# Patient Record
Sex: Male | Born: 1980 | Race: White | Hispanic: No | Smoking: Current every day smoker
Health system: Southern US, Community
[De-identification: ages and names within clinical notes are randomized; demographics above are authoritative.]

## PROBLEM LIST (undated history)

## (undated) DIAGNOSIS — B019 Varicella without complication: Secondary | ICD-10-CM

## (undated) DIAGNOSIS — E119 Type 2 diabetes mellitus without complications: Secondary | ICD-10-CM

## (undated) DIAGNOSIS — I2510 Atherosclerotic heart disease of native coronary artery without angina pectoris: Secondary | ICD-10-CM

## (undated) DIAGNOSIS — E7800 Pure hypercholesterolemia, unspecified: Secondary | ICD-10-CM

## (undated) DIAGNOSIS — I639 Cerebral infarction, unspecified: Secondary | ICD-10-CM

## (undated) DIAGNOSIS — Z720 Tobacco use: Secondary | ICD-10-CM

## (undated) DIAGNOSIS — I2119 ST elevation (STEMI) myocardial infarction involving other coronary artery of inferior wall: Secondary | ICD-10-CM

## (undated) HISTORY — DX: Tobacco use: Z72.0

## (undated) HISTORY — PX: CORONARY ANGIOPLASTY WITH STENT PLACEMENT: SHX49

## (undated) HISTORY — DX: Varicella without complication: B01.9

## (undated) HISTORY — DX: Atherosclerotic heart disease of native coronary artery without angina pectoris: I25.10

---
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
## 2004-11-02 ENCOUNTER — Emergency Department: Payer: Self-pay | Admitting: Emergency Medicine

## 2005-04-06 ENCOUNTER — Emergency Department: Payer: Self-pay | Admitting: Emergency Medicine

## 2005-04-07 ENCOUNTER — Other Ambulatory Visit: Payer: Self-pay

## 2006-05-21 ENCOUNTER — Emergency Department: Payer: Self-pay | Admitting: Emergency Medicine

## 2006-05-23 ENCOUNTER — Emergency Department: Payer: Self-pay | Admitting: Emergency Medicine

## 2006-10-12 ENCOUNTER — Emergency Department: Payer: Self-pay | Admitting: Emergency Medicine

## 2007-05-28 ENCOUNTER — Emergency Department (HOSPITAL_COMMUNITY): Admission: EM | Admit: 2007-05-28 | Discharge: 2007-05-28 | Payer: Self-pay | Admitting: Emergency Medicine

## 2008-02-16 ENCOUNTER — Emergency Department: Payer: Self-pay | Admitting: Emergency Medicine

## 2008-07-30 ENCOUNTER — Emergency Department: Payer: Self-pay | Admitting: Emergency Medicine

## 2008-10-24 ENCOUNTER — Emergency Department: Payer: Self-pay | Admitting: Emergency Medicine

## 2008-12-26 ENCOUNTER — Emergency Department: Payer: Self-pay | Admitting: Emergency Medicine

## 2010-07-22 ENCOUNTER — Emergency Department: Payer: Self-pay | Admitting: Unknown Physician Specialty

## 2010-09-22 ENCOUNTER — Emergency Department: Payer: Self-pay | Admitting: *Deleted

## 2010-10-29 ENCOUNTER — Emergency Department: Payer: Self-pay | Admitting: *Deleted

## 2010-11-01 ENCOUNTER — Emergency Department: Payer: Self-pay | Admitting: Emergency Medicine

## 2012-02-07 ENCOUNTER — Emergency Department: Payer: Self-pay | Admitting: Emergency Medicine

## 2012-02-11 LAB — WOUND CULTURE

## 2012-08-06 ENCOUNTER — Emergency Department: Payer: Self-pay | Admitting: Emergency Medicine

## 2012-08-08 ENCOUNTER — Observation Stay: Payer: Self-pay | Admitting: Surgery

## 2012-08-08 LAB — CREATININE, SERUM
EGFR (African American): >60
EGFR (Non-African Amer.): >60

## 2012-08-12 LAB — WOUND CULTURE

## 2012-08-28 ENCOUNTER — Encounter (HOSPITAL_COMMUNITY)
Admission: EM | Disposition: A | Discharge status: Home / Self Care | Payer: Self-pay | Source: Ambulatory Visit | Attending: Cardiology

## 2012-08-28 ENCOUNTER — Inpatient Hospital Stay (HOSPITAL_COMMUNITY)
Admission: EM | Admit: 2012-08-28 | Discharge: 2012-08-30 | DRG: 249 | Disposition: A | Discharge status: Home / Self Care | Payer: Medicaid Other | Source: Ambulatory Visit | Attending: Cardiology | Admitting: Cardiology

## 2012-08-28 ENCOUNTER — Observation Stay (HOSPITAL_COMMUNITY): Admission: EM | Admit: 2012-08-28 | Payer: Self-pay | Source: Ambulatory Visit | Admitting: Cardiology

## 2012-08-28 ENCOUNTER — Encounter (HOSPITAL_COMMUNITY): Payer: Self-pay | Admitting: *Deleted

## 2012-08-28 DIAGNOSIS — Z7982 Long term (current) use of aspirin: Secondary | ICD-10-CM

## 2012-08-28 DIAGNOSIS — I213 ST elevation (STEMI) myocardial infarction of unspecified site: Secondary | ICD-10-CM

## 2012-08-28 DIAGNOSIS — I2510 Atherosclerotic heart disease of native coronary artery without angina pectoris: Secondary | ICD-10-CM | POA: Diagnosis present

## 2012-08-28 DIAGNOSIS — Z955 Presence of coronary angioplasty implant and graft: Secondary | ICD-10-CM

## 2012-08-28 DIAGNOSIS — E669 Obesity, unspecified: Secondary | ICD-10-CM | POA: Diagnosis present

## 2012-08-28 DIAGNOSIS — Z79899 Other long term (current) drug therapy: Secondary | ICD-10-CM

## 2012-08-28 DIAGNOSIS — IMO0001 Reserved for inherently not codable concepts without codable children: Secondary | ICD-10-CM | POA: Diagnosis present

## 2012-08-28 DIAGNOSIS — F17200 Nicotine dependence, unspecified, uncomplicated: Secondary | ICD-10-CM | POA: Diagnosis present

## 2012-08-28 DIAGNOSIS — E782 Mixed hyperlipidemia: Secondary | ICD-10-CM | POA: Diagnosis present

## 2012-08-28 DIAGNOSIS — I2119 ST elevation (STEMI) myocardial infarction involving other coronary artery of inferior wall: Principal | ICD-10-CM

## 2012-08-28 HISTORY — DX: ST elevation (STEMI) myocardial infarction involving other coronary artery of inferior wall: I21.19

## 2012-08-28 HISTORY — PX: LEFT HEART CATHETERIZATION WITH CORONARY ANGIOGRAM: SHX5451

## 2012-08-28 LAB — HEMOGLOBIN A1C
Hgb A1c MFr Bld: 11.5 % — ABNORMAL HIGH (ref <5.7)
Hgb A1c MFr Bld: 11.5 % — ABNORMAL HIGH (ref <5.7)
Mean Plasma Glucose: 283 mg/dL — ABNORMAL HIGH (ref <117)

## 2012-08-28 LAB — POCT ACTIVATED CLOTTING TIME: Activated Clotting Time: >1000 seconds

## 2012-08-28 LAB — TROPONIN I: Troponin I: >20 ng/mL

## 2012-08-28 LAB — POCT I-STAT, CHEM 8
BUN: 7 mg/dL (ref 6–23)
Calcium, Ion: 1.13 mmol/L (ref 1.12–1.23)
Chloride: 98 mEq/L (ref 96–112)
Creatinine, Ser: 0.7 mg/dL (ref 0.50–1.35)
Glucose, Bld: 402 mg/dL — ABNORMAL HIGH (ref 70–99)
HCT: 46 % (ref 39.0–52.0)
Hemoglobin: 15.6 g/dL (ref 13.0–17.0)
Potassium: 3.1 mEq/L — ABNORMAL LOW (ref 3.5–5.1)
Sodium: 136 mEq/L (ref 135–145)
TCO2: 23 mmol/L (ref 0–100)

## 2012-08-28 LAB — COMPREHENSIVE METABOLIC PANEL
ALT: 15 U/L (ref 0–53)
AST: 11 U/L (ref 0–37)
Albumin: 3.3 g/dL — ABNORMAL LOW (ref 3.5–5.2)
BUN: 9 mg/dL (ref 6–23)
Calcium: 8.5 mg/dL (ref 8.4–10.5)
Creatinine, Ser: 0.64 mg/dL (ref 0.50–1.35)
Sodium: 132 mEq/L — ABNORMAL LOW (ref 135–145)
Total Bilirubin: 0.3 mg/dL (ref 0.3–1.2)
Total Protein: 5.8 g/dL — ABNORMAL LOW (ref 6.0–8.3)

## 2012-08-28 LAB — LIPID PANEL
Cholesterol: 149 mg/dL (ref 0–200)
Cholesterol: 153 mg/dL (ref 0–200)
Total CHOL/HDL Ratio: 6.8 RATIO
Triglycerides: 282 mg/dL — ABNORMAL HIGH (ref <150)
Triglycerides: 493 mg/dL — ABNORMAL HIGH (ref <150)
VLDL: 56 mg/dL — ABNORMAL HIGH (ref 0–40)

## 2012-08-28 LAB — CBC
MCH: 31.5 pg (ref 26.0–34.0)
MCHC: 35.7 g/dL (ref 30.0–36.0)
MCV: 88.3 fL (ref 78.0–100.0)
Platelets: 196 10*3/uL (ref 150–400)
RBC: 4.95 MIL/uL (ref 4.22–5.81)

## 2012-08-28 LAB — GLUCOSE, CAPILLARY
Glucose-Capillary: 358 mg/dL — ABNORMAL HIGH (ref 70–99)
Glucose-Capillary: 312 mg/dL — ABNORMAL HIGH (ref 70–99)

## 2012-08-28 LAB — TSH: TSH: 0.25 u[IU]/mL — ABNORMAL LOW (ref 0.350–4.500)

## 2012-08-28 LAB — MRSA PCR SCREENING: MRSA by PCR: NEGATIVE

## 2012-08-28 SURGERY — LEFT HEART CATHETERIZATION WITH CORONARY ANGIOGRAM
Anesthesia: LOCAL

## 2012-08-28 MED ORDER — SODIUM CHLORIDE 0.9 % IJ SOLN
3.0000 mL | Freq: Two times a day (BID) | INTRAMUSCULAR | Status: DC
  Administered 2012-08-29 (×2): 3 mL via INTRAVENOUS

## 2012-08-28 MED ORDER — VERAPAMIL HCL 2.5 MG/ML IV SOLN
INTRAVENOUS | Status: AC
  Filled 2012-08-28: qty 2

## 2012-08-28 MED ORDER — NITROGLYCERIN 0.2 MG/ML ON CALL CATH LAB
INTRAVENOUS | Status: AC
  Filled 2012-08-28: qty 1

## 2012-08-28 MED ORDER — SODIUM CHLORIDE 0.9 % IJ SOLN: 3.0000 mL | INTRAMUSCULAR | Status: DC | PRN

## 2012-08-28 MED ORDER — NITROGLYCERIN 0.4 MG SL SUBL: 0.4000 mg | SUBLINGUAL_TABLET | SUBLINGUAL | Status: DC | PRN

## 2012-08-28 MED ORDER — ATORVASTATIN CALCIUM 80 MG PO TABS
80.0000 mg | ORAL_TABLET | Freq: Every day | ORAL | Status: DC
  Administered 2012-08-28 – 2012-08-29 (×2): 80 mg via ORAL
  Filled 2012-08-28 (×3): qty 1

## 2012-08-28 MED ORDER — INSULIN ASPART 100 UNIT/ML ~~LOC~~ SOLN
0.0000 [IU] | Freq: Three times a day (TID) | SUBCUTANEOUS | Status: DC
  Administered 2012-08-28: 9 [IU] via SUBCUTANEOUS
  Administered 2012-08-29: 5 [IU] via SUBCUTANEOUS
  Administered 2012-08-29: 3 [IU] via SUBCUTANEOUS
  Administered 2012-08-29: 9 [IU] via SUBCUTANEOUS
  Administered 2012-08-30: 3 [IU] via SUBCUTANEOUS

## 2012-08-28 MED ORDER — SODIUM CHLORIDE 0.9 % IV SOLN: INTRAVENOUS | Status: AC

## 2012-08-28 MED ORDER — ONDANSETRON HCL 4 MG/2ML IJ SOLN: 4.0000 mg | Freq: Four times a day (QID) | INTRAMUSCULAR | Status: DC | PRN

## 2012-08-28 MED ORDER — ZOLPIDEM TARTRATE 5 MG PO TABS
5.0000 mg | ORAL_TABLET | Freq: Every evening | ORAL | Status: DC | PRN
  Administered 2012-08-28: 5 mg via ORAL
  Filled 2012-08-28: qty 1

## 2012-08-28 MED ORDER — BIVALIRUDIN 250 MG IV SOLR
INTRAVENOUS | Status: AC
  Filled 2012-08-28: qty 250

## 2012-08-28 MED ORDER — ASPIRIN EC 81 MG PO TBEC
81.0000 mg | DELAYED_RELEASE_TABLET | Freq: Every day | ORAL | Status: DC
  Administered 2012-08-29 – 2012-08-30 (×2): 81 mg via ORAL
  Filled 2012-08-28 (×2): qty 1

## 2012-08-28 MED ORDER — METOPROLOL TARTRATE 25 MG PO TABS
25.0000 mg | ORAL_TABLET | Freq: Two times a day (BID) | ORAL | Status: DC
  Administered 2012-08-28 – 2012-08-30 (×5): 25 mg via ORAL
  Filled 2012-08-28 (×6): qty 1

## 2012-08-28 MED ORDER — SODIUM CHLORIDE 0.9 % IV SOLN: 250.0000 mL | INTRAVENOUS | Status: DC | PRN

## 2012-08-28 MED ORDER — HEPARIN (PORCINE) IN NACL 2-0.9 UNIT/ML-% IJ SOLN
INTRAMUSCULAR | Status: AC
  Filled 2012-08-28: qty 2000

## 2012-08-28 MED ORDER — HEPARIN SODIUM (PORCINE) 1000 UNIT/ML IJ SOLN
INTRAMUSCULAR | Status: AC
  Filled 2012-08-28: qty 1

## 2012-08-28 MED ORDER — ACETAMINOPHEN 325 MG PO TABS: 650.0000 mg | ORAL_TABLET | ORAL | Status: DC | PRN

## 2012-08-28 MED ORDER — ASPIRIN 300 MG RE SUPP
300.0000 mg | RECTAL | Status: AC
  Filled 2012-08-28: qty 1

## 2012-08-28 MED ORDER — ASPIRIN 81 MG PO CHEW: 324.0000 mg | CHEWABLE_TABLET | ORAL | Status: AC

## 2012-08-28 MED ORDER — PNEUMOCOCCAL VAC POLYVALENT 25 MCG/0.5ML IJ INJ
0.5000 mL | INJECTION | INTRAMUSCULAR | Status: AC
  Administered 2012-08-29: 0.5 mL via INTRAMUSCULAR
  Filled 2012-08-28: qty 0.5

## 2012-08-28 MED ORDER — CLOPIDOGREL BISULFATE 75 MG PO TABS
75.0000 mg | ORAL_TABLET | Freq: Every day | ORAL | Status: DC
  Administered 2012-08-29: 75 mg via ORAL
  Filled 2012-08-28: qty 1

## 2012-08-28 MED ORDER — ACETAMINOPHEN 325 MG PO TABS
650.0000 mg | ORAL_TABLET | ORAL | Status: DC | PRN
  Administered 2012-08-28: 650 mg via ORAL
  Filled 2012-08-28: qty 2

## 2012-08-28 MED ORDER — NICOTINE 21 MG/24HR TD PT24
21.0000 mg | MEDICATED_PATCH | Freq: Every day | TRANSDERMAL | Status: DC
  Administered 2012-08-28 – 2012-08-30 (×4): 21 mg via TRANSDERMAL
  Filled 2012-08-28 (×3): qty 1

## 2012-08-28 MED ORDER — LIDOCAINE HCL (PF) 1 % IJ SOLN
INTRAMUSCULAR | Status: AC
  Filled 2012-08-28: qty 30

## 2012-08-28 NOTE — Progress Notes (Signed)
Met pt's wife in ED. Pt was in cath lab w/heart attack. Stayed w/wife until cath was complete. Wife was very tearful and making calls. Will refer to unit Chaplain. Marjory Lies Chaplain  08/28/12 0800  Clinical Encounter Type  Visited With Family

## 2012-08-28 NOTE — H&P (Signed)
Martin Mullen is an 32 y.o. male.   Chief Complaint: Chest pain that started around 6 AM this morning. HPI: Patient is a 32 year old Caucasian male with history of polysubstance abuse, who states that he's been clean for the past one month from use of cocaine and marijuana, was doing well until this morning when he woke up with severe retrosternal chest pain with radiation to the left arm. Associated with diaphoresis. EMS was activated and they found him to be having an acute ST elevation myocardial infarction. He was emergently transferred to [facility] for primary PCI. Patient continues to have significant chest discomfort.  No past medical history on file. No history of hypertension, diabetes, hyperlipidemia.  No past surgical history on file. No significant past surgical history.  No family history on file. There is no history of premature coronary artery disease.  Social History:  has no tobacco, alcohol, and drug history on file. Smokes about a pack to pack and half of cigarettes a day. Used to use cocaine and has 30 days ago. Occasional alcohol use.  Allergies: No Known Allergies  Review of Systems - Negative except Chest pain, nausea and sweating. No history this is clearly of claudication. Other systems negative.  There were no vitals taken for this visit. General appearance: alert, cooperative, appears older than stated age, mild distress and mildly obese Eyes: negative findings: lids and lashes normal and conjunctivae and sclerae normal Neck: no adenopathy, no carotid bruit, no JVD, supple, symmetrical, trachea midline and thyroid not enlarged, symmetric, no tenderness/mass/nodules Neck: JVP - normal, carotids 2+= without bruits Resp: clear to auscultation bilaterally Chest wall: no tenderness Cardio: regular rate and rhythm, S1, S2 normal, no murmur, click, rub or gallop GI: soft, non-tender; bowel sounds normal; no masses,  no organomegaly Extremities: extremities  normal, atraumatic, no cyanosis or edema Pulses: 2+ and symmetric Skin: Skin color, texture, turgor normal. No rashes or lesions Neurologic: Grossly normal  Results for orders placed during the hospital encounter of 08/28/12 (from the past 48 hour(s))  CBC     Status: Abnormal   Collection Time    08/28/12  7:35 AM      Result Value Range   WBC 13.4 (*) 4.0 - 10.5 K/uL   RBC 4.95  4.22 - 5.81 MIL/uL   Hemoglobin 15.6  13.0 - 17.0 g/dL   HCT 16.1  09.6 - 04.5 %   MCV 88.3  78.0 - 100.0 fL   MCH 31.5  26.0 - 34.0 pg   MCHC 35.7  30.0 - 36.0 g/dL   RDW 40.9  81.1 - 91.4 %   Platelets 196  150 - 400 K/uL   No results found.  Labs:   Lab Results  Component Value Date   WBC 13.4* 08/28/2012   HGB 15.6 08/28/2012   HCT 43.7 08/28/2012   MCV 88.3 08/28/2012   PLT 196 08/28/2012   No results found for this basename: NA, K, CL, CO2, BUN, CREATININE, CALCIUM, LABALBU, PROT, BILITOT, ALKPHOS, ALT, AST, GLUCOSE,  in the last 168 hours No results found for this basename: CKTOTAL, CKMB, CKMBINDEX, TROPONINI    Lipid Panel  No results found for this basename: chol, trig, hdl, cholhdl, vldl, ldlcalc    EKG: EKG done in the field at 649:38 hours reveal ST elevation of 2 mm in the inferior leads with reciprocal ST depression in aVL and 1. Acute inferior myocardial injury pattern..   Assessment/Plan 1. Acute ST elevation myocardial infarction involving the inferior  wall. Patient will be taken emergently to the cardiac catheterization lab for primary PCI.  Pamella Pert, MD 08/28/2012, 8:15 AM [facility] Pager: [phone] Office: [phone] If no answer: Cell:  [phone]

## 2012-08-28 NOTE — CV Procedure (Addendum)
Procedure performed:  Left heart catheterization including hemodynamic monitoring of the left ventricle, LV gram. Selective right and left coronary arteriography. PTCA and stent of the distal RCA with implantation of a 3.5 x 12 mm very flex bare-metal stent.  Indication: Patient is a 32 year-old male  Admitted with acute inferior wall myocardial infarction with ST elevations inferiorly. Patient underwent emergent cardiac catheterization.  Hemodynamic data: Left ventricular pressure was 110/11 with LVEDP of 21 mm mercury. Aortic pressure was 111/79 with a mean of 95 mm mercury. There was no pressure gradient across the aortic valve.   Left ventricle: Performed in the RAO projection revealed LVEF of 55%. There was No MR.  Mild basal inferior hypokinesis.  Right coronary artery: The vessel is smooth, Dominant. 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 It gives origin to large PDA and PL branch.  Left main coronary artery is large and normal.  Circumflex coronary artery: A large vessel, smooth with a midsegment diffuse 20% stenosis. Mild ectasia noted in the proximal to midsegment.  LAD:  LAD gives origin to a large diagonal-1 which has a mid 90% stenosis, which appears to be ulcerated, it's a 2.25-2.5 mm vessel.Marland Kitchen  LAD has mild diffuse luminal irregularities. Midsegment of the LAD shows a diffuse 30-40% stenosis. Distal LAD shows diffuse 20% stenosis.  Ramus intermediate: Is a moderate to large caliber vessel, smooth and normal.  Impression: Acute inferior wall MI with culprit vessel being distal RCA.  Interventional data: Successful PTCA and stenting of the distal RCA with implantation of a Non-DES 3.5 x 12 mm Veri-flex stent.  Will need Dual antiplatelet therapy with Plavix and ASA 81 mg for at least one year.   Technique of diagnostic cardiac catheterization:  Under sterile precautions using a 6 French right radial  arterial access, a 6 French  sheath was introduced into the right radial artery.A 6 F JR 4 guide catheter was advanced into the ascending aorta selective  right coronary arterywas cannulated and angiography was performed in multiple views. The catheter was pulled back Out of the body over exchange length J-wire. I had great difficulty in engaging the right coronary artery with the guide catheter. Hence I exchanged to a AR-1 and then eventually a AL-0.75 catheter to engage the right coronary artery. Proceeded with intervention to the right coronary artery prior to doing the diagnostic on the left coronary arterial system. A cougar guidewire was advanced into the right coronary artery and the tip of the wire was carefully positioned into the distal RCA. I used a 2.5 x 15 mm emerge balloon and balloon angioplasty at 10 atmospheric pressure was performed for 60. Intracoronary nitroglycerin was administered. Then I proceeded with implantation of a 3.5 x 12 mm veri flex non-drug-eluting stent at the distal RCA which was deployed at 9 atmospheric pressure for 45 seconds followed by post dilatation with a 3.5 x 8 mm Sol[employer] Quantum apex at 14 and 16 atmospheric pressure for 30 seconds each throughout the stented segment. Intracoronary nitroglycerin was administered and angiography was performed. Excellent result was evident without any edge dissection good TIMI-3 flow. The guidewire was withdrawn angiography repeated and the same guide cath was utilized to perform left ventricle gram in the RAO projection without any difficulty and also the same catheter was utilized to engage the left main coronary artery and angiography was performed. Catheter exchanged out of the body over J-Wire. NO immediate complications noted. Hemostasis achieved with TR band. Patient  tolerated the procedure well.  During the initial guide catheter manipulation, with the JR 4, there was a significant kink at the right radial/brachial artery site hence I had difficulty in removal of  the guide catheter in advancing any kind of wire through this. However I was able to successfully manipulate this with the help of a Glidewire and manipulation of the guide catheter. Cath exchanges were done over J-wire after this.  Disposition: Patient will be discharged in 24-48 hours unless complications with out-patient follow up. I plan on doing aggressive medical therapy for his diagonal disease and I am not sure that he will take his medications due to compliance issues hence do not want to use a small non-drug-eluting stent into the diagonal lesion. Patient did admit that he will take the medications but he cannot afford the medications are expensive. I do not plan on bringing him back to fix the diagonal lesion unless he has recurrence of chest pain or an outpatient stress test may be considered to evaluate the significance and his functional status.

## 2012-08-28 NOTE — Care Management Note (Addendum)
    Page 1 of 2   08/30/2012     10:10:34 AM   CARE MANAGEMENT NOTE 08/30/2012  Patient:  Martin Mullen,Martin Mullen   Account Number:  [id number]  Date Initiated:  08/28/2012  Documentation initiated by:  Junius Creamer  Subjective/Objective Assessment:   adm w mi     Action/Plan:   lives w wife,no ins at present   Anticipated DC Date:     Anticipated DC Plan:  HOME/SELF CARE      DC Planning Services  CM consult  Follow-up appt scheduled      Choice offered to / List presented to:             Status of service:  Completed, signed off Medicare Important Message given?   (If response is "NO", the following Medicare IM given date fields will be blank) Date Medicare IM given:   Date Additional Medicare IM given:    Discharge Disposition:  HOME/SELF CARE  Per UR Regulation:  Reviewed for med. necessity/level of care/duration of stay  If discussed at Long Length of Stay Meetings, dates discussed:    Comments:  08-30-12 [address] RN,BSN [phone] CM did speak to pt about the [place] forms. Pt will fax forms to company for assistance. CM did speak to MD Gangi about cholesterol med and for this month will be a cheaper statin at [facility] and then changing to crestor to receive via [employer]. Pt was given the information to Karin Golden for free diabetic po medications and the [facility] 4.00 med list. Pt states he will be able ot afford meds. No need for [facility] program at this time.  7/16  1647P DEBBIE DOWELL RN,BSN [facility] emailed me back and appt sched on 09-05-12 at 1pm, have placed on dc instruct sheet.  7/15 1144a debbie dowell rn,bsn adm w mi. will be on plavix and asa prob 1 yr. gave pt 2 prescription dscount cards for plavix. gave pt 2 other discoundt cards for brand name meds. left pt primary care resourse list and pt agreeable to trying to get appt w [facility]. cell numbers for pt [phone] or [phone], home # [phone]. will give  these to [facility] so they can try and set up appt for pt. can do match letter at disch so pt can get 34days of meds for 3.00 ea 1x per year to help out when first leave hospital. md please write 2 sets of prescriptions 1 for 34days and another w refills for 30days.

## 2012-08-29 ENCOUNTER — Encounter (HOSPITAL_COMMUNITY): Payer: Self-pay | Admitting: Cardiology

## 2012-08-29 LAB — GLUCOSE, CAPILLARY: Glucose-Capillary: 298 mg/dL — ABNORMAL HIGH (ref 70–99)

## 2012-08-29 LAB — BASIC METABOLIC PANEL
BUN: 9 mg/dL (ref 6–23)
CO2: 25 mEq/L (ref 19–32)
Calcium: 9 mg/dL (ref 8.4–10.5)
Chloride: 105 mEq/L (ref 96–112)
Glucose, Bld: 333 mg/dL — ABNORMAL HIGH (ref 70–99)
Potassium: 3.7 mEq/L (ref 3.5–5.1)
Sodium: 138 mEq/L (ref 135–145)

## 2012-08-29 LAB — CBC
HCT: 44.7 % (ref 39.0–52.0)
Hemoglobin: 16.2 g/dL (ref 13.0–17.0)
MCHC: 36.2 g/dL — ABNORMAL HIGH (ref 30.0–36.0)
Platelets: 182 10*3/uL (ref 150–400)
RBC: 4.96 MIL/uL (ref 4.22–5.81)
WBC: 12.2 10*3/uL — ABNORMAL HIGH (ref 4.0–10.5)

## 2012-08-29 LAB — T4, FREE: Free T4: 1.18 ng/dL (ref 0.80–1.80)

## 2012-08-29 LAB — T3, FREE: T3, Free: 3.5 pg/mL (ref 2.3–4.2)

## 2012-08-29 MED ORDER — LIVING WELL WITH DIABETES BOOK
Freq: Once | Status: AC
  Administered 2012-08-29: 11:00:00
  Filled 2012-08-29: qty 1

## 2012-08-29 MED ORDER — TRAMADOL HCL 50 MG PO TABS: 50.0000 mg | ORAL_TABLET | Freq: Four times a day (QID) | ORAL | Status: DC

## 2012-08-29 MED ORDER — TRAMADOL HCL 50 MG PO TABS
50.0000 mg | ORAL_TABLET | Freq: Four times a day (QID) | ORAL | Status: DC | PRN
  Administered 2012-08-29: 50 mg via ORAL
  Filled 2012-08-29: qty 1

## 2012-08-29 MED ORDER — GLYBURIDE 2.5 MG PO TABS
2.5000 mg | ORAL_TABLET | Freq: Two times a day (BID) | ORAL | Status: DC
  Administered 2012-08-29 – 2012-08-30 (×2): 2.5 mg via ORAL
  Filled 2012-08-29 (×4): qty 1

## 2012-08-29 MED ORDER — METFORMIN HCL 500 MG PO TABS
500.0000 mg | ORAL_TABLET | Freq: Two times a day (BID) | ORAL | Status: DC
  Filled 2012-08-29 (×2): qty 1

## 2012-08-29 MED ORDER — TICAGRELOR 90 MG PO TABS
90.0000 mg | ORAL_TABLET | Freq: Two times a day (BID) | ORAL | Status: DC
  Administered 2012-08-29 – 2012-08-30 (×3): 90 mg via ORAL
  Filled 2012-08-29 (×4): qty 1

## 2012-08-29 MED ORDER — METFORMIN HCL 500 MG PO TABS
500.0000 mg | ORAL_TABLET | Freq: Two times a day (BID) | ORAL | Status: DC
  Administered 2012-08-30: 500 mg via ORAL
  Filled 2012-08-29 (×3): qty 1

## 2012-08-29 NOTE — Plan of Care (Signed)
Problem: Food- and Nutrition-Related Knowledge Deficit (NB-1.1) Goal: Nutrition education Formal process to instruct or train a patient/client in a skill or to impart knowledge to help patients/clients voluntarily manage or modify food choices and eating behavior to maintain or improve health.  Outcome: Progressing  RD consulted for nutrition education regarding diabetes.     Lab Results  Component Value Date    HGBA1C 11.5* 08/28/2012    RD provided "Carbohydrate Counting for People with Diabetes" handout from the Academy of Nutrition and Dietetics. Discussed different food groups and their effects on blood sugar, emphasizing carbohydrate-containing foods. Provided list of carbohydrates and recommended serving sizes of common foods.  Discussed importance of controlled and consistent carbohydrate intake throughout the day. Provided examples of ways to balance meals/snacks and encouraged intake of high-fiber, whole grain complex carbohydrates. Also briefly discussed Hearty Healthy principles and how they compliment diabetes management.  Provided pt and wife with "Heart Healthy Nutrition Therapy" handout. Teach back method used.  Expect good compliance.  Body mass index is 34.07 kg/(m^2). Pt meets criteria for obese class 1 based on current BMI.  Current diet order is CHO Mod Med/Heart Healthy, patient is consuming approximately 100% of meals at this time. Labs and medications reviewed. No further nutrition interventions warranted at this time. RD contact information provided. If additional nutrition issues arise, please re-consult RD.  Loyce Dys, MS RD LDN Clinical Inpatient Dietitian Pager: [phone] Weekend/After hours pager: [phone]

## 2012-08-29 NOTE — Progress Notes (Addendum)
Subjective:  No further chest pain. Patient feels well.  Objective:  Vital Signs in the last 24 hours: Temp:  [97.8 F (36.6 C)-98.7 F (37.1 C)] 98 F (36.7 C) (07/16 1600) Pulse Rate:  [61-67] 61 (07/16 1600) Resp:  [12-18] 16 (07/16 1600) BP: (81-115)/(42-78) 106/68 mmHg (07/16 1600) SpO2:  [95 %-99 %] 99 % (07/16 1600)  Intake/Output from previous day: 07/15 0701 - 07/16 0700 In: 2280 [P.O.:1680; I.V.:600] Out: -   Physical Exam:  General appearance: alert, cooperative, appears older than stated age, mild distress and mildly obese  Eyes: negative findings: lids and lashes normal and conjunctivae and sclerae normal  Neck: no adenopathy, no carotid bruit, no JVD, supple, symmetrical, trachea midline and thyroid not enlarged, symmetric, no tenderness/mass/nodules  Neck: JVP - normal, carotids 2+= without bruits  Resp: clear to auscultation bilaterally  Chest wall: no tenderness  Cardio: regular rate and rhythm, S1, S2 normal, no murmur, click, rub or gallop  GI: soft, non-tender; bowel sounds normal; no masses, no organomegaly  Extremities: extremities normal, atraumatic, no cyanosis or edema  Pulses: 2+ and symmetric. Right radial arterial access site without any hematoma. Good pulse present. Skin: Skin color, texture, turgor normal. No rashes or lesions  Neurologic: Grossly normal   Lab Results:  Recent Labs  08/28/12 0735 08/28/12 0738 08/29/12 0500  WBC 13.4*  --  12.2*  HGB 15.6 15.6 16.2  PLT 196  --  182    Recent Labs  08/28/12 0735 08/28/12 0738 08/29/12 0500  NA 132* 136 138  K 2.9* 3.1* 3.7  CL 95* 98 105  CO2 23  --  25  GLUCOSE 411* 402* 333*  BUN 9 7 9   CREATININE 0.64 0.70 0.58    Recent Labs  08/28/12 1750 08/28/12 2235  TROPONINI >20.00* 17.63*   Hepatic Function Panel  Recent Labs  08/28/12 0735  PROT 5.8*  ALBUMIN 3.3*  AST 11  ALT 15  ALKPHOS 116  BILITOT 0.3    Recent Labs  08/28/12 1115  CHOL 153   No results  found for this basename: PROTIME,  in the last 72 hours Lipid Panel     Component Value Date/Time   CHOL 153 08/28/2012 1115   TRIG 493* 08/28/2012 1115   HDL 21* 08/28/2012 1115   CHOLHDL 7.3 08/28/2012 1115   VLDL UNABLE TO CALCULATE IF TRIGLYCERIDE OVER 400 mg/dL 05/23/8117 1478   LDLCALC UNABLE TO CALCULATE IF TRIGLYCERIDE OVER 400 mg/dL 2/95/6213 0865   Cardiac Panel (last 3 results)  Recent Labs  08/28/12 0735 08/28/12 1115 08/28/12 1750 08/28/12 2235  CKTOTAL 35  --   --   --   CKMB 1.0  --   --   --   TROPONINI <0.30 6.26* >20.00* 17.63*  RELINDX RELATIVE INDEX IS INVALID  --   --   --     EKG: normal EKG, normal sinus rhythm.   Assessment/Plan:  1. Acute inferior wall myocardial infarction status post PTCA and stenting of the distal RCA with implantation of a 3.5 x 12 mm ultra non-drug-eluting stent. 2. Mixed hyperlipidemia 3. Diabetes mellitus type 2, new onset, uncontrolled 4. Polysubstance abuse, history of cocaine use, accident for the past one month, tobacco use disorder.  Recommendation: I have started the patient on Micronase 2.5 mg by mouth twice a day and metformin 500 by mouth twice a day. Will continue present medications and transfer the patient with telemetry. I suspect discharge in 24-48 hours. Changed from Plavix to  BRILINTA due to patient being indigent and I should be able to obtain free medications from drug assistance program from [employer].    Pamella Pert, M.D. 08/29/2012, 7:00 PM [facility] Pager: [phone] Office: [phone] If no answer: [phone]

## 2012-08-29 NOTE — Progress Notes (Signed)
CARDIAC REHAB PHASE I   PRE:  Rate/Rhythm: 77 SR  BP:  Supine:   Sitting: 109/78  Standing:    SaO2:   MODE:  Ambulation: 700 ft   POST:  Rate/Rhythm: 80 SR  BP:  Supine:   Sitting: 108/68  Standing:    SaO2:  1135-1255  Pt tolerated ambulation well wthtout c/o of cp or SOB. VS stable. Pt to side of bed after walk. Started MI, stent and diabetic education with pt and wife. They voice understanding. Encouraged pt and wife to watch MI and diabetic videos. Discussed smoking cessation with pt. I gave him tips for quitting and coaching contact number. He seems somewhat motivated to quit. Pt's wife very supportive and encouraging. We will continue to follow pt to continue ambulation and education.  Melina Copa RN 08/29/2012 12:53 PM

## 2012-08-29 NOTE — Progress Notes (Addendum)
Inpatient Diabetes Program Recommendations  AACE/ADA: New Consensus Statement on Inpatient Glycemic Control (2013)  Target Ranges:  Prepandial:   less than 140 mg/dL      Peak postprandial:   less than 180 mg/dL (1-2 hours)      Critically ill patients:  140 - 180 mg/dL  Results for Martin Mullen, Martin Mullen (MRN [id number]) as of 08/29/2012 10:44  Ref. Range 08/28/2012 08:35 08/28/2012 16:58 08/28/2012 21:31 08/29/2012 07:19  Glucose-Capillary Latest Range: 70-99 mg/dL 621 (H) 308 (H) 657 (H) 272 (H)    Inpatient Diabetes Program Recommendations Insulin - Basal: consider adding basal Lantus 20 units  ADDENDUM:  This coordinator spoke with patient and wife concerning new onset DM.  Patient is ready to learn and make lifestyle changes with diet and exercise.   Patient has not started to view the videos yet but has the list at the bedside.  Encouraged patient and wife to view DM videos on patient education network. Will follow. Thank you  Piedad Climes BSN, RN,CDE Inpatient Diabetes Coordinator [phone] (team pager)

## 2012-08-29 NOTE — Progress Notes (Signed)
Pt c/o 4/10 intermittent lt sided cp that is sharp. EKG & VS taken both WNL. MD Gangi notified. New orders for tramadol 50 mg q6 prn.Will continue to monitor the pt. Sanda Linger, RN

## 2012-08-30 MED ORDER — TICAGRELOR 90 MG PO TABS: 90.0000 mg | ORAL_TABLET | Freq: Two times a day (BID) | ORAL | Status: DC

## 2012-08-30 MED ORDER — NICOTINE 21 MG/24HR TD PT24: 30.0000 | MEDICATED_PATCH | Freq: Every day | TRANSDERMAL | Status: DC

## 2012-08-30 MED ORDER — METOPROLOL TARTRATE 25 MG PO TABS: 25.0000 mg | ORAL_TABLET | Freq: Two times a day (BID) | ORAL | Status: DC

## 2012-08-30 MED ORDER — GLYBURIDE 2.5 MG PO TABS: 2.5000 mg | ORAL_TABLET | Freq: Two times a day (BID) | ORAL | Status: DC

## 2012-08-30 MED ORDER — ASPIRIN 81 MG PO TBEC: 81.0000 mg | DELAYED_RELEASE_TABLET | Freq: Every day | ORAL | Status: DC

## 2012-08-30 MED ORDER — NITROGLYCERIN 0.4 MG SL SUBL: 0.4000 mg | SUBLINGUAL_TABLET | SUBLINGUAL | Status: DC | PRN

## 2012-08-30 MED ORDER — PRAVASTATIN SODIUM 40 MG PO TABS: ORAL_TABLET | ORAL | Status: DC

## 2012-08-30 MED ORDER — METFORMIN HCL 500 MG PO TABS: 500.0000 mg | ORAL_TABLET | Freq: Two times a day (BID) | ORAL | Status: DC

## 2012-08-30 NOTE — Progress Notes (Signed)
1000-1040 Cardiac Rehab Completed MI and stent education with pt and wife. They voice understanding. Pt agrees to [employer]. CRP in [place], will send referral. Melina Copa RN

## 2012-08-30 NOTE — Progress Notes (Signed)
D/c orders received; IV removed with gauze on, pt remains in stable condition, pt meds and instructions reviewed and given to pt; reviewed diabetes education, educated pt about life style changes; pt states he knows how to take blood sugar; pt d/c to home

## 2012-08-30 NOTE — Discharge Summary (Addendum)
Physician Discharge Summary  Patient ID: Martin Mullen MRN: [id number] DOB/AGE: 1980-06-02 32 y.o.  Admit date: 08/28/2012 Discharge date: 08/30/2012  Primary Discharge Diagnosis Acute inferior ST elevation myocardial infarction Secondary Discharge Diagnosis Hyperlipidemia, mixed Lipid profile 08/28/2012: Total cholesterol 153, producers 493, HDL 21, LDL unable to be calculated. Diabetes mellitus type 2 uncontrolled, new onset. TSH on 08/30/2011 was normal along with a free T3 and  FT4. HbA1c on 08/28/2012: 11.5%  Tobacco use disorder and polysubstance use with cocaine, adherent for the past one month  Significant Diagnostic Studies: 08/28/2012: Left ventricular pressure was 110/11 with LVEDP of 21 mm mercury. Aortic pressure was 111/79 with a mean of 95 mm mercury. There was no pressure gradient across the aortic valve.  Left ventricle: Performed in the RAO projection revealed LVEF of 55%. There was No MR. Mild basal inferior hypokinesis.  Right coronary artery: The vessel is smooth, Dominant. 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 It gives origin to large PDA and PL branch.  Left main coronary artery is large and normal.  Circumflex coronary artery: A large vessel, smooth with a midsegment diffuse 20% stenosis. Mild ectasia noted in the proximal to midsegment.  LAD: LAD gives origin to a large diagonal-1 which has a mid 90% stenosis, which appears to be ulcerated, it's a 2.25-2.5 mm vessel.Marland Kitchen LAD has mild diffuse luminal irregularities. Midsegment of the LAD shows a diffuse 30-40% stenosis. Distal LAD shows diffuse 20% stenosis.  Ramus intermediate: Is a moderate to large caliber vessel, smooth and normal.  Impression: Acute inferior wall MI with culprit vessel being distal RCA.  Interventional data: Successful PTCA and stenting of the distal RCA with implantation of a Non-DES 3.5 x 12 mm Veri-flex stent. Will need Dual antiplatelet therapy with  Plavix and ASA 81 mg for at least one year.   Hospital Course: Patient is a 32 year old Caucasian male with history of polysubstance abuse, who states that he's been clean for the past one month from use of cocaine and marijuana, was doing well until 08/28/2012 morning when he woke up with severe retrosternal chest pain with radiation to the left arm. Associated with diaphoresis. EMS was activated and they found him to be having an acute ST elevation myocardial infarction. He was emergently transferred to [facility] for primary PCI.  Patient underwent emergent cardiac catheterization and angioplasty to his right coronary artery with implantation of a non-drug-eluting stent. He was also found to have new onset of diabetes mellitus and mixed hyperlipidemia. He did well after angioplasty with complete resolution of symptoms of chest pain. After 2 days of hospital stay on the third day, patient was felt stable for discharge. Diabetes education in the outpatient basis was set up, drug assistance program was also set up with the help of case management, tobacco cessation was discussed with the patient along with dietary modification that needs to be done in front of his wife.   Recommendations on discharge: Patient will be discharged home on generic medications, will obtain Reunion and also Crestor from pharmaceutical company. Patient has been set up to see me in the office in 2 weeks. patient for temporal he has been started on pravastatin. Medical therapy for diagonal disease due to small vessel nature and compliance issues unless patient continues to have chest pain.   Discharge Exam: Blood pressure 107/71, pulse 71, temperature 97.7 F (36.5 C), temperature source Oral, resp. rate 18, height 5\' 10"  (1.778 m), weight 107.7 kg (  237 lb 7 oz), SpO2 98.00%.  General appearance: alert, cooperative, appears older than stated age, mild distress and mildly obese  Eyes: negative findings: lids and lashes  normal and conjunctivae and sclerae normal  Neck: no adenopathy, no carotid bruit, no JVD, supple, symmetrical, trachea midline and thyroid not enlarged, symmetric, no tenderness/mass/nodules  Neck: JVP - normal, carotids 2+= without bruits  Resp: clear to auscultation bilaterally  Chest wall: no tenderness  Cardio: regular rate and rhythm, S1, S2 normal, no murmur, click, rub or gallop  GI: soft, non-tender; bowel sounds normal; no masses, no organomegaly  Extremities: extremities normal, atraumatic, no cyanosis or edema  Pulses: 2+ and symmetric. Right radial arterial access site without any hematoma. Good pulse present.  Skin: Skin color, texture, turgor normal. No rashes or lesions  Neurologic: Grossly normal  Labs:   Lab Results  Component Value Date   WBC 12.2* 08/29/2012   HGB 16.2 08/29/2012   HCT 44.7 08/29/2012   MCV 90.1 08/29/2012   PLT 182 08/29/2012    Recent Labs Lab 08/28/12 0735  08/29/12 0500  NA 132*  < > 138  K 2.9*  < > 3.7  CL 95*  < > 105  CO2 23  --  25  BUN 9  < > 9  CREATININE 0.64  < > 0.58  CALCIUM 8.5  --  9.0  PROT 5.8*  --   --   BILITOT 0.3  --   --   ALKPHOS 116  --   --   ALT 15  --   --   AST 11  --   --   GLUCOSE 411*  < > 333*  < > = values in this interval not displayed. Lab Results  Component Value Date   CKTOTAL 35 08/28/2012   CKMB 1.0 08/28/2012   TROPONINI 17.63* 08/28/2012    Lipid Panel     Component Value Date/Time   CHOL 153 08/28/2012 1115   TRIG 493* 08/28/2012 1115   HDL 21* 08/28/2012 1115   CHOLHDL 7.3 08/28/2012 1115   VLDL UNABLE TO CALCULATE IF TRIGLYCERIDE OVER 400 mg/dL 1/61/0960 4540   LDLCALC UNABLE TO CALCULATE IF TRIGLYCERIDE OVER 400 mg/dL 9/81/1914 7829    Cardiac Panel (last 3 results)  Recent Labs  08/28/12 0735 08/28/12 1115 08/28/12 1750 08/28/12 2235  CKTOTAL 35  --   --   --   CKMB 1.0  --   --   --   TROPONINI <0.30 6.26* >20.00* 17.63*  RELINDX RELATIVE INDEX IS INVALID  --   --   --     TSH on 08/30/2011 was normal along with a free T3 and  FT4. HbA1c on 08/28/2012: 11.5%   EKG: 08/30/2012: Normal sinus rhythm, possible inferior infarct probably subacute with T wave inversion in infiltrates,  normal axis,  FOLLOW UP PLANS AND APPOINTMENTS Discharge Orders   Future Appointments Provider Department Dept Phone   09/05/2012 1:00 PM [facility] Covering Provider [facility] [phone]   Future Orders Complete By Expires     Amb Referral to Cardiac Rehabilitation  As directed     Comments:      Pt agrees to Outpt. CRP in  [place], will send referral.        Medication List         aspirin 81 MG EC tablet  Take 1 tablet (81 mg total) by mouth daily.     cephALEXin 500 MG capsule  Commonly  known as:  KEFLEX  Take 500 mg by mouth 4 (four) times daily. For 7 days     glyBURIDE 2.5 MG tablet  Commonly known as:  DIABETA  Take 1 tablet (2.5 mg total) by mouth 2 (two) times daily with a meal.     metFORMIN 500 MG tablet  Commonly known as:  GLUCOPHAGE  Take 1 tablet (500 mg total) by mouth 2 (two) times daily with a meal.     metoprolol tartrate 25 MG tablet  Commonly known as:  LOPRESSOR  Take 1 tablet (25 mg total) by mouth 2 (two) times daily.     nicotine 21 mg/24hr patch  Commonly known as:  NICODERM CQ - dosed in mg/24 hours  Place 30 patches onto the skin daily.     nitroGLYCERIN 0.4 MG SL tablet  Commonly known as:  NITROSTAT  Place 1 tablet (0.4 mg total) under the tongue every 5 (five) minutes x 3 doses as needed for chest pain.     oxyCODONE-acetaminophen 5-325 MG per tablet  Commonly known as:  PERCOCET/ROXICET  Take 2 tablets by mouth every 4 (four) hours as needed for pain.     pravastatin 40 MG tablet  Commonly known as:  PRAVACHOL  2 tablets in the evening after meals     Ticagrelor 90 MG Tabs tablet  Commonly known as:  BRILINTA  Take 1 tablet (90 mg total) by mouth 2 (two) times daily.            Follow-up Information   Follow up with [facility] On 09/05/2012. (1pm)    Contact information:   [phone] appt 09-05-12 at 1pm [address] [place] [address]      Follow up with Earl Many, MD On 09/05/2012. (9:00 AM appointment. Arrive 30 min prior. Bring all medications.)    Contact information:   [facility] [address] [place] [address] [phone]      Pamella Pert, MD 08/30/2012, 7:36 PM  Pager: [phone] Office: [phone] If no answer: [phone]

## 2012-09-05 ENCOUNTER — Ambulatory Visit: Payer: Self-pay | Attending: Family Medicine | Admitting: Internal Medicine

## 2012-09-05 VITALS — BP 116/79 | HR 77 | Temp 97.7°F | Resp 15 | Wt 233.6 lb

## 2012-09-05 DIAGNOSIS — F4323 Adjustment disorder with mixed anxiety and depressed mood: Secondary | ICD-10-CM | POA: Insufficient documentation

## 2012-09-05 DIAGNOSIS — E1169 Type 2 diabetes mellitus with other specified complication: Secondary | ICD-10-CM | POA: Insufficient documentation

## 2012-09-05 DIAGNOSIS — E1165 Type 2 diabetes mellitus with hyperglycemia: Secondary | ICD-10-CM

## 2012-09-05 DIAGNOSIS — E785 Hyperlipidemia, unspecified: Secondary | ICD-10-CM

## 2012-09-05 DIAGNOSIS — IMO0002 Reserved for concepts with insufficient information to code with codable children: Secondary | ICD-10-CM

## 2012-09-05 DIAGNOSIS — I10 Essential (primary) hypertension: Secondary | ICD-10-CM | POA: Insufficient documentation

## 2012-09-05 DIAGNOSIS — F17200 Nicotine dependence, unspecified, uncomplicated: Secondary | ICD-10-CM | POA: Insufficient documentation

## 2012-09-05 DIAGNOSIS — I213 ST elevation (STEMI) myocardial infarction of unspecified site: Secondary | ICD-10-CM | POA: Insufficient documentation

## 2012-09-05 MED ORDER — ASPIRIN 81 MG PO TBEC: 81.0000 mg | DELAYED_RELEASE_TABLET | Freq: Every day | ORAL | Status: DC

## 2012-09-05 MED ORDER — GLYBURIDE 2.5 MG PO TABS: 2.5000 mg | ORAL_TABLET | Freq: Two times a day (BID) | ORAL | Status: DC

## 2012-09-05 MED ORDER — METFORMIN HCL 500 MG PO TABS: 500.0000 mg | ORAL_TABLET | Freq: Two times a day (BID) | ORAL | Status: DC

## 2012-09-05 MED ORDER — METOPROLOL TARTRATE 25 MG PO TABS: 25.0000 mg | ORAL_TABLET | Freq: Two times a day (BID) | ORAL | Status: DC

## 2012-09-05 MED ORDER — PRAVASTATIN SODIUM 40 MG PO TABS: ORAL_TABLET | ORAL | Status: DC

## 2012-09-05 MED ORDER — TICAGRELOR 90 MG PO TABS: 90.0000 mg | ORAL_TABLET | Freq: Two times a day (BID) | ORAL | Status: DC

## 2012-09-05 MED ORDER — NICOTINE 21 MG/24HR TD PT24: 30.0000 | MEDICATED_PATCH | Freq: Every day | TRANSDERMAL | Status: DC

## 2012-09-05 NOTE — Progress Notes (Unsigned)
Patient is hospital follow up Recently suffered a heart attack Has high cholesterol DM

## 2012-09-05 NOTE — Progress Notes (Unsigned)
Patient ID: Martin Mullen, male   DOB: Nov 29, 1980, 32 y.o.   MRN: [id number] Patient Demographics  Martin Mullen, is a 32 y.o. male  HYQ:[id number]  [id number]:[id number]  DOB - Jun 22, 1980  Chief Complaint  Patient presents with  . Hospitalization Follow-up        Subjective:   [name] today is here to establish primary care. Patient has No headache, No chest pain, No abdominal pain - No Nausea, No new weakness tingling or numbness, No Cough - SOB. ********  Objective:    Filed Vitals:   09/05/12 1400  BP: 116/79  Pulse: 77  Temp: 97.7 F (36.5 C)  Resp: 15  Weight: 233 lb 9.6 oz (105.96 kg)  SpO2: 98%     ALLERGIES:  No Known Allergies  PAST MEDICAL HISTORY: Past Medical History  Diagnosis Date  . 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    PAST SURGICAL HISTORY: History reviewed. No pertinent past surgical history.  FAMILY HISTORY: History reviewed. No pertinent family history.  Social history: - Was heavy smoker, now down to 3 cigarettes a day, denies any drinking  MEDICATIONS AT HOME: Prior to Admission medications   Medication Sig Start Date End Date Taking? Authorizing Provider  aspirin 81 MG EC tablet Take 1 tablet (81 mg total) by mouth daily. 09/05/12   Chelisa Hennen Jenna Luo, MD  glyBURIDE (DIABETA) 2.5 MG tablet Take 1 tablet (2.5 mg total) by mouth 2 (two) times daily with a meal. 09/05/12   Joandy Burget Jenna Luo, MD  metFORMIN (GLUCOPHAGE) 500 MG tablet Take 1 tablet (500 mg total) by mouth 2 (two) times daily with a meal. 09/05/12   Audrey Eller Jenna Luo, MD  metoprolol tartrate (LOPRESSOR) 25 MG tablet Take 1 tablet (25 mg total) by mouth 2 (two) times daily. 09/05/12   Nour Rodrigues Jenna Luo, MD  nicotine (NICODERM CQ - DOSED IN MG/24 HOURS) 21 mg/24hr patch Place 30 patches onto the skin daily. 09/05/12   Jacqulene Huntley Jenna Luo, MD  nitroGLYCERIN (NITROSTAT) 0.4 MG SL tablet Place 1 tablet (0.4 mg total) under the tongue every 5 (five) minutes  x 3 doses as needed for chest pain. 08/30/12   Pamella Pert, MD  pravastatin (PRAVACHOL) 40 MG tablet 2 tablets in the evening after meals 09/05/12   Milanni Ayub Jenna Luo, MD  Ticagrelor (BRILINTA) 90 MG TABS tablet Take 1 tablet (90 mg total) by mouth 2 (two) times daily. 09/05/12   Alexus Michael Jenna Luo, MD    REVIEW OF SYSTEMS:  Constitutional:   No   Fevers, chills, fatigue.  HEENT:    No headaches, Sore throat,   Cardio-vascular: No chest pain,  Orthopnea, swelling in lower extremities, anasarca, palpitations  GI:  No abdominal pain, nausea, vomiting, diarrhea  Resp: No shortness of breath,  No coughing up of blood.No cough.No wheezing.  Skin:  no rash or lesions.  GU:  no dysuria, change in color of urine, no urgency or frequency.  No flank pain.  Musculoskeletal: No joint pain or swelling.  No decreased range of motion.  No back pain.  Psych: No change in mood or affect. No depression or anxiety.  No memory loss.   Exam  General appearance :Awake, alert, NAD, Speech Clear. HEENT: Atraumatic and Normocephalic, PERLA Neck: supple, no JVD. No cervical lymphadenopathy.  Chest: clear to auscultation bilaterally, no wheezing, rales or rhonchi CVS: S1 S2 regular, no murmurs.  Abdomen: soft, NBS, NT, ND, no gaurding, rigidity  or rebound. Extremities: No cyanosis, clubbing, B/L Lower Ext shows no edema,  Neurology: Awake alert, and oriented X 3, CN II-XII intact, Non focal Skin:No Rash or lesions Wounds: N/A    Data Review   Basic Metabolic Panel: No results found for this basename: NA, K, CL, CO2, GLUCOSE, BUN, CREATININE, CALCIUM, MG, PHOS,  in the last 168 hours Liver Function Tests: No results found for this basename: AST, ALT, ALKPHOS, BILITOT, PROT, ALBUMIN,  in the last 168 hours  CBC: No results found for this basename: WBC, NEUTROABS, HGB, HCT, MCV, PLT,  in the last 168  hours ------------------------------------------------------------------------------------------------------------------ No results found for this basename: HGBA1C,  in the last 72 hours ------------------------------------------------------------------------------------------------------------------ No results found for this basename: CHOL, HDL, LDLCALC, TRIG, CHOLHDL, LDLDIRECT,  in the last 72 hours ------------------------------------------------------------------------------------------------------------------ No results found for this basename: TSH, T4TOTAL, FREET3, T3FREE, THYROIDAB,  in the last 72 hours ------------------------------------------------------------------------------------------------------------------ No results found for this basename: VITAMINB12, FOLATE, FERRITIN, TIBC, IRON, RETICCTPCT,  in the last 72 hours  Coagulation profile  No results found for this basename: INR, PROTIME,  in the last 168 hours    Assessment & Plan   Active Problems: STEMI s/p RCA stent in 08/2012: Patient was recently admitted to [facility] with acute inferior wall MI status post PTCA and stenting of distal RCA, non DES - No further chest pains, continue aspirin, beta blocker, Brilinta, statin - Patient had followup appointment with Dr. Jacinto Halim this morning, encourage patient to keep the followup appointments  - Lifestyle changes, diabetes control counseled  New onset diabetes mellitus - Hemoglobin A1c was 11.5. Patient was started on glyburide and metformin - Log reviewed,BS 7/23 131-146, 7/22 [phone] - Encouraged patient to continue carb modified diet, glyburide and metformin. He did receive diabetic education in the hospital and seems to be very motivated - Will recheck hemoglobin A1c in 3 months  Hypertension - Stable, continue beta blocker  Hyperlipidemia: Lipid panel showed triglycerides 493, cholesterol 0.53 - Continue Pravachol, will recheck lipid panel in 3 months  Nicotine  abuse - Patient is actively cutting down on his smoking he is down from 1-1/2 pack per day to 3 cigarettes a day since dc from the hospital - Refilled nicotine patches   Obesity -  counseled strongly on exercise and weight loss    acute adjustment disorder with anxiety: Ambulatory referral to psychiatry   Recommendations: recheck hemoglobin A1c, lipid panel in 3 months, next visit   Follow-up in  3 months    Soma Lizak M.D. 09/05/2012, 3:30 PM

## 2012-09-13 ENCOUNTER — Ambulatory Visit: Payer: Self-pay

## 2012-10-02 ENCOUNTER — Encounter (HOSPITAL_COMMUNITY): Payer: Self-pay | Admitting: *Deleted

## 2012-10-02 ENCOUNTER — Emergency Department (HOSPITAL_COMMUNITY)
Admission: EM | Admit: 2012-10-02 | Discharge: 2012-10-02 | Disposition: A | Discharge status: Home / Self Care | Payer: Self-pay | Attending: Emergency Medicine | Admitting: Emergency Medicine

## 2012-10-02 DIAGNOSIS — F411 Generalized anxiety disorder: Secondary | ICD-10-CM | POA: Insufficient documentation

## 2012-10-02 DIAGNOSIS — E119 Type 2 diabetes mellitus without complications: Secondary | ICD-10-CM | POA: Insufficient documentation

## 2012-10-02 DIAGNOSIS — Z7982 Long term (current) use of aspirin: Secondary | ICD-10-CM | POA: Insufficient documentation

## 2012-10-02 DIAGNOSIS — E7800 Pure hypercholesterolemia, unspecified: Secondary | ICD-10-CM | POA: Insufficient documentation

## 2012-10-02 DIAGNOSIS — L02219 Cutaneous abscess of trunk, unspecified: Secondary | ICD-10-CM | POA: Insufficient documentation

## 2012-10-02 DIAGNOSIS — Z79899 Other long term (current) drug therapy: Secondary | ICD-10-CM | POA: Insufficient documentation

## 2012-10-02 DIAGNOSIS — I252 Old myocardial infarction: Secondary | ICD-10-CM | POA: Insufficient documentation

## 2012-10-02 DIAGNOSIS — L02215 Cutaneous abscess of perineum: Secondary | ICD-10-CM

## 2012-10-02 DIAGNOSIS — R079 Chest pain, unspecified: Secondary | ICD-10-CM | POA: Insufficient documentation

## 2012-10-02 DIAGNOSIS — Z87891 Personal history of nicotine dependence: Secondary | ICD-10-CM | POA: Insufficient documentation

## 2012-10-02 HISTORY — DX: Type 2 diabetes mellitus without complications: E11.9

## 2012-10-02 HISTORY — DX: Pure hypercholesterolemia, unspecified: E78.00

## 2012-10-02 LAB — POCT I-STAT TROPONIN I: Troponin i, poc: 0 ng/mL (ref 0.00–0.08)

## 2012-10-02 LAB — CBC
HCT: 45 % (ref 39.0–52.0)
MCH: 33.5 pg (ref 26.0–34.0)
MCHC: 37.3 g/dL — ABNORMAL HIGH (ref 30.0–36.0)
MCV: 89.8 fL (ref 78.0–100.0)
Platelets: 212 10*3/uL (ref 150–400)
RDW: 12.8 % (ref 11.5–15.5)
WBC: 19.1 10*3/uL — ABNORMAL HIGH (ref 4.0–10.5)

## 2012-10-02 LAB — BASIC METABOLIC PANEL
BUN: 13 mg/dL (ref 6–23)
Calcium: 9.7 mg/dL (ref 8.4–10.5)
Creatinine, Ser: 0.66 mg/dL (ref 0.50–1.35)
GFR calc Af Amer: >90 mL/min (ref >90)
GFR calc non Af Amer: >90 mL/min (ref >90)
Glucose, Bld: 89 mg/dL (ref 70–99)

## 2012-10-02 MED ORDER — CLINDAMYCIN HCL 300 MG PO CAPS
300.0000 mg | ORAL_CAPSULE | Freq: Once | ORAL | Status: AC
  Administered 2012-10-02: 300 mg via ORAL
  Filled 2012-10-02: qty 1

## 2012-10-02 MED ORDER — BUPIVACAINE HCL 0.25 % IJ SOLN
10.0000 mL | Freq: Once | INTRAMUSCULAR | Status: DC
  Filled 2012-10-02: qty 10

## 2012-10-02 MED ORDER — CLINDAMYCIN HCL 150 MG PO CAPS: 150.0000 mg | ORAL_CAPSULE | Freq: Four times a day (QID) | ORAL | Status: DC

## 2012-10-02 MED ORDER — OXYCODONE-ACETAMINOPHEN 7.5-325 MG PO TABS: 1.0000 | ORAL_TABLET | ORAL | Status: DC | PRN

## 2012-10-02 MED ORDER — OXYCODONE-ACETAMINOPHEN 5-325 MG PO TABS
2.0000 | ORAL_TABLET | Freq: Once | ORAL | Status: AC
  Administered 2012-10-02: 2 via ORAL
  Filled 2012-10-02: qty 2

## 2012-10-02 NOTE — ED Provider Notes (Signed)
CSN: [id number]     Arrival date & time 10/02/12  1323 History     First MD Initiated Contact with Patient 10/02/12 1542     Chief Complaint  Patient presents with  . Chest Pain  . Abscess   (Consider location/radiation/quality/duration/timing/severity/associated sxs/prior Treatment) Patient is a 32 y.o. male presenting with chest pain and abscess. The history is provided by the patient, medical records and a significant other. No language interpreter was used.  Chest Pain Pain location:  L lateral chest Pain quality: dull and pressure   Pain radiates to:  Does not radiate Pain radiates to the back: no   Pain severity:  No pain Onset quality:  Gradual Duration:  2 months Timing:  Sporadic Progression:  Resolved Chronicity:  Recurrent Associated symptoms: anxiety   Associated symptoms: no diaphoresis and no shortness of breath   Risk factors: coronary artery disease, diabetes mellitus and male sex   Abscess Location:  Ano-genital Ano-genital abscess location:  Perineum Abscess quality: fluctuance, induration, painful and warmth   Red streaking: no   Progression:  Worsening   Past Medical History  Diagnosis Date  . 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  . Diabetes mellitus without complication   . Hypercholesterolemia    Past Surgical History  Procedure Laterality Date  . Coronary angioplasty with stent placement     No family history on file. History  Substance Use Topics  . Smoking status: Former Smoker -- 1.00 packs/day for 20 years    Types: Cigarettes  . Smokeless tobacco: Not on file  . Alcohol Use: No    Review of Systems  Constitutional: Negative for diaphoresis.  Respiratory: Negative for shortness of breath.   Cardiovascular: Positive for chest pain.  All other systems reviewed and are negative.    Allergies  Review of patient's allergies indicates no known allergies.  Home Medications   Current  Outpatient Rx  Name  Route  Sig  Dispense  Refill  . aspirin 81 MG EC tablet   Oral   Take 1 tablet (81 mg total) by mouth daily.   30 tablet   4   . glyBURIDE (DIABETA) 2.5 MG tablet   Oral   Take 1 tablet (2.5 mg total) by mouth 2 (two) times daily with a meal.   60 tablet   4   . metFORMIN (GLUCOPHAGE) 500 MG tablet   Oral   Take 1 tablet (500 mg total) by mouth 2 (two) times daily with a meal.   60 tablet   4   . metoprolol tartrate (LOPRESSOR) 25 MG tablet   Oral   Take 1 tablet (25 mg total) by mouth 2 (two) times daily.   60 tablet   4   . nicotine (NICODERM CQ - DOSED IN MG/24 HOURS) 21 mg/24hr patch   Transdermal   Place 30 patches onto the skin daily.   28 patch   0   . nitroGLYCERIN (NITROSTAT) 0.4 MG SL tablet   Sublingual   Place 1 tablet (0.4 mg total) under the tongue every 5 (five) minutes x 3 doses as needed for chest pain.   30 tablet   12   . pravastatin (PRAVACHOL) 40 MG tablet      2 tablets in the evening after meals   60 tablet   4   . Ticagrelor (BRILINTA) 90 MG TABS tablet   Oral   Take 1 tablet (90 mg total)  by mouth 2 (two) times daily.   60 tablet   12     He will need 1 year supply, I will Rx after he use ...    BP 134/81  Pulse 96  Temp(Src) 98.6 F (37 C) (Oral)  Resp 14  SpO2 97% Physical Exam  Nursing note and vitals reviewed. Constitutional: He is oriented to person, place, and time. He appears well-developed and well-nourished.  HENT:  Head: Normocephalic.  Eyes: Pupils are equal, round, and reactive to light.  Neck: Normal range of motion.  Cardiovascular: Normal rate, regular rhythm, normal heart sounds and intact distal pulses.   Pulmonary/Chest: Effort normal and breath sounds normal.  Abdominal: Soft. Bowel sounds are normal.  Genitourinary:  Abscess at base of scrotum right perineal area.  Scar present from prior drainage of abscess in same location.  Musculoskeletal: He exhibits no edema.  Neurological:  He is alert and oriented to person, place, and time.  Skin: Skin is warm and dry.  Psychiatric: He has a normal mood and affect. His behavior is normal. Judgment and thought content normal.    ED Course   Procedures (including critical care time) INCISION AND DRAINAGE Performed by: Jimmye Norman Consent: Verbal consent obtained. Risks and benefits: risks, benefits and alternatives were discussed Type: abscess  Body area: perineum  Anesthesia: local infiltration  Incision was made with a scalpel.  Local anesthetic: lidocaine 2%, marcaine 0.25%  Anesthetic total: 6 ml  Complexity: complex  Blunt dissection to break up loculations  Drainage: purulent  Drainage amount: minimal  Patient tolerance: Patient tolerated the procedure well with no immediate complications.    Labs Reviewed  CBC - Abnormal; Notable for the following:    WBC 19.1 (*)    MCHC 37.3 (*)    All other components within normal limits  BASIC METABOLIC PANEL  POCT I-STAT TROPONIN I   No results found. No diagnosis found. Patient in ED today for evaluation of groin abscess.  During triage intake, patient also endorses intermittent chest pain.  History of MI 7/14, received interventional cath with stent placement in RCA.  Patient also with diagonal stenosis that has been treated medically.  Patient states he has been having this intermittent pain since his discharge in July.  Currently chest pain free.  Initial troponin wnl.  Discussed with Dr. Jodi Mourning.  He will perform bedside ultrasound to evaluate abscess.  6:15 PM Today's ECG without evidence of ischemia, unchanged from 08/30/12.  Second troponin negative.  Patient discussed with and seen by Dr. Jodi Mourning, who performed bedside ultrasound of abscess.  Abscess drained.  RX for clindamycin, percocet.  General surgery follow-up. MDM    Jimmye Norman, NP 10/03/12 [phone]

## 2012-10-02 NOTE — ED Notes (Signed)
Pt is also concerned because he has been having intermittent chest pain and last MI was in 08/28/12

## 2012-10-02 NOTE — ED Notes (Signed)
Pt has scrotal area abscess and has had this before with a drainage tube.  Pt is on blood thinners and has had recent heart attack

## 2012-10-03 NOTE — ED Provider Notes (Signed)
Medical screening examination/treatment/procedure(s) were conducted as a shared visit with non-physician practitioner(s) or resident  and myself.  I personally evaluated the patient during the encounter and agree with the findings and plan unless otherwise indicated.    Small perineum abscess, pt has had multiple in the past, this is on the smaller scale per patient.  No fevers, no vomiting.  Well appearing.  Focal area of induration with minimal fluctuance right perineum, no crepitus, no extension into rectal area appreciated.  No erythema or significant warmth.  US showed small amount of fluid..  I/ D and close fup outpt with po abx.  Pt comfortable with plan  EMERGENCY DEPARTMENT US SOFT TISSUE INTERPRETATION "Study: Limited Soft Tissue Ultrasound"  INDICATIONS: swelling/ pain, possible abscess Multiple views of the body part were obtained in real-time with a multi-frequency linear probe PERFORMED BY:  Dr Jodi Mourning IMAGES ARCHIVED?: yes SIDE: right perineum inferior to scrotum BODY PART: above FINDINGS: small pockets of fluid, mild tissue swelling INTERPRETATION:  Early abscess DC    Enid Skeens, MD 10/03/12 1211

## 2012-12-06 ENCOUNTER — Ambulatory Visit: Payer: Self-pay

## 2013-01-29 ENCOUNTER — Emergency Department: Payer: Self-pay | Admitting: Emergency Medicine

## 2013-01-29 LAB — CBC
MCH: 31.4 pg (ref 26.0–34.0)
MCV: 93 fL (ref 80–100)
RDW: 13.8 % (ref 11.5–14.5)

## 2013-01-29 LAB — BASIC METABOLIC PANEL
Anion Gap: 4 — ABNORMAL LOW (ref 7–16)
Chloride: 107 mmol/L (ref 98–107)
Creatinine: 0.88 mg/dL (ref 0.60–1.30)
EGFR (African American): >60
Glucose: 228 mg/dL — ABNORMAL HIGH (ref 65–99)
Osmolality: 286 (ref 275–301)

## 2013-01-29 LAB — TROPONIN I: Troponin-I: <0.02 ng/mL

## 2013-07-09 ENCOUNTER — Ambulatory Visit (INDEPENDENT_AMBULATORY_CARE_PROVIDER_SITE_OTHER): Payer: Medicaid Other | Admitting: Internal Medicine

## 2013-07-09 ENCOUNTER — Encounter: Payer: Self-pay | Admitting: Internal Medicine

## 2013-07-09 VITALS — BP 134/80 | HR 81 | Temp 97.9°F | Ht 69.5 in | Wt 265.0 lb

## 2013-07-09 DIAGNOSIS — I219 Acute myocardial infarction, unspecified: Secondary | ICD-10-CM

## 2013-07-09 DIAGNOSIS — IMO0002 Reserved for concepts with insufficient information to code with codable children: Secondary | ICD-10-CM

## 2013-07-09 DIAGNOSIS — I10 Essential (primary) hypertension: Secondary | ICD-10-CM

## 2013-07-09 DIAGNOSIS — E669 Obesity, unspecified: Secondary | ICD-10-CM

## 2013-07-09 DIAGNOSIS — E1140 Type 2 diabetes mellitus with diabetic neuropathy, unspecified: Secondary | ICD-10-CM

## 2013-07-09 DIAGNOSIS — E1149 Type 2 diabetes mellitus with other diabetic neurological complication: Secondary | ICD-10-CM

## 2013-07-09 DIAGNOSIS — F17200 Nicotine dependence, unspecified, uncomplicated: Secondary | ICD-10-CM

## 2013-07-09 DIAGNOSIS — E1165 Type 2 diabetes mellitus with hyperglycemia: Secondary | ICD-10-CM | POA: Insufficient documentation

## 2013-07-09 DIAGNOSIS — E1169 Type 2 diabetes mellitus with other specified complication: Secondary | ICD-10-CM | POA: Insufficient documentation

## 2013-07-09 DIAGNOSIS — E1142 Type 2 diabetes mellitus with diabetic polyneuropathy: Secondary | ICD-10-CM

## 2013-07-09 DIAGNOSIS — E785 Hyperlipidemia, unspecified: Secondary | ICD-10-CM

## 2013-07-09 DIAGNOSIS — I213 ST elevation (STEMI) myocardial infarction of unspecified site: Secondary | ICD-10-CM

## 2013-07-09 MED ORDER — TICAGRELOR 90 MG PO TABS: 90.0000 mg | ORAL_TABLET | Freq: Two times a day (BID) | ORAL | Status: DC

## 2013-07-09 MED ORDER — METOPROLOL TARTRATE 25 MG PO TABS: 25.0000 mg | ORAL_TABLET | Freq: Two times a day (BID) | ORAL | Status: DC

## 2013-07-09 MED ORDER — PRAVASTATIN SODIUM 40 MG PO TABS: ORAL_TABLET | ORAL | Status: DC

## 2013-07-09 NOTE — Assessment & Plan Note (Signed)
He does not want to take the Brilinta secondary to cost Will refer him to cardiology for further evaluation and treatment

## 2013-07-09 NOTE — Assessment & Plan Note (Signed)
Encouraged him to work on diet and exercise 

## 2013-07-09 NOTE — Patient Instructions (Addendum)
Metabolic Syndrome, Adult Metabolic syndrome descibes a group of risk factors for heart disease and diabetes. This syndrome has other names including Insulin Resistance Syndrome. The more risk factors you have, the higher your risk of having a heart attack, stroke, or developing diabetes. These risk factors include:  High blood sugar.  High blood triglyceride (a fat found in the blood) level.  High blood pressure.  Abdominal obesity (your extra weight is around your waist instead of your hips).  Low levels of high-density lipoprotein, HDL (good blood cholesterol). If you have any three of these risk factors, you have metabolic syndrome. If you have even one of these factors, you should make lifestyle changes to improve your health in order to prevent serious health diseases.  In people with metabolic syndrome, the cells do not respond properly to insulin. This can lead to high levels of glucose in the blood, which can interfere with normal body processes. Eventually, this can cause high blood pressure and higher fat levels in the blood, and inflammation of your blood vessels. The result can be heart disease and stroke.  CAUSES   Eating a diet rich in calories and saturated fat.  Too little physical activity.  Being overweight. Other underlying causes are:  Family history (genetics).  Ethnicity (South Asians are at a higher risk).  Older age (your chances of developing metabolic syndrome are higher as you grow older).  Insulin resistance. SYMPTOMS  By itself, metabolic syndrome has no symptoms. However, you might have symptoms of diabetes (high blood sugar) or high blood pressure, such as:  Increased thirst, urination, and tiredness.  Dizzy spells.  Dull headaches that are unusual for you.  Blurred vision.  Nosebleeds. DIAGNOSIS  Your caregiver may make a diagnosis of metabolic syndrome if you have at least three of these factors:  If you are overweight mostly around the  waist. This means a waistline greater than 40" in men and more than 35" in women. The waistline limits are 31 to 35 inches for women and 37 to 39 inches for men. In those who have certain genetic risk factors, such as having a family history of diabetes or being of Asian descent.  If you have a blood pressure of 130/85 mm Hg or more, or if you are being treated for high blood pressure.  If your blood triglyceride level is 150 mg/dL or more, or you are being treated for high levels of triglyceride.  If the level of HDL in your blood is below 40 mg/dL in men, less than 50 mg/dL in women, or you are receiving treatment for low levels of HDL.  If the level of sugar in your blood is high with fasting blood sugar level of 110 mg/dL or more, or you are under treatment for diabetes. TREATMENT  Your caregiver may have you make lifestyle changes, which may include:  Exercise.  Losing weight.  Maintaining a healthy diet.  Quitting smoking. The lifestyle changes listed above are key in reducing your risk for heart disease and stroke. Medicines may also be prescribed to help your body respond to insulin better and to reduce your blood pressure and blood fat levels. Aspirin may be recommended to reduce risks of heart disease or stroke.  HOME CARE INSTRUCTIONS   Exercise.  Measure your waist at regular intervals just above the hipbones after you have breathed out.  Maintain a healthy diet.  Eat fruits, such as apples, oranges, and pears.  Eat vegetables.  Eat legumes, such as kidney   beans, peas, and lentils.  Eat food rich in soluble fiber, such as whole grain cereal, oatmeal, and oat bran.  Use olive or safflower oils and avoid saturated fats.  Eat nuts.  Limit the amount of salt you eat or add to food.  Limit the amount of alcohol you drink.  Include fish in your diet, if possible.  Stop smoking if you are a smoker.  Maintain regular follow-up appointments.  Follow your  caregiver's advice. SEEK MEDICAL CARE IF:   You feel very tired or fatigued.  You develop excessive thirst.  You pass large quantities of urine.  You are putting on weight around your waist rather than losing weight.  You develop headaches over and over again.  You have off-and-on dizzy spells. SEEK IMMEDIATE MEDICAL CARE IF:   You develop nosebleeds.  You develop sudden blurred vision.  You develop sudden dizzy spells.  You develop chest pains, trouble breathing, or feel an abnormal or irregular heart beat.  You have a fainting episode.  You develop any sudden trouble speaking and/or swallowing.  You develop sudden weakness in one arm and/or one leg. MAKE SURE YOU:   Understand these instructions.  Will watch your condition.  Will get help right away if you are not doing well or get worse. Document Released: 05/10/2007 Document Revised: 04/25/2011 Document Reviewed: 05/10/2007 [employer] Patient Information 2014 [employer].  

## 2013-07-09 NOTE — Progress Notes (Signed)
HPI  Pt presents to the clinic today to establish care. He has no concerns today. He does have a complicated medical history for his age. 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 Does not follow with cardiology. Occasional angina, but does not take the nitrostat. He continues to smoke 1 ppd. Hypertriglyceridemia on pravastatin. He does not follow a low fat, low cholesterol diet. He also has DM 2 with neuropathy. He does not really check his sugars. He does not follow a low carb diet or exercise.  Flu: never Tetanus: 06/2012 Pneumovax: 06/2012 Vision: 2012 Dentist: more than 2 years ago   Past Medical History  Diagnosis Date  . 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  . Diabetes mellitus without complication   . Hypercholesterolemia   . Chicken pox     Current Outpatient Prescriptions  Medication Sig Dispense Refill  . aspirin 81 MG EC tablet Take 1 tablet (81 mg total) by mouth daily.  30 tablet  4  . glyBURIDE (DIABETA) 2.5 MG tablet Take 1 tablet (2.5 mg total) by mouth 2 (two) times daily with a meal.  60 tablet  4  . metFORMIN (GLUCOPHAGE) 500 MG tablet Take 1 tablet (500 mg total) by mouth 2 (two) times daily with a meal.  60 tablet  4  . metoprolol tartrate (LOPRESSOR) 25 MG tablet Take 1 tablet (25 mg total) by mouth 2 (two) times daily.  60 tablet  4  . Multiple Vitamins-Minerals (MULTIVITAMIN PO) Take 1 capsule by mouth daily.      . nitroGLYCERIN (NITROSTAT) 0.4 MG SL tablet Place 1 tablet (0.4 mg total) under the tongue every 5 (five) minutes x 3 doses as needed for chest pain.  30 tablet  12  . pravastatin (PRAVACHOL) 40 MG tablet 2 tablets in the evening after meals  60 tablet  4  . Ticagrelor (BRILINTA) 90 MG TABS tablet Take 1 tablet (90 mg total) by mouth 2 (two) times daily.  60 tablet  12   No current facility-administered medications for this visit.    No Known Allergies  Family History  Problem Relation Age  of Onset  . Arthritis Mother     Rheumatoid  . Diabetes Mother   . Hyperlipidemia Mother   . Diabetes Father   . Cancer Maternal Grandfather     Prostate  . Parkinson's disease Maternal Grandfather     History   Social History  . Marital Status: Married    Spouse Name: N/A    Number of Children: N/A  . Years of Education: N/A   Occupational History  . Not on file.   Social History Main Topics  . Smoking status: Current Every Day Smoker -- 1.00 packs/day for 20 years    Types: Cigarettes  . Smokeless tobacco: Not on file  . Alcohol Use: Yes     Comment: occasional  . Drug Use: No  . Sexual Activity: Yes    Birth Control/ Protection: None   Other Topics Concern  . Not on file   Social History Narrative  . No narrative on file    ROS:  Constitutional: Denies fever, malaise, fatigue, headache or abrupt weight changes.  HEENT: Denies eye pain, eye redness, ear pain, ringing in the ears, wax buildup, runny nose, nasal congestion, bloody nose, or sore throat. Respiratory: Denies difficulty breathing, shortness of breath, cough or sputum production.   Cardiovascular: Pt reports occasional chest pain. Denies  chest tightness, palpitations or swelling in the hands or feet.  Gastrointestinal: Denies abdominal pain, bloating, constipation, diarrhea or blood in the stool.  GU: Pt reports erectile dysfunction. Denies frequency, urgency, pain with urination, blood in urine, odor or discharge. Musculoskeletal: Denies decrease in range of motion, difficulty with gait, muscle pain or joint pain and swelling.  Skin: Denies redness, rashes, lesions or ulcercations.  Neurological: Pt reports burning sensation in feet. Denies dizziness, difficulty with memory, difficulty with speech or problems with balance and coordination.   No other specific complaints in a complete review of systems (except as listed in HPI above).  PE:  BP 134/80  Pulse 81  Temp(Src) 97.9 F (36.6 C) (Oral)   Ht 5' 9.5" (1.765 m)  Wt 265 lb (120.203 kg)  BMI 38.59 kg/m2  SpO2 98% Wt Readings from Last 3 Encounters:  07/09/13 265 lb (120.203 kg)  09/05/12 233 lb 9.6 oz (105.96 kg)  08/28/12 237 lb 7 oz (107.7 kg)    General: Appears his stated age, obese but well developed, well nourished in NAD. HEENT: Head: normal shape and size; Eyes: sclera white, no icterus, conjunctiva pink, PERRLA and EOMs intact; Ears: Tm's gray and intact, normal light reflex; Nose: mucosa pink and moist, septum midline; Throat/Mouth: Teeth present, mucosa pink and moist, no lesions or ulcerations noted.  Neck: Normal range of motion. Neck supple, trachea midline. No massses, lumps or thyromegaly present.  Cardiovascular: Normal rate and rhythm. S1,S2 noted.  No murmur, rubs or gallops noted. No JVD or BLE edema. No carotid bruits noted. Pulmonary/Chest: Normal effort and positive vesicular breath sounds. No respiratory distress. No wheezes, rales or ronchi noted.  Abdomen: Soft and nontender. Normal bowel sounds, no bruits noted. No distention or masses noted. Liver, spleen and kidneys non palpable. Musculoskeletal: Normal range of motion. No signs of joint swelling. No difficulty with gait.  Neurological: Alert and oriented. Cranial nerves II-XII intact. Coordination normal. +DTRs bilaterally. Psychiatric: Mood and affect normal. Behavior is normal. Judgment and thought content normal.    BMET    Component Value Date/Time   NA 138 10/02/2012 1346   K 4.0 10/02/2012 1346   CL 102 10/02/2012 1346   CO2 24 10/02/2012 1346   GLUCOSE 89 10/02/2012 1346   BUN 13 10/02/2012 1346   CREATININE 0.66 10/02/2012 1346   CALCIUM 9.7 10/02/2012 1346   GFRNONAA >90 10/02/2012 1346   GFRAA >90 10/02/2012 1346    Lipid Panel     Component Value Date/Time   CHOL 153 08/28/2012 1115   TRIG 493* 08/28/2012 1115   HDL 21* 08/28/2012 1115   CHOLHDL 7.3 08/28/2012 1115   VLDL UNABLE TO CALCULATE IF TRIGLYCERIDE OVER 400 mg/dL 8/65/78467/15/2014  [id number]   LDLCALC UNABLE TO CALCULATE IF TRIGLYCERIDE OVER 400 mg/dL 5/28/41327/15/2014 [id number]    CBC    Component Value Date/Time   WBC 19.1* 10/02/2012 1346   RBC 5.01 10/02/2012 1346   HGB 16.8 10/02/2012 1346   HCT 45.0 10/02/2012 1346   PLT 212 10/02/2012 1346   MCV 89.8 10/02/2012 1346   MCH 33.5 10/02/2012 1346   MCHC 37.3* 10/02/2012 1346   RDW 12.8 10/02/2012 1346    Hgb A1C Lab Results  Component Value Date   HGBA1C 11.5* 08/28/2012     Assessment and Plan:

## 2013-07-09 NOTE — Assessment & Plan Note (Signed)
On pravastatin  Will recheck CMET and lipids today Encouraged him to work on diet and exercise

## 2013-07-09 NOTE — Assessment & Plan Note (Signed)
Non compliant Will recheck A1C today If A1C > 10 will start insulin He declines referral to education and nutrition Advised him to maintain a low carb diet and quit smoking

## 2013-07-09 NOTE — Assessment & Plan Note (Signed)
Encouraged him to quit Handout given

## 2013-07-09 NOTE — Progress Notes (Signed)
Pre visit review using our clinic review tool, if applicable. No additional management support is needed unless otherwise documented below in the visit note. 

## 2013-07-09 NOTE — Assessment & Plan Note (Signed)
Controlled Not medicated at this time Encouraged him to stop smoking and work on diet and exercise

## 2013-07-10 ENCOUNTER — Telehealth: Payer: Self-pay | Admitting: Internal Medicine

## 2013-07-10 LAB — CBC
HEMATOCRIT: 47.5 % (ref 39.0–52.0)
Hemoglobin: 16.2 g/dL (ref 13.0–17.0)
MCHC: 34.2 g/dL (ref 30.0–36.0)
MCV: 95.3 fl (ref 78.0–100.0)
Platelets: 232 10*3/uL (ref 150.0–400.0)
RBC: 4.98 Mil/uL (ref 4.22–5.81)
RDW: 13.1 % (ref 11.5–15.5)
WBC: 14.8 10*3/uL — ABNORMAL HIGH (ref 4.0–10.5)

## 2013-07-10 LAB — COMPREHENSIVE METABOLIC PANEL
ALK PHOS: 86 U/L (ref 39–117)
ALT: 42 U/L (ref 0–53)
AST: 23 U/L (ref 0–37)
Albumin: 3.7 g/dL (ref 3.5–5.2)
BILIRUBIN TOTAL: 0.5 mg/dL (ref 0.2–1.2)
BUN: 13 mg/dL (ref 6–23)
CO2: 27 mEq/L (ref 19–32)
Calcium: 9.6 mg/dL (ref 8.4–10.5)
Chloride: 102 mEq/L (ref 96–112)
Creatinine, Ser: 0.9 mg/dL (ref 0.4–1.5)
GFR: 104.52 mL/min (ref >60.00)
Glucose, Bld: 235 mg/dL — ABNORMAL HIGH (ref 70–99)
Potassium: 4.2 mEq/L (ref 3.5–5.1)
SODIUM: 138 meq/L (ref 135–145)
TOTAL PROTEIN: 6.8 g/dL (ref 6.0–8.3)

## 2013-07-10 LAB — LIPID PANEL
Cholesterol: 132 mg/dL (ref 0–200)
HDL: 30.6 mg/dL — AB (ref >39.00)
LDL Cholesterol: 46 mg/dL (ref 0–99)
Total CHOL/HDL Ratio: 4
Triglycerides: 279 mg/dL — ABNORMAL HIGH (ref 0.0–149.0)
VLDL: 55.8 mg/dL — AB (ref 0.0–40.0)

## 2013-07-10 LAB — TSH: TSH: 0.68 u[IU]/mL (ref 0.35–4.50)

## 2013-07-10 LAB — HEMOGLOBIN A1C: Hgb A1c MFr Bld: 9.3 % — ABNORMAL HIGH (ref 4.6–6.5)

## 2013-07-10 NOTE — Telephone Encounter (Signed)
Relevant patient education mailed to patient.  

## 2013-07-16 ENCOUNTER — Other Ambulatory Visit: Payer: Self-pay

## 2013-07-16 MED ORDER — GLYBURIDE 5 MG PO TABS: 5.0000 mg | ORAL_TABLET | Freq: Two times a day (BID) | ORAL | Status: DC

## 2013-07-16 MED ORDER — METFORMIN HCL 1000 MG PO TABS: 1000.0000 mg | ORAL_TABLET | Freq: Two times a day (BID) | ORAL | Status: DC

## 2013-07-19 ENCOUNTER — Ambulatory Visit (INDEPENDENT_AMBULATORY_CARE_PROVIDER_SITE_OTHER): Payer: Self-pay | Admitting: Cardiovascular Disease

## 2013-07-19 ENCOUNTER — Encounter: Payer: Self-pay | Admitting: Cardiovascular Disease

## 2013-07-19 VITALS — BP 122/102 | HR 88 | Ht 70.0 in | Wt 260.2 lb

## 2013-07-19 DIAGNOSIS — R079 Chest pain, unspecified: Secondary | ICD-10-CM

## 2013-07-19 DIAGNOSIS — I2510 Atherosclerotic heart disease of native coronary artery without angina pectoris: Secondary | ICD-10-CM

## 2013-07-19 DIAGNOSIS — F17200 Nicotine dependence, unspecified, uncomplicated: Secondary | ICD-10-CM

## 2013-07-19 DIAGNOSIS — I10 Essential (primary) hypertension: Secondary | ICD-10-CM

## 2013-07-19 DIAGNOSIS — E785 Hyperlipidemia, unspecified: Secondary | ICD-10-CM

## 2013-07-19 MED ORDER — CLOPIDOGREL BISULFATE 75 MG PO TABS: 75.0000 mg | ORAL_TABLET | Freq: Every day | ORAL | Status: DC

## 2013-07-19 NOTE — Assessment & Plan Note (Signed)
I had a prolonged discussion with him about the importance of smoking cessation. These should be a priority for him but he still not mentally ready to quit.

## 2013-07-19 NOTE — Assessment & Plan Note (Signed)
Lab Results  Component Value Date   CHOL 132 07/09/2013   HDL 30.60* 07/09/2013   LDLCALC 46 07/09/2013   TRIG 279.0* 07/09/2013   CHOLHDL 4 07/09/2013   LDL is at target. I agree with continuing pravastatin and adding fish oil in order to bring down his triglyceride. Hopefully, this will improve with better diabetes control.

## 2013-07-19 NOTE — Patient Instructions (Signed)
Stop Brilinta Start Plavix 75 mg once daily.   Your physician has requested that you have an exercise tolerance test. For further information please visit [email or web address] Please also follow instruction sheet, as given.  Follow up in 3 months.

## 2013-07-19 NOTE — Progress Notes (Signed)
Primary care provider: Nicki ReaperRegina Mullen  HPI  This is a 33 year old man who is here today to establish cardiovascular care. He presented in July 2014 with inferior ST elevation myocardial infarction. He was treated by Dr. Jacinto HalimGanji. Cardiac catheterization showed 95% distal RCA stenosis with normal ejection fraction. He underwent successful angioplasty and bare-metal stent placement without complications. He was diagnosed with diabetes at that time. He also has hyperlipidemia and prolonged history of tobacco use. He did have previous history of cocaine and marijuana use before his myocardial infarction. He has been doing reasonably well. He complains of substernal chest pain if he overexerts himself. 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 He has been compliant with his medications. He has no desire to quit smoking at the present time.  No Known Allergies   Current Outpatient Prescriptions on File Prior to Visit  Medication Sig Dispense Refill  . aspirin 81 MG EC tablet Take 1 tablet (81 mg total) by mouth daily.  30 tablet  4  . glyBURIDE (DIABETA) 5 MG tablet Take 1 tablet (5 mg total) by mouth 2 (two) times daily with a meal.  60 tablet  2  . metFORMIN (GLUCOPHAGE) 1000 MG tablet Take 1 tablet (1,000 mg total) by mouth 2 (two) times daily with a meal.  60 tablet  2  . metoprolol tartrate (LOPRESSOR) 25 MG tablet Take 1 tablet (25 mg total) by mouth 2 (two) times daily.  60 tablet  4  . Multiple Vitamins-Minerals (MULTIVITAMIN PO) Take 1 capsule by mouth daily.      . nitroGLYCERIN (NITROSTAT) 0.4 MG SL tablet Place 1 tablet (0.4 mg total) under the tongue every 5 (five) minutes x 3 doses as needed for chest pain.  30 tablet  12  . pravastatin (PRAVACHOL) 40 MG tablet 2 tablets in the evening after meals  60 tablet  4   No current facility-administered medications on file prior to visit.     Past Medical History  Diagnosis Date  . Acute transmural inferior wall MI 08/28/2012    .5 x 12 mm Veri-flex stent  non-DES.  . Diabetes mellitus without complication   . Chicken pox   . Coronary artery disease     Inferior ST elevation myocardial infarction in July of 2014. Cardiac catheterization showed 95% distal RCA stenosis and 90% first diagonal stenosis with normal ejection fraction. He underwent PCI in bare-metal stent placement to the distal RCA.  Marland Kitchen. Hypercholesterolemia   . Tobacco use      Past Surgical History  Procedure Laterality Date  . Coronary angioplasty with stent placement       Family History  Problem Relation Age of Onset  . Arthritis Mother     Rheumatoid  . Diabetes Mother   . Hyperlipidemia Mother   . Diabetes Father   . Cancer Maternal Grandfather     Prostate  . Parkinson's disease Maternal Grandfather      History   Social History  . Marital Status: Married    Spouse Name: N/A    Number of Children: N/A  . Years of Education: N/A   Occupational History  . Not on file.   Social History Main Topics  . Smoking status: Current Every Day Smoker -- 1.00 packs/day for 20 years    Types: Cigarettes  . Smokeless tobacco: Not on file  . Alcohol Use: Yes     Comment: occasional  . Drug Use: Yes    Special: Cocaine  . Sexual Activity: Yes  Birth Control/ Protection: None   Other Topics Concern  . Not on file   Social History Narrative  . No narrative on file     ROS A 10 point review of system was performed. It is negative other than that mentioned in the history of present illness.   PHYSICAL EXAM   BP 122/102  Pulse 88  Ht 5\' 10"  (1.778 m)  Wt 260 lb 4 oz (118.049 kg)  BMI 37.34 kg/m2 Constitutional: He is oriented to person, place, and time. He appears well-developed and well-nourished. No distress.  HENT: No nasal discharge.  Head: Normocephalic and atraumatic.  Eyes: Pupils are equal and round.  No discharge. Neck: Normal range of motion. Neck supple. No JVD present. No thyromegaly present.  Cardiovascular: Normal rate, regular  rhythm, normal heart sounds. Exam reveals no gallop and no friction rub. No murmur heard.  Pulmonary/Chest: Effort normal and breath sounds normal. No stridor. No respiratory distress. He has no wheezes. He has no rales. He exhibits no tenderness.  Abdominal: Soft. Bowel sounds are normal. He exhibits no distension. There is no tenderness. There is no rebound and no guarding.  Musculoskeletal: Normal range of motion. He exhibits no edema and no tenderness.  Neurological: He is alert and oriented to person, place, and time. Coordination normal.  Skin: Skin is warm and dry. No rash noted. He is not diaphoretic. No erythema. No pallor.  Psychiatric: He has a normal mood and affect. His behavior is normal. Judgment and thought content normal.       LEX:NTZGY  Rhythm  WITHIN NORMAL LIMITS   ASSESSMENT AND PLAN

## 2013-07-19 NOTE — Assessment & Plan Note (Signed)
Continue treatment with metoprolol. 

## 2013-07-19 NOTE — Assessment & Plan Note (Signed)
He is doing reasonably well but does seem to complain of mild angina. Thus, I recommend proceeding with a treadmill stress test. He has been having difficult time affording Brilinta. Thus, on switching him to Plavix 75 mg once daily. The plan is to treat him with this for at least one year after his myocardial infarction. He did have a bare-metal and not a drug-eluting stent placement. I had a prolonged discussion with him about the importance of controlling his risk factors.

## 2013-08-06 ENCOUNTER — Ambulatory Visit (INDEPENDENT_AMBULATORY_CARE_PROVIDER_SITE_OTHER): Payer: Self-pay | Admitting: Cardiovascular Disease

## 2013-08-06 DIAGNOSIS — R079 Chest pain, unspecified: Secondary | ICD-10-CM

## 2013-08-06 DIAGNOSIS — I25119 Atherosclerotic heart disease of native coronary artery with unspecified angina pectoris: Secondary | ICD-10-CM

## 2013-08-06 DIAGNOSIS — I209 Angina pectoris, unspecified: Secondary | ICD-10-CM

## 2013-08-06 DIAGNOSIS — I2510 Atherosclerotic heart disease of native coronary artery without angina pectoris: Secondary | ICD-10-CM

## 2013-08-06 NOTE — Procedures (Signed)
   Treadmill Stress test  Indication: Chest pain with known history of coronary artery disease.  Baseline Data:  Resting EKG shows NSR with rate of 109 bpm, no significant ST or T wave changes. Resting blood pressure of 136/93 mm Hg Stand bruce protocal was used.  Exercise Data:  Patient exercised for 8 min 29 sec,  Peak heart rate of 153 bpm.  This was 81% of the maximum predicted heart rate. No symptoms of chest pain or lightheadedness were reported at peak stress or in recovery.  Peak Blood pressure recorded was 178/107 Maximal work level: 10.10 METs.  Heart rate at 3 minutes in recovery was 120 bpm. BP response: Normal HR response: Normal  EKG with Exercise: Sinus tachycardia with no significant ST changes.  FINAL IMPRESSION: Normal exercise stress test at 81% maximum predicted heart rate. No significant EKG changes concerning for ischemia. Good exercise tolerance.  Recommendation: Continue medical therapy.

## 2013-08-06 NOTE — Patient Instructions (Signed)
Your stress test is normal.

## 2013-08-26 ENCOUNTER — Emergency Department: Payer: Self-pay | Admitting: Emergency Medicine

## 2013-09-10 ENCOUNTER — Emergency Department: Payer: Self-pay | Admitting: Emergency Medicine

## 2013-10-08 ENCOUNTER — Ambulatory Visit: Payer: Self-pay | Admitting: Internal Medicine

## 2013-10-10 ENCOUNTER — Ambulatory Visit: Payer: Self-pay | Admitting: Internal Medicine

## 2013-10-10 DIAGNOSIS — Z0289 Encounter for other administrative examinations: Secondary | ICD-10-CM

## 2013-10-11 ENCOUNTER — Telehealth: Payer: Self-pay | Admitting: Internal Medicine

## 2013-10-11 NOTE — Telephone Encounter (Signed)
Yes he needs to be contacted for follow up. Please leave on schedule for no show fee

## 2013-10-11 NOTE — Telephone Encounter (Signed)
Patient did not come for their scheduled appointment 10/10/13  Please let me know if the patient needs to be contacted immediately for follow up or if no follow up is necessary.   ° °

## 2013-10-11 NOTE — Telephone Encounter (Signed)
Left message for pt to call office please r/s his 3 month follow up he missed

## 2013-10-16 ENCOUNTER — Encounter: Payer: Self-pay | Admitting: Internal Medicine

## 2013-10-16 NOTE — Telephone Encounter (Signed)
Mailed no show letter to pt.

## 2013-11-21 ENCOUNTER — Encounter: Payer: Self-pay | Admitting: Radiology

## 2013-11-21 ENCOUNTER — Encounter: Payer: Self-pay | Admitting: Internal Medicine

## 2013-11-21 ENCOUNTER — Ambulatory Visit (INDEPENDENT_AMBULATORY_CARE_PROVIDER_SITE_OTHER): Payer: Self-pay | Admitting: Internal Medicine

## 2013-11-21 ENCOUNTER — Other Ambulatory Visit: Payer: Self-pay | Admitting: Internal Medicine

## 2013-11-21 VITALS — BP 120/74 | HR 75 | Temp 97.8°F | Wt 262.0 lb

## 2013-11-21 DIAGNOSIS — E785 Hyperlipidemia, unspecified: Secondary | ICD-10-CM

## 2013-11-21 DIAGNOSIS — I209 Angina pectoris, unspecified: Secondary | ICD-10-CM

## 2013-11-21 DIAGNOSIS — E1140 Type 2 diabetes mellitus with diabetic neuropathy, unspecified: Secondary | ICD-10-CM

## 2013-11-21 DIAGNOSIS — E669 Obesity, unspecified: Secondary | ICD-10-CM

## 2013-11-21 DIAGNOSIS — F17219 Nicotine dependence, cigarettes, with unspecified nicotine-induced disorders: Secondary | ICD-10-CM

## 2013-11-21 DIAGNOSIS — Z23 Encounter for immunization: Secondary | ICD-10-CM

## 2013-11-21 LAB — MICROALBUMIN / CREATININE URINE RATIO
Creatinine,U: 209.5 mg/dL
MICROALB/CREAT RATIO: 0.7 mg/g (ref 0.0–30.0)
Microalb, Ur: 1.4 mg/dL (ref 0.0–1.9)

## 2013-11-21 LAB — HEMOGLOBIN A1C: Hgb A1c MFr Bld: 10.4 % — ABNORMAL HIGH (ref 4.6–6.5)

## 2013-11-21 LAB — COMPREHENSIVE METABOLIC PANEL
ALT: 29 U/L (ref 0–53)
AST: 20 U/L (ref 0–37)
Albumin: 3.3 g/dL — ABNORMAL LOW (ref 3.5–5.2)
Alkaline Phosphatase: 104 U/L (ref 39–117)
BUN: 9 mg/dL (ref 6–23)
CO2: 26 meq/L (ref 19–32)
CREATININE: 0.8 mg/dL (ref 0.4–1.5)
Calcium: 9.1 mg/dL (ref 8.4–10.5)
Chloride: 102 mEq/L (ref 96–112)
GFR: 116.26 mL/min (ref >60.00)
Glucose, Bld: 308 mg/dL — ABNORMAL HIGH (ref 70–99)
Potassium: 4 mEq/L (ref 3.5–5.1)
Sodium: 137 mEq/L (ref 135–145)
Total Bilirubin: 0.6 mg/dL (ref 0.2–1.2)
Total Protein: 7.1 g/dL (ref 6.0–8.3)

## 2013-11-21 LAB — LIPID PANEL
CHOLESTEROL: 132 mg/dL (ref 0–200)
HDL: 21.8 mg/dL — AB (ref >39.00)
LDL Cholesterol: 71 mg/dL (ref 0–99)
NonHDL: 110.2
TRIGLYCERIDES: 195 mg/dL — AB (ref 0.0–149.0)
Total CHOL/HDL Ratio: 6
VLDL: 39 mg/dL (ref 0.0–40.0)

## 2013-11-21 MED ORDER — CANAGLIFLOZIN 100 MG PO TABS: 1.0000 | ORAL_TABLET | Freq: Every day | ORAL | Status: DC

## 2013-11-21 MED ORDER — NITROGLYCERIN 0.4 MG SL SUBL: 0.4000 mg | SUBLINGUAL_TABLET | SUBLINGUAL | Status: DC | PRN

## 2013-11-21 NOTE — Assessment & Plan Note (Signed)
Discussed smoking cessation- he declines

## 2013-11-21 NOTE — Assessment & Plan Note (Signed)
Refuses to work on diet and exercise

## 2013-11-21 NOTE — Addendum Note (Signed)
Addended by: Roena Malady on: 11/21/2013 11:50 AM   Modules accepted: Orders

## 2013-11-21 NOTE — Assessment & Plan Note (Signed)
Will repeat A1C and microalbumin today Continue Metformin and Glyburide at this time Diet information given on low carb diet Encouraged him to start exercise Advised him not to mow or walk around in bare feet Encouraged him to get a diabetic eye exam yearly  Flu shot today

## 2013-11-21 NOTE — Progress Notes (Signed)
Subjective:    Patient ID: Martin Mullen, male    DOB: [birth year], 33 y.o.   MRN: [id number]  HPI  Pt presents to the clinic today for 3 month follow up of DM2 and HLD. His Metformin was increased to 1000 mg BID and he was started on Glyburide d/t A1C of 9.3%. He is tolerating this medication change well. He does not check his sugars. He is non compliant with diet and exercise. He does continue to smoke. He does not get yearly eye exams. He mows his grass in his bare feet. 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 He describes it as a constant burning sensation. He reports that he did not take the fish oil as directed. He is only taking his pravastatin.  Review of Systems      Past Medical History  Diagnosis Date  . 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  . Diabetes mellitus without complication   . Chicken pox   . Coronary artery disease     Inferior ST elevation myocardial infarction in July of 2014. Cardiac catheterization showed 95% distal RCA stenosis and 90% first diagonal stenosis with normal ejection fraction. He underwent PCI in bare-metal stent placement to the distal RCA.  Marland Kitchen Hypercholesterolemia   . Tobacco use     Current Outpatient Prescriptions  Medication Sig Dispense Refill  . aspirin 81 MG EC tablet Take 1 tablet (81 mg total) by mouth daily.  30 tablet  4  . clopidogrel (PLAVIX) 75 MG tablet Take 1 tablet (75 mg total) by mouth daily.  30 tablet  6  . glyBURIDE (DIABETA) 5 MG tablet Take 1 tablet (5 mg total) by mouth 2 (two) times daily with a meal.  60 tablet  2  . metFORMIN (GLUCOPHAGE) 1000 MG tablet Take 1 tablet (1,000 mg total) by mouth 2 (two) times daily with a meal.  60 tablet  2  . metoprolol tartrate (LOPRESSOR) 25 MG tablet Take 1 tablet (25 mg total) by mouth 2 (two) times daily.  60 tablet  4  . Multiple Vitamins-Minerals (MULTIVITAMIN PO) Take 1 capsule by mouth daily.      . nitroGLYCERIN (NITROSTAT) 0.4 MG  SL tablet Place 1 tablet (0.4 mg total) under the tongue every 5 (five) minutes x 3 doses as needed for chest pain.  30 tablet  12  . pravastatin (PRAVACHOL) 40 MG tablet 2 tablets in the evening after meals  60 tablet  4   No current facility-administered medications for this visit.    No Known Allergies  Family History  Problem Relation Age of Onset  . Arthritis Mother     Rheumatoid  . Diabetes Mother   . Hyperlipidemia Mother   . Diabetes Father   . Cancer Maternal Grandfather     Prostate  . Parkinson's disease Maternal Grandfather     History   Social History  . Marital Status: Married    Spouse Name: N/A    Number of Children: N/A  . Years of Education: N/A   Occupational History  . Not on file.   Social History Main Topics  . Smoking status: Current Every Day Smoker -- 1.00 packs/day for 20 years    Types: Cigarettes  . Smokeless tobacco: Not on file  . Alcohol Use: Yes     Comment: occasional  . Drug Use: Yes    Special: Cocaine  . Sexual Activity: Yes    Birth  Control/ Protection: None   Other Topics Concern  . Not on file   Social History Narrative  . No narrative on file     Constitutional: Denies fever, malaise, fatigue, headache or abrupt weight changes.  HEENT: Denies eye pain, eye redness, ear pain, ringing in the ears, wax buildup, runny nose, nasal congestion, bloody nose, or sore throat. Respiratory: Denies difficulty breathing, shortness of breath, cough or sputum production.   Cardiovascular: Denies chest pain, chest tightness, palpitations or swelling in the hands or feet.  Gastrointestinal: Denies abdominal pain, bloating, constipation, diarrhea or blood in the stool.  Skin: Denies redness, rashes, lesions or ulcercations.  Neurological: Denies dizziness, difficulty with memory, difficulty with speech or problems with balance and coordination.   No other specific complaints in a complete review of systems (except as listed in HPI  above).  Objective:   Physical Exam  BP 120/74  Pulse 75  Temp(Src) 97.8 F (36.6 C) (Oral)  Wt 262 lb (118.842 kg)  SpO2 97% Wt Readings from Last 3 Encounters:  11/21/13 262 lb (118.842 kg)  07/19/13 260 lb 4 oz (118.049 kg)  07/09/13 265 lb (120.203 kg)    General: Appears his stated age, obese but well developed, well nourished in NAD. Skin: Warm, dry and intact. No rashes, lesions or ulcerations noted. Cardiovascular: Normal rate and rhythm. S1,S2 noted.  No murmur, rubs or gallops noted. No JVD or BLE edema. No carotid bruits noted. Pulmonary/Chest: Normal effort and positive vesicular breath sounds. No respiratory distress. No wheezes, rales or ronchi noted.  Abdomen: Soft and nontender. Normal bowel sounds, no bruits noted. No distention or masses noted. Liver, spleen and kidneys non palpable. Neurological: Alert and oriented. Sensation intact.  BMET    Component Value Date/Time   NA 138 07/09/2013 1546   K 4.2 07/09/2013 1546   CL 102 07/09/2013 1546   CO2 27 07/09/2013 1546   GLUCOSE 235* 07/09/2013 1546   BUN 13 07/09/2013 1546   CREATININE 0.9 07/09/2013 1546   CALCIUM 9.6 07/09/2013 1546   GFRNONAA >90 10/02/2012 1346   GFRAA >90 10/02/2012 1346    Lipid Panel     Component Value Date/Time   CHOL 132 07/09/2013 1546   TRIG 279.0* 07/09/2013 1546   HDL 30.60* 07/09/2013 1546   CHOLHDL 4 07/09/2013 1546   VLDL 55.8* 07/09/2013 1546   LDLCALC 46 07/09/2013 1546    CBC    Component Value Date/Time   WBC 14.8* 07/09/2013 1546   RBC 4.98 07/09/2013 1546   HGB 16.2 07/09/2013 1546   HCT 47.5 07/09/2013 1546   PLT 232.0 07/09/2013 1546   MCV 95.3 07/09/2013 1546   MCH 33.5 10/02/2012 1346   MCHC 34.2 07/09/2013 1546   RDW 13.1 07/09/2013 1546    Hgb A1C Lab Results  Component Value Date   HGBA1C 9.3* 07/09/2013         Assessment & Plan:

## 2013-11-21 NOTE — Patient Instructions (Addendum)
Fat and Cholesterol Control Diet Fat and cholesterol levels in your blood and organs are influenced by your diet. High levels of fat and cholesterol may lead to diseases of the heart, small and large blood vessels, gallbladder, liver, and pancreas. CONTROLLING FAT AND CHOLESTEROL WITH DIET Although exercise and lifestyle factors are important, your diet is key. That is because certain foods are known to raise cholesterol and others to lower it. The goal is to balance foods for their effect on cholesterol and more importantly, to replace saturated and trans fat with other types of fat, such as monounsaturated fat, polyunsaturated fat, and omega-3 fatty acids. On average, a person should consume no more than 15 to 17 g of saturated fat daily. Saturated and trans fats are considered "bad" fats, and they will raise LDL cholesterol. Saturated fats are primarily found in animal products such as meats, butter, and cream. However, that does not mean you need to give up all your favorite foods. Today, there are good tasting, low-fat, low-cholesterol substitutes for most of the things you like to eat. Choose low-fat or nonfat alternatives. Choose round or loin cuts of red meat. These types of cuts are lowest in fat and cholesterol. Chicken (without the skin), fish, veal, and ground turkey breast are great choices. Eliminate fatty meats, such as hot dogs and salami. Even shellfish have little or no saturated fat. Have a 3 oz (85 g) portion when you eat lean meat, poultry, or fish. Trans fats are also called "partially hydrogenated oils." They are oils that have been scientifically manipulated so that they are solid at room temperature resulting in a longer shelf life and improved taste and texture of foods in which they are added. Trans fats are found in stick margarine, some tub margarines, cookies, crackers, and baked goods.  When baking and cooking, oils are a great substitute for butter. The monounsaturated oils are  especially beneficial since it is believed they lower LDL and raise HDL. The oils you should avoid entirely are saturated tropical oils, such as coconut and palm.  Remember to eat a lot from food groups that are naturally free of saturated and trans fat, including fish, fruit, vegetables, beans, grains (barley, rice, couscous, bulgur wheat), and pasta (without cream sauces).  IDENTIFYING FOODS THAT LOWER FAT AND CHOLESTEROL  Soluble fiber may lower your cholesterol. This type of fiber is found in fruits such as apples, vegetables such as broccoli, potatoes, and carrots, legumes such as beans, peas, and lentils, and grains such as barley. Foods fortified with plant sterols (phytosterol) may also lower cholesterol. You should eat at least 2 g per day of these foods for a cholesterol lowering effect.  Read package labels to identify low-saturated fats, trans fat free, and low-fat foods at the supermarket. Select cheeses that have only 2 to 3 g saturated fat per ounce. Use a heart-healthy tub margarine that is free of trans fats or partially hydrogenated oil. When buying baked goods (cookies, crackers), avoid partially hydrogenated oils. Breads and muffins should be made from whole grains (whole-wheat or whole oat flour, instead of "flour" or "enriched flour"). Buy non-creamy canned soups with reduced salt and no added fats.  FOOD PREPARATION TECHNIQUES  Never deep-fry. If you must fry, either stir-fry, which uses very little fat, or use non-stick cooking sprays. When possible, broil, bake, or roast meats, and steam vegetables. Instead of putting butter or margarine on vegetables, use lemon and herbs, applesauce, and cinnamon (for squash and sweet potatoes). Use nonfat   yogurt, salsa, and low-fat dressings for salads.  LOW-SATURATED FAT / LOW-FAT FOOD SUBSTITUTES Meats / Saturated Fat (g)  Avoid: Steak, marbled (3 oz/85 g) / 11 g  Choose: Steak, lean (3 oz/85 g) / 4 g  Avoid: Hamburger (3 oz/85 g) / 7  g  Choose: Hamburger, lean (3 oz/85 g) / 5 g  Avoid: Ham (3 oz/85 g) / 6 g  Choose: Ham, lean cut (3 oz/85 g) / 2.4 g  Avoid: Chicken, with skin, dark meat (3 oz/85 g) / 4 g  Choose: Chicken, skin removed, dark meat (3 oz/85 g) / 2 g  Avoid: Chicken, with skin, light meat (3 oz/85 g) / 2.5 g  Choose: Chicken, skin removed, light meat (3 oz/85 g) / 1 g Dairy / Saturated Fat (g)  Avoid: Whole milk (1 cup) / 5 g  Choose: Low-fat milk, 2% (1 cup) / 3 g  Choose: Low-fat milk, 1% (1 cup) / 1.5 g  Choose: Skim milk (1 cup) / 0.3 g  Avoid: Hard cheese (1 oz/28 g) / 6 g  Choose: Skim milk cheese (1 oz/28 g) / 2 to 3 g  Avoid: Cottage cheese, 4% fat (1 cup) / 6.5 g  Choose: Low-fat cottage cheese, 1% fat (1 cup) / 1.5 g  Avoid: Ice cream (1 cup) / 9 g  Choose: Sherbet (1 cup) / 2.5 g  Choose: Nonfat frozen yogurt (1 cup) / 0.3 g  Choose: Frozen fruit bar / trace  Avoid: Whipped cream (1 tbs) / 3.5 g  Choose: Nondairy whipped topping (1 tbs) / 1 g Condiments / Saturated Fat (g)  Avoid: Mayonnaise (1 tbs) / 2 g  Choose: Low-fat mayonnaise (1 tbs) / 1 g  Avoid: Butter (1 tbs) / 7 g  Choose: Extra light margarine (1 tbs) / 1 g  Avoid: Coconut oil (1 tbs) / 11.8 g  Choose: Olive oil (1 tbs) / 1.8 g  Choose: Corn oil (1 tbs) / 1.7 g  Choose: Safflower oil (1 tbs) / 1.2 g  Choose: Sunflower oil (1 tbs) / 1.4 g  Choose: Soybean oil (1 tbs) / 2.4 g  Choose: Canola oil (1 tbs) / 1 g Document Released: 01/31/2005 Document Revised: 05/28/2012 Document Reviewed: 05/01/2013 [employer] Patient Information 2015 [employer]. This information is not intended to replace advice given to you by your health care provider. Make sure you discuss any questions you have with your health care provider.  

## 2013-11-21 NOTE — Progress Notes (Signed)
Pre visit review using our clinic review tool, if applicable. No additional management support is needed unless otherwise documented below in the visit note. 

## 2013-11-21 NOTE — Assessment & Plan Note (Signed)
Will repeat lipid profile and CMET today Continue pravastatin

## 2013-11-22 ENCOUNTER — Telehealth: Payer: Self-pay | Admitting: Internal Medicine

## 2013-11-22 NOTE — Addendum Note (Signed)
Addended by: Roena Malady on: 11/22/2013 11:39 AM   Modules accepted: Orders

## 2013-11-22 NOTE — Telephone Encounter (Signed)
emmi smoking °

## 2013-12-04 ENCOUNTER — Other Ambulatory Visit: Payer: Self-pay

## 2013-12-04 MED ORDER — METFORMIN HCL 1000 MG PO TABS: 1000.0000 mg | ORAL_TABLET | Freq: Two times a day (BID) | ORAL | Status: DC

## 2013-12-04 MED ORDER — GLYBURIDE 5 MG PO TABS: 5.0000 mg | ORAL_TABLET | Freq: Two times a day (BID) | ORAL | Status: DC

## 2013-12-04 NOTE — Addendum Note (Signed)
Addended by: Roena Malady on: 12/04/2013 04:26 PM   Modules accepted: Orders

## 2013-12-13 ENCOUNTER — Other Ambulatory Visit: Payer: Self-pay | Admitting: Internal Medicine

## 2014-01-23 ENCOUNTER — Encounter (HOSPITAL_COMMUNITY): Payer: Self-pay | Admitting: Cardiology

## 2014-04-01 ENCOUNTER — Ambulatory Visit (INDEPENDENT_AMBULATORY_CARE_PROVIDER_SITE_OTHER): Payer: Self-pay | Admitting: Internal Medicine

## 2014-04-01 ENCOUNTER — Encounter: Payer: Self-pay | Admitting: Internal Medicine

## 2014-04-01 VITALS — BP 124/88 | HR 76 | Temp 97.8°F | Wt 256.0 lb

## 2014-04-01 DIAGNOSIS — F4323 Adjustment disorder with mixed anxiety and depressed mood: Secondary | ICD-10-CM

## 2014-04-01 DIAGNOSIS — E785 Hyperlipidemia, unspecified: Secondary | ICD-10-CM

## 2014-04-01 DIAGNOSIS — E1140 Type 2 diabetes mellitus with diabetic neuropathy, unspecified: Secondary | ICD-10-CM

## 2014-04-01 DIAGNOSIS — I213 ST elevation (STEMI) myocardial infarction of unspecified site: Secondary | ICD-10-CM

## 2014-04-01 DIAGNOSIS — I10 Essential (primary) hypertension: Secondary | ICD-10-CM

## 2014-04-01 DIAGNOSIS — E669 Obesity, unspecified: Secondary | ICD-10-CM

## 2014-04-01 DIAGNOSIS — I25810 Atherosclerosis of coronary artery bypass graft(s) without angina pectoris: Secondary | ICD-10-CM

## 2014-04-01 DIAGNOSIS — F17219 Nicotine dependence, cigarettes, with unspecified nicotine-induced disorders: Secondary | ICD-10-CM

## 2014-04-01 MED ORDER — GLYBURIDE 5 MG PO TABS: 5.0000 mg | ORAL_TABLET | Freq: Two times a day (BID) | ORAL | Status: DC

## 2014-04-01 MED ORDER — CLOPIDOGREL BISULFATE 75 MG PO TABS: 75.0000 mg | ORAL_TABLET | Freq: Every day | ORAL | Status: DC

## 2014-04-01 MED ORDER — METFORMIN HCL 1000 MG PO TABS: 1000.0000 mg | ORAL_TABLET | Freq: Two times a day (BID) | ORAL | Status: DC

## 2014-04-01 MED ORDER — METOPROLOL TARTRATE 25 MG PO TABS: 25.0000 mg | ORAL_TABLET | Freq: Two times a day (BID) | ORAL | Status: DC

## 2014-04-01 NOTE — Assessment & Plan Note (Signed)
Advised him to quit He is not motivated at this time

## 2014-04-01 NOTE — Assessment & Plan Note (Signed)
Encouraged him to work on diet and exercise He is not motivated

## 2014-04-01 NOTE — Assessment & Plan Note (Signed)
Not medicated Advised him to work on diet and exercise

## 2014-04-01 NOTE — Assessment & Plan Note (Signed)
Stable off meds. ?

## 2014-04-01 NOTE — Assessment & Plan Note (Signed)
No angina- has Nitro if needed Continue Pravastatin at this time LDL at goal

## 2014-04-01 NOTE — Assessment & Plan Note (Signed)
Continue Metoprolol LDL controlled on Pravastatin Continue Plavix Has not had to use Nitrostat Has follow up with cardiology in March Refilled Metoprolol today

## 2014-04-01 NOTE — Progress Notes (Signed)
Subjective:    Patient ID: Martin Mullen, male    DOB: 1981-01-07, 34 y.o.   MRN: [id number]  HPI  Pt presents to the clinic today for 3 month follow up of chronic medical conditions.    Anxiety and Depression: Improved. Not medicated at this time. Denies SI/HI.  DM 2, uncontrolled with neuropathy: His blood sugars have ranged from 160-240. He reports he is still taking the Metformin and Glyburide as presscribed but was unable to afford the Invokana. Eye exam was in 2014. Flu 11/2013. Pneumonia shot 08/2012. Foot exam 11/2013. He does not adhere to a low carb diet but he has managed to lose 7 lbs since I last saw him.  HTN: BP well controlled. Not medicated for HBP.   HLD: Denies myalgias on Pravastatin. Last LDL 71 11/2013. Does not adhere to a low fat diet.  CAD s/p STEMI with stent 08/2012. On Metoprolol and Plavix. No angina. Has not had to use Nitrostat. LDl at goal on statin.  Obesity: Has lost 7 lbs since last visit. Does not adhere to any specific diet, does not exercise.  Review of Systems  Past Medical History  Diagnosis Date  . 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  . Diabetes mellitus without complication   . Chicken pox   . Coronary artery disease     Inferior ST elevation myocardial infarction in July of 2014. Cardiac catheterization showed 95% distal RCA stenosis and 90% first diagonal stenosis with normal ejection fraction. He underwent PCI in bare-metal stent placement to the distal RCA.  Marland Kitchen Hypercholesterolemia   . Tobacco use     Current Outpatient Prescriptions  Medication Sig Dispense Refill  . aspirin 81 MG EC tablet Take 1 tablet (81 mg total) by mouth daily. 30 tablet 4  . Canagliflozin 100 MG TABS Take 1 tablet (100 mg total) by mouth daily. 30 tablet 2  . clopidogrel (PLAVIX) 75 MG tablet Take 1 tablet (75 mg total) by mouth daily. 30 tablet 6  . glyBURIDE (DIABETA) 5 MG tablet Take 1 tablet (5 mg total) by  mouth 2 (two) times daily with a meal. 60 tablet 2  . metFORMIN (GLUCOPHAGE) 1000 MG tablet Take 1 tablet (1,000 mg total) by mouth 2 (two) times daily with a meal. 60 tablet 2  . metoprolol tartrate (LOPRESSOR) 25 MG tablet Take 1 tablet (25 mg total) by mouth 2 (two) times daily. 60 tablet 4  . Multiple Vitamins-Minerals (MULTIVITAMIN PO) Take 1 capsule by mouth daily.    . nitroGLYCERIN (NITROSTAT) 0.4 MG SL tablet Place 1 tablet (0.4 mg total) under the tongue every 5 (five) minutes x 3 doses as needed for chest pain. 30 tablet 12  . pravastatin (PRAVACHOL) 40 MG tablet 2 tablets in the evening after meals 60 tablet 4   No current facility-administered medications for this visit.    No Known Allergies  Family History  Problem Relation Age of Onset  . Arthritis Mother     Rheumatoid  . Diabetes Mother   . Hyperlipidemia Mother   . Diabetes Father   . Cancer Maternal Grandfather     Prostate  . Parkinson's disease Maternal Grandfather     History   Social History  . Marital Status: Married    Spouse Name: N/A  . Number of Children: N/A  . Years of Education: N/A   Occupational History  . Not on file.   Social History Main  Topics  . Smoking status: Current Every Day Smoker -- 1.00 packs/day for 20 years    Types: Cigarettes  . Smokeless tobacco: Not on file  . Alcohol Use: Yes     Comment: occasional  . Drug Use: Yes    Special: Cocaine  . Sexual Activity: Yes    Birth Control/ Protection: None   Other Topics Concern  . Not on file   Social History Narrative     Constitutional: Denies fever, malaise, fatigue, headache or abrupt weight changes.  HEENT: Denies eye pain, eye redness, ear pain, ringing in the ears, wax buildup, runny nose, nasal congestion, bloody nose, or sore throat. Respiratory: Denies difficulty breathing, shortness of breath, cough or sputum production.   Cardiovascular: Denies chest pain, chest tightness, palpitations or swelling in the  hands or feet.  Gastrointestinal: Denies abdominal pain, bloating, constipation, diarrhea or blood in the stool.  Musculoskeletal: Denies decrease in range of motion, difficulty with gait, muscle pain or joint pain and swelling.  Skin: Denies redness, rashes, lesions or ulcercations.  Neurological: Pt reports tingling in BLE. Denies dizziness, difficulty with memory, difficulty with speech or problems with balance and coordination.  Psych: Pt reports history of anxiety and depression. Denies SI/HI.  No other specific complaints in a complete review of systems (except as listed in HPI above).     Objective:   Physical Exam   BP 124/88 mmHg  Pulse 76  Temp(Src) 97.8 F (36.6 C) (Oral)  Wt 256 lb (116.121 kg)  SpO2 97% Wt Readings from Last 3 Encounters:  04/01/14 256 lb (116.121 kg)  11/21/13 262 lb (118.842 kg)  07/19/13 260 lb 4 oz (118.049 kg)    General: Appears his stated age, obese in NAD, smells like smoke. Skin: Warm, dry and intact. No rashes, lesions or ulcerations noted. HEENT: Head: normal shape and size; Eyes: sclera white, no icterus, conjunctiva pink, PERRLA and EOMs intact;  Cardiovascular: Normal rate and rhythm. S1,S2 noted.  No murmur, rubs or gallops noted. No JVD or BLE edema. No carotid bruits noted. Pulmonary/Chest: Normal effort and positive vesicular breath sounds. No respiratory distress. No wheezes, rales or ronchi noted.  Neurological: Alert and oriented. Coordination normal.  Psychiatric: Mood and affect normal. Behavior is normal. Judgment and thought content delayed.     BMET    Component Value Date/Time   NA 137 11/21/2013 1044   K 4.0 11/21/2013 1044   CL 102 11/21/2013 1044   CO2 26 11/21/2013 1044   GLUCOSE 308* 11/21/2013 1044   BUN 9 11/21/2013 1044   CREATININE 0.8 11/21/2013 1044   CALCIUM 9.1 11/21/2013 1044   GFRNONAA >90 10/02/2012 1346   GFRAA >90 10/02/2012 1346    Lipid Panel     Component Value Date/Time   CHOL 132  11/21/2013 1044   TRIG 195.0* 11/21/2013 1044   HDL 21.80* 11/21/2013 1044   CHOLHDL 6 11/21/2013 1044   VLDL 39.0 11/21/2013 1044   LDLCALC 71 11/21/2013 1044    CBC    Component Value Date/Time   WBC 14.8* 07/09/2013 1546   RBC 4.98 07/09/2013 1546   HGB 16.2 07/09/2013 1546   HCT 47.5 07/09/2013 1546   PLT 232.0 07/09/2013 1546   MCV 95.3 07/09/2013 1546   MCH 33.5 10/02/2012 1346   MCHC 34.2 07/09/2013 1546   RDW 13.1 07/09/2013 1546    Hgb A1C Lab Results  Component Value Date   HGBA1C 10.4* 11/21/2013        Assessment &  Plan:

## 2014-04-01 NOTE — Assessment & Plan Note (Signed)
Uncontrolled Difficulty affording meds due to no insurance Continue Metformin and Glyburide-refilled today Flu and Pneumonia UTD Advised him to make an eye apt ASAP Foot exam today Will refer to endocrinology for further evaluation and management

## 2014-04-01 NOTE — Patient Instructions (Signed)
Diabetes Mellitus and Food It is important for you to manage your blood sugar (glucose) level. Your blood glucose level can be greatly affected by what you eat. Eating healthier foods in the appropriate amounts throughout the day at about the same time each day will help you control your blood glucose level. It can also help slow or prevent worsening of your diabetes mellitus. Healthy eating may even help you improve the level of your blood pressure and reach or maintain a healthy weight.  HOW CAN FOOD AFFECT ME? Carbohydrates Carbohydrates affect your blood glucose level more than any other type of food. Your dietitian will help you determine how many carbohydrates to eat at each meal and teach you how to count carbohydrates. Counting carbohydrates is important to keep your blood glucose at a healthy level, especially if you are using insulin or taking certain medicines for diabetes mellitus. Alcohol Alcohol can cause sudden decreases in blood glucose (hypoglycemia), especially if you use insulin or take certain medicines for diabetes mellitus. Hypoglycemia can be a life-threatening condition. Symptoms of hypoglycemia (sleepiness, dizziness, and disorientation) are similar to symptoms of having too much alcohol.  If your health care provider has given you approval to drink alcohol, do so in moderation and use the following guidelines:  Women should not have more than one drink per day, and men should not have more than two drinks per day. One drink is equal to:  12 oz of beer.  5 oz of wine.  1 oz of hard liquor.  Do not drink on an empty stomach.  Keep yourself hydrated. Have water, diet soda, or unsweetened iced tea.  Regular soda, juice, and other mixers might contain a lot of carbohydrates and should be counted. WHAT FOODS ARE NOT RECOMMENDED? As you make food choices, it is important to remember that all foods are not the same. Some foods have fewer nutrients per serving than other  foods, even though they might have the same number of calories or carbohydrates. It is difficult to get your body what it needs when you eat foods with fewer nutrients. Examples of foods that you should avoid that are high in calories and carbohydrates but low in nutrients include:  Trans fats (most processed foods list trans fats on the Nutrition Facts label).  Regular soda.  Juice.  Candy.  Sweets, such as cake, pie, doughnuts, and cookies.  Fried foods. WHAT FOODS CAN I EAT? Have nutrient-rich foods, which will nourish your body and keep you healthy. The food you should eat also will depend on several factors, including:  The calories you need.  The medicines you take.  Your weight.  Your blood glucose level.  Your blood pressure level.  Your cholesterol level. You also should eat a variety of foods, including:  Protein, such as meat, poultry, fish, tofu, nuts, and seeds (lean animal proteins are best).  Fruits.  Vegetables.  Dairy products, such as milk, cheese, and yogurt (low fat is best).  Breads, grains, pasta, cereal, rice, and beans.  Fats such as olive oil, trans fat-free margarine, canola oil, avocado, and olives. DOES EVERYONE WITH DIABETES MELLITUS HAVE THE SAME MEAL PLAN? Because every person with diabetes mellitus is different, there is not one meal plan that works for everyone. It is very important that you meet with a dietitian who will help you create a meal plan that is just right for you. Document Released: 10/28/2004 Document Revised: 02/05/2013 Document Reviewed: 12/28/2012 [employer] Patient Information 2015 [employer]. This   information is not intended to replace advice given to you by your health care provider. Make sure you discuss any questions you have with your health care provider.  

## 2014-04-01 NOTE — Progress Notes (Signed)
Pre visit review using our clinic review tool, if applicable. No additional management support is needed unless otherwise documented below in the visit note. 

## 2014-04-01 NOTE — Assessment & Plan Note (Signed)
LDL at goal on Pravastatin Will continue for now

## 2014-04-07 ENCOUNTER — Other Ambulatory Visit: Payer: Self-pay | Admitting: Internal Medicine

## 2014-04-22 ENCOUNTER — Ambulatory Visit: Payer: Self-pay | Admitting: Endocrinology

## 2014-05-11 ENCOUNTER — Emergency Department: Payer: Self-pay | Admitting: Emergency Medicine

## 2014-05-19 ENCOUNTER — Telehealth: Payer: Self-pay | Admitting: Primary Care

## 2014-05-19 ENCOUNTER — Ambulatory Visit (INDEPENDENT_AMBULATORY_CARE_PROVIDER_SITE_OTHER)
Admission: RE | Admit: 2014-05-19 | Discharge: 2014-05-19 | Disposition: A | Discharge status: Home / Self Care | Payer: Self-pay | Source: Ambulatory Visit | Attending: Primary Care | Admitting: Primary Care

## 2014-05-19 ENCOUNTER — Encounter: Payer: Self-pay | Admitting: Primary Care

## 2014-05-19 ENCOUNTER — Ambulatory Visit (INDEPENDENT_AMBULATORY_CARE_PROVIDER_SITE_OTHER): Payer: Self-pay | Admitting: Primary Care

## 2014-05-19 VITALS — BP 130/78 | HR 87 | Temp 98.5°F | Ht 69.5 in | Wt 254.0 lb

## 2014-05-19 DIAGNOSIS — R0781 Pleurodynia: Secondary | ICD-10-CM

## 2014-05-19 MED ORDER — TRAMADOL HCL 50 MG PO TABS
50.0000 mg | ORAL_TABLET | Freq: Three times a day (TID) | ORAL | Status: DC | PRN
Start: 2014-05-19 — End: 2015-03-05

## 2014-05-19 MED ORDER — CYCLOBENZAPRINE HCL 5 MG PO TABS: 5.0000 mg | ORAL_TABLET | Freq: Three times a day (TID) | ORAL | Status: DC | PRN

## 2014-05-19 NOTE — Progress Notes (Signed)
Subjective:    Patient ID: Martin Mullen, male    DOB: 06-15-1980, 34 y.o.   MRN: [id number]  HPI  Martin Mullen is a 34 year old male who presents today with a chief complaint of right upper quadrant rib pain that has been present for 9 days. The pain initially began as a sudden onset after his wife sat on his back in an attempt to "pop" his back. She sat and started rocking back and fowarth which is when he heard a "pop" and felt a sudden onset of sharp pain. He presented to the emergency department at [facility] on 3/27 for further evaluation. Xray was negative for fracture or dislocation of his ribs. He was provided scripts for ibuprofen (which he cannot take) and percocet. He ran out of percocet 3 days ago and has been using tylenol which has helped temporarily. About 4 days ago his pain began to worsen and developed some shortness of breath. Denies nausea, vomiting, fevers, chills, pain with eating or drinking, denies trauma since initial incident.  Review of Systems  Constitutional: Negative for fever.  Respiratory: Positive for shortness of breath.        Chest wall soreness   Cardiovascular: Negative for chest pain.  Gastrointestinal: Negative for nausea, vomiting, abdominal pain, diarrhea, constipation and abdominal distention.  Musculoskeletal: Positive for myalgias. Negative for back pain.  Skin: Negative for color change.       Past Medical History  Diagnosis Date  . 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  . Diabetes mellitus without complication   . Chicken pox   . Coronary artery disease     Inferior ST elevation myocardial infarction in July of 2014. Cardiac catheterization showed 95% distal RCA stenosis and 90% first diagonal stenosis with normal ejection fraction. He underwent PCI in bare-metal stent placement to the distal RCA.  Marland Kitchen Hypercholesterolemia   . Tobacco use     History   Social History  . Marital Status: Married   Spouse Name: N/A  . Number of Children: N/A  . Years of Education: N/A   Occupational History  . Not on file.   Social History Main Topics  . Smoking status: Current Every Day Smoker -- 1.00 packs/day for 20 years    Types: Cigarettes  . Smokeless tobacco: Not on file  . Alcohol Use: 0.0 oz/week    0 Standard drinks or equivalent per week     Comment: occasional  . Drug Use: Yes    Special: Cocaine  . Sexual Activity: Yes    Birth Control/ Protection: None   Other Topics Concern  . Not on file   Social History Narrative    Past Surgical History  Procedure Laterality Date  . Coronary angioplasty with stent placement    . Left heart catheterization with coronary angiogram N/A 08/28/2012    Procedure: LEFT HEART CATHETERIZATION WITH CORONARY ANGIOGRAM;  Surgeon: Pamella Pert, MD;  Location: [facility] CATH LAB;  Service: Cardiovascular;  Laterality: N/A;    Family History  Problem Relation Age of Onset  . Arthritis Mother     Rheumatoid  . Diabetes Mother   . Hyperlipidemia Mother   . Diabetes Father   . Cancer Maternal Grandfather     Prostate  . Parkinson's disease Maternal Grandfather     No Known Allergies  Current Outpatient Prescriptions on File Prior to Visit  Medication Sig Dispense Refill  . aspirin 81 MG EC  tablet Take 1 tablet (81 mg total) by mouth daily. 30 tablet 4  . Canagliflozin 100 MG TABS Take 1 tablet (100 mg total) by mouth daily. 30 tablet 2  . clopidogrel (PLAVIX) 75 MG tablet Take 1 tablet (75 mg total) by mouth daily. 30 tablet 5  . glyBURIDE (DIABETA) 5 MG tablet Take 1 tablet (5 mg total) by mouth 2 (two) times daily with a meal. 60 tablet 2  . metFORMIN (GLUCOPHAGE) 1000 MG tablet Take 1 tablet (1,000 mg total) by mouth 2 (two) times daily with a meal. 60 tablet 2  . metoprolol tartrate (LOPRESSOR) 25 MG tablet Take 1 tablet (25 mg total) by mouth 2 (two) times daily. 60 tablet 5  . Multiple Vitamins-Minerals (MULTIVITAMIN PO) Take 1 capsule  by mouth daily.    . nitroGLYCERIN (NITROSTAT) 0.4 MG SL tablet Place 1 tablet (0.4 mg total) under the tongue every 5 (five) minutes x 3 doses as needed for chest pain. 30 tablet 12  . pravastatin (PRAVACHOL) 40 MG tablet 2 tablets in the evening after meals 60 tablet 4   No current facility-administered medications on file prior to visit.    BP 130/78 mmHg  Pulse 87  Temp(Src) 98.5 F (36.9 C) (Oral)  Ht 5' 9.5" (1.765 m)  Wt 254 lb (115.214 kg)  BMI 36.98 kg/m2  SpO2 98%    Objective:   Physical Exam  Constitutional: He is oriented to person, place, and time.  Neck: Neck supple.  Cardiovascular: Normal rate and regular rhythm.   Pulmonary/Chest: Effort normal and breath sounds normal.  Moderately tender to lower right anterior ribs proximal to gall bladder.  Abdominal: Soft. Bowel sounds are normal. There is no tenderness.  Musculoskeletal: Normal range of motion. He exhibits tenderness.  Lymphadenopathy:    He has no cervical adenopathy.  Neurological: He is alert and oriented to person, place, and time.  Skin: Skin is warm and dry.          Assessment & Plan:  DG unilateral right ribs negative for fracture. No obvious lung collapse. Tylenol for minor pain, Tramadol for severe pain, flexeril for muscle spasms. I explained that this is likely MSK in nature and that it will take several weeks to heal. Follow up if symptoms worsen, especially the SOB, or follow up if no improvement in 2 weeks.

## 2014-05-19 NOTE — Patient Instructions (Signed)
Obtain xray prior to leaving today. You may take Tramadol three times daily as needed for pain. You may take Flexeril (muscle relaxer) three times daily as needed. We will notify you of your results. I hope you feel better soon!  Muscle Cramps and Spasms Muscle cramps and spasms occur when a muscle or muscles tighten and you have no control over this tightening (involuntary muscle contraction). They are a common problem and can develop in any muscle. The most common place is in the calf muscles of the leg. Both muscle cramps and muscle spasms are involuntary muscle contractions, but they also have differences:   Muscle cramps are sporadic and painful. They may last a few seconds to a quarter of an hour. Muscle cramps are often more forceful and last longer than muscle spasms.  Muscle spasms may or may not be painful. They may also last just a few seconds or much longer. CAUSES  It is uncommon for cramps or spasms to be due to a serious underlying problem. In many cases, the cause of cramps or spasms is unknown. Some common causes are:   Overexertion.   Overuse from repetitive motions (doing the same thing over and over).   Remaining in a certain position for a long period of time.   Improper preparation, form, or technique while performing a sport or activity.   Dehydration.   Injury.   Side effects of some medicines.   Abnormally low levels of the salts and ions in your blood (electrolytes), especially potassium and calcium. This could happen if you are taking water pills (diuretics) or you are pregnant.  Some underlying medical problems can make it more likely to develop cramps or spasms. These include, but are not limited to:   Diabetes.   Parkinson disease.   Hormone disorders, such as thyroid problems.   Alcohol abuse.   Diseases specific to muscles, joints, and bones.   Blood vessel disease where not enough blood is getting to the muscles.  HOME CARE  INSTRUCTIONS   Stay well hydrated. Drink enough water and fluids to keep your urine clear or pale yellow.  It may be helpful to massage, stretch, and relax the affected muscle.  For tight or tense muscles, use a warm towel, heating pad, or hot shower water directed to the affected area.  If you are sore or have pain after a cramp or spasm, applying ice to the affected area may relieve discomfort.  Put ice in a plastic bag.  Place a towel between your skin and the bag.  Leave the ice on for 15-20 minutes, 03-04 times a day.  Medicines used to treat a known cause of cramps or spasms may help reduce their frequency or severity. Only take over-the-counter or prescription medicines as directed by your caregiver. SEEK MEDICAL CARE IF:  Your cramps or spasms get more severe, more frequent, or do not improve over time.  MAKE SURE YOU:   Understand these instructions.  Will watch your condition.  Will get help right away if you are not doing well or get worse. Document Released: 07/23/2001 Document Revised: 05/28/2012 Document Reviewed: 01/18/2012 [facility] Patient Information 2015 [place], [place]. This information is not intended to replace advice given to you by your health care provider. Make sure you discuss any questions you have with your health care provider.

## 2014-05-19 NOTE — Telephone Encounter (Signed)
Will you please notify Mr. Nutt that his rib xrays were negative for fracture or a collapse in his lung. He should start the medication as discussed this afternoon, and follow up if no improvement in 2-3 weeks or sooner if shortness of breath worsens.   Thank you

## 2014-05-20 NOTE — Telephone Encounter (Signed)
Called patient and the patient's wife answer the phone. Patient is asleep and ask about the x-ray. Notified her of Kate's comments. She verbalized understanding.

## 2014-06-06 NOTE — H&P (Signed)
   Subjective/Chief Complaint scrotal pain   History of Present Illness multiple episodes, rcent I&D at [facility] ED 4 days worsening pain chills, no fevers   Past History PMH mult scrotal abscesses   Past Med/Surgical Hx:  MRSA:   wrist surgery:   ALLERGIES:  No Known Allergies:   Family and Social History:  Family History Non-Contributory   Social History positive  tobacco, negative ETOH   + Tobacco Current (within 1 year)   Place of Living Home   Review of Systems:  Fever/Chills Yes   Cough No   Abdominal Pain No   Diarrhea No   Constipation No   Nausea/Vomiting No   SOB/DOE No   Chest Pain No   Dysuria No   Tolerating Diet Yes  ate at 2100 tonight   Physical Exam:  GEN no acute distress   HEENT pink conjunctivae   NECK supple   RESP normal resp effort  clear BS   CARD regular rate   ABD denies tenderness   GU 4-5 cm indurated, erythematous scrotal abscess rt side   LYMPH negative neck   SKIN normal to palpation   PSYCH alert, A+O to time, place, person, good insight    Assessment/Admission Diagnosis scrotal abscess admit IV abx, drain in OR risks and options rev'd agrees with plan   Electronic Signatures: Lattie Haw (MD)  (Signed 25-Jun-14 00:11)  Authored: CHIEF COMPLAINT and HISTORY, PAST MEDICAL/SURGIAL HISTORY, ALLERGIES, FAMILY AND SOCIAL HISTORY, REVIEW OF SYSTEMS, PHYSICAL EXAM, ASSESSMENT AND PLAN   Last Updated: 25-Jun-14 00:11 by Lattie Haw (MD)

## 2014-06-06 NOTE — H&P (Signed)
PATIENT NAME:  Martin Mullen, Martin Mullen MR#:  [id number] DATE OF BIRTH:  [birth year]  DATE OF ADMISSION:  08/07/2012  CHIEF COMPLAINT:  Scrotal pain.   HISTORY OF PRESENT ILLNESS:  This is a patient with multiple episodes of scrotal pain and scrotal abscesses. He has never been told that he had MRSA, but has been treated with Bactrim multiple times. He has had at least 5 to 6 incision and drainages performed in the Emergency Room at [facility] in spite of being told that the Emergency Room physicians do not do that at least on this occasion. He has also had a formal incision and drainage in the operating room at [facility] on one occasion. He is currently not on antibiotics. His most recent incision and drainage was at [facility] Emergency Room 4 to 6 months ago. He and his wife state that they are very well versed in wound care including packing, etc. He states that he had chills, but no fevers. He did not take his temperature. He has had multiple episodes like this, however.   PAST MEDICAL HISTORY:  Denies hypertension, diabetes.   PAST SURGICAL HISTORY:  Wrist surgery and multiple drainages of scrotal abscesses.   ALLERGIES:  None.   MEDICATIONS:  None.   FAMILY HISTORY:  Noncontributory other than the fact that he has multiple family members with diabetes.   SOCIAL HISTORY:  The patient works in a casino-type environment. He does smoke tobacco products. Does not drink alcohol.   REVIEW OF SYSTEMS:  A 10 system review has been performed and negative with the exception of that mentioned in the HPI.   PHYSICAL EXAMINATION: GENERAL: Healthy-appearing Caucasian male patient.  VITAL SIGNS: He is afebrile. Vital signs are stable.  HEENT: No scleral icterus.  NECK: No palpable neck nodes.  CHEST: Clear to auscultation.  CARDIAC: Regular rate and rhythm.  ABDOMEN: Soft, nontender.  EXTREMITIES: Without edema.  NEUROLOGIC: Grossly intact.  INTEGUMENT: No  jaundice.  GENITOURINARY: Demonstrates a 4 or 5 cm indurated, fluctuant and erythematous tender mass in the right hemiscrotum with no drainage.   LABORATORY DATA:  No labs are available and none have been drawn by the Emergency Room.   ASSESSMENT AND PLAN:  This is a patient with a scrotal abscess. I was asked to see the patient in the office and he was to be sent home on oral antibiotics; however, the physician assistant stated that he did have an abscess, but was not going to drain it. Therefore, I suggested I see him in the Emergency Room and consider admission. I believe that this is the proper course for this patient with a 4 or 5 cm abscess that should be drained in the operating room due to its size and he will be started on intravenous antibiotics. He ate dinner tonight at 9:00 prior to coming to the Emergency Room and will need to be n.p.o. I will discuss this case with Dr. Egbert Garibaldi who will perform the operation tomorrow and schedule him. I have discussed with the patient and his significant other the rationale for this approach and the options of observation or oral antibiotics and the risks of bleeding, infection, open wound and cosmetic deformity as well as recurrence. They understood and agreed to proceed. They are well versed in this disease and have had it happen multiple times.    ____________________________ Adah Salvage Excell Seltzer, MD rec:si D: 08/08/2012 00:17:04 ET T: 08/08/2012 00:29:12 ET JOB#: [id number]  cc: Adah Salvage. Excell Seltzer,  MD, <Dictator> Lattie Haw MD ELECTRONICALLY SIGNED 08/08/2012 2:09

## 2014-06-06 NOTE — Op Note (Signed)
PATIENT NAME:  Martin Mullen, Martin Mullen MR#:  [id number] DATE OF BIRTH:  1980-04-28  DATE OF PROCEDURE:  08/08/2012  PREOPERATIVE DIAGNOSIS: Recurrent right scrotal abscess.  POSTOPERATIVE DIAGNOSIS: Recurrent right scrotal abscess.   PROCEDURE PERFORMED: Incision and drainage of right scrotal abscess. Examination under anesthesia.   SURGEON: Natale Lay, M.D. FACS  ANESTHESIA: General.   FINDINGS: Brown creamy Pus. about 20 cc total.  SPECIMENS: Pus.   DESCRIPTION OF PROCEDURE: With informed consent, supine position, general endotracheal anesthesia, the patient was padded and positioned in dorsal lithotomy. The perineum and scrotum were sterilely prepped and draped with Betadine. Timeout was observed.   There was a 5 cm indurated mass in the spermatic cord on the right upper scrotal area. It was distinct from the testes. Left side unremarkable. Glans of the penis unremarkable as well as the right groin. An aliquot of pus was aspirated from the mass utilizing an 18-gauge needle. Specimen was submitted for micro-bacteriological analysis.   An incision was fashioned with electrocautery overlying the mass and further pus was extruded. Counter incision was made superiorly on the mass. Loculations were disrupted. A 0.25 inch Penrose drain was placed between these two sites and secured with 3-0 nylon suture at the skin. Bulky occlusive dressing was placed. The patient was then subsequently extubated and taken to the recovery room in stable and satisfactory condition by anesthesia services.    ____________________________ Redge Gainer Egbert Garibaldi, MD FACS mab:aw D: 08/08/2012 10:17:22 ET T: 08/08/2012 11:33:03 ET JOB#: [id number]  cc: Loraine Leriche A. Egbert Garibaldi, MD, <Dictator> Raynald Kemp MD ELECTRONICALLY SIGNED 08/08/2012 12:13

## 2014-06-06 NOTE — Discharge Summary (Signed)
PATIENT NAME:  Martin Mullen, Martin Mullen MR#:  [id number] DATE OF BIRTH:  Jul 07, 1980  DATE OF ADMISSION:  08/08/2012 DATE OF DISCHARGE:  08/08/2012  FINAL DIAGNOSIS: Right groin abscess.   PRINCIPLE PROCEDURES: Incision and drainage of right groin abscess.   HOSPITAL COURSE SUMMARY: The patient was admitted by Dr. Excell Seltzer on 08/07/2012 with right groin abscess. He was taken to the operating room by me the following day where incision and drainage and examination under anesthesia was performed. Penrose drain was placed. Postoperatively, the patient did well. He was discharged home later that same day in stable condition.   DISCHARGE MEDICATIONS: 1.  Keflex 500 mg by mouth q.i.d. for 7 days.  2.  Norco 5/325 mg 1 tab every 6 hours as needed for pain.   DISCHARGE INSTRUCTIONS:  He will follow up in my office in [place] location in 7 to 10 days. Call with any questions or concerns or fever.  ____________________________ Redge Gainer Egbert Garibaldi, MD mab:sb D: 08/24/2012 02:49:00 ET T: 08/24/2012 09:04:04 ET JOB#: [id number]  cc: Loraine Leriche A. Egbert Garibaldi, MD, <Dictator> Raynald Kemp MD ELECTRONICALLY SIGNED 08/26/2012 21:57

## 2014-09-30 ENCOUNTER — Telehealth: Payer: Self-pay | Admitting: Internal Medicine

## 2014-09-30 ENCOUNTER — Ambulatory Visit: Payer: Self-pay | Admitting: Internal Medicine

## 2014-09-30 DIAGNOSIS — Z0289 Encounter for other administrative examinations: Secondary | ICD-10-CM

## 2014-09-30 NOTE — Telephone Encounter (Signed)
Yes he needs to follow up 

## 2014-09-30 NOTE — Telephone Encounter (Signed)
L/m for pt to return call

## 2014-09-30 NOTE — Telephone Encounter (Signed)
Patient did not come in for their appointment today for 6 mo follow up  Please let me know if patient needs to be contacted immediately for follow up or no follow up needed. °

## 2014-10-02 NOTE — Telephone Encounter (Signed)
L/m to return call to rs appt

## 2014-10-09 NOTE — Telephone Encounter (Signed)
Sent letter regarding missed appt

## 2014-10-09 NOTE — Telephone Encounter (Signed)
L/m for pt to return call

## 2015-02-18 ENCOUNTER — Other Ambulatory Visit: Payer: Self-pay | Admitting: Internal Medicine

## 2015-03-05 ENCOUNTER — Observation Stay
Admission: EM | Admit: 2015-03-05 | Discharge: 2015-03-06 | Disposition: A | Discharge status: Home / Self Care | Payer: BLUE CROSS/BLUE SHIELD | Attending: Specialist | Admitting: Specialist

## 2015-03-05 ENCOUNTER — Emergency Department: Payer: BLUE CROSS/BLUE SHIELD

## 2015-03-05 ENCOUNTER — Encounter: Payer: Self-pay | Admitting: Emergency Medicine

## 2015-03-05 DIAGNOSIS — Z955 Presence of coronary angioplasty implant and graft: Secondary | ICD-10-CM | POA: Insufficient documentation

## 2015-03-05 DIAGNOSIS — Z833 Family history of diabetes mellitus: Secondary | ICD-10-CM | POA: Insufficient documentation

## 2015-03-05 DIAGNOSIS — R079 Chest pain, unspecified: Secondary | ICD-10-CM | POA: Diagnosis not present

## 2015-03-05 DIAGNOSIS — E1165 Type 2 diabetes mellitus with hyperglycemia: Secondary | ICD-10-CM | POA: Diagnosis not present

## 2015-03-05 DIAGNOSIS — Z794 Long term (current) use of insulin: Secondary | ICD-10-CM | POA: Diagnosis not present

## 2015-03-05 DIAGNOSIS — F4323 Adjustment disorder with mixed anxiety and depressed mood: Secondary | ICD-10-CM | POA: Diagnosis not present

## 2015-03-05 DIAGNOSIS — I10 Essential (primary) hypertension: Secondary | ICD-10-CM | POA: Diagnosis not present

## 2015-03-05 DIAGNOSIS — E669 Obesity, unspecified: Secondary | ICD-10-CM | POA: Insufficient documentation

## 2015-03-05 DIAGNOSIS — Z820 Family history of epilepsy and other diseases of the nervous system: Secondary | ICD-10-CM | POA: Insufficient documentation

## 2015-03-05 DIAGNOSIS — E7800 Pure hypercholesterolemia, unspecified: Secondary | ICD-10-CM | POA: Insufficient documentation

## 2015-03-05 DIAGNOSIS — Z7984 Long term (current) use of oral hypoglycemic drugs: Secondary | ICD-10-CM | POA: Insufficient documentation

## 2015-03-05 DIAGNOSIS — E1140 Type 2 diabetes mellitus with diabetic neuropathy, unspecified: Secondary | ICD-10-CM | POA: Diagnosis not present

## 2015-03-05 DIAGNOSIS — Z79899 Other long term (current) drug therapy: Secondary | ICD-10-CM | POA: Insufficient documentation

## 2015-03-05 DIAGNOSIS — Z7982 Long term (current) use of aspirin: Secondary | ICD-10-CM | POA: Diagnosis not present

## 2015-03-05 DIAGNOSIS — Z23 Encounter for immunization: Secondary | ICD-10-CM | POA: Insufficient documentation

## 2015-03-05 DIAGNOSIS — F17210 Nicotine dependence, cigarettes, uncomplicated: Secondary | ICD-10-CM | POA: Insufficient documentation

## 2015-03-05 DIAGNOSIS — I200 Unstable angina: Secondary | ICD-10-CM | POA: Diagnosis not present

## 2015-03-05 DIAGNOSIS — Z8261 Family history of arthritis: Secondary | ICD-10-CM | POA: Diagnosis not present

## 2015-03-05 DIAGNOSIS — I25110 Atherosclerotic heart disease of native coronary artery with unstable angina pectoris: Secondary | ICD-10-CM | POA: Insufficient documentation

## 2015-03-05 DIAGNOSIS — Z7951 Long term (current) use of inhaled steroids: Secondary | ICD-10-CM | POA: Diagnosis not present

## 2015-03-05 DIAGNOSIS — I252 Old myocardial infarction: Secondary | ICD-10-CM | POA: Diagnosis not present

## 2015-03-05 DIAGNOSIS — Z8042 Family history of malignant neoplasm of prostate: Secondary | ICD-10-CM | POA: Insufficient documentation

## 2015-03-05 DIAGNOSIS — Z9861 Coronary angioplasty status: Secondary | ICD-10-CM | POA: Insufficient documentation

## 2015-03-05 DIAGNOSIS — Z6830 Body mass index (BMI) 30.0-30.9, adult: Secondary | ICD-10-CM | POA: Diagnosis not present

## 2015-03-05 DIAGNOSIS — I209 Angina pectoris, unspecified: Secondary | ICD-10-CM

## 2015-03-05 LAB — GLUCOSE, CAPILLARY
Glucose-Capillary: 293 mg/dL — ABNORMAL HIGH (ref 65–99)
Glucose-Capillary: 439 mg/dL — ABNORMAL HIGH (ref 65–99)

## 2015-03-05 LAB — BASIC METABOLIC PANEL
Anion gap: 9 (ref 5–15)
BUN: 10 mg/dL (ref 6–20)
CO2: 26 mmol/L (ref 22–32)
Calcium: 9.2 mg/dL (ref 8.9–10.3)
Chloride: 98 mmol/L — ABNORMAL LOW (ref 101–111)
Creatinine, Ser: 0.78 mg/dL (ref 0.61–1.24)
GFR calc Af Amer: >60 mL/min (ref >60)
GFR calc non Af Amer: >60 mL/min (ref >60)
Glucose, Bld: 404 mg/dL — ABNORMAL HIGH (ref 65–99)
POTASSIUM: 4.4 mmol/L (ref 3.5–5.1)
SODIUM: 133 mmol/L — AB (ref 135–145)

## 2015-03-05 LAB — CBC
HCT: 53 % — ABNORMAL HIGH (ref 40.0–52.0)
HEMOGLOBIN: 18.2 g/dL — AB (ref 13.0–18.0)
MCH: 32.2 pg (ref 26.0–34.0)
MCHC: 34.3 g/dL (ref 32.0–36.0)
MCV: 93.9 fL (ref 80.0–100.0)
Platelets: 197 10*3/uL (ref 150–440)
RBC: 5.65 MIL/uL (ref 4.40–5.90)
RDW: 13.1 % (ref 11.5–14.5)
WBC: 12.5 10*3/uL — ABNORMAL HIGH (ref 3.8–10.6)

## 2015-03-05 LAB — TROPONIN I: Troponin I: <0.03 ng/mL (ref <0.031)

## 2015-03-05 MED ORDER — INSULIN ASPART 100 UNIT/ML ~~LOC~~ SOLN
0.0000 [IU] | Freq: Three times a day (TID) | SUBCUTANEOUS | Status: DC
  Administered 2015-03-05: 5 [IU] via SUBCUTANEOUS
  Filled 2015-03-05: qty 5

## 2015-03-05 MED ORDER — NITROGLYCERIN 0.4 MG SL SUBL: 0.4000 mg | SUBLINGUAL_TABLET | SUBLINGUAL | Status: DC | PRN

## 2015-03-05 MED ORDER — INSULIN ASPART 100 UNIT/ML ~~LOC~~ SOLN: 0.0000 [IU] | Freq: Every day | SUBCUTANEOUS | Status: DC

## 2015-03-05 MED ORDER — NICOTINE 21 MG/24HR TD PT24
21.0000 mg | MEDICATED_PATCH | Freq: Every day | TRANSDERMAL | Status: DC
  Administered 2015-03-05 – 2015-03-06 (×2): 21 mg via TRANSDERMAL
  Filled 2015-03-05 (×2): qty 1

## 2015-03-05 MED ORDER — HEPARIN SODIUM (PORCINE) 5000 UNIT/ML IJ SOLN
5000.0000 [IU] | Freq: Three times a day (TID) | INTRAMUSCULAR | Status: DC
  Administered 2015-03-05 – 2015-03-06 (×3): 5000 [IU] via SUBCUTANEOUS
  Filled 2015-03-05 (×4): qty 1

## 2015-03-05 MED ORDER — ACETAMINOPHEN 325 MG PO TABS: 650.0000 mg | ORAL_TABLET | ORAL | Status: DC | PRN

## 2015-03-05 MED ORDER — ASPIRIN EC 81 MG PO TBEC
81.0000 mg | DELAYED_RELEASE_TABLET | Freq: Every day | ORAL | Status: DC
  Administered 2015-03-06: 81 mg via ORAL
  Filled 2015-03-05: qty 1

## 2015-03-05 MED ORDER — INFLUENZA VAC SPLIT QUAD 0.5 ML IM SUSY
0.5000 mL | PREFILLED_SYRINGE | INTRAMUSCULAR | Status: AC
  Administered 2015-03-06: 0.5 mL via INTRAMUSCULAR
  Filled 2015-03-05: qty 0.5

## 2015-03-05 MED ORDER — ONDANSETRON HCL 4 MG/2ML IJ SOLN: 4.0000 mg | Freq: Four times a day (QID) | INTRAMUSCULAR | Status: DC | PRN

## 2015-03-05 MED ORDER — ROSUVASTATIN CALCIUM 20 MG PO TABS
20.0000 mg | ORAL_TABLET | Freq: Every day | ORAL | Status: DC
  Administered 2015-03-05: 20 mg via ORAL
  Filled 2015-03-05: qty 1

## 2015-03-05 MED ORDER — CLOPIDOGREL BISULFATE 75 MG PO TABS
75.0000 mg | ORAL_TABLET | Freq: Every day | ORAL | Status: DC
  Administered 2015-03-05 – 2015-03-06 (×2): 75 mg via ORAL
  Filled 2015-03-05 (×2): qty 1

## 2015-03-05 MED ORDER — INSULIN ASPART 100 UNIT/ML ~~LOC~~ SOLN
0.0000 [IU] | Freq: Three times a day (TID) | SUBCUTANEOUS | Status: DC
  Administered 2015-03-05: 9 [IU] via SUBCUTANEOUS
  Administered 2015-03-06: 5 [IU] via SUBCUTANEOUS
  Administered 2015-03-06: 9 [IU] via SUBCUTANEOUS
  Filled 2015-03-05 (×2): qty 9
  Filled 2015-03-05: qty 5

## 2015-03-05 MED ORDER — ASPIRIN 81 MG PO CHEW
CHEWABLE_TABLET | ORAL | Status: AC
  Administered 2015-03-05: 324 mg via ORAL
  Filled 2015-03-05: qty 4

## 2015-03-05 MED ORDER — ASPIRIN 81 MG PO CHEW
324.0000 mg | CHEWABLE_TABLET | Freq: Once | ORAL | Status: AC
  Administered 2015-03-05: 324 mg via ORAL

## 2015-03-05 MED ORDER — METOPROLOL TARTRATE 25 MG PO TABS
25.0000 mg | ORAL_TABLET | Freq: Two times a day (BID) | ORAL | Status: DC
  Administered 2015-03-05 – 2015-03-06 (×2): 25 mg via ORAL
  Filled 2015-03-05 (×2): qty 1

## 2015-03-05 NOTE — ED Notes (Signed)
Developed chest pain about 45 min then nausea  Then developed left hand numbness about 30 mins ago

## 2015-03-05 NOTE — Progress Notes (Signed)
Martin Mullen is a 35 y.o. male patient admitted from ED awake, alert - oriented  X 4 - no acute distress noted.  VSS - Blood pressure 147/96, pulse 77, temperature 97.6 F (36.4 C), temperature source Oral, resp. rate 16, height 5\' 10"  (1.778 m), weight 110.496 kg (243 lb 9.6 oz), SpO2 98 %.    IV in place, occlusive dsg intact without redness.  Orientation to room, and floor completed with information packet given to patient/family.  Admission INP armband ID verified with patient/family, and in place.   SR up x 2, fall assessment complete, with patient and family able to verbalize understanding of risk associated with falls, and verbalized understanding to call nsg before up out of bed.  Call light within reach, patient able to voice, and demonstrate understanding.  Skin, clean-dry- intact without evidence of bruising, or skin tears.   No evidence of skin break down noted on exam.     Will cont to eval and treat per MD orders.  Rudean Haskell, RN 03/05/2015 5:42 PM

## 2015-03-05 NOTE — ED Provider Notes (Signed)
[facility] Emergency Department Provider Note    ____________________________________________  Time seen: 1445  I have reviewed the triage vital signs and the nursing notes.   HISTORY  Chief Complaint Chest Pain   History limited by: Not Limited   HPI Martin Mullen is a 35 y.o. male with history of MI s/p stent placement, who presents to the emergency department today because of chest pain. He states that it started roughly 6 hours ago. He describes it as being located in his left chest. It came when he was at rest. It is now gone. It gradually got better however it stayed for a number of hours. He did have some left arm numbness with this. He did not have any associated shortness of breath or diaphoresis. He did have some associated nausea which reminds him of his previous myocardial infarction. He denies any fevers. He states he last saw a cardiologist over a year ago.      Past Medical History  Diagnosis Date  . 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  . Diabetes mellitus without complication (HCC)   . Chicken pox   . Coronary artery disease     Inferior ST elevation myocardial infarction in July of 2014. Cardiac catheterization showed 95% distal RCA stenosis and 90% first diagonal stenosis with normal ejection fraction. He underwent PCI in bare-metal stent placement to the distal RCA.  Marland Kitchen Hypercholesterolemia   . Tobacco use     Patient Active Problem List   Diagnosis Date Noted  . Coronary artery disease   . Obesity (BMI 30-39.9) 07/09/2013  . DM type 2, uncontrolled, with neuropathy (HCC) 07/09/2013  . STEMI s/p RCA stent 08/2012 09/05/2012  . HTN (hypertension) 09/05/2012  . HLD (hyperlipidemia) 09/05/2012  . Nicotine dependence 09/05/2012  . Adjustment disorder with mixed anxiety and depressed mood 09/05/2012    Past Surgical History  Procedure Laterality Date  . Coronary angioplasty  with stent placement    . Left heart catheterization with coronary angiogram N/A 08/28/2012    Procedure: LEFT HEART CATHETERIZATION WITH CORONARY ANGIOGRAM;  Surgeon: Pamella Pert, MD;  Location: [facility] CATH LAB;  Service: Cardiovascular;  Laterality: N/A;    Current Outpatient Rx  Name  Route  Sig  Dispense  Refill  . aspirin 81 MG EC tablet   Oral   Take 1 tablet (81 mg total) by mouth daily.   30 tablet   4   . Canagliflozin 100 MG TABS   Oral   Take 1 tablet (100 mg total) by mouth daily.   30 tablet   2   . clopidogrel (PLAVIX) 75 MG tablet   Oral   Take 1 tablet (75 mg total) by mouth daily. MUST SCHEDULE ANNUAL PHYSICAL FOR MORE REFILLS   30 tablet   0   . cyclobenzaprine (FLEXERIL) 5 MG tablet   Oral   Take 1 tablet (5 mg total) by mouth 3 (three) times daily as needed for muscle spasms.   30 tablet   0   . glyBURIDE (DIABETA) 5 MG tablet   Oral   Take 1 tablet (5 mg total) by mouth 2 (two) times daily with a meal. MUST SCHEDULE ANNUAL PHYSICAL FOR MORE REFILLS   60 tablet   0   . metFORMIN (GLUCOPHAGE) 1000 MG tablet   Oral   Take 1 tablet (1,000 mg total) by mouth 2 (two) times daily with a meal. MUST  SCHEDULE ANNUAL PHYSICAL FOR MORE REFILLS   60 tablet   0   . metoprolol tartrate (LOPRESSOR) 25 MG tablet   Oral   Take 1 tablet (25 mg total) by mouth 2 (two) times daily.   60 tablet   5   . Multiple Vitamins-Minerals (MULTIVITAMIN PO)   Oral   Take 1 capsule by mouth daily.         . nitroGLYCERIN (NITROSTAT) 0.4 MG SL tablet   Sublingual   Place 1 tablet (0.4 mg total) under the tongue every 5 (five) minutes x 3 doses as needed for chest pain.   30 tablet   12   . pravastatin (PRAVACHOL) 40 MG tablet      2 tablets in the evening after meals   60 tablet   4   . traMADol (ULTRAM) 50 MG tablet   Oral   Take 1 tablet (50 mg total) by mouth every 8 (eight) hours as needed for severe pain.   30 tablet   0     Allergies Review of  patient's allergies indicates no known allergies.  Family History  Problem Relation Age of Onset  . Arthritis Mother     Rheumatoid  . Diabetes Mother   . Hyperlipidemia Mother   . Diabetes Father   . Cancer Maternal Grandfather     Prostate  . Parkinson's disease Maternal Grandfather     Social History Social History  Substance Use Topics  . Smoking status: Current Every Day Smoker -- 1.00 packs/day for 20 years    Types: Cigarettes  . Smokeless tobacco: None  . Alcohol Use: 0.0 oz/week    0 Standard drinks or equivalent per week     Comment: occasional    Review of Systems  Constitutional: Negative for fever. Cardiovascular: Positive for chest pain. Respiratory: Negative for shortness of breath. Gastrointestinal: Negative for abdominal pain, vomiting and diarrhea. Neurological: Positive for left arm numbness  10-point ROS otherwise negative.  ____________________________________________   PHYSICAL EXAM:  VITAL SIGNS: ED Triage Vitals  Enc Vitals Group     BP 03/05/15 1322 145/84 mmHg     Pulse Rate 03/05/15 1322 81     Resp 03/05/15 1322 20     Temp 03/05/15 1322 97.8 F (36.6 C)     Temp Source 03/05/15 1322 Oral     SpO2 03/05/15 1322 98 %     Weight 03/05/15 1322 260 lb (117.935 kg)     Height 03/05/15 1322 5\' 10"  (1.778 m)     Head Cir --      Peak Flow --      Pain Score 03/05/15 1323 3   Constitutional: Alert and oriented. Well appearing and in no distress. Eyes: Conjunctivae are normal. PERRL. Normal extraocular movements. ENT   Head: Normocephalic and atraumatic.   Nose: No congestion/rhinnorhea.   Mouth/Throat: Mucous membranes are moist.   Neck: No stridor. Hematological/Lymphatic/Immunilogical: No cervical lymphadenopathy. Cardiovascular: Normal rate, regular rhythm.  No murmurs, rubs, or gallops. Respiratory: Normal respiratory effort without tachypnea nor retractions. Breath sounds are clear and equal bilaterally. No  wheezes/rales/rhonchi. Gastrointestinal: Soft and nontender. No distention. There is no CVA tenderness. Genitourinary: Deferred Musculoskeletal: Normal range of motion in all extremities. No joint effusions.   Neurologic:  Normal speech and language. No gross focal neurologic deficits are appreciated.  Skin:  Skin is warm, dry and intact. No rash noted. Psychiatric: Mood and affect are normal. Speech and behavior are normal. Patient exhibits appropriate insight and  judgment.  ____________________________________________    LABS (pertinent positives/negatives)  Labs Reviewed  BASIC METABOLIC PANEL - Abnormal; Notable for the following:    Sodium 133 (*)    Chloride 98 (*)    Glucose, Bld 404 (*)    All other components within normal limits  CBC - Abnormal; Notable for the following:    WBC 12.5 (*)    Hemoglobin 18.2 (*)    HCT 53.0 (*)    All other components within normal limits  TROPONIN I     ____________________________________________   EKG  I, Phineas Semen, attending physician, personally viewed and interpreted this EKG  EKG Time: 1308 Rate: 82 Rhythm: NSR Axis: normal Intervals: qtc 422 QRS: narrow ST changes: no st elevation Impression: normal ekg ____________________________________________    RADIOLOGY  CXR IMPRESSION: No active cardiopulmonary disease.   ____________________________________________   PROCEDURES  Procedure(s) performed: None  Critical Care performed: No  ____________________________________________   INITIAL IMPRESSION / ASSESSMENT AND PLAN / ED COURSE  Pertinent labs & imaging results that were available during my care of the patient were reviewed by me and considered in my medical decision making (see chart for details).  35 year old male with history of myocardial infarction presents because of left-sided chest pain. Pain is no longer present at the time my exam. Initial troponin negative. Chest x-ray and EKG  without concerning findings. However I did consider the patient had a risk given history of PCI. Given the patient has not had any provocative testing within the past year will plan on admission to the hospitalist service.  ____________________________________________   FINAL CLINICAL IMPRESSION(S) / ED DIAGNOSES  Final diagnoses:  Chest pain, unspecified chest pain type     Phineas Semen, MD 03/05/15 1520

## 2015-03-05 NOTE — H&P (Signed)
[facility] [place] [facility]   PATIENT NAME: Martin Mullen    MR#:  [id number]  DATE OF BIRTH:  [birth year]  DATE OF ADMISSION:  03/05/2015  PRIMARY CARE PHYSICIAN: [name], NP   REQUESTING/REFERRING PHYSICIAN: Dr. Derrill Kay  CHIEF COMPLAINT:   Chief Complaint  Patient presents with  . Chest Pain    HISTORY OF PRESENT ILLNESS:  Martin Mullen  is a 35 y.o. male with a known history of coronary artery disease status post MI in 08/2012 with 95% occlusion of RCA at that time status post bare-metal stent, uncontrolled diabetes, hypertension, ongoing tobacco abuse presents with chest pain that started this morning. He reports that he was at work talking with coworkers when he developed acute onset left-sided chest pain accompanied by nausea. He then noted numbness in his left and then the right hand up to the wrists. No diaphoresis, vomiting, syncope or shortness of breath. Episode lasted 45 minutes to an hour. He describes a grabbing pain in the left chest which was intermittent coming and going every 2-3 minutes. Initial EKG shows no specific changes, troponin is negative. He is being admitted for ACS rule out  PAST MEDICAL HISTORY:   Past Medical History  Diagnosis Date  . 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  . Diabetes mellitus without complication (HCC)   . Chicken pox   . Coronary artery disease     Inferior ST elevation myocardial infarction in July of 2014. Cardiac catheterization showed 95% distal RCA stenosis and 90% first diagonal stenosis with normal ejection fraction. He underwent PCI in bare-metal stent placement to the distal RCA.  Marland Kitchen Hypercholesterolemia   . Tobacco use     PAST SURGICAL HISTORY:   Past Surgical History  Procedure Laterality Date  . Coronary angioplasty with stent placement    . Left heart catheterization with coronary angiogram N/A 08/28/2012    Procedure: LEFT  HEART CATHETERIZATION WITH CORONARY ANGIOGRAM;  Surgeon: Pamella Pert, MD;  Location: [facility] CATH LAB;  Service: Cardiovascular;  Laterality: N/A;    SOCIAL HISTORY:   Social History  Substance Use Topics  . Smoking status: Current Every Day Smoker -- 1.00 packs/day for 20 years    Types: Cigarettes  . Smokeless tobacco: Not on file  . Alcohol Use: 0.0 oz/week    0 Standard drinks or equivalent per week     Comment: occasional    FAMILY HISTORY:   Family History  Problem Relation Age of Onset  . Arthritis Mother     Rheumatoid  . Diabetes Mother   . Hyperlipidemia Mother   . Diabetes Father   . Cancer Maternal Grandfather     Prostate  . Parkinson's disease Maternal Grandfather     DRUG ALLERGIES:  No Known Allergies  REVIEW OF SYSTEMS:   Review of Systems  Constitutional: Negative for fever, chills, weight loss and malaise/fatigue.  HENT: Negative for congestion and hearing loss.   Eyes: Negative for blurred vision and pain.  Respiratory: Negative for cough, hemoptysis, sputum production, shortness of breath and stridor.   Cardiovascular: Positive for chest pain. Negative for palpitations, orthopnea and leg swelling.  Gastrointestinal: Positive for nausea. Negative for vomiting, abdominal pain, diarrhea, constipation and blood in stool.  Genitourinary: Negative for dysuria and frequency.  Musculoskeletal: Negative for myalgias, back pain, joint pain and neck pain.  Skin: Negative for rash.  Neurological: Negative for focal weakness, loss of  consciousness and headaches.  Endo/Heme/Allergies: Does not bruise/bleed easily.  Psychiatric/Behavioral: Negative for depression and hallucinations. The patient is not nervous/anxious.     MEDICATIONS AT HOME:   Prior to Admission medications   Medication Sig Start Date End Date Taking? Authorizing Provider  aspirin 81 MG EC tablet Take 1 tablet (81 mg total) by mouth daily. 09/05/12   Ripudeep Jenna Luo, MD  Canagliflozin 100  MG TABS Take 1 tablet (100 mg total) by mouth daily. 11/21/13   [name], NP  clopidogrel (PLAVIX) 75 MG tablet Take 1 tablet (75 mg total) by mouth daily. MUST SCHEDULE ANNUAL PHYSICAL FOR MORE REFILLS 02/18/15   [name], NP  cyclobenzaprine (FLEXERIL) 5 MG tablet Take 1 tablet (5 mg total) by mouth 3 (three) times daily as needed for muscle spasms. 05/19/14   Doreene Nest, NP  glyBURIDE (DIABETA) 5 MG tablet Take 1 tablet (5 mg total) by mouth 2 (two) times daily with a meal. MUST SCHEDULE ANNUAL PHYSICAL FOR MORE REFILLS 02/18/15   [name], NP  metFORMIN (GLUCOPHAGE) 1000 MG tablet Take 1 tablet (1,000 mg total) by mouth 2 (two) times daily with a meal. MUST SCHEDULE ANNUAL PHYSICAL FOR MORE REFILLS 02/18/15   [name], NP  metoprolol tartrate (LOPRESSOR) 25 MG tablet Take 1 tablet (25 mg total) by mouth 2 (two) times daily. 04/01/14   [name], NP  Multiple Vitamins-Minerals (MULTIVITAMIN PO) Take 1 capsule by mouth daily.    Historical Provider, MD  nitroGLYCERIN (NITROSTAT) 0.4 MG SL tablet Place 1 tablet (0.4 mg total) under the tongue every 5 (five) minutes x 3 doses as needed for chest pain. 11/21/13   [name], NP  pravastatin (PRAVACHOL) 40 MG tablet 2 tablets in the evening after meals 07/09/13   [name], NP  traMADol (ULTRAM) 50 MG tablet Take 1 tablet (50 mg total) by mouth every 8 (eight) hours as needed for severe pain. 05/19/14   Doreene Nest, NP      VITAL SIGNS:  Blood pressure 145/84, pulse 81, temperature 97.8 F (36.6 C), temperature source Oral, resp. rate 20, height  (1.778 m), weight 117.935 kg (260 lb), SpO2 98 %.  PHYSICAL EXAMINATION:  GENERAL:  35 y.o.-year-old patient lying in the bed with no acute distress.  EYES: Pupils equal, round, reactive to light and accommodation. No scleral icterus. Extraocular muscles intact.  HEENT: Head atraumatic, normocephalic. Oropharynx and nasopharynx clear. Oral mucosa pink and moist,  leukoplakia, poor dentition NECK:  Supple, no jugular venous distention. No thyroid enlargement, no tenderness.  LUNGS: Normal breath sounds bilaterally, no wheezing, rales,rhonchi or crepitation. No use of accessory muscles of respiration.  CARDIOVASCULAR: S1, S2 normal. No murmurs, rubs, or gallops.  ABDOMEN: Soft, nontender, nondistended. Bowel sounds present. No organomegaly or mass. No guarding no rebound EXTREMITIES: No pedal edema, cyanosis, or clubbing. Also is 2+ NEUROLOGIC: Cranial nerves II through XII are intact. Muscle strength 5/5 in all extremities. Sensation intact. Gait not checked.  PSYCHIATRIC: The patient is alert and oriented x 3.  SKIN: No obvious rash, lesion, or ulcer.   LABORATORY PANEL:   CBC  Recent Labs Lab 03/05/15 1329  WBC 12.5*  HGB 18.2*  HCT 53.0*  PLT 197   ------------------------------------------------------------------------------------------------------------------  Chemistries   Recent Labs Lab 03/05/15 1329  NA 133*  K 4.4  CL 98*  CO2 26  GLUCOSE 404*  BUN 10  CREATININE 0.78  CALCIUM 9.2   ------------------------------------------------------------------------------------------------------------------  Cardiac Enzymes  Recent Labs Lab 03/05/15 1329  TROPONINI <0.03   ------------------------------------------------------------------------------------------------------------------  RADIOLOGY:  Dg Chest 2 View  03/05/2015  CLINICAL DATA:  Chest pain radiating to the left arm.  Smoker. EXAM: CHEST  2 VIEW COMPARISON:  05/19/2014 FINDINGS: The cardiomediastinal silhouette is within normal limits. An azygos fissure is again seen in the right upper lobe. The lungs are well inflated and clear. No pleural effusion or pneumothorax is identified. No acute osseous abnormality is seen. IMPRESSION: No active cardiopulmonary disease. Electronically Signed   By: Sebastian Ache M.D.   On: 03/05/2015 13:45    EKG:   Orders placed or  performed during the hospital encounter of 03/05/15  . ED EKG within 10 minutes  . ED EKG within 10 minutes    IMPRESSION AND PLAN:   1. Chest pain in patient with history of coronary artery disease status post MI, multiple risk factors: - Initial troponin negative, continue to cycle - Monitor on telemetry - Check lipids and A1c - Check 2-D echocardiogram - Continue aspirin, Plavix, statin (escalate to high intensity), beta blocker - Consult cardiology as he has not followed up as an outpatient  2. Diabetes mellitus type 2 - Check A1c, start sliding scale, hold oral hypoglycemic agents, carbohydrate modified diet  3. Hyperlipidemia - Check fasting lipid panel, continue statin  4. Ongoing tobacco abuse - Smoking cessation counseling provided for 3-5 minutes. Nicotine patch  5. Access to medication and health care: Care management consultation. Patient states he cannot afford to follow-up with physicians  6. Prophylaxis: Heparin for DVT prophylaxis   All the records are reviewed and case discussed with ED provider. Management plans discussed with the patient, wife and mother-in-law and they are in agreement.  CODE STATUS: Full  TOTAL TIME TAKING CARE OF THIS PATIENT: 55 minutes.  Greater than 50% of time spent in coordination of care and counseling.  Elby Showers M.D on 03/05/2015 at 3:44 PM  Between 7am to 6pm - Pager - [phone]  After 6pm go to [email or web address] - password [email or web address] [facility]  [facility]  Office  [phone]  CC: Primary care physician; [name], NP

## 2015-03-05 NOTE — Progress Notes (Signed)
Cardiac enzymes negative 2 Patient with history of coronary artery disease Nurses discussed options with patient, Exercise Myoview we scheduled for tomorrow morning, 03/06/2015 to rule out ischemia Dr. Kirke Corin to perform full consultation tomorrow morning

## 2015-03-05 NOTE — ED Notes (Signed)
Pt states hx of MI and stents, state today CP left sided with nausea, pt denies any SOB, states he did not take nitro, awake and alert in no acute distress

## 2015-03-06 ENCOUNTER — Observation Stay (HOSPITAL_BASED_OUTPATIENT_CLINIC_OR_DEPARTMENT_OTHER): Payer: BLUE CROSS/BLUE SHIELD

## 2015-03-06 ENCOUNTER — Observation Stay (HOSPITAL_BASED_OUTPATIENT_CLINIC_OR_DEPARTMENT_OTHER)
Admit: 2015-03-06 | Discharge: 2015-03-06 | Disposition: A | Discharge status: Home / Self Care | Payer: BLUE CROSS/BLUE SHIELD | Attending: Cardiovascular Disease | Admitting: Cardiovascular Disease

## 2015-03-06 DIAGNOSIS — I209 Angina pectoris, unspecified: Secondary | ICD-10-CM

## 2015-03-06 DIAGNOSIS — R079 Chest pain, unspecified: Secondary | ICD-10-CM | POA: Diagnosis not present

## 2015-03-06 LAB — NM MYOCAR MULTI W/SPECT W/WALL MOTION / EF
CHL CUP NUCLEAR SRS: 0
CHL CUP NUCLEAR SSS: 0
CHL CUP RESTING HR STRESS: 74 {beats}/min
LV dias vol: 62 mL
LV sys vol: 26 mL
NUC STRESS TID: 0.73
Peak HR: 101 {beats}/min
Percent HR: 54 %
SDS: 0

## 2015-03-06 LAB — URINE DRUG SCREEN, QUALITATIVE (ARMC ONLY)
AMPHETAMINES, UR SCREEN: NOT DETECTED
BARBITURATES, UR SCREEN: NOT DETECTED
BENZODIAZEPINE, UR SCRN: NOT DETECTED
COCAINE METABOLITE, UR ~~LOC~~: POSITIVE — AB
Cannabinoid 50 Ng, Ur ~~LOC~~: NOT DETECTED
MDMA (Ecstasy)Ur Screen: NOT DETECTED
METHADONE SCREEN, URINE: NOT DETECTED
OPIATE, UR SCREEN: NOT DETECTED
Phencyclidine (PCP) Ur S: NOT DETECTED
Tricyclic, Ur Screen: NOT DETECTED

## 2015-03-06 LAB — CBC
HCT: 51.6 % (ref 40.0–52.0)
HEMOGLOBIN: 17.2 g/dL (ref 13.0–18.0)
MCH: 31.7 pg (ref 26.0–34.0)
MCHC: 33.4 g/dL (ref 32.0–36.0)
MCV: 94.9 fL (ref 80.0–100.0)
Platelets: 182 10*3/uL (ref 150–440)
RBC: 5.43 MIL/uL (ref 4.40–5.90)
RDW: 12.9 % (ref 11.5–14.5)
WBC: 11.9 10*3/uL — ABNORMAL HIGH (ref 3.8–10.6)

## 2015-03-06 LAB — LIPID PANEL
CHOLESTEROL: 152 mg/dL (ref 0–200)
HDL: 21 mg/dL — ABNORMAL LOW (ref >40)
LDL Cholesterol: UNDETERMINED mg/dL (ref 0–99)
Total CHOL/HDL Ratio: 7.2 RATIO
Triglycerides: 466 mg/dL — ABNORMAL HIGH (ref <150)
VLDL: UNDETERMINED mg/dL (ref 0–40)

## 2015-03-06 LAB — BASIC METABOLIC PANEL
Anion gap: 11 (ref 5–15)
BUN: 11 mg/dL (ref 6–20)
CO2: 22 mmol/L (ref 22–32)
Calcium: 8.4 mg/dL — ABNORMAL LOW (ref 8.9–10.3)
Chloride: 100 mmol/L — ABNORMAL LOW (ref 101–111)
Creatinine, Ser: 0.76 mg/dL (ref 0.61–1.24)
GFR calc Af Amer: >60 mL/min (ref >60)
GFR calc non Af Amer: >60 mL/min (ref >60)
GLUCOSE: 342 mg/dL — AB (ref 65–99)
POTASSIUM: 3.5 mmol/L (ref 3.5–5.1)
Sodium: 133 mmol/L — ABNORMAL LOW (ref 135–145)

## 2015-03-06 LAB — GLUCOSE, CAPILLARY
Glucose-Capillary: 366 mg/dL — ABNORMAL HIGH (ref 65–99)
Glucose-Capillary: 275 mg/dL — ABNORMAL HIGH (ref 65–99)

## 2015-03-06 LAB — TROPONIN I

## 2015-03-06 LAB — HEMOGLOBIN A1C: Hgb A1c MFr Bld: 11.4 % — ABNORMAL HIGH (ref 4.0–6.0)

## 2015-03-06 MED ORDER — TECHNETIUM TC 99M SESTAMIBI - CARDIOLITE
28.5100 | Freq: Once | INTRAVENOUS | Status: AC | PRN
  Administered 2015-03-06: 28.51 via INTRAVENOUS

## 2015-03-06 MED ORDER — TECHNETIUM TC 99M SESTAMIBI - CARDIOLITE
10.0000 | Freq: Once | INTRAVENOUS | Status: AC | PRN
  Administered 2015-03-06: 09:00:00 13.93 via INTRAVENOUS

## 2015-03-06 NOTE — Discharge Summary (Signed)
[facility] - [place] at [facility]   PATIENT NAME: Martin Mullen    MR#:  [id number]  DATE OF BIRTH:  1980-04-14  DATE OF ADMISSION:  03/05/2015 ADMITTING PHYSICIAN: Gale Journey, MD  DATE OF DISCHARGE: 03/06/2015  3:33 PM  PRIMARY CARE PHYSICIAN: [name], NP    ADMISSION DIAGNOSIS:  Chest pain, unspecified chest pain type [R07.9]  DISCHARGE DIAGNOSIS:  Active Problems:   Chest pain   SECONDARY DIAGNOSIS:   Past Medical History  Diagnosis Date  . 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  . Diabetes mellitus without complication (HCC)   . Chicken pox   . Coronary artery disease     Inferior ST elevation myocardial infarction in July of 2014. Cardiac catheterization showed 95% distal RCA stenosis and 90% first diagonal stenosis with normal ejection fraction. He underwent PCI in bare-metal stent placement to the distal RCA.  Marland Kitchen Hypercholesterolemia   . Tobacco use     HOSPITAL COURSE:   35 year old male with past medical history of coronary artery disease status post MI, hyperlipidemia, tobacco abuse, diabetes type 2 without complication who presented to the hospital with chest pain.  #1 chest pain-given the patient's cardiac history is significant risk factors he was observed overnight on telemetry and had 3 sets of cardiac markers checked which were negative.  -patient was seen by cardiology and also underwent a nuclear medicine stress test which is negative for any acute wall motion abnormalities or any evidence of reversible ischemia. -Patient is currently chest pain-free and hemodynamically stable and therefore being discharged on his home meds including aspirin, Plavix, beta blocker, statin. -She will follow-up with cardiology as an outpatient.   #2 diabetes type 2 without complication-patient was maintained on sliding scale insulin but will resume his metformin upon discharge.  #3  hyperlipidemia-patient will resume his Pravachol.    DISCHARGE CONDITIONS:   Stable   CONSULTS OBTAINED:  Treatment Team:  Antonieta Iba, MD  DRUG ALLERGIES:  No Known Allergies  DISCHARGE MEDICATIONS:   Discharge Medication List as of 03/06/2015  3:15 PM    CONTINUE these medications which have NOT CHANGED   Details  aspirin 81 MG EC tablet Take 1 tablet (81 mg total) by mouth daily., Starting 09/05/2012, Until Discontinued, OTC    clopidogrel (PLAVIX) 75 MG tablet Take 1 tablet (75 mg total) by mouth daily. MUST SCHEDULE ANNUAL PHYSICAL FOR MORE REFILLS, Starting 02/18/2015, Until Discontinued, Normal    glyBURIDE (DIABETA) 5 MG tablet Take 1 tablet (5 mg total) by mouth 2 (two) times daily with a meal. MUST SCHEDULE ANNUAL PHYSICAL FOR MORE REFILLS, Starting 02/18/2015, Until Discontinued, Normal    metFORMIN (GLUCOPHAGE) 1000 MG tablet Take 1 tablet (1,000 mg total) by mouth 2 (two) times daily with a meal. MUST SCHEDULE ANNUAL PHYSICAL FOR MORE REFILLS, Starting 02/18/2015, Until Discontinued, Normal    metoprolol tartrate (LOPRESSOR) 25 MG tablet Take 1 tablet (25 mg total) by mouth 2 (two) times daily., Starting 04/01/2014, Until Discontinued, Normal    Multiple Vitamins-Minerals (MULTIVITAMIN PO) Take 1 capsule by mouth daily., Until Discontinued, Historical Med    nitroGLYCERIN (NITROSTAT) 0.4 MG SL tablet Place 1 tablet (0.4 mg total) under the tongue every 5 (five) minutes x 3 doses as needed for chest pain., Starting 11/21/2013, Until Discontinued, Normal    Omega-3 Fatty Acids (FISH OIL) 1200 MG CAPS Take 1,200 mg by mouth 3 (three) times daily., Until  Discontinued, Historical Med    pravastatin (PRAVACHOL) 40 MG tablet 2 tablets in the evening after meals, Normal         DISCHARGE INSTRUCTIONS:   DIET:  Cardiac diet and Diabetic diet  DISCHARGE CONDITION:  Stable  ACTIVITY:  Activity as tolerated  OXYGEN:  Home Oxygen: No.   Oxygen Delivery: room  air  DISCHARGE LOCATION:  home   If you experience worsening of your admission symptoms, develop shortness of breath, life threatening emergency, suicidal or homicidal thoughts you must seek medical attention immediately by calling 911 or calling your MD immediately  if symptoms less severe.  You Must read complete instructions/literature along with all the possible adverse reactions/side effects for all the Medicines you take and that have been prescribed to you. Take any new Medicines after you have completely understood and accpet all the possible adverse reactions/side effects.   Please note  You were cared for by a hospitalist during your hospital stay. If you have any questions about your discharge medications or the care you received while you were in the hospital after you are discharged, you can call the unit and asked to speak with the hospitalist on call if the hospitalist that took care of you is not available. Once you are discharged, your primary care physician will handle any further medical issues. Please note that NO REFILLS for any discharge medications will be authorized once you are discharged, as it is imperative that you return to your primary care physician (or establish a relationship with a primary care physician if you do not have one) for your aftercare needs so that they can reassess your need for medications and monitor your lab values.     Today   Re: Chest pain free. No shortness of breath, nausea, vomiting.  VITAL SIGNS:  Blood pressure 121/86, pulse 75, temperature 97.7 F (36.5 C), temperature source Oral, resp. rate 20, height 5\' 10"  (1.778 m), weight 110.496 kg (243 lb 9.6 oz), SpO2 97 %.  I/O:   Intake/Output Summary (Last 24 hours) at 03/06/15 1616 Last data filed at 03/06/15 1130  Gross per 24 hour  Intake      0 ml  Output   1350 ml  Net  -1350 ml    PHYSICAL EXAMINATION:  GENERAL:  35 y.o.-year-old patient lying in the bed with no acute  distress.  EYES: Pupils equal, round, reactive to light and accommodation. No scleral icterus. Extraocular muscles intact.  HEENT: Head atraumatic, normocephalic. Oropharynx and nasopharynx clear.  NECK:  Supple, no jugular venous distention. No thyroid enlargement, no tenderness.  LUNGS: Normal breath sounds bilaterally, no wheezing, rales,rhonchi. No use of accessory muscles of respiration.  CARDIOVASCULAR: S1, S2 normal. No murmurs, rubs, or gallops.  ABDOMEN: Soft, non-tender, non-distended. Bowel sounds present. No organomegaly or mass.  EXTREMITIES: No pedal edema, cyanosis, or clubbing.  NEUROLOGIC: Cranial nerves II through XII are intact. No focal motor or sensory defecits b/l.  PSYCHIATRIC: The patient is alert and oriented x 3. Good affect.  SKIN: No obvious rash, lesion, or ulcer.   DATA REVIEW:   CBC  Recent Labs Lab 03/06/15 0641  WBC 11.9*  HGB 17.2  HCT 51.6  PLT 182    Chemistries   Recent Labs Lab 03/06/15 0641  NA 133*  K 3.5  CL 100*  CO2 22  GLUCOSE 342*  BUN 11  CREATININE 0.76  CALCIUM 8.4*    Cardiac Enzymes  Recent Labs Lab 03/06/15 0153  TROPONINI <0.03  Microbiology Results  Results for orders placed or performed during the hospital encounter of 08/28/12  MRSA PCR Screening     Status: None   Collection Time: 08/28/12 10:39 AM  Result Value Ref Range Status   MRSA by PCR NEGATIVE NEGATIVE Final    Comment:        The GeneXpert MRSA Assay (FDA approved for NASAL specimens only), is one component of a comprehensive MRSA colonization surveillance program. It is not intended to diagnose MRSA infection nor to guide or monitor treatment for MRSA infections.    RADIOLOGY:  Dg Chest 2 View  03/05/2015  CLINICAL DATA:  Chest pain radiating to the left arm.  Smoker. EXAM: CHEST  2 VIEW COMPARISON:  05/19/2014 FINDINGS: The cardiomediastinal silhouette is within normal limits. An azygos fissure is again seen in the right upper  lobe. The lungs are well inflated and clear. No pleural effusion or pneumothorax is identified. No acute osseous abnormality is seen. IMPRESSION: No active cardiopulmonary disease. Electronically Signed   By: Sebastian Ache M.D.   On: 03/05/2015 13:45      Management plans discussed with the patient, family and they are in agreement.  CODE STATUS:     Code Status Orders        Start     Ordered   03/05/15 1617  Full code   Continuous     03/05/15 1616    Code Status History    Date Active Date Inactive Code Status Order ID Comments User Context   08/28/2012 10:38 AM 08/30/2012  2:29 PM Full Code [id number]  Pamella Pert, MD Inpatient      TOTAL TIME TAKING CARE OF THIS PATIENT: 40 minutes.    Houston Siren M.D on 03/06/2015 at 4:16 PM  Between 7am to 6pm - Pager - [phone]  After 6pm go to [email or web address] - password [email or web address] [facility]  [facility]  Office  [phone]  CC: Primary care physician; [name], NP

## 2015-03-06 NOTE — Progress Notes (Signed)
Inpatient Diabetes Program Recommendations  AACE/ADA: New Consensus Statement on Inpatient Glycemic Control (2015)  Target Ranges:  Prepandial:   less than 140 mg/dL      Peak postprandial:   less than 180 mg/dL (1-2 hours)      Critically ill patients:  140 - 180 mg/dL   Review of Glycemic Control  Results for Martin Mullen, Martin Mullen (MRN [id number]) as of 03/06/2015 12:35  Ref. Range 03/05/2015 16:46 03/05/2015 20:39 03/06/2015 07:33 03/06/2015 11:20  Glucose-Capillary Latest Ref Range: 65-99 mg/dL 503 (H) 546 (H) 568 (H) 275 (H)    Diabetes history: Type 2 Outpatient Diabetes medications: Glyburide 5mg  bid, Metformin 1000mg  bid (only takes am meds) Current orders for Inpatient glycemic control: Novolog 0-9 units tid and hs  Inpatient Diabetes Program Recommendations: Patient tells me he has no insurance and MD appointments cost $80.00 therefore he does not go often.  Tells me he has been to [facility] in [place] but has not returned (he lives in [place]).  Does not check blood sugars but has 2 meters, no strips.  Does not want Living Well with Diabetes book or dietitian consult.  Encouraged them to follow up with [facility] and; - ask for extended release Glucophage and Glyburide - get a meter and testing strips from [facility] or [facility] Reli[employer]on brand so strips are affordable  Consider ordering NPH insulin while in hospital- start 12 units bid am and pm (0.1unit/kg)  I have instructed he and his wife on the plate method of eating (and listed carbohydrates) and encouraged him to eat 3 meals and a bedtime snack.  Susette Racer, RN, BA, MHA, CDE Diabetes Coordinator Inpatient Diabetes Program  [phone] (Team Pager) [phone] [facility] Office) 03/06/2015 12:43 PM

## 2015-03-06 NOTE — Progress Notes (Signed)
Patient d/c'd home. Education provided, no questions at this time. Patient to be picked up by wife. Telemetry removed. Ethyl Vila R Mansfield  

## 2015-03-06 NOTE — Progress Notes (Signed)
*  PRELIMINARY RESULTS* Echocardiogram 2D Echocardiogram has been performed.  Georgann Housekeeper Hege 03/06/2015, 2:18 PM

## 2015-03-06 NOTE — Consult Note (Signed)
Cardiology Consultation Note  Patient ID: Martin Mullen, MRN: [id number], DOB/AGE: May 24, 1980 35 y.o. Admit date: 03/05/2015   Date of Consult: 03/06/2015 Primary Physician: [name], NP Primary Cardiologist: Dr. Kirke Corin, MD  Chief Complaint: Chest pain Reason for Consult: Unstable angina   HPI: 35 y.o. male with h/o CAD s/p inferior ST elevation MI s/p PCI/BMS on 08/28/2012 with residual 20% stenosis of the LCx and 90% D1 stenosis, DM2 diagnosed at the time of his MI, HLD, ongoing tobacco abuse, and remote history of marijuana and cocaine abuse who presented to [facility] on 1/19 with unstable angina.   He was last in the cardiology office on 07/19/2013 to establish care for the above MI. At that time he was noting substernal chest pain with over exertion. He underwent a treadmill stress test in the office that was reportedly normal. He has not undergone cardiac cath since the above procedure. He has continued to have intermittent chest pain about once per week, sometimes with exertional and sometimes at rest. He does not take any medication for this, with symptoms resolving on their own. He continues to take his medications as directed. He denies any premature family history of coronary disease.   On 1/19 he was at rest when he developed a "different" substernal chest pain that was intermittent, lasting 15 to 20 minutes. Pain did not feel like his prior MI. Pain was associated with nausea and left arm numbness. No diaphoresis, palpitations, or presyncope. He did nto take any medications prior to his arrival. Upon the patient's arrival to [facility] they were found to have negative cardiac enzymes. ECG was non acute, CXR showed no acute process. WBC 11.9. K+ 3.5. A1C 11.4. Triglycerides were 466. He is currently without chest pain.    Past Medical History  Diagnosis Date  . 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  . Diabetes mellitus without complication  (HCC)   . Chicken pox   . Coronary artery disease     Inferior ST elevation myocardial infarction in July of 2014. Cardiac catheterization showed 95% distal RCA stenosis and 90% first diagonal stenosis with normal ejection fraction. He underwent PCI in bare-metal stent placement to the distal RCA.  Marland Kitchen Hypercholesterolemia   . Tobacco use       Most Recent Cardiac Studies: Cardiac cath 08/28/2012: Hemodynamic data: Left ventricular pressure was 110/11 with LVEDP of 21 mm mercury. Aortic pressure was 111/79 with a mean of 95 mm mercury. There was no pressure gradient across the aortic valve.   Left ventricle: Performed in the RAO projection revealed LVEF of 55%. There was No MR. Mild basal inferior hypokinesis.  Right coronary artery: The vessel is smooth, Dominant. 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 It gives origin to large PDA and PL branch.  Left main coronary artery is large and normal.  Circumflex coronary artery: A large vessel, smooth with a midsegment diffuse 20% stenosis. Mild ectasia noted in the proximal to midsegment.  LAD: LAD gives origin to a large diagonal-1 which has a mid 90% stenosis, which appears to be ulcerated, it's a 2.25-2.5 mm vessel.Marland Kitchen LAD has mild diffuse luminal irregularities. Midsegment of the LAD shows a diffuse 30-40% stenosis. Distal LAD shows diffuse 20% stenosis.  Ramus intermediate: Is a moderate to large caliber vessel, smooth and normal.  Impression: Acute inferior wall MI with culprit vessel being distal RCA.  Interventional data: Successful PTCA  and stenting of the distal RCA with implantation of a Non-DES 3.5 x 12 mm Veri-flex stent. Will need Dual antiplatelet therapy with Plavix and ASA 81 mg for at least one year.     Surgical History:  Past Surgical History  Procedure Laterality Date  . Coronary angioplasty with stent placement    . Left heart catheterization with coronary angiogram N/A  08/28/2012    Procedure: LEFT HEART CATHETERIZATION WITH CORONARY ANGIOGRAM;  Surgeon: Pamella Pert, MD;  Location: [facility] CATH LAB;  Service: Cardiovascular;  Laterality: N/A;     Home Meds: Prior to Admission medications   Medication Sig Start Date End Date Taking? Authorizing Provider  aspirin 81 MG EC tablet Take 1 tablet (81 mg total) by mouth daily. 09/05/12  Yes Ripudeep Jenna Luo, MD  clopidogrel (PLAVIX) 75 MG tablet Take 1 tablet (75 mg total) by mouth daily. MUST SCHEDULE ANNUAL PHYSICAL FOR MORE REFILLS 02/18/15  Yes [name], NP  glyBURIDE (DIABETA) 5 MG tablet Take 1 tablet (5 mg total) by mouth 2 (two) times daily with a meal. MUST SCHEDULE ANNUAL PHYSICAL FOR MORE REFILLS 02/18/15  Yes [name], NP  metFORMIN (GLUCOPHAGE) 1000 MG tablet Take 1 tablet (1,000 mg total) by mouth 2 (two) times daily with a meal. MUST SCHEDULE ANNUAL PHYSICAL FOR MORE REFILLS 02/18/15  Yes [name], NP  metoprolol tartrate (LOPRESSOR) 25 MG tablet Take 1 tablet (25 mg total) by mouth 2 (two) times daily. 04/01/14  Yes [name], NP  Multiple Vitamins-Minerals (MULTIVITAMIN PO) Take 1 capsule by mouth daily.   Yes Historical Provider, MD  nitroGLYCERIN (NITROSTAT) 0.4 MG SL tablet Place 1 tablet (0.4 mg total) under the tongue every 5 (five) minutes x 3 doses as needed for chest pain. 11/21/13  Yes [name], NP  Omega-3 Fatty Acids (FISH OIL) 1200 MG CAPS Take 1,200 mg by mouth 3 (three) times daily.   Yes Historical Provider, MD  pravastatin (PRAVACHOL) 40 MG tablet 2 tablets in the evening after meals 07/09/13  Yes [name], NP    Inpatient Medications:  . aspirin EC  81 mg Oral Daily  . clopidogrel  75 mg Oral Daily  . heparin  5,000 Units Subcutaneous 3 times per day  . Influenza vac split quadrivalent PF  0.5 mL Intramuscular Tomorrow-1000  . insulin aspart  0-9 Units Subcutaneous TID AC & HS  . metoprolol tartrate  25 mg Oral BID  . nicotine  21 mg Transdermal Daily  .  rosuvastatin  20 mg Oral q1800      Allergies: No Known Allergies  Social History   Social History  . Marital Status: Married    Spouse Name: N/A  . Number of Children: N/A  . Years of Education: N/A   Occupational History  . Not on file.   Social History Main Topics  . Smoking status: Current Every Day Smoker -- 1.00 packs/day for 20 years    Types: Cigarettes  . Smokeless tobacco: Not on file  . Alcohol Use: 0.0 oz/week    0 Standard drinks or equivalent per week     Comment: occasional  . Drug Use: Yes    Special: Cocaine  . Sexual Activity: Yes    Birth Control/ Protection: None   Other Topics Concern  . Not on file   Social History Narrative     Family History  Problem Relation Age of Onset  . Arthritis Mother  Rheumatoid  . Diabetes Mother   . Hyperlipidemia Mother   . Diabetes Father   . Cancer Maternal Grandfather     Prostate  . Parkinson's disease Maternal Grandfather      Review of Systems: Review of Systems  Constitutional: Positive for malaise/fatigue. Negative for fever, chills, weight loss and diaphoresis.  HENT: Negative for congestion.   Eyes: Negative for discharge and redness.  Respiratory: Negative for cough, hemoptysis, sputum production, shortness of breath and wheezing.   Cardiovascular: Positive for chest pain. Negative for palpitations, orthopnea, claudication, leg swelling and PND.  Gastrointestinal: Positive for nausea. Negative for heartburn, vomiting and abdominal pain.  Musculoskeletal: Negative for falls.  Skin: Negative for rash.  Neurological: Negative for dizziness, tingling, tremors, sensory change, speech change, focal weakness, loss of consciousness and weakness.  Endo/Heme/Allergies: Does not bruise/bleed easily.  Psychiatric/Behavioral: Positive for substance abuse. The patient is not nervous/anxious.   All other systems reviewed and are negative.    Labs:  Recent Labs  03/05/15 1329 03/05/15 1619  03/05/15 2125 03/06/15 0153  TROPONINI <0.03 <0.03 <0.03 <0.03   Lab Results  Component Value Date   WBC 11.9* 03/06/2015   HGB 17.2 03/06/2015   HCT 51.6 03/06/2015   MCV 94.9 03/06/2015   PLT 182 03/06/2015    Recent Labs Lab 03/06/15 0641  NA 133*  K 3.5  CL 100*  CO2 22  BUN 11  CREATININE 0.76  CALCIUM 8.4*  GLUCOSE 342*   Lab Results  Component Value Date   CHOL 152 03/06/2015   HDL 21* 03/06/2015   LDLCALC UNABLE TO CALCULATE IF TRIGLYCERIDE OVER 400 mg/dL 16/11/9602   TRIG 540* 03/06/2015   No results found for: DDIMER  Radiology/Studies:  Dg Chest 2 View  03/05/2015  CLINICAL DATA:  Chest pain radiating to the left arm.  Smoker. EXAM: CHEST  2 VIEW COMPARISON:  05/19/2014 FINDINGS: The cardiomediastinal silhouette is within normal limits. An azygos fissure is again seen in the right upper lobe. The lungs are well inflated and clear. No pleural effusion or pneumothorax is identified. No acute osseous abnormality is seen. IMPRESSION: No active cardiopulmonary disease. Electronically Signed   By: Sebastian Ache M.D.   On: 03/05/2015 13:45    EKG: NSR, 82 bpm, no significant st/t changes  Weights: Filed Weights   03/05/15 1322 03/05/15 1618  Weight: 260 lb (117.935 kg) 243 lb 9.6 oz (110.496 kg)     Physical Exam: Blood pressure 123/88, pulse 71, temperature 97.7 F (36.5 C), temperature source Oral, resp. rate 22, height  (1.778 m), weight 243 lb 9.6 oz (110.496 kg), SpO2 96 %. Body mass index is 34.95 kg/(m^2). General: Well developed, well nourished, in no acute distress. Head: Normocephalic, atraumatic, sclera non-icteric, no xanthomas, nares are without discharge.  Neck: Negative for carotid bruits. JVD not elevated. Lungs: Clear bilaterally to auscultation without wheezes, rales, or rhonchi. Breathing is unlabored. Heart: RRR with S1 S2. No murmurs, rubs, or gallops appreciated. Abdomen: Soft, non-tender, non-distended with normoactive bowel  sounds. No hepatomegaly. No rebound/guarding. No obvious abdominal masses. Msk:  Strength and tone appear normal for age. Extremities: No clubbing or cyanosis. No edema.  Distal pedal pulses are 2+ and equal bilaterally. Neuro: Alert and oriented X 3. No facial asymmetry. No focal deficit. Moves all extremities spontaneously. Psych:  Responds to questions appropriately with a normal affect.    Assessment and Plan:   1. Unstable angina/CAD s/p PCI/BMS to RCA as above: -He is for nuclear  stress test this morning to evaluate for high risk ischemia -Echo is pending -Continue Plavix and aspirin -Lopressor 25 mg bid -Pravastatin, financial issues limiting Lipitor or Crestor likely at discharge   2. DM2: -Poorly controlled -Per IM  3. HTN: -Controlled -Continue current medications as above  4. HLD: -Statin as above  5. Tobacco abuse with history of polysubstance abuse: -Check urine drug screen -Cessation of tobacco advise   Signed, Eula Listen, PA-C Pager: [phone] 03/06/2015, 8:13 AM

## 2015-03-31 ENCOUNTER — Other Ambulatory Visit: Payer: Self-pay | Admitting: Internal Medicine

## 2015-04-12 ENCOUNTER — Emergency Department
Admission: EM | Admit: 2015-04-12 | Discharge: 2015-04-12 | Disposition: A | Discharge status: Home / Self Care | Payer: BLUE CROSS/BLUE SHIELD | Attending: Emergency Medicine | Admitting: Emergency Medicine

## 2015-04-12 DIAGNOSIS — I10 Essential (primary) hypertension: Secondary | ICD-10-CM | POA: Insufficient documentation

## 2015-04-12 DIAGNOSIS — Z7984 Long term (current) use of oral hypoglycemic drugs: Secondary | ICD-10-CM | POA: Diagnosis not present

## 2015-04-12 DIAGNOSIS — Z7982 Long term (current) use of aspirin: Secondary | ICD-10-CM | POA: Insufficient documentation

## 2015-04-12 DIAGNOSIS — Z7902 Long term (current) use of antithrombotics/antiplatelets: Secondary | ICD-10-CM | POA: Diagnosis not present

## 2015-04-12 DIAGNOSIS — L089 Local infection of the skin and subcutaneous tissue, unspecified: Secondary | ICD-10-CM | POA: Diagnosis present

## 2015-04-12 DIAGNOSIS — L02214 Cutaneous abscess of groin: Secondary | ICD-10-CM | POA: Insufficient documentation

## 2015-04-12 DIAGNOSIS — Z79899 Other long term (current) drug therapy: Secondary | ICD-10-CM | POA: Insufficient documentation

## 2015-04-12 DIAGNOSIS — E1140 Type 2 diabetes mellitus with diabetic neuropathy, unspecified: Secondary | ICD-10-CM | POA: Insufficient documentation

## 2015-04-12 DIAGNOSIS — F17210 Nicotine dependence, cigarettes, uncomplicated: Secondary | ICD-10-CM | POA: Insufficient documentation

## 2015-04-12 MED ORDER — TRAMADOL HCL 50 MG PO TABS: 50.0000 mg | ORAL_TABLET | Freq: Four times a day (QID) | ORAL | Status: DC | PRN

## 2015-04-12 MED ORDER — SULFAMETHOXAZOLE-TRIMETHOPRIM 800-160 MG PO TABS: 1.0000 | ORAL_TABLET | Freq: Two times a day (BID) | ORAL | Status: DC

## 2015-04-12 NOTE — ED Notes (Signed)
Pt arrives to ER c/o left sided scrotal boil X 3 days. Hx of similar but this one has drained per patient. Size of half dollar approx. Pt alert and oriented X4, active, cooperative, pt in NAD. RR even and unlabored, color WNL.

## 2015-04-12 NOTE — ED Notes (Signed)
Pt alert and oriented X4, active, cooperative, pt in NAD. RR even and unlabored, color WNL.  Pt informed to return if any life threatening symptoms occur.   

## 2015-04-12 NOTE — ED Provider Notes (Signed)
[facility] Emergency Department Provider Note  ____________________________________________  Time seen: On arrival  I have reviewed the triage vital signs and the nursing notes.   HISTORY  Chief Complaint Recurrent Skin Infections    HPI Martin Mullen is a 35 y.o. male who presents with draining abscess to the left groin. Patient has had this numerous times in the past and has even required surgery before. He reports this boil has been there for approximately 2 days and has been draining. There is no surrounding erythema. No fevers or chills. Patient does have a history of diabetes    Past Medical History  Diagnosis Date  . 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  . Diabetes mellitus without complication (HCC)   . Chicken pox   . Coronary artery disease     Inferior ST elevation myocardial infarction in July of 2014. Cardiac catheterization showed 95% distal RCA stenosis and 90% first diagonal stenosis with normal ejection fraction. He underwent PCI in bare-metal stent placement to the distal RCA.  Marland Kitchen Hypercholesterolemia   . Tobacco use     Patient Active Problem List   Diagnosis Date Noted  . Chest pain 03/05/2015  . Coronary artery disease   . Obesity (BMI 30-39.9) 07/09/2013  . DM type 2, uncontrolled, with neuropathy (HCC) 07/09/2013  . STEMI s/p RCA stent 08/2012 09/05/2012  . HTN (hypertension) 09/05/2012  . HLD (hyperlipidemia) 09/05/2012  . Nicotine dependence 09/05/2012  . Adjustment disorder with mixed anxiety and depressed mood 09/05/2012    Past Surgical History  Procedure Laterality Date  . Coronary angioplasty with stent placement    . Left heart catheterization with coronary angiogram N/A 08/28/2012    Procedure: LEFT HEART CATHETERIZATION WITH CORONARY ANGIOGRAM;  Surgeon: Pamella Pert, MD;  Location: [facility] CATH LAB;  Service: Cardiovascular;  Laterality: N/A;    Current  Outpatient Rx  Name  Route  Sig  Dispense  Refill  . aspirin 81 MG EC tablet   Oral   Take 1 tablet (81 mg total) by mouth daily.   30 tablet   4   . clopidogrel (PLAVIX) 75 MG tablet      TAKE ONE TABLET BY MOUTH ONCE DAILY MUST  SCHEDULE  ANNUAL  PHYSICAL  FOR  MORE  REFILLS   30 tablet   0   . glyBURIDE (DIABETA) 5 MG tablet      TAKE ONE TABLET BY MOUTH TWICE DAILY WITH MEALS **MUST  SCHEDULE  ANNUAL  PHYSICAL  FOR  MORE  REFILLS**   60 tablet   0   . metFORMIN (GLUCOPHAGE) 1000 MG tablet      TAKE ONE TABLET BY MOUTH TWICE DAILY WITH MEALS **MUST  SCHEDULE  ANNUAL  PHYSICAL  FOR  MORE  REFILLS**   60 tablet   0   . metoprolol tartrate (LOPRESSOR) 25 MG tablet   Oral   Take 1 tablet (25 mg total) by mouth 2 (two) times daily.   60 tablet   5   . Multiple Vitamins-Minerals (MULTIVITAMIN PO)   Oral   Take 1 capsule by mouth daily.         . nitroGLYCERIN (NITROSTAT) 0.4 MG SL tablet   Sublingual   Place 1 tablet (0.4 mg total) under the tongue every 5 (five) minutes x 3 doses as needed for chest pain.   30 tablet   12   . Omega-3 Fatty Acids (  FISH OIL) 1200 MG CAPS   Oral   Take 1,200 mg by mouth 3 (three) times daily.         . pravastatin (PRAVACHOL) 40 MG tablet      TAKE TWO TABLETS BY MOUTH IN THE EVENING AFTER  MEALS   60 tablet   0   . sulfamethoxazole-trimethoprim (BACTRIM DS,SEPTRA DS) 800-160 MG tablet   Oral   Take 1 tablet by mouth 2 (two) times daily.   14 tablet   0   . traMADol (ULTRAM) 50 MG tablet   Oral   Take 1 tablet (50 mg total) by mouth every 6 (six) hours as needed.   20 tablet   0     Allergies Review of patient's allergies indicates no known allergies.  Family History  Problem Relation Age of Onset  . Arthritis Mother     Rheumatoid  . Diabetes Mother   . Hyperlipidemia Mother   . Diabetes Father   . Cancer Maternal Grandfather     Prostate  . Parkinson's disease Maternal Grandfather     Social  History Social History  Substance Use Topics  . Smoking status: Current Every Day Smoker -- 1.00 packs/day for 20 years    Types: Cigarettes  . Smokeless tobacco: None  . Alcohol Use: 0.0 oz/week    0 Standard drinks or equivalent per week     Comment: occasional    Review of Systems  Constitutional: Negative for fever.    Genitourinary: Abscess as above Musculoskeletal: Negative for swelling or rash Skin: Negative for rash. Abscess as above    ____________________________________________   PHYSICAL EXAM:  VITAL SIGNS: ED Triage Vitals  Enc Vitals Group     BP 04/12/15 0753 122/81 mmHg     Pulse Rate 04/12/15 0753 91     Resp 04/12/15 0753 18     Temp 04/12/15 0753 97.6 F (36.4 C)     Temp Source 04/12/15 0753 Oral     SpO2 04/12/15 0753 100 %     Weight 04/12/15 0753 250 lb (113.399 kg)     Height 04/12/15 0753 5\' 8"  (1.727 m)     Head Cir --      Peak Flow --      Pain Score 04/12/15 0754 7     Pain Loc --      Pain Edu? --      Excl. in GC? --      Constitutional: Alert and oriented. Well appearing and in no distress. Eyes: Conjunctivae are normal.  ENT   Head: Normocephalic and atraumatic.   Mouth/Throat: Mucous membranes are moist. Cardiovascular: Normal rate, regular rhythm.  Respiratory: Normal respiratory effort without tachypnea nor retractions.  Gastrointestinal: Soft and non-tender in all quadrants.  GU: Patient with 1 x 1 cm draining abscess without surrounding erythema in the left groin. It does not involve the scrotum. No crepitus or streaking.  Skin:  Skin is otherwise warm, dry and intact. See above Psychiatric: Mood and affect are normal. Patient exhibits appropriate insight and judgment.  ____________________________________________    LABS (pertinent positives/negatives)  Labs Reviewed - No data to display  ____________________________________________     ____________________________________________    RADIOLOGY I  have personally reviewed any xrays that were ordered on this patient: None  ____________________________________________   PROCEDURES  Procedure(s) performed: none   ____________________________________________   INITIAL IMPRESSION / ASSESSMENT AND PLAN / ED COURSE  Pertinent labs & imaging results that were available during my care  of the patient were reviewed by me and considered in my medical decision making (see chart for details).  Patient with history of diabetes and recurrent groin abscesses. Today he has a small abscess which is draining. No I&D indicated. No surrounding erythema or concern for Fournier's gangrene. We will treat with Bactrim and he will follow up with his surgeon in 2 days if no improvement. Return precautions discussed  ____________________________________________   FINAL CLINICAL IMPRESSION(S) / ED DIAGNOSES  Final diagnoses:  Abscess of groin, left     Jene Every, MD 04/12/15 [phone]

## 2015-04-12 NOTE — Discharge Instructions (Signed)

## 2015-05-28 ENCOUNTER — Other Ambulatory Visit: Payer: Self-pay | Admitting: Internal Medicine

## 2015-05-28 NOTE — Telephone Encounter (Signed)
No recent or future appt., please advise  

## 2015-05-28 NOTE — Telephone Encounter (Signed)
Will send 30 day supply. If he does not make follow up appt, no refills will be given.

## 2015-06-01 NOTE — Telephone Encounter (Signed)
Attempted to contact patient to notify him of Regina's comments.  No answer and no voice mail at home number.  Work number is no longer a working number.  Will continue attempts to contact patient.

## 2015-07-16 ENCOUNTER — Encounter: Payer: Self-pay | Admitting: Internal Medicine

## 2015-07-16 ENCOUNTER — Ambulatory Visit (INDEPENDENT_AMBULATORY_CARE_PROVIDER_SITE_OTHER): Payer: BLUE CROSS/BLUE SHIELD | Admitting: Internal Medicine

## 2015-07-16 VITALS — BP 146/84 | HR 94 | Temp 98.4°F | Wt 243.0 lb

## 2015-07-16 DIAGNOSIS — E785 Hyperlipidemia, unspecified: Secondary | ICD-10-CM

## 2015-07-16 DIAGNOSIS — R454 Irritability and anger: Secondary | ICD-10-CM

## 2015-07-16 DIAGNOSIS — E1165 Type 2 diabetes mellitus with hyperglycemia: Secondary | ICD-10-CM

## 2015-07-16 DIAGNOSIS — I10 Essential (primary) hypertension: Secondary | ICD-10-CM

## 2015-07-16 DIAGNOSIS — I2510 Atherosclerotic heart disease of native coronary artery without angina pectoris: Secondary | ICD-10-CM

## 2015-07-16 DIAGNOSIS — F4323 Adjustment disorder with mixed anxiety and depressed mood: Secondary | ICD-10-CM

## 2015-07-16 DIAGNOSIS — I25810 Atherosclerosis of coronary artery bypass graft(s) without angina pectoris: Secondary | ICD-10-CM

## 2015-07-16 DIAGNOSIS — E669 Obesity, unspecified: Secondary | ICD-10-CM

## 2015-07-16 DIAGNOSIS — F17219 Nicotine dependence, cigarettes, with unspecified nicotine-induced disorders: Secondary | ICD-10-CM

## 2015-07-16 MED ORDER — CITALOPRAM HYDROBROMIDE 20 MG PO TABS: ORAL_TABLET | ORAL | Status: DC

## 2015-07-16 MED ORDER — GLYBURIDE 5 MG PO TABS: ORAL_TABLET | ORAL | Status: DC

## 2015-07-16 MED ORDER — METFORMIN HCL 1000 MG PO TABS: ORAL_TABLET | ORAL | Status: DC

## 2015-07-16 MED ORDER — PRAVASTATIN SODIUM 40 MG PO TABS: ORAL_TABLET | ORAL | Status: DC

## 2015-07-16 MED ORDER — CLOPIDOGREL BISULFATE 75 MG PO TABS: ORAL_TABLET | ORAL | Status: DC

## 2015-07-16 MED ORDER — METOPROLOL TARTRATE 25 MG PO TABS: 25.0000 mg | ORAL_TABLET | Freq: Two times a day (BID) | ORAL | Status: DC

## 2015-07-16 NOTE — Addendum Note (Signed)
Addended by: [name] on: 07/16/2015 09:45 PM   Modules accepted: Orders

## 2015-07-16 NOTE — Progress Notes (Signed)
Pre visit review using our clinic review tool, if applicable. No additional management support is needed unless otherwise documented below in the visit note. 

## 2015-07-16 NOTE — Assessment & Plan Note (Signed)
Slightly elevated today Will monitor, if persist, start Lisinopril

## 2015-07-16 NOTE — Assessment & Plan Note (Signed)
Discussed the importance of medication adherence A1C, microalbumin and Lipid Profile today Foot exam today Vaccines UTD Foot exam today Encouraged him to consume a low fat, low carb diet and exercise to lose weight Encouraged him to schedule an eye exam Continue Metformin and Glyburide

## 2015-07-16 NOTE — Assessment & Plan Note (Signed)
Deteriorated Support offered today eRx for Celexa 20 mg daily

## 2015-07-16 NOTE — Assessment & Plan Note (Signed)
Encouraged him to restart Pravastatin Advised him to consume a low fat diet, and exercise to lose weight

## 2015-07-16 NOTE — Patient Instructions (Signed)

## 2015-07-16 NOTE — Assessment & Plan Note (Signed)
Encouraged him to work on diet and exercise 

## 2015-07-16 NOTE — Assessment & Plan Note (Signed)
Discussed smoking cessation He is not ready at this time

## 2015-07-16 NOTE — Progress Notes (Signed)
Subjective:    Patient ID: Martin Mullen, male    DOB: [birth year], 35 y.o.   MRN: [id number]  HPI  Pt presents to the clinic today for follow up of chronic medical conditions. He has not been seen since 03/2014.  Anxiety and Depression: Deteriorated. This is triggered by financial struggle, lack on insurance to pay for his medical bills and chronic health issues. He reports he has become very angry and irritable. He doesn't want to feel like this everyday. He is interested in treatment at this time, but does not feel like he can afford a therapist. Denies SI/HI.  DM 2, uncontrolled with neuropathy: His last A1C was 11.4%. He does not check his sugars because he does not have a meter. He reports he is still taking the Metformin and Glyburide only about 3 x per week. Eye exam was in 2014. Flu 02/2015. Pneumonia shot 08/2012. Foot exam 03/2014. He does not adhere to a low carb diet.  HTN: BP elevated today at 146/84. Not medicated for HBP. He is no longer following with his cardiologist Dr. Jacinto Halim.  HLD: His cholesterol was checked 02/2015. Triglycerides 466, LDL not calculated. He reports he stopped taking the Pravastatin. He does not adhere to a low fat diet.  CAD s/p STEMI with stent 08/2012. On Metoprolol and Plavix. No angina. Has not had to use Nitrostat. He no longer follows with Dr. Jacinto Halim. He continues to smoke 1 ppd and last used cocaine 3 weeks ago.  Obesity: Has lost 13 lbs since last visit. BMI 36.96. Does not adhere to any specific diet, does not exercise.  Review of Systems  Past Medical History  Diagnosis Date  . 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  . Diabetes mellitus without complication (HCC)   . Chicken pox   . Coronary artery disease     Inferior ST elevation myocardial infarction in July of 2014. Cardiac catheterization showed 95% distal RCA stenosis and 90% first diagonal stenosis with normal ejection fraction. He  underwent PCI in bare-metal stent placement to the distal RCA.  Marland Kitchen Hypercholesterolemia   . Tobacco use     Current Outpatient Prescriptions  Medication Sig Dispense Refill  . aspirin 81 MG EC tablet Take 1 tablet (81 mg total) by mouth daily. 30 tablet 4  . clopidogrel (PLAVIX) 75 MG tablet TAKE ONE TABLET BY MOUTH ONCE DAILY MUST  SCHEDULE  ANNUAL  PHYSICAL  FOR  MORE  REFILLS 30 tablet 0  . glyBURIDE (DIABETA) 5 MG tablet TAKE ONE TABLET BY MOUTH TWICE DAILY WITH MEALS **MUST  SCHEDULE  ANNUAL  PHYSICAL  FOR  MORE  REFILLS** 60 tablet 0  . metFORMIN (GLUCOPHAGE) 1000 MG tablet TAKE ONE TABLET BY MOUTH TWICE DAILY WITH MEALS **MUST  SCHEDULE  ANNUAL  PHYSICAL  FOR  MORE  REFILLS** 60 tablet 0  . metoprolol tartrate (LOPRESSOR) 25 MG tablet Take 1 tablet (25 mg total) by mouth 2 (two) times daily. 60 tablet 5  . Multiple Vitamins-Minerals (MULTIVITAMIN PO) Take 1 capsule by mouth daily.    . nitroGLYCERIN (NITROSTAT) 0.4 MG SL tablet Place 1 tablet (0.4 mg total) under the tongue every 5 (five) minutes x 3 doses as needed for chest pain. 30 tablet 12  . Omega-3 Fatty Acids (FISH OIL) 1200 MG CAPS Take 1,200 mg by mouth 3 (three) times daily.    . pravastatin (PRAVACHOL) 40 MG tablet TAKE TWO TABLETS BY MOUTH  IN THE EVENING AFTER  MEALS 60 tablet 0  . traMADol (ULTRAM) 50 MG tablet Take 1 tablet (50 mg total) by mouth every 6 (six) hours as needed. 20 tablet 0   No current facility-administered medications for this visit.    No Known Allergies  Family History  Problem Relation Age of Onset  . Arthritis Mother     Rheumatoid  . Diabetes Mother   . Hyperlipidemia Mother   . Diabetes Father   . Cancer Maternal Grandfather     Prostate  . Parkinson's disease Maternal Grandfather     Social History   Social History  . Marital Status: Married    Spouse Name: N/A  . Number of Children: N/A  . Years of Education: N/A   Occupational History  . Not on file.   Social History Main  Topics  . Smoking status: Current Every Day Smoker -- 1.00 packs/day for 20 years    Types: Cigarettes  . Smokeless tobacco: Not on file  . Alcohol Use: 0.0 oz/week    0 Standard drinks or equivalent per week     Comment: occasional  . Drug Use: Yes    Special: Cocaine  . Sexual Activity: Yes    Birth Control/ Protection: None   Other Topics Concern  . Not on file   Social History Narrative     Constitutional: Denies fever, malaise, fatigue, headache or abrupt weight changes.  HEENT: Denies eye pain, eye redness, ear pain, ringing in the ears, wax buildup, runny nose, nasal congestion, bloody nose, or sore throat. Respiratory: Denies difficulty breathing, shortness of breath, cough or sputum production.   Cardiovascular: Denies chest pain, chest tightness, palpitations or swelling in the hands or feet.  Gastrointestinal: Denies abdominal pain, bloating, constipation, diarrhea or blood in the stool.  Musculoskeletal: Denies decrease in range of motion, difficulty with gait, muscle pain or joint pain and swelling.  Skin: Denies redness, rashes, lesions or ulcercations.  Neurological: Pt reports tingling in BLE. Denies dizziness, difficulty with memory, difficulty with speech or problems with balance and coordination.  Psych: Pt reports history of anxiety and depression. Denies SI/HI.  No other specific complaints in a complete review of systems (except as listed in HPI above).     Objective:   Physical Exam   BP 146/84 mmHg  Pulse 94  Temp(Src) 98.4 F (36.9 C) (Oral)  Wt 243 lb (110.224 kg)  SpO2 98%  Wt Readings from Last 3 Encounters:  07/16/15 243 lb (110.224 kg)  04/12/15 250 lb (113.399 kg)  03/05/15 243 lb 9.6 oz (110.496 kg)    General: Appears his stated age, obese in NAD, smells like smoke. Skin: Warm, dry and intact.  HEENT: Head: normal shape and size; Eyes: sclera white, no icterus, conjunctiva pink, PERRLA and EOMs intact;  Cardiovascular: Normal rate  and rhythm. S1,S2 noted.  No murmur, rubs or gallops noted. No JVD or BLE edema. No carotid bruits noted. Pulmonary/Chest: Normal effort and positive vesicular breath sounds. No respiratory distress. No wheezes, rales or ronchi noted.  Neurological: Alert and oriented. Coordination normal.  Psychiatric: Mood and affect flat. Behavior is normal. Judgment and thought content delayed.     BMET    Component Value Date/Time   NA 133* 03/06/2015 0641   NA 139 01/29/2013 0040   K 3.5 03/06/2015 0641   K 4.0 01/29/2013 0040   CL 100* 03/06/2015 0641   CL 107 01/29/2013 0040   CO2 22 03/06/2015 0641   CO2  28 01/29/2013 0040   GLUCOSE 342* 03/06/2015 0641   GLUCOSE 228* 01/29/2013 0040   BUN 11 03/06/2015 0641   BUN 15 01/29/2013 0040   CREATININE 0.76 03/06/2015 0641   CREATININE 0.88 01/29/2013 0040   CALCIUM 8.4* 03/06/2015 0641   CALCIUM 9.2 01/29/2013 0040   GFRNONAA >60 03/06/2015 0641   GFRNONAA >60 01/29/2013 0040   GFRAA >60 03/06/2015 0641   GFRAA >60 01/29/2013 0040    Lipid Panel     Component Value Date/Time   CHOL 152 03/06/2015 0641   TRIG 466* 03/06/2015 0641   HDL 21* 03/06/2015 0641   CHOLHDL 7.2 03/06/2015 0641   VLDL UNABLE TO CALCULATE IF TRIGLYCERIDE OVER 400 mg/dL 45/40/9811 9147   LDLCALC UNABLE TO CALCULATE IF TRIGLYCERIDE OVER 400 mg/dL 82/95/6213 0865    CBC    Component Value Date/Time   WBC 11.9* 03/06/2015 0641   WBC 14.0* 01/29/2013 0040   RBC 5.43 03/06/2015 0641   RBC 5.21 01/29/2013 0040   HGB 17.2 03/06/2015 0641   HGB 16.4 01/29/2013 0040   HCT 51.6 03/06/2015 0641   HCT 48.2 01/29/2013 0040   PLT 182 03/06/2015 0641   PLT 222 01/29/2013 0040   MCV 94.9 03/06/2015 0641   MCV 93 01/29/2013 0040   MCH 31.7 03/06/2015 0641   MCH 31.4 01/29/2013 0040   MCHC 33.4 03/06/2015 0641   MCHC 34.0 01/29/2013 0040   RDW 12.9 03/06/2015 0641   RDW 13.8 01/29/2013 0040    Hgb A1C Lab Results  Component Value Date   HGBA1C 11.4*  03/05/2015        Assessment & Plan:

## 2015-07-16 NOTE — Assessment & Plan Note (Signed)
No angina Discussed importance of medication compliance with Toprol, ASA, Plavix and Pravastatin Encouraged him to consume a low fat diet and exercise to lose weight Advised him to stop smoking and using recreational drugs

## 2015-07-17 LAB — COMPREHENSIVE METABOLIC PANEL
ALK PHOS: 148 U/L — AB (ref 39–117)
ALT: 21 U/L (ref 0–53)
AST: 16 U/L (ref 0–37)
Albumin: 3.9 g/dL (ref 3.5–5.2)
BILIRUBIN TOTAL: 0.4 mg/dL (ref 0.2–1.2)
BUN: 12 mg/dL (ref 6–23)
CALCIUM: 9.5 mg/dL (ref 8.4–10.5)
CO2: 27 meq/L (ref 19–32)
Chloride: 97 mEq/L (ref 96–112)
Creatinine, Ser: 1.05 mg/dL (ref 0.40–1.50)
GFR: 85.34 mL/min (ref >60.00)
Glucose, Bld: 502 mg/dL (ref 70–99)
Potassium: 4.7 mEq/L (ref 3.5–5.1)
Sodium: 133 mEq/L — ABNORMAL LOW (ref 135–145)
TOTAL PROTEIN: 6.7 g/dL (ref 6.0–8.3)

## 2015-07-17 LAB — CBC
HCT: 51.3 % (ref 39.0–52.0)
HEMOGLOBIN: 17.7 g/dL — AB (ref 13.0–17.0)
MCHC: 34.4 g/dL (ref 30.0–36.0)
MCV: 94.4 fl (ref 78.0–100.0)
Platelets: 229 10*3/uL (ref 150.0–400.0)
RBC: 5.43 Mil/uL (ref 4.22–5.81)
RDW: 13.6 % (ref 11.5–15.5)
WBC: 14 10*3/uL — ABNORMAL HIGH (ref 4.0–10.5)

## 2015-07-17 LAB — LIPID PANEL
CHOL/HDL RATIO: 7
Cholesterol: 190 mg/dL (ref 0–200)
HDL: 25.7 mg/dL — AB (ref >39.00)

## 2015-07-17 LAB — LDL CHOLESTEROL, DIRECT: Direct LDL: 119 mg/dL

## 2015-07-17 LAB — HEMOGLOBIN A1C: Hgb A1c MFr Bld: 11.9 % — ABNORMAL HIGH (ref 4.6–6.5)

## 2015-07-21 NOTE — Addendum Note (Signed)
Addended by: Baldomero Lamy on: 07/21/2015 02:43 PM   Modules accepted: Orders

## 2015-07-22 MED ORDER — GLUCOSE BLOOD VI STRP: 1.0000 | ORAL_STRIP | Freq: Three times a day (TID) | Status: DC | PRN

## 2015-07-22 MED ORDER — BAYER CONTOUR NEXT MONITOR W/DEVICE KIT: PACK | Status: DC

## 2015-07-22 NOTE — Addendum Note (Signed)
Addended by: Roena Malady on: 07/22/2015 09:52 AM   Modules accepted: Orders

## 2015-09-22 ENCOUNTER — Other Ambulatory Visit: Payer: Self-pay | Admitting: Internal Medicine

## 2015-09-22 DIAGNOSIS — R454 Irritability and anger: Secondary | ICD-10-CM

## 2015-10-09 ENCOUNTER — Other Ambulatory Visit: Payer: Self-pay

## 2015-10-09 MED ORDER — PRAVASTATIN SODIUM 40 MG PO TABS: ORAL_TABLET | ORAL | 2 refills | Status: DC

## 2015-10-12 ENCOUNTER — Telehealth: Payer: Self-pay

## 2015-10-12 NOTE — Telephone Encounter (Signed)
Martin Mullen pts wife (no DPR) said pt has toothache and needs abx. appt scheduled to see R [name] 10/13/15.  Also pravastatin is not covered by pt ins and request different med to substitute. Mrs [name] will contact ins co to see what med is more affordable as sub for pravasttin and cb with info. FYI to Pamala Hurry NP.

## 2015-10-12 NOTE — Telephone Encounter (Signed)
Noted, will discuss at upcoming appt 

## 2015-10-13 ENCOUNTER — Encounter: Payer: Self-pay | Admitting: Internal Medicine

## 2015-10-13 ENCOUNTER — Ambulatory Visit (INDEPENDENT_AMBULATORY_CARE_PROVIDER_SITE_OTHER): Payer: BLUE CROSS/BLUE SHIELD | Admitting: Internal Medicine

## 2015-10-13 VITALS — BP 132/88 | HR 84 | Temp 98.0°F | Wt 250.8 lb

## 2015-10-13 DIAGNOSIS — E1140 Type 2 diabetes mellitus with diabetic neuropathy, unspecified: Secondary | ICD-10-CM

## 2015-10-13 DIAGNOSIS — IMO0002 Reserved for concepts with insufficient information to code with codable children: Secondary | ICD-10-CM

## 2015-10-13 DIAGNOSIS — I2510 Atherosclerotic heart disease of native coronary artery without angina pectoris: Secondary | ICD-10-CM

## 2015-10-13 DIAGNOSIS — E785 Hyperlipidemia, unspecified: Secondary | ICD-10-CM

## 2015-10-13 DIAGNOSIS — E1165 Type 2 diabetes mellitus with hyperglycemia: Secondary | ICD-10-CM

## 2015-10-13 DIAGNOSIS — I10 Essential (primary) hypertension: Secondary | ICD-10-CM | POA: Diagnosis not present

## 2015-10-13 DIAGNOSIS — F4323 Adjustment disorder with mixed anxiety and depressed mood: Secondary | ICD-10-CM

## 2015-10-13 DIAGNOSIS — E669 Obesity, unspecified: Secondary | ICD-10-CM

## 2015-10-13 MED ORDER — CITALOPRAM HYDROBROMIDE 40 MG PO TABS: 40.0000 mg | ORAL_TABLET | Freq: Every day | ORAL | 3 refills | Status: DC

## 2015-10-13 NOTE — Assessment & Plan Note (Signed)
A1C today Discussed taking medication as prescribed, not based on BS readings Advised him to consume a low fat, low carb diet and exercise to lose weight Advised him to make an appt for an eye exam Immunizations UTD

## 2015-10-13 NOTE — Assessment & Plan Note (Signed)
No improvement on current dose of Celexa Will increase dose  Call and let me know how you are doing in 4 weeks

## 2015-10-13 NOTE — Assessment & Plan Note (Signed)
Lipid Profile today Insurance wont pay for Pravastatin, will likely switch to Crestor Encouraged him to consume a low fat diet

## 2015-10-13 NOTE — Assessment & Plan Note (Signed)
Encouraged him to work on diet and exercise 

## 2015-10-13 NOTE — Patient Instructions (Signed)

## 2015-10-13 NOTE — Assessment & Plan Note (Signed)
Controlled off meds  Will monitor 

## 2015-10-13 NOTE — Assessment & Plan Note (Signed)
Continue Metoprolol and Plavix Discussed stopping cocaine use

## 2015-10-13 NOTE — Progress Notes (Signed)
Subjective:    Patient ID: Martin Mullen, male    DOB: 1980/09/19, 35 y.o.   MRN: [id number]  HPI  Pt presents to the clinic today for 3 month follow up of chronic medical conditions.   Anxiety and Depression: Deteriorated. This is triggered by financial struggle, lack on insurance to pay for his medical bills and chronic health issues. He was started on Celexa at his last visit. He has been taking it as prescribed and he reports he has not noticed much of a change. Denies SI/HI.  DM 2, uncontrolled with neuropathy: His last A1C was 11.9%. He was not taking his medications as directed and we discussed at his last visit the importance of medication compliance. His sugars range 130's -140's.  He reports he is taking the Metformin and Glyburide as prescribed. Eye exam was in 2014. Flu 02/2015. Pneumonia shot 08/2012. Foot exam 06/2015. He does not adhere to a low carb diet.  HTN: BP elevated today at 132/88. Not medicated for HBP. He is no longer following with his cardiologist Dr. Einar Gip.  HLD: His cholesterol was checked 07/2015. Triglycerides 563, LDL 119. He has been taking the Pravastatin as prescribed, but he reports the insurance will no longer fill it due to a quantity limit. He does not adhere to a low fat diet.  CAD s/p STEMI with stent 08/2012. On Metoprolol and Plavix. No angina. Has not had to use Nitrostat. He no longer follows with Dr. Einar Gip. He continues to smoke 1 ppd and last used cocaine 3 weeks ago.  Obesity: Has gained 7 lbs since last visit. BMI 38.13. Does not adhere to any specific diet, does not exercise.  Review of Systems  Past Medical History:  Diagnosis Date  . Acute transmural inferior wall MI ([place]) 08/28/2012   .5 x 12 mm Veri-flex stent non-DES.  Marland Kitchen Chicken pox   . Coronary artery disease    Inferior ST elevation myocardial infarction in July of 2014. Cardiac catheterization showed 95% distal RCA stenosis and 90% first diagonal stenosis with normal ejection  fraction. He underwent PCI in bare-metal stent placement to the distal RCA.  . Diabetes mellitus without complication ([place])   . Hypercholesterolemia   . Tobacco use     Current Outpatient Prescriptions  Medication Sig Dispense Refill  . aspirin 81 MG EC tablet Take 1 tablet (81 mg total) by mouth daily. 30 tablet 4  . Blood Glucose Monitoring Suppl (BAYER CONTOUR NEXT MONITOR) w/Device KIT 1 Device 1 kit 0  . citalopram (CELEXA) 20 MG tablet Take 1 tablet (20 mg total) by mouth daily. 30 tablet 1  . clopidogrel (PLAVIX) 75 MG tablet TAKE ONE TABLET BY MOUTH ONCE DAILY 30 tablet 2  . glucose blood (BAYER CONTOUR NEXT TEST) test strip 1 each by Other route 3 (three) times daily as needed for other. Use as instructed 100 each 12  . glyBURIDE (DIABETA) 5 MG tablet TAKE ONE TABLET BY MOUTH TWICE DAILY WITH MEALS 60 tablet 2  . metFORMIN (GLUCOPHAGE) 1000 MG tablet TAKE ONE TABLET BY MOUTH TWICE DAILY WITH MEALS 60 tablet 2  . metoprolol tartrate (LOPRESSOR) 25 MG tablet Take 1 tablet (25 mg total) by mouth 2 (two) times daily. 60 tablet 2  . Multiple Vitamins-Minerals (MULTIVITAMIN PO) Take 1 capsule by mouth daily.    . nitroGLYCERIN (NITROSTAT) 0.4 MG SL tablet Place 1 tablet (0.4 mg total) under the tongue every 5 (five) minutes x 3 doses as needed for chest pain. [place]  tablet 12  . Omega-3 Fatty Acids (FISH OIL) 1200 MG CAPS Take 1,200 mg by mouth 3 (three) times daily.    . pravastatin (PRAVACHOL) 40 MG tablet TAKE TWO TABLETS BY MOUTH IN THE EVENING AFTER  MEALS 60 tablet 2   No current facility-administered medications for this visit.     No Known Allergies  Family History  Problem Relation Age of Onset  . Arthritis Mother     Rheumatoid  . Diabetes Mother   . Hyperlipidemia Mother   . Diabetes Father   . Cancer Maternal Grandfather     Prostate  . Parkinson's disease Maternal Grandfather     Social History   Social History  . Marital status: Married    Spouse name: N/A  .  Number of children: N/A  . Years of education: N/A   Occupational History  . Not on file.   Social History Main Topics  . Smoking status: Current Every Day Smoker    Packs/day: 1.00    Years: 20.00    Types: Cigarettes  . Smokeless tobacco: Not on file  . Alcohol use 0.0 oz/week     Comment: occasional  . Drug use:     Types: Cocaine  . Sexual activity: Yes    Birth control/ protection: None   Other Topics Concern  . Not on file   Social History Narrative  . No narrative on file     Constitutional: Denies fever, malaise, fatigue, headache or abrupt weight changes.  HEENT: Denies eye pain, eye redness, ear pain, ringing in the ears, wax buildup, runny nose, nasal congestion, bloody nose, or sore throat. Respiratory: Denies difficulty breathing, shortness of breath, cough or sputum production.   Cardiovascular: Denies chest pain, chest tightness, palpitations or swelling in the hands or feet.  Gastrointestinal: Denies abdominal pain, bloating, constipation, diarrhea or blood in the stool.  Musculoskeletal: Pt reports back pain. Denies decrease in range of motion, difficulty with gait, muscle pain or joint swelling.  Skin: Denies redness, rashes, lesions or ulcercations.  Neurological: Pt reports tingling in BLE. Denies dizziness, difficulty with memory, difficulty with speech or problems with balance and coordination.  Psych: Pt reports history of anxiety and depression. Denies SI/HI.  No other specific complaints in a complete review of systems (except as listed in HPI above).     Objective:   Physical Exam   BP 132/88   Pulse 84   Temp 98 F (36.7 C) (Oral)   Wt 250 lb 12 oz (113.7 kg)   SpO2 98%   BMI 38.13 kg/m    Wt Readings from Last 3 Encounters:  07/16/15 243 lb (110.2 kg)  04/12/15 250 lb (113.4 kg)  03/05/15 243 lb 9.6 oz (110.5 kg)    General: Appears his stated age, obese in NAD, smells like smoke. Skin: Warm, dry and intact.  HEENT: Head:  normal shape and size; Eyes: sclera white, no icterus, conjunctiva pink, PERRLA and EOMs intact;  Cardiovascular: Normal rate and rhythm. S1,S2 noted.  No murmur, rubs or gallops noted. No JVD or BLE edema. No carotid bruits noted. Pulmonary/Chest: Normal effort and positive vesicular breath sounds. No respiratory distress. No wheezes, rales or ronchi noted.  Neurological: Alert and oriented. Coordination normal.  Psychiatric: Mood and affect flat. Behavior is normal. Judgment and thought content delayed.     BMET    Component Value Date/Time   NA 133 (L) 07/16/2015 1546   NA 139 01/29/2013 0040   K 4.7 07/16/2015 1546  K 4.0 01/29/2013 0040   CL 97 07/16/2015 1546   CL 107 01/29/2013 0040   CO2 27 07/16/2015 1546   CO2 28 01/29/2013 0040   GLUCOSE 502 (HH) 07/16/2015 1546   GLUCOSE 228 (H) 01/29/2013 0040   BUN 12 07/16/2015 1546   BUN 15 01/29/2013 0040   CREATININE 1.05 07/16/2015 1546   CREATININE 0.88 01/29/2013 0040   CALCIUM 9.5 07/16/2015 1546   CALCIUM 9.2 01/29/2013 0040   GFRNONAA >60 03/06/2015 0641   GFRNONAA >60 01/29/2013 0040   GFRAA >60 03/06/2015 0641   GFRAA >60 01/29/2013 0040    Lipid Panel     Component Value Date/Time   CHOL 190 07/16/2015 1546   TRIG (H) 07/16/2015 1546    576.0 Triglyceride is over 400; calculations on Lipids are invalid.   HDL 25.70 (L) 07/16/2015 1546   CHOLHDL 7 07/16/2015 1546   VLDL UNABLE TO CALCULATE IF TRIGLYCERIDE OVER 400 mg/dL 03/06/2015 0641   LDLCALC UNABLE TO CALCULATE IF TRIGLYCERIDE OVER 400 mg/dL 03/06/2015 0641    CBC    Component Value Date/Time   WBC 14.0 (H) 07/16/2015 1546   RBC 5.43 07/16/2015 1546   HGB 17.7 (H) 07/16/2015 1546   HGB 16.4 01/29/2013 0040   HCT 51.3 07/16/2015 1546   HCT 48.2 01/29/2013 0040   PLT 229.0 07/16/2015 1546   PLT 222 01/29/2013 0040   MCV 94.4 07/16/2015 1546   MCV 93 01/29/2013 0040   MCH 31.7 03/06/2015 0641   MCHC 34.4 07/16/2015 1546   RDW 13.6 07/16/2015 1546    RDW 13.8 01/29/2013 0040    Hgb A1C Lab Results  Component Value Date   HGBA1C 11.9 (H) 07/16/2015        Assessment & Plan:

## 2015-10-14 LAB — LIPID PANEL
Cholesterol: 118 mg/dL (ref 0–200)
HDL: 33.1 mg/dL — ABNORMAL LOW (ref >39.00)
LDL Cholesterol: 63 mg/dL (ref 0–99)
NonHDL: 85.18
TRIGLYCERIDES: 111 mg/dL (ref 0.0–149.0)
Total CHOL/HDL Ratio: 4
VLDL: 22.2 mg/dL (ref 0.0–40.0)

## 2015-10-14 LAB — HEMOGLOBIN A1C: HEMOGLOBIN A1C: 9.6 % — AB (ref 4.6–6.5)

## 2015-10-16 ENCOUNTER — Ambulatory Visit: Payer: BLUE CROSS/BLUE SHIELD | Admitting: Internal Medicine

## 2015-10-21 MED ORDER — GLIPIZIDE 10 MG PO TABS: 10.0000 mg | ORAL_TABLET | Freq: Two times a day (BID) | ORAL | 3 refills | Status: DC

## 2015-10-21 NOTE — Addendum Note (Signed)
Addended by: Roena Malady on: 10/21/2015 01:15 PM   Modules accepted: Orders

## 2016-01-04 ENCOUNTER — Ambulatory Visit: Payer: BLUE CROSS/BLUE SHIELD | Admitting: Internal Medicine

## 2016-01-14 ENCOUNTER — Emergency Department
Admission: EM | Admit: 2016-01-14 | Discharge: 2016-01-14 | Disposition: A | Discharge status: Home / Self Care | Payer: BLUE CROSS/BLUE SHIELD | Attending: Emergency Medicine | Admitting: Emergency Medicine

## 2016-01-14 ENCOUNTER — Encounter: Payer: Self-pay | Admitting: Emergency Medicine

## 2016-01-14 DIAGNOSIS — Z7982 Long term (current) use of aspirin: Secondary | ICD-10-CM | POA: Diagnosis not present

## 2016-01-14 DIAGNOSIS — Z7984 Long term (current) use of oral hypoglycemic drugs: Secondary | ICD-10-CM | POA: Diagnosis not present

## 2016-01-14 DIAGNOSIS — Z9861 Coronary angioplasty status: Secondary | ICD-10-CM | POA: Insufficient documentation

## 2016-01-14 DIAGNOSIS — I2510 Atherosclerotic heart disease of native coronary artery without angina pectoris: Secondary | ICD-10-CM | POA: Insufficient documentation

## 2016-01-14 DIAGNOSIS — F17210 Nicotine dependence, cigarettes, uncomplicated: Secondary | ICD-10-CM | POA: Insufficient documentation

## 2016-01-14 DIAGNOSIS — E119 Type 2 diabetes mellitus without complications: Secondary | ICD-10-CM | POA: Diagnosis not present

## 2016-01-14 DIAGNOSIS — Z79899 Other long term (current) drug therapy: Secondary | ICD-10-CM | POA: Diagnosis not present

## 2016-01-14 DIAGNOSIS — I10 Essential (primary) hypertension: Secondary | ICD-10-CM | POA: Diagnosis not present

## 2016-01-14 DIAGNOSIS — R040 Epistaxis: Secondary | ICD-10-CM | POA: Diagnosis not present

## 2016-01-14 MED ORDER — OXYMETAZOLINE HCL 0.05 % NA SOLN
2.0000 | Freq: Once | NASAL | Status: AC
  Administered 2016-01-14: 2 via NASAL

## 2016-01-14 MED ORDER — OXYMETAZOLINE HCL 0.05 % NA SOLN: 1.0000 | Freq: Once | NASAL | Status: DC

## 2016-01-14 MED ORDER — OXYMETAZOLINE HCL 0.05 % NA SOLN
NASAL | Status: AC
  Filled 2016-01-14: qty 15

## 2016-01-14 NOTE — ED Provider Notes (Signed)
[facility] Emergency Department Provider Note   First MD Initiated Contact with Patient 01/14/16 0217     (approximate)  I have reviewed the triage vital signs and the nursing notes.   HISTORY  Chief Complaint Epistaxis    HPI Martin Mullen is a 35 y.o. male with below list of chronic medical conditions presents to the emergency department with epistaxis. Patient states approximately 45 minutes before presentation his nose began to bleed again. Patient states he's had 4 episodes of nosebleeds today and admits to previous nosebleeds since childhood. Patient actively bleeding at this time.   Past Medical History:  Diagnosis Date  . Acute transmural inferior wall MI ([place]) 08/28/2012   .5 x 12 mm Veri-flex stent non-DES.  Marland Kitchen Chicken pox   . Coronary artery disease    Inferior ST elevation myocardial infarction in July of 2014. Cardiac catheterization showed 95% distal RCA stenosis and 90% first diagonal stenosis with normal ejection fraction. He underwent PCI in bare-metal stent placement to the distal RCA.  . Diabetes mellitus without complication ([place])   . Hypercholesterolemia   . Tobacco use     Patient Active Problem List   Diagnosis Date Noted  . Coronary artery disease   . Obesity (BMI 30-39.9) 07/09/2013  . DM type 2, uncontrolled, with neuropathy ([place]) 07/09/2013  . STEMI s/p RCA stent 08/2012 09/05/2012  . HTN (hypertension) 09/05/2012  . HLD (hyperlipidemia) 09/05/2012  . Nicotine dependence 09/05/2012  . Adjustment disorder with mixed anxiety and depressed mood 09/05/2012    Past Surgical History:  Procedure Laterality Date  . CORONARY ANGIOPLASTY WITH STENT PLACEMENT    . LEFT HEART CATHETERIZATION WITH CORONARY ANGIOGRAM N/A 08/28/2012   Procedure: LEFT HEART CATHETERIZATION WITH CORONARY ANGIOGRAM;  Surgeon: Laverda Page, MD;  Location: [facility] CATH LAB;  Service: Cardiovascular;  Laterality: N/A;    Prior to Admission medications     Medication Sig Start Date End Date Taking? Authorizing Provider  aspirin 81 MG EC tablet Take 1 tablet (81 mg total) by mouth daily. 09/05/12   Ripudeep Krystal Eaton, MD  Blood Glucose Monitoring Suppl (BAYER CONTOUR NEXT MONITOR) w/Device KIT 1 Device 07/22/15   [name], NP  citalopram (CELEXA) 40 MG tablet Take 1 tablet (40 mg total) by mouth daily. 10/13/15   [name], NP  clopidogrel (PLAVIX) 75 MG tablet TAKE ONE TABLET BY MOUTH ONCE DAILY 07/16/15   [name], NP  glipiZIDE (GLUCOTROL) 10 MG tablet Take 1 tablet (10 mg total) by mouth 2 (two) times daily before a meal. 10/21/15   [name], NP  glucose blood (BAYER CONTOUR NEXT TEST) test strip 1 each by Other route 3 (three) times daily as needed for other. Use as instructed 07/22/15   [name], NP  metFORMIN (GLUCOPHAGE) 1000 MG tablet TAKE ONE TABLET BY MOUTH TWICE DAILY WITH MEALS 07/16/15   [name], NP  metoprolol tartrate (LOPRESSOR) 25 MG tablet Take 1 tablet (25 mg total) by mouth 2 (two) times daily. 07/16/15   [name], NP  Multiple Vitamins-Minerals (MULTIVITAMIN PO) Take 1 capsule by mouth daily.    Historical Provider, MD  nitroGLYCERIN (NITROSTAT) 0.4 MG SL tablet Place 1 tablet (0.4 mg total) under the tongue every 5 (five) minutes x 3 doses as needed for chest pain. 11/21/13   [name], NP  Omega-3 Fatty Acids (FISH OIL) 1200 MG CAPS Take 1,200 mg by mouth 3 (three) times daily.  Historical Provider, MD  pravastatin (PRAVACHOL) 40 MG tablet TAKE TWO TABLETS BY MOUTH IN THE EVENING AFTER  MEALS 10/09/15   [name], NP    Allergies Patient has no known allergies.  Family History  Problem Relation Age of Onset  . Arthritis Mother     Rheumatoid  . Diabetes Mother   . Hyperlipidemia Mother   . Diabetes Father   . Cancer Maternal Grandfather     Prostate  . Parkinson's disease Maternal Grandfather     Social History Social History  Substance Use Topics  . Smoking status: Current  Every Day Smoker    Packs/day: 1.00    Years: 20.00    Types: Cigarettes  . Smokeless tobacco: Never Used  . Alcohol use 0.0 oz/week     Comment: occasional    Review of Systems Constitutional: No fever/chills Eyes: No visual changes. ENT: No sore throat.Positive for nosebleed Cardiovascular: Denies chest pain. Respiratory: Denies shortness of breath. Gastrointestinal: No abdominal pain.  No nausea, no vomiting.  No diarrhea.  No constipation. Genitourinary: Negative for dysuria. Musculoskeletal: Negative for back pain. Skin: Negative for rash. Neurological: Negative for headaches, focal weakness or numbness.  10-point ROS otherwise negative.  ____________________________________________   PHYSICAL EXAM:  VITAL SIGNS: ED Triage Vitals  Enc Vitals Group     BP 01/14/16 0202 (!) 129/91     Pulse Rate 01/14/16 0202 (!) 118     Resp 01/14/16 0202 18     Temp 01/14/16 0202 97.9 F (36.6 C)     Temp Source 01/14/16 0202 Oral     SpO2 01/14/16 0202 97 %     Weight 01/14/16 0200 245 lb (111.1 kg)     Height 01/14/16 0200 5' 10"  (1.778 m)     Head Circumference --      Peak Flow --      Pain Score --      Pain Loc --      Pain Edu? --      Excl. in [place]? --     Constitutional: Alert and oriented. Well appearing and in no acute distress. Eyes: Conjunctivae are normal. PERRL. EOMI. Head: Atraumatic. Ears:  Healthy appearing ear canals and TMs bilaterally Nose: No congestion/rhinnorhea. Mouth/Throat: Mucous membranes are moist.  Oropharynx non-erythematous. Neck: No stridor.  No meningeal signs.   Cardiovascular: Normal rate, regular rhythm. Good peripheral circulation. Grossly normal heart sounds. Respiratory: Normal respiratory effort.  No retractions. Lungs CTAB. Gastrointestinal: Soft and nontender. No distention.  Musculoskeletal: No lower extremity tenderness nor edema. No gross deformities of extremities. Neurologic:  Normal speech and language. No gross focal  neurologic deficits are appreciated.  Skin:  Skin is warm, dry and intact. No rash noted. Psychiatric: Mood and affect are normal. Speech and behavior are normal.  _   .Epistaxis Management Date/Time: 01/14/2016 4:29 AM Performed by: Gregor Hams Authorized by: Gregor Hams   Consent:    Consent obtained:  Verbal   Consent given by:  Patient   Risks discussed:  Pain and infection   Alternatives discussed:  Alternative treatment Anesthesia (see MAR for exact dosages):    Anesthesia method:  None Procedure details:    Treatment site:  R anterior and L anterior   Treatment method:  Merocel sponge   Treatment complexity:  Limited   Treatment episode: initial   Post-procedure details:    Assessment:  Bleeding stopped   Patient tolerance of procedure:  Tolerated well, no immediate complications  INITIAL IMPRESSION / ASSESSMENT AND PLAN / ED COURSE  Pertinent labs & imaging results that were available during my care of the patient were reviewed by me and considered in my medical decision making (see chart for details).     Clinical Course     ____________________________________________  FINAL CLINICAL IMPRESSION(S) / ED DIAGNOSES  Final diagnoses:  Epistaxis     MEDICATIONS GIVEN DURING THIS VISIT:  Medications  oxymetazoline (AFRIN) 0.05 % nasal spray 2 spray (2 sprays Each Nare Given 01/14/16 0221)     NEW OUTPATIENT MEDICATIONS STARTED DURING THIS VISIT:  New Prescriptions   No medications on file    Modified Medications   No medications on file    Discontinued Medications   No medications on file     Note:  This document was prepared using Dragon voice recognition software and may include unintentional dictation errors.    Gregor Hams, MD 01/14/16 [phone]

## 2016-01-14 NOTE — ED Triage Notes (Addendum)
Patient ambulatory to triage with steady gait, without difficulty or distress noted; pt reports bilat nosebleed x ; denies any recent illness, denies any accomp symptoms; st 4 episodes today; nose clamp applied

## 2016-01-16 ENCOUNTER — Telehealth: Payer: Self-pay | Admitting: Internal Medicine

## 2016-01-16 NOTE — Telephone Encounter (Signed)
 [facility] Day - Client TELEPHONE ADVICE RECORD [facility] Patient Name: JAMS JUCKETT DOB: 1980/12/11 Initial Comment Caller states that her husband has been having nose bleeds. He has three nosebleeds today. He has been bleeding for about ten minutes. He is coughing up blood as well. He took a blood thinner in the last two days. Nurse Assessment Nurse: Ladona Ridgel RN, Felicia Date/Time (Eastern Time): 01/15/2016 4:44:46 PM Confirm and document reason for call. If symptomatic, describe symptoms. ---PT is coughing up bleed and several nose bleeds and went to ER THurs and they put nasal packing in nose and it is not working - 3 more nose bleeds yesterday and 3 today and now he is spitting up blood. On blood thinners since 3 yrs ago Does the patient have any new or worsening symptoms? ---Sandie Ano a triage be completed? ---Yes Related visit to physician within the last 2 weeks? ---Yes Does the PT have any chronic conditions? (i.e. diabetes, asthma, etc.) ---Yes List chronic conditions. ---DM, heart issue - mi in the past on blood thinner - stints placed Is the patient pregnant or possibly pregnant? (Ask all females between the ages of 9-55) ---No Is this a behavioral health or substance abuse call? ---No Guidelines Guideline Title Affirmed Question Affirmed Notes Coughing Up Blood [1] Coughed up blood AND [2] large amount (example: "a cup of blood") Final Disposition User Call EMS 911 Now Gaddy, RN, FeliciaComments was having trouble logging into epic - created new password and still not able to get in - tried to call office just to let them know but line was busy. Now they are closed ticket no. [phone] for help desk with epic they will call back - unable to access epic at this time - so cant paste record in the ticket for technical assist is still open they were not able to fix yet but will call this nurse back tom. Burna Mortimer RN with the epic  system called back and she had she had recorrected the log in situation. So now am able to admit call record into epic Disagree/Comply: Disagree Disagree/Comply Reason: Disagree with instructionsCall Id: [id number] he's standing now not dizzy - he refused to call 911 - they are going to [facility] ER now

## 2016-01-18 ENCOUNTER — Ambulatory Visit (INDEPENDENT_AMBULATORY_CARE_PROVIDER_SITE_OTHER): Payer: BLUE CROSS/BLUE SHIELD | Admitting: Internal Medicine

## 2016-01-18 ENCOUNTER — Encounter: Payer: Self-pay | Admitting: Internal Medicine

## 2016-01-18 VITALS — BP 120/70 | HR 77 | Temp 97.7°F | Wt 245.0 lb

## 2016-01-18 DIAGNOSIS — R040 Epistaxis: Secondary | ICD-10-CM

## 2016-01-18 DIAGNOSIS — E1165 Type 2 diabetes mellitus with hyperglycemia: Secondary | ICD-10-CM

## 2016-01-18 DIAGNOSIS — Z23 Encounter for immunization: Secondary | ICD-10-CM | POA: Diagnosis not present

## 2016-01-18 LAB — CBC
HCT: 45.5 % (ref 39.0–52.0)
Hemoglobin: 15.6 g/dL (ref 13.0–17.0)
MCHC: 34.3 g/dL (ref 30.0–36.0)
MCV: 95 fl (ref 78.0–100.0)
Platelets: 236 10*3/uL (ref 150.0–400.0)
RBC: 4.79 Mil/uL (ref 4.22–5.81)
RDW: 13.3 % (ref 11.5–15.5)
WBC: 15 10*3/uL — ABNORMAL HIGH (ref 4.0–10.5)

## 2016-01-18 LAB — MICROALBUMIN / CREATININE URINE RATIO
Creatinine,U: 120.7 mg/dL
MICROALB/CREAT RATIO: 2.1 mg/g (ref 0.0–30.0)
Microalb, Ur: 2.5 mg/dL — ABNORMAL HIGH (ref 0.0–1.9)

## 2016-01-18 LAB — HEMOGLOBIN A1C: HEMOGLOBIN A1C: 10.4 % — AB (ref 4.6–6.5)

## 2016-01-18 NOTE — Telephone Encounter (Signed)
noted 

## 2016-01-18 NOTE — Patient Instructions (Signed)
Carbohydrate Counting for Diabetes Mellitus, Adult Carbohydrate counting is a method for keeping track of how many carbohydrates you eat. Eating carbohydrates naturally increases the amount of sugar (glucose) in the blood. Counting how many carbohydrates you eat helps keep your blood glucose within normal limits, which helps you manage your diabetes (diabetes mellitus). It is important to know how many carbohydrates you can safely have in each meal. This is different for every person. A diet and nutrition specialist (registered dietitian) can help you make a meal plan and calculate how many carbohydrates you should have at each meal and snack. Carbohydrates are found in the following foods:  Grains, such as breads and cereals.  Dried beans and soy products.  Starchy vegetables, such as potatoes, peas, and corn.  Fruit and fruit juices.  Milk and yogurt.  Sweets and snack foods, such as cake, cookies, candy, chips, and soft drinks. How do I count carbohydrates? There are two ways to count carbohydrates in food. You can use either of the methods or a combination of both. Reading "Nutrition Facts" on packaged food  The "Nutrition Facts" list is included on the labels of almost all packaged foods and beverages in the [place] It includes:  The serving size.  Information about nutrients in each serving, including the grams (g) of carbohydrate per serving. To use the "Nutrition Facts":  Decide how many servings you will have.  Multiply the number of servings by the number of carbohydrates per serving.  The resulting number is the total amount of carbohydrates that you will be having. Learning standard serving sizes of other foods  When you eat foods containing carbohydrates that are not packaged or do not include "Nutrition Facts" on the label, you need to measure the servings in order to count the amount of carbohydrates:  Measure the foods that you will eat with a food scale or measuring  cup, if needed.  Decide how many standard-size servings you will eat.  Multiply the number of servings by 15. Most carbohydrate-rich foods have about 15 g of carbohydrates per serving.  For example, if you eat 8 oz (170 g) of strawberries, you will have eaten 2 servings and 30 g of carbohydrates (2 servings x 15 g = 30 g).  For foods that have more than one food mixed, such as soups and casseroles, you must count the carbohydrates in each food that is included. The following list contains standard serving sizes of common carbohydrate-rich foods. Each of these servings has about 15 g of carbohydrates:   hamburger bun or  English muffin.   oz (15 mL) syrup.   oz (14 g) jelly.  1 slice of bread.  1 six-inch tortilla.  3 oz (85 g) cooked rice or pasta.  4 oz (113 g) cooked dried beans.  4 oz (113 g) starchy vegetable, such as peas, corn, or potatoes.  4 oz (113 g) hot cereal.  4 oz (113 g) mashed potatoes or  of a large baked potato.  4 oz (113 g) canned or frozen fruit.  4 oz (120 mL) fruit juice.  4-6 crackers.  6 chicken nuggets.  6 oz (170 g) unsweetened dry cereal.  6 oz (170 g) plain fat-free yogurt or yogurt sweetened with artificial sweeteners.  8 oz (240 mL) milk.  8 oz (170 g) fresh fruit or one small piece of fruit.  24 oz (680 g) popped popcorn. Example of carbohydrate counting Sample meal  3 oz (85 g) chicken breast.  6 oz (  170 g) brown rice.  4 oz (113 g) corn.  8 oz (240 mL) milk.  8 oz (170 g) strawberries with sugar-free whipped topping. Carbohydrate calculation 1. Identify the foods that contain carbohydrates:  Rice.  Corn.  Milk.  Strawberries. 2. Calculate how many servings you have of each food:  2 servings rice.  1 serving corn.  1 serving milk.  1 serving strawberries. 3. Multiply each number of servings by 15 g:  2 servings rice x 15 g = 30 g.  1 serving corn x 15 g = 15 g.  1 serving milk x 15 g = 15  g.  1 serving strawberries x 15 g = 15 g. 4. Add together all of the amounts to find the total grams of carbohydrates eaten:  30 g + 15 g + 15 g + 15 g = 75 g of carbohydrates total. This information is not intended to replace advice given to you by your health care provider. Make sure you discuss any questions you have with your health care provider. Document Released: 01/31/2005 Document Revised: 08/21/2015 Document Reviewed: 07/15/2015 Elsevier Interactive Patient Education  2017 [employer].  

## 2016-01-18 NOTE — Addendum Note (Signed)
Addended by: Roena Malady on: 01/18/2016 04:56 PM   Modules accepted: Orders

## 2016-01-18 NOTE — Telephone Encounter (Signed)
Forwarding team health note from the weekend that resulted in return to Er.  Patient has appointment with [name], NP at 1:00PM today for further follow up.

## 2016-01-18 NOTE — Progress Notes (Signed)
Subjective:    Patient ID: Martin Mullen, male    DOB: [birth year], 35 y.o.   MRN: [id number]  HPI  Pt presents to the clinic today for hospital follow up for epistaxis. He reports he went to the ER 11/30, after having multiple recurrent nosebleeds in a 24 hour period. He is on ASA and Plavix daily. No labs were performed. They were able to control the bleeding in the ER so he discharged and advised to follow up with his PCP.  He is also due to follow up DM2. His last A1C was 9.6%, 09/2015. At that time, his Glyburide was stopped and he was switched to Glipizide 10 mg BID. He also takes Metformin 1000 mg BID. He checks his sugars intermittently. But they range 160's - 356. He lost 5 lbs since his last visit. His last eye exam was > 2 years ago. His flu shot was 02/2015 and pneumonia vaccine are UTD. He denies blurred vision, increased thirst or urinary frequency. He does have numbness and tingling in his hands and feet.  Review of Systems      Past Medical History:  Diagnosis Date  . Acute transmural inferior wall MI ([place]) 08/28/2012   .5 x 12 mm Veri-flex stent non-DES.  Marland Kitchen Chicken pox   . Coronary artery disease    Inferior ST elevation myocardial infarction in July of 2014. Cardiac catheterization showed 95% distal RCA stenosis and 90% first diagonal stenosis with normal ejection fraction. He underwent PCI in bare-metal stent placement to the distal RCA.  . Diabetes mellitus without complication ([place])   . Hypercholesterolemia   . Tobacco use     Current Outpatient Prescriptions  Medication Sig Dispense Refill  . aspirin 81 MG EC tablet Take 1 tablet (81 mg total) by mouth daily. 30 tablet 4  . Blood Glucose Monitoring Suppl (BAYER CONTOUR NEXT MONITOR) w/Device KIT 1 Device 1 kit 0  . citalopram (CELEXA) 40 MG tablet Take 1 tablet (40 mg total) by mouth daily. 30 tablet 3  . clopidogrel (PLAVIX) 75 MG tablet TAKE ONE TABLET BY MOUTH ONCE DAILY 30 tablet 2  . glipiZIDE (GLUCOTROL) 10  MG tablet Take 1 tablet (10 mg total) by mouth 2 (two) times daily before a meal. 60 tablet 3  . glucose blood (BAYER CONTOUR NEXT TEST) test strip 1 each by Other route 3 (three) times daily as needed for other. Use as instructed 100 each 12  . metFORMIN (GLUCOPHAGE) 1000 MG tablet TAKE ONE TABLET BY MOUTH TWICE DAILY WITH MEALS 60 tablet 2  . metoprolol tartrate (LOPRESSOR) 25 MG tablet Take 1 tablet (25 mg total) by mouth 2 (two) times daily. 60 tablet 2  . Multiple Vitamins-Minerals (MULTIVITAMIN PO) Take 1 capsule by mouth daily.    . nitroGLYCERIN (NITROSTAT) 0.4 MG SL tablet Place 1 tablet (0.4 mg total) under the tongue every 5 (five) minutes x 3 doses as needed for chest pain. 30 tablet 12  . Omega-3 Fatty Acids (FISH OIL) 1200 MG CAPS Take 1,200 mg by mouth 3 (three) times daily.    . pravastatin (PRAVACHOL) 40 MG tablet TAKE TWO TABLETS BY MOUTH IN THE EVENING AFTER  MEALS 60 tablet 2   No current facility-administered medications for this visit.     No Known Allergies  Family History  Problem Relation Age of Onset  . Arthritis Mother     Rheumatoid  . Diabetes Mother   . Hyperlipidemia Mother   . Diabetes Father   .  Cancer Maternal Grandfather     Prostate  . Parkinson's disease Maternal Grandfather     Social History   Social History  . Marital status: Married    Spouse name: N/A  . Number of children: N/A  . Years of education: N/A   Occupational History  . Not on file.   Social History Main Topics  . Smoking status: Current Every Day Smoker    Packs/day: 1.00    Years: 20.00    Types: Cigarettes  . Smokeless tobacco: Never Used  . Alcohol use 0.0 oz/week     Comment: occasional  . Drug use:     Types: Cocaine  . Sexual activity: Yes    Birth control/ protection: None   Other Topics Concern  . Not on file   Social History Narrative  . No narrative on file     Constitutional: Denies fever, malaise, fatigue, headache or abrupt weight changes.    HEENT: Pt reports bloody nose. Denies eye pain, eye redness, ear pain, ringing in the ears, wax buildup, runny nose, nasal congestion, or sore throat. Respiratory: Denies difficulty breathing, shortness of breath, cough or sputum production.   Cardiovascular: Denies chest pain, chest tightness, palpitations or swelling in the hands or feet.  Gastrointestinal: Denies abdominal pain, bloating, constipation, diarrhea or blood in the stool.  GU: Denies urgency, frequency, pain with urination, burning sensation, blood in urine, odor or discharge.  Neurological: Pt reports numbness and tingling in his hands and feet. Denies dizziness, difficulty with memory, difficulty with speech or problems with balance and coordination.    No other specific complaints in a complete review of systems (except as listed in HPI above).  Objective:   Physical Exam  BP 120/70   Pulse 77   Temp 97.7 F (36.5 C) (Oral)   Wt 245 lb (111.1 kg)   SpO2 98%   BMI 35.15 kg/m  Wt Readings from Last 3 Encounters:  01/18/16 245 lb (111.1 kg)  01/14/16 245 lb (111.1 kg)  10/13/15 250 lb 12 oz (113.7 kg)    General: Appearshis stated age, obese in NAD. HEENT: Nose: dry crusted blood noted in right nasal cavity;  Cardiovascular: Normal rate and rhythm. No JVD or BLE edema. No carotid bruits noted. Pulmonary/Chest: Normal effort and positive vesicular breath sounds. No respiratory distress. No wheezes, rales or ronchi noted.  Neurological: Alert and oriented. Sensation decreased in BLE.   BMET    Component Value Date/Time   NA 133 (L) 07/16/2015 1546   NA 139 01/29/2013 0040   K 4.7 07/16/2015 1546   K 4.0 01/29/2013 0040   CL 97 07/16/2015 1546   CL 107 01/29/2013 0040   CO2 27 07/16/2015 1546   CO2 28 01/29/2013 0040   GLUCOSE 502 (HH) 07/16/2015 1546   GLUCOSE 228 (H) 01/29/2013 0040   BUN 12 07/16/2015 1546   BUN 15 01/29/2013 0040   CREATININE 1.05 07/16/2015 1546   CREATININE 0.88 01/29/2013 0040    CALCIUM 9.5 07/16/2015 1546   CALCIUM 9.2 01/29/2013 0040   GFRNONAA >60 03/06/2015 0641   GFRNONAA >60 01/29/2013 0040   GFRAA >60 03/06/2015 0641   GFRAA >60 01/29/2013 0040    Lipid Panel     Component Value Date/Time   CHOL 118 10/13/2015 1527   TRIG 111.0 10/13/2015 1527   HDL 33.10 (L) 10/13/2015 1527   CHOLHDL 4 10/13/2015 1527   VLDL 22.2 10/13/2015 1527   LDLCALC 63 10/13/2015 1527    CBC  Component Value Date/Time   WBC 14.0 (H) 07/16/2015 1546   RBC 5.43 07/16/2015 1546   HGB 17.7 (H) 07/16/2015 1546   HGB 16.4 01/29/2013 0040   HCT 51.3 07/16/2015 1546   HCT 48.2 01/29/2013 0040   PLT 229.0 07/16/2015 1546   PLT 222 01/29/2013 0040   MCV 94.4 07/16/2015 1546   MCV 93 01/29/2013 0040   MCH 31.7 03/06/2015 0641   MCHC 34.4 07/16/2015 1546   RDW 13.6 07/16/2015 1546   RDW 13.8 01/29/2013 0040    Hgb A1C Lab Results  Component Value Date   HGBA1C 9.6 (H) 10/13/2015        Assessment & Plan:   ER follow up for epistaxsis:  ER notes reviewed Seems to be controlled today Hold ASA- call cardiology and ask them if you can stop this Continue Plavix CBC today  DM 2, uncontrolled  A1C and microalbumin today Encouraged him to consume a low carb diet and exercise to lose weight Flu shot today Pneumovax UTD Offered referral to opthomology but he declines- will not have vision insurance until after Feb 15 2016 Foot exam today  RTC in 3 months for follow up DM 2 [name], REGINA, NP

## 2016-01-22 ENCOUNTER — Encounter: Payer: Self-pay | Admitting: Cardiovascular Disease

## 2016-01-22 ENCOUNTER — Ambulatory Visit (INDEPENDENT_AMBULATORY_CARE_PROVIDER_SITE_OTHER): Payer: BLUE CROSS/BLUE SHIELD | Admitting: Cardiovascular Disease

## 2016-01-22 VITALS — BP 100/64 | HR 82 | Ht 70.0 in | Wt 247.2 lb

## 2016-01-22 DIAGNOSIS — I2510 Atherosclerotic heart disease of native coronary artery without angina pectoris: Secondary | ICD-10-CM

## 2016-01-22 DIAGNOSIS — E7800 Pure hypercholesterolemia, unspecified: Secondary | ICD-10-CM | POA: Diagnosis not present

## 2016-01-22 DIAGNOSIS — Z720 Tobacco use: Secondary | ICD-10-CM | POA: Diagnosis not present

## 2016-01-22 DIAGNOSIS — I10 Essential (primary) hypertension: Secondary | ICD-10-CM | POA: Diagnosis not present

## 2016-01-22 MED ORDER — SITAGLIPTIN PHOSPHATE 100 MG PO TABS: 100.0000 mg | ORAL_TABLET | Freq: Every day | ORAL | 3 refills | Status: DC

## 2016-01-22 MED ORDER — LISINOPRIL 2.5 MG PO TABS: 2.5000 mg | ORAL_TABLET | Freq: Every day | ORAL | 3 refills | Status: DC

## 2016-01-22 NOTE — Progress Notes (Signed)
Cardiology Office Note   Date:  01/22/2016   ID:  Martin Mullen, DOB 10-Apr-1980, MRN [id number]  PCP:  Webb Silversmith, NP  Cardiologist:   Kathlyn Sacramento, MD   Chief Complaint  Patient presents with  . OTHER    OD f/u c/o nose bleeds discuss blood thinners. Meds reviewed verbally with pt.      History of Present Illness: Martin Mullen is a 35 y.o. male who presents for A follow-up visit regarding coronary artery disease. He has not been seen by me since 2015. He presented in July 2014 with inferior ST elevation myocardial infarction. He was treated by Dr. Einar Gip. Cardiac catheterization showed 95% distal RCA stenosis with normal ejection fraction. He underwent successful angioplasty and bare-metal stent placement without complications. He was diagnosed with diabetes at that time. He also has hyperlipidemia and prolonged history of tobacco use. He did have previous history of cocaine and marijuana use before his myocardial infarction. He quit recreational drug use but unfortunately he continues to smoke. His diabetes is also not controlled. He had a treadmill stress test done in 2015 which was normal. He has been dealing with recurrent episodes of epistaxis which required emergency room visit. The patient has an appointment with ENT. He has been stable from a cardiac standpoint with no reported chest pain or shortness of breath.  Past Medical History:  Diagnosis Date  . Acute transmural inferior wall MI ([place]) 08/28/2012   .5 x 12 mm Veri-flex stent non-DES.  Marland Kitchen Chicken pox   . Coronary artery disease    Inferior ST elevation myocardial infarction in July of 2014. Cardiac catheterization showed 95% distal RCA stenosis and 90% first diagonal stenosis with normal ejection fraction. He underwent PCI in bare-metal stent placement to the distal RCA.  . Diabetes mellitus without complication ([place])   . Hypercholesterolemia   . Tobacco use     Past Surgical History:  Procedure Laterality  Date  . CORONARY ANGIOPLASTY WITH STENT PLACEMENT    . LEFT HEART CATHETERIZATION WITH CORONARY ANGIOGRAM N/A 08/28/2012   Procedure: LEFT HEART CATHETERIZATION WITH CORONARY ANGIOGRAM;  Surgeon: Laverda Page, MD;  Location[facility] CATH LAB;  Service: Cardiovascular;  Laterality: N/A;     Current Outpatient Prescriptions  Medication Sig Dispense Refill  . aspirin 81 MG EC tablet Take 1 tablet (81 mg total) by mouth daily. 30 tablet 4  . Blood Glucose Monitoring Suppl (BAYER CONTOUR NEXT MONITOR) w/Device KIT 1 Device 1 kit 0  . citalopram (CELEXA) 40 MG tablet Take 1 tablet (40 mg total) by mouth daily. 30 tablet 3  . clopidogrel (PLAVIX) 75 MG tablet TAKE ONE TABLET BY MOUTH ONCE DAILY 30 tablet 2  . glipiZIDE (GLUCOTROL) 10 MG tablet Take 1 tablet (10 mg total) by mouth 2 (two) times daily before a meal. 60 tablet 3  . glucose blood (BAYER CONTOUR NEXT TEST) test strip 1 each by Other route 3 (three) times daily as needed for other. Use as instructed 100 each 12  . metFORMIN (GLUCOPHAGE) 1000 MG tablet TAKE ONE TABLET BY MOUTH TWICE DAILY WITH MEALS 60 tablet 2  . metoprolol tartrate (LOPRESSOR) 25 MG tablet Take 1 tablet (25 mg total) by mouth 2 (two) times daily. 60 tablet 2  . Multiple Vitamins-Minerals (MULTIVITAMIN PO) Take 1 capsule by mouth daily.    . nitroGLYCERIN (NITROSTAT) 0.4 MG SL tablet Place 1 tablet (0.4 mg total) under the tongue every 5 (five) minutes x 3 doses as needed for  chest pain. 30 tablet 12  . Omega-3 Fatty Acids (FISH OIL) 1200 MG CAPS Take 1,200 mg by mouth 3 (three) times daily.    . pravastatin (PRAVACHOL) 40 MG tablet TAKE TWO TABLETS BY MOUTH IN THE EVENING AFTER  MEALS 60 tablet 2  . lisinopril (PRINIVIL,ZESTRIL) 2.5 MG tablet Take 1 tablet (2.5 mg total) by mouth daily. (Patient not taking: Reported on 01/22/2016) 30 tablet 3  . sitaGLIPtin (JANUVIA) 100 MG tablet Take 1 tablet (100 mg total) by mouth daily. (Patient not taking: Reported on 01/22/2016) 30  tablet 3   No current facility-administered medications for this visit.     Allergies:   Patient has no known allergies.    Social History:  The patient  reports that he has been smoking Cigarettes.  He has a 20.00 pack-year smoking history. He has never used smokeless tobacco. He reports that he drinks alcohol. He reports that he does not use drugs.   Family History:  The patient's family history includes Arthritis in his mother; Cancer in his maternal grandfather; Diabetes in his father and mother; Hyperlipidemia in his mother; Parkinson's disease in his maternal grandfather.    ROS:  Please see the history of present illness.   Otherwise, review of systems are positive for none.   All other systems are reviewed and negative.    PHYSICAL EXAM: VS:  BP 100/64 (BP Location: Left Arm, Patient Position: Sitting, Cuff Size: Large)   Pulse 82   Ht 5' 10"  (1.778 m)   Wt 247 lb 4 oz (112.2 kg)   BMI 35.48 kg/m  , BMI Body mass index is 35.48 kg/m. GEN: Well nourished, well developed, in no acute distress  HEENT: normal  Neck: no JVD, carotid bruits, or masses Cardiac: RRR; no murmurs, rubs, or gallops,no edema  Respiratory:  clear to auscultation bilaterally, normal work of breathing GI: soft, nontender, nondistended, + BS MS: no deformity or atrophy  Skin: warm and dry, no rash Neuro:  Strength and sensation are intact Psych: euthymic mood, full affect   EKG:  EKG is ordered today. The ekg ordered today demonstrates normal sinus rhythm with no significant ST or T wave changes.   Recent Labs: 07/16/2015: ALT 21; BUN 12; Creatinine, Ser 1.05; Potassium 4.7; Sodium 133 01/18/2016: Hemoglobin 15.6; Platelets 236.0    Lipid Panel    Component Value Date/Time   CHOL 118 10/13/2015 1527   TRIG 111.0 10/13/2015 1527   HDL 33.10 (L) 10/13/2015 1527   CHOLHDL 4 10/13/2015 1527   VLDL 22.2 10/13/2015 1527   LDLCALC 63 10/13/2015 1527   LDLDIRECT 119.0 07/16/2015 1546      Wt  Readings from Last 3 Encounters:  01/22/16 247 lb 4 oz (112.2 kg)  01/18/16 245 lb (111.1 kg)  01/14/16 245 lb (111.1 kg)      No flowsheet data found.    ASSESSMENT AND PLAN:  1.  Coronary artery disease involving native coronary arteries without angina: Has been more than 3 years since his myocardial infarction and stent placement. Thus, I recommend discontinuing treatment with Plavix. Continue low-dose aspirin 81 mg once daily indefinitely. I had a prolonged discussion with him about the importance of healthy lifestyle changes in controlling his risk factors.  2. Essential hypertension: Blood pressure is controlled on current medications.  3. Hyperlipidemia: Continue treatment with pravastatin. Most recent lipid profile in August showed an LDL of 63.  4. Tobacco use: I discussed with the patient the importance of smoking cessation.  Disposition:   FU with me in 1 year  Signed,  Kathlyn Sacramento, MD  01/22/2016 4:25 PM    [place]

## 2016-01-22 NOTE — Addendum Note (Signed)
Addended by: Roena Malady on: 01/22/2016 09:55 AM   Modules accepted: Orders

## 2016-01-22 NOTE — Patient Instructions (Signed)
Medication Instructions:  Your physician has recommended you make the following change in your medication:  STOP taking plavix   Labwork: none  Testing/Procedures: none  Follow-Up: Your physician wants you to follow-up in: one year with Dr. Arida.  You will receive a reminder letter in the mail two months in advance. If you don't receive a letter, please call our office to schedule the follow-up appointment.   Any Other Special Instructions Will Be Listed Below (If Applicable).     If you need a refill on your cardiac medications before your next appointment, please call your pharmacy.   

## 2016-01-24 DIAGNOSIS — I2510 Atherosclerotic heart disease of native coronary artery without angina pectoris: Secondary | ICD-10-CM | POA: Diagnosis not present

## 2016-01-24 DIAGNOSIS — Z7902 Long term (current) use of antithrombotics/antiplatelets: Secondary | ICD-10-CM | POA: Diagnosis not present

## 2016-01-24 DIAGNOSIS — F17210 Nicotine dependence, cigarettes, uncomplicated: Secondary | ICD-10-CM | POA: Diagnosis not present

## 2016-01-24 DIAGNOSIS — I252 Old myocardial infarction: Secondary | ICD-10-CM | POA: Diagnosis not present

## 2016-01-24 DIAGNOSIS — R040 Epistaxis: Secondary | ICD-10-CM | POA: Diagnosis not present

## 2016-01-24 DIAGNOSIS — E119 Type 2 diabetes mellitus without complications: Secondary | ICD-10-CM | POA: Diagnosis not present

## 2016-01-25 ENCOUNTER — Telehealth: Payer: Self-pay

## 2016-01-25 ENCOUNTER — Other Ambulatory Visit: Payer: Self-pay | Admitting: Internal Medicine

## 2016-01-25 DIAGNOSIS — R454 Irritability and anger: Secondary | ICD-10-CM

## 2016-01-25 DIAGNOSIS — I25810 Atherosclerosis of coronary artery bypass graft(s) without angina pectoris: Secondary | ICD-10-CM

## 2016-01-25 NOTE — Telephone Encounter (Signed)
Patient's wife notified as instructed by telephone and verbalized understanding. (DPR)

## 2016-01-25 NOTE — Telephone Encounter (Signed)
He has chronically had elevated WBC, likely due to the fact that he smokes. Why doesn't he try to quit smoking and then we can recheck his blood counts.

## 2016-01-25 NOTE — Telephone Encounter (Signed)
Left message on voice mail  to call back

## 2016-01-25 NOTE — Telephone Encounter (Signed)
Mrs Lamanna signed) said pt having nose bleeds on and off; seen at [facility] on 01/18/16; pt was seen at [facility] ED due to nose bleed and pt was concerned because WBC was elevated at [facility] (not sure of WBC reading). Pt is concerned he has cancer with elevated WBC. (01/18/16 WBC was 15.0) pt not having fever.Mrs Geeslin request cb.

## 2016-01-26 MED ORDER — CITALOPRAM HYDROBROMIDE 40 MG PO TABS: 40.0000 mg | ORAL_TABLET | Freq: Every day | ORAL | 1 refills | Status: DC

## 2016-02-01 DIAGNOSIS — R040 Epistaxis: Secondary | ICD-10-CM | POA: Diagnosis not present

## 2016-04-18 ENCOUNTER — Ambulatory Visit: Payer: BLUE CROSS/BLUE SHIELD | Admitting: Internal Medicine

## 2016-04-18 ENCOUNTER — Telehealth: Payer: Self-pay | Admitting: Internal Medicine

## 2016-04-18 DIAGNOSIS — Z0289 Encounter for other administrative examinations: Secondary | ICD-10-CM

## 2016-04-18 NOTE — Progress Notes (Deleted)
Subjective:    Patient ID: Martin Mullen, male    DOB: Nov 19, 1980, 36 y.o.   MRN: [id number]  HPI  Pt presents to the clinic today for 3 month follow up of DM 2. His last A1C was 10.4%, 01/2016. He is taking Metformin, Glipizide and Januvia as prescribed. Lisinopril was added for renal protection. His sugars range. He checks his feet daily. His last eye exam was. Flu and Pneumovax UTD.  Review of Systems      Past Medical History:  Diagnosis Date  . Acute transmural inferior wall MI ([place]) 08/28/2012   .5 x 12 mm Veri-flex stent non-DES.  Marland Kitchen Chicken pox   . Coronary artery disease    Inferior ST elevation myocardial infarction in July of 2014. Cardiac catheterization showed 95% distal RCA stenosis and 90% first diagonal stenosis with normal ejection fraction. He underwent PCI in bare-metal stent placement to the distal RCA.  . Diabetes mellitus without complication ([place])   . Hypercholesterolemia   . Tobacco use     Current Outpatient Prescriptions  Medication Sig Dispense Refill  . aspirin 81 MG EC tablet Take 1 tablet (81 mg total) by mouth daily. 30 tablet 4  . Blood Glucose Monitoring Suppl (BAYER CONTOUR NEXT MONITOR) w/Device KIT 1 Device 1 kit 0  . citalopram (CELEXA) 40 MG tablet Take 1 tablet (40 mg total) by mouth daily. 90 tablet 1  . glipiZIDE (GLUCOTROL) 10 MG tablet Take 1 tablet (10 mg total) by mouth 2 (two) times daily before a meal. 60 tablet 3  . glucose blood (BAYER CONTOUR NEXT TEST) test strip 1 each by Other route 3 (three) times daily as needed for other. Use as instructed 100 each 12  . lisinopril (PRINIVIL,ZESTRIL) 2.5 MG tablet Take 1 tablet (2.5 mg total) by mouth daily. (Patient not taking: Reported on 01/22/2016) 30 tablet 3  . metFORMIN (GLUCOPHAGE) 1000 MG tablet Take 1 tablet (1,000 mg total) by mouth 2 (two) times daily with a meal. TAKE ONE TABLET BY MOUTH TWICE DAILY WITH MEALS 180 tablet 1  . metoprolol tartrate (LOPRESSOR) 25 MG tablet TAKE ONE  TABLET BY MOUTH TWICE DAILY 180 tablet 1  . Multiple Vitamins-Minerals (MULTIVITAMIN PO) Take 1 capsule by mouth daily.    . nitroGLYCERIN (NITROSTAT) 0.4 MG SL tablet Place 1 tablet (0.4 mg total) under the tongue every 5 (five) minutes x 3 doses as needed for chest pain. 30 tablet 12  . Omega-3 Fatty Acids (FISH OIL) 1200 MG CAPS Take 1,200 mg by mouth 3 (three) times daily.    . pravastatin (PRAVACHOL) 40 MG tablet TAKE TWO TABLETS BY MOUTH IN THE EVENING AFTER  MEALS 60 tablet 2  . sitaGLIPtin (JANUVIA) 100 MG tablet Take 1 tablet (100 mg total) by mouth daily. (Patient not taking: Reported on 01/22/2016) 30 tablet 3   No current facility-administered medications for this visit.     No Known Allergies  Family History  Problem Relation Age of Onset  . Arthritis Mother     Rheumatoid  . Diabetes Mother   . Hyperlipidemia Mother   . Diabetes Father   . Cancer Maternal Grandfather     Prostate  . Parkinson's disease Maternal Grandfather     Social History   Social History  . Marital status: Married    Spouse name: N/A  . Number of children: N/A  . Years of education: N/A   Occupational History  . Not on file.   Social History Main Topics  .  Smoking status: Current Every Day Smoker    Packs/day: 1.00    Years: 20.00    Types: Cigarettes  . Smokeless tobacco: Never Used  . Alcohol use 0.0 oz/week     Comment: occasional  . Drug use: No  . Sexual activity: Yes    Birth control/ protection: None   Other Topics Concern  . Not on file   Social History Narrative  . No narrative on file     Constitutional: Denies fever, malaise, fatigue, headache or abrupt weight changes.  HEENT: Denies eye pain, eye redness, ear pain, ringing in the ears, wax buildup, runny nose, nasal congestion, bloody nose, or sore throat. Respiratory: Denies difficulty breathing, shortness of breath, cough or sputum production.   Cardiovascular: Denies chest pain, chest tightness, palpitations or  swelling in the hands or feet.  Gastrointestinal: Denies abdominal pain, bloating, constipation, diarrhea or blood in the stool.  GU: Denies urgency, frequency, pain with urination, burning sensation, blood in urine, odor or discharge. Musculoskeletal: Denies decrease in range of motion, difficulty with gait, muscle pain or joint pain and swelling.  Skin: Denies redness, rashes, lesions or ulcercations.  Neurological: Denies dizziness, difficulty with memory, difficulty with speech or problems with balance and coordination.  Psych: Denies anxiety, depression, SI/HI.  No other specific complaints in a complete review of systems (except as listed in HPI above).  Objective:   Physical Exam        Assessment & Plan:

## 2016-04-18 NOTE — Telephone Encounter (Signed)
No, he should have called before his appt

## 2016-04-18 NOTE — Telephone Encounter (Signed)
Patient called to let Rene Kocher know he missed his appointment at 1:00 because he had a flat tire.  Can appointment be cancelled?

## 2016-04-19 ENCOUNTER — Ambulatory Visit (INDEPENDENT_AMBULATORY_CARE_PROVIDER_SITE_OTHER): Payer: BLUE CROSS/BLUE SHIELD | Admitting: Internal Medicine

## 2016-04-19 ENCOUNTER — Encounter: Payer: Self-pay | Admitting: Internal Medicine

## 2016-04-19 ENCOUNTER — Telehealth: Payer: Self-pay | Admitting: Internal Medicine

## 2016-04-19 VITALS — BP 110/82 | HR 82 | Temp 97.6°F | Wt 247.0 lb

## 2016-04-19 DIAGNOSIS — E1165 Type 2 diabetes mellitus with hyperglycemia: Secondary | ICD-10-CM

## 2016-04-19 DIAGNOSIS — Z8261 Family history of arthritis: Secondary | ICD-10-CM

## 2016-04-19 NOTE — Progress Notes (Signed)
Subjective:    Patient ID: Martin Mullen, male    DOB: [birth year], 36 y.o.   MRN: [id number]  HPI  Pt presents to the clinic today for 3 month follow up of HTN. At his last visit, his A1C was 10.4%. Januvia was added to his Metformin and Glipizide. His sugars range 200-250, non fasting. He checks his feet weekly. His eye exam is scheduled for Thursday. Flu and pneumovax UTD.  He also request to be tested for rheumatoid arthritis. He intermittently has joint pains in his knees, feet and fingers. He is not having any pain at this time. He takes Aleve as needed with good relieve.  Review of Systems  Past Medical History:  Diagnosis Date  . Acute transmural inferior wall MI ([place]) 08/28/2012   .5 x 12 mm Veri-flex stent non-DES.  Marland Kitchen Chicken pox   . Coronary artery disease    Inferior ST elevation myocardial infarction in July of 2014. Cardiac catheterization showed 95% distal RCA stenosis and 90% first diagonal stenosis with normal ejection fraction. He underwent PCI in bare-metal stent placement to the distal RCA.  . Diabetes mellitus without complication ([place])   . Hypercholesterolemia   . Tobacco use     Current Outpatient Prescriptions  Medication Sig Dispense Refill  . aspirin 81 MG EC tablet Take 1 tablet (81 mg total) by mouth daily. 30 tablet 4  . Blood Glucose Monitoring Suppl (BAYER CONTOUR NEXT MONITOR) w/Device KIT 1 Device 1 kit 0  . citalopram (CELEXA) 40 MG tablet Take 1 tablet (40 mg total) by mouth daily. 90 tablet 1  . glipiZIDE (GLUCOTROL) 10 MG tablet Take 1 tablet (10 mg total) by mouth 2 (two) times daily before a meal. 60 tablet 3  . glucose blood (BAYER CONTOUR NEXT TEST) test strip 1 each by Other route 3 (three) times daily as needed for other. Use as instructed 100 each 12  . lisinopril (PRINIVIL,ZESTRIL) 2.5 MG tablet Take 1 tablet (2.5 mg total) by mouth daily. (Patient not taking: Reported on 01/22/2016) 30 tablet 3  . metFORMIN (GLUCOPHAGE) 1000 MG tablet Take  1 tablet (1,000 mg total) by mouth 2 (two) times daily with a meal. TAKE ONE TABLET BY MOUTH TWICE DAILY WITH MEALS 180 tablet 1  . metoprolol tartrate (LOPRESSOR) 25 MG tablet TAKE ONE TABLET BY MOUTH TWICE DAILY 180 tablet 1  . Multiple Vitamins-Minerals (MULTIVITAMIN PO) Take 1 capsule by mouth daily.    . nitroGLYCERIN (NITROSTAT) 0.4 MG SL tablet Place 1 tablet (0.4 mg total) under the tongue every 5 (five) minutes x 3 doses as needed for chest pain. 30 tablet 12  . Omega-3 Fatty Acids (FISH OIL) 1200 MG CAPS Take 1,200 mg by mouth 3 (three) times daily.    . pravastatin (PRAVACHOL) 40 MG tablet TAKE TWO TABLETS BY MOUTH IN THE EVENING AFTER  MEALS 60 tablet 2  . sitaGLIPtin (JANUVIA) 100 MG tablet Take 1 tablet (100 mg total) by mouth daily. (Patient not taking: Reported on 01/22/2016) 30 tablet 3   No current facility-administered medications for this visit.     No Known Allergies  Family History  Problem Relation Age of Onset  . Arthritis Mother     Rheumatoid  . Diabetes Mother   . Hyperlipidemia Mother   . Diabetes Father   . Cancer Maternal Grandfather     Prostate  . Parkinson's disease Maternal Grandfather     Social History   Social History  . Marital status: Married  Spouse name: N/A  . Number of children: N/A  . Years of education: N/A   Occupational History  . Not on file.   Social History Main Topics  . Smoking status: Current Every Day Smoker    Packs/day: 1.00    Years: 20.00    Types: Cigarettes  . Smokeless tobacco: Never Used  . Alcohol use 0.0 oz/week     Comment: occasional  . Drug use: No  . Sexual activity: Yes    Birth control/ protection: None   Other Topics Concern  . Not on file   Social History Narrative  . No narrative on file     Constitutional: Denies fever, malaise, fatigue, headache or abrupt weight changes.  Musculoskeletal: Pt reports intermittent joint pains. Denies decrease in range of motion, difficulty with gait,  muscle pain or joint swelling.  Skin: Denies redness, rashes, lesions or ulcercations.  Neurological: Denies numbness or tingling in his hands and feet, dizziness, difficulty with memory, difficulty with speech or problems with balance and coordination.    No other specific complaints in a complete review of systems (except as listed in HPI above).     Objective:   Physical Exam  BP 110/82 (BP Location: Left Arm, Patient Position: Sitting, Cuff Size: Large)   Pulse 82   Temp 97.6 F (36.4 C) (Oral)   Wt 247 lb (112 kg)   SpO2 98%   BMI 35.44 kg/m  Wt Readings from Last 3 Encounters:  04/19/16 247 lb (112 kg)  01/22/16 247 lb 4 oz (112.2 kg)  01/18/16 245 lb (111.1 kg)    General: Appears her stated age, obese in NAD. Skin: Warm, dry and intact. No ulcerations noted. Cardiovascular: Normal rate and rhythm.  Pulmonary/Chest: Normal effort and positive vesicular breath sounds. No respiratory distress. No wheezes, rales or ronchi noted.  Musculoskeletal:  No signs of joint swelling. No difficulty with gait.  Neurological: Alert and oriented. Sensation intact to BLE.   BMET    Component Value Date/Time   NA 133 (L) 07/16/2015 1546   NA 139 01/29/2013 0040   K 4.7 07/16/2015 1546   K 4.0 01/29/2013 0040   CL 97 07/16/2015 1546   CL 107 01/29/2013 0040   CO2 27 07/16/2015 1546   CO2 28 01/29/2013 0040   GLUCOSE 502 (HH) 07/16/2015 1546   GLUCOSE 228 (H) 01/29/2013 0040   BUN 12 07/16/2015 1546   BUN 15 01/29/2013 0040   CREATININE 1.05 07/16/2015 1546   CREATININE 0.88 01/29/2013 0040   CALCIUM 9.5 07/16/2015 1546   CALCIUM 9.2 01/29/2013 0040   GFRNONAA >60 03/06/2015 0641   GFRNONAA >60 01/29/2013 0040   GFRAA >60 03/06/2015 0641   GFRAA >60 01/29/2013 0040    Lipid Panel     Component Value Date/Time   CHOL 118 10/13/2015 1527   TRIG 111.0 10/13/2015 1527   HDL 33.10 (L) 10/13/2015 1527   CHOLHDL 4 10/13/2015 1527   VLDL 22.2 10/13/2015 1527   LDLCALC 63  10/13/2015 1527    CBC    Component Value Date/Time   WBC 15.0 (H) 01/18/2016 1350   RBC 4.79 01/18/2016 1350   HGB 15.6 01/18/2016 1350   HGB 16.4 01/29/2013 0040   HCT 45.5 01/18/2016 1350   HCT 48.2 01/29/2013 0040   PLT 236.0 01/18/2016 1350   PLT 222 01/29/2013 0040   MCV 95.0 01/18/2016 1350   MCV 93 01/29/2013 0040   MCH 31.7 03/06/2015 0641   MCHC 34.3 01/18/2016  1350   RDW 13.3 01/18/2016 1350   RDW 13.8 01/29/2013 0040    Hgb A1C Lab Results  Component Value Date   HGBA1C 10.4 (H) 01/18/2016          Assessment & Plan:   Family History of Rheumatoid Arthritis with Intermittent Joint Pains:  Will check ESR, RF and ANA Continue Aleve prn  RTC in 3 months for annual exam Webb Silversmith, NP

## 2016-04-19 NOTE — Patient Instructions (Signed)
Carbohydrate Counting for Diabetes Mellitus, Adult Carbohydrate counting is a method for keeping track of how many carbohydrates you eat. Eating carbohydrates naturally increases the amount of sugar (glucose) in the blood. Counting how many carbohydrates you eat helps keep your blood glucose within normal limits, which helps you manage your diabetes (diabetes mellitus). It is important to know how many carbohydrates you can safely have in each meal. This is different for every person. A diet and nutrition specialist (registered dietitian) can help you make a meal plan and calculate how many carbohydrates you should have at each meal and snack. Carbohydrates are found in the following foods:  Grains, such as breads and cereals.  Dried beans and soy products.  Starchy vegetables, such as potatoes, peas, and corn.  Fruit and fruit juices.  Milk and yogurt.  Sweets and snack foods, such as cake, cookies, candy, chips, and soft drinks. How do I count carbohydrates? There are two ways to count carbohydrates in food. You can use either of the methods or a combination of both. Reading "Nutrition Facts" on packaged food  The "Nutrition Facts" list is included on the labels of almost all packaged foods and beverages in the [place] It includes:  The serving size.  Information about nutrients in each serving, including the grams (g) of carbohydrate per serving. To use the "Nutrition Facts":  Decide how many servings you will have.  Multiply the number of servings by the number of carbohydrates per serving.  The resulting number is the total amount of carbohydrates that you will be having. Learning standard serving sizes of other foods  When you eat foods containing carbohydrates that are not packaged or do not include "Nutrition Facts" on the label, you need to measure the servings in order to count the amount of carbohydrates:  Measure the foods that you will eat with a food scale or measuring  cup, if needed.  Decide how many standard-size servings you will eat.  Multiply the number of servings by 15. Most carbohydrate-rich foods have about 15 g of carbohydrates per serving.  For example, if you eat 8 oz (170 g) of strawberries, you will have eaten 2 servings and 30 g of carbohydrates (2 servings x 15 g = 30 g).  For foods that have more than one food mixed, such as soups and casseroles, you must count the carbohydrates in each food that is included. The following list contains standard serving sizes of common carbohydrate-rich foods. Each of these servings has about 15 g of carbohydrates:   hamburger bun or  English muffin.   oz (15 mL) syrup.   oz (14 g) jelly.  1 slice of bread.  1 six-inch tortilla.  3 oz (85 g) cooked rice or pasta.  4 oz (113 g) cooked dried beans.  4 oz (113 g) starchy vegetable, such as peas, corn, or potatoes.  4 oz (113 g) hot cereal.  4 oz (113 g) mashed potatoes or  of a large baked potato.  4 oz (113 g) canned or frozen fruit.  4 oz (120 mL) fruit juice.  4-6 crackers.  6 chicken nuggets.  6 oz (170 g) unsweetened dry cereal.  6 oz (170 g) plain fat-free yogurt or yogurt sweetened with artificial sweeteners.  8 oz (240 mL) milk.  8 oz (170 g) fresh fruit or one small piece of fruit.  24 oz (680 g) popped popcorn. Example of carbohydrate counting Sample meal  3 oz (85 g) chicken breast.  6 oz (  170 g) brown rice.  4 oz (113 g) corn.  8 oz (240 mL) milk.  8 oz (170 g) strawberries with sugar-free whipped topping. Carbohydrate calculation 1. Identify the foods that contain carbohydrates:  Rice.  Corn.  Milk.  Strawberries. 2. Calculate how many servings you have of each food:  2 servings rice.  1 serving corn.  1 serving milk.  1 serving strawberries. 3. Multiply each number of servings by 15 g:  2 servings rice x 15 g = 30 g.  1 serving corn x 15 g = 15 g.  1 serving milk x 15 g = 15  g.  1 serving strawberries x 15 g = 15 g. 4. Add together all of the amounts to find the total grams of carbohydrates eaten:  30 g + 15 g + 15 g + 15 g = 75 g of carbohydrates total. This information is not intended to replace advice given to you by your health care provider. Make sure you discuss any questions you have with your health care provider. Document Released: 01/31/2005 Document Revised: 08/21/2015 Document Reviewed: 07/15/2015 Elsevier Interactive Patient Education  2017 [employer].  

## 2016-04-19 NOTE — Telephone Encounter (Signed)
Patient did not come in for their appointment today.  Please let me know if patient needs to be contacted immediately for follow up or no follow up needed. Do you want to charge the no show fee?

## 2016-04-19 NOTE — Progress Notes (Signed)
Pre visit review using our clinic review tool, if applicable. No additional management support is needed unless otherwise documented below in the visit note. 

## 2016-04-19 NOTE — Assessment & Plan Note (Signed)
A1C today Encouraged low fat, low carb diet and exercise for weight loss Continue Metformin, Glipizide and Januvia Have your eye doctor send me a copy of his note Continue checking your feet Immunizations UTD

## 2016-04-19 NOTE — Telephone Encounter (Signed)
He is already on the schedule today for follow up and yes charge a no show fee.

## 2016-04-20 LAB — SEDIMENTATION RATE: Sed Rate: 14 mm/hr (ref 0–15)

## 2016-04-20 LAB — HEMOGLOBIN A1C: Hgb A1c MFr Bld: 11.5 % — ABNORMAL HIGH (ref 4.6–6.5)

## 2016-04-20 LAB — ANA: Anti Nuclear Antibody(ANA): NEGATIVE

## 2016-04-20 LAB — RHEUMATOID FACTOR: Rhuematoid fact SerPl-aCnc: <14 IU/mL (ref <14)

## 2016-04-21 ENCOUNTER — Other Ambulatory Visit: Payer: Self-pay | Admitting: Internal Medicine

## 2016-04-21 DIAGNOSIS — E1165 Type 2 diabetes mellitus with hyperglycemia: Secondary | ICD-10-CM

## 2016-04-25 ENCOUNTER — Other Ambulatory Visit: Payer: Self-pay | Admitting: Internal Medicine

## 2016-04-25 MED ORDER — INSULIN DETEMIR 100 UNIT/ML FLEXPEN: 20.0000 [IU] | PEN_INJECTOR | Freq: Every day | SUBCUTANEOUS | 11 refills | Status: DC

## 2016-05-05 MED ORDER — INSULIN PEN NEEDLE 31G X 6 MM MISC: 1.0000 | Freq: Every day | 11 refills | Status: DC

## 2016-05-05 NOTE — Addendum Note (Signed)
Addended by: Roena Malady on: 05/05/2016 02:51 PM   Modules accepted: Orders

## 2016-05-10 ENCOUNTER — Telehealth: Payer: Self-pay

## 2016-05-10 NOTE — Telephone Encounter (Signed)
-----   Message from Roena Malady, [place] sent at 05/05/2016  2:51 PM EDT ----- Regarding: Insulin education Patient still has not picked up insulin but wife states she will pick it up next week. I also sent in pen needles. Can you please call pt's wife to set up best day to come in?  Thanks [employer]

## 2016-05-10 NOTE — Telephone Encounter (Signed)
Left message on VM (okay per DPR) for Martin Mullen or Martin Mullen to call me back and schedule a time for insulin training.  I am available this Thursday, 05/12/16 from 2pm - 4pm for training.  Patient to call back and confirm if this time works for him, otherwise will need to set up next week.

## 2016-05-11 ENCOUNTER — Telehealth: Payer: Self-pay

## 2016-05-11 NOTE — Telephone Encounter (Signed)
Spouse

## 2016-05-11 NOTE — Telephone Encounter (Signed)
Spoke to [employer]. She stated 05/12/16 would not be a good day for them.  Please call so they can set up appointment [phone]

## 2016-05-17 NOTE — Telephone Encounter (Signed)
Spoke with wife, Victorino Dike, on 05/12/16 and offered appointment times for insulin training on Monday 05/16/16 and Thursday am 05/19/16.    Victorino Dike was going to speak with her husband and decide on which time will work best and call me back to schedule.

## 2016-05-20 ENCOUNTER — Telehealth: Payer: Self-pay

## 2016-05-20 NOTE — Telephone Encounter (Signed)
Spoke with patient's wife to remind them of insulin training appointment on Monday 4/9 at 4pm and to bring all insulin supplies.    While on phone, wife states that husband has made several lifestyle changes over the past month.  He is exercising regularly and has significantly changed his diet.    As a result, blood sugars are consistently much lower (daily less than 140's) and he has even had some "60" readings with a scary hypoglycemic event yesterday.  Wife is questioning moving forward with insulin since his levels are much better.    I have asked that they compile a log of his sugars over the past week and keep careful records until he sees me on Monday.  They are to bring all records to appointment for me to review.    If concerns, I will discuss further with provider if medication management changes may be needed.    Please advise if any further recommendations at this point.

## 2016-05-20 NOTE — Telephone Encounter (Signed)
No further recommendations but they also told me he had made lifestyle changes prior to his visit and his A1C ended up being higher than the previous. He can keep a log of sugars for now and update [name].

## 2016-05-20 NOTE — Telephone Encounter (Signed)
Spoke to pts wife and advised. Pt will bring log to upcoming appt on Monday

## 2016-05-20 NOTE — Telephone Encounter (Signed)
Spoke with patient's wife Victorino Dike earlier in the week.  Appt scheduled for 05/23/16 at 4pm for training.

## 2016-05-23 ENCOUNTER — Institutional Professional Consult (permissible substitution): Payer: BLUE CROSS/BLUE SHIELD

## 2016-05-23 NOTE — Telephone Encounter (Signed)
noted 

## 2016-05-23 NOTE — Telephone Encounter (Signed)
Patient's wife calls and speaks with Larita Fife to cancel diabetic training appointment this afternoon with this Clinical research associate.  They have had an emergency arise and call back at a later time to reschedule.

## 2016-05-26 ENCOUNTER — Other Ambulatory Visit: Payer: Self-pay

## 2016-05-26 MED ORDER — GLUCOSE BLOOD VI STRP: 1.0000 | ORAL_STRIP | Freq: Three times a day (TID) | 3 refills | Status: DC | PRN

## 2016-05-26 NOTE — Telephone Encounter (Signed)
pts wife request refill test strips to [facility] [address] due to pt cking BS more often than tid. Pt has appt 07/2016 and seen 04/19/16. Refill done per protocol.Victorino Dike voiced understanding.

## 2016-05-31 ENCOUNTER — Telehealth: Payer: Self-pay

## 2016-05-31 NOTE — Telephone Encounter (Signed)
Noted. Mel- Can you call pt and have them drop off a log of his sugars?

## 2016-05-31 NOTE — Telephone Encounter (Signed)
-----   Message from Kizzie Ide, RN sent at 05/31/2016  1:36 PM EDT ----- Regarding: RE: Insulin education Just letting you know, I did document in his chart, patient still has not come for BS LOG review and insulin education.  His last appointment was cancelled same day due to family emergency and they have yet to reschedule or send me the requested sugar readings so we could determine whether insulin management is needed.    Just FYI.  Thanks, Angelica Chessman ----- Message ----- From: Roena Malady, CMA Sent: 05/05/2016   2:51 PM To: Roena Malady, CMA, # Subject: Insulin education                              Patient still has not picked up insulin but wife states she will pick it up next week. I also sent in pen needles. Can you please call pt's wife to set up best day to come in?  Thanks [employer]

## 2016-06-10 NOTE — Telephone Encounter (Signed)
Spoke to wife Jennifer--ok per HIPAA---she will have pt start keeping log of BG and bring it in next week sometime

## 2016-06-16 ENCOUNTER — Encounter: Payer: Self-pay | Admitting: Internal Medicine

## 2016-06-16 ENCOUNTER — Ambulatory Visit (INDEPENDENT_AMBULATORY_CARE_PROVIDER_SITE_OTHER): Payer: BLUE CROSS/BLUE SHIELD | Admitting: Internal Medicine

## 2016-06-16 VITALS — BP 106/60 | HR 84 | Temp 98.1°F | Wt 254.5 lb

## 2016-06-16 DIAGNOSIS — N529 Male erectile dysfunction, unspecified: Secondary | ICD-10-CM

## 2016-06-16 NOTE — Progress Notes (Signed)
Subjective:    Patient ID: Martin Mullen, male    DOB: 1981-01-14, 36 y.o.   MRN: [id number]  HPI   Pt presents to the clinic today with c/o erectile dysfunction. He reports he has difficulty initiating and maintaining an erection. This has been going on for a few years, but reports his sex life has been non existant in the last 6 months. He is able to ejaculate. He has tried "Get Hard" pills from the sex store, which may work a little. He reports if he does not fix this issue, his wife has told him she is going to leave him. He would like a RX for Viagra, Cialis, Levitra, etc. He has had a MI s/p PCI with stent.  Review of Systems  Past Medical History:  Diagnosis Date  . Acute transmural inferior wall MI ([place]) 08/28/2012   .5 x 12 mm Veri-flex stent non-DES.  Marland Kitchen Chicken pox   . Coronary artery disease    Inferior ST elevation myocardial infarction in July of 2014. Cardiac catheterization showed 95% distal RCA stenosis and 90% first diagonal stenosis with normal ejection fraction. He underwent PCI in bare-metal stent placement to the distal RCA.  . Diabetes mellitus without complication ([place])   . Hypercholesterolemia   . Tobacco use     Current Outpatient Prescriptions  Medication Sig Dispense Refill  . aspirin 81 MG EC tablet Take 1 tablet (81 mg total) by mouth daily. 30 tablet 4  . Blood Glucose Monitoring Suppl (BAYER CONTOUR NEXT MONITOR) w/Device KIT 1 Device 1 kit 0  . citalopram (CELEXA) 40 MG tablet Take 1 tablet (40 mg total) by mouth daily. 90 tablet 1  . glipiZIDE (GLUCOTROL) 10 MG tablet Take 1 tablet (10 mg total) by mouth 2 (two) times daily before a meal. 60 tablet 3  . glucose blood (BAYER CONTOUR NEXT TEST) test strip 1 each by Other route 3 (three) times daily as needed for other. Use as instructed 100 each 3  . Insulin Detemir (LEVEMIR) 100 UNIT/ML Pen Inject 20 Units into the skin daily at 10 pm. 15 mL 11  . Insulin Pen Needle 31G X 6 MM MISC 1 each by Does not  apply route daily. 50 each 11  . lisinopril (PRINIVIL,ZESTRIL) 2.5 MG tablet Take 1 tablet (2.5 mg total) by mouth daily. 30 tablet 3  . metFORMIN (GLUCOPHAGE) 1000 MG tablet Take 1 tablet (1,000 mg total) by mouth 2 (two) times daily with a meal. TAKE ONE TABLET BY MOUTH TWICE DAILY WITH MEALS 180 tablet 1  . metoprolol tartrate (LOPRESSOR) 25 MG tablet TAKE ONE TABLET BY MOUTH TWICE DAILY 180 tablet 1  . Multiple Vitamins-Minerals (MULTIVITAMIN PO) Take 1 capsule by mouth daily.    . nitroGLYCERIN (NITROSTAT) 0.4 MG SL tablet Place 1 tablet (0.4 mg total) under the tongue every 5 (five) minutes x 3 doses as needed for chest pain. 30 tablet 12  . Omega-3 Fatty Acids (FISH OIL) 1200 MG CAPS Take 1,200 mg by mouth 3 (three) times daily.    . pravastatin (PRAVACHOL) 40 MG tablet TAKE TWO TABLETS BY MOUTH IN THE EVENING AFTER  MEALS 60 tablet 2  . sitaGLIPtin (JANUVIA) 100 MG tablet Take 1 tablet (100 mg total) by mouth daily. 30 tablet 3   No current facility-administered medications for this visit.     No Known Allergies  Family History  Problem Relation Age of Onset  . Arthritis Mother     Rheumatoid  . Diabetes  Mother   . Hyperlipidemia Mother   . Diabetes Father   . Cancer Maternal Grandfather     Prostate  . Parkinson's disease Maternal Grandfather     Social History   Social History  . Marital status: Married    Spouse name: N/A  . Number of children: N/A  . Years of education: N/A   Occupational History  . Not on file.   Social History Main Topics  . Smoking status: Current Every Day Smoker    Packs/day: 1.00    Years: 20.00    Types: Cigarettes  . Smokeless tobacco: Never Used  . Alcohol use 0.0 oz/week     Comment: occasional  . Drug use: No  . Sexual activity: Yes    Birth control/ protection: None   Other Topics Concern  . Not on file   Social History Narrative  . No narrative on file     Constitutional: Denies fever, malaise, fatigue, headache or  abrupt weight changes.   GU: Pt reports erectile dysfunction. Denies urgency, frequency, pain with urination, burning sensation, blood in urine, odor or discharge.   No other specific complaints in a complete review of systems (except as listed in HPI above).     Objective:   Physical Exam   BP 106/60   Pulse 84   Temp 98.1 F (36.7 C) (Oral)   Wt 254 lb 8 oz (115.4 kg)   SpO2 97%   BMI 36.52 kg/m  Wt Readings from Last 3 Encounters:  06/16/16 254 lb 8 oz (115.4 kg)  04/19/16 247 lb (112 kg)  01/22/16 247 lb 4 oz (112.2 kg)    General: Appears his stated age, obese in NAD. Abdomen: Soft and nontender.   BMET    Component Value Date/Time   NA 133 (L) 07/16/2015 1546   NA 139 01/29/2013 0040   K 4.7 07/16/2015 1546   K 4.0 01/29/2013 0040   CL 97 07/16/2015 1546   CL 107 01/29/2013 0040   CO2 27 07/16/2015 1546   CO2 28 01/29/2013 0040   GLUCOSE 502 (HH) 07/16/2015 1546   GLUCOSE 228 (H) 01/29/2013 0040   BUN 12 07/16/2015 1546   BUN 15 01/29/2013 0040   CREATININE 1.05 07/16/2015 1546   CREATININE 0.88 01/29/2013 0040   CALCIUM 9.5 07/16/2015 1546   CALCIUM 9.2 01/29/2013 0040   GFRNONAA >60 03/06/2015 0641   GFRNONAA >60 01/29/2013 0040   GFRAA >60 03/06/2015 0641   GFRAA >60 01/29/2013 0040    Lipid Panel     Component Value Date/Time   CHOL 118 10/13/2015 1527   TRIG 111.0 10/13/2015 1527   HDL 33.10 (L) 10/13/2015 1527   CHOLHDL 4 10/13/2015 1527   VLDL 22.2 10/13/2015 1527   LDLCALC 63 10/13/2015 1527    CBC    Component Value Date/Time   WBC 15.0 (H) 01/18/2016 1350   RBC 4.79 01/18/2016 1350   HGB 15.6 01/18/2016 1350   HGB 16.4 01/29/2013 0040   HCT 45.5 01/18/2016 1350   HCT 48.2 01/29/2013 0040   PLT 236.0 01/18/2016 1350   PLT 222 01/29/2013 0040   MCV 95.0 01/18/2016 1350   MCV 93 01/29/2013 0040   MCH 31.7 03/06/2015 0641   MCHC 34.3 01/18/2016 1350   RDW 13.3 01/18/2016 1350   RDW 13.8 01/29/2013 0040    Hgb A1C Lab  Results  Component Value Date   HGBA1C 11.5 (H) 04/19/2016          Assessment &  Plan:   Erectile Dysfunction, Male:  Will check Testosterone, if low, will refer to urology I don't really want him on PDE5 inhibitors Will run it by his cardiologist  Will follow up after labs Webb Silversmith, NP

## 2016-06-17 ENCOUNTER — Encounter: Payer: Self-pay | Admitting: Internal Medicine

## 2016-06-17 NOTE — Patient Instructions (Signed)

## 2016-06-21 ENCOUNTER — Ambulatory Visit (INDEPENDENT_AMBULATORY_CARE_PROVIDER_SITE_OTHER): Payer: BLUE CROSS/BLUE SHIELD | Admitting: Internal Medicine

## 2016-06-21 ENCOUNTER — Encounter: Payer: Self-pay | Admitting: Internal Medicine

## 2016-06-21 VITALS — BP 130/80 | HR 80 | Temp 97.9°F | Wt 251.0 lb

## 2016-06-21 DIAGNOSIS — S29011A Strain of muscle and tendon of front wall of thorax, initial encounter: Secondary | ICD-10-CM

## 2016-06-21 NOTE — Progress Notes (Signed)
Subjective:    Patient ID: Martin Mullen, male    DOB: 25-Jun-1980, 36 y.o.   MRN: [id number]  HPI  Pt presents to the clinic today with c/o right side rib pain. He reports this started 3 days ago after he was wrestling around with his son. He reports he heard a "pop" and has been in pain ever since. He describes the pain as sore and achy, but can be sharp and stabbing with certain movements. The pain is worse with taking a deep breath or coughing. He denies shortness of breath. He has been taking Aleve and heat without any relief.  Review of Systems      Past Medical History:  Diagnosis Date  . Acute transmural inferior wall MI ([place]) 08/28/2012   .5 x 12 mm Veri-flex stent non-DES.  Marland Kitchen Chicken pox   . Coronary artery disease    Inferior ST elevation myocardial infarction in July of 2014. Cardiac catheterization showed 95% distal RCA stenosis and 90% first diagonal stenosis with normal ejection fraction. He underwent PCI in bare-metal stent placement to the distal RCA.  . Diabetes mellitus without complication ([place])   . Hypercholesterolemia   . Tobacco use     Current Outpatient Prescriptions  Medication Sig Dispense Refill  . aspirin 81 MG EC tablet Take 1 tablet (81 mg total) by mouth daily. 30 tablet 4  . Blood Glucose Monitoring Suppl (BAYER CONTOUR NEXT MONITOR) w/Device KIT 1 Device 1 kit 0  . citalopram (CELEXA) 40 MG tablet Take 1 tablet (40 mg total) by mouth daily. 90 tablet 1  . glipiZIDE (GLUCOTROL) 10 MG tablet Take 1 tablet (10 mg total) by mouth 2 (two) times daily before a meal. 60 tablet 3  . glucose blood (BAYER CONTOUR NEXT TEST) test strip 1 each by Other route 3 (three) times daily as needed for other. Use as instructed 100 each 3  . Insulin Detemir (LEVEMIR) 100 UNIT/ML Pen Inject 20 Units into the skin daily at 10 pm. 15 mL 11  . Insulin Pen Needle 31G X 6 MM MISC 1 each by Does not apply route daily. 50 each 11  . lisinopril (PRINIVIL,ZESTRIL) 2.5 MG tablet  Take 1 tablet (2.5 mg total) by mouth daily. 30 tablet 3  . metFORMIN (GLUCOPHAGE) 1000 MG tablet Take 1 tablet (1,000 mg total) by mouth 2 (two) times daily with a meal. TAKE ONE TABLET BY MOUTH TWICE DAILY WITH MEALS 180 tablet 1  . metoprolol tartrate (LOPRESSOR) 25 MG tablet TAKE ONE TABLET BY MOUTH TWICE DAILY 180 tablet 1  . Multiple Vitamins-Minerals (MULTIVITAMIN PO) Take 1 capsule by mouth daily.    . nitroGLYCERIN (NITROSTAT) 0.4 MG SL tablet Place 1 tablet (0.4 mg total) under the tongue every 5 (five) minutes x 3 doses as needed for chest pain. 30 tablet 12  . Omega-3 Fatty Acids (FISH OIL) 1200 MG CAPS Take 1,200 mg by mouth 3 (three) times daily.    . pravastatin (PRAVACHOL) 40 MG tablet TAKE TWO TABLETS BY MOUTH IN THE EVENING AFTER  MEALS 60 tablet 2  . sitaGLIPtin (JANUVIA) 100 MG tablet Take 1 tablet (100 mg total) by mouth daily. 30 tablet 3   No current facility-administered medications for this visit.     No Known Allergies  Family History  Problem Relation Age of Onset  . Arthritis Mother     Rheumatoid  . Diabetes Mother   . Hyperlipidemia Mother   . Diabetes Father   . Cancer Maternal  Grandfather     Prostate  . Parkinson's disease Maternal Grandfather     Social History   Social History  . Marital status: Married    Spouse name: N/A  . Number of children: N/A  . Years of education: N/A   Occupational History  . Not on file.   Social History Main Topics  . Smoking status: Current Every Day Smoker    Packs/day: 1.00    Years: 20.00    Types: Cigarettes  . Smokeless tobacco: Never Used  . Alcohol use 0.0 oz/week     Comment: occasional  . Drug use: No  . Sexual activity: Yes    Birth control/ protection: None   Other Topics Concern  . Not on file   Social History Narrative  . No narrative on file     Constitutional: Denies fever, malaise, fatigue, headache or abrupt weight changes.  Respiratory: Denies difficulty breathing, shortness of  breath, cough or sputum production.   Cardiovascular: Denies chest pain, chest tightness, palpitations or swelling in the hands or feet.  Musculoskeletal: Pt reports right chest wall pain. Denies decrease in range of motion, difficulty with gait, or joint swelling.    No other specific complaints in a complete review of systems (except as listed in HPI above).  Objective:   Physical Exam   BP 130/80   Pulse 80   Temp 97.9 F (36.6 C) (Oral)   Wt 251 lb (113.9 kg)   SpO2 98%   BMI 36.01 kg/m  Wt Readings from Last 3 Encounters:  06/21/16 251 lb (113.9 kg)  06/16/16 254 lb 8 oz (115.4 kg)  04/19/16 247 lb (112 kg)    General: Appears his stated age, obese in NAD. Cardiovascular: Normal rate and rhythm.  Pulmonary/Chest: Normal effort and positive vesicular breath sounds. No respiratory distress. No wheezes, rales or ronchi noted.  Musculoskeletal: Pain with palpation of the anterior right side chest wall.  BMET    Component Value Date/Time   NA 133 (L) 07/16/2015 1546   NA 139 01/29/2013 0040   K 4.7 07/16/2015 1546   K 4.0 01/29/2013 0040   CL 97 07/16/2015 1546   CL 107 01/29/2013 0040   CO2 27 07/16/2015 1546   CO2 28 01/29/2013 0040   GLUCOSE 502 (HH) 07/16/2015 1546   GLUCOSE 228 (H) 01/29/2013 0040   BUN 12 07/16/2015 1546   BUN 15 01/29/2013 0040   CREATININE 1.05 07/16/2015 1546   CREATININE 0.88 01/29/2013 0040   CALCIUM 9.5 07/16/2015 1546   CALCIUM 9.2 01/29/2013 0040   GFRNONAA >60 03/06/2015 0641   GFRNONAA >60 01/29/2013 0040   GFRAA >60 03/06/2015 0641   GFRAA >60 01/29/2013 0040    Lipid Panel     Component Value Date/Time   CHOL 118 10/13/2015 1527   TRIG 111.0 10/13/2015 1527   HDL 33.10 (L) 10/13/2015 1527   CHOLHDL 4 10/13/2015 1527   VLDL 22.2 10/13/2015 1527   LDLCALC 63 10/13/2015 1527    CBC    Component Value Date/Time   WBC 15.0 (H) 01/18/2016 1350   RBC 4.79 01/18/2016 1350   HGB 15.6 01/18/2016 1350   HGB 16.4  01/29/2013 0040   HCT 45.5 01/18/2016 1350   HCT 48.2 01/29/2013 0040   PLT 236.0 01/18/2016 1350   PLT 222 01/29/2013 0040   MCV 95.0 01/18/2016 1350   MCV 93 01/29/2013 0040   MCH 31.7 03/06/2015 0641   MCHC 34.3 01/18/2016 1350   RDW 13.3  01/18/2016 1350   RDW 13.8 01/29/2013 0040    Hgb A1C Lab Results  Component Value Date   HGBA1C 11.5 (H) 04/19/2016           Assessment & Plan:   Muscle Strain of Chest Wall:  Advised him to avoid strenuous activity Alternate heat and ice Try Ibuprofen if Aleve is not working Stretching exercise given Work note provided  RTC as needed or if symptoms persist or worsen Webb Silversmith, NP

## 2016-06-21 NOTE — Patient Instructions (Signed)
Muscle Strain A muscle strain (pulled muscle) happens when a muscle is stretched beyond normal length. It happens when a sudden, violent force stretches your muscle too far. Usually, a few of the fibers in your muscle are torn. Muscle strain is common in athletes. Recovery usually takes 1-2 weeks. Complete healing takes 5-6 weeks. Follow these instructions at home:  Follow the PRICE method of treatment to help your injury get better. Do this the first 2-3 days after the injury: ? Protect. Protect the muscle to keep it from getting injured again. ? Rest. Limit your activity and rest the injured body part. ? Ice. Put ice in a plastic bag. Place a towel between your skin and the bag. Then, apply the ice and leave it on from 15-20 minutes each hour. After the third day, switch to moist heat packs. ? Compression. Use a splint or elastic bandage on the injured area for comfort. Do not put it on too tightly. ? Elevate. Keep the injured body part above the level of your heart.  Only take medicine as told by your doctor.  Warm up before doing exercise to prevent future muscle strains. Contact a doctor if:  You have more pain or puffiness (swelling) in the injured area.  You feel numbness, tingling, or notice a loss of strength in the injured area. This information is not intended to replace advice given to you by your health care provider. Make sure you discuss any questions you have with your health care provider. Document Released: 11/10/2007 Document Revised: 07/09/2015 Document Reviewed: 08/30/2012 Elsevier Interactive Patient Education  2017 [employer]vier Inc.  

## 2016-06-23 ENCOUNTER — Other Ambulatory Visit (INDEPENDENT_AMBULATORY_CARE_PROVIDER_SITE_OTHER): Payer: BLUE CROSS/BLUE SHIELD

## 2016-06-23 DIAGNOSIS — N529 Male erectile dysfunction, unspecified: Secondary | ICD-10-CM

## 2016-06-23 LAB — TESTOSTERONE: Testosterone: 369.12 ng/dL (ref 300.00–890.00)

## 2016-06-25 ENCOUNTER — Other Ambulatory Visit: Payer: Self-pay | Admitting: Internal Medicine

## 2016-06-27 NOTE — Telephone Encounter (Signed)
Pt has appt with Endo on 5/24--- is pt to continue all meds until appt? Please advise

## 2016-06-27 NOTE — Telephone Encounter (Signed)
yes

## 2016-07-01 ENCOUNTER — Other Ambulatory Visit: Payer: Self-pay

## 2016-07-01 MED ORDER — PRAVASTATIN SODIUM 80 MG PO TABS: 80.0000 mg | ORAL_TABLET | Freq: Every day | ORAL | 2 refills | Status: DC

## 2016-07-18 ENCOUNTER — Other Ambulatory Visit: Payer: Self-pay | Admitting: Internal Medicine

## 2016-07-20 ENCOUNTER — Ambulatory Visit: Payer: BLUE CROSS/BLUE SHIELD | Admitting: Internal Medicine

## 2016-07-25 ENCOUNTER — Encounter: Payer: Self-pay | Admitting: Internal Medicine

## 2016-07-25 ENCOUNTER — Ambulatory Visit (INDEPENDENT_AMBULATORY_CARE_PROVIDER_SITE_OTHER): Payer: BLUE CROSS/BLUE SHIELD | Admitting: Internal Medicine

## 2016-07-25 VITALS — BP 122/80 | HR 90 | Temp 98.0°F | Wt 256.8 lb

## 2016-07-25 DIAGNOSIS — E1140 Type 2 diabetes mellitus with diabetic neuropathy, unspecified: Secondary | ICD-10-CM

## 2016-07-25 DIAGNOSIS — IMO0002 Reserved for concepts with insufficient information to code with codable children: Secondary | ICD-10-CM

## 2016-07-25 DIAGNOSIS — R0789 Other chest pain: Secondary | ICD-10-CM

## 2016-07-25 DIAGNOSIS — E1165 Type 2 diabetes mellitus with hyperglycemia: Secondary | ICD-10-CM | POA: Diagnosis not present

## 2016-07-25 NOTE — Progress Notes (Signed)
Subjective:    Patient ID: Martin Mullen, male    DOB: 1980-07-16, 36 y.o.   MRN: [id number]  HPI  Pt presents to the clinic today for 3 month follow up of DM 2. His last A1C was 11.9%. He is taking Metformin, Glipizide and Januvia. He was started on Levemir and referred to endocrinology. He refused to pick up his Levemir and "no showed" his appt with the endocrinologist. His sugars are ranging 72-140's. His fasting this morning was 126.  Of note, he reports he started having chest pain 15 minutes ago. It is intermittent. He describes it as sharp and stabbing. He denies shortness of breath, lightheadedness or sweating with it. He has not taken anything OTC for it. He reports he recently ate spaghetti for lunch, and thinks this could be reflux related.  Review of Systems      Past Medical History:  Diagnosis Date  . Acute transmural inferior wall MI ([place]) 08/28/2012   .5 x 12 mm Veri-flex stent non-DES.  Marland Kitchen Chicken pox   . Coronary artery disease    Inferior ST elevation myocardial infarction in July of 2014. Cardiac catheterization showed 95% distal RCA stenosis and 90% first diagonal stenosis with normal ejection fraction. He underwent PCI in bare-metal stent placement to the distal RCA.  . Diabetes mellitus without complication ([place])   . Hypercholesterolemia   . Tobacco use     Current Outpatient Prescriptions  Medication Sig Dispense Refill  . aspirin 81 MG EC tablet Take 1 tablet (81 mg total) by mouth daily. 30 tablet 4  . Blood Glucose Monitoring Suppl (BAYER CONTOUR NEXT MONITOR) w/Device KIT 1 Device 1 kit 0  . citalopram (CELEXA) 40 MG tablet Take 1 tablet (40 mg total) by mouth daily. 90 tablet 1  . glipiZIDE (GLUCOTROL) 10 MG tablet TAKE ONE TABLET BY MOUTH TWICE DAILY BEFORE MEAL(S) 60 tablet 3  . glucose blood (BAYER CONTOUR NEXT TEST) test strip 1 each by Other route 3 (three) times daily as needed for other. Use as instructed 100 each 3  . Insulin Detemir (LEVEMIR)  100 UNIT/ML Pen Inject 20 Units into the skin daily at 10 pm. 15 mL 11  . Insulin Pen Needle 31G X 6 MM MISC 1 each by Does not apply route daily. 50 each 11  . lisinopril (PRINIVIL,ZESTRIL) 2.5 MG tablet TAKE ONE TABLET BY MOUTH ONCE DAILY 90 tablet 0  . metFORMIN (GLUCOPHAGE) 1000 MG tablet Take 1 tablet (1,000 mg total) by mouth 2 (two) times daily with a meal. TAKE ONE TABLET BY MOUTH TWICE DAILY WITH MEALS 180 tablet 1  . metoprolol tartrate (LOPRESSOR) 25 MG tablet TAKE ONE TABLET BY MOUTH TWICE DAILY 180 tablet 1  . Multiple Vitamins-Minerals (MULTIVITAMIN PO) Take 1 capsule by mouth daily.    . nitroGLYCERIN (NITROSTAT) 0.4 MG SL tablet Place 1 tablet (0.4 mg total) under the tongue every 5 (five) minutes x 3 doses as needed for chest pain. 30 tablet 12  . Omega-3 Fatty Acids (FISH OIL) 1200 MG CAPS Take 1,200 mg by mouth 3 (three) times daily.    . pravastatin (PRAVACHOL) 80 MG tablet Take 1 tablet (80 mg total) by mouth daily. 30 tablet 2  . sitaGLIPtin (JANUVIA) 100 MG tablet Take 1 tablet (100 mg total) by mouth daily. 30 tablet 3   No current facility-administered medications for this visit.     No Known Allergies  Family History  Problem Relation Age of Onset  . Arthritis Mother  Rheumatoid  . Diabetes Mother   . Hyperlipidemia Mother   . Diabetes Father   . Cancer Maternal Grandfather        Prostate  . Parkinson's disease Maternal Grandfather     Social History   Social History  . Marital status: Married    Spouse name: N/A  . Number of children: N/A  . Years of education: N/A   Occupational History  . Not on file.   Social History Main Topics  . Smoking status: Current Every Day Smoker    Packs/day: 1.00    Years: 20.00    Types: Cigarettes  . Smokeless tobacco: Never Used  . Alcohol use 0.0 oz/week     Comment: occasional  . Drug use: No  . Sexual activity: Yes    Birth control/ protection: None   Other Topics Concern  . Not on file    Social History Narrative  . No narrative on file     Constitutional: Denies fever, malaise, fatigue, headache or abrupt weight changes.  Respiratory: Denies difficulty breathing, shortness of breath, cough or sputum production.   Cardiovascular: Pt reports chest pain. Denies chest tightness, palpitations or swelling in the hands or feet.  Gastrointestinal: Denies abdominal pain, bloating, constipation, diarrhea or blood in the stool.  Skin: Denies redness, rashes, lesions or ulcercations.    No other specific complaints in a complete review of systems (except as listed in HPI above).  Objective:   Physical Exam   BP 122/80   Pulse 90   Temp 98 F (36.7 C) (Oral)   Wt 256 lb 12 oz (116.5 kg)   SpO2 98%   BMI 36.84 kg/m  Wt Readings from Last 3 Encounters:  07/25/16 256 lb 12 oz (116.5 kg)  06/21/16 251 lb (113.9 kg)  06/16/16 254 lb 8 oz (115.4 kg)    General: Appears his stated age, obese in NAD. Skin: Warm, dry and intact. No ulcerations noted. Cardiovascular: Normal rate and rhythm. S1,S2 noted.  No murmur, rubs or gallops noted.  Pulmonary/Chest: Normal effort and positive vesicular breath sounds. No respiratory distress. No wheezes, rales or ronchi noted.  ANeurological: Alert and oriented.    BMET    Component Value Date/Time   NA 133 (L) 07/16/2015 1546   NA 139 01/29/2013 0040   K 4.7 07/16/2015 1546   K 4.0 01/29/2013 0040   CL 97 07/16/2015 1546   CL 107 01/29/2013 0040   CO2 27 07/16/2015 1546   CO2 28 01/29/2013 0040   GLUCOSE 502 (HH) 07/16/2015 1546   GLUCOSE 228 (H) 01/29/2013 0040   BUN 12 07/16/2015 1546   BUN 15 01/29/2013 0040   CREATININE 1.05 07/16/2015 1546   CREATININE 0.88 01/29/2013 0040   CALCIUM 9.5 07/16/2015 1546   CALCIUM 9.2 01/29/2013 0040   GFRNONAA >60 03/06/2015 0641   GFRNONAA >60 01/29/2013 0040   GFRAA >60 03/06/2015 0641   GFRAA >60 01/29/2013 0040    Lipid Panel     Component Value Date/Time   CHOL 118  10/13/2015 1527   TRIG 111.0 10/13/2015 1527   HDL 33.10 (L) 10/13/2015 1527   CHOLHDL 4 10/13/2015 1527   VLDL 22.2 10/13/2015 1527   LDLCALC 63 10/13/2015 1527    CBC    Component Value Date/Time   WBC 15.0 (H) 01/18/2016 1350   RBC 4.79 01/18/2016 1350   HGB 15.6 01/18/2016 1350   HGB 16.4 01/29/2013 0040   HCT 45.5 01/18/2016 1350   HCT 48.2   01/29/2013 0040   PLT 236.0 01/18/2016 1350   PLT 222 01/29/2013 0040   MCV 95.0 01/18/2016 1350   MCV 93 01/29/2013 0040   MCH 31.7 03/06/2015 0641   MCHC 34.3 01/18/2016 1350   RDW 13.3 01/18/2016 1350   RDW 13.8 01/29/2013 0040    Hgb A1C Lab Results  Component Value Date   HGBA1C 11.5 (H) 04/19/2016           Assessment & Plan:   Chest Pain:  ECG today- no acute findings Start Prilosec OTC  Return/ER precautions discussed Webb Silversmith, NP

## 2016-07-25 NOTE — Assessment & Plan Note (Signed)
A1C today Advised him to perform daily foot exams Encouraged yearly eye exams Continue Metformin, Januvia and Glipizide If A1C > 8, will need to start Levemir and will refer him to endocrinology again

## 2016-07-25 NOTE — Patient Instructions (Signed)

## 2016-07-26 LAB — HEMOGLOBIN A1C: Hgb A1c MFr Bld: 7.2 % — ABNORMAL HIGH (ref 4.6–6.5)

## 2016-08-18 ENCOUNTER — Other Ambulatory Visit: Payer: Self-pay | Admitting: Internal Medicine

## 2016-09-07 ENCOUNTER — Ambulatory Visit (INDEPENDENT_AMBULATORY_CARE_PROVIDER_SITE_OTHER): Payer: BLUE CROSS/BLUE SHIELD | Admitting: Licensed Clinical Social Worker

## 2016-09-07 DIAGNOSIS — F321 Major depressive disorder, single episode, moderate: Secondary | ICD-10-CM

## 2016-09-09 ENCOUNTER — Telehealth: Payer: Self-pay

## 2016-09-09 NOTE — Telephone Encounter (Signed)
Request [facility] records faxed

## 2016-09-09 NOTE — Telephone Encounter (Signed)
Pt saw Martin Mullen for therapeutic session on 09/09/16 and was dx with clinical depression. Note forwarded to [facility] who will request notes from [facility] and then will contact pt. Pt voiced understanding and will wait on cb. [address]

## 2016-09-14 ENCOUNTER — Ambulatory Visit (INDEPENDENT_AMBULATORY_CARE_PROVIDER_SITE_OTHER): Payer: BLUE CROSS/BLUE SHIELD | Admitting: Licensed Clinical Social Worker

## 2016-09-14 DIAGNOSIS — F321 Major depressive disorder, single episode, moderate: Secondary | ICD-10-CM

## 2016-09-20 NOTE — Telephone Encounter (Signed)
Have you received records? I do not see them, I have requested again... Does pt need to schedule office visit in the meantime?

## 2016-09-22 NOTE — Telephone Encounter (Signed)
Left message on voicemail letting pt know that we have received notes  I have placed them in your inbox

## 2016-09-22 NOTE — Telephone Encounter (Signed)
Will review once I get back and decide then if OV is necessary.

## 2016-09-26 MED ORDER — BUPROPION HCL ER (XL) 150 MG PO TB24: 150.0000 mg | ORAL_TABLET | Freq: Every day | ORAL | 2 refills | Status: DC

## 2016-09-26 NOTE — Telephone Encounter (Signed)
Pt states he stopped Celexa due to ED

## 2016-09-26 NOTE — Addendum Note (Signed)
Addended by: [name] on: 09/26/2016 04:24 PM   Modules accepted: Orders

## 2016-09-26 NOTE — Addendum Note (Signed)
Addended by: Roena Malady on: 09/26/2016 04:15 PM   Modules accepted: Orders

## 2016-09-26 NOTE — Telephone Encounter (Signed)
Pt came into the office requesting treatment and I let him know that you could not address until we received records.... Pt reported he was down and stressed because his wife cheated and left/or was leaving him and he needs help.Marland KitchenMarland KitchenMarland Kitchen

## 2016-09-26 NOTE — Telephone Encounter (Signed)
Notes reviewed. Notes say that pt does not want to use antidepressant medications. Is this still how he feels or has he changed his mind?

## 2016-09-26 NOTE — Telephone Encounter (Signed)
Celexa is on his med list. Is he taking this?

## 2016-09-26 NOTE — Telephone Encounter (Signed)
Will try Wellbutrin. eRx sent to pharmacy.

## 2016-09-26 NOTE — Telephone Encounter (Signed)
Pt is aware as instructed and will call with update in 3-4 weeks

## 2016-09-28 ENCOUNTER — Ambulatory Visit (INDEPENDENT_AMBULATORY_CARE_PROVIDER_SITE_OTHER): Payer: BLUE CROSS/BLUE SHIELD | Admitting: Licensed Clinical Social Worker

## 2016-09-28 ENCOUNTER — Ambulatory Visit: Payer: BLUE CROSS/BLUE SHIELD | Admitting: Licensed Clinical Social Worker

## 2016-09-28 DIAGNOSIS — F321 Major depressive disorder, single episode, moderate: Secondary | ICD-10-CM

## 2016-10-18 ENCOUNTER — Other Ambulatory Visit: Payer: Self-pay | Admitting: Internal Medicine

## 2016-10-20 ENCOUNTER — Ambulatory Visit (INDEPENDENT_AMBULATORY_CARE_PROVIDER_SITE_OTHER): Payer: BLUE CROSS/BLUE SHIELD | Admitting: Licensed Clinical Social Worker

## 2016-10-20 DIAGNOSIS — F324 Major depressive disorder, single episode, in partial remission: Secondary | ICD-10-CM | POA: Diagnosis not present

## 2016-10-21 ENCOUNTER — Other Ambulatory Visit: Payer: Self-pay

## 2016-10-21 MED ORDER — SIMVASTATIN 40 MG PO TABS: 80.0000 mg | ORAL_TABLET | Freq: Every day | ORAL | 2 refills | Status: DC

## 2016-10-21 MED ORDER — SIMVASTATIN 80 MG PO TABS: 80.0000 mg | ORAL_TABLET | Freq: Every day | ORAL | 3 refills | Status: DC

## 2016-10-21 NOTE — Telephone Encounter (Signed)
Received fax from [facility] stating that Pravastatin is no longer on $4 list... Lovasatin and Simvastatin is on the $4 list per 30 days and Atorvastatin is $9 for 30 day supply  Per VO from [facility] send in Simvastatin 80mg 

## 2016-10-21 NOTE — Addendum Note (Signed)
Addended by: Roena Malady on: 10/21/2016 02:23 PM   Modules accepted: Orders

## 2016-11-16 ENCOUNTER — Ambulatory Visit (INDEPENDENT_AMBULATORY_CARE_PROVIDER_SITE_OTHER): Payer: BLUE CROSS/BLUE SHIELD | Admitting: Licensed Clinical Social Worker

## 2016-11-16 DIAGNOSIS — F324 Major depressive disorder, single episode, in partial remission: Secondary | ICD-10-CM

## 2016-11-17 ENCOUNTER — Other Ambulatory Visit: Payer: Self-pay | Admitting: Internal Medicine

## 2016-12-21 ENCOUNTER — Ambulatory Visit (INDEPENDENT_AMBULATORY_CARE_PROVIDER_SITE_OTHER): Payer: BLUE CROSS/BLUE SHIELD | Admitting: Licensed Clinical Social Worker

## 2016-12-21 DIAGNOSIS — F324 Major depressive disorder, single episode, in partial remission: Secondary | ICD-10-CM | POA: Diagnosis not present

## 2017-01-18 ENCOUNTER — Ambulatory Visit: Payer: BLUE CROSS/BLUE SHIELD | Admitting: Licensed Clinical Social Worker

## 2017-01-19 ENCOUNTER — Encounter: Payer: BLUE CROSS/BLUE SHIELD | Admitting: Internal Medicine

## 2017-03-18 ENCOUNTER — Emergency Department: Payer: BLUE CROSS/BLUE SHIELD

## 2017-03-18 ENCOUNTER — Other Ambulatory Visit: Payer: Self-pay

## 2017-03-18 ENCOUNTER — Encounter: Payer: Self-pay | Admitting: Emergency Medicine

## 2017-03-18 ENCOUNTER — Inpatient Hospital Stay
Admission: EM | Admit: 2017-03-18 | Discharge: 2017-03-21 | DRG: 872 | Disposition: A | Discharge status: Home / Self Care | Payer: BLUE CROSS/BLUE SHIELD | Attending: Internal Medicine | Admitting: Internal Medicine

## 2017-03-18 DIAGNOSIS — Z91120 Patient's intentional underdosing of medication regimen due to financial hardship: Secondary | ICD-10-CM | POA: Diagnosis not present

## 2017-03-18 DIAGNOSIS — L97519 Non-pressure chronic ulcer of other part of right foot with unspecified severity: Secondary | ICD-10-CM | POA: Diagnosis present

## 2017-03-18 DIAGNOSIS — E1165 Type 2 diabetes mellitus with hyperglycemia: Secondary | ICD-10-CM | POA: Diagnosis present

## 2017-03-18 DIAGNOSIS — I2510 Atherosclerotic heart disease of native coronary artery without angina pectoris: Secondary | ICD-10-CM | POA: Diagnosis present

## 2017-03-18 DIAGNOSIS — Z79899 Other long term (current) drug therapy: Secondary | ICD-10-CM | POA: Diagnosis not present

## 2017-03-18 DIAGNOSIS — I252 Old myocardial infarction: Secondary | ICD-10-CM

## 2017-03-18 DIAGNOSIS — Z955 Presence of coronary angioplasty implant and graft: Secondary | ICD-10-CM | POA: Diagnosis not present

## 2017-03-18 DIAGNOSIS — L0291 Cutaneous abscess, unspecified: Secondary | ICD-10-CM | POA: Diagnosis not present

## 2017-03-18 DIAGNOSIS — Z7982 Long term (current) use of aspirin: Secondary | ICD-10-CM

## 2017-03-18 DIAGNOSIS — E13621 Other specified diabetes mellitus with foot ulcer: Secondary | ICD-10-CM | POA: Diagnosis not present

## 2017-03-18 DIAGNOSIS — L97529 Non-pressure chronic ulcer of other part of left foot with unspecified severity: Secondary | ICD-10-CM | POA: Diagnosis present

## 2017-03-18 DIAGNOSIS — E1142 Type 2 diabetes mellitus with diabetic polyneuropathy: Secondary | ICD-10-CM | POA: Diagnosis not present

## 2017-03-18 DIAGNOSIS — F17210 Nicotine dependence, cigarettes, uncomplicated: Secondary | ICD-10-CM | POA: Diagnosis not present

## 2017-03-18 DIAGNOSIS — Z794 Long term (current) use of insulin: Secondary | ICD-10-CM | POA: Diagnosis not present

## 2017-03-18 DIAGNOSIS — L03031 Cellulitis of right toe: Secondary | ICD-10-CM | POA: Diagnosis present

## 2017-03-18 DIAGNOSIS — M79671 Pain in right foot: Secondary | ICD-10-CM | POA: Diagnosis not present

## 2017-03-18 DIAGNOSIS — I10 Essential (primary) hypertension: Secondary | ICD-10-CM | POA: Diagnosis not present

## 2017-03-18 DIAGNOSIS — L02611 Cutaneous abscess of right foot: Secondary | ICD-10-CM | POA: Diagnosis not present

## 2017-03-18 DIAGNOSIS — E11621 Type 2 diabetes mellitus with foot ulcer: Secondary | ICD-10-CM | POA: Diagnosis present

## 2017-03-18 DIAGNOSIS — A419 Sepsis, unspecified organism: Principal | ICD-10-CM | POA: Diagnosis present

## 2017-03-18 DIAGNOSIS — M7989 Other specified soft tissue disorders: Secondary | ICD-10-CM | POA: Diagnosis not present

## 2017-03-18 DIAGNOSIS — L03115 Cellulitis of right lower limb: Secondary | ICD-10-CM | POA: Diagnosis present

## 2017-03-18 DIAGNOSIS — Z9114 Patient's other noncompliance with medication regimen: Secondary | ICD-10-CM | POA: Diagnosis not present

## 2017-03-18 LAB — CBC WITH DIFFERENTIAL/PLATELET
Basophils Absolute: 0.1 10*3/uL (ref 0–0.1)
Basophils Relative: 1 %
EOS ABS: 0.2 10*3/uL (ref 0–0.7)
Eosinophils Relative: 1 %
HEMATOCRIT: 48 % (ref 40.0–52.0)
Hemoglobin: 16.5 g/dL (ref 13.0–18.0)
LYMPHS ABS: 3.4 10*3/uL (ref 1.0–3.6)
Lymphocytes Relative: 18 %
MCH: 31.9 pg (ref 26.0–34.0)
MCHC: 34.3 g/dL (ref 32.0–36.0)
MCV: 93 fL (ref 80.0–100.0)
Monocytes Absolute: 1.3 10*3/uL — ABNORMAL HIGH (ref 0.2–1.0)
Monocytes Relative: 7 %
NEUTROS ABS: 13.8 10*3/uL — AB (ref 1.4–6.5)
Neutrophils Relative %: 73 %
Platelets: 250 10*3/uL (ref 150–440)
RBC: 5.17 MIL/uL (ref 4.40–5.90)
RDW: 13.1 % (ref 11.5–14.5)
WBC: 18.8 10*3/uL — AB (ref 3.8–10.6)

## 2017-03-18 LAB — GLUCOSE, CAPILLARY
GLUCOSE-CAPILLARY: 383 mg/dL — AB (ref 65–99)
GLUCOSE-CAPILLARY: 351 mg/dL — AB (ref 65–99)

## 2017-03-18 LAB — COMPREHENSIVE METABOLIC PANEL
ALT: 15 U/L — AB (ref 17–63)
ANION GAP: 12 (ref 5–15)
AST: 18 U/L (ref 15–41)
Albumin: 3.6 g/dL (ref 3.5–5.0)
Alkaline Phosphatase: 101 U/L (ref 38–126)
BUN: 9 mg/dL (ref 6–20)
CHLORIDE: 98 mmol/L — AB (ref 101–111)
CO2: 24 mmol/L (ref 22–32)
CREATININE: 0.82 mg/dL (ref 0.61–1.24)
Calcium: 8.8 mg/dL — ABNORMAL LOW (ref 8.9–10.3)
Glucose, Bld: 419 mg/dL — ABNORMAL HIGH (ref 65–99)
POTASSIUM: 3.8 mmol/L (ref 3.5–5.1)
SODIUM: 134 mmol/L — AB (ref 135–145)
Total Bilirubin: 0.5 mg/dL (ref 0.3–1.2)
Total Protein: 7.1 g/dL (ref 6.5–8.1)

## 2017-03-18 LAB — LACTIC ACID, PLASMA
LACTIC ACID, VENOUS: 2.3 mmol/L — AB (ref 0.5–1.9)
LACTIC ACID, VENOUS: 1.6 mmol/L (ref 0.5–1.9)

## 2017-03-18 MED ORDER — PIPERACILLIN-TAZOBACTAM 3.375 G IVPB 30 MIN: 3.3750 g | Freq: Four times a day (QID) | INTRAVENOUS | Status: DC

## 2017-03-18 MED ORDER — INSULIN ASPART 100 UNIT/ML ~~LOC~~ SOLN
0.0000 [IU] | Freq: Three times a day (TID) | SUBCUTANEOUS | Status: DC
  Administered 2017-03-19: 7 [IU] via SUBCUTANEOUS
  Administered 2017-03-19 – 2017-03-20 (×3): 2 [IU] via SUBCUTANEOUS
  Administered 2017-03-20: 7 [IU] via SUBCUTANEOUS
  Administered 2017-03-20: 2 [IU] via SUBCUTANEOUS
  Administered 2017-03-21 (×2): 3 [IU] via SUBCUTANEOUS
  Filled 2017-03-18 (×8): qty 1

## 2017-03-18 MED ORDER — VANCOMYCIN HCL IN DEXTROSE 1-5 GM/200ML-% IV SOLN
1000.0000 mg | Freq: Three times a day (TID) | INTRAVENOUS | Status: DC
Start: 2017-03-19 — End: 2017-03-20
  Administered 2017-03-19 – 2017-03-20 (×3): 1000 mg via INTRAVENOUS
  Filled 2017-03-18 (×5): qty 200

## 2017-03-18 MED ORDER — PIPERACILLIN-TAZOBACTAM 3.375 G IVPB 30 MIN
3.3750 g | Freq: Once | INTRAVENOUS | Status: AC
  Administered 2017-03-18: 3.375 g via INTRAVENOUS
  Filled 2017-03-18: qty 50

## 2017-03-18 MED ORDER — SODIUM CHLORIDE 0.9 % IV SOLN
INTRAVENOUS | Status: DC
  Administered 2017-03-18 – 2017-03-19 (×2): via INTRAVENOUS

## 2017-03-18 MED ORDER — DOCUSATE SODIUM 100 MG PO CAPS
100.0000 mg | ORAL_CAPSULE | Freq: Two times a day (BID) | ORAL | Status: DC
  Administered 2017-03-18 – 2017-03-21 (×5): 100 mg via ORAL
  Filled 2017-03-18 (×6): qty 1

## 2017-03-18 MED ORDER — BISACODYL 5 MG PO TBEC: 5.0000 mg | DELAYED_RELEASE_TABLET | Freq: Every day | ORAL | Status: DC | PRN

## 2017-03-18 MED ORDER — NICOTINE POLACRILEX 2 MG MT GUM
2.0000 mg | CHEWING_GUM | OROMUCOSAL | Status: DC | PRN
  Filled 2017-03-18: qty 1

## 2017-03-18 MED ORDER — PIPERACILLIN-TAZOBACTAM 3.375 G IVPB
3.3750 g | Freq: Three times a day (TID) | INTRAVENOUS | Status: DC
  Administered 2017-03-19 – 2017-03-21 (×7): 3.375 g via INTRAVENOUS
  Filled 2017-03-18 (×6): qty 50

## 2017-03-18 MED ORDER — ACETAMINOPHEN 325 MG PO TABS: 650.0000 mg | ORAL_TABLET | Freq: Four times a day (QID) | ORAL | Status: DC | PRN

## 2017-03-18 MED ORDER — BUPROPION HCL ER (XL) 150 MG PO TB24
150.0000 mg | ORAL_TABLET | Freq: Every day | ORAL | Status: DC
  Administered 2017-03-19 – 2017-03-21 (×3): 150 mg via ORAL
  Filled 2017-03-18 (×3): qty 1

## 2017-03-18 MED ORDER — LISINOPRIL 5 MG PO TABS
2.5000 mg | ORAL_TABLET | Freq: Every day | ORAL | Status: DC
  Administered 2017-03-20 – 2017-03-21 (×2): 2.5 mg via ORAL
  Filled 2017-03-18 (×2): qty 1

## 2017-03-18 MED ORDER — VANCOMYCIN HCL IN DEXTROSE 1-5 GM/200ML-% IV SOLN
1000.0000 mg | Freq: Once | INTRAVENOUS | Status: AC
  Administered 2017-03-18: 1000 mg via INTRAVENOUS
  Filled 2017-03-18: qty 200

## 2017-03-18 MED ORDER — INSULIN ASPART 100 UNIT/ML ~~LOC~~ SOLN
0.0000 [IU] | Freq: Every day | SUBCUTANEOUS | Status: DC
  Administered 2017-03-18: 5 [IU] via SUBCUTANEOUS
  Administered 2017-03-19: 2 [IU] via SUBCUTANEOUS
  Administered 2017-03-20: 3 [IU] via SUBCUTANEOUS
  Filled 2017-03-18 (×3): qty 1

## 2017-03-18 MED ORDER — HYDROCODONE-ACETAMINOPHEN 5-325 MG PO TABS: 1.0000 | ORAL_TABLET | ORAL | Status: DC | PRN

## 2017-03-18 MED ORDER — ADULT MULTIVITAMIN LIQUID CH
Freq: Every day | ORAL | Status: DC
  Filled 2017-03-18 (×3): qty 15

## 2017-03-18 MED ORDER — INSULIN DETEMIR 100 UNIT/ML ~~LOC~~ SOLN
20.0000 [IU] | Freq: Every day | SUBCUTANEOUS | Status: DC
  Filled 2017-03-18: qty 0.2

## 2017-03-18 MED ORDER — ONDANSETRON HCL 4 MG PO TABS: 4.0000 mg | ORAL_TABLET | Freq: Four times a day (QID) | ORAL | Status: DC | PRN

## 2017-03-18 MED ORDER — ONDANSETRON HCL 4 MG/2ML IJ SOLN: 4.0000 mg | Freq: Four times a day (QID) | INTRAMUSCULAR | Status: DC | PRN

## 2017-03-18 MED ORDER — HEPARIN SODIUM (PORCINE) 5000 UNIT/ML IJ SOLN
5000.0000 [IU] | Freq: Three times a day (TID) | INTRAMUSCULAR | Status: DC
  Administered 2017-03-19 – 2017-03-21 (×7): 5000 [IU] via SUBCUTANEOUS
  Filled 2017-03-18 (×7): qty 1

## 2017-03-18 MED ORDER — ACETAMINOPHEN 650 MG RE SUPP: 650.0000 mg | Freq: Four times a day (QID) | RECTAL | Status: DC | PRN

## 2017-03-18 MED ORDER — ATORVASTATIN CALCIUM 20 MG PO TABS
40.0000 mg | ORAL_TABLET | Freq: Every day | ORAL | Status: DC
  Administered 2017-03-19 – 2017-03-20 (×2): 40 mg via ORAL
  Filled 2017-03-18 (×2): qty 2

## 2017-03-18 NOTE — Progress Notes (Signed)
Pharmacy Antibiotic Note  Martin Mullen is a 37 y.o. male admitted on 03/18/2017 with sepsis.  Pharmacy has been consulted for vanc/zosyn dosing.  Plan: Patient received vanc 1g and zosyn 3.375g IV x 1 in ED Will continue vanc 1g IV q8h w/ 6 hour stack and check VT 0203 @0500  prior to 4th dose w/ am labs. Will continue zosyn 3.375g IV q8h via EI  Ke 0.108 (maxed CrCl @ 125 ml/min d/t overestimation in obese patients) T1/2 6 ~ 8 hrs  Goal trough 15 - 20 mcg/mL  Height: 5\' 10"  (177.8 cm) Weight: 240 lb (108.9 kg) IBW/kg (Calculated) : 73  Temp (24hrs), Avg:99.5 F (37.5 C), Min:99.5 F (37.5 C), Max:99.5 F (37.5 C)  Recent Labs  Lab 03/18/17 1823 03/18/17 2028  WBC 18.8*  --   CREATININE 0.82  --   LATICACIDVEN 2.3* 1.6    Estimated Creatinine Clearance: 154 mL/min (by C-G formula based on SCr of 0.82 mg/dL).    No Known Allergies  Thank you for allowing pharmacy to be a part of this patient's care.  Thomasene Ripple, PharmD, BCPS Clinical Pharmacist 03/18/2017

## 2017-03-18 NOTE — ED Provider Notes (Signed)
[facility] Emergency Department Provider Note  ____________________________________________   First MD Initiated Contact with Patient 03/18/17 1909     (approximate)  I have reviewed the triage vital signs and the nursing notes.   HISTORY  Chief Complaint Wound Infection   HPI Martin Mullen is a 37 y.o. male with a history of inferior wall MI, diabetes and peripheral neuropathy who is presenting to the emergency department today with a right great toe with blister that he says his developed a surrounding redness which is since gone of his right leg.  He says that the blisters been there for about 2 weeks but the redness just started 2 days ago and has expanded since.  He denies fever or body aches.  Says that there has been a yellow drainage all afternoon the from the blister to the right toe.  Past Medical History:  Diagnosis Date  . Acute transmural inferior wall MI ([place]) 08/28/2012   .5 x 12 mm Veri-flex stent non-DES.  Marland Kitchen Chicken pox   . Coronary artery disease    Inferior ST elevation myocardial infarction in July of 2014. Cardiac catheterization showed 95% distal RCA stenosis and 90% first diagonal stenosis with normal ejection fraction. He underwent PCI in bare-metal stent placement to the distal RCA.  . Diabetes mellitus without complication ([place])   . Hypercholesterolemia   . Tobacco use     Patient Active Problem List   Diagnosis Date Noted  . Coronary artery disease   . Obesity (BMI 30-39.9) 07/09/2013  . DM type 2, uncontrolled, with neuropathy ([place]) 07/09/2013  . STEMI s/p RCA stent 08/2012 09/05/2012  . HTN (hypertension) 09/05/2012  . HLD (hyperlipidemia) 09/05/2012  . Nicotine dependence 09/05/2012  . Adjustment disorder with mixed anxiety and depressed mood 09/05/2012    Past Surgical History:  Procedure Laterality Date  . CORONARY ANGIOPLASTY WITH STENT PLACEMENT    . LEFT HEART CATHETERIZATION WITH CORONARY ANGIOGRAM N/A  08/28/2012   Procedure: LEFT HEART CATHETERIZATION WITH CORONARY ANGIOGRAM;  Surgeon: Laverda Page, MD;  Location: [facility] CATH LAB;  Service: Cardiovascular;  Laterality: N/A;    Prior to Admission medications   Medication Sig Start Date End Date Taking? Authorizing Provider  aspirin 81 MG EC tablet Take 1 tablet (81 mg total) by mouth daily. 09/05/12  Yes Rai, Ripudeep K, MD  lisinopril (PRINIVIL,ZESTRIL) 2.5 MG tablet TAKE ONE TABLET BY MOUTH ONCE DAILY 07/20/16  Yes [name], NP  Blood Glucose Monitoring Suppl (BAYER CONTOUR NEXT MONITOR) w/Device KIT 1 Device 07/22/15   [name], NP  buPROPion (WELLBUTRIN XL) 150 MG 24 hr tablet Take 1 tablet (150 mg total) by mouth daily. 09/26/16   [name], NP  glipiZIDE (GLUCOTROL) 10 MG tablet TAKE ONE TABLET BY MOUTH TWICE DAILY BEFORE MEAL(S) Patient not taking: Reported on 03/18/2017 06/27/16   [name], NP  glucose blood (BAYER CONTOUR NEXT TEST) test strip 1 each by Other route 3 (three) times daily as needed for other. Use as instructed 05/26/16   [name], NP  Insulin Detemir (LEVEMIR) 100 UNIT/ML Pen Inject 20 Units into the skin daily at 10 pm. Patient not taking: Reported on 07/25/2016 04/25/16   [name], NP  Insulin Pen Needle 31G X 6 MM MISC 1 each by Does not apply route daily. 05/05/16   [name], NP  JANUVIA 100 MG tablet TAKE ONE TABLET BY MOUTH ONCE DAILY Patient not taking: Reported on 03/18/2017  08/18/16   [name], NP  metFORMIN (GLUCOPHAGE) 1000 MG tablet TAKE ONE TABLET BY MOUTH TWICE DAILY WITH MEALS Patient not taking: Reported on 03/18/2017 11/17/16   [name], NP  Multiple Vitamins-Minerals (MULTIVITAMIN PO) Take 1 capsule by mouth daily.    [provider]  nitroGLYCERIN (NITROSTAT) 0.4 MG SL tablet Place 1 tablet (0.4 mg total) under the tongue every 5 (five) minutes x 3 doses as needed for chest pain. Patient not taking: Reported on 03/18/2017 11/21/13   [name], NP    Omega-3 Fatty Acids (FISH OIL) 1200 MG CAPS Take 1,200 mg by mouth 3 (three) times daily.    [provider]  simvastatin (ZOCOR) 80 MG tablet Take 1 tablet (80 mg total) by mouth daily. Patient not taking: Reported on 03/18/2017 10/21/16   [name], NP    Allergies Patient has no known allergies.  Family History  Problem Relation Age of Onset  . Arthritis Mother        Rheumatoid  . Diabetes Mother   . Hyperlipidemia Mother   . Diabetes Father   . Cancer Maternal Grandfather        Prostate  . Parkinson's disease Maternal Grandfather     Social History Social History   Tobacco Use  . Smoking status: Current Every Day Smoker    Packs/day: 1.00    Years: 20.00    Pack years: 20.00    Types: Cigarettes  . Smokeless tobacco: Never Used  Substance Use Topics  . Alcohol use: Yes    Alcohol/week: 0.0 oz    Comment: occasional  . Drug use: No    Review of Systems  Constitutional: No fever/chills Eyes: No visual changes. ENT: No sore throat. Cardiovascular: Denies chest pain. Respiratory: Denies shortness of breath. Gastrointestinal: No abdominal pain.  No nausea, no vomiting.  No diarrhea.  No constipation. Genitourinary: Negative for dysuria. Musculoskeletal: Negative for back pain. Skin: As above Neurological: Negative for headaches, focal weakness or numbness.   ____________________________________________   PHYSICAL EXAM:  VITAL SIGNS: ED Triage Vitals [03/18/17 1821]  Enc Vitals Group     BP (!) 123/95     Pulse Rate (!) 115     Resp 20     Temp 99.5 F (37.5 C)     Temp Source Oral     SpO2 97 %     Weight 240 lb (108.9 kg)     Height 5' 10"  (1.778 m)     Head Circumference      Peak Flow      Pain Score 8     Pain Loc      Pain Edu?      Excl. in [place]?     Constitutional: Alert and oriented. Well appearing and in no acute distress. Eyes: Conjunctivae are normal.  Head: Atraumatic. Nose: No congestion/rhinnorhea. Mouth/Throat:  Mucous membranes are moist.  Neck: No stridor.   Cardiovascular: Normal rate, regular rhythm. Grossly normal heart sounds.  Good peripheral circulation with equal and bilateral dorsalis pedis pulses. Respiratory: Normal respiratory effort.  No retractions. Lungs CTAB. Gastrointestinal: Soft and nontender. No distention. No CVA tenderness. Musculoskeletal: Right great toe with fluctuant, 2 cm oval-shaped region over the medial aspect over the proximal phalanx.  There is erythema over the entirety of the great toe extending up the medial aspect of the foot to the medial aspect of the right knee.  There is mild tenderness to the right great toe.  No  drainage expressed from the blistered area over the medial great toe.  However, does appear to contain pus.   Neurologic:  Normal speech and language. No gross focal neurologic deficits are appreciated. Skin:  Skin is warm, dry and intact. No rash noted. Psychiatric: Mood and affect are normal. Speech and behavior are normal.  ____________________________________________   LABS (all labs ordered are listed, but only abnormal results are displayed)  Labs Reviewed  LACTIC ACID, PLASMA - Abnormal; Notable for the following components:      Result Value   Lactic Acid, Venous 2.3 (*)    All other components within normal limits  COMPREHENSIVE METABOLIC PANEL - Abnormal; Notable for the following components:   Sodium 134 (*)    Chloride 98 (*)    Glucose, Bld 419 (*)    Calcium 8.8 (*)    ALT 15 (*)    All other components within normal limits  CBC WITH DIFFERENTIAL/PLATELET - Abnormal; Notable for the following components:   WBC 18.8 (*)    Neutro Abs 13.8 (*)    Monocytes Absolute 1.3 (*)    All other components within normal limits  GLUCOSE, CAPILLARY - Abnormal; Notable for the following components:   Glucose-Capillary 383 (*)    All other components within normal limits  CULTURE, BLOOD (ROUTINE X 2)  CULTURE, BLOOD (ROUTINE X 2)  LACTIC  ACID, PLASMA   ____________________________________________  EKG   ____________________________________________  RADIOLOGY  No osseous abnormalities on the right foot x-ray. ____________________________________________   PROCEDURES  Procedure(s) performed:    Marland KitchenMarland KitchenIncision and Drainage Date/Time: 03/18/2017 9:52 PM Performed by: Orbie Pyo, MD Authorized by: Orbie Pyo, MD   Consent:    Consent obtained:  Verbal   Consent given by:  Patient   Risks discussed:  Bleeding, incomplete drainage and infection   Alternatives discussed:  No treatment Location:    Type:  Abscess   Size:  2cm   Location:  Lower extremity   Lower extremity location:  Toe   Toe location:  R big toe Pre-procedure details:    Skin preparation:  Chloraprep Anesthesia (see MAR for exact dosages):    Anesthesia method:  None Procedure type:    Complexity:  Simple Procedure details:    Needle aspiration: yes     Needle size:  18 G   Incision types:  Stab incision   Incision depth:  Dermal   Drainage:  Purulent and serosanguinous   Drainage amount:  Scant   Wound treatment:  Wound left open Post-procedure details:    Patient tolerance of procedure:  Tolerated well, no immediate complications Comments:     With an 18-gauge needle I was able to place a 1 cm gap in the skin of the blister for drainage.    Critical Care performed:   ____________________________________________   INITIAL IMPRESSION / ASSESSMENT AND PLAN / ED COURSE  Pertinent labs & imaging results that were available during my care of the patient were reviewed by me and considered in my medical decision making (see chart for details).  DDX: Sepsis, cellulitis, abscess, osteomyelitis, hyperglycemia As part of my medical decision making, I reviewed the following data within the [facility] Notes from prior ED visits  ----------------------------------------- 9:54 PM on  03/18/2017 -----------------------------------------  Discussed case with Dr. Bjorn Loser of the internal medicine service will be admitting the patient.  Sepsis alert called.  Patient aware of plan for admission to the hospital and willing to comply.  ____________________________________________   FINAL CLINICAL IMPRESSION(S) / ED DIAGNOSES  Right-sided great toe abscess.  Cellulitis of the right lower extremity and foot.    NEW MEDICATIONS STARTED DURING THIS VISIT:  New Prescriptions   No medications on file     Note:  This document was prepared using Dragon voice recognition software and may include unintentional dictation errors.     Orbie Pyo, MD 03/18/17 2155

## 2017-03-18 NOTE — Progress Notes (Signed)
CODE SEPSIS - PHARMACY COMMUNICATION  **Broad Spectrum Antibiotics should be administered within 1 hour of Sepsis diagnosis**  Time Code Sepsis Called/Page Received: no page received  Antibiotics Ordered: vanc/zosyn  Time of 1st antibiotic administration: 2141  Additional action taken by pharmacy:   If necessary, Name of Provider/Nurse Contacted:     Thomasene Ripple ,PharmD Clinical Pharmacist  03/18/2017  9:43 PM

## 2017-03-18 NOTE — ED Notes (Addendum)
Blister to right great toe, pt reports redness starting two days ago, redness/swelling to toe and streaking up pt's lower leg

## 2017-03-18 NOTE — ED Notes (Addendum)
Report called to Wynona Canes, RN and Dr Mayford Knife; Dr Pershing Proud already signed on to see pt; elevated Lactic of 2.3 reported

## 2017-03-18 NOTE — ED Triage Notes (Signed)
Diabetic pt with uncontrolled diabetes.  Small wound to bottom left great toe that is open.  Significant redness/swelling redness to right foot mostly in line with great toe; black color to outside of great toe. Red streak extends from great toe to about knee.  Low grade temp in triage with ST.

## 2017-03-19 LAB — CBC
HEMATOCRIT: 46 % (ref 40.0–52.0)
Hemoglobin: 15.7 g/dL (ref 13.0–18.0)
MCH: 31.9 pg (ref 26.0–34.0)
MCHC: 34.1 g/dL (ref 32.0–36.0)
MCV: 93.7 fL (ref 80.0–100.0)
Platelets: 215 10*3/uL (ref 150–440)
RBC: 4.91 MIL/uL (ref 4.40–5.90)
RDW: 13.1 % (ref 11.5–14.5)
WBC: 13 10*3/uL — ABNORMAL HIGH (ref 3.8–10.6)

## 2017-03-19 LAB — BASIC METABOLIC PANEL
Anion gap: 11 (ref 5–15)
BUN: 9 mg/dL (ref 6–20)
CHLORIDE: 103 mmol/L (ref 101–111)
CO2: 24 mmol/L (ref 22–32)
Calcium: 8.6 mg/dL — ABNORMAL LOW (ref 8.9–10.3)
Creatinine, Ser: 0.68 mg/dL (ref 0.61–1.24)
GFR calc Af Amer: >60 mL/min (ref >60)
GFR calc non Af Amer: >60 mL/min (ref >60)
Glucose, Bld: 364 mg/dL — ABNORMAL HIGH (ref 65–99)
POTASSIUM: 3.4 mmol/L — AB (ref 3.5–5.1)
Sodium: 138 mmol/L (ref 135–145)

## 2017-03-19 LAB — GLUCOSE, CAPILLARY
GLUCOSE-CAPILLARY: 163 mg/dL — AB (ref 65–99)
Glucose-Capillary: 187 mg/dL — ABNORMAL HIGH (ref 65–99)
Glucose-Capillary: 222 mg/dL — ABNORMAL HIGH (ref 65–99)

## 2017-03-19 LAB — VANCOMYCIN, TROUGH: Vancomycin Tr: 7 ug/mL — ABNORMAL LOW (ref 15–20)

## 2017-03-19 MED ORDER — METFORMIN HCL 500 MG PO TABS
1000.0000 mg | ORAL_TABLET | Freq: Two times a day (BID) | ORAL | Status: DC
  Administered 2017-03-19 – 2017-03-21 (×5): 1000 mg via ORAL
  Filled 2017-03-19 (×6): qty 2

## 2017-03-19 MED ORDER — NICOTINE 14 MG/24HR TD PT24
14.0000 mg | MEDICATED_PATCH | Freq: Every day | TRANSDERMAL | Status: DC
  Administered 2017-03-19 – 2017-03-21 (×3): 14 mg via TRANSDERMAL
  Filled 2017-03-19 (×3): qty 1

## 2017-03-19 MED ORDER — LINAGLIPTIN 5 MG PO TABS
5.0000 mg | ORAL_TABLET | Freq: Every day | ORAL | Status: DC
  Administered 2017-03-19 – 2017-03-21 (×3): 5 mg via ORAL
  Filled 2017-03-19 (×3): qty 1

## 2017-03-19 MED ORDER — GLIPIZIDE 5 MG PO TABS
5.0000 mg | ORAL_TABLET | Freq: Two times a day (BID) | ORAL | Status: DC
  Administered 2017-03-19 – 2017-03-21 (×5): 5 mg via ORAL
  Filled 2017-03-19 (×6): qty 1

## 2017-03-19 NOTE — Consult Note (Signed)
ORTHOPAEDIC CONSULTATION  REQUESTING PHYSICIAN: Epifanio Lesches, MD  Chief Complaint: Right great toe infection  HPI: Martin Mullen is a 37 y.o. male who complains of recent right great toe infection.  Noncontrolled diabetic developed blisters on both of his great toes recently.  Left side seem to improve the right side progressively became worse.  Noticed market redness to his right great toe yesterday.  Seen in the ER and admitted.  Elevated white blood cell count was noted and severe redness with an lymphangitic streak on the dorsal aspect of his right foot to the anterior leg.  He states it is tender and sore but typically he has numbness to the area.  Denies any fever or chills though he states he has not felt well this past week.  Past Medical History:  Diagnosis Date  . Acute transmural inferior wall MI ([place]) 08/28/2012   .5 x 12 mm Veri-flex stent non-DES.  Marland Kitchen Chicken pox   . Coronary artery disease    Inferior ST elevation myocardial infarction in July of 2014. Cardiac catheterization showed 95% distal RCA stenosis and 90% first diagonal stenosis with normal ejection fraction. He underwent PCI in bare-metal stent placement to the distal RCA.  . Diabetes mellitus without complication ([place])   . Hypercholesterolemia   . Tobacco use    Past Surgical History:  Procedure Laterality Date  . CORONARY ANGIOPLASTY WITH STENT PLACEMENT    . LEFT HEART CATHETERIZATION WITH CORONARY ANGIOGRAM N/A 08/28/2012   Procedure: LEFT HEART CATHETERIZATION WITH CORONARY ANGIOGRAM;  Surgeon: Laverda Page, MD;  Location: [facility] CATH LAB;  Service: Cardiovascular;  Laterality: N/A;   Social History   Socioeconomic History  . Marital status: Married    Spouse name: None  . Number of children: None  . Years of education: None  . Highest education level: None  Social Needs  . Financial resource strain: None  . Food insecurity - worry: None  . Food insecurity - inability: None  .  Transportation needs - medical: None  . Transportation needs - non-medical: None  Occupational History  . None  Tobacco Use  . Smoking status: Current Every Day Smoker    Packs/day: 1.00    Years: 20.00    Pack years: 20.00    Types: Cigarettes  . Smokeless tobacco: Never Used  Substance and Sexual Activity  . Alcohol use: Yes    Alcohol/week: 0.0 oz    Comment: occasional  . Drug use: No  . Sexual activity: Yes    Birth control/protection: None  Other Topics Concern  . None  Social History Narrative  . None   Family History  Problem Relation Age of Onset  . Arthritis Mother        Rheumatoid  . Diabetes Mother   . Hyperlipidemia Mother   . Diabetes Father   . Cancer Maternal Grandfather        Prostate  . Parkinson's disease Maternal Grandfather    No Known Allergies Prior to Admission medications   Medication Sig Start Date End Date Taking? Authorizing Provider  aspirin 81 MG EC tablet Take 1 tablet (81 mg total) by mouth daily. 09/05/12  Yes Rai, Ripudeep K, MD  lisinopril (PRINIVIL,ZESTRIL) 2.5 MG tablet TAKE ONE TABLET BY MOUTH ONCE DAILY 07/20/16  Yes [name], [name], NP  Blood Glucose Monitoring Suppl (BAYER CONTOUR NEXT MONITOR) w/Device KIT 1 Device 07/22/15   [name], NP  buPROPion (WELLBUTRIN XL) 150 MG 24 hr tablet Take 1 tablet (150  mg total) by mouth daily. 09/26/16   [name], NP  glipiZIDE (GLUCOTROL) 10 MG tablet TAKE ONE TABLET BY MOUTH TWICE DAILY BEFORE MEAL(S) Patient not taking: Reported on 03/18/2017 06/27/16   [name], NP  glucose blood (BAYER CONTOUR NEXT TEST) test strip 1 each by Other route 3 (three) times daily as needed for other. Use as instructed 05/26/16   [name], NP  Insulin Detemir (LEVEMIR) 100 UNIT/ML Pen Inject 20 Units into the skin daily at 10 pm. Patient not taking: Reported on 07/25/2016 04/25/16   [name], NP  Insulin Pen Needle 31G X 6 MM MISC 1 each by Does not apply route daily. 05/05/16   [name], NP  JANUVIA 100 MG tablet TAKE ONE TABLET BY MOUTH ONCE DAILY Patient not taking: Reported on 03/18/2017 08/18/16   [name], NP  metFORMIN (GLUCOPHAGE) 1000 MG tablet TAKE ONE TABLET BY MOUTH TWICE DAILY WITH MEALS Patient not taking: Reported on 03/18/2017 11/17/16   [name], NP  Multiple Vitamins-Minerals (MULTIVITAMIN PO) Take 1 capsule by mouth daily.    [provider]  nitroGLYCERIN (NITROSTAT) 0.4 MG SL tablet Place 1 tablet (0.4 mg total) under the tongue every 5 (five) minutes x 3 doses as needed for chest pain. Patient not taking: Reported on 03/18/2017 11/21/13   [name], NP  Omega-3 Fatty Acids (FISH OIL) 1200 MG CAPS Take 1,200 mg by mouth 3 (three) times daily.    [provider]  simvastatin (ZOCOR) 80 MG tablet Take 1 tablet (80 mg total) by mouth daily. Patient not taking: Reported on 03/18/2017 10/21/16   [name], NP   Dg Foot Complete Right  Result Date: 03/18/2017 CLINICAL DATA:  Blister along the medial aspect of the great toe with pain and swelling. EXAM: RIGHT FOOT COMPLETE - 3+ VIEW COMPARISON:  None. FINDINGS: Slight soft tissue prominence along the medial aspect of the great toe at the base of the distal phalanx. No underlying osseous involvement or bone destruction is seen. No acute fractures noted. There is no joint dislocation no soft tissue emphysema, soft tissue mineralization or mass. IMPRESSION: No acute osseous appearing abnormality of the right foot. Slight soft tissue prominence along the medial aspect of the great toe compatible with history of blister. Electronically Signed   By: Ashley Royalty M.D.   On: 03/18/2017 19:13    Positive ROS: All other systems have been reviewed and were otherwise negative with the exception of those mentioned in the HPI and as above.  12 point ROS was performed.  Physical Exam: General: Alert and oriented.  No apparent distress.  Vascular:  Left foot:Dorsalis Pedis:   present Posterior Tibial:  present  Right foot: Dorsalis Pedis:  present Posterior Tibial:  present  Neuro:absent protective sensation.  Some gross sensation intact.  Derm: Left great toe with superficial ulceration approximately 1.5 cm in diameter under the left great toe.  No signs of infection.  Mild hyperkeratosis surrounding the area.  Right great toe with noted abscess to the medial aspect at the interphalangeal joint region.  Lymphangitic streak to the dorsal aspect of the anterior leg with cellulitis to the midfoot.  Purulence noted upon removal of the superficial skin to the medial right great toe.  Ortho/MS: Diffuse edema to the right foot.  Painful range of motion ankle subtalar metatarsal metatarsophalangeal joints.  Assessment: Abscess with cellulitis right great toe Uncontrolled type II diabetic with neuropathy Diabetic  foot ulceration right great toe Diabetic foot ulceration left great toe  Plan: The left foot we will just monitor and applied a padded bandage to the area.  We will continue to monitor this outpatient as this is stable.  Right foot definitely had an abscess to the area.  An I&D was performed at bedside today.  Wound culture was performed.  Dressing was applied.  Ambulation as tolerated for now though she should remain minimally ambulatory at this time.  Will await wound cultures.  Can change antibiotics as appropriate.  We will follow-up tomorrow.  Procedure note: The right great toe was prepped with alcohol and Betadine.  Next with a 15 blade tissue nippers and scissors I was able to remove the superficial overlying skin with abscessed area.  There was some necrotic tissue Mullen to the subcutaneous region.  This did not probe to bone at this time.  Deep wound culture was performed.  The ulcerative site measured about 2 cm in diameter after removal of the overlying skin the abscessed area was drained appropriately.  Dressing was applied.  We will follow-up  tomorrow.    Elesa Hacker, DPM Cell [phone]   03/19/2017 11:50 AM

## 2017-03-19 NOTE — Progress Notes (Signed)
Patient was taken to surgery via low bed. Consents signed by husband. NSL x 2. TXA and IV ABX sent in chart with patient. Foley in place.

## 2017-03-19 NOTE — Progress Notes (Signed)
CODE SEPSIS - PHARMACY COMMUNICATION  **Broad Spectrum Antibiotics should be administered within 1 hour of Sepsis diagnosis**  Time Code Sepsis Called/Page Received: 21:30  Antibiotics Ordered: Zosyn/Vanc  Time of 1st antibiotic administration: 21:41  Additional action taken by pharmacy:   If necessary, Name of Provider/Nurse Contacted:     Carola Frost ,PharmD Clinical Pharmacist  03/19/2017  10:30 AM

## 2017-03-19 NOTE — H&P (Signed)
[place] [facility] [place] NAME: Martin Mullen    MR#:  [id number]  DATE OF BIRTH:  May 15, 1980  DATE OF ADMISSION:  03/18/2017  PRIMARY CARE PHYSICIAN: [name], NP   REQUESTING/REFERRING PHYSICIAN:   CHIEF COMPLAINT:   Chief Complaint  Patient presents with  . Wound Infection    HISTORY OF PRESENT ILLNESS: Martin Mullen  is a 37 y.o. male with a known history of coronary artery disease, uncontrolled diabetes type 2, hyperlipidemia and tobacco abuse. Patient came to emergency room for right great toe swelling, redness and tenderness going on for the past 3-4 days, gradually getting worse.  Initially, 5 days ago, the patient developed blisters at both great toes from wearing uncomfortable boots, which were 2 sizes bigger.  While the blister from the left big toe healed, the one on the right big toe complicated with abscess and cellulitis.  Per patient, there was some yellow discharge, earlier at home from the right toe abscess. He denies having any fever or chills at home.  Blood test in the emergency room are remarkable for elevated WBC at 18,800; lactic acid is elevated at 2.3.  Blood sugar is elevated in 400s. Right foot x-ray is negative for bony abnormalities, at this time. Patient admits to being noncompliant with his medications as he cannot afford to buy them.  Patient is admitted for further evaluation and treatment.  PAST MEDICAL HISTORY:   Past Medical History:  Diagnosis Date  . Acute transmural inferior wall MI ([place]) 08/28/2012   .5 x 12 mm Veri-flex stent non-DES.  Marland Kitchen Chicken pox   . Coronary artery disease    Inferior ST elevation myocardial infarction in July of 2014. Cardiac catheterization showed 95% distal RCA stenosis and 90% first diagonal stenosis with normal ejection fraction. He underwent PCI in bare-metal stent placement to the distal RCA.  . Diabetes mellitus without complication ([place])   . Hypercholesterolemia    . Tobacco use     PAST SURGICAL HISTORY:  Past Surgical History:  Procedure Laterality Date  . CORONARY ANGIOPLASTY WITH STENT PLACEMENT    . LEFT HEART CATHETERIZATION WITH CORONARY ANGIOGRAM N/A 08/28/2012   Procedure: LEFT HEART CATHETERIZATION WITH CORONARY ANGIOGRAM;  Surgeon: Laverda Page, MD;  Location: [facility] CATH LAB;  Service: Cardiovascular;  Laterality: N/A;    SOCIAL HISTORY:  Social History   Tobacco Use  . Smoking status: Current Every Day Smoker    Packs/day: 1.00    Years: 20.00    Pack years: 20.00    Types: Cigarettes  . Smokeless tobacco: Never Used  Substance Use Topics  . Alcohol use: Yes    Alcohol/week: 0.0 oz    Comment: occasional    FAMILY HISTORY:  Family History  Problem Relation Age of Onset  . Arthritis Mother        Rheumatoid  . Diabetes Mother   . Hyperlipidemia Mother   . Diabetes Father   . Cancer Maternal Grandfather        Prostate  . Parkinson's disease Maternal Grandfather     DRUG ALLERGIES: No Known Allergies  REVIEW OF SYSTEMS:   CONSTITUTIONAL: No fever, fatigue or weakness.  EYES: No blurred or double vision.  EARS, NOSE, AND THROAT: No tinnitus or ear pain.  RESPIRATORY: No cough, shortness of breath, wheezing or hemoptysis.  CARDIOVASCULAR: No chest pain, orthopnea, edema.  GASTROINTESTINAL: No nausea, vomiting, diarrhea or abdominal pain.  GENITOURINARY: No dysuria, hematuria.  ENDOCRINE:  No polyuria, nocturia,  HEMATOLOGY: No bleeding SKIN: Right great toe is swollen red and very tender to touch.  The redness and the swelling goes all the way up to the ankle. MUSCULOSKELETAL: Positive for right big toe swelling, redness and tenderness.Marland Kitchen   NEUROLOGIC: No focal weakness.  There is reduced sensation in bilateral feet due to peripheral neuropathy. PSYCHIATRY: No anxiety or depression.   MEDICATIONS AT HOME:  Prior to Admission medications   Medication Sig Start Date End Date Taking? Authorizing Provider   aspirin 81 MG EC tablet Take 1 tablet (81 mg total) by mouth daily. 09/05/12  Yes Rai, Ripudeep K, MD  lisinopril (PRINIVIL,ZESTRIL) 2.5 MG tablet TAKE ONE TABLET BY MOUTH ONCE DAILY 07/20/16  Yes [name], NP  Blood Glucose Monitoring Suppl (BAYER CONTOUR NEXT MONITOR) w/Device KIT 1 Device 07/22/15   [name], NP  buPROPion (WELLBUTRIN XL) 150 MG 24 hr tablet Take 1 tablet (150 mg total) by mouth daily. 09/26/16   [name], NP  glipiZIDE (GLUCOTROL) 10 MG tablet TAKE ONE TABLET BY MOUTH TWICE DAILY BEFORE MEAL(S) Patient not taking: Reported on 03/18/2017 06/27/16   [name], NP  glucose blood (BAYER CONTOUR NEXT TEST) test strip 1 each by Other route 3 (three) times daily as needed for other. Use as instructed 05/26/16   [name], NP  Insulin Detemir (LEVEMIR) 100 UNIT/ML Pen Inject 20 Units into the skin daily at 10 pm. Patient not taking: Reported on 07/25/2016 04/25/16   [name], NP  Insulin Pen Needle 31G X 6 MM MISC 1 each by Does not apply route daily. 05/05/16   [name], NP  JANUVIA 100 MG tablet TAKE ONE TABLET BY MOUTH ONCE DAILY Patient not taking: Reported on 03/18/2017 08/18/16   [name], NP  metFORMIN (GLUCOPHAGE) 1000 MG tablet TAKE ONE TABLET BY MOUTH TWICE DAILY WITH MEALS Patient not taking: Reported on 03/18/2017 11/17/16   [name], NP  Multiple Vitamins-Minerals (MULTIVITAMIN PO) Take 1 capsule by mouth daily.    [provider]  nitroGLYCERIN (NITROSTAT) 0.4 MG SL tablet Place 1 tablet (0.4 mg total) under the tongue every 5 (five) minutes x 3 doses as needed for chest pain. Patient not taking: Reported on 03/18/2017 11/21/13   [name], NP  Omega-3 Fatty Acids (FISH OIL) 1200 MG CAPS Take 1,200 mg by mouth 3 (three) times daily.    [provider]  simvastatin (ZOCOR) 80 MG tablet Take 1 tablet (80 mg total) by mouth daily. Patient not taking: Reported on 03/18/2017 10/21/16   [name], NP       PHYSICAL EXAMINATION:   VITAL SIGNS: Blood pressure 118/81, pulse 93, temperature 99 F (37.2 C), temperature source Oral, resp. rate 19, height 5' 10"  (1.778 m), weight 104.9 kg (231 lb 3.2 oz), SpO2 96 %.  GENERAL:  37 y.o.-year-old patient lying in the bed with no acute distress.  EYES: Pupils equal, round, reactive to light and accommodation. No scleral icterus. Extraocular muscles intact.  HEENT: Head atraumatic, normocephalic. Oropharynx and nasopharynx clear.  NECK:  Supple, no jugular venous distention. No thyroid enlargement, no tenderness.  LUNGS: Normal breath sounds bilaterally, no wheezing, rales,rhonchi or crepitation. No use of accessory muscles of respiration.  CARDIOVASCULAR: S1, S2 normal. No murmurs, rubs, or gallops.  ABDOMEN: Soft, nontender, nondistended. Bowel sounds present. No organomegaly or mass.  EXTREMITIES: Right great toe is noted with fluctuant, 2 cm oval-shaped abscess  over the medial aspect.  Erythema and edema expand from the right great toe all the way to the ankle.  The whole right foot is warm and tender to touch. There is currently no bleeding or discharge noted. NEUROLOGIC: No focal weakness. There is reduced sensation in bilateral feet due to peripheral neuropathy. Gait not checked.  PSYCHIATRIC: The patient is alert and oriented x 3.  SKIN:  Right great toe is noted with fluctuant, 2 cm oval-shaped abscess over the medial aspect.  Erythema and edema expand from the right great toe all the way to the ankle.  The whole right foot is warm and tender to touch. There is currently no bleeding or discharge noted.    LABORATORY PANEL:   CBC Recent Labs  Lab 03/18/17 1823  WBC 18.8*  HGB 16.5  HCT 48.0  PLT 250  MCV 93.0  MCH 31.9  MCHC 34.3  RDW 13.1  LYMPHSABS 3.4  MONOABS 1.3*  EOSABS 0.2  BASOSABS 0.1   ------------------------------------------------------------------------------------------------------------------  Chemistries  Recent  Labs  Lab 03/18/17 1823  NA 134*  K 3.8  CL 98*  CO2 24  GLUCOSE 419*  BUN 9  CREATININE 0.82  CALCIUM 8.8*  AST 18  ALT 15*  ALKPHOS 101  BILITOT 0.5   ------------------------------------------------------------------------------------------------------------------ estimated creatinine clearance is 151.1 mL/min (by C-G formula based on SCr of 0.82 mg/dL). ------------------------------------------------------------------------------------------------------------------ No results for input(s): TSH, T4TOTAL, T3FREE, THYROIDAB in the last 72 hours.  Invalid input(s): FREET3   Coagulation profile No results for input(s): INR, PROTIME in the last 168 hours. ------------------------------------------------------------------------------------------------------------------- No results for input(s): DDIMER in the last 72 hours. -------------------------------------------------------------------------------------------------------------------  Cardiac Enzymes No results for input(s): CKMB, TROPONINI, MYOGLOBIN in the last 168 hours.  Invalid input(s): CK ------------------------------------------------------------------------------------------------------------------ Invalid input(s): POCBNP  ---------------------------------------------------------------------------------------------------------------  Urinalysis No results found for: COLORURINE, APPEARANCEUR, LABSPEC, PHURINE, GLUCOSEU, HGBUR, BILIRUBINUR, KETONESUR, PROTEINUR, UROBILINOGEN, NITRITE, LEUKOCYTESUR   RADIOLOGY: Dg Foot Complete Right  Result Date: 03/18/2017 CLINICAL DATA:  Blister along the medial aspect of the great toe with pain and swelling. EXAM: RIGHT FOOT COMPLETE - 3+ VIEW COMPARISON:  None. FINDINGS: Slight soft tissue prominence along the medial aspect of the great toe at the base of the distal phalanx. No underlying osseous involvement or bone destruction is seen. No acute fractures noted. There is no  joint dislocation no soft tissue emphysema, soft tissue mineralization or mass. IMPRESSION: No acute osseous appearing abnormality of the right foot. Slight soft tissue prominence along the medial aspect of the great toe compatible with history of blister. Electronically Signed   By: Ashley Royalty M.D.   On: 03/18/2017 19:13    EKG: Orders placed or performed in visit on 07/25/16  . EKG 12-Lead    IMPRESSION AND PLAN:  1. Sepsis, likely sec to RT toe abscess and RT foot cellulitis. Will start broad coverage abx therapy and fluid resuscitation 2. RT great toe abscess. Will start Zosyn and Vancomycin. Podiatry consulted to further eval the pt.  3. Right foot cellulitis, see treatment above under #2. 4. Uncontrolled diabetes type 2.  Restarted home medication.  Will monitor blood sugars before meals and at bedtime. Patient states he is not compliant with his medications because he cannot afford to buy them.  Case manager/social worker consulted. 5. Tobacco abuse.  Smoking cessation was discussed with patient in detail.  Will start nicotine gum for now.  All the records are reviewed and case discussed with ED provider. Management plans discussed with the patient, family  and they are in agreement.  CODE STATUS:    Code Status Orders  (From admission, onward)        Start     Ordered   03/18/17 2307  Full code  Continuous     03/18/17 2306    Code Status History    Date Active Date Inactive Code Status Order ID Comments User Context   03/05/2015 16:16 03/06/2015 18:34 Full Code [id number]  Aldean Jewett, MD Inpatient   08/28/2012 10:38 08/30/2012 14:29 Full Code [id number]  Laverda Page, MD Inpatient       TOTAL TIME TAKING CARE OF THIS PATIENT: 45 minutes.    Amelia Jo M.D on 03/19/2017 at 1:25 AM  Between 7am to 6pm - Pager - [phone]  After 6pm go to [email or web address] - password [email or web address] [place]  Office  [phone]  CC: Primary care  physician; [name], NP

## 2017-03-19 NOTE — Progress Notes (Signed)
Went to give meds and patient was not in room. IV ABX were disconnected and hanging on the pole. Called Security. Found patient off the floor smelling like smoke. Talked with patient about smoking policy and got a nicotine patch ordered.

## 2017-03-19 NOTE — Progress Notes (Addendum)
Seen at bedside, admitted this morning for sepsis/ right great toe infection due to uncontrolled diabetes.  Started on IV antibiotics. Labs, physical exam done. Vitals temperature 98.37F BP 106/71, heart rate 93, 100% on room air. #1.Sepsis due to  right great toe cellulitis: On IV antibiotics, uncontrolled diabetes with neuropathy, patient has right foot abscess, I&D is done by podiatry today, cultures were sent.,:  2 diabetes mellitus: Uncontrolled: Continue Tradjenta, metformin, continue SSI with coverage, blood sugar improved from 350s-163.   Time spent around 20 minutes.

## 2017-03-20 LAB — GLUCOSE, CAPILLARY
GLUCOSE-CAPILLARY: 170 mg/dL — AB (ref 65–99)
GLUCOSE-CAPILLARY: 265 mg/dL — AB (ref 65–99)
Glucose-Capillary: 311 mg/dL — ABNORMAL HIGH (ref 65–99)
Glucose-Capillary: 323 mg/dL — ABNORMAL HIGH (ref 65–99)
Glucose-Capillary: 178 mg/dL — ABNORMAL HIGH (ref 65–99)

## 2017-03-20 LAB — HEMOGLOBIN A1C
Hgb A1c MFr Bld: 11.3 % — ABNORMAL HIGH (ref 4.8–5.6)
Mean Plasma Glucose: 277.61 mg/dL

## 2017-03-20 LAB — VANCOMYCIN, TROUGH: VANCOMYCIN TR: 10 ug/mL — AB (ref 15–20)

## 2017-03-20 MED ORDER — MUPIROCIN CALCIUM 2 % EX CREA
TOPICAL_CREAM | Freq: Every day | CUTANEOUS | Status: DC
  Administered 2017-03-20 – 2017-03-21 (×2): via TOPICAL
  Filled 2017-03-20: qty 15

## 2017-03-20 MED ORDER — ADULT MULTIVITAMIN W/MINERALS CH
1.0000 | ORAL_TABLET | Freq: Every day | ORAL | Status: DC
  Administered 2017-03-20 – 2017-03-21 (×2): 1 via ORAL
  Filled 2017-03-20 (×2): qty 1

## 2017-03-20 MED ORDER — INSULIN DETEMIR 100 UNIT/ML ~~LOC~~ SOLN
20.0000 [IU] | Freq: Every day | SUBCUTANEOUS | Status: DC
  Administered 2017-03-20 – 2017-03-21 (×2): 20 [IU] via SUBCUTANEOUS
  Filled 2017-03-20 (×2): qty 0.2

## 2017-03-20 MED ORDER — VANCOMYCIN HCL 10 G IV SOLR
1500.0000 mg | Freq: Three times a day (TID) | INTRAVENOUS | Status: DC
  Administered 2017-03-20 – 2017-03-21 (×3): 1500 mg via INTRAVENOUS
  Filled 2017-03-20 (×5): qty 1500

## 2017-03-20 NOTE — Progress Notes (Addendum)
Daily Progress Note   Subjective  - * No surgery found *  Right foot infection.  Objective Vitals:   03/19/17 1450 03/19/17 1935 03/20/17 0618 03/20/17 0746  BP: 127/83 122/75 127/88 116/81  Pulse: 100 (!) 101 95 87  Resp: 18   16  Temp: 98.7 F (37.1 C) 99 F (37.2 C) 97.6 F (36.4 C) 98.3 F (36.8 C)  TempSrc: Oral Oral Oral Oral  SpO2: 96% 96% 99% 99%  Weight:   104.7 kg (230 lb 12.8 oz)   Height:        Physical Exam: Improved.  Erythema decreased.  No purulence today.  Laboratory CBC    Component Value Date/Time   WBC 13.0 (H) 03/19/2017 0325   HGB 15.7 03/19/2017 0325   HGB 16.4 01/29/2013 0040   HCT 46.0 03/19/2017 0325   HCT 48.2 01/29/2013 0040   PLT 215 03/19/2017 0325   PLT 222 01/29/2013 0040    BMET    Component Value Date/Time   NA 138 03/19/2017 0325   NA 139 01/29/2013 0040   K 3.4 (L) 03/19/2017 0325   K 4.0 01/29/2013 0040   CL 103 03/19/2017 0325   CL 107 01/29/2013 0040   CO2 24 03/19/2017 0325   CO2 28 01/29/2013 0040   GLUCOSE 364 (H) 03/19/2017 0325   GLUCOSE 228 (H) 01/29/2013 0040   BUN 9 03/19/2017 0325   BUN 15 01/29/2013 0040   CREATININE 0.68 03/19/2017 0325   CREATININE 0.88 01/29/2013 0040   CALCIUM 8.6 (L) 03/19/2017 0325   CALCIUM 9.2 01/29/2013 0040   GFRNONAA >60 03/19/2017 0325   GFRNONAA >60 01/29/2013 0040   GFRAA >60 03/19/2017 0325   GFRAA >60 01/29/2013 0040    Assessment/Planning: Abscess right great toe    Culture growing Staph.  Awaiting sensitivities.  Dressing changed.  D/W pt likely d/c tomorrow from podiatry standpoint.  Hopefully sensitivities available.  Can clean daily with soap and water.  Apply mupiricin to wound daily and cover with gauze.  C/W post op shoes b/l.    Gwyneth Revels A  03/20/2017, 4:55 PM

## 2017-03-20 NOTE — Progress Notes (Signed)
Pharmacy Antibiotic Note  Terreon Sailor is a 37 y.o. male admitted on 03/18/2017 with sepsis.  Pharmacy has been consulted for vanc/zosyn dosing.  Plan: Patient received vanc 1g and zosyn 3.375g IV x 1 in ED Will continue vanc 1g IV q8h w/ 6 hour stack and check VT 0203 @0500  prior to 4th dose w/ am labs. Will continue zosyn 3.375g IV q8h via EI  Ke 0.108 (maxed CrCl @ 125 ml/min d/t overestimation in obese patients) T1/2 6 ~ 8 hrs  Goal trough 15 - 20 mcg/mL  02/04 @ 0530 VT 10 subtherapeutic. Will increase dose to vanc 1.5g IV q8h for estimated Css of 15 mcg/mL, will check a trough 02/05 @ 1300 prior to 4th dose. renal function stable.  Height: 5\' 10"  (177.8 cm) Weight: 231 lb 3.2 oz (104.9 kg) IBW/kg (Calculated) : 73  Temp (24hrs), Avg:98.9 F (37.2 C), Min:98.7 F (37.1 C), Max:99 F (37.2 C)  Recent Labs  Lab 03/18/17 1823 03/18/17 2028 03/19/17 0325 03/20/17 0516  WBC 18.8*  --  13.0*  --   CREATININE 0.82  --  0.68  --   LATICACIDVEN 2.3* 1.6  --   --   VANCOTROUGH  --   --  7* 10*    Estimated Creatinine Clearance: 154.9 mL/min (by C-G formula based on SCr of 0.68 mg/dL).    No Known Allergies  Thank you for allowing pharmacy to be a part of this patient's care.  Thomasene Ripple, PharmD, BCPS Clinical Pharmacist 03/20/2017

## 2017-03-20 NOTE — Progress Notes (Signed)
[facility] [place] at [facility]   PATIENT NAME: Martin Mullen    MR#:  [id number]  DATE OF BIRTH:  12-09-1980  SUBJECTIVE: Patient seen at bedside, he says he feels little better than yesterday, however blood sugar is still high around 300 this morning.  CHIEF COMPLAINT:   Chief Complaint  Patient presents with  . Wound Infection    REVIEW OF SYSTEMS:   ROS CONSTITUTIONAL: No fever, fatigue or weakness.  EYES: No blurred or double vision.  EARS, NOSE, AND THROAT: No tinnitus or ear pain.  RESPIRATORY: No cough, shortness of breath, wheezing or hemoptysis.  CARDIOVASCULAR: No chest pain, orthopnea, edema.  GASTROINTESTINAL: No nausea, vomiting, diarrhea or abdominal pain.  GENITOURINARY: No dysuria, hematuria.  ENDOCRINE: No polyuria, nocturia,  HEMATOLOGY: No anemia, easy bruising or bleeding SKIN: No rash or lesion. MUSCULOSKELETAL: No joint pain or arthritis.  Patient has dressing present on the right great toe.  Also has dressing and bandage for the left great toe as well. NEUROLOGIC: No tingling, numbness, weakness.  PSYCHIATRY: No anxiety or depression.   DRUG ALLERGIES:  No Known Allergies  VITALS:  Blood pressure 130/80, pulse 92, temperature 98.3 F (36.8 C), temperature source Oral, resp. rate 16, height 5\' 10"  (1.778 m), weight 104.7 kg (230 lb 12.8 oz), SpO2 97 %.  PHYSICAL EXAMINATION:  GENERAL:  37 y.o.-year-old patient lying in the bed with no acute distress.  EYES: Pupils equal, round, reactive to light . No scleral icterus. Extraocular muscles intact.  HEENT: Head atraumatic, normocephalic. Oropharynx and nasopharynx clear.  NECK:  Supple, no jugular venous distention. No thyroid enlargement, no tenderness.  LUNGS: Normal breath sounds bilaterally, no wheezing, rales,rhonchi or crepitation. No use of accessory muscles of respiration.  CARDIOVASCULAR: S1, S2 normal. No murmurs, rubs, or gallops.  ABDOMEN: Soft, nontender,  nondistended. Bowel sounds present. No organomegaly or mass.  EXTREMITIES: No pedal edema, cyanosis, or clubbing.  NEUROLOGIC: Cranial nerves II through XII are intact. Muscle strength 5/5 in all extremities. Sensation intact. Gait not checked.  PSYCHIATRIC: The patient is alert and oriented x 3.  SKIN: Patient has dressing present over the right foot great toe, left great toe also.   LABORATORY PANEL:   CBC Recent Labs  Lab 03/19/17 0325  WBC 13.0*  HGB 15.7  HCT 46.0  PLT 215   ------------------------------------------------------------------------------------------------------------------  Chemistries  Recent Labs  Lab 03/18/17 1823 03/19/17 0325  NA 134* 138  K 3.8 3.4*  CL 98* 103  CO2 24 24  GLUCOSE 419* 364*  BUN 9 9  CREATININE 0.82 0.68  CALCIUM 8.8* 8.6*  AST 18  --   ALT 15*  --   ALKPHOS 101  --   BILITOT 0.5  --    ------------------------------------------------------------------------------------------------------------------  Cardiac Enzymes No results for input(s): TROPONINI in the last 168 hours. ------------------------------------------------------------------------------------------------------------------  RADIOLOGY:  Dg Foot Complete Right  Result Date: 03/18/2017 CLINICAL DATA:  Blister along the medial aspect of the great toe with pain and swelling. EXAM: RIGHT FOOT COMPLETE - 3+ VIEW COMPARISON:  None. FINDINGS: Slight soft tissue prominence along the medial aspect of the great toe at the base of the distal phalanx. No underlying osseous involvement or bone destruction is seen. No acute fractures noted. There is no joint dislocation no soft tissue emphysema, soft tissue mineralization or mass. IMPRESSION: No acute osseous appearing abnormality of the right foot. Slight soft tissue prominence along the medial aspect of the great toe compatible with history of  blister. Electronically Signed   By: Tollie Eth M.D.   On: 03/18/2017 19:13    EKG:    Orders placed or performed in visit on 07/25/16  . EKG 12-Lead    ASSESSMENT AND PLAN:   Sepsis present on admission secondary to right great toe infection status post incision and drainage of right great toe abscess by podiatry yesterday, patient is on empiric vancomycin, Zosyn, WBC is coming down, dis continue IV fluids as patient p.o. intake is good.  Cultures are showing staph aureus but the final sensitivities are pending.  Likely discharge tomorrow, seen by podiatry, recommended to apply the mupirocin daily to the right great toe, patient also need to wear postop shoe bilaterally. #2 diabetes mellitus type 2: Uncontrolled: Check hemoglobin A1c, seen by diabetic nurse, started on basal insulin, continue glipizide, Tradjenta, metformin, case manager consult to assist with medications at discharge. 3.  Essential hypertension: Controlled   All the records are reviewed and case discussed with Care Management/Social Workerr. Management plans discussed with the patient, family and they are in agreement.  CODE STATUS: Full code  TOTAL TIME TAKING CARE OF THIS PATIENT: 35 minutes.   POSSIBLE D/C IN 1 DAY DEPENDING ON CLINICAL CONDITION.   Katha Hamming M.D on 03/20/2017 at 5:27 PM  Between 7am to 6pm - Pager - [phone]  After 6pm go to [email or web address] - password [email or web address] [facility]  [place]  [facility]  Office  [phone]  CC: Primary care physician; [name], NP   Note: This dictation was prepared with Dragon dictation along with smaller phrase technology. Any transcriptional errors that result from this process are unintentional.

## 2017-03-20 NOTE — Progress Notes (Signed)
Right great toe dressing changed today,bactroban ordered for wound care,seen by diabetic nurse and meds adjusted,iv antibiotics continue,vital signs within normal limits.

## 2017-03-20 NOTE — Progress Notes (Addendum)
Inpatient Diabetes Program Recommendations  AACE/ADA: New Consensus Statement on Inpatient Glycemic Control (2015)  Target Ranges:  Prepandial:   less than 140 mg/dL      Peak postprandial:   less than 180 mg/dL (1-2 hours)      Critically ill patients:  140 - 180 mg/dL   Lab Results  Component Value Date   GLUCAP 170 (H) 03/20/2017   HGBA1C 7.2 (H) 07/25/2016    Review of Glycemic ControlResults for Martin Mullen, Martin Mullen (MRN [id number]) as of 03/20/2017 13:34  Ref. Range 03/19/2017 11:57 03/19/2017 17:01 03/19/2017 21:04 03/20/2017 08:35 03/20/2017 12:23  Glucose-Capillary Latest Ref Range: 65 - 99 mg/dL 353 (H) 614 (H) 431 (H) 323 (H) 170 (H)    Diabetes history: Type 2 DM Outpatient Diabetes medications: Glipizide 10 mg bid, Levemir 20 units q HS (not taking), Januvia 100 mg daily (not taking), Metformin 1000 mg bid (not taking) Current orders for Inpatient glycemic control:  Novolog sensitive tid with meals and HS, Glucotrol 5 mg bid, Tradjenta 5 mg daily, Metformin 1000 mg bid Inpatient Diabetes Program Recommendations:    Please order A1C to determine pre-hospitalization glycemic control.  Also please restart patient's basal insulin.  Consider Levemir 20 units daily.  Will follow.  Note that patient states he cannot afford medication.  Case management consult pending.   Thanks,  Beryl Meager, RN, BC-ADM Inpatient Diabetes Coordinator Pager [phone] (8a-5p)  Addendum: 3:45 pm- Spoke with patient and wife.  He does not take insulin at home and states that he has only been taking Metformin 1000 mg once a day prior to admit.  His A1C in June of 2018 was 7.2% however patient states that he thinks it is worse because he has not been taking care of himself.  He found out he had diabetes 4 years ago when he had a MI.  He states that his wife carries insurance on him however the co-pays for the medications can be up to 160$ a month.  Discussed basal insulin option and he is agreeable. Will follow up  on 03/21/17.  He also may benefit from the use of co-pay card to help with insulin costs.

## 2017-03-21 LAB — HIV ANTIBODY (ROUTINE TESTING W REFLEX): HIV Screen 4th Generation wRfx: NONREACTIVE

## 2017-03-21 LAB — GLUCOSE, CAPILLARY
GLUCOSE-CAPILLARY: 230 mg/dL — AB (ref 65–99)
GLUCOSE-CAPILLARY: 203 mg/dL — AB (ref 65–99)

## 2017-03-21 MED ORDER — INSULIN ASPART 100 UNIT/ML ~~LOC~~ SOLN: 0.0000 [IU] | Freq: Three times a day (TID) | SUBCUTANEOUS | 11 refills | Status: DC

## 2017-03-21 MED ORDER — HYDROCODONE-ACETAMINOPHEN 5-325 MG PO TABS: 1.0000 | ORAL_TABLET | ORAL | 0 refills | Status: DC | PRN

## 2017-03-21 MED ORDER — INFLUENZA VAC SPLIT QUAD 0.5 ML IM SUSY: 0.5000 mL | PREFILLED_SYRINGE | INTRAMUSCULAR | Status: DC

## 2017-03-21 MED ORDER — INSULIN DETEMIR 100 UNIT/ML ~~LOC~~ SOLN: 20.0000 [IU] | Freq: Every day | SUBCUTANEOUS | 11 refills | Status: DC

## 2017-03-21 MED ORDER — MUPIROCIN CALCIUM 2 % EX CREA: TOPICAL_CREAM | Freq: Every day | CUTANEOUS | 0 refills | Status: DC

## 2017-03-21 MED ORDER — AMOXICILLIN-POT CLAVULANATE 875-125 MG PO TABS: 1.0000 | ORAL_TABLET | Freq: Two times a day (BID) | ORAL | 0 refills | Status: AC

## 2017-03-21 MED ORDER — GLIPIZIDE 5 MG PO TABS: 5.0000 mg | ORAL_TABLET | Freq: Two times a day (BID) | ORAL | 0 refills | Status: DC

## 2017-03-21 MED ORDER — INSULIN DETEMIR 100 UNIT/ML ~~LOC~~ SOLN: 23.0000 [IU] | Freq: Every day | SUBCUTANEOUS | 11 refills | Status: DC

## 2017-03-21 MED ORDER — INSULIN ASPART 100 UNIT/ML ~~LOC~~ SOLN: 0.0000 [IU] | Freq: Every day | SUBCUTANEOUS | 11 refills | Status: DC

## 2017-03-21 MED ORDER — LINAGLIPTIN 5 MG PO TABS: 5.0000 mg | ORAL_TABLET | Freq: Every day | ORAL | 0 refills | Status: DC

## 2017-03-21 MED ORDER — METFORMIN HCL 1000 MG PO TABS: 1000.0000 mg | ORAL_TABLET | Freq: Two times a day (BID) | ORAL | 0 refills | Status: DC

## 2017-03-21 NOTE — Progress Notes (Signed)
Discharge instructions and med details reviewed with patient. All questions answered. Patient verbalizes understanding. Printed prescriptions given to patient along with printed AVS. IV removed. Patient has bilateral post-op shoes. Patient escorted out via wheelchair.

## 2017-03-21 NOTE — Progress Notes (Addendum)
Inpatient Diabetes Program Recommendations  AACE/ADA: New Consensus Statement on Inpatient Glycemic Control (2015)  Target Ranges:  Prepandial:   less than 140 mg/dL      Peak postprandial:   less than 180 mg/dL (1-2 hours)      Critically ill patients:  140 - 180 mg/dL   Lab Results  Component Value Date   GLUCAP 203 (H) 03/21/2017   HGBA1C 11.3 (H) 03/20/2017    Note plans for patient to d/c home today.  Please consider d/c of Novolog correction and Tradjenta due to cost-text paged MD x2.  Therefore recommend Glucotrol, Metformin and Levemir at d/c.  Taught patient to use insulin pen and he demonstrated use.  We discussed applying needle, 2 unit prime, Dialing dose, injection and holding in place for 10 seconds with injection.  Also gave patient Levemir co-pay card to ensure that co-pay is 25$. Patient verbalized and demonstrated understanding.   Thanks,  Beryl Meager, RN, BC-ADM Inpatient Diabetes Coordinator Pager [phone] (8a-5p)

## 2017-03-21 NOTE — Progress Notes (Addendum)
Discharge  home today, instructions are in the computer, called microbiology and confirmed that patient has only staph aureus from the right great toe abscess drainage so I called in for Augmentin for 2 weeks, patient advised to keep appointment with Dr. Ether Griffins.  Wrote prescriptions for metformin, insulin, Levemir, adjusted the dose of Levemir, all the prescriptions are printed for that he can get medication management help.

## 2017-03-21 NOTE — Care Management Note (Signed)
Case Management Note  Patient Details  Name: Antrell Abraha MRN: [id number] Date of Birth: 1980/07/30  Subjective/Objective:   Admitted to [facility] with the diagnosis of sepsis. Lives with parents. Separated from wife. Last seen Dr. Sampson Si June 2018. Prescriptions are filled at [facility] on [address]laid off his job in December 2018. No income. Lives in [place].   States he is still on his [employer] plan, but has no drug coverage.                Action/Plan: Received referral for help with medications. Application to Medication Management given   Expected Discharge Date:                  Expected Discharge Plan:     In-House Referral:   yes  Discharge planning Services   yes  Post Acute Care Choice:    Choice offered to:     DME Arranged:    DME Agency:     HH Arranged:    HH Agency:     Status of Service:     If discussed at [employer], dates discussed:    Additional Comments:  Gwenette Greet, RN MSN CCM Care Management [phone] 03/21/2017, 8:45 AM

## 2017-03-23 LAB — CULTURE, BLOOD (ROUTINE X 2)
Culture: NO GROWTH
Culture: NO GROWTH
SPECIAL REQUESTS: ADEQUATE
Special Requests: ADEQUATE

## 2017-03-25 NOTE — Discharge Summary (Signed)
Martin Mullen, is a 37 y.o. male  DOB 07-Oct-1980  MRN [id number].  Admission date:  03/18/2017  Admitting Physician  Amelia Jo, MD  Discharge Date:  03/21/2017   Primary MD  [name], NP  Recommendations for primary care physician for things to follow:   Follow-up with PCP in 1 week Follow-up with podiatry in 2 weeks   Admission Diagnosis  Abscess [L02.91] Cellulitis of right lower extremity [L03.115]   Discharge Diagnosis  Abscess [L02.91] Cellulitis of right lower extremity [L03.115]    Active Problems:   Sepsis [facility])      Past Medical History:  Diagnosis Date  . Acute transmural inferior wall MI ([place]) 08/28/2012   .5 x 12 mm Veri-flex stent non-DES.  Marland Kitchen Chicken pox   . Coronary artery disease    Inferior ST elevation myocardial infarction in July of 2014. Cardiac catheterization showed 95% distal RCA stenosis and 90% first diagonal stenosis with normal ejection fraction. He underwent PCI in bare-metal stent placement to the distal RCA.  . Diabetes mellitus without complication ([place])   . Hypercholesterolemia   . Tobacco use     Past Surgical History:  Procedure Laterality Date  . CORONARY ANGIOPLASTY WITH STENT PLACEMENT    . LEFT HEART CATHETERIZATION WITH CORONARY ANGIOGRAM N/A 08/28/2012   Procedure: LEFT HEART CATHETERIZATION WITH CORONARY ANGIOGRAM;  Surgeon: Laverda Page, MD;  Location[facility] C[facility] LAB;  Service: Cardiovascular;  Laterality: N/A;       History of present illness and  Hospital Course:     Kindly see H&P for history of present illness and admission details, please review complete Labs, Consult reports and Test reports for all details in brief  HPI  from the history and physical done on the day of admission 37 year old male patient with history of CAD, status post PCI,  uncontrolled diabetes mellitus comes in because of right great toe swelling associated with drainage, pain, chills at home.  Admitted to hospital for sepsis due to right foot cellulitis, right great toe abscess.   Hospital Course  #1 .sepsis secondary to right great toe abscess: Seen by Dr. Vickki Muff from podiatry.  Started on IV antibiotics with vancomycin, Zosyn, patient had bedside incision and drainage of right great toe abscess, cultures were sent to microbiology.  Patient edema, pain improved after drainage of abscess, cultures showed staph aureus, discharged home with Augmentin for 2 weeks, advised to follow-up with Dr. Vickki Muff from podiatry.  #2. diabetes mellitus type 2: Uncontrolled.  Seen by diabetic nurse, patient will be on Glucotrol, metformin, Levemir, start the patient how to use insulin.  We discontinued Tradjenta at discharge due to cost.  Case manager told me that  patient has prescription plan and he does not need help with medication management.  #3 essential hypertension: Controlled  4.  History of CAD status post PCI: Patient takes aspirin, statin, lisinopril, patient had stent 5 years ago.  So he is not taking Plavix anymore, followed up with Dr. Fletcher Anon..  Patient needs aspirin indefinitely. Patient is advised to quit smoking.  Discharge Condition: Stable   Follow UP  Follow-up Information    Samara Deist, DPM. Schedule an appointment as soon as possible for a visit on 03/28/2017.   Specialty:  Podiatry Why:  @ 3:30 pm Contact information: [place] [address] [phone]        [name], NP On 03/29/2017.   Specialties:  Internal Medicine, Emergency Medicine Why:  @ 4:15  pm Contact information: [place] [address] [phone]             Discharge Instructions  and  Discharge Medications      Allergies as of 03/21/2017   No Known Allergies     Medication List    STOP taking these  medications   JANUVIA 100 MG tablet Generic drug:  sitaGLIPtin     TAKE these medications   amoxicillin-clavulanate 875-125 MG tablet Commonly known as:  AUGMENTIN Take 1 tablet by mouth 2 (two) times daily for 14 days.   aspirin 81 MG EC tablet Take 1 tablet (81 mg total) by mouth daily.   BAYER CONTOUR NEXT MONITOR w/Device Kit 1 Device   buPROPion 150 MG 24 hr tablet Commonly known as:  WELLBUTRIN XL Take 1 tablet (150 mg total) by mouth daily.   Fish Oil 1200 MG Caps Take 1,200 mg by mouth 3 (three) times daily.   glipiZIDE 5 MG tablet Commonly known as:  GLUCOTROL Take 1 tablet (5 mg total) by mouth 2 (two) times daily before a meal. What changed:    medication strength  See the new instructions.   glucose blood test strip Commonly known as:  BAYER CONTOUR NEXT TEST 1 each by Other route 3 (three) times daily as needed for other. Use as instructed   HYDROcodone-acetaminophen 5-325 MG tablet Commonly known as:  NORCO/VICODIN Take 1-2 tablets by mouth every 4 (four) hours as needed for moderate pain.   insulin aspart 100 UNIT/ML injection Commonly known as:  novoLOG Inject 0-5 Units into the skin at bedtime.   insulin aspart 100 UNIT/ML injection Commonly known as:  novoLOG Inject 0-9 Units into the skin 3 (three) times daily with meals.   insulin detemir 100 UNIT/ML injection Commonly known as:  LEVEMIR Inject 0.23 mLs (23 Units total) into the skin daily. What changed:    how much to take  when to take this   Insulin Pen Needle 31G X 6 MM Misc 1 each by Does not apply route daily.   lisinopril 2.5 MG tablet Commonly known as:  PRINIVIL,ZESTRIL TAKE ONE TABLET BY MOUTH ONCE DAILY   metFORMIN 1000 MG tablet Commonly known as:  GLUCOPHAGE Take 1 tablet (1,000 mg total) by mouth 2 (two) times daily with a meal.   MULTIVITAMIN PO Take 1 capsule by mouth daily.   mupirocin cream 2 % Commonly known as:  BACTROBAN Apply topically daily.    nitroGLYCERIN 0.4 MG SL tablet Commonly known as:  NITROSTAT Place 1 tablet (0.4 mg total) under the tongue every 5 (five) minutes x 3 doses as needed for chest pain.   simvastatin 80 MG tablet Commonly known as:  ZOCOR Take 1 tablet (80 mg total) by mouth daily.         Diet and Activity recommendation: See Discharge Instructions above   Consults obtained -podiatry, case manager, diabetic nurse   Major procedures and Radiology Reports - PLEASE review detailed and final reports for all details, in brief -     Dg Foot Complete Right  Result Date: 03/18/2017 CLINICAL DATA:  Blister along the medial aspect of the great toe with pain and swelling. EXAM: RIGHT FOOT COMPLETE - 3+ VIEW COMPARISON:  None. FINDINGS: Slight soft tissue prominence along the medial aspect of the great toe at the base of the distal phalanx. No underlying osseous involvement or bone destruction is seen. No acute fractures noted. There is no joint dislocation no soft  tissue emphysema, soft tissue mineralization or mass. IMPRESSION: No acute osseous appearing abnormality of the right foot. Slight soft tissue prominence along the medial aspect of the great toe compatible with history of blister. Electronically Signed   By: Ashley Royalty M.D.   On: 03/18/2017 19:13    Micro Results    Recent Results (from the past 240 hour(s))  Blood culture (routine x 2)     Status: None   Collection Time: 03/18/17  9:35 PM  Result Value Ref Range Status   Specimen Description BLOOD RIGHT FOREARM  Final   Special Requests   Final    BOTTLES DRAWN AEROBIC AND ANAEROBIC Blood Culture adequate volume   Culture   Final    NO GROWTH 5 DAYS Performed at [facility], [address], [place], [place] [address]    Report Status 03/23/2017 FINAL  Final  Blood culture (routine x 2)     Status: None   Collection Time: 03/18/17  9:35 PM  Result Value Ref Range Status   Specimen Description BLOOD LEFT FOREARM  Final    Special Requests   Final    BOTTLES DRAWN AEROBIC AND ANAEROBIC Blood Culture adequate volume   Culture   Final    NO GROWTH 5 DAYS Performed at [facility], [address] [place], [place] [address]    Report Status 03/23/2017 FINAL  Final  Aerobic/Anaerobic Culture (surgical/deep wound)     Status: None (Preliminary result)   Collection Time: 03/19/17 12:13 PM  Result Value Ref Range Status   Specimen Description WOUND TOE RIGHT  Final   Special Requests   Final    Immunocompromised Performed at [facility], [address] [place], [place] [address]    Gram Stain   Final    RARE WBC PRESENT, PREDOMINANTLY PMN RARE GRAM POSITIVE COCCI    Culture   Final    MODERATE STAPHYLOCOCCUS AUREUS HOLDING FOR POSSIBLE ANAEROBE Performed at [facility], [place] [address], [place], [place] [address]    Report Status PENDING  Incomplete   Organism ID, Bacteria STAPHYLOCOCCUS AUREUS  Final      Susceptibility   Staphylococcus aureus - MIC*    CIPROFLOXACIN <=0.5 SENSITIVE Sensitive     ERYTHROMYCIN <=0.25 SENSITIVE Sensitive     GENTAMICIN <=0.5 SENSITIVE Sensitive     OXACILLIN <=0.25 SENSITIVE Sensitive     TETRACYCLINE <=1 SENSITIVE Sensitive     VANCOMYCIN <=0.5 SENSITIVE Sensitive     TRIMETH/SULFA <=10 SENSITIVE Sensitive     CLINDAMYCIN <=0.25 SENSITIVE Sensitive     RIFAMPIN <=0.5 SENSITIVE Sensitive     Inducible Clindamycin NEGATIVE Sensitive     * MODERATE STAPHYLOCOCCUS AUREUS       Today   Subjective:   Martin Mullen today has no headache,no chest abdominal pain,no new weakness tingling or numbness, feels much better wants to go home today.   Objective:   Blood pressure 122/76, pulse 95, temperature 97.8 F (36.6 C), temperature source Oral, resp. rate 20, height 5' 10"  (1.778 m), weight 104.7 kg (230 lb 12.8 oz), SpO2 99 %.  No intake or output data in the 24 hours ending 03/25/17 0736  Exam Awake Alert, Oriented x 3, No new F.N  deficits, Normal affect [place].AT,PERRAL Supple Neck,No JVD, No cervical lymphadenopathy appriciated.  Symmetrical Chest wall movement, Good air movement bilaterally, CTAB RRR,No Gallops,Rubs or new Murmurs, No Parasternal Heave +ve B.Sounds, Abd Soft, Non tender, No organomegaly appriciated, No rebound -guarding or  rigidity. Dressing present on the right foot, patient is given right ankle boot to keep the pressure off.  Data Review   CBC w Diff:  Lab Results  Component Value Date   WBC 13.0 (H) 03/19/2017   HGB 15.7 03/19/2017   HGB 16.4 01/29/2013   HCT 46.0 03/19/2017   HCT 48.2 01/29/2013   PLT 215 03/19/2017   PLT 222 01/29/2013   LYMPHOPCT 18 03/18/2017   MONOPCT 7 03/18/2017   EOSPCT 1 03/18/2017   BASOPCT 1 03/18/2017    CMP:  Lab Results  Component Value Date   NA 138 03/19/2017   NA 139 01/29/2013   K 3.4 (L) 03/19/2017   K 4.0 01/29/2013   CL 103 03/19/2017   CL 107 01/29/2013   CO2 24 03/19/2017   CO2 28 01/29/2013   BUN 9 03/19/2017   BUN 15 01/29/2013   CREATININE 0.68 03/19/2017   CREATININE 0.88 01/29/2013   PROT 7.1 03/18/2017   ALBUMIN 3.6 03/18/2017   BILITOT 0.5 03/18/2017   ALKPHOS 101 03/18/2017   AST 18 03/18/2017   ALT 15 (L) 03/18/2017  .   Total Time in preparing paper work, data evaluation and todays exam - 35 minutes  Epifanio Lesches M.D on 03/21/2017 at 7:36 AM    Note: This dictation was prepared with Dragon dictation along with smaller phrase technology. Any transcriptional errors that result from this process are unintentional.

## 2017-03-26 LAB — AEROBIC/ANAEROBIC CULTURE W GRAM STAIN (SURGICAL/DEEP WOUND)

## 2017-03-28 DIAGNOSIS — L97509 Non-pressure chronic ulcer of other part of unspecified foot with unspecified severity: Secondary | ICD-10-CM | POA: Diagnosis not present

## 2017-03-28 DIAGNOSIS — E11621 Type 2 diabetes mellitus with foot ulcer: Secondary | ICD-10-CM | POA: Diagnosis not present

## 2017-03-28 DIAGNOSIS — L97522 Non-pressure chronic ulcer of other part of left foot with fat layer exposed: Secondary | ICD-10-CM | POA: Diagnosis not present

## 2017-03-29 ENCOUNTER — Inpatient Hospital Stay: Payer: Self-pay | Admitting: Internal Medicine

## 2017-03-30 ENCOUNTER — Ambulatory Visit: Payer: BLUE CROSS/BLUE SHIELD | Admitting: Internal Medicine

## 2017-03-30 ENCOUNTER — Encounter: Payer: Self-pay | Admitting: Internal Medicine

## 2017-03-30 VITALS — BP 124/86 | HR 96 | Temp 97.9°F | Wt 233.0 lb

## 2017-03-30 DIAGNOSIS — E11628 Type 2 diabetes mellitus with other skin complications: Secondary | ICD-10-CM

## 2017-03-30 DIAGNOSIS — L02612 Cutaneous abscess of left foot: Secondary | ICD-10-CM | POA: Diagnosis not present

## 2017-04-04 ENCOUNTER — Other Ambulatory Visit: Payer: Self-pay | Admitting: Internal Medicine

## 2017-04-04 ENCOUNTER — Telehealth: Payer: Self-pay

## 2017-04-04 MED ORDER — ALPRAZOLAM 0.25 MG PO TABS: 0.2500 mg | ORAL_TABLET | Freq: Two times a day (BID) | ORAL | 0 refills | Status: DC | PRN

## 2017-04-04 NOTE — Telephone Encounter (Signed)
Give him my condolences. I will send Xanax 0.25 mg BID prn to pharmacy #[phone] refills

## 2017-04-04 NOTE — Telephone Encounter (Signed)
Copied from CRM [id number]. Topic: General - Other >> Apr 04, 2017  1:00 PM Percival Spanish wrote:  Pt Mom was killed in a car accident he is having a hard time dealing with it and is asking for something to take edge off. Would like a call back. Her service is Thursday.

## 2017-04-04 NOTE — Telephone Encounter (Signed)
Pt last seen hospital f/u 03/30/17. [facility] [address].

## 2017-04-04 NOTE — Telephone Encounter (Signed)
Left message on voicemail.

## 2017-04-05 ENCOUNTER — Encounter: Payer: Self-pay | Admitting: Internal Medicine

## 2017-04-05 NOTE — Progress Notes (Signed)
Subjective:    Patient ID: Martin Mullen, male    DOB: [birth year], 37 y.o.   MRN: [id number]  HPI  Pt presents to the clinic today for Hospital Follow Up. He went to the ER 2/2 with c/o great toe pain, swelling and drainage. They I&D it. He was admitted for concerns of sepsis, treated with IV Vanc and Zosyn. Wound culture was positive for staph. He was started on oral Augmentin and advised to follow up with podiatry in 2 weeks. Since discharge, he reports he has been doing well. He reports the dressing was just changed by the home health nurse, but he took a picture of the wound just prior to the dressing change. He has continued the Augmentin and denies pain or drainage. He already has his podiatry appt set up.   Review of Systems      Past Medical History:  Diagnosis Date  . Acute transmural inferior wall MI ([place]) 08/28/2012   .5 x 12 mm Veri-flex stent non-DES.  Marland Kitchen Chicken pox   . Coronary artery disease    Inferior ST elevation myocardial infarction in July of 2014. Cardiac catheterization showed 95% distal RCA stenosis and 90% first diagonal stenosis with normal ejection fraction. He underwent PCI in bare-metal stent placement to the distal RCA.  . Diabetes mellitus without complication ([place])   . Hypercholesterolemia   . Tobacco use     Current Outpatient Medications  Medication Sig Dispense Refill  . aspirin 81 MG EC tablet Take 1 tablet (81 mg total) by mouth daily. 30 tablet 4  . Blood Glucose Monitoring Suppl (BAYER CONTOUR NEXT MONITOR) w/Device KIT 1 Device 1 kit 0  . glipiZIDE (GLUCOTROL) 5 MG tablet Take 1 tablet (5 mg total) by mouth 2 (two) times daily before a meal. 30 tablet 0  . glucose blood (BAYER CONTOUR NEXT TEST) test strip 1 each by Other route 3 (three) times daily as needed for other. Use as instructed 100 each 3  . Insulin Pen Needle 31G X 6 MM MISC 1 each by Does not apply route daily. 50 each 11  . metFORMIN (GLUCOPHAGE) 1000 MG tablet Take 1 tablet (1,000  mg total) by mouth 2 (two) times daily with a meal. 60 tablet 0  . mupirocin cream (BACTROBAN) 2 % Apply topically daily. 15 g 0  . nitroGLYCERIN (NITROSTAT) 0.4 MG SL tablet Place 1 tablet (0.4 mg total) under the tongue every 5 (five) minutes x 3 doses as needed for chest pain. 30 tablet 12  . ALPRAZolam (XANAX) 0.25 MG tablet Take 1 tablet (0.25 mg total) by mouth 2 (two) times daily as needed for anxiety. 20 tablet 0  . buPROPion (WELLBUTRIN XL) 150 MG 24 hr tablet Take 1 tablet (150 mg total) by mouth daily. (Patient not taking: Reported on 03/30/2017) 30 tablet 2  . insulin aspart (NOVOLOG) 100 UNIT/ML injection Inject 0-5 Units into the skin at bedtime. (Patient not taking: Reported on 03/30/2017) 10 mL 11  . insulin aspart (NOVOLOG) 100 UNIT/ML injection Inject 0-9 Units into the skin 3 (three) times daily with meals. (Patient not taking: Reported on 03/30/2017) 10 mL 11  . insulin detemir (LEVEMIR) 100 UNIT/ML injection Inject 0.23 mLs (23 Units total) into the skin daily. (Patient not taking: Reported on 03/30/2017) 10 mL 11  . lisinopril (PRINIVIL,ZESTRIL) 2.5 MG tablet TAKE ONE TABLET BY MOUTH ONCE DAILY (Patient not taking: Reported on 03/30/2017) 90 tablet 0  . Omega-3 Fatty Acids (FISH OIL) 1200 MG CAPS  Take 1,200 mg by mouth 3 (three) times daily.    . simvastatin (ZOCOR) 80 MG tablet Take 1 tablet (80 mg total) by mouth daily. (Patient not taking: Reported on 03/30/2017) 30 tablet 3   No current facility-administered medications for this visit.     No Known Allergies  Family History  Problem Relation Age of Onset  . Arthritis Mother        Rheumatoid  . Diabetes Mother   . Hyperlipidemia Mother   . Diabetes Father   . Cancer Maternal Grandfather        Prostate  . Parkinson's disease Maternal Grandfather     Social History   Socioeconomic History  . Marital status: Married    Spouse name: Not on file  . Number of children: Not on file  . Years of education: Not on file    . Highest education level: Not on file  Social Needs  . Financial resource strain: Not on file  . Food insecurity - worry: Not on file  . Food insecurity - inability: Not on file  . Transportation needs - medical: Not on file  . Transportation needs - non-medical: Not on file  Occupational History  . Not on file  Tobacco Use  . Smoking status: Current Every Day Smoker    Packs/day: 1.00    Years: 20.00    Pack years: 20.00    Types: Cigarettes  . Smokeless tobacco: Never Used  Substance and Sexual Activity  . Alcohol use: Yes    Alcohol/week: 0.0 oz    Comment: occasional  . Drug use: No  . Sexual activity: Yes    Birth control/protection: None  Other Topics Concern  . Not on file  Social History Narrative  . Not on file     Constitutional: Denies fever, malaise, fatigue, headache or abrupt weight changes.  Skin: Pt reports wound to left great toe.    No other specific complaints in a complete review of systems (except as listed in HPI above).  Objective:   Physical Exam   BP 124/86   Pulse 96   Temp 97.9 F (36.6 C) (Oral)   Wt 233 lb (105.7 kg)   SpO2 97%   BMI 33.43 kg/m  Wt Readings from Last 3 Encounters:  03/30/17 233 lb (105.7 kg)  03/20/17 230 lb 12.8 oz (104.7 kg)  07/25/16 256 lb 12 oz (116.5 kg)    General: Appears his stated age, obese in NAD. Skin: Area freshly bandaged, so I did not take that down. I reviewed the picture on his phone.   BMET    Component Value Date/Time   NA 138 03/19/2017 0325   NA 139 01/29/2013 0040   K 3.4 (L) 03/19/2017 0325   K 4.0 01/29/2013 0040   CL 103 03/19/2017 0325   CL 107 01/29/2013 0040   CO2 24 03/19/2017 0325   CO2 28 01/29/2013 0040   GLUCOSE 364 (H) 03/19/2017 0325   GLUCOSE 228 (H) 01/29/2013 0040   BUN 9 03/19/2017 0325   BUN 15 01/29/2013 0040   CREATININE 0.68 03/19/2017 0325   CREATININE 0.88 01/29/2013 0040   CALCIUM 8.6 (L) 03/19/2017 0325   CALCIUM 9.2 01/29/2013 0040   GFRNONAA  >60 03/19/2017 0325   GFRNONAA >60 01/29/2013 0040   GFRAA >60 03/19/2017 0325   GFRAA >60 01/29/2013 0040    Lipid Panel     Component Value Date/Time   CHOL 118 10/13/2015 1527   TRIG 111.0 10/13/2015 1527  HDL 33.10 (L) 10/13/2015 1527   CHOLHDL 4 10/13/2015 1527   VLDL 22.2 10/13/2015 1527   LDLCALC 63 10/13/2015 1527    CBC    Component Value Date/Time   WBC 13.0 (H) 03/19/2017 0325   RBC 4.91 03/19/2017 0325   HGB 15.7 03/19/2017 0325   HGB 16.4 01/29/2013 0040   HCT 46.0 03/19/2017 0325   HCT 48.2 01/29/2013 0040   PLT 215 03/19/2017 0325   PLT 222 01/29/2013 0040   MCV 93.7 03/19/2017 0325   MCV 93 01/29/2013 0040   MCH 31.9 03/19/2017 0325   MCHC 34.1 03/19/2017 0325   RDW 13.1 03/19/2017 0325   RDW 13.8 01/29/2013 0040   LYMPHSABS 3.4 03/18/2017 1823   MONOABS 1.3 (H) 03/18/2017 1823   EOSABS 0.2 03/18/2017 1823   BASOSABS 0.1 03/18/2017 1823    Hgb A1C Lab Results  Component Value Date   HGBA1C 11.3 (H) 03/20/2017           Assessment & Plan:   Hospital Follow Up for Abscess of Left Great Toe:  Hospital notes, labs and imaging reviewed with patient Continue Augmentin Follow up with podiatry as scheduled  Return precautions discussed Webb Silversmith, NP

## 2017-04-05 NOTE — Patient Instructions (Signed)
Skin Abscess A skin abscess is an infected area on or under your skin that contains pus and other material. An abscess can happen almost anywhere on your body. Some abscesses break open (rupture) on their own. Most continue to get worse unless they are treated. The infection can spread deeper into the body and into your blood, which can make you feel sick. Treatment usually involves draining the abscess. Follow these instructions at home: Abscess Care  If you have an abscess that has not drained, place a warm, clean, wet washcloth over the abscess several times a day. Do this as told by your doctor.  Follow instructions from your doctor about how to take care of your abscess. Make sure you: ? Cover the abscess with a bandage (dressing). ? Change your bandage or gauze as told by your doctor. ? Wash your hands with soap and water before you change the bandage or gauze. If you cannot use soap and water, use hand sanitizer.  Check your abscess every day for signs that the infection is getting worse. Check for: ? More redness, swelling, or pain. ? More fluid or blood. ? Warmth. ? More pus or a bad smell. Medicines   Take over-the-counter and prescription medicines only as told by your doctor.  If you were prescribed an antibiotic medicine, take it as told by your doctor. Do not stop taking the antibiotic even if you start to feel better. General instructions  To avoid spreading the infection: ? Do not share personal care items, towels, or hot tubs with others. ? Avoid making skin-to-skin contact with other people.  Keep all follow-up visits as told by your doctor. This is important. Contact a doctor if:  You have more redness, swelling, or pain around your abscess.  You have more fluid or blood coming from your abscess.  Your abscess feels warm when you touch it.  You have more pus or a bad smell coming from your abscess.  You have a fever.  Your muscles ache.  You have  chills.  You feel sick. Get help right away if:  You have very bad (severe) pain.  You see red streaks on your skin spreading away from the abscess. This information is not intended to replace advice given to you by your health care provider. Make sure you discuss any questions you have with your health care provider. Document Released: 07/20/2007 Document Revised: 09/27/2015 Document Reviewed: 12/10/2014 Elsevier Interactive Patient Education  2018 [employer]vier Inc.  

## 2017-04-06 ENCOUNTER — Ambulatory Visit: Payer: Self-pay | Admitting: Internal Medicine

## 2017-04-06 DIAGNOSIS — Z0289 Encounter for other administrative examinations: Secondary | ICD-10-CM

## 2017-04-11 DIAGNOSIS — L97522 Non-pressure chronic ulcer of other part of left foot with fat layer exposed: Secondary | ICD-10-CM | POA: Diagnosis not present

## 2017-04-11 DIAGNOSIS — L97512 Non-pressure chronic ulcer of other part of right foot with fat layer exposed: Secondary | ICD-10-CM | POA: Diagnosis not present

## 2017-04-11 DIAGNOSIS — E11621 Type 2 diabetes mellitus with foot ulcer: Secondary | ICD-10-CM | POA: Diagnosis not present

## 2017-04-11 DIAGNOSIS — L97509 Non-pressure chronic ulcer of other part of unspecified foot with unspecified severity: Secondary | ICD-10-CM | POA: Diagnosis not present

## 2017-04-24 ENCOUNTER — Other Ambulatory Visit: Payer: Self-pay | Admitting: Internal Medicine

## 2017-04-24 DIAGNOSIS — I209 Angina pectoris, unspecified: Secondary | ICD-10-CM

## 2017-06-20 ENCOUNTER — Ambulatory Visit: Payer: BLUE CROSS/BLUE SHIELD | Admitting: Internal Medicine

## 2017-06-20 ENCOUNTER — Encounter: Payer: Self-pay | Admitting: Internal Medicine

## 2017-06-20 VITALS — BP 134/82 | HR 104 | Temp 97.9°F | Ht 70.0 in | Wt 243.0 lb

## 2017-06-20 DIAGNOSIS — E1165 Type 2 diabetes mellitus with hyperglycemia: Secondary | ICD-10-CM | POA: Diagnosis not present

## 2017-06-20 DIAGNOSIS — I10 Essential (primary) hypertension: Secondary | ICD-10-CM

## 2017-06-20 DIAGNOSIS — I213 ST elevation (STEMI) myocardial infarction of unspecified site: Secondary | ICD-10-CM | POA: Diagnosis not present

## 2017-06-20 DIAGNOSIS — IMO0002 Reserved for concepts with insufficient information to code with codable children: Secondary | ICD-10-CM

## 2017-06-20 DIAGNOSIS — Z23 Encounter for immunization: Secondary | ICD-10-CM

## 2017-06-20 DIAGNOSIS — E7800 Pure hypercholesterolemia, unspecified: Secondary | ICD-10-CM

## 2017-06-20 DIAGNOSIS — I2510 Atherosclerotic heart disease of native coronary artery without angina pectoris: Secondary | ICD-10-CM | POA: Diagnosis not present

## 2017-06-20 DIAGNOSIS — F4323 Adjustment disorder with mixed anxiety and depressed mood: Secondary | ICD-10-CM | POA: Diagnosis not present

## 2017-06-20 DIAGNOSIS — Z794 Long term (current) use of insulin: Secondary | ICD-10-CM | POA: Diagnosis not present

## 2017-06-20 DIAGNOSIS — E1140 Type 2 diabetes mellitus with diabetic neuropathy, unspecified: Secondary | ICD-10-CM | POA: Diagnosis not present

## 2017-06-20 LAB — CBC
HCT: 51.1 % (ref 39.0–52.0)
HEMOGLOBIN: 17.9 g/dL — AB (ref 13.0–17.0)
MCHC: 35.1 g/dL (ref 30.0–36.0)
MCV: 94.4 fl (ref 78.0–100.0)
PLATELETS: 214 10*3/uL (ref 150.0–400.0)
RBC: 5.41 Mil/uL (ref 4.22–5.81)
RDW: 13.7 % (ref 11.5–15.5)
WBC: 10.9 10*3/uL — ABNORMAL HIGH (ref 4.0–10.5)

## 2017-06-20 LAB — HM DIABETES EYE EXAM

## 2017-06-20 LAB — COMPREHENSIVE METABOLIC PANEL
ALBUMIN: 3.9 g/dL (ref 3.5–5.2)
ALT: 20 U/L (ref 0–53)
AST: 12 U/L (ref 0–37)
Alkaline Phosphatase: 106 U/L (ref 39–117)
BUN: 10 mg/dL (ref 6–23)
CALCIUM: 9.4 mg/dL (ref 8.4–10.5)
CO2: 28 meq/L (ref 19–32)
CREATININE: 0.88 mg/dL (ref 0.40–1.50)
Chloride: 98 mEq/L (ref 96–112)
GFR: 103.51 mL/min (ref >60.00)
Glucose, Bld: 485 mg/dL — ABNORMAL HIGH (ref 70–99)
POTASSIUM: 4 meq/L (ref 3.5–5.1)
Sodium: 134 mEq/L — ABNORMAL LOW (ref 135–145)
Total Bilirubin: 0.4 mg/dL (ref 0.2–1.2)
Total Protein: 7 g/dL (ref 6.0–8.3)

## 2017-06-20 LAB — LIPID PANEL
CHOL/HDL RATIO: 6
CHOLESTEROL: 181 mg/dL (ref 0–200)
HDL: 31.8 mg/dL — AB (ref >39.00)
NonHDL: 149.12
TRIGLYCERIDES: 251 mg/dL — AB (ref 0.0–149.0)
VLDL: 50.2 mg/dL — AB (ref 0.0–40.0)

## 2017-06-20 LAB — LDL CHOLESTEROL, DIRECT: LDL DIRECT: 126 mg/dL

## 2017-06-20 LAB — HEMOGLOBIN A1C: Hgb A1c MFr Bld: 10.3 % — ABNORMAL HIGH (ref 4.6–6.5)

## 2017-06-20 MED ORDER — SIMVASTATIN 80 MG PO TABS
80.0000 mg | ORAL_TABLET | Freq: Every day | ORAL | 3 refills | Status: DC
Start: 2017-06-20 — End: 2018-09-27

## 2017-06-20 MED ORDER — BUPROPION HCL ER (XL) 150 MG PO TB24: 150.0000 mg | ORAL_TABLET | Freq: Every day | ORAL | 3 refills | Status: DC

## 2017-06-20 MED ORDER — GLIPIZIDE 5 MG PO TABS: 5.0000 mg | ORAL_TABLET | Freq: Two times a day (BID) | ORAL | 0 refills | Status: DC

## 2017-06-20 MED ORDER — LISINOPRIL 10 MG PO TABS: 10.0000 mg | ORAL_TABLET | Freq: Every day | ORAL | 3 refills | Status: DC

## 2017-06-20 MED ORDER — INSULIN DETEMIR 100 UNIT/ML ~~LOC~~ SOLN: 23.0000 [IU] | Freq: Every day | SUBCUTANEOUS | 2 refills | Status: DC

## 2017-06-20 MED ORDER — METFORMIN HCL 1000 MG PO TABS: 1000.0000 mg | ORAL_TABLET | Freq: Two times a day (BID) | ORAL | 0 refills | Status: DC

## 2017-06-20 NOTE — Assessment & Plan Note (Signed)
Deteriorated Support offered today Will restart Wellbutrin

## 2017-06-20 NOTE — Assessment & Plan Note (Addendum)
No angina Will increase Lisinopril Discussed the importance of him restarting his Simvastatin, refilled today Continue ASA Encouraged him to increase aerobic exercise

## 2017-06-20 NOTE — Assessment & Plan Note (Signed)
CMET and lipid profile today Encouraged him to consume a low saturated fat diet Advised him to restart Simvastatin, refilled today

## 2017-06-20 NOTE — Assessment & Plan Note (Signed)
Borderline Will increase Lisinopril to 10 mg daily Reinforced DASH diet and exercise for weight loss CBC and CMET today

## 2017-06-20 NOTE — Assessment & Plan Note (Addendum)
Uncontrolled, noncompliant A1C today No microalbumin secondary to ACEI therapy Encouraged him to consume a low carb diet and exercise for weight loss Continue Levemir, Novolog, Metformin and Glipizide, will adjust if needed based on labs Eye exam today Foot exam today Encouraged him to get a flu shot in the fall Pneumovax today

## 2017-06-20 NOTE — Patient Instructions (Signed)

## 2017-06-20 NOTE — Progress Notes (Signed)
Subjective:    Patient ID: Martin Mullen, male    DOB: Sep 03, 1980, 37 y.o.   MRN: [id number]  HPI  Patient presents to the clinic today for follow-up of chronic conditions.  HLD with CAD status post STEMI: His last LDL was 63, 09/2015.  He is taking not Simvastatin but he is taking his ASA.  He denies myalgias. He follows with Dr. Fletcher Anon.  Anxiety and Depression: Situational secondary to recent separation, loss of mother. He is not taking Wellbutrin or Xanax.  He would like to get started on the Wellbutrin today. There is no CSA or UDS on file  HTN: His BP today is 134/82.  He is taking Lisinopril for renal protection secondary to diabetes, but is not currently taking any antihypertensive therapy.  His EKG from 07/2016 reviewed.  DM2: His last A1c was 11.3%, 03/2017.  His sugars range 212-480.  He is taking Levemir and Novolog as prescribed. He takes Glipizide and Metformin 1 x day. He no showed his appt with Dr. Marchelle Folks at [facility] His last eye exam was 06/2017.  Flu 01/2016.  Pneumovax 2014.  Review of Systems  Past Medical History:  Diagnosis Date  . Acute transmural inferior wall MI ([place]) 08/28/2012   .5 x 12 mm Veri-flex stent non-DES.  Marland Kitchen Chicken pox   . Coronary artery disease    Inferior ST elevation myocardial infarction in July of 2014. Cardiac catheterization showed 95% distal RCA stenosis and 90% first diagonal stenosis with normal ejection fraction. He underwent PCI in bare-metal stent placement to the distal RCA.  . Diabetes mellitus without complication ([place])   . Hypercholesterolemia   . Tobacco use     Current Outpatient Medications  Medication Sig Dispense Refill  . ALPRAZolam (XANAX) 0.25 MG tablet Take 1 tablet (0.25 mg total) by mouth 2 (two) times daily as needed for anxiety. 20 tablet 0  . aspirin 81 MG EC tablet Take 1 tablet (81 mg total) by mouth daily. 30 tablet 4  . Blood Glucose Monitoring Suppl (BAYER CONTOUR NEXT MONITOR) w/Device KIT 1 Device 1  kit 0  . buPROPion (WELLBUTRIN XL) 150 MG 24 hr tablet Take 1 tablet (150 mg total) by mouth daily. (Patient not taking: Reported on 03/30/2017) 30 tablet 2  . glipiZIDE (GLUCOTROL) 5 MG tablet Take 1 tablet (5 mg total) by mouth 2 (two) times daily before a meal. 30 tablet 0  . glucose blood (BAYER CONTOUR NEXT TEST) test strip 1 each by Other route 3 (three) times daily as needed for other. Use as instructed 100 each 3  . insulin aspart (NOVOLOG) 100 UNIT/ML injection Inject 0-5 Units into the skin at bedtime. (Patient not taking: Reported on 03/30/2017) 10 mL 11  . insulin aspart (NOVOLOG) 100 UNIT/ML injection Inject 0-9 Units into the skin 3 (three) times daily with meals. (Patient not taking: Reported on 03/30/2017) 10 mL 11  . insulin detemir (LEVEMIR) 100 UNIT/ML injection Inject 0.23 mLs (23 Units total) into the skin daily. (Patient not taking: Reported on 03/30/2017) 10 mL 11  . Insulin Pen Needle 31G X 6 MM MISC 1 each by Does not apply route daily. 50 each 11  . lisinopril (PRINIVIL,ZESTRIL) 2.5 MG tablet TAKE ONE TABLET BY MOUTH ONCE DAILY (Patient not taking: Reported on 03/30/2017) 90 tablet 0  . metFORMIN (GLUCOPHAGE) 1000 MG tablet Take 1 tablet (1,000 mg total) by mouth 2 (two) times daily with a meal. 60 tablet 0  . mupirocin cream (BACTROBAN) 2 %  Apply topically daily. 15 g 0  . nitroGLYCERIN (NITROSTAT) 0.4 MG SL tablet DISSOLVE ONE TABLET UNDER THE TONGUE EVERY 5 MINUTES AS NEEDED FOR CHEST PAIN.  DO NOT EXCEED A TOTAL OF 3 DOSES IN 15 MINUTES 25 tablet 12  . Omega-3 Fatty Acids (FISH OIL) 1200 MG CAPS Take 1,200 mg by mouth 3 (three) times daily.    . simvastatin (ZOCOR) 80 MG tablet Take 1 tablet (80 mg total) by mouth daily. (Patient not taking: Reported on 03/30/2017) 30 tablet 3   No current facility-administered medications for this visit.     No Known Allergies  Family History  Problem Relation Age of Onset  . Arthritis Mother        Rheumatoid  . Diabetes Mother   .  Hyperlipidemia Mother   . Diabetes Father   . Cancer Maternal Grandfather        Prostate  . Parkinson's disease Maternal Grandfather     Social History   Socioeconomic History  . Marital status: Married    Spouse name: Not on file  . Number of children: Not on file  . Years of education: Not on file  . Highest education level: Not on file  Occupational History  . Not on file  Social Needs  . Financial resource strain: Not on file  . Food insecurity:    Worry: Not on file    Inability: Not on file  . Transportation needs:    Medical: Not on file    Non-medical: Not on file  Tobacco Use  . Smoking status: Current Every Day Smoker    Packs/day: 1.00    Years: 20.00    Pack years: 20.00    Types: Cigarettes  . Smokeless tobacco: Never Used  Substance and Sexual Activity  . Alcohol use: Yes    Alcohol/week: 0.0 oz    Comment: occasional  . Drug use: No    Types: Cocaine  . Sexual activity: Yes    Birth control/protection: None  Lifestyle  . Physical activity:    Days per week: Not on file    Minutes per session: Not on file  . Stress: Not on file  Relationships  . Social connections:    Talks on phone: Not on file    Gets together: Not on file    Attends religious service: Not on file    Active member of club or organization: Not on file    Attends meetings of clubs or organizations: Not on file    Relationship status: Not on file  . Intimate partner violence:    Fear of current or ex partner: Not on file    Emotionally abused: Not on file    Physically abused: Not on file    Forced sexual activity: Not on file  Other Topics Concern  . Not on file  Social History Narrative  . Not on file     Constitutional: Denies fever, malaise, fatigue, headache or abrupt weight changes.  HEENT: Denies eye pain, eye redness, ear pain, ringing in the ears, wax buildup, runny nose, nasal congestion, bloody nose, or sore throat. Respiratory: Denies difficulty breathing,  shortness of breath, cough or sputum production.   Cardiovascular: Denies chest pain, chest tightness, palpitations or swelling in the hands or feet.  Gastrointestinal: Pt reports increased thirst. Denies abdominal pain, bloating, constipation, diarrhea or blood in the stool.  GU: Pt reports urinary frequency. Denies urgency, pain with urination, burning sensation, blood in urine, odor or discharge. Musculoskeletal:  Denies decrease in range of motion, difficulty with gait, muscle pain or joint pain and swelling.  Skin: Denies redness, rashes, lesions or ulcercations.  Neurological: Pt reports numbness and tingling in feet.Denies dizziness, difficulty with memory, difficulty with speech or problems with balance and coordination.  Psych: Pt reports anxiety and depression. Denies SI/HI.  No other specific complaints in a complete review of systems (except as listed in HPI above).     Objective:   Physical Exam   BP 134/82   Pulse (!) 104   Temp 97.9 F (36.6 C) (Oral)   Wt 243 lb (110.2 kg)   SpO2 97%   BMI 34.87 kg/m  Wt Readings from Last 3 Encounters:  06/20/17 243 lb (110.2 kg)  03/30/17 233 lb (105.7 kg)  03/20/17 230 lb 12.8 oz (104.7 kg)    General: Appears his stated age, obese in NAD. Skin: Warm, dry and intact. No ulcerations noted.  Cardiovascular: Normal rate and rhythm. S1,S2 noted.  No murmur, rubs or gallops noted. No JVD or BLE edema. No carotid bruits noted. Pulmonary/Chest: Normal effort and positive vesicular breath sounds. No respiratory distress. No wheezes, rales or ronchi noted.  Neurological: Alert and oriented. Sensation intact to BLE.   BMET    Component Value Date/Time   NA 138 03/19/2017 0325   NA 139 01/29/2013 0040   K 3.4 (L) 03/19/2017 0325   K 4.0 01/29/2013 0040   CL 103 03/19/2017 0325   CL 107 01/29/2013 0040   CO2 24 03/19/2017 0325   CO2 28 01/29/2013 0040   GLUCOSE 364 (H) 03/19/2017 0325   GLUCOSE 228 (H) 01/29/2013 0040   BUN 9  03/19/2017 0325   BUN 15 01/29/2013 0040   CREATININE 0.68 03/19/2017 0325   CREATININE 0.88 01/29/2013 0040   CALCIUM 8.6 (L) 03/19/2017 0325   CALCIUM 9.2 01/29/2013 0040   GFRNONAA >60 03/19/2017 0325   GFRNONAA >60 01/29/2013 0040   GFRAA >60 03/19/2017 0325   GFRAA >60 01/29/2013 0040    Lipid Panel     Component Value Date/Time   CHOL 118 10/13/2015 1527   TRIG 111.0 10/13/2015 1527   HDL 33.10 (L) 10/13/2015 1527   CHOLHDL 4 10/13/2015 1527   VLDL 22.2 10/13/2015 1527   LDLCALC 63 10/13/2015 1527    CBC    Component Value Date/Time   WBC 13.0 (H) 03/19/2017 0325   RBC 4.91 03/19/2017 0325   HGB 15.7 03/19/2017 0325   HGB 16.4 01/29/2013 0040   HCT 46.0 03/19/2017 0325   HCT 48.2 01/29/2013 0040   PLT 215 03/19/2017 0325   PLT 222 01/29/2013 0040   MCV 93.7 03/19/2017 0325   MCV 93 01/29/2013 0040   MCH 31.9 03/19/2017 0325   MCHC 34.1 03/19/2017 0325   RDW 13.1 03/19/2017 0325   RDW 13.8 01/29/2013 0040   LYMPHSABS 3.4 03/18/2017 1823   MONOABS 1.3 (H) 03/18/2017 1823   EOSABS 0.2 03/18/2017 1823   BASOSABS 0.1 03/18/2017 1823    Hgb A1C Lab Results  Component Value Date   HGBA1C 11.3 (H) 03/20/2017           Assessment & Plan:

## 2017-06-20 NOTE — Assessment & Plan Note (Signed)
No angina Will increase Lisinopril Discussed the importance of him restarting his Simvastatin, refilled today Continue ASA Encouraged him to increase aerobic exercise 

## 2017-06-21 NOTE — Addendum Note (Signed)
Addended by: Roena Malady on: 06/21/2017 09:35 AM   Modules accepted: Orders

## 2017-06-29 MED ORDER — INSULIN ASPART 100 UNIT/ML ~~LOC~~ SOLN: 5.0000 [IU] | Freq: Two times a day (BID) | SUBCUTANEOUS | 1 refills | Status: DC

## 2017-06-29 NOTE — Addendum Note (Signed)
Addended by: Roena Malady on: 06/29/2017 05:26 PM   Modules accepted: Orders

## 2017-09-03 ENCOUNTER — Other Ambulatory Visit: Payer: Self-pay | Admitting: Internal Medicine

## 2017-09-20 ENCOUNTER — Encounter: Payer: BLUE CROSS/BLUE SHIELD | Admitting: Internal Medicine

## 2017-09-20 ENCOUNTER — Ambulatory Visit: Payer: Self-pay | Admitting: Internal Medicine

## 2017-09-20 DIAGNOSIS — Z0289 Encounter for other administrative examinations: Secondary | ICD-10-CM

## 2017-09-20 NOTE — Progress Notes (Deleted)
Subjective:    Patient ID: Martin Mullen, male    DOB: July 21, 1980, 37 y.o.   MRN: [id number]  HPI  Pt presents to the clinic today for his annual exam.  Flu: 01/2016 Tetanus: 06/2012 Pneumovax: 06/2017 Vision Screening: Dentist:  Diet: Exercise:  Review of Systems      Past Medical History:  Diagnosis Date  . Acute transmural inferior wall MI ([place]) 08/28/2012   .5 x 12 mm Veri-flex stent non-DES.  Marland Kitchen Chicken pox   . Coronary artery disease    Inferior ST elevation myocardial infarction in July of 2014. Cardiac catheterization showed 95% distal RCA stenosis and 90% first diagonal stenosis with normal ejection fraction. He underwent PCI in bare-metal stent placement to the distal RCA.  . Diabetes mellitus without complication ([place])   . Hypercholesterolemia   . Tobacco use     Current Outpatient Medications  Medication Sig Dispense Refill  . aspirin 81 MG EC tablet Take 1 tablet (81 mg total) by mouth daily. 30 tablet 4  . Blood Glucose Monitoring Suppl (BAYER CONTOUR NEXT MONITOR) w/Device KIT 1 Device 1 kit 0  . buPROPion (WELLBUTRIN XL) 150 MG 24 hr tablet Take 1 tablet (150 mg total) by mouth daily. 90 tablet 3  . glipiZIDE (GLUCOTROL) 5 MG tablet Take 1 tablet (5 mg total) by mouth 2 (two) times daily before a meal. 180 tablet 0  . glucose blood (BAYER CONTOUR NEXT TEST) test strip 1 each by Other route 3 (three) times daily as needed for other. Use as instructed 100 each 3  . insulin aspart (NOVOLOG) 100 UNIT/ML injection Inject 5 Units into the skin 2 (two) times daily. 10 mL 1  . insulin detemir (LEVEMIR) 100 UNIT/ML injection Inject 0.23 mLs (23 Units total) into the skin daily. 10 mL 2  . Insulin Pen Needle 31G X 6 MM MISC 1 each by Does not apply route daily. 50 each 11  . lisinopril (PRINIVIL,ZESTRIL) 10 MG tablet Take 1 tablet (10 mg total) by mouth daily. 90 tablet 3  . metFORMIN (GLUCOPHAGE) 1000 MG tablet Take 1 tablet (1,000 mg total) by mouth 2 (two) times  daily with a meal. 180 tablet 0  . nitroGLYCERIN (NITROSTAT) 0.4 MG SL tablet DISSOLVE ONE TABLET UNDER THE TONGUE EVERY 5 MINUTES AS NEEDED FOR CHEST PAIN.  DO NOT EXCEED A TOTAL OF 3 DOSES IN 15 MINUTES 25 tablet 12  . Omega-3 Fatty Acids (FISH OIL) 1200 MG CAPS Take 1,200 mg by mouth 3 (three) times daily.    . simvastatin (ZOCOR) 80 MG tablet Take 1 tablet (80 mg total) by mouth daily. 90 tablet 3   No current facility-administered medications for this visit.     No Known Allergies  Family History  Problem Relation Age of Onset  . Arthritis Mother        Rheumatoid  . Diabetes Mother   . Hyperlipidemia Mother   . Diabetes Father   . Cancer Maternal Grandfather        Prostate  . Parkinson's disease Maternal Grandfather     Social History   Socioeconomic History  . Marital status: Married    Spouse name: Not on file  . Number of children: Not on file  . Years of education: Not on file  . Highest education level: Not on file  Occupational History  . Not on file  Social Needs  . Financial resource strain: Not on file  . Food insecurity:    Worry: Not  on file    Inability: Not on file  . Transportation needs:    Medical: Not on file    Non-medical: Not on file  Tobacco Use  . Smoking status: Current Every Day Smoker    Packs/day: 1.50    Years: 20.00    Pack years: 30.00    Types: Cigarettes  . Smokeless tobacco: Never Used  Substance and Sexual Activity  . Alcohol use: Yes    Alcohol/week: 0.0 oz    Comment: occasional  . Drug use: No    Types: Cocaine  . Sexual activity: Yes    Birth control/protection: None  Lifestyle  . Physical activity:    Days per week: Not on file    Minutes per session: Not on file  . Stress: Not on file  Relationships  . Social connections:    Talks on phone: Not on file    Gets together: Not on file    Attends religious service: Not on file    Active member of club or organization: Not on file    Attends meetings of clubs  or organizations: Not on file    Relationship status: Not on file  . Intimate partner violence:    Fear of current or ex partner: Not on file    Emotionally abused: Not on file    Physically abused: Not on file    Forced sexual activity: Not on file  Other Topics Concern  . Not on file  Social History Narrative  . Not on file     Constitutional: Denies fever, malaise, fatigue, headache or abrupt weight changes.  HEENT: Denies eye pain, eye redness, ear pain, ringing in the ears, wax buildup, runny nose, nasal congestion, bloody nose, or sore throat. Respiratory: Denies difficulty breathing, shortness of breath, cough or sputum production.   Cardiovascular: Denies chest pain, chest tightness, palpitations or swelling in the hands or feet.  Gastrointestinal: Denies abdominal pain, bloating, constipation, diarrhea or blood in the stool.  GU: Denies urgency, frequency, pain with urination, burning sensation, blood in urine, odor or discharge. Musculoskeletal: Denies decrease in range of motion, difficulty with gait, muscle pain or joint pain and swelling.  Skin: Denies redness, rashes, lesions or ulcercations.  Neurological: Denies dizziness, difficulty with memory, difficulty with speech or problems with balance and coordination.  Psych: Denies anxiety, depression, SI/HI.  No other specific complaints in a complete review of systems (except as listed in HPI above).  Objective:   Physical Exam        Assessment & Plan:   Preventative Health Maintenance:  RTC in 6 months to follow up chronic conditions Webb Silversmith, NP

## 2017-12-19 ENCOUNTER — Ambulatory Visit: Payer: BLUE CROSS/BLUE SHIELD | Admitting: Licensed Clinical Social Worker

## 2017-12-29 ENCOUNTER — Telehealth: Payer: Self-pay | Admitting: Internal Medicine

## 2017-12-29 NOTE — Telephone Encounter (Signed)
Pt calling and want to know if he is eligble to get a pneumonia shot.

## 2017-12-30 NOTE — Telephone Encounter (Signed)
No, he had one 06/2017

## 2017-12-30 NOTE — Telephone Encounter (Signed)
Left message on voicemail.

## 2018-01-04 ENCOUNTER — Ambulatory Visit: Payer: BLUE CROSS/BLUE SHIELD

## 2018-01-19 ENCOUNTER — Other Ambulatory Visit: Payer: Self-pay | Admitting: Internal Medicine

## 2018-08-25 ENCOUNTER — Other Ambulatory Visit: Payer: Self-pay | Admitting: Internal Medicine

## 2018-08-25 DIAGNOSIS — I209 Angina pectoris, unspecified: Secondary | ICD-10-CM

## 2018-08-31 ENCOUNTER — Encounter: Payer: Self-pay | Admitting: Family Medicine

## 2018-08-31 ENCOUNTER — Other Ambulatory Visit: Payer: Self-pay | Admitting: Internal Medicine

## 2018-08-31 ENCOUNTER — Ambulatory Visit (INDEPENDENT_AMBULATORY_CARE_PROVIDER_SITE_OTHER): Payer: BLUE CROSS/BLUE SHIELD | Admitting: Family Medicine

## 2018-08-31 DIAGNOSIS — L089 Local infection of the skin and subcutaneous tissue, unspecified: Secondary | ICD-10-CM | POA: Insufficient documentation

## 2018-08-31 MED ORDER — SULFAMETHOXAZOLE-TRIMETHOPRIM 800-160 MG PO TABS: 1.0000 | ORAL_TABLET | Freq: Two times a day (BID) | ORAL | 0 refills | Status: DC

## 2018-08-31 NOTE — Progress Notes (Signed)
Virtual Visit via Video Note  I connected with Martin Mullen on 08/31/18 at  4:15 PM EDT by a video enabled telemedicine application and verified that I am speaking with the correct person using two identifiers.  Location: Patient: home Provider: office    I discussed the limitations of evaluation and management by telemedicine and the availability of in person appointments. The patient expressed understanding and agreed to proceed.  History of Present Illness: Pt presents with a possible toe infection  This is a recurrent problem     current smoker Has a smoker's cough  Is also diabetic Lab Results  Component Value Date   HGBA1C 10.3 (H) 06/20/2017    Right great toe  Red and swollen  No injury  Nail is not ingrown Sees podiatry for chronic blisters and foot infections Crack in the skin- under toes   Does not think he has abscess  Some drainage from bottom of the toe- wet /not really draining / clear   Some mild neuropathy  Uncontrolled DM (noncompliant)  Washes with water and alcohol  Uses triple abx ointment  Has not felt malaise-no fever   H/o blisters on feet in past  Hospitalized several days feb of 2019   He has been tx MRSA   Diabetes is not in good control  He is out of test strips and meds  Sugar has been high   No abx allergies  Last took bactrim-no problems   Patient Active Problem List   Diagnosis Date Noted  . Toe infection 08/31/2018  . Coronary artery disease   . DM type 2, uncontrolled, with neuropathy ([place]) 07/09/2013  . STEMI s/p RCA stent 08/2012 09/05/2012  . HTN (hypertension) 09/05/2012  . HLD (hyperlipidemia) 09/05/2012  . Adjustment disorder with mixed anxiety and depressed mood 09/05/2012   Past Medical History:  Diagnosis Date  . Acute transmural inferior wall MI ([place]) 08/28/2012   .5 x 12 mm Veri-flex stent non-DES.  Marland Kitchen Chicken pox   . Coronary artery disease    Inferior ST elevation myocardial infarction in July of 2014.  Cardiac catheterization showed 95% distal RCA stenosis and 90% first diagonal stenosis with normal ejection fraction. He underwent PCI in bare-metal stent placement to the distal RCA.  . Diabetes mellitus without complication ([place])   . Hypercholesterolemia   . Tobacco use    Past Surgical History:  Procedure Laterality Date  . CORONARY ANGIOPLASTY WITH STENT PLACEMENT    . LEFT HEART CATHETERIZATION WITH CORONARY ANGIOGRAM N/A 08/28/2012   Procedure: LEFT HEART CATHETERIZATION WITH CORONARY ANGIOGRAM;  Surgeon: Laverda Page, MD;  Location[facility] C[facility] LAB;  Service: Cardiovascular;  Laterality: N/A;   Social History   Tobacco Use  . Smoking status: Current Every Day Smoker    Packs/day: 1.50    Years: 20.00    Pack years: 30.00    Types: Cigarettes  . Smokeless tobacco: Never Used  Substance Use Topics  . Alcohol use: Yes    Alcohol/week: 0.0 standard drinks    Comment: occasional  . Drug use: No    Types: Cocaine   Family History  Problem Relation Age of Onset  . Arthritis Mother        Rheumatoid  . Diabetes Mother   . Hyperlipidemia Mother   . Diabetes Father   . Cancer Maternal Grandfather        Prostate  . Parkinson's disease Maternal Grandfather    No Known Allergies Current Outpatient Medications on File Prior to  Visit  Medication Sig Dispense Refill  . aspirin 81 MG EC tablet Take 1 tablet (81 mg total) by mouth daily. 30 tablet 4  . Blood Glucose Monitoring Suppl (BAYER CONTOUR NEXT MONITOR) w/Device KIT 1 Device 1 kit 0  . buPROPion (WELLBUTRIN XL) 150 MG 24 hr tablet Take 1 tablet (150 mg total) by mouth daily. 90 tablet 3  . glipiZIDE (GLUCOTROL) 5 MG tablet Take 1 tablet (5 mg total) by mouth 2 (two) times daily before a meal. 180 tablet 0  . glucose blood (BAYER CONTOUR NEXT TEST) test strip 1 each by Other route 3 (three) times daily as needed for other. Use as instructed 100 each 3  . insulin aspart (NOVOLOG) 100 UNIT/ML injection Inject 5 Units into the  skin 2 (two) times daily. 10 mL 1  . Insulin Pen Needle 31G X 6 MM MISC 1 each by Does not apply route daily. 50 each 11  . LEVEMIR 100 UNIT/ML injection INJECT 23 UNITS SUBCUTANEOUSLY ONCE DAILY 10 mL 0  . lisinopril (PRINIVIL,ZESTRIL) 10 MG tablet Take 1 tablet (10 mg total) by mouth daily. 90 tablet 3  . metFORMIN (GLUCOPHAGE) 1000 MG tablet TAKE 1 TABLET BY MOUTH TWICE DAILY WITH MEALS 60 tablet 0  . nitroGLYCERIN (NITROSTAT) 0.4 MG SL tablet DISSOLVE ONE TABLET UNDER THE TONGUE EVERY 5 MINUTES AS NEEDED FOR CHEST PAIN.  DO NOT EXCEED A TOTAL OF 3 DOSES IN 15 MINUTES 25 tablet 12  . Omega-3 Fatty Acids (FISH OIL) 1200 MG CAPS Take 1,200 mg by mouth 3 (three) times daily.    . simvastatin (ZOCOR) 80 MG tablet Take 1 tablet (80 mg total) by mouth daily. 90 tablet 3   No current facility-administered medications on file prior to visit.    Review of Systems  Constitutional: Negative for chills, fever, malaise/fatigue and weight loss.  HENT: Negative for sore throat.   Eyes: Negative for blurred vision, discharge and redness.  Respiratory: Positive for cough. Negative for hemoptysis, sputum production and shortness of breath.        Chronic smokers cough-unchanged  Cardiovascular: Negative for chest pain and palpitations.  Gastrointestinal: Negative for nausea and vomiting.  Musculoskeletal:       Swelling/redness of R great toe  Neurological: Positive for sensory change. Negative for dizziness and headaches.       Neuropathy     Observations/Objective: Patient appears well, in no distress Weight is baseline (obese) No facial swelling or asymmetry Normal voice-not hoarse and no slurred speech No obvious tremor or mobility impairment Moving neck and UEs normally Able to hear the call well  No cough or shortness of breath during interview  Talkative and mentally sharp with no cognitive changes R great toe has areas of erythema with sharp demarcation inferiorly at tip and center of digit  , no obvious drainage or ingrown nail seen  Pt  Is able to bear weight on that foot  Affect is normal    Assessment and Plan: Problem List Items Addressed This Visit      Other   Toe infection    H/o recurrent blisters and infection-sees podiatry  Poorly controlled DM with noncompliance  Today- R great toe has redness/swelling In light of past h/o mrsa- cover with bactrim DS inst to update Monday If worse or any fever or systemic symptoms-recommend ED over the weekend       Relevant Medications   sulfamethoxazole-trimethoprim (BACTRIM DS) 800-160 MG tablet       Follow  Up Instructions: Take the bactrim as directed Follow up with your podiatrist Follow up with Webb Silversmith for your diabetes as well when you can   Elevate foot  Keep very clean with soap and water   Please call if you develop a fever or feel sick Also if redness on toe worsens/spreads or streaks up foot or leg  Also if worse swelling or drainage  Also if this does not continue to improve over the weekend    I discussed the assessment and treatment plan with the patient. The patient was provided an opportunity to ask questions and all were answered. The patient agreed with the plan and demonstrated an understanding of the instructions.   The patient was advised to call back or seek an in-person evaluation if the symptoms worsen or if the condition fails to improve as anticipated.     Loura Pardon, MD

## 2018-08-31 NOTE — Patient Instructions (Signed)
Take the bactrim as directed Follow up with your podiatrist Follow up with Webb Silversmith for your diabetes as well when you can   Elevate foot  Keep very clean with soap and water   Please call if you develop a fever or feel sick Also if redness on toe worsens/spreads or streaks up foot or leg  Also if worse swelling or drainage  Also if this does not continue to improve over the weekend

## 2018-09-02 NOTE — Assessment & Plan Note (Signed)
H/o recurrent blisters and infection-sees podiatry  Poorly controlled DM with noncompliance  Today- R great toe has redness/swelling In light of past h/o mrsa- cover with bactrim DS inst to update Monday If worse or any fever or systemic symptoms-recommend ED over the weekend

## 2018-09-05 ENCOUNTER — Other Ambulatory Visit: Payer: Self-pay | Admitting: Internal Medicine

## 2018-09-20 ENCOUNTER — Other Ambulatory Visit: Payer: Self-pay | Admitting: Family Medicine

## 2018-09-26 ENCOUNTER — Encounter: Payer: Self-pay | Admitting: Internal Medicine

## 2018-09-26 ENCOUNTER — Other Ambulatory Visit: Payer: Self-pay

## 2018-09-26 ENCOUNTER — Ambulatory Visit (INDEPENDENT_AMBULATORY_CARE_PROVIDER_SITE_OTHER): Payer: BC Managed Care – PPO | Admitting: Internal Medicine

## 2018-09-26 VITALS — BP 126/86 | HR 76 | Temp 97.9°F | Ht 69.5 in | Wt 229.0 lb

## 2018-09-26 DIAGNOSIS — E782 Mixed hyperlipidemia: Secondary | ICD-10-CM

## 2018-09-26 DIAGNOSIS — E11621 Type 2 diabetes mellitus with foot ulcer: Secondary | ICD-10-CM | POA: Diagnosis not present

## 2018-09-26 DIAGNOSIS — L97529 Non-pressure chronic ulcer of other part of left foot with unspecified severity: Secondary | ICD-10-CM

## 2018-09-26 DIAGNOSIS — I10 Essential (primary) hypertension: Secondary | ICD-10-CM | POA: Diagnosis not present

## 2018-09-26 DIAGNOSIS — F4323 Adjustment disorder with mixed anxiety and depressed mood: Secondary | ICD-10-CM

## 2018-09-26 DIAGNOSIS — E1140 Type 2 diabetes mellitus with diabetic neuropathy, unspecified: Secondary | ICD-10-CM

## 2018-09-26 DIAGNOSIS — Z0000 Encounter for general adult medical examination without abnormal findings: Secondary | ICD-10-CM | POA: Diagnosis not present

## 2018-09-26 DIAGNOSIS — E1165 Type 2 diabetes mellitus with hyperglycemia: Secondary | ICD-10-CM | POA: Diagnosis not present

## 2018-09-26 DIAGNOSIS — I2510 Atherosclerotic heart disease of native coronary artery without angina pectoris: Secondary | ICD-10-CM

## 2018-09-26 DIAGNOSIS — Z0001 Encounter for general adult medical examination with abnormal findings: Secondary | ICD-10-CM

## 2018-09-26 DIAGNOSIS — IMO0002 Reserved for concepts with insufficient information to code with codable children: Secondary | ICD-10-CM

## 2018-09-26 DIAGNOSIS — I213 ST elevation (STEMI) myocardial infarction of unspecified site: Secondary | ICD-10-CM

## 2018-09-26 MED ORDER — SULFAMETHOXAZOLE-TRIMETHOPRIM 800-160 MG PO TABS: 1.0000 | ORAL_TABLET | Freq: Two times a day (BID) | ORAL | 0 refills | Status: DC

## 2018-09-26 NOTE — Progress Notes (Signed)
Subjective:    Patient ID: Martin Mullen, male    DOB: 03-12-1980, 38 y.o.   MRN: [id number]  HPI  Pt presents to the clinic today for his annual exam. He is also due to follow up chronic conditions.  HTN: His BP today is 126/86. He is taking Lisinopril daly as prescribed. ECG from 07/2016 reviewed.  HLD with CAD s/p STEMI: His last LDL was 126, triglycerides 251, 06/2017. He denies myalgias on Simvastatin. He is taking Fish Oil OTC as well. He does not consume a low fat diet.   DM 2: His last A1C was 10.3%, 06/2017. His sugars range 121-350. He is taking Metformin, Glipizide as prescribed. He is not taking Novolog or Levemir due to cost. He does not check his feet routinely. His last eye exam was 08/2017.  Anxiety and Depression: Chronic but stable on Wellbutrin. He does not follow with a therapy. He denies SI/HI.  Left Great Toe, draining clear fluid. He does reports some redness and warmth. He has not seen his podiatrist.   Flu : 11/2016 Tetanus: 06/2012 Pneumovax 06/2017 Dentist: as needed, dentures  Diet: He does not eat much meat. He consumes some fruits and veggies. He does eat fried foods. He drinks mostly Mt. Dew. Exercise:   Review of Systems  Past Medical History:  Diagnosis Date   Acute transmural inferior wall MI ([place]) 08/28/2012   .5 x 12 mm Veri-flex stent non-DES.   Chicken pox    Coronary artery disease    Inferior ST elevation myocardial infarction in July of 2014. Cardiac catheterization showed 95% distal RCA stenosis and 90% first diagonal stenosis with normal ejection fraction. He underwent PCI in bare-metal stent placement to the distal RCA.   Diabetes mellitus without complication (HCC)    Hypercholesterolemia    Tobacco use     Current Outpatient Medications  Medication Sig Dispense Refill   aspirin 81 MG EC tablet Take 1 tablet (81 mg total) by mouth daily. 30 tablet 4   Blood Glucose Monitoring Suppl (BAYER CONTOUR NEXT MONITOR) w/Device KIT 1  Device 1 kit 0   buPROPion (WELLBUTRIN XL) 150 MG 24 hr tablet Take 1 tablet (150 mg total) by mouth daily. 90 tablet 3   CONTOUR NEXT TEST test strip USE 1 STRIP TO CHECK GLUCOSE THREE TIMES DAILY AS NEEDED 100 each 0   glipiZIDE (GLUCOTROL) 5 MG tablet Take 1 tablet (5 mg total) by mouth 2 (two) times daily before a meal. 180 tablet 0   insulin aspart (NOVOLOG) 100 UNIT/ML injection Inject 5 Units into the skin 2 (two) times daily. 10 mL 1   Insulin Pen Needle 31G X 6 MM MISC 1 each by Does not apply route daily. 50 each 11   LEVEMIR 100 UNIT/ML injection INJECT 23 UNITS SUBCUTANEOUSLY ONCE DAILY 10 mL 0   lisinopril (PRINIVIL,ZESTRIL) 10 MG tablet Take 1 tablet (10 mg total) by mouth daily. 90 tablet 3   metFORMIN (GLUCOPHAGE) 1000 MG tablet TAKE 1 TABLET BY MOUTH TWICE DAILY WITH MEALS 60 tablet 0   nitroGLYCERIN (NITROSTAT) 0.4 MG SL tablet DISSOLVE ONE TABLET UNDER THE TONGUE EVERY 5 MINUTES AS NEEDED FOR CHEST PAIN.  DO NOT EXCEED A TOTAL OF 3 DOSES IN 15 MINUTES 25 tablet 12   Omega-3 Fatty Acids (FISH OIL) 1200 MG CAPS Take 1,200 mg by mouth 3 (three) times daily.     simvastatin (ZOCOR) 80 MG tablet Take 1 tablet (80 mg total) by mouth daily. 90 tablet  3   sulfamethoxazole-trimethoprim (BACTRIM DS) 800-160 MG tablet Take 1 tablet by mouth 2 (two) times daily. 20 tablet 0   No current facility-administered medications for this visit.     No Known Allergies  Family History  Problem Relation Age of Onset   Arthritis Mother        Rheumatoid   Diabetes Mother    Hyperlipidemia Mother    Diabetes Father    Cancer Maternal Grandfather        Prostate   Parkinson's disease Maternal Grandfather     Social History   Socioeconomic History   Marital status: Married    Spouse name: Not on file   Number of children: Not on file   Years of education: Not on file   Highest education level: Not on file  Occupational History   Not on file  Social Needs    Financial resource strain: Not on file   Food insecurity    Worry: Not on file    Inability: Not on file   Transportation needs    Medical: Not on file    Non-medical: Not on file  Tobacco Use   Smoking status: Current Every Day Smoker    Packs/day: 1.50    Years: 20.00    Pack years: 30.00    Types: Cigarettes   Smokeless tobacco: Never Used  Substance and Sexual Activity   Alcohol use: Yes    Alcohol/week: 0.0 standard drinks    Comment: occasional   Drug use: No    Types: Cocaine   Sexual activity: Yes    Birth control/protection: None  Lifestyle   Physical activity    Days per week: Not on file    Minutes per session: Not on file   Stress: Not on file  Relationships   Social connections    Talks on phone: Not on file    Gets together: Not on file    Attends religious service: Not on file    Active member of club or organization: Not on file    Attends meetings of clubs or organizations: Not on file    Relationship status: Not on file   Intimate partner violence    Fear of current or ex partner: Not on file    Emotionally abused: Not on file    Physically abused: Not on file    Forced sexual activity: Not on file  Other Topics Concern   Not on file  Social History Narrative   Not on file     Constitutional: Denies fever, malaise, fatigue, headache or abrupt weight changes.  HEENT: Denies eye pain, eye redness, ear pain, ringing in the ears, wax buildup, runny nose, nasal congestion, bloody nose, or sore throat. Respiratory: Denies difficulty breathing, shortness of breath, cough or sputum production.   Cardiovascular: Denies chest pain, chest tightness, palpitations or swelling in the hands or feet.  Gastrointestinal: Denies abdominal pain, bloating, constipation, diarrhea or blood in the stool.  GU: Denies urgency, frequency, pain with urination, burning sensation, blood in urine, odor or discharge. Musculoskeletal: Denies decrease in range of  motion, difficulty with gait, muscle pain or joint pain and swelling.  Skin: Pt reports left great toe wound. Denies redness, rashes, lesions.  Neurological: Denies dizziness, difficulty with memory, difficulty with speech or problems with balance and coordination.  Psych: Pt has a history of anxiety and depression. Denies SI/HI.  No other specific complaints in a complete review of systems (except as listed in HPI above).  Objective:   Physical Exam   BP 126/86    Pulse 76    Temp 97.9 F (36.6 C) (Temporal)    Ht 5' 9.5" (1.765 m)    Wt 229 lb (103.9 kg)    SpO2 97%    BMI 33.33 kg/m   Wt Readings from Last 3 Encounters:  06/20/17 243 lb (110.2 kg)  03/30/17 233 lb (105.7 kg)  03/20/17 230 lb 12.8 oz (104.7 kg)    General: Appears his stated age, obese,  in NAD. Skin: Warm, dry and intact. See attached picture. HEENT: Head: normal shape and size; Eyes: sclera white, no icterus, conjunctiva pink, PERRLA and EOMs intact; Ears: Tm's gray and intact, normal light reflex;  Neck:  Neck supple, trachea midline. No masses, lumps or thyromegaly present.  Cardiovascular: Normal rate and rhythm. S1,S2 noted.  No murmur, rubs or gallops noted. No JVD or BLE edema.  Pulmonary/Chest: Normal effort and positive vesicular breath sounds. No respiratory distress. No wheezes, rales or ronchi noted.  Abdomen: Soft and nontender. Normal bowel sounds. No distention or masses noted. Liver, spleen and kidneys non palpable. Musculoskeletal: Strength 5/5 BUE/BLE. No difficulty with gait.  Neurological: Alert and oriented. Cranial nerves II-XII grossly intact. Coordination normal.  Psychiatric: Mood and affect normal. Behavior is normal. Judgment and thought content normal.      BMET    Component Value Date/Time   NA 134 (L) 06/20/2017 1510   NA 139 01/29/2013 0040   K 4.0 06/20/2017 1510   K 4.0 01/29/2013 0040   CL 98 06/20/2017 1510   CL 107 01/29/2013 0040   CO2 28 06/20/2017 1510   CO2  28 01/29/2013 0040   GLUCOSE 485 (H) 06/20/2017 1510   GLUCOSE 228 (H) 01/29/2013 0040   BUN 10 06/20/2017 1510   BUN 15 01/29/2013 0040   CREATININE 0.88 06/20/2017 1510   CREATININE 0.88 01/29/2013 0040   CALCIUM 9.4 06/20/2017 1510   CALCIUM 9.2 01/29/2013 0040   GFRNONAA >60 03/19/2017 0325   GFRNONAA >60 01/29/2013 0040   GFRAA >60 03/19/2017 0325   GFRAA >60 01/29/2013 0040    Lipid Panel     Component Value Date/Time   CHOL 181 06/20/2017 1510   TRIG 251.0 (H) 06/20/2017 1510   HDL 31.80 (L) 06/20/2017 1510   CHOLHDL 6 06/20/2017 1510   VLDL 50.2 (H) 06/20/2017 1510   LDLCALC 63 10/13/2015 1527    CBC    Component Value Date/Time   WBC 10.9 (H) 06/20/2017 1510   RBC 5.41 06/20/2017 1510   HGB 17.9 (H) 06/20/2017 1510   HGB 16.4 01/29/2013 0040   HCT 51.1 06/20/2017 1510   HCT 48.2 01/29/2013 0040   PLT 214.0 06/20/2017 1510   PLT 222 01/29/2013 0040   MCV 94.4 06/20/2017 1510   MCV 93 01/29/2013 0040   MCH 31.9 03/19/2017 0325   MCHC 35.1 06/20/2017 1510   RDW 13.7 06/20/2017 1510   RDW 13.8 01/29/2013 0040   LYMPHSABS 3.4 03/18/2017 1823   MONOABS 1.3 (H) 03/18/2017 1823   EOSABS 0.2 03/18/2017 1823   BASOSABS 0.1 03/18/2017 1823    Hgb A1C Lab Results  Component Value Date   HGBA1C 10.3 (H) 06/20/2017            Assessment & Plan:   Preventative Health Maintenance:  Encouraged him to get a flu shot in the fall Tetanus UTD Pneumovax UTD Encouraged him to consume a balanced diet and exercise regimen Advised him to see  an eye doctor and dentist annually Will check CBC, CMET, Lipid, A1C today  Diabetic Ulcer, Left Great Toe:  Septra DS refilled today Xray left great toe, r/o osteomyelitis Referral to Wound Clinic for further eval  Will follow up after labs, return precautions discussed Webb Silversmith, NP

## 2018-09-27 ENCOUNTER — Other Ambulatory Visit: Payer: BC Managed Care – PPO

## 2018-09-27 ENCOUNTER — Ambulatory Visit (INDEPENDENT_AMBULATORY_CARE_PROVIDER_SITE_OTHER)
Admission: RE | Admit: 2018-09-27 | Discharge: 2018-09-27 | Disposition: A | Discharge status: Home / Self Care | Payer: BC Managed Care – PPO | Source: Ambulatory Visit | Attending: Internal Medicine | Admitting: Internal Medicine

## 2018-09-27 ENCOUNTER — Encounter: Payer: Self-pay | Admitting: Internal Medicine

## 2018-09-27 DIAGNOSIS — E11621 Type 2 diabetes mellitus with foot ulcer: Secondary | ICD-10-CM | POA: Diagnosis not present

## 2018-09-27 DIAGNOSIS — L97529 Non-pressure chronic ulcer of other part of left foot with unspecified severity: Secondary | ICD-10-CM | POA: Diagnosis not present

## 2018-09-27 DIAGNOSIS — E118 Type 2 diabetes mellitus with unspecified complications: Secondary | ICD-10-CM | POA: Diagnosis not present

## 2018-09-27 LAB — COMPREHENSIVE METABOLIC PANEL
ALT: 16 IU/L (ref 0–44)
AST: 11 IU/L (ref 0–40)
Albumin/Globulin Ratio: 1.6 (ref 1.2–2.2)
Albumin: 4.2 g/dL (ref 4.0–5.0)
Alkaline Phosphatase: 120 IU/L — ABNORMAL HIGH (ref 39–117)
BUN/Creatinine Ratio: 12 (ref 9–20)
BUN: 11 mg/dL (ref 6–20)
Bilirubin Total: 0.2 mg/dL (ref 0.0–1.2)
CO2: 25 mmol/L (ref 20–29)
Calcium: 9.6 mg/dL (ref 8.7–10.2)
Chloride: 101 mmol/L (ref 96–106)
Creatinine, Ser: 0.9 mg/dL (ref 0.76–1.27)
GFR calc Af Amer: 125 mL/min/{1.73_m2} (ref >59)
GFR calc non Af Amer: 108 mL/min/{1.73_m2} (ref >59)
Globulin, Total: 2.6 g/dL (ref 1.5–4.5)
Glucose: 292 mg/dL — ABNORMAL HIGH (ref 65–99)
Potassium: 4.5 mmol/L (ref 3.5–5.2)
Sodium: 141 mmol/L (ref 134–144)
Total Protein: 6.8 g/dL (ref 6.0–8.5)

## 2018-09-27 LAB — CBC
Hematocrit: 49.4 % (ref 37.5–51.0)
Hemoglobin: 16.8 g/dL (ref 13.0–17.7)
MCH: 32.4 pg (ref 26.6–33.0)
MCHC: 34 g/dL (ref 31.5–35.7)
MCV: 95 fL (ref 79–97)
Platelets: 220 10*3/uL (ref 150–450)
RBC: 5.19 x10E6/uL (ref 4.14–5.80)
RDW: 12.2 % (ref 11.6–15.4)
WBC: 11.8 10*3/uL — ABNORMAL HIGH (ref 3.4–10.8)

## 2018-09-27 LAB — HEMOGLOBIN A1C
Est. average glucose Bld gHb Est-mCnc: 260 mg/dL
Hgb A1c MFr Bld: 10.7 % — ABNORMAL HIGH (ref 4.8–5.6)

## 2018-09-27 LAB — LIPID PANEL
Chol/HDL Ratio: 4.2 ratio (ref 0.0–5.0)
Cholesterol, Total: 163 mg/dL (ref 100–199)
HDL: 39 mg/dL — ABNORMAL LOW (ref >39)
LDL Calculated: 89 mg/dL (ref 0–99)
Triglycerides: 176 mg/dL — ABNORMAL HIGH (ref 0–149)
VLDL Cholesterol Cal: 35 mg/dL (ref 5–40)

## 2018-09-27 MED ORDER — GLIPIZIDE 10 MG PO TABS: 10.0000 mg | ORAL_TABLET | Freq: Two times a day (BID) | ORAL | 0 refills | Status: DC

## 2018-09-27 MED ORDER — METFORMIN HCL 1000 MG PO TABS: 1000.0000 mg | ORAL_TABLET | Freq: Two times a day (BID) | ORAL | 0 refills | Status: DC

## 2018-09-27 MED ORDER — SIMVASTATIN 80 MG PO TABS: 80.0000 mg | ORAL_TABLET | Freq: Every day | ORAL | 3 refills | Status: DC

## 2018-09-27 NOTE — Assessment & Plan Note (Signed)
No angina Continue Simvastatin He will continue to follow with cardiology

## 2018-09-27 NOTE — Addendum Note (Signed)
Addended by: Lurlean Nanny on: 09/27/2018 01:07 PM   Modules accepted: Orders

## 2018-09-27 NOTE — Assessment & Plan Note (Signed)
CMET and Lipid profile today Encouraged him to consume a low fat diet Continue Simvastatin for now 

## 2018-09-27 NOTE — Assessment & Plan Note (Signed)
No issues He follows with cardiology

## 2018-09-27 NOTE — Addendum Note (Signed)
Addended by: Ellamae Sia on: 09/27/2018 02:17 PM   Modules accepted: Orders

## 2018-09-27 NOTE — Assessment & Plan Note (Signed)
Controlled on Lisinopril CMET today Reinforced DASH diet and exercise for weight loss Will monitor 

## 2018-09-27 NOTE — Assessment & Plan Note (Signed)
Controlled on Wellbutrin Support offered today Will monitor

## 2018-09-27 NOTE — Addendum Note (Signed)
Addended by: Ellamae Sia on: 09/27/2018 02:14 PM   Modules accepted: Orders

## 2018-09-27 NOTE — Assessment & Plan Note (Signed)
A1C today No microalbumin due to ACEI therapy Encouraged him to consume a low carb diet and exercise for weight loss Continue Metformin and Glipizide for now Will send in Levemir Referral to endocrinology placed Foot exam done- referral to wound clinic Encouraged him to get a flu shot in the fall Pneumovax UTD

## 2018-09-27 NOTE — Patient Instructions (Signed)

## 2018-09-28 LAB — MICROALBUMIN / CREATININE URINE RATIO
Creatinine, Urine: 46.7 mg/dL
Microalb/Creat Ratio: 18 mg/g creat (ref 0–29)
Microalbumin, Urine: 8.3 ug/mL

## 2018-09-29 ENCOUNTER — Other Ambulatory Visit: Payer: Self-pay | Admitting: Internal Medicine

## 2018-10-11 ENCOUNTER — Encounter: Payer: BC Managed Care – PPO | Attending: Physician Assistant | Admitting: Physician Assistant

## 2018-10-11 ENCOUNTER — Other Ambulatory Visit: Payer: Self-pay

## 2018-10-11 DIAGNOSIS — Z955 Presence of coronary angioplasty implant and graft: Secondary | ICD-10-CM | POA: Insufficient documentation

## 2018-10-11 DIAGNOSIS — I10 Essential (primary) hypertension: Secondary | ICD-10-CM | POA: Diagnosis not present

## 2018-10-11 DIAGNOSIS — I252 Old myocardial infarction: Secondary | ICD-10-CM | POA: Insufficient documentation

## 2018-10-11 DIAGNOSIS — E11621 Type 2 diabetes mellitus with foot ulcer: Secondary | ICD-10-CM | POA: Diagnosis not present

## 2018-10-11 DIAGNOSIS — I2510 Atherosclerotic heart disease of native coronary artery without angina pectoris: Secondary | ICD-10-CM | POA: Insufficient documentation

## 2018-10-11 DIAGNOSIS — E785 Hyperlipidemia, unspecified: Secondary | ICD-10-CM | POA: Insufficient documentation

## 2018-10-11 DIAGNOSIS — L97522 Non-pressure chronic ulcer of other part of left foot with fat layer exposed: Secondary | ICD-10-CM | POA: Diagnosis not present

## 2018-10-11 DIAGNOSIS — E1142 Type 2 diabetes mellitus with diabetic polyneuropathy: Secondary | ICD-10-CM | POA: Insufficient documentation

## 2018-10-11 NOTE — Progress Notes (Signed)
WHITFIELD, DULAY ([id number]) Visit Report for 10/11/2018 Abuse/Suicide Risk Screen Details Patient Name: Martin Mullen, Martin Mullen Date of Service: 10/11/2018 2:15 PM Medical Record Number: [id number] Patient Account Number: [id number] Date of Birth/Sex: 09-04-1980 (38 y.o. M) Treating RN: Montey Hora Primary Care Kashonda Sarkisyan: Webb Silversmith Other Clinician: Referring Demetrie Borge: Webb Silversmith Treating Danetta Prom/Extender: STONE III, HOYT Weeks in Treatment: 0 Abuse/Suicide Risk Screen Items Answer ABUSE RISK SCREEN: Has anyone close to you tried to hurt or harm you recentlyo No Do you feel uncomfortable with anyone in your familyo No Has anyone forced you do things that you didnot want to doo No Electronic Signature(s) Signed: 10/11/2018 4:37:52 PM By: Montey Hora Entered By: Montey Hora on 10/11/2018 14:46:04 Martin Mullen ([id number]) -------------------------------------------------------------------------------- Activities of Daily Living Details Patient Name: Martin Mullen Date of Service: 10/11/2018 2:15 PM Medical Record Number: [id number] Patient Account Number: [id number] Date of Birth/Sex: [birth year] (38 y.o. M) Treating RN: Montey Hora Primary Care Makalia Bare: Webb Silversmith Other Clinician: Referring Kimiyah Blick: Webb Silversmith Treating Oberon Hehir/Extender: STONE III, HOYT Weeks in Treatment: 0 Activities of Daily Living Items Answer Activities of Daily Living (Please select one for each item) Drive Automobile Completely Able Take Medications Completely Able Use Telephone Completely Able Care for Appearance Completely Able Use Toilet Completely Able Bath / Shower Completely Able Dress Self Completely Able Feed Self Completely Able Walk Completely Able Get In / Out Bed Completely Able Housework Completely Able Prepare Meals Completely [facility] for Self Completely Able Electronic Signature(s) Signed: 10/11/2018 4:37:52 PM By: Montey Hora Entered  By: Montey Hora on 10/11/2018 14:46:30 Martin Mullen ([id number]) -------------------------------------------------------------------------------- Education Screening Details Patient Name: Martin Mullen Date of Service: 10/11/2018 2:15 PM Medical Record Number: [id number] Patient Account Number: [id number] Date of Birth/Sex: November 25, 1980 (38 y.o. M) Treating RN: Montey Hora Primary Care Reino Lybbert: Webb Silversmith Other Clinician: Referring Valor Quaintance: Webb Silversmith Treating Raquel Racey/Extender: Melburn Hake, HOYT Weeks in Treatment: 0 Primary Learner Assessed: Patient Learning Preferences/Education Level/Primary Language Learning Preference: Explanation, Demonstration Highest Education Level: High School Preferred Language: English Cognitive Barrier Language Barrier: No Translator Needed: No Memory Deficit: No Emotional Barrier: No Cultural/Religious Beliefs Affecting Medical Care: No Physical Barrier Impaired Vision: No Impaired Hearing: No Decreased Hand dexterity: No Knowledge/Comprehension Knowledge Level: Medium Comprehension Level: Medium Ability to understand written Medium instructions: Ability to understand verbal Medium instructions: Motivation Anxiety Level: Calm Cooperation: Cooperative Education Importance: Acknowledges Need Interest in Health Problems: Asks Questions Perception: Coherent Willingness to Engage in Self- Medium Management Activities: Readiness to Engage in Self- Medium Management Activities: Electronic Signature(s) Signed: 10/11/2018 4:37:52 PM By: Montey Hora Entered By: Montey Hora on 10/11/2018 14:46:58 RONDALE, [name] ([id number]) -------------------------------------------------------------------------------- Fall Risk Assessment Details Patient Name: Martin Mullen Date of Service: 10/11/2018 2:15 PM Medical Record Number: [id number] Patient Account Number: [id number] Date of Birth/Sex: [birth year] (38 y.o. M) Treating RN: Montey Hora Primary Care Garry Bochicchio: Webb Silversmith Other Clinician: Referring Marcey Persad: Webb Silversmith Treating Malonie Tatum/Extender: Melburn Hake, HOYT Weeks in Treatment: 0 Fall Risk Assessment Items Have you had 2 or more falls in the last 12 monthso 0 No Have you had any fall that resulted in injury in the last 12 monthso 0 No FALLS RISK SCREEN History of falling - immediate or within 3 months 0 No Secondary diagnosis (Do you have 2 or more medical diagnoseso) 0 No Ambulatory aid None/bed rest/wheelchair/nurse 0 Yes Crutches/cane/walker 0 No Furniture 0 No Intravenous therapy Access/Saline/Heparin Lock 0 No Gait/Transferring Normal/ bed rest/ wheelchair 0 Yes Weak (short steps with or without shuffle, stooped but  able to lift head while 0 No walking, may seek support from furniture) Impaired (short steps with shuffle, may have difficulty arising from chair, head 0 No down, impaired balance) Mental Status Oriented to own ability 0 Yes Electronic Signature(s) Signed: 10/11/2018 4:37:52 PM By: Curtis Sites Entered By: Curtis Sites on 10/11/2018 14:47:14 Martin Mullen ([id number]) -------------------------------------------------------------------------------- Foot Assessment Details Patient Name: Martin Mullen Date of Service: 10/11/2018 2:15 PM Medical Record Number: [id number] Patient Account Number: [id number] Date of Birth/Sex: 10-Sep-1980 (37 y.o. M) Treating RN: Curtis Sites Primary Care Aubrionna Istre: [name] Other Clinician: Referring Cookie Pore: [name] Treating Korayma Hagwood/Extender: Linwood Dibbles, HOYT Weeks in Treatment: 0 Foot Assessment Items Site Locations + = Sensation present, - = Sensation absent, C = Callus, U = Ulcer R = Redness, W = Warmth, M = Maceration, PU = Pre-ulcerative lesion F = Fissure, S = Swelling, D = Dryness Assessment Right: Left: Other Deformity: No No Prior Foot Ulcer: No No Prior Amputation: No No Charcot Joint: No No Ambulatory Status:  Ambulatory Without Help Gait: Steady Electronic Signature(s) Signed: 10/11/2018 4:37:52 PM By: Curtis Sites Entered By: Curtis Sites on 10/11/2018 14:58:14 Martin Mullen ([id number]) -------------------------------------------------------------------------------- Nutrition Risk Screening Details Patient Name: Martin Mullen Date of Service: 10/11/2018 2:15 PM Medical Record Number: [id number] Patient Account Number: [id number] Date of Birth/Sex: [birth year] (38 y.o. M) Treating RN: Curtis Sites Primary Care Sakshi Sermons: [name] Other Clinician: Referring Ambrielle Kington: [name] Treating Lillyn Wieczorek/Extender: STONE III, HOYT Weeks in Treatment: 0 Height (in): 70 Weight (lbs): 230 Body Mass Index (BMI): 33 Nutrition Risk Screening Items Score Screening NUTRITION RISK SCREEN: I have an illness or condition that made me change the kind and/or amount of 0 No food I eat I eat fewer than two meals per day 0 No I eat few fruits and vegetables, or milk products 0 No I have three or more drinks of beer, liquor or wine almost every day 0 No I have tooth or mouth problems that make it hard for me to eat 0 No I don't always have enough money to buy the food I need 0 No I eat alone most of the time 0 No I take three or more different prescribed or over-the-counter drugs a day 1 Yes Without wanting to, I have lost or gained 10 pounds in the last six months 0 No I am not always physically able to shop, cook and/or feed myself 0 No Nutrition Protocols Good Risk Protocol 0 No interventions needed Moderate Risk Protocol High Risk Proctocol Risk Level: Good Risk Score: 1 Electronic Signature(s) Signed: 10/11/2018 4:37:52 PM By: Curtis Sites Entered By: Curtis Sites on 10/11/2018 14:47:38

## 2018-10-12 NOTE — Progress Notes (Signed)
TAN, CLOPPER ([id number]) Visit Report for 10/11/2018 Allergy List Details Patient Name: Martin Mullen, Martin Mullen Date of Service: 10/11/2018 2:15 PM Medical Record Number: [id number] Patient Account Number: [id number] Date of Birth/Sex: 09-22-1980 (38 y.o. M) Treating RN: Montey Hora Primary Care Sirius Woodford: Webb Silversmith Other Clinician: Referring Thedford Bunton: Webb Silversmith Treating Ahlijah Raia/Extender: STONE III, HOYT Weeks in Treatment: 0 Allergies Active Allergies No Known Drug Allergies Allergy Notes Electronic Signature(s) Signed: 10/11/2018 4:37:52 PM By: Montey Hora Entered By: Montey Hora on 10/11/2018 14:42:06 Martin Mullen ([id number]) -------------------------------------------------------------------------------- Arrival Information Details Patient Name: Martin Mullen Date of Service: 10/11/2018 2:15 PM Medical Record Number: [id number] Patient Account Number: [id number] Date of Birth/Sex: [birth year] (38 y.o. M) Treating RN: Army Melia Primary Care Susannah Carbin: Webb Silversmith Other Clinician: Referring Tandy Grawe: Webb Silversmith Treating Obie Kallenbach/Extender: Melburn Hake, HOYT Weeks in Treatment: 0 Visit Information Patient Arrived: Ambulatory Arrival Time: 14:22 Accompanied By: self Transfer Assistance: None Patient Identification Verified: Yes Secondary Verification Process Completed: Yes Electronic Signature(s) Signed: 10/11/2018 4:40:32 PM By: Lorine Bears RCP, RRT, CHT Entered By: Lorine Bears on 10/11/2018 14:22:54 Martin Mullen ([id number]) -------------------------------------------------------------------------------- Clinic Level of Care Assessment Details Patient Name: Martin Mullen Date of Service: 10/11/2018 2:15 PM Medical Record Number: [id number] Patient Account Number: [id number] Date of Birth/Sex: [birth year] (38 y.o. M) Treating RN: Army Melia Primary Care Tomisha Reppucci: Webb Silversmith Other Clinician: Referring Raul Winterhalter: Webb Silversmith Treating Gavyn Ybarra/Extender: Melburn Hake, HOYT Weeks in Treatment: 0 Clinic Level of Care Assessment Items TOOL 1 Quantity Score []  - Use when EandM and Procedure is performed on INITIAL visit 0 ASSESSMENTS - Nursing Assessment / Reassessment X - General Physical Exam (combine w/ comprehensive assessment (listed just below) when 1 20 performed on new pt. evals) X- 1 25 Comprehensive Assessment (HX, ROS, Risk Assessments, Wounds Hx, etc.) ASSESSMENTS - Wound and Skin Assessment / Reassessment []  - Dermatologic / Skin Assessment (not related to wound area) 0 ASSESSMENTS - Ostomy and/or Continence Assessment and Care []  - Incontinence Assessment and Management 0 []  - 0 Ostomy Care Assessment and Management (repouching, etc.) PROCESS - Coordination of Care X - Simple Patient / Family Education for ongoing care 1 15 []  - 0 Complex (extensive) Patient / Family Education for ongoing care X- 1 10 Staff obtains Programmer, systems, Records, Test Results / Process Orders []  - 0 Staff telephones HHA, Nursing Homes / Clarify orders / etc []  - 0 Routine Transfer to another Facility (non-emergent condition) []  - 0 Routine Hospital Admission (non-emergent condition) X- 1 15 New Admissions / Biomedical engineer / Ordering NPWT, Apligraf, etc. []  - 0 Emergency Hospital Admission (emergent condition) PROCESS - Special Needs []  - Pediatric / Minor Patient Management 0 []  - 0 Isolation Patient Management []  - 0 Hearing / Language / Visual special needs []  - 0 Assessment of Community assistance (transportation, D/C planning, etc.) []  - 0 Additional assistance / Altered mentation []  - 0 Support Surface(s) Assessment (bed, cushion, seat, etc.) ALEXEI, DOSWELL ([id number]) INTERVENTIONS - Miscellaneous []  - External ear exam 0 []  - 0 Patient Transfer (multiple staff / Civil Service fast streamer / Similar devices) []  - 0 Simple Staple / Suture removal (25 or less) []  - 0 Complex Staple / Suture removal (26  or more) []  - 0 Hypo/Hyperglycemic Management (do not check if billed separately) X- 1 15 Ankle / Brachial Index (ABI) - do not check if billed separately Has the patient been seen at the hospital within the last three years: Yes Total Score: 100 Level Of Care: New/Established - Level 3 Electronic Signature(s)  Signed: 10/11/2018 4:38:54 PM By: Rodell Perna Entered By: Rodell Perna on 10/11/2018 15:31:26 Martin Mullen ([id number]) -------------------------------------------------------------------------------- Encounter Discharge Information Details Patient Name: Martin Mullen Date of Service: 10/11/2018 2:15 PM Medical Record Number: [id number] Patient Account Number: [id number] Date of Birth/Sex: 12-02-1980 (38 y.o. M) Treating RN: Arnette Norris Primary Care Nykole Matos: [name] Other Clinician: Referring Elson Ulbrich: [name] Treating Amore Grater/Extender: Linwood Dibbles, HOYT Weeks in Treatment: 0 Encounter Discharge Information Items Post Procedure Vitals Discharge Condition: Stable Temperature (F): 98.3 Ambulatory Status: Ambulatory Pulse (bpm): 77 Discharge Destination: Home Respiratory Rate (breaths/min): 16 Transportation: Private Auto Blood Pressure (mmHg): 130/74 Accompanied By: self Schedule Follow-up Appointment: Yes Clinical Summary of Care: Electronic Signature(s) Signed: 10/11/2018 4:43:55 PM By: Arnette Norris Entered By: Arnette Norris on 10/11/2018 15:44:07 Martin Mullen ([id number]) -------------------------------------------------------------------------------- Lower Extremity Assessment Details Patient Name: Martin Mullen Date of Service: 10/11/2018 2:15 PM Medical Record Number: [id number] Patient Account Number: [id number] Date of Birth/Sex: 08-Feb-1981 (38 y.o. M) Treating RN: Curtis Sites Primary Care Gaila Engebretsen: [name] Other Clinician: Referring Sylvester Salonga: [name] Treating Kathern Lobosco/Extender: STONE III, HOYT Weeks in Treatment: 0 Edema  Assessment Assessed: [Left: No] [Right: No] Edema: [Left: No] [Right: No] Vascular Assessment Pulses: Dorsalis Pedis Palpable: [Left:Yes] [Right:Yes] Doppler Audible: [Left:Yes] [Right:Yes] Posterior Tibial Palpable: [Left:Yes] [Right:Yes] Doppler Audible: [Left:Yes] [Right:Yes] Blood Pressure: Brachial: [Left:90] [Right:110] Dorsalis Pedis: 160 [Left:Dorsalis Pedis: 150] Ankle: Posterior Tibial: 160 [Left:Posterior Tibial: 148 1.45] [Right:1.36] Electronic Signature(s) Signed: 10/11/2018 4:37:52 PM By: Curtis Sites Entered By: Curtis Sites on 10/11/2018 14:55:05 Martin Mullen ([id number]) -------------------------------------------------------------------------------- Multi Wound Chart Details Patient Name: Martin Mullen Date of Service: 10/11/2018 2:15 PM Medical Record Number: [id number] Patient Account Number: [id number] Date of Birth/Sex: [birth year] (38 y.o. M) Treating RN: Rodell Perna Primary Care Akito Boomhower: [name] Other Clinician: Referring Janella Rogala: [name] Treating Tramayne Sebesta/Extender: Linwood Dibbles, HOYT Weeks in Treatment: 0 Vital Signs Height(in): 70 Pulse(bpm): 77 Weight(lbs): 230 Blood Pressure(mmHg): 130/74 Body Mass Index(BMI): 33 Temperature(F): 98.3 Respiratory Rate 16 (breaths/min): Photos: [N/A:N/A] Wound Location: Left Toe Great N/A N/A Wounding Event: Gradually Appeared N/A N/A Primary Etiology: Diabetic Wound/Ulcer of the N/A N/A Lower Extremity Comorbid History: Coronary Artery Disease, N/A N/A Hypertension, Myocardial Infarction, Type II Diabetes Date Acquired: 07/16/2018 N/A N/A Weeks of Treatment: 0 N/A N/A Wound Status: Open N/A N/A Pending Amputation on Yes N/A N/A Presentation: Measurements L x W x D 0.1x0.1x0.7 N/A N/A (cm) Area (cm) : 0.008 N/A N/A Volume (cm) : 0.005 N/A N/A Classification: Grade 1 N/A N/A Exudate Amount: None Present N/A N/A Wound Margin: Flat and Intact N/A N/A Granulation Amount: None Present  (0%) N/A N/A Necrotic Amount: None Present (0%) N/A N/A Exposed Structures: Fat Layer (Subcutaneous N/A N/A Tissue) Exposed: Yes Fascia: No Tendon: No Muscle: No Joint: No Bone: No Epithelialization: None N/A N/A PHOENIXX, HOCHBERG ([id number]) Assessment Notes: unable to visualize wound bed N/A N/A Treatment Notes Electronic Signature(s) Signed: 10/11/2018 4:38:54 PM By: Rodell Perna Entered By: Rodell Perna on 10/11/2018 15:09:11 Martin Mullen ([id number]) -------------------------------------------------------------------------------- Multi-Disciplinary Care Plan Details Patient Name: Martin Mullen Date of Service: 10/11/2018 2:15 PM Medical Record Number: [id number] Patient Account Number: [id number] Date of Birth/Sex: [birth year] (38 y.o. M) Treating RN: Rodell Perna Primary Care Anastacia Reinecke: [name] Other Clinician: Referring Ronon Ferger: [name] Treating Cheila Wickstrom/Extender: Linwood Dibbles, HOYT Weeks in Treatment: 0 Active Inactive Nutrition Nursing Diagnoses: Potential for alteratiion in Nutrition/Potential for imbalanced nutrition Goals: Patient/caregiver verbalizes understanding of need to maintain therapeutic glucose control per primary care physician Date Initiated: 10/11/2018 Target Resolution Date: 10/25/2018 Goal Status:  Active Interventions: Assess HgA1c results as ordered upon admission and as needed Notes: Orientation to the Wound Care Program Nursing Diagnoses: Knowledge deficit related to the wound healing center program Goals: Patient/caregiver will verbalize understanding of the Wound Healing Center Program Date Initiated: 10/11/2018 Target Resolution Date: 10/19/2018 Goal Status: Active Interventions: Provide education on orientation to the wound center Notes: Wound/Skin Impairment Nursing Diagnoses: Impaired tissue integrity Goals: Ulcer/skin breakdown will have a volume reduction of 30% by week 4 Date Initiated: 10/11/2018 Target Resolution Date:  10/19/2018 Goal Status: Active Interventions: Assess ulceration(s) every visit Martin Mullen, Martin Mullen ([id number]) Notes: Electronic Signature(s) Signed: 10/11/2018 4:38:54 PM By: Rodell PernaScott, Dajea Entered By: Rodell PernaScott, Dajea on 10/11/2018 15:08:26 Martin Mullen, Jianni ([id number]) -------------------------------------------------------------------------------- Pain Assessment Details Patient Name: Martin Mullen, Martin Mullen Date of Service: 10/11/2018 2:15 PM Medical Record Number: [id number] Patient Account Number: [id number] Date of Birth/Sex: [birth year] (38 y.o. M) Treating RN: Rodell PernaScott, Dajea Primary Care Lexys Milliner: Nicki ReaperBAITY, REGINA Other Clinician: Referring Jamelia Varano: Nicki ReaperBAITY, REGINA Treating Dwight Burdo/Extender: Linwood DibblesSTONE III, HOYT Weeks in Treatment: 0 Active Problems Location of Pain Severity and Description of Pain Patient Has Paino No Site Locations Pain Management and Medication Current Pain Management: Electronic Signature(s) Signed: 10/11/2018 4:38:54 PM By: Rodell PernaScott, Dajea Signed: 10/11/2018 4:40:32 PM By: Dayton MartesWallace, RCP,RRT,CHT, Sallie RCP, RRT, CHT Entered By: Dayton MartesWallace, RCP,RRT,CHT, Sallie on 10/11/2018 14:26:41 Martin Mullen, Martin Mullen ([id number]) -------------------------------------------------------------------------------- Patient/Caregiver Education Details Patient Name: Martin Mullen, Martin Mullen Date of Service: 10/11/2018 2:15 PM Medical Record Number: [id number] Patient Account Number: [id number] Date of Birth/Gender: [birth year] (38 y.o. M) Treating RN: Rodell PernaScott, Dajea Primary Care Physician: Nicki ReaperBAITY, REGINA Other Clinician: Referring Physician: Nicki ReaperBAITY, REGINA Treating Physician/Extender: Linwood DibblesSTONE III, HOYT Weeks in Treatment: 0 Education Assessment Education Provided To: Patient Education Topics Provided Wound/Skin Impairment: Handouts: Caring for Your Ulcer Methods: Demonstration, Explain/Verbal Responses: State content correctly Electronic Signature(s) Signed: 10/11/2018 4:38:54 PM By: Rodell PernaScott, Dajea Entered By: Rodell PernaScott, Dajea on  10/11/2018 15:31:51 Martin Mullen, Martin Mullen ([id number]) -------------------------------------------------------------------------------- Wound Assessment Details Patient Name: Martin Mullen, Martin Mullen Date of Service: 10/11/2018 2:15 PM Medical Record Number: [id number] Patient Account Number: [id number] Date of Birth/Sex: [birth year] (38 y.o. M) Treating RN: Curtis Sitesorthy, Joanna Primary Care Segundo Makela: Nicki ReaperBAITY, REGINA Other Clinician: Referring Sarra Rachels: Nicki ReaperBAITY, REGINA Treating Jorgina Binning/Extender: STONE III, HOYT Weeks in Treatment: 0 Wound Status Wound Number: 1 Primary Diabetic Wound/Ulcer of the Lower Extremity Etiology: Wound Location: Left Toe Great Wound Open Wounding Event: Gradually Appeared Status: Date Acquired: 07/16/2018 Comorbid Coronary Artery Disease, Hypertension, Weeks Of Treatment: 0 History: Myocardial Infarction, Type II Diabetes Clustered Wound: No Pending Amputation On Presentation Photos Wound Measurements Length: (cm) 0.1 % Reduction i Width: (cm) 0.1 % Reduction i Depth: (cm) 0.7 Epithelializa Area: (cm) 0.008 Tunneling: Volume: (cm) 0.005 Undermining: n Area: n Volume: tion: None No No Wound Description Classification: Grade 1 Foul Odor Af Wound Margin: Flat and Intact Slough/Fibri Exudate Amount: None Present ter Cleansing: No no No Wound Bed Granulation Amount: None Present (0%) Exposed Structure Necrotic Amount: None Present (0%) Fascia Exposed: No Fat Layer (Subcutaneous Tissue) Exposed: Yes Tendon Exposed: No Muscle Exposed: No Joint Exposed: No Bone Exposed: No Assessment Notes unable to visualize wound bed Martin Mullen, Martin Mullen ([id number]) Electronic Signature(s) Signed: 10/11/2018 4:37:52 PM By: Curtis Sitesorthy, Joanna Entered By: Curtis Sitesorthy, Joanna on 10/11/2018 14:53:25 Martin Mullen, Martin Mullen ([id number]) -------------------------------------------------------------------------------- Vitals Details Patient Name: Martin Mullen, Martin Mullen Date of Service: 10/11/2018 2:15 PM Medical Record  Number: [id number] Patient Account Number: [id number] Date of Birth/Sex: [birth year] (38 y.o. M) Treating RN: Rodell PernaScott, Dajea Primary Care Christien Berthelot: Nicki ReaperBAITY, REGINA Other Clinician: Referring Albion Weatherholtz: Nicki ReaperBAITY, REGINA Treating Jalynn Betzold/Extender: STONE III, HOYT Weeks in Treatment:  0 Vital Signs Time Taken: 14:25 Temperature (F): 98.3 Height (in): 70 Pulse (bpm): 77 Source: Stated Respiratory Rate (breaths/min): 16 Weight (lbs): 230 Blood Pressure (mmHg): 130/74 Source: Measured Reference Range: 80 - 120 mg / dl Body Mass Index (BMI): 33 Electronic Signature(s) Signed: 10/11/2018 4:40:32 PM By: Dayton MartesWallace, RCP,RRT,CHT, Sallie RCP, RRT, CHT Entered By: Dayton MartesWallace, RCP,RRT,CHT, Sallie on 10/11/2018 14:28:01

## 2018-10-14 NOTE — Progress Notes (Signed)
Martin Mullen ([id number]) Visit Report for 10/11/2018 Chief Complaint Document Details Patient Name: Martin Mullen, Martin Mullen Date of Service: 10/11/2018 2:15 PM Medical Record Number: [id number] Patient Account Number: [id number] Date of Birth/Sex: Jun 27, 1980 (38 y.o. M) Treating RN: Rodell Perna Primary Care Provider: [name] Other Clinician: Referring Provider: [name] Treating Provider/Extender: Linwood Dibbles, Harlie Ragle Weeks in Treatment: 0 Information Obtained from: Patient Chief Complaint left great toe ulcer Electronic Signature(s) Signed: 10/11/2018 3:05:47 PM By: Lenda Kelp PA-C Entered By: Lenda Kelp on 10/11/2018 15:05:47 Martin Mullen ([id number]) -------------------------------------------------------------------------------- Debridement Details Patient Name: Martin Mullen Date of Service: 10/11/2018 2:15 PM Medical Record Number: [id number] Patient Account Number: [id number] Date of Birth/Sex: 14-Nov-1980 (38 y.o. M) Treating RN: Rodell Perna Primary Care Provider: [name] Other Clinician: Referring Provider: [name] Treating Provider/Extender: Linwood Dibbles, Paytience Bures Weeks in Treatment: 0 Debridement Performed for Wound #1 Left Toe Great Assessment: Performed By: Physician STONE III, Anterrio Mccleery E., PA-C Debridement Type: Debridement Severity of Tissue Pre Fat layer exposed Debridement: Level of Consciousness (Pre- Awake and Alert procedure): Pre-procedure Verification/Time Yes - 15:16 Out Taken: Start Time: 15:16 Pain Control: Lidocaine Total Area Debrided (L x W): 0.1 (cm) x 0.1 (cm) = 0.01 (cm) Tissue and other material Viable, Non-Viable, Callus, Subcutaneous debrided: Level: Skin/Subcutaneous Tissue Debridement Description: Excisional Instrument: Curette Bleeding: Minimum Hemostasis Achieved: Pressure End Time: 15:18 Response to Treatment: Procedure was tolerated well Level of Consciousness Awake and Alert (Post-procedure): Post Debridement  Measurements of Total Wound Length: (cm) 0.6 Width: (cm) 0.3 Depth: (cm) 0.4 Volume: (cm) 0.057 Character of Wound/Ulcer Post Debridement: Stable Severity of Tissue Post Debridement: Fat layer exposed Post Procedure Diagnosis Same as Pre-procedure Electronic Signature(s) Signed: 10/11/2018 4:38:54 PM By: Rodell Perna Signed: 10/14/2018 6:59:38 PM By: Lenda Kelp PA-C Entered By: Rodell Perna on 10/11/2018 15:24:22 Martin Mullen ([id number]) -------------------------------------------------------------------------------- HPI Details Patient Name: Martin Mullen Date of Service: 10/11/2018 2:15 PM Medical Record Number: [id number] Patient Account Number: [id number] Date of Birth/Sex: 05-27-1980 (38 y.o. M) Treating RN: Rodell Perna Primary Care Provider: [name] Other Clinician: Referring Provider: [name] Treating Provider/Extender: Linwood Dibbles, Titilayo Hagans Weeks in Treatment: 0 History of Present Illness HPI Description: 10/11/18 patient presents today for initial evaluation our clinic. He is being seen due to a left great toe ulcer which unfortunately has been present for quite some time. Initially he thought it really wasn't much of anything and then subsequently noted that he had some drainage then that seem to dry off and he didn't worry too much about it until more recently when he is had issues. 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 Subsequently he tells me he has not utilized any recreational drugs for two years. This is obviously excellent news. He also does have diabetes type II. It does sound like he is trying to improve things as far as his overall health status is concerned although his hemoglobin A-1 C as measured on 09/27/18 was 10.7. He did have an x-ray as well on 09/28/18 and this was negative for any evidence of acute or chronic osteomyelitis or  fracture. Patient's wound is not appear to be severely infected at this time which is good news. With that being said he also doesn't really have any pain this is secondary to neuropathy. No fevers, chills, nausea, or vomiting noted at this time. Electronic Signature(s) Signed: 10/14/2018 6:59:38 PM By: Lenda Kelp PA-C  Entered By: Worthy Keeler on 10/14/2018 18:56:12 Martin Mullen, Martin Mullen ([id number]) -------------------------------------------------------------------------------- Physical Exam Details Patient Name: Martin Mullen, Martin Mullen Date of Service: 10/11/2018 2:15 PM Medical Record Number: [id number] Patient Account Number: [id number] Date of Birth/Sex: 1980/02/17 (38 y.o. M) Treating RN: Army Melia Primary Care Provider: Webb Silversmith Other Clinician: Referring Provider: Webb Silversmith Treating Provider/Extender: STONE III, Khyli Swaim Weeks in Treatment: 0 Constitutional patient is hypertensive.. pulse regular and within target range for patient.Marland Kitchen respirations regular, non-labored and within target range for patient.Marland Kitchen temperature within target range for patient.. Well-nourished and well-hydrated in no acute distress. Eyes conjunctiva clear no eyelid edema noted. pupils equal round and reactive to light and accommodation. Ears, Nose, Mouth, and Throat no gross abnormality of ear auricles or external auditory canals. normal hearing noted during conversation. mucus membranes moist. Respiratory normal breathing without difficulty. clear to auscultation bilaterally. Cardiovascular regular rate and rhythm with normal S1, S2. 2+ dorsalis pedis/posterior tibialis pulses. no clubbing, cyanosis, significant edema, <3 sec cap refill. Gastrointestinal (GI) soft, non-tender, non-distended, +BS. no ventral hernia noted. Musculoskeletal normal gait and posture. no significant deformity or arthritic changes, no loss or range of motion, no clubbing. Psychiatric this patient is able to make decisions and  demonstrates good insight into disease process. Alert and Oriented x 3. pleasant and cooperative. Notes Upon evaluation patient's wound bed actually is gonna require sharp debridement today to clear away sloughing necrotic material from the surface of the wound. He actually tolerated debridement at this point without complication and post debridement wound bed appears to be doing much better. In fact it appears to have a very healthy wound bed and there does not appear to be any evidence of infection nor structure exposure which is good news. Electronic Signature(s) Signed: 10/14/2018 6:59:38 PM By: Worthy Keeler PA-C Entered By: Worthy Keeler on 10/14/2018 18:56:51 Martin Mullen, Martin Mullen ([id number]) -------------------------------------------------------------------------------- Physician Orders Details Patient Name: Martin Mullen Date of Service: 10/11/2018 2:15 PM Medical Record Number: [id number] Patient Account Number: [id number] Date of Birth/Sex: 1980/03/24 (38 y.o. M) Treating RN: Army Melia Primary Care Provider: Webb Silversmith Other Clinician: Referring Provider: Webb Silversmith Treating Provider/Extender: Melburn Hake, Alois Colgan Weeks in Treatment: 0 Verbal / Phone Orders: No Diagnosis Coding ICD-10 Coding Code Description E11.621 Type 2 diabetes mellitus with foot ulcer L97.522 Non-pressure chronic ulcer of other part of left foot with fat layer exposed I10 Essential (primary) hypertension I25.10 Atherosclerotic heart disease of native coronary artery without angina pectoris Wound Cleansing Wound #1 Left Toe Great o Clean wound with Normal Saline. - in office o Dial antibacterial soap, wash wounds, rinse and pat dry prior to dressing wounds Primary Wound Dressing Wound #1 Left Toe Great o Silver Alginate Secondary Dressing Wound #1 Left Toe Great o Foam o Other - secure with tape Dressing Change Frequency Wound #1 Left Toe Great o Change dressing every other  day. Follow-up Appointments Wound #1 Left Toe Great o Return Appointment in 1 week. Off-Loading Wound #1 Left Toe Great o Open toe surgical shoe with peg assist. Electronic Signature(s) Signed: 10/11/2018 4:38:54 PM By: Army Melia Signed: 10/14/2018 6:59:38 PM By: Worthy Keeler PA-C Entered By: Army Melia on 10/11/2018 15:31:07 Martin Mullen, Martin Mullen ([id number]) -------------------------------------------------------------------------------- Problem List Details Patient Name: Martin Mullen Date of Service: 10/11/2018 2:15 PM Medical Record Number: [id number] Patient Account Number: [id number] Date of Birth/Sex: 05-22-1980 (38 y.o. M) Treating RN: Army Melia Primary Care Provider: Webb Silversmith Other Clinician: Referring Provider: Webb Silversmith Treating Provider/Extender: STONE III, Cottrell Gentles Weeks in Treatment: 0 Active Problems  ICD-10 Evaluated Encounter Code Description Active Date Today Diagnosis E11.621 Type 2 diabetes mellitus with foot ulcer 10/11/2018 No Yes L97.522 Non-pressure chronic ulcer of other part of left foot with fat 10/11/2018 No Yes layer exposed I10 Essential (primary) hypertension 10/11/2018 No Yes I25.10 Atherosclerotic heart disease of native coronary artery 10/11/2018 No Yes without angina pectoris Inactive Problems Resolved Problems Electronic Signature(s) Signed: 10/11/2018 3:05:21 PM By: Lenda Kelp PA-C Entered By: Lenda Kelp on 10/11/2018 15:05:21 Martin Mullen ([id number]) -------------------------------------------------------------------------------- Progress Note Details Patient Name: Martin Mullen Date of Service: 10/11/2018 2:15 PM Medical Record Number: [id number] Patient Account Number: [id number] Date of Birth/Sex: 20-Dec-1980 (38 y.o. M) Treating RN: Rodell Perna Primary Care Provider: [name] Other Clinician: Referring Provider: [name] Treating Provider/Extender: Linwood Dibbles, Estell Dillinger Weeks in Treatment:  0 Subjective Chief Complaint Information obtained from Patient left great toe ulcer History of Present Illness (HPI) 10/11/18 patient presents today for initial evaluation our clinic. He is being seen due to a left great toe ulcer which unfortunately has been present for quite some time. Initially he thought it really wasn't much of anything and then subsequently noted that he had some drainage then that seem to dry off and he didn't worry too much about it until more recently when he is had issues. 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 Subsequently he tells me he has not utilized any recreational drugs for two years. This is obviously excellent news. He also does have diabetes type II. It does sound like he is trying to improve things as far as his overall health status is concerned although his hemoglobin A-1 C as measured on 09/27/18 was 10.7. He did have an x-ray as well on 09/28/18 and this was negative for any evidence of acute or chronic osteomyelitis or fracture. Patient's wound is not appear to be severely infected at this time which is good news. With that being said he also doesn't really have any pain this is secondary to neuropathy. No fevers, chills, nausea, or vomiting noted at this time. Patient History Information obtained from Patient. Allergies No Known Drug Allergies Family History Diabetes - Mother,Father,Paternal Grandparents,Maternal Grandparents, No family history of Cancer, Heart Disease, Hereditary Spherocytosis, Hypertension, Kidney Disease, Lung Disease, Seizures, Stroke, Thyroid Problems, Tuberculosis. Social History Current every day smoker, Marital Status - Single, Alcohol Use - Rarely, Drug Use - Prior History, Caffeine Use - Daily. Medical History Cardiovascular Patient has history of Coronary Artery Disease, Hypertension, Myocardial  Infarction Denies history of Angina, Arrhythmia, Congestive Heart Failure, Deep Vein Thrombosis, Hypotension, Peripheral Arterial Disease, Peripheral Venous Disease, Phlebitis, Vasculitis Endocrine Patient has history of Type II Diabetes Denies history of Type I Diabetes Integumentary (Skin) Denies history of History of Burn, History of pressure wounds Patient is treated with Oral Agents. Medical And Surgical History Notes JONAVEN, HILGERS ([id number]) Cardiovascular HLD Review of Systems (ROS) Constitutional Symptoms (General Health) Denies complaints or symptoms of Fatigue, Fever, Chills, Marked Weight Change. Eyes Denies complaints or symptoms of Dry Eyes, Vision Changes, Glasses / Contacts. Ear/Nose/Mouth/Throat Denies complaints or symptoms of Difficult clearing ears, Sinusitis. Hematologic/Lymphatic Denies complaints or symptoms of Bleeding / Clotting Disorders, Human Immunodeficiency Virus. Respiratory Denies complaints or symptoms of Chronic or frequent coughs, Shortness of Breath. Cardiovascular Denies complaints or symptoms of Chest pain, LE edema. Gastrointestinal Denies complaints or symptoms of Frequent diarrhea, Nausea, Vomiting. Endocrine Denies complaints or symptoms  of Hepatitis, Thyroid disease, Polydypsia (Excessive Thirst). Genitourinary Denies complaints or symptoms of Kidney failure/ Dialysis, Incontinence/dribbling. Immunological Denies complaints or symptoms of Hives, Itching. Integumentary (Skin) Complains or has symptoms of Wounds. Denies complaints or symptoms of Bleeding or bruising tendency, Breakdown, Swelling. Musculoskeletal Denies complaints or symptoms of Muscle Pain, Muscle Weakness. Neurologic Denies complaints or symptoms of Numbness/parasthesias, Focal/Weakness. Psychiatric Denies complaints or symptoms of Anxiety, Claustrophobia. Objective Constitutional patient is hypertensive.. pulse regular and within target range for patient.Marland Kitchen.  respirations regular, non-labored and within target range for patient.Marland Kitchen. temperature within target range for patient.. Well-nourished and well-hydrated in no acute distress. Vitals Time Taken: 2:25 PM, Height: 70 in, Source: Stated, Weight: 230 lbs, Source: Measured, BMI: 33, Temperature: 98.3  F, Pulse: 77 bpm, Respiratory Rate: 16 breaths/min, Blood Pressure: 130/74 mmHg. Eyes conjunctiva clear no eyelid edema noted. pupils equal round and reactive to light and accommodation. Ears, Nose, Mouth, and Throat no gross abnormality of ear auricles or external auditory canals. normal hearing noted during conversation. mucus membranes moist. Martin Mullen, Martin Mullen ([id number]) Respiratory normal breathing without difficulty. clear to auscultation bilaterally. Cardiovascular regular rate and rhythm with normal S1, S2. 2+ dorsalis pedis/posterior tibialis pulses. no clubbing, cyanosis, significant edema, Gastrointestinal (GI) soft, non-tender, non-distended, +BS. no ventral hernia noted. Musculoskeletal normal gait and posture. no significant deformity or arthritic changes, no loss or range of motion, no clubbing. Psychiatric this patient is able to make decisions and demonstrates good insight into disease process. Alert and Oriented x 3. pleasant and cooperative. General Notes: Upon evaluation patient's wound bed actually is gonna require sharp debridement today to clear away sloughing necrotic material from the surface of the wound. He actually tolerated debridement at this point without complication and post debridement wound bed appears to be doing much better. In fact it appears to have a very healthy wound bed and there does not appear to be any evidence of infection nor structure exposure which is good news. Integumentary (Hair, Skin) Wound #1 status is Open. Original cause of wound was Gradually Appeared. The wound is located on the Left Toe Great. The wound measures 0.1cm length x 0.1cm width x  0.7cm depth; 0.008cm^2 area and 0.005cm^3 volume. There is Fat Layer (Subcutaneous Tissue) Exposed exposed. There is no tunneling or undermining noted. There is a none present amount of drainage noted. The wound margin is flat and intact. There is no granulation within the wound bed. There is no necrotic tissue within the wound bed. General Notes: unable to visualize wound bed Assessment Active Problems ICD-10 Type 2 diabetes mellitus with foot ulcer Non-pressure chronic ulcer of other part of left foot with fat layer exposed Essential (primary) hypertension Atherosclerotic heart disease of native coronary artery without angina pectoris Procedures Wound #1 Pre-procedure diagnosis of Wound #1 is a Diabetic Wound/Ulcer of the Lower Extremity located on the Left Toe Great .Severity of Tissue Pre Debridement is: Fat layer exposed. There was a Excisional Skin/Subcutaneous Tissue Debridement with a total area of 0.01 sq cm performed by STONE III, Jodey Burbano E., PA-C. With the following instrument(s): Curette to remove Viable and Non-Viable tissue/material. Material removed includes Callus and Subcutaneous Tissue and after achieving pain control using Lidocaine. A time out was conducted at 15:16, prior to the start of the procedure. A Minimum amount of bleeding was controlled with Pressure. The procedure was tolerated well. Post Debridement Measurements: 0.6cm length x 0.3cm width x 0.4cm depth; 0.057cm^3 volume. Martin JeffersonOLIVER, Martin Mullen ([id number]) Character of Wound/Ulcer Post Debridement is stable. Severity of Tissue  Post Debridement is: Fat layer exposed. Post procedure Diagnosis Wound #1: Same as Pre-Procedure Plan Wound Cleansing: Wound #1 Left Toe Great: Clean wound with Normal Saline. - in office Dial antibacterial soap, wash wounds, rinse and pat dry prior to dressing wounds Primary Wound Dressing: Wound #1 Left Toe Great: Silver Alginate Secondary Dressing: Wound #1 Left Toe Great: Foam Other -  secure with tape Dressing Change Frequency: Wound #1 Left Toe Great: Change dressing every other day. Follow-up Appointments: Wound #1 Left Toe Great: Return Appointment in 1 week. Off-Loading: Wound #1 Left Toe Great: Open toe surgical shoe with peg assist. 1. At this point I'm in a recommend that we go ahead and initiate a silver out and addressing I think this will help with the drainage that he is experiencing although I think that's gonna improve hopefully quite effectively between now and next week. 2. I'm also gonna suggest that we go ahead and initiate treatment with a peg assist offloading shoe in order to keep pressure off of the area and allow this to heal appropriately. 3. 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 Please see above for specific wound care orders. We will see patient for re-evaluation in 1 week(s) here in the clinic. If anything worsens or changes patient will contact our office for additional recommendations. Electronic Signature(s) Signed: 10/14/2018 6:59:38 PM By: Lenda Kelp PA-C Entered By: Lenda Kelp on 10/14/2018 18:58:26 Martin Mullen, Martin Mullen ([id number]) -------------------------------------------------------------------------------- ROS/PFSH Details Patient Name: Martin Mullen Date of Service: 10/11/2018 2:15 PM Medical Record Number: [id number] Patient Account Number: [id number] Date of Birth/Sex: [birth year] (38 y.o. M) Treating RN: Curtis Sites Primary Care Provider: [name] Other Clinician: Referring Provider: [name] Treating Provider/Extender: Linwood Dibbles, Klyn Kroening Weeks in Treatment: 0 Information Obtained From Patient Constitutional Symptoms (General Health) Complaints and Symptoms: Negative for: Fatigue; Fever; Chills; Marked Weight Change Eyes Complaints and Symptoms: Negative for: Dry Eyes; Vision Changes; Glasses /  Contacts Ear/Nose/Mouth/Throat Complaints and Symptoms: Negative for: Difficult clearing ears; Sinusitis Hematologic/Lymphatic Complaints and Symptoms: Negative for: Bleeding / Clotting Disorders; Human Immunodeficiency Virus Respiratory Complaints and Symptoms: Negative for: Chronic or frequent coughs; Shortness of Breath Cardiovascular Complaints and Symptoms: Negative for: Chest pain; LE edema Medical History: Positive for: Coronary Artery Disease; Hypertension; Myocardial Infarction Negative for: Angina; Arrhythmia; Congestive Heart Failure; Deep Vein Thrombosis; Hypotension; Peripheral Arterial Disease; Peripheral Venous Disease; Phlebitis; Vasculitis Past Medical History Notes: HLD Gastrointestinal Complaints and Symptoms: Negative for: Frequent diarrhea; Nausea; Vomiting Endocrine Complaints and Symptoms: Negative for: Hepatitis; Thyroid disease; Polydypsia (Excessive Thirst) Medical History: TAD, FANCHER ([id number]) Positive for: Type II Diabetes Negative for: Type I Diabetes Treated with: Oral agents Genitourinary Complaints and Symptoms: Negative for: Kidney failure/ Dialysis; Incontinence/dribbling Immunological Complaints and Symptoms: Negative for: Hives; Itching Integumentary (Skin) Complaints and Symptoms: Positive for: Wounds Negative for: Bleeding or bruising tendency; Breakdown; Swelling Medical History: Negative for: History of Burn; History of pressure wounds Musculoskeletal Complaints and Symptoms: Negative for: Muscle Pain; Muscle Weakness Neurologic Complaints and Symptoms: Negative for: Numbness/parasthesias; Focal/Weakness Psychiatric Complaints and Symptoms: Negative for: Anxiety; Claustrophobia Immunizations Pneumococcal Vaccine: Received Pneumococcal Vaccination: Yes Implantable Devices None Family and Social History Cancer: No; Diabetes: Yes - Mother,Father,Paternal Grandparents,Maternal Grandparents; Heart Disease: No;  Hereditary Spherocytosis: No; Hypertension: No; Kidney Disease: No; Lung Disease: No; Seizures: No; Stroke: No; Thyroid Problems: No; Tuberculosis: No; Current every day smoker; Marital Status - Single; Alcohol Use: Rarely; Drug Use: Prior History;  Caffeine Use: Daily; Financial Concerns: No; Food, Clothing or Shelter Needs: No; Support System Lacking: No; Transportation Concerns: No Electronic Signature(s) Signed: 10/11/2018 4:37:52 PM By: Curtis Sitesorthy, Joanna Signed: 10/14/2018 6:59:38 PM By: Lenda KelpStone III, Cullan Launer PA-C Entered By: Curtis Sitesorthy, Joanna on 10/11/2018 14:45:58 Martin JeffersonOLIVER, Martin Mullen ([id number]) Martin JeffersonOLIVER, Martin Mullen ([id number]) -------------------------------------------------------------------------------- SuperBill Details Patient Name: Martin Mullen, Martin Mullen Date of Service: 10/11/2018 Medical Record Number: [id number] Patient Account Number: [id number] Date of Birth/Sex: 10-Nov-1980 (38 y.o. M) Treating RN: Rodell PernaScott, Dajea Primary Care Provider: Nicki ReaperBAITY, REGINA Other Clinician: Referring Provider: Nicki ReaperBAITY, REGINA Treating Provider/Extender: Linwood DibblesSTONE III, Chakara Bognar Weeks in Treatment: 0 Diagnosis Coding ICD-10 Codes Code Description E11.621 Type 2 diabetes mellitus with foot ulcer L97.522 Non-pressure chronic ulcer of other part of left foot with fat layer exposed I10 Essential (primary) hypertension I25.10 Atherosclerotic heart disease of native coronary artery without angina pectoris Facility Procedures CPT4 Code: [id number] Description: 99213 - WOUND CARE VISIT-LEV 3 EST PT Modifier: Quantity: 1 CPT4 Code: [id number] Description: 11042 - DEB SUBQ TISSUE 20 SQ CM/< ICD-10 Diagnosis Description L97.522 Non-pressure chronic ulcer of other part of left foot with fat Modifier: layer exposed Quantity: 1 Physician Procedures CPT4 Code Description: [id number] 99204 - WC PHYS LEVEL 4 - NEW PT ICD-10 Diagnosis Description E11.621 Type 2 diabetes mellitus with foot ulcer L97.522 Non-pressure chronic ulcer of other part of left foot  with fat I10 Essential (primary) hypertension  I25.10 Atherosclerotic heart disease of native coronary artery without Modifier: 25 layer exposed angina pectoris Quantity: 1 CPT4 Code Description: [id number] 11042 - WC PHYS SUBQ TISS 20 SQ CM ICD-10 Diagnosis Description L97.522 Non-pressure chronic ulcer of other part of left foot with fat Modifier: layer exposed Quantity: 1 Electronic Signature(s) Signed: 10/14/2018 6:59:38 PM By: Lenda KelpStone III, Nayla Dias PA-C Entered By: Lenda KelpStone III, Timberlyn Pickford on 10/11/2018 22:36:32

## 2018-10-18 ENCOUNTER — Other Ambulatory Visit: Payer: Self-pay

## 2018-10-18 ENCOUNTER — Encounter: Payer: BC Managed Care – PPO | Attending: Physician Assistant | Admitting: Physician Assistant

## 2018-10-18 DIAGNOSIS — E11621 Type 2 diabetes mellitus with foot ulcer: Secondary | ICD-10-CM | POA: Insufficient documentation

## 2018-10-18 DIAGNOSIS — I252 Old myocardial infarction: Secondary | ICD-10-CM | POA: Insufficient documentation

## 2018-10-18 DIAGNOSIS — E1142 Type 2 diabetes mellitus with diabetic polyneuropathy: Secondary | ICD-10-CM | POA: Insufficient documentation

## 2018-10-18 DIAGNOSIS — I10 Essential (primary) hypertension: Secondary | ICD-10-CM | POA: Diagnosis not present

## 2018-10-18 DIAGNOSIS — E785 Hyperlipidemia, unspecified: Secondary | ICD-10-CM | POA: Insufficient documentation

## 2018-10-18 DIAGNOSIS — I2510 Atherosclerotic heart disease of native coronary artery without angina pectoris: Secondary | ICD-10-CM | POA: Insufficient documentation

## 2018-10-18 DIAGNOSIS — Z955 Presence of coronary angioplasty implant and graft: Secondary | ICD-10-CM | POA: Insufficient documentation

## 2018-10-18 DIAGNOSIS — L97522 Non-pressure chronic ulcer of other part of left foot with fat layer exposed: Secondary | ICD-10-CM | POA: Insufficient documentation

## 2018-10-19 NOTE — Progress Notes (Addendum)
Martin Mullen ([id number]) Visit Report for 10/18/2018 Chief Complaint Document Details Patient Name: Martin Mullen Date of Service: 10/18/2018 3:30 PM Medical Record Number: [id number] Patient Account Number: [id number] Date of Birth/Sex: 1981/01/16 (38 y.o. M) Treating RN: Rodell Perna Primary Care Provider: [name] Other Clinician: Referring Provider: [name] Treating Provider/Extender: Linwood Dibbles, Laquitta Dominski Weeks in Treatment: 1 Information Obtained from: Patient Chief Complaint left great toe ulcer Electronic Signature(s) Signed: 10/18/2018 3:37:01 PM By: Lenda Kelp PA-C Entered By: Lenda Kelp on 10/18/2018 15:37:00 Martin Mullen, Martin Mullen ([id number]) -------------------------------------------------------------------------------- Debridement Details Patient Name: Martin Mullen Date of Service: 10/18/2018 3:30 PM Medical Record Number: [id number] Patient Account Number: [id number] Date of Birth/Sex: Jul 08, 1980 (38 y.o. M) Treating RN: Rodell Perna Primary Care Provider: [name] Other Clinician: Referring Provider: [name] Treating Provider/Extender: Linwood Dibbles, Toshiyuki Fredell Weeks in Treatment: 1 Debridement Performed for Wound #1 Left Toe Great Assessment: Performed By: Physician STONE III, Zoey Bidwell E., PA-C Debridement Type: Debridement Severity of Tissue Pre Fat layer exposed Debridement: Level of Consciousness (Pre- Awake and Alert procedure): Pre-procedure Verification/Time Yes - 15:40 Out Taken: Start Time: 15:40 Total Area Debrided (L x W): 0.3 (cm) x 0.2 (cm) = 0.06 (cm) Tissue and other material Callus, Slough, Subcutaneous, Skin: Dermis , Slough debrided: Level: Skin/Subcutaneous Tissue Debridement Description: Excisional Instrument: Curette Bleeding: None End Time: 15:42 Response to Treatment: Procedure was tolerated well Level of Consciousness Awake and Alert (Post-procedure): Post Debridement Measurements of Total Wound Length: (cm) 0.6 Width:  (cm) 0.4 Depth: (cm) 0.4 Volume: (cm) 0.075 Character of Wound/Ulcer Post Debridement: Stable Severity of Tissue Post Debridement: Fat layer exposed Post Procedure Diagnosis Same as Pre-procedure Electronic Signature(s) Signed: 10/18/2018 4:01:33 PM By: Rodell Perna Signed: 10/19/2018 7:48:01 PM By: Lenda Kelp PA-C Entered By: Rodell Perna on 10/18/2018 15:45:28 Martin Mullen ([id number]) -------------------------------------------------------------------------------- HPI Details Patient Name: Martin Mullen Date of Service: 10/18/2018 3:30 PM Medical Record Number: [id number] Patient Account Number: [id number] Date of Birth/Sex: 09-30-1980 (38 y.o. M) Treating RN: Rodell Perna Primary Care Provider: [name] Other Clinician: Referring Provider: [name] Treating Provider/Extender: Linwood Dibbles, Anelly Samarin Weeks in Treatment: 1 History of Present Illness HPI Description: 10/11/18 patient presents today for initial evaluation our clinic. He is being seen due to a left great toe ulcer which unfortunately has been present for quite some time. Initially he thought it really wasn't much of anything and then subsequently noted that he had some drainage then that seem to dry off and he didn't worry too much about it until more recently when he is had issues. 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 Subsequently he tells me he has not utilized any recreational drugs for two years. This is obviously excellent news. He also does have diabetes type II. It does sound like he is trying to improve things as far as his overall health status is concerned although his hemoglobin A-1 C as measured on 09/27/18 was 10.7. He did have an x-ray as well on 09/28/18 and this was negative for any evidence of acute or chronic osteomyelitis or fracture. Patient's wound is not appear to be severely  infected at this time which is good news. With that being said he also doesn't really have any pain this is secondary to neuropathy. No fevers, chills, nausea, or vomiting noted at this time. 10/18/2018 on evaluation today patient actually appears to be doing better with regard to  his toe ulcer. He has been tolerating the dressing changes without complication. Fortunately there is no signs of active infection at this time. Overall very pleased in this regard and how things are doing. Patient likewise is pleased and happy to see that things are doing much better. Still he does have some ways to go to get this area to completely close. Electronic Signature(s) Signed: 10/18/2018 5:02:55 PM By: Lenda KelpStone III, Zarai Orsborn PA-C Entered By: Lenda KelpStone III, Rilley Stash on 10/18/2018 17:02:54 Martin Mullen, Martin Mullen ([id number]) -------------------------------------------------------------------------------- Physical Exam Details Patient Name: Martin Mullen, Martin Mullen Date of Service: 10/18/2018 3:30 PM Medical Record Number: [id number] Patient Account Number: [id number] Date of Birth/Sex: March 05, 1980 ([age] y.o. M) Treating RN: Rodell PernaScott, Dajea Primary Care Provider: Nicki ReaperBAITY, REGINA Other Clinician: Referring Provider: Nicki ReaperBAITY, REGINA Treating Provider/Extender: STONE III, Gawain Crombie Weeks in Treatment: 1 Constitutional Well-nourished and well-hydrated in no acute distress. Respiratory normal breathing without difficulty. Psychiatric this patient is able to make decisions and demonstrates good insight into disease process. Alert and Oriented x 3. pleasant and cooperative. Notes Patient's wound bed currently showed signs of some callus buildup around the edge of the wound which did require sharp debridement today. I was able to perform sharp debridement without complication and post debridement wound bed appears to be doing much better he does have neuropathy. Electronic Signature(s) Signed: 10/18/2018 5:03:20 PM By: Lenda KelpStone III, Tamel Abel PA-C Entered By: Lenda KelpStone III,  Candelaria Pies on 10/18/2018 17:03:20 Martin Mullen, Caliber ([id number]) -------------------------------------------------------------------------------- Physician Orders Details Patient Name: Martin Mullen, Martin Mullen Date of Service: 10/18/2018 3:30 PM Medical Record Number: [id number] Patient Account Number: [id number] Date of Birth/Sex: March 05, 1980 (38 y.o. M) Treating RN: Rodell PernaScott, Dajea Primary Care Provider: Nicki ReaperBAITY, REGINA Other Clinician: Referring Provider: Nicki ReaperBAITY, REGINA Treating Provider/Extender: Linwood DibblesSTONE III, Verlene Glantz Weeks in Treatment: 1 Verbal / Phone Orders: No Diagnosis Coding ICD-10 Coding Code Description E11.621 Type 2 diabetes mellitus with foot ulcer L97.522 Non-pressure chronic ulcer of other part of left foot with fat layer exposed I10 Essential (primary) hypertension I25.10 Atherosclerotic heart disease of native coronary artery without angina pectoris Wound Cleansing Wound #1 Left Toe Great o Clean wound with Normal Saline. - in office o Dial antibacterial soap, wash wounds, rinse and pat dry prior to dressing wounds Primary Wound Dressing Wound #1 Left Toe Great o Silver Collagen Secondary Dressing Wound #1 Left Toe Great o Foam o Other - secure with tape Dressing Change Frequency Wound #1 Left Toe Great o Change dressing every other day. Follow-up Appointments Wound #1 Left Toe Great o Return Appointment in 1 week. Off-Loading Wound #1 Left Toe Great o Open toe surgical shoe with peg assist. Electronic Signature(s) Signed: 10/18/2018 4:01:33 PM By: Rodell PernaScott, Dajea Signed: 10/19/2018 7:48:01 PM By: Lenda KelpStone III, Shawnee Gambone PA-C Entered By: Rodell PernaScott, Dajea on 10/18/2018 15:46:43 Martin Mullen, Shamir ([id number]) -------------------------------------------------------------------------------- Problem List Details Patient Name: Martin Mullen, Martin Mullen Date of Service: 10/18/2018 3:30 PM Medical Record Number: [id number] Patient Account Number: [id number] Date of Birth/Sex: March 05, 1980 (38 y.o. M) Treating RN:  Rodell PernaScott, Dajea Primary Care Provider: Nicki ReaperBAITY, REGINA Other Clinician: Referring Provider: Nicki ReaperBAITY, REGINA Treating Provider/Extender: Linwood DibblesSTONE III, Arilla Hice Weeks in Treatment: 1 Active Problems ICD-10 Evaluated Encounter Code Description Active Date Today Diagnosis E11.621 Type 2 diabetes mellitus with foot ulcer 10/11/2018 No Yes L97.522 Non-pressure chronic ulcer of other part of left foot with fat 10/11/2018 No Yes layer exposed I10 Essential (primary) hypertension 10/11/2018 No Yes I25.10 Atherosclerotic heart disease of native coronary artery 10/11/2018 No Yes without angina pectoris Inactive Problems Resolved Problems Electronic Signature(s) Signed: 10/18/2018 3:36:53 PM By: Lenda KelpStone III, Izreal Kock PA-C Entered  By: Lenda Kelp on 10/18/2018 15:36:53 Martin Mullen ([id number]) -------------------------------------------------------------------------------- Progress Note Details Patient Name: Martin Mullen, Martin Mullen Date of Service: 10/18/2018 3:30 PM Medical Record Number: [id number] Patient Account Number: [id number] Date of Birth/Sex: [birth year] (38 y.o. M) Treating RN: Rodell Perna Primary Care Provider: [name] Other Clinician: Referring Provider: [name] Treating Provider/Extender: Linwood Dibbles, Orlandis Sanden Weeks in Treatment: 1 Subjective Chief Complaint Information obtained from Patient left great toe ulcer History of Present Illness (HPI) 10/11/18 patient presents today for initial evaluation our clinic. He is being seen due to a left great toe ulcer which unfortunately has been present for quite some time. Initially he thought it really wasn't much of anything and then subsequently noted that he had some drainage then that seem to dry off and he didn't worry too much about it until more recently when he is had issues. 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 Subsequently he tells  me he has not utilized any recreational drugs for two years. This is obviously excellent news. He also does have diabetes type II. It does sound like he is trying to improve things as far as his overall health status is concerned although his hemoglobin A-1 C as measured on 09/27/18 was 10.7. He did have an x-ray as well on 09/28/18 and this was negative for any evidence of acute or chronic osteomyelitis or fracture. Patient's wound is not appear to be severely infected at this time which is good news. With that being said he also doesn't really have any pain this is secondary to neuropathy. No fevers, chills, nausea, or vomiting noted at this time. 10/18/2018 on evaluation today patient actually appears to be doing better with regard to his toe ulcer. He has been tolerating the dressing changes without complication. Fortunately there is no signs of active infection at this time. Overall very pleased in this regard and how things are doing. Patient likewise is pleased and happy to see that things are doing much better. Still he does have some ways to go to get this area to completely close. Patient History Information obtained from Patient. Family History Diabetes - Mother,Father,Paternal Grandparents,Maternal Grandparents, No family history of Cancer, Heart Disease, Hereditary Spherocytosis, Hypertension, Kidney Disease, Lung Disease, Seizures, Stroke, Thyroid Problems, Tuberculosis. Social History Current every day smoker, Marital Status - Single, Alcohol Use - Rarely, Drug Use - Prior History, Caffeine Use - Daily. Medical History Cardiovascular Patient has history of Coronary Artery Disease, Hypertension, Myocardial Infarction Denies history of Angina, Arrhythmia, Congestive Heart Failure, Deep Vein Thrombosis, Hypotension, Peripheral Arterial Disease, Peripheral Venous Disease, Phlebitis, Vasculitis Endocrine Patient has history of Type II Diabetes Denies history of Type I  Diabetes Integumentary (Skin) Denies history of History of Burn, History of pressure wounds Medical And Surgical History Notes Cardiovascular KRUZE, ATCHLEY ([id number]) HLD Review of Systems (ROS) Constitutional Symptoms (General Health) Denies complaints or symptoms of Fatigue, Fever, Chills, Marked Weight Change. Respiratory Denies complaints or symptoms of Chronic or frequent coughs, Shortness of Breath. Cardiovascular Denies complaints or symptoms of Chest pain, LE edema. Psychiatric Denies complaints or symptoms of Anxiety, Claustrophobia. Objective Constitutional Well-nourished and well-hydrated in no acute distress. Vitals Time Taken: 3:28 PM, Height: 70 in, Weight: 230 lbs, BMI: 33, Temperature: 98.2 F, Pulse: 79 bpm, Respiratory Rate: 16 breaths/min, Blood Pressure: 128/84 mmHg. Respiratory normal breathing without difficulty. Psychiatric this patient is able to make decisions and demonstrates  good insight into disease process. Alert and Oriented x 3. pleasant and cooperative. General Notes: Patient's wound bed currently showed signs of some callus buildup around the edge of the wound which did require sharp debridement today. I was able to perform sharp debridement without complication and post debridement wound bed appears to be doing much better he does have neuropathy. Integumentary (Hair, Skin) Wound #1 status is Open. Original cause of wound was Gradually Appeared. The wound is located on the Left Toe Great. The wound measures 0.3cm length x 0.2cm width x 0.5cm depth; 0.047cm^2 area and 0.024cm^3 volume. There is Fat Layer (Subcutaneous Tissue) Exposed exposed. There is no tunneling or undermining noted. There is a medium amount of serous drainage noted. The wound margin is flat and intact. There is large (67-100%) red granulation within the wound bed. There is no necrotic tissue within the wound bed. Assessment Active Problems ICD-10 Type 2 diabetes mellitus  with foot ulcer Non-pressure chronic ulcer of other part of left foot with fat layer exposed Martin Mullen, Martin Mullen ([id number]) Essential (primary) hypertension Atherosclerotic heart disease of native coronary artery without angina pectoris Procedures Wound #1 Pre-procedure diagnosis of Wound #1 is a Diabetic Wound/Ulcer of the Lower Extremity located on the Left Toe Great .Severity of Tissue Pre Debridement is: Fat layer exposed. There was a Excisional Skin/Subcutaneous Tissue Debridement with a total area of 0.06 sq cm performed by STONE III, Phi Avans E., PA-C. With the following instrument(s): Curette Material removed includes Callus, Subcutaneous Tissue, Slough, and Skin: Dermis. A time out was conducted at 15:40, prior to the start of the procedure. There was no bleeding. The procedure was tolerated well. Post Debridement Measurements: 0.6cm length x 0.4cm width x 0.4cm depth; 0.075cm^3 volume. Character of Wound/Ulcer Post Debridement is stable. Severity of Tissue Post Debridement is: Fat layer exposed. Post procedure Diagnosis Wound #1: Same as Pre-Procedure Plan Wound Cleansing: Wound #1 Left Toe Great: Clean wound with Normal Saline. - in office Dial antibacterial soap, wash wounds, rinse and pat dry prior to dressing wounds Primary Wound Dressing: Wound #1 Left Toe Great: Silver Collagen Secondary Dressing: Wound #1 Left Toe Great: Foam Other - secure with tape Dressing Change Frequency: Wound #1 Left Toe Great: Change dressing every other day. Follow-up Appointments: Wound #1 Left Toe Great: Return Appointment in 1 week. Off-Loading: Wound #1 Left Toe Great: Open toe surgical shoe with peg assist. 1. I would recommend that we actually switch to a silver collagen dressing as of today the wound bed seems to be doing excellent and I think that the collagen will help this to heal more effectively and quickly. 2. I recommend still continued offloading he will still continue to use the  PEG assist offloading shoe. We will see patient back for reevaluation in 1 week here in the clinic. If anything worsens or changes patient will contact our office for additional recommendations. Martin Mullen, Martin Mullen ([id number]) Electronic Signature(s) Signed: 10/18/2018 5:03:39 PM By: Lenda KelpStone III, Rashi Giuliani PA-C Entered By: Lenda KelpStone III, Damaria Stofko on 10/18/2018 17:03:39 Martin Mullen, Adan ([id number]) -------------------------------------------------------------------------------- ROS/PFSH Details Patient Name: Martin Mullen, Martin Mullen Date of Service: 10/18/2018 3:30 PM Medical Record Number: [id number] Patient Account Number: [id number] Date of Birth/Sex: [birth year] (38 y.o. M) Treating RN: Rodell PernaScott, Dajea Primary Care Provider: Nicki ReaperBAITY, REGINA Other Clinician: Referring Provider: Nicki ReaperBAITY, REGINA Treating Provider/Extender: Linwood DibblesSTONE III, Merryn Thaker Weeks in Treatment: 1 Information Obtained From Patient Constitutional Symptoms (General Health) Complaints and Symptoms: Negative for: Fatigue; Fever; Chills; Marked Weight Change Respiratory Complaints and Symptoms: Negative for: Chronic or frequent coughs;  Shortness of Breath Cardiovascular Complaints and Symptoms: Negative for: Chest pain; LE edema Medical History: Positive for: Coronary Artery Disease; Hypertension; Myocardial Infarction Negative for: Angina; Arrhythmia; Congestive Heart Failure; Deep Vein Thrombosis; Hypotension; Peripheral Arterial Disease; Peripheral Venous Disease; Phlebitis; Vasculitis Past Medical History Notes: HLD Psychiatric Complaints and Symptoms: Negative for: Anxiety; Claustrophobia Endocrine Medical History: Positive for: Type II Diabetes Negative for: Type I Diabetes Treated with: Oral agents Integumentary (Skin) Medical History: Negative for: History of Burn; History of pressure wounds Immunizations Pneumococcal Vaccine: Received Pneumococcal Vaccination: Yes Implantable Devices None Martin Mullen, Martin Mullen ([id number]) Family and Social  History Cancer: No; Diabetes: Yes - Mother,Father,Paternal Grandparents,Maternal Grandparents; Heart Disease: No; Hereditary Spherocytosis: No; Hypertension: No; Kidney Disease: No; Lung Disease: No; Seizures: No; Stroke: No; Thyroid Problems: No; Tuberculosis: No; Current every day smoker; Marital Status - Single; Alcohol Use: Rarely; Drug Use: Prior History; Caffeine Use: Daily; Financial Concerns: No; Food, Clothing or Shelter Needs: No; Support System Lacking: No; Transportation Concerns: No Physician Affirmation I have reviewed and agree with the above information. Electronic Signature(s) Signed: 10/19/2018 11:25:26 AM By: Army Melia Signed: 10/19/2018 7:48:01 PM By: Worthy Keeler PA-C Entered By: Worthy Keeler on 10/18/2018 17:03:07 Martin Mullen, Martin Mullen ([id number]) -------------------------------------------------------------------------------- SuperBill Details Patient Name: [name] Date of Service: 10/18/2018 Medical Record Number: [id number] Patient Account Number: [id number] Date of Birth/Sex: Mar 19, 1980 (38 y.o. M) Treating RN: Army Melia Primary Care Provider: Webb Silversmith Other Clinician: Referring Provider: Webb Silversmith Treating Provider/Extender: Melburn Hake, Sharne Linders Weeks in Treatment: 1 Diagnosis Coding ICD-10 Codes Code Description E11.621 Type 2 diabetes mellitus with foot ulcer L97.522 Non-pressure chronic ulcer of other part of left foot with fat layer exposed I10 Essential (primary) hypertension I25.10 Atherosclerotic heart disease of native coronary artery without angina pectoris Facility Procedures CPT4 Code: [id number] Description: 56433 - DEB SUBQ TISSUE 20 SQ CM/< ICD-10 Diagnosis Description L97.522 Non-pressure chronic ulcer of other part of left foot with fat Modifier: layer exposed Quantity: 1 Physician Procedures CPT4 Code: [id number] Description: 11042 - WC PHYS SUBQ TISS 20 SQ CM ICD-10 Diagnosis Description L97.522 Non-pressure chronic ulcer of other  part of left foot with fat Modifier: layer exposed Quantity: 1 Electronic Signature(s) Signed: 10/18/2018 5:03:53 PM By: Worthy Keeler PA-C Entered By: Worthy Keeler on 10/18/2018 17:03:53

## 2018-10-19 NOTE — Progress Notes (Signed)
Martin Mullen, Martin Mullen ([id number]) Visit Report for 10/18/2018 Arrival Information Details Patient Name: Martin Mullen, Martin Mullen Date of Service: 10/18/2018 3:30 PM Medical Record Number: [id number] Patient Account Number: [id number] Date of Birth/Sex: 1980/03/12 (38 y.o. M) Treating RN: Rodell PernaScott, Dajea Primary Care Brodi Kari: Nicki ReaperBAITY, REGINA Other Clinician: Referring Tobias Avitabile: Nicki ReaperBAITY, REGINA Treating Hulen Mandler/Extender: Linwood DibblesSTONE III, HOYT Weeks in Treatment: 1 Visit Information History Since Last Visit Added or deleted any medications: No Patient Arrived: Ambulatory Any new allergies or adverse reactions: No Arrival Time: 15:25 Had a fall or experienced change in No Accompanied By: self activities of Martin Mullen living that may affect Transfer Assistance: None risk of falls: Patient Identification Verified: Yes Signs or symptoms of abuse/neglect since last visito No Secondary Verification Process Completed: Yes Hospitalized since last visit: No Implantable device outside of the clinic excluding No cellular tissue based products placed in the center since last visit: Has Dressing in Place as Prescribed: No Pain Present Now: No Electronic Signature(s) Signed: 10/18/2018 4:10:04 PM By: Dayton MartesWallace, RCP,RRT,CHT, Sallie RCP, RRT, CHT Entered By: Dayton MartesWallace, RCP,RRT,CHT, Sallie on 10/18/2018 15:27:51 Martin Mullen, [name] -------------------------------------------------------------------------------- Encounter Discharge Information Details Patient Name: Martin Mullen, Martin Mullen Date of Service: 10/18/2018 3:30 PM Medical Record Number: [id number] Patient Account Number: [id number] Date of Birth/Sex: 1980/03/12 (38 y.o. M) Treating RN: Rodell PernaScott, Dajea Primary Care Cali Hope: Nicki ReaperBAITY, REGINA Other Clinician: Referring Zosia Lucchese: Nicki ReaperBAITY, REGINA Treating Raksha Wolfgang/Extender: Linwood DibblesSTONE III, HOYT Weeks in Treatment: 1 Encounter Discharge Information Items Post Procedure Vitals Discharge Condition: Stable Temperature (F): 98.2 Ambulatory Status:  Ambulatory Pulse (bpm): 79 Discharge Destination: Home Respiratory Rate (breaths/min): 16 Transportation: Private Auto Blood Pressure (mmHg): 128/84 Accompanied By: self Schedule Follow-up Appointment: Yes Clinical Summary of Care: Electronic Signature(s) Signed: 10/18/2018 4:01:33 PM By: Rodell PernaScott, Dajea Entered By: Rodell PernaScott, Dajea on 10/18/2018 15:47:50 Martin Mullen, Martin Mullen ([id number]) -------------------------------------------------------------------------------- Lower Extremity Assessment Details Patient Name: Martin Mullen, Martin Mullen Date of Service: 10/18/2018 3:30 PM Medical Record Number: [id number] Patient Account Number: [id number] Date of Birth/Sex: 1980/03/12 (38 y.o. M) Treating RN: Curtis Sitesorthy, Joanna Primary Care Keshawn Fiorito: Nicki ReaperBAITY, REGINA Other Clinician: Referring Demarko Zeimet: Nicki ReaperBAITY, REGINA Treating Bowie Doiron/Extender: STONE III, HOYT Weeks in Treatment: 1 Edema Assessment Assessed: [Left: No] [Right: No] Edema: [Left: N] [Right: o] Vascular Assessment Pulses: Dorsalis Pedis Palpable: [Left:Yes] Electronic Signature(s) Signed: 10/18/2018 4:26:35 PM By: Curtis Sitesorthy, Joanna Entered By: Curtis Sitesorthy, Joanna on 10/18/2018 15:34:21 Martin Mullen, Martin Mullen ([id number]) -------------------------------------------------------------------------------- Multi Wound Chart Details Patient Name: Martin Mullen, Martin Mullen Date of Service: 10/18/2018 3:30 PM Medical Record Number: [id number] Patient Account Number: [id number] Date of Birth/Sex: 1980/03/12 (38 y.o. M) Treating RN: Rodell PernaScott, Dajea Primary Care Caelen Higinbotham: Nicki ReaperBAITY, REGINA Other Clinician: Referring Shazia Mitchener: Nicki ReaperBAITY, REGINA Treating Cutler Sunday/Extender: STONE III, HOYT Weeks in Treatment: 1 Vital Signs Height(in): 70 Pulse(bpm): 79 Weight(lbs): 230 Blood Pressure(mmHg): 128/84 Body Mass Index(BMI): 33 Temperature(F): 98.2 Respiratory Rate 16 (breaths/min): Photos: [N/A:N/A] Wound Location: Left Toe Great N/A N/A Wounding Event: Gradually Appeared N/A N/A Primary Etiology:  Diabetic Wound/Ulcer of the N/A N/A Lower Extremity Comorbid History: Coronary Artery Disease, N/A N/A Hypertension, Myocardial Infarction, Type II Diabetes Date Acquired: 08/30/2018 N/A N/A Weeks of Treatment: 1 N/A N/A Wound Status: Open N/A N/A Pending Amputation on Yes N/A N/A Presentation: Measurements L x W x D 0.3x0.2x0.5 N/A N/A (cm) Area (cm) : 0.047 N/A N/A Volume (cm) : 0.024 N/A N/A % Reduction in Area: -487.50% N/A N/A % Reduction in Volume: -380.00% N/A N/A Classification: Grade 1 N/A N/A Exudate Amount: Medium N/A N/A Exudate Type: Serous N/A N/A Exudate Color: amber N/A N/A Wound Margin: Flat and Intact N/A N/A Granulation Amount: Large (67-100%) N/A  N/A Granulation Quality: Red N/A N/A Necrotic Amount: None Present (0%) N/A N/A Exposed Structures: Fat Layer (Subcutaneous N/A N/A Tissue) Exposed: Yes Fascia: No Martin Mullen, Martin Mullen ([id number]) Tendon: No Muscle: No Joint: No Bone: No Epithelialization: None N/A N/A Treatment Notes Electronic Signature(s) Signed: 10/18/2018 4:01:33 PM By: Rodell Perna Entered By: Rodell Perna on 10/18/2018 15:39:52 Martin Mullen ([id number]) -------------------------------------------------------------------------------- Multi-Disciplinary Care Plan Details Patient Name: Martin Mullen Date of Service: 10/18/2018 3:30 PM Medical Record Number: [id number] Patient Account Number: [id number] Date of Birth/Sex: 09-19-1980 (38 y.o. M) Treating RN: Rodell Perna Primary Care Wakisha Alberts: [name] Other Clinician: Referring Aasiya Creasey: [name] Treating Ansel Ferrall/Extender: Linwood Dibbles, HOYT Weeks in Treatment: 1 Active Inactive Nutrition Nursing Diagnoses: Potential for alteratiion in Nutrition/Potential for imbalanced nutrition Goals: Patient/caregiver verbalizes understanding of need to maintain therapeutic glucose control per primary care physician Date Initiated: 10/11/2018 Target Resolution Date: 10/25/2018 Goal Status:  Active Interventions: Assess HgA1c results as ordered upon admission and as needed Notes: Orientation to the Wound Care Program Nursing Diagnoses: Knowledge deficit related to the wound healing center program Goals: Patient/caregiver will verbalize understanding of the Wound Healing Center Program Date Initiated: 10/11/2018 Target Resolution Date: 10/19/2018 Goal Status: Active Interventions: Provide education on orientation to the wound center Notes: Wound/Skin Impairment Nursing Diagnoses: Impaired tissue integrity Goals: Ulcer/skin breakdown will have a volume reduction of 30% by week 4 Date Initiated: 10/11/2018 Target Resolution Date: 10/19/2018 Goal Status: Active Interventions: Assess ulceration(s) every visit DAYVON, LARUSSA ([id number]) Notes: Electronic Signature(s) Signed: 10/18/2018 4:01:33 PM By: Rodell Perna Entered By: Rodell Perna on 10/18/2018 15:39:44 Martin Mullen ([id number]) -------------------------------------------------------------------------------- Pain Assessment Details Patient Name: Martin Mullen Date of Service: 10/18/2018 3:30 PM Medical Record Number: [id number] Patient Account Number: [id number] Date of Birth/Sex: 1981-02-12 (38 y.o. M) Treating RN: Rodell Perna Primary Care Davione Lenker: [name] Other Clinician: Referring Candas Deemer: [name] Treating Velma Hanna/Extender: Linwood Dibbles, HOYT Weeks in Treatment: 1 Active Problems Location of Pain Severity and Description of Pain Patient Has Paino No Site Locations Pain Management and Medication Current Pain Management: Electronic Signature(s) Signed: 10/18/2018 4:01:33 PM By: Rodell Perna Signed: 10/18/2018 4:10:04 PM By: Dayton Martes RCP, RRT, CHT Entered By: Dayton Martes on 10/18/2018 15:27:58 Martin Mullen ([id number]) -------------------------------------------------------------------------------- Patient/Caregiver Education Details Patient Name: Martin Mullen Date of Service: 10/18/2018 3:30 PM Medical Record Number: [id number] Patient Account Number: [id number] Date of Birth/Gender: September 08, 1980 (38 y.o. M) Treating RN: Rodell Perna Primary Care Physician: [name] Other Clinician: Referring Physician: [name] Treating Physician/Extender: Skeet Simmer in Treatment: 1 Education Assessment Education Provided To: Patient Education Topics Provided Wound/Skin Impairment: Handouts: Caring for Your Ulcer Methods: Demonstration, Explain/Verbal Responses: State content correctly Electronic Signature(s) Signed: 10/18/2018 4:01:33 PM By: Rodell Perna Entered By: Rodell Perna on 10/18/2018 15:47:01 Martin Mullen ([id number]) -------------------------------------------------------------------------------- Wound Assessment Details Patient Name: Martin Mullen Date of Service: 10/18/2018 3:30 PM Medical Record Number: [id number] Patient Account Number: [id number] Date of Birth/Sex: [birth year] (38 y.o. M) Treating RN: Curtis Sites Primary Care Dawnita Molner: [name] Other Clinician: Referring Odies Desa: [name] Treating Zannie Runkle/Extender: STONE III, HOYT Weeks in Treatment: 1 Wound Status Wound Number: 1 Primary Diabetic Wound/Ulcer of the Lower Extremity Etiology: Wound Location: Left Toe Great Wound Open Wounding Event: Gradually Appeared Status: Date Acquired: 08/30/2018 Comorbid Coronary Artery Disease, Hypertension, Weeks Of Treatment: 1 History: Myocardial Infarction, Type II Diabetes Clustered Wound: No Pending Amputation On Presentation Photos Wound Measurements Length: (cm) 0.3 % Reduction in Width: (cm) 0.2 % Reduction in Depth: (cm) 0.5 Epithelializati Area: (cm) 0.047  Tunneling: Volume: (cm) 0.024 Undermining: Area: -487.5% Volume: -380% on: None No No Wound Description Classification: Grade 1 Foul Odor After Wound Margin: Flat and Intact Slough/Fibrino Exudate Amount: Medium Exudate  Type: Serous Exudate Color: amber Cleansing: No No Wound Bed Granulation Amount: Large (67-100%) Exposed Structure Granulation Quality: Red Fascia Exposed: No Necrotic Amount: None Present (0%) Fat Layer (Subcutaneous Tissue) Exposed: Yes Tendon Exposed: No Muscle Exposed: No Joint Exposed: No Bone Exposed: No Treatment Notes ROURKE, MCQUITTY ([id number]) Wound #1 (Left Toe Great) Notes Silver collagen, foam, medipore tape, peg assist shoe Left Electronic Signature(s) Signed: 10/18/2018 4:26:35 PM By: Montey Hora Entered By: Montey Hora on 10/18/2018 15:34:03 [name] ([id number]) -------------------------------------------------------------------------------- Vitals Details Patient Name: [name] Date of Service: 10/18/2018 3:30 PM Medical Record Number: [id number] Patient Account Number: [id number] Date of Birth/Sex: 01-02-1981 (38 y.o. M) Treating RN: Army Melia Primary Care Avry Roedl: Webb Silversmith Other Clinician: Referring Chase Arnall: Webb Silversmith Treating Sila Sarsfield/Extender: STONE III, HOYT Weeks in Treatment: 1 Vital Signs Time Taken: 15:28 Temperature (F): 98.2 Height (in): 70 Pulse (bpm): 79 Weight (lbs): 230 Respiratory Rate (breaths/min): 16 Body Mass Index (BMI): 33 Blood Pressure (mmHg): 128/84 Reference Range: 80 - 120 mg / dl Electronic Signature(s) Signed: 10/18/2018 4:10:04 PM By: Lorine Bears RCP, RRT, CHT Entered By: Lorine Bears on 10/18/2018 15:30:54

## 2018-10-26 ENCOUNTER — Ambulatory Visit: Payer: BC Managed Care – PPO | Admitting: Physician Assistant

## 2018-10-30 ENCOUNTER — Other Ambulatory Visit: Payer: Self-pay

## 2018-10-30 ENCOUNTER — Encounter: Payer: BC Managed Care – PPO | Admitting: Physician Assistant

## 2018-10-30 DIAGNOSIS — I252 Old myocardial infarction: Secondary | ICD-10-CM | POA: Diagnosis not present

## 2018-10-30 DIAGNOSIS — E1142 Type 2 diabetes mellitus with diabetic polyneuropathy: Secondary | ICD-10-CM | POA: Diagnosis not present

## 2018-10-30 DIAGNOSIS — Z955 Presence of coronary angioplasty implant and graft: Secondary | ICD-10-CM | POA: Diagnosis not present

## 2018-10-30 DIAGNOSIS — I10 Essential (primary) hypertension: Secondary | ICD-10-CM | POA: Diagnosis not present

## 2018-10-30 DIAGNOSIS — E11621 Type 2 diabetes mellitus with foot ulcer: Secondary | ICD-10-CM | POA: Diagnosis not present

## 2018-10-30 DIAGNOSIS — E785 Hyperlipidemia, unspecified: Secondary | ICD-10-CM | POA: Diagnosis not present

## 2018-10-30 DIAGNOSIS — L97522 Non-pressure chronic ulcer of other part of left foot with fat layer exposed: Secondary | ICD-10-CM | POA: Diagnosis not present

## 2018-10-30 DIAGNOSIS — I2510 Atherosclerotic heart disease of native coronary artery without angina pectoris: Secondary | ICD-10-CM | POA: Diagnosis not present

## 2018-10-30 NOTE — Progress Notes (Addendum)
Martin Mullen, Martin Mullen ([id number]) Visit Report for 10/30/2018 Chief Complaint Document Details Patient Name: Martin Mullen, Martin Mullen Date of Service: 10/30/2018 1:45 PM Medical Record Number: [id number] Patient Account Number: [id number] Date of Birth/Sex: March 30, 1980 (38 y.o. M) Treating RN: Curtis Sites Primary Care Provider: [name] Other Clinician: Referring Provider: [name] Treating Provider/Extender: Linwood Dibbles, Mechille Varghese Weeks in Treatment: 2 Information Obtained from: Patient Chief Complaint left great toe ulcer Electronic Signature(s) Signed: 10/30/2018 1:55:32 PM By: Lenda Kelp PA-C Entered By: Lenda Kelp on 10/30/2018 13:55:32 [name], [name] ([id number]) -------------------------------------------------------------------------------- Debridement Details Patient Name: Martin Mullen Date of Service: 10/30/2018 1:45 PM Medical Record Number: [id number] Patient Account Number: [id number] Date of Birth/Sex: [birth year] (38 y.o. M) Treating RN: Curtis Sites Primary Care Provider: [name] Other Clinician: Referring Provider: [name] Treating Provider/Extender: Linwood Dibbles, Edis Huish Weeks in Treatment: 2 Debridement Performed for Wound #1 Left Toe Great Assessment: Performed By: Physician STONE III, Vanisha Whiten E., PA-C Debridement Type: Debridement Severity of Tissue Pre Fat layer exposed Debridement: Level of Consciousness (Pre- Awake and Alert procedure): Pre-procedure Verification/Time Yes - 14:42 Out Taken: Start Time: 14:42 Pain Control: Lidocaine 4% Topical Solution Total Area Debrided (L x W): 0.2 (cm) x 0.2 (cm) = 0.04 (cm) Tissue and other material Viable, Non-Viable, Callus, Slough, Subcutaneous, Slough debrided: Level: Skin/Subcutaneous Tissue Debridement Description: Excisional Instrument: Curette Bleeding: Minimum Hemostasis Achieved: Pressure End Time: 14:46 Procedural Pain: 0 Post Procedural Pain: 0 Response to Treatment: Procedure was tolerated  well Level of Consciousness Awake and Alert (Post-procedure): Post Debridement Measurements of Total Wound Length: (cm) 0.3 Width: (cm) 0.3 Depth: (cm) 0.4 Volume: (cm) 0.028 Character of Wound/Ulcer Post Debridement: Improved Severity of Tissue Post Debridement: Fat layer exposed Post Procedure Diagnosis Same as Pre-procedure Electronic Signature(s) Signed: 10/30/2018 3:57:57 PM By: Curtis Sites Signed: 11/01/2018 2:05:35 AM By: Lenda Kelp PA-C Entered By: Curtis Sites on 10/30/2018 14:49:01 Martin Mullen ([id number]) -------------------------------------------------------------------------------- HPI Details Patient Name: Martin Mullen Date of Service: 10/30/2018 1:45 PM Medical Record Number: [id number] Patient Account Number: [id number] Date of Birth/Sex: [birth year] (38 y.o. M) Treating RN: Curtis Sites Primary Care Provider: [name] Other Clinician: Referring Provider: [name] Treating Provider/Extender: Linwood Dibbles, Luiscarlos Kaczmarczyk Weeks in Treatment: 2 History of Present Illness HPI Description: 10/11/18 patient presents today for initial evaluation our clinic. He is being seen due to a left great toe ulcer which unfortunately has been present for quite some time. Initially he thought it really wasn't much of anything and then subsequently noted that he had some drainage then that seem to dry off and he didn't worry too much about it until more recently when he is had issues. 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 Subsequently he tells me he has not utilized any recreational drugs for two years. This is obviously excellent news. He also does have diabetes type II. It does sound like he is trying to improve things as far as his overall health status is concerned although his hemoglobin A-1 C as measured on 09/27/18 was 10.7. He did have an x-ray as well  on 09/28/18 and this was negative for any evidence of acute or chronic osteomyelitis or fracture. Patient's wound is not appear to be severely infected at this time which is good news. With that being said he also doesn't really have any pain this is secondary to neuropathy. No fevers, chills, nausea, or vomiting noted at this  time. 10/18/2018 on evaluation today patient actually appears to be doing better with regard to his toe ulcer. He has been tolerating the dressing changes without complication. Fortunately there is no signs of active infection at this time. Overall very pleased in this regard and how things are doing. Patient likewise is pleased and happy to see that things are doing much better. Still he does have some ways to go to get this area to completely close. 10/30/2018 on evaluation today patient's ulcer on his plantar foot actually appears to be doing quite well at this time. He has been tolerating the dressing changes without complication which is excellent news. With that being said I do believe he is making progress there is some callus buildup on without the clear away today but other than that I am seeing evidence that this seems to be filling in from the base and is getting much smaller in the base. Still were not at the point of complete closure yet. Electronic Signature(s) Signed: 10/30/2018 6:11:07 PM By: Worthy Keeler PA-C Entered By: Worthy Keeler on 10/30/2018 18:11:07 Martin Mullen, Martin Mullen ([id number]) -------------------------------------------------------------------------------- Physical Exam Details Patient Name: Martin Mullen Date of Service: 10/30/2018 1:45 PM Medical Record Number: [id number] Patient Account Number: [id number] Date of Birth/Sex: [birth year] (38 y.o. M) Treating RN: Montey Hora Primary Care Provider: Webb Silversmith Other Clinician: Referring Provider: Webb Silversmith Treating Provider/Extender: STONE III, Ignace Mandigo Weeks in Treatment:  2 Constitutional Well-nourished and well-hydrated in no acute distress. Respiratory normal breathing without difficulty. Psychiatric this patient is able to make decisions and demonstrates good insight into disease process. Alert and Oriented x 3. pleasant and cooperative. Notes Patient's wound bed currently did require some sharp debridement I was able to perform this today without any significant pain post debridement the wound bed appears to be doing better he is making progress with new epithelization and granulation. With that being said this is somewhat slow but nonetheless at least is moving in the right direction. Electronic Signature(s) Signed: 10/30/2018 6:11:33 PM By: Worthy Keeler PA-C Entered By: Worthy Keeler on 10/30/2018 18:11:33 Martin Mullen, Martin Mullen ([id number]) -------------------------------------------------------------------------------- Physician Orders Details Patient Name: Martin Mullen Date of Service: 10/30/2018 1:45 PM Medical Record Number: [id number] Patient Account Number: [id number] Date of Birth/Sex: Aug 01, 1980 (38 y.o. M) Treating RN: Montey Hora Primary Care Provider: Webb Silversmith Other Clinician: Referring Provider: Webb Silversmith Treating Provider/Extender: Melburn Hake, Terina Mcelhinny Weeks in Treatment: 2 Verbal / Phone Orders: No Diagnosis Coding ICD-10 Coding Code Description E11.621 Type 2 diabetes mellitus with foot ulcer L97.522 Non-pressure chronic ulcer of other part of left foot with fat layer exposed I10 Essential (primary) hypertension I25.10 Atherosclerotic heart disease of native coronary artery without angina pectoris Wound Cleansing Wound #1 Left Toe Great o Clean wound with Normal Saline. - in office o Dial antibacterial soap, wash wounds, rinse and pat dry prior to dressing wounds Primary Wound Dressing Wound #1 Left Toe Great o Silver Collagen Secondary Dressing Wound #1 Left Toe Great o Foam o Other - secure with  tape Dressing Change Frequency Wound #1 Left Toe Great o Change dressing every other day. Follow-up Appointments Wound #1 Left Toe Great o Return Appointment in 1 week. Off-Loading Wound #1 Left Toe Great o Open toe surgical shoe with peg assist. Electronic Signature(s) Signed: 10/30/2018 3:57:57 PM By: Montey Hora Signed: 11/01/2018 2:05:35 AM By: Worthy Keeler PA-C Entered By: Montey Hora on 10/30/2018 14:47:09 Martin Mullen ([id number]) -------------------------------------------------------------------------------- Problem List Details Patient Name: Martin Mullen Date of Service: 10/30/2018  1:45 PM Medical Record Number: [id number] Patient Account Number: [id number] Date of Birth/Sex: January 11, 1981 (38 y.o. M) Treating RN: Curtis Sitesorthy, Joanna Primary Care Provider: Nicki ReaperBAITY, REGINA Other Clinician: Referring Provider: Nicki ReaperBAITY, REGINA Treating Provider/Extender: Linwood DibblesSTONE III, Zaiyden Strozier Weeks in Treatment: 2 Active Problems ICD-10 Evaluated Encounter Code Description Active Date Today Diagnosis E11.621 Type 2 diabetes mellitus with foot ulcer 10/11/2018 No Yes L97.522 Non-pressure chronic ulcer of other part of left foot with fat 10/11/2018 No Yes layer exposed I10 Essential (primary) hypertension 10/11/2018 No Yes I25.10 Atherosclerotic heart disease of native coronary artery 10/11/2018 No Yes without angina pectoris Inactive Problems Resolved Problems Electronic Signature(s) Signed: 10/30/2018 1:55:20 PM By: Lenda KelpStone III, Lenita Peregrina PA-C Entered By: Lenda KelpStone III, Dorene Bruni on 10/30/2018 13:55:20 Martin Mullen, Martin Mullen ([id number]) -------------------------------------------------------------------------------- Progress Note Details Patient Name: Martin Mullen, Martin Mullen Date of Service: 10/30/2018 1:45 PM Medical Record Number: [id number] Patient Account Number: [id number] Date of Birth/Sex: January 11, 1981 (38 y.o. M) Treating RN: Curtis Sitesorthy, Joanna Primary Care Provider: Nicki ReaperBAITY, REGINA Other Clinician: Referring Provider:  Nicki ReaperBAITY, REGINA Treating Provider/Extender: Linwood DibblesSTONE III, Andrae Claunch Weeks in Treatment: 2 Subjective Chief Complaint Information obtained from Patient left great toe ulcer History of Present Illness (HPI) 10/11/18 patient presents today for initial evaluation our clinic. He is being seen due to a left great toe ulcer which unfortunately has been present for quite some time. Initially he thought it really wasn't much of anything and then subsequently noted that he had some drainage then that seem to dry off and he didn't worry too much about it until more recently when he is had issues. 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 Subsequently he tells me he has not utilized any recreational drugs for two years. This is obviously excellent news. He also does have diabetes type II. It does sound like he is trying to improve things as far as his overall health status is concerned although his hemoglobin A-1 C as measured on 09/27/18 was 10.7. He did have an x-ray as well on 09/28/18 and this was negative for any evidence of acute or chronic osteomyelitis or fracture. Patient's wound is not appear to be severely infected at this time which is good news. With that being said he also doesn't really have any pain this is secondary to neuropathy. No fevers, chills, nausea, or vomiting noted at this time. 10/18/2018 on evaluation today patient actually appears to be doing better with regard to his toe ulcer. He has been tolerating the dressing changes without complication. Fortunately there is no signs of active infection at this time. Overall very pleased in this regard and how things are doing. Patient likewise is pleased and happy to see that things are doing much better. Still he does have some ways to go to get this area to completely close. 10/30/2018 on evaluation today patient's ulcer on his plantar  foot actually appears to be doing quite well at this time. He has been tolerating the dressing changes without complication which is excellent news. With that being said I do believe he is making progress there is some callus buildup on without the clear away today but other than that I am seeing evidence that this seems to be filling in from the base and is getting much smaller in the base. Still were not at the point of complete closure yet. Patient History Information obtained from Patient. Family History Diabetes - Mother,Father,Paternal Grandparents,Maternal Grandparents, No family history of  Cancer, Heart Disease, Hereditary Spherocytosis, Hypertension, Kidney Disease, Lung Disease, Seizures, Stroke, Thyroid Problems, Tuberculosis. Social History Current every day smoker, Marital Status - Single, Alcohol Use - Rarely, Drug Use - Prior History, Caffeine Use - Daily. Medical History Cardiovascular Patient has history of Coronary Artery Disease, Hypertension, Myocardial Infarction Denies history of Angina, Arrhythmia, Congestive Heart Failure, Deep Vein Thrombosis, Hypotension, Peripheral Arterial Disease, Peripheral Venous Disease, Phlebitis, Vasculitis Endocrine Patient has history of Type II Diabetes JERRYD, DIPPOLD ([id number]) Denies history of Type I Diabetes Integumentary (Skin) Denies history of History of Burn, History of pressure wounds Medical And Surgical History Notes Cardiovascular HLD Review of Systems (ROS) Constitutional Symptoms (General Health) Denies complaints or symptoms of Fatigue, Fever, Chills, Marked Weight Change. Respiratory Denies complaints or symptoms of Chronic or frequent coughs, Shortness of Breath. Cardiovascular Denies complaints or symptoms of Chest pain, LE edema. Psychiatric Denies complaints or symptoms of Anxiety, Claustrophobia. Objective Constitutional Well-nourished and well-hydrated in no acute distress. Vitals Time Taken: 2:02 PM,  Height: 70 in, Weight: 230 lbs, BMI: 33, Temperature: 97.9 F, Pulse: 98 bpm, Respiratory Rate: 16 breaths/min, Blood Pressure: 133/79 mmHg. Respiratory normal breathing without difficulty. Psychiatric this patient is able to make decisions and demonstrates good insight into disease process. Alert and Oriented x 3. pleasant and cooperative. General Notes: Patient's wound bed currently did require some sharp debridement I was able to perform this today without any significant pain post debridement the wound bed appears to be doing better he is making progress with new epithelization and granulation. With that being said this is somewhat slow but nonetheless at least is moving in the right direction. Integumentary (Hair, Skin) Wound #1 status is Open. Original cause of wound was Gradually Appeared. The wound is located on the Left Toe Great. The wound measures 0.2cm length x 0.2cm width x 0.5cm depth; 0.031cm^2 area and 0.016cm^3 volume. There is Fat Layer (Subcutaneous Tissue) Exposed exposed. There is no tunneling or undermining noted. There is a medium amount of serous drainage noted. The wound margin is flat and intact. There is small (1-33%) pink granulation within the wound bed. There is a large (67-100%) amount of necrotic tissue within the wound bed including Eschar. Martin Mullen, Martin Mullen ([id number]) Assessment Active Problems ICD-10 Type 2 diabetes mellitus with foot ulcer Non-pressure chronic ulcer of other part of left foot with fat layer exposed Essential (primary) hypertension Atherosclerotic heart disease of native coronary artery without angina pectoris Procedures Wound #1 Pre-procedure diagnosis of Wound #1 is a Diabetic Wound/Ulcer of the Lower Extremity located on the Left Toe Great .Severity of Tissue Pre Debridement is: Fat layer exposed. There was a Excisional Skin/Subcutaneous Tissue Debridement with a total area of 0.04 sq cm performed by STONE III, Shaydon Lease E., PA-C. With the  following instrument(s): Curette to remove Viable and Non-Viable tissue/material. Material removed includes Callus, Subcutaneous Tissue, and Slough after achieving pain control using Lidocaine 4% Topical Solution. No specimens were taken. A time out was conducted at 14:42, prior to the start of the procedure. A Minimum amount of bleeding was controlled with Pressure. The procedure was tolerated well with a pain level of 0 throughout and a pain level of 0 following the procedure. Post Debridement Measurements: 0.3cm length x 0.3cm width x 0.4cm depth; 0.028cm^3 volume. Character of Wound/Ulcer Post Debridement is improved. Severity of Tissue Post Debridement is: Fat layer exposed. Post procedure Diagnosis Wound #1: Same as Pre-Procedure Plan Wound Cleansing: Wound #1 Left Toe Great: Clean wound with Normal Saline. - in office  Dial antibacterial soap, wash wounds, rinse and pat dry prior to dressing wounds Primary Wound Dressing: Wound #1 Left Toe Great: Silver Collagen Secondary Dressing: Wound #1 Left Toe Great: Foam Other - secure with tape Dressing Change Frequency: Wound #1 Left Toe Great: Change dressing every other day. Follow-up Appointments: Wound #1 Left Toe Great: Return Appointment in 1 week. Off-Loading: Wound #1 Left Toe Great: Open toe surgical shoe with peg assist. Martin Mullen, Martin Mullen ([id number]) 1. I would recommend that we continue with the collagen dressing as I do feel like this is doing a good job for him. 2. I am going also suggest that we go ahead and continue with the PEG assist offloading shoe. If he is not making quite as much progress next week as would like to see I am going to suggest that we may want to initiate treatment with a total contact cast. This is his left foot so we should be able to do that without affecting his ability to work or drive for that matter. We will see patient back for reevaluation in 1 week here in the clinic. If anything worsens or  changes patient will contact our office for additional recommendations. Electronic Signature(s) Signed: 10/30/2018 6:12:44 PM By: Lenda KelpStone III, Elynn Patteson PA-C Entered By: Lenda KelpStone III, Lismary Kiehn on 10/30/2018 18:12:44 Martin Mullen, Catarino ([id number]) -------------------------------------------------------------------------------- ROS/PFSH Details Patient Name: Martin Mullen, Dawit Date of Service: 10/30/2018 1:45 PM Medical Record Number: [id number] Patient Account Number: [id number] Date of Birth/Sex: 25-Jun-1980 (38 y.o. M) Treating RN: Curtis Sitesorthy, Joanna Primary Care Provider: Nicki ReaperBAITY, REGINA Other Clinician: Referring Provider: Nicki ReaperBAITY, REGINA Treating Provider/Extender: Linwood DibblesSTONE III, Jacarri Gesner Weeks in Treatment: 2 Information Obtained From Patient Constitutional Symptoms (General Health) Complaints and Symptoms: Negative for: Fatigue; Fever; Chills; Marked Weight Change Respiratory Complaints and Symptoms: Negative for: Chronic or frequent coughs; Shortness of Breath Cardiovascular Complaints and Symptoms: Negative for: Chest pain; LE edema Medical History: Positive for: Coronary Artery Disease; Hypertension; Myocardial Infarction Negative for: Angina; Arrhythmia; Congestive Heart Failure; Deep Vein Thrombosis; Hypotension; Peripheral Arterial Disease; Peripheral Venous Disease; Phlebitis; Vasculitis Past Medical History Notes: HLD Psychiatric Complaints and Symptoms: Negative for: Anxiety; Claustrophobia Endocrine Medical History: Positive for: Type II Diabetes Negative for: Type I Diabetes Treated with: Oral agents Integumentary (Skin) Medical History: Negative for: History of Burn; History of pressure wounds Immunizations Pneumococcal Vaccine: Received Pneumococcal Vaccination: Yes Implantable Devices None Martin Mullen, W[place]ler ([id number]) Family and Social History Cancer: No; Diabetes: Yes - Mother,Father,Paternal Grandparents,Maternal Grandparents; Heart Disease: No; Hereditary Spherocytosis: No;  Hypertension: No; Kidney Disease: No; Lung Disease: No; Seizures: No; Stroke: No; Thyroid Problems: No; Tuberculosis: No; Current every day smoker; Marital Status - Single; Alcohol Use: Rarely; Drug Use: Prior History; Caffeine Use: Daily; Financial Concerns: No; Food, Clothing or Shelter Needs: No; Support System Lacking: No; Transportation Concerns: No Physician Affirmation I have reviewed and agree with the above information. Electronic Signature(s) Signed: 10/31/2018 4:36:54 PM By: Curtis Sitesorthy, Joanna Signed: 11/01/2018 2:05:35 AM By: Lenda KelpStone III, Willistine Ferrall PA-C Entered By: Lenda KelpStone III, Raychel Dowler on 10/30/2018 18:11:19 Martin Mullen, Lash ([id number]) -------------------------------------------------------------------------------- SuperBill Details Patient Name: Martin Mullen, Cartrell Date of Service: 10/30/2018 Medical Record Number: [id number] Patient Account Number: [id number] Date of Birth/Sex: 25-Jun-1980 (38 y.o. M) Treating RN: Curtis Sitesorthy, Joanna Primary Care Provider: Nicki ReaperBAITY, REGINA Other Clinician: Referring Provider: Nicki ReaperBAITY, REGINA Treating Provider/Extender: Linwood DibblesSTONE III, Enes Rokosz Weeks in Treatment: 2 Diagnosis Coding ICD-10 Codes Code Description E11.621 Type 2 diabetes mellitus with foot ulcer L97.522 Non-pressure chronic ulcer of other part of left foot with fat layer exposed I10 Essential (primary) hypertension I25.10 Atherosclerotic heart  disease of native coronary artery without angina pectoris Facility Procedures CPT4 Code: [id number] Description: 11042 - DEB SUBQ TISSUE 20 SQ CM/< ICD-10 Diagnosis Description L97.522 Non-pressure chronic ulcer of other part of left foot with fat Modifier: layer exposed Quantity: 1 Physician Procedures CPT4 Code: [id number] Description: 11042 - WC PHYS SUBQ TISS 20 SQ CM ICD-10 Diagnosis Description L97.522 Non-pressure chronic ulcer of other part of left foot with fat Modifier: layer exposed Quantity: 1 Electronic Signature(s) Signed: 10/30/2018 6:13:00 PM By: Lenda KelpStone III, Farris Geiman  PA-C Entered By: Lenda KelpStone III, Shajuana Mclucas on 10/30/2018 18:12:59

## 2018-10-30 NOTE — Progress Notes (Addendum)
BION, Martin Mullen ([id number]) Visit Report for 10/30/2018 Arrival Information Details Patient Name: Martin Mullen, Martin Mullen Date of Service: 10/30/2018 1:45 PM Medical Record Number: [id number] Patient Account Number: [id number] Date of Birth/Sex: 04-02-1980 (38 y.o. M) Treating RN: Army Melia Primary Care Stephaine Breshears: Webb Silversmith Other Clinician: Referring Renesme Kerrigan: Webb Silversmith Treating Dashley Monts/Extender: Melburn Hake, HOYT Weeks in Treatment: 2 Visit Information History Since Last Visit Added or deleted any medications: No Patient Arrived: Ambulatory Any new allergies or adverse reactions: No Arrival Time: 14:02 Had a fall or experienced change in No Accompanied By: self activities of daily living that may affect Transfer Assistance: None risk of falls: Signs or symptoms of abuse/neglect since last visito No Hospitalized since last visit: No Has Dressing in Place as Prescribed: Yes Pain Present Now: No Electronic Signature(s) Signed: 10/30/2018 2:46:54 PM By: Army Melia Entered By: Army Melia on 10/30/2018 14:02:15 Martin Mullen ([id number]) -------------------------------------------------------------------------------- Encounter Discharge Information Details Patient Name: Martin Mullen Date of Service: 10/30/2018 1:45 PM Medical Record Number: [id number] Patient Account Number: [id number] Date of Birth/Sex: 12-Nov-1980 (39 y.o. M) Treating RN: Montey Hora Primary Care Zamantha Strebel: Webb Silversmith Other Clinician: Referring Javarion Douty: Webb Silversmith Treating Caleigha Zale/Extender: Melburn Hake, HOYT Weeks in Treatment: 2 Encounter Discharge Information Items Post Procedure Vitals Discharge Condition: Stable Temperature (F): 97.9 Ambulatory Status: Ambulatory Pulse (bpm): 98 Discharge Destination: Home Respiratory Rate (breaths/min): 16 Transportation: Private Auto Blood Pressure (mmHg): 133/79 Accompanied By: self Schedule Follow-up Appointment: Yes Clinical Summary of Care: Electronic  Signature(s) Signed: 10/30/2018 3:57:57 PM By: Montey Hora Entered By: Montey Hora on 10/30/2018 14:50:06 Martin Mullen ([id number]) -------------------------------------------------------------------------------- Lower Extremity Assessment Details Patient Name: Martin Mullen Date of Service: 10/30/2018 1:45 PM Medical Record Number: [id number] Patient Account Number: [id number] Date of Birth/Sex: 09-12-1980 (38 y.o. M) Treating RN: Army Melia Primary Care Debbra Digiulio: Webb Silversmith Other Clinician: Referring Akasha Melena: Webb Silversmith Treating Addis Bennie/Extender: STONE III, HOYT Weeks in Treatment: 2 Edema Assessment Assessed: [Left: No] [Right: No] Edema: [Left: N] [Right: o] Vascular Assessment Pulses: Dorsalis Pedis Palpable: [Left:Yes] Electronic Signature(s) Signed: 10/30/2018 2:46:54 PM By: Army Melia Entered By: Army Melia on 10/30/2018 14:06:36 Martin Mullen ([id number]) -------------------------------------------------------------------------------- Multi Wound Chart Details Patient Name: Martin Mullen Date of Service: 10/30/2018 1:45 PM Medical Record Number: [id number] Patient Account Number: [id number] Date of Birth/Sex: [birth year] (38 y.o. M) Treating RN: Montey Hora Primary Care Julion Gatt: Webb Silversmith Other Clinician: Referring Kyzer Blowe: Webb Silversmith Treating Ziza Hastings/Extender: STONE III, HOYT Weeks in Treatment: 2 Vital Signs Height(in): 70 Pulse(bpm): 98 Weight(lbs): 230 Blood Pressure(mmHg): 133/79 Body Mass Index(BMI): 33 Temperature(F): 97.9 Respiratory Rate 16 (breaths/min): Photos: [N/A:N/A] Wound Location: Left Toe Great N/A N/A Wounding Event: Gradually Appeared N/A N/A Primary Etiology: Diabetic Wound/Ulcer of the N/A N/A Lower Extremity Comorbid History: Coronary Artery Disease, N/A N/A Hypertension, Myocardial Infarction, Type II Diabetes Date Acquired: 08/30/2018 N/A N/A Weeks of Treatment: 2 N/A N/A Wound Status: Open N/A  N/A Pending Amputation on Yes N/A N/A Presentation: Measurements L x W x D 0.2x0.2x0.5 N/A N/A (cm) Area (cm) : 0.031 N/A N/A Volume (cm) : 0.016 N/A N/A % Reduction in Area: -287.50% N/A N/A % Reduction in Volume: -220.00% N/A N/A Classification: Grade 1 N/A N/A Exudate Amount: Medium N/A N/A Exudate Type: Serous N/A N/A Exudate Color: amber N/A N/A Wound Margin: Flat and Intact N/A N/A Granulation Amount: Small (1-33%) N/A N/A Granulation Quality: Pink N/A N/A Necrotic Amount: Large (67-100%) N/A N/A Necrotic Tissue: Eschar N/A N/A Exposed Structures: Fat Layer (Subcutaneous N/A N/A Tissue) Exposed: Yes DAVARION, CUFFEE ([id number]) Fascia: No Tendon:  No Muscle: No Joint: No Bone: No Epithelialization: None N/A N/A Treatment Notes Electronic Signature(s) Signed: 10/30/2018 3:57:57 PM By: Curtis Sites Entered By: Curtis Sites on 10/30/2018 14:41:54 Martin Mullen ([id number]) -------------------------------------------------------------------------------- Multi-Disciplinary Care Plan Details Patient Name: Martin Mullen Date of Service: 10/30/2018 1:45 PM Medical Record Number: [id number] Patient Account Number: [id number] Date of Birth/Sex: Jan 13, 1981 (38 y.o. M) Treating RN: Curtis Sites Primary Care [place]o [place]: [name] Other Clinician: Referring Mikiah Durall: [name] Treating Machell Wirthlin/Extender: Linwood Dibbles, HOYT Weeks in Treatment: 2 Active Inactive Nutrition Nursing Diagnoses: Potential for alteratiion in Nutrition/Potential for imbalanced nutrition Goals: Patient/caregiver verbalizes understanding of need to maintain therapeutic glucose control per primary care physician Date Initiated: 10/11/2018 Target Resolution Date: 10/25/2018 Goal Status: Active Interventions: Assess HgA1c results as ordered upon admission and as needed Notes: Orientation to the Wound Care Program Nursing Diagnoses: Knowledge deficit related to the wound healing center  program Goals: Patient/caregiver will verbalize understanding of the Wound Healing Center Program Date Initiated: 10/11/2018 Target Resolution Date: 10/19/2018 Goal Status: Active Interventions: Provide education on orientation to the wound center Notes: Wound/Skin Impairment Nursing Diagnoses: Impaired tissue integrity Goals: Ulcer/skin breakdown will have a volume reduction of 30% by week 4 Date Initiated: 10/11/2018 Target Resolution Date: 10/19/2018 Goal Status: Active Interventions: Assess ulceration(s) every visit WEILAND, SERRETTE ([id number]) Notes: Electronic Signature(s) Signed: 10/30/2018 3:57:57 PM By: Curtis Sites Entered By: Curtis Sites on 10/30/2018 14:41:23 Martin Mullen ([id number]) -------------------------------------------------------------------------------- Pain Assessment Details Patient Name: Martin Mullen Date of Service: 10/30/2018 1:45 PM Medical Record Number: [id number] Patient Account Number: [id number] Date of Birth/Sex: Jan 11, 1981 (38 y.o. M) Treating RN: Rodell Perna Primary Care Isauro Skelley: [name] Other Clinician: Referring Syd Manges: [name] Treating Tekisha Darcey/Extender: Linwood Dibbles, HOYT Weeks in Treatment: 2 Active Problems Location of Pain Severity and Description of Pain Patient Has Paino No Site Locations Pain Management and Medication Current Pain Management: Electronic Signature(s) Signed: 10/30/2018 2:46:54 PM By: Rodell Perna Entered By: Rodell Perna on 10/30/2018 14:02:23 Martin Mullen ([id number]) -------------------------------------------------------------------------------- Patient/Caregiver Education Details Patient Name: Martin Mullen Date of Service: 10/30/2018 1:45 PM Medical Record Number: [id number] Patient Account Number: [id number] Date of Birth/Gender: Feb 29, 1980 (38 y.o. M) Treating RN: Curtis Sites Primary Care Physician: [name] Other Clinician: Referring Physician: [name] Treating  Physician/Extender: Skeet Simmer in Treatment: 2 Education Assessment Education Provided To: Patient Education Topics Provided Wound/Skin Impairment: Handouts: Other: wound care as ordered Methods: Demonstration, Explain/Verbal Responses: State content correctly Electronic Signature(s) Signed: 10/30/2018 3:57:57 PM By: Curtis Sites Entered By: Curtis Sites on 10/30/2018 14:49:19 Martin Mullen ([id number]) -------------------------------------------------------------------------------- Wound Assessment Details Patient Name: Martin Mullen Date of Service: 10/30/2018 1:45 PM Medical Record Number: [id number] Patient Account Number: [id number] Date of Birth/Sex: 02/27/1980 (38 y.o. M) Treating RN: Rodell Perna Primary Care Tyland Klemens: [name] Other Clinician: Referring Jassmin Kemmerer: [name] Treating Mirren Gest/Extender: STONE III, HOYT Weeks in Treatment: 2 Wound Status Wound Number: 1 Primary Diabetic Wound/Ulcer of the Lower Extremity Etiology: Wound Location: Left Toe Great Wound Open Wounding Event: Gradually Appeared Status: Date Acquired: 08/30/2018 Comorbid Coronary Artery Disease, Hypertension, Weeks Of Treatment: 2 History: Myocardial Infarction, Type II Diabetes Clustered Wound: No Pending Amputation On Presentation Photos Wound Measurements Length: (cm) 0.2 % Reduction in Width: (cm) 0.2 % Reduction in Depth: (cm) 0.5 Epithelializati Area: (cm) 0.031 Tunneling: Volume: (cm) 0.016 Undermining: Area: -287.5% Volume: -220% on: None No No Wound Description Classification: Grade 1 Foul Odor Afte Wound Margin: Flat and Intact Slough/Fibrino Exudate Amount: Medium Exudate Type: Serous Exudate Color: amber r Cleansing: No No  Wound Bed Granulation Amount: Small (1-33%) Exposed Structure Granulation Quality: Pink Fascia Exposed: No Necrotic Amount: Large (67-100%) Fat Layer (Subcutaneous Tissue) Exposed: Yes Necrotic Quality:  Eschar Tendon Exposed: No Muscle Exposed: No Joint Exposed: No Bone Exposed: No Treatment Notes Martin JeffersonOLIVER, Kemoni ([id number]) Wound #1 (Left Toe Great) Notes Silver collagen, foam, medipore tape, peg assist shoe Left Electronic Signature(s) Signed: 10/30/2018 2:46:54 PM By: Rodell PernaScott, Dajea Entered By: Rodell PernaScott, Dajea on 10/30/2018 14:06:13 Martin JeffersonLIVER, Jermichael ([id number]) -------------------------------------------------------------------------------- Vitals Details Patient Name: Martin JeffersonLIVER, Bernerd Date of Service: 10/30/2018 1:45 PM Medical Record Number: [id number] Patient Account Number: [id number] Date of Birth/Sex: December 17, 1980 (38 y.o. M) Treating RN: Rodell PernaScott, Dajea Primary Care Keyarah Mcroy: Nicki ReaperBAITY, REGINA Other Clinician: Referring Rinaldo Macqueen: Nicki ReaperBAITY, REGINA Treating Tracina Beaumont/Extender: STONE III, HOYT Weeks in Treatment: 2 Vital Signs Time Taken: 14:02 Temperature (F): 97.9 Height (in): 70 Pulse (bpm): 98 Weight (lbs): 230 Respiratory Rate (breaths/min): 16 Body Mass Index (BMI): 33 Blood Pressure (mmHg): 133/79 Reference Range: 80 - 120 mg / dl Electronic Signature(s) Signed: 10/30/2018 2:46:54 PM By: Rodell PernaScott, Dajea Entered By: Rodell PernaScott, Dajea on 10/30/2018 14:03:59

## 2018-11-02 ENCOUNTER — Ambulatory Visit: Payer: BC Managed Care – PPO | Admitting: Physician Assistant

## 2018-11-08 ENCOUNTER — Ambulatory Visit: Payer: BC Managed Care – PPO | Admitting: Physician Assistant

## 2018-11-09 ENCOUNTER — Telehealth: Payer: Self-pay | Admitting: Internal Medicine

## 2018-11-09 ENCOUNTER — Encounter: Payer: BC Managed Care – PPO | Admitting: Physician Assistant

## 2018-11-09 ENCOUNTER — Other Ambulatory Visit: Payer: Self-pay

## 2018-11-09 DIAGNOSIS — I2510 Atherosclerotic heart disease of native coronary artery without angina pectoris: Secondary | ICD-10-CM | POA: Diagnosis not present

## 2018-11-09 DIAGNOSIS — E1142 Type 2 diabetes mellitus with diabetic polyneuropathy: Secondary | ICD-10-CM | POA: Diagnosis not present

## 2018-11-09 DIAGNOSIS — E785 Hyperlipidemia, unspecified: Secondary | ICD-10-CM | POA: Diagnosis not present

## 2018-11-09 DIAGNOSIS — I252 Old myocardial infarction: Secondary | ICD-10-CM | POA: Diagnosis not present

## 2018-11-09 DIAGNOSIS — E11621 Type 2 diabetes mellitus with foot ulcer: Secondary | ICD-10-CM | POA: Diagnosis not present

## 2018-11-09 DIAGNOSIS — I10 Essential (primary) hypertension: Secondary | ICD-10-CM | POA: Diagnosis not present

## 2018-11-09 DIAGNOSIS — L97522 Non-pressure chronic ulcer of other part of left foot with fat layer exposed: Secondary | ICD-10-CM | POA: Diagnosis not present

## 2018-11-09 DIAGNOSIS — Z955 Presence of coronary angioplasty implant and graft: Secondary | ICD-10-CM | POA: Diagnosis not present

## 2018-11-09 MED ORDER — INSULIN DETEMIR 100 UNIT/ML FLEXPEN: 23.0000 [IU] | PEN_INJECTOR | Freq: Every day | SUBCUTANEOUS | 0 refills | Status: DC

## 2018-11-09 MED ORDER — INSULIN PEN NEEDLE 31G X 8 MM MISC: 1.0000 | Freq: Every day | 1 refills | Status: DC

## 2018-11-09 NOTE — Telephone Encounter (Signed)
Patient's insurance is requiring patient to do a 3 month rx  For Levemir and a use a new pharmacy-[facility] [address]. Patient said [facility] told patient they have been waiting for 2 weeks to have the rx rewritten by Rollene Fare.  Patient said he had discussed getting a rx for Acid Reflux.  Patient said he didn't hear anything from [place].  He was wondering if the medication had been sent to [facility].

## 2018-11-09 NOTE — Progress Notes (Addendum)
Martin Mullen, Martin Mullen ([id number]) Visit Report for 11/09/2018 Chief Complaint Document Details Patient Name: Martin Mullen, Martin Mullen Date of Service: 11/09/2018 2:30 PM Medical Record Number: [id number] Patient Account Number: [id number] Date of Birth/Sex: 09/03/1980 (38 y.o. M) Treating RN: Curtis Sitesorthy, Joanna Primary Care Provider: Nicki ReaperBAITY, REGINA Other Clinician: Referring Provider: Nicki ReaperBAITY, REGINA Treating Provider/Extender: Linwood DibblesSTONE III, HOYT Weeks in Treatment: 4 Information Obtained from: Patient Chief Complaint left great toe ulcer Electronic Signature(s) Signed: 11/09/2018 3:10:25 PM By: Lenda KelpStone III, Hoyt PA-C Entered By: Lenda KelpStone III, Hoyt on 11/09/2018 15:10:24 Martin Mullen, Martin Mullen ([id number]) -------------------------------------------------------------------------------- Debridement Details Patient Name: Martin Mullen, Martin Mullen Date of Service: 11/09/2018 2:30 PM Medical Record Number: [id number] Patient Account Number: [id number] Date of Birth/Sex: 09/03/1980 (38 y.o. M) Treating RN: Curtis Sitesorthy, Joanna Primary Care Provider: Nicki ReaperBAITY, REGINA Other Clinician: Referring Provider: Nicki ReaperBAITY, REGINA Treating Provider/Extender: Linwood DibblesSTONE III, HOYT Weeks in Treatment: 4 Debridement Performed for Wound #1 Left Toe Great Assessment: Performed By: Physician STONE III, HOYT E., PA-C Debridement Type: Debridement Severity of Tissue Pre Fat layer exposed Debridement: Level of Consciousness (Pre- Awake and Alert procedure): Pre-procedure Verification/Time Yes - 15:14 Out Taken: Start Time: 15:14 Pain Control: Lidocaine 4% Topical Solution Total Area Debrided (L x W): 0.5 (cm) x 0.5 (cm) = 0.25 (cm) Tissue and other material Viable, Non-Viable, Callus, Slough, Subcutaneous, Slough debrided: Level: Skin/Subcutaneous Tissue Debridement Description: Excisional Instrument: Curette Bleeding: Minimum Hemostasis Achieved: Pressure End Time: 15:18 Procedural Pain: 0 Post Procedural Pain: 0 Response to Treatment: Procedure was tolerated  well Level of Consciousness Awake and Alert (Post-procedure): Post Debridement Measurements of Total Wound Length: (cm) 0.5 Width: (cm) 0.2 Depth: (cm) 0.5 Volume: (cm) 0.039 Character of Wound/Ulcer Post Debridement: Improved Severity of Tissue Post Debridement: Fat layer exposed Post Procedure Diagnosis Same as Pre-procedure Electronic Signature(s) Signed: 11/09/2018 4:44:47 PM By: Curtis Sitesorthy, Joanna Signed: 11/09/2018 5:02:54 PM By: Lenda KelpStone III, Hoyt PA-C Entered By: Curtis Sitesorthy, Joanna on 11/09/2018 15:19:10 Martin Mullen, Martin Mullen ([id number]) -------------------------------------------------------------------------------- HPI Details Patient Name: Martin Mullen, Martin Mullen Date of Service: 11/09/2018 2:30 PM Medical Record Number: [id number] Patient Account Number: [id number] Date of Birth/Sex: 09/03/1980 (38 y.o. M) Treating RN: Curtis Sitesorthy, Joanna Primary Care Provider: Nicki ReaperBAITY, REGINA Other Clinician: Referring Provider: Nicki ReaperBAITY, REGINA Treating Provider/Extender: Linwood DibblesSTONE III, HOYT Weeks in Treatment: 4 History of Present Illness HPI Description: 10/11/18 patient presents today for initial evaluation our clinic. He is being seen due to a left great toe ulcer which unfortunately has been present for quite some time. Initially he thought it really wasn't much of anything and then subsequently noted that he had some drainage then that seem to dry off and he didn't worry too much about it until more recently when he is had issues. 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 Subsequently he tells me he has not utilized any recreational drugs for two years. This is obviously excellent news. He also does have diabetes type II. It does sound like he is trying to improve things as far as his overall health status is concerned although his hemoglobin A-1 C as measured on 09/27/18 was 10.7. He did have an x-ray as well  on 09/28/18 and this was negative for any evidence of acute or chronic osteomyelitis or fracture. Patient's wound is not appear to be severely infected at this time which is good news. With that being said he also doesn't really have any pain this is secondary to neuropathy. No fevers, chills, nausea, or vomiting noted at this  time. 10/18/2018 on evaluation today patient actually appears to be doing better with regard to his toe ulcer. He has been tolerating the dressing changes without complication. Fortunately there is no signs of active infection at this time. Overall very pleased in this regard and how things are doing. Patient likewise is pleased and happy to see that things are doing much better. Still he does have some ways to go to get this area to completely close. 10/30/2018 on evaluation today patient's ulcer on his plantar foot actually appears to be doing quite well at this time. He has been tolerating the dressing changes without complication which is excellent news. With that being said I do believe he is making progress there is some callus buildup on without the clear away today but other than that I am seeing evidence that this seems to be filling in from the base and is getting much smaller in the base. Still were not at the point of complete closure yet. 11/09/2018 on evaluation today patient actually appears to be doing quite well with regard to his toe ulcer from the standpoint of infection. I do not see any signs of active infection at this time which is good news. With that being said he unfortunately is not really making much progress either he continues to have depth of the wound despite the fact that it seems to be callused over initially on inspection when I probed and actually remove some of the callus it is much deeper. Fortunately there is no signs of active infection at this time. Electronic Signature(s) Signed: 11/09/2018 3:26:17 PM By: Lenda Kelp PA-C Entered By:  Lenda Kelp on 11/09/2018 15:26:17 Martin Mullen, CAYEA ([id number]) -------------------------------------------------------------------------------- Physical Exam Details Patient Name: Martin Mullen Date of Service: 11/09/2018 2:30 PM Medical Record Number: [id number] Patient Account Number: [id number] Date of Birth/Sex: 03/15/1980 (38 y.o. M) Treating RN: Curtis Sites Primary Care Provider: [name] Other Clinician: Referring Provider: [name] Treating Provider/Extender: STONE III, HOYT Weeks in Treatment: 4 Constitutional Well-nourished and well-hydrated in no acute distress. Respiratory normal breathing without difficulty. clear to auscultation bilaterally. Cardiovascular regular rate and rhythm with normal S1, S2. Psychiatric this patient is able to make decisions and demonstrates good insight into disease process. Alert and Oriented x 3. pleasant and cooperative. Notes Patient's wound bed currently did require sharp debridement and this was performed today without any complication. There was some bleeding and hemostasis was achieved with pressure. With that being said I did remove some of the epiboly edges from the wound which also seem to do well for him. I think this will allow the area to be able to heal if we can keep it open and draining. I am in a try gentamicin with some packing strip to give that a shot. If it is not doing better by next week I think a total contact cast will be appropriate. Electronic Signature(s) Signed: 11/09/2018 3:26:54 PM By: Lenda Kelp PA-C Entered By: Lenda Kelp on 11/09/2018 15:26:54 JARVIS, YOCHUM ([id number]) -------------------------------------------------------------------------------- Physician Orders Details Patient Name: Martin Mullen Date of Service: 11/09/2018 2:30 PM Medical Record Number: [id number] Patient Account Number: [id number] Date of Birth/Sex: 10/18/1980 (39 y.o. M) Treating RN: Curtis Sites Primary  Care Provider: [name] Other Clinician: Referring Provider: [name] Treating Provider/Extender: Linwood Dibbles, HOYT Weeks in Treatment: 4 Verbal / Phone Orders: No Diagnosis Coding ICD-10 Coding Code Description E11.621 Type 2 diabetes mellitus with foot ulcer L97.522 Non-pressure chronic ulcer of other part of left foot  with fat layer exposed I10 Essential (primary) hypertension I25.10 Atherosclerotic heart disease of native coronary artery without angina pectoris Wound Cleansing Wound #1 Left Toe Great o Clean wound with Normal Saline. - in office o Dial antibacterial soap, wash wounds, rinse and pat dry prior to dressing wounds Primary Wound Dressing Wound #1 Left Toe Great o Gentamicin Sulfate Cream o Iodoform packing Gauze - 1/4 inch (cut length in half if needed) Secondary Dressing Wound #1 Left Toe Great o Foam o Other - secure with tape Dressing Change Frequency Wound #1 Left Toe Great o Change dressing every other day. Follow-up Appointments Wound #1 Left Toe Great o Return Appointment in 1 week. Off-Loading Wound #1 Left Toe Great o Open toe surgical shoe with peg assist. Patient Medications Allergies: No Known Drug Allergies Notifications Medication Indication Start End gentamicin 11/09/2018 DOSE topical 0.1 % cream - cream topical placed into the wound bed with each dressing change as directed Martin Mullen, Martin Mullen ([id number]) Electronic Signature(s) Signed: 11/09/2018 3:29:20 PM By: Lenda Kelp PA-C Entered By: Lenda Kelp on 11/09/2018 15:29:19 Martin Mullen ([id number]) -------------------------------------------------------------------------------- Problem List Details Patient Name: Martin Mullen Date of Service: 11/09/2018 2:30 PM Medical Record Number: [id number] Patient Account Number: [id number] Date of Birth/Sex: Jan 01, 1981 (38 y.o. M) Treating RN: Curtis Sites Primary Care Provider: [name] Other  Clinician: Referring Provider: [name] Treating Provider/Extender: Linwood Dibbles, HOYT Weeks in Treatment: 4 Active Problems ICD-10 Evaluated Encounter Code Description Active Date Today Diagnosis E11.621 Type 2 diabetes mellitus with foot ulcer 10/11/2018 No Yes L97.522 Non-pressure chronic ulcer of other part of left foot with fat 10/11/2018 No Yes layer exposed I10 Essential (primary) hypertension 10/11/2018 No Yes I25.10 Atherosclerotic heart disease of native coronary artery 10/11/2018 No Yes without angina pectoris Inactive Problems Resolved Problems Electronic Signature(s) Signed: 11/09/2018 3:10:07 PM By: Lenda Kelp PA-C Entered By: Lenda Kelp on 11/09/2018 15:10:06 Martin Mullen ([id number]) -------------------------------------------------------------------------------- Progress Note Details Patient Name: Martin Mullen Date of Service: 11/09/2018 2:30 PM Medical Record Number: [id number] Patient Account Number: [id number] Date of Birth/Sex: [birth year] (38 y.o. M) Treating RN: Curtis Sites Primary Care Provider: [name] Other Clinician: Referring Provider: [name] Treating Provider/Extender: Linwood Dibbles, HOYT Weeks in Treatment: 4 Subjective Chief Complaint Information obtained from Patient left great toe ulcer History of Present Illness (HPI) 10/11/18 patient presents today for initial evaluation our clinic. He is being seen due to a left great toe ulcer which unfortunately has been present for quite some time. Initially he thought it really wasn't much of anything and then subsequently noted that he had some drainage then that seem to dry off and he didn't worry too much about it until more recently when he is had issues. 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 Subsequently he tells me he has not utilized any recreational drugs for two  years. This is obviously excellent news. He also does have diabetes type II. It does sound like he is trying to improve things as far as his overall health status is concerned although his hemoglobin A-1 C as measured on 09/27/18 was 10.7. He did have an x-ray as well on 09/28/18 and this was negative for any evidence of acute or chronic osteomyelitis or fracture. Patient's wound is not appear to be severely infected at this time which is good news. With that being said he also doesn't really have any pain  this is secondary to neuropathy. No fevers, chills, nausea, or vomiting noted at this time. 10/18/2018 on evaluation today patient actually appears to be doing better with regard to his toe ulcer. He has been tolerating the dressing changes without complication. Fortunately there is no signs of active infection at this time. Overall very pleased in this regard and how things are doing. Patient likewise is pleased and happy to see that things are doing much better. Still he does have some ways to go to get this area to completely close. 10/30/2018 on evaluation today patient's ulcer on his plantar foot actually appears to be doing quite well at this time. He has been tolerating the dressing changes without complication which is excellent news. With that being said I do believe he is making progress there is some callus buildup on without the clear away today but other than that I am seeing evidence that this seems to be filling in from the base and is getting much smaller in the base. Still were not at the point of complete closure yet. 11/09/2018 on evaluation today patient actually appears to be doing quite well with regard to his toe ulcer from the standpoint of infection. I do not see any signs of active infection at this time which is good news. With that being said he unfortunately is not really making much progress either he continues to have depth of the wound despite the fact that it seems to be  callused over initially on inspection when I probed and actually remove some of the callus it is much deeper. Fortunately there is no signs of active infection at this time. Patient History Information obtained from Patient. Family History Diabetes - Mother,Father,Paternal Grandparents,Maternal Grandparents, No family history of Cancer, Heart Disease, Hereditary Spherocytosis, Hypertension, Kidney Disease, Lung Disease, Seizures, Stroke, Thyroid Problems, Tuberculosis. Social History Current every day smoker, Marital Status - Single, Alcohol Use - Rarely, Drug Use - Prior History, Caffeine Use - Daily. Medical History Martin Mullen, Martin Mullen ([id number]) Cardiovascular Patient has history of Coronary Artery Disease, Hypertension, Myocardial Infarction Denies history of Angina, Arrhythmia, Congestive Heart Failure, Deep Vein Thrombosis, Hypotension, Peripheral Arterial Disease, Peripheral Venous Disease, Phlebitis, Vasculitis Endocrine Patient has history of Type II Diabetes Denies history of Type I Diabetes Integumentary (Skin) Denies history of History of Burn, History of pressure wounds Medical And Surgical History Notes Cardiovascular HLD Review of Systems (ROS) Constitutional Symptoms (General Health) Denies complaints or symptoms of Fatigue, Fever, Chills, Marked Weight Change. Respiratory Denies complaints or symptoms of Chronic or frequent coughs, Shortness of Breath. Cardiovascular Denies complaints or symptoms of Chest pain, LE edema. Psychiatric Denies complaints or symptoms of Anxiety, Claustrophobia. Objective Constitutional Well-nourished and well-hydrated in no acute distress. Vitals Time Taken: 3:02 PM, Height: 70 in, Weight: 230 lbs, BMI: 33, Temperature: 97.9 F, Pulse: 98 bpm, Respiratory Rate: 16 breaths/min, Blood Pressure: 131/57 mmHg. Respiratory normal breathing without difficulty. clear to auscultation bilaterally. Cardiovascular regular rate and rhythm with  normal S1, S2. Psychiatric this patient is able to make decisions and demonstrates good insight into disease process. Alert and Oriented x 3. pleasant and cooperative. General Notes: Patient's wound bed currently did require sharp debridement and this was performed today without any complication. There was some bleeding and hemostasis was achieved with pressure. With that being said I did remove some of the epiboly edges from the wound which also seem to do well for him. I think this will allow the area to be able to heal if we can  keep it open and draining. I am in a try gentamicin with some packing strip to give that a shot. If it is not doing better by next week I think a total contact cast will be appropriate. Integumentary (Hair, Skin) Wound #1 status is Open. Original cause of wound was Gradually Appeared. The wound is located on the Left Toe Great. Martin Mullen, Martin Mullen ([id number]) The wound measures 0.5cm length x 0.5cm width x 0.1cm depth; 0.196cm^2 area and 0.02cm^3 volume. There is Fat Layer (Subcutaneous Tissue) Exposed exposed. There is no tunneling or undermining noted. There is a medium amount of serous drainage noted. The wound margin is flat and intact. There is small (1-33%) pink granulation within the wound bed. There is a large (67-100%) amount of necrotic tissue within the wound bed including Eschar. Assessment Active Problems ICD-10 Type 2 diabetes mellitus with foot ulcer Non-pressure chronic ulcer of other part of left foot with fat layer exposed Essential (primary) hypertension Atherosclerotic heart disease of native coronary artery without angina pectoris Procedures Wound #1 Pre-procedure diagnosis of Wound #1 is a Diabetic Wound/Ulcer of the Lower Extremity located on the Left Toe Great .Severity of Tissue Pre Debridement is: Fat layer exposed. There was a Excisional Skin/Subcutaneous Tissue Debridement with a total area of 0.25 sq cm performed by STONE III, HOYT E.,  PA-C. With the following instrument(s): Curette to remove Viable and Non-Viable tissue/material. Material removed includes Callus, Subcutaneous Tissue, and Slough after achieving pain control using Lidocaine 4% Topical Solution. No specimens were taken. A time out was conducted at 15:14, prior to the start of the procedure. A Minimum amount of bleeding was controlled with Pressure. The procedure was tolerated well with a pain level of 0 throughout and a pain level of 0 following the procedure. Post Debridement Measurements: 0.5cm length x 0.2cm width x 0.5cm depth; 0.039cm^3 volume. Character of Wound/Ulcer Post Debridement is improved. Severity of Tissue Post Debridement is: Fat layer exposed. Post procedure Diagnosis Wound #1: Same as Pre-Procedure Plan Wound Cleansing: Wound #1 Left Toe Great: Clean wound with Normal Saline. - in office Dial antibacterial soap, wash wounds, rinse and pat dry prior to dressing wounds Primary Wound Dressing: Wound #1 Left Toe Great: Gentamicin Sulfate Cream Iodoform packing Gauze - 1/4 inch (cut length in half if needed) Secondary Dressing: Wound #1 Left Toe Great: Foam Other - secure with tape Dressing Change Frequency: Wound #1 Left Toe GreatEvalee Mullen: Mullen, Martin Mullen ([id number]) Change dressing every other day. Follow-up Appointments: Wound #1 Left Toe Great: Return Appointment in 1 week. Off-Loading: Wound #1 Left Toe Great: Open toe surgical shoe with peg assist. The following medication(s) was prescribed: gentamicin topical 0.1 % cream cream topical placed into the wound bed with each dressing change as directed starting 11/09/2018 1 I will go ahead and initiate treatment with gentamicin cream along with packing strip into the wound to try to help keep this open and allow this to granulate in from the bottom out. 2. I am also going to suggest that we go ahead and continue with using the foam for offloading at this time and the peg assist offloading  shoe which the patient is wearing. 3. I am also going to suggest that again if he is not doing better by next week and showing signs of definite improvement we probably need to go ahead and see about a total contact cast for him I discussed that with him today. We will see patient back for reevaluation in 1 week here in the clinic.  If anything worsens or changes patient will contact our office for additional recommendations. Electronic Signature(s) Signed: 11/09/2018 3:29:33 PM By: Worthy Keeler PA-C Previous Signature: 11/09/2018 3:27:36 PM Version By: Worthy Keeler PA-C Entered By: Worthy Keeler on 11/09/2018 15:29:33 CARNEL, STEGMAN ([id number]) -------------------------------------------------------------------------------- ROS/PFSH Details Patient Name: Martin Mullen Date of Service: 11/09/2018 2:30 PM Medical Record Number: [id number] Patient Account Number: [id number] Date of Birth/Sex: 01/22/1981 (38 y.o. M) Treating RN: Montey Hora Primary Care Provider: Webb Silversmith Other Clinician: Referring Provider: Webb Silversmith Treating Provider/Extender: Melburn Hake, HOYT Weeks in Treatment: 4 Information Obtained From Patient Constitutional Symptoms (General Health) Complaints and Symptoms: Negative for: Fatigue; Fever; Chills; Marked Weight Change Respiratory Complaints and Symptoms: Negative for: Chronic or frequent coughs; Shortness of Breath Cardiovascular Complaints and Symptoms: Negative for: Chest pain; LE edema Medical History: Positive for: Coronary Artery Disease; Hypertension; Myocardial Infarction Negative for: Angina; Arrhythmia; Congestive Heart Failure; Deep Vein Thrombosis; Hypotension; Peripheral Arterial Disease; Peripheral Venous Disease; Phlebitis; Vasculitis Past Medical History Notes: HLD Psychiatric Complaints and Symptoms: Negative for: Anxiety; Claustrophobia Endocrine Medical History: Positive for: Type II Diabetes Negative for: Type I  Diabetes Treated with: Oral agents Integumentary (Skin) Medical History: Negative for: History of Burn; History of pressure wounds Immunizations Pneumococcal Vaccine: Received Pneumococcal Vaccination: Yes Implantable Devices None LUIS, NICKLES ([id number]) Family and Social History Cancer: No; Diabetes: Yes - Mother,Father,Paternal Grandparents,Maternal Grandparents; Heart Disease: No; Hereditary Spherocytosis: No; Hypertension: No; Kidney Disease: No; Lung Disease: No; Seizures: No; Stroke: No; Thyroid Problems: No; Tuberculosis: No; Current every day smoker; Marital Status - Single; Alcohol Use: Rarely; Drug Use: Prior History; Caffeine Use: Daily; Financial Concerns: No; Food, Clothing or Shelter Needs: No; Support System Lacking: No; Transportation Concerns: No Physician Affirmation I have reviewed and agree with the above information. Electronic Signature(s) Signed: 11/09/2018 4:44:47 PM By: Montey Hora Signed: 11/09/2018 5:02:54 PM By: Worthy Keeler PA-C Entered By: Worthy Keeler on 11/09/2018 15:26:36 ZADIN, LANGE ([id number]) -------------------------------------------------------------------------------- SuperBill Details Patient Name: Martin Mullen Date of Service: 11/09/2018 Medical Record Number: [id number] Patient Account Number: [id number] Date of Birth/Sex: 1980/04/20 (38 y.o. M) Treating RN: Montey Hora Primary Care Provider: Webb Silversmith Other Clinician: Referring Provider: Webb Silversmith Treating Provider/Extender: Melburn Hake, HOYT Weeks in Treatment: 4 Diagnosis Coding ICD-10 Codes Code Description E11.621 Type 2 diabetes mellitus with foot ulcer L97.522 Non-pressure chronic ulcer of other part of left foot with fat layer exposed I10 Essential (primary) hypertension I25.10 Atherosclerotic heart disease of native coronary artery without angina pectoris Facility Procedures CPT4 Code: [id number] Description: 90300 - DEB SUBQ TISSUE 20 SQ CM/< ICD-10  Diagnosis Description L97.522 Non-pressure chronic ulcer of other part of left foot with fat Modifier: layer exposed Quantity: 1 Physician Procedures CPT4 Code Description: [id number] 99214 - WC PHYS LEVEL 4 - EST PT ICD-10 Diagnosis Description E11.621 Type 2 diabetes mellitus with foot ulcer L97.522 Non-pressure chronic ulcer of other part of left foot with fat I10 Essential (primary) hypertension  I25.10 Atherosclerotic heart disease of native coronary artery without Modifier: 25 layer exposed angina pectoris Quantity: 1 CPT4 Code Description: [id number] - WC PHYS SUBQ TISS 20 SQ CM ICD-10 Diagnosis Description L97.522 Non-pressure chronic ulcer of other part of left foot with fat Modifier: layer exposed Quantity: 1 Electronic Signature(s) Signed: 11/09/2018 3:27:54 PM By: Worthy Keeler PA-C Entered By: Worthy Keeler on 11/09/2018 15:27:54

## 2018-11-09 NOTE — Progress Notes (Signed)
MAYNOR, MWANGI ([id number]) Visit Report for 11/09/2018 Arrival Information Details Patient Name: Martin Mullen Date of Service: 11/09/2018 2:30 PM Medical Record Number: [id number] Patient Account Number: [id number] Date of Birth/Sex: March 24, 1980 (38 y.o. M) Treating RN: Rodell Perna Primary Care Story Conti: [name] Other Clinician: Referring Aveer Bartow: [name] Treating Suzetta Timko/Extender: Linwood Dibbles, HOYT Weeks in Treatment: 4 Visit Information History Since Last Visit Added or deleted any medications: No Patient Arrived: Ambulatory Any new allergies or adverse reactions: No Arrival Time: 15:02 Had a fall or experienced change in No Accompanied By: self activities of daily living that may affect Transfer Assistance: Manual risk of falls: Patient Identification Verified: Yes Signs or symptoms of abuse/neglect since last visito No Hospitalized since last visit: No Has Dressing in Place as Prescribed: Yes Pain Present Now: No Electronic Signature(s) Signed: 11/09/2018 4:09:41 PM By: Rodell Perna Entered By: Rodell Perna on 11/09/2018 15:02:50 Martin Mullen ([id number]) -------------------------------------------------------------------------------- Encounter Discharge Information Details Patient Name: Martin Mullen Date of Service: 11/09/2018 2:30 PM Medical Record Number: [id number] Patient Account Number: [id number] Date of Birth/Sex: [birth year] (38 y.o. M) Treating RN: Curtis Sites Primary Care Mar Walmer: [name] Other Clinician: Referring Berline Semrad: [name] Treating Danaysia Rader/Extender: Linwood Dibbles, HOYT Weeks in Treatment: 4 Encounter Discharge Information Items Post Procedure Vitals Discharge Condition: Stable Temperature (F): 97.9 Ambulatory Status: Ambulatory Pulse (bpm): 98 Discharge Destination: Home Respiratory Rate (breaths/min): 16 Transportation: Private Auto Blood Pressure (mmHg): 131/57 Accompanied By: self Schedule Follow-up Appointment:  Yes Clinical Summary of Care: Electronic Signature(s) Signed: 11/09/2018 4:44:47 PM By: Curtis Sites Entered By: Curtis Sites on 11/09/2018 15:22:31 Martin Mullen ([id number]) -------------------------------------------------------------------------------- Lower Extremity Assessment Details Patient Name: Martin Mullen Date of Service: 11/09/2018 2:30 PM Medical Record Number: [id number] Patient Account Number: [id number] Date of Birth/Sex: 1980/06/11 (38 y.o. M) Treating RN: Rodell Perna Primary Care Maxx Pham: [name] Other Clinician: Referring Jenissa Tyrell: [name] Treating Analya Louissaint/Extender: STONE III, HOYT Weeks in Treatment: 4 Edema Assessment Assessed: [Left: No] [Right: No] Edema: [Left: N] [Right: o] Vascular Assessment Pulses: Dorsalis Pedis Palpable: [Left:Yes] Electronic Signature(s) Signed: 11/09/2018 4:09:41 PM By: Rodell Perna Entered By: Rodell Perna on 11/09/2018 15:04:56 Martin Mullen ([id number]) -------------------------------------------------------------------------------- Multi Wound Chart Details Patient Name: Martin Mullen Date of Service: 11/09/2018 2:30 PM Medical Record Number: [id number] Patient Account Number: [id number] Date of Birth/Sex: Aug 26, 1980 (38 y.o. M) Treating RN: Curtis Sites Primary Care Lavonte Palos: [name] Other Clinician: Referring Deshawn Skelley: [name] Treating Bradley Handyside/Extender: STONE III, HOYT Weeks in Treatment: 4 Vital Signs Height(in): 70 Pulse(bpm): 98 Weight(lbs): 230 Blood Pressure(mmHg): 131/57 Body Mass Index(BMI): 33 Temperature(F): 97.9 Respiratory Rate 16 (breaths/min): Photos: [N/A:N/A] Wound Location: Left Toe Great N/A N/A Wounding Event: Gradually Appeared N/A N/A Primary Etiology: Diabetic Wound/Ulcer of the N/A N/A Lower Extremity Comorbid History: Coronary Artery Disease, N/A N/A Hypertension, Myocardial Infarction, Type II Diabetes Date Acquired: 08/30/2018 N/A N/A Weeks of  Treatment: 4 N/A N/A Wound Status: Open N/A N/A Pending Amputation on Yes N/A N/A Presentation: Measurements L x W x D 0.5x0.5x0.1 N/A N/A (cm) Area (cm) : 0.196 N/A N/A Volume (cm) : 0.02 N/A N/A % Reduction in Area: -2350.00% N/A N/A % Reduction in Volume: -300.00% N/A N/A Classification: Grade 1 N/A N/A Exudate Amount: Medium N/A N/A Exudate Type: Serous N/A N/A Exudate Color: amber N/A N/A Wound Margin: Flat and Intact N/A N/A Granulation Amount: Small (1-33%) N/A N/A Granulation Quality: Pink N/A N/A Necrotic Amount: Large (67-100%) N/A N/A Necrotic Tissue: Eschar N/A N/A Exposed Structures: Fat Layer (Subcutaneous N/A N/A Tissue) Exposed: Yes Martin Mullen (  [id number]) Fascia: No Tendon: No Muscle: No Joint: No Bone: No Epithelialization: Medium (34-66%) N/A N/A Debridement: Debridement - Excisional N/A N/A Pre-procedure 15:14 N/A N/A Verification/Time Out Taken: Pain Control: Lidocaine 4% Topical Solution N/A N/A Tissue Debrided: Callus, Subcutaneous, Slough N/A N/A Level: Skin/Subcutaneous Tissue N/A N/A Debridement Area (sq cm): 0.25 N/A N/A Instrument: Curette N/A N/A Bleeding: Minimum N/A N/A Hemostasis Achieved: Pressure N/A N/A Procedural Pain: 0 N/A N/A Post Procedural Pain: 0 N/A N/A Debridement Treatment Procedure was tolerated well N/A N/A Response: Post Debridement 0.5x0.2x0.5 N/A N/A Measurements L x W x D (cm) Post Debridement Volume: 0.039 N/A N/A (cm) Procedures Performed: Debridement N/A N/A Treatment Notes Electronic Signature(s) Signed: 11/09/2018 4:44:47 PM By: Curtis Sites Entered By: Curtis Sites on 11/09/2018 15:19:45 Martin Mullen ([id number]) -------------------------------------------------------------------------------- Multi-Disciplinary Care Plan Details Patient Name: Martin Mullen Date of Service: 11/09/2018 2:30 PM Medical Record Number: [id number] Patient Account Number: [id number] Date of Birth/Sex: July 26, 1980 (38  y.o. M) Treating RN: Curtis Sites Primary Care Sherene Plancarte: [name] Other Clinician: Referring Gumaro Brightbill: [name] Treating Jaisean Monteforte/Extender: Linwood Dibbles, HOYT Weeks in Treatment: 4 Active Inactive Nutrition Nursing Diagnoses: Potential for alteratiion in Nutrition/Potential for imbalanced nutrition Goals: Patient/caregiver verbalizes understanding of need to maintain therapeutic glucose control per primary care physician Date Initiated: 10/11/2018 Target Resolution Date: 10/25/2018 Goal Status: Active Interventions: Assess HgA1c results as ordered upon admission and as needed Notes: Orientation to the Wound Care Program Nursing Diagnoses: Knowledge deficit related to the wound healing center program Goals: Patient/caregiver will verbalize understanding of the Wound Healing Center Program Date Initiated: 10/11/2018 Target Resolution Date: 10/19/2018 Goal Status: Active Interventions: Provide education on orientation to the wound center Notes: Wound/Skin Impairment Nursing Diagnoses: Impaired tissue integrity Goals: Ulcer/skin breakdown will have a volume reduction of 30% by week 4 Date Initiated: 10/11/2018 Target Resolution Date: 10/19/2018 Goal Status: Active Interventions: Assess ulceration(s) every visit DEMARR, KANGAS ([id number]) Notes: Electronic Signature(s) Signed: 11/09/2018 4:44:47 PM By: Curtis Sites Entered By: Curtis Sites on 11/09/2018 15:14:04 Martin Mullen ([id number]) -------------------------------------------------------------------------------- Pain Assessment Details Patient Name: Martin Mullen Date of Service: 11/09/2018 2:30 PM Medical Record Number: [id number] Patient Account Number: [id number] Date of Birth/Sex: Jan 06, 1981 (38 y.o. M) Treating RN: Rodell Perna Primary Care Nariyah Osias: [name] Other Clinician: Referring Ismael Treptow: [name] Treating Karita Dralle/Extender: Linwood Dibbles, HOYT Weeks in Treatment: 4 Active  Problems Location of Pain Severity and Description of Pain Patient Has Paino No Site Locations Pain Management and Medication Current Pain Management: Electronic Signature(s) Signed: 11/09/2018 4:09:41 PM By: Rodell Perna Entered By: Rodell Perna on 11/09/2018 15:02:55 Martin Mullen ([id number]) -------------------------------------------------------------------------------- Patient/Caregiver Education Details Patient Name: Martin Mullen Date of Service: 11/09/2018 2:30 PM Medical Record Number: [id number] Patient Account Number: [id number] Date of Birth/Gender: 22-Mar-1980 (38 y.o. M) Treating RN: Curtis Sites Primary Care Physician: [name] Other Clinician: Referring Physician: [name] Treating Physician/Extender: Skeet Simmer in Treatment: 4 Education Assessment Education Provided To: Patient Education Topics Provided Wound/Skin Impairment: Handouts: Other: wound care as ordered Methods: Demonstration, Explain/Verbal Responses: State content correctly Electronic Signature(s) Signed: 11/09/2018 4:44:47 PM By: Curtis Sites Entered By: Curtis Sites on 11/09/2018 15:21:10 Martin Mullen ([id number]) -------------------------------------------------------------------------------- Wound Assessment Details Patient Name: Martin Mullen Date of Service: 11/09/2018 2:30 PM Medical Record Number: [id number] Patient Account Number: [id number] Date of Birth/Sex: [birth year] (38 y.o. M) Treating RN: Rodell Perna Primary Care Jeanette Moffatt: [name] Other Clinician: Referring Newton Frutiger: [name] Treating Hoyte Ziebell/Extender: STONE III, HOYT Weeks in Treatment: 4 Wound Status Wound Number: 1 Primary Diabetic Wound/Ulcer of the  Lower Extremity Etiology: Wound Location: Left Toe Great Wound Open Wounding Event: Gradually Appeared Status: Date Acquired: 08/30/2018 Comorbid Coronary Artery Disease, Hypertension, Weeks Of Treatment: 4 History: Myocardial  Infarction, Type II Diabetes Clustered Wound: No Pending Amputation On Presentation Photos Wound Measurements Length: (cm) 0.5 % Reduction in Width: (cm) 0.5 % Reduction in Depth: (cm) 0.1 Epithelializati Area: (cm) 0.196 Tunneling: Volume: (cm) 0.02 Undermining: Area: -2350% Volume: -300% on: Medium (34-66%) No No Wound Description Classification: Grade 1 Foul Odor Afte Wound Margin: Flat and Intact Slough/Fibrino Exudate Amount: Medium Exudate Type: Serous Exudate Color: amber r Cleansing: No No Wound Bed Granulation Amount: Small (1-33%) Exposed Structure Granulation Quality: Pink Fascia Exposed: No Necrotic Amount: Large (67-100%) Fat Layer (Subcutaneous Tissue) Exposed: Yes Necrotic Quality: Eschar Tendon Exposed: No Muscle Exposed: No Joint Exposed: No Bone Exposed: No Treatment Notes ZACH, TIETJE ([id number]) Wound #1 (Left Toe Great) Notes gentamycin cream, iodoform packing, foam, medipore tape, peg assist shoe Left Electronic Signature(s) Signed: 11/09/2018 4:09:41 PM By: Army Melia Entered By: Army Melia on 11/09/2018 15:04:09 [name] ([id number]) -------------------------------------------------------------------------------- Vitals Details Patient Name: [name] Date of Service: 11/09/2018 2:30 PM Medical Record Number: [id number] Patient Account Number: [id number] Date of Birth/Sex: December 08, 1980 (38 y.o. M) Treating RN: Army Melia Primary Care Mercy Leppla: Webb Silversmith Other Clinician: Referring Rabab Currington: Webb Silversmith Treating Lauralee Waters/Extender: STONE III, HOYT Weeks in Treatment: 4 Vital Signs Time Taken: 15:02 Temperature (F): 97.9 Height (in): 70 Pulse (bpm): 98 Weight (lbs): 230 Respiratory Rate (breaths/min): 16 Body Mass Index (BMI): 33 Blood Pressure (mmHg): 131/57 Reference Range: 80 - 120 mg / dl Electronic Signature(s) Signed: 11/09/2018 4:09:41 PM By: Army Melia Entered By: Army Melia on 11/09/2018  15:03:31

## 2018-11-12 ENCOUNTER — Other Ambulatory Visit: Payer: Self-pay | Admitting: Internal Medicine

## 2018-11-12 MED ORDER — OMEPRAZOLE 20 MG PO CPDR: 20.0000 mg | DELAYED_RELEASE_CAPSULE | Freq: Every day | ORAL | 3 refills | Status: DC

## 2018-11-12 NOTE — Telephone Encounter (Signed)
Will send in Omeprazole.

## 2018-11-16 ENCOUNTER — Other Ambulatory Visit: Payer: Self-pay

## 2018-11-16 ENCOUNTER — Encounter: Payer: BC Managed Care – PPO | Attending: Physician Assistant | Admitting: Physician Assistant

## 2018-11-16 DIAGNOSIS — F17200 Nicotine dependence, unspecified, uncomplicated: Secondary | ICD-10-CM | POA: Diagnosis not present

## 2018-11-16 DIAGNOSIS — I10 Essential (primary) hypertension: Secondary | ICD-10-CM | POA: Diagnosis not present

## 2018-11-16 DIAGNOSIS — I25119 Atherosclerotic heart disease of native coronary artery with unspecified angina pectoris: Secondary | ICD-10-CM | POA: Diagnosis not present

## 2018-11-16 DIAGNOSIS — I2510 Atherosclerotic heart disease of native coronary artery without angina pectoris: Secondary | ICD-10-CM | POA: Diagnosis not present

## 2018-11-16 DIAGNOSIS — E11621 Type 2 diabetes mellitus with foot ulcer: Secondary | ICD-10-CM | POA: Diagnosis not present

## 2018-11-16 DIAGNOSIS — L03032 Cellulitis of left toe: Secondary | ICD-10-CM | POA: Insufficient documentation

## 2018-11-16 DIAGNOSIS — I252 Old myocardial infarction: Secondary | ICD-10-CM | POA: Diagnosis not present

## 2018-11-16 DIAGNOSIS — L97522 Non-pressure chronic ulcer of other part of left foot with fat layer exposed: Secondary | ICD-10-CM | POA: Insufficient documentation

## 2018-11-16 NOTE — Telephone Encounter (Signed)
PA has been submitted via [email or web address] and awaiting response 

## 2018-11-16 NOTE — Progress Notes (Signed)
Martin Mullen, Martin Mullen ([id number]) Visit Report for 11/16/2018 Arrival Information Details Patient Name: Martin Mullen, Martin Mullen Date of Service: 11/16/2018 2:00 PM Medical Record Number: [id number] Patient Account Number: [id number] Date of Birth/Sex: [birth year] (38 y.o. M) Treating RN: Harold Barban Primary Care Jahquez Steffler: Webb Silversmith Other Clinician: Referring Lashona Schaaf: Webb Silversmith Treating Cian Costanzo/Extender: Melburn Hake, HOYT Weeks in Treatment: 5 Visit Information History Since Last Visit Added or deleted any medications: No Patient Arrived: Ambulatory Any new allergies or adverse reactions: No Arrival Time: 14:06 Had a fall or experienced change in No Accompanied By: self activities of daily living that may affect Transfer Assistance: None risk of falls: Patient Identification Verified: Yes Signs or symptoms of abuse/neglect since last visito No Secondary Verification Process Completed: Yes Hospitalized since last visit: No Has Dressing in Place as Prescribed: Yes Pain Present Now: No Electronic Signature(s) Signed: 11/16/2018 4:43:08 PM By: Harold Barban Entered By: Harold Barban on 11/16/2018 14:07:47 Martin Mullen ([id number]) -------------------------------------------------------------------------------- Lower Extremity Assessment Details Patient Name: Martin Mullen Date of Service: 11/16/2018 2:00 PM Medical Record Number: [id number] Patient Account Number: [id number] Date of Birth/Sex: 09/30/1980 (38 y.o. M) Treating RN: Harold Barban Primary Care Xeng Kucher: Webb Silversmith Other Clinician: Referring Jannine Abreu: Webb Silversmith Treating Eyanna Mcgonagle/Extender: STONE III, HOYT Weeks in Treatment: 5 Vascular Assessment Pulses: Dorsalis Pedis Palpable: [Left:Yes] Posterior Tibial Palpable: [Left:Yes] Electronic Signature(s) Signed: 11/16/2018 4:43:08 PM By: Harold Barban Entered By: Harold Barban on 11/16/2018 14:10:38 Martin Mullen  ([id number]) -------------------------------------------------------------------------------- Multi Wound Chart Details Patient Name: Martin Mullen Date of Service: 11/16/2018 2:00 PM Medical Record Number: [id number] Patient Account Number: [id number] Date of Birth/Sex: Sep 12, 1980 (38 y.o. M) Treating RN: Montey Hora Primary Care Field Staniszewski: Webb Silversmith Other Clinician: Referring Troye Hiemstra: Webb Silversmith Treating Racquel Arkin/Extender: STONE III, HOYT Weeks in Treatment: 5 Vital Signs Height(in): 70 Pulse(bpm): 82 Weight(lbs): 230 Blood Pressure(mmHg): 123/74 Body Mass Index(BMI): 33 Temperature(F): 97.8 Respiratory Rate 16 (breaths/min): Photos: [N/A:N/A] Wound Location: Left Toe Great N/A N/A Wounding Event: Gradually Appeared N/A N/A Primary Etiology: Diabetic Wound/Ulcer of the N/A N/A Lower Extremity Comorbid History: Coronary Artery Disease, N/A N/A Hypertension, Myocardial Infarction, Type II Diabetes Date Acquired: 08/30/2018 N/A N/A Weeks of Treatment: 5 N/A N/A Wound Status: Open N/A N/A Pending Amputation on Yes N/A N/A Presentation: Measurements L x W x D 0.3x0.3x0.5 N/A N/A (cm) Area (cm) : 0.071 N/A N/A Volume (cm) : 0.035 N/A N/A % Reduction in Area: -787.50% N/A N/A % Reduction in Volume: -600.00% N/A N/A Classification: Grade 1 N/A N/A Exudate Amount: Medium N/A N/A Exudate Type: Serous N/A N/A Exudate Color: amber N/A N/A Wound Margin: Flat and Intact N/A N/A Granulation Amount: Small (1-33%) N/A N/A Granulation Quality: Pink N/A N/A Necrotic Amount: Large (67-100%) N/A N/A Necrotic Tissue: Eschar N/A N/A Exposed Structures: Fat Layer (Subcutaneous N/A N/A Tissue) Exposed: Yes Martin Mullen, Martin Mullen ([id number]) Fascia: No Tendon: No Muscle: No Joint: No Bone: No Epithelialization: Medium (34-66%) N/A N/A Treatment Notes Electronic Signature(s) Signed: 11/16/2018 4:39:44 PM By: Montey Hora Entered By: Montey Hora on 11/16/2018  14:28:21 Martin Mullen ([id number]) -------------------------------------------------------------------------------- Multi-Disciplinary Care Plan Details Patient Name: Martin Mullen Date of Service: 11/16/2018 2:00 PM Medical Record Number: [id number] Patient Account Number: [id number] Date of Birth/Sex: 04-Sep-1980 (38 y.o. M) Treating RN: Montey Hora Primary Care Decorian Schuenemann: Webb Silversmith Other Clinician: Referring Arijana Narayan: Webb Silversmith Treating Emorie Mcfate/Extender: Melburn Hake, HOYT Weeks in Treatment: 5 Active Inactive Nutrition Nursing Diagnoses: Potential for alteratiion in Nutrition/Potential for imbalanced nutrition Goals: Patient/caregiver verbalizes understanding of need to maintain therapeutic glucose control per primary care physician Date  Initiated: 10/11/2018 Target Resolution Date: 10/25/2018 Goal Status: Active Interventions: Assess HgA1c results as ordered upon admission and as needed Notes: Orientation to the Wound Care Program Nursing Diagnoses: Knowledge deficit related to the wound healing center program Goals: Patient/caregiver will verbalize understanding of the Wound Healing Center Program Date Initiated: 10/11/2018 Target Resolution Date: 10/19/2018 Goal Status: Active Interventions: Provide education on orientation to the wound center Notes: Wound/Skin Impairment Nursing Diagnoses: Impaired tissue integrity Goals: Ulcer/skin breakdown will have a volume reduction of 30% by week 4 Date Initiated: 10/11/2018 Target Resolution Date: 10/19/2018 Goal Status: Active Interventions: Assess ulceration(s) every visit Martin Mullen, Martin Mullen ([id number]) Notes: Electronic Signature(s) Signed: 11/16/2018 4:39:44 PM By: Curtis Sites Entered By: Curtis Sites on 11/16/2018 14:28:07 Martin Mullen ([id number]) -------------------------------------------------------------------------------- Pain Assessment Details Patient Name: Martin Mullen Date of Service: 11/16/2018  2:00 PM Medical Record Number: [id number] Patient Account Number: [id number] Date of Birth/Sex: 03-30-1980 (38 y.o. M) Treating RN: Arnette Norris Primary Care Jonni Oelkers: [name] Other Clinician: Referring Charonda Hefter: [name] Treating Konstantin Lehnen/Extender: Linwood Dibbles, HOYT Weeks in Treatment: 5 Active Problems Location of Pain Severity and Description of Pain Patient Has Paino No Site Locations Pain Management and Medication Current Pain Management: Electronic Signature(s) Signed: 11/16/2018 4:43:08 PM By: Arnette Norris Entered By: Arnette Norris on 11/16/2018 14:07:58 Martin Mullen ([id number]) -------------------------------------------------------------------------------- Patient/Caregiver Education Details Patient Name: Martin Mullen Date of Service: 11/16/2018 2:00 PM Medical Record Number: [id number] Patient Account Number: [id number] Date of Birth/Gender: 08-Mar-1980 (38 y.o. M) Treating RN: Curtis Sites Primary Care Physician: [name] Other Clinician: Referring Physician: [name] Treating Physician/Extender: Skeet Simmer in Treatment: 5 Education Assessment Education Provided To: Patient Education Topics Provided Offloading: Handouts: Other: need for TCC Methods: Explain/Verbal Responses: State content correctly Electronic Signature(s) Signed: 11/16/2018 4:39:44 PM By: Curtis Sites Entered By: Curtis Sites on 11/16/2018 15:00:51 Martin Mullen ([id number]) -------------------------------------------------------------------------------- Wound Assessment Details Patient Name: Martin Mullen Date of Service: 11/16/2018 2:00 PM Medical Record Number: [id number] Patient Account Number: [id number] Date of Birth/Sex: [birth year] (38 y.o. M) Treating RN: Curtis Sites Primary Care Patryk Conant: [name] Other Clinician: Referring Carah Barrientes: [name] Treating Tomesha Sargent/Extender: STONE III, HOYT Weeks in Treatment: 5 Wound Status Wound  Number: 1 Primary Diabetic Wound/Ulcer of the Lower Extremity Etiology: Wound Location: Left Toe Great Wound Open Wounding Event: Gradually Appeared Status: Date Acquired: 08/30/2018 Comorbid Coronary Artery Disease, Hypertension, Weeks Of Treatment: 5 History: Myocardial Infarction, Type II Diabetes Clustered Wound: No Pending Amputation On Presentation Photos Wound Measurements Length: (cm) 0.3 % Reduction in Width: (cm) 0.3 % Reduction in Depth: (cm) 0.5 Epithelializati Area: (cm) 0.071 Tunneling: Volume: (cm) 0.035 Undermining: Area: -787.5% Volume: -600% on: Medium (34-66%) No No Wound Description Classification: Grade 1 Foul Odor Afte Wound Margin: Flat and Intact Slough/Fibrino Exudate Amount: Medium Exudate Type: Serous Exudate Color: amber r Cleansing: No No Wound Bed Granulation Amount: Small (1-33%) Exposed Structure Granulation Quality: Pink Fascia Exposed: No Necrotic Amount: Large (67-100%) Fat Layer (Subcutaneous Tissue) Exposed: Yes Necrotic Quality: Eschar Tendon Exposed: No Muscle Exposed: No Joint Exposed: No Bone Exposed: No Treatment Notes Martin Mullen, Martin Mullen ([id number]) Wound #1 (Left Toe Great) Notes gentamycin cream, iodoform packing, foam, medipore tape, peg assist shoe Left Electronic Signature(s) Signed: 11/16/2018 4:39:44 PM By: Curtis Sites Entered By: Curtis Sites on 11/16/2018 14:27:57 Martin Mullen ([id number]) -------------------------------------------------------------------------------- Vitals Details Patient Name: Martin Mullen Date of Service: 11/16/2018 2:00 PM Medical Record Number: [id number] Patient Account Number: [id number] Date of Birth/Sex: 1980-12-20 (38 y.o. M) Treating RN: Arnette Norris Primary Care Espiridion Supinski: [name]  Other Clinician: Referring Anette Barra: [name] Treating Amesha Bailey/Extender: STONE III, HOYT Weeks in Treatment: 5 Vital Signs Time Taken: 14:08 Temperature (F): 97.8 Height  (in): 70 Pulse (bpm): 82 Weight (lbs): 230 Respiratory Rate (breaths/min): 16 Body Mass Index (BMI): 33 Blood Pressure (mmHg): 123/74 Reference Range: 80 - 120 mg / dl Electronic Signature(s) Signed: 11/16/2018 4:43:08 PM By: Arnette Norris Entered By: Arnette Norris on 11/16/2018 14:13:37

## 2018-11-16 NOTE — Progress Notes (Addendum)
SAABIR, BLYTH ([id number]) Visit Report for 11/16/2018 Chief Complaint Document Details Patient Name: Martin Mullen, Martin Mullen Date of Service: 11/16/2018 2:00 PM Medical Record Number: [id number] Patient Account Number: [id number] Date of Birth/Sex: 10-13-1980 (38 y.o. M) Treating RN: Curtis Sites Primary Care Provider: [name] Other Clinician: Referring Provider: [name] Treating Provider/Extender: Linwood Dibbles, HOYT Weeks in Treatment: 5 Information Obtained from: Patient Chief Complaint left great toe ulcer Electronic Signature(s) Signed: 11/16/2018 2:11:30 PM By: Lenda Kelp PA-C Entered By: Lenda Kelp on 11/16/2018 14:11:30 Martin Mullen ([id number]) -------------------------------------------------------------------------------- Debridement Details Patient Name: Martin Mullen Date of Service: 11/16/2018 2:00 PM Medical Record Number: [id number] Patient Account Number: [id number] Date of Birth/Sex: [birth year] (38 y.o. M) Treating RN: Curtis Sites Primary Care Provider: [name] Other Clinician: Referring Provider: [name] Treating Provider/Extender: Linwood Dibbles, HOYT Weeks in Treatment: 5 Debridement Performed for Wound #1 Left Toe Great Assessment: Performed By: Physician STONE III, HOYT E., PA-C Debridement Type: Debridement Severity of Tissue Pre Fat layer exposed Debridement: Level of Consciousness (Pre- Awake and Alert procedure): Pre-procedure Verification/Time Yes - 14:27 Out Taken: Start Time: 14:27 Pain Control: Lidocaine 4% Topical Solution Total Area Debrided (L x W): 0.3 (cm) x 0.3 (cm) = 0.09 (cm) Tissue and other material Viable, Non-Viable, Callus, Slough, Subcutaneous, Slough debrided: Level: Skin/Subcutaneous Tissue Debridement Description: Excisional Instrument: Curette Bleeding: Minimum Hemostasis Achieved: Pressure End Time: 14:31 Procedural Pain: 0 Post Procedural Pain: 0 Response to Treatment: Procedure was tolerated  well Level of Consciousness Awake and Alert (Post-procedure): Post Debridement Measurements of Total Wound Length: (cm) 0.4 Width: (cm) 0.4 Depth: (cm) 0.4 Volume: (cm) 0.05 Character of Wound/Ulcer Post Debridement: Improved Severity of Tissue Post Debridement: Fat layer exposed Post Procedure Diagnosis Same as Pre-procedure Electronic Signature(s) Signed: 11/16/2018 4:32:36 PM By: Lenda Kelp PA-C Signed: 11/16/2018 4:39:44 PM By: Curtis Sites Entered By: Curtis Sites on 11/16/2018 14:31:44 Martin Mullen ([id number]) -------------------------------------------------------------------------------- HPI Details Patient Name: Martin Mullen Date of Service: 11/16/2018 2:00 PM Medical Record Number: [id number] Patient Account Number: [id number] Date of Birth/Sex: 1980-11-05 (38 y.o. M) Treating RN: Curtis Sites Primary Care Provider: [name] Other Clinician: Referring Provider: [name] Treating Provider/Extender: Linwood Dibbles, HOYT Weeks in Treatment: 5 History of Present Illness HPI Description: 10/11/18 patient presents today for initial evaluation our clinic. He is being seen due to a left great toe ulcer which unfortunately has been present for quite some time. Initially he thought it really wasn't much of anything and then subsequently noted that he had some drainage then that seem to dry off and he didn't worry too much about it until more recently when he is had issues. 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 Subsequently he tells me he has not utilized any recreational drugs for two years. This is obviously excellent news. He also does have diabetes type II. It does sound like he is trying to improve things as far as his overall health status is concerned although his hemoglobin A-1 C as measured on 09/27/18 was 10.7. He did have an x-ray as well  on 09/28/18 and this was negative for any evidence of acute or chronic osteomyelitis or fracture. Patient's wound is not appear to be severely infected at this time which is good news. With that being said he also doesn't really have any pain this is secondary to neuropathy. No fevers, chills, nausea, or vomiting noted at this  time. 10/18/2018 on evaluation today patient actually appears to be doing better with regard to his toe ulcer. He has been tolerating the dressing changes without complication. Fortunately there is no signs of active infection at this time. Overall very pleased in this regard and how things are doing. Patient likewise is pleased and happy to see that things are doing much better. Still he does have some ways to go to get this area to completely close. 10/30/2018 on evaluation today patient's ulcer on his plantar foot actually appears to be doing quite well at this time. He has been tolerating the dressing changes without complication which is excellent news. With that being said I do believe he is making progress there is some callus buildup on without the clear away today but other than that I am seeing evidence that this seems to be filling in from the base and is getting much smaller in the base. Still were not at the point of complete closure yet. 11/09/2018 on evaluation today patient actually appears to be doing quite well with regard to his toe ulcer from the standpoint of infection. I do not see any signs of active infection at this time which is good news. With that being said he unfortunately is not really making much progress either he continues to have depth of the wound despite the fact that it seems to be callused over initially on inspection when I probed and actually remove some of the callus it is much deeper. Fortunately there is no signs of active infection at this time. 11/16/2018 on evaluation today patient's toe ulcer actually appears to be doing about the  same. There is really no significant improvement compared to last time I saw him. There is some debridement necessary today he has callus covering the wound area I think he may be a good candidate for total contact cast to rather rapidly get this area to close. We previously have discussed this although we have not initiated it yet I think it may be time to do so and go ahead and get this closed. Electronic Signature(s) Signed: 11/16/2018 3:12:14 PM By: Lenda KelpStone III, Hoyt PA-C Entered By: Lenda KelpStone III, Hoyt on 11/16/2018 15:12:13 Martin JeffersonOLIVER, Jadier ([id number]) -------------------------------------------------------------------------------- Physical Exam Details Patient Name: Martin JeffersonLIVER, Agastya Date of Service: 11/16/2018 2:00 PM Medical Record Number: [id number] Patient Account Number: [id number] Date of Birth/Sex: April 01, 1980 (38 y.o. M) Treating RN: Curtis Sitesorthy, Joanna Primary Care Provider: Nicki ReaperBAITY, REGINA Other Clinician: Referring Provider: Nicki ReaperBAITY, REGINA Treating Provider/Extender: STONE III, HOYT Weeks in Treatment: 5 Constitutional Well-nourished and well-hydrated in no acute distress. Respiratory normal breathing without difficulty. clear to auscultation bilaterally. Cardiovascular regular rate and rhythm with normal S1, S2. Psychiatric this patient is able to make decisions and demonstrates good insight into disease process. Alert and Oriented x 3. pleasant and cooperative. Notes On inspection today patient's wound bed did have slough noted on the surface of the wound as well as some callus. This did require sharp debridement and I was able to remove the necrotic tissue as well as the rolled edges from around the edge of the wound. The patient tolerated this today without complication post debridement wound bed appears to be doing much better which is excellent news. Overall I am very pleased at this point with how things stand. I do think that the patient would benefit from a total contact  cast. Electronic Signature(s) Signed: 11/16/2018 3:12:49 PM By: Lenda KelpStone III, Hoyt PA-C Entered By: Lenda KelpStone III, Hoyt on 11/16/2018 15:12:48 Martin JeffersonLIVER, Damonie ([id number]) --------------------------------------------------------------------------------  Physician Orders Details Patient Name: GREYSON, PEAVY Date of Service: 11/16/2018 2:00 PM Medical Record Number: [id number] Patient Account Number: [id number] Date of Birth/Sex: 1980/07/12 (38 y.o. M) Treating RN: Montey Hora Primary Care Provider: Webb Silversmith Other Clinician: Referring Provider: Webb Silversmith Treating Provider/Extender: Melburn Hake, HOYT Weeks in Treatment: 5 Verbal / Phone Orders: No Diagnosis Coding ICD-10 Coding Code Description E11.621 Type 2 diabetes mellitus with foot ulcer L97.522 Non-pressure chronic ulcer of other part of left foot with fat layer exposed I10 Essential (primary) hypertension I25.10 Atherosclerotic heart disease of native coronary artery without angina pectoris Wound Cleansing Wound #1 Left Toe Great o Clean wound with Normal Saline. - in office o Dial antibacterial soap, wash wounds, rinse and pat dry prior to dressing wounds Primary Wound Dressing Wound #1 Left Toe Great o Gentamicin Sulfate Cream o Iodoform packing Gauze - 1/4 inch (cut length in half if needed) Secondary Dressing Wound #1 Left Toe Great o Foam o Other - secure with tape Dressing Change Frequency Wound #1 Left Toe Great o Change dressing every other day. Follow-up Appointments Wound #1 Left Toe Great o Return Appointment in 1 week. - Tuesday for cast Off-Loading Wound #1 Left Toe Great o Open toe surgical shoe with peg assist. Electronic Signature(s) Signed: 11/16/2018 4:32:36 PM By: Worthy Keeler PA-C Signed: 11/16/2018 4:39:44 PM By: Montey Hora Entered By: Montey Hora on 11/16/2018 14:33:08 ZAK, GONDEK ([id number]) WENCESLAO, LOPER  ([id number]) -------------------------------------------------------------------------------- Problem List Details Patient Name: [name] Date of Service: 11/16/2018 2:00 PM Medical Record Number: [id number] Patient Account Number: [id number] Date of Birth/Sex: 1980/07/15 (38 y.o. M) Treating RN: Montey Hora Primary Care Provider: Webb Silversmith Other Clinician: Referring Provider: Webb Silversmith Treating Provider/Extender: Melburn Hake, HOYT Weeks in Treatment: 5 Active Problems ICD-10 Evaluated Encounter Code Description Active Date Today Diagnosis E11.621 Type 2 diabetes mellitus with foot ulcer 10/11/2018 No Yes L97.522 Non-pressure chronic ulcer of other part of left foot with fat 10/11/2018 No Yes layer exposed I10 Essential (primary) hypertension 10/11/2018 No Yes I25.10 Atherosclerotic heart disease of native coronary artery 10/11/2018 No Yes without angina pectoris Inactive Problems Resolved Problems Electronic Signature(s) Signed: 11/16/2018 2:11:24 PM By: Worthy Keeler PA-C Entered By: Worthy Keeler on 11/16/2018 14:11:23 [name] ([id number]) -------------------------------------------------------------------------------- Progress Note Details Patient Name: [name] Date of Service: 11/16/2018 2:00 PM Medical Record Number: [id number] Patient Account Number: [id number] Date of Birth/Sex: 08-12-1980 (38 y.o. M) Treating RN: Montey Hora Primary Care Provider: Webb Silversmith Other Clinician: Referring Provider: Webb Silversmith Treating Provider/Extender: Melburn Hake, HOYT Weeks in Treatment: 5 Subjective Chief Complaint Information obtained from Patient left great toe ulcer History of Present Illness (HPI) 10/11/18 patient presents today for initial evaluation our clinic. He is being seen due to a left great toe ulcer which unfortunately has been present for quite some time. Initially he thought it really wasn't much of anything and then subsequently noted  that he had some drainage then that seem to dry off and he didn't worry too much about it until more recently when he is had issues. 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 Subsequently he tells me he has not utilized any recreational drugs for two years. This is obviously excellent news. He also does have diabetes type II. It does sound like he is trying to improve things as far as his overall health status is concerned although his hemoglobin  A-1 C as measured on 09/27/18 was 10.7. He did have an x-ray as well on 09/28/18 and this was negative for any evidence of acute or chronic osteomyelitis or fracture. Patient's wound is not appear to be severely infected at this time which is good news. With that being said he also doesn't really have any pain this is secondary to neuropathy. No fevers, chills, nausea, or vomiting noted at this time. 10/18/2018 on evaluation today patient actually appears to be doing better with regard to his toe ulcer. He has been tolerating the dressing changes without complication. Fortunately there is no signs of active infection at this time. Overall very pleased in this regard and how things are doing. Patient likewise is pleased and happy to see that things are doing much better. Still he does have some ways to go to get this area to completely close. 10/30/2018 on evaluation today patient's ulcer on his plantar foot actually appears to be doing quite well at this time. He has been tolerating the dressing changes without complication which is excellent news. With that being said I do believe he is making progress there is some callus buildup on without the clear away today but other than that I am seeing evidence that this seems to be filling in from the base and is getting much smaller in the base. Still were not at the point of complete  closure yet. 11/09/2018 on evaluation today patient actually appears to be doing quite well with regard to his toe ulcer from the standpoint of infection. I do not see any signs of active infection at this time which is good news. With that being said he unfortunately is not really making much progress either he continues to have depth of the wound despite the fact that it seems to be callused over initially on inspection when I probed and actually remove some of the callus it is much deeper. Fortunately there is no signs of active infection at this time. 11/16/2018 on evaluation today patient's toe ulcer actually appears to be doing about the same. There is really no significant improvement compared to last time I saw him. There is some debridement necessary today he has callus covering the wound area I think he may be a good candidate for total contact cast to rather rapidly get this area to close. We previously have discussed this although we have not initiated it yet I think it may be time to do so and go ahead and get this closed. Patient History Information obtained from Patient. Family History Diabetes - Mother,Father,Paternal Grandparents,Maternal Grandparents, No family history of Cancer, Heart Disease, Hereditary Spherocytosis, Hypertension, Kidney Disease, Lung Disease, Seizures, Stroke, Thyroid Problems, Tuberculosis. SCOUT, GUMBS ([id number]) Social History Current every day smoker, Marital Status - Single, Alcohol Use - Rarely, Drug Use - Prior History, Caffeine Use - Daily. Medical History Cardiovascular Patient has history of Coronary Artery Disease, Hypertension, Myocardial Infarction Denies history of Angina, Arrhythmia, Congestive Heart Failure, Deep Vein Thrombosis, Hypotension, Peripheral Arterial Disease, Peripheral Venous Disease, Phlebitis, Vasculitis Endocrine Patient has history of Type II Diabetes Denies history of Type I Diabetes Integumentary (Skin) Denies  history of History of Burn, History of pressure wounds Medical And Surgical History Notes Cardiovascular HLD Review of Systems (ROS) Constitutional Symptoms (General Health) Denies complaints or symptoms of Fatigue, Fever, Chills, Marked Weight Change. Respiratory Denies complaints or symptoms of Chronic or frequent coughs, Shortness of Breath. Cardiovascular Denies complaints or symptoms of Chest pain, LE edema. Psychiatric Denies complaints or  symptoms of Anxiety, Claustrophobia. Objective Constitutional Well-nourished and well-hydrated in no acute distress. Vitals Time Taken: 2:08 PM, Height: 70 in, Weight: 230 lbs, BMI: 33, Temperature: 97.8 F, Pulse: 82 bpm, Respiratory Rate: 16 breaths/min, Blood Pressure: 123/74 mmHg. Respiratory normal breathing without difficulty. clear to auscultation bilaterally. Cardiovascular regular rate and rhythm with normal S1, S2. Psychiatric this patient is able to make decisions and demonstrates good insight into disease process. Alert and Oriented x 3. pleasant and cooperative. General Notes: On inspection today patient's wound bed did have slough noted on the surface of the wound as well as some callus. This did require sharp debridement and I was able to remove the necrotic tissue as well as the rolled edges from [place], Rishik ([id number]) around the edge of the wound. The patient tolerated this today without complication post debridement wound bed appears to be doing much better which is excellent news. Overall I am very pleased at this point with how things stand. I do think that the patient would benefit from a total contact cast. Integumentary (Hair, Skin) Wound #1 status is Open. Original cause of wound was Gradually Appeared. The wound is located on the Left Toe Great. The wound measures 0.3cm length x 0.3cm width x 0.5cm depth; 0.071cm^2 area and 0.035cm^3 volume. There is Fat Layer (Subcutaneous Tissue) Exposed exposed. There is no  tunneling or undermining noted. There is a medium amount of serous drainage noted. The wound margin is flat and intact. There is small (1-33%) pink granulation within the wound bed. There is a large (67-100%) amount of necrotic tissue within the wound bed including Eschar. Assessment Active Problems ICD-10 Type 2 diabetes mellitus with foot ulcer Non-pressure chronic ulcer of other part of left foot with fat layer exposed Essential (primary) hypertension Atherosclerotic heart disease of native coronary artery without angina pectoris Procedures Wound #1 Pre-procedure diagnosis of Wound #1 is a Diabetic Wound/Ulcer of the Lower Extremity located on the Left Toe Great .Severity of Tissue Pre Debridement is: Fat layer exposed. There was a Excisional Skin/Subcutaneous Tissue Debridement with a total area of 0.09 sq cm performed by STONE III, HOYT E., PA-C. With the following instrument(s): Curette to remove Viable and Non-Viable tissue/material. Material removed includes Callus, Subcutaneous Tissue, and Slough after achieving pain control using Lidocaine 4% Topical Solution. No specimens were taken. A time out was conducted at 14:27, prior to the start of the procedure. A Minimum amount of bleeding was controlled with Pressure. The procedure was tolerated well with a pain level of 0 throughout and a pain level of 0 following the procedure. Post Debridement Measurements: 0.4cm length x 0.4cm width x 0.4cm depth; 0.05cm^3 volume. Character of Wound/Ulcer Post Debridement is improved. Severity of Tissue Post Debridement is: Fat layer exposed. Post procedure Diagnosis Wound #1: Same as Pre-Procedure Plan Wound Cleansing: Wound #1 Left Toe Great: Clean wound with Normal Saline. - in office Dial antibacterial soap, wash wounds, rinse and pat dry prior to dressing wounds Primary Wound Dressing: Wound #1 Left Toe Great: Gentamicin Sulfate Cream Iodoform packing Gauze - 1/4 inch (cut length in half  if needed) HUN, Ebbie Ridge ([id number]) Secondary Dressing: Wound #1 Left Toe Great: Foam Other - secure with tape Dressing Change Frequency: Wound #1 Left Toe Great: Change dressing every other day. Follow-up Appointments: Wound #1 Left Toe Great: Return Appointment in 1 week. - Tuesday for cast Off-Loading: Wound #1 Left Toe Great: Open toe surgical shoe with peg assist. 1 for the time being we will get a  continue with the current wound care measures which includes the gentamicin with packing gauze and behind this. That seems to be doing well at trying to keep the area a little bit more open although as the callus forms he is not able to actually pack the area. 2. I am also going to suggest that we continue to use foam to try to offload this. This I think is the best thing to do for the time being. 3. I am going to suggest at this point that we go ahead and get the patient set up for an appointment on Tuesday and at that time we will get a go ahead and initiate the total contact cast for him. I think that this would be the most appropriate way to initiate the best offloading to try to get this area to heal as soon as possible. The patient is in agreement with this that we will be able to put it on Tuesday change it out on Thursday and then if everything is doing well he will just go week to week from that point. We will see patient back for reevaluation in 1 week here in the clinic. If anything worsens or changes patient will contact our office for additional recommendations. Electronic Signature(s) Signed: 11/16/2018 3:14:00 PM By: Lenda KelpStone III, Hoyt PA-C Entered By: Lenda KelpStone III, Hoyt on 11/16/2018 15:14:00 Martin JeffersonOLIVER, Reynol ([id number]) -------------------------------------------------------------------------------- ROS/PFSH Details Patient Name: Martin JeffersonLIVER, Calahan Date of Service: 11/16/2018 2:00 PM Medical Record Number: [id number] Patient Account Number: [id number] Date of Birth/Sex:  May 16, 1980 (38 y.o. M) Treating RN: Curtis Sitesorthy, Joanna Primary Care Provider: Nicki ReaperBAITY, REGINA Other Clinician: Referring Provider: Nicki ReaperBAITY, REGINA Treating Provider/Extender: Linwood DibblesSTONE III, HOYT Weeks in Treatment: 5 Information Obtained From Patient Constitutional Symptoms (General Health) Complaints and Symptoms: Negative for: Fatigue; Fever; Chills; Marked Weight Change Respiratory Complaints and Symptoms: Negative for: Chronic or frequent coughs; Shortness of Breath Cardiovascular Complaints and Symptoms: Negative for: Chest pain; LE edema Medical History: Positive for: Coronary Artery Disease; Hypertension; Myocardial Infarction Negative for: Angina; Arrhythmia; Congestive Heart Failure; Deep Vein Thrombosis; Hypotension; Peripheral Arterial Disease; Peripheral Venous Disease; Phlebitis; Vasculitis Past Medical History Notes: HLD Psychiatric Complaints and Symptoms: Negative for: Anxiety; Claustrophobia Endocrine Medical History: Positive for: Type II Diabetes Negative for: Type I Diabetes Treated with: Oral agents Integumentary (Skin) Medical History: Negative for: History of Burn; History of pressure wounds Immunizations Pneumococcal Vaccine: Received Pneumococcal Vaccination: Yes Implantable Devices None Martin JeffersonOLIVER, Johann ([id number]) Family and Social History Cancer: No; Diabetes: Yes - Mother,Father,Paternal Grandparents,Maternal Grandparents; Heart Disease: No; Hereditary Spherocytosis: No; Hypertension: No; Kidney Disease: No; Lung Disease: No; Seizures: No; Stroke: No; Thyroid Problems: No; Tuberculosis: No; Current every day smoker; Marital Status - Single; Alcohol Use: Rarely; Drug Use: Prior History; Caffeine Use: Daily; Financial Concerns: No; Food, Clothing or Shelter Needs: No; Support System Lacking: No; Transportation Concerns: No Physician Affirmation I have reviewed and agree with the above information. Electronic Signature(s) Signed: 11/16/2018 4:32:36 PM By:  Lenda KelpStone III, Hoyt PA-C Signed: 11/16/2018 4:39:44 PM By: Curtis Sitesorthy, Joanna Entered By: Lenda KelpStone III, Hoyt on 11/16/2018 15:12:30 Martin JeffersonOLIVER, Michele ([id number]) -------------------------------------------------------------------------------- SuperBill Details Patient Name: Martin JeffersonLIVER, Daveon Date of Service: 11/16/2018 Medical Record Number: [id number] Patient Account Number: [id number] Date of Birth/Sex: May 16, 1980 (38 y.o. M) Treating RN: Curtis Sitesorthy, Joanna Primary Care Provider: Nicki ReaperBAITY, REGINA Other Clinician: Referring Provider: Nicki ReaperBAITY, REGINA Treating Provider/Extender: Linwood DibblesSTONE III, HOYT Weeks in Treatment: 5 Diagnosis Coding ICD-10 Codes Code Description E11.621 Type 2 diabetes mellitus with foot ulcer L97.522 Non-pressure chronic ulcer of other part of left foot  with fat layer exposed I10 Essential (primary) hypertension I25.10 Atherosclerotic heart disease of native coronary artery without angina pectoris Facility Procedures CPT4 Code: [id number] Description: 11042 - DEB SUBQ TISSUE 20 SQ CM/< ICD-10 Diagnosis Description L97.522 Non-pressure chronic ulcer of other part of left foot with fat Modifier: layer exposed Quantity: 1 Physician Procedures CPT4 Code: [id number] Description: 11042 - WC PHYS SUBQ TISS 20 SQ CM ICD-10 Diagnosis Description L97.522 Non-pressure chronic ulcer of other part of left foot with fat Modifier: layer exposed Quantity: 1 Electronic Signature(s) Signed: 11/16/2018 3:14:17 PM By: Lenda Kelp PA-C Entered By: Lenda Kelp on 11/16/2018 15:14:17

## 2018-11-20 ENCOUNTER — Ambulatory Visit: Payer: BC Managed Care – PPO | Admitting: Physician Assistant

## 2018-11-22 ENCOUNTER — Telehealth: Payer: Self-pay | Admitting: Internal Medicine

## 2018-11-22 ENCOUNTER — Encounter: Payer: BC Managed Care – PPO | Admitting: Physician Assistant

## 2018-11-22 ENCOUNTER — Other Ambulatory Visit: Payer: Self-pay

## 2018-11-22 DIAGNOSIS — L03032 Cellulitis of left toe: Secondary | ICD-10-CM | POA: Diagnosis not present

## 2018-11-22 DIAGNOSIS — I252 Old myocardial infarction: Secondary | ICD-10-CM | POA: Diagnosis not present

## 2018-11-22 DIAGNOSIS — F17200 Nicotine dependence, unspecified, uncomplicated: Secondary | ICD-10-CM | POA: Diagnosis not present

## 2018-11-22 DIAGNOSIS — L97522 Non-pressure chronic ulcer of other part of left foot with fat layer exposed: Secondary | ICD-10-CM | POA: Diagnosis not present

## 2018-11-22 DIAGNOSIS — I2510 Atherosclerotic heart disease of native coronary artery without angina pectoris: Secondary | ICD-10-CM | POA: Diagnosis not present

## 2018-11-22 DIAGNOSIS — E11621 Type 2 diabetes mellitus with foot ulcer: Secondary | ICD-10-CM | POA: Diagnosis not present

## 2018-11-22 DIAGNOSIS — I25119 Atherosclerotic heart disease of native coronary artery with unspecified angina pectoris: Secondary | ICD-10-CM | POA: Diagnosis not present

## 2018-11-22 DIAGNOSIS — I10 Essential (primary) hypertension: Secondary | ICD-10-CM | POA: Diagnosis not present

## 2018-11-22 MED ORDER — LANTUS SOLOSTAR 100 UNIT/ML ~~LOC~~ SOPN: 23.0000 [IU] | PEN_INJECTOR | Freq: Every day | SUBCUTANEOUS | 1 refills | Status: DC

## 2018-11-22 NOTE — Telephone Encounter (Signed)
Change to Lantus, same dose

## 2018-11-22 NOTE — Progress Notes (Addendum)
Martin JeffersonOLIVER, Jaquese ([id number]) Visit Report for 11/22/2018 Chief Complaint Document Details Patient Name: Martin JeffersonOLIVER, Cornelis Date of Service: 11/22/2018 3:00 PM Medical Record Number: [id number] Patient Account Number: [id number] Date of Birth/Sex: 1980-09-13 (38 y.o. M) Treating RN: Rodell PernaScott, Dajea Primary Care Provider: Nicki ReaperBAITY, REGINA Other Clinician: Referring Provider: Nicki ReaperBAITY, REGINA Treating Provider/Extender: Linwood DibblesSTONE III, HOYT Weeks in Treatment: 6 Information Obtained from: Patient Chief Complaint left great toe ulcer Electronic Signature(s) Signed: 11/22/2018 3:09:01 PM By: Lenda KelpStone III, Hoyt PA-C Entered By: Lenda KelpStone III, Hoyt on 11/22/2018 15:09:01 Martin JeffersonOLIVER, Kailo ([id number]) -------------------------------------------------------------------------------- Debridement Details Patient Name: Martin JeffersonLIVER, Coleman Date of Service: 11/22/2018 3:00 PM Medical Record Number: [id number] Patient Account Number: [id number] Date of Birth/Sex: 1980-09-13 (38 y.o. M) Treating RN: Rodell PernaScott, Dajea Primary Care Provider: Nicki ReaperBAITY, REGINA Other Clinician: Referring Provider: Nicki ReaperBAITY, REGINA Treating Provider/Extender: Linwood DibblesSTONE III, HOYT Weeks in Treatment: 6 Debridement Performed for Wound #1 Left Toe Great Assessment: Performed By: Physician STONE III, HOYT E., PA-C Debridement Type: Debridement Severity of Tissue Pre Fat layer exposed Debridement: Level of Consciousness (Pre- Awake and Alert procedure): Pre-procedure Verification/Time Yes - 15:26 Out Taken: Start Time: 15:27 Pain Control: Lidocaine Total Area Debrided (L x W): 0.1 (cm) x 0.2 (cm) = 0.02 (cm) Tissue and other material Viable, Non-Viable, Callus debrided: Level: Non-Viable Tissue Debridement Description: Selective/Open Wound Instrument: Curette Bleeding: None End Time: 15:29 Response to Treatment: Procedure was tolerated well Level of Consciousness Awake and Alert (Post-procedure): Post Debridement Measurements of Total Wound Length: (cm)  0.3 Width: (cm) 0.2 Depth: (cm) 0.3 Volume: (cm) 0.014 Character of Wound/Ulcer Post Debridement: Stable Severity of Tissue Post Debridement: Fat layer exposed Post Procedure Diagnosis Same as Pre-procedure Electronic Signature(s) Signed: 11/22/2018 5:11:53 PM By: Lenda KelpStone III, Hoyt PA-C Signed: 12/03/2018 8:11:06 AM By: Rodell PernaScott, Dajea Entered By: Rodell PernaScott, Dajea on 11/22/2018 15:30:16 Martin JeffersonLIVER, Marian ([id number]) -------------------------------------------------------------------------------- HPI Details Patient Name: Martin JeffersonLIVER, Douglass Date of Service: 11/22/2018 3:00 PM Medical Record Number: [id number] Patient Account Number: [id number] Date of Birth/Sex: 1980-09-13 (38 y.o. M) Treating RN: Rodell PernaScott, Dajea Primary Care Provider: Nicki ReaperBAITY, REGINA Other Clinician: Referring Provider: Nicki ReaperBAITY, REGINA Treating Provider/Extender: Linwood DibblesSTONE III, HOYT Weeks in Treatment: 6 History of Present Illness HPI Description: 10/11/18 patient presents today for initial evaluation our clinic. He is being seen due to a left great toe ulcer which unfortunately has been present for quite some time. Initially he thought it really wasn't much of anything and then subsequently noted that he had some drainage then that seem to dry off and he didn't worry too much about it until more recently when he is had issues. 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 Subsequently he tells me he has not utilized any recreational drugs for two years. This is obviously excellent news. He also does have diabetes type II. It does sound like he is trying to improve things as far as his overall health status is concerned although his hemoglobin A-1 C as measured on 09/27/18 was 10.7. He did have an x-ray as well on 09/28/18 and this was negative for any evidence of acute or chronic osteomyelitis or fracture. Patient's wound is not appear to  be severely infected at this time which is good news. With that being said he also doesn't really have any pain this is secondary to neuropathy. No fevers, chills, nausea, or vomiting noted at this time. 10/18/2018 on evaluation today patient actually appears to be doing better with regard to  his toe ulcer. He has been tolerating the dressing changes without complication. Fortunately there is no signs of active infection at this time. Overall very pleased in this regard and how things are doing. Patient likewise is pleased and happy to see that things are doing much better. Still he does have some ways to go to get this area to completely close. 10/30/2018 on evaluation today patient's ulcer on his plantar foot actually appears to be doing quite well at this time. He has been tolerating the dressing changes without complication which is excellent news. With that being said I do believe he is making progress there is some callus buildup on without the clear away today but other than that I am seeing evidence that this seems to be filling in from the base and is getting much smaller in the base. Still were not at the point of complete closure yet. 11/09/2018 on evaluation today patient actually appears to be doing quite well with regard to his toe ulcer from the standpoint of infection. I do not see any signs of active infection at this time which is good news. With that being said he unfortunately is not really making much progress either he continues to have depth of the wound despite the fact that it seems to be callused over initially on inspection when I probed and actually remove some of the callus it is much deeper. Fortunately there is no signs of active infection at this time. 11/16/2018 on evaluation today patient's toe ulcer actually appears to be doing about the same. There is really no significant improvement compared to last time I saw him. There is some debridement necessary today he has  callus covering the wound area I think he may be a good candidate for total contact cast to rather rapidly get this area to close. We previously have discussed this although we have not initiated it yet I think it may be time to do so and go ahead and get this closed. 11/22/2018 on evaluation today patient appears to be doing better with regard to the overall size of his wound. He has been tolerating the dressing changes without complication. Fortunately there is no signs of active infection at this time. No fevers, chills, nausea, vomiting, or diarrhea. Electronic Signature(s) Signed: 11/22/2018 3:33:10 PM By: Worthy Keeler PA-C Entered By: Worthy Keeler on 11/22/2018 15:33:10 SHAIDEN, ALDOUS ([id number]) -------------------------------------------------------------------------------- Physical Exam Details Patient Name: [name] Date of Service: 11/22/2018 3:00 PM Medical Record Number: [id number] Patient Account Number: [id number] Date of Birth/Sex: [birth year] (38 y.o. M) Treating RN: Army Melia Primary Care Provider: Webb Silversmith Other Clinician: Referring Provider: Webb Silversmith Treating Provider/Extender: STONE III, HOYT Weeks in Treatment: 6 Constitutional Well-nourished and well-hydrated in no acute distress. Respiratory normal breathing without difficulty. Psychiatric this patient is able to make decisions and demonstrates good insight into disease process. Alert and Oriented x 3. pleasant and cooperative. Notes Patient's wound bed currently showed evidence of good granulation at this time. Fortunately there does not appear to be any signs of infection and overall I am extremely pleased with how things seem to have progressed up to this point. He has been wearing the offloading shoe more readily and I can definitely see that in the amount of callus buildup or rather lack thereof as well which is also good news. I did perform sharp debridement to remove callus from around  the edge of the wound he tolerated this today without complication post debridement wound bed appears to  be doing much better. Electronic Signature(s) Signed: 11/22/2018 3:33:48 PM By: Lenda Kelp PA-C Entered By: Lenda Kelp on 11/22/2018 15:33:48 BANYAN, GOODCHILD ([id number]) -------------------------------------------------------------------------------- Physician Orders Details Patient Name: Martin Mullen Date of Service: 11/22/2018 3:00 PM Medical Record Number: [id number] Patient Account Number: [id number] Date of Birth/Sex: [birth year] (38 y.o. M) Treating RN: Rodell Perna Primary Care Provider: [name] Other Clinician: Referring Provider: [name] Treating Provider/Extender: Linwood Dibbles, HOYT Weeks in Treatment: 6 Verbal / Phone Orders: No Diagnosis Coding ICD-10 Coding Code Description E11.621 Type 2 diabetes mellitus with foot ulcer L97.522 Non-pressure chronic ulcer of other part of left foot with fat layer exposed I10 Essential (primary) hypertension I25.10 Atherosclerotic heart disease of native coronary artery without angina pectoris Wound Cleansing Wound #1 Left Toe Great o Clean wound with Normal Saline. - in office o Dial antibacterial soap, wash wounds, rinse and pat dry prior to dressing wounds Primary Wound Dressing Wound #1 Left Toe Great o Iodoflex Secondary Dressing Wound #1 Left Toe Great o Foam o Other - secure with tape Dressing Change Frequency Wound #1 Left Toe Great o Change dressing every other day. Follow-up Appointments Wound #1 Left Toe Great o Return Appointment in 1 week. Off-Loading Wound #1 Left Toe Great o Open toe surgical shoe with peg assist. Electronic Signature(s) Signed: 11/22/2018 5:11:53 PM By: Lenda Kelp PA-C Signed: 12/03/2018 8:11:06 AM By: Rodell Perna Entered By: Rodell Perna on 11/22/2018 15:31:16 LEMARIO, CHAIKIN  ([id number]) -------------------------------------------------------------------------------- Problem List Details Patient Name: Martin Mullen Date of Service: 11/22/2018 3:00 PM Medical Record Number: [id number] Patient Account Number: [id number] Date of Birth/Sex: 1980-07-02 (38 y.o. M) Treating RN: Rodell Perna Primary Care Provider: [name] Other Clinician: Referring Provider: [name] Treating Provider/Extender: Linwood Dibbles, HOYT Weeks in Treatment: 6 Active Problems ICD-10 Evaluated Encounter Code Description Active Date Today Diagnosis E11.621 Type 2 diabetes mellitus with foot ulcer 10/11/2018 No Yes L97.522 Non-pressure chronic ulcer of other part of left foot with fat 10/11/2018 No Yes layer exposed I10 Essential (primary) hypertension 10/11/2018 No Yes I25.10 Atherosclerotic heart disease of native coronary artery 10/11/2018 No Yes without angina pectoris Inactive Problems Resolved Problems Electronic Signature(s) Signed: 11/22/2018 3:08:48 PM By: Lenda Kelp PA-C Entered By: Lenda Kelp on 11/22/2018 15:08:48 Martin Mullen ([id number]) -------------------------------------------------------------------------------- Progress Note Details Patient Name: Martin Mullen Date of Service: 11/22/2018 3:00 PM Medical Record Number: [id number] Patient Account Number: [id number] Date of Birth/Sex: [birth year] (39 y.o. M) Treating RN: Rodell Perna Primary Care Provider: [name] Other Clinician: Referring Provider: [name] Treating Provider/Extender: Linwood Dibbles, HOYT Weeks in Treatment: 6 Subjective Chief Complaint Information obtained from Patient left great toe ulcer History of Present Illness (HPI) 10/11/18 patient presents today for initial evaluation our clinic. He is being seen due to a left great toe ulcer which unfortunately has been present for quite some time. Initially he thought it really wasn't much of anything and then subsequently noted  that he had some drainage then that seem to dry off and he didn't worry too much about it until more recently when he is had issues. 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 Subsequently he tells me he has not utilized any recreational drugs for two years. This is obviously excellent news. He also does have diabetes type II. It does sound like he is trying to improve things as far as his overall health status  is concerned although his hemoglobin A-1 C as measured on 09/27/18 was 10.7. He did have an x-ray as well on 09/28/18 and this was negative for any evidence of acute or chronic osteomyelitis or fracture. Patient's wound is not appear to be severely infected at this time which is good news. With that being said he also doesn't really have any pain this is secondary to neuropathy. No fevers, chills, nausea, or vomiting noted at this time. 10/18/2018 on evaluation today patient actually appears to be doing better with regard to his toe ulcer. He has been tolerating the dressing changes without complication. Fortunately there is no signs of active infection at this time. Overall very pleased in this regard and how things are doing. Patient likewise is pleased and happy to see that things are doing much better. Still he does have some ways to go to get this area to completely close. 10/30/2018 on evaluation today patient's ulcer on his plantar foot actually appears to be doing quite well at this time. He has been tolerating the dressing changes without complication which is excellent news. With that being said I do believe he is making progress there is some callus buildup on without the clear away today but other than that I am seeing evidence that this seems to be filling in from the base and is getting much smaller in the base. Still were not at the point of complete  closure yet. 11/09/2018 on evaluation today patient actually appears to be doing quite well with regard to his toe ulcer from the standpoint of infection. I do not see any signs of active infection at this time which is good news. With that being said he unfortunately is not really making much progress either he continues to have depth of the wound despite the fact that it seems to be callused over initially on inspection when I probed and actually remove some of the callus it is much deeper. Fortunately there is no signs of active infection at this time. 11/16/2018 on evaluation today patient's toe ulcer actually appears to be doing about the same. There is really no significant improvement compared to last time I saw him. There is some debridement necessary today he has callus covering the wound area I think he may be a good candidate for total contact cast to rather rapidly get this area to close. We previously have discussed this although we have not initiated it yet I think it may be time to do so and go ahead and get this closed. 11/22/2018 on evaluation today patient appears to be doing better with regard to the overall size of his wound. He has been tolerating the dressing changes without complication. Fortunately there is no signs of active infection at this time. No fevers, chills, nausea, vomiting, or diarrhea. Patient History Information obtained from Patient. Martin JeffersonOLIVER, Virat ([id number]) Family History Diabetes - Mother,Father,Paternal Grandparents,Maternal Grandparents, No family history of Cancer, Heart Disease, Hereditary Spherocytosis, Hypertension, Kidney Disease, Lung Disease, Seizures, Stroke, Thyroid Problems, Tuberculosis. Social History Current every day smoker, Marital Status - Single, Alcohol Use - Rarely, Drug Use - Prior History, Caffeine Use - Daily. Medical History Cardiovascular Patient has history of Coronary Artery Disease, Hypertension, Myocardial  Infarction Denies history of Angina, Arrhythmia, Congestive Heart Failure, Deep Vein Thrombosis, Hypotension, Peripheral Arterial Disease, Peripheral Venous Disease, Phlebitis, Vasculitis Endocrine Patient has history of Type II Diabetes Denies history of Type I Diabetes Integumentary (Skin) Denies history of History of Burn, History of pressure wounds Medical And  Surgical History Notes Cardiovascular HLD Review of Systems (ROS) Constitutional Symptoms (General Health) Denies complaints or symptoms of Fatigue, Fever, Chills, Marked Weight Change. Respiratory Denies complaints or symptoms of Chronic or frequent coughs, Shortness of Breath. Cardiovascular Denies complaints or symptoms of Chest pain, LE edema. Psychiatric Denies complaints or symptoms of Anxiety, Claustrophobia. Objective Constitutional Well-nourished and well-hydrated in no acute distress. Vitals Time Taken: 3:03 PM, Height: 70 in, Weight: 230 lbs, BMI: 33, Temperature: 98.2 F, Pulse: 82 bpm, Respiratory Rate: 16 breaths/min, Blood Pressure: 137/77 mmHg. Respiratory normal breathing without difficulty. Psychiatric this patient is able to make decisions and demonstrates good insight into disease process. Alert and Oriented x 3. pleasant and cooperative. General Notes: Patient's wound bed currently showed evidence of good granulation at this time. Fortunately there does not [place], Turrell ([id number]) appear to be any signs of infection and overall I am extremely pleased with how things seem to have progressed up to this point. He has been wearing the offloading shoe more readily and I can definitely see that in the amount of callus buildup or rather lack thereof as well which is also good news. I did perform sharp debridement to remove callus from around the edge of the wound he tolerated this today without complication post debridement wound bed appears to be doing much better. Integumentary (Hair, Skin) Wound #1  status is Open. Original cause of wound was Gradually Appeared. The wound is located on the Left Toe Great. The wound measures 0.1cm length x 0.2cm width x 0.3cm depth; 0.016cm^2 area and 0.005cm^3 volume. There is Fat Layer (Subcutaneous Tissue) Exposed exposed. There is no tunneling or undermining noted. There is a medium amount of serous drainage noted. The wound margin is flat and intact. There is large (67-100%) pink granulation within the wound bed. There is no necrotic tissue within the wound bed. Assessment Active Problems ICD-10 Type 2 diabetes mellitus with foot ulcer Non-pressure chronic ulcer of other part of left foot with fat layer exposed Essential (primary) hypertension Atherosclerotic heart disease of native coronary artery without angina pectoris Procedures Wound #1 Pre-procedure diagnosis of Wound #1 is a Diabetic Wound/Ulcer of the Lower Extremity located on the Left Toe Great .Severity of Tissue Pre Debridement is: Fat layer exposed. There was a Selective/Open Wound Non-Viable Tissue Debridement with a total area of 0.02 sq cm performed by STONE III, HOYT E., PA-C. With the following instrument(s): Curette to remove Viable and Non-Viable tissue/material. Material removed includes Callus after achieving pain control using Lidocaine. A time out was conducted at 15:26, prior to the start of the procedure. There was no bleeding. The procedure was tolerated well. Post Debridement Measurements: 0.3cm length x 0.2cm width x 0.3cm depth; 0.014cm^3 volume. Character of Wound/Ulcer Post Debridement is stable. Severity of Tissue Post Debridement is: Fat layer exposed. Post procedure Diagnosis Wound #1: Same as Pre-Procedure Plan Wound Cleansing: Wound #1 Left Toe Great: Clean wound with Normal Saline. - in office Dial antibacterial soap, wash wounds, rinse and pat dry prior to dressing wounds Primary Wound Dressing: Wound #1 Left Toe Great: Iodoflex Secondary Dressing: Wound  #1 Left Toe GreatMALO, PRAYER ([id number]) Foam Other - secure with tape Dressing Change Frequency: Wound #1 Left Toe Great: Change dressing every other day. Follow-up Appointments: Wound #1 Left Toe Great: Return Appointment in 1 week. Off-Loading: Wound #1 Left Toe Great: Open toe surgical shoe with peg assist. 1. I am in a suggest that we switch to Iodoflex at this time. The patient is  in agreement with this plan we will show him how to utilize this. 2. I am also going to suggest that we go ahead and continue with the PEG assist offloading shoe which seems to be helpful for him when he is wearing this it has made a big difference. 3. I am also going to suggest that he attempt as much as possible to not walk unless he absolutely has to. We discussed total contact cast he really does not want to use that at this point. We will see patient back for reevaluation in 1 week here in the clinic. If anything worsens or changes patient will contact our office for additional recommendations. Electronic Signature(s) Signed: 11/22/2018 3:34:35 PM By: Lenda Kelp PA-C Entered By: Lenda Kelp on 11/22/2018 15:34:35 LORIMER, TIBERIO ([id number]) -------------------------------------------------------------------------------- ROS/PFSH Details Patient Name: Martin Mullen Date of Service: 11/22/2018 3:00 PM Medical Record Number: [id number] Patient Account Number: [id number] Date of Birth/Sex: 1980-02-19 (38 y.o. M) Treating RN: Rodell Perna Primary Care Provider: [name] Other Clinician: Referring Provider: [name] Treating Provider/Extender: Linwood Dibbles, HOYT Weeks in Treatment: 6 Information Obtained From Patient Constitutional Symptoms (General Health) Complaints and Symptoms: Negative for: Fatigue; Fever; Chills; Marked Weight Change Respiratory Complaints and Symptoms: Negative for: Chronic or frequent coughs; Shortness of Breath Cardiovascular Complaints and  Symptoms: Negative for: Chest pain; LE edema Medical History: Positive for: Coronary Artery Disease; Hypertension; Myocardial Infarction Negative for: Angina; Arrhythmia; Congestive Heart Failure; Deep Vein Thrombosis; Hypotension; Peripheral Arterial Disease; Peripheral Venous Disease; Phlebitis; Vasculitis Past Medical History Notes: HLD Psychiatric Complaints and Symptoms: Negative for: Anxiety; Claustrophobia Endocrine Medical History: Positive for: Type II Diabetes Negative for: Type I Diabetes Treated with: Oral agents Integumentary (Skin) Medical History: Negative for: History of Burn; History of pressure wounds Immunizations Pneumococcal Vaccine: Received Pneumococcal Vaccination: Yes Implantable Devices None MARTON, MALIZIA ([id number]) Family and Social History Cancer: No; Diabetes: Yes - Mother,Father,Paternal Grandparents,Maternal Grandparents; Heart Disease: No; Hereditary Spherocytosis: No; Hypertension: No; Kidney Disease: No; Lung Disease: No; Seizures: No; Stroke: No; Thyroid Problems: No; Tuberculosis: No; Current every day smoker; Marital Status - Single; Alcohol Use: Rarely; Drug Use: Prior History; Caffeine Use: Daily; Financial Concerns: No; Food, Clothing or Shelter Needs: No; Support System Lacking: No; Transportation Concerns: No Physician Affirmation I have reviewed and agree with the above information. Electronic Signature(s) Signed: 11/22/2018 5:11:53 PM By: Lenda Kelp PA-C Signed: 12/03/2018 8:11:06 AM By: Rodell Perna Entered By: Lenda Kelp on 11/22/2018 15:33:26 KAYAN, BLISSETT ([id number]) -------------------------------------------------------------------------------- SuperBill Details Patient Name: Martin Mullen Date of Service: 11/22/2018 Medical Record Number: [id number] Patient Account Number: [id number] Date of Birth/Sex: [birth year] (38 y.o. M) Treating RN: Rodell Perna Primary Care Provider: [name] Other  Clinician: Referring Provider: [name] Treating Provider/Extender: Linwood Dibbles, HOYT Weeks in Treatment: 6 Diagnosis Coding ICD-10 Codes Code Description E11.621 Type 2 diabetes mellitus with foot ulcer L97.522 Non-pressure chronic ulcer of other part of left foot with fat layer exposed I10 Essential (primary) hypertension I25.10 Atherosclerotic heart disease of native coronary artery without angina pectoris Facility Procedures CPT4 Code: [id number] Description: 97597 - DEBRIDE WOUND 1ST 20 SQ CM OR < ICD-10 Diagnosis Description L97.522 Non-pressure chronic ulcer of other part of left foot with fat la Modifier: yer exposed Quantity: 1 Physician Procedures CPT4 Code: 2536644 Description: 97597 - [facility] DEBR WO ANESTH 20 SQ CM ICD-10 Diagnosis Description 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

## 2018-11-22 NOTE — Addendum Note (Signed)
Addended by: Lurlean Nanny on: 11/22/2018 03:34 PM   Modules accepted: Orders

## 2018-11-22 NOTE — Telephone Encounter (Signed)
New Rx for Lantus sent to pharmacy

## 2018-11-22 NOTE — Telephone Encounter (Signed)
Patient stated that his insurance is denied covering his Levemir  They sent him a letter on what they would cover if he could be switched   Lantus solostar Toujeo  (stated there are 3 different types on Toujeo on the list)

## 2018-11-26 ENCOUNTER — Telehealth: Payer: Self-pay | Admitting: Internal Medicine

## 2018-11-26 NOTE — Telephone Encounter (Signed)
Disregard  See phone note 10/8 Pt will check pharmacy

## 2018-11-26 NOTE — Telephone Encounter (Signed)
Best number [phone]  Pt called wanting to leave message for [facility] plan will not cover levimar Pt has been out of his medication for 3 week [facility] [address]

## 2018-11-29 ENCOUNTER — Other Ambulatory Visit: Payer: Self-pay

## 2018-11-29 ENCOUNTER — Encounter: Payer: BC Managed Care – PPO | Admitting: Physician Assistant

## 2018-11-29 DIAGNOSIS — E11621 Type 2 diabetes mellitus with foot ulcer: Secondary | ICD-10-CM | POA: Diagnosis not present

## 2018-11-29 DIAGNOSIS — L03032 Cellulitis of left toe: Secondary | ICD-10-CM | POA: Diagnosis not present

## 2018-11-29 DIAGNOSIS — I25119 Atherosclerotic heart disease of native coronary artery with unspecified angina pectoris: Secondary | ICD-10-CM | POA: Diagnosis not present

## 2018-11-29 DIAGNOSIS — F17200 Nicotine dependence, unspecified, uncomplicated: Secondary | ICD-10-CM | POA: Diagnosis not present

## 2018-11-29 DIAGNOSIS — L97522 Non-pressure chronic ulcer of other part of left foot with fat layer exposed: Secondary | ICD-10-CM | POA: Diagnosis not present

## 2018-11-29 DIAGNOSIS — Z720 Tobacco use: Secondary | ICD-10-CM | POA: Diagnosis not present

## 2018-11-29 DIAGNOSIS — I252 Old myocardial infarction: Secondary | ICD-10-CM | POA: Diagnosis not present

## 2018-11-29 DIAGNOSIS — I2510 Atherosclerotic heart disease of native coronary artery without angina pectoris: Secondary | ICD-10-CM | POA: Diagnosis not present

## 2018-11-29 DIAGNOSIS — E1169 Type 2 diabetes mellitus with other specified complication: Secondary | ICD-10-CM | POA: Insufficient documentation

## 2018-11-29 DIAGNOSIS — E785 Hyperlipidemia, unspecified: Secondary | ICD-10-CM | POA: Insufficient documentation

## 2018-11-29 DIAGNOSIS — I10 Essential (primary) hypertension: Secondary | ICD-10-CM | POA: Diagnosis not present

## 2018-11-29 DIAGNOSIS — E1142 Type 2 diabetes mellitus with diabetic polyneuropathy: Secondary | ICD-10-CM | POA: Diagnosis not present

## 2018-11-29 DIAGNOSIS — E1159 Type 2 diabetes mellitus with other circulatory complications: Secondary | ICD-10-CM | POA: Diagnosis not present

## 2018-11-29 NOTE — Progress Notes (Addendum)
GARI, HARTSELL ([id number]) Visit Report for 11/29/2018 Chief Complaint Document Details Patient Name: STEPHENSON, CICHY Date of Service: 11/29/2018 8:15 AM Medical Record Number: [id number] Patient Account Number: [id number] Date of Birth/Sex: 11-11-1980 (38 y.o. M) Treating RN: Cornell Barman Primary Care Provider: Webb Silversmith Other Clinician: Referring Provider: Webb Silversmith Treating Provider/Extender: Melburn Hake, Norberta Stobaugh Weeks in Treatment: 7 Information Obtained from: Patient Chief Complaint left great toe ulcer Electronic Signature(s) Signed: 11/29/2018 8:20:15 AM By: Worthy Keeler PA-C Entered By: Worthy Keeler on 11/29/2018 08:20:15 ARISTOTLE, LIEB ([id number]) -------------------------------------------------------------------------------- Debridement Details Patient Name: [name] Date of Service: 11/29/2018 8:15 AM Medical Record Number: [id number] Patient Account Number: [id number] Date of Birth/Sex: 05/30/1980 (38 y.o. M) Treating RN: Cornell Barman Primary Care Provider: Webb Silversmith Other Clinician: Referring Provider: Webb Silversmith Treating Provider/Extender: Melburn Hake, Aleah Ahlgrim Weeks in Treatment: 7 Debridement Performed for Wound #1 Left Toe Great Assessment: Performed By: Physician STONE III, Luisangel Wainright E., PA-C Debridement Type: Debridement Severity of Tissue Pre Fat layer exposed Debridement: Level of Consciousness (Pre- Awake and Alert procedure): Pre-procedure Verification/Time Yes - 08:36 Out Taken: Start Time: 08:36 Pain Control: Lidocaine Total Area Debrided (L x W): 0.2 (cm) x 0.2 (cm) = 0.04 (cm) Tissue and other material Viable, Non-Viable, Slough, Subcutaneous, Slough debrided: Level: Skin/Subcutaneous Tissue Debridement Description: Excisional Instrument: Curette Bleeding: Minimum Hemostasis Achieved: Pressure End Time: 08:38 Response to Treatment: Procedure was tolerated well Level of Consciousness Awake and Alert (Post-procedure): Post  Debridement Measurements of Total Wound Length: (cm) 0.4 Width: (cm) 0.4 Depth: (cm) 0.5 Volume: (cm) 0.063 Character of Wound/Ulcer Post Debridement: Stable Severity of Tissue Post Debridement: Fat layer exposed Post Procedure Diagnosis Same as Pre-procedure Electronic Signature(s) Signed: 11/29/2018 5:55:45 PM By: Gretta Cool, BSN, RN, CWS, Kim RN, BSN Signed: 11/29/2018 6:39:08 PM By: Worthy Keeler PA-C Entered By: Gretta Cool, BSN, RN, CWS, Kim on 11/29/2018 08:38:38 LAWAYNE, HARTIG ([id number]) -------------------------------------------------------------------------------- HPI Details Patient Name: [name] Date of Service: 11/29/2018 8:15 AM Medical Record Number: [id number] Patient Account Number: [id number] Date of Birth/Sex: 12-Jun-1980 (37 y.o. M) Treating RN: Cornell Barman Primary Care Provider: Webb Silversmith Other Clinician: Referring Provider: Webb Silversmith Treating Provider/Extender: Melburn Hake, Mekala Winger Weeks in Treatment: 7 History of Present Illness HPI Description: 10/11/18 patient presents today for initial evaluation our clinic. He is being seen due to a left great toe ulcer which unfortunately has been present for quite some time. Initially he thought it really wasn't much of anything and then subsequently noted that he had some drainage then that seem to dry off and he didn't worry too much about it until more recently when he is had issues. 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 Subsequently he tells me he has not utilized any recreational drugs for two years. This is obviously excellent news. He also does have diabetes type II. It does sound like he is trying to improve things as far as his overall health status is concerned although his hemoglobin A-1 C as measured on 09/27/18 was 10.7. He did have an x-ray as well on 09/28/18 and this was negative for any  evidence of acute or chronic osteomyelitis or fracture. Patient's wound is not appear to be severely infected at this time which is good news. With that being said he also doesn't really have any pain this is secondary to neuropathy. No fevers, chills, nausea, or vomiting noted at this time. 10/18/2018 on  evaluation today patient actually appears to be doing better with regard to his toe ulcer. He has been tolerating the dressing changes without complication. Fortunately there is no signs of active infection at this time. Overall very pleased in this regard and how things are doing. Patient likewise is pleased and happy to see that things are doing much better. Still he does have some ways to go to get this area to completely close. 10/30/2018 on evaluation today patient's ulcer on his plantar foot actually appears to be doing quite well at this time. He has been tolerating the dressing changes without complication which is excellent news. With that being said I do believe he is making progress there is some callus buildup on without the clear away today but other than that I am seeing evidence that this seems to be filling in from the base and is getting much smaller in the base. Still were not at the point of complete closure yet. 11/09/2018 on evaluation today patient actually appears to be doing quite well with regard to his toe ulcer from the standpoint of infection. I do not see any signs of active infection at this time which is good news. With that being said he unfortunately is not really making much progress either he continues to have depth of the wound despite the fact that it seems to be callused over initially on inspection when I probed and actually remove some of the callus it is much deeper. Fortunately there is no signs of active infection at this time. 11/16/2018 on evaluation today patient's toe ulcer actually appears to be doing about the same. There is really no  significant improvement compared to last time I saw him. There is some debridement necessary today he has callus covering the wound area I think he may be a good candidate for total contact cast to rather rapidly get this area to close. We previously have discussed this although we have not initiated it yet I think it may be time to do so and go ahead and get this closed. 11/22/2018 on evaluation today patient appears to be doing better with regard to the overall size of his wound. He has been tolerating the dressing changes without complication. Fortunately there is no signs of active infection at this time. No fevers, chills, nausea, vomiting, or diarrhea. 11/29/2018 on evaluation today patient appears to be doing about the same really with regard to his toe ulcer. He is not showing any signs of dramatic improvement unfortunately. There is no signs of infection at this time. No fevers, chills, nausea, vomiting, or diarrhea. I really think we need to consider the total contact cast at this point in order to go ahead and get this closed. Electronic Signature(s) Signed: 11/29/2018 8:40:13 AM By: Farley Ly, Jari ([id number]) Entered By: Lenda Kelp on 11/29/2018 08:40:13 QUANTAVIS, RIVETT ([id number]) -------------------------------------------------------------------------------- Physical Exam Details Patient Name: [name] Date of Service: 11/29/2018 8:15 AM Medical Record Number: [id number] Patient Account Number: [id number] Date of Birth/Sex: [birth year] (38 y.o. M) Treating RN: Huel Coventry Primary Care Provider: [name] Other Clinician: Referring Provider: [name] Treating Provider/Extender: STONE III, Ngan Qualls Weeks in Treatment: 7 Constitutional Well-nourished and well-hydrated in no acute distress. Respiratory normal breathing without difficulty. clear to auscultation bilaterally. Cardiovascular regular rate and rhythm with normal S1,  S2. Psychiatric this patient is able to make decisions and demonstrates good insight into disease process. Alert and Oriented x 3. pleasant and cooperative. Notes Upon inspection today  patient's wound bed again showed signs of callus buildup did require sharp debridement to clear this away. Post debridement wound bed appears to be doing much better which is great news. There does not appear to be signs of infection and there is good granulation at the base of the wound. I really do believe that he is going to require the total contact cast to get this to close I want to get that close before it causes any Electronic Signature(s) Signed: 11/29/2018 8:40:43 AM By: Lenda KelpStone III, Latacha Texeira PA-C Entered By: Lenda KelpStone III, Naomi Fitton on 11/29/2018 08:40:42 Evalee JeffersonOLIVER, Alex ([id number]) -------------------------------------------------------------------------------- Physician Orders Details Patient Name: Evalee JeffersonLIVER, Clayton Date of Service: 11/29/2018 8:15 AM Medical Record Number: [id number] Patient Account Number: [id number] Date of Birth/Sex: [birth year] (38 y.o. M) Treating RN: Huel CoventryWoody, Kim Primary Care Provider: Nicki ReaperBAITY, REGINA Other Clinician: Referring Provider: Nicki ReaperBAITY, REGINA Treating Provider/Extender: Linwood DibblesSTONE III, Tamarcus Condie Weeks in Treatment: 7 Verbal / Phone Orders: No Diagnosis Coding ICD-10 Coding Code Description E11.621 Type 2 diabetes mellitus with foot ulcer L97.522 Non-pressure chronic ulcer of other part of left foot with fat layer exposed I10 Essential (primary) hypertension I25.10 Atherosclerotic heart disease of native coronary artery without angina pectoris Wound Cleansing Wound #1 Left Toe Great o Clean wound with Normal Saline. - in office o Dial antibacterial soap, wash wounds, rinse and pat dry prior to dressing wounds Primary Wound Dressing Wound #1 Left Toe Great o Iodoflex Secondary Dressing Wound #1 Left Toe Great o Foam o Other - secure with tape Dressing Change Frequency Wound  #1 Left Toe Great o Change dressing every other day. Follow-up Appointments Wound #1 Left Toe Great o Return Appointment in 1 week. Off-Loading Wound #1 Left Toe Great o Open toe surgical shoe with peg assist. Electronic Signature(s) Signed: 11/29/2018 5:55:45 PM By: Elliot GurneyWoody, BSN, RN, CWS, Kim RN, BSN Signed: 11/29/2018 6:39:08 PM By: Lenda KelpStone III, Kimari Coudriet PA-C Entered By: Elliot GurneyWoody, BSN, RN, CWS, Kim on 11/29/2018 08:37:44 Evalee JeffersonOLIVER, Andric ([id number]) -------------------------------------------------------------------------------- Problem List Details Patient Name: Evalee JeffersonLIVER, Zamar Date of Service: 11/29/2018 8:15 AM Medical Record Number: [id number] Patient Account Number: [id number] Date of Birth/Sex: [birth year] (38 y.o. M) Treating RN: Huel CoventryWoody, Kim Primary Care Provider: Nicki ReaperBAITY, REGINA Other Clinician: Referring Provider: Nicki ReaperBAITY, REGINA Treating Provider/Extender: Linwood DibblesSTONE III, Dacen Frayre Weeks in Treatment: 7 Active Problems ICD-10 Evaluated Encounter Code Description Active Date Today Diagnosis E11.621 Type 2 diabetes mellitus with foot ulcer 10/11/2018 No Yes L97.522 Non-pressure chronic ulcer of other part of left foot with fat 10/11/2018 No Yes layer exposed I10 Essential (primary) hypertension 10/11/2018 No Yes I25.10 Atherosclerotic heart disease of native coronary artery 10/11/2018 No Yes without angina pectoris Inactive Problems Resolved Problems Electronic Signature(s) Signed: 11/29/2018 8:20:08 AM By: Lenda KelpStone III, Elmire Amrein PA-C Entered By: Lenda KelpStone III, Estanislado Surgeon on 11/29/2018 08:20:07 Evalee JeffersonLIVER, Trebor ([id number]) -------------------------------------------------------------------------------- Progress Note Details Patient Name: Evalee JeffersonLIVER, Jodey Date of Service: 11/29/2018 8:15 AM Medical Record Number: [id number] Patient Account Number: [id number] Date of Birth/Sex: [birth year] (38 y.o. M) Treating RN: Huel CoventryWoody, Kim Primary Care Provider: Nicki ReaperBAITY, REGINA Other Clinician: Referring Provider: Nicki ReaperBAITY,  REGINA Treating Provider/Extender: Linwood DibblesSTONE III, Willys Salvino Weeks in Treatment: 7 Subjective Chief Complaint Information obtained from Patient left great toe ulcer History of Present Illness (HPI) 10/11/18 patient presents today for initial evaluation our clinic. He is being seen due to a left great toe ulcer which unfortunately has been present for quite some time. Initially he thought it really wasn't much of anything and then subsequently noted that he had some drainage then that seem to dry off and he  didn't worry too much about it until more recently when he is had issues. 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 Subsequently he tells me he has not utilized any recreational drugs for two years. This is obviously excellent news. He also does have diabetes type II. It does sound like he is trying to improve things as far as his overall health status is concerned although his hemoglobin A-1 C as measured on 09/27/18 was 10.7. He did have an x-ray as well on 09/28/18 and this was negative for any evidence of acute or chronic osteomyelitis or fracture. Patient's wound is not appear to be severely infected at this time which is good news. With that being said he also doesn't really have any pain this is secondary to neuropathy. No fevers, chills, nausea, or vomiting noted at this time. 10/18/2018 on evaluation today patient actually appears to be doing better with regard to his toe ulcer. He has been tolerating the dressing changes without complication. Fortunately there is no signs of active infection at this time. Overall very pleased in this regard and how things are doing. Patient likewise is pleased and happy to see that things are doing much better. Still he does have some ways to go to get this area to completely close. 10/30/2018 on evaluation today patient's ulcer on his plantar foot  actually appears to be doing quite well at this time. He has been tolerating the dressing changes without complication which is excellent news. With that being said I do believe he is making progress there is some callus buildup on without the clear away today but other than that I am seeing evidence that this seems to be filling in from the base and is getting much smaller in the base. Still were not at the point of complete closure yet. 11/09/2018 on evaluation today patient actually appears to be doing quite well with regard to his toe ulcer from the standpoint of infection. I do not see any signs of active infection at this time which is good news. With that being said he unfortunately is not really making much progress either he continues to have depth of the wound despite the fact that it seems to be callused over initially on inspection when I probed and actually remove some of the callus it is much deeper. Fortunately there is no signs of active infection at this time. 11/16/2018 on evaluation today patient's toe ulcer actually appears to be doing about the same. There is really no significant improvement compared to last time I saw him. There is some debridement necessary today he has callus covering the wound area I think he may be a good candidate for total contact cast to rather rapidly get this area to close. We previously have discussed this although we have not initiated it yet I think it may be time to do so and go ahead and get this closed. 11/22/2018 on evaluation today patient appears to be doing better with regard to the overall size of his wound. He has been tolerating the dressing changes without complication. Fortunately there is no signs of active infection at this time. No fevers, chills, nausea, vomiting, or diarrhea. 11/29/2018 on evaluation today patient appears to be doing about the same really with regard to his toe ulcer. He is not showing any signs of dramatic  improvement unfortunately. There is no signs of infection  at this time. No fevers, chills, nausea, vomiting, or diarrhea. I really think we need to consider the total contact cast at this point in order to go ahead and get this closed. CRESCENCIO, JOZWIAK ([id number]) Patient History Information obtained from Patient. Family History Diabetes - Mother,Father,Paternal Grandparents,Maternal Grandparents, No family history of Cancer, Heart Disease, Hereditary Spherocytosis, Hypertension, Kidney Disease, Lung Disease, Seizures, Stroke, Thyroid Problems, Tuberculosis. Social History Current every day smoker, Marital Status - Single, Alcohol Use - Rarely, Drug Use - Prior History, Caffeine Use - Daily. Medical History Cardiovascular Patient has history of Coronary Artery Disease, Hypertension, Myocardial Infarction Denies history of Angina, Arrhythmia, Congestive Heart Failure, Deep Vein Thrombosis, Hypotension, Peripheral Arterial Disease, Peripheral Venous Disease, Phlebitis, Vasculitis Endocrine Patient has history of Type II Diabetes Denies history of Type I Diabetes Integumentary (Skin) Denies history of History of Burn, History of pressure wounds Medical And Surgical History Notes Cardiovascular HLD Review of Systems (ROS) Constitutional Symptoms (General Health) Denies complaints or symptoms of Fatigue, Fever, Chills, Marked Weight Change. Respiratory Denies complaints or symptoms of Chronic or frequent coughs, Shortness of Breath. Cardiovascular Denies complaints or symptoms of Chest pain, LE edema. Psychiatric Denies complaints or symptoms of Anxiety, Claustrophobia. Objective Constitutional Well-nourished and well-hydrated in no acute distress. Vitals Time Taken: 8:25 AM, Height: 70 in, Weight: 230 lbs, BMI: 33, Temperature: 97.9 F, Pulse: 72 bpm, Respiratory Rate: 16 breaths/min, Blood Pressure: 133/73 mmHg. Respiratory normal breathing without difficulty. clear to  auscultation bilaterally. Cardiovascular STEPHAN, NELIS ([id number]) regular rate and rhythm with normal S1, S2. Psychiatric this patient is able to make decisions and demonstrates good insight into disease process. Alert and Oriented x 3. pleasant and cooperative. General Notes: Upon inspection today patient's wound bed again showed signs of callus buildup did require sharp debridement to clear this away. Post debridement wound bed appears to be doing much better which is great news. There does not appear to be signs of infection and there is good granulation at the base of the wound. I really do believe that he is going to require the total contact cast to get this to close I want to get that close before it causes any Integumentary (Hair, Skin) Wound #1 status is Open. Original cause of wound was Gradually Appeared. The wound is located on the Left Toe Great. The wound measures 0.1cm length x 0.2cm width x 0.5cm depth; 0.016cm^2 area and 0.008cm^3 volume. There is Fat Layer (Subcutaneous Tissue) Exposed exposed. There is no tunneling or undermining noted. There is a medium amount of serous drainage noted. The wound margin is flat and intact. There is large (67-100%) pink granulation within the wound bed. There is no necrotic tissue within the wound bed. Assessment Active Problems ICD-10 Type 2 diabetes mellitus with foot ulcer Non-pressure chronic ulcer of other part of left foot with fat layer exposed Essential (primary) hypertension Atherosclerotic heart disease of native coronary artery without angina pectoris Procedures Wound #1 Pre-procedure diagnosis of Wound #1 is a Diabetic Wound/Ulcer of the Lower Extremity located on the Left Toe Great .Severity of Tissue Pre Debridement is: Fat layer exposed. There was a Excisional Skin/Subcutaneous Tissue Debridement with a total area of 0.04 sq cm performed by STONE III, Sya Nestler E., PA-C. With the following instrument(s): Curette to remove  Viable and Non-Viable tissue/material. Material removed includes Subcutaneous Tissue and Slough and after achieving pain control using Lidocaine. No specimens were taken. A time out was conducted at 08:36, prior to the start of the procedure. A Minimum amount  of bleeding was controlled with Pressure. The procedure was tolerated well. Post Debridement Measurements: 0.4cm length x 0.4cm width x 0.5cm depth; 0.063cm^3 volume. Character of Wound/Ulcer Post Debridement is stable. Severity of Tissue Post Debridement is: Fat layer exposed. Post procedure Diagnosis Wound #1: Same as Pre-Procedure Plan Wound Cleansing: AMERICO, VALLERY ([id number]) Wound #1 Left Toe Great: Clean wound with Normal Saline. - in office Dial antibacterial soap, wash wounds, rinse and pat dry prior to dressing wounds Primary Wound Dressing: Wound #1 Left Toe Great: Iodoflex Secondary Dressing: Wound #1 Left Toe Great: Foam Other - secure with tape Dressing Change Frequency: Wound #1 Left Toe Great: Change dressing every other day. Follow-up Appointments: Wound #1 Left Toe Great: Return Appointment in 1 week. Off-Loading: Wound #1 Left Toe Great: Open toe surgical shoe with peg assist. 1. We will get a continue at this time with the current wound care measures which includes the Iodoflex which were attempting to do to get this to stay clean and fill in. 2. I am in a suggest as well that we continue with the offloading shoe which I think is still appropriate for the patient to be wearing in fact he needs to be wearing this all the time. 3. I am also going to suggest that we go ahead and continue to use gauze padding for the dressing including Kerlix secured with tape. 4. I am in a continue to see the patient and in fact I plan to see him next Tuesday where I hope to initiate treatment with the total contact cast at that point. We will see patient back for reevaluation in 1 week here in the clinic. If anything  worsens or changes patient will contact our office for additional recommendations. Electronic Signature(s) Signed: 11/29/2018 8:41:46 AM By: Lenda Kelp PA-C Entered By: Lenda Kelp on 11/29/2018 08:41:45 LELA, GELL ([id number]) -------------------------------------------------------------------------------- ROS/PFSH Details Patient Name: [name] Date of Service: 11/29/2018 8:15 AM Medical Record Number: [id number] Patient Account Number: [id number] Date of Birth/Sex: [birth year] (38 y.o. M) Treating RN: Huel Coventry Primary Care Provider: [name] Other Clinician: Referring Provider: [name] Treating Provider/Extender: Linwood Dibbles, Lus Kriegel Weeks in Treatment: 7 Information Obtained From Patient Constitutional Symptoms (General Health) Complaints and Symptoms: Negative for: Fatigue; Fever; Chills; Marked Weight Change Respiratory Complaints and Symptoms: Negative for: Chronic or frequent coughs; Shortness of Breath Cardiovascular Complaints and Symptoms: Negative for: Chest pain; LE edema Medical History: Positive for: Coronary Artery Disease; Hypertension; Myocardial Infarction Negative for: Angina; Arrhythmia; Congestive Heart Failure; Deep Vein Thrombosis; Hypotension; Peripheral Arterial Disease; Peripheral Venous Disease; Phlebitis; Vasculitis Past Medical History Notes: HLD Psychiatric Complaints and Symptoms: Negative for: Anxiety; Claustrophobia Endocrine Medical History: Positive for: Type II Diabetes Negative for: Type I Diabetes Treated with: Oral agents Integumentary (Skin) Medical History: Negative for: History of Burn; History of pressure wounds Immunizations Pneumococcal Vaccine: Received Pneumococcal Vaccination: Yes Implantable Devices None VANN, OKERLUND ([id number]) Family and Social History Cancer: No; Diabetes: Yes - Mother,Father,Paternal Grandparents,Maternal Grandparents; Heart Disease: No; Hereditary Spherocytosis: No;  Hypertension: No; Kidney Disease: No; Lung Disease: No; Seizures: No; Stroke: No; Thyroid Problems: No; Tuberculosis: No; Current every day smoker; Marital Status - Single; Alcohol Use: Rarely; Drug Use: Prior History; Caffeine Use: Daily; Financial Concerns: No; Food, Clothing or Shelter Needs: No; Support System Lacking: No; Transportation Concerns: No Physician Affirmation I have reviewed and agree with the above information. Electronic Signature(s) Signed: 11/29/2018 5:55:45 PM By: Elliot Gurney, BSN, RN, CWS, Kim RN, BSN Signed: 11/29/2018 6:39:08 PM By: Larina Bras  III, Thoams Siefert PA-C Entered By: Lenda KelpStone III, Shaddai Shapley on 11/29/2018 08:40:25 Evalee JeffersonOLIVER, Luccas ([id number]) -------------------------------------------------------------------------------- SuperBill Details Patient Name: Evalee JeffersonLIVER, Edwardo Date of Service: 11/29/2018 Medical Record Number: [id number] Patient Account Number: [id number] Date of Birth/Sex: 12/26/1980 (38 y.o. M) Treating RN: Huel CoventryWoody, Kim Primary Care Provider: Nicki ReaperBAITY, REGINA Other Clinician: Referring Provider: Nicki ReaperBAITY, REGINA Treating Provider/Extender: Linwood DibblesSTONE III, Jalicia Roszak Weeks in Treatment: 7 Diagnosis Coding ICD-10 Codes Code Description E11.621 Type 2 diabetes mellitus with foot ulcer L97.522 Non-pressure chronic ulcer of other part of left foot with fat layer exposed I10 Essential (primary) hypertension I25.10 Atherosclerotic heart disease of native coronary artery without angina pectoris Facility Procedures CPT4 Code: [id number] Description: 11042 - DEB SUBQ TISSUE 20 SQ CM/< ICD-10 Diagnosis Description L97.522 Non-pressure chronic ulcer of other part of left foot with fat Modifier: layer exposed Quantity: 1 Physician Procedures CPT4 Code: [id number] Description: 11042 - WC PHYS SUBQ TISS 20 SQ CM ICD-10 Diagnosis Description L97.522 Non-pressure chronic ulcer of other part of left foot with fat Modifier: layer exposed Quantity: 1 Electronic Signature(s) Signed: 11/29/2018 8:41:54 AM  By: Lenda KelpStone III, Jontavius Rabalais PA-C Entered By: Lenda KelpStone III, Dajia Gunnels on 11/29/2018 08:41:53

## 2018-11-29 NOTE — Progress Notes (Addendum)
Martin Mullen, Martin Mullen ([id number]) Visit Report for 11/29/2018 Arrival Information Details Patient Name: Martin Mullen, Martin Mullen Date of Service: 11/29/2018 8:15 AM Medical Record Number: [id number] Patient Account Number: [id number] Date of Birth/Sex: 29-Mar-1980 (38 y.o. M) Treating RN: Arnette Norris Primary Care Virga Haltiwanger: [name] Other Clinician: Referring Yasser Hepp: [name] Treating Amier Hoyt/Extender: Linwood Dibbles, HOYT Weeks in Treatment: 7 Visit Information History Since Last Visit Added or deleted any medications: No Patient Arrived: Ambulatory Any new allergies or adverse reactions: No Arrival Time: 08:25 Had a fall or experienced change in No Accompanied By: self activities of daily living that may affect Transfer Assistance: None risk of falls: Patient Identification Verified: Yes Signs or symptoms of abuse/neglect since last visito No Secondary Verification Process Completed: Yes Hospitalized since last visit: No Has Dressing in Place as Prescribed: Yes Pain Present Now: No Electronic Signature(s) Signed: 11/29/2018 4:37:29 PM By: Arnette Norris Entered By: Arnette Norris on 11/29/2018 08:26:20 Martin Mullen ([id number]) -------------------------------------------------------------------------------- Encounter Discharge Information Details Patient Name: Martin Mullen Date of Service: 11/29/2018 8:15 AM Medical Record Number: [id number] Patient Account Number: [id number] Date of Birth/Sex: [birth year] (38 y.o. M) Treating RN: Huel Coventry Primary Care Arthuro Canelo: [name] Other Clinician: Referring Ly Wass: [name] Treating Kenedie Dirocco/Extender: Linwood Dibbles, HOYT Weeks in Treatment: 7 Encounter Discharge Information Items Post Procedure Vitals Discharge Condition: Stable Temperature (F): 97.9 Ambulatory Status: Ambulatory Pulse (bpm): 72 Discharge Destination: Home Respiratory Rate (breaths/min): 16 Transportation: Private Auto Blood Pressure (mmHg):  133/73 Accompanied By: self Schedule Follow-up Appointment: Yes Clinical Summary of Care: Electronic Signature(s) Signed: 11/29/2018 5:55:45 PM By: Elliot Gurney, BSN, RN, CWS, Kim RN, BSN Entered By: Elliot Gurney, BSN, RN, CWS, Kim on 11/29/2018 08:39:53 Martin Mullen ([id number]) -------------------------------------------------------------------------------- Lower Extremity Assessment Details Patient Name: Martin Mullen Date of Service: 11/29/2018 8:15 AM Medical Record Number: [id number] Patient Account Number: [id number] Date of Birth/Sex: 1981/01/28 (38 y.o. M) Treating RN: Arnette Norris Primary Care Athira Janowicz: [name] Other Clinician: Referring Morrissa Shein: [name] Treating Severn Goddard/Extender: Linwood Dibbles, HOYT Weeks in Treatment: 7 Vascular Assessment Pulses: Dorsalis Pedis Palpable: [Left:Yes] Posterior Tibial Palpable: [Left:Yes] Electronic Signature(s) Signed: 11/29/2018 4:37:29 PM By: Arnette Norris Entered By: Arnette Norris on 11/29/2018 08:30:34 Martin Mullen ([id number]) -------------------------------------------------------------------------------- Multi Wound Chart Details Patient Name: Martin Mullen Date of Service: 11/29/2018 8:15 AM Medical Record Number: [id number] Patient Account Number: [id number] Date of Birth/Sex: 1980/10/12 (38 y.o. M) Treating RN: Huel Coventry Primary Care Beckett Maden: [name] Other Clinician: Referring Rana Adorno: [name] Treating Leighann Amadon/Extender: Linwood Dibbles, HOYT Weeks in Treatment: 7 Vital Signs Height(in): 70 Pulse(bpm): 72 Weight(lbs): 230 Blood Pressure(mmHg): 133/73 Body Mass Index(BMI): 33 Temperature(F): 97.9 Respiratory Rate 16 (breaths/min): Photos: [N/A:N/A] Wound Location: Left Toe Great N/A N/A Wounding Event: Gradually Appeared N/A N/A Primary Etiology: Diabetic Wound/Ulcer of the N/A N/A Lower Extremity Comorbid History: Coronary Artery Disease, N/A N/A Hypertension, Myocardial Infarction, Type II  Diabetes Date Acquired: 08/30/2018 N/A N/A Weeks of Treatment: 7 N/A N/A Wound Status: Open N/A N/A Pending Amputation on Yes N/A N/A Presentation: Measurements L x W x D 0.1x0.2x0.5 N/A N/A (cm) Area (cm) : 0.016 N/A N/A Volume (cm) : 0.008 N/A N/A % Reduction in Area: -100.00% N/A N/A % Reduction in Volume: -60.00% N/A N/A Classification: Grade 1 N/A N/A Exudate Amount: Medium N/A N/A Exudate Type: Serous N/A N/A Exudate Color: amber N/A N/A Wound Margin: Flat and Intact N/A N/A Granulation Amount: Large (67-100%) N/A N/A Granulation Quality: Pink N/A N/A Necrotic Amount: None Present (0%) N/A N/A Exposed Structures: Fat Layer (Subcutaneous N/A N/A Tissue) Exposed: Yes Fascia:  No Martin Mullen, Martin Mullen ([id number]) Tendon: No Muscle: No Joint: No Bone: No Epithelialization: Medium (34-66%) N/A N/A Treatment Notes Electronic Signature(s) Signed: 11/29/2018 5:55:45 PM By: Elliot Gurney, BSN, RN, CWS, Kim RN, BSN Entered By: Elliot Gurney, BSN, RN, CWS, Kim on 11/29/2018 08:35:14 Martin Mullen, Martin Mullen ([id number]) -------------------------------------------------------------------------------- Multi-Disciplinary Care Plan Details Patient Name: Martin Mullen Date of Service: 11/29/2018 8:15 AM Medical Record Number: [id number] Patient Account Number: [id number] Date of Birth/Sex: Aug 25, 1980 (38 y.o. M) Treating RN: Huel Coventry Primary Care Javohn Basey: [name] Other Clinician: Referring Leonila Speranza: [name] Treating Rickelle Sylvestre/Extender: Linwood Dibbles, HOYT Weeks in Treatment: 7 Active Inactive Nutrition Nursing Diagnoses: Potential for alteratiion in Nutrition/Potential for imbalanced nutrition Goals: Patient/caregiver verbalizes understanding of need to maintain therapeutic glucose control per primary care physician Date Initiated: 10/11/2018 Target Resolution Date: 10/25/2018 Goal Status: Active Interventions: Assess HgA1c results as ordered upon admission and as needed Notes: Orientation to the  Wound Care Program Nursing Diagnoses: Knowledge deficit related to the wound healing center program Goals: Patient/caregiver will verbalize understanding of the Wound Healing Center Program Date Initiated: 10/11/2018 Target Resolution Date: 10/19/2018 Goal Status: Active Interventions: Provide education on orientation to the wound center Notes: Wound/Skin Impairment Nursing Diagnoses: Impaired tissue integrity Goals: Ulcer/skin breakdown will have a volume reduction of 30% by week 4 Date Initiated: 10/11/2018 Target Resolution Date: 10/19/2018 Goal Status: Active Interventions: Assess ulceration(s) every visit Martin Mullen, Martin Mullen ([id number]) Notes: Electronic Signature(s) Signed: 11/29/2018 5:55:45 PM By: Elliot Gurney, BSN, RN, CWS, Kim RN, BSN Entered By: Elliot Gurney, BSN, RN, CWS, Kim on 11/29/2018 08:35:08 Martin Mullen ([id number]) -------------------------------------------------------------------------------- Pain Assessment Details Patient Name: Martin Mullen Date of Service: 11/29/2018 8:15 AM Medical Record Number: [id number] Patient Account Number: [id number] Date of Birth/Sex: 12-09-1980 (38 y.o. M) Treating RN: Arnette Norris Primary Care Sudais Banghart: [name] Other Clinician: Referring Dayon Witt: [name] Treating Shayaan Parke/Extender: Linwood Dibbles, HOYT Weeks in Treatment: 7 Active Problems Location of Pain Severity and Description of Pain Patient Has Paino No Site Locations Pain Management and Medication Current Pain Management: Electronic Signature(s) Signed: 11/29/2018 4:37:29 PM By: Arnette Norris Entered By: Arnette Norris on 11/29/2018 08:26:30 Martin Mullen ([id number]) -------------------------------------------------------------------------------- Patient/Caregiver Education Details Patient Name: Martin Mullen Date of Service: 11/29/2018 8:15 AM Medical Record Number: [id number] Patient Account Number: [id number] Date of Birth/Gender: 1980/08/21 (38 y.o.  M) Treating RN: Huel Coventry Primary Care Physician: [name] Other Clinician: Referring Physician: [name] Treating Physician/Extender: Skeet Simmer in Treatment: 7 Education Assessment Education Provided To: Patient Education Topics Provided Pressure: Handouts: Other: wear surgical shoe daily Electronic Signature(s) Signed: 11/29/2018 5:55:45 PM By: Elliot Gurney, BSN, RN, CWS, Kim RN, BSN Entered By: Elliot Gurney, BSN, RN, CWS, Kim on 11/29/2018 08:38:13 Martin Mullen, Martin Mullen ([id number]) -------------------------------------------------------------------------------- Wound Assessment Details Patient Name: Martin Mullen Date of Service: 11/29/2018 8:15 AM Medical Record Number: [id number] Patient Account Number: [id number] Date of Birth/Sex: 08/06/1980 (38 y.o. M) Treating RN: Arnette Norris Primary Care Green Quincy: [name] Other Clinician: Referring Guillermina Shaft: [name] Treating Meloney Feld/Extender: STONE III, HOYT Weeks in Treatment: 7 Wound Status Wound Number: 1 Primary Diabetic Wound/Ulcer of the Lower Extremity Etiology: Wound Location: Left Toe Great Wound Open Wounding Event: Gradually Appeared Status: Date Acquired: 08/30/2018 Comorbid Coronary Artery Disease, Hypertension, Weeks Of Treatment: 7 History: Myocardial Infarction, Type II Diabetes Clustered Wound: No Pending Amputation On Presentation Photos Wound Measurements Length: (cm) 0.1 % Reduction in Width: (cm) 0.2 % Reduction in Depth: (cm) 0.5 Epithelializat Area: (cm) 0.016 Tunneling: Volume: (cm) 0.008 Undermining: Area: -100% Volume: -60% ion: Medium (34-66%) No No Wound Description Classification: Grade  1 Foul Odor Afte Wound Margin: Flat and Intact Slough/Fibrino Exudate Amount: Medium Exudate Type: Serous Exudate Color: amber r Cleansing: No No Wound Bed Granulation Amount: Large (67-100%) Exposed Structure Granulation Quality: Pink Fascia Exposed: No Necrotic Amount: None  Present (0%) Fat Layer (Subcutaneous Tissue) Exposed: Yes Tendon Exposed: No Muscle Exposed: No Joint Exposed: No Bone Exposed: No Treatment Notes Martin Mullen, Martin Mullen ([id number]) Wound #1 (Left Toe Great) Notes iodoflex, foam, medipore tape, peg assist shoe Left Electronic Signature(s) Signed: 11/29/2018 4:37:29 PM By: Harold Barban Entered By: Harold Barban on 11/29/2018 08:30:16 Martin Mullen ([id number]) -------------------------------------------------------------------------------- Vitals Details Patient Name: Martin Mullen Date of Service: 11/29/2018 8:15 AM Medical Record Number: [id number] Patient Account Number: [id number] Date of Birth/Sex: [birth year] (38 y.o. M) Treating RN: Harold Barban Primary Care Dillin Lofgren: Webb Silversmith Other Clinician: Referring Lylliana Kitamura: Webb Silversmith Treating Willard Madrigal/Extender: Melburn Hake, HOYT Weeks in Treatment: 7 Vital Signs Time Taken: 08:25 Temperature (F): 97.9 Height (in): 70 Pulse (bpm): 72 Weight (lbs): 230 Respiratory Rate (breaths/min): 16 Body Mass Index (BMI): 33 Blood Pressure (mmHg): 133/73 Reference Range: 80 - 120 mg / dl Electronic Signature(s) Signed: 11/29/2018 4:37:29 PM By: Harold Barban Entered By: Harold Barban on 11/29/2018 08:26:45

## 2018-12-03 NOTE — Progress Notes (Signed)
Martin Mullen, Martin Mullen ([id number]) Visit Report for 11/22/2018 Arrival Information Details Patient Name: Martin Mullen, Martin Mullen Date of Service: 11/22/2018 3:00 PM Medical Record Number: [id number] Patient Account Number: [id number] Date of Birth/Sex: 12-17-1980 (38 y.o. M) Treating RN: Rodell Perna Primary Care Tomiko Schoon: [name] Other Clinician: Referring Piotr Christopher: [name] Treating Geneviene Tesch/Extender: Linwood Dibbles, HOYT Weeks in Treatment: 6 Visit Information History Since Last Visit Added or deleted any medications: No Patient Arrived: Ambulatory Any new allergies or adverse reactions: No Arrival Time: 15:01 Had a fall or experienced change in No Accompanied By: self activities of daily living that may affect Transfer Assistance: None risk of falls: Signs or symptoms of abuse/neglect since last visito No Hospitalized since last visit: No Implantable device outside of the clinic excluding No cellular tissue based products placed in the center since last visit: Has Dressing in Place as Prescribed: Yes Pain Present Now: No Electronic Signature(s) Signed: 11/22/2018 4:27:49 PM By: Dayton Martes RCP, RRT, CHT Entered By: Dayton Martes on 11/22/2018 15:03:38 Martin Mullen ([id number]) -------------------------------------------------------------------------------- Encounter Discharge Information Details Patient Name: Martin Mullen Date of Service: 11/22/2018 3:00 PM Medical Record Number: [id number] Patient Account Number: [id number] Date of Birth/Sex: [birth year] (38 y.o. M) Treating RN: Rodell Perna Primary Care Krystyne Tewksbury: [name] Other Clinician: Referring Raianna Slight: [name] Treating Rosalene Wardrop/Extender: Linwood Dibbles, HOYT Weeks in Treatment: 6 Encounter Discharge Information Items Post Procedure Vitals Discharge Condition: Stable Temperature (F): 98.2 Ambulatory Status: Ambulatory Pulse (bpm): 82 Discharge Destination: Home Respiratory Rate  (breaths/min): 16 Transportation: Private Auto Blood Pressure (mmHg): 137/77 Accompanied By: self Schedule Follow-up Appointment: Yes Clinical Summary of Care: Electronic Signature(s) Signed: 12/03/2018 8:11:06 AM By: Rodell Perna Entered By: Rodell Perna on 11/22/2018 15:32:34 Martin Mullen ([id number]) -------------------------------------------------------------------------------- Lower Extremity Assessment Details Patient Name: Martin Mullen Date of Service: 11/22/2018 3:00 PM Medical Record Number: [id number] Patient Account Number: [id number] Date of Birth/Sex: Jan 01, 1981 (38 y.o. M) Treating RN: Curtis Sites Primary Care Aika Brzoska: [name] Other Clinician: Referring Antwain Caliendo: [name] Treating Jymir Dunaj/Extender: STONE III, HOYT Weeks in Treatment: 6 Edema Assessment Assessed: [Left: No] [Right: No] Edema: [Left: N] [Right: o] Vascular Assessment Pulses: Dorsalis Pedis Palpable: [Left:Yes] Electronic Signature(s) Signed: 11/22/2018 4:46:01 PM By: Curtis Sites Entered By: Curtis Sites on 11/22/2018 15:11:27 Martin Mullen ([id number]) -------------------------------------------------------------------------------- Multi Wound Chart Details Patient Name: Martin Mullen Date of Service: 11/22/2018 3:00 PM Medical Record Number: [id number] Patient Account Number: [id number] Date of Birth/Sex: 02-02-1981 (38 y.o. M) Treating RN: Rodell Perna Primary Care Warden Buffa: [name] Other Clinician: Referring Jasiah Elsen: [name] Treating Kamla Skilton/Extender: STONE III, HOYT Weeks in Treatment: 6 Vital Signs Height(in): 70 Pulse(bpm): 82 Weight(lbs): 230 Blood Pressure(mmHg): 137/77 Body Mass Index(BMI): 33 Temperature(F): 98.2 Respiratory Rate 16 (breaths/min): Photos: [N/A:N/A] Wound Location: Left Toe Great N/A N/A Wounding Event: Gradually Appeared N/A N/A Primary Etiology: Diabetic Wound/Ulcer of the N/A N/A Lower Extremity Comorbid History:  Coronary Artery Disease, N/A N/A Hypertension, Myocardial Infarction, Type II Diabetes Date Acquired: 08/30/2018 N/A N/A Weeks of Treatment: 6 N/A N/A Wound Status: Open N/A N/A Pending Amputation on Yes N/A N/A Presentation: Measurements L x W x D 0.1x0.2x0.3 N/A N/A (cm) Area (cm) : 0.016 N/A N/A Volume (cm) : 0.005 N/A N/A % Reduction in Area: -100.00% N/A N/A % Reduction in Volume: 0.00% N/A N/A Classification: Grade 1 N/A N/A Exudate Amount: Medium N/A N/A Exudate Type: Serous N/A N/A Exudate Color: amber N/A N/A Wound Margin: Flat and Intact N/A N/A Granulation Amount: Large (67-100%) N/A N/A Granulation Quality: Pink N/A N/A Necrotic Amount: None  Present (0%) N/A N/A Exposed Structures: Fat Layer (Subcutaneous N/A N/A Tissue) Exposed: Yes Fascia: No Martin Mullen, Martin Mullen ([id number]) Tendon: No Muscle: No Joint: No Bone: No Epithelialization: Medium (34-66%) N/A N/A Treatment Notes Electronic Signature(s) Signed: 12/03/2018 8:11:06 AM By: Army Melia Entered By: Army Melia on 11/22/2018 15:25:47 Martin Mullen ([id number]) -------------------------------------------------------------------------------- [place] Details Patient Name: Martin Mullen Date of Service: 11/22/2018 3:00 PM Medical Record Number: [id number] Patient Account Number: [id number] Date of Birth/Sex: 1980/08/03 (38 y.o. M) Treating RN: Army Melia Primary Care Kendle Erker: Webb Silversmith Other Clinician: Referring Leonides Minder: Webb Silversmith Treating Keiondra Brookover/Extender: Melburn Hake, HOYT Weeks in Treatment: 6 Active Inactive Nutrition Nursing Diagnoses: Potential for alteratiion in Nutrition/Potential for imbalanced nutrition Goals: Patient/caregiver verbalizes understanding of need to maintain therapeutic glucose control per primary care physician Date Initiated: 10/11/2018 Target Resolution Date: 10/25/2018 Goal Status: Active Interventions: Assess HgA1c results as ordered upon  admission and as needed Notes: Orientation to the Wound Care Program Nursing Diagnoses: Knowledge deficit related to the wound healing center program Goals: Patient/caregiver will verbalize understanding of the [place] Date Initiated: 10/11/2018 Target Resolution Date: 10/19/2018 Goal Status: Active Interventions: Provide education on orientation to the wound center Notes: Wound/Skin Impairment Nursing Diagnoses: Impaired tissue integrity Goals: Ulcer/skin breakdown will have a volume reduction of 30% by week 4 Date Initiated: 10/11/2018 Target Resolution Date: 10/19/2018 Goal Status: Active Interventions: Assess ulceration(s) every visit Martin Mullen, Martin Mullen ([id number]) Notes: Electronic Signature(s) Signed: 12/03/2018 8:11:06 AM By: Army Melia Entered By: Army Melia on 11/22/2018 15:25:38 Martin Mullen ([id number]) -------------------------------------------------------------------------------- Pain Assessment Details Patient Name: Martin Mullen Date of Service: 11/22/2018 3:00 PM Medical Record Number: [id number] Patient Account Number: [id number] Date of Birth/Sex: 1980/07/30 (38 y.o. M) Treating RN: Army Melia Primary Care Aldea Avis: Webb Silversmith Other Clinician: Referring Bruchy Mikel: Webb Silversmith Treating Shanele Nissan/Extender: Melburn Hake, HOYT Weeks in Treatment: 6 Active Problems Location of Pain Severity and Description of Pain Patient Has Paino No Site Locations Pain Management and Medication Current Pain Management: Electronic Signature(s) Signed: 11/22/2018 4:27:49 PM By: Lorine Bears RCP, RRT, CHT Signed: 12/03/2018 8:11:06 AM By: Army Melia Entered By: Lorine Bears on 11/22/2018 15:03:45 Martin Mullen ([id number]) -------------------------------------------------------------------------------- Patient/Caregiver Education Details Patient Name: Martin Mullen Date of Service: 11/22/2018 3:00 PM Medical Record  Number: [id number] Patient Account Number: [id number] Date of Birth/Gender: 1980/11/11 (38 y.o. M) Treating RN: Army Melia Primary Care Physician: Webb Silversmith Other Clinician: Referring Physician: Webb Silversmith Treating Physician/Extender: Sharalyn Ink in Treatment: 6 Education Assessment Education Provided To: Patient Education Topics Provided Wound/Skin Impairment: Handouts: Caring for Your Ulcer Methods: Demonstration, Explain/Verbal Responses: State content correctly Electronic Signature(s) Signed: 12/03/2018 8:11:06 AM By: Army Melia Entered By: Army Melia on 11/22/2018 15:31:55 Martin Mullen ([id number]) -------------------------------------------------------------------------------- Wound Assessment Details Patient Name: Martin Mullen Date of Service: 11/22/2018 3:00 PM Medical Record Number: [id number] Patient Account Number: [id number] Date of Birth/Sex: November 30, 1980 (38 y.o. M) Treating RN: Montey Hora Primary Care Jaggar Benko: Webb Silversmith Other Clinician: Referring Taniyah Ballow: Webb Silversmith Treating Dennis Killilea/Extender: STONE III, HOYT Weeks in Treatment: 6 Wound Status Wound Number: 1 Primary Diabetic Wound/Ulcer of the Lower Extremity Etiology: Wound Location: Left Toe Great Wound Open Wounding Event: Gradually Appeared Status: Date Acquired: 08/30/2018 Comorbid Coronary Artery Disease, Hypertension, Weeks Of Treatment: 6 History: Myocardial Infarction, Type II Diabetes Clustered Wound: No Pending Amputation On Presentation Photos Wound Measurements Length: (cm) 0.1 % Reduction in Width: (cm) 0.2 % Reduction in Depth: (cm) 0.3 Epithelializati Area: (cm) 0.016 Tunneling: Volume: (cm) 0.005 Undermining: Area: -100% Volume:  0% on: Medium (34-66%) No No Wound Description Classification: Grade 1 Foul Odor Afte Wound Margin: Flat and Intact Slough/Fibrino Exudate Amount: Medium Exudate Type: Serous Exudate Color: amber r Cleansing:  No No Wound Bed Granulation Amount: Large (67-100%) Exposed Structure Granulation Quality: Pink Fascia Exposed: No Necrotic Amount: None Present (0%) Fat Layer (Subcutaneous Tissue) Exposed: Yes Tendon Exposed: No Muscle Exposed: No Joint Exposed: No Bone Exposed: No Martin Mullen, Martin Mullen ([id number]) Electronic Signature(s) Signed: 11/22/2018 4:46:01 PM By: Curtis Sites Entered By: Curtis Sites on 11/22/2018 15:11:04 Martin Mullen ([id number]) -------------------------------------------------------------------------------- Vitals Details Patient Name: Martin Mullen Date of Service: 11/22/2018 3:00 PM Medical Record Number: [id number] Patient Account Number: [id number] Date of Birth/Sex: 1981-01-30 (38 y.o. M) Treating RN: Rodell Perna Primary Care Decarlos Empey: [name] Other Clinician: Referring Aleina Burgio: [name] Treating Keven Osborn/Extender: Linwood Dibbles, HOYT Weeks in Treatment: 6 Vital Signs Time Taken: 15:03 Temperature (F): 98.2 Height (in): 70 Pulse (bpm): 82 Weight (lbs): 230 Respiratory Rate (breaths/min): 16 Body Mass Index (BMI): 33 Blood Pressure (mmHg): 137/77 Reference Range: 80 - 120 mg / dl Electronic Signature(s) Signed: 11/22/2018 4:27:49 PM By: Dayton Martes RCP, RRT, CHT Entered By: Dayton Martes on 11/22/2018 15:04:51

## 2018-12-04 ENCOUNTER — Encounter: Payer: BC Managed Care – PPO | Admitting: Physician Assistant

## 2018-12-04 ENCOUNTER — Other Ambulatory Visit: Payer: Self-pay

## 2018-12-04 DIAGNOSIS — I2510 Atherosclerotic heart disease of native coronary artery without angina pectoris: Secondary | ICD-10-CM | POA: Diagnosis not present

## 2018-12-04 DIAGNOSIS — L03012 Cellulitis of left finger: Secondary | ICD-10-CM | POA: Diagnosis not present

## 2018-12-04 DIAGNOSIS — L03032 Cellulitis of left toe: Secondary | ICD-10-CM | POA: Diagnosis not present

## 2018-12-04 DIAGNOSIS — I25119 Atherosclerotic heart disease of native coronary artery with unspecified angina pectoris: Secondary | ICD-10-CM | POA: Diagnosis not present

## 2018-12-04 DIAGNOSIS — F17200 Nicotine dependence, unspecified, uncomplicated: Secondary | ICD-10-CM | POA: Diagnosis not present

## 2018-12-04 DIAGNOSIS — E11621 Type 2 diabetes mellitus with foot ulcer: Secondary | ICD-10-CM | POA: Diagnosis not present

## 2018-12-04 DIAGNOSIS — L97522 Non-pressure chronic ulcer of other part of left foot with fat layer exposed: Secondary | ICD-10-CM | POA: Diagnosis not present

## 2018-12-04 DIAGNOSIS — I10 Essential (primary) hypertension: Secondary | ICD-10-CM | POA: Diagnosis not present

## 2018-12-04 DIAGNOSIS — I252 Old myocardial infarction: Secondary | ICD-10-CM | POA: Diagnosis not present

## 2018-12-04 DIAGNOSIS — L539 Erythematous condition, unspecified: Secondary | ICD-10-CM | POA: Diagnosis not present

## 2018-12-04 NOTE — Progress Notes (Addendum)
Martin, Mullen ([id number]) Visit Report for 12/04/2018 Chief Complaint Document Details Patient Name: Martin Mullen, Martin Mullen Date of Service: 12/04/2018 8:00 AM Medical Record Number: [id number] Patient Account Number: [id number] Date of Birth/Sex: 1980/11/07 (38 y.o. M) Treating RN: Curtis Sites Primary Care Provider: [name] Other Clinician: Referring Provider: [name] Treating Provider/Extender: Linwood Dibbles, HOYT Weeks in Treatment: 7 Information Obtained from: Patient Chief Complaint left great toe ulcer Electronic Signature(s) Signed: 12/04/2018 8:23:16 AM By: Lenda Kelp PA-C Entered By: Lenda Kelp on 12/04/2018 08:23:16 Martin Mullen, Martin Mullen ([id number]) -------------------------------------------------------------------------------- Debridement Details Patient Name: Martin Mullen Date of Service: 12/04/2018 8:00 AM Medical Record Number: [id number] Patient Account Number: [id number] Date of Birth/Sex: 1980/04/14 (38 y.o. M) Treating RN: Curtis Sites Primary Care Provider: [name] Other Clinician: Referring Provider: [name] Treating Provider/Extender: Linwood Dibbles, HOYT Weeks in Treatment: 7 Debridement Performed for Wound #1 Left Toe Great Assessment: Performed By: Physician STONE III, HOYT E., PA-C Debridement Type: Debridement Severity of Tissue Pre Fat layer exposed Debridement: Level of Consciousness (Pre- Awake and Alert procedure): Pre-procedure Verification/Time Yes - 08:56 Out Taken: Start Time: 08:56 Pain Control: Lidocaine 4% Topical Solution Total Area Debrided (L x W): 0.1 (cm) x 0.1 (cm) = 0.01 (cm) Tissue and other material Viable, Non-Viable, Callus, Slough, Subcutaneous, Slough debrided: Level: Skin/Subcutaneous Tissue Debridement Description: Excisional Instrument: Curette Bleeding: Minimum Hemostasis Achieved: Pressure End Time: 08:59 Procedural Pain: 0 Post Procedural Pain: 0 Response to Treatment: Procedure was  tolerated well Level of Consciousness Awake and Alert (Post-procedure): Post Debridement Measurements of Total Wound Length: (cm) 0.3 Width: (cm) 0.3 Depth: (cm) 0.4 Volume: (cm) 0.028 Character of Wound/Ulcer Post Debridement: Improved Severity of Tissue Post Debridement: Fat layer exposed Post Procedure Diagnosis Same as Pre-procedure Electronic Signature(s) Signed: 12/04/2018 4:44:40 PM By: Curtis Sites Signed: 12/04/2018 7:13:08 PM By: Lenda Kelp PA-C Entered By: Curtis Sites on 12/04/2018 08:57:51 Martin Mullen ([id number]) -------------------------------------------------------------------------------- HPI Details Patient Name: Martin Mullen Date of Service: 12/04/2018 8:00 AM Medical Record Number: [id number] Patient Account Number: [id number] Date of Birth/Sex: January 15, 1981 (38 y.o. M) Treating RN: Curtis Sites Primary Care Provider: [name] Other Clinician: Referring Provider: [name] Treating Provider/Extender: Linwood Dibbles, HOYT Weeks in Treatment: 7 History of Present Illness HPI Description: 10/11/18 patient presents today for initial evaluation our clinic. He is being seen due to a left great toe ulcer which unfortunately has been present for quite some time. Initially he thought it really wasn't much of anything and then subsequently noted that he had some drainage then that seem to dry off and he didn't worry too much about it until more recently when he is had issues. 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 Subsequently he tells me he has not utilized any recreational drugs for two years. This is obviously excellent news. He also does have diabetes type II. It does sound like he is trying to improve things as far as his overall health status is concerned although his hemoglobin A-1 C as measured on 09/27/18 was 10.7. He did have an  x-ray as well on 09/28/18 and this was negative for any evidence of acute or chronic osteomyelitis or fracture. Patient's wound is not appear to be severely infected at this time which is good news. With that being said he also doesn't really have any pain this is secondary to neuropathy. No fevers, chills, nausea, or vomiting noted at this  time. 10/18/2018 on evaluation today patient actually appears to be doing better with regard to his toe ulcer. He has been tolerating the dressing changes without complication. Fortunately there is no signs of active infection at this time. Overall very pleased in this regard and how things are doing. Patient likewise is pleased and happy to see that things are doing much better. Still he does have some ways to go to get this area to completely close. 10/30/2018 on evaluation today patient's ulcer on his plantar foot actually appears to be doing quite well at this time. He has been tolerating the dressing changes without complication which is excellent news. With that being said I do believe he is making progress there is some callus buildup on without the clear away today but other than that I am seeing evidence that this seems to be filling in from the base and is getting much smaller in the base. Still were not at the point of complete closure yet. 11/09/2018 on evaluation today patient actually appears to be doing quite well with regard to his toe ulcer from the standpoint of infection. I do not see any signs of active infection at this time which is good news. With that being said he unfortunately is not really making much progress either he continues to have depth of the wound despite the fact that it seems to be callused over initially on inspection when I probed and actually remove some of the callus it is much deeper. Fortunately there is no signs of active infection at this time. 11/16/2018 on evaluation today patient's toe ulcer actually appears to be doing  about the same. There is really no significant improvement compared to last time I saw him. There is some debridement necessary today he has callus covering the wound area I think he may be a good candidate for total contact cast to rather rapidly get this area to close. We previously have discussed this although we have not initiated it yet I think it may be time to do so and go ahead and get this closed. 11/22/2018 on evaluation today patient appears to be doing better with regard to the overall size of his wound. He has been tolerating the dressing changes without complication. Fortunately there is no signs of active infection at this time. No fevers, chills, nausea, vomiting, or diarrhea. 11/29/2018 on evaluation today patient appears to be doing about the same really with regard to his toe ulcer. He is not showing any signs of dramatic improvement unfortunately. There is no signs of infection at this time. No fevers, chills, nausea, vomiting, or diarrhea. I really think we need to consider the total contact cast at this point in order to go ahead and get this closed. 12/04/2018 on evaluation today patient appears to be doing about the same with regard to his great toe ulcer. He is here today for the initiation of the total contact cast. Unfortunately he notes he did clip his toenail somewhat short on the second toe of the same foot he does have a slight paronychia noted at this point. There is some erythema is not spreading into the foot if just in the distal portion of the toe there is a little bit of evidence of fluid/pus buildup at the toenail margin. Nonetheless Martin JeffersonOLIVER, Rondo ([id number]) this is something that I definitely think we probably should treat just to make sure nothing gets worse while we apply the cast he still wants to apply the cast today we will  be seeing him back in 2 days anyway to reapply the cast so I do not see any problems in that regard. Electronic  Signature(s) Signed: 12/04/2018 9:02:29 AM By: Lenda KelpStone III, Hoyt PA-C Entered By: Lenda KelpStone III, Hoyt on 12/04/2018 09:02:29 Martin JeffersonOLIVER, Nikai ([id number]) -------------------------------------------------------------------------------- Physical Exam Details Patient Name: Martin Mullen, Martin Mullen Date of Service: 12/04/2018 8:00 AM Medical Record Number: [id number] Patient Account Number: [id number] Date of Birth/Sex: 06/22/1980 (38 y.o. M) Treating RN: Curtis Sitesorthy, Joanna Primary Care Provider: Nicki ReaperBAITY, REGINA Other Clinician: Referring Provider: Nicki ReaperBAITY, REGINA Treating Provider/Extender: STONE III, HOYT Weeks in Treatment: 7 Constitutional Well-nourished and well-hydrated in no acute distress. Respiratory normal breathing without difficulty. clear to auscultation bilaterally. Cardiovascular regular rate and rhythm with normal S1, S2. Psychiatric this patient is able to make decisions and demonstrates good insight into disease process. Alert and Oriented x 3. pleasant and cooperative. Notes Upon inspection today patient's wound did require sharp debridement to remove callus and slough from the base of the wound and the surrounding wound tissue. He tolerated that today without complication and post debridement wound bed appears to be doing much better which is great news. In regard to the second toe I did use a 18-gauge needle to go just underneath the edge of his nail bed in order to release the fluid that was pressurized and built up in the tissue there. It drained appropriately and appears to be doing better I think this will help with the healing process as well. With that being said there is no signs of infection systemically nor spreading into the foot I think were okay to put the cast on since we will be seeing her back in 2 days to ensure nothing is worsening. Electronic Signature(s) Signed: 12/04/2018 9:03:18 AM By: Lenda KelpStone III, Hoyt PA-C Entered By: Lenda KelpStone III, Hoyt on 12/04/2018 09:03:18 Martin JeffersonOLIVER, Martin Mullen  ([id number]) -------------------------------------------------------------------------------- Physician Orders Details Patient Name: Martin Mullen, Martin Mullen Date of Service: 12/04/2018 8:00 AM Medical Record Number: [id number] Patient Account Number: [id number] Date of Birth/Sex: 06/22/1980 (38 y.o. M) Treating RN: Curtis Sitesorthy, Joanna Primary Care Provider: Nicki ReaperBAITY, REGINA Other Clinician: Referring Provider: Nicki ReaperBAITY, REGINA Treating Provider/Extender: Linwood DibblesSTONE III, HOYT Weeks in Treatment: 7 Verbal / Phone Orders: No Diagnosis Coding ICD-10 Coding Code Description E11.621 Type 2 diabetes mellitus with foot ulcer L97.522 Non-pressure chronic ulcer of other part of left foot with fat layer exposed I10 Essential (primary) hypertension I25.10 Atherosclerotic heart disease of native coronary artery without angina pectoris Wound Cleansing Wound #1 Left Toe Great o Clean wound with Normal Saline. - in office o Dial antibacterial soap, wash wounds, rinse and pat dry prior to dressing wounds Skin Barriers/Peri-Wound Care o Other: - murpiocin ointment to left second toe Primary Wound Dressing Wound #1 Left Toe Great o Martin Mullen Collagen Secondary Dressing Wound #1 Left Toe Great o Foam Dressing Change Frequency Wound #1 Left Toe Great o Change dressing every week Follow-up Appointments Wound #1 Left Toe Great o Return Appointment in 1 week. - Thursday 12/06/18 Off-Loading Wound #1 Left Toe Great o Total Contact Cast to Left Lower Extremity Patient Medications Allergies: No Known Drug Allergies Notifications Medication Indication Start End Bactrim DS 12/04/2018 DOSE 1 - oral 800 mg-160 mg tablet - 1 tablet oral taken 2 times a day for 10 days Martin JeffersonOLIVER, Babe ([id number]) Electronic Signature(s) Signed: 12/04/2018 9:06:08 AM By: Lenda KelpStone III, Hoyt PA-C Entered By: Lenda KelpStone III, Hoyt on 12/04/2018 09:06:08 Martin Mullen, Martin Mullen  ([id number]) -------------------------------------------------------------------------------- Problem List Details Patient Name: Martin Mullen, Pattrick Date of Service: 12/04/2018 8:00 AM Medical Record Number: [id number]  Patient Account Number: [id number] Date of Birth/Sex: [birth year] (38 y.o. M) Treating RN: Curtis Sites Primary Care Provider: [name] Other Clinician: Referring Provider: [name] Treating Provider/Extender: Linwood Dibbles, HOYT Weeks in Treatment: 7 Active Problems ICD-10 Evaluated Encounter Code Description Active Date Today Diagnosis E11.621 Type 2 diabetes mellitus with foot ulcer 10/11/2018 No Yes L97.522 Non-pressure chronic ulcer of other part of left foot with fat 10/11/2018 No Yes layer exposed L03.032 Cellulitis of left toe 12/04/2018 No Yes I10 Essential (primary) hypertension 10/11/2018 No Yes I25.10 Atherosclerotic heart disease of native coronary artery 10/11/2018 No Yes without angina pectoris Inactive Problems Resolved Problems Electronic Signature(s) Signed: 12/04/2018 9:04:50 AM By: Lenda Kelp PA-C Previous Signature: 12/04/2018 8:23:08 AM Version By: Lenda Kelp PA-C Entered By: Lenda Kelp on 12/04/2018 09:04:50 Martin Mullen ([id number]) -------------------------------------------------------------------------------- Progress Note Details Patient Name: Martin Mullen Date of Service: 12/04/2018 8:00 AM Medical Record Number: [id number] Patient Account Number: [id number] Date of Birth/Sex: Mar 10, 1980 (38 y.o. M) Treating RN: Curtis Sites Primary Care Provider: [name] Other Clinician: Referring Provider: [name] Treating Provider/Extender: Linwood Dibbles, HOYT Weeks in Treatment: 7 Subjective Chief Complaint Information obtained from Patient left great toe ulcer History of Present Illness (HPI) 10/11/18 patient presents today for initial evaluation our clinic. He is being seen due to a left great toe ulcer which  unfortunately has been present for quite some time. Initially he thought it really wasn't much of anything and then subsequently noted that he had some drainage then that seem to dry off and he didn't worry too much about it until more recently when he is had issues. 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 Subsequently he tells me he has not utilized any recreational drugs for two years. This is obviously excellent news. He also does have diabetes type II. It does sound like he is trying to improve things as far as his overall health status is concerned although his hemoglobin A-1 C as measured on 09/27/18 was 10.7. He did have an x-ray as well on 09/28/18 and this was negative for any evidence of acute or chronic osteomyelitis or fracture. Patient's wound is not appear to be severely infected at this time which is good news. With that being said he also doesn't really have any pain this is secondary to neuropathy. No fevers, chills, nausea, or vomiting noted at this time. 10/18/2018 on evaluation today patient actually appears to be doing better with regard to his toe ulcer. He has been tolerating the dressing changes without complication. Fortunately there is no signs of active infection at this time. Overall very pleased in this regard and how things are doing. Patient likewise is pleased and happy to see that things are doing much better. Still he does have some ways to go to get this area to completely close. 10/30/2018 on evaluation today patient's ulcer on his plantar foot actually appears to be doing quite well at this time. He has been tolerating the dressing changes without complication which is excellent news. With that being said I do believe he is making progress there is some callus buildup on without the clear away today but other than that I am seeing evidence that this  seems to be filling in from the base and is getting much smaller in the base. Still were not at the point of complete closure yet. 11/09/2018 on evaluation today  patient actually appears to be doing quite well with regard to his toe ulcer from the standpoint of infection. I do not see any signs of active infection at this time which is good news. With that being said he unfortunately is not really making much progress either he continues to have depth of the wound despite the fact that it seems to be callused over initially on inspection when I probed and actually remove some of the callus it is much deeper. Fortunately there is no signs of active infection at this time. 11/16/2018 on evaluation today patient's toe ulcer actually appears to be doing about the same. There is really no significant improvement compared to last time I saw him. There is some debridement necessary today he has callus covering the wound area I think he may be a good candidate for total contact cast to rather rapidly get this area to close. We previously have discussed this although we have not initiated it yet I think it may be time to do so and go ahead and get this closed. 11/22/2018 on evaluation today patient appears to be doing better with regard to the overall size of his wound. He has been tolerating the dressing changes without complication. Fortunately there is no signs of active infection at this time. No fevers, chills, nausea, vomiting, or diarrhea. 11/29/2018 on evaluation today patient appears to be doing about the same really with regard to his toe ulcer. He is not showing any signs of dramatic improvement unfortunately. There is no signs of infection at this time. No fevers, chills, nausea, vomiting, or diarrhea. I really think we need to consider the total contact cast at this point in order to go ahead and get this closed. Martin Mullen, Martin Mullen ([id number]) 12/04/2018 on evaluation today patient appears to be  doing about the same with regard to his great toe ulcer. He is here today for the initiation of the total contact cast. Unfortunately he notes he did clip his toenail somewhat short on the second toe of the same foot he does have a slight paronychia noted at this point. There is some erythema is not spreading into the foot if just in the distal portion of the toe there is a little bit of evidence of fluid/pus buildup at the toenail margin. Nonetheless this is something that I definitely think we probably should treat just to make sure nothing gets worse while we apply the cast he still wants to apply the cast today we will be seeing him back in 2 days anyway to reapply the cast so I do not see any problems in that regard. Patient History Information obtained from Patient. Family History Diabetes - Mother,Father,Paternal Grandparents,Maternal Grandparents, No family history of Cancer, Heart Disease, Hereditary Spherocytosis, Hypertension, Kidney Disease, Lung Disease, Seizures, Stroke, Thyroid Problems, Tuberculosis. Social History Current every day smoker, Marital Status - Single, Alcohol Use - Rarely, Drug Use - Prior History, Caffeine Use - Daily. Medical History Cardiovascular Patient has history of Coronary Artery Disease, Hypertension, Myocardial Infarction Denies history of Angina, Arrhythmia, Congestive Heart Failure, Deep Vein Thrombosis, Hypotension, Peripheral Arterial Disease, Peripheral Venous Disease, Phlebitis, Vasculitis Endocrine Patient has history of Type II Diabetes Denies history of Type I Diabetes Integumentary (Skin) Denies history of History of Burn, History of pressure wounds Medical And Surgical History Notes Cardiovascular HLD Review of Systems (ROS) Constitutional Symptoms (General Health) Denies complaints or symptoms of Fatigue, Fever, Chills, Marked Weight Change. Respiratory Denies complaints or symptoms of Chronic or frequent coughs, Shortness  of  Breath. Cardiovascular Denies complaints or symptoms of Chest pain, LE edema. Psychiatric Denies complaints or symptoms of Anxiety, Claustrophobia. Objective Constitutional Well-nourished and well-hydrated in no acute distress. Martin Mullen, Martin Mullen ([id number]) Vitals Time Taken: 8:25 AM, Height: 70 in, Weight: 230 lbs, BMI: 33, Temperature: 97.9 F, Pulse: 88 bpm, Respiratory Rate: 18 breaths/min, Blood Pressure: 141/93 mmHg. Respiratory normal breathing without difficulty. clear to auscultation bilaterally. Cardiovascular regular rate and rhythm with normal S1, S2. Psychiatric this patient is able to make decisions and demonstrates good insight into disease process. Alert and Oriented x 3. pleasant and cooperative. General Notes: Upon inspection today patient's wound did require sharp debridement to remove callus and slough from the base of the wound and the surrounding wound tissue. He tolerated that today without complication and post debridement wound bed appears to be doing much better which is great news. In regard to the second toe I did use a 18-gauge needle to go just underneath the edge of his nail bed in order to release the fluid that was pressurized and built up in the tissue there. It drained appropriately and appears to be doing better I think this will help with the healing process as well. With that being said there is no signs of infection systemically nor spreading into the foot I think were okay to put the cast on since we will be seeing her back in 2 days to ensure nothing is worsening. Integumentary (Hair, Skin) Wound #1 status is Open. Original cause of wound was Gradually Appeared. The wound is located on the Left Toe Great. The wound measures 0.1cm length x 0.1cm width x 0.5cm depth; 0.008cm^2 area and 0.004cm^3 volume. There is Fat Layer (Subcutaneous Tissue) Exposed exposed. There is no tunneling or undermining noted. There is a medium amount of serous drainage  noted. The wound margin is flat and intact. There is large (67-100%) pink granulation within the wound bed. There is no necrotic tissue within the wound bed. Assessment Active Problems ICD-10 Type 2 diabetes mellitus with foot ulcer Non-pressure chronic ulcer of other part of left foot with fat layer exposed Cellulitis of left toe Essential (primary) hypertension Atherosclerotic heart disease of native coronary artery without angina pectoris Procedures Wound #1 Pre-procedure diagnosis of Wound #1 is a Diabetic Wound/Ulcer of the Lower Extremity located on the Left Toe Great .Severity of Tissue Pre Debridement is: Fat layer exposed. There was a Excisional Skin/Subcutaneous Tissue Debridement with a total area of 0.01 sq cm performed by STONE III, HOYT E., PA-C. With the following instrument(s): Curette to remove Viable and Non-Viable tissue/material. Material removed includes Callus, Subcutaneous Tissue, and Slough after achieving pain control using Lidocaine 4% Topical Solution. No specimens were taken. A time out was conducted at 08:56, prior to the start of the procedure. A Minimum amount of bleeding was controlled with Pressure. The Martin Mullen, Martin Mullen ([id number]) procedure was tolerated well with a pain level of 0 throughout and a pain level of 0 following the procedure. Post Debridement Measurements: 0.3cm length x 0.3cm width x 0.4cm depth; 0.028cm^3 volume. Character of Wound/Ulcer Post Debridement is improved. Severity of Tissue Post Debridement is: Fat layer exposed. Post procedure Diagnosis Wound #1: Same as Pre-Procedure Pre-procedure diagnosis of Wound #1 is a Diabetic Wound/Ulcer of the Lower Extremity located on the Left Toe Great . There was a Total Contact Cast Procedure by STONE III, HOYT E., PA-C. Post procedure Diagnosis Wound #1: Same as Pre-Procedure Plan Wound Cleansing: Wound #1 Left Toe Great: Clean wound with  Normal Saline. - in office Dial antibacterial soap, wash  wounds, rinse and pat dry prior to dressing wounds Skin Barriers/Peri-Wound Care: Other: - murpiocin ointment to left second toe Primary Wound Dressing: Wound #1 Left Toe Great: Martin Mullen Collagen Secondary Dressing: Wound #1 Left Toe Great: Foam Dressing Change Frequency: Wound #1 Left Toe Great: Change dressing every week Follow-up Appointments: Wound #1 Left Toe Great: Return Appointment in 1 week. - Thursday 12/06/18 Off-Loading: Wound #1 Left Toe Great: Total Contact Cast to Left Lower Extremity The following medication(s) was prescribed: Bactrim DS oral 800 mg-160 mg tablet 1 1 tablet oral taken 2 times a day for 10 days starting 12/04/2018 1. My suggestion currently is good to be that we go ahead and initiate the total contact cast which the patient is in agreement with at this time as well. 2. We will also go ahead and add mupirocin to the paronychia area on the second toe. I am also going to place him on Bactrim DS as well he states he does well with this I think that is a very good option. There really was nothing to culture at this time. 3. I am going to go ahead and switch to [facility] for the toe ulcer hopefully this will help this area to heal and more appropriately and quickly as well. 4. We will see the patient back for a 2-day check to reapply the cast and ensure there is no complications at that point. We will see patient back for reevaluation in 1 week here in the clinic. If anything worsens or changes patient will contact our office for additional recommendations. Martin Mullen, Martin Mullen ([id number]) Electronic Signature(s) Signed: 12/04/2018 9:06:41 AM By: Worthy Keeler PA-C Entered By: Worthy Keeler on 12/04/2018 09:06:41 CAIDAN, HUBBERT ([id number]) -------------------------------------------------------------------------------- ROS/PFSH Details Patient Name: [name] Date of Service: 12/04/2018 8:00 AM Medical Record Number: [id number] Patient Account Number:  [id number] Date of Birth/Sex: Jun 24, 1980 (38 y.o. M) Treating RN: Montey Hora Primary Care Provider: Webb Silversmith Other Clinician: Referring Provider: Webb Silversmith Treating Provider/Extender: Melburn Hake, HOYT Weeks in Treatment: 7 Information Obtained From Patient Constitutional Symptoms (General Health) Complaints and Symptoms: Negative for: Fatigue; Fever; Chills; Marked Weight Change Respiratory Complaints and Symptoms: Negative for: Chronic or frequent coughs; Shortness of Breath Cardiovascular Complaints and Symptoms: Negative for: Chest pain; LE edema Medical History: Positive for: Coronary Artery Disease; Hypertension; Myocardial Infarction Negative for: Angina; Arrhythmia; Congestive Heart Failure; Deep Vein Thrombosis; Hypotension; Peripheral Arterial Disease; Peripheral Venous Disease; Phlebitis; Vasculitis Past Medical History Notes: HLD Psychiatric Complaints and Symptoms: Negative for: Anxiety; Claustrophobia Endocrine Medical History: Positive for: Type II Diabetes Negative for: Type I Diabetes Treated with: Oral agents Integumentary (Skin) Medical History: Negative for: History of Burn; History of pressure wounds Immunizations Pneumococcal Vaccine: Received Pneumococcal Vaccination: Yes Implantable Devices None RILLEY, STASH ([id number]) Family and Social History Cancer: No; Diabetes: Yes - Mother,Father,Paternal Grandparents,Maternal Grandparents; Heart Disease: No; Hereditary Spherocytosis: No; Hypertension: No; Kidney Disease: No; Lung Disease: No; Seizures: No; Stroke: No; Thyroid Problems: No; Tuberculosis: No; Current every day smoker; Marital Status - Single; Alcohol Use: Rarely; Drug Use: Prior History; Caffeine Use: Daily; Financial Concerns: No; Food, Clothing or Shelter Needs: No; Support System Lacking: No; Transportation Concerns: No Physician Affirmation I have reviewed and agree with the above information. Electronic  Signature(s) Signed: 12/04/2018 4:44:40 PM By: Montey Hora Signed: 12/04/2018 7:13:08 PM By: Worthy Keeler PA-C Entered By: Worthy Keeler on 12/04/2018 09:02:42 [name] ([id number]) -------------------------------------------------------------------------------- Total Contact Cast Details Patient Name:  Martin Mullen Date of Service: 12/04/2018 8:00 AM Medical Record Number: [id number] Patient Account Number: [id number] Date of Birth/Sex: 1980-09-24 (38 y.o. M) Treating RN: Curtis Sites Primary Care Provider: [name] Other Clinician: Referring Provider: [name] Treating Provider/Extender: Linwood Dibbles, HOYT Weeks in Treatment: 7 Total Contact Cast Applied for Wound Assessment: Wound #1 Left Toe Great Performed By: Physician Trellis Paganini., PA-C Post Procedure Diagnosis Same as Pre-procedure Electronic Signature(s) Signed: 12/04/2018 4:44:40 PM By: Curtis Sites Signed: 12/04/2018 7:13:08 PM By: Lenda Kelp PA-C Entered By: Curtis Sites on 12/04/2018 08:59:49 ADLER, CHARTRAND ([id number]) -------------------------------------------------------------------------------- SuperBill Details Patient Name: Martin Mullen Date of Service: 12/04/2018 Medical Record Number: [id number] Patient Account Number: [id number] Date of Birth/Sex: [birth year] (38 y.o. M) Treating RN: Curtis Sites Primary Care Provider: [name] Other Clinician: Referring Provider: [name] Treating Provider/Extender: Linwood Dibbles, HOYT Weeks in Treatment: 7 Diagnosis Coding ICD-10 Codes Code Description E11.621 Type 2 diabetes mellitus with foot ulcer L97.522 Non-pressure chronic ulcer of other part of left foot with fat layer exposed L03.032 Cellulitis of left toe I10 Essential (primary) hypertension I25.10 Atherosclerotic heart disease of native coronary artery without angina pectoris Facility Procedures CPT4 Code: [id number] Description: 11042 - DEB SUBQ TISSUE 20 SQ CM/<  ICD-10 Diagnosis Description L97.522 Non-pressure chronic ulcer of other part of left foot with fat Modifier: layer exposed Quantity: 1 Physician Procedures CPT4 Code: 2595638 Description: 99214 - WC PHYS LEVEL 4 - EST PT ICD-10 Diagnosis Description E11.621 Type 2 diabetes mellitus with foot ulcer L97.522 Non-pressure chronic ulcer of other part of left foot with fat L03.032 Cellulitis of left toe I10 Essential (primary)  hypertension Modifier: 25 layer exposed Quantity: 1 CPT4 Code: 7564332 Description: 11042 - WC PHYS SUBQ TISS 20 SQ CM ICD-10 Diagnosis Description L97.522 Non-pressure chronic ulcer of other part of left foot with fat Modifier: layer exposed Quantity: 1 Electronic Signature(s) Signed: 12/04/2018 9:06:57 AM By: Lenda Kelp PA-C Entered By: Lenda Kelp on 12/04/2018 09:06:56

## 2018-12-06 ENCOUNTER — Other Ambulatory Visit: Payer: Self-pay

## 2018-12-06 ENCOUNTER — Encounter: Payer: BC Managed Care – PPO | Admitting: Physician Assistant

## 2018-12-06 DIAGNOSIS — I25119 Atherosclerotic heart disease of native coronary artery with unspecified angina pectoris: Secondary | ICD-10-CM | POA: Diagnosis not present

## 2018-12-06 DIAGNOSIS — F17200 Nicotine dependence, unspecified, uncomplicated: Secondary | ICD-10-CM | POA: Diagnosis not present

## 2018-12-06 DIAGNOSIS — I10 Essential (primary) hypertension: Secondary | ICD-10-CM | POA: Diagnosis not present

## 2018-12-06 DIAGNOSIS — I2510 Atherosclerotic heart disease of native coronary artery without angina pectoris: Secondary | ICD-10-CM | POA: Diagnosis not present

## 2018-12-06 DIAGNOSIS — L03032 Cellulitis of left toe: Secondary | ICD-10-CM | POA: Diagnosis not present

## 2018-12-06 DIAGNOSIS — L97522 Non-pressure chronic ulcer of other part of left foot with fat layer exposed: Secondary | ICD-10-CM | POA: Diagnosis not present

## 2018-12-06 DIAGNOSIS — E11621 Type 2 diabetes mellitus with foot ulcer: Secondary | ICD-10-CM | POA: Diagnosis not present

## 2018-12-06 DIAGNOSIS — I252 Old myocardial infarction: Secondary | ICD-10-CM | POA: Diagnosis not present

## 2018-12-06 NOTE — Progress Notes (Addendum)
Martin Mullen, [place] ([id number]) Visit Report for 12/06/2018 Chief Complaint Document Details Patient Name: Martin Mullen, Martin Mullen Date of Service: 12/06/2018 8:15 AM Medical Record Number: [id number] Patient Account Number: [id number] Date of Birth/Sex: Jun 14, 1980 (38 y.o. M) Treating RN: Rodell PernaScott, Dajea Primary Care Provider: Nicki ReaperBAITY, REGINA Other Clinician: Referring Provider: Nicki ReaperBAITY, REGINA Treating Provider/Extender: Linwood DibblesSTONE III, HOYT Weeks in Treatment: 8 Information Obtained from: Patient Chief Complaint left great toe ulcer Electronic Signature(s) Signed: 12/06/2018 8:36:32 AM By: Lenda KelpStone III, Hoyt PA-C Entered By: Lenda KelpStone III, Hoyt on 12/06/2018 08:36:32 Martin Mullen, Martin Mullen ([id number]) -------------------------------------------------------------------------------- Debridement Details Patient Name: Martin Mullen, Martin Mullen Date of Service: 12/06/2018 8:15 AM Medical Record Number: [id number] Patient Account Number: [id number] Date of Birth/Sex: Jun 14, 1980 (38 y.o. M) Treating RN: Rodell PernaScott, Dajea Primary Care Provider: Nicki ReaperBAITY, REGINA Other Clinician: Referring Provider: Nicki ReaperBAITY, REGINA Treating Provider/Extender: Linwood DibblesSTONE III, HOYT Weeks in Treatment: 8 Debridement Performed for Wound #1 Left Toe Great Assessment: Performed By: Physician STONE III, HOYT E., PA-C Debridement Type: Debridement Severity of Tissue Pre Fat layer exposed Debridement: Level of Consciousness (Pre- Awake and Alert procedure): Pre-procedure Verification/Time Yes - 08:43 Out Taken: Start Time: 08:44 Pain Control: Lidocaine Total Area Debrided (L x W): 0.4 (cm) x 0.5 (cm) = 0.2 (cm) Tissue and other material Non-Viable, Callus, Slough, Subcutaneous, Slough debrided: Level: Skin/Subcutaneous Tissue Debridement Description: Excisional Instrument: Curette Bleeding: Minimum Hemostasis Achieved: Pressure End Time: 08:45 Procedural Pain: 1 Post Procedural Pain: 0 Response to Treatment: Procedure was tolerated well Level of  Consciousness Awake and Alert (Post-procedure): Post Debridement Measurements of Total Wound Length: (cm) 0.4 Width: (cm) 0.5 Depth: (cm) 0.3 Volume: (cm) 0.047 Character of Wound/Ulcer Post Debridement: Stable Severity of Tissue Post Debridement: Fat layer exposed Post Procedure Diagnosis Same as Pre-procedure Electronic Signature(s) Signed: 12/06/2018 6:34:56 PM By: Lenda KelpStone III, Hoyt PA-C Signed: 12/07/2018 10:17:05 AM By: Rodell PernaScott, Dajea Previous Signature: 12/06/2018 1:28:54 PM Version By: Rodell PernaScott, Dajea Entered By: Lenda KelpStone III, Hoyt on 12/06/2018 18:34:56 Martin Mullen, Martin Mullen ([id number]) -------------------------------------------------------------------------------- HPI Details Patient Name: Martin Mullen, Martin Mullen Date of Service: 12/06/2018 8:15 AM Medical Record Number: [id number] Patient Account Number: [id number] Date of Birth/Sex: Jun 14, 1980 (38 y.o. M) Treating RN: Rodell PernaScott, Dajea Primary Care Provider: Nicki ReaperBAITY, REGINA Other Clinician: Referring Provider: Nicki ReaperBAITY, REGINA Treating Provider/Extender: Linwood DibblesSTONE III, HOYT Weeks in Treatment: 8 History of Present Illness HPI Description: 10/11/18 patient presents today for initial evaluation our clinic. He is being seen due to a left great toe ulcer which unfortunately has been present for quite some time. Initially he thought it really wasn't much of anything and then subsequently noted that he had some drainage then that seem to dry off and he didn't worry too much about it until more recently when he is had issues. 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 Subsequently he tells me he has not utilized any recreational drugs for two years. This is obviously excellent news. He also does have diabetes type II. It does sound like he is trying to improve things as far as his overall health status is concerned although his hemoglobin A-1 C as measured  on 09/27/18 was 10.7. He did have an x-ray as well on 09/28/18 and this was negative for any evidence of acute or chronic osteomyelitis or fracture. Patient's wound is not appear to be severely infected at this time which is good news. With that being said he also doesn't really have any pain this is secondary to neuropathy. No fevers, chills,  nausea, or vomiting noted at this time. 10/18/2018 on evaluation today patient actually appears to be doing better with regard to his toe ulcer. He has been tolerating the dressing changes without complication. Fortunately there is no signs of active infection at this time. Overall very pleased in this regard and how things are doing. Patient likewise is pleased and happy to see that things are doing much better. Still he does have some ways to go to get this area to completely close. 10/30/2018 on evaluation today patient's ulcer on his plantar foot actually appears to be doing quite well at this time. He has been tolerating the dressing changes without complication which is excellent news. With that being said I do believe he is making progress there is some callus buildup on without the clear away today but other than that I am seeing evidence that this seems to be filling in from the base and is getting much smaller in the base. Still were not at the point of complete closure yet. 11/09/2018 on evaluation today patient actually appears to be doing quite well with regard to his toe ulcer from the standpoint of infection. I do not see any signs of active infection at this time which is good news. With that being said he unfortunately is not really making much progress either he continues to have depth of the wound despite the fact that it seems to be callused over initially on inspection when I probed and actually remove some of the callus it is much deeper. Fortunately there is no signs of active infection at this time. 11/16/2018 on evaluation today patient's  toe ulcer actually appears to be doing about the same. There is really no significant improvement compared to last time I saw him. There is some debridement necessary today he has callus covering the wound area I think he may be a good candidate for total contact cast to rather rapidly get this area to close. We previously have discussed this although we have not initiated it yet I think it may be time to do so and go ahead and get this closed. 11/22/2018 on evaluation today patient appears to be doing better with regard to the overall size of his wound. He has been tolerating the dressing changes without complication. Fortunately there is no signs of active infection at this time. No fevers, chills, nausea, vomiting, or diarrhea. 11/29/2018 on evaluation today patient appears to be doing about the same really with regard to his toe ulcer. He is not showing any signs of dramatic improvement unfortunately. There is no signs of infection at this time. No fevers, chills, nausea, vomiting, or diarrhea. I really think we need to consider the total contact cast at this point in order to go ahead and get this closed. 12/04/2018 on evaluation today patient appears to be doing about the same with regard to his great toe ulcer. He is here today for the initiation of the total contact cast. Unfortunately he notes he did clip his toenail somewhat short on the second toe of the same foot he does have a slight paronychia noted at this point. There is some erythema is not spreading into the foot if just in the distal portion of the toe there is a little bit of evidence of fluid/pus buildup at the toenail margin. Nonetheless ARMOND, CUTHRELL ([id number]) this is something that I definitely think we probably should treat just to make sure nothing gets worse while we apply the cast he still wants to  apply the cast today we will be seeing him back in 2 days anyway to reapply the cast so I do not see any problems in that  regard. 12/06/2018 on evaluation today patient appears to be measuring smaller with regard to his wound. The total contact cast just in 2 days seems to have already helped he is here for repeat application today. Fortunately there is no signs of active infection at this time. No fevers, chills, nausea, vomiting, or diarrhea. He did unfortunately get the cast wet this morning taking a shower he attempted to use a trash bag and it failed. I believe he should get a cast protector which would be better as far as helping to avoid this from happening again. Electronic Signature(s) Signed: 12/06/2018 6:31:44 PM By: Lenda Kelp PA-C Entered By: Lenda Kelp on 12/06/2018 18:31:44 Martin, Mullen ([id number]) -------------------------------------------------------------------------------- Physical Exam Details Patient Name: Martin Mullen Date of Service: 12/06/2018 8:15 AM Medical Record Number: [id number] Patient Account Number: [id number] Date of Birth/Sex: 1980/04/06 (38 y.o. M) Treating RN: Rodell Perna Primary Care Provider: [name] Other Clinician: Referring Provider: [name] Treating Provider/Extender: STONE III, HOYT Weeks in Treatment: 8 Constitutional Well-nourished and well-hydrated in no acute distress. Respiratory normal breathing without difficulty. Psychiatric this patient is able to make decisions and demonstrates good insight into disease process. Alert and Oriented x 3. pleasant and cooperative. Notes Patient's wound bed again did show some signs of callus buildup I did have to perform some debridement to clear away some of the necrotic tissue and callus from the surface of the wound. He tolerated that today without complication post debridement the wound bed appears to be doing significantly better. We then did apply collagen to the wound bed and then subsequently reapply the total contact cast today as well. Electronic Signature(s) Signed: 12/06/2018  6:32:08 PM By: Lenda Kelp PA-C Entered By: Lenda Kelp on 12/06/2018 18:32:08 Martin, Mullen ([id number]) -------------------------------------------------------------------------------- Physician Orders Details Patient Name: Martin Mullen Date of Service: 12/06/2018 8:15 AM Medical Record Number: [id number] Patient Account Number: [id number] Date of Birth/Sex: July 15, 1980 (38 y.o. M) Treating RN: Rodell Perna Primary Care Provider: [name] Other Clinician: Referring Provider: [name] Treating Provider/Extender: Linwood Dibbles, HOYT Weeks in Treatment: 8 Verbal / Phone Orders: No Diagnosis Coding ICD-10 Coding Code Description E11.621 Type 2 diabetes mellitus with foot ulcer L97.522 Non-pressure chronic ulcer of other part of left foot with fat layer exposed L03.032 Cellulitis of left toe I10 Essential (primary) hypertension I25.10 Atherosclerotic heart disease of native coronary artery without angina pectoris Wound Cleansing Wound #1 Left Toe Great o Clean wound with Normal Saline. - in office o Dial antibacterial soap, wash wounds, rinse and pat dry prior to dressing wounds Primary Wound Dressing Wound #1 Left Toe Great o Silver Collagen Secondary Dressing Wound #1 Left Toe Great o Foam Dressing Change Frequency Wound #1 Left Toe Great o Change dressing every week Follow-up Appointments Wound #1 Left Toe Great o Return Appointment in 1 week. Off-Loading Wound #1 Left Toe Great o Total Contact Cast to Left Lower Extremity Electronic Signature(s) Signed: 12/06/2018 1:28:54 PM By: Rodell Perna Signed: 12/06/2018 6:36:47 PM By: Lenda Kelp PA-C Entered By: Rodell Perna on 12/06/2018 08:46:12 Martin, Mullen ([id number]) -------------------------------------------------------------------------------- Problem List Details Patient Name: Martin Mullen Date of Service: 12/06/2018 8:15 AM Medical Record Number: [id number] Patient Account  Number: [id number] Date of Birth/Sex: [birth year] (38 y.o. M) Treating RN: Rodell Perna Primary Care Provider: [name] Other Clinician: Referring Provider: Sampson Si  REGINA Treating Provider/Extender: STONE III, HOYT Weeks in Treatment: 8 Active Problems ICD-10 Evaluated Encounter Code Description Active Date Today Diagnosis E11.621 Type 2 diabetes mellitus with foot ulcer 10/11/2018 No Yes L97.522 Non-pressure chronic ulcer of other part of left foot with fat 10/11/2018 No Yes layer exposed L03.032 Cellulitis of left toe 12/04/2018 No Yes I10 Essential (primary) hypertension 10/11/2018 No Yes I25.10 Atherosclerotic heart disease of native coronary artery 10/11/2018 No Yes without angina pectoris Inactive Problems Resolved Problems Electronic Signature(s) Signed: 12/06/2018 8:36:27 AM By: Lenda Kelp PA-C Entered By: Lenda Kelp on 12/06/2018 08:36:26 Martin Mullen ([id number]) -------------------------------------------------------------------------------- Progress Note Details Patient Name: Martin Mullen Date of Service: 12/06/2018 8:15 AM Medical Record Number: [id number] Patient Account Number: [id number] Date of Birth/Sex: 1980-06-22 (38 y.o. M) Treating RN: Rodell Perna Primary Care Provider: [name] Other Clinician: Referring Provider: [name] Treating Provider/Extender: Linwood Dibbles, HOYT Weeks in Treatment: 8 Subjective Chief Complaint Information obtained from Patient left great toe ulcer History of Present Illness (HPI) 10/11/18 patient presents today for initial evaluation our clinic. He is being seen due to a left great toe ulcer which unfortunately has been present for quite some time. Initially he thought it really wasn't much of anything and then subsequently noted that he had some drainage then that seem to dry off and he didn't worry too much about it until more recently when he is had issues. 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 Subsequently he tells me he has not utilized any recreational drugs for two years. This is obviously excellent news. He also does have diabetes type II. It does sound like he is trying to improve things as far as his overall health status is concerned although his hemoglobin A-1 C as measured on 09/27/18 was 10.7. He did have an x-ray as well on 09/28/18 and this was negative for any evidence of acute or chronic osteomyelitis or fracture. Patient's wound is not appear to be severely infected at this time which is good news. With that being said he also doesn't really have any pain this is secondary to neuropathy. No fevers, chills, nausea, or vomiting noted at this time. 10/18/2018 on evaluation today patient actually appears to be doing better with regard to his toe ulcer. He has been tolerating the dressing changes without complication. Fortunately there is no signs of active infection at this time. Overall very pleased in this regard and how things are doing. Patient likewise is pleased and happy to see that things are doing much better. Still he does have some ways to go to get this area to completely close. 10/30/2018 on evaluation today patient's ulcer on his plantar foot actually appears to be doing quite well at this time. He has been tolerating the dressing changes without complication which is excellent news. With that being said I do believe he is making progress there is some callus buildup on without the clear away today but other than that I am seeing evidence that this seems to be filling in from the base and is getting much smaller in the base. Still were not at the point of complete closure yet. 11/09/2018 on evaluation today patient actually appears to be doing quite well with regard to his toe ulcer from the standpoint of infection. I do not see any signs of active infection at  this time which is good news. With  that being said he unfortunately is not really making much progress either he continues to have depth of the wound despite the fact that it seems to be callused over initially on inspection when I probed and actually remove some of the callus it is much deeper. Fortunately there is no signs of active infection at this time. 11/16/2018 on evaluation today patient's toe ulcer actually appears to be doing about the same. There is really no significant improvement compared to last time I saw him. There is some debridement necessary today he has callus covering the wound area I think he may be a good candidate for total contact cast to rather rapidly get this area to close. We previously have discussed this although we have not initiated it yet I think it may be time to do so and go ahead and get this closed. 11/22/2018 on evaluation today patient appears to be doing better with regard to the overall size of his wound. He has been tolerating the dressing changes without complication. Fortunately there is no signs of active infection at this time. No fevers, chills, nausea, vomiting, or diarrhea. 11/29/2018 on evaluation today patient appears to be doing about the same really with regard to his toe ulcer. He is not showing any signs of dramatic improvement unfortunately. There is no signs of infection at this time. No fevers, chills, nausea, vomiting, or diarrhea. I really think we need to consider the total contact cast at this point in order to go ahead and get this closed. Martin Mullen, Martin Mullen ([id number]) 12/04/2018 on evaluation today patient appears to be doing about the same with regard to his great toe ulcer. He is here today for the initiation of the total contact cast. Unfortunately he notes he did clip his toenail somewhat short on the second toe of the same foot he does have a slight paronychia noted at this point. There is some erythema is not spreading into  the foot if just in the distal portion of the toe there is a little bit of evidence of fluid/pus buildup at the toenail margin. Nonetheless this is something that I definitely think we probably should treat just to make sure nothing gets worse while we apply the cast he still wants to apply the cast today we will be seeing him back in 2 days anyway to reapply the cast so I do not see any problems in that regard. 12/06/2018 on evaluation today patient appears to be measuring smaller with regard to his wound. The total contact cast just in 2 days seems to have already helped he is here for repeat application today. Fortunately there is no signs of active infection at this time. No fevers, chills, nausea, vomiting, or diarrhea. He did unfortunately get the cast wet this morning taking a shower he attempted to use a trash bag and it failed. I believe he should get a cast protector which would be better as far as helping to avoid this from happening again. Patient History Information obtained from Patient. Family History Diabetes - Mother,Father,Paternal Grandparents,Maternal Grandparents, No family history of Cancer, Heart Disease, Hereditary Spherocytosis, Hypertension, Kidney Disease, Lung Disease, Seizures, Stroke, Thyroid Problems, Tuberculosis. Social History Current every day smoker, Marital Status - Single, Alcohol Use - Rarely, Drug Use - Prior History, Caffeine Use - Daily. Medical History Cardiovascular Patient has history of Coronary Artery Disease, Hypertension, Myocardial Infarction Denies history of Angina, Arrhythmia, Congestive Heart Failure, Deep Vein Thrombosis, Hypotension, Peripheral Arterial Disease, Peripheral Venous Disease, Phlebitis, Vasculitis Endocrine Patient  has history of Type II Diabetes Denies history of Type I Diabetes Integumentary (Skin) Denies history of History of Burn, History of pressure wounds Medical And Surgical History  Notes Cardiovascular HLD Review of Systems (ROS) Constitutional Symptoms (General Health) Denies complaints or symptoms of Fatigue, Fever, Chills, Marked Weight Change. Respiratory Denies complaints or symptoms of Chronic or frequent coughs, Shortness of Breath. Cardiovascular Denies complaints or symptoms of Chest pain, LE edema. Psychiatric Denies complaints or symptoms of Anxiety, Claustrophobia. Martin, Mullen ([id number]) Objective Constitutional Well-nourished and well-hydrated in no acute distress. Vitals Time Taken: 8:24 AM, Height: 70 in, Weight: 230 lbs, BMI: 33, Temperature: 97.9 F, Pulse: 79 bpm, Respiratory Rate: 18 breaths/min, Blood Pressure: 109/65 mmHg. Respiratory normal breathing without difficulty. Psychiatric this patient is able to make decisions and demonstrates good insight into disease process. Alert and Oriented x 3. pleasant and cooperative. General Notes: Patient's wound bed again did show some signs of callus buildup I did have to perform some debridement to clear away some of the necrotic tissue and callus from the surface of the wound. He tolerated that today without complication post debridement the wound bed appears to be doing significantly better. We then did apply collagen to the wound bed and then subsequently reapply the total contact cast today as well. Integumentary (Hair, Skin) Wound #1 status is Open. Original cause of wound was Gradually Appeared. The wound is located on the Left Toe Great. The wound measures 0.4cm length x 0.5cm width x 0.3cm depth; 0.157cm^2 area and 0.047cm^3 volume. There is Fat Layer (Subcutaneous Tissue) Exposed exposed. There is no tunneling noted, however, there is undermining starting at 12:00 and ending at 12:00 with a maximum distance of 0.3cm. There is a medium amount of serous drainage noted. The wound margin is flat and intact. There is large (67-100%) pink granulation within the wound bed. There is no necrotic  tissue within the wound bed. Assessment Active Problems ICD-10 Type 2 diabetes mellitus with foot ulcer Non-pressure chronic ulcer of other part of left foot with fat layer exposed Cellulitis of left toe Essential (primary) hypertension Atherosclerotic heart disease of native coronary artery without angina pectoris Procedures Wound #1 Pre-procedure diagnosis of Wound #1 is a Diabetic Wound/Ulcer of the Lower Extremity located on the Left Toe Great .Severity of Tissue Pre Debridement is: Fat layer exposed. There was a Excisional Skin/Subcutaneous Tissue Debridement with a total area of 0.2 sq cm performed by STONE III, HOYT E., PA-C. With the following instrument(s): Curette to remove Non-Viable tissue/material. Material removed includes Callus, Subcutaneous Tissue, and Slough after achieving pain control using Lidocaine. A time out was conducted at 08:43, prior to the start of the procedure. A Minimum amount of Mullen, Martin ([id number]) bleeding was controlled with Pressure. The procedure was tolerated well with a pain level of 1 throughout and a pain level of 0 following the procedure. Post Debridement Measurements: 0.4cm length x 0.5cm width x 0.3cm depth; 0.047cm^3 volume. Character of Wound/Ulcer Post Debridement is stable. Severity of Tissue Post Debridement is: Fat layer exposed. Post procedure Diagnosis Wound #1: Same as Pre-Procedure Pre-procedure diagnosis of Wound #1 is a Diabetic Wound/Ulcer of the Lower Extremity located on the Left Toe Great . There was a Total Contact Cast Procedure by STONE III, HOYT E., PA-C. Post procedure Diagnosis Wound #1: Same as Pre-Procedure Plan Wound Cleansing: Wound #1 Left Toe Great: Clean wound with Normal Saline. - in office Dial antibacterial soap, wash wounds, rinse and pat dry prior to dressing wounds Primary  Wound Dressing: Wound #1 Left Toe Great: Silver Collagen Secondary Dressing: Wound #1 Left Toe Great: Foam Dressing Change  Frequency: Wound #1 Left Toe Great: Change dressing every week Follow-up Appointments: Wound #1 Left Toe Great: Return Appointment in 1 week. Off-Loading: Wound #1 Left Toe Great: Total Contact Cast to Left Lower Extremity 1. I would recommend that we continue with the silver collagen for the time being underneath the cast and we did reapply the cast I performed that myself today. 2. We will plan to see him back for reevaluation next week to remove the cast to evaluate the wound and see where things stand. Already I am seeing evidence of improvement hopefully he will be doing much better by that time. We will see patient back for reevaluation in 1 week here in the clinic. If anything worsens or changes patient will contact our office for additional recommendations. Electronic Signature(s) Signed: 12/06/2018 6:35:17 PM By: Lenda Kelp PA-C Previous Signature: 12/06/2018 6:32:59 PM Version By: Lenda Kelp PA-C Entered By: Lenda Kelp on 12/06/2018 18:35:16 Martin Mullen, EID ([id number]) -------------------------------------------------------------------------------- ROS/PFSH Details Patient Name: Martin Mullen Date of Service: 12/06/2018 8:15 AM Medical Record Number: [id number] Patient Account Number: [id number] Date of Birth/Sex: May 29, 1980 (38 y.o. M) Treating RN: Rodell Perna Primary Care Provider: [name] Other Clinician: Referring Provider: [name] Treating Provider/Extender: Linwood Dibbles, HOYT Weeks in Treatment: 8 Information Obtained From Patient Constitutional Symptoms (General Health) Complaints and Symptoms: Negative for: Fatigue; Fever; Chills; Marked Weight Change Respiratory Complaints and Symptoms: Negative for: Chronic or frequent coughs; Shortness of Breath Cardiovascular Complaints and Symptoms: Negative for: Chest pain; LE edema Medical History: Positive for: Coronary Artery Disease; Hypertension; Myocardial Infarction Negative for:  Angina; Arrhythmia; Congestive Heart Failure; Deep Vein Thrombosis; Hypotension; Peripheral Arterial Disease; Peripheral Venous Disease; Phlebitis; Vasculitis Past Medical History Notes: HLD Psychiatric Complaints and Symptoms: Negative for: Anxiety; Claustrophobia Endocrine Medical History: Positive for: Type II Diabetes Negative for: Type I Diabetes Treated with: Oral agents Integumentary (Skin) Medical History: Negative for: History of Burn; History of pressure wounds Immunizations Pneumococcal Vaccine: Received Pneumococcal Vaccination: Yes Implantable Devices None JULIS, HAUBNER ([id number]) Family and Social History Cancer: No; Diabetes: Yes - Mother,Father,Paternal Grandparents,Maternal Grandparents; Heart Disease: No; Hereditary Spherocytosis: No; Hypertension: No; Kidney Disease: No; Lung Disease: No; Seizures: No; Stroke: No; Thyroid Problems: No; Tuberculosis: No; Current every day smoker; Marital Status - Single; Alcohol Use: Rarely; Drug Use: Prior History; Caffeine Use: Daily; Financial Concerns: No; Food, Clothing or Shelter Needs: No; Support System Lacking: No; Transportation Concerns: No Physician Affirmation I have reviewed and agree with the above information. Electronic Signature(s) Signed: 12/06/2018 6:36:47 PM By: Lenda Kelp PA-C Signed: 12/07/2018 10:17:05 AM By: Rodell Perna Entered By: Lenda Kelp on 12/06/2018 18:31:56 ED, [place] ([id number]) -------------------------------------------------------------------------------- Total Contact Cast Details Patient Name: Martin Mullen Date of Service: 12/06/2018 8:15 AM Medical Record Number: [id number] Patient Account Number: [id number] Date of Birth/Sex: 1980-10-12 (38 y.o. M) Treating RN: Rodell Perna Primary Care Provider: [name] Other Clinician: Referring Provider: [name] Treating Provider/Extender: Linwood Dibbles, HOYT Weeks in Treatment: 8 Total Contact Cast Applied for Wound  Assessment: Wound #1 Left Toe Great Performed By: Physician Trellis Paganini., PA-C Post Procedure Diagnosis Same as Pre-procedure Electronic Signature(s) Signed: 12/06/2018 1:28:54 PM By: Rodell Perna Signed: 12/06/2018 6:36:47 PM By: Lenda Kelp PA-C Entered By: Rodell Perna on 12/06/2018 08:45:05 Martin Mullen ([id number]) -------------------------------------------------------------------------------- SuperBill Details Patient Name: Martin Mullen Date of Service: 12/06/2018 Medical Record Number: [id number] Patient  Account Number: [id number] Date of Birth/Sex: Oct 28, 1980 (38 y.o. M) Treating RN: Army Melia Primary Care Provider: Webb Silversmith Other Clinician: Referring Provider: Webb Silversmith Treating Provider/Extender: Melburn Hake, HOYT Weeks in Treatment: 8 Diagnosis Coding ICD-10 Codes Code Description E11.621 Type 2 diabetes mellitus with foot ulcer L97.522 Non-pressure chronic ulcer of other part of left foot with fat layer exposed L03.032 Cellulitis of left toe I10 Essential (primary) hypertension I25.10 Atherosclerotic heart disease of native coronary artery without angina pectoris Facility Procedures CPT4 Code: 88416606 Description: 30160 - DEB SUBQ TISSUE 20 SQ CM/< ICD-10 Diagnosis Description L97.522 Non-pressure chronic ulcer of other part of left foot with fat Modifier: layer exposed Quantity: 1 Physician Procedures CPT4 Code: 1093235 Description: 11042 - WC PHYS SUBQ TISS 20 SQ CM ICD-10 Diagnosis Description L97.522 Non-pressure chronic ulcer of other part of left foot with fat Modifier: layer exposed Quantity: 1 Electronic Signature(s) Signed: 12/06/2018 6:35:43 PM By: Worthy Keeler PA-C Previous Signature: 12/06/2018 6:33:26 PM Version By: Worthy Keeler PA-C Entered By: Worthy Keeler on 12/06/2018 18:35:42

## 2018-12-08 NOTE — Progress Notes (Signed)
HARBERT, FITTERER ([id number]) Visit Report for 12/06/2018 Arrival Information Details Patient Name: Martin Mullen, Martin Mullen Date of Service: 12/06/2018 8:15 AM Medical Record Number: [id number] Patient Account Number: [id number] Date of Birth/Sex: [birth year] (38 y.o. M) Treating RN: Huel Coventry Primary Care Anush Wiedeman: [name] Other Clinician: Referring Kandise Riehle: [name] Treating Donae Kueker/Extender: Linwood Dibbles, HOYT Weeks in Treatment: 8 Visit Information History Since Last Visit Added or deleted any medications: No Patient Arrived: Ambulatory Any new allergies or adverse reactions: No Arrival Time: 08:23 Had a fall or experienced change in No Accompanied By: self activities of daily living that may affect Transfer Assistance: None risk of falls: Patient Identification Verified: Yes Signs or symptoms of abuse/neglect since last visito No Secondary Verification Process Yes Hospitalized since last visit: No Completed: Implantable device outside of the clinic excluding No Patient Has Alerts: Yes cellular tissue based products placed in the center Patient Alerts: Patient on Blood since last visit: Thinner Pain Present Now: No 81mg  aspirin Electronic Signature(s) Signed: 12/07/2018 5:23:28 PM By: 12/09/2018, BSN, RN, CWS, Kim RN, BSN Entered By: Elliot Gurney, BSN, RN, CWS, Kim on 12/06/2018 08:24:06 12/08/2018 ([name]) -------------------------------------------------------------------------------- Encounter Discharge Information Details Patient Name: [id number] Date of Service: 12/06/2018 8:15 AM Medical Record Number: 12/08/2018 Patient Account Number: [id number] Date of Birth/Sex: 1980-11-11 (38 y.o. M) Treating RN: 08-11-1984 Primary Care Cledith Abdou: Rodell Perna Other Clinician: Referring Cyris Maalouf: [name] Treating Eufemio Strahm/Extender: [name], HOYT Weeks in Treatment: 8 Encounter Discharge Information Items Post Procedure Vitals Discharge Condition:  Stable Temperature (F): 97.9 Ambulatory Status: Ambulatory Pulse (bpm): 79 Discharge Destination: Home Respiratory Rate (breaths/min): 16 Transportation: Private Auto Blood Pressure (mmHg): 109/65 Accompanied By: self Schedule Follow-up Appointment: Yes Clinical Summary of Care: Electronic Signature(s) Signed: 12/06/2018 1:28:54 PM By: 12/08/2018 Entered By: Rodell Perna on 12/06/2018 08:47:12 12/08/2018 ([name]) -------------------------------------------------------------------------------- Lower Extremity Assessment Details Patient Name: [id number] Date of Service: 12/06/2018 8:15 AM Medical Record Number: 12/08/2018 Patient Account Number: [id number] Date of Birth/Sex: 1980-11-20 (38 y.o. M) Treating RN: 08-11-1984 Primary Care Brittney Mucha: Huel Coventry Other Clinician: Referring Teneisha Gignac: [name] Treating Taivon Haroon/Extender: [name], HOYT Weeks in Treatment: 8 Vascular Assessment Pulses: Dorsalis Pedis Palpable: [Left:Yes] Electronic Signature(s) Signed: 12/07/2018 5:23:28 PM By: 12/09/2018, BSN, RN, CWS, Kim RN, BSN Entered By: Elliot Gurney, BSN, RN, CWS, Kim on 12/06/2018 08:36:01 ANANTH, FIALLOS ([name]) -------------------------------------------------------------------------------- Multi Wound Chart Details Patient Name: [id number] Date of Service: 12/06/2018 8:15 AM Medical Record Number: 12/08/2018 Patient Account Number: [id number] Date of Birth/Sex: 1981-01-11 (38 y.o. M) Treating RN: 08-11-1984 Primary Care Tamari Redwine: Rodell Perna Other Clinician: Referring Felesha Moncrieffe: [name] Treating Hammond Obeirne/Extender: STONE III, HOYT Weeks in Treatment: 8 Vital Signs Height(in): 70 Pulse(bpm): 79 Weight(lbs): 230 Blood Pressure(mmHg): 109/65 Body Mass Index(BMI): 33 Temperature(F): 97.9 Respiratory Rate 18 (breaths/min): Photos: [N/A:N/A] Wound Location: Left Toe Great N/A N/A Wounding Event: Gradually Appeared N/A N/A Primary Etiology:  Diabetic Wound/Ulcer of the N/A N/A Lower Extremity Comorbid History: Coronary Artery Disease, N/A N/A Hypertension, Myocardial Infarction, Type II Diabetes Date Acquired: 08/30/2018 N/A N/A Weeks of Treatment: 8 N/A N/A Wound Status: Open N/A N/A Pending Amputation on Yes N/A N/A Presentation: Measurements L x W x D 0.4x0.5x0.3 N/A N/A (cm) Area (cm) : 0.157 N/A N/A Volume (cm) : 0.047 N/A N/A % Reduction in Area: -1862.50% N/A N/A % Reduction in Volume: -840.00% N/A N/A Starting Position 1 12 (o'clock): Ending Position 1 12 (o'clock): Maximum Distance 1 (cm): 0.3 Undermining: Yes N/A N/A Classification: Grade 1 N/A N/A Exudate Amount: Medium N/A  N/A Exudate Type: Serous N/A N/A Exudate Color: amber N/A N/A Wound Margin: Flat and Intact N/A N/A JUSTINO, BOZE ([id number]) Granulation Amount: Large (67-100%) N/A N/A Granulation Quality: Pink N/A N/A Necrotic Amount: None Present (0%) N/A N/A Exposed Structures: Fat Layer (Subcutaneous N/A N/A Tissue) Exposed: Yes Fascia: No Tendon: No Muscle: No Joint: No Bone: No Epithelialization: Small (1-33%) N/A N/A Treatment Notes Electronic Signature(s) Signed: 12/06/2018 1:28:54 PM By: Army Melia Entered By: Army Melia on 12/06/2018 08:41:07 [name] ([id number]) -------------------------------------------------------------------------------- [place] Details Patient Name: [name] Date of Service: 12/06/2018 8:15 AM Medical Record Number: [id number] Patient Account Number: [id number] Date of Birth/Sex: September 30, 1980 (38 y.o. M) Treating RN: Army Melia Primary Care Petra Sargeant: Webb Silversmith Other Clinician: Referring Cataldo Cosgriff: Webb Silversmith Treating Tahjae Durr/Extender: Melburn Hake, HOYT Weeks in Treatment: 8 Active Inactive Nutrition Nursing Diagnoses: Potential for alteratiion in Nutrition/Potential for imbalanced nutrition Goals: Patient/caregiver verbalizes understanding of need to  maintain therapeutic glucose control per primary care physician Date Initiated: 10/11/2018 Target Resolution Date: 10/25/2018 Goal Status: Active Interventions: Assess HgA1c results as ordered upon admission and as needed Notes: Orientation to the Wound Care Program Nursing Diagnoses: Knowledge deficit related to the wound healing center program Goals: Patient/caregiver will verbalize understanding of the [facility] Date Initiated: 10/11/2018 Target Resolution Date: 10/19/2018 Goal Status: Active Interventions: Provide education on orientation to the wound center Notes: Wound/Skin Impairment Nursing Diagnoses: Impaired tissue integrity Goals: Ulcer/skin breakdown will have a volume reduction of 30% by week 4 Date Initiated: 10/11/2018 Target Resolution Date: 10/19/2018 Goal Status: Active Interventions: Assess ulceration(s) every visit TYE, VIGO ([id number]) Notes: Electronic Signature(s) Signed: 12/06/2018 1:28:54 PM By: Army Melia Entered By: Army Melia on 12/06/2018 08:40:58 [name] ([id number]) -------------------------------------------------------------------------------- Pain Assessment Details Patient Name: [name] Date of Service: 12/06/2018 8:15 AM Medical Record Number: [id number] Patient Account Number: [id number] Date of Birth/Sex: 1980-05-04 (38 y.o. M) Treating RN: Cornell Barman Primary Care Berlinda Farve: Webb Silversmith Other Clinician: Referring Judythe Postema: Webb Silversmith Treating Khrystian Schauf/Extender: Melburn Hake, HOYT Weeks in Treatment: 8 Active Problems Location of Pain Severity and Description of Pain Patient Has Paino No Site Locations Pain Management and Medication Current Pain Management: Electronic Signature(s) Signed: 12/07/2018 5:23:28 PM By: Gretta Cool, BSN, RN, CWS, Kim RN, BSN Entered By: Gretta Cool, BSN, RN, CWS, Kim on 12/06/2018 08:24:11 [name]  ([id number]) -------------------------------------------------------------------------------- Patient/Caregiver Education Details Patient Name: [name] Date of Service: 12/06/2018 8:15 AM Medical Record Number: [id number] Patient Account Number: [id number] Date of Birth/Gender: 11/26/1980 (38 y.o. M) Treating RN: Army Melia Primary Care Physician: Webb Silversmith Other Clinician: Referring Physician: Webb Silversmith Treating Physician/Extender: Sharalyn Ink in Treatment: 8 Education Assessment Education Provided To: Patient Education Topics Provided Wound/Skin Impairment: Handouts: Caring for Your Ulcer Methods: Demonstration, Explain/Verbal Responses: State content correctly Electronic Signature(s) Signed: 12/06/2018 1:28:54 PM By: Army Melia Entered By: Army Melia on 12/06/2018 08:46:27 [name] ([id number]) -------------------------------------------------------------------------------- Wound Assessment Details Patient Name: [name] Date of Service: 12/06/2018 8:15 AM Medical Record Number: [id number] Patient Account Number: [id number] Date of Birth/Sex: [birth year] (38 y.o. M) Treating RN: Cornell Barman Primary Care Zaria Taha: Webb Silversmith Other Clinician: Referring Caydin Yeatts: Webb Silversmith Treating Linkyn Gobin/Extender: STONE III, HOYT Weeks in Treatment: 8 Wound Status Wound Number: 1 Primary Diabetic Wound/Ulcer of the Lower Extremity Etiology: Wound Location: Left Toe Great Wound Open Wounding Event: Gradually Appeared Status: Date Acquired: 08/30/2018 Comorbid Coronary Artery Disease, Hypertension, Weeks Of Treatment: 8 History: Myocardial Infarction, Type II Diabetes Clustered Wound: No Pending Amputation On Presentation Photos Wound  Measurements Length: (cm) 0.4 % Reduction in Width: (cm) 0.5 % Reduction in Depth: (cm) 0.3 Epithelializat Area: (cm) 0.157 Tunneling: Volume: (cm) 0.047 Undermining: Starting Po Ending Posi Maximum  Dis Area: -1862.5% Volume: -840% ion: Small (1-33%) No Yes sition (o'clock): 12 tion (o'clock): 12 tance: (cm) 0.3 Wound Description Classification: Grade 1 Foul Odor Afte Wound Margin: Flat and Intact Slough/Fibrino Exudate Amount: Medium Exudate Type: Serous Exudate Color: amber r Cleansing: No No Wound Bed Granulation Amount: Large (67-100%) Exposed Structure Granulation Quality: Pink Fascia Exposed: No Necrotic Amount: None Present (0%) Fat Layer (Subcutaneous Tissue) Exposed: Yes Tendon Exposed: No Muscle Exposed: No JOHNANDREW, HUCKER ([id number]) Joint Exposed: No Bone Exposed: No Treatment Notes Wound #1 (Left Toe Great) Notes prisma, foam and TCC Electronic Signature(s) Signed: 12/07/2018 5:23:28 PM By: Elliot Gurney, BSN, RN, CWS, Kim RN, BSN Entered By: Elliot Gurney, BSN, RN, CWS, Kim on 12/06/2018 08:34:22 [name] ([id number]) -------------------------------------------------------------------------------- Vitals Details Patient Name: [name] Date of Service: 12/06/2018 8:15 AM Medical Record Number: [id number] Patient Account Number: [id number] Date of Birth/Sex: Apr 06, 1980 (38 y.o. M) Treating RN: Huel Coventry Primary Care Markelle Asaro: [name] Other Clinician: Referring Nasiyah Laverdiere: [name] Treating Nichael Ehly/Extender: Linwood Dibbles, HOYT Weeks in Treatment: 8 Vital Signs Time Taken: 08:24 Temperature (F): 97.9 Height (in): 70 Pulse (bpm): 79 Weight (lbs): 230 Respiratory Rate (breaths/min): 18 Body Mass Index (BMI): 33 Blood Pressure (mmHg): 109/65 Reference Range: 80 - 120 mg / dl Electronic Signature(s) Signed: 12/07/2018 5:23:28 PM By: Elliot Gurney, BSN, RN, CWS, Kim RN, BSN Entered By: Elliot Gurney, BSN, RN, CWS, Kim on 12/06/2018 [phone]

## 2018-12-13 ENCOUNTER — Other Ambulatory Visit: Payer: Self-pay

## 2018-12-13 ENCOUNTER — Encounter: Payer: BC Managed Care – PPO | Admitting: Internal Medicine

## 2018-12-13 DIAGNOSIS — F17200 Nicotine dependence, unspecified, uncomplicated: Secondary | ICD-10-CM | POA: Diagnosis not present

## 2018-12-13 DIAGNOSIS — I2510 Atherosclerotic heart disease of native coronary artery without angina pectoris: Secondary | ICD-10-CM | POA: Diagnosis not present

## 2018-12-13 DIAGNOSIS — L03032 Cellulitis of left toe: Secondary | ICD-10-CM | POA: Diagnosis not present

## 2018-12-13 DIAGNOSIS — I25119 Atherosclerotic heart disease of native coronary artery with unspecified angina pectoris: Secondary | ICD-10-CM | POA: Diagnosis not present

## 2018-12-13 DIAGNOSIS — I10 Essential (primary) hypertension: Secondary | ICD-10-CM | POA: Diagnosis not present

## 2018-12-13 DIAGNOSIS — L97522 Non-pressure chronic ulcer of other part of left foot with fat layer exposed: Secondary | ICD-10-CM | POA: Diagnosis not present

## 2018-12-13 DIAGNOSIS — I252 Old myocardial infarction: Secondary | ICD-10-CM | POA: Diagnosis not present

## 2018-12-13 DIAGNOSIS — E11621 Type 2 diabetes mellitus with foot ulcer: Secondary | ICD-10-CM | POA: Diagnosis not present

## 2018-12-14 NOTE — Progress Notes (Signed)
Martin Mullen, Martin Mullen ([id number]) Visit Report for 12/13/2018 Debridement Details Patient Name: Martin Mullen, Martin Mullen Date of Service: 12/13/2018 4:00 PM Medical Record Number: [id number] Patient Account Number: [id number] Date of Birth/Sex: [birth year] (38 y.o. M) Treating RN: Rodell PernaScott, Dajea Primary Care Provider: Nicki ReaperBAITY, REGINA Other Clinician: Referring Provider: Nicki ReaperBAITY, REGINA Treating Provider/Extender: Altamese CarolinaOBSON, Adalid Beckmann G Weeks in Treatment: 9 Debridement Performed for Wound #1 Left Toe Great Assessment: Performed By: Physician Maxwell CaulOBSON, Maaz Spiering G, MD Debridement Type: Debridement Severity of Tissue Pre Limited to breakdown of skin Debridement: Level of Consciousness (Pre- Awake and Alert procedure): Pre-procedure Verification/Time Yes - 16:17 Out Taken: Start Time: 16:18 Pain Control: Lidocaine Total Area Debrided (L x W): 0.2 (cm) x 0.2 (cm) = 0.04 (cm) Tissue and other material Non-Viable, Callus, Skin: Dermis debrided: Level: Skin/Dermis Debridement Description: Selective/Open Wound Instrument: Curette Bleeding: None End Time: 16:19 Response to Treatment: Procedure was tolerated well Level of Consciousness Awake and Alert (Post-procedure): Post Debridement Measurements of Total Wound Length: (cm) 0.2 Width: (cm) 0.2 Depth: (cm) 0.1 Volume: (cm) 0.003 Character of Wound/Ulcer Post Debridement: Stable Severity of Tissue Post Debridement: Fat layer exposed Post Procedure Diagnosis Same as Pre-procedure Electronic Signature(s) Signed: 12/13/2018 5:39:42 PM By: Baltazar Najjarobson, Markon Jares MD Signed: 12/14/2018 9:12:39 AM By: Rodell PernaScott, Dajea Entered By: Baltazar Najjarobson, Lucciano Vitali on 12/13/2018 17:19:14 Martin Mullen, Martin Mullen ([id number]) -------------------------------------------------------------------------------- HPI Details Patient Name: Martin Mullen, Martin Mullen Date of Service: 12/13/2018 4:00 PM Medical Record Number: [id number] Patient Account Number: [id number] Date of Birth/Sex: [birth year] (38 y.o. M) Treating  RN: Rodell PernaScott, Dajea Primary Care Provider: Nicki ReaperBAITY, REGINA Other Clinician: Referring Provider: Nicki ReaperBAITY, REGINA Treating Provider/Extender: Altamese CarolinaOBSON, Jaydalee Bardwell G Weeks in Treatment: 9 History of Present Illness HPI Description: 10/11/18 patient presents today for initial evaluation our clinic. He is being seen due to a left great toe ulcer which unfortunately has been present for quite some time. Initially he thought it really wasn't much of anything and then subsequently noted that he had some drainage then that seem to dry off and he didn't worry too much about it until more recently when he is had issues. 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 Subsequently he tells me he has not utilized any recreational drugs for two years. This is obviously excellent news. He also does have diabetes type II. It does sound like he is trying to improve things as far as his overall health status is concerned although his hemoglobin A-1 C as measured on 09/27/18 was 10.7. He did have an x-ray as well on 09/28/18 and this was negative for any evidence of acute or chronic osteomyelitis or fracture. Patient's wound is not appear to be severely infected at this time which is good news. With that being said he also doesn't really have any pain this is secondary to neuropathy. No fevers, chills, nausea, or vomiting noted at this time. 10/18/2018 on evaluation today patient actually appears to be doing better with regard to his toe ulcer. He has been tolerating the dressing changes without complication. Fortunately there is no signs of active infection at this time. Overall very pleased in this regard and how things are doing. Patient likewise is pleased and happy to see that things are doing much better. Still he does have some ways to go to get this area to completely close. 10/30/2018 on evaluation today patient's  ulcer on his plantar foot actually appears to be doing quite well at this time. He has been  tolerating the dressing changes without complication which is excellent news. With that being said I do believe he is making progress there is some callus buildup on without the clear away today but other than that I am seeing evidence that this seems to be filling in from the base and is getting much smaller in the base. Still were not at the point of complete closure yet. 11/09/2018 on evaluation today patient actually appears to be doing quite well with regard to his toe ulcer from the standpoint of infection. I do not see any signs of active infection at this time which is good news. With that being said he unfortunately is not really making much progress either he continues to have depth of the wound despite the fact that it seems to be callused over initially on inspection when I probed and actually remove some of the callus it is much deeper. Fortunately there is no signs of active infection at this time. 11/16/2018 on evaluation today patient's toe ulcer actually appears to be doing about the same. There is really no significant improvement compared to last time I saw him. There is some debridement necessary today he has callus covering the wound area I think he may be a good candidate for total contact cast to rather rapidly get this area to close. We previously have discussed this although we have not initiated it yet I think it may be time to do so and go ahead and get this closed. 11/22/2018 on evaluation today patient appears to be doing better with regard to the overall size of his wound. He has been tolerating the dressing changes without complication. Fortunately there is no signs of active infection at this time. No fevers, chills, nausea, vomiting, or diarrhea. 11/29/2018 on evaluation today patient appears to be doing about the same really with regard to his toe ulcer. He is not showing any  signs of dramatic improvement unfortunately. There is no signs of infection at this time. No fevers, chills, nausea, vomiting, or diarrhea. I really think we need to consider the total contact cast at this point in order to go ahead and get this closed. 12/04/2018 on evaluation today patient appears to be doing about the same with regard to his great toe ulcer. He is here today for the initiation of the total contact cast. Unfortunately he notes he did clip his toenail somewhat short on the second toe of the same foot he does have a slight paronychia noted at this point. There is some erythema is not spreading into the foot if just in the distal portion of the toe there is a little bit of evidence of fluid/pus buildup at the toenail margin. Nonetheless OLIS, VIVERETTE ([id number]) this is something that I definitely think we probably should treat just to make sure nothing gets worse while we apply the cast he still wants to apply the cast today we will be seeing him back in 2 days anyway to reapply the cast so I do not see any problems in that regard. 12/06/2018 on evaluation today patient appears to be measuring smaller with regard to his wound. The total contact cast just in 2 days seems to have already helped he is here for repeat application today. Fortunately there is no signs of active infection at this time. No fevers, chills, nausea, vomiting, or diarrhea. He did unfortunately get the cast wet this morning taking a shower he attempted to use a trash bag and it failed. I believe he should  get a cast protector which would be better as far as helping to avoid this from happening again. 10/29; the patient had a raised surface eschar over the wound area. Gently debrided with a #5 curette. The vast majority of this had closed however he had 1 small open area that still had perhaps 2 mm of depth. With considerable difficulty I was able to convince him to put the cast on for another few days. The  option would have been a Pegasys shoe. Electronic Signature(s) Signed: 12/13/2018 5:39:42 PM By: Baltazar Najjar MD Entered By: Baltazar Najjar on 12/13/2018 17:21:13 Martin Mullen, Martin Mullen ([id number]) -------------------------------------------------------------------------------- Physical Exam Details Patient Name: Martin Mullen Date of Service: 12/13/2018 4:00 PM Medical Record Number: [id number] Patient Account Number: [id number] Date of Birth/Sex: 03-Feb-1981 (38 y.o. M) Treating RN: Rodell Perna Primary Care Provider: [name] Other Clinician: Referring Provider: [name] Treating Provider/Extender: Altamese [place] in Treatment: 9 Constitutional Sitting or standing Blood Pressure is within target range for patient.. Pulse regular and within target range for patient.Marland Kitchen Respirations regular, non-labored and within target range.. Temperature is normal and within the target range for the patient.Marland Kitchen appears in no distress. Notes Wound exam; patient had callus over the entire wound area. Using a #3 curette I removed some callus and some thick skin from the area. He tolerates this fairly well. There is still a small open area present although I think this is improved via description of her nurses Electronic Signature(s) Signed: 12/13/2018 5:39:42 PM By: Baltazar Najjar MD Entered By: Baltazar Najjar on 12/13/2018 17:22:35 Martin Mullen, Martin Mullen ([id number]) -------------------------------------------------------------------------------- Physician Orders Details Patient Name: Martin Mullen Date of Service: 12/13/2018 4:00 PM Medical Record Number: [id number] Patient Account Number: [id number] Date of Birth/Sex: April 28, 1980 (38 y.o. M) Treating RN: Rodell Perna Primary Care Provider: [name] Other Clinician: Referring Provider: [name] Treating Provider/Extender: Altamese [place] in Treatment: 9 Verbal / Phone Orders: No Diagnosis Coding Wound Cleansing Wound #1  Left Toe Great o Clean wound with Normal Saline. - in office o Dial antibacterial soap, wash wounds, rinse and pat dry prior to dressing wounds Primary Wound Dressing Wound #1 Left Toe Great o Silver Collagen Secondary Dressing Wound #1 Left Toe Great o Foam Dressing Change Frequency Wound #1 Left Toe Great o Change dressing every week Follow-up Appointments Wound #1 Left Toe Great o Return Appointment in 1 week. - return Tuesday 12/18/18 to remove cast Off-Loading Wound #1 Left Toe Great o Total Contact Cast to Left Lower Extremity Electronic Signature(s) Signed: 12/13/2018 5:39:42 PM By: Baltazar Najjar MD Signed: 12/14/2018 9:12:39 AM By: Rodell Perna Entered By: Rodell Perna on 12/13/2018 16:22:49 IYAD, DEROO ([id number]) -------------------------------------------------------------------------------- Problem List Details Patient Name: Martin Mullen Date of Service: 12/13/2018 4:00 PM Medical Record Number: [id number] Patient Account Number: [id number] Date of Birth/Sex: 1981/01/05 (38 y.o. M) Treating RN: Rodell Perna Primary Care Provider: [name] Other Clinician: Referring Provider: [name] Treating Provider/Extender: Altamese [place] in Treatment: 9 Active Problems ICD-10 Evaluated Encounter Code Description Active Date Today Diagnosis E11.621 Type 2 diabetes mellitus with foot ulcer 10/11/2018 No Yes L97.522 Non-pressure chronic ulcer of other part of left foot with fat 10/11/2018 No Yes layer exposed L03.032 Cellulitis of left toe 12/04/2018 No Yes I10 Essential (primary) hypertension 10/11/2018 No Yes I25.10 Atherosclerotic heart disease of native coronary artery 10/11/2018 No Yes without angina pectoris Inactive Problems Resolved Problems Electronic Signature(s) Signed: 12/13/2018 5:39:42 PM By: Baltazar Najjar MD Entered By: Baltazar Najjar on 12/13/2018 17:18:47 Martin Mullen, Martin Mullen  (  [id number]) -------------------------------------------------------------------------------- Progress Note Details Patient Name: Martin Mullen, Martin Mullen Date of Service: 12/13/2018 4:00 PM Medical Record Number: [id number] Patient Account Number: [id number] Date of Birth/Sex: [birth year] (38 y.o. M) Treating RN: Rodell Perna Primary Care Provider: [name] Other Clinician: Referring Provider: [name] Treating Provider/Extender: Altamese [place] in Treatment: 9 Subjective History of Present Illness (HPI) 10/11/18 patient presents today for initial evaluation our clinic. He is being seen due to a left great toe ulcer which unfortunately has been present for quite some time. Initially he thought it really wasn't much of anything and then subsequently noted that he had some drainage then that seem to dry off and he didn't worry too much about it until more recently when he is had issues. 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 Subsequently he tells me he has not utilized any recreational drugs for two years. This is obviously excellent news. He also does have diabetes type II. It does sound like he is trying to improve things as far as his overall health status is concerned although his hemoglobin A-1 C as measured on 09/27/18 was 10.7. He did have an x-ray as well on 09/28/18 and this was negative for any evidence of acute or chronic osteomyelitis or fracture. Patient's wound is not appear to be severely infected at this time which is good news. With that being said he also doesn't really have any pain this is secondary to neuropathy. No fevers, chills, nausea, or vomiting noted at this time. 10/18/2018 on evaluation today patient actually appears to be doing better with regard to his toe ulcer. He has been tolerating the dressing changes without complication. Fortunately there  is no signs of active infection at this time. Overall very pleased in this regard and how things are doing. Patient likewise is pleased and happy to see that things are doing much better. Still he does have some ways to go to get this area to completely close. 10/30/2018 on evaluation today patient's ulcer on his plantar foot actually appears to be doing quite well at this time. He has been tolerating the dressing changes without complication which is excellent news. With that being said I do believe he is making progress there is some callus buildup on without the clear away today but other than that I am seeing evidence that this seems to be filling in from the base and is getting much smaller in the base. Still were not at the point of complete closure yet. 11/09/2018 on evaluation today patient actually appears to be doing quite well with regard to his toe ulcer from the standpoint of infection. I do not see any signs of active infection at this time which is good news. With that being said he unfortunately is not really making much progress either he continues to have depth of the wound despite the fact that it seems to be callused over initially on inspection when I probed and actually remove some of the callus it is much deeper. Fortunately there is no signs of active infection at this time. 11/16/2018 on evaluation today patient's toe ulcer actually appears to be doing about the same. There is really no significant improvement compared to last time I saw him. There is some debridement necessary today he has callus covering the wound area I think he may be a good candidate for total contact cast to rather rapidly  get this area to close. We previously have discussed this although we have not initiated it yet I think it may be time to do so and go ahead and get this closed. 11/22/2018 on evaluation today patient appears to be doing better with regard to the overall size of his wound. He has  been tolerating the dressing changes without complication. Fortunately there is no signs of active infection at this time. No fevers, chills, nausea, vomiting, or diarrhea. 11/29/2018 on evaluation today patient appears to be doing about the same really with regard to his toe ulcer. He is not showing any signs of dramatic improvement unfortunately. There is no signs of infection at this time. No fevers, chills, nausea, vomiting, or diarrhea. I really think we need to consider the total contact cast at this point in order to go ahead and get this closed. 12/04/2018 on evaluation today patient appears to be doing about the same with regard to his great toe ulcer. He is here today for the initiation of the total contact cast. Unfortunately he notes he did clip his toenail somewhat short on the second toe of the same foot he does have a slight paronychia noted at this point. There is some erythema is not spreading into the foot if just in the distal portion of the toe there is a little bit of evidence of fluid/pus buildup at the toenail margin. Nonetheless Martin Mullen, Martin Mullen ([id number]) this is something that I definitely think we probably should treat just to make sure nothing gets worse while we apply the cast he still wants to apply the cast today we will be seeing him back in 2 days anyway to reapply the cast so I do not see any problems in that regard. 12/06/2018 on evaluation today patient appears to be measuring smaller with regard to his wound. The total contact cast just in 2 days seems to have already helped he is here for repeat application today. Fortunately there is no signs of active infection at this time. No fevers, chills, nausea, vomiting, or diarrhea. He did unfortunately get the cast wet this morning taking a shower he attempted to use a trash bag and it failed. I believe he should get a cast protector which would be better as far as helping to avoid this from happening again. 10/29;  the patient had a raised surface eschar over the wound area. Gently debrided with a #5 curette. The vast majority of this had closed however he had 1 small open area that still had perhaps 2 mm of depth. With considerable difficulty I was able to convince him to put the cast on for another few days. The option would have been a Pegasys shoe. Objective Constitutional Sitting or standing Blood Pressure is within target range for patient.. Pulse regular and within target range for patient.Marland Kitchen Respirations regular, non-labored and within target range.. Temperature is normal and within the target range for the patient.Marland Kitchen appears in no distress. Vitals Time Taken: 3:31 PM, Height: 70 in, Weight: 230 lbs, BMI: 33, Temperature: 97.9 F, Pulse: 82 bpm, Respiratory Rate: 16 breaths/min, Blood Pressure: 122/69 mmHg. General Notes: Wound exam; patient had callus over the entire wound area. Using a #3 curette I removed some callus and some thick skin from the area. He tolerates this fairly well. There is still a small open area present although I think this is improved via description of her nurses Integumentary (Hair, Skin) Wound #1 status is Open. Original cause of wound was Gradually Appeared. The  wound is located on the Left Toe Great. The wound measures 0.2cm length x 0.2cm width x 0.1cm depth; 0.031cm^2 area and 0.003cm^3 volume. There is Fat Layer (Subcutaneous Tissue) Exposed exposed. There is no tunneling or undermining noted. There is a none present amount of drainage noted. The wound margin is flat and intact. There is no granulation within the wound bed. There is no necrotic tissue within the wound bed. Assessment Active Problems ICD-10 Type 2 diabetes mellitus with foot ulcer Non-pressure chronic ulcer of other part of left foot with fat layer exposed Cellulitis of left toe Essential (primary) hypertension Atherosclerotic heart disease of native coronary artery without angina  pectoris Martin Mullen, Martin Mullen ([id number]) Procedures Wound #1 Pre-procedure diagnosis of Wound #1 is a Diabetic Wound/Ulcer of the Lower Extremity located on the Left Toe Great .Severity of Tissue Pre Debridement is: Limited to breakdown of skin. There was a Selective/Open Wound Skin/Dermis Debridement with a total area of 0.04 sq cm performed by Maxwell CaulOBSON, Gianfranco Araki G, MD. With the following instrument(s): Curette to remove Non-Viable tissue/material. Material removed includes Callus and Skin: Dermis and after achieving pain control using Lidocaine. A time out was conducted at 16:17, prior to the start of the procedure. There was no bleeding. The procedure was tolerated well. Post Debridement Measurements: 0.2cm length x 0.2cm width x 0.1cm depth; 0.003cm^3 volume. Character of Wound/Ulcer Post Debridement is stable. Severity of Tissue Post Debridement is: Fat layer exposed. Post procedure Diagnosis Wound #1: Same as Pre-Procedure Plan Wound Cleansing: Wound #1 Left Toe Great: Clean wound with Normal Saline. - in office Dial antibacterial soap, wash wounds, rinse and pat dry prior to dressing wounds Primary Wound Dressing: Wound #1 Left Toe Great: Silver Collagen Secondary Dressing: Wound #1 Left Toe Great: Foam Dressing Change Frequency: Wound #1 Left Toe Great: Change dressing every week Follow-up Appointments: Wound #1 Left Toe Great: Return Appointment in 1 week. - return Tuesday 12/18/18 to remove cast Off-Loading: Wound #1 Left Toe Great: Total Contact Cast to Left Lower Extremity 1. We will use silver collagen once again under the total contact cast. I am hopeful this will be healed the next time he is here I do not know that we will be able to talk him into another cast application. 2. I have talked to him about padding up his existing foot wear. We will certainly have to keep this area padded and issues. I also told him to [facility] insurance whether they  cover diabetic shoes with custom inserts Electronic Signature(s) Signed: 12/13/2018 5:39:42 PM By: Baltazar Najjarobson, Lilya Smitherman MD DellekerOLIVER, Yarel ([id number]) Entered By: Baltazar Najjarobson, Ximenna Fonseca on 12/13/2018 17:23:33 Martin Mullen, Martin Mullen ([id number]) -------------------------------------------------------------------------------- SuperBill Details Patient Name: Martin Mullen, Martin Mullen Date of Service: 12/13/2018 Medical Record Number: [id number] Patient Account Number: [id number] Date of Birth/Sex: 1980-09-18 (38 y.o. M) Treating RN: Rodell PernaScott, Dajea Primary Care Provider: Nicki ReaperBAITY, REGINA Other Clinician: Referring Provider: Nicki ReaperBAITY, REGINA Treating Provider/Extender: Altamese CarolinaOBSON, Lajuanda Penick G Weeks in Treatment: 9 Diagnosis Coding ICD-10 Codes Code Description E11.621 Type 2 diabetes mellitus with foot ulcer L97.522 Non-pressure chronic ulcer of other part of left foot with fat layer exposed L03.032 Cellulitis of left toe I10 Essential (primary) hypertension I25.10 Atherosclerotic heart disease of native coronary artery without angina pectoris Facility Procedures CPT4 Code: [id number] Description: 97597 - DEBRIDE WOUND 1ST 20 SQ CM OR < ICD-10 Diagnosis Description L97.522 Non-pressure chronic ulcer of other part of left foot with fat Modifier: layer exposed Quantity: 1 Physician Procedures CPT4 Code: [id number] Description: 97597 - WC  PHYS DEBR WO ANESTH 20 SQ CM ICD-10 Diagnosis Description L97.522 Non-pressure chronic ulcer of other part of left foot with fat Modifier: layer exposed Quantity: 1 Electronic Signature(s) Signed: 12/13/2018 5:39:42 PM By: Baltazar Najjar MD Entered By: Baltazar Najjar on 12/13/2018 17:23:52

## 2018-12-14 NOTE — Progress Notes (Signed)
Martin Mullen, Martin Mullen ([id number]) Visit Report for 12/13/2018 Arrival Information Details Patient Name: Martin Mullen Date of Service: 12/13/2018 4:00 PM Medical Record Number: [id number] Patient Account Number: [id number] Date of Birth/Sex: 03/16/1980 (38 y.o. M) Treating RN: Huel Coventry Primary Care Meric Joye: [name] Other Clinician: Referring Dorraine Ellender: [name] Treating Roshell Brigham/Extender: Altamese [place] in Treatment: 9 Visit Information History Since Last Visit Added or deleted any medications: No Patient Arrived: Ambulatory Any new allergies or adverse reactions: No Arrival Time: 15:19 Had a fall or experienced change in No Accompanied By: self activities of daily living that may affect Transfer Assistance: None risk of falls: Patient Identification Verified: Yes Signs or symptoms of abuse/neglect since last visito No Secondary Verification Process Yes Hospitalized since last visit: No Completed: Implantable device outside of the clinic excluding No Patient Has Alerts: Yes cellular tissue based products placed in the center Patient Alerts: Patient on Blood since last visit: Thinner Has Dressing in Place as Prescribed: Yes 81mg  aspirin Has Footwear/Offloading in Place as Prescribed: Yes Left: Total Contact Cast Pain Present Now: No Electronic Signature(s) Signed: 12/13/2018 5:05:44 PM By: Elliot Gurney, BSN, RN, CWS, Kim RN, BSN Entered By: Elliot Gurney, BSN, RN, CWS, Kim on 12/13/2018 15:20:08 Martin Mullen ([id number]) -------------------------------------------------------------------------------- Encounter Discharge Information Details Patient Name: Martin Mullen Date of Service: 12/13/2018 4:00 PM Medical Record Number: [id number] Patient Account Number: [id number] Date of Birth/Sex: 1980/10/21 (38 y.o. M) Treating RN: Rodell Perna Primary Care Kmari Halter: [name] Other Clinician: Referring Cieanna Stormes: [name] Treating Evoleth Nordmeyer/Extender: Altamese [place] in Treatment: 9 Encounter Discharge Information Items Post Procedure Vitals Discharge Condition: Stable Temperature (F): 97.9 Ambulatory Status: Ambulatory Pulse (bpm): 82 Discharge Destination: Home Respiratory Rate (breaths/min): 16 Transportation: Private Auto Blood Pressure (mmHg): 122/89 Accompanied By: self Schedule Follow-up Appointment: Yes Clinical Summary of Care: Electronic Signature(s) Signed: 12/14/2018 9:12:39 AM By: Rodell Perna Entered By: Rodell Perna on 12/13/2018 16:24:34 Martin Mullen ([id number]) -------------------------------------------------------------------------------- Lower Extremity Assessment Details Patient Name: Martin Mullen Date of Service: 12/13/2018 4:00 PM Medical Record Number: [id number] Patient Account Number: [id number] Date of Birth/Sex: 1980-08-27 (38 y.o. M) Treating RN: Huel Coventry Primary Care Marck Mcclenny: [name] Other Clinician: Referring Rui Wordell: [name] Treating Jacody Beneke/Extender: Altamese [place] in Treatment: 9 Vascular Assessment Pulses: Dorsalis Pedis Palpable: [Left:Yes] Electronic Signature(s) Signed: 12/13/2018 5:05:44 PM By: Elliot Gurney, BSN, RN, CWS, Kim RN, BSN Entered By: Elliot Gurney, BSN, RN, CWS, Kim on 12/13/2018 15:31:24 Martin Mullen ([id number]) -------------------------------------------------------------------------------- Multi Wound Chart Details Patient Name: Martin Mullen Date of Service: 12/13/2018 4:00 PM Medical Record Number: [id number] Patient Account Number: [id number] Date of Birth/Sex: Jul 02, 1980 (38 y.o. M) Treating RN: Rodell Perna Primary Care Shuayb Schepers: [name] Other Clinician: Referring Marquasia Schmieder: [name] Treating Lavenia Stumpo/Extender: Altamese [place] in Treatment: 9 Vital Signs Height(in): 70 Pulse(bpm): 82 Weight(lbs): 230 Blood Pressure(mmHg): 122/69 Body Mass Index(BMI): 33 Temperature(F): 97.9 Respiratory Rate 16 (breaths/min): Photos:  [N/A:N/A] Wound Location: Left Toe Great N/A N/A Wounding Event: Gradually Appeared N/A N/A Primary Etiology: Diabetic Wound/Ulcer of the N/A N/A Lower Extremity Comorbid History: Coronary Artery Disease, N/A N/A Hypertension, Myocardial Infarction, Type II Diabetes Date Acquired: 08/30/2018 N/A N/A Weeks of Treatment: 9 N/A N/A Wound Status: Open N/A N/A Pending Amputation on Yes N/A N/A Presentation: Measurements L x W x D 0.2x0.2x0.1 N/A N/A (cm) Area (cm) : 0.031 N/A N/A Volume (cm) : 0.003 N/A N/A % Reduction in Area: -287.50% N/A N/A % Reduction in Volume: 40.00% N/A N/A Classification: Grade 1 N/A N/A Exudate Amount: None Present N/A  N/A Wound Margin: Flat and Intact N/A N/A Granulation Amount: None Present (0%) N/A N/A Necrotic Amount: None Present (0%) N/A N/A Exposed Structures: Fat Layer (Subcutaneous N/A N/A Tissue) Exposed: Yes Fascia: No Tendon: No Muscle: No Martin Mullen, Martin Mullen ([id number]) Joint: No Bone: No Epithelialization: Large (67-100%) N/A N/A Debridement: Debridement - Selective/Open N/A N/A Wound Pre-procedure 16:17 N/A N/A Verification/Time Out Taken: Pain Control: Lidocaine N/A N/A Tissue Debrided: Callus N/A N/A Level: Non-Viable Tissue N/A N/A Debridement Area (sq cm): 0.04 N/A N/A Instrument: Curette N/A N/A Bleeding: None N/A N/A Debridement Treatment Procedure was tolerated well N/A N/A Response: Post Debridement 0.2x0.2x0.1 N/A N/A Measurements L x W x D (cm) Post Debridement Volume: 0.003 N/A N/A (cm) Procedures Performed: Debridement N/A N/A Treatment Notes Wound #1 (Left Toe Great) Notes prisma, foam and TCC Electronic Signature(s) Signed: 12/13/2018 5:39:42 PM By: Baltazar Najjar MD Entered By: Baltazar Najjar on 12/13/2018 17:18:58 Martin Mullen ([id number]) -------------------------------------------------------------------------------- Multi-Disciplinary Care Plan Details Patient Name: Martin Mullen Date of Service:  12/13/2018 4:00 PM Medical Record Number: [id number] Patient Account Number: [id number] Date of Birth/Sex: 20-Sep-1980 (38 y.o. M) Treating RN: Rodell Perna Primary Care Seirra Kos: [name] Other Clinician: Referring Kenta Laster: [name] Treating Thurza Kwiecinski/Extender: Altamese [place] in Treatment: 9 Active Inactive Nutrition Nursing Diagnoses: Potential for alteratiion in Nutrition/Potential for imbalanced nutrition Goals: Patient/caregiver verbalizes understanding of need to maintain therapeutic glucose control per primary care physician Date Initiated: 10/11/2018 Target Resolution Date: 10/25/2018 Goal Status: Active Interventions: Assess HgA1c results as ordered upon admission and as needed Notes: Orientation to the Wound Care Program Nursing Diagnoses: Knowledge deficit related to the wound healing center program Goals: Patient/caregiver will verbalize understanding of the Wound Healing Center Program Date Initiated: 10/11/2018 Target Resolution Date: 10/19/2018 Goal Status: Active Interventions: Provide education on orientation to the wound center Notes: Wound/Skin Impairment Nursing Diagnoses: Impaired tissue integrity Goals: Ulcer/skin breakdown will have a volume reduction of 30% by week 4 Date Initiated: 10/11/2018 Target Resolution Date: 10/19/2018 Goal Status: Active Interventions: Assess ulceration(s) every visit Martin Mullen, Martin Mullen ([id number]) Notes: Electronic Signature(s) Signed: 12/14/2018 9:12:39 AM By: Rodell Perna Entered By: Rodell Perna on 12/13/2018 16:18:19 Martin Mullen ([id number]) -------------------------------------------------------------------------------- Pain Assessment Details Patient Name: Martin Mullen Date of Service: 12/13/2018 4:00 PM Medical Record Number: [id number] Patient Account Number: [id number] Date of Birth/Sex: 1980-11-23 (38 y.o. M) Treating RN: Huel Coventry Primary Care Yanni Quiroa: [name] Other Clinician: Referring  Yasuko Lapage: [name] Treating Jerald Villalona/Extender: Altamese [place] in Treatment: 9 Active Problems Location of Pain Severity and Description of Pain Patient Has Paino No Site Locations Pain Management and Medication Current Pain Management: Goals for Pain Management Patient denies pain at this time. Electronic Signature(s) Signed: 12/13/2018 5:05:44 PM By: Elliot Gurney, BSN, RN, CWS, Kim RN, BSN Entered By: Elliot Gurney, BSN, RN, CWS, Kim on 12/13/2018 15:20:23 Martin Mullen ([id number]) -------------------------------------------------------------------------------- Patient/Caregiver Education Details Patient Name: Martin Mullen Date of Service: 12/13/2018 4:00 PM Medical Record Number: [id number] Patient Account Number: [id number] Date of Birth/Gender: [birth year] (38 y.o. M) Treating RN: Rodell Perna Primary Care Physician: [name] Other Clinician: Referring Physician: [name] Treating Physician/Extender: Altamese Dillard in Treatment: 9 Education Assessment Education Provided To: Patient Education Topics Provided Wound/Skin Impairment: Handouts: Caring for Your Ulcer Methods: Demonstration, Explain/Verbal Responses: State content correctly Electronic Signature(s) Signed: 12/14/2018 9:12:39 AM By: Rodell Perna Entered By: Rodell Perna on 12/13/2018 16:23:34 Martin Mullen ([id number]) -------------------------------------------------------------------------------- Wound Assessment Details Patient Name: Martin Mullen Date of Service: 12/13/2018 4:00 PM Medical Record Number: [id number] Patient Account Number:  [id number] Date of Birth/Sex: 1980-07-07 (38 y.o. M) Treating RN: Cornell Barman Primary Care Burnis Kaser: Webb Silversmith Other Clinician: Referring Laticia Vannostrand: Webb Silversmith Treating Shandiin Eisenbeis/Extender: Tito Dine in Treatment: 9 Wound Status Wound Number: 1 Primary Diabetic Wound/Ulcer of the Lower Extremity Etiology: Wound Location: Left Toe  Great Wound Open Wounding Event: Gradually Appeared Status: Date Acquired: 08/30/2018 Comorbid Coronary Artery Disease, Hypertension, Weeks Of Treatment: 9 History: Myocardial Infarction, Type II Diabetes Clustered Wound: No Pending Amputation On Presentation Photos Wound Measurements Length: (cm) 0.2 % Reduction i Width: (cm) 0.2 % Reduction i Depth: (cm) 0.1 Epithelializa Area: (cm) 0.031 Tunneling: Volume: (cm) 0.003 Undermining: n Area: -287.5% n Volume: 40% tion: Large (67-100%) No No Wound Description Classification: Grade 1 Foul Odor Af Wound Margin: Flat and Intact Slough/Fibri Exudate Amount: None Present ter Cleansing: No no No Wound Bed Granulation Amount: None Present (0%) Exposed Structure Necrotic Amount: None Present (0%) Fascia Exposed: No Fat Layer (Subcutaneous Tissue) Exposed: Yes Tendon Exposed: No Muscle Exposed: No Joint Exposed: No Bone Exposed: No Treatment Notes Wound #1 (Left [address]Martin Mullen, Martin Mullen ([id number]) Notes prisma, foam and TCC Electronic Signature(s) Signed: 12/13/2018 5:05:44 PM By: Gretta Cool, BSN, RN, CWS, Kim RN, BSN Entered By: Gretta Cool, BSN, RN, CWS, Kim on 12/13/2018 15:31:09 Martin Mullen ([id number]) -------------------------------------------------------------------------------- [place] Details Patient Name: Martin Mullen Date of Service: 12/13/2018 4:00 PM Medical Record Number: [id number] Patient Account Number: [id number] Date of Birth/Sex: [birth year] (38 y.o. M) Treating RN: Cornell Barman Primary Care Fleet Higham: Webb Silversmith Other Clinician: Referring Broxton Broady: Webb Silversmith Treating Gates Jividen/Extender: Tito Dine in Treatment: 9 Vital Signs Time Taken: 15:31 Temperature (F): 97.9 Height (in): 70 Pulse (bpm): 82 Weight (lbs): 230 Respiratory Rate (breaths/min): 16 Body Mass Index (BMI): 33 Blood Pressure (mmHg): 122/69 Reference Range: 80 - 120 mg / dl Electronic Signature(s) Signed: 12/13/2018  5:05:44 PM By: Gretta Cool, BSN, RN, CWS, Kim RN, BSN Entered By: Gretta Cool, BSN, RN, CWS, Kim on 12/13/2018 15:22:48

## 2018-12-18 ENCOUNTER — Other Ambulatory Visit: Payer: Self-pay

## 2018-12-18 ENCOUNTER — Encounter: Payer: BC Managed Care – PPO | Attending: Physician Assistant | Admitting: Physician Assistant

## 2018-12-18 DIAGNOSIS — L97522 Non-pressure chronic ulcer of other part of left foot with fat layer exposed: Secondary | ICD-10-CM | POA: Insufficient documentation

## 2018-12-18 DIAGNOSIS — I252 Old myocardial infarction: Secondary | ICD-10-CM | POA: Diagnosis not present

## 2018-12-18 DIAGNOSIS — I10 Essential (primary) hypertension: Secondary | ICD-10-CM | POA: Insufficient documentation

## 2018-12-18 DIAGNOSIS — L03032 Cellulitis of left toe: Secondary | ICD-10-CM | POA: Insufficient documentation

## 2018-12-18 DIAGNOSIS — E785 Hyperlipidemia, unspecified: Secondary | ICD-10-CM | POA: Diagnosis not present

## 2018-12-18 DIAGNOSIS — I2510 Atherosclerotic heart disease of native coronary artery without angina pectoris: Secondary | ICD-10-CM | POA: Insufficient documentation

## 2018-12-18 DIAGNOSIS — E1151 Type 2 diabetes mellitus with diabetic peripheral angiopathy without gangrene: Secondary | ICD-10-CM | POA: Insufficient documentation

## 2018-12-18 DIAGNOSIS — E1140 Type 2 diabetes mellitus with diabetic neuropathy, unspecified: Secondary | ICD-10-CM | POA: Insufficient documentation

## 2018-12-18 DIAGNOSIS — E11621 Type 2 diabetes mellitus with foot ulcer: Secondary | ICD-10-CM | POA: Insufficient documentation

## 2018-12-18 DIAGNOSIS — Z955 Presence of coronary angioplasty implant and graft: Secondary | ICD-10-CM | POA: Insufficient documentation

## 2018-12-19 NOTE — Progress Notes (Addendum)
Martin Mullen ([id number]) Visit Report for 12/18/2018 Arrival Information Details Patient Name: Martin Mullen, Martin Mullen Date of Service: 12/18/2018 3:30 PM Medical Record Number: [id number] Patient Account Number: [id number] Date of Birth/Sex: 1981-02-08 (38 y.o. M) Treating RN: Harold Barban Primary Care Chiquitta Matty: Webb Silversmith Other Clinician: Referring [place]: Webb Silversmith Treating Gabbrielle Mcnicholas/Extender: Melburn Hake, HOYT Weeks in Treatment: 9 Visit Information History Since Last Visit Added or deleted any medications: No Patient Arrived: Ambulatory Any new allergies or adverse reactions: No Arrival Time: 15:41 Had a fall or experienced change in No Accompanied By: self activities of daily living that may affect Transfer Assistance: None risk of falls: Patient Has Alerts: Yes Signs or symptoms of abuse/neglect since last visito No Patient Alerts: Patient on Blood Thinner Hospitalized since last visit: No 81mg  aspirin Has Dressing in Place as Prescribed: Yes Has Compression in Place as Prescribed: No Has Footwear/Offloading in Place as Prescribed: Yes Left: Total Contact Cast Pain Present Now: No Electronic Signature(s) Signed: 12/18/2018 4:24:18 PM By: Harold Barban Entered By: Harold Barban on 12/18/2018 15:43:07 Martin Mullen ([id number]) -------------------------------------------------------------------------------- Clinic Level of Care Assessment Details Patient Name: Martin Mullen Date of Service: 12/18/2018 3:30 PM Medical Record Number: [id number] Patient Account Number: [id number] Date of Birth/Sex: 08-Nov-1980 (38 y.o. M) Treating RN: Montey Hora Primary Care Taishaun Levels: Webb Silversmith Other Clinician: Referring Dillie Burandt: Webb Silversmith Treating Laurina Fischl/Extender: Melburn Hake, HOYT Weeks in Treatment: 9 Clinic Level of Care Assessment Items TOOL 4 Quantity Score []  - Use when only an EandM is performed on FOLLOW-UP visit 0 ASSESSMENTS - Nursing Assessment /  Reassessment X - Reassessment of Co-morbidities (includes updates in patient status) 1 10 X- 1 5 Reassessment of Adherence to Treatment Plan ASSESSMENTS - Wound and Skin Assessment / Reassessment X - Simple Wound Assessment / Reassessment - one wound 1 5 []  - 0 Complex Wound Assessment / Reassessment - multiple wounds []  - 0 Dermatologic / Skin Assessment (not related to wound area) ASSESSMENTS - Focused Assessment []  - Circumferential Edema Measurements - multi extremities 0 []  - 0 Nutritional Assessment / Counseling / Intervention X- 1 5 Lower Extremity Assessment (monofilament, tuning fork, pulses) []  - 0 Peripheral Arterial Disease Assessment (using hand held doppler) ASSESSMENTS - Ostomy and/or Continence Assessment and Care []  - Incontinence Assessment and Management 0 []  - 0 Ostomy Care Assessment and Management (repouching, etc.) PROCESS - Coordination of Care X - Simple Patient / Family Education for ongoing care 1 15 []  - 0 Complex (extensive) Patient / Family Education for ongoing care X- 1 10 Staff obtains Programmer, systems, Records, Test Results / Process Orders []  - 0 Staff telephones HHA, Nursing Homes / Clarify orders / etc []  - 0 Routine Transfer to another Facility (non-emergent condition) []  - 0 Routine Hospital Admission (non-emergent condition) []  - 0 New Admissions / Biomedical engineer / Ordering NPWT, Apligraf, etc. []  - 0 Emergency Hospital Admission (emergent condition) X- 1 10 Simple Discharge Coordination Martin Mullen ([id number]) []  - 0 Complex (extensive) Discharge Coordination PROCESS - Special Needs []  - Pediatric / Minor Patient Management 0 []  - 0 Isolation Patient Management []  - 0 Hearing / Language / Visual special needs []  - 0 Assessment of Community assistance (transportation, D/C planning, etc.) []  - 0 Additional assistance / Altered mentation []  - 0 Support Surface(s) Assessment (bed, cushion, seat, etc.) INTERVENTIONS -  Wound Cleansing / Measurement X - Simple Wound Cleansing - one wound 1 5 []  - 0 Complex Wound Cleansing - multiple wounds X- 1 5 Wound Imaging (photographs - any  number of wounds) []  - 0 Wound Tracing (instead of photographs) X- 1 5 Simple Wound Measurement - one wound []  - 0 Complex Wound Measurement - multiple wounds INTERVENTIONS - Wound Dressings []  - Small Wound Dressing one or multiple wounds 0 []  - 0 Medium Wound Dressing one or multiple wounds []  - 0 Large Wound Dressing one or multiple wounds []  - 0 Application of Medications - topical []  - 0 Application of Medications - injection INTERVENTIONS - Miscellaneous []  - External ear exam 0 []  - 0 Specimen Collection (cultures, biopsies, blood, body fluids, etc.) []  - 0 Specimen(s) / Culture(s) sent or taken to Lab for analysis []  - 0 Patient Transfer (multiple staff / / Similar devices) []  - 0 Simple Staple / Suture removal (25 or less) []  - 0 Complex Staple / Suture removal (26 or more) []  - 0 Hypo / Hyperglycemic Management (close monitor of Blood Glucose) []  - 0 Ankle / Brachial Index (ABI) - do not check if billed separately X- 1 5 Vital Signs Martin Mullen, Martin Mullen ( ) Has the patient been seen at the hospital within the last three years: Yes Total Score: 80 Level Of Care: New/Established - Level 3 Electronic Signature(s) Signed: 12/18/2018 4:57:37 PM By: Entered By: on 12/18/2018 16:12:55 ( ) -------------------------------------------------------------------------------- Encounter Discharge Information Details Patient Name: Date of Service: 12/18/2018 3:30 PM Medical Record Number: Nurse, adult Patient Account Number: Date of Birth/Sex: 12-02-1980 (38 y.o. M) Treating RN: Primary Care Joliyah Lippens: Joelene Millin Other Clinician: Referring Johnanthony Wilden: [id number] Treating Miron Marxen/Extender: 13/04/2018,  HOYT Weeks in Treatment: 9 Encounter Discharge Information Items Discharge Condition: Stable Ambulatory Status: Ambulatory Discharge Destination: Home Transportation: Private Auto Accompanied By: self Schedule Follow-up Appointment: No Clinical Summary of Care: Electronic Signature(s) Signed: 12/18/2018 4:57:37 PM By: Curtis Sites Entered By: 13/04/2018 on 12/18/2018 16:13:31 [id number] (Martin Mullen) -------------------------------------------------------------------------------- Lower Extremity Assessment Details Patient Name: 13/04/2018 Date of Service: 12/18/2018 3:30 PM Medical Record Number: [id number] Patient Account Number: 05/12/1980 Date of Birth/Sex: [birth year] (38 y.o. M) Treating RN: [name] Primary Care Reagen Haberman: [name] Other Clinician: Referring Nakyla Bracco: Linwood Dibbles Treating Lennex Pietila/Extender: STONE III, HOYT Weeks in Treatment: 9 Edema Assessment Assessed: [Left: No] [Right: No] [Left: Edema] [Right: :] Calf Left: Right: Point of Measurement: 33 cm From Medial Instep 37 cm cm Ankle Left: Right: Point of Measurement: 10 cm From Medial Instep 22.7 cm cm Vascular Assessment Pulses: Dorsalis Pedis Palpable: [Left:Yes] Posterior Tibial Palpable: [Left:Yes] Electronic Signature(s) Signed: 12/18/2018 4:24:18 PM By: Curtis Sites Entered By: Curtis Sites on 12/18/2018 15:53:31 Martin Mullen ([id number]) -------------------------------------------------------------------------------- Multi-Disciplinary Care Plan Details Patient Name: Martin Mullen Date of Service: 12/18/2018 3:30 PM Medical Record Number: [id number] Patient Account Number: [id number] Date of Birth/Sex: 07-13-1980 (38 y.o. M) Treating RN: Arnette Norris Primary Care Stana Bayon: [name] Other Clinician: Referring Callan Yontz: [name] Treating Rogen Porte/Extender: 13/04/2018, HOYT Weeks in Treatment: 9 Active Inactive Electronic Signature(s) Signed: 12/18/2018  4:57:37 PM By: Arnette Norris Entered By: 13/04/2018 on 12/18/2018 16:12:29 [id number] (Martin Mullen) -------------------------------------------------------------------------------- Pain Assessment Details Patient Name: 13/04/2018 Date of Service: 12/18/2018 3:30 PM Medical Record Number: [id number] Patient Account Number: 05/12/1980 Date of Birth/Sex: [birth year] (38 y.o. M) Treating RN: [name] Primary Care Duel Conrad: [name] Other Clinician: Referring Phenix Grein: Linwood Dibbles Treating Jeanelle Dake/Extender: 13/04/2018, HOYT Weeks in Treatment: 9 Active Problems Location of Pain Severity and Description of Pain Patient Has Paino No Site Locations Pain Management and Medication Current Pain  Management: Electronic Signature(s) Signed: 12/18/2018 4:24:18 PM By: Arnette Norris Entered By: Arnette Norris on 12/18/2018 15:44:32 Martin Mullen ([id number]) -------------------------------------------------------------------------------- Patient/Caregiver Education Details Patient Name: Martin Mullen Date of Service: 12/18/2018 3:30 PM Medical Record Number: [id number] Patient Account Number: [id number] Date of Birth/Gender: May 15, 1980 (38 y.o. M) Treating RN: Curtis Sites Primary Care Physician: [name] Other Clinician: Referring Physician: [name] Treating Physician/Extender: Skeet Simmer in Treatment: 9 Education Assessment Education Provided To: Patient Education Topics Provided Basic Hygiene: Handouts: Other: care of newly healed wound site Methods: Explain/Verbal Responses: State content correctly Electronic Signature(s) Signed: 12/18/2018 4:57:37 PM By: Curtis Sites Entered By: Curtis Sites on 12/18/2018 16:13:15 Martin Mullen ([id number]) -------------------------------------------------------------------------------- Wound Assessment Details Patient Name: Martin Mullen Date of Service: 12/18/2018 3:30 PM Medical Record Number:  [id number] Patient Account Number: [id number] Date of Birth/Sex: 22-Oct-1980 (38 y.o. M) Treating RN: Curtis Sites Primary Care Lyanne Kates: [name] Other Clinician: Referring Lynnel Zanetti: [name] Treating Ivet Guerrieri/Extender: STONE III, HOYT Weeks in Treatment: 9 Wound Status Wound Number: 1 Primary Diabetic Wound/Ulcer of the Lower Extremity Etiology: Wound Location: Left Toe Great Wound Healed - Epithelialized Wounding Event: Gradually Appeared Status: Date Acquired: 08/30/2018 Comorbid Coronary Artery Disease, Hypertension, Weeks Of Treatment: 9 History: Myocardial Infarction, Type II Diabetes Clustered Wound: No Pending Amputation On Presentation Photos Wound Measurements Length: (cm) 0 % Reduction Width: (cm) 0 % Reduction Depth: (cm) 0 Epitheliali Area: (cm) 0 Tunneling: Volume: (cm) 0 Underminin in Area: 100% in Volume: 100% zation: Large (67-100%) No g: No Wound Description Classification: Grade 1 Foul Odor Wound Margin: Flat and Intact Slough/Fib Exudate Amount: None Present After Cleansing: No rino No Wound Bed Granulation Amount: None Present (0%) Exposed Structure Necrotic Amount: None Present (0%) Fascia Exposed: No Fat Layer (Subcutaneous Tissue) Exposed: Yes Tendon Exposed: No Muscle Exposed: No Joint Exposed: No Bone Exposed: No Electronic Signature(s) Signed: 12/18/2018 4:57:37 PM By: Lorenda Peck, Ebbie Ridge ([id number]) Entered By: Curtis Sites on 12/18/2018 16:04:19 Martin Mullen ([id number]) -------------------------------------------------------------------------------- Vitals Details Patient Name: Martin Mullen Date of Service: 12/18/2018 3:30 PM Medical Record Number: [id number] Patient Account Number: [id number] Date of Birth/Sex: 1980-08-03 (38 y.o. M) Treating RN: Arnette Norris Primary Care Marcie Shearon: [name] Other Clinician: Referring Grey Rakestraw: [name] Treating Donley Harland/Extender: STONE III, HOYT Weeks in  Treatment: 9 Vital Signs Time Taken: 15:40 Temperature (F): 97.8 Height (in): 70 Pulse (bpm): 89 Weight (lbs): 230 Respiratory Rate (breaths/min): 18 Body Mass Index (BMI): 33 Blood Pressure (mmHg): 158/81 Reference Range: 80 - 120 mg / dl Electronic Signature(s) Signed: 12/18/2018 4:24:18 PM By: Arnette Norris Entered By: Arnette Norris on 12/18/2018 15:44:53

## 2019-01-02 NOTE — Progress Notes (Signed)
RUBY, DILONE ([id number]) Visit Report for 12/18/2018 Chief Complaint Document Details Patient Name: Martin Mullen, Martin Mullen Date of Service: 12/18/2018 3:30 PM Medical Record Number: [id number] Patient Account Number: [id number] Date of Birth/Sex: November 17, 1980 (38 y.o. M) Treating RN: Curtis Sites Primary Care Provider: [name] Other Clinician: Referring Provider: [name] Treating Provider/Extender: Linwood Dibbles, Kyrillos Adams Weeks in Treatment: 9 Information Obtained from: Patient Chief Complaint left great toe ulcer Electronic Signature(s) Signed: 12/20/2018 10:04:02 AM By: Lenda Kelp PA-C Entered By: Lenda Kelp on 12/20/2018 10:04:02 Martin Mullen ([id number]) -------------------------------------------------------------------------------- HPI Details Patient Name: Martin Mullen Date of Service: 12/18/2018 3:30 PM Medical Record Number: [id number] Patient Account Number: [id number] Date of Birth/Sex: [birth year] (38 y.o. M) Treating RN: Curtis Sites Primary Care Provider: [name] Other Clinician: Referring Provider: [name] Treating Provider/Extender: Linwood Dibbles, Aundra Pung Weeks in Treatment: 9 History of Present Illness HPI Description: 10/11/18 patient presents today for initial evaluation our clinic. He is being seen due to a left great toe ulcer which unfortunately has been present for quite some time. Initially he thought it really wasn't much of anything and then subsequently noted that he had some drainage then that seem to dry off and he didn't worry too much about it until more recently when he is had issues. 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 Subsequently he tells me he has not utilized any recreational drugs for two years. This is obviously excellent news. He also does have diabetes type II. It does sound like he is trying to improve things as  far as his overall health status is concerned although his hemoglobin A-1 C as measured on 09/27/18 was 10.7. He did have an x-ray as well on 09/28/18 and this was negative for any evidence of acute or chronic osteomyelitis or fracture. Patient's wound is not appear to be severely infected at this time which is good news. With that being said he also doesn't really have any pain this is secondary to neuropathy. No fevers, chills, nausea, or vomiting noted at this time. 10/18/2018 on evaluation today patient actually appears to be doing better with regard to his toe ulcer. He has been tolerating the dressing changes without complication. Fortunately there is no signs of active infection at this time. Overall very pleased in this regard and how things are doing. Patient likewise is pleased and happy to see that things are doing much better. Still he does have some ways to go to get this area to completely close. 10/30/2018 on evaluation today patient's ulcer on his plantar foot actually appears to be doing quite well at this time. He has been tolerating the dressing changes without complication which is excellent news. With that being said I do believe he is making progress there is some callus buildup on without the clear away today but other than that I am seeing evidence that this seems to be filling in from the base and is getting much smaller in the base. Still were not at the point of complete closure yet. 11/09/2018 on evaluation today patient actually appears to be doing quite well with regard to his toe ulcer from the standpoint of infection. I do not see any signs of active infection at this time which is good news. With that being said he unfortunately is not really making much progress either he continues to have depth of the wound despite the fact that  it seems to be callused over initially on inspection when I probed and actually remove some of the callus it is much deeper. Fortunately there  is no signs of active infection at this time. 11/16/2018 on evaluation today patient's toe ulcer actually appears to be doing about the same. There is really no significant improvement compared to last time I saw him. There is some debridement necessary today he has callus covering the wound area I think he may be a good candidate for total contact cast to rather rapidly get this area to close. We previously have discussed this although we have not initiated it yet I think it may be time to do so and go ahead and get this closed. 11/22/2018 on evaluation today patient appears to be doing better with regard to the overall size of his wound. He has been tolerating the dressing changes without complication. Fortunately there is no signs of active infection at this time. No fevers, chills, nausea, vomiting, or diarrhea. 11/29/2018 on evaluation today patient appears to be doing about the same really with regard to his toe ulcer. He is not showing any signs of dramatic improvement unfortunately. There is no signs of infection at this time. No fevers, chills, nausea, vomiting, or diarrhea. I really think we need to consider the total contact cast at this point in order to go ahead and get this closed. 12/04/2018 on evaluation today patient appears to be doing about the same with regard to his great toe ulcer. He is here today for the initiation of the total contact cast. Unfortunately he notes he did clip his toenail somewhat short on the second toe of the same foot he does have a slight paronychia noted at this point. There is some erythema is not spreading into the foot if just in the distal portion of the toe there is a little bit of evidence of fluid/pus buildup at the toenail margin. Nonetheless WAITMAN, GAMMAGE ([id number]) this is something that I definitely think we probably should treat just to make sure nothing gets worse while we apply the cast he still wants to apply the cast today we will be  seeing him back in 2 days anyway to reapply the cast so I do not see any problems in that regard. 12/06/2018 on evaluation today patient appears to be measuring smaller with regard to his wound. The total contact cast just in 2 days seems to have already helped he is here for repeat application today. Fortunately there is no signs of active infection at this time. No fevers, chills, nausea, vomiting, or diarrhea. He did unfortunately get the cast wet this morning taking a shower he attempted to use a trash bag and it failed. I believe he should get a cast protector which would be better as far as helping to avoid this from happening again. 10/29; the patient had a raised surface eschar over the wound area. Gently debrided with a #5 curette. The vast majority of this had closed however he had 1 small open area that still had perhaps 2 mm of depth. With considerable difficulty I was able to convince him to put the cast on for another few days. The option would have been a Pegasys shoe. 12/18/2018 upon evaluation today patient appears to be doing very well with regard to his wound on the toe. In fact this appears to be completely healed which is great news. There is no evidence of active infection at this time which is also good news. No  fevers, chills, nausea, vomiting, or diarrhea. Overall the patient is very pleased with how things seem to have progressed. Electronic Signature(s) Signed: 12/20/2018 10:04:09 AM By: Lenda Kelp PA-C Entered By: Lenda Kelp on 12/20/2018 10:04:09 HASSEL, UPHOFF ([id number]) -------------------------------------------------------------------------------- Physical Exam Details Patient Name: Martin Mullen Date of Service: 12/18/2018 3:30 PM Medical Record Number: [id number] Patient Account Number: [id number] Date of Birth/Sex: 1980-07-11 (38 y.o. M) Treating RN: Curtis Sites Primary Care Provider: [name] Other Clinician: Referring Provider: [name] Treating Provider/Extender: STONE III, Issai Werling Weeks in Treatment: 9 Constitutional Well-nourished and well-hydrated in no acute distress. Respiratory normal breathing without difficulty. Psychiatric this patient is able to make decisions and demonstrates good insight into disease process. Alert and Oriented x 3. pleasant and cooperative. Notes Upon inspection today patient's wound bed actually showed signs of fairly good epithelization which is great news and again I do not see any signs of active infection at this time. He has no pain really at this point noted either which is also great news. Electronic Signature(s) Signed: 12/20/2018 10:04:54 AM By: Lenda Kelp PA-C Entered By: Lenda Kelp on 12/20/2018 10:04:53 Martin Mullen ([id number]) -------------------------------------------------------------------------------- Physician Orders Details Patient Name: Martin Mullen Date of Service: 12/18/2018 3:30 PM Medical Record Number: [id number] Patient Account Number: [id number] Date of Birth/Sex: January 14, 1981 (38 y.o. M) Treating RN: Curtis Sites Primary Care Provider: [name] Other Clinician: Referring Provider: [name] Treating Provider/Extender: Linwood Dibbles, Nickoli Bagheri Weeks in Treatment: 9 Verbal / Phone Orders: No Diagnosis Coding Discharge From [facility] Services o Discharge from Wound Care Center Electronic Signature(s) Signed: 12/18/2018 4:57:37 PM By: Curtis Sites Signed: 12/20/2018 6:06:55 PM By: Lenda Kelp PA-C Entered By: Curtis Sites on 12/18/2018 16:04:37 Martin Mullen ([id number]) -------------------------------------------------------------------------------- Problem List Details Patient Name: Martin Mullen Date of Service: 12/18/2018 3:30 PM Medical Record Number: [id number] Patient Account Number: [id number] Date of Birth/Sex: 1980/10/19 (38 y.o. M) Treating RN: Curtis Sites Primary Care Provider: [name] Other Clinician: Referring  Provider: [name] Treating Provider/Extender: Linwood Dibbles, Genella Bas Weeks in Treatment: 9 Active Problems ICD-10 Evaluated Encounter Code Description Active Date Today Diagnosis E11.621 Type 2 diabetes mellitus with foot ulcer 10/11/2018 No Yes L97.522 Non-pressure chronic ulcer of other part of left foot with fat 10/11/2018 No Yes layer exposed L03.032 Cellulitis of left toe 12/04/2018 No Yes I10 Essential (primary) hypertension 10/11/2018 No Yes I25.10 Atherosclerotic heart disease of native coronary artery 10/11/2018 No Yes without angina pectoris Inactive Problems Resolved Problems Electronic Signature(s) Signed: 12/20/2018 10:03:57 AM By: Lenda Kelp PA-C Entered By: Lenda Kelp on 12/20/2018 10:03:57 Martin Mullen ([id number]) -------------------------------------------------------------------------------- Progress Note Details Patient Name: Martin Mullen Date of Service: 12/18/2018 3:30 PM Medical Record Number: [id number] Patient Account Number: [id number] Date of Birth/Sex: 06/04/1980 (38 y.o. M) Treating RN: Curtis Sites Primary Care Provider: [name] Other Clinician: Referring Provider: [name] Treating Provider/Extender: Linwood Dibbles, Aviana Shevlin Weeks in Treatment: 9 Subjective Chief Complaint Information obtained from Patient left great toe ulcer History of Present Illness (HPI) 10/11/18 patient presents today for initial evaluation our clinic. He is being seen due to a left great toe ulcer which unfortunately has been present for quite some time. Initially he thought it really wasn't much of anything and then subsequently noted that he had some drainage then that seem to dry off and he didn't worry too much about it until more recently when he is had issues. With that being said he does have a history of hypertension, coronary artery disease secondary to  cocaine abuse and he has a heart stent secondary to this. Subsequently he tells me he has not utilized  any recreational drugs for two years. This is obviously excellent news. He also does have diabetes type II. It does sound like he is trying to improve things as far as his overall health status is concerned although his hemoglobin A-1 C as measured on 09/27/18 was 10.7. He did have an x-ray as well on 09/28/18 and this was negative for any evidence of acute or chronic osteomyelitis or fracture. Patient's wound is not appear to be severely infected at this time which is good news. With that being said he also doesn't really have any pain this is secondary to neuropathy. No fevers, chills, nausea, or vomiting noted at this time. 10/18/2018 on evaluation today patient actually appears to be doing better with regard to his toe ulcer. He has been tolerating the dressing changes without complication. Fortunately there is no signs of active infection at this time. Overall very pleased in this regard and how things are doing. Patient likewise is pleased and happy to see that things are doing much better. Still he does have some ways to go to get this area to completely close. 10/30/2018 on evaluation today patient's ulcer on his plantar foot actually appears to be doing quite well at this time. He has been tolerating the dressing changes without complication which is excellent news. With that being said I do believe he is making progress there is some callus buildup on without the clear away today but other than that I am seeing evidence that this seems to be filling in from the base and is getting much smaller in the base. Still were not at the point of complete closure yet. 11/09/2018 on evaluation today patient actually appears to be doing quite well with regard to his toe ulcer from the standpoint of infection. I do not see any signs of active infection at this time which is good news. With that being said he unfortunately is not really making much progress either he continues to have depth of the wound  despite the fact that it seems to be callused over initially on inspection when I probed and actually remove some of the callus it is much deeper. Fortunately there is no signs of active infection at this time. 11/16/2018 on evaluation today patient's toe ulcer actually appears to be doing about the same. There is really no significant improvement compared to last time I saw him. There is some debridement necessary today he has callus covering the wound area I think he may be a good candidate for total contact cast to rather rapidly get this area to close. We previously have discussed this although we have not initiated it yet I think it may be time to do so and go ahead and get this closed. 11/22/2018 on evaluation today patient appears to be doing better with regard to the overall size of his wound. He has been tolerating the dressing changes without complication. Fortunately there is no signs of active infection at this time. No fevers, chills, nausea, vomiting, or diarrhea. 11/29/2018 on evaluation today patient appears to be doing about the same really with regard to his toe ulcer. He is not showing any signs of dramatic improvement unfortunately. There is no signs of infection at this time. No fevers, chills, nausea, vomiting, or diarrhea. I really think we need to consider the total contact cast at this point in order to go ahead and  get this closed. Martin JeffersonOLIVER, Solon ([id number]) 12/04/2018 on evaluation today patient appears to be doing about the same with regard to his great toe ulcer. He is here today for the initiation of the total contact cast. Unfortunately he notes he did clip his toenail somewhat short on the second toe of the same foot he does have a slight paronychia noted at this point. There is some erythema is not spreading into the foot if just in the distal portion of the toe there is a little bit of evidence of fluid/pus buildup at the toenail margin. Nonetheless this is  something that I definitely think we probably should treat just to make sure nothing gets worse while we apply the cast he still wants to apply the cast today we will be seeing him back in 2 days anyway to reapply the cast so I do not see any problems in that regard. 12/06/2018 on evaluation today patient appears to be measuring smaller with regard to his wound. The total contact cast just in 2 days seems to have already helped he is here for repeat application today. Fortunately there is no signs of active infection at this time. No fevers, chills, nausea, vomiting, or diarrhea. He did unfortunately get the cast wet this morning taking a shower he attempted to use a trash bag and it failed. I believe he should get a cast protector which would be better as far as helping to avoid this from happening again. 10/29; the patient had a raised surface eschar over the wound area. Gently debrided with a #5 curette. The vast majority of this had closed however he had 1 small open area that still had perhaps 2 mm of depth. With considerable difficulty I was able to convince him to put the cast on for another few days. The option would have been a Pegasys shoe. 12/18/2018 upon evaluation today patient appears to be doing very well with regard to his wound on the toe. In fact this appears to be completely healed which is great news. There is no evidence of active infection at this time which is also good news. No fevers, chills, nausea, vomiting, or diarrhea. Overall the patient is very pleased with how things seem to have progressed. Patient History Information obtained from Patient. Family History Diabetes - Mother,Father,Paternal Grandparents,Maternal Grandparents, No family history of Cancer, Heart Disease, Hereditary Spherocytosis, Hypertension, Kidney Disease, Lung Disease, Seizures, Stroke, Thyroid Problems, Tuberculosis. Social History Current every day smoker, Marital Status - Single, Alcohol Use -  Rarely, Drug Use - Prior History, Caffeine Use - Daily. Medical History Cardiovascular Patient has history of Coronary Artery Disease, Hypertension, Myocardial Infarction Denies history of Angina, Arrhythmia, Congestive Heart Failure, Deep Vein Thrombosis, Hypotension, Peripheral Arterial Disease, Peripheral Venous Disease, Phlebitis, Vasculitis Endocrine Patient has history of Type II Diabetes Denies history of Type I Diabetes Integumentary (Skin) Denies history of History of Burn, History of pressure wounds Medical And Surgical History Notes Cardiovascular HLD Review of Systems (ROS) Constitutional Symptoms (General Health) Denies complaints or symptoms of Fatigue, Fever, Chills, Marked Weight Change. Respiratory Denies complaints or symptoms of Chronic or frequent coughs, Shortness of Breath. Cardiovascular Denies complaints or symptoms of Chest pain, LE edema. Psychiatric Denies complaints or symptoms of Anxiety, Claustrophobia. Martin JeffersonOLIVER, Garvin ([id number]) Objective Constitutional Well-nourished and well-hydrated in no acute distress. Vitals Time Taken: 3:40 PM, Height: 70 in, Weight: 230 lbs, BMI: 33, Temperature: 97.8 F, Pulse: 89 bpm, Respiratory Rate: 18 breaths/min, Blood Pressure: 158/81 mmHg. Respiratory normal breathing without difficulty.  Psychiatric this patient is able to make decisions and demonstrates good insight into disease process. Alert and Oriented x 3. pleasant and cooperative. General Notes: Upon inspection today patient's wound bed actually showed signs of fairly good epithelization which is great news and again I do not see any signs of active infection at this time. He has no pain really at this point noted either which is also great news. Integumentary (Hair, Skin) Wound #1 status is Healed - Epithelialized. Original cause of wound was Gradually Appeared. The wound is located on the Left Toe Great. The wound measures 0cm length x 0cm width x 0cm  depth; 0cm^2 area and 0cm^3 volume. There is Fat Layer (Subcutaneous Tissue) Exposed exposed. There is no tunneling or undermining noted. There is a none present amount of drainage noted. The wound margin is flat and intact. There is no granulation within the wound bed. There is no necrotic tissue within the wound bed. Assessment Active Problems ICD-10 Type 2 diabetes mellitus with foot ulcer Non-pressure chronic ulcer of other part of left foot with fat layer exposed Cellulitis of left toe Essential (primary) hypertension Atherosclerotic heart disease of native coronary artery without angina pectoris Plan MIKAL, BLASDELL ([id number]) Discharge From [facility] Services: Discharge from Wound Care Center 1. At this time I am going to discontinue the total contact cast as the patient is completely healed and overall seems to be doing quite well. 2. With regard to continued offloading I do believe he would benefit from diabetic shoes. I gave him the prescription for this today although he will need to have this filled out by his primary care provider before taking that to [facility] medical supply. 3. In the meantime I recommended to continue to wear the offloading shoe that we have given him as I believe that can be beneficial for him as well. We will see the patient back for follow-up visit as needed. Electronic Signature(s) Signed: 12/20/2018 10:05:48 AM By: Lenda Kelp PA-C Entered By: Lenda Kelp on 12/20/2018 10:05:47 Martin Mullen ([id number]) -------------------------------------------------------------------------------- ROS/PFSH Details Patient Name: Martin Mullen Date of Service: 12/18/2018 3:30 PM Medical Record Number: [id number] Patient Account Number: [id number] Date of Birth/Sex: 19-Sep-1980 (38 y.o. M) Treating RN: Curtis Sites Primary Care Provider: [name] Other Clinician: Referring Provider: [name] Treating Provider/Extender: Linwood Dibbles, Kawehi Hostetter Weeks in  Treatment: 9 Information Obtained From Patient Constitutional Symptoms (General Health) Complaints and Symptoms: Negative for: Fatigue; Fever; Chills; Marked Weight Change Respiratory Complaints and Symptoms: Negative for: Chronic or frequent coughs; Shortness of Breath Cardiovascular Complaints and Symptoms: Negative for: Chest pain; LE edema Medical History: Positive for: Coronary Artery Disease; Hypertension; Myocardial Infarction Negative for: Angina; Arrhythmia; Congestive Heart Failure; Deep Vein Thrombosis; Hypotension; Peripheral Arterial Disease; Peripheral Venous Disease; Phlebitis; Vasculitis Past Medical History Notes: HLD Psychiatric Complaints and Symptoms: Negative for: Anxiety; Claustrophobia Endocrine Medical History: Positive for: Type II Diabetes Negative for: Type I Diabetes Treated with: Oral agents Integumentary (Skin) Medical History: Negative for: History of Burn; History of pressure wounds Immunizations Pneumococcal Vaccine: Received Pneumococcal Vaccination: Yes Implantable Devices None COURT, GRACIA ([id number]) Family and Social History Cancer: No; Diabetes: Yes - Mother,Father,Paternal Grandparents,Maternal Grandparents; Heart Disease: No; Hereditary Spherocytosis: No; Hypertension: No; Kidney Disease: No; Lung Disease: No; Seizures: No; Stroke: No; Thyroid Problems: No; Tuberculosis: No; Current every day smoker; Marital Status - Single; Alcohol Use: Rarely; Drug Use: Prior History; Caffeine Use: Daily; Financial Concerns: No; Food, Clothing or Shelter Needs: No; Support System Lacking: No; Transportation Concerns: No Physician Affirmation  I have reviewed and agree with the above information. Electronic Signature(s) Signed: 12/20/2018 6:06:55 PM By: Lenda KelpStone III, Krish Bailly PA-C Signed: 01/02/2019 4:47:24 PM By: Curtis Sitesorthy, Joanna Entered By: Lenda KelpStone III, Ledarrius Beauchaine on 12/20/2018 10:04:31 Martin JeffersonOLIVER, Trevis  ([id number]) -------------------------------------------------------------------------------- SuperBill Details Patient Name: Martin JeffersonLIVER, Zayvon Date of Service: 12/18/2018 Medical Record Number: [id number] Patient Account Number: [id number] Date of Birth/Sex: 1980-10-01 (38 y.o. M) Treating RN: Curtis Sitesorthy, Joanna Primary Care Provider: Nicki ReaperBAITY, REGINA Other Clinician: Referring Provider: Nicki ReaperBAITY, REGINA Treating Provider/Extender: Linwood DibblesSTONE III, Maizie Garno Weeks in Treatment: 9 Diagnosis Coding ICD-10 Codes Code Description E11.621 Type 2 diabetes mellitus with foot ulcer L97.522 Non-pressure chronic ulcer of other part of left foot with fat layer exposed L03.032 Cellulitis of left toe I10 Essential (primary) hypertension I25.10 Atherosclerotic heart disease of native coronary artery without angina pectoris Facility Procedures CPT4 Code: [id number] Description: 99213 - WOUND CARE VISIT-LEV 3 EST PT Modifier: Quantity: 1 Physician Procedures CPT4 Code: [id number] Description: 99213 - WC PHYS LEVEL 3 - EST PT ICD-10 Diagnosis Description 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

## 2019-01-07 ENCOUNTER — Telehealth: Payer: Self-pay | Admitting: Internal Medicine

## 2019-01-07 DIAGNOSIS — E1140 Type 2 diabetes mellitus with diabetic neuropathy, unspecified: Secondary | ICD-10-CM

## 2019-01-07 DIAGNOSIS — IMO0002 Reserved for concepts with insufficient information to code with codable children: Secondary | ICD-10-CM

## 2019-01-07 MED ORDER — GLIPIZIDE 10 MG PO TABS: 10.0000 mg | ORAL_TABLET | Freq: Two times a day (BID) | ORAL | 0 refills | Status: DC

## 2019-01-07 NOTE — Telephone Encounter (Signed)
Left message on voicemail Rx sent through [facility] But we have not received a refill request since 09/2018

## 2019-01-07 NOTE — Telephone Encounter (Signed)
Patient stated that [facility] [address] is waiting on an approval from our office for his GLIPIZIDE. Patient has been without medication for 2 weeks.

## 2019-02-12 ENCOUNTER — Ambulatory Visit: Payer: BC Managed Care – PPO | Attending: Internal Medicine

## 2019-02-12 DIAGNOSIS — Z20822 Contact with and (suspected) exposure to covid-19: Secondary | ICD-10-CM

## 2019-02-12 DIAGNOSIS — Z20828 Contact with and (suspected) exposure to other viral communicable diseases: Secondary | ICD-10-CM | POA: Diagnosis not present

## 2019-02-13 ENCOUNTER — Telehealth: Payer: Self-pay

## 2019-02-13 LAB — NOVEL CORONAVIRUS, NAA: SARS-CoV-2, NAA: NOT DETECTED

## 2019-02-13 NOTE — Telephone Encounter (Signed)
Patient called in requesting MyChart password reset and COVID19 lab results - DOB/Address verified - Results pending. Reviewed testing process with patient; reset MyChart password, no further questions.

## 2019-02-25 ENCOUNTER — Other Ambulatory Visit: Payer: Self-pay | Admitting: Internal Medicine

## 2019-02-25 DIAGNOSIS — E1140 Type 2 diabetes mellitus with diabetic neuropathy, unspecified: Secondary | ICD-10-CM

## 2019-02-25 DIAGNOSIS — IMO0002 Reserved for concepts with insufficient information to code with codable children: Secondary | ICD-10-CM

## 2019-02-27 ENCOUNTER — Telehealth: Payer: Self-pay

## 2019-02-27 NOTE — Telephone Encounter (Signed)
Noted  

## 2019-02-27 NOTE — Telephone Encounter (Signed)
I spoke with pt; pt has been constipated for 3 wks. No normal BM but has small constipated stools. Last normal BM was 3 wks ago.last constipated stool was this morning at 4:30(one small stool. Pt has upper abd pain; pain level is 2-3. Pt said is more discomfort than pain. Pt has taken 7 Ducolax over past 3 wks, pt has taken MOM; last MOM was one hr ago. Pt has also tried mag sulfate.pt said upper stomach feels hard and pt is not eating and drinking normally due to feeling full. Pt has generalized weakness, dry cough,fatigue and upper abd discomfort. Pt will go to [facility] for eval. FYI to Pamala Hurry NP.

## 2019-03-05 ENCOUNTER — Ambulatory Visit (INDEPENDENT_AMBULATORY_CARE_PROVIDER_SITE_OTHER)
Admission: RE | Admit: 2019-03-05 | Discharge: 2019-03-05 | Disposition: A | Discharge status: Home / Self Care | Payer: BC Managed Care – PPO | Source: Ambulatory Visit | Attending: Internal Medicine | Admitting: Internal Medicine

## 2019-03-05 ENCOUNTER — Ambulatory Visit (INDEPENDENT_AMBULATORY_CARE_PROVIDER_SITE_OTHER): Payer: BC Managed Care – PPO | Admitting: Internal Medicine

## 2019-03-05 ENCOUNTER — Encounter: Payer: Self-pay | Admitting: Internal Medicine

## 2019-03-05 ENCOUNTER — Other Ambulatory Visit: Payer: Self-pay

## 2019-03-05 VITALS — BP 136/84 | HR 92 | Temp 97.9°F | Wt 252.0 lb

## 2019-03-05 DIAGNOSIS — E1140 Type 2 diabetes mellitus with diabetic neuropathy, unspecified: Secondary | ICD-10-CM | POA: Diagnosis not present

## 2019-03-05 DIAGNOSIS — K5909 Other constipation: Secondary | ICD-10-CM | POA: Diagnosis not present

## 2019-03-05 DIAGNOSIS — E1165 Type 2 diabetes mellitus with hyperglycemia: Secondary | ICD-10-CM | POA: Diagnosis not present

## 2019-03-05 DIAGNOSIS — K5900 Constipation, unspecified: Secondary | ICD-10-CM | POA: Diagnosis not present

## 2019-03-05 DIAGNOSIS — IMO0002 Reserved for concepts with insufficient information to code with codable children: Secondary | ICD-10-CM

## 2019-03-05 LAB — POCT GLYCOSYLATED HEMOGLOBIN (HGB A1C): Hemoglobin A1C: 7.5 % — AB (ref 4.0–5.6)

## 2019-03-05 NOTE — Patient Instructions (Signed)

## 2019-03-05 NOTE — Progress Notes (Signed)
Subjective:    Patient ID: Martin Mullen, male    DOB: 1980-06-15, 39 y.o.   MRN: [id number]  HPI  Pt presents to the clinic today with c/o constipation, abdominal cramping, and bloating. This started 3 weeks ago. He reports occasional nausea and rectal pressure with some blood upon wiping. He reports intermittent constipation for years but is normally self controlled. He has tried OTC Dulcolax, Milk of Magnesia, and Magnesium citrate with minimal relief. He has also tried drinking more water and increasing fiber intake with minimal relief. He denies vomiting.   Of note, he reports he is not interested in going back to endocrinology. His last A1C was > 10 %. He reports he has been doing much better with diet and would like this rechecked today.  Review of Systems      Past Medical History:  Diagnosis Date  . Acute transmural inferior wall MI ([place]) 08/28/2012   .5 x 12 mm Veri-flex stent non-DES.  Marland Kitchen Chicken pox   . Coronary artery disease    Inferior ST elevation myocardial infarction in July of 2014. Cardiac catheterization showed 95% distal RCA stenosis and 90% first diagonal stenosis with normal ejection fraction. He underwent PCI in bare-metal stent placement to the distal RCA.  . Diabetes mellitus without complication ([place])   . Hypercholesterolemia   . Tobacco use     Current Outpatient Medications  Medication Sig Dispense Refill  . aspirin 81 MG EC tablet Take 1 tablet (81 mg total) by mouth daily. 30 tablet 4  . Blood Glucose Monitoring Suppl (BAYER CONTOUR NEXT MONITOR) w/Device KIT 1 Device 1 kit 0  . buPROPion (WELLBUTRIN XL) 150 MG 24 hr tablet Take 1 tablet (150 mg total) by mouth daily. 90 tablet 3  . CONTOUR NEXT TEST test strip USE 1 STRIP TO CHECK GLUCOSE THREE TIMES DAILY AS NEEDED 100 each 0  . glipiZIDE (GLUCOTROL) 10 MG tablet Take 1 tablet (10 mg total) by mouth 2 (two) times daily before a meal. 90 tablet 0  . Insulin Glargine (LANTUS SOLOSTAR) 100 UNIT/ML  Solostar Pen Inject 23 Units into the skin daily. 15 pen 1  . Insulin Pen Needle 31G X 8 MM MISC 1 each by Does not apply route daily. 90 each 1  . metFORMIN (GLUCOPHAGE) 1000 MG tablet TAKE 1 TABLET BY MOUTH TWICE DAILY WITH A MEAL 180 tablet 0  . nitroGLYCERIN (NITROSTAT) 0.4 MG SL tablet DISSOLVE ONE TABLET UNDER THE TONGUE EVERY 5 MINUTES AS NEEDED FOR CHEST PAIN.  DO NOT EXCEED A TOTAL OF 3 DOSES IN 15 MINUTES 25 tablet 12  . Omega-3 Fatty Acids (FISH OIL) 1200 MG CAPS Take 1,200 mg by mouth 3 (three) times daily.    Marland Kitchen omeprazole (PRILOSEC) 20 MG capsule Take 1 capsule (20 mg total) by mouth daily. 30 capsule 3  . simvastatin (ZOCOR) 80 MG tablet Take 1 tablet (80 mg total) by mouth daily. 90 tablet 3  . sulfamethoxazole-trimethoprim (BACTRIM DS) 800-160 MG tablet Take 1 tablet by mouth 2 (two) times daily. 28 tablet 0   No current facility-administered medications for this visit.    No Known Allergies  Family History  Problem Relation Age of Onset  . Arthritis Mother        Rheumatoid  . Diabetes Mother   . Hyperlipidemia Mother   . Diabetes Father   . Cancer Maternal Grandfather        Prostate  . Parkinson's disease Maternal Grandfather  Social History   Socioeconomic History  . Marital status: Married    Spouse name: Not on file  . Number of children: Not on file  . Years of education: Not on file  . Highest education level: Not on file  Occupational History  . Not on file  Tobacco Use  . Smoking status: Current Every Day Smoker    Packs/day: 1.50    Years: 20.00    Pack years: 30.00    Types: Cigarettes  . Smokeless tobacco: Never Used  Substance and Sexual Activity  . Alcohol use: Yes    Alcohol/week: 0.0 standard drinks    Comment: occasional  . Drug use: No    Types: Cocaine  . Sexual activity: Yes    Birth control/protection: None  Other Topics Concern  . Not on file  Social History Narrative  . Not on file   Social Determinants of Health    Financial Resource Strain:   . Difficulty of Paying Living Expenses: Not on file  Food Insecurity:   . Worried About Charity fundraiser in the Last Year: Not on file  . Ran Out of Food in the Last Year: Not on file  Transportation Needs:   . Lack of Transportation (Medical): Not on file  . Lack of Transportation (Non-Medical): Not on file  Physical Activity:   . Days of Exercise per Week: Not on file  . Minutes of Exercise per Session: Not on file  Stress:   . Feeling of Stress : Not on file  Social Connections:   . Frequency of Communication with Friends and Family: Not on file  . Frequency of Social Gatherings with Friends and Family: Not on file  . Attends Religious Services: Not on file  . Active Member of Clubs or Organizations: Not on file  . Attends Archivist Meetings: Not on file  . Marital Status: Not on file  Intimate Partner Violence:   . Fear of Current or Ex-Partner: Not on file  . Emotionally Abused: Not on file  . Physically Abused: Not on file  . Sexually Abused: Not on file     Constitutional: Denies fever, malaise, fatigue, headache or abrupt weight changes.  Respiratory: Denies difficulty breathing, shortness of breath, cough or sputum production.   Cardiovascular: Denies chest pain, chest tightness, palpitations or swelling in the hands or feet.  Gastrointestinal: Pt reports constipation, abdominal cramping, bloating, and rectal pressure. Denies vomiting, diarrhea.    Neurological: Denies dizziness, difficulty with memory, difficulty with speech or problems with balance and coordination.   No other specific complaints in a complete review of systems (except as listed in HPI above).  Objective:   Physical Exam BP 136/84   Pulse 92   Temp 97.9 F (36.6 C) (Temporal)   Wt 252 lb (114.3 kg)   SpO2 98%   BMI 36.68 kg/m   General: Appears his stated age, obese, in NAD.  Cardiovascular: Normal rate and rhythm. S1 and S2 noted. No murmurs,  rubs, or gallops noted. Pulmonary/chest: Normal effort and positive vesicular breath sounds. No respiratory distress. No rales, crackles, or rhonchi noted. Abdominal: Soft. Tender to palpation in left quadrants. Hypoactive bowel sounds in all quadrants. No distention or masses noted. Liver, spleen, and kidney nonpalpable.  Neuro: Alert and oriented.  Psych: Mood and affect normal. Behavior normal. Judgement and thought content normal.          Assessment & Plan:     Webb Silversmith, NP This visit occurred  during the SARS-CoV-2 public health emergency.  Safety protocols were in place, including screening questions prior to the visit, additional usage of staff PPE, and extensive cleaning of exam room while observing appropriate contact time as indicated for disinfecting solutions.

## 2019-03-05 NOTE — Assessment & Plan Note (Signed)
KUB today Consider Mirilax/Senna S vs Metoclopramide Encouraged high fiber diet

## 2019-03-05 NOTE — Assessment & Plan Note (Signed)
He has decided he does not want to see endocrinology anymore POCT A1C today Advised him I am unable to manage his diabetes as he is chronically uncontrolled Consume a low carb diet, exercise for weight loss Encouraged yearly eye exam Encouraged routine foot exams He declines flu shot Pneumovax UTD

## 2019-03-13 ENCOUNTER — Other Ambulatory Visit: Payer: Self-pay

## 2019-03-13 DIAGNOSIS — E1140 Type 2 diabetes mellitus with diabetic neuropathy, unspecified: Secondary | ICD-10-CM

## 2019-03-13 DIAGNOSIS — IMO0002 Reserved for concepts with insufficient information to code with codable children: Secondary | ICD-10-CM

## 2019-03-13 MED ORDER — GLIPIZIDE 10 MG PO TABS: 10.0000 mg | ORAL_TABLET | Freq: Two times a day (BID) | ORAL | 0 refills | Status: DC

## 2019-03-13 NOTE — Telephone Encounter (Signed)
Pt is requesting glipizide refill be sent to [facility] [address].

## 2019-05-04 ENCOUNTER — Other Ambulatory Visit: Payer: Self-pay

## 2019-05-04 DIAGNOSIS — E119 Type 2 diabetes mellitus without complications: Secondary | ICD-10-CM | POA: Diagnosis not present

## 2019-05-04 DIAGNOSIS — Z794 Long term (current) use of insulin: Secondary | ICD-10-CM | POA: Insufficient documentation

## 2019-05-04 DIAGNOSIS — Z79899 Other long term (current) drug therapy: Secondary | ICD-10-CM | POA: Diagnosis not present

## 2019-05-04 DIAGNOSIS — F17210 Nicotine dependence, cigarettes, uncomplicated: Secondary | ICD-10-CM | POA: Insufficient documentation

## 2019-05-04 DIAGNOSIS — I10 Essential (primary) hypertension: Secondary | ICD-10-CM | POA: Diagnosis not present

## 2019-05-04 DIAGNOSIS — Z7982 Long term (current) use of aspirin: Secondary | ICD-10-CM | POA: Insufficient documentation

## 2019-05-04 DIAGNOSIS — R109 Unspecified abdominal pain: Secondary | ICD-10-CM | POA: Insufficient documentation

## 2019-05-04 DIAGNOSIS — M5442 Lumbago with sciatica, left side: Secondary | ICD-10-CM | POA: Insufficient documentation

## 2019-05-04 DIAGNOSIS — I2510 Atherosclerotic heart disease of native coronary artery without angina pectoris: Secondary | ICD-10-CM | POA: Diagnosis not present

## 2019-05-04 DIAGNOSIS — M545 Low back pain: Secondary | ICD-10-CM | POA: Diagnosis not present

## 2019-05-04 NOTE — ED Triage Notes (Signed)
Pt to the er for back pain r/t a pop in his back. Pt states he coughed 2 days ago and felt a pop and the pain has increased at home. Pt reports limited ROM. Pt has been using a heating pad and motrin with little relief. Pt reports pain is on the left side of the back in his back near the left kidney. Pain is greatest with movement.

## 2019-05-05 ENCOUNTER — Emergency Department
Admission: EM | Admit: 2019-05-05 | Discharge: 2019-05-05 | Disposition: A | Discharge status: Home / Self Care | Payer: BC Managed Care – PPO | Attending: Emergency Medicine | Admitting: Emergency Medicine

## 2019-05-05 ENCOUNTER — Emergency Department: Payer: BC Managed Care – PPO

## 2019-05-05 DIAGNOSIS — M5442 Lumbago with sciatica, left side: Secondary | ICD-10-CM

## 2019-05-05 DIAGNOSIS — M549 Dorsalgia, unspecified: Secondary | ICD-10-CM

## 2019-05-05 DIAGNOSIS — N3289 Other specified disorders of bladder: Secondary | ICD-10-CM | POA: Diagnosis not present

## 2019-05-05 DIAGNOSIS — M545 Low back pain: Secondary | ICD-10-CM | POA: Diagnosis not present

## 2019-05-05 LAB — URINALYSIS, COMPLETE (UACMP) WITH MICROSCOPIC
Bilirubin Urine: NEGATIVE
Glucose, UA: >=500 mg/dL — AB
Hgb urine dipstick: NEGATIVE
Ketones, ur: NEGATIVE mg/dL
Leukocytes,Ua: NEGATIVE
Nitrite: NEGATIVE
Protein, ur: 30 mg/dL — AB
Specific Gravity, Urine: 1.028 (ref 1.005–1.030)
Squamous Epithelial / HPF: NONE SEEN (ref 0–5)
pH: 5 (ref 5.0–8.0)

## 2019-05-05 MED ORDER — CYCLOBENZAPRINE HCL 10 MG PO TABS: 10.0000 mg | ORAL_TABLET | Freq: Three times a day (TID) | ORAL | 0 refills | Status: DC | PRN

## 2019-05-05 MED ORDER — KETOROLAC TROMETHAMINE 30 MG/ML IJ SOLN
30.0000 mg | Freq: Once | INTRAMUSCULAR | Status: AC
  Administered 2019-05-05: 30 mg via INTRAVENOUS
  Filled 2019-05-05: qty 1

## 2019-05-05 MED ORDER — CYCLOBENZAPRINE HCL 10 MG PO TABS
10.0000 mg | ORAL_TABLET | Freq: Once | ORAL | Status: AC
  Administered 2019-05-05: 10 mg via ORAL
  Filled 2019-05-05: qty 1

## 2019-05-05 NOTE — ED Provider Notes (Signed)
[facility] Emergency Department Provider Note  ____________________________________________   First MD Initiated Contact with Patient 05/05/19 [phone]     (approximate)  I have reviewed the triage vital signs and the nursing notes.   HISTORY  Chief Complaint Back Pain    HPI Martin Mullen is a 39 y.o. male with below list of previous medical conditions presents to the emergency department secondary to left lower back pain x2 days that acutely worsened after the patient coughed and felt a pop in his back".  Patient states that current pain score is 8 out of 10 worse with any movement.  Patient states that he is tried a heating pad and Motrin at home without any relief.  Patient denies any fever afebrile on presentation.  Patient states that the pain radiates down his left leg and that he does have some weakness to the lower extremities with attempts at ambulation.  Patient denies any urinary or bowel incontinence       Past Medical History:  Diagnosis Date  . Acute transmural inferior wall MI ([place]) 08/28/2012   .5 x 12 mm Veri-flex stent non-DES.  Marland Kitchen Chicken pox   . Coronary artery disease    Inferior ST elevation myocardial infarction in July of 2014. Cardiac catheterization showed 95% distal RCA stenosis and 90% first diagonal stenosis with normal ejection fraction. He underwent PCI in bare-metal stent placement to the distal RCA.  . Diabetes mellitus without complication ([place])   . Hypercholesterolemia   . Tobacco use     Patient Active Problem List   Diagnosis Date Noted  . Chronic constipation 03/05/2019  . Coronary artery disease   . DM type 2, uncontrolled, with neuropathy ([place]) 07/09/2013  . STEMI s/p RCA stent 08/2012 09/05/2012  . HTN (hypertension) 09/05/2012  . HLD (hyperlipidemia) 09/05/2012  . Adjustment disorder with mixed anxiety and depressed mood 09/05/2012    Past Surgical History:  Procedure Laterality Date  . CORONARY  ANGIOPLASTY WITH STENT PLACEMENT    . LEFT HEART CATHETERIZATION WITH CORONARY ANGIOGRAM N/A 08/28/2012   Procedure: LEFT HEART CATHETERIZATION WITH CORONARY ANGIOGRAM;  Surgeon: Laverda Page, MD;  [facility] C[facility] LAB;  Service: Cardiovascular;  Laterality: N/A;    Prior to Admission medications   Medication Sig Start Date End Date Taking? Authorizing Provider  aspirin 81 MG EC tablet Take 1 tablet (81 mg total) by mouth daily. 09/05/12   Rai, Vernelle Emerald, MD  Blood Glucose Monitoring Suppl (BAYER CONTOUR NEXT MONITOR) w/Device KIT 1 Device 07/22/15   [name], NP  buPROPion (WELLBUTRIN XL) 150 MG 24 hr tablet Take 1 tablet (150 mg total) by mouth daily. 06/20/17   [name], NP  CONTOUR NEXT TEST test strip USE 1 STRIP TO CHECK GLUCOSE THREE TIMES DAILY AS NEEDED 09/05/18   [name], NP  cyclobenzaprine (FLEXERIL) 10 MG tablet Take 1 tablet (10 mg total) by mouth 3 (three) times daily as needed. 05/05/19   Gregor Hams, MD  glipiZIDE (GLUCOTROL) 10 MG tablet Take 1 tablet (10 mg total) by mouth 2 (two) times daily before a meal. 03/13/19   [name], [name], NP  Insulin Glargine (LANTUS SOLOSTAR) 100 UNIT/ML Solostar Pen Inject 23 Units into the skin daily. 11/22/18   [name], NP  Insulin Pen Needle 31G X 8 MM MISC 1 each by Does not apply route daily. 11/09/18   [name], NP  metFORMIN (GLUCOPHAGE) 1000 MG tablet TAKE 1 TABLET  BY MOUTH TWICE DAILY WITH A MEAL 02/25/19   [name], [name], NP  nitroGLYCERIN (NITROSTAT) 0.4 MG SL tablet DISSOLVE ONE TABLET UNDER THE TONGUE EVERY 5 MINUTES AS NEEDED FOR CHEST PAIN.  DO NOT EXCEED A TOTAL OF 3 DOSES IN 15 MINUTES 04/26/17   [name], NP  Omega-3 Fatty Acids (FISH OIL) 1200 MG CAPS Take 1,200 mg by mouth 3 (three) times daily.    [provider]  omeprazole (PRILOSEC) 20 MG capsule Take 1 capsule (20 mg total) by mouth daily. 11/12/18   [name], NP  simvastatin (ZOCOR) 80 MG tablet Take 1 tablet (80  mg total) by mouth daily. 09/27/18   [name], NP    Allergies Patient has no known allergies.  Family History  Problem Relation Age of Onset  . Arthritis Mother        Rheumatoid  . Diabetes Mother   . Hyperlipidemia Mother   . Diabetes Father   . Cancer Maternal Grandfather        Prostate  . Parkinson's disease Maternal Grandfather     Social History Social History   Tobacco Use  . Smoking status: Current Every Day Smoker    Packs/day: 1.50    Years: 20.00    Pack years: 30.00    Types: Cigarettes  . Smokeless tobacco: Never Used  Substance Use Topics  . Alcohol use: Yes    Alcohol/week: 0.0 standard drinks    Comment: occasional  . Drug use: Yes    Types: Marijuana    Review of Systems Constitutional: No fever/chills Eyes: No visual changes. ENT: No sore throat. Cardiovascular: Denies chest pain. Respiratory: Denies shortness of breath. Gastrointestinal: No abdominal pain.  No nausea, no vomiting.  No diarrhea.  No constipation. Genitourinary: Negative for dysuria. Musculoskeletal: Negative for neck pain.  Positive for back pain. Integumentary: Negative for rash. Neurological: Negative for headaches, focal weakness or numbness.   ____________________________________________   PHYSICAL EXAM:  VITAL SIGNS: ED Triage Vitals  Enc Vitals Group     BP 05/04/19 2330 (!) 166/97     Pulse Rate 05/04/19 2330 91     Resp 05/04/19 2330 18     Temp 05/04/19 2330 97.7 F (36.5 C)     Temp Source 05/04/19 2330 Oral     SpO2 05/04/19 2330 100 %     Weight 05/04/19 2331 113.4 kg (250 lb)     Height 05/04/19 2331 1.791 m (5' 10.5")     Head Circumference --      Peak Flow --      Pain Score 05/04/19 2330 10     Pain Loc --      Pain Edu? --      Excl. in [place]? --     Constitutional: Alert and oriented.  Eyes: Conjunctivae are normal.  Mouth/Throat: Patient is wearing a mask. Neck: No stridor.  No meningeal signs.   Cardiovascular: Normal rate,  regular rhythm. Good peripheral circulation. Grossly normal heart sounds. Respiratory: Normal respiratory effort.  No retractions. Gastrointestinal: Soft and nontender. No distention.  Musculoskeletal: Left lumbar paraspinal muscle tenderness to palpation. Neurologic:  Normal speech and language. No gross focal neurologic deficits are appreciated.  Skin:  Skin is warm, dry and intact. Psychiatric: Mood and affect are normal. Speech and behavior are normal.  ____________________________________________   LABS (all labs ordered are listed, but only abnormal results are displayed)  Labs Reviewed  URINALYSIS, COMPLETE (UACMP) WITH MICROSCOPIC - Abnormal; Notable  for the following components:      Result Value   Color, Urine YELLOW (*)    APPearance HAZY (*)    Glucose, UA >=500 (*)    Protein, ur 30 (*)    Bacteria, UA RARE (*)    All other components within normal limits   ________  RADIOLOGY I, [place] N Alexei Ey, personally viewed and evaluated these images (plain radiographs) as part of my medical decision making, as well as reviewing the written report by the radiologist.  ED MD interpretation: Mild degenerative changes L4-L5 and L3-S1 on CT lumbar spine per radiologist.  Official radiology report(s): CT L-SPINE NO CHARGE  Result Date: 05/05/2019 CLINICAL DATA:  Coughed 2 days prior, felt pop in the back with increasing right back pain EXAM: CT LUMBAR SPINE WITHOUT CONTRAST TECHNIQUE: Multidetector CT imaging of the lumbar spine was performed without intravenous contrast administration. Multiplanar CT image reconstructions were also generated. COMPARISON:  Concurrent CT renal colic, lumbar radiographs 10/30/2010 FINDINGS: Segmentation: 5 lumbar type vertebral bodies. Alignment: Preservation of the normal lumbar lordosis. No spondylolisthesis or spondylolysis. Vertebrae: No acute vertebral body fracture or compression deformity is seen. No suspicious osseous lesions. Few Schmorl's node  formations are noted T11, T12, L4. Posterior elements and transverse processes are intact. No abnormally widened facets. Paraspinal and other soft tissues: No paravertebral fluid, swelling, gas or hemorrhage. For findings in the abdomen and pelvis, please see dedicated concurrent CT. Disc levels: Level by level evaluation of the lumbar spine below: T11-T12: Mild disc height loss with likely desiccation and Schmorl's node formations. Mild bilateral facet arthropathy. No significant spinal canal or foraminal stenosis. T12-L1: Moderate disc height loss with minimal discogenic change and facet arthropathy. No significant spinal canal or foraminal stenosis. L1-L2: Mild disc height loss with minimal discogenic and facet degenerative change. No significant spinal canal or foraminal stenosis. L2-L3: Mild disc height loss without significant posterior disc abnormality. Mild bilateral facet degenerative change. No significant canal stenosis. Mild bilateral foraminal narrowing. L3-L4: Mild disc height loss and minimal global disc bulge. Moderate bilateral facet arthropathy. No significant canal stenosis. Mild foraminal narrowing. L4-L5: Mild disc height loss and moderate global disc bulge. Moderate bilateral facet arthropathy. Mild canal stenosis and bilateral neural foraminal narrowing. L5-S1: Mild disc height loss with moderate global disc bulge. Mild bilateral facet arthropathy. No significant canal stenosis. Mild bilateral foraminal narrowing right slightly greater than left. IMPRESSION: 1. No acute fracture or vertebral body height loss. No suspicious osseous abnormality. 2. Mild degenerative change. At most mild canal stenosis at L4-5. Mild multilevel foraminal narrowing L3-S1. 3. For findings in the abdomen and pelvis, please see dedicated CT performed concurrently. Electronically Signed   By: Lovena Le M.D.   On: 05/05/2019 02:27   CT Renal Stone Study  Result Date: 05/05/2019 CLINICAL DATA:  Back pain, audible  pop in the right back with increasing pain EXAM: CT ABDOMEN AND PELVIS WITHOUT CONTRAST TECHNIQUE: Multidetector CT imaging of the abdomen and pelvis was performed following the standard protocol without IV contrast. COMPARISON:  Abdominal radiograph 03/05/2019 FINDINGS: Lower chest: Bandlike atelectatic changes noted in lingula and right lower lobe. No consolidation or effusion. Age advanced coronary artery calcifications are present with likely coronary stent. Normal heart size. No pericardial effusion. Hepatobiliary: No focal liver abnormality is seen. No gallstones, gallbladder wall thickening, or biliary dilatation. Pancreas: Unremarkable. No pancreatic ductal dilatation or surrounding inflammatory changes. Spleen: Normal in size without focal abnormality. Adrenals/Urinary Tract: Normal adrenal glands. Mild bilateral perinephric stranding. No  visible or contour deforming renal lesions. No urolithiasis or hydronephrosis. Mild circumferential bladder wall thickening with faint perivesicular hazy stranding. Stomach/Bowel: Distal esophagus is unremarkable. Stomach is distended with ingested material. Duodenal sweep takes a normal course across the midline abdomen. No small bowel dilatation or wall thickening. A normal appendix is visualized. No colonic dilatation or wall thickening. Vascular/Lymphatic: There is extensive, age advanced aortic atherosclerosis. No aneurysm or ectasia No suspicious or enlarged lymph nodes in the included lymphatic chains. Reproductive: Asymmetric coarse calcification of the left seminal vesicle. Dense calcification of the vas deferens is present as well. Finding often seen in diabetes. Minimal prostate calcification. No suspicious lesions. Other: No abdominopelvic free fluid or free gas. No bowel containing hernias. 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 No bowel containing hernias. Small bilateral fat containing  inguinal hernias, left greater than right. Mild bilateral gynecomastia. Musculoskeletal: Multilevel degenerative changes are present in the imaged portions of the spine. No acute osseous abnormality or suspicious osseous lesion. No worrisome osseous lesions. Dedicated lumbar reconstructions were generated and dictated separately. IMPRESSION: 1. No urolithiasis or hydronephrosis. 2. Mild circumferential bladder wall thickening with faint perivesicular hazy stranding as well as bilateral perinephric stranding. Should correlate with urinalysis to exclude cystitis and possible ascending tract infection. 3. Age advanced coronary artery calcifications with coronary stent. Additional age advanced aortic atherosclerosis as well. ( Aortic Atherosclerosis (ICD10-I70.0).) 4. Dense calcification of the vas deferens is present as well. Finding often seen in diabetes. 5. 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 6. Dedicated lumbar spine reconstructions generated and dictated separately. Please see report for further details. Electronically Signed   By: Lovena Le M.D.   On: 05/05/2019 02:18    ____________________________________________    Procedures   ____________________________________________   INITIAL IMPRESSION / MDM / [place] / ED COURSE  As part of my medical decision making, I reviewed the following data within the electronic MEDICAL RECORD NUMBER 39 year old male presented with above-stated history and physical exam consistent with lumbar radiculopathy which was confirmed on CT.  Patient given Toradol IV and Flexeril in the emergency department will be prescribed Flexeril for home.     ____________________________________________  FINAL CLINICAL IMPRESSION(S) / ED DIAGNOSES  Final diagnoses:  Back pain  Acute left-sided low back pain with left-sided sciatica     MEDICATIONS GIVEN DURING THIS VISIT:  Medications    ketorolac (TORADOL) 30 MG/ML injection 30 mg (30 mg Intravenous Given 05/05/19 0135)  cyclobenzaprine (FLEXERIL) tablet 10 mg (10 mg Oral Given 05/05/19 0134)     ED Discharge Orders         Ordered    cyclobenzaprine (FLEXERIL) 10 MG tablet  3 times daily PRN     05/05/19 0251          *Please note:  Martin Mullen was evaluated in Emergency Department on 05/05/2019 for the symptoms described in the history of present illness. He was evaluated in the context of the global COVID-19 pandemic, which necessitated consideration that the patient might be at risk for infection with the SARS-CoV-2 virus that causes COVID-19. Institutional protocols and algorithms that pertain to the evaluation of patients at risk for COVID-19 are in a state of rapid change based on information released by regulatory bodies including the CDC and federal and state organizations. These policies and algorithms were followed during the patient's care in the ED.  Some ED evaluations and interventions  may be delayed as a result of limited staffing during the pandemic.*  Note:  This document was prepared using Dragon voice recognition software and may include unintentional dictation errors.   Gregor Hams, MD 05/05/19 [phone]

## 2019-05-05 NOTE — ED Notes (Signed)
Upon approaching this hall bed patient for discharge, this nurse found him fully dressed and sitting in a wheelchair by his bed with his jacket on. Pt removed right arm from jacket for vitals. Pt was wheeled out to the parking lot and entered his car. After returning to the ED to complete his discharge in Epic, it was noted that he had an IV and it was not removed. Attempted to call the patient back 5 times, all going to voice mail. Message was left to return to the ED or call to discuss options. ED charge nurse notified.

## 2019-05-09 ENCOUNTER — Other Ambulatory Visit: Payer: Self-pay

## 2019-05-09 ENCOUNTER — Encounter: Payer: Self-pay | Admitting: Internal Medicine

## 2019-05-09 ENCOUNTER — Ambulatory Visit: Payer: BC Managed Care – PPO | Admitting: Internal Medicine

## 2019-05-09 VITALS — BP 142/90 | HR 110 | Temp 98.4°F | Wt 248.0 lb

## 2019-05-09 DIAGNOSIS — M5442 Lumbago with sciatica, left side: Secondary | ICD-10-CM | POA: Diagnosis not present

## 2019-05-09 MED ORDER — NAPROXEN 500 MG PO TABS: 500.0000 mg | ORAL_TABLET | Freq: Two times a day (BID) | ORAL | 0 refills | Status: DC

## 2019-05-09 MED ORDER — DEXAMETHASONE SODIUM PHOSPHATE 10 MG/ML IJ SOLN
10.0000 mg | Freq: Once | INTRAMUSCULAR | Status: AC
  Administered 2019-05-09: 16:00:00 10 mg via INTRAMUSCULAR

## 2019-05-09 NOTE — Addendum Note (Signed)
Addended by: Roena Malady on: 05/09/2019 03:45 PM   Modules accepted: Orders

## 2019-05-09 NOTE — Progress Notes (Signed)
Subjective:    Patient ID: Martin Mullen, male    DOB: 1981/01/16, 39 y.o.   MRN: [id number]  HPI  Pt presents to the clinic today for ER follow up. He went to the ER 3/21 for evaluation of back pain. He reports the pain had started 2 days prior but worsened after he cough and felt a pop in his back. He describes the pain as stabbing. The pain radiates across his lower back. He denies numbness, tingling or weakness. CT renal stone study showed:   IMPRESSION: 1. No urolithiasis or hydronephrosis. 2. Mild circumferential bladder wall thickening with faint perivesicular hazy stranding as well as bilateral perinephric stranding. Should correlate with urinalysis to exclude cystitis and possible ascending tract infection. 3. Age advanced coronary artery calcifications with coronary stent. Additional age advanced aortic atherosclerosis as well. ( Aortic Atherosclerosis (ICD10-I70.0).) 4. Dense calcification of the vas deferens is present as well. Finding often seen in diabetes. 5. 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 6. Dedicated lumbar spine reconstructions generated and dictatedseparately. Please see report for further details.  CT Lumbar Spine showed:  IMPRESSION: 1. No acute fracture or vertebral body height loss. No suspicious osseous abnormality. 2. Mild degenerative change. At most mild canal stenosis at L4-5. Mild multilevel foraminal narrowing L3-S1. 3. For findings in the abdomen and pelvis, please see dedicated CT performed concurrently.  He was given Toradol and Flexeril in the ER. He was discharged with a RX for Flexeril. Since discharge, he reports some improvement in pain. Pain is worse getting from a sitting to a standing position, walking or standing for long periods of time.  Review of Systems      Past Medical History:  Diagnosis Date  . Acute transmural inferior wall MI ([place]) 08/28/2012   .5 x 12 mm  Veri-flex stent non-DES.  Marland Kitchen Chicken pox   . Coronary artery disease    Inferior ST elevation myocardial infarction in July of 2014. Cardiac catheterization showed 95% distal RCA stenosis and 90% first diagonal stenosis with normal ejection fraction. He underwent PCI in bare-metal stent placement to the distal RCA.  . Diabetes mellitus without complication ([place])   . Hypercholesterolemia   . Tobacco use     Current Outpatient Medications  Medication Sig Dispense Refill  . aspirin 81 MG EC tablet Take 1 tablet (81 mg total) by mouth daily. 30 tablet 4  . Blood Glucose Monitoring Suppl (BAYER CONTOUR NEXT MONITOR) w/Device KIT 1 Device 1 kit 0  . buPROPion (WELLBUTRIN XL) 150 MG 24 hr tablet Take 1 tablet (150 mg total) by mouth daily. 90 tablet 3  . CONTOUR NEXT TEST test strip USE 1 STRIP TO CHECK GLUCOSE THREE TIMES DAILY AS NEEDED 100 each 0  . cyclobenzaprine (FLEXERIL) 10 MG tablet Take 1 tablet (10 mg total) by mouth 3 (three) times daily as needed. 30 tablet 0  . glipiZIDE (GLUCOTROL) 10 MG tablet Take 1 tablet (10 mg total) by mouth 2 (two) times daily before a meal. 90 tablet 0  . Insulin Glargine (LANTUS SOLOSTAR) 100 UNIT/ML Solostar Pen Inject 23 Units into the skin daily. 15 pen 1  . Insulin Pen Needle 31G X 8 MM MISC 1 each by Does not apply route daily. 90 each 1  . metFORMIN (GLUCOPHAGE) 1000 MG tablet TAKE 1 TABLET BY MOUTH TWICE DAILY WITH A MEAL 180 tablet 0  . nitroGLYCERIN (NITROSTAT) 0.4 MG SL tablet DISSOLVE ONE TABLET UNDER THE  TONGUE EVERY 5 MINUTES AS NEEDED FOR CHEST PAIN.  DO NOT EXCEED A TOTAL OF 3 DOSES IN 15 MINUTES 25 tablet 12  . Omega-3 Fatty Acids (FISH OIL) 1200 MG CAPS Take 1,200 mg by mouth 3 (three) times daily.    Marland Kitchen omeprazole (PRILOSEC) 20 MG capsule Take 1 capsule (20 mg total) by mouth daily. 30 capsule 3  . simvastatin (ZOCOR) 80 MG tablet Take 1 tablet (80 mg total) by mouth daily. 90 tablet 3   No current facility-administered medications for this  visit.    No Known Allergies  Family History  Problem Relation Age of Onset  . Arthritis Mother        Rheumatoid  . Diabetes Mother   . Hyperlipidemia Mother   . Diabetes Father   . Cancer Maternal Grandfather        Prostate  . Parkinson's disease Maternal Grandfather     Social History   Socioeconomic History  . Marital status: Married    Spouse name: Not on file  . Number of children: Not on file  . Years of education: Not on file  . Highest education level: Not on file  Occupational History  . Not on file  Tobacco Use  . Smoking status: Current Every Day Smoker    Packs/day: 1.50    Years: 20.00    Pack years: 30.00    Types: Cigarettes  . Smokeless tobacco: Never Used  Substance and Sexual Activity  . Alcohol use: Yes    Alcohol/week: 0.0 standard drinks    Comment: occasional  . Drug use: Yes    Types: Marijuana  . Sexual activity: Yes    Birth control/protection: None  Other Topics Concern  . Not on file  Social History Narrative  . Not on file   Social Determinants of Health   Financial Resource Strain:   . Difficulty of Paying Living Expenses:   Food Insecurity:   . Worried About Charity fundraiser in the Last Year:   . Arboriculturist in the Last Year:   Transportation Needs:   . Film/video editor (Medical):   Marland Kitchen Lack of Transportation (Non-Medical):   Physical Activity:   . Days of Exercise per Week:   . Minutes of Exercise per Session:   Stress:   . Feeling of Stress :   Social Connections:   . Frequency of Communication with Friends and Family:   . Frequency of Social Gatherings with Friends and Family:   . Attends Religious Services:   . Active Member of Clubs or Organizations:   . Attends Archivist Meetings:   Marland Kitchen Marital Status:   Intimate Partner Violence:   . Fear of Current or Ex-Partner:   . Emotionally Abused:   Marland Kitchen Physically Abused:   . Sexually Abused:      Constitutional: Denies fever, malaise, fatigue,  headache or abrupt weight changes.  Respiratory: Denies difficulty breathing, shortness of breath, cough or sputum production.   Cardiovascular: Denies chest pain, chest tightness, palpitations or swelling in the hands or feet.  Gastrointestinal: Denies loss of bowel control. GU: Denies loss of bladder control. Musculoskeletal: Pt reports back pain, decreased ROM. Denies  difficulty with gait, or joint swelling.  Skin: Denies redness, rashes, lesions or ulcercations.  Neurological: Denies numbness, tingling, weakness or problems with balance and coordination.   No other specific complaints in a complete review of systems (except as listed in HPI above).  Objective:   Physical Exam  BP (!) 142/90   Pulse (!) 110   Temp 98.4 F (36.9 C) (Temporal)   Wt 248 lb (112.5 kg)   SpO2 98%   BMI 35.08 kg/m   Wt Readings from Last 3 Encounters:  05/04/19 250 lb (113.4 kg)  03/05/19 252 lb (114.3 kg)  09/26/18 229 lb (103.9 kg)    General: Appears her stated age, obese, in NAD. Cardiovascular: Tachycardic with normal rhythm.  No JVD or BLE edema. Pedal pulses 2+ bilaterally. Pulmonary/Chest: Normal effort and positive vesicular breath sounds. No respiratory distress. No wheezes, rales or ronchi noted.  Musculoskeletal: Decreased flexion, extension, lateral bending and rotation of the lumbar spine. Pian with palpation over the lumbar spine. Strength 4/5 LLE, 5/5RLE. Can stand on tiptoes, difficulty standing on heels. No difficulty with gait. Neurological: Alert and oriented. Positive SLR on the left at 15 degrees.   BMET    Component Value Date/Time   NA 141 09/26/2018 1547   NA 139 01/29/2013 0040   K 4.5 09/26/2018 1547   K 4.0 01/29/2013 0040   CL 101 09/26/2018 1547   CL 107 01/29/2013 0040   CO2 25 09/26/2018 1547   CO2 28 01/29/2013 0040   GLUCOSE 292 (H) 09/26/2018 1547   GLUCOSE 485 (H) 06/20/2017 1510   GLUCOSE 228 (H) 01/29/2013 0040   BUN 11 09/26/2018 1547   BUN 15  01/29/2013 0040   CREATININE 0.90 09/26/2018 1547   CREATININE 0.88 01/29/2013 0040   CALCIUM 9.6 09/26/2018 1547   CALCIUM 9.2 01/29/2013 0040   GFRNONAA 108 09/26/2018 1547   GFRNONAA >60 01/29/2013 0040   GFRAA 125 09/26/2018 1547   GFRAA >60 01/29/2013 0040    Lipid Panel     Component Value Date/Time   CHOL 163 09/26/2018 1547   TRIG 176 (H) 09/26/2018 1547   HDL 39 (L) 09/26/2018 1547   CHOLHDL 4.2 09/26/2018 1547   CHOLHDL 6 06/20/2017 1510   VLDL 50.2 (H) 06/20/2017 1510   LDLCALC 89 09/26/2018 1547    CBC    Component Value Date/Time   WBC 11.8 (H) 09/26/2018 1547   WBC 10.9 (H) 06/20/2017 1510   RBC 5.19 09/26/2018 1547   RBC 5.41 06/20/2017 1510   HGB 16.8 09/26/2018 1547   HCT 49.4 09/26/2018 1547   PLT 220 09/26/2018 1547   MCV 95 09/26/2018 1547   MCV 93 01/29/2013 0040   MCH 32.4 09/26/2018 1547   MCH 31.9 03/19/2017 0325   MCHC 34.0 09/26/2018 1547   MCHC 35.1 06/20/2017 1510   RDW 12.2 09/26/2018 1547   RDW 13.8 01/29/2013 0040   LYMPHSABS 3.4 03/18/2017 1823   MONOABS 1.3 (H) 03/18/2017 1823   EOSABS 0.2 03/18/2017 1823   BASOSABS 0.1 03/18/2017 1823    Hgb A1C Lab Results  Component Value Date   HGBA1C 7.5 (A) 03/05/2019           Assessment & Plan:   ER Follow Up for Acute Low Back Pain with Left Sided Sciatica:  ER notes, labs and imaging reviewed Encouraged him to try ice instead of heat Decadron 10 mg IM today- monitor sugars as they will be elevated RX for Naproxen 500 mg BID with food x 1 week Continue Flexeril Referral to PT placed Stretching exercises given   Return precautions discussed Webb Silversmith, NP This visit occurred during the SARS-CoV-2 public health emergency.  Safety protocols were in place, including screening questions prior to the visit, additional usage of staff PPE, and extensive  cleaning of exam room while observing appropriate contact time as indicated for disinfecting solutions.

## 2019-05-09 NOTE — Patient Instructions (Signed)
Sciatica Rehab Ask your health care provider which exercises are safe for you. Do exercises exactly as told by your health care provider and adjust them as directed. It is normal to feel mild stretching, pulling, tightness, or discomfort as you do these exercises. Stop right away if you feel sudden pain or your pain gets worse. Do not begin these exercises until told by your health care provider. Stretching and range-of-motion exercises These exercises warm up your muscles and joints and improve the movement and flexibility of your hips and back. These exercises also help to relieve pain, numbness, and tingling. Sciatic nerve glide 1. Sit in a chair with your head facing down toward your chest. Place your hands behind your back. Let your shoulders slump forward. 2. Slowly straighten one of your legs while you tilt your head back as if you are looking toward the ceiling. Only straighten your leg as far as you can without making your symptoms worse. 3. Hold this position for __________ seconds. 4. Slowly return to the starting position. 5. Repeat with your other leg. Repeat __________ times. Complete this exercise __________ times a day. Knee to chest with hip adduction and internal rotation  1. Lie on your back on a firm surface with both legs straight. 2. Bend one of your knees and move it up toward your chest until you feel a gentle stretch in your lower back and buttock. Then, move your knee toward the shoulder that is on the opposite side from your leg. This is hip adduction and internal rotation. ? Hold your leg in this position by holding on to the front of your knee. 3. Hold this position for __________ seconds. 4. Slowly return to the starting position. 5. Repeat with your other leg. Repeat __________ times. Complete this exercise __________ times a day. Prone extension on elbows  1. Lie on your abdomen on a firm surface. A bed may be too soft for this exercise. 2. Prop yourself up on  your elbows. 3. Use your arms to help lift your chest up until you feel a gentle stretch in your abdomen and your lower back. ? This will place some of your body weight on your elbows. If this is uncomfortable, try stacking pillows under your chest. ? Your hips should stay down, against the surface that you are lying on. Keep your hip and back muscles relaxed. 4. Hold this position for __________ seconds. 5. Slowly relax your upper body and return to the starting position. Repeat __________ times. Complete this exercise __________ times a day. Strengthening exercises These exercises build strength and endurance in your back. Endurance is the ability to use your muscles for a long time, even after they get tired. Pelvic tilt This exercise strengthens the muscles that lie deep in the abdomen. 1. Lie on your back on a firm surface. Bend your knees and keep your feet flat on the floor. 2. Tense your abdominal muscles. Tip your pelvis up toward the ceiling and flatten your lower back into the floor. ? To help with this exercise, you may place a small towel under your lower back and try to push your back into the towel. 3. Hold this position for __________ seconds. 4. Let your muscles relax completely before you repeat this exercise. Repeat __________ times. Complete this exercise __________ times a day. Alternating arm and leg raises  1. Get on your hands and knees on a firm surface. If you are on a hard floor, you may want to use   padding, such as an exercise mat, to cushion your knees. 2. Line up your arms and legs. Your hands should be directly below your shoulders, and your knees should be directly below your hips. 3. Lift your left leg behind you. At the same time, raise your right arm and straighten it in front of you. ? Do not lift your leg higher than your hip. ? Do not lift your arm higher than your shoulder. ? Keep your abdominal and back muscles tight. ? Keep your hips facing the  ground. ? Do not arch your back. ? Keep your balance carefully, and do not hold your breath. 4. Hold this position for __________ seconds. 5. Slowly return to the starting position. 6. Repeat with your right leg and your left arm. Repeat __________ times. Complete this exercise __________ times a day. Posture and body mechanics Good posture and healthy body mechanics can help to relieve stress in your body's tissues and joints. Body mechanics refers to the movements and positions of your body while you do your daily activities. Posture is part of body mechanics. Good posture means:  Your spine is in its natural S-curve position (neutral).  Your shoulders are pulled back slightly.  Your head is not tipped forward. Follow these guidelines to improve your posture and body mechanics in your everyday activities. Standing   When standing, keep your spine neutral and your feet about hip width apart. Keep a slight bend in your knees. Your ears, shoulders, and hips should line up.  When you do a task in which you stand in one place for a long time, place one foot up on a stable object that is 2-4 inches (5-10 cm) high, such as a footstool. This helps keep your spine neutral. Sitting   When sitting, keep your spine neutral and keep your feet flat on the floor. Use a footrest, if necessary, and keep your thighs parallel to the floor. Avoid rounding your shoulders, and avoid tilting your head forward.  When working at a desk or a computer, keep your desk at a height where your hands are slightly lower than your elbows. Slide your chair under your desk so you are close enough to maintain good posture.  When working at a computer, place your monitor at a height where you are looking straight ahead and you do not have to tilt your head forward or downward to look at the screen. Resting  When lying down and resting, avoid positions that are most painful for you.  If you have pain with activities  such as sitting, bending, stooping, or squatting, lie in a position in which your body does not bend very much. For example, avoid curling up on your side with your arms and knees near your chest (fetal position).  If you have pain with activities such as standing for a long time or reaching with your arms, lie with your spine in a neutral position and bend your knees slightly. Try the following positions: ? Lying on your side with a pillow between your knees. ? Lying on your back with a pillow under your knees. Lifting   When lifting objects, keep your feet at least shoulder width apart and tighten your abdominal muscles.  Bend your knees and hips and keep your spine neutral. It is important to lift using the strength of your legs, not your back. Do not lock your knees straight out.  Always ask for help to lift heavy or awkward objects. This information is not   intended to replace advice given to you by your health care provider. Make sure you discuss any questions you have with your health care provider. Document Revised: 05/25/2018 Document Reviewed: 02/22/2018 Elsevier Patient Education  2020 [employer]  

## 2019-05-24 ENCOUNTER — Other Ambulatory Visit: Payer: Self-pay | Admitting: *Deleted

## 2019-05-24 ENCOUNTER — Other Ambulatory Visit: Payer: Self-pay | Admitting: Internal Medicine

## 2019-05-24 MED ORDER — LANTUS SOLOSTAR 100 UNIT/ML ~~LOC~~ SOPN: 23.0000 [IU] | PEN_INJECTOR | Freq: Every day | SUBCUTANEOUS | 1 refills | Status: DC

## 2019-05-29 DIAGNOSIS — M545 Low back pain: Secondary | ICD-10-CM | POA: Diagnosis not present

## 2019-06-02 DIAGNOSIS — Z716 Tobacco abuse counseling: Secondary | ICD-10-CM | POA: Diagnosis not present

## 2019-06-02 DIAGNOSIS — Z794 Long term (current) use of insulin: Secondary | ICD-10-CM | POA: Diagnosis not present

## 2019-06-02 DIAGNOSIS — I10 Essential (primary) hypertension: Secondary | ICD-10-CM | POA: Diagnosis not present

## 2019-06-02 DIAGNOSIS — F1410 Cocaine abuse, uncomplicated: Secondary | ICD-10-CM | POA: Diagnosis not present

## 2019-06-02 DIAGNOSIS — Z6835 Body mass index (BMI) 35.0-35.9, adult: Secondary | ICD-10-CM | POA: Diagnosis not present

## 2019-06-02 DIAGNOSIS — I252 Old myocardial infarction: Secondary | ICD-10-CM | POA: Diagnosis not present

## 2019-06-02 DIAGNOSIS — Z7982 Long term (current) use of aspirin: Secondary | ICD-10-CM | POA: Diagnosis not present

## 2019-06-02 DIAGNOSIS — Z20822 Contact with and (suspected) exposure to covid-19: Secondary | ICD-10-CM | POA: Diagnosis not present

## 2019-06-02 DIAGNOSIS — R079 Chest pain, unspecified: Secondary | ICD-10-CM | POA: Diagnosis not present

## 2019-06-02 DIAGNOSIS — F17213 Nicotine dependence, cigarettes, with withdrawal: Secondary | ICD-10-CM | POA: Diagnosis not present

## 2019-06-02 DIAGNOSIS — J984 Other disorders of lung: Secondary | ICD-10-CM | POA: Diagnosis not present

## 2019-06-02 DIAGNOSIS — I2510 Atherosclerotic heart disease of native coronary artery without angina pectoris: Secondary | ICD-10-CM | POA: Diagnosis not present

## 2019-06-02 DIAGNOSIS — F4322 Adjustment disorder with anxiety: Secondary | ICD-10-CM | POA: Diagnosis not present

## 2019-06-02 DIAGNOSIS — R0789 Other chest pain: Secondary | ICD-10-CM | POA: Diagnosis not present

## 2019-06-02 DIAGNOSIS — E785 Hyperlipidemia, unspecified: Secondary | ICD-10-CM | POA: Diagnosis not present

## 2019-06-02 DIAGNOSIS — E1140 Type 2 diabetes mellitus with diabetic neuropathy, unspecified: Secondary | ICD-10-CM | POA: Diagnosis not present

## 2019-06-03 DIAGNOSIS — I10 Essential (primary) hypertension: Secondary | ICD-10-CM | POA: Diagnosis not present

## 2019-06-03 DIAGNOSIS — R0789 Other chest pain: Secondary | ICD-10-CM | POA: Diagnosis not present

## 2019-06-03 DIAGNOSIS — E119 Type 2 diabetes mellitus without complications: Secondary | ICD-10-CM | POA: Diagnosis not present

## 2019-06-03 DIAGNOSIS — R079 Chest pain, unspecified: Secondary | ICD-10-CM | POA: Diagnosis not present

## 2019-06-03 DIAGNOSIS — E785 Hyperlipidemia, unspecified: Secondary | ICD-10-CM | POA: Diagnosis not present

## 2019-06-03 DIAGNOSIS — F17210 Nicotine dependence, cigarettes, uncomplicated: Secondary | ICD-10-CM | POA: Diagnosis not present

## 2019-06-03 DIAGNOSIS — I2589 Other forms of chronic ischemic heart disease: Secondary | ICD-10-CM | POA: Diagnosis not present

## 2019-06-03 MED ORDER — NICOTINE 21 MG/24HR TD PT24
1.00 | MEDICATED_PATCH | TRANSDERMAL | Status: DC
Start: 2019-06-04 — End: 2019-06-03

## 2019-06-03 MED ORDER — NEXTERONE IV
81.00 | INTRAVENOUS | Status: DC
Start: ? — End: 2019-06-03

## 2019-06-03 MED ORDER — Medication
15.00 | Status: DC
Start: 2019-06-03 — End: 2019-06-03

## 2019-06-03 MED ORDER — BENICAR 20 MG PO TABS
12.50 | ORAL_TABLET | ORAL | Status: DC
Start: ? — End: 2019-06-03

## 2019-06-03 MED ORDER — RA COLD/COUGH DM 15-1-5 MG/5ML PO ELIX
80.00 | ORAL_SOLUTION | ORAL | Status: DC
Start: 2019-06-04 — End: 2019-06-03

## 2019-06-03 MED ORDER — MAVIK 1 MG PO TABS
1.00 | ORAL_TABLET | ORAL | Status: DC
Start: ? — End: 2019-06-03

## 2019-06-03 MED ORDER — CVS B-6 PO
17.00 | ORAL | Status: DC
Start: 2019-06-04 — End: 2019-06-03

## 2019-06-03 MED ORDER — CHLOROBUTANOL-EUGENOL & APAP
0.40 | Status: DC
Start: ? — End: 2019-06-03

## 2019-06-03 MED ORDER — COMPOUND W FREEZE OFF EX AERO
0.00 | INHALATION_SPRAY | CUTANEOUS | Status: DC
Start: 2019-06-03 — End: 2019-06-03

## 2019-06-04 ENCOUNTER — Telehealth: Payer: Self-pay

## 2019-06-04 NOTE — Telephone Encounter (Signed)
Fax received from [facility] req. signature from [name], NP-C for plan of care  Form has been faxed

## 2019-06-05 DIAGNOSIS — M545 Low back pain: Secondary | ICD-10-CM | POA: Diagnosis not present

## 2019-06-11 ENCOUNTER — Encounter: Payer: Self-pay | Admitting: Internal Medicine

## 2019-06-11 ENCOUNTER — Other Ambulatory Visit: Payer: Self-pay

## 2019-06-11 ENCOUNTER — Telehealth: Payer: Self-pay

## 2019-06-11 ENCOUNTER — Ambulatory Visit: Payer: BC Managed Care – PPO | Admitting: Internal Medicine

## 2019-06-11 VITALS — BP 146/98 | HR 93 | Temp 97.8°F | Wt 245.0 lb

## 2019-06-11 DIAGNOSIS — S91302A Unspecified open wound, left foot, initial encounter: Secondary | ICD-10-CM

## 2019-06-11 DIAGNOSIS — E782 Mixed hyperlipidemia: Secondary | ICD-10-CM

## 2019-06-11 DIAGNOSIS — I10 Essential (primary) hypertension: Secondary | ICD-10-CM | POA: Diagnosis not present

## 2019-06-11 DIAGNOSIS — I2510 Atherosclerotic heart disease of native coronary artery without angina pectoris: Secondary | ICD-10-CM

## 2019-06-11 DIAGNOSIS — I213 ST elevation (STEMI) myocardial infarction of unspecified site: Secondary | ICD-10-CM

## 2019-06-11 DIAGNOSIS — E1165 Type 2 diabetes mellitus with hyperglycemia: Secondary | ICD-10-CM

## 2019-06-11 DIAGNOSIS — E1140 Type 2 diabetes mellitus with diabetic neuropathy, unspecified: Secondary | ICD-10-CM

## 2019-06-11 DIAGNOSIS — IMO0002 Reserved for concepts with insufficient information to code with codable children: Secondary | ICD-10-CM

## 2019-06-11 MED ORDER — SITAGLIPTIN PHOSPHATE 100 MG PO TABS: 100.0000 mg | ORAL_TABLET | Freq: Every day | ORAL | 0 refills | Status: DC

## 2019-06-11 MED ORDER — LOSARTAN POTASSIUM 50 MG PO TABS: 50.0000 mg | ORAL_TABLET | Freq: Every day | ORAL | 0 refills | Status: DC

## 2019-06-11 MED ORDER — SULFAMETHOXAZOLE-TRIMETHOPRIM 800-160 MG PO TABS: 1.0000 | ORAL_TABLET | Freq: Two times a day (BID) | ORAL | 0 refills | Status: DC

## 2019-06-11 NOTE — Progress Notes (Signed)
Subjective:    Patient ID: Martin Mullen, male    DOB: 1980/05/29, 39 y.o.   MRN: [id number]  HPI  Patient presents to the clinic today for hospital follow-up.  He went to [facility] ER for/complaint of chest pain.  Labs were unremarkable.  EKG was unchanged.  Chest x-ray was negative.  Myocardial perfusion scan was normal.  Echo showed EF of 50 to 55%.  He was started on Atorvastatin, advised to continue his other meds.  Advised to follow-up with [facility] cardiology.  He does note that his blood pressure and blood sugars have been erratic lately. He is taking Lantus, Metformin and Glipizide. His last A1C 8.4 was. His BP today 146/98.  Review of Systems      Past Medical History:  Diagnosis Date  . Acute transmural inferior wall MI ([place]) 08/28/2012   .5 x 12 mm Veri-flex stent non-DES.  Marland Kitchen Chicken pox   . Coronary artery disease    Inferior ST elevation myocardial infarction in July of 2014. Cardiac catheterization showed 95% distal RCA stenosis and 90% first diagonal stenosis with normal ejection fraction. He underwent PCI in bare-metal stent placement to the distal RCA.  . Diabetes mellitus without complication ([place])   . Hypercholesterolemia   . Tobacco use     Current Outpatient Medications  Medication Sig Dispense Refill  . aspirin 81 MG EC tablet Take 1 tablet (81 mg total) by mouth daily. 30 tablet 4  . Blood Glucose Monitoring Suppl (BAYER CONTOUR NEXT MONITOR) w/Device KIT 1 Device 1 kit 0  . buPROPion (WELLBUTRIN XL) 150 MG 24 hr tablet Take 1 tablet (150 mg total) by mouth daily. 90 tablet 3  . CONTOUR NEXT TEST test strip USE 1 STRIP TO CHECK GLUCOSE THREE TIMES DAILY AS NEEDED 100 each 0  . cyclobenzaprine (FLEXERIL) 10 MG tablet Take 1 tablet (10 mg total) by mouth 3 (three) times daily as needed. 30 tablet 0  . glipiZIDE (GLUCOTROL) 10 MG tablet Take 1 tablet (10 mg total) by mouth 2 (two) times daily before a meal. 90 tablet 0  . insulin glargine (LANTUS SOLOSTAR) 100 UNIT/ML  Solostar Pen Inject 23 Units into the skin daily. 15 pen 1  . Insulin Pen Needle 31G X 8 MM MISC 1 each by Does not apply route daily. 90 each 1  . metFORMIN (GLUCOPHAGE) 1000 MG tablet TAKE 1 TABLET BY MOUTH TWICE DAILY WITH A MEAL 180 tablet 0  . naproxen (NAPROSYN) 500 MG tablet TAKE 1 TABLET(500 MG) BY MOUTH TWICE DAILY WITH A MEAL 14 tablet 0  . nitroGLYCERIN (NITROSTAT) 0.4 MG SL tablet DISSOLVE ONE TABLET UNDER THE TONGUE EVERY 5 MINUTES AS NEEDED FOR CHEST PAIN.  DO NOT EXCEED A TOTAL OF 3 DOSES IN 15 MINUTES 25 tablet 12  . Omega-3 Fatty Acids (FISH OIL) 1200 MG CAPS Take 1,200 mg by mouth 3 (three) times daily.    Marland Kitchen omeprazole (PRILOSEC) 20 MG capsule Take 1 capsule (20 mg total) by mouth daily. 30 capsule 3  . simvastatin (ZOCOR) 80 MG tablet Take 1 tablet (80 mg total) by mouth daily. 90 tablet 3   No current facility-administered medications for this visit.    No Known Allergies  Family History  Problem Relation Age of Onset  . Arthritis Mother        Rheumatoid  . Diabetes Mother   . Hyperlipidemia Mother   . Diabetes Father   . Cancer Maternal Grandfather  Prostate  . Parkinson's disease Maternal Grandfather     Social History   Socioeconomic History  . Marital status: Married    Spouse name: Not on file  . Number of children: Not on file  . Years of education: Not on file  . Highest education level: Not on file  Occupational History  . Not on file  Tobacco Use  . Smoking status: Current Every Day Smoker    Packs/day: 1.50    Years: 20.00    Pack years: 30.00    Types: Cigarettes  . Smokeless tobacco: Never Used  Substance and Sexual Activity  . Alcohol use: Yes    Alcohol/week: 0.0 standard drinks    Comment: occasional  . Drug use: Yes    Types: Marijuana  . Sexual activity: Yes    Birth control/protection: None  Other Topics Concern  . Not on file  Social History Narrative  . Not on file   Social Determinants of Health   Financial  Resource Strain:   . Difficulty of Paying Living Expenses:   Food Insecurity:   . Worried About Charity fundraiser in the Last Year:   . Arboriculturist in the Last Year:   Transportation Needs:   . Film/video editor (Medical):   Marland Kitchen Lack of Transportation (Non-Medical):   Physical Activity:   . Days of Exercise per Week:   . Minutes of Exercise per Session:   Stress:   . Feeling of Stress :   Social Connections:   . Frequency of Communication with Friends and Family:   . Frequency of Social Gatherings with Friends and Family:   . Attends Religious Services:   . Active Member of Clubs or Organizations:   . Attends Archivist Meetings:   Marland Kitchen Marital Status:   Intimate Partner Violence:   . Fear of Current or Ex-Partner:   . Emotionally Abused:   Marland Kitchen Physically Abused:   . Sexually Abused:      Constitutional: Pt reports headaches. Denies fever, malaise, fatigue, or abrupt weight changes.  HEENT: Denies eye pain, eye redness, ear pain, ringing in the ears, wax buildup, runny nose, nasal congestion, bloody nose, or sore throat. Respiratory: Denies difficulty breathing, shortness of breath, cough or sputum production.   Cardiovascular: Pt reports intermittent chest pain. Denies chest tightness, palpitations or swelling in the hands or feet.  Gastrointestinal: Pt reports intermittent nausea. Denies abdominal pain, bloating, constipation, diarrhea or blood in the stool.  Neurological: Denies dizziness, difficulty with memory, difficulty with speech or problems with balance and coordination.    No other specific complaints in a complete review of systems (except as listed in HPI above).  Objective:   Physical Exam BP (!) 146/98   Pulse 93   Temp 97.8 F (36.6 C) (Temporal)   Wt 245 lb (111.1 kg)   SpO2 99%   BMI 34.66 kg/m   Wt Readings from Last 3 Encounters:  05/09/19 248 lb (112.5 kg)  05/04/19 250 lb (113.4 kg)  03/05/19 252 lb (114.3 kg)    General:  Appears his stated age, obese. in NAD. Skin: Warm, dry and intact. Open wound noted to bottom of left foot. Cardiovascular: Normal rate and rhythm.  Pulmonary/Chest: Normal effort and positive vesicular breath sounds. No respiratory distress. No wheezes, rales or ronchi noted.  Neurological: Alert and oriented.  Psychiatric: Mildly anxious appearing     BMET    Component Value Date/Time   NA 141 09/26/2018 1547  NA 139 01/29/2013 0040   K 4.5 09/26/2018 1547   K 4.0 01/29/2013 0040   CL 101 09/26/2018 1547   CL 107 01/29/2013 0040   CO2 25 09/26/2018 1547   CO2 28 01/29/2013 0040   GLUCOSE 292 (H) 09/26/2018 1547   GLUCOSE 485 (H) 06/20/2017 1510   GLUCOSE 228 (H) 01/29/2013 0040   BUN 11 09/26/2018 1547   BUN 15 01/29/2013 0040   CREATININE 0.90 09/26/2018 1547   CREATININE 0.88 01/29/2013 0040   CALCIUM 9.6 09/26/2018 1547   CALCIUM 9.2 01/29/2013 0040   GFRNONAA 108 09/26/2018 1547   GFRNONAA >60 01/29/2013 0040   GFRAA 125 09/26/2018 1547   GFRAA >60 01/29/2013 0040    Lipid Panel     Component Value Date/Time   CHOL 163 09/26/2018 1547   TRIG 176 (H) 09/26/2018 1547   HDL 39 (L) 09/26/2018 1547   CHOLHDL 4.2 09/26/2018 1547   CHOLHDL 6 06/20/2017 1510   VLDL 50.2 (H) 06/20/2017 1510   LDLCALC 89 09/26/2018 1547    CBC    Component Value Date/Time   WBC 11.8 (H) 09/26/2018 1547   WBC 10.9 (H) 06/20/2017 1510   RBC 5.19 09/26/2018 1547   RBC 5.41 06/20/2017 1510   HGB 16.8 09/26/2018 1547   HCT 49.4 09/26/2018 1547   PLT 220 09/26/2018 1547   MCV 95 09/26/2018 1547   MCV 93 01/29/2013 0040   MCH 32.4 09/26/2018 1547   MCH 31.9 03/19/2017 0325   MCHC 34.0 09/26/2018 1547   MCHC 35.1 06/20/2017 1510   RDW 12.2 09/26/2018 1547   RDW 13.8 01/29/2013 0040   LYMPHSABS 3.4 03/18/2017 1823   MONOABS 1.3 (H) 03/18/2017 1823   EOSABS 0.2 03/18/2017 1823   BASOSABS 0.1 03/18/2017 1823    Hgb A1C Lab Results  Component Value Date   HGBA1C 7.5 (A)  03/05/2019            Assessment & Plan:   ER follow-up for Chest Pain, HTN with CAD s/p MI, HLD, DM 2:  ER notes, labs and imaging reviewed History of MI Discussed the importance of blood pressure, blood sugar and cholesterol control Continue Atorvastatin- consume a low fat diet Will start Losartan 50 mg daily- followup with me in 2weeks He is not on a beta blocker, will defer this to cards RX for Januvia 100 mg daily in addition to Lantus, Metformin and Glipizide Referral to cardiology placed- he does not want to drive all the way to [facility]  Open Wound Foot:  RX for Septra DS 1 tab BID x 7 days Keep area clean and dry  RTC in 2 weeks for follow up HTN  Return precautions discussed Webb Silversmith, NP This visit occurred during the SARS-CoV-2 public health emergency.  Safety protocols were in place, including screening questions prior to the visit, additional usage of staff PPE, and extensive cleaning of exam room while observing appropriate contact time as indicated for disinfecting solutions.

## 2019-06-11 NOTE — Patient Instructions (Signed)

## 2019-06-11 NOTE — Telephone Encounter (Signed)
PA has been submitted via [email or web address] for Januvia, awaiting response

## 2019-06-14 ENCOUNTER — Ambulatory Visit: Payer: BC Managed Care – PPO | Admitting: Cardiology

## 2019-06-17 ENCOUNTER — Ambulatory Visit: Payer: BC Managed Care – PPO | Admitting: Internal Medicine

## 2019-06-18 ENCOUNTER — Encounter: Payer: Self-pay | Admitting: Cardiology

## 2019-06-26 ENCOUNTER — Ambulatory Visit (INDEPENDENT_AMBULATORY_CARE_PROVIDER_SITE_OTHER): Payer: BC Managed Care – PPO | Admitting: Internal Medicine

## 2019-06-26 DIAGNOSIS — Z5329 Procedure and treatment not carried out because of patient's decision for other reasons: Secondary | ICD-10-CM

## 2019-06-26 NOTE — Progress Notes (Signed)
Pt no showed for appt..... 

## 2019-07-01 ENCOUNTER — Other Ambulatory Visit: Payer: Self-pay | Admitting: Internal Medicine

## 2019-07-01 DIAGNOSIS — IMO0002 Reserved for concepts with insufficient information to code with codable children: Secondary | ICD-10-CM

## 2019-07-01 DIAGNOSIS — E1140 Type 2 diabetes mellitus with diabetic neuropathy, unspecified: Secondary | ICD-10-CM

## 2019-07-04 ENCOUNTER — Telehealth: Payer: Self-pay | Admitting: Internal Medicine

## 2019-07-04 DIAGNOSIS — E1140 Type 2 diabetes mellitus with diabetic neuropathy, unspecified: Secondary | ICD-10-CM

## 2019-07-04 DIAGNOSIS — IMO0002 Reserved for concepts with insufficient information to code with codable children: Secondary | ICD-10-CM

## 2019-07-04 NOTE — Telephone Encounter (Signed)
Patient called He stated that [facility] is requesting a new script for the patient metformin since they have never filled the before for him,  Patient said he only has 4 days left of medications     [id number] - [place], [place] - [address]

## 2019-07-10 MED ORDER — METFORMIN HCL 1000 MG PO TABS: ORAL_TABLET | ORAL | 0 refills | Status: DC

## 2019-07-10 NOTE — Addendum Note (Signed)
Addended by: Roena Malady on: 07/10/2019 11:06 AM   Modules accepted: Orders

## 2019-07-18 DIAGNOSIS — I252 Old myocardial infarction: Secondary | ICD-10-CM | POA: Diagnosis not present

## 2019-07-18 DIAGNOSIS — N492 Inflammatory disorders of scrotum: Secondary | ICD-10-CM | POA: Diagnosis not present

## 2019-07-18 DIAGNOSIS — E785 Hyperlipidemia, unspecified: Secondary | ICD-10-CM | POA: Diagnosis not present

## 2019-07-18 DIAGNOSIS — I2510 Atherosclerotic heart disease of native coronary artery without angina pectoris: Secondary | ICD-10-CM | POA: Diagnosis not present

## 2019-07-18 DIAGNOSIS — I10 Essential (primary) hypertension: Secondary | ICD-10-CM | POA: Diagnosis not present

## 2019-07-18 DIAGNOSIS — Z79899 Other long term (current) drug therapy: Secondary | ICD-10-CM | POA: Diagnosis not present

## 2019-07-18 DIAGNOSIS — Z794 Long term (current) use of insulin: Secondary | ICD-10-CM | POA: Diagnosis not present

## 2019-07-18 DIAGNOSIS — R000 Tachycardia, unspecified: Secondary | ICD-10-CM | POA: Diagnosis not present

## 2019-07-18 DIAGNOSIS — D72829 Elevated white blood cell count, unspecified: Secondary | ICD-10-CM | POA: Diagnosis not present

## 2019-07-18 DIAGNOSIS — L02214 Cutaneous abscess of groin: Secondary | ICD-10-CM | POA: Diagnosis not present

## 2019-07-18 DIAGNOSIS — Z7982 Long term (current) use of aspirin: Secondary | ICD-10-CM | POA: Diagnosis not present

## 2019-07-18 DIAGNOSIS — N5082 Scrotal pain: Secondary | ICD-10-CM | POA: Diagnosis not present

## 2019-07-18 DIAGNOSIS — R110 Nausea: Secondary | ICD-10-CM | POA: Diagnosis not present

## 2019-07-18 DIAGNOSIS — F17210 Nicotine dependence, cigarettes, uncomplicated: Secondary | ICD-10-CM | POA: Diagnosis not present

## 2019-07-18 DIAGNOSIS — L02215 Cutaneous abscess of perineum: Secondary | ICD-10-CM | POA: Diagnosis not present

## 2019-07-18 DIAGNOSIS — E1165 Type 2 diabetes mellitus with hyperglycemia: Secondary | ICD-10-CM | POA: Diagnosis not present

## 2019-07-18 DIAGNOSIS — B9689 Other specified bacterial agents as the cause of diseases classified elsewhere: Secondary | ICD-10-CM | POA: Diagnosis not present

## 2019-07-19 DIAGNOSIS — L02215 Cutaneous abscess of perineum: Secondary | ICD-10-CM | POA: Diagnosis not present

## 2019-07-20 ENCOUNTER — Inpatient Hospital Stay: Payer: BC Managed Care – PPO | Admitting: Anesthesiology

## 2019-07-20 ENCOUNTER — Inpatient Hospital Stay
Admission: EM | Admit: 2019-07-20 | Discharge: 2019-07-26 | DRG: 853 | Disposition: A | Discharge status: Home / Self Care | Payer: BC Managed Care – PPO | Attending: Internal Medicine | Admitting: Internal Medicine

## 2019-07-20 ENCOUNTER — Emergency Department: Payer: BC Managed Care – PPO

## 2019-07-20 ENCOUNTER — Inpatient Hospital Stay: Admit: 2019-07-20 | Payer: BC Managed Care – PPO | Admitting: Urology

## 2019-07-20 ENCOUNTER — Encounter
Admission: EM | Disposition: A | Discharge status: Home / Self Care | Payer: Self-pay | Source: Home / Self Care | Attending: Internal Medicine

## 2019-07-20 ENCOUNTER — Other Ambulatory Visit: Payer: Self-pay

## 2019-07-20 DIAGNOSIS — Z8261 Family history of arthritis: Secondary | ICD-10-CM

## 2019-07-20 DIAGNOSIS — N179 Acute kidney failure, unspecified: Secondary | ICD-10-CM | POA: Diagnosis not present

## 2019-07-20 DIAGNOSIS — Z809 Family history of malignant neoplasm, unspecified: Secondary | ICD-10-CM | POA: Diagnosis not present

## 2019-07-20 DIAGNOSIS — Z794 Long term (current) use of insulin: Secondary | ICD-10-CM

## 2019-07-20 DIAGNOSIS — E871 Hypo-osmolality and hyponatremia: Secondary | ICD-10-CM | POA: Diagnosis not present

## 2019-07-20 DIAGNOSIS — S3130XA Unspecified open wound of scrotum and testes, initial encounter: Secondary | ICD-10-CM | POA: Diagnosis not present

## 2019-07-20 DIAGNOSIS — E1169 Type 2 diabetes mellitus with other specified complication: Secondary | ICD-10-CM | POA: Diagnosis present

## 2019-07-20 DIAGNOSIS — N492 Inflammatory disorders of scrotum: Secondary | ICD-10-CM | POA: Diagnosis not present

## 2019-07-20 DIAGNOSIS — E785 Hyperlipidemia, unspecified: Secondary | ICD-10-CM | POA: Diagnosis not present

## 2019-07-20 DIAGNOSIS — A419 Sepsis, unspecified organism: Secondary | ICD-10-CM | POA: Diagnosis not present

## 2019-07-20 DIAGNOSIS — I252 Old myocardial infarction: Secondary | ICD-10-CM | POA: Diagnosis not present

## 2019-07-20 DIAGNOSIS — Z720 Tobacco use: Secondary | ICD-10-CM | POA: Diagnosis present

## 2019-07-20 DIAGNOSIS — E1140 Type 2 diabetes mellitus with diabetic neuropathy, unspecified: Secondary | ICD-10-CM | POA: Diagnosis not present

## 2019-07-20 DIAGNOSIS — K219 Gastro-esophageal reflux disease without esophagitis: Secondary | ICD-10-CM | POA: Diagnosis not present

## 2019-07-20 DIAGNOSIS — Z820 Family history of epilepsy and other diseases of the nervous system: Secondary | ICD-10-CM | POA: Diagnosis not present

## 2019-07-20 DIAGNOSIS — N1831 Chronic kidney disease, stage 3a: Secondary | ICD-10-CM | POA: Diagnosis present

## 2019-07-20 DIAGNOSIS — Z833 Family history of diabetes mellitus: Secondary | ICD-10-CM | POA: Diagnosis not present

## 2019-07-20 DIAGNOSIS — F17210 Nicotine dependence, cigarettes, uncomplicated: Secondary | ICD-10-CM | POA: Diagnosis not present

## 2019-07-20 DIAGNOSIS — E1122 Type 2 diabetes mellitus with diabetic chronic kidney disease: Secondary | ICD-10-CM | POA: Diagnosis not present

## 2019-07-20 DIAGNOSIS — I2510 Atherosclerotic heart disease of native coronary artery without angina pectoris: Secondary | ICD-10-CM | POA: Diagnosis not present

## 2019-07-20 DIAGNOSIS — E119 Type 2 diabetes mellitus without complications: Secondary | ICD-10-CM | POA: Diagnosis not present

## 2019-07-20 DIAGNOSIS — Z83438 Family history of other disorder of lipoprotein metabolism and other lipidemia: Secondary | ICD-10-CM

## 2019-07-20 DIAGNOSIS — N493 Fournier gangrene: Secondary | ICD-10-CM | POA: Diagnosis not present

## 2019-07-20 DIAGNOSIS — N170 Acute kidney failure with tubular necrosis: Secondary | ICD-10-CM | POA: Diagnosis present

## 2019-07-20 DIAGNOSIS — Z79899 Other long term (current) drug therapy: Secondary | ICD-10-CM | POA: Diagnosis not present

## 2019-07-20 DIAGNOSIS — E1165 Type 2 diabetes mellitus with hyperglycemia: Secondary | ICD-10-CM | POA: Diagnosis not present

## 2019-07-20 DIAGNOSIS — I10 Essential (primary) hypertension: Secondary | ICD-10-CM | POA: Diagnosis present

## 2019-07-20 DIAGNOSIS — N5089 Other specified disorders of the male genital organs: Secondary | ICD-10-CM | POA: Diagnosis not present

## 2019-07-20 DIAGNOSIS — Z20822 Contact with and (suspected) exposure to covid-19: Secondary | ICD-10-CM | POA: Diagnosis present

## 2019-07-20 DIAGNOSIS — I129 Hypertensive chronic kidney disease with stage 1 through stage 4 chronic kidney disease, or unspecified chronic kidney disease: Secondary | ICD-10-CM | POA: Diagnosis present

## 2019-07-20 DIAGNOSIS — Z955 Presence of coronary angioplasty implant and graft: Secondary | ICD-10-CM

## 2019-07-20 DIAGNOSIS — Z7982 Long term (current) use of aspirin: Secondary | ICD-10-CM | POA: Diagnosis not present

## 2019-07-20 HISTORY — PX: INCISION AND DRAINAGE ABSCESS: SHX5864

## 2019-07-20 LAB — COMPREHENSIVE METABOLIC PANEL
ALT: 21 U/L (ref 0–44)
AST: 16 U/L (ref 15–41)
Albumin: 3.4 g/dL — ABNORMAL LOW (ref 3.5–5.0)
Alkaline Phosphatase: 91 U/L (ref 38–126)
Anion gap: 13 (ref 5–15)
BUN: 11 mg/dL (ref 6–20)
CO2: 20 mmol/L — ABNORMAL LOW (ref 22–32)
Calcium: 8.3 mg/dL — ABNORMAL LOW (ref 8.9–10.3)
Chloride: 94 mmol/L — ABNORMAL LOW (ref 98–111)
Creatinine, Ser: 1.1 mg/dL (ref 0.61–1.24)
GFR calc Af Amer: >60 mL/min (ref >60)
GFR calc non Af Amer: >60 mL/min (ref >60)
Glucose, Bld: 307 mg/dL — ABNORMAL HIGH (ref 70–99)
Potassium: 3.8 mmol/L (ref 3.5–5.1)
Sodium: 127 mmol/L — ABNORMAL LOW (ref 135–145)
Total Bilirubin: 1.6 mg/dL — ABNORMAL HIGH (ref 0.3–1.2)
Total Protein: 7 g/dL (ref 6.5–8.1)

## 2019-07-20 LAB — GLUCOSE, CAPILLARY
Glucose-Capillary: 231 mg/dL — ABNORMAL HIGH (ref 70–99)
Glucose-Capillary: 246 mg/dL — ABNORMAL HIGH (ref 70–99)
Glucose-Capillary: 275 mg/dL — ABNORMAL HIGH (ref 70–99)
Glucose-Capillary: 282 mg/dL — ABNORMAL HIGH (ref 70–99)
Glucose-Capillary: 289 mg/dL — ABNORMAL HIGH (ref 70–99)

## 2019-07-20 LAB — CBC WITH DIFFERENTIAL/PLATELET
Abs Immature Granulocytes: 0.46 10*3/uL — ABNORMAL HIGH (ref 0.00–0.07)
Basophils Absolute: 0.1 10*3/uL (ref 0.0–0.1)
Basophils Relative: 0 %
Eosinophils Absolute: 0 10*3/uL (ref 0.0–0.5)
Eosinophils Relative: 0 %
HCT: 41.4 % (ref 39.0–52.0)
Hemoglobin: 15 g/dL (ref 13.0–17.0)
Immature Granulocytes: 2 %
Lymphocytes Relative: 4 %
Lymphs Abs: 1.3 10*3/uL (ref 0.7–4.0)
MCH: 33 pg (ref 26.0–34.0)
MCHC: 36.2 g/dL — ABNORMAL HIGH (ref 30.0–36.0)
MCV: 91 fL (ref 80.0–100.0)
Monocytes Absolute: 2.2 10*3/uL — ABNORMAL HIGH (ref 0.1–1.0)
Monocytes Relative: 7 %
Neutro Abs: 26.8 10*3/uL — ABNORMAL HIGH (ref 1.7–7.7)
Neutrophils Relative %: 87 %
Platelets: 193 10*3/uL (ref 150–400)
RBC: 4.55 MIL/uL (ref 4.22–5.81)
RDW: 12.4 % (ref 11.5–15.5)
Smear Review: NORMAL
WBC: 30.9 10*3/uL — ABNORMAL HIGH (ref 4.0–10.5)
nRBC: 0 % (ref 0.0–0.2)

## 2019-07-20 LAB — URINALYSIS, COMPLETE (UACMP) WITH MICROSCOPIC
Bacteria, UA: NONE SEEN
Bilirubin Urine: NEGATIVE
Glucose, UA: >=500 mg/dL — AB
Ketones, ur: 20 mg/dL — AB
Leukocytes,Ua: NEGATIVE
Nitrite: NEGATIVE
Protein, ur: NEGATIVE mg/dL
Specific Gravity, Urine: 1.017 (ref 1.005–1.030)
Squamous Epithelial / HPF: NONE SEEN (ref 0–5)
pH: 6 (ref 5.0–8.0)

## 2019-07-20 LAB — URINE DRUG SCREEN, QUALITATIVE (ARMC ONLY)
Amphetamines, Ur Screen: NOT DETECTED
Barbiturates, Ur Screen: NOT DETECTED
Benzodiazepine, Ur Scrn: NOT DETECTED
Cannabinoid 50 Ng, Ur ~~LOC~~: POSITIVE — AB
Cocaine Metabolite,Ur ~~LOC~~: NOT DETECTED
MDMA (Ecstasy)Ur Screen: NOT DETECTED
Methadone Scn, Ur: NOT DETECTED
Opiate, Ur Screen: NOT DETECTED
Phencyclidine (PCP) Ur S: NOT DETECTED
Tricyclic, Ur Screen: NOT DETECTED

## 2019-07-20 LAB — SARS CORONAVIRUS 2 BY RT PCR (HOSPITAL ORDER, PERFORMED IN ~~LOC~~ HOSPITAL LAB): SARS Coronavirus 2: NEGATIVE

## 2019-07-20 LAB — PROCALCITONIN: Procalcitonin: 2.64 ng/mL

## 2019-07-20 LAB — LACTIC ACID, PLASMA: Lactic Acid, Venous: 1.5 mmol/L (ref 0.5–1.9)

## 2019-07-20 SURGERY — INCISION AND DRAINAGE, ABSCESS
Anesthesia: General | Site: Scrotum

## 2019-07-20 MED ORDER — OXYCODONE-ACETAMINOPHEN 5-325 MG PO TABS
1.0000 | ORAL_TABLET | ORAL | Status: DC | PRN
  Administered 2019-07-21 – 2019-07-25 (×6): 1 via ORAL
  Filled 2019-07-20 (×6): qty 1

## 2019-07-20 MED ORDER — INSULIN ASPART 100 UNIT/ML ~~LOC~~ SOLN
5.0000 [IU] | Freq: Once | SUBCUTANEOUS | Status: AC
  Administered 2019-07-20: 5 [IU] via SUBCUTANEOUS

## 2019-07-20 MED ORDER — LIDOCAINE HCL (CARDIAC) PF 100 MG/5ML IV SOSY
PREFILLED_SYRINGE | INTRAVENOUS | Status: DC | PRN
  Administered 2019-07-20: 100 mg via INTRAVENOUS

## 2019-07-20 MED ORDER — FENTANYL CITRATE (PF) 100 MCG/2ML IJ SOLN
INTRAMUSCULAR | Status: AC
  Filled 2019-07-20: qty 2

## 2019-07-20 MED ORDER — PIPERACILLIN-TAZOBACTAM 4.5 G IVPB: 4.5000 g | Freq: Once | INTRAVENOUS | Status: DC

## 2019-07-20 MED ORDER — SODIUM CHLORIDE 0.9 % IV BOLUS: 2000.0000 mL | Freq: Once | INTRAVENOUS | Status: DC

## 2019-07-20 MED ORDER — INSULIN ASPART 100 UNIT/ML ~~LOC~~ SOLN
SUBCUTANEOUS | Status: AC
  Filled 2019-07-20: qty 1

## 2019-07-20 MED ORDER — SODIUM CHLORIDE 0.9 % IV BOLUS
1000.0000 mL | Freq: Once | INTRAVENOUS | Status: AC
  Administered 2019-07-20: 1000 mL via INTRAVENOUS

## 2019-07-20 MED ORDER — INSULIN ASPART 100 UNIT/ML ~~LOC~~ SOLN
0.0000 [IU] | SUBCUTANEOUS | Status: DC
  Administered 2019-07-20: 5 [IU] via SUBCUTANEOUS
  Administered 2019-07-21: 7 [IU] via SUBCUTANEOUS
  Administered 2019-07-21: 3 [IU] via SUBCUTANEOUS
  Administered 2019-07-21: 9 [IU] via SUBCUTANEOUS
  Administered 2019-07-21: 5 [IU] via SUBCUTANEOUS
  Filled 2019-07-20 (×4): qty 1

## 2019-07-20 MED ORDER — DIPHENHYDRAMINE HCL 50 MG/ML IJ SOLN
INTRAMUSCULAR | Status: AC
  Filled 2019-07-20: qty 1

## 2019-07-20 MED ORDER — VANCOMYCIN HCL 1000 MG IV SOLR
INTRAVENOUS | Status: AC
  Filled 2019-07-20: qty 1000

## 2019-07-20 MED ORDER — PROPOFOL 10 MG/ML IV BOLUS
INTRAVENOUS | Status: DC | PRN
  Administered 2019-07-20: 150 mg via INTRAVENOUS

## 2019-07-20 MED ORDER — OXYCODONE HCL 5 MG PO TABS: 5.0000 mg | ORAL_TABLET | Freq: Once | ORAL | Status: DC | PRN

## 2019-07-20 MED ORDER — VANCOMYCIN HCL 750 MG/150ML IV SOLN
750.0000 mg | Freq: Three times a day (TID) | INTRAVENOUS | Status: DC
  Administered 2019-07-21 (×2): 750 mg via INTRAVENOUS
  Filled 2019-07-20 (×4): qty 150

## 2019-07-20 MED ORDER — VANCOMYCIN HCL 500 MG/100ML IV SOLN
500.0000 mg | Freq: Once | INTRAVENOUS | Status: DC
  Filled 2019-07-20: qty 100

## 2019-07-20 MED ORDER — ONDANSETRON HCL 4 MG/2ML IJ SOLN: 4.0000 mg | Freq: Once | INTRAMUSCULAR | Status: DC | PRN

## 2019-07-20 MED ORDER — PROPOFOL 10 MG/ML IV BOLUS
INTRAVENOUS | Status: AC
  Filled 2019-07-20: qty 20

## 2019-07-20 MED ORDER — ASPIRIN EC 81 MG PO TBEC
81.0000 mg | DELAYED_RELEASE_TABLET | Freq: Every day | ORAL | Status: DC
  Administered 2019-07-20 – 2019-07-26 (×6): 81 mg via ORAL
  Filled 2019-07-20 (×6): qty 1

## 2019-07-20 MED ORDER — FENTANYL CITRATE (PF) 100 MCG/2ML IJ SOLN
25.0000 ug | INTRAMUSCULAR | Status: DC | PRN
  Administered 2019-07-20 (×4): 25 ug via INTRAVENOUS

## 2019-07-20 MED ORDER — ONDANSETRON HCL 4 MG/2ML IJ SOLN: 4.0000 mg | Freq: Three times a day (TID) | INTRAMUSCULAR | Status: DC | PRN

## 2019-07-20 MED ORDER — IOHEXOL 300 MG/ML  SOLN
100.0000 mL | Freq: Once | INTRAMUSCULAR | Status: AC | PRN
  Administered 2019-07-20: 100 mL via INTRAVENOUS

## 2019-07-20 MED ORDER — PIPERACILLIN-TAZOBACTAM 3.375 G IVPB 30 MIN
3.3750 g | Freq: Once | INTRAVENOUS | Status: AC
  Administered 2019-07-20: 3.375 g via INTRAVENOUS

## 2019-07-20 MED ORDER — LIDOCAINE HCL (PF) 2 % IJ SOLN
INTRAMUSCULAR | Status: AC
  Filled 2019-07-20: qty 5

## 2019-07-20 MED ORDER — ACETAMINOPHEN 500 MG PO TABS
1000.0000 mg | ORAL_TABLET | Freq: Once | ORAL | Status: AC
  Administered 2019-07-20: 1000 mg via ORAL
  Filled 2019-07-20: qty 2

## 2019-07-20 MED ORDER — VANCOMYCIN HCL IN DEXTROSE 1-5 GM/200ML-% IV SOLN: 1000.0000 mg | Freq: Once | INTRAVENOUS | Status: DC

## 2019-07-20 MED ORDER — SUCCINYLCHOLINE CHLORIDE 20 MG/ML IJ SOLN
INTRAMUSCULAR | Status: DC | PRN
  Administered 2019-07-20: 120 mg via INTRAVENOUS

## 2019-07-20 MED ORDER — VANCOMYCIN HCL 10 G IV SOLR
2500.0000 mg | Freq: Once | INTRAVENOUS | Status: DC
  Administered 2019-07-20: 2500 mg via INTRAVENOUS
  Filled 2019-07-20: qty 2500

## 2019-07-20 MED ORDER — CLINDAMYCIN PHOSPHATE 600 MG/50ML IV SOLN: 600.0000 mg | Freq: Three times a day (TID) | INTRAVENOUS | Status: DC

## 2019-07-20 MED ORDER — DIPHENHYDRAMINE HCL 50 MG/ML IJ SOLN
INTRAMUSCULAR | Status: DC | PRN
  Administered 2019-07-20: 12.5 mg via INTRAVENOUS

## 2019-07-20 MED ORDER — NICOTINE 21 MG/24HR TD PT24
21.0000 mg | MEDICATED_PATCH | Freq: Every day | TRANSDERMAL | Status: DC
  Filled 2019-07-20 (×4): qty 1

## 2019-07-20 MED ORDER — VANCOMYCIN HCL 2000 MG/400ML IV SOLN
2000.0000 mg | Freq: Once | INTRAVENOUS | Status: DC
  Filled 2019-07-20: qty 400

## 2019-07-20 MED ORDER — ACETAMINOPHEN 325 MG PO TABS
650.0000 mg | ORAL_TABLET | Freq: Four times a day (QID) | ORAL | Status: DC | PRN
  Administered 2019-07-21: 650 mg via ORAL
  Filled 2019-07-20: qty 2

## 2019-07-20 MED ORDER — NITROGLYCERIN 0.4 MG SL SUBL: 0.4000 mg | SUBLINGUAL_TABLET | SUBLINGUAL | Status: DC | PRN

## 2019-07-20 MED ORDER — VANCOMYCIN HCL 1000 MG IV SOLR
INTRAVENOUS | Status: AC
  Filled 2019-07-20: qty 2000

## 2019-07-20 MED ORDER — PANTOPRAZOLE SODIUM 40 MG PO TBEC
40.0000 mg | DELAYED_RELEASE_TABLET | Freq: Every day | ORAL | Status: DC
  Administered 2019-07-20 – 2019-07-26 (×6): 40 mg via ORAL
  Filled 2019-07-20 (×6): qty 1

## 2019-07-20 MED ORDER — VANCOMYCIN HCL 10 G IV SOLR: 1.0000 g | Freq: Once | INTRAVENOUS | Status: DC

## 2019-07-20 MED ORDER — INSULIN GLARGINE 100 UNIT/ML ~~LOC~~ SOLN
16.0000 [IU] | Freq: Every day | SUBCUTANEOUS | Status: DC
  Administered 2019-07-20: 16 [IU] via SUBCUTANEOUS
  Filled 2019-07-20 (×2): qty 0.16

## 2019-07-20 MED ORDER — LACTATED RINGERS IV SOLN: INTRAVENOUS | Status: DC | PRN

## 2019-07-20 MED ORDER — PIPERACILLIN-TAZOBACTAM 3.375 G IVPB
3.3750 g | Freq: Three times a day (TID) | INTRAVENOUS | Status: DC
  Administered 2019-07-20 – 2019-07-26 (×17): 3.375 g via INTRAVENOUS
  Filled 2019-07-20 (×16): qty 50

## 2019-07-20 MED ORDER — SUCCINYLCHOLINE CHLORIDE 200 MG/10ML IV SOSY
PREFILLED_SYRINGE | INTRAVENOUS | Status: AC
  Filled 2019-07-20: qty 10

## 2019-07-20 MED ORDER — DEXMEDETOMIDINE HCL 200 MCG/2ML IV SOLN
INTRAVENOUS | Status: DC | PRN
  Administered 2019-07-20: 8 ug via INTRAVENOUS
  Administered 2019-07-20: 4 ug via INTRAVENOUS

## 2019-07-20 MED ORDER — SODIUM CHLORIDE 0.9 % IV SOLN: INTRAVENOUS | Status: DC

## 2019-07-20 MED ORDER — MORPHINE SULFATE (PF) 2 MG/ML IV SOLN
2.0000 mg | INTRAVENOUS | Status: DC | PRN
  Administered 2019-07-22 – 2019-07-26 (×8): 2 mg via INTRAVENOUS
  Filled 2019-07-20 (×8): qty 1

## 2019-07-20 MED ORDER — MIDAZOLAM HCL 2 MG/2ML IJ SOLN
INTRAMUSCULAR | Status: DC | PRN
  Administered 2019-07-20: 2 mg via INTRAVENOUS

## 2019-07-20 MED ORDER — ATORVASTATIN CALCIUM 20 MG PO TABS
40.0000 mg | ORAL_TABLET | Freq: Every day | ORAL | Status: DC
  Administered 2019-07-20 – 2019-07-26 (×6): 40 mg via ORAL
  Filled 2019-07-20 (×6): qty 2

## 2019-07-20 MED ORDER — MIDAZOLAM HCL 2 MG/2ML IJ SOLN
INTRAMUSCULAR | Status: AC
  Filled 2019-07-20: qty 2

## 2019-07-20 MED ORDER — OXYCODONE HCL 5 MG/5ML PO SOLN: 5.0000 mg | Freq: Once | ORAL | Status: DC | PRN

## 2019-07-20 MED ORDER — PIPERACILLIN-TAZOBACTAM 3.375 G IVPB
INTRAVENOUS | Status: AC
  Filled 2019-07-20: qty 50

## 2019-07-20 MED ORDER — FENTANYL CITRATE (PF) 100 MCG/2ML IJ SOLN
INTRAMUSCULAR | Status: DC | PRN
  Administered 2019-07-20 (×3): 50 ug via INTRAVENOUS

## 2019-07-20 MED ORDER — ONDANSETRON HCL 4 MG/2ML IJ SOLN
INTRAMUSCULAR | Status: DC | PRN
  Administered 2019-07-20: 4 mg via INTRAVENOUS

## 2019-07-20 MED ORDER — HYDRALAZINE HCL 20 MG/ML IJ SOLN
5.0000 mg | INTRAMUSCULAR | Status: DC | PRN
  Filled 2019-07-20: qty 1

## 2019-07-20 MED ORDER — ONDANSETRON HCL 4 MG/2ML IJ SOLN
INTRAMUSCULAR | Status: AC
  Filled 2019-07-20: qty 2

## 2019-07-20 SURGICAL SUPPLY — 44 items
ADH SKN CLS APL DERMABOND .7 (GAUZE/BANDAGES/DRESSINGS) ×1
APL PRP STRL LF DISP 70% ISPRP (MISCELLANEOUS) ×1
BNDG CONFORM 2 STRL LF (GAUZE/BANDAGES/DRESSINGS) ×2 IMPLANT
BNDG GAUZE 4.5X4.1 6PLY STRL (MISCELLANEOUS) ×2 IMPLANT
CANISTER SUCT 1200ML W/VALVE (MISCELLANEOUS) ×2 IMPLANT
CHLORAPREP W/TINT 26 (MISCELLANEOUS) ×2 IMPLANT
COVER WAND RF STERILE (DRAPES) ×2 IMPLANT
DERMABOND ADVANCED (GAUZE/BANDAGES/DRESSINGS) ×1
DERMABOND ADVANCED .7 DNX12 (GAUZE/BANDAGES/DRESSINGS) ×1 IMPLANT
DRAIN PENROSE 1/4X12 LTX STRL (WOUND CARE) ×1 IMPLANT
DRAPE LAPAROTOMY 77X122 PED (DRAPES) ×2 IMPLANT
DRSG GAUZE FLUFF 36X18 (GAUZE/BANDAGES/DRESSINGS) ×2 IMPLANT
ELECT REM PT RETURN 9FT ADLT (ELECTROSURGICAL) ×2
ELECTRODE REM PT RTRN 9FT ADLT (ELECTROSURGICAL) ×1 IMPLANT
GAUZE SPONGE 4X4 12PLY STRL (GAUZE/BANDAGES/DRESSINGS) ×2 IMPLANT
GLOVE BIO SURGEON STRL SZ8 (GLOVE) ×1 IMPLANT
GLOVE BIOGEL PI IND STRL 7.5 (GLOVE) ×1 IMPLANT
GLOVE BIOGEL PI INDICATOR 7.5 (GLOVE) ×1
GOWN STRL REUS W/ TWL LRG LVL3 (GOWN DISPOSABLE) ×2 IMPLANT
GOWN STRL REUS W/ TWL XL LVL3 (GOWN DISPOSABLE) ×1 IMPLANT
GOWN STRL REUS W/TWL LRG LVL3 (GOWN DISPOSABLE) ×4
GOWN STRL REUS W/TWL XL LVL3 (GOWN DISPOSABLE) ×2
KIT TURNOVER KIT A (KITS) ×2 IMPLANT
LABEL OR SOLS (LABEL) ×2 IMPLANT
NDL HYPO 25X1 1.5 SAFETY (NEEDLE) ×1 IMPLANT
NEEDLE HYPO 25X1 1.5 SAFETY (NEEDLE) ×2 IMPLANT
NS IRRIG 500ML POUR BTL (IV SOLUTION) ×2 IMPLANT
PACK BASIN MINOR (MISCELLANEOUS) ×2 IMPLANT
PAD ABD DERMACEA PRESS 5X9 (GAUZE/BANDAGES/DRESSINGS) ×2 IMPLANT
SOL PREP PVP 2OZ (MISCELLANEOUS) ×2
SOLUTION PREP PVP 2OZ (MISCELLANEOUS) ×1 IMPLANT
SPONGE LAP 18X18 RF (DISPOSABLE) ×1 IMPLANT
SUPPORETR ATHLETIC LG (MISCELLANEOUS) ×1 IMPLANT
SUPPORTER ATHLETIC LG (MISCELLANEOUS) ×2
SUT ETHILON 3-0 FS-10 30 BLK (SUTURE)
SUT VIC AB 3-0 SH 27 (SUTURE)
SUT VIC AB 3-0 SH 27X BRD (SUTURE) ×2 IMPLANT
SUT VIC AB 4-0 SH 27 (SUTURE)
SUT VIC AB 4-0 SH 27XANBCTRL (SUTURE) ×1 IMPLANT
SUTURE EHLN 3-0 FS-10 30 BLK (SUTURE) ×1 IMPLANT
SWAB CULTURE AMIES ANAERIB BLU (MISCELLANEOUS) ×1 IMPLANT
SYR 10ML LL (SYRINGE) ×2 IMPLANT
SYR BULB IRRIG 60ML STRL (SYRINGE) ×1 IMPLANT
TRAY FOLEY MTR SLVR 16FR STAT (SET/KITS/TRAYS/PACK) ×1 IMPLANT

## 2019-07-20 NOTE — Progress Notes (Signed)
Pharmacy Antibiotic Note  Martin Mullen is a 39 y.o. 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  Pharmacy has been consulted for Vanc, Zosyn dosing.  Plan: Zosyn 3.375g IV q8h (4 hour infusion).   Vancomycin 2500 mg IV X 1 given on 6/5 @ 1610. Vancomycin 750 mg IV Q8H ordered to start on 6/6 @ 0000.  Vd = 56.7L Ke = 0.082 hr-1 T1/2 = 8.5 hrs AUC = 485.9 Cmin = 14.3   Height: 5\' 10"  (177.8 cm) Weight: 113.4 kg (250 lb) IBW/kg (Calculated) : 73  Temp (24hrs), Avg:99.4 F (37.4 C), Min:97.6 F (36.4 C), Max:100.9 F (38.3 C)  Recent Labs  Lab 07/20/19 1005 07/20/19 1059  WBC  --  30.9*  CREATININE 1.10  --   LATICACIDVEN 1.5  --     Estimated Creatinine Clearance: 113.8 mL/min (by C-G formula based on SCr of 1.1 mg/dL).    No Known Allergies  Antimicrobials this admission:   >>    >>   Dose adjustments this admission:   Microbiology results:  BCx:   UCx:    Sputum:    MRSA PCR:   Thank you for allowing pharmacy to be a part of this patient's care.  Zebulon Gantt D 07/20/2019 6:00 PM

## 2019-07-20 NOTE — Anesthesia Procedure Notes (Signed)
Procedure Name: Intubation Date/Time: 07/20/2019 3:48 PM Performed by: Estanislado Emms, CRNA Pre-anesthesia Checklist: Patient identified, Patient being monitored, Timeout performed, Emergency Drugs available and Suction available Patient Re-evaluated:Patient Re-evaluated prior to induction Oxygen Delivery Method: Circle system utilized Preoxygenation: Pre-oxygenation with 100% oxygen Induction Type: IV induction Laryngoscope Size: Miller and 2 Grade View: Grade II Tube type: Oral Tube size: 7.5 mm Number of attempts: 1 Airway Equipment and Method: Stylet Placement Confirmation: ETT inserted through vocal cords under direct vision,  positive ETCO2 and breath sounds checked- equal and bilateral Secured at: 21 cm Tube secured with: Tape Dental Injury: Teeth and Oropharynx as per pre-operative assessment

## 2019-07-20 NOTE — Plan of Care (Signed)
Continuing with plan of care. 

## 2019-07-20 NOTE — ED Notes (Signed)
IV attempted x2 no success.

## 2019-07-20 NOTE — Op Note (Signed)
Operative Note  Preoperative diagnosis:  1.  Gas-forming scrotal abscess versus Fournier's gangrene  Postoperative diagnosis: 1.  Fournier's gangrene  Procedure(s): 1.  Scrotal exploration with scrotal debridement, approximately 5 cm x 5 cm  Surgeon: Modena Slater, MD  Assistants: None  Anesthesia: General  Complications: None immediate  EBL: Minimal  Specimens: 1.  Wound culture 2.  Necrotic tissue  Drains/Catheters: 1.  Foley catheter  Intraoperative findings: No significant purulence.  However, there was significant amount of underlying necrotic tissue in the gas cavity.  This extended down the peritoneum.  Digital rectal exam revealed no evidence of rectal involvement.  Necrotic tissue extended towards the right and right testicle was viable with some overlying necrotic tissue that was excised.  Tissue on the left was viable prior to entering into the left hemiscrotum therefore the left testicle was not explored.  Tissue was excised until there was healthy appearing bleeding tissue.  Indication: 39 year old male originally presented to [facility] with a scrotal abscess.  He had a bedside incision and drainage.  After this, he developed increasing pain and swelling.  He presented to the emergency department here with leukocytosis of 30 and hyponatremia.  CT scan was performed that revealed evidence of gas within the hemiscrotum.  He was therefore taken urgently to the operating room for scrotal exploration, possible incision and drainage, possible wound debridement.  Description of procedure:  The patient was identified and consent was obtained.  The patient was taken to the operating room and placed in the supine position.  The patient was placed under general anesthesia.  Perioperative antibiotics were administered.  The patient was placed in dorsal lithotomy.  Patient was prepped and draped in a standard sterile fashion and a timeout was performed.  An approximately 5 cm longitudinal  incision was made with Bovie electrocautery in the inferior scrotum close to the median raphae.  Underlying tissue was noted to be necrotic.  I therefore excised all the necrotic tissue until there were healthy bleeding skin edges.  Necrotic tissue extended into the right hemiscrotum and the right testicle was delivered.  Overlying necrotic tissue was excised and the testicle itself appeared viable.  The necrotic skin extended posteriorly into the perineum.  I performed a digital rectal exam and confirmed that there was no involvement with the rectum.  A significant amount of perineal skin did have to be removed however.  Once all necrotic skin was excised and there were healthy bleeding wound edges, I fulgurated areas of bleeding with spot electrocautery to obtain hemostasis.  Once hemostasis was obtained, I copiously irrigated the area with normal saline.  I then wrapped the testicle and saline moistened Kerlix and also packed the wound cavity with the saline moistened Kerlix.  ABD and mesh underwear was applied.  This concluded the operation.  Patient tolerated the procedure well was stable postoperatively.  Plan: Daily wound care for now.  Depending on how he does, may need a second look in the operating room on Monday or Tuesday.

## 2019-07-20 NOTE — H&P (View-Only) (Signed)
H&P Physician requesting consult: Arta Silence, MD  Chief Complaint: Concern for Fournier's gangrene  History of Present Illness: 39 year old male with a history of poorly controlled diabetes.  He has had scrotal infections and required incision and drainage in the past.  Most recently, he developed some scrotal pain and swelling and went to [facility].  The emergency department performed a bedside I&D with minimal purulent output.  He was discharged with plans for follow-up.  However, he has had progressive scrotal and perineal pain as well swelling.  He presented here with leukocytosis of 30 and hyponatremia.  He is febrile with temperature of 100.9.  He has normal blood pressure but has some tachycardia.  He last ate last night.  He drank a little bit of water couple of hours ago.  CT scan was performed that revealed air within the scrotum.  He has uncontrolled diabetes.  Urinalysis was negative.  Pharmacy is working on obtaining his home medications.  Past Medical History:  Diagnosis Date  . Acute transmural inferior wall MI ([place]) 08/28/2012   .5 x 12 mm Veri-flex stent non-DES.  Marland Kitchen Chicken pox   . Coronary artery disease    Inferior ST elevation myocardial infarction in July of 2014. Cardiac catheterization showed 95% distal RCA stenosis and 90% first diagonal stenosis with normal ejection fraction. He underwent PCI in bare-metal stent placement to the distal RCA.  . Diabetes mellitus without complication ([place])   . Hypercholesterolemia   . Tobacco use    Past Surgical History:  Procedure Laterality Date  . CORONARY ANGIOPLASTY WITH STENT PLACEMENT    . LEFT HEART CATHETERIZATION WITH CORONARY ANGIOGRAM N/A 08/28/2012   Procedure: LEFT HEART CATHETERIZATION WITH CORONARY ANGIOGRAM;  Surgeon: Laverda Page, MD;  Location: [facility] CATH LAB;  Service: Cardiovascular;  Laterality: N/A;    Home Medications:  (Not in a hospital admission)  Allergies: No Known Allergies  Family History   Problem Relation Age of Onset  . Arthritis Mother        Rheumatoid  . Diabetes Mother   . Hyperlipidemia Mother   . Diabetes Father   . Cancer Maternal Grandfather        Prostate  . Parkinson's disease Maternal Grandfather    Social History:  reports that he has been smoking cigarettes. He has a 30.00 pack-year smoking history. He has never used smokeless tobacco. He reports current alcohol use. He reports current drug use. Drug: Marijuana.  ROS: A complete review of systems was performed.  All systems are negative except for pertinent findings as noted. ROS   Physical Exam:  Vital signs in last 24 hours: Temp:  [100.7 F (38.2 C)-100.9 F (38.3 C)] 100.7 F (38.2 C) (06/05 1113) Pulse Rate:  [115-128] 115 (06/05 1113) Resp:  [18-20] 20 (06/05 1113) BP: (138-149)/(86-90) 149/90 (06/05 1113) SpO2:  [96 %-98 %] 98 % (06/05 1113) Weight:  [113.4 kg] 113.4 kg (06/05 0943) General:  Alert and oriented, No acute distress HEENT: Normocephalic, atraumatic Neck: No JVD or lymphadenopathy Cardiovascular: Regular rate and rhythm Lungs: Tachycardic Abdomen: Soft, nontender, nondistended, no abdominal masses Back: No CVA tenderness Genitourinary: Normal phallus.  On the inferior portion of the scrotum, there is diffuse erythema and edema.  There is crepitus in the inferior portion of the scrotum.  There is no superficial necrosis. Extremities: No edema Neurologic: Grossly intact  Laboratory Data:  Results for orders placed or performed during the hospital encounter of 07/20/19 (from the past 24 hour(s))  Lactic acid, plasma  Status: None   Collection Time: 07/20/19 10:05 AM  Result Value Ref Range   Lactic Acid, Venous 1.5 0.5 - 1.9 mmol/L  Comprehensive metabolic panel     Status: Abnormal   Collection Time: 07/20/19 10:05 AM  Result Value Ref Range   Sodium 127 (L) 135 - 145 mmol/L   Potassium 3.8 3.5 - 5.1 mmol/L   Chloride 94 (L) 98 - 111 mmol/L   CO2 20 (L) 22 - 32  mmol/L   Glucose, Bld 307 (H) 70 - 99 mg/dL   BUN 11 6 - 20 mg/dL   Creatinine, Ser 1.10 0.61 - 1.24 mg/dL   Calcium 8.3 (L) 8.9 - 10.3 mg/dL   Total Protein 7.0 6.5 - 8.1 g/dL   Albumin 3.4 (L) 3.5 - 5.0 g/dL   AST 16 15 - 41 U/L   ALT 21 0 - 44 U/L   Alkaline Phosphatase 91 38 - 126 U/L   Total Bilirubin 1.6 (H) 0.3 - 1.2 mg/dL   GFR calc non Af Amer >60 >60 mL/min   GFR calc Af Amer >60 >60 mL/min   Anion gap 13 5 - 15  Urinalysis, Complete w Microscopic     Status: Abnormal   Collection Time: 07/20/19 10:59 AM  Result Value Ref Range   Color, Urine YELLOW (A) YELLOW   APPearance CLEAR (A) CLEAR   Specific Gravity, Urine 1.017 1.005 - 1.030   pH 6.0 5.0 - 8.0   Glucose, UA >=500 (A) NEGATIVE mg/dL   Hgb urine dipstick SMALL (A) NEGATIVE   Bilirubin Urine NEGATIVE NEGATIVE   Ketones, ur 20 (A) NEGATIVE mg/dL   Protein, ur NEGATIVE NEGATIVE mg/dL   Nitrite NEGATIVE NEGATIVE   Leukocytes,Ua NEGATIVE NEGATIVE   RBC / HPF 0-5 0 - 5 RBC/hpf   WBC, UA 0-5 0 - 5 WBC/hpf   Bacteria, UA NONE SEEN NONE SEEN   Squamous Epithelial / LPF NONE SEEN 0 - 5  CBC with Differential/Platelet     Status: Abnormal   Collection Time: 07/20/19 10:59 AM  Result Value Ref Range   WBC 30.9 (H) 4.0 - 10.5 K/uL   RBC 4.55 4.22 - 5.81 MIL/uL   Hemoglobin 15.0 13.0 - 17.0 g/dL   HCT 41.4 39.0 - 52.0 %   MCV 91.0 80.0 - 100.0 fL   MCH 33.0 26.0 - 34.0 pg   MCHC 36.2 (H) 30.0 - 36.0 g/dL   RDW 12.4 11.5 - 15.5 %   Platelets 193 150 - 400 K/uL   nRBC 0.0 0.0 - 0.2 %   Neutrophils Relative % 87 %   Neutro Abs 26.8 (H) 1.7 - 7.7 K/uL   Lymphocytes Relative 4 %   Lymphs Abs 1.3 0.7 - 4.0 K/uL   Monocytes Relative 7 %   Monocytes Absolute 2.2 (H) 0.1 - 1.0 K/uL   Eosinophils Relative 0 %   Eosinophils Absolute 0.0 0.0 - 0.5 K/uL   Basophils Relative 0 %   Basophils Absolute 0.1 0.0 - 0.1 K/uL   WBC Morphology MORPHOLOGY UNREMARKABLE    RBC Morphology MORPHOLOGY UNREMARKABLE    Smear Review  Normal platelet morphology    Immature Granulocytes 2 %   Abs Immature Granulocytes 0.46 (H) 0.00 - 0.07 K/uL   No results found for this or any previous visit (from the past 240 hour(s)). Creatinine: Recent Labs    07/20/19 1005  CREATININE 1.10   CT scan personally reviewed and is detailed in the history of present illness    Impression/Assessment:  Gas-forming scrotal abscess versus early Fournier's gangrene  Plan:  This is a potentially life-threatening emergency.  Plan to proceed emergently to the operating room for scrotal incision and drainage, possible debridement.  He understands potential for bleeding, infection, injury to surrounding structures, possibility of the need to remove scrotal skin.  IV access has been difficult.  Vancomycin and Zosyn are ordered.  They have not been given.  He last drank a couple of hours ago but due to the emergent nature, I think we should proceed as soon as Covid test come back.  Ray Church, III 07/20/2019, 2:03 PM  '

## 2019-07-20 NOTE — Anesthesia Preprocedure Evaluation (Addendum)
Anesthesia Evaluation  Patient identified by MRN, date of birth, ID band Patient awake    Reviewed: Allergy & Precautions, H&P , NPO status , Patient's Chart, lab work & pertinent test results  Airway Mallampati: III       Dental   Pulmonary Current Smoker,    breath sounds clear to auscultation       Cardiovascular hypertension, + CAD, + Past MI and + Cardiac Stents (2014)   Rhythm:regular     Neuro/Psych negative neurological ROS  negative psych ROS   GI/Hepatic Neg liver ROS, GERD  ,  Endo/Other  diabetes  Renal/GU      Musculoskeletal   Abdominal   Peds  Hematology negative hematology ROS (+)   Anesthesia Other Findings Sepsis  Past Medical History: 08/28/2012: Acute transmural inferior wall MI (HCC)     Comment:  .5 x 12 mm Veri-flex stent non-DES. No date: Chicken pox No date: Coronary artery disease     Comment:  Inferior ST elevation myocardial infarction in July of               2014. Cardiac catheterization showed 95% distal RCA               stenosis and 90% first diagonal stenosis with normal               ejection fraction. He underwent PCI in bare-metal stent               placement to the distal RCA. No date: Diabetes mellitus without complication (HCC) No date: Hypercholesterolemia No date: Tobacco use  Past Surgical History: No date: CORONARY ANGIOPLASTY WITH STENT PLACEMENT 08/28/2012: LEFT HEART CATHETERIZATION WITH CORONARY ANGIOGRAM; N/A     Comment:  Procedure: LEFT HEART CATHETERIZATION WITH CORONARY               ANGIOGRAM;  Surgeon: Pamella Pert, MD;  Location: [facility]              CATH LAB;  Service: Cardiovascular;  Laterality: N/A;  BMI    Body Mass Index: 35.87 kg/m      Reproductive/Obstetrics negative OB ROS                            Anesthesia Physical Anesthesia Plan  ASA: III and emergent  Anesthesia Plan: General ETT and Rapid Sequence    Post-op Pain Management:    Induction:   PONV Risk Score and Plan: Ondansetron, Dexamethasone and Treatment may vary due to age or medical condition  Airway Management Planned:   Additional Equipment:   Intra-op Plan:   Post-operative Plan:   Informed Consent: I have reviewed the patients History and Physical, chart, labs and discussed the procedure including the risks, benefits and alternatives for the proposed anesthesia with the patient or authorized representative who has indicated his/her understanding and acceptance.     Dental Advisory Given  Plan Discussed with: Anesthesiologist, CRNA and Surgeon  Anesthesia Plan Comments:        Anesthesia Quick Evaluation

## 2019-07-20 NOTE — ED Notes (Signed)
Pt given gown to change into.

## 2019-07-20 NOTE — ED Provider Notes (Signed)
[facility] Emergency Department Provider Note ____________________________________________   First MD Initiated Contact with Patient 07/20/19 1010     (approximate)  I have reviewed the triage vital signs and the nursing notes.   HISTORY  Chief Complaint Abscess    HPI Martin Mullen is a 39 y.o. male with PMH as noted below including history of diabetes on Metformin who presents with scrotal pain and swelling over the last several days, gradual onset, and associated with fever, malaise, and nausea.  The patient was at [facility] 2 days ago for an area of scrotal swelling which was thought to be an abscess.  I&D was attempted, and the patient was sent home on antibiotics.  He states that he has been taking them, but now the scrotum is significantly more swollen and he feels generally unwell.  Past Medical History:  Diagnosis Date  . Acute transmural inferior wall MI ([place]) 08/28/2012   .5 x 12 mm Veri-flex stent non-DES.  Marland Kitchen Chicken pox   . Coronary artery disease    Inferior ST elevation myocardial infarction in July of 2014. Cardiac catheterization showed 95% distal RCA stenosis and 90% first diagonal stenosis with normal ejection fraction. He underwent PCI in bare-metal stent placement to the distal RCA.  . Diabetes mellitus without complication ([place])   . Hypercholesterolemia   . Tobacco use     Patient Active Problem List   Diagnosis Date Noted  . Diabetes mellitus without complication ([place])   . Fournier's gangrene   . Tobacco abuse   . Chronic constipation 03/05/2019  . Sepsis ([place]) 03/18/2017  . Coronary artery disease   . DM type 2, uncontrolled, with neuropathy ([place]) 07/09/2013  . STEMI s/p RCA stent 08/2012 09/05/2012  . HTN (hypertension) 09/05/2012  . HLD (hyperlipidemia) 09/05/2012  . Adjustment disorder with mixed anxiety and depressed mood 09/05/2012    Past Surgical History:  Procedure Laterality Date  . CORONARY ANGIOPLASTY WITH STENT  PLACEMENT    . LEFT HEART CATHETERIZATION WITH CORONARY ANGIOGRAM N/A 08/28/2012   Procedure: LEFT HEART CATHETERIZATION WITH CORONARY ANGIOGRAM;  Surgeon: Laverda Page, MD;  Location: [facility] CATH LAB;  Service: Cardiovascular;  Laterality: N/A;    Prior to Admission medications   Medication Sig Start Date End Date Taking? Authorizing Provider  aspirin 81 MG EC tablet Take 1 tablet (81 mg total) by mouth daily. 09/05/12  Yes Rai, Ripudeep K, MD  glipiZIDE (GLUCOTROL) 10 MG tablet TAKE 1 TABLET(10 MG) BY MOUTH TWICE DAILY BEFORE A MEAL 07/02/19  Yes [name], [name], NP  insulin glargine (LANTUS SOLOSTAR) 100 UNIT/ML Solostar Pen Inject 23 Units into the skin daily. 05/24/19  Yes [name], [name], NP  losartan (COZAAR) 50 MG tablet Take 1 tablet (50 mg total) by mouth daily. 06/11/19  Yes [name], [name], NP  metFORMIN (GLUCOPHAGE) 1000 MG tablet TAKE 1 TABLET BY MOUTH TWICE DAILY WITH A MEAL Patient taking differently: Take 1,000 mg by mouth 2 (two) times daily with a meal. TAKE 1 TABLET BY MOUTH TWICE DAILY WITH A MEAL 07/10/19  Yes [name], [name], NP  omeprazole (PRILOSEC) 20 MG capsule Take 1 capsule (20 mg total) by mouth daily. 11/12/18  Yes [name], [name], NP  oxyCODONE (OXY IR/ROXICODONE) 5 MG immediate release tablet Take 5 mg by mouth 5 (five) times daily as needed. 07/19/19  Yes [provider]  simvastatin (ZOCOR) 80 MG tablet Take 1 tablet (80 mg total) by mouth daily. 09/27/18  Yes Webb Silversmith  W, NP  sitaGLIPtin (JANUVIA) 100 MG tablet Take 1 tablet (100 mg total) by mouth daily. 06/11/19  Yes [name], NP  Blood Glucose Monitoring Suppl (BAYER CONTOUR NEXT MONITOR) w/Device KIT 1 Device 07/22/15   [name], NP  CONTOUR NEXT TEST test strip USE 1 STRIP TO CHECK GLUCOSE THREE TIMES DAILY AS NEEDED 09/05/18   [name], NP  cyclobenzaprine (FLEXERIL) 10 MG tablet Take 1 tablet (10 mg total) by mouth 3 (three) times daily as needed. Patient not taking: Reported on  07/20/2019 05/05/19   Gregor Hams, MD  Insulin Pen Needle 31G X 8 MM MISC 1 each by Does not apply route daily. 11/09/18   [name], NP  nitroGLYCERIN (NITROSTAT) 0.4 MG SL tablet DISSOLVE ONE TABLET UNDER THE TONGUE EVERY 5 MINUTES AS NEEDED FOR CHEST PAIN.  DO NOT EXCEED A TOTAL OF 3 DOSES IN 15 MINUTES 04/26/17   [name], NP  sulfamethoxazole-trimethoprim (BACTRIM DS) 800-160 MG tablet Take 1 tablet by mouth 2 (two) times daily. Patient not taking: Reported on 07/20/2019 06/11/19   [name], NP    Allergies Patient has no known allergies.  Family History  Problem Relation Age of Onset  . Arthritis Mother        Rheumatoid  . Diabetes Mother   . Hyperlipidemia Mother   . Diabetes Father   . Cancer Maternal Grandfather        Prostate  . Parkinson's disease Maternal Grandfather     Social History Social History   Tobacco Use  . Smoking status: Current Every Day Smoker    Packs/day: 1.50    Years: 20.00    Pack years: 30.00    Types: Cigarettes  . Smokeless tobacco: Never Used  Substance Use Topics  . Alcohol use: Yes    Alcohol/week: 0.0 standard drinks    Comment: occasional  . Drug use: Yes    Types: Marijuana    Review of Systems  Constitutional: Positive for fever. Eyes: No redness. ENT: No sore throat. Cardiovascular: Denies chest pain. Respiratory: Denies shortness of breath. Gastrointestinal: Positive for nausea. Genitourinary: Negative for dysuria.  Positive for scrotal swelling. Musculoskeletal: Negative for back pain. Skin: Negative for rash. Neurological: Negative for headache.   ____________________________________________   PHYSICAL EXAM:  VITAL SIGNS: ED Triage Vitals  Enc Vitals Group     BP 07/20/19 0912 138/86     Pulse Rate 07/20/19 0912 (!) 128     Resp 07/20/19 0912 18     Temp 07/20/19 0912 (!) 100.9 F (38.3 C)     Temp Source 07/20/19 0912 Oral     SpO2 07/20/19 0912 96 %     Weight 07/20/19 0943 250 lb  (113.4 kg)     Height 07/20/19 0943 _0  (1.778 m)     Head Circumference --      Peak Flow --      Pain Score 07/20/19 0943 10     Pain Loc --      Pain Edu? --      Excl. in [place]? --     Constitutional: Alert and oriented.  Uncomfortable appearing but in no acute distress. Eyes: Conjunctivae are normal.  Head: Atraumatic. Nose: No congestion/rhinnorhea. Mouth/Throat: Mucous membranes are moist.   Neck: Normal range of motion.  Cardiovascular: Tachycardic, regular rhythm.   Good peripheral circulation. Respiratory: Normal respiratory effort.  No retractions. Gastrointestinal: No distention.  Genitourinary: Scrotum with moderate swelling although  mostly soft and nontender.  Underside of scrotum into the perineum with erythema and induration.  No localized fluctuant mass.  I&D scar from 2 days ago appears to be healing well and intact. Musculoskeletal: Extremities warm and well perfused.  Neurologic:  Normal speech and language. No gross focal neurologic deficits are appreciated.  Skin:  Skin is warm and dry. No rash noted. Psychiatric: Mood and affect are normal. Speech and behavior are normal.  ____________________________________________   LABS (all labs ordered are listed, but only abnormal results are displayed)  Labs Reviewed  COMPREHENSIVE METABOLIC PANEL - Abnormal; Notable for the following components:      Result Value   Sodium 127 (*)    Chloride 94 (*)    CO2 20 (*)    Glucose, Bld 307 (*)    Calcium 8.3 (*)    Albumin 3.4 (*)    Total Bilirubin 1.6 (*)    All other components within normal limits  URINALYSIS, COMPLETE (UACMP) WITH MICROSCOPIC - Abnormal; Notable for the following components:   Color, Urine YELLOW (*)    APPearance CLEAR (*)    Glucose, UA >=500 (*)    Hgb urine dipstick SMALL (*)    Ketones, ur 20 (*)    All other components within normal limits  CBC WITH DIFFERENTIAL/PLATELET - Abnormal; Notable for the following components:   WBC 30.9  (*)    MCHC 36.2 (*)    Neutro Abs 26.8 (*)    Monocytes Absolute 2.2 (*)    Abs Immature Granulocytes 0.46 (*)    All other components within normal limits  SARS CORONAVIRUS 2 BY RT PCR (HOSPITAL ORDER, [place] [facility])  CULTURE, BLOOD (ROUTINE X 2)  CULTURE, BLOOD (ROUTINE X 2)  LACTIC ACID, PLASMA  CBC WITH DIFFERENTIAL/PLATELET  URINE DRUG SCREEN, QUALITATIVE ([facility] ONLY)   ____________________________________________  EKG  ED ECG REPORT I, Arta Silence, the attending physician, personally viewed and interpreted this ECG.  Date: 07/20/2019 EKG Time: 0924 Rate: 132 Rhythm: Sinus tachycardia QRS Axis: normal Intervals: normal ST/T Wave abnormalities: normal Narrative Interpretation: no evidence of acute ischemia  ____________________________________________  RADIOLOGY  CT pelvis: Findings compatible with Fournier's gangrene with soft tissue swelling and air in the scrotum.  ____________________________________________   PROCEDURES  Procedure(s) performed: No  Procedures  Critical Care performed: Yes  CRITICAL CARE Performed by: Arta Silence   Total critical care time: 30 minutes  Critical care time was exclusive of separately billable procedures and treating other patients.  Critical care was necessary to treat or prevent imminent or life-threatening deterioration.  Critical care was time spent personally by me on the following activities: development of treatment plan with patient and/or surrogate as well as nursing, discussions with consultants, evaluation of patient's response to treatment, examination of patient, obtaining history from patient or surrogate, ordering and performing treatments and interventions, ordering and review of laboratory studies, ordering and review of radiographic studies, pulse oximetry and re-evaluation of patient's condition. ____________________________________________   INITIAL IMPRESSION /  ASSESSMENT AND PLAN / ED COURSE  Pertinent labs & imaging results that were available during my care of the patient were reviewed by me and considered in my medical decision making (see chart for details).  39 year old male with PMH as noted above including history of diabetes presents with scrotal pain and swelling over the last several days.  I reviewed the past medical records in care everywhere.  The patient was seen in the [facility] ED 2 days ago  for this.  At that time he was afebrile and well-appearing.  He had a 3 cm area of induration and tenderness.  Ultrasound was suggestive of an abscess, and I&D was attempted but there was no significant return of pus.  The patient was discharged on Keflex and Bactrim.  He has had perineal abscesses in the past.  On exam today, the patient is febrile and tachycardic with otherwise normal vital signs.  He appears somewhat tired but in no acute distress.  He has moderate swelling to the scrotum which she states is significantly worse than when he was seen 2 days ago.  Most of the scrotum is soft without induration, however there is an area approximately 5 to 7 cm on the underside of the scrotum with induration, erythema, and warmth.  Presentation is concerning for scrotal/perineal cellulitis or possible abscess.  Given the refractory symptoms and evidence of possible sepsis, I will obtain a CT to further evaluate for deep tissue abscess.  I do not suspect Fournier's gangrene at this time.  We will plan for IV antibiotics and likely admission.  ----------------------------------------- 1:46 PM on 07/20/2019 -----------------------------------------  CT shows findings concerning for Fournier's gangrene.  On reassessment, the patient is clinically stable.  I ordered broad-spectrum IV antibiotics.  I consulted Dr. Gloriann Loan from urology who came to evaluate the patient in the ED and plans to take him to the OR.  I then discussed the case with the hospitalist Dr. Blaine Hamper for  admission.  ____________________________________________   FINAL CLINICAL IMPRESSION(S) / ED DIAGNOSES  Final diagnoses:  Fournier gangrene      NEW MEDICATIONS STARTED DURING THIS VISIT:  New Prescriptions   No medications on file     Note:  This document was prepared using Dragon voice recognition software and may include unintentional dictation errors.   Arta Silence, MD 07/20/19 1439

## 2019-07-20 NOTE — Consult Note (Addendum)
H&P Physician requesting consult: Arta Silence, MD  Chief Complaint: Concern for Fournier's gangrene  History of Present Illness: 39 year old male with a history of poorly controlled diabetes.  He has had scrotal infections and required incision and drainage in the past.  Most recently, he developed some scrotal pain and swelling and went to [facility].  The emergency department performed a bedside I&D with minimal purulent output.  He was discharged with plans for follow-up.  However, he has had progressive scrotal and perineal pain as well swelling.  He presented here with leukocytosis of 30 and hyponatremia.  He is febrile with temperature of 100.9.  He has normal blood pressure but has some tachycardia.  He last ate last night.  He drank a little bit of water couple of hours ago.  CT scan was performed that revealed air within the scrotum.  He has uncontrolled diabetes.  Urinalysis was negative.  Pharmacy is working on obtaining his home medications.  Past Medical History:  Diagnosis Date  . Acute transmural inferior wall MI ([place]) 08/28/2012   .5 x 12 mm Veri-flex stent non-DES.  Marland Kitchen Chicken pox   . Coronary artery disease    Inferior ST elevation myocardial infarction in July of 2014. Cardiac catheterization showed 95% distal RCA stenosis and 90% first diagonal stenosis with normal ejection fraction. He underwent PCI in bare-metal stent placement to the distal RCA.  . Diabetes mellitus without complication ([place])   . Hypercholesterolemia   . Tobacco use    Past Surgical History:  Procedure Laterality Date  . CORONARY ANGIOPLASTY WITH STENT PLACEMENT    . LEFT HEART CATHETERIZATION WITH CORONARY ANGIOGRAM N/A 08/28/2012   Procedure: LEFT HEART CATHETERIZATION WITH CORONARY ANGIOGRAM;  Surgeon: Laverda Page, MD;  Location: [facility] CATH LAB;  Service: Cardiovascular;  Laterality: N/A;    Home Medications:  (Not in a hospital admission)  Allergies: No Known Allergies  Family History   Problem Relation Age of Onset  . Arthritis Mother        Rheumatoid  . Diabetes Mother   . Hyperlipidemia Mother   . Diabetes Father   . Cancer Maternal Grandfather        Prostate  . Parkinson's disease Maternal Grandfather    Social History:  reports that he has been smoking cigarettes. He has a 30.00 pack-year smoking history. He has never used smokeless tobacco. He reports current alcohol use. He reports current drug use. Drug: Marijuana.  ROS: A complete review of systems was performed.  All systems are negative except for pertinent findings as noted. ROS   Physical Exam:  Vital signs in last 24 hours: Temp:  [100.7 F (38.2 C)-100.9 F (38.3 C)] 100.7 F (38.2 C) (06/05 1113) Pulse Rate:  [115-128] 115 (06/05 1113) Resp:  [18-20] 20 (06/05 1113) BP: (138-149)/(86-90) 149/90 (06/05 1113) SpO2:  [96 %-98 %] 98 % (06/05 1113) Weight:  [113.4 kg] 113.4 kg (06/05 0943) General:  Alert and oriented, No acute distress HEENT: Normocephalic, atraumatic Neck: No JVD or lymphadenopathy Cardiovascular: Regular rate and rhythm Lungs: Tachycardic Abdomen: Soft, nontender, nondistended, no abdominal masses Back: No CVA tenderness Genitourinary: Normal phallus.  On the inferior portion of the scrotum, there is diffuse erythema and edema.  There is crepitus in the inferior portion of the scrotum.  There is no superficial necrosis. Extremities: No edema Neurologic: Grossly intact  Laboratory Data:  Results for orders placed or performed during the hospital encounter of 07/20/19 (from the past 24 hour(s))  Lactic acid, plasma  Status: None   Collection Time: 07/20/19 10:05 AM  Result Value Ref Range   Lactic Acid, Venous 1.5 0.5 - 1.9 mmol/L  Comprehensive metabolic panel     Status: Abnormal   Collection Time: 07/20/19 10:05 AM  Result Value Ref Range   Sodium 127 (L) 135 - 145 mmol/L   Potassium 3.8 3.5 - 5.1 mmol/L   Chloride 94 (L) 98 - 111 mmol/L   CO2 20 (L) 22 - 32  mmol/L   Glucose, Bld 307 (H) 70 - 99 mg/dL   BUN 11 6 - 20 mg/dL   Creatinine, Ser 6.64 0.61 - 1.24 mg/dL   Calcium 8.3 (L) 8.9 - 10.3 mg/dL   Total Protein 7.0 6.5 - 8.1 g/dL   Albumin 3.4 (L) 3.5 - 5.0 g/dL   AST 16 15 - 41 U/L   ALT 21 0 - 44 U/L   Alkaline Phosphatase 91 38 - 126 U/L   Total Bilirubin 1.6 (H) 0.3 - 1.2 mg/dL   GFR calc non Af Amer >60 >60 mL/min   GFR calc Af Amer >60 >60 mL/min   Anion gap 13 5 - 15  Urinalysis, Complete w Microscopic     Status: Abnormal   Collection Time: 07/20/19 10:59 AM  Result Value Ref Range   Color, Urine YELLOW (A) YELLOW   APPearance CLEAR (A) CLEAR   Specific Gravity, Urine 1.017 1.005 - 1.030   pH 6.0 5.0 - 8.0   Glucose, UA >=500 (A) NEGATIVE mg/dL   Hgb urine dipstick SMALL (A) NEGATIVE   Bilirubin Urine NEGATIVE NEGATIVE   Ketones, ur 20 (A) NEGATIVE mg/dL   Protein, ur NEGATIVE NEGATIVE mg/dL   Nitrite NEGATIVE NEGATIVE   Leukocytes,Ua NEGATIVE NEGATIVE   RBC / HPF 0-5 0 - 5 RBC/hpf   WBC, UA 0-5 0 - 5 WBC/hpf   Bacteria, UA NONE SEEN NONE SEEN   Squamous Epithelial / LPF NONE SEEN 0 - 5  CBC with Differential/Platelet     Status: Abnormal   Collection Time: 07/20/19 10:59 AM  Result Value Ref Range   WBC 30.9 (H) 4.0 - 10.5 K/uL   RBC 4.55 4.22 - 5.81 MIL/uL   Hemoglobin 15.0 13.0 - 17.0 g/dL   HCT 40.3 47.4 - 25.9 %   MCV 91.0 80.0 - 100.0 fL   MCH 33.0 26.0 - 34.0 pg   MCHC 36.2 (H) 30.0 - 36.0 g/dL   RDW 56.3 87.5 - 64.3 %   Platelets 193 150 - 400 K/uL   nRBC 0.0 0.0 - 0.2 %   Neutrophils Relative % 87 %   Neutro Abs 26.8 (H) 1.7 - 7.7 K/uL   Lymphocytes Relative 4 %   Lymphs Abs 1.3 0.7 - 4.0 K/uL   Monocytes Relative 7 %   Monocytes Absolute 2.2 (H) 0.1 - 1.0 K/uL   Eosinophils Relative 0 %   Eosinophils Absolute 0.0 0.0 - 0.5 K/uL   Basophils Relative 0 %   Basophils Absolute 0.1 0.0 - 0.1 K/uL   WBC Morphology MORPHOLOGY UNREMARKABLE    RBC Morphology MORPHOLOGY UNREMARKABLE    Smear Review  Normal platelet morphology    Immature Granulocytes 2 %   Abs Immature Granulocytes 0.46 (H) 0.00 - 0.07 K/uL   No results found for this or any previous visit (from the past 240 hour(s)). Creatinine: Recent Labs    07/20/19 1005  CREATININE 1.10   CT scan personally reviewed and is detailed in the history of present illness  Impression/Assessment:  Gas-forming scrotal abscess versus early Fournier's gangrene  Plan:  This is a potentially life-threatening emergency.  Plan to proceed emergently to the operating room for scrotal incision and drainage, possible debridement.  He understands potential for bleeding, infection, injury to surrounding structures, possibility of the need to remove scrotal skin.  IV access has been difficult.  Vancomycin and Zosyn are ordered.  They have not been given.  He last drank a couple of hours ago but due to the emergent nature, I think we should proceed as soon as Covid test come back.  Ray Church, III 07/20/2019, 2:03 PM  '

## 2019-07-20 NOTE — ED Triage Notes (Signed)
Pt comes to ED for an absess under his testicles- pt was seen yesterday at [facility] who tried to drain it but was not able to get anything out of it- Pt states that it has been there for about 5 days and that he has noticed scrotal swelling as well

## 2019-07-20 NOTE — H&P (Signed)
History and Physical    Martin Mullen ZOX:[id number] DOB: 12/04/1980 DOA: 07/20/2019  Referring MD/NP/PA:   PCP: [name], NP   Patient coming from:  The patient is coming from home.  At baseline, pt is independent for most of ADL.        Chief Complaint: Scrotal pain  HPI: Martin Mullen is a 39 y.o. male with medical history significant of hyperlipidemia, diabetes mellitus, GERD, tobacco abuse, CAD, myocardial infarction (known DES stent placement), scrotal infections (required incision and drainage), who presents with scrotal pain.  Patient states that he has been scrotal pain in the past 5 days, which is worsened today.  The pain is constant, moderate, sharp, nonradiating.  The scrotum seems to be erythematous and mildly swollen.  No injury. Patient also has fever and chills.  Denies nausea, vomiting, diarrhea, abdominal pain, symptoms of UTI.  Patient does not have chest pain, shortness breath, cough.   ED Course: pt was found to have WBC 30.9, negative urinalysis, pending COVID-19 PCR, lactic acid 1.5, INR 1.5, renal function okay, sodium 127 (corrected to 130-132), temperature 100.7, tachycardia, tachypnea, oxygen saturation 98% on room air, blood pressure 149/90.  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  Patient is admitted to [facility] bed as an inpatient.  Urology, Dr. Gloriann Loan was consulted.  Review of Systems:   General: has fevers, chills, no body weight gain, has fatigue HEENT: no blurry vision, hearing changes or sore throat Respiratory: no dyspnea, coughing, wheezing CV: no chest pain, no palpitations GI: no nausea, vomiting, abdominal pain, diarrhea, constipation GU: no dysuria, burning on urination, increased urinary frequency, hematuria. Has scrotal pain. Ext: no leg edema Neuro: no unilateral weakness, numbness, or tingling, no vision change or hearing loss Skin: no rash, no skin tear. MSK: No muscle spasm, no deformity, no limitation of range of movement  in spin Heme: No easy bruising.  Travel history: No recent long distant travel.  Allergy: No Known Allergies  Past Medical History:  Diagnosis Date  . Acute transmural inferior wall MI ([place]) 08/28/2012   .5 x 12 mm Veri-flex stent non-DES.  Marland Kitchen Chicken pox   . Coronary artery disease    Inferior ST elevation myocardial infarction in July of 2014. Cardiac catheterization showed 95% distal RCA stenosis and 90% first diagonal stenosis with normal ejection fraction. He underwent PCI in bare-metal stent placement to the distal RCA.  . Diabetes mellitus without complication ([place])   . Hypercholesterolemia   . Tobacco use     Past Surgical History:  Procedure Laterality Date  . CORONARY ANGIOPLASTY WITH STENT PLACEMENT    . LEFT HEART CATHETERIZATION WITH CORONARY ANGIOGRAM N/A 08/28/2012   Procedure: LEFT HEART CATHETERIZATION WITH CORONARY ANGIOGRAM;  Surgeon: Laverda Page, MD;  Location: [facility] CATH LAB;  Service: Cardiovascular;  Laterality: N/A;    Social History:  reports that he has been smoking cigarettes. He has a 30.00 pack-year smoking history. He has never used smokeless tobacco. He reports current alcohol use. He reports current drug use. Drug: Marijuana.  Family History:  Family History  Problem Relation Age of Onset  . Arthritis Mother        Rheumatoid  . Diabetes Mother   . Hyperlipidemia Mother   . Diabetes Father   . Cancer Maternal Grandfather        Prostate  . Parkinson's disease Maternal Grandfather      Prior to Admission medications   Medication Sig Start Date End Date Taking? Authorizing Provider  aspirin  81 MG EC tablet Take 1 tablet (81 mg total) by mouth daily. 09/05/12   Rai, Vernelle Emerald, MD  atorvastatin (LIPITOR) 80 MG tablet Take 80 mg by mouth daily. 07/01/19   [provider]  Blood Glucose Monitoring Suppl (BAYER CONTOUR NEXT MONITOR) w/Device KIT 1 Device 07/22/15   [name], NP  cephALEXin (KEFLEX) 500 MG capsule Take 500 mg by mouth  4 (four) times daily. 07/18/19   [provider]  CONTOUR NEXT TEST test strip USE 1 STRIP TO CHECK GLUCOSE THREE TIMES DAILY AS NEEDED 09/05/18   [name], NP  cyclobenzaprine (FLEXERIL) 10 MG tablet Take 1 tablet (10 mg total) by mouth 3 (three) times daily as needed. 05/05/19   Gregor Hams, MD  glipiZIDE (GLUCOTROL) 10 MG tablet TAKE 1 TABLET(10 MG) BY MOUTH TWICE DAILY BEFORE A MEAL 07/02/19   [name], [name], NP  insulin glargine (LANTUS SOLOSTAR) 100 UNIT/ML Solostar Pen Inject 23 Units into the skin daily. 05/24/19   [name], NP  Insulin Pen Needle 31G X 8 MM MISC 1 each by Does not apply route daily. 11/09/18   [name], NP  losartan (COZAAR) 50 MG tablet Take 1 tablet (50 mg total) by mouth daily. 06/11/19   [name], NP  metFORMIN (GLUCOPHAGE) 1000 MG tablet TAKE 1 TABLET BY MOUTH TWICE DAILY WITH A MEAL 07/10/19   [name], [name], NP  nitroGLYCERIN (NITROSTAT) 0.4 MG SL tablet DISSOLVE ONE TABLET UNDER THE TONGUE EVERY 5 MINUTES AS NEEDED FOR CHEST PAIN.  DO NOT EXCEED A TOTAL OF 3 DOSES IN 15 MINUTES 04/26/17   [name], NP  Omega-3 Fatty Acids (FISH OIL) 1200 MG CAPS Take 1,200 mg by mouth 3 (three) times daily.    [provider]  omeprazole (PRILOSEC) 20 MG capsule Take 1 capsule (20 mg total) by mouth daily. 11/12/18   [name], NP  oxyCODONE (OXY IR/ROXICODONE) 5 MG immediate release tablet Take 5 mg by mouth 5 (five) times daily as needed. 07/19/19   [provider]  simvastatin (ZOCOR) 80 MG tablet Take 1 tablet (80 mg total) by mouth daily. 09/27/18   [name], NP  sitaGLIPtin (JANUVIA) 100 MG tablet Take 1 tablet (100 mg total) by mouth daily. 06/11/19   [name], NP  sulfamethoxazole-trimethoprim (BACTRIM DS) 800-160 MG tablet Take 1 tablet by mouth 2 (two) times daily. 06/11/19   [name], NP    Physical Exam: Vitals:   07/20/19 1754 07/20/19 1759 07/20/19 1803 07/20/19 1814  BP:   122/85 126/86   Pulse: (!) 102 (!) 101 98 (!) 102  Resp:   14 16  Temp:    98.8 F (37.1 C)  TempSrc:    Oral  SpO2: 94% 91% 92% 95%  Weight:      Height:       General: Not in acute distress HEENT:       Eyes: PERRL, EOMI, no scleral icterus.       ENT: No discharge from the ears and nose, no pharynx injection, no tonsillar enlargement.        Neck: No JVD, no bruit, no mass felt. Heme: No neck lymph node enlargement. Cardiac: S1/S2, RRR, No murmurs, No gallops or rubs. Respiratory:  No rales, wheezing, rhonchi or rubs. GI: Soft, nondistended, nontender, no rebound pain, no organomegaly, BS present. GU: has numbness, erythema and swelling in inferior portion of scrotum Ext: No pitting  leg edema bilaterally. 2+DP/PT pulse bilaterally. Musculoskeletal: No joint deformities, No joint redness or warmth, no limitation of ROM in spin. Skin: No rashes.  Neuro: Alert, oriented X3, cranial nerves II-XII grossly intact, moves all extremities normally.   Psych: Patient is not psychotic, no suicidal or hemocidal ideation.  Labs on Admission: I have personally reviewed following labs and imaging studies  CBC: Recent Labs  Lab 07/20/19 1059  WBC 30.9*  NEUTROABS 26.8*  HGB 15.0  HCT 41.4  MCV 91.0  PLT 643   Basic Metabolic Panel: Recent Labs  Lab 07/20/19 1005  NA 127*  K 3.8  CL 94*  CO2 20*  GLUCOSE 307*  BUN 11  CREATININE 1.10  CALCIUM 8.3*   GFR: Estimated Creatinine Clearance: 113.8 mL/min (by C-G formula based on SCr of 1.1 mg/dL). Liver Function Tests: Recent Labs  Lab 07/20/19 1005  AST 16  ALT 21  ALKPHOS 91  BILITOT 1.6*  PROT 7.0  ALBUMIN 3.4*   No results for input(s): LIPASE, AMYLASE in the last 168 hours. No results for input(s): AMMONIA in the last 168 hours. Coagulation Profile: No results for input(s): INR, PROTIME in the last 168 hours. Cardiac Enzymes: No results for input(s): CKTOTAL, CKMB, CKMBINDEX, TROPONINI in the last 168 hours. BNP (last 3  results) No results for input(s): PROBNP in the last 8760 hours. HbA1C: No results for input(s): HGBA1C in the last 72 hours. CBG: Recent Labs  Lab 07/20/19 1554 07/20/19 1725 07/20/19 1811  GLUCAP 231* 246* 275*   Lipid Profile: No results for input(s): CHOL, HDL, LDLCALC, TRIG, CHOLHDL, LDLDIRECT in the last 72 hours. Thyroid Function Tests: No results for input(s): TSH, T4TOTAL, FREET4, T3FREE, THYROIDAB in the last 72 hours. Anemia Panel: No results for input(s): VITAMINB12, FOLATE, FERRITIN, TIBC, IRON, RETICCTPCT in the last 72 hours. Urine analysis:    Component Value Date/Time   COLORURINE YELLOW (A) 07/20/2019 1059   APPEARANCEUR CLEAR (A) 07/20/2019 1059   LABSPEC 1.017 07/20/2019 1059   PHURINE 6.0 07/20/2019 1059   GLUCOSEU >=500 (A) 07/20/2019 1059   HGBUR SMALL (A) 07/20/2019 1059   BILIRUBINUR NEGATIVE 07/20/2019 1059   KETONESUR 20 (A) 07/20/2019 1059   PROTEINUR NEGATIVE 07/20/2019 1059   NITRITE NEGATIVE 07/20/2019 1059   LEUKOCYTESUR NEGATIVE 07/20/2019 1059   Sepsis Labs: _0 (procalcitonin:4,lacticidven:4) ) Recent Results (from the past 240 hour(s))  SARS Coronavirus 2 by RT PCR (hospital order, performed in [place] hospital lab) Nasopharyngeal Nasopharyngeal Swab     Status: None   Collection Time: 07/20/19  2:08 PM   Specimen: Nasopharyngeal Swab  Result Value Ref Range Status   SARS Coronavirus 2 NEGATIVE NEGATIVE Final    Comment: (NOTE) SARS-CoV-2 target nucleic acids are NOT DETECTED. The SARS-CoV-2 RNA is generally detectable in upper and lower respiratory specimens during the acute phase of infection. The lowest concentration of SARS-CoV-2 viral copies this assay can detect is 250 copies / mL. A negative result does not preclude SARS-CoV-2 infection and should not be used as the sole basis for treatment or other patient management decisions.  A negative result may occur with improper specimen collection / handling, submission  of specimen other than nasopharyngeal swab, presence of viral mutation(s) within the areas targeted by this assay, and inadequate number of viral copies (<250 copies / mL). A negative result must be combined with clinical observations, patient history, and epidemiological information. Fact Sheet for Patients:   StrictlyIdeas.no Fact Sheet for Healthcare Providers: BankingDealers.co.za This test is not yet  approved or cleared  by the [place] and has been authorized for detection and/or diagnosis of SARS-CoV-2 by FDA under an Emergency Use Authorization (EUA).  This EUA will remain in effect (meaning this test can be used) for the duration of the COVID-19 declaration under Section 564(b)(1) of the Act, 21 U.S.C. section 360bbb-3(b)(1), unless the authorization is terminated or revoked sooner. Performed at [facility], [address] [place], [place] [address]      Radiological Exams on Admission: CT PELVIS W CONTRAST  Result Date: 07/20/2019 CLINICAL DATA:  Abscess under the testicles. Patient is seen at [facility] yesterday. Abscess drain was attempted, but no fluid was obtained. Symptoms for 5 days. Scrotal swelling. EXAM: CT PELVIS WITH CONTRAST TECHNIQUE: Multidetector CT imaging of the pelvis was performed using the standard protocol following the bolus administration of intravenous contrast. CONTRAST:  157m OMNIPAQUE IOHEXOL 300 MG/ML  SOLN COMPARISON:  05/05/2019 FINDINGS: Urinary Tract: Distal ureters are unremarkable. Bladder is distended. Visualized course of the urethra is unremarkable. Bowel: Normal appearance of the visualized large and small bowel loops. Normal appendix. Vascular/Lymphatic: Mildly enlarged inguinal lymph nodes bilaterally, LEFT greater than RIGHT. Largest lymph node measures 1.5 centimeters in short axis. There are small bilateral external iliac lymph nodes, largest measuring 1.1 centimeters on the LEFT.  Reproductive: There are faint calcifications within the prostate gland. Seminal vesicles are partially calcified. There is diffuse scrotal wall swelling. Locules of gas are identified within the posterior aspect of the scrotum, measuring up to 3.7 x 3.1 centimeters. Air extends inferiorly he continues anteriorly along the MEDIAL raphe of the scrotum. No definite fluid collection identified. Other:  Anterior abdominal wall is unremarkable.  No ascites. Musculoskeletal: No acute abnormality. IMPRESSION: 1. Diffuse scrotal wall swelling and gas, consistent with Fournier's gangrene. 2. No definite fluid collection identified. 3. Bilateral inguinal and external iliac lymph nodes, likely reactive. 4. Distended urinary bladder. Critical Value/emergent results were called by telephone at the time of interpretation on 07/20/2019 at 1:00 pm [facility] who verbally acknowledged these results. Electronically Signed   By: ENolon NationsM.D.   On: 07/20/2019 13:01     EKG: Independently reviewed.  Sinus rhythm, tachycardia, QTC 435, LAE, nonspecific T wave change   Assessment/Plan Principal Problem:   Fournier's gangrene Active Problems:   HTN (hypertension)   HLD (hyperlipidemia)   Coronary artery disease   Sepsis ([place]   Diabetes mellitus without complication (H[facility])   Tobacco abuse   Hyponatremia   GERD (gastroesophageal reflux disease)   Sepsis due to fournier's gangrene: Patient meets criteria for sepsis with leukocytosis, fever, tachycardia.  Lactic acid normal.  Currently hemodynamically stable.  Urology was consulted, Dr. BGloriann Loandid scrotal exploration with scrotal debridement.   [facility] -Vancomycin and Zosyn -Follow-up blood culture and urine culture -will get Procalcitonin and trend lactic acid levels per sepsis protocol. -IVF: 3L of NS bolus in ED, followed by 75 cc/h   Essential hypertension: -prn Hydralazine -hold home Cozar since pt  is high risk of developing hypotension due to sepsis  HLD (hyperlipidemia) -Lipitor  Coronary artery disease: s/p of non-DES. No CP -ASA, lipitro  Diabetes mellitus without complication ([place]: Most recent A1c 7.5 , poorly controled. Patient is taking Januvia, glipizide, Metformin, Lantus at home -will decrease Lantus dose from 23 to 16 unit daily -SSI  Tobacco abuse -Nicotine patch  Hyponatremia: Na corrected to 130-132. -on IV NS as above  GERD: -Protonix  DVT ppx: SCD Code Status: Full code Family Communication: not done, no family member is at bed side.  Disposition Plan:  Anticipate discharge back to previous environment Consults called:  Dr. Gloriann Loan of urology Admission status: Med-surg bed as inpt    Status is: Inpatient  Remains inpatient appropriate because:Inpatient level of care appropriate due to severity of illness.  Patient has multiple comorbidities, now presents with sepsis due to Fournier gangrene.  Patient will need surgery.  Her presentation is highly complicated.  Patient is at high risk for deteriorating, such as developing hypotension due to sepsis.  Patient will need to be treated in hospital for at least 2 days.   Dispo: The patient is from: Home              Anticipated d/c is to: Home              Anticipated d/c date is: 2 days              Patient currently is not medically stable to d/c.           Date of Service 07/20/2019    [facility]   If 7PM-7AM, please contact night-coverage [email or web address] 07/20/2019, 6:59 PM

## 2019-07-20 NOTE — ED Notes (Signed)
Report given to [place], Charity fundraiser in [place]

## 2019-07-20 NOTE — Transfer of Care (Signed)
Immediate Anesthesia Transfer of Care Note  Patient: Martin Mullen  Procedure(s) Performed: INCISION AND DRAINAGE ABSCESS (N/A Scrotum)  Patient Location: PACU  Anesthesia Type:General  Level of Consciousness: drowsy, patient cooperative and responds to stimulation  Airway & Oxygen Therapy: Patient Spontanous Breathing and Patient connected to face mask oxygen  Post-op Assessment: Report given to RN and Post -op Vital signs reviewed and stable  Post vital signs: Reviewed and stable  Last Vitals:  Vitals Value Taken Time  BP 107/71 07/20/19 1717  Temp 36.4 C 07/20/19 1717  Pulse 99 07/20/19 1719  Resp 16 07/20/19 1719  SpO2 98 % 07/20/19 1719  Vitals shown include unvalidated device data.  Last Pain:  Vitals:   07/20/19 1717  TempSrc:   PainSc: Asleep         Complications: No apparent anesthesia complications

## 2019-07-21 DIAGNOSIS — N493 Fournier gangrene: Secondary | ICD-10-CM | POA: Diagnosis not present

## 2019-07-21 LAB — BASIC METABOLIC PANEL
Anion gap: 9 (ref 5–15)
BUN: 18 mg/dL (ref 6–20)
CO2: 23 mmol/L (ref 22–32)
Calcium: 7.5 mg/dL — ABNORMAL LOW (ref 8.9–10.3)
Chloride: 101 mmol/L (ref 98–111)
Creatinine, Ser: 1.6 mg/dL — ABNORMAL HIGH (ref 0.61–1.24)
GFR calc Af Amer: >60 mL/min (ref >60)
GFR calc non Af Amer: 53 mL/min — ABNORMAL LOW (ref >60)
Glucose, Bld: 285 mg/dL — ABNORMAL HIGH (ref 70–99)
Potassium: 3.6 mmol/L (ref 3.5–5.1)
Sodium: 133 mmol/L — ABNORMAL LOW (ref 135–145)

## 2019-07-21 LAB — CBC
HCT: 35.1 % — ABNORMAL LOW (ref 39.0–52.0)
Hemoglobin: 12.4 g/dL — ABNORMAL LOW (ref 13.0–17.0)
MCH: 32.4 pg (ref 26.0–34.0)
MCHC: 35.3 g/dL (ref 30.0–36.0)
MCV: 91.6 fL (ref 80.0–100.0)
Platelets: 164 10*3/uL (ref 150–400)
RBC: 3.83 MIL/uL — ABNORMAL LOW (ref 4.22–5.81)
RDW: 12.6 % (ref 11.5–15.5)
WBC: 21.2 10*3/uL — ABNORMAL HIGH (ref 4.0–10.5)
nRBC: 0 % (ref 0.0–0.2)

## 2019-07-21 LAB — GLUCOSE, CAPILLARY
Glucose-Capillary: 234 mg/dL — ABNORMAL HIGH (ref 70–99)
Glucose-Capillary: 272 mg/dL — ABNORMAL HIGH (ref 70–99)
Glucose-Capillary: 273 mg/dL — ABNORMAL HIGH (ref 70–99)
Glucose-Capillary: 302 mg/dL — ABNORMAL HIGH (ref 70–99)
Glucose-Capillary: 303 mg/dL — ABNORMAL HIGH (ref 70–99)
Glucose-Capillary: 360 mg/dL — ABNORMAL HIGH (ref 70–99)

## 2019-07-21 LAB — HIV ANTIBODY (ROUTINE TESTING W REFLEX): HIV Screen 4th Generation wRfx: NONREACTIVE

## 2019-07-21 MED ORDER — INSULIN GLARGINE 100 UNIT/ML ~~LOC~~ SOLN
20.0000 [IU] | Freq: Every day | SUBCUTANEOUS | Status: DC
  Administered 2019-07-21: 20 [IU] via SUBCUTANEOUS
  Filled 2019-07-21 (×2): qty 0.2

## 2019-07-21 MED ORDER — VANCOMYCIN HCL 750 MG/150ML IV SOLN
750.0000 mg | Freq: Two times a day (BID) | INTRAVENOUS | Status: DC
  Administered 2019-07-21: 750 mg via INTRAVENOUS
  Filled 2019-07-21 (×3): qty 150

## 2019-07-21 MED ORDER — INSULIN ASPART 100 UNIT/ML ~~LOC~~ SOLN
0.0000 [IU] | Freq: Three times a day (TID) | SUBCUTANEOUS | Status: DC
  Administered 2019-07-21: 11 [IU] via SUBCUTANEOUS
  Administered 2019-07-22: 8 [IU] via SUBCUTANEOUS
  Administered 2019-07-22 (×2): 5 [IU] via SUBCUTANEOUS
  Administered 2019-07-23 (×2): 2 [IU] via SUBCUTANEOUS
  Administered 2019-07-23: 5 [IU] via SUBCUTANEOUS
  Administered 2019-07-24: 2 [IU] via SUBCUTANEOUS
  Administered 2019-07-24 – 2019-07-25 (×3): 3 [IU] via SUBCUTANEOUS
  Administered 2019-07-25: 5 [IU] via SUBCUTANEOUS
  Administered 2019-07-26: 8 [IU] via SUBCUTANEOUS
  Filled 2019-07-21 (×13): qty 1

## 2019-07-21 MED ORDER — CHLORHEXIDINE GLUCONATE CLOTH 2 % EX PADS
6.0000 | MEDICATED_PAD | Freq: Every day | CUTANEOUS | Status: DC
  Administered 2019-07-21 – 2019-07-25 (×4): 6 via TOPICAL

## 2019-07-21 MED ORDER — INSULIN ASPART 100 UNIT/ML ~~LOC~~ SOLN
5.0000 [IU] | Freq: Three times a day (TID) | SUBCUTANEOUS | Status: DC
  Administered 2019-07-21 – 2019-07-22 (×2): 5 [IU] via SUBCUTANEOUS
  Filled 2019-07-21 (×2): qty 1

## 2019-07-21 MED ORDER — ENOXAPARIN SODIUM 40 MG/0.4ML ~~LOC~~ SOLN
40.0000 mg | Freq: Every day | SUBCUTANEOUS | Status: DC
  Administered 2019-07-21 – 2019-07-26 (×6): 40 mg via SUBCUTANEOUS
  Filled 2019-07-21 (×6): qty 0.4

## 2019-07-21 MED ORDER — SODIUM CHLORIDE 0.9 % IV SOLN
INTRAVENOUS | Status: DC | PRN
  Administered 2019-07-21: 250 mL via INTRAVENOUS
  Administered 2019-07-23 – 2019-07-24 (×2): 500 mL via INTRAVENOUS

## 2019-07-21 MED ORDER — HYDROMORPHONE HCL 1 MG/ML IJ SOLN
1.0000 mg | Freq: Once | INTRAMUSCULAR | Status: AC
  Administered 2019-07-21: 1 mg via INTRAVENOUS
  Filled 2019-07-21: qty 1

## 2019-07-21 MED ORDER — SODIUM CHLORIDE 0.9% FLUSH
10.0000 mL | Freq: Two times a day (BID) | INTRAVENOUS | Status: DC
  Administered 2019-07-22 – 2019-07-26 (×5): 10 mL

## 2019-07-21 MED ORDER — SODIUM CHLORIDE 0.9% FLUSH: 10.0000 mL | INTRAVENOUS | Status: DC | PRN

## 2019-07-21 MED ORDER — INSULIN ASPART 100 UNIT/ML ~~LOC~~ SOLN
0.0000 [IU] | Freq: Every day | SUBCUTANEOUS | Status: DC
  Administered 2019-07-21: 3 [IU] via SUBCUTANEOUS
  Administered 2019-07-23 – 2019-07-25 (×2): 2 [IU] via SUBCUTANEOUS
  Filled 2019-07-21 (×4): qty 1

## 2019-07-21 NOTE — Progress Notes (Signed)
Urology Inpatient Progress Report  Fournier gangrene [N49.3]  Procedure(s): INCISION AND DRAINAGE ABSCESS  1 Day Post-Op   Intv/Subj: No acute events overnight. Patient is without complaint. Maximum temperature 100 at 115 this morning.  Temperature is normal now.  Vital signs stable.  He currently is comfortable without pain.  He feels much improved.  Gram stain showed moderate gram-positive cocci and moderate gram-negative rods.  Blood culture negative x 12 hours.  Currently remains on vancomycin and Zosyn.  Creatinine is 1.6.  His hyponatremia is improving.  Leukocytosis is improving with a white blood cell count of 21.2 from 30.9.  His glucose remains uncontrolled.  Principal Problem:   Fournier's gangrene Active Problems:   HTN (hypertension)   HLD (hyperlipidemia)   Coronary artery disease   Sepsis (HCC)   Diabetes mellitus without complication (HCC)   Tobacco abuse   Hyponatremia   GERD (gastroesophageal reflux disease)  Current Facility-Administered Medications  Medication Dose Route Frequency Provider Last Rate Last Admin  . 0.9 %  sodium chloride infusion   Intravenous Continuous Lolita Patella B, MD 125 mL/hr at 07/21/19 0843 New Bag at 07/21/19 0843  . 0.9 %  sodium chloride infusion   Intravenous PRN Lorretta Harp, MD 10 mL/hr at 07/21/19 0511 250 mL at 07/21/19 0511  . acetaminophen (TYLENOL) tablet 650 mg  650 mg Oral Q6H PRN Lorretta Harp, MD   650 mg at 07/21/19 0129  . aspirin EC tablet 81 mg  81 mg Oral Daily Lorretta Harp, MD   81 mg at 07/21/19 0839  . atorvastatin (LIPITOR) tablet 40 mg  40 mg Oral Daily Lorretta Harp, MD   40 mg at 07/21/19 0840  . Chlorhexidine Gluconate Cloth 2 % PADS 6 each  6 each Topical Daily Lorretta Harp, MD   6 each at 07/21/19 [phone]  . hydrALAZINE (APRESOLINE) injection 5 mg  5 mg Intravenous Q2H PRN Lorretta Harp, MD      . insulin aspart (novoLOG) injection 0-9 Units  0-9 Units Subcutaneous Q4H Lorretta Harp, MD   3 Units at 07/21/19 [phone]  .  insulin glargine (LANTUS) injection 20 Units  20 Units Subcutaneous QHS Sreenath, Sudheer B, MD      . morphine 2 MG/ML injection 2 mg  2 mg Intravenous Q4H PRN Lorretta Harp, MD      . nicotine (NICODERM CQ - dosed in mg/24 hours) patch 21 mg  21 mg Transdermal Daily Lorretta Harp, MD      . nitroGLYCERIN (NITROSTAT) SL tablet 0.4 mg  0.4 mg Sublingual Q5 min PRN Lorretta Harp, MD      . ondansetron [facility]) injection 4 mg  4 mg Intravenous Q8H PRN Lorretta Harp, MD      . oxyCODONE-acetaminophen (PERCOCET/ROXICET) 5-325 MG per tablet 1 tablet  1 tablet Oral Q4H PRN Lorretta Harp, MD      . pantoprazole (PROTONIX) EC tablet 40 mg  40 mg Oral Daily Lorretta Harp, MD   40 mg at 07/21/19 0840  . piperacillin-tazobactam (ZOSYN) IVPB 3.375 g  3.375 g Intravenous Alean Rinne, MD 12.5 mL/hr at 07/21/19 0513 3.375 g at 07/21/19 0513  . sodium chloride 0.9 % bolus 2,000 mL  2,000 mL Intravenous Once Lorretta Harp, MD      . vancomycin (VANCOREADY) IVPB 750 mg/150 mL  750 mg Intravenous Q12H Hallaji, Sheema M, RPH         Objective: Vital: Vitals:   07/21/19 0115 07/21/19 0121 07/21/19 0322 07/21/19 0442  BP: 125/78  114/73 109/72  Pulse: (!) 101 (!) 105 100 93  Resp: 20  18 18   Temp: 100 F (37.8 C)  98.6 F (37 C) 98.2 F (36.8 C)  TempSrc: Oral  Oral Oral  SpO2: 96% 97% 95% 95%  Weight:      Height:       I/Os: I/O last 3 completed shifts: In: 2520 [P.O.:720; I.V.:1250; IV Piggyback:550] Out: 600 [Urine:550; Blood:50]  Physical Exam:  General: Patient is in no apparent distress Lungs: Normal respiratory effort, chest expands symmetrically. GI: The abdomen is soft and nontender without mass. Genitourinary: Foley catheter draining clear yellow urine.  Right testicle exposed and appears completely viable.  No overlying necrosis.  Wound bed appears healthy.  I do not see any obvious areas of necrosis.  Left hemiscrotum without any evidence of fluctuance or crepitus.  No overlying necrosis. Ext: lower  extremities symmetric  Lab Results: Recent Labs    07/20/19 1059 07/21/19 0641  WBC 30.9* 21.2*  HGB 15.0 12.4*  HCT 41.4 35.1*   Recent Labs    07/20/19 1005 07/21/19 0641  NA 127* 133*  K 3.8 3.6  CL 94* 101  CO2 20* 23  GLUCOSE 307* 285*  BUN 11 18  CREATININE 1.10 1.60*  CALCIUM 8.3* 7.5*   No results for input(s): LABPT, INR in the last 72 hours. No results for input(s): LABURIN in the last 72 hours. Results for orders placed or performed during the hospital encounter of 07/20/19  SARS Coronavirus 2 by RT PCR (hospital order, performed in [facility] hospital lab) Nasopharyngeal Nasopharyngeal Swab     Status: None   Collection Time: 07/20/19  2:08 PM   Specimen: Nasopharyngeal Swab  Result Value Ref Range Status   SARS Coronavirus 2 NEGATIVE NEGATIVE Final    Comment: (NOTE) SARS-CoV-2 target nucleic acids are NOT DETECTED. The SARS-CoV-2 RNA is generally detectable in upper and lower respiratory specimens during the acute phase of infection. The lowest concentration of SARS-CoV-2 viral copies this assay can detect is 250 copies / mL. A negative result does not preclude SARS-CoV-2 infection and should not be used as the sole basis for treatment or other patient management decisions.  A negative result may occur with improper specimen collection / handling, submission of specimen other than nasopharyngeal swab, presence of viral mutation(s) within the areas targeted by this assay, and inadequate number of viral copies (<250 copies / mL). A negative result must be combined with clinical observations, patient history, and epidemiological information. Fact Sheet for Patients:   09/19/19 Fact Sheet for Healthcare Providers: BoilerBrush.com.cy This test is not yet approved or cleared  by the https://pope.com/ FDA and has been authorized for detection and/or diagnosis of SARS-CoV-2 by FDA under an Emergency Use  Authorization (EUA).  This EUA will remain in effect (meaning this test can be used) for the duration of the COVID-19 declaration under Section 564(b)(1) of the Act, 21 U.S.C. section 360bbb-3(b)(1), unless the authorization is terminated or revoked sooner. Performed at [facility], [address] [place], [place]   Aerobic/Anaerobic Culture (surgical/deep wound)     Status: None (Preliminary result)   Collection Time: 07/20/19  4:13 PM   Specimen: PATH Other; Wound  Result Value Ref Range Status   Specimen Description   Final    WOUND Performed at [facility], [address], [place], [place]    Special Requests   Final    NONE Performed at [facility],  [place], [place] [address]    Gram Stain   Final    FEW WBC PRESENT, PREDOMINANTLY PMN MODERATE GRAM POSITIVE COCCI MODERATE GRAM NEGATIVE RODS    Culture   Final    NO GROWTH < 24 HOURS Performed at [place] [address], [place], [place] [address]    Report Status PENDING  Incomplete  CULTURE, BLOOD (ROUTINE X 2) w Reflex to ID Panel     Status: None (Preliminary result)   Collection Time: 07/20/19  6:51 PM   Specimen: BLOOD  Result Value Ref Range Status   Specimen Description BLOOD BLOOD RIGHT HAND  Final   Special Requests   Final    BOTTLES DRAWN AEROBIC AND ANAEROBIC Blood Culture adequate volume   Culture   Final    NO GROWTH < 12 HOURS Performed at [facility], [address], [place], [place] [address]    Report Status PENDING  Incomplete  CULTURE, BLOOD (ROUTINE X 2) w Reflex to ID Panel     Status: None (Preliminary result)   Collection Time: 07/20/19  7:52 PM   Specimen: BLOOD  Result Value Ref Range Status   Specimen Description BLOOD BLOOD RIGHT HAND  Final   Special Requests   Final    BOTTLES DRAWN AEROBIC ONLY Blood Culture adequate volume   Culture   Final    NO GROWTH < 12 HOURS Performed at [facility], [address] [place], [place] [address]    Report Status PENDING  Incomplete    Studies/Results: CT PELVIS W CONTRAST  Result Date: 07/20/2019 CLINICAL DATA:  Abscess under the testicles. Patient is seen at [facility] yesterday. Abscess drain was attempted, but no fluid was obtained. Symptoms for 5 days. Scrotal swelling. EXAM: CT PELVIS WITH CONTRAST TECHNIQUE: Multidetector CT imaging of the pelvis was performed using the standard protocol following the bolus administration of intravenous contrast. CONTRAST:  176mL OMNIPAQUE IOHEXOL 300 MG/ML  SOLN COMPARISON:  05/05/2019 FINDINGS: Urinary Tract: Distal ureters are unremarkable. Bladder is distended. Visualized course of the urethra is unremarkable. Bowel: Normal appearance of the visualized large and small bowel loops. Normal appendix. Vascular/Lymphatic: Mildly enlarged inguinal lymph nodes bilaterally, LEFT greater than RIGHT. Largest lymph node measures 1.5 centimeters in short axis. There are small bilateral external iliac lymph nodes, largest measuring 1.1 centimeters on the LEFT. Reproductive: There are faint calcifications within the prostate gland. Seminal vesicles are partially calcified. There is diffuse scrotal wall swelling. Locules of gas are identified within the posterior aspect of the scrotum, measuring up to 3.7 x 3.1 centimeters. Air extends inferiorly he continues anteriorly along the MEDIAL raphe of the scrotum. No definite fluid collection identified. Other:  Anterior abdominal wall is unremarkable.  No ascites. Musculoskeletal: No acute abnormality. IMPRESSION: 1. Diffuse scrotal wall swelling and gas, consistent with Fournier's gangrene. 2. No definite fluid collection identified. 3. Bilateral inguinal and external iliac lymph nodes, likely reactive. 4. Distended urinary bladder. Critical Value/emergent results were called by telephone at the time of interpretation on 07/20/2019 at 1:00 pm to provider [facility] ,  who verbally acknowledged these results. Electronically Signed   By: Nolon Nations M.D.   On: 07/20/2019 13:01    Assessment: Fournier's gangrene  Procedure(s): Excisional debridement of scrotum  Plan: Continue vancomycin and Zosyn.  Continue his Foley catheter.  We will place him on Lovenox for DVT prophylaxis.  Recommend consideration of diabetes coordinator consultation.  I will make him n.p.o.  at midnight and the wound will be reassessed tomorrow although it looked healthy today and he is clinically improving so he may not need to be reassessed in the operating room.   Modena Slater, MD Urology 07/21/2019, 12:38 PM

## 2019-07-21 NOTE — Progress Notes (Signed)
PROGRESS NOTE    Martin Mullen  DQQ:[id number] DOB: 11-26-1980 DOA: 07/20/2019 PCP: [name], NP   Brief Narrative: HPI: Martin Mullen is a 39 y.o. male with medical history significant of hyperlipidemia, diabetes mellitus, GERD, tobacco abuse, CAD, myocardial infarction (known DES stent placement), scrotal infections (required incision and drainage), who presents with scrotal pain.  Patient states that he has been scrotal pain in the past 5 days, which is worsened today.  The pain is constant, moderate, sharp, nonradiating.  The scrotum seems to be erythematous and mildly swollen.  No injury. Patient also has fever and chills.  Denies nausea, vomiting, diarrhea, abdominal pain, symptoms of UTI.  Patient does not have chest pain, shortness breath, cough.  6/6: Patient seen and examined.  Clinically nontoxic-appearing.  Some pain but reasonably well controlled.  Remains on broad-spectrum IV antibiotics.  No fevers noted over interval.  White count decreasing.   Assessment & Plan:   Principal Problem:   Fournier's gangrene Active Problems:   HTN (hypertension)   HLD (hyperlipidemia)   Coronary artery disease   Sepsis ([place])   Diabetes mellitus without complication ([place])   Tobacco abuse   Hyponatremia   GERD (gastroesophageal reflux disease)  Sepsis due to fournier's gangrene:  Patient meets criteria for sepsis with leukocytosis, fever, tachycardia.   Lactic acid normal.   Currently hemodynamically stable.   Urology was consulted Dr. Gloriann Loan did scrotal exploration with scrotal debridement  6/5 Plan: Continue broad-spectrum antibiotics Vancomycin IV, pharmacy dosing Piperacillin tazobactam 3.375 g every 8 hours Follow blood and urine cultures Supplemental IV fluids APAP as needed fever As needed pain control Continue Foley catheter Appreciate urology follow-up  Essential hypertension: -prn Hydralazine -hold home [place].  BP controlled at this time.  Can likely restart  medication as BP allows  HLD (hyperlipidemia) -Lipitor  Coronary artery disease: s/p of non-DES. No CP -ASA, lipitro  Diabetes mellitus without complication ([place]): Most recent A1c 7.5 , poorly controled.  Patient is taking Januvia, glipizide, Metformin, Lantus at home Plan: Lantus 20 units daily Sliding scale insulin CBG before meals and at bedtime Carb modified diet  Tobacco abuse -Nicotine patch  Hyponatremia: Na corrected to 130-132. -on IV NS as above  GERD: -Protonix  DVT prophylaxis: SCDs, can likely start chemoprophylaxis within 24 hours Code Status: Full Family Communication: None today Disposition Plan:Status is: Inpatient  Remains inpatient appropriate because:Inpatient level of care appropriate due to severity of illness   Dispo: The patient is from: Home              Anticipated d/c is to: Home              Anticipated d/c date is: 3 days              Patient currently is not medically stable to d/c.         Consultants:   Urology    Procedures:   Debridement of scrotal abscess/cellulitis, 07/20/19  Antimicrobials:   Vancomycin IV, 07/20/2019-  Zosyn IV 07/20/2019-   Subjective: Patient seen and examined.  No acute distress.  Some pain in scrotal area but well controlled.  Objective: Vitals:   07/21/19 0115 07/21/19 0121 07/21/19 0322 07/21/19 0442  BP: 125/78  114/73 109/72  Pulse: (!) 101 (!) 105 100 93  Resp: 20  18 18   Temp: 100 F (37.8 C)  98.6 F (37 C) 98.2 F (36.8 C)  TempSrc: Oral  Oral Oral  SpO2: 96% 97% 95% 95%  Weight:      Height:        Intake/Output Summary (Last 24 hours) at 07/21/2019 1137 Last data filed at 07/21/2019 0000 Gross per 24 hour  Intake 2520 ml  Output 600 ml  Net 1920 ml   Filed Weights   07/20/19 0943  Weight: 113.4 kg    Examination:  General exam: Appears calm and comfortable  Respiratory system: Clear to auscultation. Respiratory effort normal. Cardiovascular system: S1 & S2  heard, RRR. No JVD, murmurs, rubs, gallops or clicks. No pedal edema. Gastrointestinal system: Abdomen is nondistended, soft and nontender. No organomegaly or masses felt. Normal bowel sounds heard. Central nervous system: Alert and oriented. No focal neurological deficits. Extremities: Symmetric 5 x 5 power. Skin: No rashes, lesions or ulcers Psychiatry: Judgement and insight appear normal. Mood & affect appropriate.     Data Reviewed: I have personally reviewed following labs and imaging studies  CBC: Recent Labs  Lab 07/20/19 1059 07/21/19 0641  WBC 30.9* 21.2*  NEUTROABS 26.8*  --   HGB 15.0 12.4*  HCT 41.4 35.1*  MCV 91.0 91.6  PLT 193 164   Basic Metabolic Panel: Recent Labs  Lab 07/20/19 1005 07/21/19 0641  NA 127* 133*  K 3.8 3.6  CL 94* 101  CO2 20* 23  GLUCOSE 307* 285*  BUN 11 18  CREATININE 1.10 1.60*  CALCIUM 8.3* 7.5*   GFR: Estimated Creatinine Clearance: 78.2 mL/min (A) (by C-G formula based on SCr of 1.6 mg/dL (H)). Liver Function Tests: Recent Labs  Lab 07/20/19 1005  AST 16  ALT 21  ALKPHOS 91  BILITOT 1.6*  PROT 7.0  ALBUMIN 3.4*   No results for input(s): LIPASE, AMYLASE in the last 168 hours. No results for input(s): AMMONIA in the last 168 hours. Coagulation Profile: No results for input(s): INR, PROTIME in the last 168 hours. Cardiac Enzymes: No results for input(s): CKTOTAL, CKMB, CKMBINDEX, TROPONINI in the last 168 hours. BNP (last 3 results) No results for input(s): PROBNP in the last 8760 hours. HbA1C: No results for input(s): HGBA1C in the last 72 hours. CBG: Recent Labs  Lab 07/20/19 1854 07/20/19 2023 07/21/19 0017 07/21/19 0444 07/21/19 0806  GLUCAP 282* 289* 360* 272* 234*   Lipid Profile: No results for input(s): CHOL, HDL, LDLCALC, TRIG, CHOLHDL, LDLDIRECT in the last 72 hours. Thyroid Function Tests: No results for input(s): TSH, T4TOTAL, FREET4, T3FREE, THYROIDAB in the last 72 hours. Anemia Panel: No  results for input(s): VITAMINB12, FOLATE, FERRITIN, TIBC, IRON, RETICCTPCT in the last 72 hours. Sepsis Labs: Recent Labs  Lab 07/20/19 1005 07/20/19 1853  PROCALCITON  --  2.64  LATICACIDVEN 1.5  --     Recent Results (from the past 240 hour(s))  SARS Coronavirus 2 by RT PCR (hospital order, performed in [facility] hospital lab) Nasopharyngeal Nasopharyngeal Swab     Status: None   Collection Time: 07/20/19  2:08 PM   Specimen: Nasopharyngeal Swab  Result Value Ref Range Status   SARS Coronavirus 2 NEGATIVE NEGATIVE Final    Comment: (NOTE) SARS-CoV-2 target nucleic acids are NOT DETECTED. The SARS-CoV-2 RNA is generally detectable in upper and lower respiratory specimens during the acute phase of infection. The lowest concentration of SARS-CoV-2 viral copies this assay can detect is 250 copies / mL. A negative result does not preclude SARS-CoV-2 infection and should not be used as the sole basis for treatment or other patient management decisions.  A negative result may occur with improper specimen collection /  handling, submission of specimen other than nasopharyngeal swab, presence of viral mutation(s) within the areas targeted by this assay, and inadequate number of viral copies (<250 copies / mL). A negative result must be combined with clinical observations, patient history, and epidemiological information. Fact Sheet for Patients:   BoilerBrush.com.cy Fact Sheet for Healthcare Providers: https://pope.com/ This test is not yet approved or cleared  by the [place] FDA and has been authorized for detection and/or diagnosis of SARS-CoV-2 by FDA under an Emergency Use Authorization (EUA).  This EUA will remain in effect (meaning this test can be used) for the duration of the COVID-19 declaration under Section 564(b)(1) of the Act, 21 U.S.C. section 360bbb-3(b)(1), unless the authorization is terminated or revoked  sooner. Performed at [facility], [address] [place], [place] [address]   Aerobic/Anaerobic Culture (surgical/deep wound)     Status: None (Preliminary result)   Collection Time: 07/20/19  4:13 PM   Specimen: PATH Other; Wound  Result Value Ref Range Status   Specimen Description   Final    WOUND Performed at [facility], [address], [place], [place] [address]    Special Requests   Final    NONE Performed at [facility], [address] [place], [place] [address]    Gram Stain   Final    FEW WBC PRESENT, PREDOMINANTLY PMN MODERATE GRAM POSITIVE COCCI MODERATE GRAM NEGATIVE RODS    Culture   Final    NO GROWTH < 24 HOURS Performed at [facility], [address], [place], [place] [address]    Report Status PENDING  Incomplete  CULTURE, BLOOD (ROUTINE X 2) w Reflex to ID Panel     Status: None (Preliminary result)   Collection Time: 07/20/19  6:51 PM   Specimen: BLOOD  Result Value Ref Range Status   Specimen Description BLOOD BLOOD RIGHT HAND  Final   Special Requests   Final    BOTTLES DRAWN AEROBIC AND ANAEROBIC Blood Culture adequate volume   Culture   Final    NO GROWTH < 12 HOURS Performed at [facility], [address] [place], [place] [address]    Report Status PENDING  Incomplete  CULTURE, BLOOD (ROUTINE X 2) w Reflex to ID Panel     Status: None (Preliminary result)   Collection Time: 07/20/19  7:52 PM   Specimen: BLOOD  Result Value Ref Range Status   Specimen Description BLOOD BLOOD RIGHT HAND  Final   Special Requests   Final    BOTTLES DRAWN AEROBIC ONLY Blood Culture adequate volume   Culture   Final    NO GROWTH < 12 HOURS Performed at [facility], [address], [place], [place] [address]    Report Status PENDING  Incomplete         Radiology Studies: CT PELVIS W CONTRAST  Result Date: 07/20/2019 CLINICAL DATA:  Abscess under the testicles. Patient is seen at [facility] yesterday.  Abscess drain was attempted, but no fluid was obtained. Symptoms for 5 days. Scrotal swelling. EXAM: CT PELVIS WITH CONTRAST TECHNIQUE: Multidetector CT imaging of the pelvis was performed using the standard protocol following the bolus administration of intravenous contrast. CONTRAST:  OMNIPAQUE IOHEXOL 300 MG/ML  SOLN COMPARISON:  05/05/2019 FINDINGS: Urinary Tract: Distal ureters are unremarkable. Bladder is distended. Visualized course of the urethra is unremarkable. Bowel: Normal appearance of the visualized large and small bowel loops. Normal appendix. Vascular/Lymphatic: Mildly enlarged inguinal lymph nodes bilaterally,  LEFT greater than RIGHT. Largest lymph node measures 1.5 centimeters in short axis. There are small bilateral external iliac lymph nodes, largest measuring 1.1 centimeters on the LEFT. Reproductive: There are faint calcifications within the prostate gland. Seminal vesicles are partially calcified. There is diffuse scrotal wall swelling. Locules of gas are identified within the posterior aspect of the scrotum, measuring up to 3.7 x 3.1 centimeters. Air extends inferiorly he continues anteriorly along the MEDIAL raphe of the scrotum. No definite fluid collection identified. Other:  Anterior abdominal wall is unremarkable.  No ascites. Musculoskeletal: No acute abnormality. IMPRESSION: 1. Diffuse scrotal wall swelling and gas, consistent with Fournier's gangrene. 2. No definite fluid collection identified. 3. Bilateral inguinal and external iliac lymph nodes, likely reactive. 4. Distended urinary bladder. Critical Value/emergent results were called by telephone at the time of interpretation on 07/20/2019 at 1:00 pm to provider [facility] , who verbally acknowledged these results. Electronically Signed   By: Norva Pavlov M.D.   On: 07/20/2019 13:01        Scheduled Meds:  aspirin EC  81 mg Oral Daily   atorvastatin  40 mg Oral Daily   Chlorhexidine Gluconate Cloth  6  each Topical Daily    HYDROmorphone (DILAUDID) injection  1 mg Intravenous Once   insulin aspart  0-9 Units Subcutaneous Q4H   insulin glargine  16 Units Subcutaneous QHS   nicotine  21 mg Transdermal Daily   pantoprazole  40 mg Oral Daily   Continuous Infusions:  sodium chloride 125 mL/hr at 07/21/19 0843   sodium chloride 250 mL (07/21/19 0511)   piperacillin-tazobactam (ZOSYN)  IV 3.375 g (07/21/19 0513)   sodium chloride     vancomycin 750 mg (07/21/19 0846)     LOS: 1 day    Time spent: 35 minutes    Tresa Moore, MD [facility] Pager [phone]  If 7PM-7AM, please contact night-coverage 07/21/2019, 11:37 AM

## 2019-07-21 NOTE — Progress Notes (Signed)
Pharmacy Antibiotic Note  Martin Mullen is a 39 y.o. 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  Pharmacy has been consulted for Vanc, Zosyn dosing.  Plan: Continue Zosyn 3.375g IV q8h (4 hour infusion).   Due to increase in Scr from 1.10>> 1.6, will adjust vancomycin dose to Vancomycin 750 mg IV q12 hours.   Height: 5\' 10"  (177.8 cm) Weight: 113.4 kg (250 lb) IBW/kg (Calculated) : 73  Temp (24hrs), Avg:98.6 F (37 C), Min:97.6 F (36.4 C), Max:100 F (37.8 C)  Recent Labs  Lab 07/20/19 1005 07/20/19 1059 07/21/19 0641  WBC  --  30.9* 21.2*  CREATININE 1.10  --  1.60*  LATICACIDVEN 1.5  --   --     Estimated Creatinine Clearance: 78.2 mL/min (A) (by C-G formula based on SCr of 1.6 mg/dL (H)).    No Known Allergies  Antimicrobials this admission: 6/5 Zosyn >>  6/5 Vancomycin>>  >>   Microbiology results:  BCx: NG TD  Wound Cx: Mod GPC, Mod GNR  Thank you for allowing pharmacy to be a part of this patient's care.  09/20/19, PharmD, BCPS Clinical Pharmacist 07/21/2019 11:46 AM

## 2019-07-22 ENCOUNTER — Telehealth: Payer: Self-pay | Admitting: Internal Medicine

## 2019-07-22 LAB — CBC WITH DIFFERENTIAL/PLATELET
Abs Immature Granulocytes: 0.15 10*3/uL — ABNORMAL HIGH (ref 0.00–0.07)
Basophils Absolute: 0.1 10*3/uL (ref 0.0–0.1)
Basophils Relative: 0 %
Eosinophils Absolute: 0.3 10*3/uL (ref 0.0–0.5)
Eosinophils Relative: 1 %
HCT: 33.3 % — ABNORMAL LOW (ref 39.0–52.0)
Hemoglobin: 11.5 g/dL — ABNORMAL LOW (ref 13.0–17.0)
Immature Granulocytes: 1 %
Lymphocytes Relative: 12 %
Lymphs Abs: 2.3 10*3/uL (ref 0.7–4.0)
MCH: 32.8 pg (ref 26.0–34.0)
MCHC: 34.5 g/dL (ref 30.0–36.0)
MCV: 94.9 fL (ref 80.0–100.0)
Monocytes Absolute: 1.4 10*3/uL — ABNORMAL HIGH (ref 0.1–1.0)
Monocytes Relative: 7 %
Neutro Abs: 14.9 10*3/uL — ABNORMAL HIGH (ref 1.7–7.7)
Neutrophils Relative %: 79 %
Platelets: 202 10*3/uL (ref 150–400)
RBC: 3.51 MIL/uL — ABNORMAL LOW (ref 4.22–5.81)
RDW: 13.1 % (ref 11.5–15.5)
WBC: 19 10*3/uL — ABNORMAL HIGH (ref 4.0–10.5)
nRBC: 0 % (ref 0.0–0.2)

## 2019-07-22 LAB — BASIC METABOLIC PANEL
Anion gap: 8 (ref 5–15)
BUN: 22 mg/dL — ABNORMAL HIGH (ref 6–20)
CO2: 22 mmol/L (ref 22–32)
Calcium: 7.5 mg/dL — ABNORMAL LOW (ref 8.9–10.3)
Chloride: 106 mmol/L (ref 98–111)
Creatinine, Ser: 1.93 mg/dL — ABNORMAL HIGH (ref 0.61–1.24)
GFR calc Af Amer: 49 mL/min — ABNORMAL LOW (ref >60)
GFR calc non Af Amer: 43 mL/min — ABNORMAL LOW (ref >60)
Glucose, Bld: 251 mg/dL — ABNORMAL HIGH (ref 70–99)
Potassium: 3.6 mmol/L (ref 3.5–5.1)
Sodium: 136 mmol/L (ref 135–145)

## 2019-07-22 LAB — CREATININE, SERUM
Creatinine, Ser: 1.97 mg/dL — ABNORMAL HIGH (ref 0.61–1.24)
GFR calc Af Amer: 48 mL/min — ABNORMAL LOW (ref >60)
GFR calc non Af Amer: 42 mL/min — ABNORMAL LOW (ref >60)

## 2019-07-22 LAB — GLUCOSE, CAPILLARY
Glucose-Capillary: 211 mg/dL — ABNORMAL HIGH (ref 70–99)
Glucose-Capillary: 242 mg/dL — ABNORMAL HIGH (ref 70–99)
Glucose-Capillary: 252 mg/dL — ABNORMAL HIGH (ref 70–99)
Glucose-Capillary: 200 mg/dL — ABNORMAL HIGH (ref 70–99)

## 2019-07-22 MED ORDER — INSULIN GLARGINE 100 UNIT/ML ~~LOC~~ SOLN
23.0000 [IU] | Freq: Every day | SUBCUTANEOUS | Status: DC
  Administered 2019-07-22 – 2019-07-23 (×2): 23 [IU] via SUBCUTANEOUS
  Filled 2019-07-22 (×3): qty 0.23

## 2019-07-22 MED ORDER — LINEZOLID 600 MG/300ML IV SOLN
600.0000 mg | Freq: Two times a day (BID) | INTRAVENOUS | Status: DC
  Administered 2019-07-22 – 2019-07-25 (×8): 600 mg via INTRAVENOUS
  Filled 2019-07-22 (×10): qty 300

## 2019-07-22 MED ORDER — INSULIN ASPART 100 UNIT/ML ~~LOC~~ SOLN
8.0000 [IU] | Freq: Three times a day (TID) | SUBCUTANEOUS | Status: DC
  Administered 2019-07-22 – 2019-07-24 (×6): 8 [IU] via SUBCUTANEOUS
  Filled 2019-07-22 (×6): qty 1

## 2019-07-22 NOTE — Progress Notes (Signed)
Inpatient Diabetes Program Recommendations  AACE/ADA: New Consensus Statement on Inpatient Glycemic Control (2015)  Target Ranges:  Prepandial:   less than 140 mg/dL      Peak postprandial:   less than 180 mg/dL (1-2 hours)      Critically ill patients:  140 - 180 mg/dL   Results for Martin Mullen, Martin Mullen (MRN [id number]) as of 07/22/2019 08:17  Ref. Range 07/21/2019 00:17 07/21/2019 04:44 07/21/2019 08:06 07/21/2019 11:29 07/21/2019 17:18 07/21/2019 22:56  Glucose-Capillary Latest Ref Range: 70 - 99 mg/dL 924 (H)  9 units NOVOLOG  272 (H)  5 units NOVOLOG  234 (H)  3 units NOVOLOG  303 (H)  7 units NOVOLOG  302 (H)  16 units NOVOLOG  273 (H)  3 units NOVOLOG +  20 units LANTUS   Results for Martin Mullen, Martin Mullen (MRN [id number]) as of 07/22/2019 08:17  Ref. Range 07/22/2019 07:57  Glucose-Capillary Latest Ref Range: 70 - 99 mg/dL 229 (H)   Results for Martin Mullen, Martin Mullen (MRN [id number]) as of 07/22/2019 08:17  Ref. Range 03/05/2019 14:33  Hemoglobin A1C Latest Ref Range: 4.0 - 5.6 % 7.5 (A)    Admit with: Sepsis due to fournier's gangrene  History: DM, Scrotal infections  Home DM Meds: Lantus 23 units Daily       Glipizide 10 mg BID       Metformin 1000 mg BID       Januvia 100 mg Daily  Current Orders: Lantus 20 units QHS      Novolog Moderate Correction Scale/ SSI (0-15 units) TID AC + HS      Novolog 5 units TID with meals    Underwent I&D 06/05    MD- Note AM CBG 211 this AM.  CBGs quite elevated yesterday afternoon.  Please consider the following while home oral DM meds are on hold:  1. Increase Lantus to 23 units QHS (full home dose)  2. Increase Novolog Meal Coverage to: Novolog 8 units TID with meals    --Will follow patient during hospitalization--  Ambrose Finland RN, MSN, CDE Diabetes Coordinator Inpatient Glycemic Control Team Team Pager: [phone] (8a-5p)

## 2019-07-22 NOTE — Telephone Encounter (Signed)
Patient called requesting to speak with the nurse He stated that he is trying to get short term disability and wanted to see if you are able to help him with this.   Does this need to go to [facility] instead?

## 2019-07-22 NOTE — Progress Notes (Signed)
PROGRESS NOTE    Martin Mullen  ZOX:[id number] DOB: 08-22-1980 DOA: 07/20/2019 PCP: [name], NP   Brief Narrative: HPI: Martin Mullen is a 39 y.o. male with medical history significant of hyperlipidemia, diabetes mellitus, GERD, tobacco abuse, CAD, myocardial infarction (known DES stent placement), scrotal infections (required incision and drainage), who presents with scrotal pain.  Patient states that he has been scrotal pain in the past 5 days, which is worsened today.  The pain is constant, moderate, sharp, nonradiating.  The scrotum seems to be erythematous and mildly swollen.  No injury. Patient also has fever and chills.  Denies nausea, vomiting, diarrhea, abdominal pain, symptoms of UTI.  Patient does not have chest pain, shortness breath, cough.  6/6: Patient seen and examined.  Clinically nontoxic-appearing.  Some pain but reasonably well controlled.  Remains on broad-spectrum IV antibiotics.  No fevers noted over interval.  White count decreasing.  6/7: Patient seen and examined.  Seen by urology this morning.  No plan for return to the operating room at this time.  Wound is healing well.  Remains on broad-spectrum IV antibiotics.  Kidney function deteriorating over interval.  No fevers noted.  White count decreasing.   Assessment & Plan:   Principal Problem:   Fournier's gangrene Active Problems:   HTN (hypertension)   HLD (hyperlipidemia)   Coronary artery disease   Sepsis ([place])   Diabetes mellitus without complication ([place])   Tobacco abuse   Hyponatremia   GERD (gastroesophageal reflux disease)  Sepsis due to fournier's gangrene:  Patient meets criteria for sepsis with leukocytosis, fever, tachycardia.   Lactic acid normal.   Currently hemodynamically stable.   Urology was consulted Dr. Gloriann Loan did scrotal exploration with scrotal debridement  6/5 Possible plan to return to the operating room during this admission Currently urology is electing to proceed with  diligent wound care and IV antibiotics Plan: Continue broad-spectrum antibiotics Discussed with pharmacy.  Will change antibiotic regimen to linezolid and Zosyn to continue broad-spectrum coverage without increased risk of acute tubular necrosis Linezolid IV, pharmacy dosing Zosyn IV, pharmacy dosing Follow blood and urine cultures Supplemental IV fluids APAP as needed fever As needed pain control Continue Foley catheter Appreciate urology follow-up Daily wound care  Essential hypertension: -prn Hydralazine -hold home Cozar.   -We will likely start a alternative agent given AKI if blood pressure dictates  Acute kidney injury Suspect acute tubular necrosis secondary to broad-spectrum antibiotics Postobstructive uropathy and prerenal azotemia less likely Patient has been on continuous IV fluids He has Foley catheter in place and has adequate urine output Antibiotic regimen changed after discussion with ID pharmacy Daily renal function  HLD (hyperlipidemia) -Lipitor  Coronary artery disease: s/p of non-DES. No CP -ASA, lipitro  Diabetes mellitus without complication ([place]): Most recent A1c 7.5 , poorly controled.  Patient is taking Januvia, glipizide, Metformin, Lantus at home Plan: Lantus 23 units daily NovoLog 8 units 3 times daily with meals Sliding scale insulin CBG before meals and at bedtime Carb modified diet DM coordinator consult  Tobacco abuse -Nicotine patch  Hyponatremia: Na corrected to 130-132. -on IV NS as above  GERD: -Protonix  DVT prophylaxis: Lovenox Code Status: Full Family Communication: Significant other at bedside Disposition Plan:Status is: Inpatient  Remains inpatient appropriate because:Inpatient level of care appropriate due to severity of illness   Dispo: The patient is from: Home              Anticipated d/c is to: Home  Anticipated d/c date is: 3 days              Patient currently is not medically stable to  d/c.   Consultants:   Urology    Procedures:   Debridement of scrotal abscess/cellulitis, 07/20/19  Antimicrobials:   Vancomycin IV, 07/20/2019-07/22/19  Zosyn IV 07/20/2019-  Linezolid, 07/22/19   Subjective: Patient seen and examined.  No acute distress.  Some pain in scrotal area but well controlled.  Objective: Vitals:   07/21/19 1432 07/21/19 1946 07/22/19 0518 07/22/19 1150  BP: (!) 141/91 129/86 (!) 144/90 126/79  Pulse: (!) 109 95 95 88  Resp: 19 20 16 13   Temp: 98.3 F (36.8 C) 97.7 F (36.5 C) 98.2 F (36.8 C) 98.7 F (37.1 C)  TempSrc: Oral Oral Oral Oral  SpO2: 99% 97% 97% 98%  Weight:      Height:        Intake/Output Summary (Last 24 hours) at 07/22/2019 1427 Last data filed at 07/22/2019 1353 Gross per 24 hour  Intake 4221.04 ml  Output 3075 ml  Net 1146.04 ml   Filed Weights   07/20/19 0943  Weight: 113.4 kg    Examination:  General exam: Appears calm and comfortable  Respiratory system: Clear to auscultation. Respiratory effort normal. Cardiovascular system: S1 & S2 heard, RRR. No JVD, murmurs, rubs, gallops or clicks. No pedal edema. Gastrointestinal system: Abdomen is nondistended, soft and nontender. No organomegaly or masses felt. Normal bowel sounds heard. Central nervous system: Alert and oriented. No focal neurological deficits. Extremities: Symmetric 5 x 5 power. Skin: No rashes, lesions or ulcers Psychiatry: Judgement and insight appear normal. Mood & affect appropriate.     Data Reviewed: I have personally reviewed following labs and imaging studies  CBC: Recent Labs  Lab 07/20/19 1059 07/21/19 0641 07/22/19 0500  WBC 30.9* 21.2* 19.0*  NEUTROABS 26.8*  --  14.9*  HGB 15.0 12.4* 11.5*  HCT 41.4 35.1* 33.3*  MCV 91.0 91.6 94.9  PLT 193 164 202   Basic Metabolic Panel: Recent Labs  Lab 07/20/19 1005 07/21/19 0641 07/22/19 0500 07/22/19 0535  NA 127* 133* 136  --   K 3.8 3.6 3.6  --   CL 94* 101 106  --   CO2 20* 23  22  --   GLUCOSE 307* 285* 251*  --   BUN 11 18 22*  --   CREATININE 1.10 1.60* 1.93* 1.97*  CALCIUM 8.3* 7.5* 7.5*  --    GFR: Estimated Creatinine Clearance: 63.5 mL/min (A) (by C-G formula based on SCr of 1.97 mg/dL (H)). Liver Function Tests: Recent Labs  Lab 07/20/19 1005  AST 16  ALT 21  ALKPHOS 91  BILITOT 1.6*  PROT 7.0  ALBUMIN 3.4*   No results for input(s): LIPASE, AMYLASE in the last 168 hours. No results for input(s): AMMONIA in the last 168 hours. Coagulation Profile: No results for input(s): INR, PROTIME in the last 168 hours. Cardiac Enzymes: No results for input(s): CKTOTAL, CKMB, CKMBINDEX, TROPONINI in the last 168 hours. BNP (last 3 results) No results for input(s): PROBNP in the last 8760 hours. HbA1C: No results for input(s): HGBA1C in the last 72 hours. CBG: Recent Labs  Lab 07/21/19 1129 07/21/19 1718 07/21/19 2256 07/22/19 0757 07/22/19 1148  GLUCAP 303* 302* 273* 211* 242*   Lipid Profile: No results for input(s): CHOL, HDL, LDLCALC, TRIG, CHOLHDL, LDLDIRECT in the last 72 hours. Thyroid Function Tests: No results for input(s): TSH, T4TOTAL, FREET4, T3FREE, THYROIDAB  in the last 72 hours. Anemia Panel: No results for input(s): VITAMINB12, FOLATE, FERRITIN, TIBC, IRON, RETICCTPCT in the last 72 hours. Sepsis Labs: Recent Labs  Lab 07/20/19 1005 07/20/19 1853  PROCALCITON  --  2.64  LATICACIDVEN 1.5  --     Recent Results (from the past 240 hour(s))  SARS Coronavirus 2 by RT PCR (hospital order, performed in [facility] hospital lab) Nasopharyngeal Nasopharyngeal Swab     Status: None   Collection Time: 07/20/19  2:08 PM   Specimen: Nasopharyngeal Swab  Result Value Ref Range Status   SARS Coronavirus 2 NEGATIVE NEGATIVE Final    Comment: (NOTE) SARS-CoV-2 target nucleic acids are NOT DETECTED. The SARS-CoV-2 RNA is generally detectable in upper and lower respiratory specimens during the acute phase of infection. The  lowest concentration of SARS-CoV-2 viral copies this assay can detect is 250 copies / mL. A negative result does not preclude SARS-CoV-2 infection and should not be used as the sole basis for treatment or other patient management decisions.  A negative result may occur with improper specimen collection / handling, submission of specimen other than nasopharyngeal swab, presence of viral mutation(s) within the areas targeted by this assay, and inadequate number of viral copies (<250 copies / mL). A negative result must be combined with clinical observations, patient history, and epidemiological information. Fact Sheet for Patients:   BoilerBrush.com.cy Fact Sheet for Healthcare Providers: https://pope.com/ This test is not yet approved or cleared  by the [place] FDA and has been authorized for detection and/or diagnosis of SARS-CoV-2 by FDA under an Emergency Use Authorization (EUA).  This EUA will remain in effect (meaning this test can be used) for the duration of the COVID-19 declaration under Section 564(b)(1) of the Act, 21 U.S.C. section 360bbb-3(b)(1), unless the authorization is terminated or revoked sooner. Performed at [facility], [address] [place], [place] [address]   Aerobic/Anaerobic Culture (surgical/deep wound)     Status: None (Preliminary result)   Collection Time: 07/20/19  4:13 PM   Specimen: PATH Other; Wound  Result Value Ref Range Status   Specimen Description   Final    WOUND Performed at [facility], [address] [place], [place] [address]    Special Requests   Final    NONE Performed at [facility], [address] [place], [place] [address]    Gram Stain   Final    FEW WBC PRESENT, PREDOMINANTLY PMN MODERATE GRAM POSITIVE COCCI MODERATE GRAM NEGATIVE RODS    Culture   Final    HOLDING FOR POSSIBLE ANAEROBE Performed at [facility], [address] [place], [place] [address]    Report Status PENDING  Incomplete  CULTURE, BLOOD (ROUTINE X 2) w Reflex to ID Panel     Status: None (Preliminary result)   Collection Time: 07/20/19  6:51 PM   Specimen: BLOOD  Result Value Ref Range Status   Specimen Description BLOOD BLOOD RIGHT HAND  Final   Special Requests   Final    BOTTLES DRAWN AEROBIC AND ANAEROBIC Blood Culture adequate volume   Culture   Final    NO GROWTH 2 DAYS Performed at [facility], [address] [place], [place] [address]    Report Status PENDING  Incomplete  CULTURE, BLOOD (ROUTINE X 2) w Reflex to ID Panel     Status: None (Preliminary result)   Collection Time: 07/20/19  7:52 PM   Specimen: BLOOD  Result Value Ref  Range Status   Specimen Description BLOOD BLOOD RIGHT HAND  Final   Special Requests   Final    BOTTLES DRAWN AEROBIC ONLY Blood Culture adequate volume   Culture   Final    NO GROWTH 2 DAYS Performed at [facility], [address] [place], [place] [address]    Report Status PENDING  Incomplete         Radiology Studies: No results found.      Scheduled Meds:  aspirin EC  81 mg Oral Daily   atorvastatin  40 mg Oral Daily   Chlorhexidine Gluconate Cloth  6 each Topical Daily   enoxaparin (LOVENOX) injection  40 mg Subcutaneous Daily   insulin aspart  0-15 Units Subcutaneous TID WC   insulin aspart  0-5 Units Subcutaneous QHS   insulin aspart  8 Units Subcutaneous TID WC   insulin glargine  23 Units Subcutaneous QHS   nicotine  21 mg Transdermal Daily   pantoprazole  40 mg Oral Daily   sodium chloride flush  10-40 mL Intracatheter Q12H   Continuous Infusions:  sodium chloride 125 mL/hr at 07/22/19 1241   sodium chloride 10 mL/hr at 07/22/19 0412   linezolid (ZYVOX) IV 600 mg (07/22/19 1302)   piperacillin-tazobactam (ZOSYN)  IV 3.375 g (07/22/19 1304)   sodium chloride       LOS: 2 days    Time spent: 35 minutes    Tresa Moore, MD [facility] Pager [phone]  If 7PM-7AM, please contact night-coverage 07/22/2019, 2:27 PM

## 2019-07-22 NOTE — Progress Notes (Signed)
Pharmacy Antibiotic Note  Martin Mullen is a 39 y.o. 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  Pharmacy has been consulted for Vanc, Zosyn dosing.  Plan: Continue Zosyn 3.375g IV q8h (4 hour infusion).   Increase in SCr from 1.10> 1.6>1.97 6/6 Vancomycin dose adjusted from 750 mg IV q8h to q12h  Will continue Vancomycin 750 mg IV q12 hours at this time  Will need to closely monitor renal function   Height: 5\' 10"  (177.8 cm) Weight: 113.4 kg (250 lb) IBW/kg (Calculated) : 73  Temp (24hrs), Avg:98.1 F (36.7 C), Min:97.7 F (36.5 C), Max:98.3 F (36.8 C)  Recent Labs  Lab 07/20/19 1005 07/20/19 1059 07/21/19 0641 07/22/19 0500 07/22/19 0535  WBC  --  30.9* 21.2* 19.0*  --   CREATININE 1.10  --  1.60* 1.93* 1.97*  LATICACIDVEN 1.5  --   --   --   --     Estimated Creatinine Clearance: 63.5 mL/min (A) (by C-G formula based on SCr of 1.97 mg/dL (H)).    No Known Allergies  Antimicrobials this admission: 6/5 Zosyn >>  6/5 Vancomycin>>  >>   Microbiology results:  BCx: NG TD  Wound Cx: Mod GPC, Mod GNR; pending   Thank you for allowing pharmacy to be a part of this patient's care.  09/21/19, PharmD, BCPS Clinical Pharmacist 07/22/2019 9:49 AM

## 2019-07-22 NOTE — Progress Notes (Addendum)
° °  Subjective Afebrile and HDS overnight Pain controlled  Physical Exam: BP (!) 144/90 (BP Location: Right Arm)    Pulse 95    Temp 98.2 F (36.8 C) (Oral)    Resp 16    Ht 5\' 10"  (1.778 m)    Wt 113.4 kg    SpO2 97%    BMI 35.87 kg/m    Constitutional:  Alert and oriented, No acute distress. Respiratory: Normal respiratory effort, no increased work of breathing. GI: Abdomen is soft, non-tender, non-distended GU: Right hemiscrotum resected, wound edges pink and healthy-appearing, no active bleeding, no area of dusky or necrotic tissue, no spreading erythema, right testicle is exposed, pink, and viable appearing Drains: Foley with clear yellow urine  Laboratory Data: Pending  Assessment & Plan:   39 year old male with diabetes POD#2 from right hemiscrotum resection for Fournier's gangrene.  Wound appears clean with no evidence of residual necrotic tissue this morning.  Okay for diet today.  Maintain foley Continue broad-spectrum antibiotics Continue tight diabetes control Follow-up cultures Urology will continue to follow closely with recommendations regarding wound care and follow-up  24, MD

## 2019-07-23 DIAGNOSIS — E119 Type 2 diabetes mellitus without complications: Secondary | ICD-10-CM | POA: Diagnosis not present

## 2019-07-23 DIAGNOSIS — N493 Fournier gangrene: Secondary | ICD-10-CM | POA: Diagnosis not present

## 2019-07-23 DIAGNOSIS — E1169 Type 2 diabetes mellitus with other specified complication: Secondary | ICD-10-CM | POA: Diagnosis not present

## 2019-07-23 DIAGNOSIS — A419 Sepsis, unspecified organism: Secondary | ICD-10-CM | POA: Diagnosis not present

## 2019-07-23 DIAGNOSIS — I129 Hypertensive chronic kidney disease with stage 1 through stage 4 chronic kidney disease, or unspecified chronic kidney disease: Secondary | ICD-10-CM | POA: Diagnosis not present

## 2019-07-23 DIAGNOSIS — S3130XA Unspecified open wound of scrotum and testes, initial encounter: Secondary | ICD-10-CM | POA: Diagnosis not present

## 2019-07-23 DIAGNOSIS — N1831 Chronic kidney disease, stage 3a: Secondary | ICD-10-CM | POA: Diagnosis not present

## 2019-07-23 DIAGNOSIS — E1122 Type 2 diabetes mellitus with diabetic chronic kidney disease: Secondary | ICD-10-CM | POA: Diagnosis not present

## 2019-07-23 DIAGNOSIS — I10 Essential (primary) hypertension: Secondary | ICD-10-CM | POA: Diagnosis not present

## 2019-07-23 LAB — CBC WITH DIFFERENTIAL/PLATELET
Abs Immature Granulocytes: 0.06 10*3/uL (ref 0.00–0.07)
Basophils Absolute: 0.1 10*3/uL (ref 0.0–0.1)
Basophils Relative: 1 %
Eosinophils Absolute: 0.2 10*3/uL (ref 0.0–0.5)
Eosinophils Relative: 1 %
HCT: 32.4 % — ABNORMAL LOW (ref 39.0–52.0)
Hemoglobin: 11.2 g/dL — ABNORMAL LOW (ref 13.0–17.0)
Immature Granulocytes: 0 %
Lymphocytes Relative: 14 %
Lymphs Abs: 2.2 10*3/uL (ref 0.7–4.0)
MCH: 32.7 pg (ref 26.0–34.0)
MCHC: 34.6 g/dL (ref 30.0–36.0)
MCV: 94.7 fL (ref 80.0–100.0)
Monocytes Absolute: 1.3 10*3/uL — ABNORMAL HIGH (ref 0.1–1.0)
Monocytes Relative: 8 %
Neutro Abs: 12 10*3/uL — ABNORMAL HIGH (ref 1.7–7.7)
Neutrophils Relative %: 76 %
Platelets: 240 10*3/uL (ref 150–400)
RBC: 3.42 MIL/uL — ABNORMAL LOW (ref 4.22–5.81)
RDW: 13.2 % (ref 11.5–15.5)
WBC: 15.7 10*3/uL — ABNORMAL HIGH (ref 4.0–10.5)
nRBC: 0 % (ref 0.0–0.2)

## 2019-07-23 LAB — GLUCOSE, CAPILLARY
Glucose-Capillary: 150 mg/dL — ABNORMAL HIGH (ref 70–99)
Glucose-Capillary: 209 mg/dL — ABNORMAL HIGH (ref 70–99)
Glucose-Capillary: 125 mg/dL — ABNORMAL HIGH (ref 70–99)
Glucose-Capillary: 225 mg/dL — ABNORMAL HIGH (ref 70–99)

## 2019-07-23 LAB — BASIC METABOLIC PANEL
Anion gap: 6 (ref 5–15)
BUN: 17 mg/dL (ref 6–20)
CO2: 23 mmol/L (ref 22–32)
Calcium: 7.4 mg/dL — ABNORMAL LOW (ref 8.9–10.3)
Chloride: 113 mmol/L — ABNORMAL HIGH (ref 98–111)
Creatinine, Ser: 1.69 mg/dL — ABNORMAL HIGH (ref 0.61–1.24)
GFR calc Af Amer: 58 mL/min — ABNORMAL LOW (ref >60)
GFR calc non Af Amer: 50 mL/min — ABNORMAL LOW (ref >60)
Glucose, Bld: 163 mg/dL — ABNORMAL HIGH (ref 70–99)
Potassium: 3.6 mmol/L (ref 3.5–5.1)
Sodium: 142 mmol/L (ref 135–145)

## 2019-07-23 LAB — SURGICAL PATHOLOGY

## 2019-07-23 MED ORDER — AMLODIPINE BESYLATE 5 MG PO TABS
5.0000 mg | ORAL_TABLET | Freq: Every day | ORAL | Status: DC
  Administered 2019-07-23 – 2019-07-26 (×3): 5 mg via ORAL
  Filled 2019-07-23 (×3): qty 1

## 2019-07-23 NOTE — Anesthesia Postprocedure Evaluation (Signed)
Anesthesia Post Note  Patient: Martin Mullen  Procedure(s) Performed: INCISION AND DRAINAGE ABSCESS (N/A Scrotum)  Patient location during evaluation: PACU Anesthesia Type: General Level of consciousness: awake and alert Pain management: pain level controlled Vital Signs Assessment: post-procedure vital signs reviewed and stable Respiratory status: spontaneous breathing, nonlabored ventilation and respiratory function stable Cardiovascular status: blood pressure returned to baseline and stable Postop Assessment: no apparent nausea or vomiting Anesthetic complications: no     Last Vitals:  Vitals:   07/23/19 1550 07/23/19 1948  BP: (!) 148/88 113/90  Pulse:  86  Resp:    Temp:  36.7 C  SpO2:  99%    Last Pain:  Vitals:   07/23/19 2200  TempSrc:   PainSc: 9                  Aurelio Brash Sharalyn Lomba

## 2019-07-23 NOTE — Telephone Encounter (Signed)
Pt states that the STD is for a Subdermal Abscess - pt states they he had part of his scrotum. Treated for infection, fever is down. Pt is waiting on them to close the surgical opening - they may have to refer him to a plastic surgeon to have the surgical wound closed.   Pt is currently admitted at [facility] - Rm 217, Admitted on 07/20/19 Pt was seen at 07/18/19 [facility] initially, tried to lance in the ED at [facility], patient left there and went to work the next day. Pt started running a fever 102.5 and left work and went to [facility]  Discharge date TBD  Forms should be coming from [employer]

## 2019-07-23 NOTE — Progress Notes (Signed)
PROGRESS NOTE    Martin Mullen  CHY:[id number] DOB: Jan 06, 1981 DOA: 07/20/2019 PCP: [name], NP   Brief Narrative: HPI: Martin Mullen is a 39 y.o. male with medical history significant of hyperlipidemia, diabetes mellitus, GERD, tobacco abuse, CAD, myocardial infarction (known DES stent placement), scrotal infections (required incision and drainage), who presents with scrotal pain.  Patient states that he has been scrotal pain in the past 5 days, which is worsened today.  The pain is constant, moderate, sharp, nonradiating.  The scrotum seems to be erythematous and mildly swollen.  No injury. Patient also has fever and chills.  Denies nausea, vomiting, diarrhea, abdominal pain, symptoms of UTI.  Patient does not have chest pain, shortness breath, cough.  6/6: Patient seen and examined.  Clinically nontoxic-appearing.  Some pain but reasonably well controlled.  Remains on broad-spectrum IV antibiotics.  No fevers noted over interval.  White count decreasing.  6/7: Patient seen and examined.  Seen by urology this morning.  No plan for return to the operating room at this time.  Wound is healing well.  Remains on broad-spectrum IV antibiotics.  Kidney function deteriorating over interval.  No fevers noted.  White count decreasing.  6/8: Patient seen and examined.  Bedside dressing change by urology this morning.  Patient remains on broad-spectrum IV antibiotics.  No other complaints.  Pain well controlled.   Assessment & Plan:   Principal Problem:   Fournier's gangrene Active Problems:   HTN (hypertension)   HLD (hyperlipidemia)   Coronary artery disease   Sepsis ([place])   Diabetes mellitus without complication ([place])   Tobacco abuse   Hyponatremia   GERD (gastroesophageal reflux disease)  Sepsis due to fournier's gangrene:  Patient meets criteria for sepsis with leukocytosis, fever, tachycardia.   Lactic acid normal.   Currently hemodynamically stable.   Urology was  consulted Dr. Gloriann Loan did scrotal exploration with scrotal debridement  6/5 Possible plan to return to the operating room during this admission Currently urology is electing to proceed with diligent wound care and IV antibiotics.  Daily wound care by urology Plan: Continue broad-spectrum antibiotics Linezolid IV Zosyn IV Pharmacy following for dose Follow blood and urine cultures, no growth to date Supplemental IV fluids APAP as needed fever As needed pain control Continue Foley catheter Appreciate urology follow-up Daily wound care  Essential hypertension: -prn Hydralazine -hold home Cozar due to AKI -Start amlodipine 5mg  daily  Acute kidney injury Suspect acute tubular necrosis secondary to broad-spectrum antibiotics Postobstructive uropathy and prerenal azotemia less likely Patient has been on continuous IV fluids He has Foley catheter in place and has adequate urine output Antibiotic regimen changed after discussion with ID pharmacy Daily renal function Renal function improving over interval  HLD (hyperlipidemia) -Lipitor  Coronary artery disease: s/p of non-DES. No CP -ASA, lipitro  Diabetes mellitus without complication ([place]): Most recent A1c 7.5 , poorly controled.  Patient is taking Januvia, glipizide, Metformin, Lantus at home Plan: Lantus 23 units daily NovoLog 8 units 3 times daily with meals Sliding scale insulin CBG before meals and at bedtime Carb modified diet DM coordinator consult  Tobacco abuse -Nicotine patch  Hyponatremia: Na corrected to 130-132. -on IV NS as above  GERD: -Protonix  DVT prophylaxis: Lovenox Code Status: Full Family Communication: Significant other at bedside on 6/7 Disposition Plan:Status is: Inpatient  Remains inpatient appropriate because:Inpatient level of care appropriate due to severity of illness   Dispo: The patient is from: Home  Anticipated d/c is to: Home              Anticipated d/c date  is: 3 days              Patient currently is not medically stable to d/c.  Still on broad-spectrum antibiotics in the setting of sepsis secondary to Fournier's gangrene.  Urology following for wound care.  Possible return the OR during this admission.  Consultants:   Urology    Procedures:   Debridement of scrotal abscess/cellulitis, 07/20/19  Antimicrobials:   Vancomycin IV, 07/20/2019-07/22/19  Zosyn IV 07/20/2019-  Linezolid, 07/22/19   Subjective: Patient seen and examined.  No acute distress.  Some pain in scrotal area but well controlled.  Objective: Vitals:   07/22/19 2141 07/23/19 0429 07/23/19 1044 07/23/19 1139  BP: (!) 142/86 (!) 156/90 (!) 148/62 (!) 143/91  Pulse: 90 88 84 82  Resp: 20 20 18 15   Temp: 98.3 F (36.8 C) 97.7 F (36.5 C) 98.2 F (36.8 C) 97.8 F (36.6 C)  TempSrc: Oral Oral Oral Oral  SpO2: 99% 97% 98% 98%  Weight:      Height:        Intake/Output Summary (Last 24 hours) at 07/23/2019 1349 Last data filed at 07/23/2019 1152 Gross per 24 hour  Intake 0 ml  Output 4375 ml  Net -4375 ml   Filed Weights   07/20/19 0943  Weight: 113.4 kg    Examination:  General exam: Appears calm and comfortable  Respiratory system: Clear to auscultation. Respiratory effort normal. Cardiovascular system: S1 & S2 heard, RRR. No JVD, murmurs, rubs, gallops or clicks. No pedal edema. Gastrointestinal system: Abdomen is nondistended, soft and nontender. No organomegaly or masses felt. Normal bowel sounds heard. Central nervous system: Alert and oriented. No focal neurological deficits. Extremities: Symmetric 5 x 5 power. Skin: No rashes, lesions or ulcers Psychiatry: Judgement and insight appear normal. Mood & affect appropriate.     Data Reviewed: I have personally reviewed following labs and imaging studies  CBC: Recent Labs  Lab 07/20/19 1059 07/21/19 0641 07/22/19 0500 07/23/19 0752  WBC 30.9* 21.2* 19.0* 15.7*  NEUTROABS 26.8*  --  14.9* 12.0*   HGB 15.0 12.4* 11.5* 11.2*  HCT 41.4 35.1* 33.3* 32.4*  MCV 91.0 91.6 94.9 94.7  PLT 193 164 202 240   Basic Metabolic Panel: Recent Labs  Lab 07/20/19 1005 07/21/19 0641 07/22/19 0500 07/22/19 0535 07/23/19 0752  NA 127* 133* 136  --  142  K 3.8 3.6 3.6  --  3.6  CL 94* 101 106  --  113*  CO2 20* 23 22  --  23  GLUCOSE 307* 285* 251*  --  163*  BUN 11 18 22*  --  17  CREATININE 1.10 1.60* 1.93* 1.97* 1.69*  CALCIUM 8.3* 7.5* 7.5*  --  7.4*   GFR: Estimated Creatinine Clearance: 74 mL/min (A) (by C-G formula based on SCr of 1.69 mg/dL (H)). Liver Function Tests: Recent Labs  Lab 07/20/19 1005  AST 16  ALT 21  ALKPHOS 91  BILITOT 1.6*  PROT 7.0  ALBUMIN 3.4*   No results for input(s): LIPASE, AMYLASE in the last 168 hours. No results for input(s): AMMONIA in the last 168 hours. Coagulation Profile: No results for input(s): INR, PROTIME in the last 168 hours. Cardiac Enzymes: No results for input(s): CKTOTAL, CKMB, CKMBINDEX, TROPONINI in the last 168 hours. BNP (last 3 results) No results for input(s): PROBNP in the last 8760 hours.  HbA1C: No results for input(s): HGBA1C in the last 72 hours. CBG: Recent Labs  Lab 07/22/19 1148 07/22/19 1737 07/22/19 2200 07/23/19 0805 07/23/19 1142  GLUCAP 242* 252* 200* 150* 209*   Lipid Profile: No results for input(s): CHOL, HDL, LDLCALC, TRIG, CHOLHDL, LDLDIRECT in the last 72 hours. Thyroid Function Tests: No results for input(s): TSH, T4TOTAL, FREET4, T3FREE, THYROIDAB in the last 72 hours. Anemia Panel: No results for input(s): VITAMINB12, FOLATE, FERRITIN, TIBC, IRON, RETICCTPCT in the last 72 hours. Sepsis Labs: Recent Labs  Lab 07/20/19 1005 07/20/19 1853  PROCALCITON  --  2.64  LATICACIDVEN 1.5  --     Recent Results (from the past 240 hour(s))  SARS Coronavirus 2 by RT PCR (hospital order, performed [facility]) Nasopharyngeal Nasopharyngeal Swab     Status: None   Collection Time:  07/20/19  2:08 PM   Specimen: Nasopharyngeal Swab  Result Value Ref Range Status   SARS Coronavirus 2 NEGATIVE NEGATIVE Final    Comment: (NOTE) SARS-CoV-2 target nucleic acids are NOT DETECTED. The SARS-CoV-2 RNA is generally detectable in upper and lower respiratory specimens during the acute phase of infection. The lowest concentration of SARS-CoV-2 viral copies this assay can detect is 250 copies / mL. A negative result does not preclude SARS-CoV-2 infection and should not be used as the sole basis for treatment or other patient management decisions.  A negative result may occur with improper specimen collection / handling, submission of specimen other than nasopharyngeal swab, presence of viral mutation(s) within the areas targeted by this assay, and inadequate number of viral copies (<250 copies / mL). A negative result must be combined with clinical observations, patient history, and epidemiological information. Fact Sheet for Patients:   BoilerBrush.com.cy Fact Sheet for Healthcare Providers: https://pope.com/ This test is not yet approved or cleared  by the [place] FDA and has been authorized for detection and/or diagnosis of SARS-CoV-2 by FDA under an Emergency Use Authorization (EUA).  This EUA will remain in effect (meaning this test can be used) for the duration of the COVID-19 declaration under Section 564(b)(1) of the Act, 21 U.S.C. section 360bbb-3(b)(1), unless the authorization is terminated or revoked sooner. Performed at [facility], [address] [place], [place] [address]   Aerobic/Anaerobic Culture (surgical/deep wound)     Status: None (Preliminary result)   Collection Time: 07/20/19  4:13 PM   Specimen: PATH Other; Wound  Result Value Ref Range Status   Specimen Description   Final    WOUND Performed at [facility], [address], [place], [place] [address]    Special Requests    Final    NONE Performed at [facility] [address] [place], [place] [address]    Gram Stain   Final    FEW WBC PRESENT, PREDOMINANTLY PMN MODERATE GRAM POSITIVE COCCI MODERATE GRAM NEGATIVE RODS    Culture   Final    HOLDING FOR POSSIBLE ANAEROBE Performed at [facility], [address], [place], [place] [address]    Report Status PENDING  Incomplete  CULTURE, BLOOD (ROUTINE X 2) w Reflex to ID Panel     Status: None (Preliminary result)   Collection Time: 07/20/19  6:51 PM   Specimen: BLOOD  Result Value Ref Range Status   Specimen Description BLOOD BLOOD RIGHT HAND  Final   Special Requests   Final    BOTTLES DRAWN AEROBIC AND ANAEROBIC Blood Culture adequate volume   Culture  Final    NO GROWTH 3 DAYS Performed at [facility], [address] [place], [place] [address]    Report Status PENDING  Incomplete  CULTURE, BLOOD (ROUTINE X 2) w Reflex to ID Panel     Status: None (Preliminary result)   Collection Time: 07/20/19  7:52 PM   Specimen: BLOOD  Result Value Ref Range Status   Specimen Description BLOOD BLOOD RIGHT HAND  Final   Special Requests   Final    BOTTLES DRAWN AEROBIC ONLY Blood Culture adequate volume   Culture   Final    NO GROWTH 3 DAYS Performed at [facility], [address], [place], [place] [address]    Report Status PENDING  Incomplete         Radiology Studies: No results found.      Scheduled Meds: . aspirin EC  81 mg Oral Daily  . atorvastatin  40 mg Oral Daily  . Chlorhexidine Gluconate Cloth  6 each Topical Daily  . enoxaparin (LOVENOX) injection  40 mg Subcutaneous Daily  . insulin aspart  0-15 Units Subcutaneous TID WC  . insulin aspart  0-5 Units Subcutaneous QHS  . insulin aspart  8 Units Subcutaneous TID WC  . insulin glargine  23 Units Subcutaneous QHS  . nicotine  21 mg Transdermal Daily  . pantoprazole  40 mg Oral Daily  . sodium chloride flush  10-40 mL Intracatheter Q12H    Continuous Infusions: . sodium chloride 125 mL/hr at 07/22/19 2203  . sodium chloride 10 mL/hr at 07/22/19 0412  . linezolid (ZYVOX) IV 600 mg (07/23/19 1037)  . piperacillin-tazobactam (ZOSYN)  IV 3.375 g (07/23/19 1254)  . sodium chloride       LOS: 3 days    Time spent: 35 minutes    Tresa Moore, MD [facility] Pager [phone]  If 7PM-7AM, please contact night-coverage 07/23/2019, 1:49 PM

## 2019-07-23 NOTE — Progress Notes (Signed)
Urology Inpatient Progress Note  Subjective: Martin Mullen is a 39 y.o. male with PMH uncontrolled diabetes and recurrent scrotal infections admitted on 07/20/2019 with Fournier's gangrene.  He is POD 3 from scrotal exploration with debridement with Dr. Gloriann Loan.  Blood cultures pending with no growth at 3 days.  Wound cultures pending with gram-positive cocci and gram-negative rods with possible anaerobic involvement.  On antibiotics as below.  He is afebrile, VSS.  WBC count downtrending, 15.7 today.  Coley catheter in place draining clear, yellow urine.  Today, patient reports feeling well.  No acute concerns.  Anti-infectives: Anti-infectives (From admission, onward)   Start     Dose/Rate Route Frequency Ordered Stop   07/22/19 1300  linezolid (ZYVOX) IVPB 600 mg     600 mg 300 mL/hr over 60 Minutes Intravenous Every 12 hours 07/22/19 1212     07/21/19 2046  vancomycin (VANCOREADY) IVPB 750 mg/150 mL  Status:  Discontinued     750 mg 150 mL/hr over 60 Minutes Intravenous Every 12 hours 07/21/19 1156 07/22/19 1212   07/21/19 0000  vancomycin (VANCOREADY) IVPB 750 mg/150 mL  Status:  Discontinued     750 mg 150 mL/hr over 60 Minutes Intravenous Every 8 hours 07/20/19 1613 07/21/19 1156   07/20/19 1730  clindamycin (CLEOCIN) IVPB 600 mg  Status:  Discontinued     600 mg 100 mL/hr over 30 Minutes Intravenous Every 8 hours 07/20/19 1726 07/20/19 1745   07/20/19 1615  vancomycin (VANCOCIN) 2,500 mg in sodium chloride 0.9 % 500 mL IVPB  Status:  Discontinued     2,500 mg 250 mL/hr over 120 Minutes Intravenous  Once 07/20/19 1600 07/20/19 1810   07/20/19 1615  piperacillin-tazobactam (ZOSYN) IVPB 3.375 g     3.375 g 12.5 mL/hr over 240 Minutes Intravenous Every 8 hours 07/20/19 1606     07/20/19 1615  vancomycin (VANCOREADY) IVPB 2000 mg/400 mL  Status:  Discontinued     2,000 mg 200 mL/hr over 120 Minutes Intravenous  Once 07/20/19 1613 07/20/19 2152   07/20/19 1615  vancomycin  (VANCOREADY) IVPB 500 mg/100 mL  Status:  Discontinued     500 mg 100 mL/hr over 60 Minutes Intravenous  Once 07/20/19 1613 07/20/19 2152   07/20/19 1558  piperacillin-tazobactam (ZOSYN) 3.375 GM/50ML IVPB    Note to Pharmacy: Huffines, Mindy   : cabinet override      07/20/19 1558 07/20/19 1603   07/20/19 1345  vancomycin (VANCOCIN) IVPB 1000 mg/200 mL premix  Status:  Discontinued     1,000 mg 200 mL/hr over 60 Minutes Intravenous  Once 07/20/19 1331 07/20/19 1600   07/20/19 1345  piperacillin-tazobactam (ZOSYN) IVPB 3.375 g     3.375 g 100 mL/hr over 30 Minutes Intravenous  Once 07/20/19 1342 07/20/19 1610   07/20/19 1315  vancomycin (VANCOCIN) injection 1 g  Status:  Discontinued     1 g Intravenous  Once 07/20/19 1303 07/20/19 1331   07/20/19 1315  piperacillin-tazobactam (ZOSYN) IVPB 4.5 g  Status:  Discontinued     4.5 g 200 mL/hr over 30 Minutes Intravenous  Once 07/20/19 1303 07/20/19 1342      Current Facility-Administered Medications  Medication Dose Route Frequency Provider Last Rate Last Admin  . 0.9 %  sodium chloride infusion   Intravenous Continuous Ralene Muskrat B, MD 125 mL/hr at 07/22/19 2203 New Bag at 07/22/19 2203  . 0.9 %  sodium chloride infusion   Intravenous PRN Ivor Costa, MD 10 mL/hr at 07/22/19 0412 Rate  Verify at 07/22/19 0412  . acetaminophen (TYLENOL) tablet 650 mg  650 mg Oral Q6H PRN Lorretta Harp, MD   650 mg at 07/21/19 0129  . aspirin EC tablet 81 mg  81 mg Oral Daily Lorretta Harp, MD   81 mg at 07/22/19 0908  . atorvastatin (LIPITOR) tablet 40 mg  40 mg Oral Daily Lorretta Harp, MD   40 mg at 07/22/19 0908  . Chlorhexidine Gluconate Cloth 2 % PADS 6 each  6 each Topical Daily Lorretta Harp, MD   6 each at 07/22/19 [phone]  . enoxaparin (LOVENOX) injection 40 mg  40 mg Subcutaneous Daily Ray Church III, MD   40 mg at 07/22/19 0907  . hydrALAZINE (APRESOLINE) injection 5 mg  5 mg Intravenous Q2H PRN Lorretta Harp, MD      . insulin aspart (novoLOG) injection  0-15 Units  0-15 Units Subcutaneous TID WC Lolita Patella B, MD   2 Units at 07/23/19 0913  . insulin aspart (novoLOG) injection 0-5 Units  0-5 Units Subcutaneous QHS Lolita Patella B, MD   3 Units at 07/21/19 2318  . insulin aspart (novoLOG) injection 8 Units  8 Units Subcutaneous TID WC Lolita Patella B, MD   8 Units at 07/23/19 0913  . insulin glargine (LANTUS) injection 23 Units  23 Units Subcutaneous QHS Lolita Patella B, MD   23 Units at 07/22/19 2214  . linezolid (ZYVOX) IVPB 600 mg  600 mg Intravenous Q12H Lolita Patella B, MD 300 mL/hr at 07/22/19 2205 600 mg at 07/22/19 2205  . morphine 2 MG/ML injection 2 mg  2 mg Intravenous Q4H PRN Lorretta Harp, MD   2 mg at 07/23/19 0902  . nicotine (NICODERM CQ - dosed in mg/24 hours) patch 21 mg  21 mg Transdermal Daily Lorretta Harp, MD      . nitroGLYCERIN (NITROSTAT) SL tablet 0.4 mg  0.4 mg Sublingual Q5 min PRN Lorretta Harp, MD      . ondansetron [facility]) injection 4 mg  4 mg Intravenous Q8H PRN Lorretta Harp, MD      . oxyCODONE-acetaminophen (PERCOCET/ROXICET) 5-325 MG per tablet 1 tablet  1 tablet Oral Q4H PRN Lorretta Harp, MD   1 tablet at 07/21/19 1410  . pantoprazole (PROTONIX) EC tablet 40 mg  40 mg Oral Daily Lorretta Harp, MD   40 mg at 07/22/19 0908  . piperacillin-tazobactam (ZOSYN) IVPB 3.375 g  3.375 g Intravenous Alean Rinne, MD 12.5 mL/hr at 07/23/19 0602 3.375 g at 07/23/19 0602  . sodium chloride 0.9 % bolus 2,000 mL  2,000 mL Intravenous Once Lorretta Harp, MD      . sodium chloride flush (NS) 0.9 % injection 10-40 mL  10-40 mL Intracatheter Q12H Georgeann Oppenheim, Sudheer B, MD   10 mL at 07/22/19 2211  . sodium chloride flush (NS) 0.9 % injection 10-40 mL  10-40 mL Intracatheter PRN Georgeann Oppenheim, Sudheer B, MD       Objective: Vital signs in last 24 hours: Temp:  [97.7 F (36.5 C)-98.7 F (37.1 C)] 97.7 F (36.5 C) (06/08 0429) Pulse Rate:  [88-90] 88 (06/08 0429) Resp:  [13-20] 20 (06/08 0429) BP: (126-156)/(79-90) 156/90 (06/08  0429) SpO2:  [97 %-99 %] 97 % (06/08 0429)  Intake/Output from previous day: 06/07 0701 - 06/08 0700 In: 360 [P.O.:360] Out: 2750 [Urine:2750] Intake/Output this shift: No intake/output data recorded.  Physical Exam Vitals and nursing note reviewed.  Constitutional:      General: He is not in acute distress.  Appearance: He is not ill-appearing, toxic-appearing or diaphoretic.  HENT:     Head: Normocephalic and atraumatic.  Pulmonary:     Effort: Pulmonary effort is normal. No respiratory distress.  Genitourinary:    Comments: Wet-to-dry dressing removed, revealing a pink wound bed with healthy granulation tissue.  No necrosis noted.  Exposed testicle appears viable.  Photos taken and attached to chart. Skin:    General: Skin is warm and dry.  Neurological:     Mental Status: He is alert and oriented to person, place, and time.  Psychiatric:        Mood and Affect: Mood normal.        Behavior: Behavior normal.    Lab Results:  Recent Labs    07/22/19 0500 07/23/19 0752  WBC 19.0* 15.7*  HGB 11.5* 11.2*  HCT 33.3* 32.4*  PLT 202 240   BMET Recent Labs    07/22/19 0500 07/22/19 0500 07/22/19 0535 07/23/19 0752  NA 136  --   --  142  K 3.6  --   --  3.6  CL 106  --   --  113*  CO2 22  --   --  23  GLUCOSE 251*  --   --  163*  BUN 22*  --   --  17  CREATININE 1.93*   < > 1.97* 1.69*  CALCIUM 7.5*  --   --  7.4*   < > = values in this interval not displayed.   Assessment & Plan: 39 year old male with a history of uncontrolled diabetes now s/p scrotal debridement in the setting of Fournier's gangrene.  We will proceed with plastic surgery consultation for wound closure, see media tab for wound photos from this morning.  I accompanied Dr. Lonna Cobb at the bedside this morning evaluate the patient.  We performed his morning wet-to-dry dressing change together.  Wound appears healthy, no need for return to the OR today.  Okay for diet  today.  Recommendations: -Continue broad-spectrum antibiotics -Continue tight diabetes control -Follow cultures -Urology to follow closely; continue twice daily wet-to-dry dressing changes.  Recommend premedication with 2 mg IV morphine for these.  Carman Ching, PA-C 07/23/2019

## 2019-07-23 NOTE — Telephone Encounter (Signed)
What is he requesting STD for?

## 2019-07-23 NOTE — Telephone Encounter (Signed)
This would be done at the hospital follow up if can not be completed inpatient.

## 2019-07-24 DIAGNOSIS — N493 Fournier gangrene: Secondary | ICD-10-CM

## 2019-07-24 DIAGNOSIS — E1169 Type 2 diabetes mellitus with other specified complication: Secondary | ICD-10-CM

## 2019-07-24 DIAGNOSIS — I10 Essential (primary) hypertension: Secondary | ICD-10-CM

## 2019-07-24 LAB — CBC WITH DIFFERENTIAL/PLATELET
Abs Immature Granulocytes: 0.06 10*3/uL (ref 0.00–0.07)
Basophils Absolute: 0.1 10*3/uL (ref 0.0–0.1)
Basophils Relative: 1 %
Eosinophils Absolute: 0.4 10*3/uL (ref 0.0–0.5)
Eosinophils Relative: 3 %
HCT: 31.9 % — ABNORMAL LOW (ref 39.0–52.0)
Hemoglobin: 11 g/dL — ABNORMAL LOW (ref 13.0–17.0)
Immature Granulocytes: 0 %
Lymphocytes Relative: 20 %
Lymphs Abs: 2.8 10*3/uL (ref 0.7–4.0)
MCH: 32.7 pg (ref 26.0–34.0)
MCHC: 34.5 g/dL (ref 30.0–36.0)
MCV: 94.9 fL (ref 80.0–100.0)
Monocytes Absolute: 1.3 10*3/uL — ABNORMAL HIGH (ref 0.1–1.0)
Monocytes Relative: 9 %
Neutro Abs: 9.5 10*3/uL — ABNORMAL HIGH (ref 1.7–7.7)
Neutrophils Relative %: 67 %
Platelets: 244 10*3/uL (ref 150–400)
RBC: 3.36 MIL/uL — ABNORMAL LOW (ref 4.22–5.81)
RDW: 13.2 % (ref 11.5–15.5)
Smear Review: NORMAL
WBC: 14 10*3/uL — ABNORMAL HIGH (ref 4.0–10.5)
nRBC: 0 % (ref 0.0–0.2)

## 2019-07-24 LAB — BASIC METABOLIC PANEL
Anion gap: 5 (ref 5–15)
BUN: 10 mg/dL (ref 6–20)
CO2: 25 mmol/L (ref 22–32)
Calcium: 7.6 mg/dL — ABNORMAL LOW (ref 8.9–10.3)
Chloride: 111 mmol/L (ref 98–111)
Creatinine, Ser: 1.64 mg/dL — ABNORMAL HIGH (ref 0.61–1.24)
GFR calc Af Amer: >60 mL/min (ref >60)
GFR calc non Af Amer: 52 mL/min — ABNORMAL LOW (ref >60)
Glucose, Bld: 171 mg/dL — ABNORMAL HIGH (ref 70–99)
Potassium: 3.4 mmol/L — ABNORMAL LOW (ref 3.5–5.1)
Sodium: 141 mmol/L (ref 135–145)

## 2019-07-24 LAB — GLUCOSE, CAPILLARY
Glucose-Capillary: 138 mg/dL — ABNORMAL HIGH (ref 70–99)
Glucose-Capillary: 166 mg/dL — ABNORMAL HIGH (ref 70–99)
Glucose-Capillary: 152 mg/dL — ABNORMAL HIGH (ref 70–99)
Glucose-Capillary: 189 mg/dL — ABNORMAL HIGH (ref 70–99)

## 2019-07-24 LAB — AEROBIC/ANAEROBIC CULTURE W GRAM STAIN (SURGICAL/DEEP WOUND)

## 2019-07-24 MED ORDER — INSULIN ASPART 100 UNIT/ML ~~LOC~~ SOLN
10.0000 [IU] | Freq: Three times a day (TID) | SUBCUTANEOUS | Status: DC
  Administered 2019-07-24 – 2019-07-26 (×3): 10 [IU] via SUBCUTANEOUS
  Filled 2019-07-24 (×3): qty 1

## 2019-07-24 MED ORDER — INSULIN GLARGINE 100 UNIT/ML ~~LOC~~ SOLN
12.0000 [IU] | Freq: Every day | SUBCUTANEOUS | Status: DC
  Administered 2019-07-24: 12 [IU] via SUBCUTANEOUS
  Filled 2019-07-24 (×2): qty 0.12

## 2019-07-24 NOTE — Progress Notes (Addendum)
   Subjective Afebrile, no acute events overnight  Physical Exam: BP 123/72 (BP Location: Right Arm)   Pulse 76   Temp 97.7 F (36.5 C) (Oral)   Resp 18   Ht 5\' 10"  (1.778 m)   Wt 113.4 kg   SpO2 95%   BMI 35.87 kg/m    Constitutional:  Alert and oriented, No acute distress. Respiratory: Normal respiratory effort, no increased work of breathing. GI: Abdomen is soft, non-tender, non-distended GU: Right hemiscrotum resected, wound edges pink and healthy-appearing, no active bleeding, no area of dusky or necrotic tissue, no spreading erythema, right testicle is exposed, pink, and viable appearing Drains: Foley with clear yellow urine  Laboratory Data: Reviewed, leukocytosis downtrending   Assessment & Plan:   39 year old male with diabetes POD#4 from right hemiscrotum resection for Fournier's gangrene.  Wound appears clean with no evidence of residual necrotic tissue this morning.  Okay for diet today.  I discussed his case at length with Dr. 24 of plastic surgery.  He will plan to take him to the operating room tomorrow afternoon for mesh coverage of the testicle to facilitate ease of wound care as an outpatient.  Anticipate he could be discharged Friday with outpatient wound care.  Maintain foley Continue broad-spectrum antibiotics Continue tight diabetes control OR tomorrow for partial closure with plastic surgery, n.p.o. at midnight(ordered)  I spent 25 total minutes on the floor and greater than 50% was spent in counseling coordination of care with the patient.  Dressing was changed, and we discussed wound care options moving forward at length.   Wednesday, MD

## 2019-07-24 NOTE — Progress Notes (Signed)
Patient ID: Martin Mullen, male   DOB: 1980/08/19, 39 y.o.   MRN: [id number] [facility] PROGRESS NOTE  Martin Mullen XLK:[id number] DOB: [birth year] DOA: 07/20/2019 PCP: Martin Fenton, NP  HPI/Subjective: Patient feels okay for just lying there.  When he is walking around he does have some pain in the testicle area.  Does have a little constipation but otherwise feels okay.  Objective: Vitals:   07/24/19 0502 07/24/19 1221  BP: 123/72 (!) 153/93  Pulse: 76 75  Resp: 18 15  Temp: 97.7 F (36.5 C) 98.6 F (37 C)  SpO2: 95% 99%    Intake/Output Summary (Last 24 hours) at 07/24/2019 1533 Last data filed at 07/24/2019 1431 Gross per 24 hour  Intake 7047.6 ml  Output 5050 ml  Net 1997.6 ml   Filed Weights   07/20/19 0943  Weight: 113.4 kg    ROS: Review of Systems  Constitutional: Negative for fever.  Eyes: Negative for blurred vision.  Respiratory: Negative for cough and shortness of breath.   Cardiovascular: Negative for chest pain.  Gastrointestinal: Positive for constipation. Negative for abdominal pain, diarrhea, nausea and vomiting.  Genitourinary: Negative for dysuria.  Musculoskeletal: Negative for joint pain.  Neurological: Negative for dizziness.   Exam: Physical Exam  Constitutional: He is oriented to person, place, and time.  HENT:  Nose: No mucosal edema.  Mouth/Throat: No oropharyngeal exudate or posterior oropharyngeal edema.  Eyes: Conjunctivae and lids are normal.  Neck: Carotid bruit is not present.  Cardiovascular: S1 normal and S2 normal. Exam reveals no gallop.  No murmur heard. Respiratory: No respiratory distress. He has no wheezes. He has no rhonchi. He has no rales.  GI: Soft. Bowel sounds are normal. There is no abdominal tenderness.  Genitourinary:    Genitourinary Comments: Area just redressed by urology   Musculoskeletal:     Right ankle: No swelling.     Left ankle: No swelling.  Lymphadenopathy:    He has no cervical adenopathy.   Neurological: He is alert and oriented to person, place, and time. No cranial nerve deficit.  Skin: Skin is warm. No rash noted. Nails show no clubbing.  Psychiatric: He has a normal mood and affect.      Data Reviewed: Basic Metabolic Panel: Recent Labs  Lab 07/20/19 1005 07/20/19 1005 07/21/19 0641 07/22/19 0500 07/22/19 0535 07/23/19 0752 07/24/19 1200  NA 127*  --  133* 136  --  142 141  K 3.8  --  3.6 3.6  --  3.6 3.4*  CL 94*  --  101 106  --  113* 111  CO2 20*  --  23 22  --  23 25  GLUCOSE 307*  --  285* 251*  --  163* 171*  BUN 11  --  18 22*  --  17 10  CREATININE 1.10   < > 1.60* 1.93* 1.97* 1.69* 1.64*  CALCIUM 8.3*  --  7.5* 7.5*  --  7.4* 7.6*   < > = values in this interval not displayed.   Liver Function Tests: Recent Labs  Lab 07/20/19 1005  AST 16  ALT 21  ALKPHOS 91  BILITOT 1.6*  PROT 7.0  ALBUMIN 3.4*   CBC: Recent Labs  Lab 07/20/19 1059 07/21/19 0641 07/22/19 0500 07/23/19 0752 07/24/19 1200  WBC 30.9* 21.2* 19.0* 15.7* 14.0*  NEUTROABS 26.8*  --  14.9* 12.0* 9.5*  HGB 15.0 12.4* 11.5* 11.2* 11.0*  HCT 41.4 35.1* 33.3* 32.4* 31.9*  MCV 91.0 91.6 94.9  94.7 94.9  PLT 193 164 202 240 244    CBG: Recent Labs  Lab 07/23/19 1142 07/23/19 1703 07/23/19 2153 07/24/19 0753 07/24/19 1208  GLUCAP 209* 125* 225* 138* 166*    Recent Results (from the past 240 hour(s))  SARS Coronavirus 2 by RT PCR (hospital order, performed in [facility] lab) Nasopharyngeal Nasopharyngeal Swab     Status: None   Collection Time: 07/20/19  2:08 PM   Specimen: Nasopharyngeal Swab  Result Value Ref Range Status   SARS Coronavirus 2 NEGATIVE NEGATIVE Final    Comment: (NOTE) SARS-CoV-2 target nucleic acids are NOT DETECTED. The SARS-CoV-2 RNA is generally detectable in upper and lower respiratory specimens during the acute phase of infection. The lowest concentration of SARS-CoV-2 viral copies this assay can detect is 250 copies / mL. A  negative result does not preclude SARS-CoV-2 infection and should not be used as the sole basis for treatment or other patient management decisions.  A negative result may occur with improper specimen collection / handling, submission of specimen other than nasopharyngeal swab, presence of viral mutation(s) within the areas targeted by this assay, and inadequate number of viral copies (<250 copies / mL). A negative result must be combined with clinical observations, patient history, and epidemiological information. Fact Sheet for Patients:   BoilerBrush.com.cy Fact Sheet for Healthcare Providers: https://pope.com/ This test is not yet approved or cleared  by the [place] FDA and has been authorized for detection and/or diagnosis of SARS-CoV-2 by FDA under an Emergency Use Authorization (EUA).  This EUA will remain in effect (meaning this test can be used) for the duration of the COVID-19 declaration under Section 564(b)(1) of the Act, 21 U.S.C. section 360bbb-3(b)(1), unless the authorization is terminated or revoked sooner. Performed at [facility], [address] [place], [place] [address]   Aerobic/Anaerobic Culture (surgical/deep wound)     Status: None   Collection Time: 07/20/19  4:13 PM   Specimen: PATH Other; Wound  Result Value Ref Range Status   Specimen Description   Final    WOUND Performed at [facility], [address] [place], [place] [address]    Special Requests   Final    NONE Performed at [facility], [address] [place], [place] [address]    Gram Stain   Final    FEW WBC PRESENT, PREDOMINANTLY PMN MODERATE GRAM POSITIVE COCCI MODERATE GRAM NEGATIVE RODS Performed at [facility], [address], [place], [place] [address]    Culture   Final    MIXED ANAEROBIC FLORA PRESENT.  CALL LAB IF FURTHER IID REQUIRED.   Report Status 07/24/2019 FINAL  Final  CULTURE,  BLOOD (ROUTINE X 2) w Reflex to ID Panel     Status: None (Preliminary result)   Collection Time: 07/20/19  6:51 PM   Specimen: BLOOD  Result Value Ref Range Status   Specimen Description BLOOD BLOOD RIGHT HAND  Final   Special Requests   Final    BOTTLES DRAWN AEROBIC AND ANAEROBIC Blood Culture adequate volume   Culture   Final    NO GROWTH 4 DAYS Performed at [facility], [address] [place], [place] [address]    Report Status PENDING  Incomplete  CULTURE, BLOOD (ROUTINE X 2) w Reflex to ID Panel     Status: None (Preliminary result)   Collection Time: 07/20/19  7:52 PM   Specimen: BLOOD  Result Value Ref Range Status   Specimen  Description BLOOD BLOOD RIGHT HAND  Final   Special Requests   Final    BOTTLES DRAWN AEROBIC ONLY Blood Culture adequate volume   Culture   Final    NO GROWTH 4 DAYS Performed at [facility], [address] [place], [place] [address]    Report Status PENDING  Incomplete     Scheduled Meds:  amLODipine  5 mg Oral Daily   aspirin EC  81 mg Oral Daily   atorvastatin  40 mg Oral Daily   Chlorhexidine Gluconate Cloth  6 each Topical Daily   enoxaparin (LOVENOX) injection  40 mg Subcutaneous Daily   insulin aspart  0-15 Units Subcutaneous TID WC   insulin aspart  0-5 Units Subcutaneous QHS   insulin aspart  10 Units Subcutaneous TID WC   insulin glargine  23 Units Subcutaneous QHS   nicotine  21 mg Transdermal Daily   pantoprazole  40 mg Oral Daily   sodium chloride flush  10-40 mL Intracatheter Q12H   Continuous Infusions:  sodium chloride 125 mL/hr at 07/24/19 1418   sodium chloride Stopped (07/24/19 1240)   linezolid (ZYVOX) IV Stopped (07/24/19 1121)   piperacillin-tazobactam (ZOSYN)  IV 3.375 g (07/24/19 1240)   sodium chloride      Assessment/Plan:  1. Sepsis secondary to Fournier's gangrene.  Present on admission.  Scrotal debridement and exploration done on 07/20/2019.  Patient will have to go  back to the operating room at some point to have closure of the wound.  Patient on IV Zosyn and Zyvox at this point.  Pain control. 2. Type 2 diabetes mellitus on glargine insulin and sliding scale. 3. Essential hypertension on Norvasc 4. Hyperlipidemia unspecified on atorvastatin 5. Tobacco abuse on nicotine patch 6. GERD on PPI   Code Status:     Code Status Orders  (From admission, onward)         Start     Ordered   07/20/19 1522  Full code  Continuous     07/20/19 1521        Code Status History    Date Active Date Inactive Code Status Order ID Comments User Context   03/18/2017 2306 03/21/2017 1709 Full Code [id number]  Cammy Copa, MD Inpatient   03/05/2015 1616 03/06/2015 1834 Full Code [id number]  Gale Journey, MD Inpatient   08/28/2012 1038 08/30/2012 1429 Full Code [id number]  Pamella Pert, MD Inpatient   Advance Care Planning Activity     Disposition Plan: Status is: Inpatient   Dispo: The patient is from: Home              Anticipated d/c is to: Home              Anticipated d/c date is: Likely will need a few days in the hospital              Patient currently receiving IV antibiotics.  Urology would like to do a closure procedure prior to any disposition.  They are trying to coordinate with plastic surgery.  Consultants:  Urology  Procedures:  Scrotal debridement and exploration  Antibiotics:  Zyvox  Zosyn  Time spent: 28 minutes  [employer]

## 2019-07-24 NOTE — Progress Notes (Signed)
Inpatient Diabetes Program Recommendations  AACE/ADA: New Consensus Statement on Inpatient Glycemic Control   Target Ranges:  Prepandial:   less than 140 mg/dL      Peak postprandial:   less than 180 mg/dL (1-2 hours)      Critically ill patients:  140 - 180 mg/dL   Results for Martin Mullen, Martin Mullen (MRN [id number]) as of 07/24/2019 09:17  Ref. Range 07/23/2019 08:05 07/23/2019 11:42 07/23/2019 17:03 07/23/2019 21:53 07/24/2019 07:53  Glucose-Capillary Latest Ref Range: 70 - 99 mg/dL 718 (H)  Novolog 10 units 209 (H)  Novolog 13 units 125 (H)  Novolog 10 units 225 (H)  Novolog 2 units   Lantus 23 units 138 (H)  Novolog 10 units   Review of Glycemic Control  Diabetes history: DM2 Outpatient Diabetes medications: Lantus 23 units daily, Glipizide 10 mg BID, Metformin 1000 mg BID, Januvia 100 mg daily Current orders for Inpatient glycemic control: Lantus 23 units QHS, Novolog 8 units TID with meals, Novolog 0-15 units TID with meals, Novolog 0-5 units QHS  Inpatient Diabetes Program Recommendations:    Insulin-Meal Coverage: Please consider increasing meal coverage to Novolog 10 units TID with meals.  Thanks, Orlando Penner, RN, MSN, CDE Diabetes Coordinator Inpatient Diabetes Program [phone] (Team Pager from 8am to 5pm)

## 2019-07-25 ENCOUNTER — Encounter: Payer: Self-pay | Admitting: Internal Medicine

## 2019-07-25 ENCOUNTER — Inpatient Hospital Stay: Payer: BC Managed Care – PPO | Admitting: Anesthesiology

## 2019-07-25 ENCOUNTER — Encounter
Admission: EM | Disposition: A | Discharge status: Home / Self Care | Payer: Self-pay | Source: Home / Self Care | Attending: Internal Medicine

## 2019-07-25 DIAGNOSIS — N493 Fournier gangrene: Secondary | ICD-10-CM

## 2019-07-25 HISTORY — PX: DEBRIDEMENT AND CLOSURE WOUND: SHX5614

## 2019-07-25 LAB — CULTURE, BLOOD (ROUTINE X 2)
Culture: NO GROWTH
Culture: NO GROWTH
Special Requests: ADEQUATE
Special Requests: ADEQUATE

## 2019-07-25 LAB — BASIC METABOLIC PANEL
Anion gap: 9 (ref 5–15)
BUN: 11 mg/dL (ref 6–20)
CO2: 21 mmol/L — ABNORMAL LOW (ref 22–32)
Calcium: 8.1 mg/dL — ABNORMAL LOW (ref 8.9–10.3)
Chloride: 113 mmol/L — ABNORMAL HIGH (ref 98–111)
Creatinine, Ser: 1.64 mg/dL — ABNORMAL HIGH (ref 0.61–1.24)
GFR calc Af Amer: >60 mL/min (ref >60)
GFR calc non Af Amer: 52 mL/min — ABNORMAL LOW (ref >60)
Glucose, Bld: 243 mg/dL — ABNORMAL HIGH (ref 70–99)
Potassium: 4.1 mmol/L (ref 3.5–5.1)
Sodium: 143 mmol/L (ref 135–145)

## 2019-07-25 LAB — GLUCOSE, CAPILLARY
Glucose-Capillary: 238 mg/dL — ABNORMAL HIGH (ref 70–99)
Glucose-Capillary: 196 mg/dL — ABNORMAL HIGH (ref 70–99)
Glucose-Capillary: 124 mg/dL — ABNORMAL HIGH (ref 70–99)
Glucose-Capillary: 130 mg/dL — ABNORMAL HIGH (ref 70–99)

## 2019-07-25 SURGERY — DEBRIDEMENT, WOUND, WITH CLOSURE
Anesthesia: General

## 2019-07-25 MED ORDER — FENTANYL CITRATE (PF) 100 MCG/2ML IJ SOLN
INTRAMUSCULAR | Status: AC
  Administered 2019-07-25: 50 ug via INTRAVENOUS
  Filled 2019-07-25: qty 2

## 2019-07-25 MED ORDER — INSULIN GLARGINE 100 UNIT/ML ~~LOC~~ SOLN
23.0000 [IU] | Freq: Every day | SUBCUTANEOUS | Status: DC
  Administered 2019-07-25: 23 [IU] via SUBCUTANEOUS
  Filled 2019-07-25 (×3): qty 0.23

## 2019-07-25 MED ORDER — FENTANYL CITRATE (PF) 100 MCG/2ML IJ SOLN
INTRAMUSCULAR | Status: AC
  Filled 2019-07-25: qty 2

## 2019-07-25 MED ORDER — SODIUM CHLORIDE FLUSH 0.9 % IV SOLN
INTRAVENOUS | Status: AC
  Filled 2019-07-25: qty 10

## 2019-07-25 MED ORDER — LIDOCAINE HCL (CARDIAC) PF 100 MG/5ML IV SOSY
PREFILLED_SYRINGE | INTRAVENOUS | Status: DC | PRN
  Administered 2019-07-25: 100 mg via INTRAVENOUS

## 2019-07-25 MED ORDER — ACETAMINOPHEN 10 MG/ML IV SOLN
INTRAVENOUS | Status: AC
  Filled 2019-07-25: qty 100

## 2019-07-25 MED ORDER — ONDANSETRON HCL 4 MG/2ML IJ SOLN
INTRAMUSCULAR | Status: AC
  Filled 2019-07-25: qty 2

## 2019-07-25 MED ORDER — FENTANYL CITRATE (PF) 100 MCG/2ML IJ SOLN
INTRAMUSCULAR | Status: DC | PRN
  Administered 2019-07-25: 25 ug via INTRAVENOUS
  Administered 2019-07-25: 50 ug via INTRAVENOUS
  Administered 2019-07-25: 25 ug via INTRAVENOUS

## 2019-07-25 MED ORDER — BUPIVACAINE HCL (PF) 0.5 % IJ SOLN
INTRAMUSCULAR | Status: DC | PRN
  Administered 2019-07-25: 20 mL

## 2019-07-25 MED ORDER — ONDANSETRON HCL 4 MG/2ML IJ SOLN
INTRAMUSCULAR | Status: DC | PRN
  Administered 2019-07-25: 4 mg via INTRAVENOUS

## 2019-07-25 MED ORDER — DEXMEDETOMIDINE HCL 200 MCG/2ML IV SOLN
INTRAVENOUS | Status: DC | PRN
  Administered 2019-07-25: 8 ug via INTRAVENOUS

## 2019-07-25 MED ORDER — FENTANYL CITRATE (PF) 100 MCG/2ML IJ SOLN
25.0000 ug | INTRAMUSCULAR | Status: DC | PRN
  Administered 2019-07-25: 50 ug via INTRAVENOUS

## 2019-07-25 MED ORDER — PHENYLEPHRINE HCL (PRESSORS) 10 MG/ML IV SOLN
INTRAVENOUS | Status: DC | PRN
  Administered 2019-07-25 (×3): 100 ug via INTRAVENOUS
  Administered 2019-07-25: 200 ug via INTRAVENOUS
  Administered 2019-07-25 (×2): 100 ug via INTRAVENOUS

## 2019-07-25 MED ORDER — PROPOFOL 10 MG/ML IV BOLUS
INTRAVENOUS | Status: AC
  Filled 2019-07-25: qty 20

## 2019-07-25 MED ORDER — MIDAZOLAM HCL 2 MG/2ML IJ SOLN
INTRAMUSCULAR | Status: DC | PRN
  Administered 2019-07-25: 2 mg via INTRAVENOUS

## 2019-07-25 MED ORDER — PROMETHAZINE HCL 25 MG/ML IJ SOLN: 6.2500 mg | INTRAMUSCULAR | Status: DC | PRN

## 2019-07-25 MED ORDER — DEXAMETHASONE SODIUM PHOSPHATE 10 MG/ML IJ SOLN
INTRAMUSCULAR | Status: DC | PRN
  Administered 2019-07-25: 4 mg via INTRAVENOUS

## 2019-07-25 MED ORDER — LIDOCAINE HCL (PF) 2 % IJ SOLN
INTRAMUSCULAR | Status: AC
  Filled 2019-07-25: qty 5

## 2019-07-25 MED ORDER — SODIUM CHLORIDE 0.9 % IV SOLN: INTRAVENOUS | Status: DC

## 2019-07-25 MED ORDER — MIDAZOLAM HCL 2 MG/2ML IJ SOLN
INTRAMUSCULAR | Status: AC
  Filled 2019-07-25: qty 2

## 2019-07-25 MED ORDER — ACETAMINOPHEN 10 MG/ML IV SOLN
INTRAVENOUS | Status: DC | PRN
  Administered 2019-07-25: 1000 mg via INTRAVENOUS

## 2019-07-25 MED ORDER — PROMETHAZINE HCL 25 MG/ML IJ SOLN
INTRAMUSCULAR | Status: AC
  Administered 2019-07-25: 6.25 mg via INTRAVENOUS
  Filled 2019-07-25: qty 1

## 2019-07-25 MED ORDER — PROPOFOL 10 MG/ML IV BOLUS
INTRAVENOUS | Status: DC | PRN
  Administered 2019-07-25: 150 mg via INTRAVENOUS

## 2019-07-25 SURGICAL SUPPLY — 44 items
ADH SKN CLS APL DERMABOND .7 (GAUZE/BANDAGES/DRESSINGS)
APL PRP STRL LF DISP 70% ISPRP (MISCELLANEOUS)
BINDER BREAST LRG (GAUZE/BANDAGES/DRESSINGS) IMPLANT
BINDER BREAST MEDIUM (GAUZE/BANDAGES/DRESSINGS) IMPLANT
BINDER BREAST XLRG (GAUZE/BANDAGES/DRESSINGS) IMPLANT
BINDER BREAST XXLRG (GAUZE/BANDAGES/DRESSINGS) IMPLANT
BLADE CLIPPER SURG (BLADE) IMPLANT
BLADE SURG 15 STRL LF DISP TIS (BLADE) ×1 IMPLANT
BLADE SURG 15 STRL SS (BLADE) ×2
CHLORAPREP W/TINT 26 (MISCELLANEOUS) IMPLANT
COVER BACK TABLE REUSABLE LG (DRAPES) ×2 IMPLANT
COVER MAYO STAND REUSABLE (DRAPES) ×2 IMPLANT
DECANTER SPIKE VIAL GLASS SM (MISCELLANEOUS) IMPLANT
DERMABOND ADVANCED (GAUZE/BANDAGES/DRESSINGS)
DERMABOND ADVANCED .7 DNX12 (GAUZE/BANDAGES/DRESSINGS) IMPLANT
DRAPE INCISE IOBAN 66X45 STRL (DRAPES) IMPLANT
DRAPE LAPAROSCOPIC ABDOMINAL (DRAPES) IMPLANT
DRAPE LAPAROTOMY 77X122 PED (DRAPES) IMPLANT
DRAPE SURG 17X23 STRL (DRAPES) IMPLANT
DRSG EMULSION OIL 3X3 NADH (GAUZE/BANDAGES/DRESSINGS) IMPLANT
DRSG MEPITEL 4X7.2 (GAUZE/BANDAGES/DRESSINGS) ×1 IMPLANT
DRSG TEGADERM 4X10 (GAUZE/BANDAGES/DRESSINGS) IMPLANT
DRSG TELFA 3X8 NADH (GAUZE/BANDAGES/DRESSINGS) IMPLANT
ELECT REM PT RETURN 9FT ADLT (ELECTROSURGICAL) ×2
ELECTRODE REM PT RTRN 9FT ADLT (ELECTROSURGICAL) ×1 IMPLANT
GAUZE SPONGE 4X4 12PLY STRL (GAUZE/BANDAGES/DRESSINGS) ×1 IMPLANT
GLOVE BIO SURGEON STRL SZ 6.5 (GLOVE) ×4 IMPLANT
GOWN STRL REUS W/ TWL LRG LVL3 (GOWN DISPOSABLE) ×2 IMPLANT
GOWN STRL REUS W/TWL LRG LVL3 (GOWN DISPOSABLE) ×4
IMPL PRIMATRIX FENESTRATED 8X8 (Miscellaneous) ×1 IMPLANT
NDL HYPO 25X1 1.5 SAFETY (NEEDLE) ×1 IMPLANT
NEEDLE HYPO 25X1 1.5 SAFETY (NEEDLE) ×2 IMPLANT
NS IRRIG 1000ML POUR BTL (IV SOLUTION) ×2 IMPLANT
PAD DRESSING TELFA 3X8 NADH (GAUZE/BANDAGES/DRESSINGS) IMPLANT
SPONGE LAP 18X18 RF (DISPOSABLE) ×2 IMPLANT
SUT MNCRL AB 3-0 PS2 18 (SUTURE) IMPLANT
SUT MNCRL AB 4-0 PS2 18 (SUTURE) IMPLANT
SUT MNCRL+ 5-0 UNDYED PC-3 (SUTURE) IMPLANT
SUT MON AB 5-0 P3 18 (SUTURE) ×1 IMPLANT
SUT MONOCRYL 5-0 (SUTURE)
SUT SILK 3 0 PS 1 (SUTURE) IMPLANT
SUT VIC AB 3-0 SH 27 (SUTURE) ×8
SUT VIC AB 3-0 SH 27X BRD (SUTURE) IMPLANT
SYR BULB IRRIG 60ML STRL (SYRINGE) ×1 IMPLANT

## 2019-07-25 NOTE — Anesthesia Procedure Notes (Signed)
Procedure Name: LMA Insertion Date/Time: 07/25/2019 5:16 PM Performed by: Rosanne Gutting, CRNA Pre-anesthesia Checklist: Patient identified, Patient being monitored, Timeout performed, Emergency Drugs available and Suction available Patient Re-evaluated:Patient Re-evaluated prior to induction Oxygen Delivery Method: Circle system utilized Preoxygenation: Pre-oxygenation with 100% oxygen Induction Type: IV induction Ventilation: Mask ventilation without difficulty LMA: LMA inserted LMA Size: 4.5 Tube type: Oral Number of attempts: 1 Placement Confirmation: positive ETCO2 and breath sounds checked- equal and bilateral Tube secured with: Tape Dental Injury: Teeth and Oropharynx as per pre-operative assessment

## 2019-07-25 NOTE — Op Note (Signed)
Operative Note   DATE OF OPERATION: 07/25/2019  SURGICAL DEPARTMENT: Plastic Surgery  PREOPERATIVE DIAGNOSES: Right scrotal wound  POSTOPERATIVE DIAGNOSES:  same  PROCEDURE: 1.  Surgical preparation for grafting of the right scrotal wound 12 x 10 cm 2.  Application of fenestrated prime matrix AG mesh 8 x 8 cm  SURGEON: Ancil Linsey, MD  ASSISTANT: Legrand Rams, MD  ANESTHESIA:  General.   COMPLICATIONS: None.   INDICATIONS FOR PROCEDURE:  The patient, Martin Mullen is a 39 y.o. male born on 30-Mar-1980, is here for treatment of right scrotal wound.  This is from a debridement for Fournier's gangrene.  He has been improving clinically and were planning a temporary closure to facilitate wound care and ultimately make a definitive reconstruction easier.  I felt it was too early to do a definitive closure so this is an intermediate step. MRN: [id number]  CONSENT:  Informed consent was obtained directly from the patient. Risks, benefits and alternatives were fully discussed. Specific risks including but not limited to bleeding, infection, hematoma, seroma, scarring, pain, contracture, asymmetry, wound healing problems, and need for further surgery were all discussed. The patient did have an ample opportunity to have questions answered to satisfaction.   DESCRIPTION OF PROCEDURE:  The patient was taken to the operating room. SCDs were placed and antibiotics were given.  General anesthesia was administered.  The patient's operative site was prepped and draped in a sterile fashion. A time out was performed and all information was confirmed to be correct.  Started by irrigating the wound.  Around a liter of saline was used.  The wound overall looked very clean and no evidence of purulence was seen.  All the remaining tissue looked viable.  The majority of the right hemiscrotum was debrided previously.  His right testicle was dangling out of the wound quite a ways.  I did a gentle blunt  debridement of the wound surface to freshen up the wound bed.  An 8 x 8 cm piece of fenestrated prime matrix AG mesh was brought onto the field and soaked in saline.  It was secured over the wound with 3-0 Vicryl sutures.  This kept the right testicle secured in place and significantly decrease the size of the overall wound.  A piece of Mepitel was then sewn over this with 3-0 Vicryl sutures.  Dressing was then applied with K-Y jelly, 4 x 4's, ABDs and mesh underwear.  The patient tolerated the procedure well.  There were no complications. The patient was allowed to wake from anesthesia, extubated and taken to the recovery room in satisfactory condition.

## 2019-07-25 NOTE — Progress Notes (Signed)
Patient declined ordered dressing change.  RN educated patient regarding importance of compliance with ordered changes.  Patient verbalized changes were being done first thing in the mornings and preferred not to have it done tonight.  RN reiterated need for dressing to be changed a second time, Patient declined second time.

## 2019-07-25 NOTE — Interval H&P Note (Signed)
History and Physical Interval Note:  07/25/2019 4:55 PM  Martin Mullen  has presented today for surgery, with the diagnosis of scrotal wound.  The various methods of treatment have been discussed with the patient and family. After consideration of risks, benefits and other options for treatment, the patient has consented to  Procedure(s): DEBRIDEMENT AND CLOSURE WOUND (N/A) as a surgical intervention.  The patient's history has been reviewed, patient examined, no change in status, stable for surgery.  I have reviewed the patient's chart and labs.  Questions were answered to the patient's satisfaction.     Allena Napoleon

## 2019-07-25 NOTE — Brief Op Note (Signed)
07/25/2019  6:35 PM  PATIENT:  [name]  39 y.o. male  PRE-OPERATIVE DIAGNOSIS:  scrotal wound  POST-OPERATIVE DIAGNOSIS:  scrotal wound  PROCEDURE:  Procedure(s): DEBRIDEMENT AND CLOSURE WOUND (N/A)  SURGEON:  Surgeon(s) and Role:    * Jessica Checketts, Wendy Poet, MD - Primary    * Sninsky, Laurette Schimke, MD - Assisting  PHYSICIAN ASSISTANT: None  ASSISTANTS: sninsky, brian  ANESTHESIA:   general  EBL:  10   BLOOD ADMINISTERED:none  DRAINS: none   LOCAL MEDICATIONS USED:  MARCAINE     SPECIMEN:  No Specimen  DISPOSITION OF SPECIMEN:  N/A  COUNTS:  YES  TOURNIQUET:  * No tourniquets in log *  DICTATION: .Dragon Dictation  PLAN OF CARE: Inpatient  PATIENT DISPOSITION:  PACU - hemodynamically stable.   Delay start of Pharmacological VTE agent (>24hrs) due to surgical blood loss or risk of bleeding: not applicable

## 2019-07-25 NOTE — Progress Notes (Signed)
Patient status post debridement and temporary closure of right scrotal wound with biologic mesh.  His wound care instruction should be as follows: Remove the dressing once a day.  Gently clean around the wound with saline moistened gauze.  Apply K-Y jelly to the mesh surface and then reapply 4 x 4's, ABDs and mesh underwear.  This can be done more than once a day if needed.  Would avoid showering until his follow-up visit with me.  I would like to see him in around 1 week after discharge in clinic.  I recommend him limiting his activities much as possible to prevent shearing or tension on the wound.

## 2019-07-25 NOTE — Progress Notes (Signed)
   Subjective Afebrile/HDS o/n Refused PM dressing change  Physical Exam: BP 120/74 (BP Location: Right Arm)   Pulse 80   Temp 98.3 F (36.8 C) (Oral)   Resp 19   Ht 5\' 10"  (1.778 m)   Wt 113.4 kg   SpO2 95%   BMI 35.87 kg/m    Constitutional: Alert and oriented, No acute distress. Respiratory: Normal respiratory effort, no increased work of breathing. GI: Abdomen is soft, non-tender, non-distended hemiscrotum resected, wound edges pink and healthy-appearing, no active bleeding, no area of dusky or necrotic tissue, no spreading erythema, right testicle is exposed, pink, and viable appearing. Wound re-packed with W2D kerlix and ABD Drains:Foley with clear yellow urine  Laboratory Data: Reviewed  Assessment & Plan:   39 year old male with diabetesPOD#44from right hemiscrotum resection for Fournier's gangrene. Wound appears clean with no evidence of residual necrotic tissue this morning.   OR this afternoon with Dr. 4f from plastic surgery for testicle coverage/pouch and partial closure. Anticipate he could be discharged Friday with outpatient wound care.  Maintain foley Continue broad-spectrum antibiotics Continue tight diabetes control OR this afternoon for partial closure with plastic surgery, NPO  I spent 15 total minutes on the floor and greater than 50% was spent in counseling coordination of care with the patient.  Dressing was changed, and we discussed wound care options moving forward at length.  Monday, MD

## 2019-07-25 NOTE — Consult Note (Signed)
Reason for Consult/CC: Scrotal wound  Martin Mullen is an 39 y.o. male.  HPI: I was consulted for assistance with management of scrotal wound.  Patient had Fournier's gangrene debridement by urology.  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  Allergies: No Known Allergies  Medications:  Current Facility-Administered Medications:  .  0.9 %  sodium chloride infusion, , Intravenous, Continuous, Sreenath, Sudheer B, MD, Last Rate: 125 mL/hr at 07/25/19 1159, New Bag at 07/25/19 1159 .  0.9 %  sodium chloride infusion, , Intravenous, PRN, Ivor Costa, MD, Stopped at 07/24/19 1240 .  acetaminophen (TYLENOL) tablet 650 mg, 650 mg, Oral, Q6H PRN, Ivor Costa, MD, 650 mg at 07/21/19 0129 .  amLODipine (NORVASC) tablet 5 mg, 5 mg, Oral, Daily, Sreenath, Sudheer B, MD, 5 mg at 07/24/19 0927 .  aspirin EC tablet 81 mg, 81 mg, Oral, Daily, Ivor Costa, MD, 81 mg at 07/24/19 0928 .  atorvastatin (LIPITOR) tablet 40 mg, 40 mg, Oral, Daily, Ivor Costa, MD, 40 mg at 07/24/19 0927 .  Chlorhexidine Gluconate Cloth 2 % PADS 6 each, 6 each, Topical, Daily, Ivor Costa, MD, 6 each at 07/25/19 1150 .  enoxaparin (LOVENOX) injection 40 mg, 40 mg, Subcutaneous, Daily, Marton Redwood III, MD, 40 mg at 07/25/19 1024 .  hydrALAZINE (APRESOLINE) injection 5 mg, 5 mg, Intravenous, Q2H PRN, Ivor Costa, MD .  insulin aspart (novoLOG) injection 0-15 Units, 0-15 Units, Subcutaneous, TID WC, Ralene Muskrat B, MD, 3 Units at 07/25/19 1156 .  insulin aspart (novoLOG) injection 0-5 Units, 0-5 Units, Subcutaneous, QHS, Ralene Muskrat B, MD, 2 Units at 07/23/19 2158 .  insulin aspart (novoLOG) injection 10 Units, 10 Units, Subcutaneous, TID WC, Loletha Grayer, MD, 10 Units at 07/24/19 1727 .  insulin glargine (LANTUS) injection 23 Units, 23 Units, Subcutaneous, QHS, Dallie Piles, RPH .  linezolid (ZYVOX) IVPB 600 mg, 600 mg, Intravenous, Q12H, Sreenath, Sudheer B, MD, Last Rate: 300 mL/hr at 07/25/19 1019,  600 mg at 07/25/19 1019 .  morphine 2 MG/ML injection 2 mg, 2 mg, Intravenous, Q4H PRN, Ivor Costa, MD, 2 mg at 07/25/19 0815 .  nicotine (NICODERM CQ - dosed in mg/24 hours) patch 21 mg, 21 mg, Transdermal, Daily, Ivor Costa, MD .  nitroGLYCERIN (NITROSTAT) SL tablet 0.4 mg, 0.4 mg, Sublingual, Q5 min PRN, Ivor Costa, MD .  ondansetron [facility]) injection 4 mg, 4 mg, Intravenous, Q8H PRN, Ivor Costa, MD .  oxyCODONE-acetaminophen (PERCOCET/ROXICET) 5-325 MG per tablet 1 tablet, 1 tablet, Oral, Q4H PRN, Ivor Costa, MD, 1 tablet at 07/25/19 1200 .  pantoprazole (PROTONIX) EC tablet 40 mg, 40 mg, Oral, Daily, Ivor Costa, MD, 40 mg at 07/24/19 0927 .  piperacillin-tazobactam (ZOSYN) IVPB 3.375 g, 3.375 g, Intravenous, Q8H, Ivor Costa, MD, Last Rate: 12.5 mL/hr at 07/25/19 1515, 3.375 g at 07/25/19 1515 .  sodium chloride 0.9 % bolus 2,000 mL, 2,000 mL, Intravenous, Once, Ivor Costa, MD .  sodium chloride flush (NS) 0.9 % injection 10-40 mL, 10-40 mL, Intracatheter, Q12H, Sreenath, Sudheer B, MD, 10 mL at 07/24/19 0928 .  sodium chloride flush (NS) 0.9 % injection 10-40 mL, 10-40 mL, Intracatheter, PRN, Sidney Ace, MD  Past Medical History:  Diagnosis Date  . Acute transmural inferior wall MI ([place]) 08/28/2012   .5 x 12 mm Veri-flex stent non-DES.  Marland Kitchen Chicken pox   . Coronary artery disease    Inferior ST elevation myocardial infarction in July of 2014. Cardiac catheterization showed 95% distal RCA stenosis  and 90% first diagonal stenosis with normal ejection fraction. He underwent PCI in bare-metal stent placement to the distal RCA.  . Diabetes mellitus without complication (HCC)   . Hypercholesterolemia   . Tobacco use     Past Surgical History:  Procedure Laterality Date  . CORONARY ANGIOPLASTY WITH STENT PLACEMENT    . INCISION AND DRAINAGE ABSCESS N/A 07/20/2019   Procedure: INCISION AND DRAINAGE ABSCESS;  Surgeon: Crista Elliot, MD;  Location: [facility] ORS;  Service: Urology;   Laterality: N/A;  . LEFT HEART CATHETERIZATION WITH CORONARY ANGIOGRAM N/A 08/28/2012   Procedure: LEFT HEART CATHETERIZATION WITH CORONARY ANGIOGRAM;  Surgeon: Pamella Pert, MD;  Location: [facility] CATH LAB;  Service: Cardiovascular;  Laterality: N/A;    Family History  Problem Relation Age of Onset  . Arthritis Mother        Rheumatoid  . Diabetes Mother   . Hyperlipidemia Mother   . Diabetes Father   . Cancer Maternal Grandfather        Prostate  . Parkinson's disease Maternal Grandfather     Social History:  reports that he has been smoking cigarettes. He has a 30.00 pack-year smoking history. He has never used smokeless tobacco. He reports current alcohol use. He reports current drug use. Drug: Marijuana.  Physical Exam Blood pressure (!) 160/76, pulse 66, temperature (!) 97.4 F (36.3 C), temperature source Temporal, resp. rate 14, height 5\' 10"  (1.778 m), weight 113.4 kg, SpO2 96 %. General: Examination shows healthy granulation tissue for the most part over exposed right testicle and open right hemiscrotum.  No obvious additional fluctuance or purulence.  Results for orders placed or performed during the hospital encounter of 07/20/19 (from the past 48 hour(s))  Glucose, capillary     Status: Abnormal   Collection Time: 07/23/19  5:03 PM  Result Value Ref Range   Glucose-Capillary 125 (H) 70 - 99 mg/dL    Comment: Glucose reference range applies only to samples taken after fasting for at least 8 hours.  Glucose, capillary     Status: Abnormal   Collection Time: 07/23/19  9:53 PM  Result Value Ref Range   Glucose-Capillary 225 (H) 70 - 99 mg/dL    Comment: Glucose reference range applies only to samples taken after fasting for at least 8 hours.   Comment 1 Notify RN   Glucose, capillary     Status: Abnormal   Collection Time: 07/24/19  7:53 AM  Result Value Ref Range   Glucose-Capillary 138 (H) 70 - 99 mg/dL    Comment: Glucose reference range applies only to samples  taken after fasting for at least 8 hours.  CBC with Differential/Platelet     Status: Abnormal   Collection Time: 07/24/19 12:00 PM  Result Value Ref Range   WBC 14.0 (H) 4.0 - 10.5 K/uL   RBC 3.36 (L) 4.22 - 5.81 MIL/uL   Hemoglobin 11.0 (L) 13.0 - 17.0 g/dL   HCT 09/23/19 (L) 39 - 52 %   MCV 94.9 80.0 - 100.0 fL   MCH 32.7 26.0 - 34.0 pg   MCHC 34.5 30.0 - 36.0 g/dL   RDW 76.1 60.7 - 37.1 %   Platelets 244 150 - 400 K/uL   nRBC 0.0 0.0 - 0.2 %   Neutrophils Relative % 67 %   Neutro Abs 9.5 (H) 1.7 - 7.7 K/uL   Lymphocytes Relative 20 %   Lymphs Abs 2.8 0.7 - 4.0 K/uL   Monocytes Relative 9 %  Monocytes Absolute 1.3 (H) 0 - 1 K/uL   Eosinophils Relative 3 %   Eosinophils Absolute 0.4 0 - 0 K/uL   Basophils Relative 1 %   Basophils Absolute 0.1 0 - 0 K/uL   WBC Morphology MORPHOLOGY UNREMARKABLE    RBC Morphology MORPHOLOGY UNREMARKABLE    Smear Review Normal platelet morphology    Immature Granulocytes 0 %   Abs Immature Granulocytes 0.06 0.00 - 0.07 K/uL    Comment: Performed at [facility], [address], [place], [place] [address]  Basic metabolic panel     Status: Abnormal   Collection Time: 07/24/19 12:00 PM  Result Value Ref Range   Sodium 141 135 - 145 mmol/L   Potassium 3.4 (L) 3.5 - 5.1 mmol/L   Chloride 111 98 - 111 mmol/L   CO2 25 22 - 32 mmol/L   Glucose, Bld 171 (H) 70 - 99 mg/dL    Comment: Glucose reference range applies only to samples taken after fasting for at least 8 hours.   BUN 10 6 - 20 mg/dL   Creatinine, Ser 7.82 (H) 0.61 - 1.24 mg/dL   Calcium 7.6 (L) 8.9 - 10.3 mg/dL   GFR calc non Af Amer 52 (L) >60 mL/min   GFR calc Af Amer >60 >60 mL/min   Anion gap 5 5 - 15    Comment: Performed at [facility], [address] [place], [place] [address]  Glucose, capillary     Status: Abnormal   Collection Time: 07/24/19 12:08 PM  Result Value Ref Range   Glucose-Capillary 166 (H) 70 - 99 mg/dL    Comment: Glucose reference range  applies only to samples taken after fasting for at least 8 hours.  Glucose, capillary     Status: Abnormal   Collection Time: 07/24/19  4:30 PM  Result Value Ref Range   Glucose-Capillary 152 (H) 70 - 99 mg/dL    Comment: Glucose reference range applies only to samples taken after fasting for at least 8 hours.  Glucose, capillary     Status: Abnormal   Collection Time: 07/24/19  9:27 PM  Result Value Ref Range   Glucose-Capillary 189 (H) 70 - 99 mg/dL    Comment: Glucose reference range applies only to samples taken after fasting for at least 8 hours.  Basic metabolic panel     Status: Abnormal   Collection Time: 07/25/19  6:12 AM  Result Value Ref Range   Sodium 143 135 - 145 mmol/L   Potassium 4.1 3.5 - 5.1 mmol/L   Chloride 113 (H) 98 - 111 mmol/L   CO2 21 (L) 22 - 32 mmol/L   Glucose, Bld 243 (H) 70 - 99 mg/dL    Comment: Glucose reference range applies only to samples taken after fasting for at least 8 hours.   BUN 11 6 - 20 mg/dL   Creatinine, Ser 3.08 (H) 0.61 - 1.24 mg/dL   Calcium 8.1 (L) 8.9 - 10.3 mg/dL   GFR calc non Af Amer 52 (L) >60 mL/min   GFR calc Af Amer >60 >60 mL/min   Anion gap 9 5 - 15    Comment: Performed at [facility], [address] [place], [place] [address]  Glucose, capillary     Status: Abnormal   Collection Time: 07/25/19  7:46 AM  Result Value Ref Range   Glucose-Capillary 238 (H) 70 - 99 mg/dL    Comment: Glucose reference range applies only to samples taken after fasting for  at least 8 hours.  Glucose, capillary     Status: Abnormal   Collection Time: 07/25/19 11:51 AM  Result Value Ref Range   Glucose-Capillary 196 (H) 70 - 99 mg/dL    Comment: Glucose reference range applies only to samples taken after fasting for at least 8 hours.  Glucose, capillary     Status: Abnormal   Collection Time: 07/25/19  4:24 PM  Result Value Ref Range   Glucose-Capillary 124 (H) 70 - 99 mg/dL    Comment: Glucose reference range applies only to  samples taken after fasting for at least 8 hours.    No results found.  Assessment/Plan: I had a long discussion with the patient about his challenging situation.  I think it is too early to try to close this definitively given the proximity to his recent debridement.  In order to temporize this and make the wound care easier we discussed selling a pry matrix mesh to bridge the gap and keep the testicle contained while the healing process continues.  There then continues to look clean he may be candidate for delayed closure using adjacent scrotal skin.  Alternatively a skin graft may be necessary.  He is in agreement with this plan.  I explained he would almost certainly require another surgical procedure in the future and is fine with that.  I explained that blood sugar blood sugar control would be critical to his overall outcome and he seems to understand that.  We discussed the risk of the procedure that include bleeding, infection, damage to surrounding structures, need for additional procedures.  Is fully understanding and will proceed today with temporary closure using biologic mesh.  Allena Napoleon 07/25/2019, 4:56 PM

## 2019-07-25 NOTE — Progress Notes (Signed)
Inpatient Diabetes Program Recommendations  AACE/ADA: New Consensus Statement on Inpatient Glycemic Control   Target Ranges:  Prepandial:   less than 140 mg/dL      Peak postprandial:   less than 180 mg/dL (1-2 hours)      Critically ill patients:  140 - 180 mg/dL   Results for Martin Mullen, Martin Mullen (MRN [id number]) as of 07/25/2019 08:15  Ref. Range 07/24/2019 07:53 07/24/2019 12:08 07/24/2019 16:30 07/24/2019 21:27 07/25/2019 07:46  Glucose-Capillary Latest Ref Range: 70 - 99 mg/dL 991 (H) 444 (H) 584 (H) 189 (H) 238 (H)   Review of Glycemic Control  Diabetes history: DM2 Outpatient Diabetes medications: Lantus 23 units daily, Glipizide 10 mg BID, Metformin 1000 mg BID, Januvia 100 mg daily Current orders for Inpatient glycemic control: Lantus 12 units QHS, Novolog 10 units TID with meals, Novolog 0-15 units TID with meals, Novolog 0-5 units QHS  Inpatient Diabetes Program Recommendations:    Insulin-Basal: Noted Lantus decreased from 23 units to 12 units QHS on 07/24/19 and patient is NPO today since he will have procedure in OR today. After procedure, please consider increasing Lantus to 23 units QHS.   Thanks, Orlando Penner, RN, MSN, CDE Diabetes Coordinator Inpatient Diabetes Program [phone] (Team Pager from 8am to 5pm)

## 2019-07-25 NOTE — Progress Notes (Signed)
Patient ID: Martin Mullen, male   DOB: 1980/03/17, 39 y.o.   MRN: [id number] [facility] PROGRESS NOTE  Martin Mullen JSH:[id number] DOB: 05-04-1980 DOA: 07/20/2019 PCP: [name], NP  HPI/Subjective: Patient to go to the operating room today for relook and closure of his scrotum.  Patient does have some pain with movement but otherwise okay at rest.  Feeling okay.  Had a bowel movement yesterday.  Objective: Vitals:   07/25/19 0945 07/25/19 1157  BP: (!) 160/84 (!) 149/122  Pulse: 70 72  Resp: 14 16  Temp: 98.1 F (36.7 C) 97.7 F (36.5 C)  SpO2: 99% 98%    Intake/Output Summary (Last 24 hours) at 07/25/2019 1554 Last data filed at 07/25/2019 1037 Gross per 24 hour  Intake 378 ml  Output 5150 ml  Net -4772 ml   Filed Weights   07/20/19 0943  Weight: 113.4 kg    ROS: Review of Systems  Constitutional: Negative for fever.  Eyes: Negative for blurred vision.  Respiratory: Negative for cough and shortness of breath.   Cardiovascular: Negative for chest pain.  Gastrointestinal: Negative for abdominal pain, constipation, diarrhea, nausea and vomiting.  Musculoskeletal: Positive for joint pain.  Neurological: Negative for dizziness.   Exam: Physical Exam  Constitutional: He is oriented to person, place, and time.  HENT:  Nose: No mucosal edema.  Mouth/Throat: No oropharyngeal exudate.  Eyes: Conjunctivae and lids are normal.  Neck: Carotid bruit is not present.  Cardiovascular: S1 normal and S2 normal. Exam reveals no gallop.  No murmur heard. Respiratory: No respiratory distress. He has no wheezes. He has no rhonchi. He has no rales.  GI: Soft. Bowel sounds are normal. There is no abdominal tenderness.  Musculoskeletal:     Right ankle: No swelling.     Left ankle: No swelling.  Lymphadenopathy:    He has no cervical adenopathy.  Neurological: He is alert and oriented to person, place, and time. No cranial nerve deficit.  Skin: Skin is warm. Nails show no  clubbing.      Data Reviewed: Basic Metabolic Panel: Recent Labs  Lab 07/21/19 0641 07/21/19 0641 07/22/19 0500 07/22/19 0535 07/23/19 0752 07/24/19 1200 07/25/19 0612  NA 133*  --  136  --  142 141 143  K 3.6  --  3.6  --  3.6 3.4* 4.1  CL 101  --  106  --  113* 111 113*  CO2 23  --  22  --  23 25 21*  GLUCOSE 285*  --  251*  --  163* 171* 243*  BUN 18  --  22*  --  17 10 11   CREATININE 1.60*   < > 1.93* 1.97* 1.69* 1.64* 1.64*  CALCIUM 7.5*  --  7.5*  --  7.4* 7.6* 8.1*   < > = values in this interval not displayed.   Liver Function Tests: Recent Labs  Lab 07/20/19 1005  AST 16  ALT 21  ALKPHOS 91  BILITOT 1.6*  PROT 7.0  ALBUMIN 3.4*   CBC: Recent Labs  Lab 07/20/19 1059 07/21/19 0641 07/22/19 0500 07/23/19 0752 07/24/19 1200  WBC 30.9* 21.2* 19.0* 15.7* 14.0*  NEUTROABS 26.8*  --  14.9* 12.0* 9.5*  HGB 15.0 12.4* 11.5* 11.2* 11.0*  HCT 41.4 35.1* 33.3* 32.4* 31.9*  MCV 91.0 91.6 94.9 94.7 94.9  PLT 193 164 202 240 244    CBG: Recent Labs  Lab 07/24/19 1208 07/24/19 1630 07/24/19 2127 07/25/19 0746 07/25/19 1151  GLUCAP 166*  152* 189* 238* 196*    Recent Results (from the past 240 hour(s))  SARS Coronavirus 2 by RT PCR (hospital order, performed in [facility] hospital lab) Nasopharyngeal Nasopharyngeal Swab     Status: None   Collection Time: 07/20/19  2:08 PM   Specimen: Nasopharyngeal Swab  Result Value Ref Range Status   SARS Coronavirus 2 NEGATIVE NEGATIVE Final    Comment: (NOTE) SARS-CoV-2 target nucleic acids are NOT DETECTED. The SARS-CoV-2 RNA is generally detectable in upper and lower respiratory specimens during the acute phase of infection. The lowest concentration of SARS-CoV-2 viral copies this assay can detect is 250 copies / mL. A negative result does not preclude SARS-CoV-2 infection and should not be used as the sole basis for treatment or other patient management decisions.  A negative result may occur with improper  specimen collection / handling, submission of specimen other than nasopharyngeal swab, presence of viral mutation(s) within the areas targeted by this assay, and inadequate number of viral copies (<250 copies / mL). A negative result must be combined with clinical observations, patient history, and epidemiological information. Fact Sheet for Patients:   BoilerBrush.com.cy Fact Sheet for Healthcare Providers: https://pope.com/ This test is not yet approved or cleared  by the [place] FDA and has been authorized for detection and/or diagnosis of SARS-CoV-2 by FDA under an Emergency Use Authorization (EUA).  This EUA will remain in effect (meaning this test can be used) for the duration of the COVID-19 declaration under Section 564(b)(1) of the Act, 21 U.S.C. section 360bbb-3(b)(1), unless the authorization is terminated or revoked sooner. Performed at [facility], [address] [place], [place] [address]   Aerobic/Anaerobic Culture (surgical/deep wound)     Status: None   Collection Time: 07/20/19  4:13 PM   Specimen: PATH Other; Wound  Result Value Ref Range Status   Specimen Description   Final    WOUND Performed at [facility], [address] [place], [place] [address]    Special Requests   Final    NONE Performed at [facility], [address] [place], [place] [address]    Gram Stain   Final    FEW WBC PRESENT, PREDOMINANTLY PMN MODERATE GRAM POSITIVE COCCI MODERATE GRAM NEGATIVE RODS Performed at [facility], [address] [place], [place] [address]    Culture   Final    MIXED ANAEROBIC FLORA PRESENT.  CALL LAB IF FURTHER IID REQUIRED.   Report Status 07/24/2019 FINAL  Final  CULTURE, BLOOD (ROUTINE X 2) w Reflex to ID Panel     Status: None   Collection Time: 07/20/19  6:51 PM   Specimen: BLOOD  Result Value Ref Range Status   Specimen Description BLOOD BLOOD RIGHT HAND   Final   Special Requests   Final    BOTTLES DRAWN AEROBIC AND ANAEROBIC Blood Culture adequate volume   Culture   Final    NO GROWTH 5 DAYS Performed at [facility], [address] [place], [place] [address]    Report Status 07/25/2019 FINAL  Final  CULTURE, BLOOD (ROUTINE X 2) w Reflex to ID Panel     Status: None   Collection Time: 07/20/19  7:52 PM   Specimen: BLOOD  Result Value Ref Range Status   Specimen Description BLOOD BLOOD RIGHT HAND  Final   Special Requests   Final    BOTTLES DRAWN AEROBIC ONLY Blood Culture adequate volume   Culture   Final  NO GROWTH 5 DAYS Performed at [facility], [address] [place], [place] [address]    Report Status 07/25/2019 FINAL  Final     Scheduled Meds: . amLODipine  5 mg Oral Daily  . aspirin EC  81 mg Oral Daily  . atorvastatin  40 mg Oral Daily  . Chlorhexidine Gluconate Cloth  6 each Topical Daily  . enoxaparin (LOVENOX) injection  40 mg Subcutaneous Daily  . insulin aspart  0-15 Units Subcutaneous TID WC  . insulin aspart  0-5 Units Subcutaneous QHS  . insulin aspart  10 Units Subcutaneous TID WC  . insulin glargine  23 Units Subcutaneous QHS  . nicotine  21 mg Transdermal Daily  . pantoprazole  40 mg Oral Daily  . sodium chloride flush  10-40 mL Intracatheter Q12H   Continuous Infusions: . sodium chloride 125 mL/hr at 07/25/19 1159  . sodium chloride Stopped (07/24/19 1240)  . linezolid (ZYVOX) IV 600 mg (07/25/19 1019)  . piperacillin-tazobactam (ZOSYN)  IV 3.375 g (07/25/19 1515)  . sodium chloride      Assessment/Plan:  1. Sepsis secondary to Fournier's gangrene.  Present on admission.  Scrotal debridement and exploration done on 07/20/2019.  Patient will have to go back to the operating today for relook and closure of the scrotum.  Patient on IV Zosyn and Zyvox at this point.  Pain control.  Potentially will have to use doxycycline and Augmentin as outpatient. 2. Type 2 diabetes mellitus on  glargine insulin and sliding scale.  Last night given half dose of insulin because he was going to be n.p.o. all day.  Go back to full dose of insulin tonight. 3. Essential hypertension on Norvasc 4. Hyperlipidemia unspecified on atorvastatin 5. Tobacco abuse on nicotine patch 6. GERD on PPI   Code Status:     Code Status Orders  (From admission, onward)         Start     Ordered   07/20/19 1522  Full code  Continuous     07/20/19 1521        Code Status History    Date Active Date Inactive Code Status Order ID Comments User Context   03/18/2017 2306 03/21/2017 1709 Full Code [id number]  Amelia Jo, MD Inpatient   03/05/2015 1616 03/06/2015 1834 Full Code [id number]  Aldean Jewett, MD Inpatient   08/28/2012 1038 08/30/2012 1429 Full Code [id number]  Laverda Page, MD Inpatient   Advance Care Planning Activity     Disposition Plan: Status is: Inpatient  Dispo: The patient is from: Home              Anticipated d/c is to: Home              Anticipated d/c date is: Potentially 07/26/2019 if cleared by urology to do so              Patient currently receiving IV antibiotics.  Urology would like to do a closure procedure today.  Consultants:  Urology  Procedures:  Scrotal debridement and exploration  Antibiotics:  Zyvox  Zosyn  Time spent: 26 minutes  Azelea Seguin [employer]

## 2019-07-25 NOTE — Anesthesia Preprocedure Evaluation (Signed)
Anesthesia Evaluation  Patient identified by MRN, date of birth, ID band Patient awake    Reviewed: Allergy & Precautions, H&P , NPO status , Patient's Chart, lab work & pertinent test results  History of Anesthesia Complications Negative for: history of anesthetic complications  Airway Mallampati: III       Dental  (+) Dental Advidsory Given   Pulmonary neg shortness of breath, neg COPD, neg recent URI, Current Smoker,    breath sounds clear to auscultation       Cardiovascular hypertension, (-) angina+ CAD, + Past MI and + Cardiac Stents (2014)  (-) CABG (-) dysrhythmias (-) Valvular Problems/Murmurs Rhythm:regular     Neuro/Psych negative neurological ROS  negative psych ROS   GI/Hepatic Neg liver ROS, GERD  ,  Endo/Other  diabetes, Poorly Controlled  Renal/GU      Musculoskeletal   Abdominal   Peds  Hematology negative hematology ROS (+)   Anesthesia Other Findings Sepsis  Past Medical History: 08/28/2012: Acute transmural inferior wall MI ([facility])     Comment:  .5 x 12 mm Veri-flex stent non-DES. No date: Chicken pox No date: Coronary artery disease     Comment:  Inferior ST elevation myocardial infarction in July of               2014. Cardiac catheterization showed 95% distal RCA               stenosis and 90% first diagonal stenosis with normal               ejection fraction. He underwent PCI in bare-metal stent               placement to the distal RCA. No date: Diabetes mellitus without complication (HCC) No date: Hypercholesterolemia No date: Tobacco use  Past Surgical History: No date: CORONARY ANGIOPLASTY WITH STENT PLACEMENT 08/28/2012: LEFT HEART CATHETERIZATION WITH CORONARY ANGIOGRAM; N/A     Comment:  Procedure: LEFT HEART CATHETERIZATION WITH CORONARY               ANGIOGRAM;  Surgeon: Pamella Pert, MD;  Location: [facility]              CATH LAB;  Service: Cardiovascular;  Laterality:  N/A;  BMI    Body Mass Index: 35.87 kg/m      Reproductive/Obstetrics negative OB ROS                             Anesthesia Physical  Anesthesia Plan  ASA: III  Anesthesia Plan: General   Post-op Pain Management:    Induction: Intravenous  PONV Risk Score and Plan: Ondansetron, Dexamethasone, Treatment may vary due to age or medical condition and Midazolam  Airway Management Planned: LMA  Additional Equipment:   Intra-op Plan:   Post-operative Plan: Extubation in OR  Informed Consent: I have reviewed the patients History and Physical, chart, labs and discussed the procedure including the risks, benefits and alternatives for the proposed anesthesia with the patient or authorized representative who has indicated his/her understanding and acceptance.     Dental Advisory Given  Plan Discussed with: Anesthesiologist, CRNA and Surgeon  Anesthesia Plan Comments:         Anesthesia Quick Evaluation

## 2019-07-25 NOTE — Transfer of Care (Signed)
Immediate Anesthesia Transfer of Care Note  Patient: Martin Mullen  Procedure(s) Performed: DEBRIDEMENT AND CLOSURE WOUND (N/A )  Patient Location: PACU  Anesthesia Type:General  Level of Consciousness: drowsy  Airway & Oxygen Therapy: Patient Spontanous Breathing and Patient connected to face mask oxygen  Post-op Assessment: Report given to RN and Post -op Vital signs reviewed and stable  Post vital signs: Reviewed and stable  Last Vitals:  Vitals Value Taken Time  BP 137/84 07/25/19 1834  Temp    Pulse 68 07/25/19 1842  Resp 15 07/25/19 1842  SpO2 98 % 07/25/19 1842  Vitals shown include unvalidated device data.  Last Pain:  Vitals:   07/25/19 1653  TempSrc: Temporal  PainSc: 4       Patients Stated Pain Goal: 0 (07/23/19 2200)  Complications: No complications documented.

## 2019-07-26 ENCOUNTER — Encounter: Payer: Self-pay | Admitting: Plastic Surgery

## 2019-07-26 DIAGNOSIS — N1831 Chronic kidney disease, stage 3a: Secondary | ICD-10-CM

## 2019-07-26 LAB — GLUCOSE, CAPILLARY
Glucose-Capillary: 208 mg/dL — ABNORMAL HIGH (ref 70–99)
Glucose-Capillary: 298 mg/dL — ABNORMAL HIGH (ref 70–99)

## 2019-07-26 MED ORDER — NICOTINE 21 MG/24HR TD PT24: MEDICATED_PATCH | TRANSDERMAL | 0 refills | Status: DC

## 2019-07-26 MED ORDER — OXYCODONE-ACETAMINOPHEN 5-325 MG PO TABS: 1.0000 | ORAL_TABLET | Freq: Four times a day (QID) | ORAL | 0 refills | Status: DC | PRN

## 2019-07-26 MED ORDER — AMOXICILLIN-POT CLAVULANATE 875-125 MG PO TABS: 1.0000 | ORAL_TABLET | Freq: Two times a day (BID) | ORAL | 0 refills | Status: AC

## 2019-07-26 MED ORDER — LYUMJEV KWIKPEN 100 UNIT/ML ~~LOC~~ SOPN: 6.0000 [IU] | PEN_INJECTOR | Freq: Three times a day (TID) | SUBCUTANEOUS | 0 refills | Status: DC

## 2019-07-26 NOTE — Progress Notes (Addendum)
Urology Inpatient Progress Note  Subjective: Martin Mullen is a 39 y.o. male with PMH uncontrolled diabetes and recurrent scrotal infections admitted on 07/20/2019 with Fournier's gangrene.  He is POD 6 from scrotal exploration with debridement with Dr. Gloriann Loan and POD 1 from surgical perforation for grafting of right scrotal wound with application of fenestrated prime matrix AG mesh with Dr. Claudia Desanctis.   No acute concerns today, he remains afebrile and VSS.  Anti-infectives: Anti-infectives (From admission, onward)   Start     Dose/Rate Route Frequency Ordered Stop   07/22/19 1300  linezolid (ZYVOX) IVPB 600 mg     Discontinue     600 mg 300 mL/hr over 60 Minutes Intravenous Every 12 hours 07/22/19 1212     07/21/19 2046  vancomycin (VANCOREADY) IVPB 750 mg/150 mL  Status:  Discontinued        750 mg 150 mL/hr over 60 Minutes Intravenous Every 12 hours 07/21/19 1156 07/22/19 1212   07/21/19 0000  vancomycin (VANCOREADY) IVPB 750 mg/150 mL  Status:  Discontinued        750 mg 150 mL/hr over 60 Minutes Intravenous Every 8 hours 07/20/19 1613 07/21/19 1156   07/20/19 1730  clindamycin (CLEOCIN) IVPB 600 mg  Status:  Discontinued        600 mg 100 mL/hr over 30 Minutes Intravenous Every 8 hours 07/20/19 1726 07/20/19 1745   07/20/19 1615  vancomycin (VANCOCIN) 2,500 mg in sodium chloride 0.9 % 500 mL IVPB  Status:  Discontinued        2,500 mg 250 mL/hr over 120 Minutes Intravenous  Once 07/20/19 1600 07/20/19 1810   07/20/19 1615  piperacillin-tazobactam (ZOSYN) IVPB 3.375 g     Discontinue     3.375 g 12.5 mL/hr over 240 Minutes Intravenous Every 8 hours 07/20/19 1606     07/20/19 1615  vancomycin (VANCOREADY) IVPB 2000 mg/400 mL  Status:  Discontinued       "Followed by" Linked Group Details   2,000 mg 200 mL/hr over 120 Minutes Intravenous  Once 07/20/19 1613 07/20/19 2152   07/20/19 1615  vancomycin (VANCOREADY) IVPB 500 mg/100 mL  Status:  Discontinued       "Followed by" Linked Group  Details   500 mg 100 mL/hr over 60 Minutes Intravenous  Once 07/20/19 1613 07/20/19 2152   07/20/19 1558  piperacillin-tazobactam (ZOSYN) 3.375 GM/50ML IVPB       Note to Pharmacy: Huffines, Mindy   : cabinet override      07/20/19 1558 07/20/19 1603   07/20/19 1345  vancomycin (VANCOCIN) IVPB 1000 mg/200 mL premix  Status:  Discontinued        1,000 mg 200 mL/hr over 60 Minutes Intravenous  Once 07/20/19 1331 07/20/19 1600   07/20/19 1345  piperacillin-tazobactam (ZOSYN) IVPB 3.375 g        3.375 g 100 mL/hr over 30 Minutes Intravenous  Once 07/20/19 1342 07/20/19 1610   07/20/19 1315  vancomycin (VANCOCIN) injection 1 g  Status:  Discontinued        1 g Intravenous  Once 07/20/19 1303 07/20/19 1331   07/20/19 1315  piperacillin-tazobactam (ZOSYN) IVPB 4.5 g  Status:  Discontinued        4.5 g 200 mL/hr over 30 Minutes Intravenous  Once 07/20/19 1303 07/20/19 1342      Current Facility-Administered Medications  Medication Dose Route Frequency Provider Last Rate Last Admin  . 0.9 %  sodium chloride infusion   Intravenous PRN Ivor Costa, MD  Stopped at 07/25/19 1515  . 0.9 %  sodium chloride infusion   Intravenous Continuous [name], MD 125 mL/hr at 07/26/19 0509 Rate Verify at 07/26/19 0509  . acetaminophen (TYLENOL) tablet 650 mg  650 mg Oral Q6H PRN Lorretta Harp, MD   650 mg at 07/21/19 0129  . amLODipine (NORVASC) tablet 5 mg  5 mg Oral Daily Lolita Patella B, MD   5 mg at 07/24/19 0927  . aspirin EC tablet 81 mg  81 mg Oral Daily Lorretta Harp, MD   81 mg at 07/24/19 0737  . atorvastatin (LIPITOR) tablet 40 mg  40 mg Oral Daily Lorretta Harp, MD   40 mg at 07/24/19 0927  . Chlorhexidine Gluconate Cloth 2 % PADS 6 each  6 each Topical Daily Lorretta Harp, MD   6 each at 07/25/19 1150  . enoxaparin (LOVENOX) injection 40 mg  40 mg Subcutaneous Daily Ray Church III, MD   40 mg at 07/25/19 1024  . hydrALAZINE (APRESOLINE) injection 5 mg  5 mg Intravenous Q2H PRN Lorretta Harp, MD       . insulin aspart (novoLOG) injection 0-15 Units  0-15 Units Subcutaneous TID WC Lolita Patella B, MD   8 Units at 07/26/19 [phone]  . insulin aspart (novoLOG) injection 0-5 Units  0-5 Units Subcutaneous QHS Lolita Patella B, MD   2 Units at 07/25/19 2139  . insulin aspart (novoLOG) injection 10 Units  10 Units Subcutaneous TID WC Alford Highland, MD   10 Units at 07/26/19 6948  . insulin glargine (LANTUS) injection 23 Units  23 Units Subcutaneous QHS Lowella Bandy, RPH   23 Units at 07/25/19 2319  . linezolid (ZYVOX) IVPB 600 mg  600 mg Intravenous Q12H Tresa Moore, MD   Stopped at 07/25/19 2232  . morphine 2 MG/ML injection 2 mg  2 mg Intravenous Q4H PRN Lorretta Harp, MD   2 mg at 07/26/19 0832  . nicotine (NICODERM CQ - dosed in mg/24 hours) patch 21 mg  21 mg Transdermal Daily Lorretta Harp, MD      . nitroGLYCERIN (NITROSTAT) SL tablet 0.4 mg  0.4 mg Sublingual Q5 min PRN Lorretta Harp, MD      . ondansetron [employer]) injection 4 mg  4 mg Intravenous Q8H PRN Lorretta Harp, MD      . oxyCODONE-acetaminophen (PERCOCET/ROXICET) 5-325 MG per tablet 1 tablet  1 tablet Oral Q4H PRN Lorretta Harp, MD   1 tablet at 07/25/19 2134  . pantoprazole (PROTONIX) EC tablet 40 mg  40 mg Oral Daily Lorretta Harp, MD   40 mg at 07/24/19 0927  . piperacillin-tazobactam (ZOSYN) IVPB 3.375 g  3.375 g Intravenous Alean Rinne, MD 12.5 mL/hr at 07/26/19 0509 3.375 g at 07/26/19 0509  . sodium chloride 0.9 % bolus 2,000 mL  2,000 mL Intravenous Once Lorretta Harp, MD      . sodium chloride flush (NS) 0.9 % injection 10-40 mL  10-40 mL Intracatheter Q12H Georgeann Oppenheim, Sudheer B, MD   10 mL at 07/24/19 0928  . sodium chloride flush (NS) 0.9 % injection 10-40 mL  10-40 mL Intracatheter PRN Georgeann Oppenheim, Sudheer B, MD       Objective: Vital signs in last 24 hours: Temp:  [97 F (36.1 C)-98.1 F (36.7 C)] 97.9 F (36.6 C) (06/11 0419) Pulse Rate:  [61-86] 68 (06/11 0419) Resp:  [6-18] 18 (06/11 0419) BP: (131-160)/(76-122)  151/87 (06/11 0419) SpO2:  [85 %-99 %] 95 % (06/11 0419)  Intake/Output from  previous day: 06/10 0701 - 06/11 0700 In: 3378.8 [P.O.:260; I.V.:1966.9; IV Piggyback:1152] Out: 3105 [Urine:3100; Blood:5] Intake/Output this shift: No intake/output data recorded.  Physical Exam Vitals and nursing note reviewed.  HENT:     Head: Normocephalic and atraumatic.  Pulmonary:     Effort: Pulmonary effort is normal. No respiratory distress.  Genitourinary:    Comments: 4x4s and ABDs removed to reveal postop mesh from yesterday, no blood or purulence noted. Dressing changed performed; patient tolerated well. Skin:    General: Skin is warm and dry.  Neurological:     Mental Status: He is alert and oriented to person, place, and time.  Psychiatric:        Mood and Affect: Mood normal.        Behavior: Behavior normal.    Lab Results:  Recent Labs    07/24/19 1200  WBC 14.0*  HGB 11.0*  HCT 31.9*  PLT 244   BMET Recent Labs    07/24/19 1200 07/25/19 0612  NA 141 143  K 3.4* 4.1  CL 111 113*  CO2 25 21*  GLUCOSE 171* 243*  BUN 10 11  CREATININE 1.64* 1.64*  CALCIUM 7.6* 8.1*   Assessment & Plan: 39 year old male admitted with Fournier's gangrene now s/p primary debridement and subsequent biological mesh placement per plastics.  Patient recovering well today.  Dressing change completed this morning per Dr. Thomos Lemons recommendations, copied below:  "Remove the dressing once a day.  Gently clean around the wound with saline moistened gauze.  Apply K-Y jelly to the mesh surface and then reapply 4 x 4's, ABDs and mesh underwear.  This can be done more than once a day if needed.  Would avoid showering until his follow-up visit with me.  I would like to see him in around 1 week after discharge in clinic.  I recommend him limiting his activities much as possible to prevent shearing or tension on the wound."  Recommendations: -Okay to discharge today, no follow-up with urology  required -Patient will require close follow-up with Dr. [name] of plastic surgery in approximately 1 week -At least daily dressing changes per Dr. Thomos Lemons recommendations, copied above. -Counseled patient to avoid showers and physical activity pending plastic surgery follow-up  Carman Ching, PA-C 07/26/2019

## 2019-07-26 NOTE — Progress Notes (Signed)
Martin Mullen A and O x4. VSS. Pt tolerating diet well. No complaints of nausea or vomiting. IV removed intact, prescriptions given. Pt voices understanding of discharge instructions with no further questions. Patient discharged via wheelchair with SN  Allergies as of 07/26/2019   No Known Allergies     Medication List    STOP taking these medications   cyclobenzaprine 10 MG tablet Commonly known as: FLEXERIL   glipiZIDE 10 MG tablet Commonly known as: GLUCOTROL   oxyCODONE 5 MG immediate release tablet Commonly known as: Oxy IR/ROXICODONE   sulfamethoxazole-trimethoprim 800-160 MG tablet Commonly known as: BACTRIM DS     TAKE these medications   amoxicillin-clavulanate 875-125 MG tablet Commonly known as: Augmentin Take 1 tablet by mouth 2 (two) times daily for 7 days.   aspirin 81 MG EC tablet Take 1 tablet (81 mg total) by mouth daily.   Bayer Contour Next Monitor w/Device Kit 1 Device   Contour Next Test test strip Generic drug: glucose blood USE 1 STRIP TO CHECK GLUCOSE THREE TIMES DAILY AS NEEDED   Insulin Pen Needle 31G X 8 MM Misc 1 each by Does not apply route daily.   Lantus SoloStar 100 UNIT/ML Solostar Pen Generic drug: insulin glargine Inject 23 Units into the skin daily.   losartan 50 MG tablet Commonly known as: COZAAR Take 1 tablet (50 mg total) by mouth daily.   Lyumjev KwikPen 100 UNIT/ML Sopn Generic drug: Insulin Lispro-aabc (1 U Dial) Inject 6 Units into the skin 3 (three) times daily before meals.   metFORMIN 1000 MG tablet Commonly known as: GLUCOPHAGE TAKE 1 TABLET BY MOUTH TWICE DAILY WITH A MEAL What changed:   how much to take  how to take this  when to take this   nicotine 21 mg/24hr patch Commonly known as: NICODERM CQ - dosed in mg/24 hours One 21mg patch every day to chest wall (okay to substitute generic)   nitroGLYCERIN 0.4 MG SL tablet Commonly known as: NITROSTAT DISSOLVE ONE TABLET UNDER THE TONGUE EVERY 5 MINUTES  AS NEEDED FOR CHEST PAIN.  DO NOT EXCEED A TOTAL OF 3 DOSES IN 15 MINUTES   omeprazole 20 MG capsule Commonly known as: PRILOSEC Take 1 capsule (20 mg total) by mouth daily.   oxyCODONE-acetaminophen 5-325 MG tablet Commonly known as: PERCOCET/ROXICET Take 1 tablet by mouth every 6 (six) hours as needed for moderate pain or severe pain.   simvastatin 80 MG tablet Commonly known as: ZOCOR Take 1 tablet (80 mg total) by mouth daily.   sitaGLIPtin 100 MG tablet Commonly known as: Januvia Take 1 tablet (100 mg total) by mouth daily.       Vitals:   07/26/19 0419 07/26/19 0952  BP: (!) 151/87 (!) 166/95  Pulse: 68   Resp: 18   Temp: 97.9 F (36.6 C)   SpO2: 95%     Martin Mullen  

## 2019-07-26 NOTE — Telephone Encounter (Signed)
Pt as appt scheduled

## 2019-07-26 NOTE — Discharge Summary (Signed)
[facility] [place] [facility] [place] NAME: Martin Mullen    MR#:  [id number]  DATE OF BIRTH:  Mar 02, 1980  DATE OF ADMISSION:  07/20/2019 ADMITTING PHYSICIAN: Ivor Costa, MD  DATE OF DISCHARGE: 07/26/2019 12:00 PM  PRIMARY CARE PHYSICIAN: [name], NP    ADMISSION DIAGNOSIS:  Fournier gangrene [N49.3]  DISCHARGE DIAGNOSIS:  Principal Problem:   Fournier's gangrene Active Problems:   HTN (hypertension)   HLD (hyperlipidemia)   Type 2 diabetes mellitus with hyperlipidemia (HCC)   Coronary artery disease   Sepsis ([place])   Diabetes mellitus without complication ([name])   Tobacco abuse   Hyponatremia   GERD (gastroesophageal reflux disease)   SECONDARY DIAGNOSIS:   Past Medical History:  Diagnosis Date  . Acute transmural inferior wall MI ([name]) 08/28/2012   .5 x 12 mm Veri-flex stent non-DES.  Marland Kitchen Chicken pox   . Coronary artery disease    Inferior ST elevation myocardial infarction in July of 2014. Cardiac catheterization showed 95% distal RCA stenosis and 90% first diagonal stenosis with normal ejection fraction. He underwent PCI in bare-metal stent placement to the distal RCA.  . Diabetes mellitus without complication ([name])   . Hypercholesterolemia   . Tobacco use     HOSPITAL COURSE:   1.  Sepsis secondary to Fournier's gangrene.  This was present on admission.  Scrotal debridement and exploration done on 07/20/2019.  Patient had a closure procedure on 07/25/2019 by Dr. Claudia Desanctis.  Urology cleared to go home.  They were okay with taking out the Foley catheter today.  Follow-up with Dr. Claudia Desanctis as outpatient.  Urology went over instructions for wound for the patient.  Case discussed with infectious disease pharmacist and prescribed Augmentin 875 mg twice a day for another 7 days.  The patient did receive IV Zyvox and Zosyn while here in the hospital.  Only a small prescription for postoperative pain medication prescribed. 2.  Chronic kidney  disease stage IIIa with type 2 diabetes mellitus on glargine insulin.  Patient was given short acting insulin prior to meals.  Can go back on Glucophage and sitagliptin as outpatient but I will discontinue the glipizide. 3.  Essential hypertension can go back on the Cozaar at home. 4.  Hyperlipidemia unspecified on atorvastatin 5.  Chronic kidney disease stage IIIa. 6.  Tobacco abuse on nicotine patch 7.  GERD on PPI  DISCHARGE CONDITIONS:   Satisfactory  CONSULTS OBTAINED:  Treatment Team:  Lucas Mallow, MD  DRUG ALLERGIES:  No Known Allergies  DISCHARGE MEDICATIONS:   Allergies as of 07/26/2019   No Known Allergies     Medication List    STOP taking these medications   cyclobenzaprine 10 MG tablet Commonly known as: FLEXERIL   glipiZIDE 10 MG tablet Commonly known as: GLUCOTROL   oxyCODONE 5 MG immediate release tablet Commonly known as: Oxy IR/ROXICODONE   sulfamethoxazole-trimethoprim 800-160 MG tablet Commonly known as: BACTRIM DS     TAKE these medications   amoxicillin-clavulanate 875-125 MG tablet Commonly known as: Augmentin Take 1 tablet by mouth 2 (two) times daily for 7 days.   aspirin 81 MG EC tablet Take 1 tablet (81 mg total) by mouth daily.   Architect w/Device Kit 1 Device   Contour Next Test test strip Generic drug: glucose blood USE 1 STRIP TO CHECK GLUCOSE THREE TIMES DAILY AS NEEDED   Insulin Pen Needle 31G X 8 MM Misc 1 each by Does not apply  route daily.   Lantus SoloStar 100 UNIT/ML Solostar Pen Generic drug: insulin glargine Inject 23 Units into the skin daily.   losartan 50 MG tablet Commonly known as: COZAAR Take 1 tablet (50 mg total) by mouth daily.   Lyumjev KwikPen 100 UNIT/ML Sopn Generic drug: Insulin Lispro-aabc (1 U Dial) Inject 6 Units into the skin 3 (three) times daily before meals.   metFORMIN 1000 MG tablet Commonly known as: GLUCOPHAGE TAKE 1 TABLET BY MOUTH TWICE DAILY WITH A  MEAL What changed:  how much to take how to take this when to take this   nicotine 21 mg/24hr patch Commonly known as: NICODERM CQ - dosed in mg/24 hours One 103m patch every day to chest wall (okay to substitute generic)   nitroGLYCERIN 0.4 MG SL tablet Commonly known as: NITROSTAT DISSOLVE ONE TABLET UNDER THE TONGUE EVERY 5 MINUTES AS NEEDED FOR CHEST PAIN.  DO NOT EXCEED A TOTAL OF 3 DOSES IN 15 MINUTES   omeprazole 20 MG capsule Commonly known as: PRILOSEC Take 1 capsule (20 mg total) by mouth daily.   oxyCODONE-acetaminophen 5-325 MG tablet Commonly known as: PERCOCET/ROXICET Take 1 tablet by mouth every 6 (six) hours as needed for moderate pain or severe pain.   simvastatin 80 MG tablet Commonly known as: ZOCOR Take 1 tablet (80 mg total) by mouth daily.   sitaGLIPtin 100 MG tablet Commonly known as: Januvia Take 1 tablet (100 mg total) by mouth daily.        DISCHARGE INSTRUCTIONS:   Follow-up PMD 5 days Follow-up Dr. PClaudia Desanctis1 week  If you experience worsening of your admission symptoms, develop shortness of breath, life threatening emergency, suicidal or homicidal thoughts you must seek medical attention immediately by calling 911 or calling your MD immediately  if symptoms less severe.  You Must read complete instructions/literature along with all the possible adverse reactions/side effects for all the Medicines you take and that have been prescribed to you. Take any new Medicines after you have completely understood and accept all the possible adverse reactions/side effects.   Please note  You were cared for by a hospitalist during your hospital stay. If you have any questions about your discharge medications or the care you received while you were in the hospital after you are discharged, you can call the unit and asked to speak with the hospitalist on call if the hospitalist that took care of you is not available. Once you are discharged, your primary care  physician will handle any further medical issues. Please note that NO REFILLS for any discharge medications will be authorized once you are discharged, as it is imperative that you return to your primary care physician (or establish a relationship with a primary care physician if you do not have one) for your aftercare needs so that they can reassess your need for medications and monitor your lab values.    Today   CHIEF COMPLAINT:   Chief Complaint  Patient presents with  . Abscess    HISTORY OF PRESENT ILLNESS:  Martin Mullen is a 39y.o. male came in with a scrotal abscess   VITAL SIGNS:  Blood pressure (!) 166/95, pulse 68, temperature 97.9 F (36.6 C), temperature source Oral, resp. rate 18, height 5' 10"  (1.778 m), weight 113.4 kg, SpO2 95 %.  I/O:    Intake/Output Summary (Last 24 hours) at 07/26/2019 1605 Last data filed at 07/26/2019 1000 Gross per 24 hour  Intake 3128.83 ml  Output 3155 ml  Net -26.17 ml    PHYSICAL EXAMINATION:  GENERAL:  39 y.o.-year-old patient lying in the bed with no acute distress.  EYES: Pupils equal, round, reactive to light and accommodation. No scleral icterus. Extraocular muscles intact.  HEENT: Head atraumatic, normocephalic. Oropharynx and nasopharynx clear.  NECK:  Supple, no jugular venous distention. No thyroid enlargement, no tenderness.  LUNGS: Normal breath sounds bilaterally, no wheezing, rales,rhonchi or crepitation. No use of accessory muscles of respiration.  CARDIOVASCULAR: S1, S2 normal. No murmurs, rubs, or gallops.  ABDOMEN: Soft, non-tender, non-distended. Bowel sounds present. No organomegaly or mass.  EXTREMITIES: No pedal edema, cyanosis, or clubbing.  NEUROLOGIC: Cranial nerves II through XII are intact. Muscle strength 5/5 in all extremities. Sensation intact. Gait not checked.  PSYCHIATRIC: The patient is alert and oriented x 3.  SKIN: No obvious rash, lesion, or ulcer.   DATA REVIEW:   CBC Recent Labs  Lab  07/24/19 1200  WBC 14.0*  HGB 11.0*  HCT 31.9*  PLT 244    Chemistries  Recent Labs  Lab 07/20/19 1005 07/21/19 0641 07/25/19 0612  NA 127*   < > 143  K 3.8   < > 4.1  CL 94*   < > 113*  CO2 20*   < > 21*  GLUCOSE 307*   < > 243*  BUN 11   < > 11  CREATININE 1.10   < > 1.64*  CALCIUM 8.3*   < > 8.1*  AST 16  --   --   ALT 21  --   --   ALKPHOS 91  --   --   BILITOT 1.6*  --   --    < > = values in this interval not displayed.     Microbiology Results  Results for orders placed or performed during the hospital encounter of 07/20/19  SARS Coronavirus 2 by RT PCR (hospital order, performed in [facility] hospital lab) Nasopharyngeal Nasopharyngeal Swab     Status: None   Collection Time: 07/20/19  2:08 PM   Specimen: Nasopharyngeal Swab  Result Value Ref Range Status   SARS Coronavirus 2 NEGATIVE NEGATIVE Final    Comment: (NOTE) SARS-CoV-2 target nucleic acids are NOT DETECTED. The SARS-CoV-2 RNA is generally detectable in upper and lower respiratory specimens during the acute phase of infection. The lowest concentration of SARS-CoV-2 viral copies this assay can detect is 250 copies / mL. A negative result does not preclude SARS-CoV-2 infection and should not be used as the sole basis for treatment or other patient management decisions.  A negative result may occur with improper specimen collection / handling, submission of specimen other than nasopharyngeal swab, presence of viral mutation(s) within the areas targeted by this assay, and inadequate number of viral copies (<250 copies / mL). A negative result must be combined with clinical observations, patient history, and epidemiological information. Fact Sheet for Patients:   StrictlyIdeas.no Fact Sheet for Healthcare Providers: BankingDealers.co.za This test is not yet approved or cleared  by the [place] FDA and has been authorized for detection and/or  diagnosis of SARS-CoV-2 by FDA under an Emergency Use Authorization (EUA).  This EUA will remain in effect (meaning this test can be used) for the duration of the COVID-19 declaration under Section 564(b)(1) of the Act, 21 U.S.C. section 360bbb-3(b)(1), unless the authorization is terminated or revoked sooner. Performed at [facility], [address], [place], [place] [address]   Aerobic/Anaerobic Culture (surgical/deep wound)     Status: None  Collection Time: 07/20/19  4:13 PM   Specimen: PATH Other; Wound  Result Value Ref Range Status   Specimen Description   Final    WOUND Performed at [facility], [address] [place], [place] [address]    Special Requests   Final    NONE Performed at [facility], [address] [place], [place] [address]    Gram Stain   Final    FEW WBC PRESENT, PREDOMINANTLY PMN MODERATE GRAM POSITIVE COCCI MODERATE GRAM NEGATIVE RODS Performed at [facility], [place] [address] [place], [place] [address]    Culture   Final    MIXED ANAEROBIC FLORA PRESENT.  CALL LAB IF FURTHER IID REQUIRED.   Report Status 07/24/2019 FINAL  Final  CULTURE, BLOOD (ROUTINE X 2) w Reflex to ID Panel     Status: None   Collection Time: 07/20/19  6:51 PM   Specimen: BLOOD  Result Value Ref Range Status   Specimen Description BLOOD BLOOD RIGHT HAND  Final   Special Requests   Final    BOTTLES DRAWN AEROBIC AND ANAEROBIC Blood Culture adequate volume   Culture   Final    NO GROWTH 5 DAYS Performed at [facility], [address] [place], [place] [address]    Report Status 07/25/2019 FINAL  Final  CULTURE, BLOOD (ROUTINE X 2) w Reflex to ID Panel     Status: None   Collection Time: 07/20/19  7:52 PM   Specimen: BLOOD  Result Value Ref Range Status   Specimen Description BLOOD BLOOD RIGHT HAND  Final   Special Requests   Final    BOTTLES DRAWN AEROBIC ONLY Blood Culture adequate volume   Culture   Final    NO  GROWTH 5 DAYS Performed at [facility], [address] [place],  [address]    Report Status 07/25/2019 FINAL  Final     Management plans discussed with the patient, and he is in agreement.  CODE STATUS:     Code Status Orders  (From admission, onward)         Start     Ordered   07/20/19 1522  Full code  Continuous        07/20/19 1521        Code Status History    Date Active Date Inactive Code Status Order ID Comments User Context   03/18/2017 2306 03/21/2017 1709 Full Code [id number]  Amelia Jo, MD Inpatient   03/05/2015 1616 03/06/2015 1834 Full Code [id number]  Aldean Jewett, MD Inpatient   08/28/2012 1038 08/30/2012 1429 Full Code [id number]  Laverda Page, MD Inpatient   Advance Care Planning Activity      TOTAL TIME TAKING CARE OF THIS PATIENT: 35 minutes.    Loletha Grayer M.D on 07/26/2019 at 4:05 PM  Between 7am to 6pm - Pager - [phone]  After 6pm go to [email or web address] - password [email or web address]  [facility]  CC: Primary care physician; [name], NP

## 2019-07-26 NOTE — Discharge Instructions (Signed)
Dressing changes as per urologyy instructions

## 2019-07-26 NOTE — Anesthesia Postprocedure Evaluation (Signed)
Anesthesia Post Note  Patient: Martin Mullen  Procedure(s) Performed: DEBRIDEMENT AND CLOSURE WOUND (N/A )  Patient location during evaluation: PACU Anesthesia Type: General Level of consciousness: awake and alert Pain management: pain level controlled Vital Signs Assessment: post-procedure vital signs reviewed and stable Respiratory status: spontaneous breathing, nonlabored ventilation, respiratory function stable and patient connected to nasal cannula oxygen Cardiovascular status: blood pressure returned to baseline and stable Postop Assessment: no apparent nausea or vomiting Anesthetic complications: no   No complications documented.   Last Vitals:  Vitals:   07/25/19 2112 07/26/19 0419  BP: (!) 145/89 (!) 151/87  Pulse: 86 68  Resp: 18 18  Temp: 36.6 C 36.6 C  SpO2: 97% 95%    Last Pain:  Vitals:   07/26/19 0419  TempSrc: Oral  PainSc:                  [name]

## 2019-07-29 ENCOUNTER — Ambulatory Visit: Payer: BC Managed Care – PPO | Admitting: Internal Medicine

## 2019-07-29 ENCOUNTER — Encounter: Payer: Self-pay | Admitting: Internal Medicine

## 2019-07-29 ENCOUNTER — Other Ambulatory Visit: Payer: Self-pay

## 2019-07-29 VITALS — BP 148/94 | HR 79 | Temp 97.9°F | Wt 256.0 lb

## 2019-07-29 DIAGNOSIS — N493 Fournier gangrene: Secondary | ICD-10-CM

## 2019-07-29 NOTE — Progress Notes (Signed)
Subjective:    Patient ID: Martin Mullen, male    DOB: November 10, 1980, 39 y.o.   MRN: [id number]  HPI  Pt presents to the clinic today for hospital follow up. He went to the ER 6/3 with c/o perineal swelling and pain. Scrotal ultrasound c/w abscess. His WBC was 21. He was started on Bactrim and Keflex. He declined surgical intervention in the OR. He was discharged and advised to follow up with urology/PCP. He returned to the ER 6/5 with c/o increased swelling and pain. He was diagnosed with Fournier's Gangrene, underwent surgical debridement on 6/5. He subsequently underwent surgical closure on 6/10. He was discharged on Augmentin and advised to follow up with urology as an outpatient. Since discharge, he reports continued swelling, pain and drainage. The drainage is clear, yellow without odor. He is not having any issues voiding or passing stool. He reports fever up to 99, but denies chills or body aches. He continues to take his antibiotics. His girlfriend has been doing scrotal dressing changes daily.  He follows up with plastics on Thursday.  Review of Systems      Past Medical History:  Diagnosis Date  . Acute transmural inferior wall MI ([place]) 08/28/2012   .5 x 12 mm Veri-flex stent non-DES.  Marland Kitchen Chicken pox   . Coronary artery disease    Inferior ST elevation myocardial infarction in July of 2014. Cardiac catheterization showed 95% distal RCA stenosis and 90% first diagonal stenosis with normal ejection fraction. He underwent PCI in bare-metal stent placement to the distal RCA.  . Diabetes mellitus without complication ([place])   . Hypercholesterolemia   . Tobacco use     Current Outpatient Medications  Medication Sig Dispense Refill  . amoxicillin-clavulanate (AUGMENTIN) 875-125 MG tablet Take 1 tablet by mouth 2 (two) times daily for 7 days. 14 tablet 0  . aspirin 81 MG EC tablet Take 1 tablet (81 mg total) by mouth daily. 30 tablet 4  . Blood Glucose Monitoring Suppl (BAYER CONTOUR  NEXT MONITOR) w/Device KIT 1 Device 1 kit 0  . CONTOUR NEXT TEST test strip USE 1 STRIP TO CHECK GLUCOSE THREE TIMES DAILY AS NEEDED 100 each 0  . insulin glargine (LANTUS SOLOSTAR) 100 UNIT/ML Solostar Pen Inject 23 Units into the skin daily. 15 pen 1  . Insulin Lispro-aabc, 1 U Dial, (LYUMJEV KWIKPEN) 100 UNIT/ML SOPN Inject 6 Units into the skin 3 (three) times daily before meals. 1 pen 0  . Insulin Pen Needle 31G X 8 MM MISC 1 each by Does not apply route daily. 90 each 1  . losartan (COZAAR) 50 MG tablet Take 1 tablet (50 mg total) by mouth daily. 30 tablet 0  . metFORMIN (GLUCOPHAGE) 1000 MG tablet TAKE 1 TABLET BY MOUTH TWICE DAILY WITH A MEAL (Patient taking differently: Take 1,000 mg by mouth 2 (two) times daily with a meal. TAKE 1 TABLET BY MOUTH TWICE DAILY WITH A MEAL) 180 tablet 0  . nicotine (NICODERM CQ - DOSED IN MG/24 HOURS) 21 mg/24hr patch One 38m patch every day to chest wall (okay to substitute generic) 28 patch 0  . nitroGLYCERIN (NITROSTAT) 0.4 MG SL tablet DISSOLVE ONE TABLET UNDER THE TONGUE EVERY 5 MINUTES AS NEEDED FOR CHEST PAIN.  DO NOT EXCEED A TOTAL OF 3 DOSES IN 15 MINUTES 25 tablet 12  . omeprazole (PRILOSEC) 20 MG capsule Take 1 capsule (20 mg total) by mouth daily. 30 capsule 3  . oxyCODONE-acetaminophen (PERCOCET/ROXICET) 5-325 MG tablet Take 1  tablet by mouth every 6 (six) hours as needed for moderate pain or severe pain. 16 tablet 0  . simvastatin (ZOCOR) 80 MG tablet Take 1 tablet (80 mg total) by mouth daily. 90 tablet 3  . sitaGLIPtin (JANUVIA) 100 MG tablet Take 1 tablet (100 mg total) by mouth daily. 30 tablet 0   No current facility-administered medications for this visit.    No Known Allergies  Family History  Problem Relation Age of Onset  . Arthritis Mother        Rheumatoid  . Diabetes Mother   . Hyperlipidemia Mother   . Diabetes Father   . Cancer Maternal Grandfather        Prostate  . Parkinson's disease Maternal Grandfather      Social History   Socioeconomic History  . Marital status: Married    Spouse name: Not on file  . Number of children: Not on file  . Years of education: Not on file  . Highest education level: Not on file  Occupational History  . Not on file  Tobacco Use  . Smoking status: Current Every Day Smoker    Packs/day: 1.50    Years: 20.00    Pack years: 30.00    Types: Cigarettes  . Smokeless tobacco: Never Used  Substance and Sexual Activity  . Alcohol use: Yes    Alcohol/week: 0.0 standard drinks    Comment: occasional  . Drug use: Yes    Types: Marijuana  . Sexual activity: Yes    Birth control/protection: None  Other Topics Concern  . Not on file  Social History Narrative  . Not on file   Social Determinants of Health   Financial Resource Strain:   . Difficulty of Paying Living Expenses:   Food Insecurity:   . Worried About Running Out of Food in the Last Year:   . Ran Out of Food in the Last Year:   Transportation Needs:   . Lack of Transportation (Medical):   . Lack of Transportation (Non-Medical):   Physical Activity:   . Days of Exercise per Week:   . Minutes of Exercise per Session:   Stress:   . Feeling of Stress :   Social Connections:   . Frequency of Communication with Friends and Family:   . Frequency of Social Gatherings with Friends and Family:   . Attends Religious Services:   . Active Member of Clubs or Organizations:   . Attends Club or Organization Meetings:   . Marital Status:   Intimate Partner Violence:   . Fear of Current or Ex-Partner:   . Emotionally Abused:   . Physically Abused:   . Sexually Abused:      Constitutional: Pt reports fevers. Denies malaise, fatigue, headache or abrupt weight changes.  Respiratory: Denies difficulty breathing, shortness of breath, cough or sputum production.   Cardiovascular: Denies chest pain, chest tightness, palpitations or swelling in the hands or feet.  Gastrointestinal: Denies abdominal pain,  bloating, constipation, diarrhea or blood in the stool.  GU: Pt reports scrotal swelling, pain and discharge. Denies urgency, frequency, pain with urination, burning sensation, blood in urine, odor or discharge.  No other specific complaints in a complete review of systems (except as listed in HPI above).  Objective:   Physical Exam  BP (!) 148/94   Pulse 79   Temp 97.9 F (36.6 C) (Temporal)   Wt 256 lb (116.1 kg)   SpO2 97%   BMI 36.73 kg/m   Wt Readings from Last   3 Encounters:  07/20/19 250 lb (113.4 kg)  06/11/19 245 lb (111.1 kg)  05/09/19 248 lb (112.5 kg)    General: Appears her stated age, obese, in NAD. Cardiovascular: Normal rate.  Pulmonary/Chest: Normal effort.  Abdomen: Soft and nontender. Normal bowel sounds. No distention or masses noted.  GU: Scrotum open with mesh intact. Serous drainage noted, no odor. No redness. Neurological: Alert and oriented.   BMET    Component Value Date/Time   NA 143 07/25/2019 0612   NA 141 09/26/2018 1547   NA 139 01/29/2013 0040   K 4.1 07/25/2019 0612   K 4.0 01/29/2013 0040   CL 113 (H) 07/25/2019 0612   CL 107 01/29/2013 0040   CO2 21 (L) 07/25/2019 0612   CO2 28 01/29/2013 0040   GLUCOSE 243 (H) 07/25/2019 0612   GLUCOSE 228 (H) 01/29/2013 0040   BUN 11 07/25/2019 0612   BUN 11 09/26/2018 1547   BUN 15 01/29/2013 0040   CREATININE 1.64 (H) 07/25/2019 0612   CREATININE 0.88 01/29/2013 0040   CALCIUM 8.1 (L) 07/25/2019 0612   CALCIUM 9.2 01/29/2013 0040   GFRNONAA 52 (L) 07/25/2019 0612   GFRNONAA >60 01/29/2013 0040   GFRAA >60 07/25/2019 0612   GFRAA >60 01/29/2013 0040    Lipid Panel     Component Value Date/Time   CHOL 163 09/26/2018 1547   TRIG 176 (H) 09/26/2018 1547   HDL 39 (L) 09/26/2018 1547   CHOLHDL 4.2 09/26/2018 1547   CHOLHDL 6 06/20/2017 1510   VLDL 50.2 (H) 06/20/2017 1510   LDLCALC 89 09/26/2018 1547    CBC    Component Value Date/Time   WBC 14.0 (H) 07/24/2019 1200   RBC 3.36  (L) 07/24/2019 1200   HGB 11.0 (L) 07/24/2019 1200   HGB 16.8 09/26/2018 1547   HCT 31.9 (L) 07/24/2019 1200   HCT 49.4 09/26/2018 1547   PLT 244 07/24/2019 1200   PLT 220 09/26/2018 1547   MCV 94.9 07/24/2019 1200   MCV 95 09/26/2018 1547   MCV 93 01/29/2013 0040   MCH 32.7 07/24/2019 1200   MCHC 34.5 07/24/2019 1200   RDW 13.2 07/24/2019 1200   RDW 12.2 09/26/2018 1547   RDW 13.8 01/29/2013 0040   LYMPHSABS 2.8 07/24/2019 1200   MONOABS 1.3 (H) 07/24/2019 1200   EOSABS 0.4 07/24/2019 1200   BASOSABS 0.1 07/24/2019 1200    Hgb A1C Lab Results  Component Value Date   HGBA1C 7.5 (A) 03/05/2019           Assessment & Plan:   Hospital Follow Up of Fournier's Gangrene of the Scrotum:  Hospital notes, labs and imaging reviewed Continue Augmentin CBC and CMET today Follow up with plastics as an outpatient STD will be filled out pending plastics follow up  Return precautions discussed  Regina [name], NP This visit occurred during the SARS-CoV-2 public health emergency.  Safety protocols were in place, including screening questions prior to the visit, additional usage of staff PPE, and extensive cleaning of exam room while observing appropriate contact time as indicated for disinfecting solutions.    

## 2019-07-29 NOTE — Patient Instructions (Signed)
Gangrene Gangrene is a medical condition that is caused by a lack of blood supply to an area of your body. Oxygen travels through blood, so less blood means less oxygen. Without oxygen, body tissue will start to die. Gangrene can affect many parts of the body. It is most common on the skin (external gangrene) and on your arms and legs, but it can also affect internal body parts (internal gangrene). Gangrene requires emergency medical treatment. What are the causes? Any condition that cuts off blood supply can cause gangrene. Common causes include:  Injuries.  Blood vessel diseases.  Diabetes.  Infections. What increases the risk? You may be at increased risk for gangrene if you have recently had surgery, or if you have:  A serious injury.  Diabetes.  A weak disease-fighting system (immune system).  Narrowing of your arteries due to plaque buildup (arteriosclerosis).  An immune system disease that causes your arteries to tighten (Raynaud's disease).  A history of IV drug use. What are the signs or symptoms? Symptoms depend on which part of your body is affected. If you have external gangrene, you may have:  Severe pain, followed by loss of feeling.  Redness and swelling.  Crackling sounds when you press on a swollen area of skin.  A wound with bad-smelling drainage.  Changes in the color of your skin. It may turn red, blue, black, or white.  Fever and chills. If you have internal gangrene, you may have:  Fever and chills.  Confusion.  Dizziness.  Severe pain.  Rapid heartbeat and breathing.  Loss of appetite.  Nausea or vomiting. How is this diagnosed? This condition may be diagnosed based on:  Your symptoms and medical history.  A physical exam.  X-rays of your blood vessels after injecting dye into your blood vessels (arteriogram).  Blood tests to check for infection.  Imaging tests such as a CT scan or MRI.  Testing a sample of wound drainage for  bacteria (culture).  Testing a tissue sample for cell death (biopsy).  Surgery to look for gangrene inside your body. How is this treated? Treatment for gangrene depends on the cause and the area of your body that is affected. Gangrene is usually treated in the hospital. It is very important to start treatment early, before gangrene spreads. Treatment may include:  High doses of IV antibiotic medicine, for gangrene caused by infection.  Surgery. Surgical options may include: ? Debridement. This is surgery to remove dead tissue. You may need to have debridement several times. ? Bypass or angioplasty. These surgeries improve blood flow to an affected area. ? Amputation. This is surgery to remove a body part. This may be necessary in very severe cases.  Oxygen therapy. This involves treatment in a chamber designed to provide high levels of oxygen (hyperbaric oxygen therapy). It can improve the oxygen supply to an affected area.  Supportive care to keep the rest of your body healthy. This may include: ? IV fluids and nutrients. ? Pain medicines. ? Blood thinners to prevent blood clots. Follow these instructions at home: Skin and wound care  Clean any cuts or scratches with a germ-killing (antiseptic) solution. Your health care provider may recommend certain over-the-counter antiseptics.  Cover any open wounds with a clean bandage. Change the bandage as often as needed to keep the wound clean. Make sure to wash your hands before touching your bandage.  Check wounds every day for signs of infection, such as: ? Redness, swelling, or pain. ? Fluid or   blood. ? Warmth. ? Pus or a bad smell.  If you have diabetes or another blood vessel disease, check your feet every day for signs of injury or infection. You may be at higher risk for gangrene. Lifestyle  Do not use any products that contain nicotine or tobacco, such as cigarettes and e-cigarettes. These can delay wound healing. If you need  help quitting, ask your health care provider.  Limit alcohol intake to no more than 1 drink a day for women (no drinks if you are pregnant) and 2 drinks a day for men. One drink equals 12 oz of beer, 5 oz of wine, or 1 oz of hard liquor.  Eat a healthy diet and maintain a healthy weight.  Get some exercise on most days of the week. Ask your health care provider what forms of exercise may be best for you. General instructions  Keep all follow-up visits as told by your health care provider. This is important. Medicines  Take over-the-counter and prescription medicines only as told by your health care provider.  If you were prescribed an antibiotic medicine, take it as told by your health care provider. Do not stop taking the antibiotic even if you start to feel better.  If you are taking blood thinners: ? Talk with your health care provider before you take any medicines that contain aspirin or NSAIDs. These medicines increase your risk for dangerous bleeding. ? Take your medicine exactly as told, at the same time every day. ? Avoid activities that could cause injury or bruising, and follow instructions about how to prevent falls. ? Wear a medical alert bracelet or carry a card that lists what medicines you take. Contact a health care provider if:  You have a wound or sore that is not healing. Get help right away if:  You have a wound or sore that shows signs of infection.  An area of your skin turns white, red, blue, or black.  You have rapidly worsening pain at the site of a skin infection or wound.  You experience numbness at the site of a skin infection or wound.  You have unexplained: ? Fever. ? Chills. ? Confusion. ? Fainting. Summary  Gangrene is a medical condition that is caused by a lack of blood supply to an area of your body.  It is most common on the skin (external gangrene), but it can also affect internal body parts (internal gangrene).  Injuries and blood  vessel diseases are common causes of gangrene.  Gangrene requires emergency medical treatment. Treatment may include hospitalization, antibiotics, or surgery. This information is not intended to replace advice given to you by your health care provider. Make sure you discuss any questions you have with your health care provider. Document Revised: 01/13/2017 Document Reviewed: 11/08/2016 Elsevier Patient Education  2020 [employer]  

## 2019-07-30 LAB — CBC
Hematocrit: 37.2 % — ABNORMAL LOW (ref 37.5–51.0)
Hemoglobin: 12.7 g/dL — ABNORMAL LOW (ref 13.0–17.7)
MCH: 32.8 pg (ref 26.6–33.0)
MCHC: 34.1 g/dL (ref 31.5–35.7)
MCV: 96 fL (ref 79–97)
Platelets: 432 10*3/uL (ref 150–450)
RBC: 3.87 x10E6/uL — ABNORMAL LOW (ref 4.14–5.80)
RDW: 13.1 % (ref 11.6–15.4)
WBC: 17.7 10*3/uL — ABNORMAL HIGH (ref 3.4–10.8)

## 2019-07-30 LAB — BASIC METABOLIC PANEL
BUN/Creatinine Ratio: 6 — ABNORMAL LOW (ref 9–20)
BUN: 8 mg/dL (ref 6–20)
CO2: 28 mmol/L (ref 20–29)
Calcium: 9.7 mg/dL (ref 8.7–10.2)
Chloride: 99 mmol/L (ref 96–106)
Creatinine, Ser: 1.31 mg/dL — ABNORMAL HIGH (ref 0.76–1.27)
GFR calc Af Amer: 79 mL/min/{1.73_m2} (ref >59)
GFR calc non Af Amer: 68 mL/min/{1.73_m2} (ref >59)
Glucose: 162 mg/dL — ABNORMAL HIGH (ref 65–99)
Potassium: 4.1 mmol/L (ref 3.5–5.2)
Sodium: 140 mmol/L (ref 134–144)

## 2019-07-31 NOTE — Progress Notes (Signed)
Patient is a 39 year old male here for follow-up after undergoing surgical preparation and application of fenestrated prime matrix AG mesh to the right scrotal wound on 07/25/2019 with Dr. [name].  Patient reports he is doing better since surgery.  Prime matrix AG mesh and protective Mepitel mesh are in place.  No signs of infection.  Moderate drainage on dressing. Pain is well controlled as long as no pressure is applied to scrotal area. Patient unable tolerate sitting in normal fashion at this time.   Patient reports he had occasional fevers that were controlled with Tylenol. Denies N/V.  Continue daily wound care. Gently clean around the wound with saline moistened gauze.  Apply K-Y jelly to the mesh surface, cover with 4 x 4 gauze, ABDs, and mesh underwear.  Follow up in 2 weeks. Tentative plan is for surgery in ~ 3-4 weeks to close or skin graft the wound. Recovery estimated for ~4-6 weeks after that surgery.   The 21st Century Cures Act was signed into law in 2016 which includes the topic of electronic health records.  This provides immediate access to information in MyChart.  This includes consultation notes, operative notes, office notes, lab results and pathology reports.  If you have any questions about what you read please let us know at your next visit or call us at the office.  We are right here with you.

## 2019-08-01 ENCOUNTER — Other Ambulatory Visit: Payer: Self-pay

## 2019-08-01 ENCOUNTER — Encounter: Payer: Self-pay | Admitting: Plastic Surgery

## 2019-08-01 ENCOUNTER — Ambulatory Visit (INDEPENDENT_AMBULATORY_CARE_PROVIDER_SITE_OTHER): Payer: BC Managed Care – PPO | Admitting: Plastic Surgery

## 2019-08-01 VITALS — BP 132/89 | HR 89 | Temp 97.8°F | Ht 70.0 in | Wt 256.0 lb

## 2019-08-01 DIAGNOSIS — N493 Fournier gangrene: Secondary | ICD-10-CM

## 2019-08-02 NOTE — Telephone Encounter (Signed)
Patient is requesting a call back  He stated he had an appointment with his Physical Therapist and they are going to take care of his short term disability but patient asked for a call back for he could further discuss

## 2019-08-06 NOTE — Telephone Encounter (Signed)
I spoke to pt and he stated that the plastic surgeon would like PCP to fill out PPW and recommends 10-12 weeks to be out of work... Pt returns in 2 weeks to hopefully close wound and states it can take minimum of 4 weeks for recovery.... Pt wants to put for 12 weeks from 08/01/2019 to return to work... pt is aware PPW will be filled out and faxed by 08/07/2019

## 2019-08-07 ENCOUNTER — Telehealth: Payer: Self-pay

## 2019-08-07 NOTE — Telephone Encounter (Signed)
Done, placed in MYD box 

## 2019-08-07 NOTE — Telephone Encounter (Signed)
PPW filled out and placed in your box for addit info and signature

## 2019-08-08 NOTE — Telephone Encounter (Signed)
FMLA ppw work faxed, copy sent to scan and 1 for pt 

## 2019-08-15 ENCOUNTER — Encounter: Payer: Self-pay | Admitting: Plastic Surgery

## 2019-08-15 ENCOUNTER — Other Ambulatory Visit: Payer: Self-pay

## 2019-08-15 ENCOUNTER — Ambulatory Visit (INDEPENDENT_AMBULATORY_CARE_PROVIDER_SITE_OTHER): Payer: BC Managed Care – PPO | Admitting: Plastic Surgery

## 2019-08-15 VITALS — BP 160/102 | HR 100 | Temp 98.0°F

## 2019-08-15 DIAGNOSIS — N493 Fournier gangrene: Secondary | ICD-10-CM

## 2019-08-15 NOTE — Progress Notes (Signed)
   Referring Provider [name], NP [address] [place],  [place] [address]   CC:  Chief Complaint  Patient presents with  . Follow-up      Martin Mullen is an 39 y.o. male.  HPI: Patient presents now 3 weeks out from what was effectively a temporary closure of a scrotal wound.  This was from an infection debridement.  He feels like things are going reasonably well but is still quite tender in that area.  He feels like the mesh is separating and feels like his testicle is descending a bit.  Review of Systems General: Denies fevers or chills  Physical Exam Vitals with BMI 08/15/2019 08/01/2019 07/29/2019  Height - 5\' 10"  -  Weight - 256 lbs 256 lbs  BMI - 36.73 36.73  Systolic 160 132  Diastolic 102 89 94  Pulse 100 89 79    General:  No acute distress,  Alert and oriented, Non-Toxic, Normal speech and affect On exam all the surrounding tissue looks healthy.  I do not detect any purulence or signs of persistent infection.  The biologic mesh has mostly disintegrated at this point.  There is still Mepitel in place over his exposed testicle which does appear to be distending slightly.  All the tissue at the base of the wound looks to be granulating fine.  Assessment/Plan Patient presents with a complex scrotal wound after Fournier's gangrene.  He is now about 4 weeks out from his initial debridement.  I believe he should have enough scrotal skin so that I can get this closed primarily with adjacent tissue transfer.  In the event that I cannot do that I will likely cover the remaining portion with a skin graft.  We discussed the risks that include bleeding, infection, damage to surrounding structures, need for additional procedures.  We discussed the potential for persistent wounds after the fact potentially needing additional surgeries.  We discussed failure of graft take if the graft is required.  I discussed potential donor sites for the graft would be the abdomen of the  thigh.  I explained I would leave the Penrose drain in to allow fluid to drain if the total closure is performed.  He is fully understanding and will try to get this scheduled in the next 2 weeks or so.  818 08/15/2019, 3:19 PM

## 2019-08-15 NOTE — H&P (View-Only) (Signed)
   Referring Provider [name], Regina W, NP [address] [place],  [place] [address]   CC:  Chief Complaint  Patient presents with  . Follow-up      Martin Mullen is an 39 y.o. male.  HPI: Patient presents now 3 weeks out from what was effectively a temporary closure of a scrotal wound.  This was from an infection debridement.  He feels like things are going reasonably well but is still quite tender in that area.  He feels like the mesh is separating and feels like his testicle is descending a bit.  Review of Systems General: Denies fevers or chills  Physical Exam Vitals with BMI 08/15/2019 08/01/2019 07/29/2019  Height - 5' 10" -  Weight - 256 lbs 256 lbs  BMI - 36.73 36.73  Systolic 160 132 148  Diastolic 102 89 94  Pulse 100 89 79    General:  No acute distress,  Alert and oriented, Non-Toxic, Normal speech and affect On exam all the surrounding tissue looks healthy.  I do not detect any purulence or signs of persistent infection.  The biologic mesh has mostly disintegrated at this point.  There is still Mepitel in place over his exposed testicle which does appear to be distending slightly.  All the tissue at the base of the wound looks to be granulating fine.  Assessment/Plan Patient presents with a complex scrotal wound after Fournier's gangrene.  He is now about 4 weeks out from his initial debridement.  I believe he should have enough scrotal skin so that I can get this closed primarily with adjacent tissue transfer.  In the event that I cannot do that I will likely cover the remaining portion with a skin graft.  We discussed the risks that include bleeding, infection, damage to surrounding structures, need for additional procedures.  We discussed the potential for persistent wounds after the fact potentially needing additional surgeries.  We discussed failure of graft take if the graft is required.  I discussed potential donor sites for the graft would be the abdomen of the  thigh.  I explained I would leave the Penrose drain in to allow fluid to drain if the total closure is performed.  He is fully understanding and will try to get this scheduled in the next 2 weeks or so.  [name] 08/15/2019, 3:19 PM        

## 2019-08-28 ENCOUNTER — Other Ambulatory Visit: Payer: Self-pay

## 2019-08-28 ENCOUNTER — Encounter (HOSPITAL_BASED_OUTPATIENT_CLINIC_OR_DEPARTMENT_OTHER): Payer: Self-pay | Admitting: Plastic Surgery

## 2019-08-28 NOTE — Progress Notes (Signed)
Reviewed pmh and recent hospitalization with Dr Malen Gauze. Ok for surgery with Dr [name] at [facility] 09/03/19, no clearance needed. Will get BMET at PAT appt   During pre op phone call pt denies any fever since d/c from the hospital. BS has been running wnl, not higher than 180 when it is elevated. Taking meds as prescribed.

## 2019-08-30 ENCOUNTER — Encounter (HOSPITAL_BASED_OUTPATIENT_CLINIC_OR_DEPARTMENT_OTHER)
Admission: RE | Admit: 2019-08-30 | Discharge: 2019-08-30 | Disposition: A | Discharge status: Home / Self Care | Payer: BC Managed Care – PPO | Source: Ambulatory Visit | Attending: Plastic Surgery | Admitting: Plastic Surgery

## 2019-08-30 ENCOUNTER — Other Ambulatory Visit (HOSPITAL_COMMUNITY)
Admission: RE | Admit: 2019-08-30 | Discharge: 2019-08-30 | Disposition: A | Discharge status: Home / Self Care | Payer: BC Managed Care – PPO | Source: Ambulatory Visit | Attending: Plastic Surgery | Admitting: Plastic Surgery

## 2019-08-30 ENCOUNTER — Other Ambulatory Visit: Payer: BC Managed Care – PPO

## 2019-08-30 DIAGNOSIS — Z01812 Encounter for preprocedural laboratory examination: Secondary | ICD-10-CM | POA: Diagnosis not present

## 2019-08-30 DIAGNOSIS — Z20822 Contact with and (suspected) exposure to covid-19: Secondary | ICD-10-CM | POA: Diagnosis not present

## 2019-08-30 LAB — BASIC METABOLIC PANEL
Anion gap: 9 (ref 5–15)
BUN: 9 mg/dL (ref 6–20)
CO2: 25 mmol/L (ref 22–32)
Calcium: 8.8 mg/dL — ABNORMAL LOW (ref 8.9–10.3)
Chloride: 102 mmol/L (ref 98–111)
Creatinine, Ser: 0.99 mg/dL (ref 0.61–1.24)
GFR calc Af Amer: >60 mL/min (ref >60)
GFR calc non Af Amer: >60 mL/min (ref >60)
Glucose, Bld: 320 mg/dL — ABNORMAL HIGH (ref 70–99)
Potassium: 4.9 mmol/L (ref 3.5–5.1)
Sodium: 136 mmol/L (ref 135–145)

## 2019-08-31 LAB — SARS CORONAVIRUS 2 (TAT 6-24 HRS): SARS Coronavirus 2: NEGATIVE

## 2019-09-02 NOTE — Progress Notes (Signed)
Labs reviewed with Dr. [name]. Continue with surgery as scheduled, recheck blood sugar DOS.

## 2019-09-03 ENCOUNTER — Encounter (HOSPITAL_BASED_OUTPATIENT_CLINIC_OR_DEPARTMENT_OTHER): Payer: Self-pay | Admitting: Plastic Surgery

## 2019-09-03 ENCOUNTER — Encounter (HOSPITAL_BASED_OUTPATIENT_CLINIC_OR_DEPARTMENT_OTHER)
Admission: RE | Disposition: A | Discharge status: Home / Self Care | Payer: Self-pay | Source: Home / Self Care | Attending: Plastic Surgery

## 2019-09-03 ENCOUNTER — Ambulatory Visit (HOSPITAL_BASED_OUTPATIENT_CLINIC_OR_DEPARTMENT_OTHER): Payer: BC Managed Care – PPO | Admitting: Certified Registered"

## 2019-09-03 ENCOUNTER — Other Ambulatory Visit: Payer: Self-pay

## 2019-09-03 ENCOUNTER — Ambulatory Visit (HOSPITAL_BASED_OUTPATIENT_CLINIC_OR_DEPARTMENT_OTHER)
Admission: RE | Admit: 2019-09-03 | Discharge: 2019-09-03 | Disposition: A | Discharge status: Home / Self Care | Payer: BC Managed Care – PPO | Attending: Plastic Surgery | Admitting: Plastic Surgery

## 2019-09-03 DIAGNOSIS — N493 Fournier gangrene: Secondary | ICD-10-CM | POA: Diagnosis not present

## 2019-09-03 DIAGNOSIS — E119 Type 2 diabetes mellitus without complications: Secondary | ICD-10-CM | POA: Insufficient documentation

## 2019-09-03 DIAGNOSIS — I252 Old myocardial infarction: Secondary | ICD-10-CM | POA: Diagnosis not present

## 2019-09-03 DIAGNOSIS — I2510 Atherosclerotic heart disease of native coronary artery without angina pectoris: Secondary | ICD-10-CM | POA: Insufficient documentation

## 2019-09-03 DIAGNOSIS — F17200 Nicotine dependence, unspecified, uncomplicated: Secondary | ICD-10-CM | POA: Insufficient documentation

## 2019-09-03 DIAGNOSIS — I129 Hypertensive chronic kidney disease with stage 1 through stage 4 chronic kidney disease, or unspecified chronic kidney disease: Secondary | ICD-10-CM | POA: Diagnosis not present

## 2019-09-03 DIAGNOSIS — E1122 Type 2 diabetes mellitus with diabetic chronic kidney disease: Secondary | ICD-10-CM | POA: Diagnosis not present

## 2019-09-03 DIAGNOSIS — I10 Essential (primary) hypertension: Secondary | ICD-10-CM | POA: Diagnosis not present

## 2019-09-03 DIAGNOSIS — S3130XA Unspecified open wound of scrotum and testes, initial encounter: Secondary | ICD-10-CM | POA: Diagnosis not present

## 2019-09-03 DIAGNOSIS — Z955 Presence of coronary angioplasty implant and graft: Secondary | ICD-10-CM | POA: Diagnosis not present

## 2019-09-03 HISTORY — PX: DEBRIDEMENT AND CLOSURE WOUND: SHX5614

## 2019-09-03 LAB — GLUCOSE, CAPILLARY
Glucose-Capillary: 304 mg/dL — ABNORMAL HIGH (ref 70–99)
Glucose-Capillary: 273 mg/dL — ABNORMAL HIGH (ref 70–99)

## 2019-09-03 SURGERY — DEBRIDEMENT, WOUND, WITH CLOSURE
Anesthesia: General | Site: Groin

## 2019-09-03 MED ORDER — MIDAZOLAM HCL 2 MG/2ML IJ SOLN
INTRAMUSCULAR | Status: AC
  Filled 2019-09-03: qty 2

## 2019-09-03 MED ORDER — LACTATED RINGERS IV SOLN: INTRAVENOUS | Status: DC

## 2019-09-03 MED ORDER — PROPOFOL 10 MG/ML IV BOLUS
INTRAVENOUS | Status: DC | PRN
  Administered 2019-09-03: 200 mg via INTRAVENOUS

## 2019-09-03 MED ORDER — CEFAZOLIN SODIUM-DEXTROSE 2-4 GM/100ML-% IV SOLN
2.0000 g | INTRAVENOUS | Status: AC
  Administered 2019-09-03: 2 g via INTRAVENOUS

## 2019-09-03 MED ORDER — FENTANYL CITRATE (PF) 100 MCG/2ML IJ SOLN
INTRAMUSCULAR | Status: DC | PRN
  Administered 2019-09-03 (×2): 25 ug via INTRAVENOUS
  Administered 2019-09-03: 50 ug via INTRAVENOUS

## 2019-09-03 MED ORDER — CELECOXIB 200 MG PO CAPS
200.0000 mg | ORAL_CAPSULE | Freq: Once | ORAL | Status: AC
  Administered 2019-09-03: 200 mg via ORAL

## 2019-09-03 MED ORDER — MIDAZOLAM HCL 5 MG/5ML IJ SOLN
INTRAMUSCULAR | Status: DC | PRN
  Administered 2019-09-03: 2 mg via INTRAVENOUS

## 2019-09-03 MED ORDER — SILVER NITRATE-POT NITRATE 75-25 % EX MISC
CUTANEOUS | Status: DC | PRN
Start: 2019-09-03 — End: 2019-09-03
  Administered 2019-09-03: 1 via TOPICAL

## 2019-09-03 MED ORDER — FENTANYL CITRATE (PF) 100 MCG/2ML IJ SOLN: 25.0000 ug | INTRAMUSCULAR | Status: DC | PRN

## 2019-09-03 MED ORDER — CEFAZOLIN SODIUM-DEXTROSE 2-4 GM/100ML-% IV SOLN
INTRAVENOUS | Status: AC
  Filled 2019-09-03: qty 100

## 2019-09-03 MED ORDER — LIDOCAINE HCL (CARDIAC) PF 100 MG/5ML IV SOSY
PREFILLED_SYRINGE | INTRAVENOUS | Status: DC | PRN
  Administered 2019-09-03: 60 mg via INTRAVENOUS

## 2019-09-03 MED ORDER — ACETAMINOPHEN 500 MG PO TABS
ORAL_TABLET | ORAL | Status: AC
  Filled 2019-09-03: qty 2

## 2019-09-03 MED ORDER — PROMETHAZINE HCL 25 MG/ML IJ SOLN: 6.2500 mg | INTRAMUSCULAR | Status: DC | PRN

## 2019-09-03 MED ORDER — ONDANSETRON HCL 4 MG/2ML IJ SOLN
INTRAMUSCULAR | Status: DC | PRN
Start: 2019-09-03 — End: 2019-09-03
  Administered 2019-09-03: 4 mg via INTRAVENOUS

## 2019-09-03 MED ORDER — CELECOXIB 200 MG PO CAPS
ORAL_CAPSULE | ORAL | Status: AC
  Filled 2019-09-03: qty 1

## 2019-09-03 MED ORDER — SILVER NITRATE-POT NITRATE 75-25 % EX MISC
CUTANEOUS | Status: AC
  Filled 2019-09-03: qty 20

## 2019-09-03 MED ORDER — ACETAMINOPHEN 500 MG PO TABS
1000.0000 mg | ORAL_TABLET | Freq: Once | ORAL | Status: AC
  Administered 2019-09-03: 1000 mg via ORAL

## 2019-09-03 MED ORDER — FENTANYL CITRATE (PF) 100 MCG/2ML IJ SOLN
INTRAMUSCULAR | Status: AC
  Filled 2019-09-03: qty 2

## 2019-09-03 MED ORDER — BUPIVACAINE HCL (PF) 0.25 % IJ SOLN
INTRAMUSCULAR | Status: DC | PRN
  Administered 2019-09-03: 20 mL

## 2019-09-03 MED ORDER — SILVER NITRATE-POT NITRATE 75-25 % EX MISC
CUTANEOUS | Status: AC
  Filled 2019-09-03: qty 10

## 2019-09-03 MED ORDER — DEXAMETHASONE SODIUM PHOSPHATE 4 MG/ML IJ SOLN
INTRAMUSCULAR | Status: DC | PRN
  Administered 2019-09-03: 4 mg via INTRAVENOUS

## 2019-09-03 MED ORDER — PROPOFOL 10 MG/ML IV BOLUS: INTRAVENOUS | Status: DC | PRN

## 2019-09-03 MED ORDER — HYDROCODONE-ACETAMINOPHEN 5-325 MG PO TABS: 1.0000 | ORAL_TABLET | Freq: Four times a day (QID) | ORAL | 0 refills | Status: DC | PRN

## 2019-09-03 MED ORDER — PROPOFOL 500 MG/50ML IV EMUL
INTRAVENOUS | Status: DC | PRN
  Administered 2019-09-03: 25 ug/kg/min via INTRAVENOUS

## 2019-09-03 MED ORDER — BACITRACIN ZINC 500 UNIT/GM EX OINT
TOPICAL_OINTMENT | CUTANEOUS | Status: DC | PRN
  Administered 2019-09-03: 1 via TOPICAL

## 2019-09-03 SURGICAL SUPPLY — 70 items
ADH SKN CLS APL DERMABOND .7 (GAUZE/BANDAGES/DRESSINGS)
APL SKNCLS STERI-STRIP NONHPOA (GAUZE/BANDAGES/DRESSINGS)
BAG DECANTER FOR FLEXI CONT (MISCELLANEOUS) IMPLANT
BALL CTTN LRG ABS STRL LF (GAUZE/BANDAGES/DRESSINGS)
BENZOIN TINCTURE PRP APPL 2/3 (GAUZE/BANDAGES/DRESSINGS) IMPLANT
BLADE CLIPPER SURG (BLADE) IMPLANT
BLADE DERMATOME SS (BLADE) IMPLANT
BLADE HEX COATED 2.75 (ELECTRODE) IMPLANT
BLADE SURG 10 STRL SS (BLADE) ×2 IMPLANT
BLADE SURG 15 STRL LF DISP TIS (BLADE) ×2 IMPLANT
BLADE SURG 15 STRL SS (BLADE) ×3
BNDG COHESIVE 4X5 TAN STRL (GAUZE/BANDAGES/DRESSINGS) IMPLANT
BNDG ELASTIC 4X5.8 VLCR STR LF (GAUZE/BANDAGES/DRESSINGS) IMPLANT
BRIEF STRETCH FOR OB PAD XXL (UNDERPADS AND DIAPERS) ×2 IMPLANT
CANISTER SUCT 1200ML W/VALVE (MISCELLANEOUS) ×2 IMPLANT
COTTONBALL LRG STERILE PKG (GAUZE/BANDAGES/DRESSINGS) ×2 IMPLANT
COVER BACK TABLE 60X90IN (DRAPES) ×3 IMPLANT
COVER MAYO STAND STRL (DRAPES) ×3 IMPLANT
COVER WAND RF STERILE (DRAPES) IMPLANT
DECANTER SPIKE VIAL GLASS SM (MISCELLANEOUS) IMPLANT
DERMABOND ADVANCED (GAUZE/BANDAGES/DRESSINGS)
DERMABOND ADVANCED .7 DNX12 (GAUZE/BANDAGES/DRESSINGS) IMPLANT
DERMACARRIERS GRAFT 1 TO 1.5 (DISPOSABLE)
DRAIN PENROSE 1/4X12 LTX STRL (WOUND CARE) ×2 IMPLANT
DRAPE LAPAROTOMY 100X72 PEDS (DRAPES) ×2 IMPLANT
DRAPE U-SHAPE 76X120 STRL (DRAPES) IMPLANT
DRSG EMULSION OIL 3X3 NADH (GAUZE/BANDAGES/DRESSINGS) ×2 IMPLANT
DRSG PAD ABDOMINAL 8X10 ST (GAUZE/BANDAGES/DRESSINGS) ×2 IMPLANT
DRSG TEGADERM 4X10 (GAUZE/BANDAGES/DRESSINGS) IMPLANT
ELECT REM PT RETURN 9FT ADLT (ELECTROSURGICAL) ×3
ELECTRODE REM PT RTRN 9FT ADLT (ELECTROSURGICAL) ×1 IMPLANT
GAUZE 4X4 16PLY RFD (DISPOSABLE) IMPLANT
GAUZE SPONGE 4X4 12PLY STRL (GAUZE/BANDAGES/DRESSINGS) ×6 IMPLANT
GAUZE XEROFORM 5X9 LF (GAUZE/BANDAGES/DRESSINGS) ×1 IMPLANT
GLOVE BIOGEL M STRL SZ7.5 (GLOVE) ×5 IMPLANT
GLOVE BIOGEL PI IND STRL 7.0 (GLOVE) ×2 IMPLANT
GLOVE BIOGEL PI IND STRL 8 (GLOVE) ×3 IMPLANT
GLOVE BIOGEL PI INDICATOR 7.0 (GLOVE) ×2
GLOVE BIOGEL PI INDICATOR 8 (GLOVE) ×2
GLOVE ECLIPSE 7.0 STRL STRAW (GLOVE) ×2 IMPLANT
GOWN STRL REUS W/ TWL LRG LVL3 (GOWN DISPOSABLE) ×4 IMPLANT
GOWN STRL REUS W/TWL LRG LVL3 (GOWN DISPOSABLE) ×9
GRAFT DERMACARRIERS 1 TO 1.5 (DISPOSABLE) IMPLANT
NDL PRECISIONGLIDE 27X1.5 (NEEDLE) ×1 IMPLANT
NEEDLE PRECISIONGLIDE 27X1.5 (NEEDLE) ×3 IMPLANT
NS IRRIG 1000ML POUR BTL (IV SOLUTION) ×3 IMPLANT
PACK BASIN DAY SURGERY FS (CUSTOM PROCEDURE TRAY) ×3 IMPLANT
PACK LITHOTOMY IV (CUSTOM PROCEDURE TRAY) ×2 IMPLANT
PAD CAST 4YDX4 CTTN HI CHSV (CAST SUPPLIES) IMPLANT
PADDING CAST COTTON 4X4 STRL (CAST SUPPLIES)
PENCIL SMOKE EVACUATOR (MISCELLANEOUS) ×2 IMPLANT
SHEET MEDIUM DRAPE 40X70 STRL (DRAPES) IMPLANT
SPONGE LAP 18X18 RF (DISPOSABLE) ×2 IMPLANT
STRIP CLOSURE SKIN 1/2X4 (GAUZE/BANDAGES/DRESSINGS) IMPLANT
SUCTION FRAZIER HANDLE 10FR (MISCELLANEOUS)
SUCTION TUBE FRAZIER 10FR DISP (MISCELLANEOUS) IMPLANT
SUT CHROMIC 5 0 P 3 (SUTURE) IMPLANT
SUT ETHILON 4 0 P 3 18 (SUTURE) IMPLANT
SUT MON AB 4-0 PC3 18 (SUTURE) IMPLANT
SUT MON AB 5-0 PS2 18 (SUTURE) IMPLANT
SUT PROLENE 4 0 P 3 18 (SUTURE) IMPLANT
SUT VIC AB 3-0 FS2 27 (SUTURE) ×4 IMPLANT
SUT VICRYL 4-0 PS2 18IN ABS (SUTURE) IMPLANT
SYR BULB EAR ULCER 3OZ GRN STR (SYRINGE) ×3 IMPLANT
SYR CONTROL 10ML LL (SYRINGE) ×3 IMPLANT
TOWEL GREEN STERILE FF (TOWEL DISPOSABLE) ×3 IMPLANT
TRAY DSU PREP LF (CUSTOM PROCEDURE TRAY) ×3 IMPLANT
TUBE CONNECTING 20X1/4 (TUBING) ×2 IMPLANT
UNDERPAD 30X36 HEAVY ABSORB (UNDERPADS AND DIAPERS) IMPLANT
YANKAUER SUCT BULB TIP NO VENT (SUCTIONS) ×2 IMPLANT

## 2019-09-03 NOTE — Anesthesia Postprocedure Evaluation (Signed)
Anesthesia Post Note  Patient: Martin Mullen  Procedure(s) Performed: Debridement and closure of scrotal wound (N/A Groin)     Patient location during evaluation: PACU Anesthesia Type: General Level of consciousness: sedated Pain management: pain level controlled Vital Signs Assessment: post-procedure vital signs reviewed and stable Respiratory status: spontaneous breathing and respiratory function stable Cardiovascular status: stable Postop Assessment: no apparent nausea or vomiting Anesthetic complications: no   No complications documented.  Last Vitals:  Vitals:   09/03/19 1135 09/03/19 1145  BP:  139/83  Pulse: 74 76  Resp: 11 10  Temp:    SpO2: 94% 98%    Last Pain:  Vitals:   09/03/19 1145  TempSrc:   PainSc: 0-No pain                 Masiya Claassen DANIEL

## 2019-09-03 NOTE — Discharge Instructions (Signed)
Activity As tolerated:  NO driving No heavy activities  Diet: Regular  Wound Care: Keep dressing clean & dry. Change dressing daily or as needed based on soil level. Apply KY jelly daily to wound.   Call Doctor if any unusual problems occur such as pain, excessive Bleeding, unrelieved Nausea/vomiting, Fever &/or chills   Post Anesthesia Home Care Instructions  Activity: Get plenty of rest for the remainder of the day. A responsible individual must stay with you for 24 hours following the procedure.  For the next 24 hours, DO NOT: -Drive a car -Advertising copywriter -Drink alcoholic beverages -Take any medication unless instructed by your physician -Make any legal decisions or sign important papers.  Meals: Start with liquid foods such as gelatin or soup. Progress to regular foods as tolerated. Avoid greasy, spicy, heavy foods. If nausea and/or vomiting occur, drink only clear liquids until the nausea and/or vomiting subsides. Call your physician if vomiting continues.  Special Instructions/Symptoms: Your throat may feel dry or sore from the anesthesia or the breathing tube placed in your throat during surgery. If this causes discomfort, gargle with warm salt water. The discomfort should disappear within 24 hours.  If you had a scopolamine patch placed behind your ear for the management of post- operative nausea and/or vomiting:  1. The medication in the patch is effective for 72 hours, after which it should be removed.  Wrap patch in a tissue and discard in the trash. Wash hands thoroughly with soap and water. 2. You may remove the patch earlier than 72 hours if you experience unpleasant side effects which may include dry mouth, dizziness or visual disturbances. 3. Avoid touching the patch. Wash your hands with soap and water after contact with the patch.

## 2019-09-03 NOTE — Brief Op Note (Signed)
09/03/2019  11:11 AM  PATIENT:  Martin Mullen  39 y.o. male  PRE-OPERATIVE DIAGNOSIS:  Fournier's Gangrene Of Scrotum  POST-OPERATIVE DIAGNOSIS:  Fournier's Gangrene Of Scrotum  PROCEDURE:  Procedure(s): Debridement and closure of scrotal wound (N/A)  SURGEON:  Surgeon(s) and Role:    * Nezar Buckles, Wendy Poet, MD - Primary  PHYSICIAN ASSISTANT: Materials engineer, PA  ASSISTANTS: none   ANESTHESIA:   general  EBL:  20   BLOOD ADMINISTERED:none  DRAINS: Penrose drain in the scrotum   LOCAL MEDICATIONS USED:  MARCAINE     SPECIMEN:  No Specimen  DISPOSITION OF SPECIMEN:  PATHOLOGY  COUNTS:  YES  TOURNIQUET:  * No tourniquets in log *  DICTATION: .Dragon Dictation  PLAN OF CARE: Discharge to home after PACU  PATIENT DISPOSITION:  PACU - hemodynamically stable.   Delay start of Pharmacological VTE agent (>24hrs) due to surgical blood loss or risk of bleeding: yes

## 2019-09-03 NOTE — Transfer of Care (Signed)
Immediate Anesthesia Transfer of Care Note  Patient: Martin Mullen  Procedure(s) Performed: Debridement and closure of scrotal wound (N/A Groin)  Patient Location: PACU  Anesthesia Type:General  Level of Consciousness: drowsy and patient cooperative  Airway & Oxygen Therapy: Patient Spontanous Breathing and Patient connected to face mask oxygen  Post-op Assessment: Report given to RN and Post -op Vital signs reviewed and stable  Post vital signs: Reviewed and stable  Last Vitals:  Vitals Value Taken Time  BP 96/61 09/03/19 1115  Temp 36.8 C 09/03/19 1115  Pulse 68 09/03/19 1115  Resp 10 09/03/19 1115  SpO2 96 % 09/03/19 1115  Vitals shown include unvalidated device data.  Last Pain:  Vitals:   09/03/19 0728  TempSrc: Oral  PainSc: 0-No pain         Complications: No complications documented.

## 2019-09-03 NOTE — Op Note (Signed)
Operative Note   DATE OF OPERATION: 09/03/2019  SURGICAL DEPARTMENT: Plastic Surgery  PREOPERATIVE DIAGNOSES: Scrotal wound  POSTOPERATIVE DIAGNOSES:  same  PROCEDURE: 1.  Debridement of scrotal wound 10 x 3 cm 2.  Closure of scrotal wound with adjacent tissue transfer totaling 12 x 6 cm including the primary and secondary defect.  SURGEON: Ancil Linsey, MD  ASSISTANT: Zadie Cleverly, PA The advanced practice practitioner (APP) assisted throughout the case.  The APP was essential in retraction and counter traction when needed to make the case progress smoothly.  This retraction and assistance made it possible to see the tissue plans for the procedure.  The assistance was needed for blood control, tissue re-approximation and assisted with closure of the incision site.  ANESTHESIA:  General.   COMPLICATIONS: None.   INDICATIONS FOR PROCEDURE:  The patient, Martin Mullen is a 39 y.o. male born on 14-Nov-1980, is here for treatment of scrotal wound after debridement for Fournier's gangrene.  He has healed well but has persistent exposure of his right testicle.  He presents for definitive debridement and closure. MRN: [id number]  CONSENT:  Informed consent was obtained directly from the patient. Risks, benefits and alternatives were fully discussed. Specific risks including but not limited to bleeding, infection, hematoma, seroma, scarring, pain, contracture, asymmetry, wound healing problems, and need for further surgery were all discussed. The patient did have an ample opportunity to have questions answered to satisfaction.   DESCRIPTION OF PROCEDURE:  The patient was taken to the operating room. SCDs were placed and Ancef antibiotics were given.  General anesthesia was administered.  The patient's operative site was prepped and draped in a sterile fashion. A time out was performed and all information was confirmed to be correct.  The wound was examined and found to be clean.   Circumferential debridement was performed including the skin edges.  It was irrigated.  Quarter percent Marcaine was injected circumferentially.  The skin edges were debrided with pickups and scissors for an excisional debridement.  The granulation tissue over the testicle was cautiously debrided as well.  I then undermined the scrotal skin circumferentially for 3 to 4 cm in all directions.  Hemostasis was obtained with cautery.  This gave more than enough skin to close the wound primarily.  A Penrose drain was left coming out posteriorly and secured with 3-0 Vicryl sutures.  The remainder the incision was closed primarily with interrupted 3-0 Vicryl mattress sutures.  There was minimal tension on the repair.  He was then dressed with a soft dressing.  The patient tolerated the procedure well.  There were no complications. The patient was allowed to wake from anesthesia, extubated and taken to the recovery room in satisfactory condition.

## 2019-09-03 NOTE — Interval H&P Note (Signed)
History and Physical Interval Note:  09/03/2019 7:21 AM  Martin Mullen  has presented today for surgery, with the diagnosis of Fournier's Gangrene Of Scrotum.  The various methods of treatment have been discussed with the patient and family. After consideration of risks, benefits and other options for treatment, the patient has consented to  Procedure(s) with comments: Debridement and closure of scrotal wound (N/A) - total time needed is 1 hour with adjacent tissue transfer (N/A) possible skin graft (N/A) as a surgical intervention.  The patient's history has been reviewed, patient examined, no change in status, stable for surgery.  I have reviewed the patient's chart and labs.  Questions were answered to the patient's satisfaction.     Allena Napoleon

## 2019-09-03 NOTE — Anesthesia Procedure Notes (Signed)
Procedure Name: LMA Insertion Date/Time: 09/03/2019 10:14 AM Performed by: Sheryn Bison, CRNA Pre-anesthesia Checklist: Patient identified, Emergency Drugs available, Suction available and Patient being monitored Patient Re-evaluated:Patient Re-evaluated prior to induction Oxygen Delivery Method: Circle system utilized Preoxygenation: Pre-oxygenation with 100% oxygen Induction Type: IV induction Ventilation: Mask ventilation without difficulty LMA: LMA inserted LMA Size: 4.0 Number of attempts: 1 Airway Equipment and Method: Bite block Placement Confirmation: positive ETCO2 Tube secured with: Tape Dental Injury: Teeth and Oropharynx as per pre-operative assessment

## 2019-09-03 NOTE — Anesthesia Preprocedure Evaluation (Addendum)
Anesthesia Evaluation  Patient identified by MRN, date of birth, ID band Patient awake    Reviewed: Allergy & Precautions, H&P , NPO status , Patient's Chart, lab work & pertinent test results  History of Anesthesia Complications Negative for: history of anesthetic complications  Airway Mallampati: I  TM Distance: >3 FB Neck ROM: Full    Dental  (+) Edentulous Upper, Edentulous Lower   Pulmonary neg shortness of breath, neg COPD, neg recent URI, Current Smoker and Patient abstained from smoking.,    Pulmonary exam normal        Cardiovascular hypertension, (-) angina+ CAD, + Past MI and + Cardiac Stents (2014)  (-) CABG Normal cardiovascular exam(-) dysrhythmias (-) Valvular Problems/Murmurs  Study Conclusions   - Left ventricle: The cavity size was normal. There was mild  concentric hypertrophy. Systolic function was normal. The  estimated ejection fraction was in the range of 55% to 60%. Wall  motion was normal; there were no regional wall motion  abnormalities.  - Left atrium: The atrium was mildly dilated.    Neuro/Psych negative neurological ROS  negative psych ROS   GI/Hepatic Neg liver ROS, GERD  ,  Endo/Other  diabetes, Poorly Controlled  Renal/GU      Musculoskeletal   Abdominal   Peds  Hematology negative hematology ROS (+)   Anesthesia Other Findings   Reproductive/Obstetrics negative OB ROS                            Anesthesia Physical  Anesthesia Plan  ASA: III  Anesthesia Plan: General   Post-op Pain Management:    Induction: Intravenous  PONV Risk Score and Plan: 3 and Ondansetron, Dexamethasone, Treatment may vary due to age or medical condition and Midazolam  Airway Management Planned: LMA  Additional Equipment:   Intra-op Plan:   Post-operative Plan: Extubation in OR  Informed Consent: I have reviewed the patients History and Physical, chart,  labs and discussed the procedure including the risks, benefits and alternatives for the proposed anesthesia with the patient or authorized representative who has indicated his/her understanding and acceptance.       Plan Discussed with: Anesthesiologist and CRNA  Anesthesia Plan Comments:        Anesthesia Quick Evaluation

## 2019-09-04 ENCOUNTER — Encounter (HOSPITAL_BASED_OUTPATIENT_CLINIC_OR_DEPARTMENT_OTHER): Payer: Self-pay | Admitting: Plastic Surgery

## 2019-09-11 ENCOUNTER — Encounter: Payer: Self-pay | Admitting: Plastic Surgery

## 2019-09-11 ENCOUNTER — Ambulatory Visit (INDEPENDENT_AMBULATORY_CARE_PROVIDER_SITE_OTHER): Payer: BC Managed Care – PPO | Admitting: Plastic Surgery

## 2019-09-11 ENCOUNTER — Other Ambulatory Visit: Payer: Self-pay

## 2019-09-11 VITALS — BP 139/96 | HR 92 | Temp 98.1°F

## 2019-09-11 DIAGNOSIS — N493 Fournier gangrene: Secondary | ICD-10-CM

## 2019-09-11 NOTE — Progress Notes (Signed)
Patient is here 1 week out from closure of his scrotum with adjacent tissue transfer.  He said his drain fell out a few days ago.  He feels like things are going well and has minimal pain.  On exam the incision is still closed and intact.  He does have a raw area posteriorly that was there before and has a small opening where the drain was coming out.  Otherwise his incision is intact with no signs of the wound complication.  We will plan to continue to dress the open areas as he is doing and I will follow up with him again in 3 weeks.  All of his questions were answered.

## 2019-09-16 ENCOUNTER — Encounter: Payer: Self-pay | Admitting: Plastic Surgery

## 2019-09-17 ENCOUNTER — Other Ambulatory Visit: Payer: Self-pay | Admitting: Internal Medicine

## 2019-09-20 ENCOUNTER — Telehealth: Payer: Self-pay | Admitting: Internal Medicine

## 2019-09-20 NOTE — Telephone Encounter (Signed)
Patient is requesting to speak with [name] to discuss extending short term disability. Please advise.

## 2019-09-25 NOTE — Telephone Encounter (Signed)
Left message on voicemail.

## 2019-09-27 ENCOUNTER — Encounter: Payer: BC Managed Care – PPO | Admitting: Internal Medicine

## 2019-10-07 ENCOUNTER — Other Ambulatory Visit: Payer: Self-pay

## 2019-10-07 ENCOUNTER — Encounter: Payer: Self-pay | Admitting: Surgical

## 2019-10-07 ENCOUNTER — Ambulatory Visit (INDEPENDENT_AMBULATORY_CARE_PROVIDER_SITE_OTHER): Payer: BC Managed Care – PPO | Admitting: Surgical

## 2019-10-07 VITALS — BP 150/92 | HR 82 | Temp 97.7°F

## 2019-10-07 DIAGNOSIS — N493 Fournier gangrene: Secondary | ICD-10-CM

## 2019-10-07 NOTE — Progress Notes (Signed)
    Subjective:     Patient ID: Martin Mullen, male    DOB: 23-Jan-1981, 39 y.o.   MRN: [id number]  Chief Complaint  Patient presents with  . Follow-up    HPI: Patient is a 39 year old male here for follow-up after closure of his scrotum with adjacent tissue transfer with Dr. [name] on 09/03/2019.  He reports that he is doing well, pain has been significantly better.  He reports only having pain with extensive movement or transitioning in and out of the car.  He has been applying medicated Vaseline and gauze daily.   He is not having any fevers or chills.  He feels well otherwise.  Review of Systems  Constitutional: Negative for activity change, chills, fatigue and fever.  Genitourinary: Negative for penile pain, penile swelling and scrotal swelling.  Skin: Positive for wound. Negative for color change.     Objective:   Vital Signs BP (!) 150/92 (BP Location: Left Arm, Patient Position: Sitting, Cuff Size: Large)   Pulse 82   Temp 97.7 F (36.5 C) (Oral)   SpO2 98%  Vital Signs and Nursing Note Reviewed Chaperone present Physical Exam Constitutional:      General: He is not in acute distress.    Appearance: Normal appearance. He is not ill-appearing.  HENT:     Head: Normocephalic and atraumatic.  Pulmonary:     Effort: Pulmonary effort is normal.  Neurological:     Mental Status: He is alert.  Psychiatric:        Mood and Affect: Mood normal.        Behavior: Behavior normal.    On exam scrotal incision is intact, small wound from where the drain was that is approximately 1 mm in diameter noted without any foul odor or purulent drainage.  He had a nonstick gauze in place that has some serosanguineous drainage.  He does continue to have the raw area posteriorly.  This appears clean.  There is no fluid collections noted, erythema, swelling.  No tenderness to palpation.  Assessment/Plan:     ICD-10-CM   1. Fournier's gangrene of scrotum  N49.3     Discussed with  patient that he can shower, continue to apply Vaseline or bacitracin daily cover with gauze.  No sign of any infection or complications.  Recommend continuing to stay out of work for 1 more month. We will follow up in 1 month to reevaluate.  Recommend calling with any questions or concerns.   Kermit Balo Jamayia Croker, PA-C 10/07/2019, 1:31 PM

## 2019-10-15 ENCOUNTER — Telehealth: Payer: Self-pay | Admitting: Internal Medicine

## 2019-10-15 NOTE — Telephone Encounter (Signed)
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

## 2019-10-15 NOTE — Telephone Encounter (Signed)
This needs to be decided by the treating surgeon

## 2019-10-18 NOTE — Telephone Encounter (Signed)
Pt is aware, form faxed to plastic surgeon

## 2019-11-06 LAB — HM DIABETES EYE EXAM

## 2019-11-07 ENCOUNTER — Encounter: Payer: Self-pay | Admitting: Surgical

## 2019-11-07 ENCOUNTER — Ambulatory Visit (INDEPENDENT_AMBULATORY_CARE_PROVIDER_SITE_OTHER): Payer: BC Managed Care – PPO | Admitting: Surgical

## 2019-11-07 ENCOUNTER — Other Ambulatory Visit: Payer: Self-pay

## 2019-11-07 VITALS — BP 150/97 | HR 97 | Temp 98.0°F

## 2019-11-07 DIAGNOSIS — N493 Fournier gangrene: Secondary | ICD-10-CM

## 2019-11-07 NOTE — Progress Notes (Signed)
   Subjective:     Patient ID: Martin Mullen, male    DOB: 28-Mar-1980, 39 y.o.   MRN: [id number]  Chief Complaint  Patient presents with  . Follow-up    HPI: The patient is a 39 y.o. male here for follow-up after closure of his scrotal wound with adjacent tissue transfer with Dr. [name] on 09/03/2019.  Patient reports he is doing well, pain has improved, only having some pain with transitioning but is otherwise doing well.  He would like to return to work.  No f/c/n/v.  Patient reports he is interested in consultation with Dr. [name] for excess breast tissue.   Review of Systems  Constitutional: Negative.   Genitourinary: Negative for penile swelling and scrotal swelling.  Skin: Negative.      Objective:   Vital Signs BP (!) 150/97 (BP Location: Left Arm, Patient Position: Sitting, Cuff Size: Large)   Pulse 97   Temp 98 F (36.7 C) (Oral)   SpO2 97%  Vital Signs and Nursing Note Reviewed Physical Exam Constitutional:      Appearance: Normal appearance.  HENT:     Head: Normocephalic and atraumatic.  Pulmonary:     Effort: Pulmonary effort is normal.  Genitourinary:    Comments: No scrotal wound noted, no erythema, no swelling, nontender to palpation, no cellulitic changes, scrotal incision intact without any drainage. Neurological:     General: No focal deficit present.     Mental Status: He is alert and oriented to person, place, and time.  Psychiatric:        Mood and Affect: Mood normal.        Behavior: Behavior normal.     Assessment/Plan:     ICD-10-CM   1. Fournier's gangrene of scrotum  N49.3     Incision is well-healed, no further wound care recommendations necessary.  There is no sign of infection. Patient can return to work with no restrictions at this time.  We provided him with a work note.  Patient would like to have a consultation with Dr. [name] for evaluation of gynecomastia.  I discussed with patient that we can set this up for consultation with Dr.  [name].   Recommend calling with questions or concerns. Follow up as needed.   Kermit Balo Finneus Kaneshiro, PA-C 11/07/2019, 3:01 PM

## 2019-11-13 ENCOUNTER — Encounter: Payer: Self-pay | Admitting: Internal Medicine

## 2019-11-13 ENCOUNTER — Other Ambulatory Visit: Payer: Self-pay

## 2019-11-13 ENCOUNTER — Ambulatory Visit (INDEPENDENT_AMBULATORY_CARE_PROVIDER_SITE_OTHER): Payer: BC Managed Care – PPO | Admitting: Internal Medicine

## 2019-11-13 VITALS — BP 138/88 | HR 86 | Temp 98.0°F | Ht 69.5 in | Wt 253.0 lb

## 2019-11-13 DIAGNOSIS — Z0000 Encounter for general adult medical examination without abnormal findings: Secondary | ICD-10-CM

## 2019-11-13 DIAGNOSIS — Z23 Encounter for immunization: Secondary | ICD-10-CM

## 2019-11-13 DIAGNOSIS — I209 Angina pectoris, unspecified: Secondary | ICD-10-CM

## 2019-11-13 DIAGNOSIS — K219 Gastro-esophageal reflux disease without esophagitis: Secondary | ICD-10-CM

## 2019-11-13 DIAGNOSIS — I213 ST elevation (STEMI) myocardial infarction of unspecified site: Secondary | ICD-10-CM | POA: Diagnosis not present

## 2019-11-13 DIAGNOSIS — E1169 Type 2 diabetes mellitus with other specified complication: Secondary | ICD-10-CM | POA: Diagnosis not present

## 2019-11-13 DIAGNOSIS — E785 Hyperlipidemia, unspecified: Secondary | ICD-10-CM

## 2019-11-13 DIAGNOSIS — F4323 Adjustment disorder with mixed anxiety and depressed mood: Secondary | ICD-10-CM

## 2019-11-13 DIAGNOSIS — I2510 Atherosclerotic heart disease of native coronary artery without angina pectoris: Secondary | ICD-10-CM

## 2019-11-13 DIAGNOSIS — I10 Essential (primary) hypertension: Secondary | ICD-10-CM | POA: Diagnosis not present

## 2019-11-13 MED ORDER — NITROGLYCERIN 0.4 MG SL SUBL: SUBLINGUAL_TABLET | SUBLINGUAL | 2 refills | Status: DC

## 2019-11-13 NOTE — Assessment & Plan Note (Signed)
Avoid EtOH Continue Omeprazole CBC and C met today

## 2019-11-13 NOTE — Assessment & Plan Note (Signed)
No angina.  Continue Simvastatin and Fish Oil Encouraged low fat diet and exercise for weight loss 

## 2019-11-13 NOTE — Assessment & Plan Note (Signed)
Reasonable control on Losartan, will continue CMET today Reinforced DASH diet and exercise for weight loss

## 2019-11-13 NOTE — Assessment & Plan Note (Signed)
No angina.  Continue Simvastatin and Fish Oil Encouraged low fat diet and exercise for weight loss

## 2019-11-13 NOTE — Assessment & Plan Note (Signed)
Stable off meds We will monitor 

## 2019-11-13 NOTE — Patient Instructions (Signed)

## 2019-11-13 NOTE — Progress Notes (Signed)
Subjective:    Patient ID: Martin Mullen, male    DOB: 05/09/1980, 39 y.o.   MRN: [id number]  HPI  Patient presents the clinic today for his annual exam.  He is also due to follow-up chronic conditions.  HTN: His BP today is 138/88.  He is taking Losartan as prescribed.  ECG from 07/2019 reviewed.  HLD with CAD status post STEMI: His last LDL was 89, triglycerides 176, 09/2018,.  He denies myalgias on Simvastatin.  He is taking Fish Oil OTC as well.  He does not consume a low-fat diet.  DM2: His last A1c was 7.5%, 02/2019  His sugars range 120-270.  He is taking Lantus,  Metformin and Januvia as prescribed.  He does not check his feet routinely.  His last eye exam was 10/2019.  Anxiety and Depression: Chronic, currently not taking any medications for this.  He is not seeing a therapist.  He denies SI/HI.  GERD: Triggered by ETOH. He denies breakthrough on Omeprazole. He has never had an upper GI.  Fournier's Gangrene of Scrotum: Healed, he has been released by plastics. He noticed an area of redness with a head on his scrotum 3 days ago. He reports this has drained pus. He denies fever, chills or body aches.   Flu: 01/2017  Tetanus: 06/2012 Pneumovax: 06/2017 Covid: never Vision screen: annually Dentist: dentures  Diet: He does eat meat. He consumes fruits and veggies daily. He tries to avoid fried foods. He drinks mostly water, some Mt. Dew. Exercise: None  Review of Systems      Past Medical History:  Diagnosis Date  . Acute transmural inferior wall MI ([place]) 08/28/2012   .5 x 12 mm Veri-flex stent non-DES.  Marland Kitchen Chicken pox   . Coronary artery disease    Inferior ST elevation myocardial infarction in July of 2014. Cardiac catheterization showed 95% distal RCA stenosis and 90% first diagonal stenosis with normal ejection fraction. He underwent PCI in bare-metal stent placement to the distal RCA.  . Diabetes mellitus without complication ([place])   . Hypercholesterolemia   .  Tobacco use     Current Outpatient Medications  Medication Sig Dispense Refill  . aspirin 81 MG EC tablet Take 1 tablet (81 mg total) by mouth daily. 30 tablet 4  . Blood Glucose Monitoring Suppl (BAYER CONTOUR NEXT MONITOR) w/Device KIT 1 Device 1 kit 0  . CONTOUR NEXT TEST test strip USE 1 STRIP TO CHECK GLUCOSE THREE TIMES DAILY AS NEEDED 100 each 0  . HYDROcodone-acetaminophen (NORCO) 5-325 MG tablet Take 1 tablet by mouth every 6 (six) hours as needed for moderate pain. 30 tablet 0  . insulin glargine (LANTUS SOLOSTAR) 100 UNIT/ML Solostar Pen Inject 23 Units into the skin daily. 15 pen 1  . Insulin Lispro-aabc, 1 U Dial, (LYUMJEV KWIKPEN) 100 UNIT/ML SOPN Inject 6 Units into the skin 3 (three) times daily before meals. 1 pen 0  . Insulin Pen Needle 31G X 8 MM MISC 1 each by Does not apply route daily. 90 each 1  . JANUVIA 100 MG tablet TAKE 1 TABLET(100 MG) BY MOUTH DAILY 90 tablet 0  . losartan (COZAAR) 50 MG tablet Take 1 tablet (50 mg total) by mouth daily. 30 tablet 0  . metFORMIN (GLUCOPHAGE) 1000 MG tablet TAKE 1 TABLET BY MOUTH TWICE DAILY WITH A MEAL (Patient taking differently: Take 1,000 mg by mouth 2 (two) times daily with a meal. TAKE 1 TABLET BY MOUTH TWICE DAILY WITH A MEAL) 180  tablet 0  . nicotine (NICODERM CQ - DOSED IN MG/24 HOURS) 21 mg/24hr patch One 15m patch every day to chest wall (okay to substitute generic) 28 patch 0  . nitroGLYCERIN (NITROSTAT) 0.4 MG SL tablet DISSOLVE ONE TABLET UNDER THE TONGUE EVERY 5 MINUTES AS NEEDED FOR CHEST PAIN.  DO NOT EXCEED A TOTAL OF 3 DOSES IN 15 MINUTES 25 tablet 12  . omeprazole (PRILOSEC) 20 MG capsule Take 1 capsule (20 mg total) by mouth daily. 30 capsule 3  . simvastatin (ZOCOR) 80 MG tablet Take 1 tablet (80 mg total) by mouth daily. 90 tablet 3   No current facility-administered medications for this visit.    No Known Allergies  Family History  Problem Relation Age of Onset  . Arthritis Mother        Rheumatoid  .  Diabetes Mother   . Hyperlipidemia Mother   . Diabetes Father   . Cancer Maternal Grandfather        Prostate  . Parkinson's disease Maternal Grandfather     Social History   Socioeconomic History  . Marital status: Married    Spouse name: Not on file  . Number of children: Not on file  . Years of education: Not on file  . Highest education level: Not on file  Occupational History  . Not on file  Tobacco Use  . Smoking status: Current Every Day Smoker    Packs/day: 1.50    Years: 20.00    Pack years: 30.00    Types: Cigarettes  . Smokeless tobacco: Never Used  Substance and Sexual Activity  . Alcohol use: Yes    Alcohol/week: 0.0 standard drinks    Comment: occasional  . Drug use: Yes    Types: Marijuana  . Sexual activity: Yes    Birth control/protection: None  Other Topics Concern  . Not on file  Social History Narrative  . Not on file   Social Determinants of Health   Financial Resource Strain:   . Difficulty of Paying Living Expenses: Not on file  Food Insecurity:   . Worried About RCharity fundraiserin the Last Year: Not on file  . Ran Out of Food in the Last Year: Not on file  Transportation Needs:   . Lack of Transportation (Medical): Not on file  . Lack of Transportation (Non-Medical): Not on file  Physical Activity:   . Days of Exercise per Week: Not on file  . Minutes of Exercise per Session: Not on file  Stress:   . Feeling of Stress : Not on file  Social Connections:   . Frequency of Communication with Friends and Family: Not on file  . Frequency of Social Gatherings with Friends and Family: Not on file  . Attends Religious Services: Not on file  . Active Member of Clubs or Organizations: Not on file  . Attends CArchivistMeetings: Not on file  . Marital Status: Not on file  Intimate Partner Violence:   . Fear of Current or Ex-Partner: Not on file  . Emotionally Abused: Not on file  . Physically Abused: Not on file  . Sexually  Abused: Not on file     Constitutional: Denies fever, malaise, fatigue, headache or abrupt weight changes.  HEENT: Denies eye pain, eye redness, ear pain, ringing in the ears, wax buildup, runny nose, nasal congestion, bloody nose, or sore throat. Respiratory: Denies difficulty breathing, shortness of breath, cough or sputum production.   Cardiovascular: Denies chest pain, chest  tightness, palpitations or swelling in the hands or feet.  Gastrointestinal: Denies abdominal pain, bloating, constipation, diarrhea or blood in the stool.  GU: Denies urgency, frequency, pain with urination, burning sensation, blood in urine, odor or discharge. Musculoskeletal: Denies decrease in range of motion, difficulty with gait, muscle pain or joint pain and swelling.  Skin: Pt reports redness of scrotum. Denies rashes, lesions or ulcercations.  Neurological: Denies dizziness, difficulty with memory, difficulty with speech or problems with balance and coordination.  Psych: Patient has a history of anxiety and depression.  Denies SI/HI.  No other specific complaints in a complete review of systems (except as listed in HPI above).  Objective:   Physical Exam  BP 138/88   Pulse 86   Temp 98 F (36.7 C) (Temporal)   Ht 5' 9.5" (1.765 m)   Wt 253 lb (114.8 kg)   SpO2 97%   BMI 36.83 kg/m   Wt Readings from Last 3 Encounters:  09/03/19 241 lb 13.5 oz (109.7 kg)  08/01/19 256 lb (116.1 kg)  07/29/19 256 lb (116.1 kg)    General: Appears his stated age, obese, in NAD. Skin: Warm, dry and intact. Open area to inferior scrotum noted, no foul odor or redness. HEENT: Head: normal shape and size; Eyes: sclera white, no icterus, conjunctiva pink, PERRLA and EOMs intact;  Neck:  Neck supple, trachea midline. No masses, lumps or thyromegaly present.  Cardiovascular: Normal rate and rhythm. S1,S2 noted.  No murmur, rubs or gallops noted. No JVD or BLE edema.  Pulmonary/Chest: Normal effort and positive  vesicular breath sounds. No respiratory distress. No wheezes, rales or ronchi noted.  Abdomen: Soft and nontender. Normal bowel sounds. No distention or masses noted. Liver, spleen and kidneys non palpable. Musculoskeletal: Strength 5/5 BUE/BLE. No difficulty with gait.  Neurological: Alert and oriented. Cranial nerves II-XII grossly intact. Coordination normal.  Psychiatric: Mood and affect normal. Behavior is normal. Judgment and thought content normal.    BMET    Component Value Date/Time   NA 136 08/30/2019 1455   NA 140 07/29/2019 1010   NA 139 01/29/2013 0040   K 4.9 08/30/2019 1455   K 4.0 01/29/2013 0040   CL 102 08/30/2019 1455   CL 107 01/29/2013 0040   CO2 25 08/30/2019 1455   CO2 28 01/29/2013 0040   GLUCOSE 320 (H) 08/30/2019 1455   GLUCOSE 228 (H) 01/29/2013 0040   BUN 9 08/30/2019 1455   BUN 8 07/29/2019 1010   BUN 15 01/29/2013 0040   CREATININE 0.99 08/30/2019 1455   CREATININE 0.88 01/29/2013 0040   CALCIUM 8.8 (L) 08/30/2019 1455   CALCIUM 9.2 01/29/2013 0040   GFRNONAA >60 08/30/2019 1455   GFRNONAA >60 01/29/2013 0040   GFRAA >60 08/30/2019 1455   GFRAA >60 01/29/2013 0040    Lipid Panel     Component Value Date/Time   CHOL 163 09/26/2018 1547   TRIG 176 (H) 09/26/2018 1547   HDL 39 (L) 09/26/2018 1547   CHOLHDL 4.2 09/26/2018 1547   CHOLHDL 6 06/20/2017 1510   VLDL 50.2 (H) 06/20/2017 1510   LDLCALC 89 09/26/2018 1547    CBC    Component Value Date/Time   WBC 17.7 (H) 07/29/2019 1010   WBC 14.0 (H) 07/24/2019 1200   RBC 3.87 (L) 07/29/2019 1010   RBC 3.36 (L) 07/24/2019 1200   HGB 12.7 (L) 07/29/2019 1010   HCT 37.2 (L) 07/29/2019 1010   PLT 432 07/29/2019 1010   MCV 96 07/29/2019 1010  MCV 93 01/29/2013 0040   MCH 32.8 07/29/2019 1010   MCH 32.7 07/24/2019 1200   MCHC 34.1 07/29/2019 1010   MCHC 34.5 07/24/2019 1200   RDW 13.1 07/29/2019 1010   RDW 13.8 01/29/2013 0040   LYMPHSABS 2.8 07/24/2019 1200   MONOABS 1.3 (H) 07/24/2019  1200   EOSABS 0.4 07/24/2019 1200   BASOSABS 0.1 07/24/2019 1200    Hgb A1C Lab Results  Component Value Date   HGBA1C 7.5 (A) 03/05/2019            Assessment & Plan:   Preventative Health Maintenance:  Flu shot today Tetanus UTD Pneumovax UTD Encouraged him to get a Covid vaccine Encouraged him to consume a balanced diet exercise regimen Advised him to see an eye doctor and dentist annually We will check CBC, C met, lipid and A1c today  RTC in 6 months, follow-up DM 2 Webb Silversmith, NP This visit occurred during the SARS-CoV-2 public health emergency.  Safety protocols were in place, including screening questions prior to the visit, additional usage of staff PPE, and extensive cleaning of exam room while observing appropriate contact time as indicated for disinfecting solutions.

## 2019-11-13 NOTE — Assessment & Plan Note (Signed)
A1c today No urine microalbumin secondary to ARB therapy Continue Lantus, Metformin and Januvia Reinforced low-carb diet and exercise for weight loss Foot exam today, referral to podiatry placed Encourage routine eye exam, will request copy Flu and Pneumovax UTD

## 2019-11-14 LAB — LIPID PANEL
Chol/HDL Ratio: 5.5 ratio — ABNORMAL HIGH (ref 0.0–5.0)
Cholesterol, Total: 133 mg/dL (ref 100–199)
HDL: 24 mg/dL — ABNORMAL LOW (ref >39)
LDL Chol Calc (NIH): 76 mg/dL (ref 0–99)
Triglycerides: 195 mg/dL — ABNORMAL HIGH (ref 0–149)
VLDL Cholesterol Cal: 33 mg/dL (ref 5–40)

## 2019-11-14 LAB — HEMOGLOBIN A1C
Est. average glucose Bld gHb Est-mCnc: 206 mg/dL
Hgb A1c MFr Bld: 8.8 % — ABNORMAL HIGH (ref 4.8–5.6)

## 2019-11-14 LAB — COMPREHENSIVE METABOLIC PANEL
ALT: 53 IU/L — ABNORMAL HIGH (ref 0–44)
AST: 23 IU/L (ref 0–40)
Albumin/Globulin Ratio: 1.6 (ref 1.2–2.2)
Albumin: 4.6 g/dL (ref 4.0–5.0)
Alkaline Phosphatase: 149 IU/L — ABNORMAL HIGH (ref 44–121)
BUN/Creatinine Ratio: 10 (ref 9–20)
BUN: 10 mg/dL (ref 6–20)
Bilirubin Total: 0.3 mg/dL (ref 0.0–1.2)
CO2: 24 mmol/L (ref 20–29)
Calcium: 9.4 mg/dL (ref 8.7–10.2)
Chloride: 101 mmol/L (ref 96–106)
Creatinine, Ser: 1.04 mg/dL (ref 0.76–1.27)
GFR calc Af Amer: 104 mL/min/{1.73_m2} (ref >59)
GFR calc non Af Amer: 90 mL/min/{1.73_m2} (ref >59)
Globulin, Total: 2.8 g/dL (ref 1.5–4.5)
Glucose: 191 mg/dL — ABNORMAL HIGH (ref 65–99)
Potassium: 4 mmol/L (ref 3.5–5.2)
Sodium: 140 mmol/L (ref 134–144)
Total Protein: 7.4 g/dL (ref 6.0–8.5)

## 2019-11-14 LAB — CBC
Hematocrit: 49.9 % (ref 37.5–51.0)
Hemoglobin: 16.9 g/dL (ref 13.0–17.7)
MCH: 31.6 pg (ref 26.6–33.0)
MCHC: 33.9 g/dL (ref 31.5–35.7)
MCV: 93 fL (ref 79–97)
Platelets: 271 10*3/uL (ref 150–450)
RBC: 5.35 x10E6/uL (ref 4.14–5.80)
RDW: 13.6 % (ref 11.6–15.4)
WBC: 11.2 10*3/uL — ABNORMAL HIGH (ref 3.4–10.8)

## 2019-11-18 ENCOUNTER — Encounter: Payer: Self-pay | Admitting: Internal Medicine

## 2019-11-20 ENCOUNTER — Ambulatory Visit: Payer: BC Managed Care – PPO | Admitting: Podiatry

## 2019-11-27 ENCOUNTER — Other Ambulatory Visit: Payer: Self-pay | Admitting: Internal Medicine

## 2019-11-27 DIAGNOSIS — E1140 Type 2 diabetes mellitus with diabetic neuropathy, unspecified: Secondary | ICD-10-CM

## 2019-11-27 DIAGNOSIS — IMO0002 Reserved for concepts with insufficient information to code with codable children: Secondary | ICD-10-CM

## 2019-11-29 MED ORDER — SITAGLIPTIN PHOSPHATE 100 MG PO TABS: ORAL_TABLET | ORAL | 0 refills | Status: DC

## 2019-11-29 MED ORDER — LANTUS SOLOSTAR 100 UNIT/ML ~~LOC~~ SOPN: PEN_INJECTOR | SUBCUTANEOUS | 1 refills | Status: DC

## 2019-11-29 MED ORDER — METFORMIN HCL 1000 MG PO TABS: 1000.0000 mg | ORAL_TABLET | Freq: Two times a day (BID) | ORAL | 0 refills | Status: DC

## 2019-12-02 ENCOUNTER — Other Ambulatory Visit: Payer: Self-pay | Admitting: Internal Medicine

## 2019-12-02 DIAGNOSIS — E1140 Type 2 diabetes mellitus with diabetic neuropathy, unspecified: Secondary | ICD-10-CM

## 2019-12-02 DIAGNOSIS — IMO0002 Reserved for concepts with insufficient information to code with codable children: Secondary | ICD-10-CM

## 2019-12-02 MED ORDER — LANTUS SOLOSTAR 100 UNIT/ML ~~LOC~~ SOPN: PEN_INJECTOR | SUBCUTANEOUS | 1 refills | Status: DC

## 2019-12-02 MED ORDER — METFORMIN HCL 1000 MG PO TABS: 1000.0000 mg | ORAL_TABLET | Freq: Two times a day (BID) | ORAL | 0 refills | Status: DC

## 2019-12-02 MED ORDER — SITAGLIPTIN PHOSPHATE 100 MG PO TABS: ORAL_TABLET | ORAL | 0 refills | Status: DC

## 2019-12-02 NOTE — Addendum Note (Signed)
Addended by: Roena Malady on: 12/02/2019 09:28 AM   Modules accepted: Orders

## 2019-12-04 ENCOUNTER — Encounter: Payer: Self-pay | Admitting: Podiatry

## 2019-12-04 ENCOUNTER — Ambulatory Visit: Payer: BC Managed Care – PPO | Admitting: Podiatry

## 2019-12-04 ENCOUNTER — Other Ambulatory Visit: Payer: Self-pay

## 2019-12-04 DIAGNOSIS — M2141 Flat foot [pes planus] (acquired), right foot: Secondary | ICD-10-CM | POA: Diagnosis not present

## 2019-12-04 DIAGNOSIS — E1165 Type 2 diabetes mellitus with hyperglycemia: Secondary | ICD-10-CM

## 2019-12-04 DIAGNOSIS — M21861 Other specified acquired deformities of right lower leg: Secondary | ICD-10-CM

## 2019-12-04 DIAGNOSIS — M216X9 Other acquired deformities of unspecified foot: Secondary | ICD-10-CM

## 2019-12-04 DIAGNOSIS — E1142 Type 2 diabetes mellitus with diabetic polyneuropathy: Secondary | ICD-10-CM | POA: Diagnosis not present

## 2019-12-04 DIAGNOSIS — Z8631 Personal history of diabetic foot ulcer: Secondary | ICD-10-CM | POA: Diagnosis not present

## 2019-12-04 DIAGNOSIS — L84 Corns and callosities: Secondary | ICD-10-CM | POA: Diagnosis not present

## 2019-12-04 DIAGNOSIS — M21862 Other specified acquired deformities of left lower leg: Secondary | ICD-10-CM

## 2019-12-04 DIAGNOSIS — M216X2 Other acquired deformities of left foot: Secondary | ICD-10-CM

## 2019-12-04 DIAGNOSIS — M216X1 Other acquired deformities of right foot: Secondary | ICD-10-CM

## 2019-12-04 DIAGNOSIS — M2142 Flat foot [pes planus] (acquired), left foot: Secondary | ICD-10-CM

## 2019-12-04 NOTE — Progress Notes (Signed)
  Subjective:  Patient ID: Martin Mullen, male    DOB: 07-04-1980,  MRN: [id number]  Chief Complaint  Patient presents with  . Callouses    DFC, he comes in for painful callouses bottoms of bilat feet and left hallux  . Diabetes    39 y.o. male presents with the above complaint. History confirmed with patient. He has had a history of diabetic foot ulcerations, he was treated by Dr Ether Griffins at [facility] for these. He thinks his A1c is about 9%. They healed and since that time he has had worsening callus formation, now the worst of which is the right foot which started a blister. He has a hx of CAD with STEMI at age 23. He is expecting twins. He works at [employer].  Objective:  Physical Exam: warm, good capillary refill, normal DP and PT pulses and absent protective sensation plantar feet and pulps of toes. Pre-ulcerative callus with underlying intact skin medial hallux, submet 5 left foot, the right foot has submet 2-3 and 5 pre ulcerative callus with underlying intact skin. Gastrocnemius equinus is present bilaterally  Assessment:   1. Uncontrolled type 2 diabetes mellitus with hyperglycemia (HCC)   2. Diabetic peripheral neuropathy (HCC)   3. History of diabetic ulcer of foot   4. Pes planus of both feet   5. Gastrocnemius equinus of right lower extremity   6. Gastrocnemius equinus of left lower extremity   7. Plantar flexed metatarsal, unspecified laterality      Plan:  Patient was evaluated and treated and all questions answered.  Patient educated on diabetes. Discussed proper diabetic foot care and discussed risks and complications of disease. Educated patient in depth on reasons to return to the office immediately should he/she discover anything concerning or new on the feet. All questions answered. Discussed proper shoes as well.   All symptomatic hyperkeratoses were safely debrided with a sterile #15 blade to patient's level of comfort without incident. We discussed  preventative and palliative care of these lesions including supportive and accommodative shoegear, padding, prefabricated and custom molded accommodative orthoses, use of a pumice stone and lotions/creams daily.  Recommend he use diabetic foot lotion and urea cream on the calluses.  Today I recommended we get him custom molded accommodative orthoses to offload the calluses and prevent ulceration. He wears medical grade NB shoes. I discussed his care with Ria Clock, CPED and he took a scan of the foot today.  No follow-ups on file.

## 2019-12-04 NOTE — Telephone Encounter (Signed)
I do not see this medication on his list... please advise if okay to refill

## 2019-12-04 NOTE — Patient Instructions (Addendum)
Look for urea 40% cream or ointment and apply to the thickened dry skin / calluses. This can be bought over the counter, at a pharmacy or online such as [employer].  Moisturize feet once daily; do not apply between toes: A.  Aquaphor Healing Ointment B.  Vaseline Intensive Care Lotion C.  Lubriderm Lotion D.  Gold Bond Diabetic Foot Lotion E.  Eucerin Intensive Repair Moisturizing Lotion  If you have problems reaching your feet:  A.  Aquaphor Advanced Therapy Ointment Body Spray B.  Vaseline Intensive Care Spray Lotion Advanced Repair     Diabetes Mellitus and Foot Care Foot care is an important part of your health, especially when you have diabetes. Diabetes may cause you to have problems because of poor blood flow (circulation) to your feet and legs, which can cause your skin to:  Become thinner and drier.  Break more easily.  Heal more slowly.  Peel and crack. You may also have nerve damage (neuropathy) in your legs and feet, causing decreased feeling in them. This means that you may not notice minor injuries to your feet that could lead to more serious problems. Noticing and addressing any potential problems early is the best way to prevent future foot problems. How to care for your feet Foot hygiene  Wash your feet daily with warm water and mild soap. Do not use hot water. Then, pat your feet and the areas between your toes until they are completely dry. Do not soak your feet as this can dry your skin.  Trim your toenails straight across. Do not dig under them or around the cuticle. File the edges of your nails with an emery board or nail file.  Apply a moisturizing lotion or petroleum jelly to the skin on your feet and to dry, brittle toenails. Use lotion that does not contain alcohol and is unscented. Do not apply lotion between your toes. Shoes and socks  Wear clean socks or stockings every day. Make sure they are not too tight. Do not wear knee-high stockings since they may  decrease blood flow to your legs.  Wear shoes that fit properly and have enough cushioning. Always look in your shoes before you put them on to be sure there are no objects inside.  To break in new shoes, wear them for just a few hours a day. This prevents injuries on your feet. Wounds, scrapes, corns, and calluses  Check your feet daily for blisters, cuts, bruises, sores, and redness. If you cannot see the bottom of your feet, use a mirror or ask someone for help.  Do not cut corns or calluses or try to remove them with medicine.  If you find a minor scrape, cut, or break in the skin on your feet, keep it and the skin around it clean and dry. You may clean these areas with mild soap and water. Do not clean the area with peroxide, alcohol, or iodine.  If you have a wound, scrape, corn, or callus on your foot, look at it several times a day to make sure it is healing and not infected. Check for: ? Redness, swelling, or pain. ? Fluid or blood. ? Warmth. ? Pus or a bad smell. General instructions  Do not cross your legs. This may decrease blood flow to your feet.  Do not use heating pads or hot water bottles on your feet. They may burn your skin. If you have lost feeling in your feet or legs, you may not know this is  happening until it is too late.  Protect your feet from hot and cold by wearing shoes, such as at the beach or on hot pavement.  Schedule a complete foot exam at least once a year (annually) or more often if you have foot problems. If you have foot problems, report any cuts, sores, or bruises to your health care provider immediately. Contact a health care provider if:  You have a medical condition that increases your risk of infection and you have any cuts, sores, or bruises on your feet.  You have an injury that is not healing.  You have redness on your legs or feet.  You feel burning or tingling in your legs or feet.  You have pain or cramps in your legs and  feet.  Your legs or feet are numb.  Your feet always feel cold.  You have pain around a toenail. Get help right away if:  You have a wound, scrape, corn, or callus on your foot and: ? You have pain, swelling, or redness that gets worse. ? You have fluid or blood coming from the wound, scrape, corn, or callus. ? Your wound, scrape, corn, or callus feels warm to the touch. ? You have pus or a bad smell coming from the wound, scrape, corn, or callus. ? You have a fever. ? You have a red line going up your leg. Summary  Check your feet every day for cuts, sores, red spots, swelling, and blisters.  Moisturize feet and legs daily.  Wear shoes that fit properly and have enough cushioning.  If you have foot problems, report any cuts, sores, or bruises to your health care provider immediately.  Schedule a complete foot exam at least once a year (annually) or more often if you have foot problems. This information is not intended to replace advice given to you by your health care provider. Make sure you discuss any questions you have with your health care provider. Document Revised: 10/24/2018 Document Reviewed: 03/04/2016 Elsevier Patient Education  2020 [employer].

## 2019-12-18 ENCOUNTER — Institutional Professional Consult (permissible substitution): Payer: BC Managed Care – PPO | Admitting: Plastic Surgery

## 2019-12-21 ENCOUNTER — Encounter: Payer: Self-pay | Admitting: Emergency Medicine

## 2019-12-21 ENCOUNTER — Observation Stay: Payer: BC Managed Care – PPO

## 2019-12-21 ENCOUNTER — Inpatient Hospital Stay
Admission: EM | Admit: 2019-12-21 | Discharge: 2019-12-23 | DRG: 301 | Discharge status: Against Medical Advice | Payer: BC Managed Care – PPO | Attending: Internal Medicine | Admitting: Internal Medicine

## 2019-12-21 ENCOUNTER — Emergency Department: Payer: BC Managed Care – PPO

## 2019-12-21 ENCOUNTER — Other Ambulatory Visit: Payer: Self-pay

## 2019-12-21 DIAGNOSIS — R200 Anesthesia of skin: Secondary | ICD-10-CM

## 2019-12-21 DIAGNOSIS — F17210 Nicotine dependence, cigarettes, uncomplicated: Secondary | ICD-10-CM | POA: Diagnosis present

## 2019-12-21 DIAGNOSIS — Z5329 Procedure and treatment not carried out because of patient's decision for other reasons: Secondary | ICD-10-CM | POA: Diagnosis not present

## 2019-12-21 DIAGNOSIS — E7800 Pure hypercholesterolemia, unspecified: Secondary | ICD-10-CM | POA: Diagnosis present

## 2019-12-21 DIAGNOSIS — I7389 Other specified peripheral vascular diseases: Principal | ICD-10-CM | POA: Diagnosis present

## 2019-12-21 DIAGNOSIS — Z7982 Long term (current) use of aspirin: Secondary | ICD-10-CM

## 2019-12-21 DIAGNOSIS — E1165 Type 2 diabetes mellitus with hyperglycemia: Secondary | ICD-10-CM | POA: Diagnosis not present

## 2019-12-21 DIAGNOSIS — Z820 Family history of epilepsy and other diseases of the nervous system: Secondary | ICD-10-CM | POA: Diagnosis not present

## 2019-12-21 DIAGNOSIS — Z8261 Family history of arthritis: Secondary | ICD-10-CM | POA: Diagnosis not present

## 2019-12-21 DIAGNOSIS — F1290 Cannabis use, unspecified, uncomplicated: Secondary | ICD-10-CM | POA: Diagnosis present

## 2019-12-21 DIAGNOSIS — I2510 Atherosclerotic heart disease of native coronary artery without angina pectoris: Secondary | ICD-10-CM | POA: Diagnosis present

## 2019-12-21 DIAGNOSIS — I639 Cerebral infarction, unspecified: Secondary | ICD-10-CM

## 2019-12-21 DIAGNOSIS — Z794 Long term (current) use of insulin: Secondary | ICD-10-CM

## 2019-12-21 DIAGNOSIS — I252 Old myocardial infarction: Secondary | ICD-10-CM

## 2019-12-21 DIAGNOSIS — Z955 Presence of coronary angioplasty implant and graft: Secondary | ICD-10-CM

## 2019-12-21 DIAGNOSIS — E785 Hyperlipidemia, unspecified: Secondary | ICD-10-CM | POA: Diagnosis present

## 2019-12-21 DIAGNOSIS — Z83438 Family history of other disorder of lipoprotein metabolism and other lipidemia: Secondary | ICD-10-CM | POA: Diagnosis not present

## 2019-12-21 DIAGNOSIS — Z833 Family history of diabetes mellitus: Secondary | ICD-10-CM | POA: Diagnosis not present

## 2019-12-21 DIAGNOSIS — K219 Gastro-esophageal reflux disease without esophagitis: Secondary | ICD-10-CM | POA: Diagnosis present

## 2019-12-21 DIAGNOSIS — I213 ST elevation (STEMI) myocardial infarction of unspecified site: Secondary | ICD-10-CM | POA: Diagnosis present

## 2019-12-21 DIAGNOSIS — E782 Mixed hyperlipidemia: Secondary | ICD-10-CM

## 2019-12-21 DIAGNOSIS — E1169 Type 2 diabetes mellitus with other specified complication: Secondary | ICD-10-CM | POA: Diagnosis present

## 2019-12-21 DIAGNOSIS — I10 Essential (primary) hypertension: Secondary | ICD-10-CM | POA: Diagnosis present

## 2019-12-21 DIAGNOSIS — Z79899 Other long term (current) drug therapy: Secondary | ICD-10-CM

## 2019-12-21 DIAGNOSIS — Z720 Tobacco use: Secondary | ICD-10-CM | POA: Diagnosis not present

## 2019-12-21 DIAGNOSIS — I6523 Occlusion and stenosis of bilateral carotid arteries: Secondary | ICD-10-CM | POA: Diagnosis not present

## 2019-12-21 DIAGNOSIS — R262 Difficulty in walking, not elsewhere classified: Secondary | ICD-10-CM | POA: Diagnosis present

## 2019-12-21 DIAGNOSIS — R202 Paresthesia of skin: Secondary | ICD-10-CM

## 2019-12-21 DIAGNOSIS — Z20822 Contact with and (suspected) exposure to covid-19: Secondary | ICD-10-CM | POA: Diagnosis not present

## 2019-12-21 LAB — CBC WITH DIFFERENTIAL/PLATELET
Abs Immature Granulocytes: 0.07 10*3/uL (ref 0.00–0.07)
Basophils Absolute: 0.1 10*3/uL (ref 0.0–0.1)
Basophils Relative: 1 %
Eosinophils Absolute: 0.5 10*3/uL (ref 0.0–0.5)
Eosinophils Relative: 3 %
HCT: 45.3 % (ref 39.0–52.0)
Hemoglobin: 16 g/dL (ref 13.0–17.0)
Immature Granulocytes: 0 %
Lymphocytes Relative: 33 %
Lymphs Abs: 5.1 10*3/uL — ABNORMAL HIGH (ref 0.7–4.0)
MCH: 32.1 pg (ref 26.0–34.0)
MCHC: 35.3 g/dL (ref 30.0–36.0)
MCV: 91 fL (ref 80.0–100.0)
Monocytes Absolute: 0.8 10*3/uL (ref 0.1–1.0)
Monocytes Relative: 5 %
Neutro Abs: 9 10*3/uL — ABNORMAL HIGH (ref 1.7–7.7)
Neutrophils Relative %: 58 %
Platelets: 258 10*3/uL (ref 150–400)
RBC: 4.98 MIL/uL (ref 4.22–5.81)
RDW: 13 % (ref 11.5–15.5)
WBC: 15.6 10*3/uL — ABNORMAL HIGH (ref 4.0–10.5)
nRBC: 0 % (ref 0.0–0.2)

## 2019-12-21 LAB — CK: Total CK: 125 U/L (ref 49–397)

## 2019-12-21 LAB — COMPREHENSIVE METABOLIC PANEL
ALT: 42 U/L (ref 0–44)
AST: 30 U/L (ref 15–41)
Albumin: 3.9 g/dL (ref 3.5–5.0)
Alkaline Phosphatase: 99 U/L (ref 38–126)
Anion gap: 13 (ref 5–15)
BUN: 12 mg/dL (ref 6–20)
CO2: 24 mmol/L (ref 22–32)
Calcium: 8.9 mg/dL (ref 8.9–10.3)
Chloride: 102 mmol/L (ref 98–111)
Creatinine, Ser: 1.03 mg/dL (ref 0.61–1.24)
GFR, Estimated: >60 mL/min (ref >60)
Glucose, Bld: 184 mg/dL — ABNORMAL HIGH (ref 70–99)
Potassium: 3.7 mmol/L (ref 3.5–5.1)
Sodium: 139 mmol/L (ref 135–145)
Total Bilirubin: 0.5 mg/dL (ref 0.3–1.2)
Total Protein: 7.1 g/dL (ref 6.5–8.1)

## 2019-12-21 LAB — RESPIRATORY PANEL BY RT PCR (FLU A&B, COVID)
Influenza A by PCR: NEGATIVE
Influenza B by PCR: NEGATIVE
SARS Coronavirus 2 by RT PCR: NEGATIVE

## 2019-12-21 LAB — CBG MONITORING, ED: Glucose-Capillary: 114 mg/dL — ABNORMAL HIGH (ref 70–99)

## 2019-12-21 LAB — TROPONIN I (HIGH SENSITIVITY)
Troponin I (High Sensitivity): 4 ng/L (ref <18)
Troponin I (High Sensitivity): 4 ng/L (ref <18)

## 2019-12-21 MED ORDER — ACETAMINOPHEN 160 MG/5ML PO SOLN
650.0000 mg | ORAL | Status: DC | PRN
  Filled 2019-12-21: qty 20.3

## 2019-12-21 MED ORDER — LINAGLIPTIN 5 MG PO TABS
5.0000 mg | ORAL_TABLET | Freq: Every day | ORAL | Status: DC
  Administered 2019-12-21 – 2019-12-22 (×2): 5 mg via ORAL
  Filled 2019-12-21 (×3): qty 1

## 2019-12-21 MED ORDER — ACETAMINOPHEN 650 MG RE SUPP: 650.0000 mg | RECTAL | Status: DC | PRN

## 2019-12-21 MED ORDER — ENOXAPARIN SODIUM 40 MG/0.4ML ~~LOC~~ SOLN: 40.0000 mg | SUBCUTANEOUS | Status: DC

## 2019-12-21 MED ORDER — STROKE: EARLY STAGES OF RECOVERY BOOK: Freq: Once | Status: DC

## 2019-12-21 MED ORDER — NICOTINE 14 MG/24HR TD PT24
14.0000 mg | MEDICATED_PATCH | Freq: Every day | TRANSDERMAL | Status: DC
  Administered 2019-12-21 – 2019-12-22 (×2): 14 mg via TRANSDERMAL
  Filled 2019-12-21 (×2): qty 1

## 2019-12-21 MED ORDER — ATORVASTATIN CALCIUM 20 MG PO TABS
80.0000 mg | ORAL_TABLET | Freq: Every day | ORAL | Status: DC
  Administered 2019-12-21 – 2019-12-22 (×2): 80 mg via ORAL
  Filled 2019-12-21: qty 4

## 2019-12-21 MED ORDER — ACETAMINOPHEN 325 MG PO TABS: 650.0000 mg | ORAL_TABLET | ORAL | Status: DC | PRN

## 2019-12-21 MED ORDER — SENNOSIDES-DOCUSATE SODIUM 8.6-50 MG PO TABS: 1.0000 | ORAL_TABLET | Freq: Every evening | ORAL | Status: DC | PRN

## 2019-12-21 MED ORDER — SODIUM CHLORIDE 0.9 % IV SOLN: INTRAVENOUS | Status: DC

## 2019-12-21 MED ORDER — INSULIN GLARGINE 100 UNIT/ML ~~LOC~~ SOLN
23.0000 [IU] | Freq: Every day | SUBCUTANEOUS | Status: DC
  Administered 2019-12-21 – 2019-12-22 (×2): 23 [IU] via SUBCUTANEOUS
  Filled 2019-12-21 (×2): qty 0.23

## 2019-12-21 MED ORDER — ENOXAPARIN SODIUM 60 MG/0.6ML ~~LOC~~ SOLN
0.5000 mg/kg | SUBCUTANEOUS | Status: DC
  Filled 2019-12-21: qty 0.6

## 2019-12-21 NOTE — ED Triage Notes (Signed)
Pt presents to ED via POV with c/o numbness and SOB. Pt states initial numbness started Wednesday to L leg with subjective balance issues. Pt states worsening today that he noticed at 1400 when he awoke, states did not notice worsening and new numbness to L arm when he went to sleep at approx 11am today.   Facial symmetry intact, grip strength equal bilaterally, no speech deficits noted. Pt ambulatory without difficulty. Pt able to speak in full and complete sentences without difficulty.   Pt also c/o some mild SOB at this time, able to speak in full and complete sentences without difficulty.

## 2019-12-21 NOTE — Progress Notes (Signed)
PHARMACIST - PHYSICIAN COMMUNICATION  CONCERNING:  Enoxaparin (Lovenox) for DVT Prophylaxis    RECOMMENDATION: Patient was prescribed enoxaprin 40mg  q24 hours for VTE prophylaxis.   Filed Weights   12/21/19 1553  Weight: 114.3 kg (252 lb)    Body mass index is 36.16 kg/m.  Estimated Creatinine Clearance: 121.9 mL/min (by C-G formula based on SCr of 1.03 mg/dL).   Based on [facility] policy patient is candidate for enoxaparin 0.5mg /kg TBW SQ every 24 hours based on BMI being >30.  DESCRIPTION: Pharmacy has adjusted enoxaparin dose per [facility] policy.  Patient is now receiving enoxaparin 57.5 mg every 24 hours    [facility], PharmD Clinical Pharmacist  12/21/2019 8:00 PM

## 2019-12-21 NOTE — ED Provider Notes (Signed)
[facility] Emergency Department Provider Note   ____________________________________________   First MD Initiated Contact with Patient 12/21/19 1555     (approximate)  I have reviewed the triage vital signs and the nursing notes.   HISTORY  Chief Complaint Numbness and Shortness of Breath    HPI Martin Mullen is a 39 y.o. male with stated past medical history of coronary artery disease, type 2 diabetes, hypercholesterolemia, and tobacco abuse who presents for left-sided numbness.  Patient states that he has had left upper and lower extremity as well as left facial and tongue numbness intermittently over the past 2 weeks.  Patient states that symptoms began with numbness in his left arm and left face that have been intermittent over the last 2 weeks.  When patient went to sleep today around 11 AM he did have some numbness of the left arm and face when he woke up at 1400, he also noticed the left lower extremity numbness as well as balance issues that he states are from "not being able to feel my legs".  Patient has had difficulty with ambulation since that time.  Patient denies any slurred speech, blurry vision, double vision, tinnitus, headache         Past Medical History:  Diagnosis Date  . Acute transmural inferior wall MI ([place]) 08/28/2012   .5 x 12 mm Veri-flex stent non-DES.  Marland Kitchen Chicken pox   . Coronary artery disease    Inferior ST elevation myocardial infarction in July of 2014. Cardiac catheterization showed 95% distal RCA stenosis and 90% first diagonal stenosis with normal ejection fraction. He underwent PCI in bare-metal stent placement to the distal RCA.  . Diabetes mellitus without complication ([place])   . Hypercholesterolemia   . Tobacco use     Patient Active Problem List   Diagnosis Date Noted  . GERD (gastroesophageal reflux disease) 07/20/2019  . Tobacco abuse   . Hyperlipidemia associated with type 2 diabetes mellitus ([place])  11/29/2018  . Coronary artery disease   . Type 2 diabetes mellitus with hyperlipidemia ([place]) 07/09/2013  . STEMI s/p RCA stent 08/2012 09/05/2012  . HTN (hypertension) 09/05/2012  . HLD (hyperlipidemia) 09/05/2012  . Adjustment disorder with mixed anxiety and depressed mood 09/05/2012    Past Surgical History:  Procedure Laterality Date  . CORONARY ANGIOPLASTY WITH STENT PLACEMENT    . DEBRIDEMENT AND CLOSURE WOUND N/A 09/03/2019   Procedure: Debridement and closure of scrotal wound;  Surgeon: Cindra Presume, MD;  Location: [place];  Service: Plastics;  Laterality: N/A;  . DEBRIDEMENT AND CLOSURE WOUND N/A 07/25/2019   Procedure: DEBRIDEMENT AND CLOSURE WOUND;  Surgeon: Cindra Presume, MD;  Location: [facility];  Service: Plastics;  Laterality: N/A;  . INCISION AND DRAINAGE ABSCESS N/A 07/20/2019   Procedure: INCISION AND DRAINAGE ABSCESS;  Surgeon: Lucas Mallow, MD;  Location: [facility];  Service: Urology;  Laterality: N/A;  . LEFT HEART CATHETERIZATION WITH CORONARY ANGIOGRAM N/A 08/28/2012   Procedure: LEFT HEART CATHETERIZATION WITH CORONARY ANGIOGRAM;  Surgeon: Laverda Page, MD;  Location: [facility] CATH LAB;  Service: Cardiovascular;  Laterality: N/A;    Prior to Admission medications   Medication Sig Start Date End Date Taking? Authorizing Provider  aspirin 81 MG EC tablet Take 1 tablet (81 mg total) by mouth daily. 09/05/12   Rai, Vernelle Emerald, MD  atorvastatin (LIPITOR) 80 MG tablet Take 80 mg by mouth daily. 11/27/19   [provider]  Blood Glucose  Monitoring Suppl (BAYER CONTOUR NEXT MONITOR) w/Device KIT 1 Device 07/22/15   [name], NP  CONTOUR NEXT TEST test strip USE 1 STRIP TO CHECK GLUCOSE THREE TIMES DAILY AS NEEDED 09/05/18   [name], NP  insulin glargine (LANTUS SOLOSTAR) 100 UNIT/ML Solostar Pen INJECT 23 UNITS UNDER THE SKIN EVERY DAY 12/02/19   [name], NP  Insulin Pen Needle 31G X 8 MM MISC 1 each by Does not apply route  daily. 11/09/18   [name], NP  losartan (COZAAR) 50 MG tablet Take 1 tablet (50 mg total) by mouth daily. 06/11/19   [name], NP  metFORMIN (GLUCOPHAGE) 1000 MG tablet Take 1 tablet (1,000 mg total) by mouth 2 (two) times daily with a meal. 12/02/19   [name], [name], NP  nicotine (NICODERM CQ - DOSED IN MG/24 HOURS) 21 mg/24hr patch One 42m patch every day to chest wall (okay to substitute generic) 07/26/19   WLoletha Grayer MD  nitroGLYCERIN (NITROSTAT) 0.4 MG SL tablet DISSOLVE ONE TABLET UNDER THE TONGUE EVERY 5 MINUTES AS NEEDED FOR CHEST PAIN.  DO NOT EXCEED A TOTAL OF 3 DOSES IN 15 MINUTES 11/13/19   BJearld Fenton NP  omeprazole (PRILOSEC) 20 MG capsule Take 1 capsule (20 mg total) by mouth daily. 11/12/18   BJearld Fenton NP  sitaGLIPtin (JANUVIA) 100 MG tablet TAKE 1 TABLET(100 MG) BY MOUTH DAILY 12/02/19   BJearld Fenton NP    Allergies Patient has no known allergies.  Family History  Problem Relation Age of Onset  . Arthritis Mother        Rheumatoid  . Diabetes Mother   . Hyperlipidemia Mother   . Diabetes Father   . Cancer Maternal Grandfather        Prostate  . Parkinson's disease Maternal Grandfather     Social History Social History   Tobacco Use  . Smoking status: Current Every Day Smoker    Packs/day: 1.00    Years: 20.00    Pack years: 20.00    Types: Cigarettes  . Smokeless tobacco: Never Used  Substance Use Topics  . Alcohol use: Yes    Alcohol/week: 0.0 standard drinks    Comment: occasional  . Drug use: Yes    Types: Marijuana    Review of Systems Constitutional: No fever/chills Eyes: No visual changes. ENT: No sore throat. Cardiovascular: Denies chest pain. Respiratory: Denies shortness of breath. Gastrointestinal: No abdominal pain.  No nausea, no vomiting.  No diarrhea. Genitourinary: Negative for dysuria. Musculoskeletal: Negative for acute arthralgias Skin: Negative for rash. Neurological: Negative for headaches,  weakness/paresthesias in any extremity Psychiatric: Negative for suicidal ideation/homicidal ideation   ____________________________________________   PHYSICAL EXAM:  VITAL SIGNS: ED Triage Vitals  Enc Vitals Group     BP 12/21/19 1554 (!) 146/98     Pulse Rate 12/21/19 1554 (!) 107     Resp 12/21/19 1554 18     Temp 12/21/19 1554 98.4 F (36.9 C)     Temp Source 12/21/19 1554 Oral     SpO2 12/21/19 1554 94 %     Weight 12/21/19 1553 252 lb (114.3 kg)     Height 12/21/19 1553 5' 10"  (1.778 m)     Head Circumference --      Peak Flow --      Pain Score 12/21/19 1553 0     Pain Loc --      Pain Edu? --  Excl. in [place]? --    Constitutional: Alert and oriented. Well appearing and in no acute distress. Eyes: Conjunctivae are normal. PERRL. Head: Atraumatic. Nose: No congestion/rhinnorhea. Mouth/Throat: Mucous membranes are moist. Neck: No stridor Cardiovascular: Grossly normal heart sounds.  Good peripheral circulation. Respiratory: Normal respiratory effort.  No retractions. Gastrointestinal: Soft and nontender. No distention. Musculoskeletal: No obvious deformities Neurologic:  Normal speech and language.  Subjective numbness to left upper and lower extremities Skin:  Skin is warm and dry. No rash noted. Psychiatric: Mood and affect are normal. Speech and behavior are normal.  ____________________________________________   LABS (all labs ordered are listed, but only abnormal results are displayed)  Labs Reviewed  COMPREHENSIVE METABOLIC PANEL - Abnormal; Notable for the following components:      Result Value   Glucose, Bld 184 (*)    All other components within normal limits  CBC WITH DIFFERENTIAL/PLATELET - Abnormal; Notable for the following components:   WBC 15.6 (*)    Neutro Abs 9.0 (*)    Lymphs Abs 5.1 (*)    All other components within normal limits  RESPIRATORY PANEL BY RT PCR (FLU A&B, COVID)  CK  TROPONIN I (HIGH SENSITIVITY)  TROPONIN I (HIGH  SENSITIVITY)   ____________________________________________  EKG  ED ECG REPORT I, Naaman Plummer, the attending physician, personally viewed and interpreted this ECG.  Date: 12/21/2019 EKG Time: 1552 Rate: 108 Rhythm: Tachycardic sinus rhythm QRS Axis: normal Intervals: normal ST/T Wave abnormalities: normal Narrative Interpretation: no evidence of acute ischemia  ____________________________________________  RADIOLOGY  ED MD interpretation: CT of the head without contrast shows no evidence of acute intracranial process including no intracerebral hemorrhage or fractures  Official radiology report(s): CT Head Wo Contrast  Result Date: 12/21/2019 CLINICAL DATA:  Patient complaining of left-sided numbness. EXAM: CT HEAD WITHOUT CONTRAST TECHNIQUE: Contiguous axial images were obtained from the base of the skull through the vertex without intravenous contrast. COMPARISON:  CT head 12/27/2008 FINDINGS: Brain: No evidence of acute infarction, hemorrhage, hydrocephalus, extra-axial collection or mass lesion/mass effect. Vascular: No hyperdense vessel or unexpected calcification. Skull: Normal. Negative for fracture or focal lesion. Sinuses/Orbits: No acute finding. Other: None. IMPRESSION: No acute intracranial process. Electronically Signed   By: Audie Pinto M.D.   On: 12/21/2019 18:03    ____________________________________________   PROCEDURES  Procedure(s) performed (including Critical Care):  .1-3 Lead EKG Interpretation Performed by: Naaman Plummer, MD Authorized by: Naaman Plummer, MD     Interpretation: normal     ECG rate:  97   ECG rate assessment: normal     Rhythm: sinus rhythm     Ectopy: none     Conduction: normal   .Critical Care Performed by: Naaman Plummer, MD Authorized by: Naaman Plummer, MD   Critical care provider statement:    Critical care time (minutes):  37   Critical care time was exclusive of:  Separately billable procedures and  treating other patients   Critical care was necessary to treat or prevent imminent or life-threatening deterioration of the following conditions:  CNS failure or compromise   Critical care was time spent personally by me on the following activities:  Discussions with consultants, evaluation of patient's response to treatment, examination of patient, ordering and performing treatments and interventions, ordering and review of laboratory studies, ordering and review of radiographic studies, pulse oximetry, re-evaluation of patient's condition, obtaining history from patient or surrogate and review of old charts   I assumed direction of critical  care for this patient from another provider in my specialty: no       ____________________________________________   INITIAL IMPRESSION / ASSESSMENT AND PLAN / ED COURSE  As part of my medical decision making, I reviewed the following data within the [place] notes reviewed and incorporated, Labs reviewed, EKG interpreted, Old chart reviewed, Radiograph reviewed and Notes from prior ED visits reviewed and incorporated      Patient is a 39 year old male who presents with symptoms concerning for TIA/CVA PMH risk factors: Coronary artery disease status post MI Neurologic Deficits: Left upper and lower extremity numbness, left facial numbness, left tongue numbness Last known Well Time: 12/21/2019 1100 NIH Stroke Score: 2 Given History and Exam I have lower suspicion for infectious etiology, neurologic changes secondary to toxicologic ingestion, seizure, complex migraine. Presentation concerning for possible stroke requiring workup.  Workup: Labs: POC glucose, CBC, BMP, LFTs, Troponin, PT/INR, PTT, Type and Screen Other Diagnostics: ECG, CXR, non-contrast head CT followed by CTA brain and neck (to r/o large vessel occlusion amenable to thrombectomy) Interventions: Patient's not eligible for TPA due to time at symptom  onset  Consult: hospitalist Disposition: Admit      ____________________________________________   FINAL CLINICAL IMPRESSION(S) / ED DIAGNOSES  Final diagnoses:  None     ED Discharge Orders    None       Note:  This document was prepared using Dragon voice recognition software and may include unintentional dictation errors.   Naaman Plummer, MD 12/21/19 [phone]

## 2019-12-21 NOTE — ED Notes (Signed)
Pt back from CT

## 2019-12-21 NOTE — H&P (Addendum)
History and Physical   Martin Mullen OVF:[id number] DOB: Feb 24, 1980 DOA: 12/21/2019  PCP: [name], NP Outpatient Specialists: no specialist physician Patient coming from: home   I have personally briefly reviewed patient's old medical records in [place].  Chief Concern: numbness of the left lower and upper extremities  HPI: Martin Mullen is a 39 y.o. 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  Martin Mullen presents to the emergency department for chief concern of numbness in leg on Wednesday from ankle to knee, having difficulty with balance. Lasting since Wednesday. Today, numbness of the 3rd - 5th fingers of the left hand ascending to upper left arm. He further endorses his whole tongue being numb. He reports his tongue numbness is similar to sensation that if he were to like a bag with residual cocaine.  He adamantly states that he has not used cocaine for over a year.  Further ROS was positive for some nausea on Wednesday, and no vomiting Weakness generalized weakness. He denies recent viral infection or symptoms. He denies fever, chills, HA, cough, nausea, vomiting, shortness, chest pain, abdominal pain, vision changes,   Denies changes to diet, noxious ingestion,   Does smoke marijuana, however this is not a new batch of marijuana.   Social: lives at home with roommates, smokes 1 ppd, endorses marijuana use, denies IV drug use  ED Course: Discussed with ED provider. Provider requesting admission for TIA. I requested ED provider to consult neurology  Review of Systems: As per HPI otherwise 10 point review of systems negative.   Assessment/Plan  Principal Problem:   Numbness Active Problems:   STEMI s/p RCA stent 08/2012    HTN (hypertension)   HLD (hyperlipidemia)   Type 2 diabetes mellitus with hyperlipidemia (HCC)   Coronary artery disease   Tobacco abuse   GERD (gastroesophageal reflux disease)   Numbness suspect secondary to Metformin use - check vitamin D level, B12, methylmalonic acid, however CVA/TIA cannot be excluded -CT the head was negative for acute intracranial process -Neurology has been consulted -MRI of the brain without contrast showed acute infarct in the left thalamus. Small acute infarct right thalamus and right inferior frontal white matter. Chronic infarcts of the midbrain bilaterally left greater than right. No intracranial large vessel occlusion there is atherosclerotic disease in the superior cerebellar arterial bilaterally. -Echo ordered -Lipid panel -Hemoglobin A1c -Admit to observation with telemetry  Leukocytosis-etiology unclear at this time, suspect reactive to stroke versus idiopathic leukocytosis in setting of chronic tobacco use -CBC in the a.m. - Checking ESR and CRP, if positive consider CT of his hip to assess for return of fornier gangrene  Hyperlipidemia-atorvastatin 80 mg  Insulin-dependent diabetes type 2, glargine 20 units daily, Tradjenta 5 mg daily  Tobacco use-nicotine patch  Hypertension-holding home losartan to allow for permissive hypertension  DVT prophylaxis: Holding Code Status: Full code Diet: N.p.o. Family Communication: Discussed with partner/friend at bedside Disposition Plan: Pending clinical course Consults called: Neurology Admission status: Observation with telemetry  Past Medical History:  Diagnosis Date  . Acute transmural inferior wall MI ([place]) 08/28/2012   .5 x 12 mm Veri-flex stent non-DES.  Marland Kitchen Chicken pox   . Coronary artery disease    Inferior ST elevation myocardial infarction in July of 2014.  Cardiac catheterization showed 95% distal RCA stenosis and 90% first diagonal stenosis with normal ejection fraction. He underwent PCI in  bare-metal stent placement to the distal RCA.  . Diabetes mellitus without complication ([place])   . Hypercholesterolemia   . Tobacco use    Past Surgical History:  Procedure Laterality Date  . CORONARY ANGIOPLASTY WITH STENT PLACEMENT    . DEBRIDEMENT AND CLOSURE WOUND N/A 09/03/2019   Procedure: Debridement and closure of scrotal wound;  Surgeon: Cindra Presume, MD;  Location: [place];  Service: Plastics;  Laterality: N/A;  . DEBRIDEMENT AND CLOSURE WOUND N/A 07/25/2019   Procedure: DEBRIDEMENT AND CLOSURE WOUND;  Surgeon: Cindra Presume, MD;  Location: [facility];  Service: Plastics;  Laterality: N/A;  . INCISION AND DRAINAGE ABSCESS N/A 07/20/2019   Procedure: INCISION AND DRAINAGE ABSCESS;  Surgeon: Lucas Mallow, MD;  Location: [facility];  Service: Urology;  Laterality: N/A;  . LEFT HEART CATHETERIZATION WITH CORONARY ANGIOGRAM N/A 08/28/2012   Procedure: LEFT HEART CATHETERIZATION WITH CORONARY ANGIOGRAM;  Surgeon: Laverda Page, MD;  Location: [facility] CATH LAB;  Service: Cardiovascular;  Laterality: N/A;   Social History:  reports that he has been smoking cigarettes. He has a 20.00 pack-year smoking history. He has never used smokeless tobacco. He reports current alcohol use. He reports current drug use. Drug: Marijuana.  No Known Allergies Family History  Problem Relation Age of Onset  . Arthritis Mother        Rheumatoid  . Diabetes Mother   . Hyperlipidemia Mother   . Diabetes Father   . Cancer Maternal Grandfather        Prostate  . Parkinson's disease Maternal Grandfather    Family history: Family history reviewed and no family history of strokes.   Prior to Admission medications   Medication Sig Start Date End Date Taking? Authorizing Provider  aspirin 81 MG EC tablet Take 1 tablet (81 mg total) by mouth daily. 09/05/12   Rai, Vernelle Emerald, MD  atorvastatin (LIPITOR) 80 MG tablet Take 80 mg by mouth daily. 11/27/19   [provider]  Blood Glucose  Monitoring Suppl (BAYER CONTOUR NEXT MONITOR) w/Device KIT 1 Device 07/22/15   [name], NP  CONTOUR NEXT TEST test strip USE 1 STRIP TO CHECK GLUCOSE THREE TIMES DAILY AS NEEDED 09/05/18   [name], NP  insulin glargine (LANTUS SOLOSTAR) 100 UNIT/ML Solostar Pen INJECT 23 UNITS UNDER THE SKIN EVERY DAY 12/02/19   [name], NP  Insulin Pen Needle 31G X 8 MM MISC 1 each by Does not apply route daily. 11/09/18   [name], NP  losartan (COZAAR) 50 MG tablet Take 1 tablet (50 mg total) by mouth daily. 06/11/19   [name], NP  metFORMIN (GLUCOPHAGE) 1000 MG tablet Take 1 tablet (1,000 mg total) by mouth 2 (two) times daily with a meal. 12/02/19   [name], [name], NP  nicotine (NICODERM CQ - DOSED IN MG/24 HOURS) 21 mg/24hr patch One 64m patch every day to chest wall (okay to substitute generic) 07/26/19   WLoletha Grayer MD  nitroGLYCERIN (NITROSTAT) 0.4 MG SL tablet DISSOLVE ONE TABLET UNDER THE TONGUE EVERY 5 MINUTES AS NEEDED FOR CHEST PAIN.  DO NOT EXCEED A TOTAL OF 3 DOSES IN 15 MINUTES 11/13/19   BJearld Fenton NP  omeprazole (PRILOSEC) 20 MG capsule Take 1 capsule (20 mg total) by mouth daily. 11/12/18   BJearld Fenton NP  sitaGLIPtin (  JANUVIA) 100 MG tablet TAKE 1 TABLET(100 MG) BY MOUTH DAILY 12/02/19   [name], NP   Physical Exam: Vitals:   12/21/19 2000 12/21/19 2130 12/21/19 2200 12/21/19 2231  BP: (!) 152/96 (!) 151/78 (!) 154/90 (!) 150/94  Pulse: 94 96  88  Resp: (!) 24 (!) 22  20  Temp:    97.6 F (36.4 C)  TempSrc:    Oral  SpO2: 95% 97% 97% 97%  Weight:      Height:       Constitutional: NAD, calm, comfortable Eyes: PERRL, lids and conjunctivae normal ENMT: Mucous membranes are moist. Posterior pharynx clear of any exudate or lesions. Age-appropriate dentition. Hearing appropriate Neck: normal, supple, no masses, no thyromegaly Respiratory: clear to auscultation bilaterally, no wheezing, no crackles. Normal respiratory effort. No  accessory muscle use.  Cardiovascular: Regular rate and rhythm, no murmurs / rubs / gallops. No extremity edema. 2+ pedal pulses. No carotid bruits.  Abdomen: Obese abdomen, no tenderness, no masses palpated, no hepatosplenomegaly. Bowel sounds positive.  Musculoskeletal: no clubbing / cyanosis. No joint deformity upper and lower extremities. Good ROM, no contractures, no atrophy. Normal muscle tone.  Skin: no rashes, lesions, ulcers. No induration Neurologic: CN 2-12 grossly intact. Sensation intact. Strength 5/5 in all 4.  Psychiatric: Normal judgment and insight. Alert and oriented x 3. Normal mood.   EKG: Independently reviewed, showing sinus tachycardia with rate of 108, LVH, no ST elevation  MR Brain on Admission: Personally reviewed and I agree with radiologist reading as below.  CT Head Wo Contrast  Result Date: 12/21/2019 CLINICAL DATA:  Patient complaining of left-sided numbness. EXAM: CT HEAD WITHOUT CONTRAST TECHNIQUE: Contiguous axial images were obtained from the base of the skull through the vertex without intravenous contrast. COMPARISON:  CT head 12/27/2008 FINDINGS: Brain: No evidence of acute infarction, hemorrhage, hydrocephalus, extra-axial collection or mass lesion/mass effect. Vascular: No hyperdense vessel or unexpected calcification. Skull: Normal. Negative for fracture or focal lesion. Sinuses/Orbits: No acute finding. Other: None. IMPRESSION: No acute intracranial process. Electronically Signed   By: Audie Pinto M.D.   On: 12/21/2019 18:03   MR ANGIO HEAD WO CONTRAST  Result Date: 12/21/2019 CLINICAL DATA:  Left-sided numbness. EXAM: MRI HEAD WITHOUT CONTRAST MRA HEAD WITHOUT CONTRAST TECHNIQUE: Multiplanar, multiecho pulse sequences of the brain and surrounding structures were obtained without intravenous contrast. Angiographic images of the head were obtained using MRA technique without contrast. COMPARISON:  CT head 12/21/2019 FINDINGS: MRI HEAD FINDINGS Brain:  Acute infarct left thalamus measuring approximately 7 x 15 mm. Tiny acute infarct right thalamus. Probable acute infarct in the right inferior frontal white matter. 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 Ventricle size normal.  Negative for hemorrhage or mass. Vascular: Normal arterial flow voids Skull and upper cervical spine: No focal skeletal abnormality. Sinuses/Orbits: Mild mucosal edema paranasal sinuses. Negative orbit. Other: None MRA HEAD FINDINGS Left vertebral artery dominant and main supply the basilar. Left PICA patent. Hypoplastic right vertebral artery is small and ends in PICA. Basilar widely patent. Superior cerebellar arteries are patent with atherosclerotic irregularity and stenosis bilaterally. Posterior cerebral arteries patent bilaterally without significant stenosis. Internal carotid artery widely patent bilaterally with mild atherosclerotic irregularity in the cavernous portion bilaterally. Anterior and middle cerebral arteries patent bilaterally without stenosis or large vessel occlusion. Negative for cerebral aneurysm. IMPRESSION: 1. Acute infarct in the left thalamus. Small acute infarct right thalamus and right inferior frontal white matter. 2. Chronic infarcts in the midbrain  bilaterally left greater than right. 3. No intracranial large vessel occlusion. There is atherosclerotic disease in the superior cerebellar artery bilaterally. Electronically Signed   By: Franchot Gallo M.D.   On: 12/21/2019 21:06   MR Brain Wo Contrast (neuro protocol)  Result Date: 12/21/2019 CLINICAL DATA:  Left-sided numbness. EXAM: MRI HEAD WITHOUT CONTRAST MRA HEAD WITHOUT CONTRAST TECHNIQUE: Multiplanar, multiecho pulse sequences of the brain and surrounding structures were obtained without intravenous contrast. Angiographic images of the head were obtained using MRA technique without contrast. COMPARISON:  CT head 12/21/2019 FINDINGS: MRI HEAD FINDINGS Brain: Acute infarct  left thalamus measuring approximately 7 x 15 mm. Tiny acute infarct right thalamus. Probable acute infarct in the right inferior frontal white matter. 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 Ventricle size normal.  Negative for hemorrhage or mass. Vascular: Normal arterial flow voids Skull and upper cervical spine: No focal skeletal abnormality. Sinuses/Orbits: Mild mucosal edema paranasal sinuses. Negative orbit. Other: None MRA HEAD FINDINGS Left vertebral artery dominant and main supply the basilar. Left PICA patent. Hypoplastic right vertebral artery is small and ends in PICA. Basilar widely patent. Superior cerebellar arteries are patent with atherosclerotic irregularity and stenosis bilaterally. Posterior cerebral arteries patent bilaterally without significant stenosis. Internal carotid artery widely patent bilaterally with mild atherosclerotic irregularity in the cavernous portion bilaterally. Anterior and middle cerebral arteries patent bilaterally without stenosis or large vessel occlusion. Negative for cerebral aneurysm. IMPRESSION: 1. Acute infarct in the left thalamus. Small acute infarct right thalamus and right inferior frontal white matter. 2. 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 3. No intracranial large vessel occlusion. There is atherosclerotic disease in the superior cerebellar artery bilaterally. Electronically Signed   By: Franchot Gallo M.D.   On: 12/21/2019 21:06   Labs on Admission: I have personally reviewed following labs  CBC: Recent Labs  Lab 12/21/19 1606  WBC 15.6*  NEUTROABS 9.0*  HGB 16.0  HCT 45.3  MCV 91.0  PLT 115   Basic Metabolic Panel: Recent Labs  Lab 12/21/19 1606  NA 139  K 3.7  CL 102  CO2 24  GLUCOSE 184*  BUN 12  CREATININE 1.03  CALCIUM 8.9   GFR: Estimated Creatinine Clearance: 121.9 mL/min (by C-G formula based on SCr of 1.03 mg/dL). Liver Function Tests: Recent Labs  Lab  12/21/19 1606  AST 30  ALT 42  ALKPHOS 99  BILITOT 0.5  PROT 7.1  ALBUMIN 3.9   Cardiac Enzymes: Recent Labs  Lab 12/21/19 1606  CKTOTAL 125   CBG: Recent Labs  Lab 12/21/19 2107  GLUCAP 114*   Urine analysis:    Component Value Date/Time   COLORURINE YELLOW (A) 07/20/2019 1059   APPEARANCEUR CLEAR (A) 07/20/2019 1059   LABSPEC 1.017 07/20/2019 1059   PHURINE 6.0 07/20/2019 1059   GLUCOSEU >=500 (A) 07/20/2019 1059   HGBUR SMALL (A) 07/20/2019 1059   BILIRUBINUR NEGATIVE 07/20/2019 1059   KETONESUR 20 (A) 07/20/2019 1059   PROTEINUR NEGATIVE 07/20/2019 1059   NITRITE NEGATIVE 07/20/2019 1059   LEUKOCYTESUR NEGATIVE 07/20/2019 1059    Germaine Shenker N Tonimarie Gritz D.O. [facility]  If 12AM-7AM, please contact overnight-coverage provider If 7AM-7PM, please contact day coverage provider [email or web address]  12/22/2019, 12:29 AM

## 2019-12-21 NOTE — ED Notes (Signed)
Pt transported to MRI 

## 2019-12-22 ENCOUNTER — Encounter: Payer: Self-pay | Admitting: Internal Medicine

## 2019-12-22 ENCOUNTER — Inpatient Hospital Stay: Admit: 2019-12-22 | Payer: BC Managed Care – PPO

## 2019-12-22 ENCOUNTER — Inpatient Hospital Stay: Payer: BC Managed Care – PPO

## 2019-12-22 DIAGNOSIS — E1165 Type 2 diabetes mellitus with hyperglycemia: Secondary | ICD-10-CM | POA: Diagnosis present

## 2019-12-22 DIAGNOSIS — I7389 Other specified peripheral vascular diseases: Secondary | ICD-10-CM | POA: Diagnosis present

## 2019-12-22 DIAGNOSIS — Z833 Family history of diabetes mellitus: Secondary | ICD-10-CM | POA: Diagnosis not present

## 2019-12-22 DIAGNOSIS — K219 Gastro-esophageal reflux disease without esophagitis: Secondary | ICD-10-CM | POA: Diagnosis present

## 2019-12-22 DIAGNOSIS — R200 Anesthesia of skin: Secondary | ICD-10-CM

## 2019-12-22 DIAGNOSIS — I639 Cerebral infarction, unspecified: Secondary | ICD-10-CM

## 2019-12-22 DIAGNOSIS — F1290 Cannabis use, unspecified, uncomplicated: Secondary | ICD-10-CM | POA: Diagnosis present

## 2019-12-22 DIAGNOSIS — R262 Difficulty in walking, not elsewhere classified: Secondary | ICD-10-CM | POA: Diagnosis present

## 2019-12-22 DIAGNOSIS — Z5329 Procedure and treatment not carried out because of patient's decision for other reasons: Secondary | ICD-10-CM | POA: Diagnosis not present

## 2019-12-22 DIAGNOSIS — Z820 Family history of epilepsy and other diseases of the nervous system: Secondary | ICD-10-CM | POA: Diagnosis not present

## 2019-12-22 DIAGNOSIS — Z83438 Family history of other disorder of lipoprotein metabolism and other lipidemia: Secondary | ICD-10-CM | POA: Diagnosis not present

## 2019-12-22 DIAGNOSIS — Z8261 Family history of arthritis: Secondary | ICD-10-CM | POA: Diagnosis not present

## 2019-12-22 DIAGNOSIS — I2510 Atherosclerotic heart disease of native coronary artery without angina pectoris: Secondary | ICD-10-CM | POA: Diagnosis present

## 2019-12-22 DIAGNOSIS — Z20822 Contact with and (suspected) exposure to covid-19: Secondary | ICD-10-CM | POA: Diagnosis present

## 2019-12-22 DIAGNOSIS — Z720 Tobacco use: Secondary | ICD-10-CM | POA: Diagnosis not present

## 2019-12-22 DIAGNOSIS — I10 Essential (primary) hypertension: Secondary | ICD-10-CM | POA: Diagnosis present

## 2019-12-22 DIAGNOSIS — Z955 Presence of coronary angioplasty implant and graft: Secondary | ICD-10-CM | POA: Diagnosis not present

## 2019-12-22 DIAGNOSIS — Z794 Long term (current) use of insulin: Secondary | ICD-10-CM | POA: Diagnosis not present

## 2019-12-22 DIAGNOSIS — Z79899 Other long term (current) drug therapy: Secondary | ICD-10-CM | POA: Diagnosis not present

## 2019-12-22 DIAGNOSIS — E7800 Pure hypercholesterolemia, unspecified: Secondary | ICD-10-CM | POA: Diagnosis present

## 2019-12-22 DIAGNOSIS — E1169 Type 2 diabetes mellitus with other specified complication: Secondary | ICD-10-CM | POA: Diagnosis not present

## 2019-12-22 DIAGNOSIS — I252 Old myocardial infarction: Secondary | ICD-10-CM | POA: Diagnosis not present

## 2019-12-22 DIAGNOSIS — Z7982 Long term (current) use of aspirin: Secondary | ICD-10-CM | POA: Diagnosis not present

## 2019-12-22 DIAGNOSIS — F17210 Nicotine dependence, cigarettes, uncomplicated: Secondary | ICD-10-CM | POA: Diagnosis present

## 2019-12-22 LAB — GLUCOSE, CAPILLARY: Glucose-Capillary: 175 mg/dL — ABNORMAL HIGH (ref 70–99)

## 2019-12-22 LAB — URINE DRUG SCREEN, QUALITATIVE (ARMC ONLY)
Amphetamines, Ur Screen: NOT DETECTED
Barbiturates, Ur Screen: NOT DETECTED
Benzodiazepine, Ur Scrn: NOT DETECTED
Cannabinoid 50 Ng, Ur ~~LOC~~: POSITIVE — AB
Cocaine Metabolite,Ur ~~LOC~~: NOT DETECTED
MDMA (Ecstasy)Ur Screen: NOT DETECTED
Methadone Scn, Ur: NOT DETECTED
Opiate, Ur Screen: NOT DETECTED
Phencyclidine (PCP) Ur S: NOT DETECTED
Tricyclic, Ur Screen: NOT DETECTED

## 2019-12-22 LAB — LIPID PANEL
Cholesterol: 84 mg/dL (ref 0–200)
HDL: 20 mg/dL — ABNORMAL LOW (ref >40)
LDL Cholesterol: 47 mg/dL (ref 0–99)
Total CHOL/HDL Ratio: 4.2 RATIO
Triglycerides: 83 mg/dL (ref <150)
VLDL: 17 mg/dL (ref 0–40)

## 2019-12-22 LAB — VITAMIN B12: Vitamin B-12: 203 pg/mL (ref 180–914)

## 2019-12-22 LAB — HEMOGLOBIN A1C
Hgb A1c MFr Bld: 8.3 % — ABNORMAL HIGH (ref 4.8–5.6)
Mean Plasma Glucose: 191.51 mg/dL

## 2019-12-22 LAB — VITAMIN D 25 HYDROXY (VIT D DEFICIENCY, FRACTURES): Vit D, 25-Hydroxy: 30.76 ng/mL (ref 30–100)

## 2019-12-22 LAB — C-REACTIVE PROTEIN: CRP: 0.6 mg/dL (ref <1.0)

## 2019-12-22 LAB — SEDIMENTATION RATE: Sed Rate: 9 mm/hr (ref 0–15)

## 2019-12-22 MED ORDER — ONDANSETRON HCL 4 MG PO TABS: 4.0000 mg | ORAL_TABLET | Freq: Three times a day (TID) | ORAL | Status: DC | PRN

## 2019-12-22 MED ORDER — ASPIRIN 325 MG PO TABS
325.0000 mg | ORAL_TABLET | Freq: Every day | ORAL | Status: DC
  Administered 2019-12-22: 325 mg via ORAL
  Filled 2019-12-22 (×2): qty 1

## 2019-12-22 MED ORDER — MORPHINE SULFATE (PF) 2 MG/ML IV SOLN
0.5000 mg | Freq: Four times a day (QID) | INTRAVENOUS | Status: DC | PRN
Start: 2019-12-22 — End: 2019-12-22

## 2019-12-22 MED ORDER — VITAMIN D 25 MCG (1000 UNIT) PO TABS
1000.0000 [IU] | ORAL_TABLET | Freq: Every day | ORAL | Status: DC
  Administered 2019-12-22 (×2): 1000 [IU] via ORAL

## 2019-12-22 MED ORDER — CLOPIDOGREL BISULFATE 75 MG PO TABS
75.0000 mg | ORAL_TABLET | Freq: Every day | ORAL | Status: DC
  Administered 2019-12-22: 75 mg via ORAL
  Filled 2019-12-22: qty 1

## 2019-12-22 MED ORDER — IOHEXOL 350 MG/ML SOLN
75.0000 mL | Freq: Once | INTRAVENOUS | Status: AC | PRN
  Administered 2019-12-22: 75 mL via INTRAVENOUS

## 2019-12-22 NOTE — Progress Notes (Signed)
OT Cancellation Note  Patient Details Name: Martin Mullen MRN: [id number] DOB: Oct 11, 1980   Cancelled Treatment:    Reason Eval/Treat Not Completed: Active bedrest order. OT order received and chart reviewed. Pt noted to have bed rest orders, currently pending neuro consult. Will hold and initiate services as pt appropriate for activity.   Kathie Dike, M.S. OTR/L  12/22/19, 8:26 AM  ascom [phone]

## 2019-12-22 NOTE — Evaluation (Signed)
Physical Therapy Evaluation Patient Details Name: Martin Mullen MRN: [id number] DOB: 1980-06-15 Today's Date: 12/22/2019   History of Present Illness  Pt is a 39 y/o M admitted on 12/21/19 with L sided facial & tongue numbness intermittently over the past 2 weeks with 1 day of balance issues. MRI revealed B thalamic infarcts & small area of restricted diffusion in the R frontal lobe, chronic infarcts bilaterally in midbrain, & atherosclerotic disease in B superior cerebellar arteries. PMH: DM2, CAD, hypercholesterolemia, tobacco abuse, hx of cocaine use, THC use, STEMI s/p RCA stent 08/2012  Clinical Impression  Pt agreeable to tx. Pt Independent & working PTA, expecting twins next week. Pt endorses ongoing impaired sensation in LUE/LLE & face/tongue but proprioception currently intact. Pt requires CGA<>min assist for gait without AD & demonstrates decreased weight shift L & decreased balance with LLE weight bearing during stance phase of gait. At this time, recommending OPPT services for high level balance training & NMR to allow pt to return to high level of function. Pt would benefit from formal high level balance testing.   PT educates pt on increased risk of stroke especially in setting of using alcohol, tobacco & other drugs.     Follow Up Recommendations Outpatient PT;Supervision for mobility/OOB    Equipment Recommendations  None recommended by PT    Recommendations for Other Services       Precautions / Restrictions Precautions Precautions: Fall Restrictions Weight Bearing Restrictions: No      Mobility  Bed Mobility Overal bed mobility: Independent                  Transfers Overall transfer level: Independent                  Ambulation/Gait Ambulation/Gait assistance: Min assist;Min guard Gait Distance (Feet): 140 ft (IV pole) + 400 Feet (no AD) Assistive device: None   Gait velocity: WNL   General Gait Details: decreased weight shift to L in  stance phase, decreased balance in LLE stance phase, L knee rigidity during gait  Stairs            Wheelchair Mobility    Modified Rankin (Stroke Patients Only)       Balance Overall balance assessment: Mild deficits observed, not formally tested;Needs assistance   Sitting balance-Leahy Scale: Normal       Standing balance-Leahy Scale: Good Standing balance comment: CGA<>min assist gait without AD               High Level Balance Comments: would benefit from high level balance test as able             Pertinent Vitals/Pain Pain Assessment: No/denies pain    Home Living Family/patient expects to be discharged to:: Private residence Living Arrangements: Spouse/significant other Available Help at Discharge: Family Type of Home: Mobile home Home Access: Stairs to enter Entrance Stairs-Rails: Right;Left;Can reach both Entrance Stairs-Number of Steps: 5 Home Layout: One level        Prior Function Level of Independence: Independent         Comments: working at [employer], wife expecting twins next week     Hand Dominance   Dominant Hand: Right    Extremity/Trunk Assessment   Upper Extremity Assessment Upper Extremity Assessment: Overall WFL for tasks assessed (finger to nose intact & equal BUE, pt endorses LUE numbness but not formally tested)    Lower Extremity Assessment Lower Extremity Assessment: Overall WFL for tasks assessed (L ankle dorsiflexion 4+/5,  otherwise BLE 5/5 MMT, heel to shin intact & equal BLE, decreased sensation lateral aspect of distal LLE & foot compared to RLE (tested via light touch), BLE proprioception intact)    Cervical / Trunk Assessment Cervical / Trunk Assessment: Normal  Communication   Communication: No difficulties  Cognition Arousal/Alertness: Awake/alert Behavior During Therapy: WFL for tasks assessed/performed Overall Cognitive Status: Within Functional Limits for tasks assessed                                         General Comments      Exercises     Assessment/Plan    PT Assessment Patient needs continued PT services  PT Problem List Decreased balance;Impaired sensation       PT Treatment Interventions Balance training;Neuromuscular re-education;Patient/family education;Functional mobility training;Stair training;Gait training    PT Goals (Current goals can be found in the Care Plan section)  Acute Rehab PT Goals Patient Stated Goal: "to eat" PT Goal Formulation: With patient Time For Goal Achievement: 01/05/20 Potential to Achieve Goals: Good    Frequency 7X/week   Barriers to discharge        Co-evaluation               AM-PAC PT "6 Clicks" Mobility  Outcome Measure Help needed turning from your back to your side while in a flat bed without using bedrails?: None Help needed moving from lying on your back to sitting on the side of a flat bed without using bedrails?: None Help needed moving to and from a bed to a chair (including a wheelchair)?: None Help needed standing up from a chair using your arms (e.g., wheelchair or bedside chair)?: None Help needed to walk in hospital room?: A Little Help needed climbing 3-5 steps with a railing? : A Little 6 Click Score: 22    End of Session Equipment Utilized During Treatment: Gait belt Activity Tolerance: Patient tolerated treatment well Patient left: in bed;with bed alarm set;with call bell/phone within reach Nurse Communication: Mobility status PT Visit Diagnosis: Other abnormalities of gait and mobility (R26.89);Hemiplegia and hemiparesis Hemiplegia - Right/Left: Left Hemiplegia - dominant/non-dominant: Non-dominant Hemiplegia - caused by: Cerebral infarction    Time: 1218-1239 PT Time Calculation (min) (ACUTE ONLY): 21 min   Charges:   PT Evaluation $PT Eval Low Complexity: 1 Low PT Treatments $Therapeutic Activity: 8-22 mins        Aleda Grana, PT, DPT 12/22/19, 1:26  PM   Sandi Mariscal 12/22/2019, 1:24 PM

## 2019-12-22 NOTE — Consult Note (Addendum)
Neurology Consultation  Reason for Consult: stroke Referring Physician: Dr. Allena Katz  CC: Left arm numbness, left leg numbness, difficulty walking  History is obtained from: Patient, chart  HPI: Martin Mullen is a 39 y.o. male who has an extensive cardiac history with MI at the age of 65, diabetes, coronary artery disease, hypercholesterolemia, tobacco abuse, presented to the emergency room with last known normal on Wednesday, 12/18/2019 when he noted sudden onset of left-sided numbness including left face, tongue, arm and leg. He then started noticing some difficulty walking-is felt he was imbalanced while walking. Symptoms did not resolve and he came to the hospital on the insistence of his wife. He was evaluated in the ER and due to ongoing symptoms, admitted for stroke work-up. MRI done revealed bilateral thalamic infarcts as well as a small area of restricted diffusion in the right frontal lobe. He denies having had strokelike symptoms in the past. He does acknowledge using tobacco but does not do excessive alcohol. He uses THC but no other illicit substances. He has a family history of diabetes on both sides of the family. He said he was diagnosed with high blood pressure but was taken off of his antihypertensive medications as an outpatient.    LKW: Sometime on Wednesday, 12/18/2019 tpa given?: no, outside the window Premorbid modified Rankin scale (mRS): 0  ROS: Performed and negative except as noted in HPI  Past Medical History:  Diagnosis Date  . 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  Marland Kitchen Chicken pox   . Coronary artery disease    Inferior ST elevation myocardial infarction in July of 2014. Cardiac catheterization showed 95% distal RCA stenosis and 90% first diagonal stenosis with normal ejection fraction. He underwent PCI in bare-metal stent placement to the distal RCA.  . Diabetes mellitus without complication (HCC)   .  Hypercholesterolemia   . Tobacco use    Family History  Problem Relation Age of Onset  . Arthritis Mother        Rheumatoid  . Diabetes Mother   . Hyperlipidemia Mother   . Diabetes Father   . Cancer Maternal Grandfather        Prostate  . Parkinson's disease Maternal Grandfather    Social History:   reports that he has been smoking cigarettes. He has a 20.00 pack-year smoking history. He has never used smokeless tobacco. He reports current alcohol use. He reports current drug use. Drug: Marijuana.  Medications  Current Facility-Administered Medications:  .   stroke: mapping our early stages of recovery book, , Does not apply, Once, Cox, Amy N, DO .  0.9 %  sodium chloride infusion, , Intravenous, Continuous, Cox, Amy N, DO, Last Rate: 125 mL/hr at 12/22/19 0431, New Bag at 12/22/19 0431 .  acetaminophen (TYLENOL) tablet 650 mg, 650 mg, Oral, Q4H PRN **OR** acetaminophen (TYLENOL) 160 MG/5ML solution 650 mg, 650 mg, Per Tube, Q4H PRN **OR** acetaminophen (TYLENOL) suppository 650 mg, 650 mg, Rectal, Q4H PRN, Cox, Amy N, DO .  atorvastatin (LIPITOR) tablet 80 mg, 80 mg, Oral, Daily, Cox, Amy N, DO, 80 mg at 12/21/19 2102 .  insulin glargine (LANTUS) injection 23 Units, 23 Units, Subcutaneous, Q2200, Cox, Amy N, DO, 23 Units at 12/21/19 2108 .  linagliptin (TRADJENTA) tablet 5 mg, 5 mg, Oral, Daily, Cox, Amy N, DO, 5 mg at 12/21/19 2103 .  nicotine (NICODERM CQ - dosed in mg/24 hours) patch 14 mg, 14 mg, Transdermal, Daily,  Cox, Amy N, DO, 14 mg at 12/21/19 2103 .  ondansetron (ZOFRAN) tablet 4 mg, 4 mg, Oral, Q8H PRN, Cox, Amy N, DO .  senna-docusate (Senokot-S) tablet 1 tablet, 1 tablet, Oral, QHS PRN, Cox, Amy N, DO   Exam: Current vital signs: BP 139/80 (BP Location: Left Arm)   Pulse 78   Temp 97.7 F (36.5 C) (Oral)   Resp 17   Ht 5\' 10"  (1.778 m)   Wt 114.3 kg   SpO2 98%   BMI 36.16 kg/m  Vital signs in last 24 hours: Temp:  [97.5 F (36.4 C)-98.4 F (36.9 C)] 97.7  F (36.5 C) (11/07 0817) Pulse Rate:  [78-107] 78 (11/07 0817) Resp:  [16-46] 17 (11/07 0817) BP: (115-154)/(78-99) 139/80 (11/07 0817) SpO2:  [94 %-99 %] 98 % (11/07 0817) Weight:  [114.3 kg] 114.3 kg (11/06 1553) GENERAL: Awake, alert in NAD HEENT: - Normocephalic and atraumatic, dry mm, no LN++, no Thyromegally LUNGS - Clear to auscultation bilaterally with no wheezes CV - S1S2 RRR, no m/r/g, equal pulses bilaterally. ABDOMEN - Soft, nontender, nondistended with normoactive BS Ext: warm, well perfused, intact peripheral pulses, no edema  NEURO:  Mental Status: AA&Ox3  Language: speech is nondysarthric.  Naming, repetition, fluency, and comprehension intact. Cranial Nerves: PERRL. EOMI, visual fields full, no facial asymmetry, facial sensation diminished on the left to light touch, hearing intact, tongue/uvula/soft palate midline, normal sternocleidomastoid and trapezius muscle strength. No evidence of tongue atrophy or fibrillations Motor: Antigravity 5/5 in all fours. Tone: is normal and bulk is normal Sensation-diminished on the left face and arm in comparison to the right. Nearly symmetric sensation in the lower extremities to light touch Coordination: FTN intact bilaterally, mildly dysmetric with heel-knee-shin testing in the left lower extremity Gait- deferred DTRs 2+ at biceps, 2+ at knees and 1+ at ankles.  NIHSS-2  Labs I have reviewed labs in epic and the results pertinent to this consultation are:  CBC    Component Value Date/Time   WBC 15.6 (H) 12/21/2019 1606   RBC 4.98 12/21/2019 1606   HGB 16.0 12/21/2019 1606   HGB 16.9 11/13/2019 1550   HCT 45.3 12/21/2019 1606   HCT 49.9 11/13/2019 1550   PLT 258 12/21/2019 1606   PLT 271 11/13/2019 1550   MCV 91.0 12/21/2019 1606   MCV 93 11/13/2019 1550   MCV 93 01/29/2013 0040   MCH 32.1 12/21/2019 1606   MCHC 35.3 12/21/2019 1606   RDW 13.0 12/21/2019 1606   RDW 13.6 11/13/2019 1550   RDW 13.8 01/29/2013 0040    LYMPHSABS 5.1 (H) 12/21/2019 1606   MONOABS 0.8 12/21/2019 1606   EOSABS 0.5 12/21/2019 1606   BASOSABS 0.1 12/21/2019 1606    CMP     Component Value Date/Time   NA 139 12/21/2019 1606   NA 140 11/13/2019 1550   NA 139 01/29/2013 0040   K 3.7 12/21/2019 1606   K 4.0 01/29/2013 0040   CL 102 12/21/2019 1606   CL 107 01/29/2013 0040   CO2 24 12/21/2019 1606   CO2 28 01/29/2013 0040   GLUCOSE 184 (H) 12/21/2019 1606   GLUCOSE 228 (H) 01/29/2013 0040   BUN 12 12/21/2019 1606   BUN 10 11/13/2019 1550   BUN 15 01/29/2013 0040   CREATININE 1.03 12/21/2019 1606   CREATININE 0.88 01/29/2013 0040   CALCIUM 8.9 12/21/2019 1606   CALCIUM 9.2 01/29/2013 0040   PROT 7.1 12/21/2019 1606   PROT 7.4 11/13/2019 1550  ALBUMIN 3.9 12/21/2019 1606   ALBUMIN 4.6 11/13/2019 1550   AST 30 12/21/2019 1606   ALT 42 12/21/2019 1606   ALKPHOS 99 12/21/2019 1606   BILITOT 0.5 12/21/2019 1606   BILITOT 0.3 11/13/2019 1550   GFRNONAA >60 12/21/2019 1606   GFRNONAA >60 01/29/2013 0040   GFRAA 104 11/13/2019 1550   GFRAA >60 01/29/2013 0040    Lipid Panel     Component Value Date/Time   CHOL 84 12/22/2019 0552   CHOL 133 11/13/2019 1550   TRIG 83 12/22/2019 0552   HDL 20 (L) 12/22/2019 0552   HDL 24 (L) 11/13/2019 1550   CHOLHDL 4.2 12/22/2019 0552   VLDL 17 12/22/2019 0552   LDLCALC 47 12/22/2019 0552   LDLCALC 76 11/13/2019 1550   LDLDIRECT 126.0 06/20/2017 1510   A1c 8.8 LDL 47. Previous LDL was 76.  Imaging I have reviewed the images obtained: MRI of the brain with a subtle right thalamic acute infarct, more prominent left thalamic acute infarct and a subtle inferior right frontal white matter infarct. 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 Atherosclerotic disease on the MRA in the bilateral superior cerebellar arteries.  Assessment: 39 year old man with diabetes, hypertension, hypercholesterolemia, coronary artery disease and history of MI at 39  years of age, presented with left-sided numbness and difficulty walking that has been ongoing since the past 4 days. He was outside the window for IV TPA. Exam is not consistent with LVO. Not in any sort of intervention window for a code stroke activation. His symptoms are likely related to the strokes that have been seen on the MRI-right thalamic, left thalamic (symptomatic) as well as subtle inferior right frontal white matter infarct. Although he has strokes in anterior and posterior circulation, I think they are likely concomitant small vessel disease but consideration has to be given to this being an embolic phenomenon because it involves anterior circulation as evidenced by the frontal stroke and bilateral thalami which could be due to embolization of artery of [place]. I will look at his blood vessels of head and neck more carefully with a CTA of the head and neck.  Impression: Acute ischemic stroke-likely concomitant small vessel disease. Less likely embolic source but will need further work-up. Evaluate for hypercoagulable state-also less likely given risk factors but given his young age it is prudent to work it up. CAD Hyperlipidemia next hypertension  Recommendations: -Frequent neurochecks -Telemetry -CTA head and neck -2D echocardiogram -Although my suspicion for an embolic source is less likely, I will wait for the 2D echocardiogram report to come in before deciding if he needs a TEE. -I will send a hypercoagulable panel -For stroke prevention, given the MRA of the head showing intracranial atherosclerosis (bilateral superior cerebellar arteries), I would treat him with maximal medical management-dual antiplatelets for 3 months-aspirin 325 and Plavix 75 followed by aspirin 325 only. -I would continue high intensity statin-atorvastatin 80 for now -From a blood pressure standpoint: His blood pressures need to be normalized now. His symptoms are at least 75 days old. No need for  permissive hypertension at this time. Goal blood pressure on discharge should be 140/90. -He would require close follow-up with primary care provider for management of his risk factors-diabetes, high blood pressure and hyperlipidemia. -PT -OT -Stroke swallow screen - can get a diet if passes screen. If fails, get a formal Swallow eval. -Counseled extensively on the importance of smoking cessation as well as abstinence from marijuana use.  I have  discussed my plan in detail with the patient and his wife at bedside.  Relayed my recommendations to Dr. Allena Katz.  Neurology will follow with you. -- Milon Dikes, MD [facility] Pager: [phone] If 7pm to 7am, please call on call as listed on AMION.

## 2019-12-22 NOTE — Progress Notes (Signed)
[facility]  - [facility]   PATIENT NAME: Martin Mullen    MR#:  [id number]  DATE OF BIRTH:  November 29, 1980  SUBJECTIVE:   Patient was very upset about my entering the room since he had not eaten anything since yesterday. I was notified by RN few minutes ago regarding patient's swallow eval test. Order diet.  REVIEW OF SYSTEMS:   Review of Systems  Constitutional: Negative for chills, fever and weight loss.  HENT: Negative for ear discharge, ear pain and nosebleeds.   Eyes: Negative for blurred vision, pain and discharge.  Respiratory: Negative for sputum production, shortness of breath, wheezing and stridor.   Cardiovascular: Negative for chest pain, palpitations, orthopnea and PND.  Gastrointestinal: Negative for abdominal pain, diarrhea, nausea and vomiting.  Genitourinary: Negative for frequency and urgency.  Musculoskeletal: Negative for back pain and joint pain.  Neurological: Positive for tingling and weakness. Negative for sensory change, speech change and focal weakness.  Psychiatric/Behavioral: Negative for depression and hallucinations. The patient is not nervous/anxious.    Tolerating Diet:yes Tolerating PT: out pt PT  DRUG ALLERGIES:  No Known Allergies  VITALS:  Blood pressure (!) 150/99, pulse 83, temperature 97.8 F (36.6 C), temperature source Oral, resp. rate 15, height 5\' 10"  (1.778 m), weight 114.3 kg, SpO2 98 %.  PHYSICAL EXAMINATION:   Physical Exam  GENERAL:  39 y.o.-year-old patient lying in the bed with no acute distress. obese HEENT: Head atraumatic, normocephalic. Oropharynx and nasopharynx clear.  NECK:  Supple, no jugular venous distention. No thyroid enlargement, no tenderness.  LUNGS: Normal breath sounds bilaterally, no wheezing, rales, rhonchi. No use of accessory muscles of respiration.  CARDIOVASCULAR: S1, S2 normal. No murmurs, rubs, or gallops.  ABDOMEN: Soft, nontender, nondistended. Bowel sounds present. No  organomegaly or mass.  EXTREMITIES: No cyanosis, clubbing or edema b/l.    NEUROLOGIC: speech clear. Cranial nerves grossly intact. Strength five over five all extremities. Reflexes 2+ both upper and lower extremity PSYCHIATRIC:  patient is alert and oriented x 3.  SKIN: No obvious rash, lesion, or ulcer.   LABORATORY PANEL:  CBC Recent Labs  Lab 12/21/19 1606  WBC 15.6*  HGB 16.0  HCT 45.3  PLT 258    Chemistries  Recent Labs  Lab 12/21/19 1606  NA 139  K 3.7  CL 102  CO2 24  GLUCOSE 184*  BUN 12  CREATININE 1.03  CALCIUM 8.9  AST 30  ALT 42  ALKPHOS 99  BILITOT 0.5   Cardiac Enzymes No results for input(s): TROPONINI in the last 168 hours. RADIOLOGY:  CT ANGIO HEAD W OR WO CONTRAST  Result Date: 12/22/2019 CLINICAL DATA:  Neuro deficit, acute, stroke suspected. EXAM: CT ANGIOGRAPHY HEAD AND NECK TECHNIQUE: Multidetector CT imaging of the head and neck was performed using the standard protocol during bolus administration of intravenous contrast. Multiplanar CT image reconstructions and MIPs were obtained to evaluate the vascular anatomy. Carotid stenosis measurements (when applicable) are obtained utilizing NASCET criteria, using the distal internal carotid diameter as the denominator. CONTRAST:  51mL OMNIPAQUE IOHEXOL 350 MG/ML SOLN COMPARISON:  Noncontrast head CT 12/21/2019. MRI/MRA head 12/21/2019. FINDINGS: CT HEAD FINDINGS Brain: Cerebral volume is normal. Redemonstrated small acute/early subacute infarcts within the bilateral thalamocapsular junctions and inferior right frontal lobe white matter. No interval infarct is appreciable by CT. There is no acute intracranial hemorrhage. No extra-axial fluid collection. No evidence of intracranial mass. No midline shift. Vascular: No hyperdense vessel. Skull: Normal. Negative for fracture or  focal lesion. Sinuses: Mild paranasal sinus mucosal thickening, most notably frontal and ethmoidal. Orbits: No mass or acute finding.  Review of the MIP images confirms the above findings CTA NECK FINDINGS Aortic arch: Standard aortic branching. Mild soft and calcified plaque within the proximal right subclavian artery right No hemodynamically significant innominate or proximal subclavian artery stenosis. Right carotid system: CCA and ICA patent within the neck without stenosis. No significant atherosclerotic disease. Left carotid system: CCA and ICA patent within the neck without stenosis. Mild soft and calcified plaque within the ICA bulb. Vertebral arteries: The right vertebral artery is developmentally diminutive, but patent throughout the neck. The dominant left vertebral artery is patent throughout the neck without stenosis. Skeleton: No acute bony abnormality or aggressive osseous lesion. Other neck: No neck mass or cervical lymphadenopathy. Thyroid unremarkable. Upper chest: No consolidation within the imaged lung apices. Incidentally noted azygos fissure. Review of the MIP images confirms the above findings CTA HEAD FINDINGS Anterior circulation: The intracranial internal carotid arteries are patent. Soft and calcified plaque within both vessels. No more than mild stenosis on the right. There is up to moderate stenosis within the distal cavernous/paraclinoid left ICA. The M1 middle cerebral arteries are patent without significant stenosis. No M2 proximal branch occlusion or high-grade proximal stenosis is identified. The anterior cerebral arteries are patent. 1-2 mm inferiorly projecting vascular protrusion arising from the paraclinoid right ICA which may reflect an infundibulum or small aneurysm (series 13, image 117). Posterior circulation: The non dominant intracranial right vertebral artery is developmentally diminutive beyond the origin of the right PICA, but patent. The dominant intracranial left vertebral artery is patent without stenosis. The basilar artery is patent and contains mild scattered soft plaque. The posterior cerebral  arteries are patent. Atherosclerotic irregularity of the distal right PCA. Posterior communicating arteries are hypoplastic or absent bilaterally. Venous sinuses: Within limitations of contrast timing, no convincing thrombus. Anatomic variants: As described. Review of the MIP images confirms the above findings IMPRESSION: CT head: Redemonstrated small acute/early subacute infarcts within the bilateral thalamocapsular junctions and inferior right frontal white matter. CTA neck: The common carotid, internal carotid and vertebral arteries are patent within the neck without hemodynamically significant stenosis. Mild soft and calcified plaque within the proximal right subclavian artery and left ICA bulb. CT head: 1. No intracranial large vessel occlusion or proximal high-grade arterial stenosis. 2. Atherosclerotic plaque within both intracranial internal carotid arteries. Up to moderate stenosis within the distal cavernous/paraclinoid left ICA. No more than mild stenosis of the intracranial right ICA. 3. Atherosclerotic irregularity of the distal right PCA. 4. 1-2 mm vascular protrusion arising from the paraclinoid right ICA, which may reflect an infundibulum or small aneurysm. Electronically Signed   By: Jackey Loge DO   On: 12/22/2019 12:41   CT Head Wo Contrast  Result Date: 12/21/2019 CLINICAL DATA:  Patient complaining of left-sided numbness. EXAM: CT HEAD WITHOUT CONTRAST TECHNIQUE: Contiguous axial images were obtained from the base of the skull through the vertex without intravenous contrast. COMPARISON:  CT head 12/27/2008 FINDINGS: Brain: No evidence of acute infarction, hemorrhage, hydrocephalus, extra-axial collection or mass lesion/mass effect. Vascular: No hyperdense vessel or unexpected calcification. Skull: Normal. Negative for fracture or focal lesion. Sinuses/Orbits: No acute finding. Other: None. IMPRESSION: No acute intracranial process. Electronically Signed   By: Emmaline Kluver M.D.   On:  12/21/2019 18:03   CT ANGIO NECK W OR WO CONTRAST  Result Date: 12/22/2019 CLINICAL DATA:  Neuro deficit, acute, stroke suspected. EXAM: CT  ANGIOGRAPHY HEAD AND NECK TECHNIQUE: Multidetector CT imaging of the head and neck was performed using the standard protocol during bolus administration of intravenous contrast. Multiplanar CT image reconstructions and MIPs were obtained to evaluate the vascular anatomy. Carotid stenosis measurements (when applicable) are obtained utilizing NASCET criteria, using the distal internal carotid diameter as the denominator. CONTRAST:  10mL OMNIPAQUE IOHEXOL 350 MG/ML SOLN COMPARISON:  Noncontrast head CT 12/21/2019. MRI/MRA head 12/21/2019. FINDINGS: CT HEAD FINDINGS Brain: Cerebral volume is normal. Redemonstrated small acute/early subacute infarcts within the bilateral thalamocapsular junctions and inferior right frontal lobe white matter. No interval infarct is appreciable by CT. There is no acute intracranial hemorrhage. No extra-axial fluid collection. No evidence of intracranial mass. No midline shift. Vascular: No hyperdense vessel. Skull: Normal. Negative for fracture or focal lesion. Sinuses: Mild paranasal sinus mucosal thickening, most notably frontal and ethmoidal. Orbits: No mass or acute finding. Review of the MIP images confirms the above findings CTA NECK FINDINGS Aortic arch: Standard aortic branching. Mild soft and calcified plaque within the proximal right subclavian artery right No hemodynamically significant innominate or proximal subclavian artery stenosis. Right carotid system: CCA and ICA patent within the neck without stenosis. No significant atherosclerotic disease. Left carotid system: CCA and ICA patent within the neck without stenosis. Mild soft and calcified plaque within the ICA bulb. Vertebral arteries: The right vertebral artery is developmentally diminutive, but patent throughout the neck. The dominant left vertebral artery is patent throughout  the neck without stenosis. Skeleton: No acute bony abnormality or aggressive osseous lesion. Other neck: No neck mass or cervical lymphadenopathy. Thyroid unremarkable. Upper chest: No consolidation within the imaged lung apices. Incidentally noted azygos fissure. Review of the MIP images confirms the above findings CTA HEAD FINDINGS Anterior circulation: The intracranial internal carotid arteries are patent. Soft and calcified plaque within both vessels. No more than mild stenosis on the right. There is up to moderate stenosis within the distal cavernous/paraclinoid left ICA. The M1 middle cerebral arteries are patent without significant stenosis. No M2 proximal branch occlusion or high-grade proximal stenosis is identified. The anterior cerebral arteries are patent. 1-2 mm inferiorly projecting vascular protrusion arising from the paraclinoid right ICA which may reflect an infundibulum or small aneurysm (series 13, image 117). Posterior circulation: The non dominant intracranial right vertebral artery is developmentally diminutive beyond the origin of the right PICA, but patent. The dominant intracranial left vertebral artery is patent without stenosis. The basilar artery is patent and contains mild scattered soft plaque. The posterior cerebral arteries are patent. Atherosclerotic irregularity of the distal right PCA. Posterior communicating arteries are hypoplastic or absent bilaterally. Venous sinuses: Within limitations of contrast timing, no convincing thrombus. Anatomic variants: As described. Review of the MIP images confirms the above findings IMPRESSION: CT head: Redemonstrated small acute/early subacute infarcts within the bilateral thalamocapsular junctions and inferior right frontal white matter. CTA neck: The common carotid, internal carotid and vertebral arteries are patent within the neck without hemodynamically significant stenosis. Mild soft and calcified plaque within the proximal right subclavian  artery and left ICA bulb. CT head: 1. No intracranial large vessel occlusion or proximal high-grade arterial stenosis. 2. Atherosclerotic plaque within both intracranial internal carotid arteries. Up to moderate stenosis within the distal cavernous/paraclinoid left ICA. No more than mild stenosis of the intracranial right ICA. 3. Atherosclerotic irregularity of the distal right PCA. 4. 1-2 mm vascular protrusion arising from the paraclinoid right ICA, which may reflect an infundibulum or small aneurysm. Electronically Signed  By: Jackey Loge DO   On: 12/22/2019 12:41   MR ANGIO HEAD WO CONTRAST  Result Date: 12/21/2019 CLINICAL DATA:  Left-sided numbness. EXAM: MRI HEAD WITHOUT CONTRAST MRA HEAD WITHOUT CONTRAST TECHNIQUE: Multiplanar, multiecho pulse sequences of the brain and surrounding structures were obtained without intravenous contrast. Angiographic images of the head were obtained using MRA technique without contrast. COMPARISON:  CT head 12/21/2019 FINDINGS: MRI HEAD FINDINGS Brain: Acute infarct left thalamus measuring approximately 7 x 15 mm. Tiny acute infarct right thalamus. Probable acute infarct in the right inferior frontal white matter. 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 Ventricle size normal.  Negative for hemorrhage or mass. Vascular: Normal arterial flow voids Skull and upper cervical spine: No focal skeletal abnormality. Sinuses/Orbits: Mild mucosal edema paranasal sinuses. Negative orbit. Other: None MRA HEAD FINDINGS Left vertebral artery dominant and main supply the basilar. Left PICA patent. Hypoplastic right vertebral artery is small and ends in PICA. Basilar widely patent. Superior cerebellar arteries are patent with atherosclerotic irregularity and stenosis bilaterally. Posterior cerebral arteries patent bilaterally without significant stenosis. Internal carotid artery widely patent bilaterally with mild atherosclerotic irregularity in the  cavernous portion bilaterally. Anterior and middle cerebral arteries patent bilaterally without stenosis or large vessel occlusion. Negative for cerebral aneurysm. IMPRESSION: 1. Acute infarct in the left thalamus. Small acute infarct right thalamus and right inferior frontal white matter. 2. 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 3. No intracranial large vessel occlusion. There is atherosclerotic disease in the superior cerebellar artery bilaterally. Electronically Signed   By: Marlan Palau M.D.   On: 12/21/2019 21:06   MR Brain Wo Contrast (neuro protocol)  Result Date: 12/21/2019 CLINICAL DATA:  Left-sided numbness. EXAM: MRI HEAD WITHOUT CONTRAST MRA HEAD WITHOUT CONTRAST TECHNIQUE: Multiplanar, multiecho pulse sequences of the brain and surrounding structures were obtained without intravenous contrast. Angiographic images of the head were obtained using MRA technique without contrast. COMPARISON:  CT head 12/21/2019 FINDINGS: MRI HEAD FINDINGS Brain: Acute infarct left thalamus measuring approximately 7 x 15 mm. Tiny acute infarct right thalamus. Probable acute infarct in the right inferior frontal white matter. 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 Ventricle size normal.  Negative for hemorrhage or mass. Vascular: Normal arterial flow voids Skull and upper cervical spine: No focal skeletal abnormality. Sinuses/Orbits: Mild mucosal edema paranasal sinuses. Negative orbit. Other: None MRA HEAD FINDINGS Left vertebral artery dominant and main supply the basilar. Left PICA patent. Hypoplastic right vertebral artery is small and ends in PICA. Basilar widely patent. Superior cerebellar arteries are patent with atherosclerotic irregularity and stenosis bilaterally. Posterior cerebral arteries patent bilaterally without significant stenosis. Internal carotid artery widely patent bilaterally with mild atherosclerotic irregularity in the cavernous  portion bilaterally. Anterior and middle cerebral arteries patent bilaterally without stenosis or large vessel occlusion. Negative for cerebral aneurysm. IMPRESSION: 1. Acute infarct in the left thalamus. Small acute infarct right thalamus and right inferior frontal white matter. 2. 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 3. No intracranial large vessel occlusion. There is atherosclerotic disease in the superior cerebellar artery bilaterally. Electronically Signed   By: Marlan Palau M.D.   On: 12/21/2019 21:06   ASSESSMENT AND PLAN:   Martin Mullen is a 39 y.o. 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 -Appreciate neurology evaluation. Continue dual antiplatelet for three months aspirin 325 and Plavix 75 followed by aspirin 325 only -high intensity atorvastatin 80 mg daily -monitor blood pressure closely. -PT-- outpatient PT recommended -passed swallow eval -CTA head and neck no acute blockage -echo of the heart pending -hypercoaguable workup sent by neurology -lipid profile within normal limits except HDL is 20  Urine drug screen positive for cannabinoid  Type II diabetes insulin requiring, uncontrolled, hyperglycemia A1c 8.3 continue Lantus and sliding scale -will resume other home meds at discharge  Tobacco abuse counsel for cessation and offered nicotine patch  Hypertension on losartan at home  Procedures: none Family communication : patient will update family Consults : neurology CODE STATUS: full DVT Prophylaxis :  Lovenox  Status is: Inpatient  Remains inpatient appropriate because:Ongoing diagnostic testing needed not appropriate for outpatient work up   Dispo: The patient is from: Home              Anticipated d/c is to: Home              Anticipated d/c date is: 1 day              Patient currently is not medically stable to d/c.        TOTAL TIME TAKING CARE OF THIS PATIENT: 25 minutes.  >50% time spent on counselling and coordination of care  Note: This dictation was prepared with Dragon dictation along with smaller phrase technology. Any transcriptional errors that result from this process are unintentional.  Enedina Finner M.D    [facility]   CC: Primary care physician; [name], NPPatient ID: Martin Mullen, male   DOB: 1980/08/13, 39 y.o.   MRN: [id number]

## 2019-12-23 NOTE — Progress Notes (Signed)
Cross cover Just informed patient signed out AMA due to emergency with wife

## 2019-12-23 NOTE — Progress Notes (Signed)
Pt approached Clinical research associate stating his significant other was bleeding and felt that she was about to go into labor and that he was going with her to ED. Charge nurse notified. Pt notified that he would have to sign out AMA to leave floor. Pt educated about risks of leaving AMA and voiced "I am going down to the ER". AMA paperwork signed. NP notified. PIV removed prior to pt leaving floor.

## 2019-12-24 LAB — BETA-2-GLYCOPROTEIN I ABS, IGG/M/A
Beta-2 Glyco I IgG: <9 GPI IgG units (ref 0–20)
Beta-2-Glycoprotein I IgA: <9 GPI IgA units (ref 0–25)
Beta-2-Glycoprotein I IgM: <9 GPI IgM units (ref 0–32)

## 2019-12-24 LAB — LUPUS ANTICOAGULANT PANEL
DRVVT: 36.3 s (ref 0.0–47.0)
PTT Lupus Anticoagulant: 35.8 s (ref 0.0–51.9)

## 2019-12-24 LAB — PROTEIN S, TOTAL: Protein S Ag, Total: 64 % (ref 60–150)

## 2019-12-24 LAB — PROTEIN C ACTIVITY: Protein C Activity: 108 % (ref 73–180)

## 2019-12-24 LAB — HOMOCYSTEINE: Homocysteine: 8 umol/L (ref 0.0–14.5)

## 2019-12-24 LAB — CARDIOLIPIN ANTIBODIES, IGG, IGM, IGA
Anticardiolipin IgA: <9 APL U/mL (ref 0–11)
Anticardiolipin IgG: <9 GPL U/mL (ref 0–14)
Anticardiolipin IgM: 14 MPL U/mL — ABNORMAL HIGH (ref 0–12)

## 2019-12-24 LAB — PROTEIN S ACTIVITY: Protein S Activity: 66 % (ref 63–140)

## 2019-12-25 ENCOUNTER — Telehealth: Payer: Self-pay | Admitting: Internal Medicine

## 2019-12-25 LAB — PROTEIN C, TOTAL: Protein C, Total: 109 % (ref 60–150)

## 2019-12-25 NOTE — Telephone Encounter (Signed)
Pt called in wanted to call him about medical advice.

## 2019-12-26 LAB — PROTHROMBIN GENE MUTATION

## 2019-12-26 LAB — FACTOR 5 LEIDEN

## 2019-12-27 LAB — METHYLMALONIC ACID, SERUM: Methylmalonic Acid, Quantitative: 88 nmol/L (ref 0–378)

## 2020-01-01 ENCOUNTER — Encounter: Payer: Self-pay | Admitting: Internal Medicine

## 2020-01-01 ENCOUNTER — Ambulatory Visit: Payer: BC Managed Care – PPO | Admitting: Internal Medicine

## 2020-01-01 ENCOUNTER — Other Ambulatory Visit: Payer: Self-pay

## 2020-01-01 VITALS — BP 128/84 | HR 75 | Temp 98.1°F | Wt 257.0 lb

## 2020-01-01 DIAGNOSIS — R42 Dizziness and giddiness: Secondary | ICD-10-CM | POA: Diagnosis not present

## 2020-01-01 DIAGNOSIS — I639 Cerebral infarction, unspecified: Secondary | ICD-10-CM | POA: Diagnosis not present

## 2020-01-01 DIAGNOSIS — E785 Hyperlipidemia, unspecified: Secondary | ICD-10-CM

## 2020-01-01 DIAGNOSIS — I2510 Atherosclerotic heart disease of native coronary artery without angina pectoris: Secondary | ICD-10-CM

## 2020-01-01 DIAGNOSIS — R200 Anesthesia of skin: Secondary | ICD-10-CM

## 2020-01-01 DIAGNOSIS — E1169 Type 2 diabetes mellitus with other specified complication: Secondary | ICD-10-CM

## 2020-01-01 DIAGNOSIS — I6381 Other cerebral infarction due to occlusion or stenosis of small artery: Secondary | ICD-10-CM

## 2020-01-01 DIAGNOSIS — I10 Essential (primary) hypertension: Secondary | ICD-10-CM

## 2020-01-01 NOTE — Progress Notes (Signed)
Subjective:    Patient ID: Martin Mullen, male    DOB: Sep 28, 1980, 39 y.o.   MRN: [id number]  HPI  Patient presents the clinic today for hospital follow-up.  He presented to the ER 11/6 with complaint of numbness in his left upper and lower extremity, difficulty with balance and numbness of his tongue.  CT head was negative.  MRI brain showed acute infarct of the left thalamus, chronic infarcts of the left midbrain.  Neurology was consulted.  CTA of the head and neck was ordered.  Echocardiogram was ordered but this was not done prior to him leaving AMA.  He reports he left AMA secondary to his significant other being pregnant with his twins and having vaginal bleeding.  Since he left the hospital, he reports feeling off balanced and has persistent numbness/weakness of the LLE.  His LDL was 47, A1c 8.6%.  His BP today is 128/84.  He is currently taking Atorvastatin, Aspirin, Lantus (not regularly), Metformin, Januvia and Losartan. He is not currently checking his sugars. He does not follow with endocrinology or neurology.  Review of Systems      Past Medical History:  Diagnosis Date  . Acute transmural inferior wall MI ([place]) 08/28/2012   .5 x 12 mm Veri-flex stent non-DES.  Marland Kitchen Chicken pox   . Coronary artery disease    Inferior ST elevation myocardial infarction in July of 2014. Cardiac catheterization showed 95% distal RCA stenosis and 90% first diagonal stenosis with normal ejection fraction. He underwent PCI in bare-metal stent placement to the distal RCA.  . Diabetes mellitus without complication ([place])   . Hypercholesterolemia   . Tobacco use     Current Outpatient Medications  Medication Sig Dispense Refill  . aspirin 81 MG EC tablet Take 1 tablet (81 mg total) by mouth daily. 30 tablet 4  . atorvastatin (LIPITOR) 80 MG tablet Take 80 mg by mouth daily.    . Blood Glucose Monitoring Suppl (BAYER CONTOUR NEXT MONITOR) w/Device KIT 1 Device 1 kit 0  . CONTOUR NEXT TEST test strip  USE 1 STRIP TO CHECK GLUCOSE THREE TIMES DAILY AS NEEDED 100 each 0  . insulin glargine (LANTUS SOLOSTAR) 100 UNIT/ML Solostar Pen INJECT 23 UNITS UNDER THE SKIN EVERY DAY 15 mL 1  . Insulin Pen Needle 31G X 8 MM MISC 1 each by Does not apply route daily. 90 each 1  . losartan (COZAAR) 50 MG tablet Take 1 tablet (50 mg total) by mouth daily. 30 tablet 0  . metFORMIN (GLUCOPHAGE) 1000 MG tablet Take 1 tablet (1,000 mg total) by mouth 2 (two) times daily with a meal. 180 tablet 0  . nicotine (NICODERM CQ - DOSED IN MG/24 HOURS) 21 mg/24hr patch One 28m patch every day to chest wall (okay to substitute generic) 28 patch 0  . nitroGLYCERIN (NITROSTAT) 0.4 MG SL tablet DISSOLVE ONE TABLET UNDER THE TONGUE EVERY 5 MINUTES AS NEEDED FOR CHEST PAIN.  DO NOT EXCEED A TOTAL OF 3 DOSES IN 15 MINUTES 25 tablet 2  . sitaGLIPtin (JANUVIA) 100 MG tablet TAKE 1 TABLET(100 MG) BY MOUTH DAILY 90 tablet 0   No current facility-administered medications for this visit.    No Known Allergies  Family History  Problem Relation Age of Onset  . Arthritis Mother        Rheumatoid  . Diabetes Mother   . Hyperlipidemia Mother   . Diabetes Father   . Cancer Maternal Grandfather  Prostate  . Parkinson's disease Maternal Grandfather     Social History   Socioeconomic History  . Marital status: Married    Spouse name: Not on file  . Number of children: Not on file  . Years of education: Not on file  . Highest education level: Not on file  Occupational History  . Not on file  Tobacco Use  . Smoking status: Current Every Day Smoker    Packs/day: 1.00    Years: 20.00    Pack years: 20.00    Types: Cigarettes  . Smokeless tobacco: Never Used  Substance and Sexual Activity  . Alcohol use: Yes    Alcohol/week: 0.0 standard drinks    Comment: occasional  . Drug use: Yes    Types: Marijuana  . Sexual activity: Yes    Birth control/protection: None  Other Topics Concern  . Not on file  Social  History Narrative  . Not on file   Social Determinants of Health   Financial Resource Strain:   . Difficulty of Paying Living Expenses: Not on file  Food Insecurity:   . Worried About Charity fundraiser in the Last Year: Not on file  . Ran Out of Food in the Last Year: Not on file  Transportation Needs:   . Lack of Transportation (Medical): Not on file  . Lack of Transportation (Non-Medical): Not on file  Physical Activity:   . Days of Exercise per Week: Not on file  . Minutes of Exercise per Session: Not on file  Stress:   . Feeling of Stress : Not on file  Social Connections:   . Frequency of Communication with Friends and Family: Not on file  . Frequency of Social Gatherings with Friends and Family: Not on file  . Attends Religious Services: Not on file  . Active Member of Clubs or Organizations: Not on file  . Attends Archivist Meetings: Not on file  . Marital Status: Not on file  Intimate Partner Violence:   . Fear of Current or Ex-Partner: Not on file  . Emotionally Abused: Not on file  . Physically Abused: Not on file  . Sexually Abused: Not on file     Constitutional: Denies fever, malaise, fatigue, headache or abrupt weight changes.  HEENT: Pt reports numbness of tongue. Denies eye pain, eye redness, ear pain, ringing in the ears, wax buildup, runny nose, nasal congestion, bloody nose, or sore throat. Respiratory: Denies difficulty breathing, shortness of breath, cough or sputum production.   Cardiovascular: Denies chest pain, chest tightness, palpitations or swelling in the hands or feet.  Musculoskeletal: Pt reports left lower extremity weakness. Denies decrease in range of motion, difficulty with gait, muscle pain or joint pain and swelling.  Skin: Denies redness, rashes, lesions or ulcercations.  Neurological: Pt reports lightheadedness, difficulty with balance. Denies difficulty with memory, difficulty with speech or problems with coordination.     No other specific complaints in a complete review of systems (except as listed in HPI above).  Objective:   Physical Exam  BP 128/84   Pulse 75   Temp 98.1 F (36.7 C) (Temporal)   Wt 257 lb (116.6 kg)   SpO2 98%   BMI 36.88 kg/m   Wt Readings from Last 3 Encounters:  12/21/19 252 lb (114.3 kg)  11/13/19 253 lb (114.8 kg)  09/03/19 241 lb 13.5 oz (109.7 kg)    General: Appears his stated age, obese, in NAD. HEENT: Head: normal shape and size; Eyes: sclera  white, no icterus, conjunctiva pink, PERRLA and EOMs intact;  Neck:  Neck supple, trachea midline. No masses, lumps or thyromegaly present.  Cardiovascular: Normal rate and rhythm. S1,S2 noted.  No murmur, rubs or gallops noted. No carotid bruits noted. Pulmonary/Chest: Normal effort and positive vesicular breath sounds. No respiratory distress. No wheezes, rales or ronchi noted.  Musculoskeletal: Weakness noted on left with resistance to shoulder shrug. Strength 5/5 BUE/BLE. Able to stand on tip toes and heels. Off balance with tandem gait, otherwise gait slow but steady without device. Neurological: Alert and oriented. Cranial nerves II-XII grossly intact except CN II. Coordination normal.    BMET    Component Value Date/Time   NA 139 12/21/2019 1606   NA 140 11/13/2019 1550   NA 139 01/29/2013 0040   K 3.7 12/21/2019 1606   K 4.0 01/29/2013 0040   CL 102 12/21/2019 1606   CL 107 01/29/2013 0040   CO2 24 12/21/2019 1606   CO2 28 01/29/2013 0040   GLUCOSE 184 (H) 12/21/2019 1606   GLUCOSE 228 (H) 01/29/2013 0040   BUN 12 12/21/2019 1606   BUN 10 11/13/2019 1550   BUN 15 01/29/2013 0040   CREATININE 1.03 12/21/2019 1606   CREATININE 0.88 01/29/2013 0040   CALCIUM 8.9 12/21/2019 1606   CALCIUM 9.2 01/29/2013 0040   GFRNONAA >60 12/21/2019 1606   GFRNONAA >60 01/29/2013 0040   GFRAA 104 11/13/2019 1550   GFRAA >60 01/29/2013 0040    Lipid Panel     Component Value Date/Time   CHOL 84 12/22/2019 0552    CHOL 133 11/13/2019 1550   TRIG 83 12/22/2019 0552   HDL 20 (L) 12/22/2019 0552   HDL 24 (L) 11/13/2019 1550   CHOLHDL 4.2 12/22/2019 0552   VLDL 17 12/22/2019 0552   LDLCALC 47 12/22/2019 0552   LDLCALC 76 11/13/2019 1550    CBC    Component Value Date/Time   WBC 15.6 (H) 12/21/2019 1606   RBC 4.98 12/21/2019 1606   HGB 16.0 12/21/2019 1606   HGB 16.9 11/13/2019 1550   HCT 45.3 12/21/2019 1606   HCT 49.9 11/13/2019 1550   PLT 258 12/21/2019 1606   PLT 271 11/13/2019 1550   MCV 91.0 12/21/2019 1606   MCV 93 11/13/2019 1550   MCV 93 01/29/2013 0040   MCH 32.1 12/21/2019 1606   MCHC 35.3 12/21/2019 1606   RDW 13.0 12/21/2019 1606   RDW 13.6 11/13/2019 1550   RDW 13.8 01/29/2013 0040   LYMPHSABS 5.1 (H) 12/21/2019 1606   MONOABS 0.8 12/21/2019 1606   EOSABS 0.5 12/21/2019 1606   BASOSABS 0.1 12/21/2019 1606    Hgb A1C Lab Results  Component Value Date   HGBA1C 8.3 (H) 12/22/2019            Assessment & Plan:   Hospital follow-up for Numbness, Lightheadedness, CVA- Left Thalamus, Hypertension, Hyperlipidemia, CAD, DM 2:  Hospital notes, labs and imaging reviewed He maintains good lipid control with Atorvastatin, will continue We will continue aspirin for anticoagulation Blood pressure controlled with Losartan Discussed the importance of good diabetic control, will continue Metformin, Januvia and Lantus- advised him to take Lantus consistently and monitor fasting sugars regularly He declines referral to PT at this time Referral to neurology placed for further evaluation and treatment Will complete FMLA paperwork to be out of work until evaluated by neurology- he will fax these to me  RTC in 3 months for follow up chronic conditions Webb Silversmith, NP This visit occurred  during the SARS-CoV-2 public health emergency.  Safety protocols were in place, including screening questions prior to the visit, additional usage of staff PPE, and extensive cleaning of exam  room while observing appropriate contact time as indicated for disinfecting solutions.

## 2020-01-01 NOTE — Patient Instructions (Signed)

## 2020-01-03 LAB — MISC LABCORP TEST (SEND OUT): Labcorp test code: 15040

## 2020-01-07 ENCOUNTER — Telehealth: Payer: Self-pay

## 2020-01-07 NOTE — Telephone Encounter (Signed)
Martin Mullen,  What is the turnaround time for neurology referrals. He is wanting to be seen sooner.

## 2020-01-07 NOTE — Telephone Encounter (Signed)
New message    Patient asking for a call back - appt with a neurology in mid Jan . wants appt sooner with another neurology.

## 2020-01-13 ENCOUNTER — Telehealth: Payer: Self-pay | Admitting: Internal Medicine

## 2020-01-13 NOTE — Telephone Encounter (Signed)
Patient sent in paperwork. Have filled out what I could and left on providers inbox for review and signature. Please advise as pt stated it is due today.

## 2020-01-13 NOTE — Telephone Encounter (Signed)
Will address tomorrow morning

## 2020-01-13 NOTE — Telephone Encounter (Signed)
Patient called in stating FMLA paperwork is needing to be done by today. Notified patient no paperwork was received and to please fax again to attention of Regina/Carmel Waddington. Patient will fax again and will work on when received.

## 2020-01-14 NOTE — Telephone Encounter (Signed)
Signed, given back to Martin Mullen 

## 2020-01-14 NOTE — Telephone Encounter (Signed)
Neurology offices in [place] are out 2 to 3 months for new patient appointments. [facility] tries to work patients in sooner if it is an emergency. [facility] neurology office is scheduling at the end of January as the first thing available right now.

## 2020-01-14 NOTE — Discharge Summary (Signed)
Patient Left AMA on 12/23/2019  Discharge diagnosis and meds will not be dictated.  Martin Mullen is a 39 year old gentleman with history of MI, diabetes, hypercholesterolemia and tobacco abuse presents with left-sided numbness. He was admitted with acute bilateral filmic infarct. Neurology consultation was made. Patient was started on aspirin and Plavix. His statins were continued.  Patient was also continued on his insulin and hypertension meds.  Patient decided to leave AMA due to some emergency with wife.  Time 10 mins

## 2020-01-14 NOTE — Telephone Encounter (Signed)
Let pt know we have called around and we can not get him in any sooner.

## 2020-01-14 NOTE — Telephone Encounter (Signed)
Paperwork done and faxed.   Copy for scan Copy for patient

## 2020-01-14 NOTE — Telephone Encounter (Signed)
More paperwork in regards to patient sent in. Filled and placed in providers inbox for review.

## 2020-01-14 NOTE — Telephone Encounter (Signed)
Called and notified paperwork ready and faxed per DPR.   Copy for scan Copy for patient

## 2020-01-14 NOTE — Telephone Encounter (Signed)
Done, given back to Martin Mullen 

## 2020-02-22 DIAGNOSIS — Z1152 Encounter for screening for COVID-19: Secondary | ICD-10-CM | POA: Diagnosis not present

## 2020-02-26 ENCOUNTER — Encounter: Payer: Self-pay | Admitting: Internal Medicine

## 2020-02-27 NOTE — Telephone Encounter (Signed)
Ok for note that says pt has had an infarction of the left thalmus.

## 2020-02-28 DIAGNOSIS — I252 Old myocardial infarction: Secondary | ICD-10-CM | POA: Diagnosis not present

## 2020-02-28 DIAGNOSIS — E1159 Type 2 diabetes mellitus with other circulatory complications: Secondary | ICD-10-CM | POA: Diagnosis not present

## 2020-02-28 DIAGNOSIS — Z1589 Genetic susceptibility to other disease: Secondary | ICD-10-CM | POA: Diagnosis not present

## 2020-02-28 DIAGNOSIS — I639 Cerebral infarction, unspecified: Secondary | ICD-10-CM | POA: Diagnosis not present

## 2020-02-28 DIAGNOSIS — Z7189 Other specified counseling: Secondary | ICD-10-CM | POA: Diagnosis not present

## 2020-03-07 DIAGNOSIS — F1411 Cocaine abuse, in remission: Secondary | ICD-10-CM | POA: Insufficient documentation

## 2020-03-25 ENCOUNTER — Inpatient Hospital Stay: Payer: BC Managed Care – PPO

## 2020-03-25 ENCOUNTER — Inpatient Hospital Stay: Payer: BC Managed Care – PPO | Attending: Oncology | Admitting: Oncology

## 2020-03-25 ENCOUNTER — Other Ambulatory Visit: Payer: Self-pay | Admitting: Internal Medicine

## 2020-03-25 ENCOUNTER — Encounter: Payer: Self-pay | Admitting: Oncology

## 2020-03-25 ENCOUNTER — Other Ambulatory Visit: Payer: Self-pay

## 2020-03-25 VITALS — BP 119/88 | HR 95 | Temp 97.8°F | Resp 18 | Ht 70.0 in | Wt 249.1 lb

## 2020-03-25 DIAGNOSIS — Z79899 Other long term (current) drug therapy: Secondary | ICD-10-CM

## 2020-03-25 DIAGNOSIS — D6852 Prothrombin gene mutation: Secondary | ICD-10-CM | POA: Diagnosis not present

## 2020-03-25 DIAGNOSIS — I639 Cerebral infarction, unspecified: Secondary | ICD-10-CM

## 2020-03-25 DIAGNOSIS — F17210 Nicotine dependence, cigarettes, uncomplicated: Secondary | ICD-10-CM | POA: Diagnosis not present

## 2020-03-25 DIAGNOSIS — Z8673 Personal history of transient ischemic attack (TIA), and cerebral infarction without residual deficits: Secondary | ICD-10-CM | POA: Diagnosis not present

## 2020-03-25 DIAGNOSIS — Z720 Tobacco use: Secondary | ICD-10-CM

## 2020-03-25 DIAGNOSIS — Z794 Long term (current) use of insulin: Secondary | ICD-10-CM | POA: Diagnosis not present

## 2020-03-25 DIAGNOSIS — Z8261 Family history of arthritis: Secondary | ICD-10-CM

## 2020-03-25 DIAGNOSIS — E7800 Pure hypercholesterolemia, unspecified: Secondary | ICD-10-CM | POA: Diagnosis not present

## 2020-03-25 DIAGNOSIS — I2510 Atherosclerotic heart disease of native coronary artery without angina pectoris: Secondary | ICD-10-CM | POA: Diagnosis not present

## 2020-03-25 DIAGNOSIS — I252 Old myocardial infarction: Secondary | ICD-10-CM

## 2020-03-25 DIAGNOSIS — Z8042 Family history of malignant neoplasm of prostate: Secondary | ICD-10-CM

## 2020-03-25 DIAGNOSIS — Z8349 Family history of other endocrine, nutritional and metabolic diseases: Secondary | ICD-10-CM | POA: Insufficient documentation

## 2020-03-25 DIAGNOSIS — E1140 Type 2 diabetes mellitus with diabetic neuropathy, unspecified: Secondary | ICD-10-CM

## 2020-03-25 DIAGNOSIS — E119 Type 2 diabetes mellitus without complications: Secondary | ICD-10-CM | POA: Diagnosis not present

## 2020-03-25 DIAGNOSIS — Z833 Family history of diabetes mellitus: Secondary | ICD-10-CM | POA: Diagnosis not present

## 2020-03-25 DIAGNOSIS — Z7982 Long term (current) use of aspirin: Secondary | ICD-10-CM | POA: Insufficient documentation

## 2020-03-25 DIAGNOSIS — IMO0002 Reserved for concepts with insufficient information to code with codable children: Secondary | ICD-10-CM

## 2020-03-25 NOTE — Progress Notes (Signed)
Patient here to establish care. Referred by Dr. Sherryll Burger for pint mutation in the factor II prothrombin gene.

## 2020-03-25 NOTE — Progress Notes (Signed)
Hematology/Oncology Consult note [facility] Telephone:([phone] [phone]   Patient Care Team: Martin Fenton, NP as PCP - General (Internal Medicine)  REFERRING PROVIDER: Vladimir Crofts, MD  CHIEF COMPLAINTS/REASON FOR VISIT:  Evaluation of prothrombin gene mutation  HISTORY OF PRESENTING ILLNESS:   Martin Mullen is a  40 y.o.  male with PMH listed below was seen in consultation at the request of  Vladimir Crofts, MD  for evaluation of prothrombin gene mutation  November 2021, patient had stroke involving left thalamus and right thalamus in the right frontal white matter.  Patient has risk factors including diabetes, hypertension, hyperlipidemia, history of MI, history of substance use, smoking.  MRI Brain 12/21/2019 - 1. Acute infarct in the left thalamus. Small acute infarct right thalamus and right inferior frontal white matter. 2. 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 3. No intracranial large vessel occlusion. There is atherosclerotic disease in the superior cerebellar artery bilaterally.   CT Angio of head and neck: 12/22/2019 - 1. No intracranial large vessel occlusion orproximal high-grade arterial stenosis. 2. Atherosclerotic plaque within both intracranial internal carotid arteries. Up to moderate stenosis within the distal cavernous/paraclinoid left ICA. No more than mild stenosis of the intracranial right ICA. 3. Atherosclerotic irregularity of the distal right PCA. 4. 1-2 mm vascular protrusion arising from the paraclinoid right ICA, which may reflect an infundibulum or small aneurysm.   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  Slight increase in anticardiolipin IgM-14-does not meet antiphospholipid syndrome diagnostic criteria., negative lupus  anticoagulant  Patient is currently taking aspirin 81 mg daily.  Patient smokes about a pack of cigarettes daily. No family history of DVT.  His father has a history of stroke.  Maternal grandfather had prostate cancer.  Review of Systems  Constitutional: Negative for appetite change, chills, fatigue, fever and unexpected weight change.  HENT:   Negative for hearing loss and voice change.   Eyes: Negative for eye problems and icterus.  Respiratory: Negative for chest tightness, cough and shortness of breath.   Cardiovascular: Negative for chest pain and leg swelling.  Gastrointestinal: Negative for abdominal distention and abdominal pain.  Endocrine: Negative for hot flashes.  Genitourinary: Negative for difficulty urinating, dysuria and frequency.   Musculoskeletal: Negative for arthralgias.  Skin: Negative for itching and rash.  Neurological: Negative for light-headedness and numbness.  Hematological: Negative for adenopathy. Does not bruise/bleed easily.  Psychiatric/Behavioral: Negative for confusion.    MEDICAL HISTORY:  Past Medical History:  Diagnosis Date  . Acute transmural inferior wall MI ([place]) 08/28/2012   .5 x 12 mm Veri-flex stent non-DES.  Marland Kitchen Chicken pox   . Coronary artery disease    Inferior ST elevation myocardial infarction in July of 2014. Cardiac catheterization showed 95% distal RCA stenosis and 90% first diagonal stenosis with normal ejection fraction. He underwent PCI in bare-metal stent placement to the distal RCA.  . Diabetes mellitus without complication ([place])   . Hypercholesterolemia   . Tobacco use     SURGICAL HISTORY: Past Surgical History:  Procedure Laterality Date  . CORONARY ANGIOPLASTY WITH STENT PLACEMENT    . DEBRIDEMENT AND CLOSURE WOUND N/A 09/03/2019   Procedure: Debridement and closure of scrotal wound;  Surgeon: Cindra Presume, MD;  Location: [facility];  Service: Plastics;  Laterality: N/A;  . DEBRIDEMENT  AND CLOSURE  WOUND N/A 07/25/2019   Procedure: DEBRIDEMENT AND CLOSURE WOUND;  Surgeon: Cindra Presume, MD;  Location: [facility];  Service: Plastics;  Laterality: N/A;  . INCISION AND DRAINAGE ABSCESS N/A 07/20/2019   Procedure: INCISION AND DRAINAGE ABSCESS;  Surgeon: Lucas Mallow, MD;  Location: [facility];  Service: Urology;  Laterality: N/A;  . LEFT HEART CATHETERIZATION WITH CORONARY ANGIOGRAM N/A 08/28/2012   Procedure: LEFT HEART CATHETERIZATION WITH CORONARY ANGIOGRAM;  Surgeon: Laverda Page, MD;  Location: [facility] CATH LAB;  Service: Cardiovascular;  Laterality: N/A;  . WRIST SURGERY  1988    SOCIAL HISTORY: Social History   Socioeconomic History  . Marital status: Married    Spouse name: Not on file  . Number of children: Not on file  . Years of education: Not on file  . Highest education level: Not on file  Occupational History  . Not on file  Tobacco Use  . Smoking status: Current Every Day Smoker    Packs/day: 0.33    Years: 30.00    Pack years: 9.90    Types: Cigarettes  . Smokeless tobacco: Never Used  Vaping Use  . Vaping Use: Never used  Substance and Sexual Activity  . Alcohol use: Not Currently    Alcohol/week: 0.0 standard drinks    Comment: occasional  . Drug use: Yes    Types: Marijuana    Comment: quit about 1 month ago   . Sexual activity: Yes    Birth control/protection: None  Other Topics Concern  . Not on file  Social History Narrative  . Not on file   Social Determinants of Health   Financial Resource Strain: Not on file  Food Insecurity: Not on file  Transportation Needs: Not on file  Physical Activity: Not on file  Stress: Not on file  Social Connections: Not on file  Intimate Partner Violence: Not on file    FAMILY HISTORY: Family History  Problem Relation Age of Onset  . Arthritis Mother        Rheumatoid  . Diabetes Mother   . Hyperlipidemia Mother   . Diabetes Father   . Cancer Maternal Grandfather        Prostate  . Parkinson's  disease Maternal Grandfather     ALLERGIES:  has No Known Allergies.  MEDICATIONS:  Current Outpatient Medications  Medication Sig Dispense Refill  . aspirin 81 MG EC tablet Take 1 tablet (81 mg total) by mouth daily. 30 tablet 4  . atorvastatin (LIPITOR) 80 MG tablet Take 80 mg by mouth daily.    . Blood Glucose Monitoring Suppl (BAYER CONTOUR NEXT MONITOR) w/Device KIT 1 Device 1 kit 0  . CONTOUR NEXT TEST test strip USE 1 STRIP TO CHECK GLUCOSE THREE TIMES DAILY AS NEEDED 100 each 0  . insulin glargine (LANTUS SOLOSTAR) 100 UNIT/ML Solostar Pen INJECT 23 UNITS UNDER THE SKIN EVERY DAY 15 mL 1  . Insulin Pen Needle 31G X 8 MM MISC 1 each by Does not apply route daily. 90 each 1  . losartan (COZAAR) 50 MG tablet Take 1 tablet (50 mg total) by mouth daily. 30 tablet 0  . nicotine (NICODERM CQ - DOSED IN MG/24 HOURS) 21 mg/24hr patch One 23m patch every day to chest wall (okay to substitute generic) 28 patch 0  . metFORMIN (GLUCOPHAGE) 1000 MG tablet TAKE 1 TABLET(1000 MG) BY MOUTH TWICE DAILY WITH A MEAL 180 tablet 0  . nitroGLYCERIN (NITROSTAT) 0.4 MG SL tablet DISSOLVE  ONE TABLET UNDER THE TONGUE EVERY 5 MINUTES AS NEEDED FOR CHEST PAIN.  DO NOT EXCEED A TOTAL OF 3 DOSES IN 15 MINUTES (Patient not taking: Reported on 03/25/2020) 25 tablet 2  . sitaGLIPtin (JANUVIA) 100 MG tablet TAKE 1 TABLET(100 MG) BY MOUTH DAILY 90 tablet 0   No current facility-administered medications for this visit.     PHYSICAL EXAMINATION: ECOG PERFORMANCE STATUS: 0 - Asymptomatic Vitals:   03/25/20 0940  BP: 119/88  Pulse: 95  Resp: 18  Temp: 97.8 F (36.6 C)  SpO2: 97%   Filed Weights   03/25/20 0940  Weight: 249 lb 1.6 oz (113 kg)    Physical Exam Constitutional:      General: He is not in acute distress. HENT:     Head: Normocephalic and atraumatic.  Eyes:     General: No scleral icterus. Cardiovascular:     Rate and Rhythm: Normal rate and regular rhythm.     Heart sounds: Normal heart  sounds.  Pulmonary:     Effort: Pulmonary effort is normal. No respiratory distress.     Breath sounds: No wheezing.  Abdominal:     General: Bowel sounds are normal. There is no distension.     Palpations: Abdomen is soft.  Musculoskeletal:        General: No deformity. Normal range of motion.     Cervical back: Normal range of motion and neck supple.  Skin:    General: Skin is warm and dry.     Findings: No erythema or rash.  Neurological:     Mental Status: He is alert and oriented to person, place, and time. Mental status is at baseline.     Cranial Nerves: No cranial nerve deficit.     Coordination: Coordination normal.  Psychiatric:        Mood and Affect: Mood normal.     LABORATORY DATA:  I have reviewed the data as listed Lab Results  Component Value Date   WBC 15.6 (H) 12/21/2019   HGB 16.0 12/21/2019   HCT 45.3 12/21/2019   MCV 91.0 12/21/2019   PLT 258 12/21/2019   Recent Labs    07/20/19 1005 07/21/19 0641 07/29/19 1010 08/30/19 1455 11/13/19 1550 12/21/19 1606  NA 127*   < > 140 136 140 139  K 3.8   < > 4.1 4.9 4.0 3.7  CL 94*   < > 99 102 101 102  CO2 20*   < > 28 25 24 24   GLUCOSE 307*   < > 162* 320* 191* 184*  BUN 11   < > 8 9 10 12   CREATININE 1.10   < > 1.31* 0.99 1.04 1.03  CALCIUM 8.3*   < > 9.7 8.8* 9.4 8.9  GFRNONAA >60   < > 68 >60 90 >60  GFRAA >60   < > 79 >60 104  --   PROT 7.0  --   --   --  7.4 7.1  ALBUMIN 3.4*  --   --   --  4.6 3.9  AST 16  --   --   --  23 30  ALT 21  --   --   --  53* 42  ALKPHOS 91  --   --   --  149* 99  BILITOT 1.6*  --   --   --  0.3 0.5   < > = values in this interval not displayed.   Iron/TIBC/Ferritin/ %Sat No results found for: IRON,  TIBC, FERRITIN, IRONPCTSAT    RADIOGRAPHIC STUDIES: I have personally reviewed the radiological images as listed and agreed with the findings in the report. No results found.    ASSESSMENT & PLAN:  1. 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  Cerebrovascular accident (CVA), unspecified mechanism ([place])   3. Tobacco abuse    #Heterozygous prothrombin gene mutation Discussed with patient that prothrombin G20210A is the second most common inherited thrombophilia The risk of venous thrombosis in individuals who are heterozygous for prothrombin G20210A has been estimated to be increased 3-4 fold..  It is unclear whether prothrombin G20210A increases the risk of VTE recurrence. Prothrombin G20210A does not appear to be a major risk factor for arterial thrombosis  History of stroke at young age, he has other risk factors including diabetes, history of MI, hypertension, smoking etc.  Continue aspirin 81 mg and statin as per neurology recommendation.  And follow-up with neurology. Smoke cessation was discussed in details with patient.  Had extensive hypercoagulable work-up already.  I will check CBC, CMP, JAK2 mutation with reflex.  If all results are negative, I do not think patient needs to follow up with me.  He can come back if he develops new related symptoms or concerns.  Patient agrees with plan.  He prefers to do labs at [facility].  Prescription was provided to him.  Orders Placed This Encounter  Procedures  . Comprehensive metabolic panel    Standing Status:   Future    Standing Expiration Date:   03/25/2021  . CBC with Differential/Platelet    Standing Status:   Future    Standing Expiration Date:   03/25/2021  . JAK2 V617F, w Reflex to CALR/E12/MPL    Standing Status:   Future    Standing Expiration Date:   03/25/2021    All questions were answered. The patient knows to call the clinic with any problems questions or concerns.  cc Vladimir Crofts, MD    Return of visit: No need to follow-up if his results are normal Thank you for this kind referral and the opportunity to participate in the care of this patient. A copy of today's note is routed to referring provider    Earlie Server, MD, PhD Hematology Oncology [facility] Pager- [phone] 03/25/2020

## 2020-03-26 ENCOUNTER — Encounter: Payer: Self-pay | Admitting: Oncology

## 2020-04-01 ENCOUNTER — Other Ambulatory Visit: Payer: Self-pay

## 2020-04-02 ENCOUNTER — Encounter: Payer: Self-pay | Admitting: Internal Medicine

## 2020-04-02 ENCOUNTER — Ambulatory Visit: Payer: BC Managed Care – PPO | Admitting: Internal Medicine

## 2020-04-02 VITALS — BP 126/84 | HR 93 | Temp 96.9°F | Wt 249.0 lb

## 2020-04-02 DIAGNOSIS — E1169 Type 2 diabetes mellitus with other specified complication: Secondary | ICD-10-CM | POA: Diagnosis not present

## 2020-04-02 DIAGNOSIS — K219 Gastro-esophageal reflux disease without esophagitis: Secondary | ICD-10-CM | POA: Diagnosis not present

## 2020-04-02 DIAGNOSIS — I10 Essential (primary) hypertension: Secondary | ICD-10-CM

## 2020-04-02 DIAGNOSIS — F4323 Adjustment disorder with mixed anxiety and depressed mood: Secondary | ICD-10-CM

## 2020-04-02 DIAGNOSIS — I213 ST elevation (STEMI) myocardial infarction of unspecified site: Secondary | ICD-10-CM

## 2020-04-02 DIAGNOSIS — E785 Hyperlipidemia, unspecified: Secondary | ICD-10-CM | POA: Diagnosis not present

## 2020-04-02 DIAGNOSIS — I639 Cerebral infarction, unspecified: Secondary | ICD-10-CM

## 2020-04-02 DIAGNOSIS — I2510 Atherosclerotic heart disease of native coronary artery without angina pectoris: Secondary | ICD-10-CM

## 2020-04-02 DIAGNOSIS — E782 Mixed hyperlipidemia: Secondary | ICD-10-CM | POA: Diagnosis not present

## 2020-04-02 LAB — COMPREHENSIVE METABOLIC PANEL
ALT: 27 U/L (ref 0–53)
AST: 18 U/L (ref 0–37)
Albumin: 4 g/dL (ref 3.5–5.2)
Alkaline Phosphatase: 118 U/L — ABNORMAL HIGH (ref 39–117)
BUN: 12 mg/dL (ref 6–23)
CO2: 28 mEq/L (ref 19–32)
Calcium: 8.9 mg/dL (ref 8.4–10.5)
Chloride: 101 mEq/L (ref 96–112)
Creatinine, Ser: 0.98 mg/dL (ref 0.40–1.50)
GFR: 96.87 mL/min (ref >60.00)
Glucose, Bld: 305 mg/dL — ABNORMAL HIGH (ref 70–99)
Potassium: 4.3 mEq/L (ref 3.5–5.1)
Sodium: 134 mEq/L — ABNORMAL LOW (ref 135–145)
Total Bilirubin: 0.4 mg/dL (ref 0.2–1.2)
Total Protein: 7 g/dL (ref 6.0–8.3)

## 2020-04-02 LAB — LIPID PANEL
Cholesterol: 91 mg/dL (ref 0–200)
HDL: 25.3 mg/dL — ABNORMAL LOW (ref >39.00)
LDL Cholesterol: 36 mg/dL (ref 0–99)
NonHDL: 65.55
Total CHOL/HDL Ratio: 4
Triglycerides: 150 mg/dL — ABNORMAL HIGH (ref 0.0–149.0)
VLDL: 30 mg/dL (ref 0.0–40.0)

## 2020-04-02 LAB — HEMOGLOBIN A1C: Hgb A1c MFr Bld: 10.8 % — ABNORMAL HIGH (ref 4.6–6.5)

## 2020-04-02 NOTE — Progress Notes (Signed)
Subjective:    Patient ID: Martin Mullen, male    DOB: 08-Sep-1980, 40 y.o.   MRN: [id number]  HPI  Patient presents the clinic today for follow-up of chronic conditions.  Anxiety and Depression: Currently not an issue.  He is not taking any medications for mood at this time.  He is not currently seeing a therapist.  He denies SI/HI.  HLD with CAD s/p MI, CVA: His last LDL was 47, 12/2019.  He denies myalgias on Atorvastatin.  He is taking aspirin as well he does not consume a low-fat diet.  HTN: His BP today is126/84.  He is taking Losartan as prescribed.  ECG from 12/2019 reviewed.  DM 2: His last A1c was 8.3%, 12/2019 he is not checking his sugars.  He reports he does not remember daily to take his Lantus but is taking Metformin and Januvia.  His last eye exam was a few months ago.  He checks his feet routinely.  Flu 10/2019. Pneumovax 06/2017. Covid: never  GERD: He denies any issues with reflux off medications.  There is no upper GI on file.  Review of Systems      Past Medical History:  Diagnosis Date  . Acute transmural inferior wall MI ([place]) 08/28/2012   .5 x 12 mm Veri-flex stent non-DES.  Marland Kitchen Chicken pox   . Coronary artery disease    Inferior ST elevation myocardial infarction in July of 2014. Cardiac catheterization showed 95% distal RCA stenosis and 90% first diagonal stenosis with normal ejection fraction. He underwent PCI in bare-metal stent placement to the distal RCA.  . Diabetes mellitus without complication ([place])   . Hypercholesterolemia   . Tobacco use     Current Outpatient Medications  Medication Sig Dispense Refill  . aspirin 81 MG EC tablet Take 1 tablet (81 mg total) by mouth daily. 30 tablet 4  . atorvastatin (LIPITOR) 80 MG tablet Take 80 mg by mouth daily.    . Blood Glucose Monitoring Suppl (BAYER CONTOUR NEXT MONITOR) w/Device KIT 1 Device 1 kit 0  . CONTOUR NEXT TEST test strip USE 1 STRIP TO CHECK GLUCOSE THREE TIMES DAILY AS NEEDED 100 each 0  .  insulin glargine (LANTUS SOLOSTAR) 100 UNIT/ML Solostar Pen INJECT 23 UNITS UNDER THE SKIN EVERY DAY 15 mL 1  . Insulin Pen Needle 31G X 8 MM MISC 1 each by Does not apply route daily. 90 each 1  . losartan (COZAAR) 50 MG tablet Take 1 tablet (50 mg total) by mouth daily. 30 tablet 0  . metFORMIN (GLUCOPHAGE) 1000 MG tablet TAKE 1 TABLET(1000 MG) BY MOUTH TWICE DAILY WITH A MEAL 180 tablet 0  . nicotine (NICODERM CQ - DOSED IN MG/24 HOURS) 21 mg/24hr patch One 70m patch every day to chest wall (okay to substitute generic) 28 patch 0  . nitroGLYCERIN (NITROSTAT) 0.4 MG SL tablet DISSOLVE ONE TABLET UNDER THE TONGUE EVERY 5 MINUTES AS NEEDED FOR CHEST PAIN.  DO NOT EXCEED A TOTAL OF 3 DOSES IN 15 MINUTES (Patient not taking: Reported on 03/25/2020) 25 tablet 2  . sitaGLIPtin (JANUVIA) 100 MG tablet TAKE 1 TABLET(100 MG) BY MOUTH DAILY 90 tablet 0   No current facility-administered medications for this visit.    No Known Allergies  Family History  Problem Relation Age of Onset  . Arthritis Mother        Rheumatoid  . Diabetes Mother   . Hyperlipidemia Mother   . Diabetes Father   . Cancer Maternal  Grandfather        Prostate  . Parkinson's disease Maternal Grandfather     Social History   Socioeconomic History  . Marital status: Married    Spouse name: Not on file  . Number of children: Not on file  . Years of education: Not on file  . Highest education level: Not on file  Occupational History  . Not on file  Tobacco Use  . Smoking status: Current Every Day Smoker    Packs/day: 0.33    Years: 30.00    Pack years: 9.90    Types: Cigarettes  . Smokeless tobacco: Never Used  Vaping Use  . Vaping Use: Never used  Substance and Sexual Activity  . Alcohol use: Not Currently    Alcohol/week: 0.0 standard drinks    Comment: occasional  . Drug use: Yes    Types: Marijuana    Comment: quit about 1 month ago   . Sexual activity: Yes    Birth control/protection: None  Other  Topics Concern  . Not on file  Social History Narrative  . Not on file   Social Determinants of Health   Financial Resource Strain: Not on file  Food Insecurity: Not on file  Transportation Needs: Not on file  Physical Activity: Not on file  Stress: Not on file  Social Connections: Not on file  Intimate Partner Violence: Not on file     Constitutional: Denies fever, malaise, fatigue, headache or abrupt weight changes.  Respiratory: Denies difficulty breathing, shortness of breath, cough or sputum production.   Cardiovascular: Denies chest pain, chest tightness, palpitations or swelling in the hands or feet.  Gastrointestinal: Denies abdominal pain, bloating, constipation, diarrhea or blood in the stool.  Skin: Denies redness, rashes, lesions or ulcercations.  Neurological: Denies dizziness, difficulty with memory, difficulty with speech or problems with balance and coordination.  Psych: Denies anxiety, depression, SI/HI.  No other specific complaints in a complete review of systems (except as listed in HPI above).  Objective:   Physical Exam  BP 126/84   Pulse 93   Temp (!) 96.9 F (36.1 C) (Temporal)   Wt 249 lb (112.9 kg)   SpO2 98%   BMI 35.73 kg/m   Wt Readings from Last 3 Encounters:  03/25/20 249 lb 1.6 oz (113 kg)  01/01/20 257 lb (116.6 kg)  12/21/19 252 lb (114.3 kg)    General: Appears his stated age, obese, in NAD. Skin: Warm, dry and intact. No ulcerations noted. HEENT: Head: normal shape and size; Eyes: sclera white, no icterus, conjunctiva pink, PERRLA and EOMs intact;  Cardiovascular: Normal rate and rhythm. S1,S2 noted.  No murmur, rubs or gallops noted. No JVD or BLE edema.  Pulmonary/Chest: Normal effort and positive vesicular breath sounds. No respiratory distress. No wheezes, rales or ronchi noted.  Musculoskeletal:  No difficulty with gait.  Neurological: Alert and oriented.  Psychiatric: Mood and affect normal. Behavior is normal. Judgment and  thought content normal.     BMET    Component Value Date/Time   NA 139 12/21/2019 1606   NA 140 11/13/2019 1550   NA 139 01/29/2013 0040   K 3.7 12/21/2019 1606   K 4.0 01/29/2013 0040   CL 102 12/21/2019 1606   CL 107 01/29/2013 0040   CO2 24 12/21/2019 1606   CO2 28 01/29/2013 0040   GLUCOSE 184 (H) 12/21/2019 1606   GLUCOSE 228 (H) 01/29/2013 0040   BUN 12 12/21/2019 1606   BUN 10 11/13/2019 1550  BUN 15 01/29/2013 0040   CREATININE 1.03 12/21/2019 1606   CREATININE 0.88 01/29/2013 0040   CALCIUM 8.9 12/21/2019 1606   CALCIUM 9.2 01/29/2013 0040   GFRNONAA >60 12/21/2019 1606   GFRNONAA >60 01/29/2013 0040   GFRAA 104 11/13/2019 1550   GFRAA >60 01/29/2013 0040    Lipid Panel     Component Value Date/Time   CHOL 84 12/22/2019 0552   CHOL 133 11/13/2019 1550   TRIG 83 12/22/2019 0552   HDL 20 (L) 12/22/2019 0552   HDL 24 (L) 11/13/2019 1550   CHOLHDL 4.2 12/22/2019 0552   VLDL 17 12/22/2019 0552   LDLCALC 47 12/22/2019 0552   LDLCALC 76 11/13/2019 1550    CBC    Component Value Date/Time   WBC 15.6 (H) 12/21/2019 1606   RBC 4.98 12/21/2019 1606   HGB 16.0 12/21/2019 1606   HGB 16.9 11/13/2019 1550   HCT 45.3 12/21/2019 1606   HCT 49.9 11/13/2019 1550   PLT 258 12/21/2019 1606   PLT 271 11/13/2019 1550   MCV 91.0 12/21/2019 1606   MCV 93 11/13/2019 1550   MCV 93 01/29/2013 0040   MCH 32.1 12/21/2019 1606   MCHC 35.3 12/21/2019 1606   RDW 13.0 12/21/2019 1606   RDW 13.6 11/13/2019 1550   RDW 13.8 01/29/2013 0040   LYMPHSABS 5.1 (H) 12/21/2019 1606   MONOABS 0.8 12/21/2019 1606   EOSABS 0.5 12/21/2019 1606   BASOSABS 0.1 12/21/2019 1606    Hgb A1C Lab Results  Component Value Date   HGBA1C 8.3 (H) 12/22/2019          Assessment & Plan:     Webb Silversmith, NP This visit occurred during the SARS-CoV-2 public health emergency.  Safety protocols were in place, including screening questions prior to the visit, additional usage of staff  PPE, and extensive cleaning of exam room while observing appropriate contact time as indicated for disinfecting solutions.

## 2020-04-06 ENCOUNTER — Telehealth: Payer: Self-pay

## 2020-04-06 ENCOUNTER — Encounter: Payer: Self-pay | Admitting: Oncology

## 2020-04-06 NOTE — Assessment & Plan Note (Signed)
C-Met and lipid profile today Encouraged him to consume a low-fat diet Continue atorvastatin 

## 2020-04-06 NOTE — Telephone Encounter (Signed)
Per Lab Results: A1C up to 10.8%, not good. Needs to be more consistent with insulin. I need to know what his fasting sugars are in 2 weeks. Liver and kidney function normal. Cholesterol is reasonable. Follow up with me in 3 months, but need to send mychart message in 2 weeks with fasting sugars.  Left detailed message on VM per DPR.

## 2020-04-06 NOTE — Assessment & Plan Note (Signed)
Continue atorvastatin, losartan and aspirin We will monitor

## 2020-04-06 NOTE — Assessment & Plan Note (Signed)
No residual effect Continue atorvastatin, aspirin and losartan

## 2020-04-06 NOTE — Assessment & Plan Note (Signed)
C-Met, lipid and A1c today No urine microalbumin secondary to ARB therapy Encouraged him to consume a low-carb, low-fat diet and exercise for weight loss Encouraged him to use Lantus consistently, continue glipizide and Januvia Encourage routine eye exams Encourage routine foot exams Flu and Pneumovax UTD He declines COVID vaccine

## 2020-04-06 NOTE — Assessment & Plan Note (Signed)
No angina Continue losartan, atorvastatin and aspirin as prescribed We will monitor

## 2020-04-06 NOTE — Assessment & Plan Note (Signed)
No issues off meds We will monitor 

## 2020-04-06 NOTE — Assessment & Plan Note (Signed)
Controlled on losartan Reinforced DASH diet and exercise for weight loss C met today

## 2020-04-06 NOTE — Patient Instructions (Signed)
Heart-Healthy Eating Plan Heart-healthy meal planning includes:  Eating less unhealthy fats.  Eating more healthy fats.  Making other changes in your diet. Talk with your doctor or a diet specialist (dietitian) to create an eating plan that is right for you. What is my plan? Your doctor may recommend an eating plan that includes:  Total fat: ______% or less of total calories a day.  Saturated fat: ______% or less of total calories a day.  Cholesterol: less than _________mg a day. What are tips for following this plan? Cooking Avoid frying your food. Try to bake, boil, grill, or broil it instead. You can also reduce fat by:  Removing the skin from poultry.  Removing all visible fats from meats.  Steaming vegetables in water or broth. Meal planning  At meals, divide your plate into four equal parts: ? Fill one-half of your plate with vegetables and green salads. ? Fill one-fourth of your plate with whole grains. ? Fill one-fourth of your plate with lean protein foods.  Eat 4-5 servings of vegetables per day. A serving of vegetables is: ? 1 cup of raw or cooked vegetables. ? 2 cups of raw leafy greens.  Eat 4-5 servings of fruit per day. A serving of fruit is: ? 1 medium whole fruit. ?  cup of dried fruit. ?  cup of fresh, frozen, or canned fruit. ?  cup of 100% fruit juice.  Eat more foods that have soluble fiber. These are apples, broccoli, carrots, beans, peas, and barley. Try to get 20-30 g of fiber per day.  Eat 4-5 servings of nuts, legumes, and seeds per week: ? 1 serving of dried beans or legumes equals  cup after being cooked. ? 1 serving of nuts is  cup. ? 1 serving of seeds equals 1 tablespoon.   General information  Eat more home-cooked food. Eat less restaurant, buffet, and fast food.  Limit or avoid alcohol.  Limit foods that are high in starch and sugar.  Avoid fried foods.  Lose weight if you are overweight.  Keep track of how much salt  (sodium) you eat. This is important if you have high blood pressure. Ask your doctor to tell you more about this.  Try to add vegetarian meals each week. Fats  Choose healthy fats. These include olive oil and canola oil, flaxseeds, walnuts, almonds, and seeds.  Eat more omega-3 fats. These include salmon, mackerel, sardines, tuna, flaxseed oil, and ground flaxseeds. Try to eat fish at least 2 times each week.  Check food labels. Avoid foods with trans fats or high amounts of saturated fat.  Limit saturated fats. ? These are often found in animal products, such as meats, butter, and cream. ? These are also found in plant foods, such as palm oil, palm kernel oil, and coconut oil.  Avoid foods with partially hydrogenated oils in them. These have trans fats. Examples are stick margarine, some tub margarines, cookies, crackers, and other baked goods. What foods can I eat? Fruits All fresh, canned (in natural juice), or frozen fruits. Vegetables Fresh or frozen vegetables (raw, steamed, roasted, or grilled). Green salads. Grains Most grains. Choose whole wheat and whole grains most of the time. Rice and pasta, including brown rice and pastas made with whole wheat. Meats and other proteins Lean, well-trimmed beef, veal, pork, and lamb. Chicken and turkey without skin. All fish and shellfish. Wild duck, rabbit, pheasant, and venison. Egg whites or low-cholesterol egg substitutes. Dried beans, peas, lentils, and tofu. Seeds and   most nuts. Dairy Low-fat or nonfat cheeses, including ricotta and mozzarella. Skim or 1% milk that is liquid, powdered, or evaporated. Buttermilk that is made with low-fat milk. Nonfat or low-fat yogurt. Fats and oils Non-hydrogenated (trans-free) margarines. Vegetable oils, including soybean, sesame, sunflower, olive, peanut, safflower, corn, canola, and cottonseed. Salad dressings or mayonnaise made with a vegetable oil. Beverages Mineral water. Coffee and tea. Diet  carbonated beverages. Sweets and desserts Sherbet, gelatin, and fruit ice. Small amounts of dark chocolate. Limit all sweets and desserts. Seasonings and condiments All seasonings and condiments. The items listed above may not be a complete list of foods and drinks you can eat. Contact a dietitian for more options. What foods should I avoid? Fruits Canned fruit in heavy syrup. Fruit in cream or butter sauce. Fried fruit. Limit coconut. Vegetables Vegetables cooked in cheese, cream, or butter sauce. Fried vegetables. Grains Breads that are made with saturated or trans fats, oils, or whole milk. Croissants. Sweet rolls. Donuts. High-fat crackers, such as cheese crackers. Meats and other proteins Fatty meats, such as hot dogs, ribs, sausage, bacon, rib-eye roast or steak. High-fat deli meats, such as salami and bologna. Caviar. Domestic duck and goose. Organ meats, such as liver. Dairy Cream, sour cream, cream cheese, and creamed cottage cheese. Whole-milk cheeses. Whole or 2% milk that is liquid, evaporated, or condensed. Whole buttermilk. Cream sauce or high-fat cheese sauce. Yogurt that is made from whole milk. Fats and oils Meat fat, or shortening. Cocoa butter, hydrogenated oils, palm oil, coconut oil, palm kernel oil. Solid fats and shortenings, including bacon fat, salt pork, lard, and butter. Nondairy cream substitutes. Salad dressings with cheese or sour cream. Beverages Regular sodas and juice drinks with added sugar. Sweets and desserts Frosting. Pudding. Cookies. Cakes. Pies. Milk chocolate or white chocolate. Buttered syrups. Full-fat ice cream or ice cream drinks. The items listed above may not be a complete list of foods and drinks to avoid. Contact a dietitian for more information. Summary  Heart-healthy meal planning includes eating less unhealthy fats, eating more healthy fats, and making other changes in your diet.  Eat a balanced diet. This includes fruits and  vegetables, low-fat or nonfat dairy, lean protein, nuts and legumes, whole grains, and heart-healthy oils and fats. This information is not intended to replace advice given to you by your health care provider. Make sure you discuss any questions you have with your health care provider. Document Revised: 04/06/2017 Document Reviewed: 03/10/2017 Elsevier Patient Education  2021 Elsevier Inc.   

## 2020-04-06 NOTE — Assessment & Plan Note (Signed)
In remission We will monitor 

## 2020-04-07 ENCOUNTER — Encounter: Payer: Self-pay | Admitting: Internal Medicine

## 2020-04-30 MED ORDER — LANTUS SOLOSTAR 100 UNIT/ML ~~LOC~~ SOPN
PEN_INJECTOR | SUBCUTANEOUS | 1 refills | Status: DC
Start: 2020-04-30 — End: 2020-09-29

## 2020-06-17 ENCOUNTER — Emergency Department
Admission: EM | Admit: 2020-06-17 | Discharge: 2020-06-17 | Disposition: A | Discharge status: Home / Self Care | Payer: BC Managed Care – PPO | Attending: Emergency Medicine | Admitting: Emergency Medicine

## 2020-06-17 ENCOUNTER — Emergency Department: Payer: BC Managed Care – PPO

## 2020-06-17 ENCOUNTER — Encounter: Payer: Self-pay | Admitting: Emergency Medicine

## 2020-06-17 ENCOUNTER — Other Ambulatory Visit: Payer: Self-pay

## 2020-06-17 ENCOUNTER — Other Ambulatory Visit: Payer: Self-pay | Admitting: Internal Medicine

## 2020-06-17 DIAGNOSIS — F17210 Nicotine dependence, cigarettes, uncomplicated: Secondary | ICD-10-CM | POA: Insufficient documentation

## 2020-06-17 DIAGNOSIS — M545 Low back pain, unspecified: Secondary | ICD-10-CM | POA: Diagnosis not present

## 2020-06-17 DIAGNOSIS — R519 Headache, unspecified: Secondary | ICD-10-CM | POA: Diagnosis not present

## 2020-06-17 DIAGNOSIS — IMO0002 Reserved for concepts with insufficient information to code with codable children: Secondary | ICD-10-CM

## 2020-06-17 DIAGNOSIS — I10 Essential (primary) hypertension: Secondary | ICD-10-CM | POA: Insufficient documentation

## 2020-06-17 DIAGNOSIS — S199XXA Unspecified injury of neck, initial encounter: Secondary | ICD-10-CM | POA: Diagnosis not present

## 2020-06-17 DIAGNOSIS — Z79899 Other long term (current) drug therapy: Secondary | ICD-10-CM | POA: Insufficient documentation

## 2020-06-17 DIAGNOSIS — S161XXA Strain of muscle, fascia and tendon at neck level, initial encounter: Secondary | ICD-10-CM | POA: Diagnosis not present

## 2020-06-17 DIAGNOSIS — S39012A Strain of muscle, fascia and tendon of lower back, initial encounter: Secondary | ICD-10-CM | POA: Diagnosis not present

## 2020-06-17 DIAGNOSIS — Z7982 Long term (current) use of aspirin: Secondary | ICD-10-CM | POA: Insufficient documentation

## 2020-06-17 DIAGNOSIS — E1140 Type 2 diabetes mellitus with diabetic neuropathy, unspecified: Secondary | ICD-10-CM

## 2020-06-17 DIAGNOSIS — Z041 Encounter for examination and observation following transport accident: Secondary | ICD-10-CM | POA: Diagnosis not present

## 2020-06-17 DIAGNOSIS — I2510 Atherosclerotic heart disease of native coronary artery without angina pectoris: Secondary | ICD-10-CM | POA: Insufficient documentation

## 2020-06-17 DIAGNOSIS — Z7984 Long term (current) use of oral hypoglycemic drugs: Secondary | ICD-10-CM | POA: Diagnosis not present

## 2020-06-17 DIAGNOSIS — Y92410 Unspecified street and highway as the place of occurrence of the external cause: Secondary | ICD-10-CM | POA: Insufficient documentation

## 2020-06-17 DIAGNOSIS — Z794 Long term (current) use of insulin: Secondary | ICD-10-CM | POA: Insufficient documentation

## 2020-06-17 DIAGNOSIS — M542 Cervicalgia: Secondary | ICD-10-CM | POA: Diagnosis not present

## 2020-06-17 DIAGNOSIS — E119 Type 2 diabetes mellitus without complications: Secondary | ICD-10-CM | POA: Diagnosis not present

## 2020-06-17 MED ORDER — CYCLOBENZAPRINE HCL 10 MG PO TABS: 10.0000 mg | ORAL_TABLET | Freq: Three times a day (TID) | ORAL | 0 refills | Status: DC | PRN

## 2020-06-17 MED ORDER — HYDROCODONE-ACETAMINOPHEN 5-325 MG PO TABS: 1.0000 | ORAL_TABLET | Freq: Four times a day (QID) | ORAL | 0 refills | Status: DC | PRN

## 2020-06-17 MED ORDER — NAPROXEN 375 MG PO TABS: 375.0000 mg | ORAL_TABLET | Freq: Two times a day (BID) | ORAL | 0 refills | Status: AC

## 2020-06-17 MED ORDER — HYDROCODONE-ACETAMINOPHEN 5-325 MG PO TABS
2.0000 | ORAL_TABLET | Freq: Once | ORAL | Status: AC
  Administered 2020-06-17: 2 via ORAL
  Filled 2020-06-17: qty 2

## 2020-06-17 MED ORDER — KETOROLAC TROMETHAMINE 60 MG/2ML IM SOLN
60.0000 mg | Freq: Once | INTRAMUSCULAR | Status: AC
  Administered 2020-06-17: 60 mg via INTRAMUSCULAR
  Filled 2020-06-17: qty 2
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
## 2020-06-17 NOTE — ED Notes (Signed)
Pt has been provided with discharge instructions. Pt denies any questions or concerns at this time. Pt verbalizes understanding for follow up care and d/c.  VSS.  Pt left department with all belongings.  

## 2020-06-25 ENCOUNTER — Other Ambulatory Visit: Payer: Self-pay

## 2020-06-25 ENCOUNTER — Ambulatory Visit: Payer: BC Managed Care – PPO | Admitting: Internal Medicine

## 2020-06-25 ENCOUNTER — Encounter: Payer: Self-pay | Admitting: Internal Medicine

## 2020-06-25 VITALS — BP 149/95 | HR 107 | Wt 257.0 lb

## 2020-06-25 DIAGNOSIS — M545 Low back pain, unspecified: Secondary | ICD-10-CM | POA: Diagnosis not present

## 2020-06-25 DIAGNOSIS — M542 Cervicalgia: Secondary | ICD-10-CM

## 2020-06-25 DIAGNOSIS — R519 Headache, unspecified: Secondary | ICD-10-CM | POA: Diagnosis not present

## 2020-06-25 MED ORDER — CYCLOBENZAPRINE HCL 10 MG PO TABS: 10.0000 mg | ORAL_TABLET | Freq: Three times a day (TID) | ORAL | 0 refills | Status: DC | PRN

## 2020-06-25 MED ORDER — NAPROXEN 500 MG PO TABS: 500.0000 mg | ORAL_TABLET | Freq: Two times a day (BID) | ORAL | 0 refills | Status: DC

## 2020-06-25 NOTE — Patient Instructions (Signed)
Neck Exercises Ask your health care provider which exercises are safe for you. Do exercises exactly as told by your health care provider and adjust them as directed. It is normal to feel mild stretching, pulling, tightness, or discomfort as you do these exercises. Stop right away if you feel sudden pain or your pain gets worse. Do not begin these exercises until told by your health care provider. Neck exercises can be important for many reasons. They can improve strength and maintain flexibility in your neck, which will help your upper back and prevent neck pain. Stretching exercises Rotation neck stretching 1. Sit in a chair or stand up. 2. Place your feet flat on the floor, shoulder width apart. 3. Slowly turn your head (rotate) to the right until a slight stretch is felt. Turn it all the way to the right so you can look over your right shoulder. Do not tilt or tip your head. 4. Hold this position for 10-30 seconds. 5. Slowly turn your head (rotate) to the left until a slight stretch is felt. Turn it all the way to the left so you can look over your left shoulder. Do not tilt or tip your head. 6. Hold this position for 10-30 seconds. Repeat __________ times. Complete this exercise __________ times a day.   Neck retraction 1. Sit in a sturdy chair or stand up. 2. Look straight ahead. Do not bend your neck. 3. Use your fingers to push your chin backward (retraction). Do not bend your neck for this movement. Continue to face straight ahead. If you are doing the exercise properly, you will feel a slight sensation in your throat and a stretch at the back of your neck. 4. Hold the stretch for 1-2 seconds. Repeat __________ times. Complete this exercise __________ times a day. Strengthening exercises Neck press 1. Lie on your back on a firm bed or on the floor with a pillow under your head. 2. Use your neck muscles to push your head down on the pillow and straighten your spine. 3. Hold the position  as well as you can. Keep your head facing up (in a neutral position) and your chin tucked. 4. Slowly count to 5 while holding this position. Repeat __________ times. Complete this exercise __________ times a day. Isometrics These are exercises in which you strengthen the muscles in your neck while keeping your neck still (isometrics). 1. Sit in a supportive chair and place your hand on your forehead. 2. Keep your head and face facing straight ahead. Do not flex or extend your neck while doing isometrics. 3. Push forward with your head and neck while pushing back with your hand. Hold for 10 seconds. 4. Do the sequence again, this time putting your hand against the back of your head. Use your head and neck to push backward against the hand pressure. 5. Finally, do the same exercise on either side of your head, pushing sideways against the pressure of your hand. Repeat __________ times. Complete this exercise __________ times a day. Prone head lifts 1. Lie face-down (prone position), resting on your elbows so that your chest and upper back are raised. 2. Start with your head facing downward, near your chest. Position your chin either on or near your chest. 3. Slowly lift your head upward. Lift until you are looking straight ahead. Then continue lifting your head as far back as you can comfortably stretch. 4. Hold your head up for 5 seconds. Then slowly lower it to your starting position. Repeat   __________ times. Complete this exercise __________ times a day. Supine head lifts 1. Lie on your back (supine position), bending your knees to point to the ceiling and keeping your feet flat on the floor. 2. Lift your head slowly off the floor, raising your chin toward your chest. 3. Hold for 5 seconds. Repeat __________ times. Complete this exercise __________ times a day. Scapular retraction 1. Stand with your arms at your sides. Look straight ahead. 2. Slowly pull both shoulders (scapulae) backward  and downward (retraction) until you feel a stretch between your shoulder blades in your upper back. 3. Hold for 10-30 seconds. 4. Relax and repeat. Repeat __________ times. Complete this exercise __________ times a day. Contact a health care provider if:  Your neck pain or discomfort gets much worse when you do an exercise.  Your neck pain or discomfort does not improve within 2 hours after you exercise. If you have any of these problems, stop exercising right away. Do not do the exercises again unless your health care provider says that you can. Get help right away if:  You develop sudden, severe neck pain. If this happens, stop exercising right away. Do not do the exercises again unless your health care provider says that you can. This information is not intended to replace advice given to you by your health care provider. Make sure you discuss any questions you have with your health care provider. Document Revised: 11/29/2017 Document Reviewed: 11/29/2017 Elsevier Patient Education  2021 [employer]vier Inc.  
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
## 2020-06-29 ENCOUNTER — Encounter: Payer: Self-pay | Admitting: Internal Medicine

## 2020-07-01 ENCOUNTER — Ambulatory Visit (INDEPENDENT_AMBULATORY_CARE_PROVIDER_SITE_OTHER): Payer: BC Managed Care – PPO | Admitting: Internal Medicine

## 2020-07-01 ENCOUNTER — Ambulatory Visit
Admission: RE | Admit: 2020-07-01 | Discharge: 2020-07-01 | Disposition: A | Discharge status: Home / Self Care | Payer: BC Managed Care – PPO | Source: Ambulatory Visit | Attending: Internal Medicine | Admitting: Internal Medicine

## 2020-07-01 ENCOUNTER — Other Ambulatory Visit: Payer: Self-pay

## 2020-07-01 ENCOUNTER — Ambulatory Visit
Admission: RE | Admit: 2020-07-01 | Discharge: 2020-07-01 | Disposition: A | Discharge status: Home / Self Care | Payer: BC Managed Care – PPO | Attending: Internal Medicine | Admitting: Internal Medicine

## 2020-07-01 ENCOUNTER — Encounter: Payer: Self-pay | Admitting: Internal Medicine

## 2020-07-01 VITALS — BP 134/82 | HR 104 | Temp 97.5°F | Resp 20 | Ht 70.0 in | Wt 257.0 lb

## 2020-07-01 DIAGNOSIS — M25511 Pain in right shoulder: Secondary | ICD-10-CM

## 2020-07-01 DIAGNOSIS — Z6836 Body mass index (BMI) 36.0-36.9, adult: Secondary | ICD-10-CM

## 2020-07-01 DIAGNOSIS — Z1159 Encounter for screening for other viral diseases: Secondary | ICD-10-CM | POA: Diagnosis not present

## 2020-07-01 DIAGNOSIS — I639 Cerebral infarction, unspecified: Secondary | ICD-10-CM

## 2020-07-01 DIAGNOSIS — I10 Essential (primary) hypertension: Secondary | ICD-10-CM | POA: Diagnosis not present

## 2020-07-01 DIAGNOSIS — I2510 Atherosclerotic heart disease of native coronary artery without angina pectoris: Secondary | ICD-10-CM | POA: Diagnosis not present

## 2020-07-01 DIAGNOSIS — E782 Mixed hyperlipidemia: Secondary | ICD-10-CM | POA: Diagnosis not present

## 2020-07-01 DIAGNOSIS — Z0001 Encounter for general adult medical examination with abnormal findings: Secondary | ICD-10-CM | POA: Diagnosis not present

## 2020-07-01 DIAGNOSIS — E1169 Type 2 diabetes mellitus with other specified complication: Secondary | ICD-10-CM

## 2020-07-01 DIAGNOSIS — E785 Hyperlipidemia, unspecified: Secondary | ICD-10-CM

## 2020-07-01 DIAGNOSIS — I213 ST elevation (STEMI) myocardial infarction of unspecified site: Secondary | ICD-10-CM

## 2020-07-01 DIAGNOSIS — E66812 Obesity, class 2: Secondary | ICD-10-CM

## 2020-07-01 DIAGNOSIS — M542 Cervicalgia: Secondary | ICD-10-CM

## 2020-07-01 NOTE — Assessment & Plan Note (Signed)
Controlled on Losartan °Reinforced DASH diet and exercise for weight loss °CMET today °Will monitor °

## 2020-07-01 NOTE — Assessment & Plan Note (Signed)
A1C today No urine microalbumin secondary to ACEI therapy Encouraged him to consume a low carb diet and exercise for weight loss Encouraged routine eye exams Encouraged routine foot exams Continue Metformin, Januvia and Lantus Flu and pneumovax UTD Encouraged him to get his Covid vaccine

## 2020-07-01 NOTE — Assessment & Plan Note (Signed)
No residual effect Continue Losartan, Atorvastatin, ASA, Metformin, Januvia and Lantus CMET, Lipid and A1C today Encouraged low fat, low carb diet and exercise for weight loss

## 2020-07-01 NOTE — Assessment & Plan Note (Signed)
No angina Continue Atorvastatin, Losartan and ASA Not on beta blocker despite tachycardia, will defer to cardiology CMET and lipid profile today
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
## 2020-07-01 NOTE — Patient Instructions (Signed)

## 2020-07-01 NOTE — Assessment & Plan Note (Addendum)
No angina Continue Atorvastatin, Losartan and ASA Tachycardic but not on beta blocker, will defer to cardiology CMET and lipid profile today

## 2020-07-01 NOTE — Assessment & Plan Note (Signed)
CMET and lipid profile today °Encouraged him to consume a low fat diet °Continue Atorvastatin °

## 2020-07-02 LAB — CBC
HCT: 48 % (ref 38.5–50.0)
Hemoglobin: 16.1 g/dL (ref 13.2–17.1)
MCH: 32.1 pg (ref 27.0–33.0)
MCHC: 33.5 g/dL (ref 32.0–36.0)
MCV: 95.8 fL (ref 80.0–100.0)
MPV: 10.9 fL (ref 7.5–12.5)
Platelets: 215 10*3/uL (ref 140–400)
RBC: 5.01 10*6/uL (ref 4.20–5.80)
RDW: 13.1 % (ref 11.0–15.0)
WBC: 13.2 10*3/uL — ABNORMAL HIGH (ref 3.8–10.8)

## 2020-07-02 LAB — LIPID PANEL
Cholesterol: 178 mg/dL (ref <200)
HDL: 29 mg/dL — ABNORMAL LOW (ref >=40)
LDL Cholesterol (Calc): 105 mg/dL (calc) — ABNORMAL HIGH
Non-HDL Cholesterol (Calc): 149 mg/dL (calc) — ABNORMAL HIGH (ref <130)
Total CHOL/HDL Ratio: 6.1 (calc) — ABNORMAL HIGH (ref <5.0)
Triglycerides: 329 mg/dL — ABNORMAL HIGH (ref <150)

## 2020-07-02 LAB — COMPREHENSIVE METABOLIC PANEL
AG Ratio: 1.5 (calc) (ref 1.0–2.5)
ALT: 30 U/L (ref 9–46)
AST: 17 U/L (ref 10–40)
Albumin: 4 g/dL (ref 3.6–5.1)
Alkaline phosphatase (APISO): 101 U/L (ref 36–130)
BUN: 15 mg/dL (ref 7–25)
CO2: 22 mmol/L (ref 20–32)
Calcium: 9.3 mg/dL (ref 8.6–10.3)
Chloride: 103 mmol/L (ref 98–110)
Creat: 0.94 mg/dL (ref 0.60–1.35)
Globulin: 2.7 g/dL (calc) (ref 1.9–3.7)
Glucose, Bld: 210 mg/dL — ABNORMAL HIGH (ref 65–99)
Potassium: 4.4 mmol/L (ref 3.5–5.3)
Sodium: 138 mmol/L (ref 135–146)
Total Bilirubin: 0.3 mg/dL (ref 0.2–1.2)
Total Protein: 6.7 g/dL (ref 6.1–8.1)

## 2020-07-02 LAB — HEPATITIS C ANTIBODY
Hepatitis C Ab: NONREACTIVE
SIGNAL TO CUT-OFF: 0.01 (ref <1.00)

## 2020-07-02 LAB — HEMOGLOBIN A1C
Hgb A1c MFr Bld: 9.1 % of total Hgb — ABNORMAL HIGH (ref <5.7)
Mean Plasma Glucose: 214 mg/dL
eAG (mmol/L): 11.9 mmol/L

## 2020-07-02 LAB — TSH: TSH: 1.53 mIU/L (ref 0.40–4.50)

## 2020-07-03 ENCOUNTER — Telehealth: Payer: Self-pay

## 2020-07-03 NOTE — Telephone Encounter (Signed)
Copied from CRM [id number]. Topic: General - Other >> Jul 02, 2020 11:06 AM Tamela Oddi wrote: Reason for CRM: Patient called to inform the doctor that she is going to go on short term disability.  He stated that he will need a doctor note asap.  Please let patient know if he needs to schedule another appt. With doctor to get this letter.  CB# [phone]

## 2020-07-06 NOTE — Telephone Encounter (Signed)
A note will not suffice. I need the short term disability paperwork to fill out

## 2020-07-07 NOTE — Telephone Encounter (Addendum)
As per patient forms were faxed twice on 07/03/2018,  once on 07/03/2020 and then today 07/07/2020 to fax # [phone].

## 2020-07-07 NOTE — Telephone Encounter (Signed)
The pt is working on getting the paperwork from his HR department.

## 2020-07-15 ENCOUNTER — Encounter: Payer: Self-pay | Admitting: Physical Therapy

## 2020-07-15 ENCOUNTER — Ambulatory Visit: Payer: BC Managed Care – PPO | Attending: Internal Medicine | Admitting: Physical Therapy

## 2020-07-15 ENCOUNTER — Other Ambulatory Visit: Payer: Self-pay

## 2020-07-15 DIAGNOSIS — M25511 Pain in right shoulder: Secondary | ICD-10-CM | POA: Diagnosis not present

## 2020-07-15 DIAGNOSIS — M542 Cervicalgia: Secondary | ICD-10-CM | POA: Diagnosis not present

## 2020-07-15 DIAGNOSIS — M6281 Muscle weakness (generalized): Secondary | ICD-10-CM | POA: Diagnosis not present
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
## 2020-07-21 ENCOUNTER — Encounter: Payer: Self-pay | Admitting: Physical Therapy

## 2020-07-21 ENCOUNTER — Ambulatory Visit: Payer: BC Managed Care – PPO | Admitting: Physical Therapy

## 2020-07-21 ENCOUNTER — Other Ambulatory Visit: Payer: Self-pay

## 2020-07-21 DIAGNOSIS — M25511 Pain in right shoulder: Secondary | ICD-10-CM

## 2020-07-21 DIAGNOSIS — M542 Cervicalgia: Secondary | ICD-10-CM

## 2020-07-21 DIAGNOSIS — M6281 Muscle weakness (generalized): Secondary | ICD-10-CM
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
## 2020-07-23 ENCOUNTER — Other Ambulatory Visit: Payer: Self-pay

## 2020-07-23 ENCOUNTER — Ambulatory Visit: Payer: BC Managed Care – PPO | Admitting: Physical Therapy

## 2020-07-23 DIAGNOSIS — M542 Cervicalgia: Secondary | ICD-10-CM

## 2020-07-23 DIAGNOSIS — M25511 Pain in right shoulder: Secondary | ICD-10-CM

## 2020-07-23 DIAGNOSIS — M6281 Muscle weakness (generalized): Secondary | ICD-10-CM | POA: Diagnosis not present
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
## 2020-07-28 ENCOUNTER — Encounter: Payer: Self-pay | Admitting: Physical Therapy

## 2020-07-28 ENCOUNTER — Ambulatory Visit: Payer: BC Managed Care – PPO | Admitting: Physical Therapy

## 2020-07-28 ENCOUNTER — Other Ambulatory Visit: Payer: Self-pay

## 2020-07-28 DIAGNOSIS — M25511 Pain in right shoulder: Secondary | ICD-10-CM | POA: Diagnosis not present

## 2020-07-28 DIAGNOSIS — M6281 Muscle weakness (generalized): Secondary | ICD-10-CM | POA: Diagnosis not present

## 2020-07-28 DIAGNOSIS — M542 Cervicalgia: Secondary | ICD-10-CM
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
## 2020-07-30 ENCOUNTER — Other Ambulatory Visit: Payer: Self-pay

## 2020-07-30 ENCOUNTER — Ambulatory Visit: Payer: BC Managed Care – PPO | Admitting: Physical Therapy

## 2020-07-30 DIAGNOSIS — M542 Cervicalgia: Secondary | ICD-10-CM | POA: Diagnosis not present

## 2020-07-30 DIAGNOSIS — M6281 Muscle weakness (generalized): Secondary | ICD-10-CM

## 2020-07-30 DIAGNOSIS — M25511 Pain in right shoulder: Secondary | ICD-10-CM
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
## 2020-07-31 ENCOUNTER — Encounter: Payer: Self-pay | Admitting: Physical Therapy

## 2020-08-04 ENCOUNTER — Ambulatory Visit: Payer: BC Managed Care – PPO | Admitting: Physical Therapy

## 2020-08-04 ENCOUNTER — Other Ambulatory Visit: Payer: Self-pay

## 2020-08-04 DIAGNOSIS — M542 Cervicalgia: Secondary | ICD-10-CM

## 2020-08-04 DIAGNOSIS — M6281 Muscle weakness (generalized): Secondary | ICD-10-CM

## 2020-08-04 DIAGNOSIS — M25511 Pain in right shoulder: Secondary | ICD-10-CM
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
## 2020-08-05 ENCOUNTER — Encounter: Payer: Self-pay | Admitting: Physical Therapy

## 2020-08-06 ENCOUNTER — Other Ambulatory Visit: Payer: Self-pay

## 2020-08-06 ENCOUNTER — Ambulatory Visit: Payer: BC Managed Care – PPO | Admitting: Physical Therapy

## 2020-08-06 DIAGNOSIS — M25511 Pain in right shoulder: Secondary | ICD-10-CM | POA: Diagnosis not present

## 2020-08-06 DIAGNOSIS — M542 Cervicalgia: Secondary | ICD-10-CM | POA: Diagnosis not present

## 2020-08-06 DIAGNOSIS — M6281 Muscle weakness (generalized): Secondary | ICD-10-CM
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
## 2020-08-07 ENCOUNTER — Encounter: Payer: Self-pay | Admitting: Physical Therapy

## 2020-08-11 ENCOUNTER — Ambulatory Visit: Payer: BC Managed Care – PPO | Admitting: Physical Therapy

## 2020-08-12 ENCOUNTER — Ambulatory Visit: Payer: BC Managed Care – PPO | Admitting: Physical Therapy

## 2020-08-12 ENCOUNTER — Other Ambulatory Visit: Payer: Self-pay

## 2020-08-12 DIAGNOSIS — M6281 Muscle weakness (generalized): Secondary | ICD-10-CM

## 2020-08-12 DIAGNOSIS — M542 Cervicalgia: Secondary | ICD-10-CM

## 2020-08-12 DIAGNOSIS — M25511 Pain in right shoulder: Secondary | ICD-10-CM
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
## 2020-08-13 ENCOUNTER — Encounter: Payer: Self-pay | Admitting: Physical Therapy

## 2020-08-13 ENCOUNTER — Ambulatory Visit: Payer: BC Managed Care – PPO | Admitting: Physical Therapy

## 2020-08-13 DIAGNOSIS — M25511 Pain in right shoulder: Secondary | ICD-10-CM | POA: Diagnosis not present

## 2020-08-13 DIAGNOSIS — M6281 Muscle weakness (generalized): Secondary | ICD-10-CM | POA: Diagnosis not present

## 2020-08-13 DIAGNOSIS — M542 Cervicalgia: Secondary | ICD-10-CM | POA: Diagnosis not present
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
## 2020-08-18 ENCOUNTER — Ambulatory Visit: Payer: BC Managed Care – PPO | Attending: Internal Medicine | Admitting: Physical Therapy

## 2020-08-18 ENCOUNTER — Encounter: Payer: Self-pay | Admitting: Physical Therapy

## 2020-08-18 ENCOUNTER — Other Ambulatory Visit: Payer: Self-pay

## 2020-08-18 DIAGNOSIS — M6281 Muscle weakness (generalized): Secondary | ICD-10-CM | POA: Diagnosis not present

## 2020-08-18 DIAGNOSIS — M542 Cervicalgia: Secondary | ICD-10-CM | POA: Insufficient documentation

## 2020-08-18 DIAGNOSIS — M25511 Pain in right shoulder: Secondary | ICD-10-CM | POA: Diagnosis not present
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
## 2020-08-20 ENCOUNTER — Encounter: Payer: Self-pay | Admitting: Physical Therapy

## 2020-08-20 ENCOUNTER — Ambulatory Visit: Payer: BC Managed Care – PPO | Admitting: Physical Therapy

## 2020-08-25 ENCOUNTER — Encounter: Payer: BC Managed Care – PPO | Admitting: Physical Therapy

## 2020-08-26 ENCOUNTER — Ambulatory Visit: Payer: Self-pay | Admitting: *Deleted

## 2020-08-26 ENCOUNTER — Other Ambulatory Visit: Payer: Self-pay

## 2020-08-26 ENCOUNTER — Observation Stay: Payer: BC Managed Care – PPO

## 2020-08-26 ENCOUNTER — Ambulatory Visit: Payer: BC Managed Care – PPO | Admitting: Physical Therapy

## 2020-08-26 ENCOUNTER — Encounter: Payer: Self-pay | Admitting: Physical Therapy

## 2020-08-26 ENCOUNTER — Emergency Department: Payer: BC Managed Care – PPO

## 2020-08-26 ENCOUNTER — Inpatient Hospital Stay
Admission: EM | Admit: 2020-08-26 | Discharge: 2020-08-28 | DRG: 064 | Disposition: A | Discharge status: Home / Self Care | Payer: BC Managed Care – PPO | Attending: Internal Medicine | Admitting: Internal Medicine

## 2020-08-26 DIAGNOSIS — Z809 Family history of malignant neoplasm, unspecified: Secondary | ICD-10-CM | POA: Diagnosis not present

## 2020-08-26 DIAGNOSIS — I213 ST elevation (STEMI) myocardial infarction of unspecified site: Secondary | ICD-10-CM | POA: Diagnosis present

## 2020-08-26 DIAGNOSIS — Z794 Long term (current) use of insulin: Secondary | ICD-10-CM

## 2020-08-26 DIAGNOSIS — F32A Depression, unspecified: Secondary | ICD-10-CM | POA: Diagnosis not present

## 2020-08-26 DIAGNOSIS — I10 Essential (primary) hypertension: Secondary | ICD-10-CM | POA: Diagnosis present

## 2020-08-26 DIAGNOSIS — I252 Old myocardial infarction: Secondary | ICD-10-CM

## 2020-08-26 DIAGNOSIS — G8194 Hemiplegia, unspecified affecting left nondominant side: Secondary | ICD-10-CM | POA: Diagnosis present

## 2020-08-26 DIAGNOSIS — R29818 Other symptoms and signs involving the nervous system: Secondary | ICD-10-CM | POA: Diagnosis not present

## 2020-08-26 DIAGNOSIS — K219 Gastro-esophageal reflux disease without esophagitis: Secondary | ICD-10-CM | POA: Diagnosis present

## 2020-08-26 DIAGNOSIS — Z823 Family history of stroke: Secondary | ICD-10-CM | POA: Diagnosis not present

## 2020-08-26 DIAGNOSIS — E1169 Type 2 diabetes mellitus with other specified complication: Secondary | ICD-10-CM | POA: Diagnosis not present

## 2020-08-26 DIAGNOSIS — I6381 Other cerebral infarction due to occlusion or stenosis of small artery: Principal | ICD-10-CM | POA: Diagnosis present

## 2020-08-26 DIAGNOSIS — I2510 Atherosclerotic heart disease of native coronary artery without angina pectoris: Secondary | ICD-10-CM | POA: Diagnosis not present

## 2020-08-26 DIAGNOSIS — Z955 Presence of coronary angioplasty implant and graft: Secondary | ICD-10-CM

## 2020-08-26 DIAGNOSIS — Z7982 Long term (current) use of aspirin: Secondary | ICD-10-CM

## 2020-08-26 DIAGNOSIS — Z7901 Long term (current) use of anticoagulants: Secondary | ICD-10-CM | POA: Diagnosis not present

## 2020-08-26 DIAGNOSIS — M25511 Pain in right shoulder: Secondary | ICD-10-CM

## 2020-08-26 DIAGNOSIS — Z5320 Procedure and treatment not carried out because of patient's decision for unspecified reasons: Secondary | ICD-10-CM | POA: Diagnosis not present

## 2020-08-26 DIAGNOSIS — F17210 Nicotine dependence, cigarettes, uncomplicated: Secondary | ICD-10-CM | POA: Diagnosis not present

## 2020-08-26 DIAGNOSIS — Z7902 Long term (current) use of antithrombotics/antiplatelets: Secondary | ICD-10-CM

## 2020-08-26 DIAGNOSIS — Z8261 Family history of arthritis: Secondary | ICD-10-CM | POA: Diagnosis not present

## 2020-08-26 DIAGNOSIS — D6852 Prothrombin gene mutation: Secondary | ICD-10-CM

## 2020-08-26 DIAGNOSIS — R2681 Unsteadiness on feet: Secondary | ICD-10-CM | POA: Diagnosis present

## 2020-08-26 DIAGNOSIS — F1411 Cocaine abuse, in remission: Secondary | ICD-10-CM | POA: Diagnosis not present

## 2020-08-26 DIAGNOSIS — Z8673 Personal history of transient ischemic attack (TIA), and cerebral infarction without residual deficits: Secondary | ICD-10-CM

## 2020-08-26 DIAGNOSIS — Z833 Family history of diabetes mellitus: Secondary | ICD-10-CM

## 2020-08-26 DIAGNOSIS — E1165 Type 2 diabetes mellitus with hyperglycemia: Secondary | ICD-10-CM | POA: Diagnosis not present

## 2020-08-26 DIAGNOSIS — Z79899 Other long term (current) drug therapy: Secondary | ICD-10-CM | POA: Diagnosis not present

## 2020-08-26 DIAGNOSIS — F1210 Cannabis abuse, uncomplicated: Secondary | ICD-10-CM | POA: Diagnosis present

## 2020-08-26 DIAGNOSIS — M6281 Muscle weakness (generalized): Secondary | ICD-10-CM

## 2020-08-26 DIAGNOSIS — I6522 Occlusion and stenosis of left carotid artery: Secondary | ICD-10-CM | POA: Diagnosis not present

## 2020-08-26 DIAGNOSIS — I6523 Occlusion and stenosis of bilateral carotid arteries: Secondary | ICD-10-CM | POA: Diagnosis not present

## 2020-08-26 DIAGNOSIS — Z83438 Family history of other disorder of lipoprotein metabolism and other lipidemia: Secondary | ICD-10-CM

## 2020-08-26 DIAGNOSIS — E785 Hyperlipidemia, unspecified: Secondary | ICD-10-CM | POA: Diagnosis present

## 2020-08-26 DIAGNOSIS — U071 COVID-19: Secondary | ICD-10-CM | POA: Diagnosis not present

## 2020-08-26 DIAGNOSIS — R4701 Aphasia: Secondary | ICD-10-CM | POA: Diagnosis present

## 2020-08-26 DIAGNOSIS — Z820 Family history of epilepsy and other diseases of the nervous system: Secondary | ICD-10-CM | POA: Diagnosis not present

## 2020-08-26 DIAGNOSIS — S0003XA Contusion of scalp, initial encounter: Secondary | ICD-10-CM | POA: Diagnosis not present

## 2020-08-26 DIAGNOSIS — E7800 Pure hypercholesterolemia, unspecified: Secondary | ICD-10-CM | POA: Diagnosis present

## 2020-08-26 DIAGNOSIS — R4781 Slurred speech: Secondary | ICD-10-CM | POA: Diagnosis not present

## 2020-08-26 DIAGNOSIS — Z8616 Personal history of COVID-19: Secondary | ICD-10-CM | POA: Diagnosis not present

## 2020-08-26 DIAGNOSIS — R590 Localized enlarged lymph nodes: Secondary | ICD-10-CM | POA: Diagnosis not present

## 2020-08-26 DIAGNOSIS — I639 Cerebral infarction, unspecified: Secondary | ICD-10-CM | POA: Diagnosis not present

## 2020-08-26 DIAGNOSIS — R42 Dizziness and giddiness: Secondary | ICD-10-CM | POA: Diagnosis not present

## 2020-08-26 DIAGNOSIS — F419 Anxiety disorder, unspecified: Secondary | ICD-10-CM | POA: Diagnosis present

## 2020-08-26 DIAGNOSIS — R29810 Facial weakness: Secondary | ICD-10-CM | POA: Diagnosis present

## 2020-08-26 DIAGNOSIS — M542 Cervicalgia: Secondary | ICD-10-CM

## 2020-08-26 HISTORY — DX: Cerebral infarction, unspecified: I63.9

## 2020-08-26 LAB — DIFFERENTIAL
Abs Immature Granulocytes: 0.06 10*3/uL (ref 0.00–0.07)
Basophils Absolute: 0.1 10*3/uL (ref 0.0–0.1)
Basophils Relative: 1 %
Eosinophils Absolute: 0.3 10*3/uL (ref 0.0–0.5)
Eosinophils Relative: 2 %
Immature Granulocytes: 0 %
Lymphocytes Relative: 27 %
Lymphs Abs: 4 10*3/uL (ref 0.7–4.0)
Monocytes Absolute: 0.9 10*3/uL (ref 0.1–1.0)
Monocytes Relative: 6 %
Neutro Abs: 9.6 10*3/uL — ABNORMAL HIGH (ref 1.7–7.7)
Neutrophils Relative %: 64 %

## 2020-08-26 LAB — COMPREHENSIVE METABOLIC PANEL
ALT: 58 U/L — ABNORMAL HIGH (ref 0–44)
AST: 29 U/L (ref 15–41)
Albumin: 3.7 g/dL (ref 3.5–5.0)
Alkaline Phosphatase: 102 U/L (ref 38–126)
Anion gap: 7 (ref 5–15)
BUN: 13 mg/dL (ref 6–20)
CO2: 25 mmol/L (ref 22–32)
Calcium: 9.1 mg/dL (ref 8.9–10.3)
Chloride: 102 mmol/L (ref 98–111)
Creatinine, Ser: 0.92 mg/dL (ref 0.61–1.24)
GFR, Estimated: >60 mL/min (ref >60)
Glucose, Bld: 396 mg/dL — ABNORMAL HIGH (ref 70–99)
Potassium: 4.3 mmol/L (ref 3.5–5.1)
Sodium: 134 mmol/L — ABNORMAL LOW (ref 135–145)
Total Bilirubin: 0.6 mg/dL (ref 0.3–1.2)
Total Protein: 7.3 g/dL (ref 6.5–8.1)

## 2020-08-26 LAB — APTT: aPTT: 26 seconds (ref 24–36)

## 2020-08-26 LAB — CBC
HCT: 45.3 % (ref 39.0–52.0)
Hemoglobin: 16 g/dL (ref 13.0–17.0)
MCH: 32.7 pg (ref 26.0–34.0)
MCHC: 35.3 g/dL (ref 30.0–36.0)
MCV: 92.6 fL (ref 80.0–100.0)
Platelets: 237 10*3/uL (ref 150–400)
RBC: 4.89 MIL/uL (ref 4.22–5.81)
RDW: 12.9 % (ref 11.5–15.5)
WBC: 14.9 10*3/uL — ABNORMAL HIGH (ref 4.0–10.5)
nRBC: 0 % (ref 0.0–0.2)

## 2020-08-26 LAB — CBG MONITORING, ED: Glucose-Capillary: 375 mg/dL — ABNORMAL HIGH (ref 70–99)

## 2020-08-26 LAB — PROTIME-INR
INR: 0.9 (ref 0.8–1.2)
Prothrombin Time: 12.2 seconds (ref 11.4–15.2)

## 2020-08-26 MED ORDER — ASPIRIN 81 MG PO CHEW
243.0000 mg | CHEWABLE_TABLET | Freq: Once | ORAL | Status: AC
  Administered 2020-08-26: 243 mg via ORAL
  Filled 2020-08-26: qty 3

## 2020-08-26 MED ORDER — NICOTINE 21 MG/24HR TD PT24
21.0000 mg | MEDICATED_PATCH | Freq: Once | TRANSDERMAL | Status: AC
  Administered 2020-08-26: 21 mg via TRANSDERMAL
  Filled 2020-08-26: qty 1

## 2020-08-26 MED ORDER — ENOXAPARIN SODIUM 40 MG/0.4ML IJ SOSY: 40.0000 mg | PREFILLED_SYRINGE | INTRAMUSCULAR | Status: DC

## 2020-08-26 MED ORDER — ACETAMINOPHEN 160 MG/5ML PO SOLN
650.0000 mg | ORAL | Status: DC | PRN
  Filled 2020-08-26: qty 20.3

## 2020-08-26 MED ORDER — INSULIN GLARGINE 100 UNIT/ML ~~LOC~~ SOLN
20.0000 [IU] | Freq: Every day | SUBCUTANEOUS | Status: DC
  Administered 2020-08-26: 20 [IU] via SUBCUTANEOUS
  Filled 2020-08-26 (×3): qty 0.2

## 2020-08-26 MED ORDER — INSULIN ASPART 100 UNIT/ML IJ SOLN
0.0000 [IU] | Freq: Every day | INTRAMUSCULAR | Status: DC
  Administered 2020-08-26: 2 [IU] via SUBCUTANEOUS
  Filled 2020-08-26: qty 1

## 2020-08-26 MED ORDER — STROKE: EARLY STAGES OF RECOVERY BOOK: Freq: Once | Status: DC

## 2020-08-26 MED ORDER — ACETAMINOPHEN 325 MG PO TABS: 650.0000 mg | ORAL_TABLET | ORAL | Status: DC | PRN

## 2020-08-26 MED ORDER — SODIUM CHLORIDE 0.9% FLUSH: 3.0000 mL | Freq: Once | INTRAVENOUS | Status: DC

## 2020-08-26 MED ORDER — ATORVASTATIN CALCIUM 80 MG PO TABS
80.0000 mg | ORAL_TABLET | Freq: Every day | ORAL | Status: DC
  Administered 2020-08-27 – 2020-08-28 (×2): 80 mg via ORAL
  Filled 2020-08-26: qty 4
  Filled 2020-08-26: qty 1

## 2020-08-26 MED ORDER — ACETAMINOPHEN 650 MG RE SUPP: 650.0000 mg | RECTAL | Status: DC | PRN

## 2020-08-26 MED ORDER — ASPIRIN EC 81 MG PO TBEC
81.0000 mg | DELAYED_RELEASE_TABLET | Freq: Every day | ORAL | Status: DC
  Administered 2020-08-27 – 2020-08-28 (×2): 81 mg via ORAL
  Filled 2020-08-26 (×2): qty 1

## 2020-08-26 MED ORDER — SENNOSIDES-DOCUSATE SODIUM 8.6-50 MG PO TABS: 1.0000 | ORAL_TABLET | Freq: Every evening | ORAL | Status: DC | PRN

## 2020-08-26 MED ORDER — CLOPIDOGREL BISULFATE 75 MG PO TABS
75.0000 mg | ORAL_TABLET | Freq: Every day | ORAL | Status: DC
  Administered 2020-08-27 – 2020-08-28 (×2): 75 mg via ORAL
  Filled 2020-08-26 (×2): qty 1

## 2020-08-26 MED ORDER — INSULIN ASPART 100 UNIT/ML IJ SOLN
0.0000 [IU] | Freq: Three times a day (TID) | INTRAMUSCULAR | Status: DC
  Administered 2020-08-27: 5 [IU] via SUBCUTANEOUS
  Administered 2020-08-27: 8 [IU] via SUBCUTANEOUS
  Administered 2020-08-28 (×2): 5 [IU] via SUBCUTANEOUS
  Filled 2020-08-26 (×4): qty 1

## 2020-08-26 NOTE — Telephone Encounter (Signed)
Patient's GF calls regarding [place]. She has noticed certain changes with him including his movement is very slow and deliberate, speech sounds slow, when he stands up he falls back to the sofa. He sleeps most of the day.She believes he has had another stroke (had one last year). He argues he has not had a stroke. Began noticing these changes 3-4 days ago.Was diagnosed with Covid last week-test yesterday read negative for Covid.Reason for Disposition  [1] Loss of speech or garbled speech AND [2] sudden onset AND [3] brief (now gone)  Answer Assessment - Initial Assessment Questions 1. SYMPTOM: "What is the main symptom you are concerned about?" (e.g., weakness, numbness)     Patient's partner sees differences with him, slow movements, leaning, confusion 2. ONSET: "When did this start?" (minutes, hours, days; while sleeping)     3-4 days ago 3. LAST NORMAL: "When was the last time you (the patient) were normal (no symptoms)?"     5 days ago 4. PATTERN "Does this come and go, or has it been constant since it started?"  "Is it present now?"     constant 5. CARDIAC SYMPTOMS: "Have you had any of the following symptoms: chest pain, difficulty breathing, palpitations?"     none 6. NEUROLOGIC SYMPTOMS: "Have you had any of the following symptoms: headache, dizziness, vision loss, double vision, changes in speech, unsteady on your feet?"     Unsteady on feet, dizziness 7. OTHER SYMPTOMS: "Do you have any other symptoms?"     Slow to respond  8. PREGNANCY: "Is there any chance you are pregnant?" "When was your last menstrual period?"     na  Protocols used: Neurologic Deficit-A-AH

## 2020-08-26 NOTE — H&P (Signed)
History and Physical   QUENTEZ LOBER HRC:[id number] DOB: 25-Jan-1981 DOA: 08/26/2020  PCP: [name], NP   Patient coming from: Home  Chief Complaint: Dizziness, weakness, concern for stroke  HPI: Martin Mullen is a 40 y.o. male with medical history significant of adjustment sorter, anxiety, depression, CAD, CVA, hyperlipidemia, diabetes, hypertension, GERD who presents with some neurologic deficits and concern for possible stroke. Patient has history of prior CVA as above and has had some deficits in the past 5 days and there is some concern he may be having another stroke.  Patient has had about a week of dizziness and lightheadedness as well as bilateral leg weakness.  His girlfriend reported that she had noticed slurred speech in the last week however he is unsure about this.  He denies any blurry vision or headache.  He also reports some numbness and tingling of his right upper extremity. He denies fevers, chills, chest pain, shortness of breath, abdominal pain, constipation, diarrhea, nausea, vomiting.  Girlfriend reports symptoms for 5 days similar to previous stroke. Went to PT today and Therapist there confirmed new deficit. She also reports he tested positive for Covid 6 days ago.  ED Course: Vital signs in the ED significant for blood pressure in the 680H to 212Y systolic.  Lab work-up showed CMP with sodium 134, glucose 396, ALT 58.  CBC showed leukocytosis to 14.9 though he does appear degree of chronic leukocytosis.  PT, PTT, INR within normal notes.  Respiratory panel flu and COVID-negative.  CT head showed acute or subacute infarcts new from previous CT in May.  Patient received dose of aspirin in the ED.  Review of Systems: As per HPI otherwise all other systems reviewed and are negative.  Past Medical History:  Diagnosis Date   Acute transmural inferior wall MI ([place]) 08/28/2012   .5 x 12 mm Veri-flex stent non-DES.   Chicken pox    Coronary artery disease     Inferior ST elevation myocardial infarction in July of 2014. Cardiac catheterization showed 95% distal RCA stenosis and 90% first diagonal stenosis with normal ejection fraction. He underwent PCI in bare-metal stent placement to the distal RCA.   Diabetes mellitus without complication ([place])    Hypercholesterolemia    Stroke ([place])    Tobacco use     Past Surgical History:  Procedure Laterality Date   CORONARY ANGIOPLASTY WITH STENT PLACEMENT     DEBRIDEMENT AND CLOSURE WOUND N/A 09/03/2019   Procedure: Debridement and closure of scrotal wound;  Surgeon: Cindra Presume, MD;  Location: [place];  Service: Plastics;  Laterality: N/A;   DEBRIDEMENT AND CLOSURE WOUND N/A 07/25/2019   Procedure: DEBRIDEMENT AND CLOSURE WOUND;  Surgeon: Cindra Presume, MD;  Location: [facility];  Service: Plastics;  Laterality: N/A;   INCISION AND DRAINAGE ABSCESS N/A 07/20/2019   Procedure: INCISION AND DRAINAGE ABSCESS;  Surgeon: Lucas Mallow, MD;  Location: [facility];  Service: Urology;  Laterality: N/A;   LEFT HEART CATHETERIZATION WITH CORONARY ANGIOGRAM N/A 08/28/2012   Procedure: LEFT HEART CATHETERIZATION WITH CORONARY ANGIOGRAM;  Surgeon: Laverda Page, MD;  Location: [facility];  Service: Cardiovascular;  Laterality: N/A;   WRIST SURGERY  1988    Social History  reports that he has been smoking cigarettes. He has a 30.00 pack-year smoking history. He has never used smokeless tobacco. He reports previous alcohol use. He reports current drug use. Drug: Marijuana.  No Known Allergies  Family History  Problem Relation Age of Onset   Arthritis Mother        Rheumatoid   Diabetes Mother    Hyperlipidemia Mother    Diabetes Father    Cancer Maternal Grandfather        Prostate   Parkinson's disease Maternal Grandfather   Reviewed on admission  Prior to Admission medications   Medication Sig Start Date End Date Taking? Authorizing Provider  aspirin 81 MG EC tablet Take 1 tablet  (81 mg total) by mouth daily. 09/05/12   Rai, Vernelle Emerald, MD  atorvastatin (LIPITOR) 80 MG tablet Take 80 mg by mouth daily. Patient not taking: Reported on 07/01/2020 11/27/19   [provider]  Blood Glucose Monitoring Suppl (BAYER CONTOUR NEXT MONITOR) w/Device KIT 1 Device 07/22/15   [name], NP  CONTOUR NEXT TEST test strip USE 1 STRIP TO CHECK GLUCOSE THREE TIMES DAILY AS NEEDED 09/05/18   [name], NP  cyclobenzaprine (FLEXERIL) 10 MG tablet Take 1 tablet (10 mg total) by mouth 3 (three) times daily as needed for muscle spasms. 06/25/20   [name], NP  insulin glargine (LANTUS SOLOSTAR) 100 UNIT/ML Solostar Pen INJECT 23 UNITS UNDER THE SKIN EVERY DAY 04/30/20   [name], NP  Insulin Pen Needle 31G X 8 MM MISC 1 each by Does not apply route daily. 11/09/18   [name], NP  losartan (COZAAR) 50 MG tablet Take 1 tablet (50 mg total) by mouth daily. 06/11/19   [name], NP  metFORMIN (GLUCOPHAGE) 1000 MG tablet TAKE 1 TABLET(1000 MG) BY MOUTH TWICE DAILY WITH A MEAL 06/18/20   [name], [name], NP  naproxen (NAPROSYN) 500 MG tablet Take 1 tablet (500 mg total) by mouth 2 (two) times daily with a meal. Patient not taking: Reported on 07/15/2020 06/25/20   [name], NP  nitroGLYCERIN (NITROSTAT) 0.4 MG SL tablet DISSOLVE ONE TABLET UNDER THE TONGUE EVERY 5 MINUTES AS NEEDED FOR CHEST PAIN.  DO NOT EXCEED A TOTAL OF 3 DOSES IN 15 MINUTES Patient taking differently: DISSOLVE ONE TABLET UNDER THE TONGUE EVERY 5 MINUTES AS NEEDED FOR CHEST PAIN.  DO NOT EXCEED A TOTAL OF 3 DOSES IN 15 MINUTES 11/13/19   [name], NP  sitaGLIPtin (JANUVIA) 100 MG tablet TAKE 1 TABLET(100 MG) BY MOUTH DAILY 06/18/20   [name], NP    Physical Exam: Vitals:   08/26/20 1645 08/26/20 2032 08/26/20 2130 08/26/20 2145  BP: (!) 170/115 (!) 158/100 (!) 166/108 (!) 161/106  Pulse: 90 88 90 86  Resp: _0 Temp: 98.2 F (36.8 C)     SpO2: 97% 97% 98% 97%    Physical Exam Constitutional:      General: He is not in acute distress.    Appearance: Normal appearance.  HENT:     Head: Normocephalic and atraumatic.     Mouth/Throat:     Mouth: Mucous membranes are moist.     Pharynx: Oropharynx is clear.  Eyes:     Extraocular Movements: Extraocular movements intact.     Pupils: Pupils are equal, round, and reactive to light.  Cardiovascular:     Rate and Rhythm: Normal rate and regular rhythm.     Pulses: Normal pulses.     Heart sounds: Normal heart sounds.  Pulmonary:     Effort: Pulmonary effort is normal. No respiratory distress.     Breath sounds: Normal breath sounds.  Abdominal:  General: Bowel sounds are normal. There is no distension.     Palpations: Abdomen is soft.     Tenderness: There is no abdominal tenderness.  Musculoskeletal:        General: No swelling or deformity.  Skin:    General: Skin is warm and dry.  Neurological:     Comments: Mental Status: Patient is awake, alert, oriented x3 No signs of aphasia or neglect, somewhat slow to answer questions though this may be baseline Cranial Nerves: II: Pupils equal, round, and reactive to light.   III,IV, VI: EOMI without ptosis or diploplia.  V: Facial sensation is symmetric to light touch. VII: Mild facial droop at edge of lips on right VIII: hearing is intact to voice X: Uvula elevates symmetrically XI: Shoulder shrug is symmetric. XII: tongue is midline without atrophy or fasciculations.  Motor: Good effort thorughout, at Least 5/5 bilateral UE, 5/5 RLE, 4+/5 LLE Sensory: Sensation is grossly intact bilateral UEs & LEs, does report mild numbness and tingling of the right upper extremity Cerebellar: Finger-Nose intact bilalat     Labs on Admission: I have personally reviewed following labs and imaging studies  CBC: Recent Labs  Lab 08/26/20 1647  WBC 14.9*  NEUTROABS 9.6*  HGB 16.0  HCT 45.3  MCV 92.6  PLT 676    Basic Metabolic Panel: Recent  Labs  Lab 08/26/20 1647  NA 134*  K 4.3  CL 102  CO2 25  GLUCOSE 396*  BUN 13  CREATININE 0.92  CALCIUM 9.1    GFR: CrCl cannot be calculated (Unknown ideal weight.).  Liver Function Tests: Recent Labs  Lab 08/26/20 1647  AST 29  ALT 58*  ALKPHOS 102  BILITOT 0.6  PROT 7.3  ALBUMIN 3.7    Urine analysis:    Component Value Date/Time   COLORURINE YELLOW (A) 07/20/2019 1059   APPEARANCEUR CLEAR (A) 07/20/2019 1059   LABSPEC 1.017 07/20/2019 1059   PHURINE 6.0 07/20/2019 1059   GLUCOSEU >=500 (A) 07/20/2019 1059   HGBUR SMALL (A) 07/20/2019 1059   BILIRUBINUR NEGATIVE 07/20/2019 1059   KETONESUR 20 (A) 07/20/2019 1059   PROTEINUR NEGATIVE 07/20/2019 1059   NITRITE NEGATIVE 07/20/2019 1059   LEUKOCYTESUR NEGATIVE 07/20/2019 1059    Radiological Exams on Admission: CT HEAD WO CONTRAST  Result Date: 08/26/2020 CLINICAL DATA:  Third speech since 1 week ago. Dizziness and lightheadedness. EXAM: CT HEAD WITHOUT CONTRAST TECHNIQUE: Contiguous axial images were obtained from the base of the skull through the vertex without intravenous contrast. COMPARISON:  Head CT 06/17/2020. FINDINGS: Brain: There are bilateral basal ganglia lacunar type infarcts which appear new since the prior CT scan from May 2022. These could be acute or subacute. There is also a possible small white matter infarct near the frontal horn of the right lateral ventricle. No findings for hemorrhage. No hemispheric infarction. No intracranial mass lesions. No extra-axial fluid collections. Vascular: Age advanced atherosclerotic calcifications but no hyperdense vessels. Skull: No skull fracture or bone lesions. Sinuses/Orbits: The paranasal sinuses and mastoid air cells are clear. Other: No scalp lesions or scalp hematoma. IMPRESSION: 1. Bilateral basal ganglia lacunar type infarcts, new since the prior CT scan from May 2022. These could be acute or subacute. Recommend MRI brain with diffusion-weighted imaging for  further evaluation. 2. Possible small white matter infarct near the frontal horn of the right lateral ventricle. 3. No findings for hemorrhage or hemispheric infarction. Electronically Signed   By: Marijo Sanes M.D.   On: 08/26/2020 17:23  EKG: Independently reviewed.  Normal sinus rhythm at 90 bpm.  Assessment/Plan Principal Problem:   Acute CVA (cerebrovascular accident) [facility]) Active Problems:   STEMI s/p RCA stent 08/2012   HTN (hypertension)   HLD (hyperlipidemia)   Type 2 diabetes mellitus with hyperlipidemia (H[facility])   Coronary artery disease  Acute and/or subacute CVA > Patient with a history of prior CVA presenting with neurologic deficits for 1 week noted to have acute versus subacute CVA on CT head. > Reporting no residual deficits from prior stroke and currently noted to have dizziness and lightheadedness with leg weakness worse on left on exam.  Also some right arm numbness and tingling.  And mild right facial droop. > If this is indeed subacute with symptoms starting a week ago he would be out of the window for any intervention.  We will get MRI and allow for permissive hypertension while this is pending as he could have had acute on subacute infarct(s). > Did have Covid in January and tested positive again 6 days. - Neurology consult in the a.m. - Allow for permissive HTN (systolic < 258 and diastolic < 527), while awaiting MRI to confirm no acute CVA and only subacute. - Has received ASA 325 mg total today, continue 81 mg daily  - Start Plavix - Restart home atorvastatin - Echocardiogram  - Carotid doppler or CTA head & neck  - A1C  - Lipid panel  - Tele monitoring  - SLP eval - PT/OT  Covid infection > Patient's girlfriend reported that he tested positive for COVID 6 days ago, also had COVID in January.  He is asymptomatic from this. > Per note from Triage at PCP "Was diagnosed with Covid last week-test yesterday read negative for Covid" - Follow-up repeat test  here - Contacting her more precautions  Hyperlipidemia CAD > History of STEMI with stent placement in 2014. > Reports he may not have been taking his atorvastatin as prescribed due to missing a refill. - Restart home atorvastatin as above - Continue home aspirin - Hold home losartan while we are waiting for MRI to confirm acute or subacute stroke  Hypertension - Holding losartan for now as above.  Diabetes > Glucose 396 in the ED.  On 23 units of long-acting each morning at home, metformin, Sitagliptin. - 15 units long-acting in the morning and SSI with nightly coverage  DVT prophylaxis: Lovenox  Code Status:   Full  Family Communication:  Girlfriend updated by phone.   Disposition Plan:   Patient is from:  Home  Anticipated DC to:  Home  Anticipated DC date:  1 to 2 days  Anticipated DC barriers: None  Consults called:  None, will need neurology consult in the morning.   Admission status:  Observation, telemetry   Severity of Illness: The appropriate patient status for this patient is OBSERVATION. Observation status is judged to be reasonable and necessary in order to provide the required intensity of service to ensure the patient's safety. The patient's presenting symptoms, physical exam findings, and initial radiographic and laboratory data in the context of their medical condition is felt to place them at decreased risk for further clinical deterioration. Furthermore, it is anticipated that the patient will be medically stable for discharge from the hospital within 2 midnights of admission. The following factors support the patient status of observation.   " The patient's presenting symptoms include dizziness, facial droop, right arm numbnes, left lower extremity weakness. " The physical exam findings include facial droop, right  arm numbnes, left lower extremity weakness. " The initial radiographic and laboratory data are Lab work-up showed CMP with sodium 134, glucose 396, ALT  58.  CBC showed leukocytosis to 14.9 though he does appear degree of chronic leukocytosis.  PT, PTT, INR within normal notes.  Respiratory panel flu and COVID-negative.  CT head showed acute or subacute infarcts new from previous CT in May.   Marcelyn Bruins MD [facility]  How to contact the [facility] Attending or Consulting provider [place] or covering provider during after hours [place], for this patient?   Check the care team in [facility] and look for a) attending/consulting [facility] provider listed and b) the [facility] team listed Log into [email or web address] and use [place]'s universal password to access. If you do not have the password, please contact the hospital operator. Locate the [facility] provider you are looking for under [facility] and page to a number that you can be directly reached. If you still have difficulty reaching the provider, please page the [facility] (Director on Call) for the Hospitalists listed on amion for assistance.  08/26/2020, 10:21 PM

## 2020-08-26 NOTE — Telephone Encounter (Signed)
See previous note-GF to take patient to the ED for evaluation.

## 2020-08-26 NOTE — ED Triage Notes (Signed)
Pt comes with c/o possible stroke. Pt states slurred speech that started week ago. Pt states dizziness and lightheadedness. Pt states his wife dropped him off bc he needed to get checked out.  Pt states he hasn't noticed slurred speech.pt speaking in complete sentences. Pt denies any blurry vision or headache.

## 2020-08-26 NOTE — ED Notes (Signed)
Reviewed pt's results with Dr Michiel Sites; no further orders given at this time

## 2020-08-26 NOTE — ED Provider Notes (Signed)
[facility] Emergency Department Provider Note   ____________________________________________   Event Date/Time   First MD Initiated Contact with Patient 08/26/20 2120     (approximate)  I have reviewed the triage vital signs and the nursing notes.   HISTORY  Chief Complaint Aphasia    HPI Martin Mullen is a 40 y.o. male with past medical history of hypertension, hyperlipidemia, diabetes, CAD, and stroke who presents to the ED for slurred speech.  Patient states that about a week ago he started to notice that he was having difficulties with his balance and felt like he was going to fall over while walking.  He reports feeling weak in both legs with poor coordination.  His girlfriend was also concerned that his speech has been different than usual and she brought him to the ED for evaluation for possible stroke earlier today.  Patient states the symptoms have been constant for the past week with no change.  He denies any weakness in his upper extremities or changes in his vision.  He states he has been compliant with his medications.  He denies any fevers, cough, chest pain, or shortness of breath.        Past Medical History:  Diagnosis Date   Acute transmural inferior wall MI ([place]) 08/28/2012   .5 x 12 mm Veri-flex stent non-DES.   Chicken pox    Coronary artery disease    Inferior ST elevation myocardial infarction in July of 2014. Cardiac catheterization showed 95% distal RCA stenosis and 90% first diagonal stenosis with normal ejection fraction. He underwent PCI in bare-metal stent placement to the distal RCA.   Diabetes mellitus without complication ([place])    Hypercholesterolemia    Stroke [facility])    Tobacco use     Patient Active Problem List   Diagnosis Date Noted   Cerebrovascular accident (CVA) ([place])    GERD (gastroesophageal reflux disease) 07/20/2019   Coronary artery disease    Type 2 diabetes mellitus with hyperlipidemia ([place])  07/09/2013   STEMI s/p RCA stent 08/2012 09/05/2012   HTN (hypertension) 09/05/2012   HLD (hyperlipidemia) 09/05/2012   Adjustment disorder with mixed anxiety and depressed mood 09/05/2012    Past Surgical History:  Procedure Laterality Date   CORONARY ANGIOPLASTY WITH STENT PLACEMENT     DEBRIDEMENT AND CLOSURE WOUND N/A 09/03/2019   Procedure: Debridement and closure of scrotal wound;  Surgeon: Cindra Presume, MD;  Location: [place];  Service: Plastics;  Laterality: N/A;   DEBRIDEMENT AND CLOSURE WOUND N/A 07/25/2019   Procedure: DEBRIDEMENT AND CLOSURE WOUND;  Surgeon: Cindra Presume, MD;  Location: [facility] ORS;  Service: Plastics;  Laterality: N/A;   INCISION AND DRAINAGE ABSCESS N/A 07/20/2019   Procedure: INCISION AND DRAINAGE ABSCESS;  Surgeon: Lucas Mallow, MD;  Location: [facility] ORS;  Service: Urology;  Laterality: N/A;   LEFT HEART CATHETERIZATION WITH CORONARY ANGIOGRAM N/A 08/28/2012   Procedure: LEFT HEART CATHETERIZATION WITH CORONARY ANGIOGRAM;  Surgeon: Laverda Page, MD;  Location: [facility] CATH LAB;  Service: Cardiovascular;  Laterality: N/A;   WRIST SURGERY  1988    Prior to Admission medications   Medication Sig Start Date End Date Taking? Authorizing Provider  aspirin 81 MG EC tablet Take 1 tablet (81 mg total) by mouth daily. 09/05/12   Rai, Vernelle Emerald, MD  atorvastatin (LIPITOR) 80 MG tablet Take 80 mg by mouth daily. Patient not taking: Reported on 07/01/2020 11/27/19   [provider]  Blood Glucose Monitoring Suppl (BAYER CONTOUR NEXT MONITOR) w/Device KIT 1 Device 07/22/15   [name], NP  CONTOUR NEXT TEST test strip USE 1 STRIP TO CHECK GLUCOSE THREE TIMES DAILY AS NEEDED 09/05/18   [name], NP  cyclobenzaprine (FLEXERIL) 10 MG tablet Take 1 tablet (10 mg total) by mouth 3 (three) times daily as needed for muscle spasms. 06/25/20   [name], NP  insulin glargine (LANTUS SOLOSTAR) 100 UNIT/ML Solostar Pen INJECT 23 UNITS  UNDER THE SKIN EVERY DAY 04/30/20   [name], NP  Insulin Pen Needle 31G X 8 MM MISC 1 each by Does not apply route daily. 11/09/18   [name], NP  losartan (COZAAR) 50 MG tablet Take 1 tablet (50 mg total) by mouth daily. 06/11/19   [name], NP  metFORMIN (GLUCOPHAGE) 1000 MG tablet TAKE 1 TABLET(1000 MG) BY MOUTH TWICE DAILY WITH A MEAL 06/18/20   [name], [name], NP  naproxen (NAPROSYN) 500 MG tablet Take 1 tablet (500 mg total) by mouth 2 (two) times daily with a meal. Patient not taking: Reported on 07/15/2020 06/25/20   [name], NP  nitroGLYCERIN (NITROSTAT) 0.4 MG SL tablet DISSOLVE ONE TABLET UNDER THE TONGUE EVERY 5 MINUTES AS NEEDED FOR CHEST PAIN.  DO NOT EXCEED A TOTAL OF 3 DOSES IN 15 MINUTES Patient taking differently: DISSOLVE ONE TABLET UNDER THE TONGUE EVERY 5 MINUTES AS NEEDED FOR CHEST PAIN.  DO NOT EXCEED A TOTAL OF 3 DOSES IN 15 MINUTES 11/13/19   [name], NP  sitaGLIPtin (JANUVIA) 100 MG tablet TAKE 1 TABLET(100 MG) BY MOUTH DAILY 06/18/20   [name], NP    Allergies Patient has no known allergies.  Family History  Problem Relation Age of Onset   Arthritis Mother        Rheumatoid   Diabetes Mother    Hyperlipidemia Mother    Diabetes Father    Cancer Maternal Grandfather        Prostate   Parkinson's disease Maternal Grandfather     Social History Social History   Tobacco Use   Smoking status: Every Day    Packs/day: 1.00    Years: 30.00    Pack years: 30.00    Types: Cigarettes   Smokeless tobacco: Never  Vaping Use   Vaping Use: Never used  Substance Use Topics   Alcohol use: Not Currently    Alcohol/week: 0.0 standard drinks    Comment: occasional   Drug use: Yes    Types: Marijuana    Comment: occ    Review of Systems  Constitutional: No fever/chills Eyes: No visual changes. ENT: No sore throat. Cardiovascular: Denies chest pain. Respiratory: Denies shortness of breath. Gastrointestinal: No abdominal  pain.  No nausea, no vomiting.  No diarrhea.  No constipation. Genitourinary: Negative for dysuria. Musculoskeletal: Negative for back pain. Skin: Negative for rash. Neurological: Negative for headaches, f positive for leg weakness and unsteady gait.  ____________________________________________   PHYSICAL EXAM:  VITAL SIGNS: ED Triage Vitals [08/26/20 1645]  Enc Vitals Group     BP (!) 170/115     Pulse Rate 90     Resp 18     Temp 98.2 F (36.8 C)     Temp src      SpO2 97 %     Weight      Height      Head Circumference      Peak Flow  Pain Score 0     Pain Loc      Pain Edu?      Excl. in [place]?     Constitutional: Alert and oriented. Eyes: Conjunctivae are normal. Head: Atraumatic. Nose: No congestion/rhinnorhea. Mouth/Throat: Mucous membranes are moist. Neck: Normal ROM Cardiovascular: Normal rate, regular rhythm. Grossly normal heart sounds. Respiratory: Normal respiratory effort.  No retractions. Lungs CTAB. Gastrointestinal: Soft and nontender. No distention. Genitourinary: deferred Musculoskeletal: No lower extremity tenderness nor edema. Neurologic: Subtle word finding difficulties. No gross focal neurologic deficits are appreciated. Skin:  Skin is warm, dry and intact. No rash noted. Psychiatric: Mood and affect are normal. Speech and behavior are normal.  ____________________________________________   LABS (all labs ordered are listed, but only abnormal results are displayed)  Labs Reviewed  CBC - Abnormal; Notable for the following components:      Result Value   WBC 14.9 (*)    All other components within normal limits  DIFFERENTIAL - Abnormal; Notable for the following components:   Neutro Abs 9.6 (*)    All other components within normal limits  COMPREHENSIVE METABOLIC PANEL - Abnormal; Notable for the following components:   Sodium 134 (*)    Glucose, Bld 396 (*)    ALT 58 (*)    All other components within normal limits  CBG  MONITORING, ED - Abnormal; Notable for the following components:   Glucose-Capillary 375 (*)    All other components within normal limits  SARS CORONAVIRUS 2 (TAT 6-24 HRS)  PROTIME-INR  APTT   ____________________________________________  EKG  ED ECG REPORT I, Blake Divine, the attending physician, personally viewed and interpreted this ECG.   Date: 08/26/2020  EKG Time: 16:47  Rate: 90  Rhythm: normal sinus rhythm  Axis: Normal  Intervals:none  ST&T Change: None   PROCEDURES  Procedure(s) performed (including Critical Care):  Procedures   ____________________________________________   INITIAL IMPRESSION / ASSESSMENT AND PLAN / ED COURSE      40 year old male with past medical history of hypertension, hyperlipidemia, CAD, diabetes, and stroke who presents to the ED with 1 week of unsteady gait and lower extremity weakness along with subtle slurred speech.  CT head is concerning for acute versus subacute basal ganglia stroke, which would fit with his symptoms.  Labs remarkable for hyperglycemia but otherwise reassuring.  Patient states he took his usual 81 mg aspirin earlier today and we will give remainder of loading dose.  Plan to discuss with hospitalist for admission for further stroke work-up.      ____________________________________________   FINAL CLINICAL IMPRESSION(S) / ED DIAGNOSES  Final diagnoses:  Cerebrovascular accident (CVA), unspecified mechanism ([place])  Unsteady gait     ED Discharge Orders     None        Note:  This document was prepared using Dragon voice recognition software and may include unintentional dictation errors.    Blake Divine, MD 08/26/20 2140
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
## 2020-08-26 NOTE — ED Notes (Signed)
Pt to US.

## 2020-08-26 NOTE — ED Notes (Signed)
ED Provider at bedside. 

## 2020-08-27 ENCOUNTER — Observation Stay: Payer: BC Managed Care – PPO

## 2020-08-27 ENCOUNTER — Encounter: Payer: BC Managed Care – PPO | Admitting: Physical Therapy

## 2020-08-27 ENCOUNTER — Observation Stay
Admit: 2020-08-27 | Discharge: 2020-08-27 | Disposition: A | Discharge status: Home / Self Care | Payer: BC Managed Care – PPO | Attending: Internal Medicine | Admitting: Internal Medicine

## 2020-08-27 ENCOUNTER — Encounter: Payer: Self-pay | Admitting: Internal Medicine

## 2020-08-27 DIAGNOSIS — Z7901 Long term (current) use of anticoagulants: Secondary | ICD-10-CM | POA: Diagnosis not present

## 2020-08-27 DIAGNOSIS — I252 Old myocardial infarction: Secondary | ICD-10-CM | POA: Diagnosis not present

## 2020-08-27 DIAGNOSIS — U071 COVID-19: Secondary | ICD-10-CM | POA: Diagnosis present

## 2020-08-27 DIAGNOSIS — Z8616 Personal history of COVID-19: Secondary | ICD-10-CM | POA: Diagnosis not present

## 2020-08-27 DIAGNOSIS — F1210 Cannabis abuse, uncomplicated: Secondary | ICD-10-CM | POA: Diagnosis present

## 2020-08-27 DIAGNOSIS — Z833 Family history of diabetes mellitus: Secondary | ICD-10-CM | POA: Diagnosis not present

## 2020-08-27 DIAGNOSIS — Z8673 Personal history of transient ischemic attack (TIA), and cerebral infarction without residual deficits: Secondary | ICD-10-CM | POA: Diagnosis not present

## 2020-08-27 DIAGNOSIS — I639 Cerebral infarction, unspecified: Secondary | ICD-10-CM | POA: Diagnosis present

## 2020-08-27 DIAGNOSIS — R4701 Aphasia: Secondary | ICD-10-CM | POA: Diagnosis present

## 2020-08-27 DIAGNOSIS — I6522 Occlusion and stenosis of left carotid artery: Secondary | ICD-10-CM | POA: Diagnosis not present

## 2020-08-27 DIAGNOSIS — R29818 Other symptoms and signs involving the nervous system: Secondary | ICD-10-CM | POA: Diagnosis not present

## 2020-08-27 DIAGNOSIS — I10 Essential (primary) hypertension: Secondary | ICD-10-CM | POA: Diagnosis present

## 2020-08-27 DIAGNOSIS — E1169 Type 2 diabetes mellitus with other specified complication: Secondary | ICD-10-CM | POA: Diagnosis not present

## 2020-08-27 DIAGNOSIS — G8194 Hemiplegia, unspecified affecting left nondominant side: Secondary | ICD-10-CM | POA: Diagnosis present

## 2020-08-27 DIAGNOSIS — Z7902 Long term (current) use of antithrombotics/antiplatelets: Secondary | ICD-10-CM | POA: Diagnosis not present

## 2020-08-27 DIAGNOSIS — Z823 Family history of stroke: Secondary | ICD-10-CM | POA: Diagnosis not present

## 2020-08-27 DIAGNOSIS — D6852 Prothrombin gene mutation: Secondary | ICD-10-CM | POA: Diagnosis present

## 2020-08-27 DIAGNOSIS — Z8261 Family history of arthritis: Secondary | ICD-10-CM | POA: Diagnosis not present

## 2020-08-27 DIAGNOSIS — I6523 Occlusion and stenosis of bilateral carotid arteries: Secondary | ICD-10-CM | POA: Diagnosis not present

## 2020-08-27 DIAGNOSIS — E785 Hyperlipidemia, unspecified: Secondary | ICD-10-CM

## 2020-08-27 DIAGNOSIS — R590 Localized enlarged lymph nodes: Secondary | ICD-10-CM | POA: Diagnosis not present

## 2020-08-27 DIAGNOSIS — Z809 Family history of malignant neoplasm, unspecified: Secondary | ICD-10-CM | POA: Diagnosis not present

## 2020-08-27 DIAGNOSIS — Z820 Family history of epilepsy and other diseases of the nervous system: Secondary | ICD-10-CM | POA: Diagnosis not present

## 2020-08-27 DIAGNOSIS — Z79899 Other long term (current) drug therapy: Secondary | ICD-10-CM | POA: Diagnosis not present

## 2020-08-27 DIAGNOSIS — E1165 Type 2 diabetes mellitus with hyperglycemia: Secondary | ICD-10-CM | POA: Diagnosis present

## 2020-08-27 DIAGNOSIS — I6381 Other cerebral infarction due to occlusion or stenosis of small artery: Secondary | ICD-10-CM | POA: Diagnosis present

## 2020-08-27 DIAGNOSIS — Z794 Long term (current) use of insulin: Secondary | ICD-10-CM | POA: Diagnosis not present

## 2020-08-27 DIAGNOSIS — I2510 Atherosclerotic heart disease of native coronary artery without angina pectoris: Secondary | ICD-10-CM | POA: Diagnosis present

## 2020-08-27 DIAGNOSIS — Z7982 Long term (current) use of aspirin: Secondary | ICD-10-CM | POA: Diagnosis not present

## 2020-08-27 DIAGNOSIS — F1411 Cocaine abuse, in remission: Secondary | ICD-10-CM | POA: Diagnosis present

## 2020-08-27 DIAGNOSIS — F17210 Nicotine dependence, cigarettes, uncomplicated: Secondary | ICD-10-CM | POA: Diagnosis present

## 2020-08-27 DIAGNOSIS — F32A Depression, unspecified: Secondary | ICD-10-CM | POA: Diagnosis present

## 2020-08-27 LAB — SARS CORONAVIRUS 2 (TAT 6-24 HRS): SARS Coronavirus 2: POSITIVE — AB

## 2020-08-27 LAB — GLUCOSE, CAPILLARY
Glucose-Capillary: 279 mg/dL — ABNORMAL HIGH (ref 70–99)
Glucose-Capillary: 186 mg/dL — ABNORMAL HIGH (ref 70–99)

## 2020-08-27 LAB — LIPID PANEL
Cholesterol: 187 mg/dL (ref 0–200)
HDL: 26 mg/dL — ABNORMAL LOW (ref >40)
LDL Cholesterol: 125 mg/dL — ABNORMAL HIGH (ref 0–99)
Total CHOL/HDL Ratio: 7.2 RATIO
Triglycerides: 179 mg/dL — ABNORMAL HIGH (ref <150)
VLDL: 36 mg/dL (ref 0–40)

## 2020-08-27 LAB — HIV ANTIBODY (ROUTINE TESTING W REFLEX): HIV Screen 4th Generation wRfx: NONREACTIVE

## 2020-08-27 LAB — CBG MONITORING, ED
Glucose-Capillary: 241 mg/dL — ABNORMAL HIGH (ref 70–99)
Glucose-Capillary: 212 mg/dL — ABNORMAL HIGH (ref 70–99)
Glucose-Capillary: 192 mg/dL — ABNORMAL HIGH (ref 70–99)

## 2020-08-27 LAB — HEMOGLOBIN A1C
Hgb A1c MFr Bld: 9.4 % — ABNORMAL HIGH (ref 4.8–5.6)
Mean Plasma Glucose: 223.08 mg/dL

## 2020-08-27 MED ORDER — INSULIN GLARGINE 100 UNIT/ML ~~LOC~~ SOLN
10.0000 [IU] | Freq: Once | SUBCUTANEOUS | Status: AC
  Administered 2020-08-27: 10 [IU] via SUBCUTANEOUS
  Filled 2020-08-27: qty 0.1

## 2020-08-27 MED ORDER — SODIUM CHLORIDE 0.9 % IV SOLN: INTRAVENOUS | Status: DC

## 2020-08-27 MED ORDER — ENOXAPARIN SODIUM 60 MG/0.6ML IJ SOSY
0.5000 mg/kg | PREFILLED_SYRINGE | INTRAMUSCULAR | Status: DC
  Administered 2020-08-27 – 2020-08-28 (×2): 57.5 mg via SUBCUTANEOUS
  Filled 2020-08-27: qty 0.6
  Filled 2020-08-27: qty 0.57

## 2020-08-27 MED ORDER — PERFLUTREN LIPID MICROSPHERE
1.0000 mL | INTRAVENOUS | Status: AC | PRN
  Administered 2020-08-27: 3 mL via INTRAVENOUS
  Filled 2020-08-27: qty 10

## 2020-08-27 NOTE — Progress Notes (Signed)
Martin Mullen is a 40 y.o. male  [id number]  Primary Cardiologist: Lorine BearsArida, Muhammad MD Reason for Consultation: multiple strokes  HPI: Patient seen for multiple strokes. Resting comfortably in bed right now. Alert and oriented.   Review of Systems: Denies chest pain, shortness of breath, dizziness.    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   Chicken pox    Coronary artery disease    Inferior ST elevation myocardial infarction in July of 2014. Cardiac catheterization showed 95% distal RCA stenosis and 90% first diagonal stenosis with normal ejection fraction. He underwent PCI in bare-metal stent placement to the distal RCA.   Diabetes mellitus without complication (HCC)    Hypercholesterolemia    Stroke (HCC)    Tobacco use     (Not in a hospital admission)      stroke: mapping our early stages of recovery book   Does not apply Once   aspirin EC  81 mg Oral Daily   atorvastatin  80 mg Oral Daily   clopidogrel  75 mg Oral Daily   enoxaparin (LOVENOX) injection  0.5 mg/kg Subcutaneous Q24H   insulin aspart  0-15 Units Subcutaneous TID WC   insulin aspart  0-5 Units Subcutaneous QHS   insulin glargine  20 Units Subcutaneous QHS   nicotine  21 mg Transdermal Once   sodium chloride flush  3 mL Intravenous Once    Infusions:   No Known Allergies  Social History   Socioeconomic History   Marital status: Married    Spouse name: Not on file   Number of children: Not on file   Years of education: Not on file   Highest education level: Not on file  Occupational History   Not on file  Tobacco Use   Smoking status: Every Day    Packs/day: 1.00    Years: 30.00    Pack years: 30.00    Types: Cigarettes   Smokeless tobacco: Never  Vaping Use   Vaping Use: Never used  Substance and Sexual Activity   Alcohol use: Not Currently    Alcohol/week: 0.0 standard drinks    Comment:  occasional   Drug use: Yes    Types: Marijuana    Comment: occ   Sexual activity: Yes    Birth control/protection: None  Other Topics Concern   Not on file  Social History Narrative   Not on file   Social Determinants of Health   Financial Resource Strain: Not on file  Food Insecurity: Not on file  Transportation Needs: Not on file  Physical Activity: Not on file  Stress: Not on file  Social Connections: Not on file  Intimate Partner Violence: Not on file    Family History  Problem Relation Age of Onset   Arthritis Mother        Rheumatoid   Diabetes Mother    Hyperlipidemia Mother    Diabetes Father    Cancer Maternal Grandfather        Prostate   Parkinson's disease Maternal Grandfather     PHYSICAL EXAM: Vitals:   08/27/20 1104 08/27/20 1130  BP:  (!) 135/97  Pulse: 97 83  Resp: 12 18  Temp:    SpO2: 98% 95%    No intake or output data in the 24 hours ending 08/27/20 1231  General:  Well appearing. No respiratory difficulty HEENT: normal Neck: supple. no JVD.  Carotids 2+ bilat; no bruits. No lymphadenopathy or thryomegaly appreciated. Cor: PMI nondisplaced. Regular rate & rhythm. No rubs, gallops or murmurs. Lungs: clear Abdomen: soft, nontender, nondistended. No hepatosplenomegaly. No bruits or masses. Good bowel sounds. Extremities: no cyanosis, clubbing, rash, edema Neuro: alert & oriented x 3, cranial nerves grossly intact. moves all 4 extremities w/o difficulty. Affect pleasant.  ECG: 08/26/20 NSR, 90 bpm  Results for orders placed or performed during the hospital encounter of 08/26/20 (from the past 24 hour(s))  Protime-INR     Status: None   Collection Time: 08/26/20  4:47 PM  Result Value Ref Range   Prothrombin Time 12.2 11.4 - 15.2 seconds   INR 0.9 0.8 - 1.2  APTT     Status: None   Collection Time: 08/26/20  4:47 PM  Result Value Ref Range   aPTT 26 24 - 36 seconds  CBC     Status: Abnormal   Collection Time: 08/26/20  4:47 PM  Result  Value Ref Range   WBC 14.9 (H) 4.0 - 10.5 K/uL   RBC 4.89 4.22 - 5.81 MIL/uL   Hemoglobin 16.0 13.0 - 17.0 g/dL   HCT 16.1 09.6 - 04.5 %   MCV 92.6 80.0 - 100.0 fL   MCH 32.7 26.0 - 34.0 pg   MCHC 35.3 30.0 - 36.0 g/dL   RDW 40.9 81.1 - 91.4 %   Platelets 237 150 - 400 K/uL   nRBC 0.0 0.0 - 0.2 %  Differential     Status: Abnormal   Collection Time: 08/26/20  4:47 PM  Result Value Ref Range   Neutrophils Relative % 64 %   Neutro Abs 9.6 (H) 1.7 - 7.7 K/uL   Lymphocytes Relative 27 %   Lymphs Abs 4.0 0.7 - 4.0 K/uL   Monocytes Relative 6 %   Monocytes Absolute 0.9 0.1 - 1.0 K/uL   Eosinophils Relative 2 %   Eosinophils Absolute 0.3 0.0 - 0.5 K/uL   Basophils Relative 1 %   Basophils Absolute 0.1 0.0 - 0.1 K/uL   Immature Granulocytes 0 %   Abs Immature Granulocytes 0.06 0.00 - 0.07 K/uL  Comprehensive metabolic panel     Status: Abnormal   Collection Time: 08/26/20  4:47 PM  Result Value Ref Range   Sodium 134 (L) 135 - 145 mmol/L   Potassium 4.3 3.5 - 5.1 mmol/L   Chloride 102 98 - 111 mmol/L   CO2 25 22 - 32 mmol/L   Glucose, Bld 396 (H) 70 - 99 mg/dL   BUN 13 6 - 20 mg/dL   Creatinine, Ser 7.82 0.61 - 1.24 mg/dL   Calcium 9.1 8.9 - 95.6 mg/dL   Total Protein 7.3 6.5 - 8.1 g/dL   Albumin 3.7 3.5 - 5.0 g/dL   AST 29 15 - 41 U/L   ALT 58 (H) 0 - 44 U/L   Alkaline Phosphatase 102 38 - 126 U/L   Total Bilirubin 0.6 0.3 - 1.2 mg/dL   GFR, Estimated >21 >30 mL/min   Anion gap 7 5 - 15  CBG monitoring, ED     Status: Abnormal   Collection Time: 08/26/20  4:48 PM  Result Value Ref Range   Glucose-Capillary 375 (H) 70 - 99 mg/dL  SARS CORONAVIRUS 2 (TAT 6-24 HRS) Nasopharyngeal Nasopharyngeal Swab     Status: Abnormal   Collection Time: 08/26/20  9:37 PM   Specimen: Nasopharyngeal Swab  Result Value Ref Range   SARS Coronavirus 2 POSITIVE (A) NEGATIVE  CBG monitoring, ED     Status: Abnormal   Collection Time: 08/26/20 11:15 PM  Result Value Ref Range    Glucose-Capillary 241 (H) 70 - 99 mg/dL  Hemoglobin R6E     Status: Abnormal   Collection Time: 08/27/20  7:20 AM  Result Value Ref Range   Hgb A1c MFr Bld 9.4 (H) 4.8 - 5.6 %   Mean Plasma Glucose 223.08 mg/dL  Lipid panel     Status: Abnormal   Collection Time: 08/27/20  7:20 AM  Result Value Ref Range   Cholesterol 187 0 - 200 mg/dL   Triglycerides 454 (H) <150 mg/dL   HDL 26 (L) >09 mg/dL   Total CHOL/HDL Ratio 7.2 RATIO   VLDL 36 0 - 40 mg/dL   LDL Cholesterol 811 (H) 0 - 99 mg/dL  HIV Antibody (routine testing w rflx)     Status: None   Collection Time: 08/27/20  7:20 AM  Result Value Ref Range   HIV Screen 4th Generation wRfx Non Reactive Non Reactive  CBG monitoring, ED     Status: Abnormal   Collection Time: 08/27/20 10:18 AM  Result Value Ref Range   Glucose-Capillary 212 (H) 70 - 99 mg/dL   CT HEAD WO CONTRAST  Result Date: 08/26/2020 CLINICAL DATA:  Third speech since 1 week ago. Dizziness and lightheadedness. EXAM: CT HEAD WITHOUT CONTRAST TECHNIQUE: Contiguous axial images were obtained from the base of the skull through the vertex without intravenous contrast. COMPARISON:  Head CT 06/17/2020. FINDINGS: Brain: There are bilateral basal ganglia lacunar type infarcts which appear new since the prior CT scan from May 2022. These could be acute or subacute. There is also a possible small white matter infarct near the frontal horn of the right lateral ventricle. No findings for hemorrhage. No hemispheric infarction. No intracranial mass lesions. No extra-axial fluid collections. Vascular: Age advanced atherosclerotic calcifications but no hyperdense vessels. Skull: No skull fracture or bone lesions. Sinuses/Orbits: The paranasal sinuses and mastoid air cells are clear. Other: No scalp lesions or scalp hematoma. IMPRESSION: 1. Bilateral basal ganglia lacunar type infarcts, new since the prior CT scan from May 2022. These could be acute or subacute. Recommend MRI brain with  diffusion-weighted imaging for further evaluation. 2. Possible small white matter infarct near the frontal horn of the right lateral ventricle. 3. No findings for hemorrhage or hemispheric infarction. Electronically Signed   By: Rudie Meyer M.D.   On: 08/26/2020 17:23   MR ANGIO HEAD WO CONTRAST  Result Date: 08/27/2020 CLINICAL DATA:  Acute neuro deficit.  Stroke. EXAM: MRA HEAD WITHOUT CONTRAST TECHNIQUE: Angiographic images of the Circle of Willis were acquired using MRA technique without intravenous contrast. COMPARISON:  MRI head 08/26/2020 FINDINGS: Anterior circulation: Atherosclerotic disease in the cavernous carotid bilaterally. Moderate stenosis on the left. No significant stenosis on the right. Anterior and middle cerebral arteries patent bilaterally without large vessel occlusion or flow limiting stenosis. Posterior circulation: Left vertebral artery dominant and patent to the basilar. Left PICA patent. Right vertebral artery is small and ends in PICA. Basilar widely patent. Superior cerebellar and posterior cerebral arteries patent bilaterally without stenosis or large vessel occlusion. Anatomic variants: Negative for aneurysm Other: None IMPRESSION: No intracranial large vessel occlusion. Moderate stenosis left cavernous carotid. Electronically Signed   By: Marlan Palau M.D.   On: 08/27/2020 11:00   MR BRAIN WO CONTRAST  Result Date: 08/27/2020 CLINICAL DATA:  Initial evaluation for acute slurred speech. EXAM: MRI HEAD WITHOUT CONTRAST TECHNIQUE:  Multiplanar, multiecho pulse sequences of the brain and surrounding structures were obtained without intravenous contrast. COMPARISON:  Prior CT from earlier the same day. FINDINGS: Brain: Cerebral volume within normal limits for age. Remote lacunar infarcts present about the right corona radiata, bilateral basal ganglia, bilateral thalami, and pons/midbrain. 1.2 cm acute ischemic infarcts seen involving the right globus pallidus/internal capsule  (series 5, image 22). There is an additional 4 mm focus of acute ischemia involving the posterior left frontal cortex (series 5, image 31). Few additional vague foci of diffusion abnormality involving the left thalamocapsular region likely reflect subacute ischemic change (series 5, images 23, 20). One of these foci is positioned immediately adjacent to a chronic left thalamic lacunar infarct. No associated hemorrhage or mass effect. No other evidence for acute or subacute ischemia. Gray-white matter differentiation otherwise maintained. No other evidence for acute or chronic intracranial hemorrhage. No mass lesion, midline shift or mass effect. No hydrocephalus or extra-axial fluid collection. Pituitary gland suprasellar region normal. Midline structures intact. Vascular: Major intracranial vascular flow voids are maintained. Skull and upper cervical spine: Craniocervical junction within normal limits. Bone marrow signal intensity normal. No scalp soft tissue abnormality. Sinuses/Orbits: Globes and orbital soft tissues demonstrate no acute finding. Mild scattered mucosal thickening noted within the ethmoidal air cells and maxillary sinuses. Paranasal sinuses are otherwise clear. No mastoid effusion. Other: None. IMPRESSION: 1. 1.2 cm acute ischemic nonhemorrhagic right basal ganglia/internal capsule infarct. 2. Additional 4 mm acute ischemic nonhemorrhagic cortical infarct involving the posterior left frontal cortex. 3. Few additional vague subcentimeter foci of diffusion abnormality involving the left thalamocapsular region, likely subacute ischemic changes. 4. Underlying remote lacunar infarcts involving the right corona radiata, bilateral basal ganglia, bilateral thalami, and pons/midbrain. Electronically Signed   By: Rise Mu M.D.   On: 08/27/2020 00:54   US Carotid Bilateral (at [facility] and [facility] only)  Result Date: 08/27/2020 CLINICAL DATA:  Hemorrhagic infarct in the right basal ganglia EXAM:  BILATERAL CAROTID DUPLEX ULTRASOUND TECHNIQUE: Wallace Cullens scale imaging, color Doppler and duplex ultrasound were performed of bilateral carotid and vertebral arteries in the neck. COMPARISON:  MRI from the previous day, CT a of the neck from 12/22/2019. FINDINGS: Criteria: Quantification of carotid stenosis is based on velocity parameters that correlate the residual internal carotid diameter with NASCET-based stenosis levels, using the diameter of the distal internal carotid lumen as the denominator for stenosis measurement. The following velocity measurements were obtained: RIGHT ICA: 61/23 cm/sec CCA:  97/15 cm/sec SYSTOLIC ICA/CCA RATIO:  1.2 ECA:  82 cm/sec LEFT ICA:  60/20 cm/sec CCA:  115/16 cm/sec SYSTOLIC ICA/CCA RATIO:  0.8 ECA:  78 cm/sec RIGHT CAROTID ARTERY: Examination of preliminary grayscale images demonstrates no significant atherosclerotic plaque in the carotid system on the right. The waveforms, velocities and flow velocity ratios show no evidence of focal hemodynamically significant stenosis. RIGHT VERTEBRAL ARTERY:  Antegrade in nature. LEFT CAROTID ARTERY: Preliminary grayscale images of the left carotid system demonstrate no evidence of significant atherosclerotic plaque. Waveforms, velocities and flow velocity ratios show no evidence of focal hemodynamically significant stenosis. LEFT VERTEBRAL ARTERY:  Antegrade in nature. Note is made of lymph nodes in the neck bilaterally with normal fatty hila. On the left the node measures 1 cm in short axis. Node adjacent to the right external iliac artery measures 9.6 mm in short axis. These correspond the lymph nodes seen on prior CT examination. IMPRESSION: No evidence of focal hemodynamically significant stenosis bilaterally. Small lymph nodes are seen bilaterally. These are stable  from a prior CT in 2021. Electronically Signed   By: Alcide Clever M.D.   On: 08/27/2020 01:41     ASSESSMENT AND PLAN: Patient currently asymptomatic. Plan for TEE tomorrow.  TEE explained to patient. Patient agrees to move forward with TEE.   Museum/gallery conservator  FNP-C

## 2020-08-27 NOTE — Progress Notes (Signed)
Chart reviewed, Pt visited. Speech clear and appropriate. Pt reports no dysphagia. NO ST needs at this time. Please reconsult if any needs arise that we can assist with.

## 2020-08-27 NOTE — Progress Notes (Signed)
  TEE cancelled for now due to patient being COVID positive, unable to accommodate. Will reschedule as an outpatient when patient is no longer Covid positive.

## 2020-08-27 NOTE — Progress Notes (Signed)
PROGRESS NOTE    MITSUO BUDNICK  ERD:[id number] DOB: 24-Sep-1980 DOA: 08/26/2020 PCP: [name], NP    Chief Complaint  Patient presents with   Aphasia    Brief Narrative:  WYATT THORSTENSON is a 40 y.o. male with medical history significant of adjustment sorter, anxiety, depression, CAD, CVA, hyperlipidemia, diabetes, hypertension, GERD who presents with some neurologic deficits and concern for possible stroke. Patient has history of prior CVA as above and has had some deficits in the past 5 days , with dizziness and leg weakness.  Pt was seen in November  for bithalamic strokes,full work up was recommended, but pt left AMA. His work up so far showed factor II prothrombin gene mutation.  CT head showed acute or subacute infarcts new from previous CT in May. MRI brain without contrast showed multiple infarcts. Neurology consulted. Hem- oncology consulted to see if he needs anti coagulation.    Assessment & Plan:   Principal Problem:   Acute CVA (cerebrovascular accident) [facility]) Active Problems:   STEMI s/p RCA stent 08/2012   HTN (hypertension)   HLD (hyperlipidemia)   Type 2 diabetes mellitus with hyperlipidemia (HCC)   Coronary artery disease   Acute ischemic stroke, right basal ganglia stroke, acute ischemic nonhemorrhagic cortical infarct involving the posterior left frontal cortex.  In the setting of prothrombin gene point mutation.  He was initially started on aspirin and plavix.  Heme onc consulted to see if he needs anti coagulation.  LDL is 125, and A1c is 9.4.  MRA shows No intracranial large vessel occlusion. Moderate stenosis left cavernous carotid. Carotid duplex No evidence of focal hemodynamically significant stenosis bilaterally. Echocardiogram pending.  In view of multiple strokes, TEE ordered for further evaluation.     Hypertension:  Well controlled.    Uncontrolled DM with hyperglycemia:  CBG (last 3)  Recent Labs    08/26/20 2315  08/27/20 1018 08/27/20 1308  GLUCAP 241* 212* 192*   A1c is 9.7%.  Continue with Lantus 20 units daily and SSI.     Hyperlipidemia:  Continue with statin.     Tobacco abuse:  On nicotine patch.    CAD S/p RCA STENT. Pt currently denies any chest pain or sob.    COVID 19 infection;  Pt is currently asymptomatic.  refused IV remdesevir or any anti viral meds.      DVT prophylaxis: (Lovenox) Code Status: (Full code) Family Communication: none at bedside Disposition:   Status is: Observation  The patient will require care spanning > 2 midnights and should be moved to inpatient because: Ongoing diagnostic testing needed not appropriate for outpatient work up, Unsafe d/c plan, and IV treatments appropriate due to intensity of illness or inability to take PO  Dispo: The patient is from: Home              Anticipated d/c is to: Home              Patient currently is not medically stable to d/c.   Difficult to place patient No       Consultants:  Cardiology Neurology Hem- oncology  Procedures:  MRI brain Echocardiogram.  CAROTID DUPLEX.   Antimicrobials: NONE.    Subjective: No chest pain or sob.   Objective: Vitals:   08/27/20 0930 08/27/20 1000 08/27/20 1101 08/27/20 1104  BP: (!) 130/91 129/72    Pulse: 79  100 97  Resp: 10  18 12   Temp:      SpO2:  98% 98%  Weight:       No intake or output data in the 24 hours ending 08/27/20 1120 Filed Weights   08/26/20 2316  Weight: 115.7 kg    Examination:  General exam: Appears calm and comfortable  Respiratory system: Clear to auscultation. Respiratory effort normal. Cardiovascular system: S1 & S2 heard, RRR. No JVD, No pedal edema. Gastrointestinal system: Abdomen is nondistended, soft and nontender.  Normal bowel sounds heard. Central nervous system: Alert and oriented., dysarthric,  left sided weakness.  Extremities: Symmetric 5 x 5 power. Skin: No rashes, lesions or ulcers Psychiatry: Mood  & affect appropriate.     Data Reviewed: I have personally reviewed following labs and imaging studies  CBC: Recent Labs  Lab 08/26/20 1647  WBC 14.9*  NEUTROABS 9.6*  HGB 16.0  HCT 45.3  MCV 92.6  PLT 237    Basic Metabolic Panel: Recent Labs  Lab 08/26/20 1647  NA 134*  K 4.3  CL 102  CO2 25  GLUCOSE 396*  BUN 13  CREATININE 0.92  CALCIUM 9.1    GFR: Estimated Creatinine Clearance: 136 mL/min (by C-G formula based on SCr of 0.92 mg/dL).  Liver Function Tests: Recent Labs  Lab 08/26/20 1647  AST 29  ALT 58*  ALKPHOS 102  BILITOT 0.6  PROT 7.3  ALBUMIN 3.7    CBG: Recent Labs  Lab 08/26/20 1648 08/26/20 2315 08/27/20 1018  GLUCAP 375* 241* 212*     Recent Results (from the past 240 hour(s))  SARS CORONAVIRUS 2 (TAT 6-24 HRS) Nasopharyngeal Nasopharyngeal Swab     Status: Abnormal   Collection Time: 08/26/20  9:37 PM   Specimen: Nasopharyngeal Swab  Result Value Ref Range Status   SARS Coronavirus 2 POSITIVE (A) NEGATIVE Final    Comment: (NOTE) SARS-CoV-2 target nucleic acids are DETECTED.  The SARS-CoV-2 RNA is generally detectable in upper and lower respiratory specimens during the acute phase of infection. Positive results are indicative of the presence of SARS-CoV-2 RNA. Clinical correlation with patient history and other diagnostic information is  necessary to determine patient infection status. Positive results do not rule out bacterial infection or co-infection with other viruses.  The expected result is Negative.  Fact Sheet for Patients: HairSlick.no  Fact Sheet for Healthcare Providers: quierodirigir.com  This test is not yet approved or cleared by the [place] FDA and  has been authorized for detection and/or diagnosis of SARS-CoV-2 by FDA under an Emergency Use Authorization (EUA). This EUA will remain  in effect (meaning this test can be used) for the duration of  the COVID-19 declaration under Section 564(b)(1) of the Act, 21 U. S.C. section 360bbb-3(b)(1), unless the authorization is terminated or revoked sooner.   Performed at [facility], [address] [place], [place] [address]          Radiology Studies: CT HEAD WO CONTRAST  Result Date: 08/26/2020 CLINICAL DATA:  Third speech since 1 week ago. Dizziness and lightheadedness. EXAM: CT HEAD WITHOUT CONTRAST TECHNIQUE: Contiguous axial images were obtained from the base of the skull through the vertex without intravenous contrast. COMPARISON:  Head CT 06/17/2020. FINDINGS: Brain: There are bilateral basal ganglia lacunar type infarcts which appear new since the prior CT scan from May 2022. These could be acute or subacute. There is also a possible small white matter infarct near the frontal horn of the right lateral ventricle. No findings for hemorrhage. No hemispheric infarction. No intracranial mass lesions. No extra-axial fluid collections.  Vascular: Age advanced atherosclerotic calcifications but no hyperdense vessels. Skull: No skull fracture or bone lesions. Sinuses/Orbits: The paranasal sinuses and mastoid air cells are clear. Other: No scalp lesions or scalp hematoma. IMPRESSION: 1. Bilateral basal ganglia lacunar type infarcts, new since the prior CT scan from May 2022. These could be acute or subacute. Recommend MRI brain with diffusion-weighted imaging for further evaluation. 2. Possible small white matter infarct near the frontal horn of the right lateral ventricle. 3. No findings for hemorrhage or hemispheric infarction. Electronically Signed   By: Rudie Meyer M.D.   On: 08/26/2020 17:23   MR ANGIO HEAD WO CONTRAST  Result Date: 08/27/2020 CLINICAL DATA:  Acute neuro deficit.  Stroke. EXAM: MRA HEAD WITHOUT CONTRAST TECHNIQUE: Angiographic images of the Circle of Willis were acquired using MRA technique without intravenous contrast. COMPARISON:  MRI head 08/26/2020 FINDINGS:  Anterior circulation: Atherosclerotic disease in the cavernous carotid bilaterally. Moderate stenosis on the left. No significant stenosis on the right. Anterior and middle cerebral arteries patent bilaterally without large vessel occlusion or flow limiting stenosis. Posterior circulation: Left vertebral artery dominant and patent to the basilar. Left PICA patent. Right vertebral artery is small and ends in PICA. Basilar widely patent. Superior cerebellar and posterior cerebral arteries patent bilaterally without stenosis or large vessel occlusion. Anatomic variants: Negative for aneurysm Other: None IMPRESSION: No intracranial large vessel occlusion. Moderate stenosis left cavernous carotid. Electronically Signed   By: Marlan Palau M.D.   On: 08/27/2020 11:00   MR BRAIN WO CONTRAST  Result Date: 08/27/2020 CLINICAL DATA:  Initial evaluation for acute slurred speech. EXAM: MRI HEAD WITHOUT CONTRAST TECHNIQUE: Multiplanar, multiecho pulse sequences of the brain and surrounding structures were obtained without intravenous contrast. COMPARISON:  Prior CT from earlier the same day. FINDINGS: Brain: Cerebral volume within normal limits for age. Remote lacunar infarcts present about the right corona radiata, bilateral basal ganglia, bilateral thalami, and pons/midbrain. 1.2 cm acute ischemic infarcts seen involving the right globus pallidus/internal capsule (series 5, image 22). There is an additional 4 mm focus of acute ischemia involving the posterior left frontal cortex (series 5, image 31). Few additional vague foci of diffusion abnormality involving the left thalamocapsular region likely reflect subacute ischemic change (series 5, images 23, 20). One of these foci is positioned immediately adjacent to a chronic left thalamic lacunar infarct. No associated hemorrhage or mass effect. No other evidence for acute or subacute ischemia. Gray-white matter differentiation otherwise maintained. No other evidence for  acute or chronic intracranial hemorrhage. No mass lesion, midline shift or mass effect. No hydrocephalus or extra-axial fluid collection. Pituitary gland suprasellar region normal. Midline structures intact. Vascular: Major intracranial vascular flow voids are maintained. Skull and upper cervical spine: Craniocervical junction within normal limits. Bone marrow signal intensity normal. No scalp soft tissue abnormality. Sinuses/Orbits: Globes and orbital soft tissues demonstrate no acute finding. Mild scattered mucosal thickening noted within the ethmoidal air cells and maxillary sinuses. Paranasal sinuses are otherwise clear. No mastoid effusion. Other: None. IMPRESSION: 1. 1.2 cm acute ischemic nonhemorrhagic right basal ganglia/internal capsule infarct. 2. Additional 4 mm acute ischemic nonhemorrhagic cortical infarct involving the posterior left frontal cortex. 3. Few additional vague subcentimeter foci of diffusion abnormality involving the left thalamocapsular region, likely subacute ischemic changes. 4. Underlying remote lacunar infarcts involving the right corona radiata, bilateral basal ganglia, bilateral thalami, and pons/midbrain. Electronically Signed   By: Rise Mu M.D.   On: 08/27/2020 00:54   US Carotid Bilateral (at [facility] and [facility]  only)  Result Date: 08/27/2020 CLINICAL DATA:  Hemorrhagic infarct in the right basal ganglia EXAM: BILATERAL CAROTID DUPLEX ULTRASOUND TECHNIQUE: Wallace Cullens scale imaging, color Doppler and duplex ultrasound were performed of bilateral carotid and vertebral arteries in the neck. COMPARISON:  MRI from the previous day, CT a of the neck from 12/22/2019. FINDINGS: Criteria: Quantification of carotid stenosis is based on velocity parameters that correlate the residual internal carotid diameter with NASCET-based stenosis levels, using the diameter of the distal internal carotid lumen as the denominator for stenosis measurement. The following velocity measurements were  obtained: RIGHT ICA: 61/23 cm/sec CCA:  97/15 cm/sec SYSTOLIC ICA/CCA RATIO:  1.2 ECA:  82 cm/sec LEFT ICA:  60/20 cm/sec CCA:  115/16 cm/sec SYSTOLIC ICA/CCA RATIO:  0.8 ECA:  78 cm/sec RIGHT CAROTID ARTERY: Examination of preliminary grayscale images demonstrates no significant atherosclerotic plaque in the carotid system on the right. The waveforms, velocities and flow velocity ratios show no evidence of focal hemodynamically significant stenosis. RIGHT VERTEBRAL ARTERY:  Antegrade in nature. LEFT CAROTID ARTERY: Preliminary grayscale images of the left carotid system demonstrate no evidence of significant atherosclerotic plaque. Waveforms, velocities and flow velocity ratios show no evidence of focal hemodynamically significant stenosis. LEFT VERTEBRAL ARTERY:  Antegrade in nature. Note is made of lymph nodes in the neck bilaterally with normal fatty hila. On the left the node measures 1 cm in short axis. Node adjacent to the right external iliac artery measures 9.6 mm in short axis. These correspond the lymph nodes seen on prior CT examination. IMPRESSION: No evidence of focal hemodynamically significant stenosis bilaterally. Small lymph nodes are seen bilaterally. These are stable from a prior CT in 2021. Electronically Signed   By: Alcide Clever M.D.   On: 08/27/2020 01:41        Scheduled Meds:   stroke: mapping our early stages of recovery book   Does not apply Once   aspirin EC  81 mg Oral Daily   atorvastatin  80 mg Oral Daily   clopidogrel  75 mg Oral Daily   enoxaparin (LOVENOX) injection  0.5 mg/kg Subcutaneous Q24H   insulin aspart  0-15 Units Subcutaneous TID WC   insulin aspart  0-5 Units Subcutaneous QHS   insulin glargine  20 Units Subcutaneous QHS   nicotine  21 mg Transdermal Once   sodium chloride flush  3 mL Intravenous Once   Continuous Infusions:   LOS: 0 days        Kathlen Mody, MD [facility]   To contact the attending provider between 7A-7P or the  covering provider during after hours 7P-7A, please log into the web site [email or web address] and access using universal [place] password for that web site. If you do not have the password, please call the hospital operator.  08/27/2020, 11:20 AM

## 2020-08-27 NOTE — Consult Note (Signed)
Neurology Consultation  Reason for Consult: Stroke Referring Physician: Dr. Karleen Hampshire, [facility]  CC: Left-sided weakness  History is obtained from: Patient, chart  HPI: [place] Cawood Mroczkowski is a 40 y.o. male past medical history of coronary artery disease with MI at the age of 58, diabetes, hypercholesterolemia, tobacco abuse, marijuana abuse, bilateral thalamic infarctions with no residual deficit in November 2021, presenting for evaluation of 1 week of slurred speech and some left-sided weakness. His last known well is about 7 days ago. He said that his girlfriend started noticing that he is slurring his words and he was having some difficulty bearing his weight on the left leg. When he was seen here in November for the bithalamic strokes, full work-up including hypercoagulable work-up and possible TEE was recommended but he left without the work-up being completed. Seems he is willing to stay to complete the work-up this time. The concern last time was that the strokes are likely small vessel etiology-concomitant small vessel disease but might be embolic as well.  The question is still remained unanswered. He currently admits to using tobacco and marijuana but denies any illicit substance use.  His history in the past did include cocaine abuse which she says he is not been doing recently.  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 He has had outpatient neurology follow-up, and a oncology referral was made. He has seen heme-onc-Per the recommendations, the thrombophilia associated with that mutation is more for venous thrombosis in individuals with heterozygous and it is unclear whether it increases the risk of VTE recurrence but it does not appear to be a major risk factor and arterial thrombosis.  LKW: 1 week ago tpa given?: no, outside the window Premorbid modified Rankin scale (mRS): 0-complete resolution of all symptoms from the last stroke and  was back to baseline  ROS: Full ROS was performed and is negative except as noted in the HPI.   Past Medical History:  Diagnosis Date   Acute transmural inferior wall MI ([place]) 08/28/2012   .5 x 12 mm Veri-flex stent non-DES.   Chicken pox    Coronary artery disease    Inferior ST elevation myocardial infarction in July of 2014. Cardiac catheterization showed 95% distal RCA stenosis and 90% first diagonal stenosis with normal ejection fraction. He underwent PCI in bare-metal stent placement to the distal RCA.   Diabetes mellitus without complication ([place])    Hypercholesterolemia    Stroke ([place])    Tobacco use         Family History  Problem Relation Age of Onset   Arthritis Mother        Rheumatoid   Diabetes Mother    Hyperlipidemia Mother    Diabetes Father    Cancer Maternal Grandfather        Prostate   Parkinson's disease Maternal Grandfather      Social History:   reports that he has been smoking cigarettes. He has a 30.00 pack-year smoking history. He has never used smokeless tobacco. He reports previous alcohol use. He reports current drug use. Drug: Marijuana.  Medications  Current Facility-Administered Medications:     stroke: mapping our early stages of recovery book, , Does not apply, Once, Marcelyn Bruins, MD   acetaminophen (TYLENOL) tablet 650 mg, 650 mg, Oral, Q4H PRN **OR** acetaminophen (TYLENOL) 160 MG/5ML solution 650 mg, 650 mg, Per Tube, Q4H PRN **OR** acetaminophen (TYLENOL) suppository 650 mg, 650 mg, Rectal, Q4H PRN, Marcelyn Bruins, MD  aspirin EC tablet 81 mg, 81 mg, Oral, Daily, Marcelyn Bruins, MD   atorvastatin (LIPITOR) tablet 80 mg, 80 mg, Oral, Daily, Marcelyn Bruins, MD   clopidogrel (PLAVIX) tablet 75 mg, 75 mg, Oral, Daily, Marcelyn Bruins, MD   enoxaparin (LOVENOX) injection 57.5 mg, 0.5 mg/kg, Subcutaneous, Q24H, Belue, Nathan S, RPH   insulin aspart (novoLOG) injection 0-15 Units, 0-15 Units, Subcutaneous, TID WC,  Marcelyn Bruins, MD   insulin aspart (novoLOG) injection 0-5 Units, 0-5 Units, Subcutaneous, QHS, Marcelyn Bruins, MD, 2 Units at 08/26/20 2325   insulin glargine (LANTUS) injection 20 Units, 20 Units, Subcutaneous, QHS, Marcelyn Bruins, MD, 20 Units at 08/26/20 2325   nicotine (NICODERM CQ - dosed in mg/24 hours) patch 21 mg, 21 mg, Transdermal, Once, Blake Divine, MD, 21 mg at 08/26/20 2324   senna-docusate (Senokot-S) tablet 1 tablet, 1 tablet, Oral, QHS PRN, Marcelyn Bruins, MD   sodium chloride flush (NS) 0.9 % injection 3 mL, 3 mL, Intravenous, Once, Marcelyn Bruins, MD  Current Outpatient Medications:    aspirin 81 MG EC tablet, Take 1 tablet (81 mg total) by mouth daily., Disp: 30 tablet, Rfl: 4   atorvastatin (LIPITOR) 80 MG tablet, Take 80 mg by mouth daily. (Patient not taking: Reported on 07/01/2020), Disp: , Rfl:    Blood Glucose Monitoring Suppl (BAYER CONTOUR NEXT MONITOR) w/Device KIT, 1 Device, Disp: 1 kit, Rfl: 0   CONTOUR NEXT TEST test strip, USE 1 STRIP TO CHECK GLUCOSE THREE TIMES DAILY AS NEEDED, Disp: 100 each, Rfl: 0   cyclobenzaprine (FLEXERIL) 10 MG tablet, Take 1 tablet (10 mg total) by mouth 3 (three) times daily as needed for muscle spasms., Disp: 30 tablet, Rfl: 0   insulin glargine (LANTUS SOLOSTAR) 100 UNIT/ML Solostar Pen, INJECT 23 UNITS UNDER THE SKIN EVERY DAY, Disp: 15 mL, Rfl: 1   Insulin Pen Needle 31G X 8 MM MISC, 1 each by Does not apply route daily., Disp: 90 each, Rfl: 1   losartan (COZAAR) 50 MG tablet, Take 1 tablet (50 mg total) by mouth daily., Disp: 30 tablet, Rfl: 0   metFORMIN (GLUCOPHAGE) 1000 MG tablet, TAKE 1 TABLET(1000 MG) BY MOUTH TWICE DAILY WITH A MEAL, Disp: 180 tablet, Rfl: 0   naproxen (NAPROSYN) 500 MG tablet, Take 1 tablet (500 mg total) by mouth 2 (two) times daily with a meal. (Patient not taking: Reported on 07/15/2020), Disp: 20 tablet, Rfl: 0   nitroGLYCERIN (NITROSTAT) 0.4 MG SL tablet, DISSOLVE ONE TABLET  UNDER THE TONGUE EVERY 5 MINUTES AS NEEDED FOR CHEST PAIN.  DO NOT EXCEED A TOTAL OF 3 DOSES IN 15 MINUTES (Patient taking differently: DISSOLVE ONE TABLET UNDER THE TONGUE EVERY 5 MINUTES AS NEEDED FOR CHEST PAIN.  DO NOT EXCEED A TOTAL OF 3 DOSES IN 15 MINUTES), Disp: 25 tablet, Rfl: 2   sitaGLIPtin (JANUVIA) 100 MG tablet, TAKE 1 TABLET(100 MG) BY MOUTH DAILY, Disp: 90 tablet, Rfl: 0  Facility-Administered Medications Ordered in Other Encounters:    perflutren lipid microspheres (DEFINITY) IV suspension, 1-10 mL, Intravenous, PRN, Marcelyn Bruins, MD, 3 mL at 08/27/20 0851   Exam: Current vital signs: BP 128/78   Pulse 67   Temp 98.2 F (36.8 C)   Resp 10   Wt 115.7 kg   SpO2 98%   BMI 36.59 kg/m  Vital signs in last 24 hours: Temp:  [98.2 F (36.8 C)] 98.2 F (36.8 C) (07/13 1645) Pulse Rate:  [66-90] 67 (07/14  0430) Resp:  [10-18] 10 (07/14 0730) BP: (120-170)/(78-115) 128/78 (07/14 0730) SpO2:  [94 %-98 %] 98 % (07/14 0430) Weight:  [115.7 kg] 115.7 kg (07/13 2316) General: Awake alert in no distress HEENT: Normocephalic/atraumatic Lungs: Clear Cardiovascular: S1-S2 heard, regular rate rhythm Abdomen soft nondistended nontender Extremities warm well perfused Neurological exam Awake alert oriented x3 Mildly dysarthric No aphasia Cranial nerves II to XII intact Motor exam with no vertical drift-subtle left upper extremity weakness and subtle left lower extremity weakness 4+/5. Sensation intact light touch all over without extinction Coordination with no dysmetria although he says that he does feel that the heel-knee-shin testing is difficult to perform with his right leg. Gait testing deferred at this time NIH stroke scale-1 for dysarthria  Labs I have reviewed labs in epic and the results pertinent to this consultation are:   CBC    Component Value Date/Time   WBC 14.9 (H) 08/26/2020 1647   RBC 4.89 08/26/2020 1647   HGB 16.0 08/26/2020 1647   HGB 16.9  11/13/2019 1550   HCT 45.3 08/26/2020 1647   HCT 49.9 11/13/2019 1550   PLT 237 08/26/2020 1647   PLT 271 11/13/2019 1550   MCV 92.6 08/26/2020 1647   MCV 93 11/13/2019 1550   MCV 93 01/29/2013 0040   MCH 32.7 08/26/2020 1647   MCHC 35.3 08/26/2020 1647   RDW 12.9 08/26/2020 1647   RDW 13.6 11/13/2019 1550   RDW 13.8 01/29/2013 0040   LYMPHSABS 4.0 08/26/2020 1647   MONOABS 0.9 08/26/2020 1647   EOSABS 0.3 08/26/2020 1647   BASOSABS 0.1 08/26/2020 1647    CMP     Component Value Date/Time   NA 134 (L) 08/26/2020 1647   NA 140 11/13/2019 1550   NA 139 01/29/2013 0040   K 4.3 08/26/2020 1647   K 4.0 01/29/2013 0040   CL 102 08/26/2020 1647   CL 107 01/29/2013 0040   CO2 25 08/26/2020 1647   CO2 28 01/29/2013 0040   GLUCOSE 396 (H) 08/26/2020 1647   GLUCOSE 228 (H) 01/29/2013 0040   BUN 13 08/26/2020 1647   BUN 10 11/13/2019 1550   BUN 15 01/29/2013 0040   CREATININE 0.92 08/26/2020 1647   CREATININE 0.94 07/01/2020 0824   CALCIUM 9.1 08/26/2020 1647   CALCIUM 9.2 01/29/2013 0040   PROT 7.3 08/26/2020 1647   PROT 7.4 11/13/2019 1550   ALBUMIN 3.7 08/26/2020 1647   ALBUMIN 4.6 11/13/2019 1550   AST 29 08/26/2020 1647   ALT 58 (H) 08/26/2020 1647   ALKPHOS 102 08/26/2020 1647   BILITOT 0.6 08/26/2020 1647   BILITOT 0.3 11/13/2019 1550   GFRNONAA >60 08/26/2020 1647   GFRNONAA >60 01/29/2013 0040   GFRAA 104 11/13/2019 1550   GFRAA >60 01/29/2013 0040    Lipid Panel     Component Value Date/Time   CHOL 187 08/27/2020 0720   CHOL 133 11/13/2019 1550   TRIG 179 (H) 08/27/2020 0720   HDL 26 (L) 08/27/2020 0720   HDL 24 (L) 11/13/2019 1550   CHOLHDL 7.2 08/27/2020 0720   VLDL 36 08/27/2020 0720   LDLCALC 125 (H) 08/27/2020 0720   LDLCALC 105 (H) 07/01/2020 0824   LDLDIRECT 126.0 06/20/2017 1510  COVID-positive  Hypercoagulable work-up from last admission showed a 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  Anticardiolipin IgM 14 elevated-normal value 0-12. Anticardiolipin IgG less than 9-normal Anticardiolipin IgA less than 9-normal  Imaging  I have reviewed the images obtained: MRI examination of the brain-today shows acute ischemic nonhemorrhagic right basal ganglia/internal capsular infarct.  Additionally there is a 4 mm acute ischemic nonhemorrhagic cortical infarct involving the posterior left frontal cortex.  Few additional vague subcentimeter foci of diffusion abnormality involving the left thalamic capsular region likely subacute ischemic changes seen on diffusion-weighted imaging.  Underlying remote lacunar infarctions along the right corona radiata and basal ganglia as well as bilateral thalami and pons and midbrain are also seen.  Carotid Dopplers-no evidence of focal hemodynamically significant stenosis bilaterally.  Assessment: 40 year old with slurred speech and left-sided weakness-noticed to have a right basal ganglia/internal capsule nonhemorrhagic infarction along with additional punctate infarction in multiple vascular territories-initially, seen in 2021 for a left thalamic infarction along with subtle right thalamic infarction with differentials of concomitant small vessel disease versus embolic phenomenon.  Work-up last time was initiated but completed outpatient. Prothrombin gene factor II point mutation-likely associated more with venous thrombosis and heterozygous individuals with unclear risk of VTE recurrence and not a major risk factor for arterial thrombosis per heme-onc, was recommended to continue aspirin only. Given multiple vascular territories involved with strokes again, I would definitely want to pursue a TEE and I would recommend further discussion with heme-onc regarding possible considerations for anticoagulation. At minimum, this time, would recommend dual antiplatelets.  Please see detailed recommendations below.  Soon after I saw the  patient, his COVID test also was resulted back as positive.  This also complicates the picture but the hypercoagulable work-up was done a few months ago and I doubt that that is related to any acute phase changes related to COVID infection.   Impression: -Acute ischemic stroke-embolic versus secondary to thrombophilia from a factor II prothrombin gene mutation. -?Hypercoagulable state -Diabetes  -hypertension -Coronary artery disease -COVID-19 positive  Recommendations: Admit to hospitalist Frequent neurochecks Given the abnormal factor II prothrombin gene, now more strokes with acute and subacute strokes as well as stroke in November 2021, as well as slightly abnormal anticardiolipin antibodies, would appreciate hem-onc involvemt again regarding management-would like to know opinion for anticoagulation. MR angiogram head contrast From a stroke prevention standpoint, at minimum, for now continue aspirin 81 and Plavix 75 for 3 months followed by aspirin only.  Will wait for heme-onc recommendations well regarding consideration for anticoagulation given recurrent strokes of different ages raising concern that he has had more strokes than have been clinically identified 2D echocardiogram. I would recommend a TEE as well-his COVID-19 positivity will complicate this but at this time, with recurrent strokes in a patient who is only 40 years old, I would recommend that this be done. A1c Lipid panel PT OT Speech therapy Advised smoking and marijuana cessation. Check urinary toxicology screen Management of COVID-19 infection per primary team  Discussed my plan with Dr. Karleen Hampshire over the phone I will follow.  -- Amie Portland, MD Neurologist [facility] Pager: [phone]

## 2020-08-27 NOTE — ED Notes (Signed)
ECHO at bedside.

## 2020-08-27 NOTE — Progress Notes (Signed)
*  PRELIMINARY RESULTS* Echocardiogram 2D Echocardiogram has been performed.  Martin Mullen 08/27/2020, 9:06 AM

## 2020-08-27 NOTE — ED Notes (Signed)
Pt given lunch tray.

## 2020-08-27 NOTE — ED Notes (Signed)
Patient is resting comfortably. 

## 2020-08-27 NOTE — Progress Notes (Signed)
PHARMACIST - PHYSICIAN COMMUNICATION  CONCERNING:  Enoxaparin (Lovenox) for DVT Prophylaxis    RECOMMENDATION: Patient was prescribed enoxaprin 40mg  q24 hours for VTE prophylaxis.   Filed Weights   08/26/20 2316  Weight: 115.7 kg (255 lb)    Body mass index is 36.59 kg/m.  Estimated Creatinine Clearance: 136 mL/min (by C-G formula based on SCr of 0.92 mg/dL).   Based on [facility] policy patient is candidate for enoxaparin 0.5mg /kg TBW SQ every 24 hours based on BMI being >30.  DESCRIPTION: Pharmacy has adjusted enoxaparin dose per [facility] policy.  Patient is now receiving enoxaparin 0.5 mg/kg every 24 hours   [facility], PharmD, [facility] 08/27/2020 12:16 AM
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
## 2020-08-27 NOTE — ED Notes (Signed)
Pt taken for MRI.
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
## 2020-08-28 DIAGNOSIS — I639 Cerebral infarction, unspecified: Secondary | ICD-10-CM | POA: Diagnosis not present

## 2020-08-28 DIAGNOSIS — D6852 Prothrombin gene mutation: Secondary | ICD-10-CM | POA: Diagnosis not present

## 2020-08-28 DIAGNOSIS — I2510 Atherosclerotic heart disease of native coronary artery without angina pectoris: Secondary | ICD-10-CM

## 2020-08-28 LAB — BASIC METABOLIC PANEL
Anion gap: 6 (ref 5–15)
BUN: 11 mg/dL (ref 6–20)
CO2: 26 mmol/L (ref 22–32)
Calcium: 8.7 mg/dL — ABNORMAL LOW (ref 8.9–10.3)
Chloride: 106 mmol/L (ref 98–111)
Creatinine, Ser: 0.77 mg/dL (ref 0.61–1.24)
GFR, Estimated: >60 mL/min (ref >60)
Glucose, Bld: 170 mg/dL — ABNORMAL HIGH (ref 70–99)
Potassium: 3.8 mmol/L (ref 3.5–5.1)
Sodium: 138 mmol/L (ref 135–145)

## 2020-08-28 LAB — CBC
HCT: 44.6 % (ref 39.0–52.0)
Hemoglobin: 15.8 g/dL (ref 13.0–17.0)
MCH: 32.8 pg (ref 26.0–34.0)
MCHC: 35.4 g/dL (ref 30.0–36.0)
MCV: 92.5 fL (ref 80.0–100.0)
Platelets: 230 10*3/uL (ref 150–400)
RBC: 4.82 MIL/uL (ref 4.22–5.81)
RDW: 12.7 % (ref 11.5–15.5)
WBC: 13.7 10*3/uL — ABNORMAL HIGH (ref 4.0–10.5)
nRBC: 0 % (ref 0.0–0.2)

## 2020-08-28 LAB — ECHOCARDIOGRAM COMPLETE
AR max vel: 3.4 cm2
AV Area VTI: 3.09 cm2
AV Area mean vel: 3.23 cm2
AV Mean grad: 3 mmHg
AV Peak grad: 4.2 mmHg
Ao pk vel: 1.03 m/s
Area-P 1/2: 3.77 cm2
MV VTI: 3.24 cm2
S' Lateral: 2.8 cm
Single Plane A4C EF: 51 %
Weight: 4080 oz

## 2020-08-28 LAB — GLUCOSE, CAPILLARY
Glucose-Capillary: 216 mg/dL — ABNORMAL HIGH (ref 70–99)
Glucose-Capillary: 214 mg/dL — ABNORMAL HIGH (ref 70–99)

## 2020-08-28 SURGERY — ECHOCARDIOGRAM, TRANSESOPHAGEAL
Anesthesia: Moderate Sedation

## 2020-08-28 MED ORDER — APIXABAN 5 MG PO TABS: 5.0000 mg | ORAL_TABLET | Freq: Two times a day (BID) | ORAL | 2 refills | Status: DC

## 2020-08-28 MED ORDER — SENNOSIDES-DOCUSATE SODIUM 8.6-50 MG PO TABS: 1.0000 | ORAL_TABLET | Freq: Every evening | ORAL | Status: DC | PRN

## 2020-08-28 MED ORDER — APIXABAN 5 MG PO TABS
5.0000 mg | ORAL_TABLET | Freq: Two times a day (BID) | ORAL | Status: DC
  Administered 2020-08-28: 5 mg via ORAL
  Filled 2020-08-28: qty 1

## 2020-08-28 NOTE — Progress Notes (Signed)
ANTICOAGULATION CONSULT NOTE  Pharmacy Consult for Apixaban Indication:  multiple strokes, prothrombin gene mutation.  No Known Allergies  Patient Measurements: Weight: 115.7 kg (255 lb)  Vital Signs: Temp: 98.5 F (36.9 C) (07/15 0829) BP: 141/102 (07/15 0829) Pulse Rate: 80 (07/15 0829)  Labs: Recent Labs    08/26/20 1647 08/28/20 0431  HGB 16.0 15.8  HCT 45.3 44.6  PLT 237 230  APTT 26  --   LABPROT 12.2  --   INR 0.9  --   CREATININE 0.92 0.77    Estimated Creatinine Clearance: 156.4 mL/min (by C-G formula based on SCr of 0.77 mg/dL).   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   Chicken pox    Coronary artery disease    Inferior ST elevation myocardial infarction in July of 2014. Cardiac catheterization showed 95% distal RCA stenosis and 90% first diagonal stenosis with normal ejection fraction. He underwent PCI in bare-metal stent placement to the distal RCA.   Diabetes mellitus without complication (HCC)    Hypercholesterolemia    Stroke (HCC)    Tobacco use     Assessment: 40 y.o. male with medical history significant of adjustment sorter, anxiety, depression, CAD, CVA, hyperlipidemia, diabetes, hypertension, GERD who presents with some neurologic deficits and concern for possible stroke. Pharmacy has been consulted for apixaban dosing for hx of multiple strokes, prothrombin gene mutation.   Plan:  Start apixaban 5 mg BID  Monitor CBC per protocol  Raiford Noble, PharmD Clinical Pharmacist 08/28/2020 1:32 PM

## 2020-08-28 NOTE — Progress Notes (Signed)
SUBJECTIVE: Patient covid positive. TEE to be rescheduled.    Vitals:   08/27/20 1504 08/27/20 2029 08/28/20 0504 08/28/20 0829  BP: (!) 149/97 (!) 160/96 (!) 146/92 (!) 141/102  Pulse: 85 95 82 80  Resp: 18 18 17 18   Temp: 98 F (36.7 C) 98.2 F (36.8 C) 97.7 F (36.5 C) 98.5 F (36.9 C)  SpO2: 99% 99% 95% 98%  Weight:        Intake/Output Summary (Last 24 hours) at 08/28/2020 0851 Last data filed at 08/27/2020 1751 Gross per 24 hour  Intake 360 ml  Output --  Net 360 ml    LABS: Basic Metabolic Panel: Recent Labs    08/26/20 1647 08/28/20 0431  NA 134* 138  K 4.3 3.8  CL 102 106  CO2 25 26  GLUCOSE 396* 170*  BUN 13 11  CREATININE 0.92 0.77  CALCIUM 9.1 8.7*   Liver Function Tests: Recent Labs    08/26/20 1647  AST 29  ALT 58*  ALKPHOS 102  BILITOT 0.6  PROT 7.3  ALBUMIN 3.7   No results for input(s): LIPASE, AMYLASE in the last 72 hours. CBC: Recent Labs    08/26/20 1647 08/28/20 0431  WBC 14.9* 13.7*  NEUTROABS 9.6*  --   HGB 16.0 15.8  HCT 45.3 44.6  MCV 92.6 92.5  PLT 237 230   Cardiac Enzymes: No results for input(s): CKTOTAL, CKMB, CKMBINDEX, TROPONINI in the last 72 hours. BNP: Invalid input(s): POCBNP D-Dimer: No results for input(s): DDIMER in the last 72 hours. Hemoglobin A1C: Recent Labs    08/27/20 0720  HGBA1C 9.4*   Fasting Lipid Panel: Recent Labs    08/27/20 0720  CHOL 187  HDL 26*  LDLCALC 125*  TRIG 179*  CHOLHDL 7.2   Thyroid Function Tests: No results for input(s): TSH, T4TOTAL, T3FREE, THYROIDAB in the last 72 hours.  Invalid input(s): FREET3 Anemia Panel: No results for input(s): VITAMINB12, FOLATE, FERRITIN, TIBC, IRON, RETICCTPCT in the last 72 hours.   PHYSICAL EXAM General: Well developed, well nourished, in no acute distress HEENT:  Normocephalic and atramatic Neck:  No JVD.  Lungs: Clear bilaterally to auscultation and percussion. Heart: HRRR . Normal S1 and S2 without gallops or murmurs.   Abdomen: Bowel sounds are positive, abdomen soft and non-tender  Msk:  Back normal, normal gait. Normal strength and tone for age. Extremities: No clubbing, cyanosis or edema.   Neuro: Alert and oriented X 3. Psych:  Good affect, responds appropriately  TELEMETRY: NSR, HR 80  ASSESSMENT AND PLAN: Patient to be scheduled for outpatient TEE after 10 day window of quarantine.   Principal Problem:   Acute CVA (cerebrovascular accident) [facility]) Active Problems:   STEMI s/p RCA stent 08/2012   HTN (hypertension)   HLD (hyperlipidemia)   Type 2 diabetes mellitus with hyperlipidemia (HCC)   Coronary artery disease   Stroke (HCC)    Larrissa Stivers, FNP-C 08/28/2020 8:51 AM

## 2020-08-28 NOTE — Progress Notes (Signed)
Inpatient Diabetes Program Recommendations  AACE/ADA: New Consensus Statement on Inpatient Glycemic Control (2015)  Target Ranges:  Prepandial:   less than 140 mg/dL      Peak postprandial:   less than 180 mg/dL (1-2 hours)      Critically ill patients:  140 - 180 mg/dL   Results for Martin Mullen, Martin Mullen (MRN [id number]) as of 08/28/2020 13:11  Ref. Range 08/27/2020 10:18 08/27/2020 13:08 08/27/2020 17:53 08/27/2020 20:34  Glucose-Capillary Latest Ref Range: 70 - 99 mg/dL 427 (H)  5 units NOVOLOG  192 (H) 279 (H)  8 units NOVOLOG  186 (H)  10 units Lantus   Results for Martin Mullen, Martin Mullen (MRN [id number]) as of 08/28/2020 13:11  Ref. Range 08/28/2020 08:30 08/28/2020 12:39  Glucose-Capillary Latest Ref Range: 70 - 99 mg/dL 151 (H)  5 units NOVOLOG  214 (H)      Home DM Meds: Lantus 23 units Daily      Metformin 1000 mg BID      Januvia 100 mg Daily    Current Orders: Lantus 20 units QHS     Novolog 0-15 units TID ac/hs     MD- Note Pt only received 10 units Lantus last PM b/c was NPO for TEE this AM, however, TEE has been cancelled due to COVID.   Allowed solid PO diet  Please consider the following:  1. Increase Lantus to 23 units QHS (home dose)  2. Start Novolog Meal Coverage: Novolog 4 units TID with meals Hold if pt eats <50% of meal, Hold if pt NPO    --Will follow patient during hospitalization--  Ambrose Finland RN, MSN, CDE Diabetes Coordinator Inpatient Glycemic Control Team Team Pager: [phone] (8a-5p)

## 2020-08-28 NOTE — Consult Note (Signed)
Hematology/Oncology Consult note [facility] [phone] [phone]  Patient Care Team: Lorre MunroeBaity, Regina W, NP as PCP - General (Internal Medicine) Pa, [facility]   Name of the patient: Martin JeffersonScotty Mullen  [id number]  [birth year]   Date of visit: 08/28/20 REASON FOR COSULTATION:  stroke History of presenting illness-  40 y.o. male with PMH including adjustment disorder, anxiety, depression, CAD, CVA, hyperlipidemia, diabetes, hypertension, GERD presented to emergency room for slurred speech, weakness and dizzy, bilateral lower extremity weakness for about a week.  Onset of symptoms is approximately 1 week ago,  08/26/2020 CT head showed bilateral basal ganglia lacunar type infarcts, acute or subacute.  Possible small white matter infarct near the frontal horn of the right lateral ventricle. 08/26/2020, MRI brain without contrast showed 1.2 cm acute ischemic no acute hemorrhagic right basal ganglia/internal capsule infarct.  Additional 4 mm acute ischemic nonhemorrhagic cortical infarct involving the posterior left frontal cortex.  Few additional vague subcentimeter foci of diffusion abnormality involving the left thalamocapsular region, likely subacute ischemic changes. Underlying remote lacunar infarcts involving the right corona radiata, bilateral basal ganglia, bilateral thalami, and pons/midbrain.  Ultrasound carotid artery showed no evidence of focal significant stenosis bilaterally. MR angiogram head showed no intracranial large vessel occlusion.  Moderate stenosis left cavernous carotid. Echocardiogram showed LVEF 40-45%.  No embolus  Patient has a history of stroke in 2021.  Patient had hypercoagulable work-up done at that time.  Negative for factor V Leiden mutation, normal protein C&S activity and antigen level.  Antithrombin activity 106%.  Heterozygous prothrombin gene mutation.  Slight increase in the anticardiolipin IgM-this does not  meet antiphospholipid syndrome diagnostic criteria.  Negative lupus anticoagulant.  Patient has been on aspirin 81 mg daily. Patient is everyday smoker.  Patient was seen by neurologist.  In view of multiple strokes, TEE was ordered for further evaluation however cardiology wants to defer procedure to outpatient since patient is COVID-19 positive. Hematology was consulted for recommendation of anticoagulation Patient was recommended to have a full work-up done but he wants to leave AMA.   Review of Systems  Constitutional:  Negative for appetite change, chills, fatigue, fever and unexpected weight change.  HENT:   Negative for hearing loss and voice change.   Eyes:  Negative for eye problems and icterus.  Respiratory:  Negative for chest tightness, cough and shortness of breath.   Cardiovascular:  Negative for chest pain and leg swelling.  Gastrointestinal:  Negative for abdominal distention and abdominal pain.  Endocrine: Negative for hot flashes.  Genitourinary:  Negative for difficulty urinating, dysuria and frequency.   Musculoskeletal:  Negative for arthralgias.  Skin:  Negative for itching and rash.  Neurological:  Negative for light-headedness and numbness.       SIRS patient has improved.  Hematological:  Negative for adenopathy. Does not bruise/bleed easily.  Psychiatric/Behavioral:  Negative for confusion.    No Known Allergies  Patient Active Problem List   Diagnosis Date Noted   Stroke (HCC) 08/27/2020   Acute CVA (cerebrovascular accident) (HCC) 08/26/2020   Cerebrovascular accident (CVA) (HCC)    GERD (gastroesophageal reflux disease) 07/20/2019   Coronary artery disease    Type 2 diabetes mellitus with hyperlipidemia (HCC) 07/09/2013   STEMI s/p RCA stent 08/2012 09/05/2012   HTN (hypertension) 09/05/2012   HLD (hyperlipidemia) 09/05/2012   Adjustment disorder with mixed anxiety and depressed mood 09/05/2012     Past Medical History:  Diagnosis Date   Acute  transmural inferior  wall MI (HCC) 08/28/2012   .5 x 12 mm Veri-flex stent non-DES.   Chicken pox    Coronary artery disease    Inferior ST elevation myocardial infarction in July of 2014. Cardiac catheterization showed 95% distal RCA stenosis and 90% first diagonal stenosis with normal ejection fraction. He underwent PCI in bare-metal stent placement to the distal RCA.   Diabetes mellitus without complication (HCC)    Hypercholesterolemia    Stroke (HCC)    Tobacco use      Past Surgical History:  Procedure Laterality Date   CORONARY ANGIOPLASTY WITH STENT PLACEMENT     DEBRIDEMENT AND CLOSURE WOUND N/A 09/03/2019   Procedure: Debridement and closure of scrotal wound;  Surgeon: Allena Napoleon, MD;  Location: [facility];  Service: Plastics;  Laterality: N/A;   DEBRIDEMENT AND CLOSURE WOUND N/A 07/25/2019   Procedure: DEBRIDEMENT AND CLOSURE WOUND;  Surgeon: Allena Napoleon, MD;  Location: [facility] ORS;  Service: Plastics;  Laterality: N/A;   INCISION AND DRAINAGE ABSCESS N/A 07/20/2019   Procedure: INCISION AND DRAINAGE ABSCESS;  Surgeon: Crista Elliot, MD;  Location: [facility];  Service: Urology;  Laterality: N/A;   LEFT HEART CATHETERIZATION WITH CORONARY ANGIOGRAM N/A 08/28/2012   Procedure: LEFT HEART CATHETERIZATION WITH CORONARY ANGIOGRAM;  Surgeon: Pamella Pert, MD;  Location: [facility] CATH LAB;  Service: Cardiovascular;  Laterality: N/A;   WRIST SURGERY  1988    Social History   Socioeconomic History   Marital status: Married    Spouse name: Not on file   Number of children: Not on file   Years of education: Not on file   Highest education level: Not on file  Occupational History   Not on file  Tobacco Use   Smoking status: Every Day    Packs/day: 1.00    Years: 30.00    Pack years: 30.00    Types: Cigarettes   Smokeless tobacco: Never  Vaping Use   Vaping Use: Never used  Substance and Sexual Activity   Alcohol use: Not Currently    Alcohol/week: 0.0  standard drinks    Comment: occasional   Drug use: Yes    Types: Marijuana    Comment: occ   Sexual activity: Yes    Birth control/protection: None  Other Topics Concern   Not on file  Social History Narrative   Not on file   Social Determinants of Health   Financial Resource Strain: Not on file  Food Insecurity: Not on file  Transportation Needs: Not on file  Physical Activity: Not on file  Stress: Not on file  Social Connections: Not on file  Intimate Partner Violence: Not on file     Family History  Problem Relation Age of Onset   Arthritis Mother        Rheumatoid   Diabetes Mother    Hyperlipidemia Mother    Diabetes Father    Cancer Maternal Grandfather        Prostate   Parkinson's disease Maternal Grandfather      Current Facility-Administered Medications:     stroke: mapping our early stages of recovery book, , Does not apply, Once, Synetta Fail, MD   acetaminophen (TYLENOL) tablet 650 mg, 650 mg, Oral, Q4H PRN **OR** acetaminophen (TYLENOL) 160 MG/5ML solution 650 mg, 650 mg, Per Tube, Q4H PRN **OR** acetaminophen (TYLENOL) suppository 650 mg, 650 mg, Rectal, Q4H PRN, Synetta Fail, MD   apixaban (ELIQUIS) tablet 5 mg, 5 mg, Oral, BID, Akula,  Edson Snowball, MD   aspirin EC tablet 81 mg, 81 mg, Oral, Daily, Synetta Fail, MD, 81 mg at 08/28/20 0848   atorvastatin (LIPITOR) tablet 80 mg, 80 mg, Oral, Daily, Synetta Fail, MD, 80 mg at 08/28/20 0848   insulin aspart (novoLOG) injection 0-15 Units, 0-15 Units, Subcutaneous, TID WC, Synetta Fail, MD, 5 Units at 08/28/20 0848   insulin aspart (novoLOG) injection 0-5 Units, 0-5 Units, Subcutaneous, QHS, Synetta Fail, MD, 2 Units at 08/26/20 2325   insulin glargine (LANTUS) injection 20 Units, 20 Units, Subcutaneous, QHS, Mansy, Jan A, MD, 20 Units at 08/26/20 2325   senna-docusate (Senokot-S) tablet 1 tablet, 1 tablet, Oral, QHS PRN, Synetta Fail, MD   sodium chloride flush (NS)  0.9 % injection 3 mL, 3 mL, Intravenous, Once, Synetta Fail, MD   Physical exam:  Vitals:   08/27/20 1504 08/27/20 2029 08/28/20 0504 08/28/20 0829  BP: (!) 149/97 (!) 160/96 (!) 146/92 (!) 141/102  Pulse: 85 95 82 80  Resp: 18 18 17 18   Temp: 98 F (36.7 C) 98.2 F (36.8 C) 97.7 F (36.5 C) 98.5 F (36.9 C)  SpO2: 99% 99% 95% 98%  Weight:       Physical Exam Constitutional:      General: He is not in acute distress.    Appearance: He is not diaphoretic.  HENT:     Head: Normocephalic and atraumatic.     Nose: Nose normal.     Mouth/Throat:     Pharynx: No oropharyngeal exudate.  Eyes:     General: No scleral icterus.    Pupils: Pupils are equal, round, and reactive to light.  Cardiovascular:     Rate and Rhythm: Normal rate and regular rhythm.     Heart sounds: No murmur heard. Pulmonary:     Effort: Pulmonary effort is normal. No respiratory distress.     Breath sounds: No rales.  Chest:     Chest wall: No tenderness.  Abdominal:     General: There is no distension.     Palpations: Abdomen is soft.     Tenderness: There is no abdominal tenderness.  Musculoskeletal:        General: Normal range of motion.     Cervical back: Normal range of motion and neck supple.  Skin:    General: Skin is warm and dry.     Findings: No erythema.  Neurological:     Mental Status: He is alert and oriented to person, place, and time.     Cranial Nerves: No cranial nerve deficit.     Motor: No abnormal muscle tone.     Coordination: Coordination normal.  Psychiatric:        Mood and Affect: Affect normal.        CMP Latest Ref Rng & Units 08/28/2020  Glucose 70 - 99 mg/dL 413(K)  BUN 6 - 20 mg/dL 11  Creatinine 4.40 - 1.02 mg/dL 7.25  Sodium 366 - 440 mmol/L 138  Potassium 3.5 - 5.1 mmol/L 3.8  Chloride 98 - 111 mmol/L 106  CO2 22 - 32 mmol/L 26  Calcium 8.9 - 10.3 mg/dL 3.4(V)  Total Protein 6.5 - 8.1 g/dL -  Total Bilirubin 0.3 - 1.2 mg/dL -  Alkaline Phos  38 - 126 U/L -  AST 15 - 41 U/L -  ALT 0 - 44 U/L -   CBC Latest Ref Rng & Units 08/28/2020  WBC 4.0 - 10.5 K/uL 13.7(H)  Hemoglobin  13.0 - 17.0 g/dL 29.5  Hematocrit 18.8 - 52.0 % 44.6  Platelets 150 - 400 K/uL 230    RADIOGRAPHIC STUDIES: I have personally reviewed the radiological images as listed and agreed with the findings in the report. CT HEAD WO CONTRAST  Result Date: 08/26/2020 CLINICAL DATA:  Third speech since 1 week ago. Dizziness and lightheadedness. EXAM: CT HEAD WITHOUT CONTRAST TECHNIQUE: Contiguous axial images were obtained from the base of the skull through the vertex without intravenous contrast. COMPARISON:  Head CT 06/17/2020. FINDINGS: Brain: There are bilateral basal ganglia lacunar type infarcts which appear new since the prior CT scan from May 2022. These could be acute or subacute. There is also a possible small white matter infarct near the frontal horn of the right lateral ventricle. No findings for hemorrhage. No hemispheric infarction. No intracranial mass lesions. No extra-axial fluid collections. Vascular: Age advanced atherosclerotic calcifications but no hyperdense vessels. Skull: No skull fracture or bone lesions. Sinuses/Orbits: The paranasal sinuses and mastoid air cells are clear. Other: No scalp lesions or scalp hematoma. IMPRESSION: 1. Bilateral basal ganglia lacunar type infarcts, new since the prior CT scan from May 2022. These could be acute or subacute. Recommend MRI brain with diffusion-weighted imaging for further evaluation. 2. Possible small white matter infarct near the frontal horn of the right lateral ventricle. 3. No findings for hemorrhage or hemispheric infarction. Electronically Signed   By: Rudie Meyer M.D.   On: 08/26/2020 17:23   MR ANGIO HEAD WO CONTRAST  Result Date: 08/27/2020 CLINICAL DATA:  Acute neuro deficit.  Stroke. EXAM: MRA HEAD WITHOUT CONTRAST TECHNIQUE: Angiographic images of the Circle of Willis were acquired using MRA  technique without intravenous contrast. COMPARISON:  MRI head 08/26/2020 FINDINGS: Anterior circulation: Atherosclerotic disease in the cavernous carotid bilaterally. Moderate stenosis on the left. No significant stenosis on the right. Anterior and middle cerebral arteries patent bilaterally without large vessel occlusion or flow limiting stenosis. Posterior circulation: Left vertebral artery dominant and patent to the basilar. Left PICA patent. Right vertebral artery is small and ends in PICA. Basilar widely patent. Superior cerebellar and posterior cerebral arteries patent bilaterally without stenosis or large vessel occlusion. Anatomic variants: Negative for aneurysm Other: None IMPRESSION: No intracranial large vessel occlusion. Moderate stenosis left cavernous carotid. Electronically Signed   By: Marlan Palau M.D.   On: 08/27/2020 11:00   MR BRAIN WO CONTRAST  Result Date: 08/27/2020 CLINICAL DATA:  Initial evaluation for acute slurred speech. EXAM: MRI HEAD WITHOUT CONTRAST TECHNIQUE: Multiplanar, multiecho pulse sequences of the brain and surrounding structures were obtained without intravenous contrast. COMPARISON:  Prior CT from earlier the same day. FINDINGS: Brain: Cerebral volume within normal limits for age. Remote lacunar infarcts present about the right corona radiata, bilateral basal ganglia, bilateral thalami, and pons/midbrain. 1.2 cm acute ischemic infarcts seen involving the right globus pallidus/internal capsule (series 5, image 22). There is an additional 4 mm focus of acute ischemia involving the posterior left frontal cortex (series 5, image 31). Few additional vague foci of diffusion abnormality involving the left thalamocapsular region likely reflect subacute ischemic change (series 5, images 23, 20). One of these foci is positioned immediately adjacent to a chronic left thalamic lacunar infarct. No associated hemorrhage or mass effect. No other evidence for acute or subacute  ischemia. Gray-white matter differentiation otherwise maintained. No other evidence for acute or chronic intracranial hemorrhage. No mass lesion, midline shift or mass effect. No hydrocephalus or extra-axial fluid collection. Pituitary gland suprasellar region normal.  Midline structures intact. Vascular: Major intracranial vascular flow voids are maintained. Skull and upper cervical spine: Craniocervical junction within normal limits. Bone marrow signal intensity normal. No scalp soft tissue abnormality. Sinuses/Orbits: Globes and orbital soft tissues demonstrate no acute finding. Mild scattered mucosal thickening noted within the ethmoidal air cells and maxillary sinuses. Paranasal sinuses are otherwise clear. No mastoid effusion. Other: None. IMPRESSION: 1. 1.2 cm acute ischemic nonhemorrhagic right basal ganglia/internal capsule infarct. 2. Additional 4 mm acute ischemic nonhemorrhagic cortical infarct involving the posterior left frontal cortex. 3. Few additional vague subcentimeter foci of diffusion abnormality involving the left thalamocapsular region, likely subacute ischemic changes. 4. Underlying remote lacunar infarcts involving the right corona radiata, bilateral basal ganglia, bilateral thalami, and pons/midbrain. Electronically Signed   By: Rise Mu M.D.   On: 08/27/2020 00:54   US Carotid Bilateral (at [facility] and [facility] only)  Result Date: 08/27/2020 CLINICAL DATA:  Hemorrhagic infarct in the right basal ganglia EXAM: BILATERAL CAROTID DUPLEX ULTRASOUND TECHNIQUE: Wallace Cullens scale imaging, color Doppler and duplex ultrasound were performed of bilateral carotid and vertebral arteries in the neck. COMPARISON:  MRI from the previous day, CT a of the neck from 12/22/2019. FINDINGS: Criteria: Quantification of carotid stenosis is based on velocity parameters that correlate the residual internal carotid diameter with NASCET-based stenosis levels, using the diameter of the distal internal carotid lumen as  the denominator for stenosis measurement. The following velocity measurements were obtained: RIGHT ICA: 61/23 cm/sec CCA:  97/15 cm/sec SYSTOLIC ICA/CCA RATIO:  1.2 ECA:  82 cm/sec LEFT ICA:  60/20 cm/sec CCA:  115/16 cm/sec SYSTOLIC ICA/CCA RATIO:  0.8 ECA:  78 cm/sec RIGHT CAROTID ARTERY: Examination of preliminary grayscale images demonstrates no significant atherosclerotic plaque in the carotid system on the right. The waveforms, velocities and flow velocity ratios show no evidence of focal hemodynamically significant stenosis. RIGHT VERTEBRAL ARTERY:  Antegrade in nature. LEFT CAROTID ARTERY: Preliminary grayscale images of the left carotid system demonstrate no evidence of significant atherosclerotic plaque. Waveforms, velocities and flow velocity ratios show no evidence of focal hemodynamically significant stenosis. LEFT VERTEBRAL ARTERY:  Antegrade in nature. Note is made of lymph nodes in the neck bilaterally with normal fatty hila. On the left the node measures 1 cm in short axis. Node adjacent to the right external iliac artery measures 9.6 mm in short axis. These correspond the lymph nodes seen on prior CT examination. IMPRESSION: No evidence of focal hemodynamically significant stenosis bilaterally. Small lymph nodes are seen bilaterally. These are stable from a prior CT in 2021. Electronically Signed   By: Alcide Clever M.D.   On: 08/27/2020 01:41   ECHOCARDIOGRAM COMPLETE  Result Date: 08/28/2020    ECHOCARDIOGRAM REPORT   Patient Name:   GRIMM BASSANI Date of Exam: 08/27/2020 Medical Rec #:  [id number]       Height:       70.0 in Accession #:    [id number]      Weight:       255.0 lb Date of Birth:  [birth year]       BSA:          2.314 m Patient Age:    40 years        BP:           128/78 mmHg Patient Gender: M               HR:           71 bpm. Exam Location:  [facility]  Procedure: 2D Echo, Color Doppler, Cardiac Doppler and Intracardiac            Opacification Agent Indications:     I63.9 Stroke   History:         Patient has prior history of Echocardiogram examinations. CAD;                  Risk Factors:Diabetes, HCL and Current Smoker.  Sonographer:     Humphrey Rolls RDCS (AE) Referring Phys:  [id number] Cecille Po MELVIN Diagnosing Phys: Adrian Blackwater MD  Sonographer Comments: Technically difficult study due to poor echo windows. Image acquisition challenging due to patient body habitus and Image acquisition challenging due to respiratory motion. IMPRESSIONS  1. Left ventricular ejection fraction, by estimation, is 40 to 45%. The left ventricle has mildly decreased function. The left ventricle has no regional wall motion abnormalities. There is mild concentric left ventricular hypertrophy. Left ventricular diastolic parameters are consistent with Grade I diastolic dysfunction (impaired relaxation).  2. Right ventricular systolic function is normal. The right ventricular size is normal.  3. The mitral valve is normal in structure. No evidence of mitral valve regurgitation. No evidence of mitral stenosis.  4. The aortic valve is normal in structure. Aortic valve regurgitation is not visualized. Mild aortic valve sclerosis is present, with no evidence of aortic valve stenosis.  5. The inferior vena cava is normal in size with greater than 50% respiratory variability, suggesting right atrial pressure of 3 mmHg. FINDINGS  Left Ventricle: Left ventricular ejection fraction, by estimation, is 40 to 45%. The left ventricle has mildly decreased function. The left ventricle has no regional wall motion abnormalities. Definity contrast agent was given IV to delineate the left ventricular endocardial borders. The left ventricular internal cavity size was normal in size. There is mild concentric left ventricular hypertrophy. Left ventricular diastolic parameters are consistent with Grade I diastolic dysfunction (impaired relaxation). Right Ventricle: The right ventricular size is normal. No increase in right ventricular wall  thickness. Right ventricular systolic function is normal. Left Atrium: Left atrial size was normal in size. Right Atrium: Right atrial size was normal in size. Pericardium: There is no evidence of pericardial effusion. Mitral Valve: The mitral valve is normal in structure. No evidence of mitral valve regurgitation. No evidence of mitral valve stenosis. MV peak gradient, 2.0 mmHg. The mean mitral valve gradient is 1.0 mmHg. Tricuspid Valve: The tricuspid valve is normal in structure. Tricuspid valve regurgitation is not demonstrated. No evidence of tricuspid stenosis. Aortic Valve: The aortic valve is normal in structure. Aortic valve regurgitation is not visualized. Mild aortic valve sclerosis is present, with no evidence of aortic valve stenosis. Aortic valve mean gradient measures 3.0 mmHg. Aortic valve peak gradient measures 4.2 mmHg. Aortic valve area, by VTI measures 3.09 cm. Pulmonic Valve: The pulmonic valve was normal in structure. Pulmonic valve regurgitation is not visualized. No evidence of pulmonic stenosis. Aorta: The aortic root is normal in size and structure. Venous: The inferior vena cava is normal in size with greater than 50% respiratory variability, suggesting right atrial pressure of 3 mmHg. IAS/Shunts: No atrial level shunt detected by color flow Doppler.  LEFT VENTRICLE PLAX 2D LVIDd:         3.60 cm     Diastology LVIDs:         2.80 cm     LV e' medial:    6.64 cm/s LV PW:         1.10 cm  LV E/e' medial:  9.3 LV IVS:        1.20 cm     LV e' lateral:   13.40 cm/s LVOT diam:     2.30 cm     LV E/e' lateral: 4.6 LV SV:         64 LV SV Index:   27 LVOT Area:     4.15 cm  LV Volumes (MOD) LV vol d, MOD A4C: 60.2 ml LV vol s, MOD A4C: 29.5 ml LV SV MOD A4C:     60.2 ml LEFT ATRIUM           Index LA diam:      3.90 cm 1.69 cm/m LA Vol (A4C): 21.2 ml 9.16 ml/m  AORTIC VALVE                   PULMONIC VALVE AV Area (Vmax):    3.40 cm    PV Vmax:       0.91 m/s AV Area (Vmean):   3.23 cm     PV Vmean:      64.400 cm/s AV Area (VTI):     3.09 cm    PV VTI:        0.173 m AV Vmax:           103.00 cm/s PV Peak grad:  3.3 mmHg AV Vmean:          78.400 cm/s PV Mean grad:  2.0 mmHg AV VTI:            0.206 m AV Peak Grad:      4.2 mmHg AV Mean Grad:      3.0 mmHg LVOT Vmax:         84.20 cm/s LVOT Vmean:        60.900 cm/s LVOT VTI:          0.153 m LVOT/AV VTI ratio: 0.74  AORTA Ao Root diam: 4.00 cm MITRAL VALVE MV Area (PHT): 3.77 cm    SHUNTS MV Area VTI:   3.24 cm    Systemic VTI:  0.15 m MV Peak grad:  2.0 mmHg    Systemic Diam: 2.30 cm MV Mean grad:  1.0 mmHg MV Vmax:       0.71 m/s MV Vmean:      47.7 cm/s MV Decel Time: 201 msec MV E velocity: 61.70 cm/s MV A velocity: 60.40 cm/s MV E/A ratio:  1.02 Adrian Blackwater MD Electronically signed by Adrian Blackwater MD Signature Date/Time: 08/28/2020/8:56:21 AM    Final     Assessment and plan  #Recurrent stroke Patient has prothrombin gene heterozygous mutation which by itself is not a major risk factor for arterial thrombosis.  Estimated venous thrombosis risk is about a 3-4 fold. Patient has multiple other risk factors including uncontrolled diabetes, history of MI, hypertension, smoking, recent COVID infection which all potentially contribute to his recurrent stroke. I agree with work-up of TEE to rule out cardiac source of embolus. Patient developed recurrent stroke despite being on antiplatelet therapy aspirin.  I recommend patient to start on anticoagulation with Eliquis 5 mg twice daily along with aspirin. Discussed with neurology, from neurology perspective, he is not a very high risk of hemorrhagic transformation given that symptom has been ongoing for many days.  Risk of bleeding was discussed with patient.  Reviewed red flag symptoms.  He agrees with the plan. Recommend smoke cessation, life star modification. Patient will follow-up with me outpatient in 2 to 3 weeks.  Thank you for allowing me to participate in the care of this  patient.   Rickard Patience, MD, PhD Hematology Oncology [facility] Pager- [phone] 08/28/2020

## 2020-08-28 NOTE — Progress Notes (Signed)
Chart review progress note  Labs LDL - 125 A1c - 9.4  Imaging and diagnsotics:  MRI examination of the brain-shows acute ischemic nonhemorrhagic right basal ganglia/internal capsular infarct.  Additionally there is a 4 mm acute ischemic nonhemorrhagic cortical infarct involving the posterior left frontal cortex.  Few additional vague subcentimeter foci of diffusion abnormality involving the left thalamic capsular region likely subacute ischemic changes seen on diffusion-weighted imaging.  Underlying remote lacunar infarctions along the right corona radiata and basal ganglia as well as bilateral thalami and pons and midbrain are also seen.  MRA head  No intracranial large vessel occlusion. Moderate stenosis left cavernous carotid.  Carotid dopplers No evidence of focal hemodynamically significant stenosis bilaterally. Small lymph nodes are seen bilaterally. These are stable from a prior CT in 2021.  2D Echo: LVEF 40 to 45%.  No regional wall motion abnormalities.  Mild concentric LVH.  Grade 1 diastolic dysfunction.  No shunts.  Normal LA size.  Normal mitral valve.  Normal aortic valve.   Assessment and plan: -Acute ischemic strokes in multiple vascular territories-concerning for embolic etiology. -2D echo findings of reduced ejection fraction are new-EF of 40 to 45%-I would recommend formal cardiology consultation.  TEE has been put on hold due to his current COVID-positive status -Discussed with hematology-from hematological standpoint, anticoagulation should be fine.  From a neurological perspective, he is not at very high risk for hemorrhagic transformation and symptoms have been ongoing for many days.  I am fine with Eliquis 5 mg twice daily. -If for some reason decision is made not to anticoagulate, then he should be on dual antiplatelets-aspirin 81 and Plavix 75 for 3 months. -High intensity statin for goal LDL less than 70 -Current E0F 9.4.  Medical management of diabetes for goal  A1c of less than 7. -Outpatient neurology follow-up and cardiology follow-up for TEE.  Discussed my plan with Drs. Holland Falling on secure chat  -- Milon Dikes, MD Neurologist [facility] Pager: [phone]

## 2020-08-28 NOTE — Discharge Summary (Signed)
Physician Discharge Summary  Martin Mullen BCW:[id number] DOB: 1980/10/20 DOA: 08/26/2020  PCP: [name], NP  Admit date: 08/26/2020 Discharge date: 08/28/2020  Admitted From: Home.  Disposition:  Home.   Recommendations for Outpatient Follow-up:  Follow up with PCP in 1-2 weeks Please obtain BMP/CBC in one week Please follow up with cardiology for TEE  Please follow up with Hematology- Oncology as recommended.  Please follow up with neurology as recommended.   Discharge Condition:stable.  CODE STATUS:full code.  Diet recommendation: Heart Healthy / Carb Modified   Brief/Interim Summary:  Martin Mullen is a 40 y.o. male with medical history significant of adjustment sorter, anxiety, depression, CAD, CVA, hyperlipidemia, diabetes, hypertension, GERD who presents with some neurologic deficits and concern for possible stroke. Patient has history of prior CVA as above and has had some deficits in the past 5 days , with dizziness and leg weakness. Pt was seen in November  for bithalamic strokes,full work up was recommended, but pt left AMA. His work up so far showed factor II prothrombin gene mutation.  CT head showed acute or subacute infarcts new from previous CT in May. MRI brain without contrast showed multiple infarcts. Neurology consulted. Hem- oncology consulted to see if he needs anti coagulation.    Discharge Diagnoses:  Principal Problem:   Acute CVA (cerebrovascular accident) [facility]) Active Problems:   STEMI s/p RCA stent 08/2012   HTN (hypertension)   HLD (hyperlipidemia)   Type 2 diabetes mellitus with hyperlipidemia (HCC)   Coronary artery disease   Stroke ([place])  Acute ischemic stroke, right basal ganglia stroke, acute ischemic nonhemorrhagic cortical infarct involving the posterior left frontal cortex. In the setting of prothrombin gene point mutation. He was initially started on aspirin and plavix. Heme onc consulted to see if he needs anti coagulation, after  discussing with Dr Talbert Cage, and Dr Rory Percy we have discharged pt  on aspirin 81 mg and Eliquis 5 mg BID.  LDL is 125, and A1c is 9.4. MRA shows No intracranial large vessel occlusion. Moderate stenosis left cavernous carotid. Carotid duplex No evidence of focal hemodynamically significant stenosis bilaterally. Echocardiogram showed LVef of 45% , and grade 1 diastolic dysfunction.  In view of multiple strokes, TEE ordered for further evaluation. But cardiology deferred TEE to outpatient since he is COVID 19 positive.        Hypertension: Well controlled.resume home meds.      Uncontrolled DM with hyperglycemia:  CBG (last 3)  Recent Labs    08/27/20 2034 08/28/20 0830 08/28/20 1239  GLUCAP 186* 216* 214*   A1c is 9.7%. Restarted on home meds on discharge.      Hyperlipidemia: Continue with statin.       Tobacco abuse: On nicotine patch.     CAD S/p RCA STENT. Pt currently denies any chest pain or sob.     COVID 19 infection;  Pt is currently asymptomatic. refused IV remdesevir or any anti viral meds.     Discharge Instructions  Discharge Instructions     Diet - low sodium heart healthy   Complete by: As directed    Discharge instructions   Complete by: As directed    Please follow up with Hem- Oncology as recommended. Please follow upwith neurology as outpatient in 2 to 4 weeks.   Increase activity slowly   Complete by: As directed       Allergies as of 08/28/2020   No Known Allergies      Medication List  STOP taking these medications    cyclobenzaprine 10 MG tablet Commonly known as: FLEXERIL   Insulin Pen Needle 31G X 8 MM Misc   losartan 50 MG tablet Commonly known as: COZAAR   naproxen 500 MG tablet Commonly known as: NAPROSYN   sitaGLIPtin 100 MG tablet Commonly known as: Januvia       TAKE these medications    apixaban 5 MG Tabs tablet Commonly known as: ELIQUIS Take 1 tablet (5 mg total) by mouth 2 (two) times daily.    aspirin 81 MG EC tablet Take 1 tablet (81 mg total) by mouth daily.   atorvastatin 80 MG tablet Commonly known as: LIPITOR Take 80 mg by mouth daily.   Architect w/Device Kit 1 Device   Contour Next Test test strip Generic drug: glucose blood USE 1 STRIP TO CHECK GLUCOSE THREE TIMES DAILY AS NEEDED   Lantus SoloStar 100 UNIT/ML Solostar Pen Generic drug: insulin glargine INJECT 23 UNITS UNDER THE SKIN EVERY DAY   metFORMIN 1000 MG tablet Commonly known as: GLUCOPHAGE TAKE 1 TABLET(1000 MG) BY MOUTH TWICE DAILY WITH A MEAL   nitroGLYCERIN 0.4 MG SL tablet Commonly known as: NITROSTAT DISSOLVE ONE TABLET UNDER THE TONGUE EVERY 5 MINUTES AS NEEDED FOR CHEST PAIN.  DO NOT EXCEED A TOTAL OF 3 DOSES IN 15 MINUTES   senna-docusate 8.6-50 MG tablet Commonly known as: Senokot-S Take 1 tablet by mouth at bedtime as needed for mild constipation or moderate constipation.        Follow-up Information     [name], NP. Schedule an appointment as soon as possible for a visit in 1 week(s).   Specialties: Internal Medicine, Emergency Medicine Contact information: [place] [address] [place]. Schedule an appointment as soon as possible for a visit in 2 week(s).   Contact information: [place] [phone]        Earlie Server, MD. Schedule an appointment as soon as possible for a visit in 1 week(s).   Specialty: Oncology Contact information: [place] [address] [phone]                No Known Allergies  Consultations: Hem oncology Cardiology Neurology.    Procedures/Studies: CT HEAD WO CONTRAST  Result Date: 08/26/2020 CLINICAL DATA:  Third speech since 1 week ago. Dizziness and lightheadedness. EXAM: CT HEAD WITHOUT CONTRAST TECHNIQUE: Contiguous axial images were obtained from the  base of the skull through the vertex without intravenous contrast. COMPARISON:  Head CT 06/17/2020. FINDINGS: Brain: There are bilateral basal ganglia lacunar type infarcts which appear new since the prior CT scan from May 2022. These could be acute or subacute. There is also a possible small white matter infarct near the frontal horn of the right lateral ventricle. No findings for hemorrhage. No hemispheric infarction. No intracranial mass lesions. No extra-axial fluid collections. Vascular: Age advanced atherosclerotic calcifications but no hyperdense vessels. Skull: No skull fracture or bone lesions. Sinuses/Orbits: The paranasal sinuses and mastoid air cells are clear. Other: No scalp lesions or scalp hematoma. IMPRESSION: 1. Bilateral basal ganglia lacunar type infarcts, new since the prior CT scan from May 2022. These could be acute or subacute. Recommend MRI brain with diffusion-weighted imaging for further evaluation. 2. Possible small white matter infarct near the frontal horn of the right lateral ventricle. 3. No findings  for hemorrhage or hemispheric infarction. Electronically Signed   By: Marijo Sanes M.D.   On: 08/26/2020 17:23   MR ANGIO HEAD WO CONTRAST  Result Date: 08/27/2020 CLINICAL DATA:  Acute neuro deficit.  Stroke. EXAM: MRA HEAD WITHOUT CONTRAST TECHNIQUE: Angiographic images of the Circle of Willis were acquired using MRA technique without intravenous contrast. COMPARISON:  MRI head 08/26/2020 FINDINGS: Anterior circulation: Atherosclerotic disease in the cavernous carotid bilaterally. Moderate stenosis on the left. No significant stenosis on the right. Anterior and middle cerebral arteries patent bilaterally without large vessel occlusion or flow limiting stenosis. Posterior circulation: Left vertebral artery dominant and patent to the basilar. Left PICA patent. Right vertebral artery is small and ends in PICA. Basilar widely patent. Superior cerebellar and posterior cerebral arteries  patent bilaterally without stenosis or large vessel occlusion. Anatomic variants: Negative for aneurysm Other: None IMPRESSION: No intracranial large vessel occlusion. Moderate stenosis left cavernous carotid. Electronically Signed   By: Franchot Gallo M.D.   On: 08/27/2020 11:00   MR BRAIN WO CONTRAST  Result Date: 08/27/2020 CLINICAL DATA:  Initial evaluation for acute slurred speech. EXAM: MRI HEAD WITHOUT CONTRAST TECHNIQUE: Multiplanar, multiecho pulse sequences of the brain and surrounding structures were obtained without intravenous contrast. COMPARISON:  Prior CT from earlier the same day. FINDINGS: Brain: Cerebral volume within normal limits for age. Remote lacunar infarcts present about the right corona radiata, bilateral basal ganglia, bilateral thalami, and pons/midbrain. 1.2 cm acute ischemic infarcts seen involving the right globus pallidus/internal capsule (series 5, image 22). There is an additional 4 mm focus of acute ischemia involving the posterior left frontal cortex (series 5, image 31). Few additional vague foci of diffusion abnormality involving the left thalamocapsular region likely reflect subacute ischemic change (series 5, images 23, 20). One of these foci is positioned immediately adjacent to a chronic left thalamic lacunar infarct. No associated hemorrhage or mass effect. No other evidence for acute or subacute ischemia. Gray-white matter differentiation otherwise maintained. No other evidence for acute or chronic intracranial hemorrhage. No mass lesion, midline shift or mass effect. No hydrocephalus or extra-axial fluid collection. Pituitary gland suprasellar region normal. Midline structures intact. Vascular: Major intracranial vascular flow voids are maintained. Skull and upper cervical spine: Craniocervical junction within normal limits. Bone marrow signal intensity normal. No scalp soft tissue abnormality. Sinuses/Orbits: Globes and orbital soft tissues demonstrate no acute  finding. Mild scattered mucosal thickening noted within the ethmoidal air cells and maxillary sinuses. Paranasal sinuses are otherwise clear. No mastoid effusion. Other: None. IMPRESSION: 1. 1.2 cm acute ischemic nonhemorrhagic right basal ganglia/internal capsule infarct. 2. Additional 4 mm acute ischemic nonhemorrhagic cortical infarct involving the posterior left frontal cortex. 3. Few additional vague subcentimeter foci of diffusion abnormality involving the left thalamocapsular region, likely subacute ischemic changes. 4. Underlying remote lacunar infarcts involving the right corona radiata, bilateral basal ganglia, bilateral thalami, and pons/midbrain. Electronically Signed   By: Jeannine Boga M.D.   On: 08/27/2020 00:54   US Carotid Bilateral (at [facility] and [facility] only)  Result Date: 08/27/2020 CLINICAL DATA:  Hemorrhagic infarct in the right basal ganglia EXAM: BILATERAL CAROTID DUPLEX ULTRASOUND TECHNIQUE: Pearline Cables scale imaging, color Doppler and duplex ultrasound were performed of bilateral carotid and vertebral arteries in the neck. COMPARISON:  MRI from the previous day, CT a of the neck from 12/22/2019. FINDINGS: Criteria: Quantification of carotid stenosis is based on velocity parameters that correlate the residual internal carotid diameter with NASCET-based stenosis levels, using the diameter of the distal internal  carotid lumen as the denominator for stenosis measurement. The following velocity measurements were obtained: RIGHT ICA: 61/23 cm/sec CCA:  20/35 cm/sec SYSTOLIC ICA/CCA RATIO:  1.2 ECA:  82 cm/sec LEFT ICA:  60/20 cm/sec CCA:  597/41 cm/sec SYSTOLIC ICA/CCA RATIO:  0.8 ECA:  78 cm/sec RIGHT CAROTID ARTERY: Examination of preliminary grayscale images demonstrates no significant atherosclerotic plaque in the carotid system on the right. The waveforms, velocities and flow velocity ratios show no evidence of focal hemodynamically significant stenosis. RIGHT VERTEBRAL ARTERY:  Antegrade in  nature. LEFT CAROTID ARTERY: Preliminary grayscale images of the left carotid system demonstrate no evidence of significant atherosclerotic plaque. Waveforms, velocities and flow velocity ratios show no evidence of focal hemodynamically significant stenosis. LEFT VERTEBRAL ARTERY:  Antegrade in nature. Note is made of lymph nodes in the neck bilaterally with normal fatty hila. On the left the node measures 1 cm in short axis. Node adjacent to the right external iliac artery measures 9.6 mm in short axis. These correspond the lymph nodes seen on prior CT examination. IMPRESSION: No evidence of focal hemodynamically significant stenosis bilaterally. Small lymph nodes are seen bilaterally. These are stable from a prior CT in 2021. Electronically Signed   By: Inez Catalina M.D.   On: 08/27/2020 01:41   ECHOCARDIOGRAM COMPLETE  Result Date: 08/28/2020    ECHOCARDIOGRAM REPORT   Patient Name:   Martin Mullen Date of Exam: 08/27/2020 Medical Rec #:  [id number]       Height:       70.0 in Accession #:    [id number]      Weight:       255.0 lb Date of Birth:  1980/08/15       BSA:          2.314 m Patient Age:    40 years        BP:           128/78 mmHg Patient Gender: M               HR:           71 bpm. Exam Location:  [facility] Procedure: 2D Echo, Color Doppler, Cardiac Doppler and Intracardiac            Opacification Agent Indications:     I63.9 Stroke  History:         Patient has prior history of Echocardiogram examinations. CAD;                  Risk Factors:Diabetes, HCL and Current Smoker.  Sonographer:     Charmayne Sheer RDCS (AE) Referring Phys:  [id number] [place] Diagnosing Phys: Neoma Laming MD  Sonographer Comments: Technically difficult study due to poor echo windows. Image acquisition challenging due to patient body habitus and Image acquisition challenging due to respiratory motion. IMPRESSIONS  1. Left ventricular ejection fraction, by estimation, is 40 to 45%. The left ventricle has mildly decreased  function. The left ventricle has no regional wall motion abnormalities. There is mild concentric left ventricular hypertrophy. Left ventricular diastolic parameters are consistent with Grade I diastolic dysfunction (impaired relaxation).  2. Right ventricular systolic function is normal. The right ventricular size is normal.  3. The mitral valve is normal in structure. No evidence of mitral valve regurgitation. No evidence of mitral stenosis.  4. The aortic valve is normal in structure. Aortic valve regurgitation is not visualized. Mild aortic valve sclerosis is present, with no evidence of aortic valve stenosis.  5. The inferior vena cava is normal in size with greater than 50% respiratory variability, suggesting right atrial pressure of 3 mmHg. FINDINGS  Left Ventricle: Left ventricular ejection fraction, by estimation, is 40 to 45%. The left ventricle has mildly decreased function. The left ventricle has no regional wall motion abnormalities. Definity contrast agent was given IV to delineate the left ventricular endocardial borders. The left ventricular internal cavity size was normal in size. There is mild concentric left ventricular hypertrophy. Left ventricular diastolic parameters are consistent with Grade I diastolic dysfunction (impaired relaxation). Right Ventricle: The right ventricular size is normal. No increase in right ventricular wall thickness. Right ventricular systolic function is normal. Left Atrium: Left atrial size was normal in size. Right Atrium: Right atrial size was normal in size. Pericardium: There is no evidence of pericardial effusion. Mitral Valve: The mitral valve is normal in structure. No evidence of mitral valve regurgitation. No evidence of mitral valve stenosis. MV peak gradient, 2.0 mmHg. The mean mitral valve gradient is 1.0 mmHg. Tricuspid Valve: The tricuspid valve is normal in structure. Tricuspid valve regurgitation is not demonstrated. No evidence of tricuspid stenosis.  Aortic Valve: The aortic valve is normal in structure. Aortic valve regurgitation is not visualized. Mild aortic valve sclerosis is present, with no evidence of aortic valve stenosis. Aortic valve mean gradient measures 3.0 mmHg. Aortic valve peak gradient measures 4.2 mmHg. Aortic valve area, by VTI measures 3.09 cm. Pulmonic Valve: The pulmonic valve was normal in structure. Pulmonic valve regurgitation is not visualized. No evidence of pulmonic stenosis. Aorta: The aortic root is normal in size and structure. Venous: The inferior vena cava is normal in size with greater than 50% respiratory variability, suggesting right atrial pressure of 3 mmHg. IAS/Shunts: No atrial level shunt detected by color flow Doppler.  LEFT VENTRICLE PLAX 2D LVIDd:         3.60 cm     Diastology LVIDs:         2.80 cm     LV e' medial:    6.64 cm/s LV PW:         1.10 cm     LV E/e' medial:  9.3 LV IVS:        1.20 cm     LV e' lateral:   13.40 cm/s LVOT diam:     2.30 cm     LV E/e' lateral: 4.6 LV SV:         64 LV SV Index:   27 LVOT Area:     4.15 cm  LV Volumes (MOD) LV vol d, MOD A4C: 60.2 ml LV vol s, MOD A4C: 29.5 ml LV SV MOD A4C:     60.2 ml LEFT ATRIUM           Index LA diam:      3.90 cm 1.69 cm/m LA Vol (A4C): 21.2 ml 9.16 ml/m  AORTIC VALVE                   PULMONIC VALVE AV Area (Vmax):    3.40 cm    PV Vmax:       0.91 m/s AV Area (Vmean):   3.23 cm    PV Vmean:      64.400 cm/s AV Area (VTI):     3.09 cm    PV VTI:        0.173 m AV Vmax:           103.00 cm/s PV Peak grad:  3.3 mmHg AV Vmean:          78.400 cm/s PV Mean grad:  2.0 mmHg AV VTI:            0.206 m AV Peak Grad:      4.2 mmHg AV Mean Grad:      3.0 mmHg LVOT Vmax:         84.20 cm/s LVOT Vmean:        60.900 cm/s LVOT VTI:          0.153 m LVOT/AV VTI ratio: 0.74  AORTA Ao Root diam: 4.00 cm MITRAL VALVE MV Area (PHT): 3.77 cm    SHUNTS MV Area VTI:   3.24 cm    Systemic VTI:  0.15 m MV Peak grad:  2.0 mmHg    Systemic Diam: 2.30 cm MV Mean  grad:  1.0 mmHg MV Vmax:       0.71 m/s MV Vmean:      47.7 cm/s MV Decel Time: 201 msec MV E velocity: 61.70 cm/s MV A velocity: 60.40 cm/s MV E/A ratio:  1.02 Neoma Laming MD Electronically signed by Neoma Laming MD Signature Date/Time: 08/28/2020/8:56:21 AM    Final       Subjective: Adamant about going home. No chest pain or sob.   Discharge Exam: Vitals:   08/28/20 0504 08/28/20 0829  BP: (!) 146/92 (!) 141/102  Pulse: 82 80  Resp: 17 18  Temp: 97.7 F (36.5 C) 98.5 F (36.9 C)  SpO2: 95% 98%   Vitals:   08/27/20 1504 08/27/20 2029 08/28/20 0504 08/28/20 0829  BP: (!) 149/97 (!) 160/96 (!) 146/92 (!) 141/102  Pulse: 85 95 82 80  Resp: 18 18 17 18   Temp: 98 F (36.7 C) 98.2 F (36.8 C) 97.7 F (36.5 C) 98.5 F (36.9 C)  SpO2: 99% 99% 95% 98%  Weight:        General: Pt is alert, awake, not in acute distress Cardiovascular: RRR, S1/S2 +, no rubs, no gallops Respiratory: CTA bilaterally, no wheezing, no rhonchi Abdominal: Soft, NT, ND, bowel sounds + Extremities: no edema, no cyanosis    The results of significant diagnostics from this hospitalization (including imaging, microbiology, ancillary and laboratory) are listed below for reference.     Microbiology: Recent Results (from the past 240 hour(s))  SARS CORONAVIRUS 2 (TAT 6-24 HRS) Nasopharyngeal Nasopharyngeal Swab     Status: Abnormal   Collection Time: 08/26/20  9:37 PM   Specimen: Nasopharyngeal Swab  Result Value Ref Range Status   SARS Coronavirus 2 POSITIVE (A) NEGATIVE Final    Comment: (NOTE) SARS-CoV-2 target nucleic acids are DETECTED.  The SARS-CoV-2 RNA is generally detectable in upper and lower respiratory specimens during the acute phase of infection. Positive results are indicative of the presence of SARS-CoV-2 RNA. Clinical correlation with patient history and other diagnostic information is  necessary to determine patient infection status. Positive results do not rule out bacterial  infection or co-infection with other viruses.  The expected result is Negative.  Fact Sheet for Patients: SugarRoll.be  Fact Sheet for Healthcare Providers: https://www.woods-mathews.com/  This test is not yet approved or cleared by the [place] FDA and  has been authorized for detection and/or diagnosis of SARS-CoV-2 by FDA under an Emergency Use Authorization (EUA). This EUA will remain  in effect (meaning this test can be used) for the duration of the COVID-19 declaration under Section 564(b)(1) of the Act, 21 U. S.C. section 360bbb-3(b)(1), unless the authorization is  terminated or revoked sooner.   Performed at [facility], [place] [address], [place], [place] [address]      Labs: BNP (last 3 results) No results for input(s): BNP in the last 8760 hours. Basic Metabolic Panel: Recent Labs  Lab 08/26/20 1647 08/28/20 0431  NA 134* 138  K 4.3 3.8  CL 102 106  CO2 25 26  GLUCOSE 396* 170*  BUN 13 11  CREATININE 0.92 0.77  CALCIUM 9.1 8.7*   Liver Function Tests: Recent Labs  Lab 08/26/20 1647  AST 29  ALT 58*  ALKPHOS 102  BILITOT 0.6  PROT 7.3  ALBUMIN 3.7   No results for input(s): LIPASE, AMYLASE in the last 168 hours. No results for input(s): AMMONIA in the last 168 hours. CBC: Recent Labs  Lab 08/26/20 1647 08/28/20 0431  WBC 14.9* 13.7*  NEUTROABS 9.6*  --   HGB 16.0 15.8  HCT 45.3 44.6  MCV 92.6 92.5  PLT 237 230   Cardiac Enzymes: No results for input(s): CKTOTAL, CKMB, CKMBINDEX, TROPONINI in the last 168 hours. BNP: Invalid input(s): POCBNP CBG: Recent Labs  Lab 08/27/20 1308 08/27/20 1753 08/27/20 2034 08/28/20 0830 08/28/20 1239  GLUCAP 192* 279* 186* 216* 214*   D-Dimer No results for input(s): DDIMER in the last 72 hours. Hgb A1c Recent Labs    08/27/20 0720  HGBA1C 9.4*   Lipid Profile Recent Labs    08/27/20 0720  CHOL 187  HDL 26*  LDLCALC 125*  TRIG 179*   CHOLHDL 7.2   Thyroid function studies No results for input(s): TSH, T4TOTAL, T3FREE, THYROIDAB in the last 72 hours.  Invalid input(s): FREET3 Anemia work up No results for input(s): VITAMINB12, FOLATE, FERRITIN, TIBC, IRON, RETICCTPCT in the last 72 hours. Urinalysis    Component Value Date/Time   COLORURINE YELLOW (A) 07/20/2019 1059   APPEARANCEUR CLEAR (A) 07/20/2019 1059   LABSPEC 1.017 07/20/2019 1059   PHURINE 6.0 07/20/2019 1059   GLUCOSEU >=500 (A) 07/20/2019 1059   HGBUR SMALL (A) 07/20/2019 1059   BILIRUBINUR NEGATIVE 07/20/2019 1059   KETONESUR 20 (A) 07/20/2019 1059   PROTEINUR NEGATIVE 07/20/2019 1059   NITRITE NEGATIVE 07/20/2019 1059   LEUKOCYTESUR NEGATIVE 07/20/2019 1059   Sepsis Labs Invalid input(s): PROCALCITONIN,  WBC,  LACTICIDVEN Microbiology Recent Results (from the past 240 hour(s))  SARS CORONAVIRUS 2 (TAT 6-24 HRS) Nasopharyngeal Nasopharyngeal Swab     Status: Abnormal   Collection Time: 08/26/20  9:37 PM   Specimen: Nasopharyngeal Swab  Result Value Ref Range Status   SARS Coronavirus 2 POSITIVE (A) NEGATIVE Final    Comment: (NOTE) SARS-CoV-2 target nucleic acids are DETECTED.  The SARS-CoV-2 RNA is generally detectable in upper and lower respiratory specimens during the acute phase of infection. Positive results are indicative of the presence of SARS-CoV-2 RNA. Clinical correlation with patient history and other diagnostic information is  necessary to determine patient infection status. Positive results do not rule out bacterial infection or co-infection with other viruses.  The expected result is Negative.  Fact Sheet for Patients: SugarRoll.be  Fact Sheet for Healthcare Providers: https://www.woods-mathews.com/  This test is not yet approved or cleared by the [place] FDA and  has been authorized for detection and/or diagnosis of SARS-CoV-2 by FDA under an Emergency Use Authorization  (EUA). This EUA will remain  in effect (meaning this test can be used) for the duration of the COVID-19 declaration under Section 564(b)(1) of the Act, 21 U. S.C. section 360bbb-3(b)(1), unless  the authorization is terminated or revoked sooner.   Performed at [facility], [place] [address], [place], [place] [address]      Time coordinating discharge: 42 minutes  SIGNED:   Hosie Poisson, MD  [facility] 08/28/2020, 2:28 PM

## 2020-09-01 ENCOUNTER — Telehealth: Payer: Self-pay

## 2020-09-01 ENCOUNTER — Encounter: Payer: BC Managed Care – PPO | Admitting: Physical Therapy

## 2020-09-01 NOTE — Telephone Encounter (Signed)
09/01/2020 Appt made for 8/3 @ 2:15. Called pt and he confirmed date and time SRW

## 2020-09-01 NOTE — Telephone Encounter (Signed)
Please schedule patient as requested by MD and notify pt of appt.  

## 2020-09-01 NOTE — Telephone Encounter (Signed)
-----   Message from Rickard Patience, MD sent at 08/28/2020  9:37 PM EDT ----- Please schedule him to see me in 2-3 weeks- 15 min follow up. MD only

## 2020-09-02 ENCOUNTER — Telehealth: Payer: Self-pay | Admitting: Oncology

## 2020-09-02 ENCOUNTER — Ambulatory Visit: Payer: BC Managed Care – PPO | Admitting: Internal Medicine

## 2020-09-02 ENCOUNTER — Encounter: Payer: Self-pay | Admitting: Internal Medicine

## 2020-09-02 ENCOUNTER — Other Ambulatory Visit: Payer: Self-pay

## 2020-09-02 VITALS — BP 126/84 | HR 99 | Temp 97.5°F | Resp 17 | Ht 70.0 in | Wt 249.6 lb

## 2020-09-02 DIAGNOSIS — I2510 Atherosclerotic heart disease of native coronary artery without angina pectoris: Secondary | ICD-10-CM

## 2020-09-02 DIAGNOSIS — E785 Hyperlipidemia, unspecified: Secondary | ICD-10-CM

## 2020-09-02 DIAGNOSIS — E782 Mixed hyperlipidemia: Secondary | ICD-10-CM | POA: Diagnosis not present

## 2020-09-02 DIAGNOSIS — E1159 Type 2 diabetes mellitus with other circulatory complications: Secondary | ICD-10-CM | POA: Diagnosis not present

## 2020-09-02 DIAGNOSIS — I639 Cerebral infarction, unspecified: Secondary | ICD-10-CM | POA: Diagnosis not present

## 2020-09-02 DIAGNOSIS — E1142 Type 2 diabetes mellitus with diabetic polyneuropathy: Secondary | ICD-10-CM | POA: Diagnosis not present

## 2020-09-02 DIAGNOSIS — U071 COVID-19: Secondary | ICD-10-CM

## 2020-09-02 DIAGNOSIS — E1169 Type 2 diabetes mellitus with other specified complication: Secondary | ICD-10-CM | POA: Diagnosis not present

## 2020-09-02 DIAGNOSIS — Z8616 Personal history of COVID-19: Secondary | ICD-10-CM | POA: Diagnosis not present

## 2020-09-02 DIAGNOSIS — D6852 Prothrombin gene mutation: Secondary | ICD-10-CM

## 2020-09-02 LAB — CBC
HCT: 52 % — ABNORMAL HIGH (ref 38.5–50.0)
Hemoglobin: 16.9 g/dL (ref 13.2–17.1)
MCH: 31.1 pg (ref 27.0–33.0)
MCHC: 32.5 g/dL (ref 32.0–36.0)
MCV: 95.8 fL (ref 80.0–100.0)
MPV: 10.3 fL (ref 7.5–12.5)
Platelets: 269 10*3/uL (ref 140–400)
RBC: 5.43 10*6/uL (ref 4.20–5.80)
RDW: 12.8 % (ref 11.0–15.0)
WBC: 13 10*3/uL — ABNORMAL HIGH (ref 3.8–10.8)

## 2020-09-02 LAB — BASIC METABOLIC PANEL WITH GFR
BUN: 11 mg/dL (ref 7–25)
CO2: 28 mmol/L (ref 20–32)
Calcium: 9.5 mg/dL (ref 8.6–10.3)
Chloride: 101 mmol/L (ref 98–110)
Creat: 1 mg/dL (ref 0.60–1.29)
Glucose, Bld: 247 mg/dL — ABNORMAL HIGH (ref 65–99)
Potassium: 4.7 mmol/L (ref 3.5–5.3)
Sodium: 137 mmol/L (ref 135–146)
eGFR: 98 mL/min/{1.73_m2} (ref >=60)

## 2020-09-02 NOTE — Telephone Encounter (Signed)
Patient was seen in ER for a stroke. His friend asked for an appt for [place]. He is already established with Dr Yu. Next appt is scheduled for 8/3. Didn't know he he would need to be seen sooner. 

## 2020-09-02 NOTE — Patient Instructions (Signed)
Stroke Prevention Some medical conditions and behaviors are associated with a higher chance of having a stroke. You can help prevent a stroke by making nutrition, lifestyle, and other changes, including managing any medical conditions you may have. What nutrition changes can be made?  Eat healthy foods. You can do this by: Choosing foods high in fiber, such as fresh fruits and vegetables and whole grains. Eating at least 5 or more servings of fruits and vegetables a day. Try to fill half of your plate at each meal with fruits and vegetables. Choosing lean protein foods, such as lean cuts of meat, poultry without skin, fish, tofu, beans, and nuts. Eating low-fat dairy products. Avoiding foods that are high in salt (sodium). This can help lower blood pressure. Avoiding foods that have saturated fat, trans fat, and cholesterol. This can help prevent high cholesterol. Avoiding processed and premade foods. Follow your health care provider's specific guidelines for losing weight, controlling high blood pressure (hypertension), lowering high cholesterol, and managing diabetes. These may include: Reducing your daily calorie intake. Limiting your daily sodium intake to 1,500 milligrams (mg). Using only healthy fats for cooking, such as olive oil, canola oil, or sunflower oil. Counting your daily carbohydrate intake. What lifestyle changes can be made? Maintain a healthy weight. Talk to your health care provider about your ideal weight. Get at least 30 minutes of moderate physical activity at least 5 days a week. Moderate activity includes brisk walking, biking, and swimming. Do not use any products that contain nicotine or tobacco, such as cigarettes and e-cigarettes. If you need help quitting, ask your health care provider. It may also be helpful to avoid exposure to secondhand smoke. Limit alcohol intake to no more than 1 drink a day for nonpregnant women and 2 drinks a day for men. One drink equals 12  oz of beer, 5 oz of wine, or 1 oz of hard liquor. Stop any illegal drug use. Avoid taking birth control pills. Talk to your health care provider about the risks of taking birth control pills if: You are over 35 years old. You smoke. You get migraines. You have ever had a blood clot. What other changes can be made? Manage your cholesterol levels. Eating a healthy diet is important for preventing high cholesterol. If cholesterol cannot be managed through diet alone, you may also need to take medicines. Take any prescribed medicines to control your cholesterol as told by your health care provider. Manage your diabetes. Eating a healthy diet and exercising regularly are important parts of managing your blood sugar. If your blood sugar cannot be managed through diet and exercise, you may need to take medicines. Take any prescribed medicines to control your diabetes as told by your health care provider. Control your hypertension. To reduce your risk of stroke, try to keep your blood pressure below 130/80. Eating a healthy diet and exercising regularly are an important part of controlling your blood pressure. If your blood pressure cannot be managed through diet and exercise, you may need to take medicines. Take any prescribed medicines to control hypertension as told by your health care provider. Ask your health care provider if you should monitor your blood pressure at home. Have your blood pressure checked every year, even if your blood pressure is normal. Blood pressure increases with age and some medical conditions. Get evaluated for sleep disorders (sleep apnea). Talk to your health care provider about getting a sleep evaluation if you snore a lot or have excessive sleepiness. Take over-the-counter   and prescription medicines only as told by your health care provider. Aspirin or blood thinners (antiplatelets or anticoagulants) may be recommended to reduce your risk of forming blood clots that can  lead to stroke. Make sure that any other medical conditions you have, such as atrial fibrillation or atherosclerosis, are managed. What are the warning signs of a stroke? The warning signs of a stroke can be easily remembered as BEFAST. B is for balance. Signs include: Dizziness. Loss of balance or coordination. Sudden trouble walking. E is for eyes. Signs include: A sudden change in vision. Trouble seeing. F is for face. Signs include: Sudden weakness or numbness of the face. The face or eyelid drooping to one side. A is for arms. Signs include: Sudden weakness or numbness of the arm, usually on one side of the body. S is for speech. Signs include: Trouble speaking (aphasia). Trouble understanding. T is for time. These symptoms may represent a serious problem that is an emergency. Do not wait to see if the symptoms will go away. Get medical help right away. Call your local emergency services (911 in the [place] Do not drive yourself to the hospital. Other signs of stroke may include: A sudden, severe headache with no known cause. Nausea or vomiting. Seizure. Where to find more information For more information, visit: American Stroke Association: www.strokeassociation.org National Stroke Association: www.stroke.org Summary You can prevent a stroke by eating healthy, exercising, not smoking, limiting alcohol intake, and managing any medical conditions you may have. Do not use any products that contain nicotine or tobacco, such as cigarettes and e-cigarettes. If you need help quitting, ask your health care provider. It may also be helpful to avoid exposure to secondhand smoke. Remember BEFAST for warning signs of stroke. Get help right away if you or a loved one has any of these signs. This information is not intended to replace advice given to you by your health care provider. Make sure you discuss any questions you have with your health care provider. Document Revised: 01/13/2017  Document Reviewed: 03/08/2016 Elsevier Patient Education  2021 Elsevier Inc.  

## 2020-09-02 NOTE — Progress Notes (Signed)
Subjective:    Patient ID: Martin Mullen, male    DOB: [birth year], 40 y.o.   MRN: [id number]  HPI  Pt presents to the clinic today for hospital follow up. He went to the ER 7/13 with c/o dizziness and leg weakness x 5 days . He has a history of previous strokes. 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 CT head showed acute/subacute new infarcts in the right basal ganglia and involving the posterior left frontal cortex. MRI brain showed multiple infarcts. MRA showed no intracranial occlusion but did show moderate stenosis of his left carotid. Carotid ultrasound was unremarkable.  Echo showed and EF of 45% and grade 1 diastolic dysfunction.  TEE was ordered to be done as an outpatient. His LDL was 125, A1C 9.4%. He did test positive for Covid but declined IV antiviral. Neurology and hematology/oncology were consulted. He was started on ASA and Plavix, eventually discharged on ASA and Eliquis. He was discharged on 7/15, advised  to follow up with PCP, cardiology, hematology/oncology and neurology. Since discharge, his wife reports intermittent confusion but he denies dizziness, vision changes, chest pain or SOB. He has noticed some difficulty with his gait. He is taking ASA, Eliquis, Atorvastatin, Lantus and Metformin as prescribed. He has an appt with hematology 8/3. He has not set up an appt with cardiology or neurology.  Review of Systems  Past Medical History:  Diagnosis Date   Acute transmural inferior wall MI ([place]) 08/28/2012   .5 x 12 mm Veri-flex stent non-DES.   Chicken pox    Coronary artery disease    Inferior ST elevation myocardial infarction in July of 2014. Cardiac catheterization showed 95% distal RCA stenosis and 90% first diagonal stenosis with normal ejection fraction. He underwent PCI in bare-metal stent placement to the distal RCA.   Diabetes mellitus without complication (HCC)    Hypercholesterolemia    Stroke ([place])    Tobacco use     Current Outpatient  Medications  Medication Sig Dispense Refill   apixaban (ELIQUIS) 5 MG TABS tablet Take 1 tablet (5 mg total) by mouth 2 (two) times daily. 60 tablet 2   aspirin 81 MG EC tablet Take 1 tablet (81 mg total) by mouth daily. 30 tablet 4   atorvastatin (LIPITOR) 80 MG tablet Take 80 mg by mouth daily.     Blood Glucose Monitoring Suppl (BAYER CONTOUR NEXT MONITOR) w/Device KIT 1 Device 1 kit 0   CONTOUR NEXT TEST test strip USE 1 STRIP TO CHECK GLUCOSE THREE TIMES DAILY AS NEEDED 100 each 0   insulin glargine (LANTUS SOLOSTAR) 100 UNIT/ML Solostar Pen INJECT 23 UNITS UNDER THE SKIN EVERY DAY 15 mL 1   metFORMIN (GLUCOPHAGE) 1000 MG tablet TAKE 1 TABLET(1000 MG) BY MOUTH TWICE DAILY WITH A MEAL 180 tablet 0   nitroGLYCERIN (NITROSTAT) 0.4 MG SL tablet DISSOLVE ONE TABLET UNDER THE TONGUE EVERY 5 MINUTES AS NEEDED FOR CHEST PAIN.  DO NOT EXCEED A TOTAL OF 3 DOSES IN 15 MINUTES 25 tablet 2   senna-docusate (SENOKOT-S) 8.6-50 MG tablet Take 1 tablet by mouth at bedtime as needed for mild constipation or moderate constipation.     No current facility-administered medications for this visit.    No Known Allergies  Family History  Problem Relation Age of Onset   Arthritis Mother        Rheumatoid   Diabetes Mother    Hyperlipidemia Mother    Diabetes Father    Cancer Maternal Grandfather  Prostate   Parkinson's disease Maternal Grandfather     Social History   Socioeconomic History   Marital status: Married    Spouse name: Not on file   Number of children: Not on file   Years of education: Not on file   Highest education level: Not on file  Occupational History   Not on file  Tobacco Use   Smoking status: Every Day    Packs/day: 1.00    Years: 30.00    Pack years: 30.00    Types: Cigarettes   Smokeless tobacco: Never  Vaping Use   Vaping Use: Never used  Substance and Sexual Activity   Alcohol use: Not Currently    Alcohol/week: 0.0 standard drinks    Comment: occasional    Drug use: Yes    Types: Marijuana    Comment: occ   Sexual activity: Yes    Birth control/protection: None  Other Topics Concern   Not on file  Social History Narrative   Not on file   Social Determinants of Health   Financial Resource Strain: Not on file  Food Insecurity: Not on file  Transportation Needs: Not on file  Physical Activity: Not on file  Stress: Not on file  Social Connections: Not on file  Intimate Partner Violence: Not on file     Constitutional: Denies fever, malaise, fatigue, headache or abrupt weight changes.  HEENT: Denies eye pain, eye redness, ear pain, ringing in the ears, wax buildup, runny nose, nasal congestion, bloody nose, or sore throat. Respiratory: Denies difficulty breathing, shortness of breath, cough or sputum production.   Cardiovascular: Denies chest pain, chest tightness, palpitations or swelling in the hands or feet.  Gastrointestinal: Denies abdominal pain, bloating, constipation, diarrhea or blood in the stool.  GU: Denies urgency, frequency, pain with urination, burning sensation, blood in urine, odor or discharge. Musculoskeletal: Pt reports left sided weakness. Denies decrease in range of motion, difficulty with gait, muscle pain or joint pain and swelling.  Skin: Denies redness, rashes, lesions or ulcercations.  Neurological: Pt reports difficulty with balanced Denies dizziness, difficulty with memory, difficulty with speech or problems with coordination.  Psych: Denies anxiety, depression, SI/HI.  No other specific complaints in a complete review of systems (except as listed in HPI above).     Objective:   Physical Exam  BP 126/84 (BP Location: Right Arm, Patient Position: Sitting, Cuff Size: Large)   Pulse 99   Temp (!) 97.5 F (36.4 C) (Temporal)   Resp 17   Ht 5' 10"  (1.778 m)   Wt 249 lb 9.6 oz (113.2 kg)   SpO2 98%   BMI 35.81 kg/m   Wt Readings from Last 3 Encounters:  08/26/20 255 lb (115.7 kg)  07/01/20 257 lb  (116.6 kg)  06/25/20 257 lb (116.6 kg)    General: Appears his stated age, obese, in NAD. Skin: Warm, dry and intact.  HEENT: Head: normal shape and size; Eyes: sclera white and EOMs intact;  Cardiovascular: Normal rate and rhythm. S1,S2 noted.  No murmur, rubs or gallops noted. No JVD or BLE edema. No carotid bruits noted. Pulmonary/Chest: Normal effort and positive vesicular breath sounds. No respiratory distress. No wheezes, rales or ronchi noted.  Musculoskeletal: Strength 5/5 BUE, strength 4/5 LLE, 5/5 RLE. Gait slow but steady without device. Neurological: Alert and oriented. He has left side facial droop (mild). He has has decreased coordination bilaterally with finger to nose touch. Psychiatric: Mood and affect normal. Behavior is normal. Judgment and thought  content normal.    BMET    Component Value Date/Time   NA 138 08/28/2020 0431   NA 140 11/13/2019 1550   NA 139 01/29/2013 0040   K 3.8 08/28/2020 0431   K 4.0 01/29/2013 0040   CL 106 08/28/2020 0431   CL 107 01/29/2013 0040   CO2 26 08/28/2020 0431   CO2 28 01/29/2013 0040   GLUCOSE 170 (H) 08/28/2020 0431   GLUCOSE 228 (H) 01/29/2013 0040   BUN 11 08/28/2020 0431   BUN 10 11/13/2019 1550   BUN 15 01/29/2013 0040   CREATININE 0.77 08/28/2020 0431   CREATININE 0.94 07/01/2020 0824   CALCIUM 8.7 (L) 08/28/2020 0431   CALCIUM 9.2 01/29/2013 0040   GFRNONAA >60 08/28/2020 0431   GFRNONAA >60 01/29/2013 0040   GFRAA 104 11/13/2019 1550   GFRAA >60 01/29/2013 0040    Lipid Panel     Component Value Date/Time   CHOL 187 08/27/2020 0720   CHOL 133 11/13/2019 1550   TRIG 179 (H) 08/27/2020 0720   HDL 26 (L) 08/27/2020 0720   HDL 24 (L) 11/13/2019 1550   CHOLHDL 7.2 08/27/2020 0720   VLDL 36 08/27/2020 0720   LDLCALC 125 (H) 08/27/2020 0720   LDLCALC 105 (H) 07/01/2020 0824    CBC    Component Value Date/Time   WBC 13.7 (H) 08/28/2020 0431   RBC 4.82 08/28/2020 0431   HGB 15.8 08/28/2020 0431   HGB  16.9 11/13/2019 1550   HCT 44.6 08/28/2020 0431   HCT 49.9 11/13/2019 1550   PLT 230 08/28/2020 0431   PLT 271 11/13/2019 1550   MCV 92.5 08/28/2020 0431   MCV 93 11/13/2019 1550   MCV 93 01/29/2013 0040   MCH 32.8 08/28/2020 0431   MCHC 35.4 08/28/2020 0431   RDW 12.7 08/28/2020 0431   RDW 13.6 11/13/2019 1550   RDW 13.8 01/29/2013 0040   LYMPHSABS 4.0 08/26/2020 1647   MONOABS 0.9 08/26/2020 1647   EOSABS 0.3 08/26/2020 1647   BASOSABS 0.1 08/26/2020 1647    Hgb A1C Lab Results  Component Value Date   HGBA1C 9.4 (H) 08/27/2020            Assessment & Plan:   Hospital Follow Up for Recurrent Strokes, HLD with CAD s/p STEMI, HTN, DM 2 and Covid 19:  Hospital notes, labs and imaging reviewed Medications reviewed, will continue current meds for now CBC and BMET today Referral to endocrinology placed for diabetes management He has an appt with hematology scheduled 8/3 Advised him to schedule appts with cardiology and neurology Encouraged smoking cessation He is considering applying for disability- I agree with this decision  Return precautions discsussed Webb Silversmith, NP This visit occurred during the SARS-CoV-2 public health emergency.  Safety protocols were in place, including screening questions prior to the visit, additional usage of staff PPE, and extensive cleaning of exam room while observing appropriate contact time as indicated for disinfecting solutions.

## 2020-09-02 NOTE — Telephone Encounter (Signed)
Patient was seen in ER for a stroke. His friend asked for an appt for [place]. He is already established with Dr Cathie Hoops. Next appt is scheduled for 8/3. Didn't know he he would need to be seen sooner.

## 2020-09-02 NOTE — Assessment & Plan Note (Signed)
Will follow up with hematology

## 2020-09-03 ENCOUNTER — Encounter: Payer: Self-pay | Admitting: Internal Medicine

## 2020-09-03 ENCOUNTER — Encounter: Payer: BC Managed Care – PPO | Admitting: Physical Therapy

## 2020-09-08 ENCOUNTER — Encounter: Payer: BC Managed Care – PPO | Admitting: Physical Therapy

## 2020-09-09 DIAGNOSIS — E1169 Type 2 diabetes mellitus with other specified complication: Secondary | ICD-10-CM | POA: Diagnosis not present

## 2020-09-09 DIAGNOSIS — I252 Old myocardial infarction: Secondary | ICD-10-CM | POA: Diagnosis not present

## 2020-09-09 DIAGNOSIS — I639 Cerebral infarction, unspecified: Secondary | ICD-10-CM | POA: Diagnosis not present

## 2020-09-09 DIAGNOSIS — E1159 Type 2 diabetes mellitus with other circulatory complications: Secondary | ICD-10-CM | POA: Diagnosis not present

## 2020-09-10 ENCOUNTER — Encounter: Payer: BC Managed Care – PPO | Admitting: Physical Therapy

## 2020-09-10 DIAGNOSIS — Z7689 Persons encountering health services in other specified circumstances: Secondary | ICD-10-CM | POA: Diagnosis not present

## 2020-09-16 ENCOUNTER — Inpatient Hospital Stay: Payer: BC Managed Care – PPO | Attending: Oncology | Admitting: Oncology

## 2020-09-16 ENCOUNTER — Encounter: Payer: Self-pay | Admitting: Oncology

## 2020-09-16 ENCOUNTER — Other Ambulatory Visit: Payer: Self-pay

## 2020-09-16 VITALS — BP 138/84 | Temp 97.4°F | Resp 18 | Wt 247.7 lb

## 2020-09-16 DIAGNOSIS — F17210 Nicotine dependence, cigarettes, uncomplicated: Secondary | ICD-10-CM | POA: Insufficient documentation

## 2020-09-16 DIAGNOSIS — Z7982 Long term (current) use of aspirin: Secondary | ICD-10-CM | POA: Diagnosis not present

## 2020-09-16 DIAGNOSIS — E1165 Type 2 diabetes mellitus with hyperglycemia: Secondary | ICD-10-CM | POA: Diagnosis not present

## 2020-09-16 DIAGNOSIS — I252 Old myocardial infarction: Secondary | ICD-10-CM | POA: Insufficient documentation

## 2020-09-16 DIAGNOSIS — D6852 Prothrombin gene mutation: Secondary | ICD-10-CM | POA: Insufficient documentation

## 2020-09-16 DIAGNOSIS — Z8673 Personal history of transient ischemic attack (TIA), and cerebral infarction without residual deficits: Secondary | ICD-10-CM | POA: Diagnosis not present

## 2020-09-16 DIAGNOSIS — Z7902 Long term (current) use of antithrombotics/antiplatelets: Secondary | ICD-10-CM | POA: Diagnosis not present

## 2020-09-16 DIAGNOSIS — I639 Cerebral infarction, unspecified: Secondary | ICD-10-CM

## 2020-09-16 DIAGNOSIS — I119 Hypertensive heart disease without heart failure: Secondary | ICD-10-CM | POA: Diagnosis not present

## 2020-09-16 DIAGNOSIS — Z794 Long term (current) use of insulin: Secondary | ICD-10-CM | POA: Diagnosis not present

## 2020-09-16 DIAGNOSIS — Z7901 Long term (current) use of anticoagulants: Secondary | ICD-10-CM | POA: Diagnosis not present

## 2020-09-16 DIAGNOSIS — Z79899 Other long term (current) drug therapy: Secondary | ICD-10-CM | POA: Diagnosis not present

## 2020-09-16 NOTE — Progress Notes (Signed)
Pt here for follow up. No new concerns voiced.   

## 2020-09-18 NOTE — Progress Notes (Signed)
Hematology/Oncology Consult note [facility] [phone] [phone]   Patient Care Team: [name], NP as PCP - General (Internal Medicine) [place], [facility]  REFERRING PROVIDER: [name], NP  CHIEF COMPLAINTS/REASON FOR VISIT:  Recurrent Stroke, heterozygous prothrombin gene mutation.  HISTORY OF PRESENTING ILLNESS:   Martin Mullen is a  40 y.o.  male with PMH listed below was seen in consultation at the request of  [name], NP  for evaluation of prothrombin gene mutation  November 2021, patient had stroke involving left thalamus and right thalamus in the right frontal white matter.  Patient has risk factors including diabetes, hypertension, hyperlipidemia, history of MI, history of substance use, smoking.  MRI Brain 12/21/2019 - 1. Acute infarct in the left thalamus. Small acute infarct right thalamus and right inferior frontal white matter. 2. 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 3. No intracranial large vessel occlusion. There is atherosclerotic disease in the superior cerebellar artery bilaterally.   CT Angio of head and neck: 12/22/2019 - 1. No intracranial large vessel occlusion or proximal high-grade arterial stenosis. 2. Atherosclerotic plaque within both intracranial internal carotid arteries. Up to moderate stenosis within the distal cavernous/paraclinoid left ICA. No more than mild stenosis of the intracranial right ICA. 3. Atherosclerotic irregularity of the distal right PCA. 4. 1-2 mm vascular protrusion arising from the paraclinoid right ICA, which may reflect an infundibulum or small aneurysm.   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  Slight increase in anticardiolipin IgM-14-does not meet  antiphospholipid syndrome diagnostic criteria., negative lupus anticoagulant  INTERVAL HISTORY Martin Mullen is a 40 y.o. male who has above history reviewed by me today presents for follow up visit for management of Recurrent Stroke, heterozygous prothrombin gene mutation. 08/26/2020- 08/28/2020 He was admitted due to [place] 19 infection and recurrent stroke 08/26/2020 CT head showed bilateral basal ganglia lacunar type infarcts, acute or subacute.  Possible small white matter infarct near the frontal horn of the right lateral ventricle. 08/26/2020, MRI brain without contrast showed 1.2 cm acute ischemic no acute hemorrhagic right basal ganglia/internal capsule infarct.  Additional 4 mm acute ischemic nonhemorrhagic cortical infarct involving the posterior left frontal cortex.  Few additional vague subcentimeter foci of diffusion abnormality involving the left thalamocapsular region, likely subacute ischemic changes. Underlying remote lacunar infarcts involving the right corona radiata, bilateral basal ganglia, bilateral thalami, and pons/midbrain.   Ultrasound carotid artery showed no evidence of focal significant stenosis bilaterally. MR angiogram head showed no intracranial large vessel occlusion.  Moderate stenosis left cavernous carotid. Echocardiogram showed LVEF 40-45%.  No embolus  After discussed with neurology, patient was started on Eliquis 48m BID for anticoagulation and was discharged.   Today he presents for follow up. No new neurological symptoms.   Review of Systems  Constitutional:  Negative for appetite change, chills, fatigue, fever and unexpected weight change.  HENT:   Negative for hearing loss and voice change.   Eyes:  Negative for eye problems and icterus.  Respiratory:  Negative for chest tightness, cough and shortness of breath.   Cardiovascular:  Negative for chest pain and leg swelling.  Gastrointestinal:  Negative for abdominal distention and abdominal pain.   Endocrine: Negative for hot flashes.  Genitourinary:  Negative for difficulty urinating, dysuria and frequency.   Musculoskeletal:  Negative for  arthralgias.  Skin:  Negative for itching and rash.  Neurological:  Negative for light-headedness and numbness.  Hematological:  Negative for adenopathy. Does not bruise/bleed easily.  Psychiatric/Behavioral:  Negative for confusion.    MEDICAL HISTORY:  Past Medical History:  Diagnosis Date   Acute transmural inferior wall MI ([place]) 08/28/2012   .5 x 12 mm Veri-flex stent non-DES.   Chicken pox    Coronary artery disease    Inferior ST elevation myocardial infarction in July of 2014. Cardiac catheterization showed 95% distal RCA stenosis and 90% first diagonal stenosis with normal ejection fraction. He underwent PCI in bare-metal stent placement to the distal RCA.   Diabetes mellitus without complication ([place])    Hypercholesterolemia    Stroke ([place])    Tobacco use     SURGICAL HISTORY: Past Surgical History:  Procedure Laterality Date   CORONARY ANGIOPLASTY WITH STENT PLACEMENT     DEBRIDEMENT AND CLOSURE WOUND N/A 09/03/2019   Procedure: Debridement and closure of scrotal wound;  Surgeon: Cindra Presume, MD;  Location: [place];  Service: Plastics;  Laterality: N/A;   DEBRIDEMENT AND CLOSURE WOUND N/A 07/25/2019   Procedure: DEBRIDEMENT AND CLOSURE WOUND;  Surgeon: Cindra Presume, MD;  Location: [facility];  Service: Plastics;  Laterality: N/A;   INCISION AND DRAINAGE ABSCESS N/A 07/20/2019   Procedure: INCISION AND DRAINAGE ABSCESS;  Surgeon: Lucas Mallow, MD;  Location: [facility];  Service: Urology;  Laterality: N/A;   LEFT HEART CATHETERIZATION WITH CORONARY ANGIOGRAM N/A 08/28/2012   Procedure: LEFT HEART CATHETERIZATION WITH CORONARY ANGIOGRAM;  Surgeon: Laverda Page, MD;  Location: [facility] CATH LAB;  Service: Cardiovascular;  Laterality: N/A;   WRIST SURGERY  1988    SOCIAL HISTORY: Social History    Socioeconomic History   Marital status: Married    Spouse name: Not on file   Number of children: Not on file   Years of education: Not on file   Highest education level: Not on file  Occupational History   Not on file  Tobacco Use   Smoking status: Every Day    Packs/day: 1.00    Years: 30.00    Pack years: 30.00    Types: Cigarettes   Smokeless tobacco: Never  Vaping Use   Vaping Use: Never used  Substance and Sexual Activity   Alcohol use: Not Currently    Alcohol/week: 0.0 standard drinks    Comment: occasional   Drug use: Yes    Types: Marijuana    Comment: occ   Sexual activity: Yes    Birth control/protection: None  Other Topics Concern   Not on file  Social History Narrative   Not on file   Social Determinants of Health   Financial Resource Strain: Not on file  Food Insecurity: Not on file  Transportation Needs: Not on file  Physical Activity: Not on file  Stress: Not on file  Social Connections: Not on file  Intimate Partner Violence: Not on file    FAMILY HISTORY: Family History  Problem Relation Age of Onset   Arthritis Mother        Rheumatoid   Diabetes Mother    Hyperlipidemia Mother    Diabetes Father    Cancer Maternal Grandfather        Prostate   Parkinson's disease Maternal Grandfather     ALLERGIES:  has No Known Allergies.  MEDICATIONS:  Current Outpatient Medications  Medication Sig Dispense Refill   apixaban (ELIQUIS) 5 MG TABS  tablet Take 1 tablet (5 mg total) by mouth 2 (two) times daily. 60 tablet 2   aspirin 81 MG EC tablet Take 1 tablet (81 mg total) by mouth daily. 30 tablet 4   atorvastatin (LIPITOR) 80 MG tablet Take 80 mg by mouth daily.     Blood Glucose Monitoring Suppl (BAYER CONTOUR NEXT MONITOR) w/Device KIT 1 Device 1 kit 0   CONTOUR NEXT TEST test strip USE 1 STRIP TO CHECK GLUCOSE THREE TIMES DAILY AS NEEDED 100 each 0   insulin glargine (LANTUS SOLOSTAR) 100 UNIT/ML Solostar Pen INJECT 23 UNITS UNDER THE  SKIN EVERY DAY 15 mL 1   metFORMIN (GLUCOPHAGE) 1000 MG tablet TAKE 1 TABLET(1000 MG) BY MOUTH TWICE DAILY WITH A MEAL 180 tablet 0   nitroGLYCERIN (NITROSTAT) 0.4 MG SL tablet DISSOLVE ONE TABLET UNDER THE TONGUE EVERY 5 MINUTES AS NEEDED FOR CHEST PAIN.  DO NOT EXCEED A TOTAL OF 3 DOSES IN 15 MINUTES (Patient not taking: Reported on 09/16/2020) 25 tablet 2   No current facility-administered medications for this visit.     PHYSICAL EXAMINATION: ECOG PERFORMANCE STATUS: 0 - Asymptomatic Vitals:   09/16/20 1412  BP: 138/84  Resp: 18  Temp: (!) 97.4 F (36.3 C)   Filed Weights   09/16/20 1412  Weight: 247 lb 11.2 oz (112.4 kg)    Physical Exam Constitutional:      General: He is not in acute distress. HENT:     Head: Normocephalic and atraumatic.  Eyes:     General: No scleral icterus. Cardiovascular:     Rate and Rhythm: Normal rate and regular rhythm.     Heart sounds: Normal heart sounds.  Pulmonary:     Effort: Pulmonary effort is normal. No respiratory distress.     Breath sounds: No wheezing.  Abdominal:     General: Bowel sounds are normal. There is no distension.     Palpations: Abdomen is soft.  Musculoskeletal:        General: No deformity. Normal range of motion.     Cervical back: Normal range of motion and neck supple.  Skin:    General: Skin is warm and dry.     Findings: No erythema or rash.  Neurological:     Mental Status: He is alert and oriented to person, place, and time. Mental status is at baseline.     Cranial Nerves: No cranial nerve deficit.     Coordination: Coordination normal.  Psychiatric:        Mood and Affect: Mood normal.    LABORATORY DATA:  I have reviewed the data as listed Lab Results  Component Value Date   WBC 13.0 (H) 09/02/2020   HGB 16.9 09/02/2020   HCT 52.0 (H) 09/02/2020   MCV 95.8 09/02/2020   PLT 269 09/02/2020   Recent Labs    11/13/19 1550 11/13/19 1550 12/21/19 1606 04/02/20 1013 07/01/20 0824  08/26/20 1647 08/28/20 0431 09/02/20 1100  NA 140   < > 139 134* 138 134* 138 137  K 4.0  --  3.7 4.3 4.4 4.3 3.8 4.7  CL 101  --  102 101 103 102 106 101  CO2 24  --  24 28 22 25 26 28   GLUCOSE 191*   < > 184* 305* 210* 396* 170* 247*  BUN 10   < > 12 12 15 13 11 11   CREATININE 1.04  --  1.03 0.98 0.94 0.92 0.77 1.00  CALCIUM 9.4  --  8.9  8.9 9.3 9.1 8.7* 9.5  GFRNONAA 90  --  >60  --   --  >60 >60  --   GFRAA 104  --   --   --   --   --   --   --   PROT 7.4   < > 7.1 7.0 6.7 7.3  --   --   ALBUMIN 4.6  --  3.9 4.0  --  3.7  --   --   AST 23  --  30 18 17 29   --   --   ALT 53*  --  42 27 30 58*  --   --   ALKPHOS 149*  --  99 118*  --  102  --   --   BILITOT 0.3   < > 0.5 0.4 0.3 0.6  --   --    < > = values in this interval not displayed.    Iron/TIBC/Ferritin/ %Sat No results found for: IRON, TIBC, FERRITIN, IRONPCTSAT    RADIOGRAPHIC STUDIES: I have personally reviewed the radiological images as listed and agreed with the findings in the report. CT HEAD WO CONTRAST  Result Date: 08/26/2020 CLINICAL DATA:  Third speech since 1 week ago. Dizziness and lightheadedness. EXAM: CT HEAD WITHOUT CONTRAST TECHNIQUE: Contiguous axial images were obtained from the base of the skull through the vertex without intravenous contrast. COMPARISON:  Head CT 06/17/2020. FINDINGS: Brain: There are bilateral basal ganglia lacunar type infarcts which appear new since the prior CT scan from May 2022. These could be acute or subacute. There is also a possible small white matter infarct near the frontal horn of the right lateral ventricle. No findings for hemorrhage. No hemispheric infarction. No intracranial mass lesions. No extra-axial fluid collections. Vascular: Age advanced atherosclerotic calcifications but no hyperdense vessels. Skull: No skull fracture or bone lesions. Sinuses/Orbits: The paranasal sinuses and mastoid air cells are clear. Other: No scalp lesions or scalp hematoma. IMPRESSION: 1.  Bilateral basal ganglia lacunar type infarcts, new since the prior CT scan from May 2022. These could be acute or subacute. Recommend MRI brain with diffusion-weighted imaging for further evaluation. 2. Possible small white matter infarct near the frontal horn of the right lateral ventricle. 3. No findings for hemorrhage or hemispheric infarction. Electronically Signed   By: Marijo Sanes M.D.   On: 08/26/2020 17:23   MR ANGIO HEAD WO CONTRAST  Result Date: 08/27/2020 CLINICAL DATA:  Acute neuro deficit.  Stroke. EXAM: MRA HEAD WITHOUT CONTRAST TECHNIQUE: Angiographic images of the Circle of Willis were acquired using MRA technique without intravenous contrast. COMPARISON:  MRI head 08/26/2020 FINDINGS: Anterior circulation: Atherosclerotic disease in the cavernous carotid bilaterally. Moderate stenosis on the left. No significant stenosis on the right. Anterior and middle cerebral arteries patent bilaterally without large vessel occlusion or flow limiting stenosis. Posterior circulation: Left vertebral artery dominant and patent to the basilar. Left PICA patent. Right vertebral artery is small and ends in PICA. Basilar widely patent. Superior cerebellar and posterior cerebral arteries patent bilaterally without stenosis or large vessel occlusion. Anatomic variants: Negative for aneurysm Other: None IMPRESSION: No intracranial large vessel occlusion. Moderate stenosis left cavernous carotid. Electronically Signed   By: Franchot Gallo M.D.   On: 08/27/2020 11:00   MR BRAIN WO CONTRAST  Result Date: 08/27/2020 CLINICAL DATA:  Initial evaluation for acute slurred speech. EXAM: MRI HEAD WITHOUT CONTRAST TECHNIQUE: Multiplanar, multiecho pulse sequences of the brain and surrounding structures were obtained without intravenous contrast. COMPARISON:  Prior CT from earlier the same day. FINDINGS: Brain: Cerebral volume within normal limits for age. Remote lacunar infarcts present about the right corona radiata,  bilateral basal ganglia, bilateral thalami, and pons/midbrain. 1.2 cm acute ischemic infarcts seen involving the right globus pallidus/internal capsule (series 5, image 22). There is an additional 4 mm focus of acute ischemia involving the posterior left frontal cortex (series 5, image 31). Few additional vague foci of diffusion abnormality involving the left thalamocapsular region likely reflect subacute ischemic change (series 5, images 23, 20). One of these foci is positioned immediately adjacent to a chronic left thalamic lacunar infarct. No associated hemorrhage or mass effect. No other evidence for acute or subacute ischemia. Gray-white matter differentiation otherwise maintained. No other evidence for acute or chronic intracranial hemorrhage. No mass lesion, midline shift or mass effect. No hydrocephalus or extra-axial fluid collection. Pituitary gland suprasellar region normal. Midline structures intact. Vascular: Major intracranial vascular flow voids are maintained. Skull and upper cervical spine: Craniocervical junction within normal limits. Bone marrow signal intensity normal. No scalp soft tissue abnormality. Sinuses/Orbits: Globes and orbital soft tissues demonstrate no acute finding. Mild scattered mucosal thickening noted within the ethmoidal air cells and maxillary sinuses. Paranasal sinuses are otherwise clear. No mastoid effusion. Other: None. IMPRESSION: 1. 1.2 cm acute ischemic nonhemorrhagic right basal ganglia/internal capsule infarct. 2. Additional 4 mm acute ischemic nonhemorrhagic cortical infarct involving the posterior left frontal cortex. 3. Few additional vague subcentimeter foci of diffusion abnormality involving the left thalamocapsular region, likely subacute ischemic changes. 4. Underlying remote lacunar infarcts involving the right corona radiata, bilateral basal ganglia, bilateral thalami, and pons/midbrain. Electronically Signed   By: Jeannine Boga M.D.   On: 08/27/2020  00:54   US Carotid Bilateral (at [facility] and [facility] only)  Result Date: 08/27/2020 CLINICAL DATA:  Hemorrhagic infarct in the right basal ganglia EXAM: BILATERAL CAROTID DUPLEX ULTRASOUND TECHNIQUE: Pearline Cables scale imaging, color Doppler and duplex ultrasound were performed of bilateral carotid and vertebral arteries in the neck. COMPARISON:  MRI from the previous day, CT a of the neck from 12/22/2019. FINDINGS: Criteria: Quantification of carotid stenosis is based on velocity parameters that correlate the residual internal carotid diameter with NASCET-based stenosis levels, using the diameter of the distal internal carotid lumen as the denominator for stenosis measurement. The following velocity measurements were obtained: RIGHT ICA: 61/23 cm/sec CCA:  41/28 cm/sec SYSTOLIC ICA/CCA RATIO:  1.2 ECA:  82 cm/sec LEFT ICA:  60/20 cm/sec CCA:  786/76 cm/sec SYSTOLIC ICA/CCA RATIO:  0.8 ECA:  78 cm/sec RIGHT CAROTID ARTERY: Examination of preliminary grayscale images demonstrates no significant atherosclerotic plaque in the carotid system on the right. The waveforms, velocities and flow velocity ratios show no evidence of focal hemodynamically significant stenosis. RIGHT VERTEBRAL ARTERY:  Antegrade in nature. LEFT CAROTID ARTERY: Preliminary grayscale images of the left carotid system demonstrate no evidence of significant atherosclerotic plaque. Waveforms, velocities and flow velocity ratios show no evidence of focal hemodynamically significant stenosis. LEFT VERTEBRAL ARTERY:  Antegrade in nature. Note is made of lymph nodes in the neck bilaterally with normal fatty hila. On the left the node measures 1 cm in short axis. Node adjacent to the right external iliac artery measures 9.6 mm in short axis. These correspond the lymph nodes seen on prior CT examination. IMPRESSION: No evidence of focal hemodynamically significant stenosis bilaterally. Small lymph nodes are seen bilaterally. These are stable from a prior CT in 2021.  Electronically Signed   By: Linus Mako.D.  On: 08/27/2020 01:41   ECHOCARDIOGRAM COMPLETE  Result Date: 08/28/2020    ECHOCARDIOGRAM REPORT   Patient Name:   Martin Mullen Date of Exam: 08/27/2020 Medical Rec #:  [id number]       Height:       70.0 in Accession #:    [id number]      Weight:       255.0 lb Date of Birth:  12-27-1980       BSA:          2.314 m Patient Age:    89 years        BP:           128/78 mmHg Patient Gender: M               HR:           71 bpm. Exam Location:  [facility] Procedure: 2D Echo, Color Doppler, Cardiac Doppler and Intracardiac            Opacification Agent Indications:     I63.9 Stroke  History:         Patient has prior history of Echocardiogram examinations. CAD;                  Risk Factors:Diabetes, HCL and Current Smoker.  Sonographer:     Charmayne Sheer RDCS (AE) Referring Phys:  [id number] [place] Diagnosing Phys: Neoma Laming MD  Sonographer Comments: Technically difficult study due to poor echo windows. Image acquisition challenging due to patient body habitus and Image acquisition challenging due to respiratory motion. IMPRESSIONS  1. Left ventricular ejection fraction, by estimation, is 40 to 45%. The left ventricle has mildly decreased function. The left ventricle has no regional wall motion abnormalities. There is mild concentric left ventricular hypertrophy. Left ventricular diastolic parameters are consistent with Grade I diastolic dysfunction (impaired relaxation).  2. Right ventricular systolic function is normal. The right ventricular size is normal.  3. The mitral valve is normal in structure. No evidence of mitral valve regurgitation. No evidence of mitral stenosis.  4. The aortic valve is normal in structure. Aortic valve regurgitation is not visualized. Mild aortic valve sclerosis is present, with no evidence of aortic valve stenosis.  5. The inferior vena cava is normal in size with greater than 50% respiratory variability, suggesting right atrial  pressure of 3 mmHg. FINDINGS  Left Ventricle: Left ventricular ejection fraction, by estimation, is 40 to 45%. The left ventricle has mildly decreased function. The left ventricle has no regional wall motion abnormalities. Definity contrast agent was given IV to delineate the left ventricular endocardial borders. The left ventricular internal cavity size was normal in size. There is mild concentric left ventricular hypertrophy. Left ventricular diastolic parameters are consistent with Grade I diastolic dysfunction (impaired relaxation). Right Ventricle: The right ventricular size is normal. No increase in right ventricular wall thickness. Right ventricular systolic function is normal. Left Atrium: Left atrial size was normal in size. Right Atrium: Right atrial size was normal in size. Pericardium: There is no evidence of pericardial effusion. Mitral Valve: The mitral valve is normal in structure. No evidence of mitral valve regurgitation. No evidence of mitral valve stenosis. MV peak gradient, 2.0 mmHg. The mean mitral valve gradient is 1.0 mmHg. Tricuspid Valve: The tricuspid valve is normal in structure. Tricuspid valve regurgitation is not demonstrated. No evidence of tricuspid stenosis. Aortic Valve: The aortic valve is normal in structure. Aortic valve regurgitation is not visualized. Mild aortic valve sclerosis  is present, with no evidence of aortic valve stenosis. Aortic valve mean gradient measures 3.0 mmHg. Aortic valve peak gradient measures 4.2 mmHg. Aortic valve area, by VTI measures 3.09 cm. Pulmonic Valve: The pulmonic valve was normal in structure. Pulmonic valve regurgitation is not visualized. No evidence of pulmonic stenosis. Aorta: The aortic root is normal in size and structure. Venous: The inferior vena cava is normal in size with greater than 50% respiratory variability, suggesting right atrial pressure of 3 mmHg. IAS/Shunts: No atrial level shunt detected by color flow Doppler.  LEFT  VENTRICLE PLAX 2D LVIDd:         3.60 cm     Diastology LVIDs:         2.80 cm     LV e' medial:    6.64 cm/s LV PW:         1.10 cm     LV E/e' medial:  9.3 LV IVS:        1.20 cm     LV e' lateral:   13.40 cm/s LVOT diam:     2.30 cm     LV E/e' lateral: 4.6 LV SV:         64 LV SV Index:   27 LVOT Area:     4.15 cm  LV Volumes (MOD) LV vol d, MOD A4C: 60.2 ml LV vol s, MOD A4C: 29.5 ml LV SV MOD A4C:     60.2 ml LEFT ATRIUM           Index LA diam:      3.90 cm 1.69 cm/m LA Vol (A4C): 21.2 ml 9.16 ml/m  AORTIC VALVE                   PULMONIC VALVE AV Area (Vmax):    3.40 cm    PV Vmax:       0.91 m/s AV Area (Vmean):   3.23 cm    PV Vmean:      64.400 cm/s AV Area (VTI):     3.09 cm    PV VTI:        0.173 m AV Vmax:           103.00 cm/s PV Peak grad:  3.3 mmHg AV Vmean:          78.400 cm/s PV Mean grad:  2.0 mmHg AV VTI:            0.206 m AV Peak Grad:      4.2 mmHg AV Mean Grad:      3.0 mmHg LVOT Vmax:         84.20 cm/s LVOT Vmean:        60.900 cm/s LVOT VTI:          0.153 m LVOT/AV VTI ratio: 0.74  AORTA Ao Root diam: 4.00 cm MITRAL VALVE MV Area (PHT): 3.77 cm    SHUNTS MV Area VTI:   3.24 cm    Systemic VTI:  0.15 m MV Peak grad:  2.0 mmHg    Systemic Diam: 2.30 cm MV Mean grad:  1.0 mmHg MV Vmax:       0.71 m/s MV Vmean:      47.7 cm/s MV Decel Time: 201 msec MV E velocity: 61.70 cm/s MV A velocity: 60.40 cm/s MV E/A ratio:  1.02 Neoma Laming MD Electronically signed by Neoma Laming MD Signature Date/Time: 08/28/2020/8:56:21 AM    Final       ASSESSMENT & PLAN:  1.  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 ([place])   2. Recurrent strokes (HCC)    # Recurrent stroke and Heterozygous prothrombin gene mutation Patient has prothrombin gene heterozygous mutation which by itself is not a major risk factor for arterial thrombosis.  Estimated venous thrombosis risk is about a 3-4 fold. Patient has multiple other risk factors including uncontrolled diabetes, history of MI, hypertension,  smoking, recent COVID infection which all potentially contribute to his recurrent stroke. Recommend patient to finish 3 months anticoagulation of Eliquis 40m BID, followed by Aspirin/Plavix or Eliquis 2.577mBID long term.   To finish outpatient TEE,.  Orders Placed This Encounter  Procedures   CBC with Differential/Platelet    Standing Status:   Future    Standing Expiration Date:   09/16/2021   Comprehensive metabolic panel    Standing Status:   Future    Standing Expiration Date:   09/16/2021    All questions were answered. The patient knows to call the clinic with any problems questions or concerns.  cc BaJearld FentonNP    Return of visit:  3 months.   ZhEarlie ServerMD, PhD Hematology Oncology [facility]- [phone]/06/2020

## 2020-09-23 ENCOUNTER — Other Ambulatory Visit: Payer: Self-pay

## 2020-09-23 ENCOUNTER — Encounter
Admission: RE | Disposition: A | Discharge status: Home / Self Care | Payer: Self-pay | Source: Home / Self Care | Attending: Internal Medicine

## 2020-09-23 ENCOUNTER — Ambulatory Visit
Admission: RE | Admit: 2020-09-23 | Discharge: 2020-09-23 | Disposition: A | Discharge status: Home / Self Care | Payer: BC Managed Care – PPO | Attending: Internal Medicine | Admitting: Internal Medicine

## 2020-09-23 ENCOUNTER — Encounter: Payer: Self-pay | Admitting: Internal Medicine

## 2020-09-23 ENCOUNTER — Ambulatory Visit
Admission: RE | Admit: 2020-09-23 | Discharge: 2020-09-23 | Disposition: A | Discharge status: Home / Self Care | Payer: BC Managed Care – PPO | Source: Home / Self Care | Attending: Internal Medicine | Admitting: Internal Medicine

## 2020-09-23 DIAGNOSIS — I639 Cerebral infarction, unspecified: Secondary | ICD-10-CM | POA: Diagnosis not present

## 2020-09-23 DIAGNOSIS — I6359 Cerebral infarction due to unspecified occlusion or stenosis of other cerebral artery: Secondary | ICD-10-CM | POA: Diagnosis not present

## 2020-09-23 DIAGNOSIS — I700 Atherosclerosis of aorta: Secondary | ICD-10-CM | POA: Insufficient documentation

## 2020-09-23 DIAGNOSIS — E119 Type 2 diabetes mellitus without complications: Secondary | ICD-10-CM | POA: Diagnosis not present

## 2020-09-23 HISTORY — PX: TEE WITHOUT CARDIOVERSION: SHX5443

## 2020-09-23 LAB — GLUCOSE, CAPILLARY: Glucose-Capillary: 179 mg/dL — ABNORMAL HIGH (ref 70–99)

## 2020-09-23 SURGERY — ECHOCARDIOGRAM, TRANSESOPHAGEAL
Anesthesia: Moderate Sedation

## 2020-09-23 MED ORDER — MIDAZOLAM HCL 5 MG/5ML IJ SOLN
INTRAMUSCULAR | Status: AC
  Filled 2020-09-23: qty 5

## 2020-09-23 MED ORDER — LIDOCAINE VISCOUS HCL 2 % MT SOLN
OROMUCOSAL | Status: AC
  Filled 2020-09-23: qty 15

## 2020-09-23 MED ORDER — BUTAMBEN-TETRACAINE-BENZOCAINE 2-2-14 % EX AERO
INHALATION_SPRAY | CUTANEOUS | Status: AC | PRN
  Administered 2020-09-23: 2 via TOPICAL

## 2020-09-23 MED ORDER — BUTAMBEN-TETRACAINE-BENZOCAINE 2-2-14 % EX AERO
INHALATION_SPRAY | CUTANEOUS | Status: AC
  Filled 2020-09-23: qty 5

## 2020-09-23 MED ORDER — FENTANYL CITRATE (PF) 100 MCG/2ML IJ SOLN
INTRAMUSCULAR | Status: AC | PRN
  Administered 2020-09-23: 50 ug via INTRAVENOUS
  Administered 2020-09-23: 25 ug via INTRAVENOUS

## 2020-09-23 MED ORDER — FENTANYL CITRATE (PF) 100 MCG/2ML IJ SOLN
INTRAMUSCULAR | Status: AC
  Filled 2020-09-23: qty 2

## 2020-09-23 MED ORDER — SODIUM CHLORIDE FLUSH 0.9 % IV SOLN
INTRAVENOUS | Status: AC
  Filled 2020-09-23: qty 10

## 2020-09-23 MED ORDER — MIDAZOLAM HCL 5 MG/5ML IJ SOLN
INTRAMUSCULAR | Status: AC | PRN
Start: 2020-09-23 — End: 2020-09-23
  Administered 2020-09-23: 2 mg via INTRAVENOUS
  Administered 2020-09-23: 1 mg via INTRAVENOUS

## 2020-09-23 MED ORDER — LIDOCAINE VISCOUS HCL 2 % MT SOLN
OROMUCOSAL | Status: AC | PRN
  Administered 2020-09-23: 15 mL via OROMUCOSAL

## 2020-09-23 MED ORDER — SODIUM CHLORIDE 0.9 % IV SOLN: INTRAVENOUS | Status: DC

## 2020-09-23 NOTE — Sedation Documentation (Signed)
Medication dose calculated and verified for: Versed 3 mg IV, Fentany 75 mcg IV.

## 2020-09-23 NOTE — Progress Notes (Signed)
*  PRELIMINARY RESULTS* Echocardiogram Echocardiogram Transesophageal has been performed.  Cristela Blue 09/23/2020, 9:06 AM

## 2020-09-23 NOTE — Progress Notes (Signed)
MD spoke with pt. At bedside now; pt. To f/u with pt. In 3-4 weeks.

## 2020-09-23 NOTE — CV Procedure (Signed)
Transesophageal echocardiogram preliminary report  AVIGDOR DOLLAR [id number] 1981-01-28  Preliminary diagnosis  Stroke with possible embolic source  Postprocedural diagnosis  Normal LV systolic function no apparent source of embolus  Time out A timeout was performed by the nursing staff and physicians specifically identifying the procedure performed, identification of the patient, the type of sedation, all allergies and medications, all pertinent medical history, and presedation assessment of nasopharynx. The patient and or family understand the risks of the procedure including the rare risks of death, stroke, heart attack, esophogeal perforation, sore throat, and reaction to medications given.  Moderate sedation During this procedure the patient has received Versed 3 milligrams and fentanyl 75 micrograms to achieve appropriate moderate sedation.  The patient had continued monitoring of heart rate, oxygenation, blood pressure, respiratory rate, and extent of signs of sedation throughout the entire procedure.  The patient received this moderate sedation over a period of 31 minutes.  Both the nursing staff and I were present during the procedure when the patient had moderate sedation for 100% of the time.  Treatment considerations  No further cardiac intervention due to no apparent cardiac source of embolus  For further details of transesophageal echocardiogram please refer to final report.  Signed,  Lamar Blinks M.D. [facility] 09/23/2020 8:55 AM

## 2020-09-23 NOTE — Sedation Documentation (Signed)
Slight delay in procedure : waiting for ECHO machine. MD at bedise. Pt. Stable. VSS

## 2020-09-29 ENCOUNTER — Other Ambulatory Visit: Payer: Self-pay | Admitting: Internal Medicine

## 2020-09-29 NOTE — Telephone Encounter (Signed)
Requested Prescriptions  Pending Prescriptions Disp Refills  . insulin glargine (LANTUS SOLOSTAR) 100 UNIT/ML Solostar Pen [Pharmacy Med Name: LANTUS SOLOSTAR PEN INJ 3ML] 15 mL 1    Sig: ADMINISTER 23 UNITS UNDER THE SKIN EVERY DAY     There is no refill protocol information for this order

## 2020-10-01 DIAGNOSIS — I208 Other forms of angina pectoris: Secondary | ICD-10-CM | POA: Diagnosis not present

## 2020-10-07 ENCOUNTER — Other Ambulatory Visit: Payer: Self-pay

## 2020-10-07 ENCOUNTER — Encounter: Payer: Self-pay | Admitting: Internal Medicine

## 2020-10-07 ENCOUNTER — Ambulatory Visit (INDEPENDENT_AMBULATORY_CARE_PROVIDER_SITE_OTHER): Payer: BC Managed Care – PPO | Admitting: Internal Medicine

## 2020-10-07 VITALS — BP 132/83 | HR 98 | Temp 97.3°F | Resp 17 | Ht 70.0 in | Wt 249.4 lb

## 2020-10-07 DIAGNOSIS — E785 Hyperlipidemia, unspecified: Secondary | ICD-10-CM

## 2020-10-07 DIAGNOSIS — R29898 Other symptoms and signs involving the musculoskeletal system: Secondary | ICD-10-CM | POA: Diagnosis not present

## 2020-10-07 DIAGNOSIS — E1169 Type 2 diabetes mellitus with other specified complication: Secondary | ICD-10-CM | POA: Diagnosis not present

## 2020-10-07 DIAGNOSIS — Z6836 Body mass index (BMI) 36.0-36.9, adult: Secondary | ICD-10-CM

## 2020-10-07 DIAGNOSIS — E782 Mixed hyperlipidemia: Secondary | ICD-10-CM | POA: Diagnosis not present

## 2020-10-07 DIAGNOSIS — I639 Cerebral infarction, unspecified: Secondary | ICD-10-CM | POA: Diagnosis not present

## 2020-10-07 DIAGNOSIS — I2510 Atherosclerotic heart disease of native coronary artery without angina pectoris: Secondary | ICD-10-CM

## 2020-10-07 DIAGNOSIS — E66812 Obesity, class 2: Secondary | ICD-10-CM

## 2020-10-07 MED ORDER — ROSUVASTATIN CALCIUM 10 MG PO TABS: 10.0000 mg | ORAL_TABLET | Freq: Every day | ORAL | 2 refills | Status: DC

## 2020-10-07 NOTE — Progress Notes (Signed)
Subjective:    Patient ID: Martin Mullen, male    DOB: [birth year], 40 y.o.   MRN: [id number]  HPI  DM 2: His last A1C was 9.4%, 08/2020. His sugars range 160-180. He is currently taking 23 units of Lantus. He is taking Metformin as prescribed. His last eye exam was over a year ago.  HLD: His last LDL was 125, triglycerides 179, 08/2020.  He is taking Atorvastatin as prescribed.  He does not consume a low fat diet.  He also reports left leg weakness status post recurrent strokes.  He reports left leg weakness has called multiple falls.  He is currently out of work on [employer].  He reports neurology has ordered PT but he has not heard anything about this.  Review of Systems  Past Medical History:  Diagnosis Date   Acute transmural inferior wall MI ([place]) 08/28/2012   .5 x 12 mm Veri-flex stent non-DES.   Chicken pox    Coronary artery disease    Inferior ST elevation myocardial infarction in July of 2014. Cardiac catheterization showed 95% distal RCA stenosis and 90% first diagonal stenosis with normal ejection fraction. He underwent PCI in bare-metal stent placement to the distal RCA.   Diabetes mellitus without complication (HCC)    Hypercholesterolemia    Stroke ([place])    Tobacco use     Current Outpatient Medications  Medication Sig Dispense Refill   apixaban (ELIQUIS) 5 MG TABS tablet Take 1 tablet (5 mg total) by mouth 2 (two) times daily. 60 tablet 2   aspirin 81 MG EC tablet Take 1 tablet (81 mg total) by mouth daily. 30 tablet 4   atorvastatin (LIPITOR) 80 MG tablet Take 80 mg by mouth daily.     Blood Glucose Monitoring Suppl (BAYER CONTOUR NEXT MONITOR) w/Device KIT 1 Device 1 kit 0   CONTOUR NEXT TEST test strip USE 1 STRIP TO CHECK GLUCOSE THREE TIMES DAILY AS NEEDED 100 each 0   insulin glargine (LANTUS SOLOSTAR) 100 UNIT/ML Solostar Pen ADMINISTER 23 UNITS UNDER THE SKIN EVERY DAY 15 mL 1   metFORMIN (GLUCOPHAGE) 1000 MG tablet TAKE 1 TABLET(1000 MG) BY MOUTH TWICE DAILY  WITH A MEAL 180 tablet 0   Multiple Vitamin (MULTIVITAMIN WITH MINERALS) TABS tablet Take 1 tablet by mouth in the morning. One-A-Day Multivitamin     nitroGLYCERIN (NITROSTAT) 0.4 MG SL tablet DISSOLVE ONE TABLET UNDER THE TONGUE EVERY 5 MINUTES AS NEEDED FOR CHEST PAIN.  DO NOT EXCEED A TOTAL OF 3 DOSES IN 15 MINUTES 25 tablet 2   Omega-3 Fatty Acids (FISH OIL) 1200 MG CAPS Take 1,200 mg by mouth in the morning and at bedtime.     No current facility-administered medications for this visit.    No Known Allergies  Family History  Problem Relation Age of Onset   Arthritis Mother        Rheumatoid   Diabetes Mother    Hyperlipidemia Mother    Diabetes Father    Cancer Maternal Grandfather        Prostate   Parkinson's disease Maternal Grandfather     Social History   Socioeconomic History   Marital status: Divorced    Spouse name: Not on file   Number of children: 2   Years of education: Not on file   Highest education level: Not on file  Occupational History   Not on file  Tobacco Use   Smoking status: Every Day    Packs/day: 1.00  Years: 30.00    Pack years: 30.00    Types: Cigarettes   Smokeless tobacco: Never  Vaping Use   Vaping Use: Never used  Substance and Sexual Activity   Alcohol use: Not Currently    Alcohol/week: 0.0 standard drinks    Comment: occasional   Drug use: Yes    Types: Marijuana    Comment: 4 days ago   Sexual activity: Yes    Birth control/protection: None  Other Topics Concern   Not on file  Social History Narrative   Lives with girlfriend and her mother    Social Determinants of Health   Financial Resource Strain: Not on file  Food Insecurity: Not on file  Transportation Needs: Not on file  Physical Activity: Not on file  Stress: Not on file  Social Connections: Not on file  Intimate Partner Violence: Not on file     Constitutional: Denies fever, malaise, fatigue, headache or abrupt weight changes.  Respiratory: Denies  difficulty breathing, shortness of breath, cough or sputum production.   Cardiovascular: Denies chest pain, chest tightness, palpitations or swelling in the hands or feet.  Musculoskeletal: Patient reports left leg weakness.  Denies decrease in range of motion, difficulty with gait, muscle pain or joint pain and swelling.  Skin: Denies redness, rashes, lesions or ulcercations.  Neurological: Patient reports difficulty with balance.  Denies dizziness, difficulty with memory, difficulty with speech or problems with coordination.    No other specific complaints in a complete review of systems (except as listed in HPI above).     Objective:   Physical Exam   BP 132/83 (BP Location: Right Arm, Patient Position: Sitting, Cuff Size: Large)   Pulse 98   Temp (!) 97.3 F (36.3 C) (Temporal)   Resp 17   Ht 5' 10"  (1.778 m)   Wt 249 lb 6.4 oz (113.1 kg)   SpO2 99%   BMI 35.79 kg/m  Wt Readings from Last 3 Encounters:  10/07/20 249 lb 6.4 oz (113.1 kg)  09/23/20 247 lb 11.2 oz (112.4 kg)  09/16/20 247 lb 11.2 oz (112.4 kg)    General: Appears his stated age, obese, n NAD. Skin: Warm, dry and intact. No ulcerations noted. HEENT: Head: normal shape and size; Eyes: and EOMs intact;  Cardiovascular: Normal rate and rhythm. S1,S2 noted.  No murmur, rubs or gallops noted. No JVD or BLE edema. No carotid bruits noted. Pulmonary/Chest: Normal effort and positive vesicular breath sounds. No respiratory distress. No wheezes, rales or ronchi noted.  Musculoskeletal: Strength 5/5 RLE.  Strength 4/5 LLE.  Gait slow and steady without device. Neurological: Alert and oriented.    BMET    Component Value Date/Time   NA 137 09/02/2020 1100   NA 140 11/13/2019 1550   NA 139 01/29/2013 0040   K 4.7 09/02/2020 1100   K 4.0 01/29/2013 0040   CL 101 09/02/2020 1100   CL 107 01/29/2013 0040   CO2 28 09/02/2020 1100   CO2 28 01/29/2013 0040   GLUCOSE 247 (H) 09/02/2020 1100   GLUCOSE 228 (H)  01/29/2013 0040   BUN 11 09/02/2020 1100   BUN 10 11/13/2019 1550   BUN 15 01/29/2013 0040   CREATININE 1.00 09/02/2020 1100   CALCIUM 9.5 09/02/2020 1100   CALCIUM 9.2 01/29/2013 0040   GFRNONAA >60 08/28/2020 0431   GFRNONAA >60 01/29/2013 0040   GFRAA 104 11/13/2019 1550   GFRAA >60 01/29/2013 0040    Lipid Panel     Component Value  Date/Time   CHOL 187 08/27/2020 0720   CHOL 133 11/13/2019 1550   TRIG 179 (H) 08/27/2020 0720   HDL 26 (L) 08/27/2020 0720   HDL 24 (L) 11/13/2019 1550   CHOLHDL 7.2 08/27/2020 0720   VLDL 36 08/27/2020 0720   LDLCALC 125 (H) 08/27/2020 0720   LDLCALC 105 (H) 07/01/2020 0824    CBC    Component Value Date/Time   WBC 13.0 (H) 09/02/2020 1100   RBC 5.43 09/02/2020 1100   HGB 16.9 09/02/2020 1100   HGB 16.9 11/13/2019 1550   HCT 52.0 (H) 09/02/2020 1100   HCT 49.9 11/13/2019 1550   PLT 269 09/02/2020 1100   PLT 271 11/13/2019 1550   MCV 95.8 09/02/2020 1100   MCV 93 11/13/2019 1550   MCV 93 01/29/2013 0040   MCH 31.1 09/02/2020 1100   MCHC 32.5 09/02/2020 1100   RDW 12.8 09/02/2020 1100   RDW 13.6 11/13/2019 1550   RDW 13.8 01/29/2013 0040   LYMPHSABS 4.0 08/26/2020 1647   MONOABS 0.9 08/26/2020 1647   EOSABS 0.3 08/26/2020 1647   BASOSABS 0.1 08/26/2020 1647    Hgb A1C Lab Results  Component Value Date   HGBA1C 9.4 (H) 08/27/2020           Assessment & Plan:    Webb Silversmith, NP This visit occurred during the SARS-CoV-2 public health emergency.  Safety protocols were in place, including screening questions prior to the visit, additional usage of staff PPE, and extensive cleaning of exam room while observing appropriate contact time as indicated for disinfecting solutions.

## 2020-10-08 ENCOUNTER — Encounter: Payer: Self-pay | Admitting: Internal Medicine

## 2020-10-08 DIAGNOSIS — I252 Old myocardial infarction: Secondary | ICD-10-CM | POA: Diagnosis not present

## 2020-10-08 DIAGNOSIS — E6609 Other obesity due to excess calories: Secondary | ICD-10-CM | POA: Insufficient documentation

## 2020-10-08 DIAGNOSIS — E1169 Type 2 diabetes mellitus with other specified complication: Secondary | ICD-10-CM | POA: Diagnosis not present

## 2020-10-08 DIAGNOSIS — E1159 Type 2 diabetes mellitus with other circulatory complications: Secondary | ICD-10-CM | POA: Diagnosis not present

## 2020-10-08 DIAGNOSIS — I639 Cerebral infarction, unspecified: Secondary | ICD-10-CM | POA: Diagnosis not present

## 2020-10-08 DIAGNOSIS — Z6827 Body mass index (BMI) 27.0-27.9, adult: Secondary | ICD-10-CM | POA: Insufficient documentation

## 2020-10-08 DIAGNOSIS — E663 Overweight: Secondary | ICD-10-CM | POA: Insufficient documentation

## 2020-10-08 NOTE — Assessment & Plan Note (Signed)
Will D/C Atorvastatin and switch to Rosuvastatin 10 mg daily Reinforced low-fat diet and exercise for weight loss

## 2020-10-08 NOTE — Assessment & Plan Note (Signed)
Discussed the importance of good blood pressure, lipid and diabetes control Persistent left leg weakness, will refer to PT for further evaluation and treatment  RTC in 3 months for follow-up of chronic conditions

## 2020-10-08 NOTE — Assessment & Plan Note (Signed)
Uncontrolled on Atorvastatin, will DC Rx for Rosuvastatin 10 mg daily Reinforced DASH diet and exercise weight loss

## 2020-10-08 NOTE — Assessment & Plan Note (Signed)
Continue Metformin We will have him increase his Lantus by 1 unit every 2 days until fasting sugars are less than 140 Reinforced DASH diet and exercise for weight loss Referral to diabetes education and nutrition Encourage routine eye exams Encourage routine foot exams

## 2020-10-08 NOTE — Patient Instructions (Signed)
Hemiparesis  Hemiparesis is weakness on one side of the body, such as one arm and one leg. Hemiparesis often happens after a stroke. The weakness is usually on the side of the body that is opposite from the side of the brain that was affected by the stroke. For example, a stroke in the left side of the brain may causehemiparesis on the right side of the body. What are the causes? This condition may be caused by: Stroke. Brain injury (trauma). Brain tumor. Multiple sclerosis. Infections or brain abscesses. Seizures. This is called postictal or Todd's paralysis. Migraine headaches. A type of dementia called mitochondrial encephalomyopathy, lactic acidosis, and stroke-like episodes (MELAS). What are the signs or symptoms? Symptoms of this condition include: Weakness, or loss of muscle strength, on one side of the body. This may affect the arm, leg, or face. Trouble doing daily activities, such as dressing or bathing. Loss of balance, problems with coordination, or both. Trouble walking. Problems grabbing things or holding them. Pusher syndrome. This means you push toward the weaker side of your body and often lose your balance. How is this diagnosed? This condition may be diagnosed based on: A physical exam. Your medical history. Imaging tests, such as a CT scan or MRI. How is this treated? Treatment for this condition includes: Physical therapy to help improve your strength, balance, and coordination. Occupational therapy to help you to do basic daily tasks, such as bathing and feeding yourself. Using devices to help you move around (assistive devices). These include canes, walkers, or wheelchairs. Exercises that are done in a pool (hydrotherapy). Medicines that help with stiffness and discomfort. Modified constraint-induced movement therapy (mCIMT). This therapy restricts one side of your body so that you have to use the weaker side. Electrical stimulation. This involves stimulating  the muscles on the weaker side of your body with small electrical pads. Experimental treatments like cortical stimulation therapy. This involves stimulating your brain with electrical currents to make it work better. Follow these instructions at home: Safety  Use assistive devices as instructed. Your risk of falling is higher because of hemiparesis. Make changes in your home to lower your risk of falls. These may include: Removing area rugs and mats from the floor. Getting rid of clutter. Adding rails or grab bars in bathrooms.  Activity Do not drive unless your health care provider approves. Do not drive or use heavy machinery while taking medicine that may affect your alertness or reaction time. Return to your normal activities as told by your health care provider. Ask your health care provider what activities are safe for you. Ask your health care provider about getting extra help at home. You may have problems doing daily tasks, such as dressing, bathing, using the bathroom, and eating. General instructions Do not use any products that contain nicotine or tobacco. These products include cigarettes, chewing tobacco, and vaping devices, such as e-cigarettes. If you need help quitting, ask your health care provider. Take over-the-counter and prescription medicines only as told by your health care provider. Wear clothes and shoes that are easy to put on and take off. Try to avoid zippers and buttons. Avoid drinking alcohol. Keep all follow-up visits. This is important. Contact a health care provider if: Your symptoms get worse and medicines do not help. You have severe pain. You have repeated falls. Get help right away if: You have jerky movements that you cannot control (seizure). You have trouble breathing. You have chest pain. These symptoms may represent a serious problem  that is an emergency. Do not wait to see if the symptoms will go away. Get medical help right away. Call your  local emergency services (911 in the [place] Do not drive yourself to the hospital. Summary Hemiparesis is weakness on one side of the body, such as one arm and one leg. This often happens after a stroke or brain injury. Treatment usually includes physical and occupational therapy. Occupational therapy can help you with everyday tasks, such as dressing, using the bathroom, and eating. Your risk of falling is higher with this condition. You may need to use a wheelchair, walker, or other assistive device. Make changes in your home to prevent falls, such as removing area rugs and clutter, and installing grab bars in the bathroom. This information is not intended to replace advice given to you by your health care provider. Make sure you discuss any questions you have with your healthcare provider. Document Revised: 01/26/2020 Document Reviewed: 01/26/2020 Elsevier Patient Education  2022 [employer].

## 2020-10-08 NOTE — Assessment & Plan Note (Signed)
Encourage diet and exercise for weight loss 

## 2020-10-12 ENCOUNTER — Ambulatory Visit: Payer: BC Managed Care – PPO | Admitting: Physical Therapy

## 2020-10-14 ENCOUNTER — Encounter: Payer: Self-pay | Admitting: Physical Therapy

## 2020-10-14 ENCOUNTER — Ambulatory Visit: Payer: BC Managed Care – PPO | Attending: Internal Medicine | Admitting: Physical Therapy

## 2020-10-14 ENCOUNTER — Other Ambulatory Visit: Payer: Self-pay

## 2020-10-14 DIAGNOSIS — M6281 Muscle weakness (generalized): Secondary | ICD-10-CM | POA: Diagnosis not present

## 2020-10-14 DIAGNOSIS — R2689 Other abnormalities of gait and mobility: Secondary | ICD-10-CM | POA: Diagnosis not present

## 2020-10-14 DIAGNOSIS — R296 Repeated falls: Secondary | ICD-10-CM | POA: Insufficient documentation

## 2020-10-14 NOTE — Therapy (Signed)
[facility] [address] [place], [place], [address] Phone: [phone]   Fax:  [phone]  Physical Therapy Evaluation  Patient Details  Name: Martin Mullen MRN: [id number] Date of Birth: 1980-03-18 Referring Provider (PT): [name], NP   Encounter Date: 10/14/2020   PT End of Session - 10/14/20 1126     Visit Number 1    Number of Visits 17    Date for PT Re-Evaluation 12/09/20    Authorization Type [employer]; VL 60 visits PT/OT/Chiro combined per calendar year    Progress Note Due on Visit 17    PT Start Time 0932    PT Stop Time 1015    PT Time Calculation (min) 43 min    Equipment Utilized During Treatment Gait belt    Activity Tolerance Patient tolerated treatment well    Behavior During Therapy Flat affect             Past Medical History:  Diagnosis Date   Acute transmural inferior wall MI (HCC) 08/28/2012   .5 x 12 mm Veri-flex stent non-DES.   Chicken pox    Coronary artery disease    Inferior ST elevation myocardial infarction in July of 2014. Cardiac catheterization showed 95% distal RCA stenosis and 90% first diagonal stenosis with normal ejection fraction. He underwent PCI in bare-metal stent placement to the distal RCA.   Diabetes mellitus without complication (HCC)    Hypercholesterolemia    Stroke (HCC)    Tobacco use     Past Surgical History:  Procedure Laterality Date   CORONARY ANGIOPLASTY WITH STENT PLACEMENT     DEBRIDEMENT AND CLOSURE WOUND N/A 09/03/2019   Procedure: Debridement and closure of scrotal wound;  Surgeon: Allena Napoleon, MD;  Location: [facility];  Service: Plastics;  Laterality: N/A;   DEBRIDEMENT AND CLOSURE WOUND N/A 07/25/2019   Procedure: DEBRIDEMENT AND CLOSURE WOUND;  Surgeon: Allena Napoleon, MD;  Location: [facility];  Service: Plastics;  Laterality: N/A;   INCISION AND DRAINAGE ABSCESS N/A 07/20/2019   Procedure: INCISION AND DRAINAGE ABSCESS;  Surgeon:  Crista Elliot, MD;  Location: [facility];  Service: Urology;  Laterality: N/A;   LEFT HEART CATHETERIZATION WITH CORONARY ANGIOGRAM N/A 08/28/2012   Procedure: LEFT HEART CATHETERIZATION WITH CORONARY ANGIOGRAM;  Surgeon: Pamella Pert, MD;  Location: [facility];  Service: Cardiovascular;  Laterality: N/A;   TEE WITHOUT CARDIOVERSION N/A 09/23/2020   Procedure: TRANSESOPHAGEAL ECHOCARDIOGRAM (TEE);  Surgeon: Lamar Blinks, MD;  Location: [facility];  Service: Cardiovascular;  Laterality: N/A;   WRIST SURGERY  1988    There were no vitals filed for this visit.    Subjective Assessment - 10/14/20 1118     Subjective 40 year old male with primary complaint of recurrent falls; L-sided weakness and imbalance s/p CVA (admitted for CVA 08/26/20).    Pertinent History Patient is a 40 year old male with involved medical history previously seen for neck/shoulder pain. He was referred to ED with concerning neurological signs toward end of his course of PT. Patient had basal ganglia lacunar type infarct. Patient currently reports having frequent falls. His PCP thinks this may be due to L leg weakness. He reports having sudden falls with walking across his home and with going down front steps of his home. He states he often falls backward onto his seat when going to stand. He reports some difficulty with dysphagia. He denies issue with choking on his food. Patient reports  no major change in sensation. 5 steps to exit home. Patient had 2 handrails to get up steps. Patient reports having incidents of LOB while in the shower with his eyes closed. He reports more urinary urgency. Pt has 2 young kids that are often crawling around his household. [employer]. Pt reports some difficulty with car transfer- lifting his LLE.  No previous PT for this condition. Directional pattern for falls: Posterior usually.    Limitations Walking;House hold activities;Standing    Diagnostic tests Brain MRI - see below     Patient Stated Goals Decreased falls, improved balance                [facility] PT Assessment - 10/14/20 0001       Assessment   Medical Diagnosis Left leg weakness    Referring Provider (PT) [name], NP    Onset Date/Surgical Date 08/26/20    Next MD Visit 12/07/20    Prior Therapy None for this condition      Precautions   Precautions Fall      Balance Screen   Has the patient fallen in the past 6 months Yes    How many times? 5    Has the patient had a decrease in activity level because of a fear of falling?  Yes    Is the patient reluctant to leave their home because of a fear of falling?  No      Prior Function   Level of Independence Independent with household mobility without device    Vocation On disability    Vocation Requirements repetitive reaching, moving, organizing specimens      Cognition   Overall Cognitive Status Impaired/Different from baseline    Area of Impairment Safety/judgement;Awareness              SUBJECTIVE Chief complaint: 40 year old male with L-sided weakness and imbalance s/p CVA (admitted for CVA 08/26/20). Onset: Patient is a 40 year old male with involved medical history previously seen for neck/shoulder pain. He was referred to ED with concerning neurological signs toward end of his course of PT. Pt was admitted following visit to ED on 08/26/20. Patient had basal ganglia lacunar type infarct. Patient currently reports having frequent falls. His PCP thinks this may be due to L leg weakness per patient report. He reports having sudden falls with walking across his home and with going down front steps of his home. He states he often falls backward onto his seat when going to stand. He reports some difficulty with dysphagia. He denies issue with choking on his food. Patient reports no major change in sensation since CVA. 5 steps to enter/exit home. Patient has 2 handrails to get up steps. Patient reports having incidents of LOB while in  the shower with his eyes closed. He reports more urinary urgency. Pt has 2 young kids that are often crawling around his home and have caused him to trip before. He has gravel driveway. Pt reports some difficulty with car transfer- lifting his LLE.  Recent changes in overall health/medication: Yes, recent CVA Directional pattern for falls: Posterior usually Prior history of physical therapy for balance: None Follow-up appointment with MD: December 07, 2020 Red flags (bowel/bladder changes, saddle paresthesia, personal history of cancer, chills/fever, night sweats, unrelenting pain) Negative   Brain MRI (08/27/20, report from radiologist);  1. 1.2 cm acute ischemic nonhemorrhagic right basal ganglia/internal capsule infarct. 2. Additional 4 mm acute ischemic nonhemorrhagic cortical infarct involving the posterior left frontal cortex. 3. Few  additional vague subcentimeter foci of diffusion abnormality involving the left thalamocapsular region, likely subacute ischemic changes. 4. Underlying remote lacunar infarcts involving the right corona radiata, bilateral basal ganglia, bilateral thalami, and pons/midbrain.     OBJECTIVE  MUSCULOSKELETAL: Tremor: Absent Bulk: Normal Tone: Normal  Posture Forward head rounded shoulders, rests in kyphotic posture in sitting  Gait Mild dec stance time on LLE with transient trunk flexion  Strength R/L 5/4- Hip flexion 4/4 Hip abduction 5/5 Hip adduction 5/4 Knee extension 5/4+ Knee flexion 5/5 Ankle Plantarflexion 5/4- Ankle Dorsiflexion   NEUROLOGICAL:  Mental Status Patient is oriented to person, place and time.  Recent memory is intact.  Remote memory is intact.  Attention span and concentration are intact.  Expressive speech is intact.  Patient's fund of knowledge is within normal limits for educational level.  Cranial Nerves Visual acuity and visual fields are intact  Extraocular muscles are intact (moderate nystagmus with  horizontal tracking and convergence) Facial sensation is intact bilaterally  Facial strength is intact bilaterally  Hearing is normal as tested by gross conversation Palate elevates midline, normal phonation  Shoulder shrug strength is intact  Tongue protrudes midline   Sensation Grossly intact to light touch bilateral UEs/LEs as determined by testing dermatomes C2-T2/L2-S2 with exception of decreased light touch in bilateral medial malleoli. Proprioception and hot/cold testing deferred on this date   Coordination/Cerebellar Finger to Nose: WNL Heel to Shin: WNL (L hip flexion weakness)  Dysdiadochokinesia: Uncoordinated L arm alternating movement  Finger Opposition: Mild dysmetria L thumb to pulp Pronator Drift: Positive LUE   FUNCTIONAL OUTCOME MEASURES   Results Comments  BERG (Next visit)/56   TUG (Next visit)   5TSTS 13 seconds Fall risk  10 Meter Gait Speed (Next visit) Self-selected: s = m/s; Fastest: s = m/s   ABC Scale (Next visit)%     POSTURAL CONTROL TESTS   Modified Clinical Test of Sensory Interaction for Balance    (CTSIB):  CONDITION TIME STRATEGY SWAY  Eyes open, firm surface 30 seconds ankle minimal  Eyes closed, firm surface 30 seconds ankle minimal  Eyes open, foam surface 30 seconds ankle moderate  Eyes closed, foam surface 30 seconds ankle moderate     FUNCTIONAL TASK  Stairs: bilateral upper extremity support with mild instability with single-limb lowering during descent, loss of footwear x 2 (wearing slides)     ASSESSMENT Clinical Impression: Pt is a pleasant 40 year old male referred for left lower extremity weakness with primary complaint of recurrent falls and imbalance s/p CVA. PT examination reveals deficits in lower extremity strength (L-sided primarily), mild gait deviation, postural stability deficits, and impaired motor coordination. Pt presents with deficits in strength, coordination, gait, and balance. Pt will benefit from skilled  PT services to address deficits in balance and decrease risk for future falls.     PT Short Term Goals - 10/14/20 1844       PT SHORT TERM GOAL #1   Title Pt will be independent with HEP in order to improve strength and balance in order to decrease fall risk and improve function at home and work.    Baseline 10/14/20: HEP initiated    Time 3    Period Weeks    Status New    Target Date 10/28/20      PT SHORT TERM GOAL #2   Title Pt will perform 5TSTS in less than 12 seconds indicative of no increased risk of falls based on established cut-off score    Baseline 10/14/20:  5TSTS in 13 sec    Time 3    Period Weeks    Status New    Target Date 11/04/20               PT Long Term Goals - 10/14/20 1127       PT LONG TERM GOAL #1   Title Patient will demonstrate improved function as evidenced by a score of 55 on FOTO measure for full participation in activities at home and in the community.    Baseline 10/14/20: 43    Time 8    Period Weeks    Status New    Target Date 12/09/20      PT LONG TERM GOAL #2   Title Pt will improve BERG by at least 3 points in order to demonstrate clinically significant improvement in balance.    Baseline 10/14/20: BERG to be performed next visit    Time 8    Period Weeks    Status New    Target Date 12/09/20      PT LONG TERM GOAL #3   Title Pt will improve ABC by at least 13% in order to demonstrate clinically significant improvement in balance confidence.    Baseline 10/14/20: ABC to be obtained next visit    Time 8    Period Weeks    Status New    Target Date 12/09/20      PT LONG TERM GOAL #4   Title Patient will perform stair ascent and descent with no UE support and reciprocal pattern with no LOB and no major movement deviation (on 4 steps in clinic) simulating negotiating steps to enter/exit home    Baseline 10/14/20: Pt sometimes having falls with stair descent    Time 8    Period Weeks    Status New    Target Date 12/09/20                     Plan - 10/14/20 1840     Clinical Impression Statement Pt is a pleasant 40 year old male referred for left lower extremity weakness with primary complaint of recurrent falls and imbalance s/p CVA. PT examination reveals deficits in lower extremity strength (L-sided primarily), mild gait deviation, postural stability deficits, and impaired motor coordination. Pt presents with deficits in strength, coordination, gait, and balance. Pt will benefit from skilled PT services to address deficits in balance and decrease risk for future falls.    Personal Factors and Comorbidities Comorbidity 3+;Fitness;Profession    Comorbidities obesity, Type 2 DM, Hx of MI and cardiac stent, Hx of CVA, high cholesterol, smoking 1 pack/day    Examination-Activity Limitations Caring for Others;Stairs;Transfers;Locomotion Level;Bathing    Examination-Participation Restrictions Occupation;Interpersonal Relationship    Stability/Clinical Decision Making Evolving/Moderate complexity    Clinical Decision Making Moderate    Rehab Potential Good    PT Frequency 2x / week    PT Duration 8 weeks    PT Treatment/Interventions Therapeutic activities;Therapeutic exercise;Neuromuscular re-education;Manual techniques;Patient/family education;Stair training;Gait training;Functional mobility training;Balance training    PT Next Visit Plan Continue with fall risk assessment (BERG, ABC scale, TUG), progress with balance training, obstacle negotiation, reactive postural control, LE strengthening.    PT Home Exercise Plan HEP Access Code [email or web address]    Consulted and Agree with Plan of Care Patient             Patient will benefit from skilled therapeutic intervention in order to improve the following deficits and impairments:  Decreased strength, Postural dysfunction,  Decreased safety awareness, Decreased coordination, Abnormal gait, Decreased balance  Visit Diagnosis: Repeated falls  Muscle weakness  (generalized)  Imbalance     Problem List Patient Active Problem List   Diagnosis Date Noted   Class 2 severe obesity due to excess calories with serious comorbidity and body mass index (BMI) of 36.0 to 36.9 in adult [facility](HCC) 10/08/2020   Recurrent strokes (HCC) 08/27/2020   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) 03/25/2020   GERD (gastroesophageal reflux disease) 07/20/2019   Coronary artery disease    Type 2 diabetes mellitus with hyperlipidemia (HCC) 07/09/2013   STEMI s/p RCA stent 08/2012 09/05/2012   HTN (hypertension) 09/05/2012   HLD (hyperlipidemia) 09/05/2012   Adjustment disorder with mixed anxiety and depressed mood 09/05/2012   Consuela MimesJeremy Nadalee Neiswender, PT, DPT #[id number]  Gertie ExonJeremy T Debroh Sieloff 10/14/2020, 6:50 PM  [facility] [address] [place], [place], [address] Phone: [phone]   Fax:  [phone]  Name: Martin Mullen MRN: [id number] Date of Birth: 1981/01/12

## 2020-10-21 ENCOUNTER — Other Ambulatory Visit: Payer: Self-pay

## 2020-10-21 ENCOUNTER — Encounter: Payer: Self-pay | Admitting: Physical Therapy

## 2020-10-21 ENCOUNTER — Ambulatory Visit: Payer: BC Managed Care – PPO | Attending: Internal Medicine | Admitting: Physical Therapy

## 2020-10-21 DIAGNOSIS — M542 Cervicalgia: Secondary | ICD-10-CM | POA: Diagnosis not present

## 2020-10-21 DIAGNOSIS — M25511 Pain in right shoulder: Secondary | ICD-10-CM | POA: Diagnosis not present

## 2020-10-21 DIAGNOSIS — R296 Repeated falls: Secondary | ICD-10-CM | POA: Insufficient documentation

## 2020-10-21 DIAGNOSIS — M6281 Muscle weakness (generalized): Secondary | ICD-10-CM | POA: Insufficient documentation

## 2020-10-21 DIAGNOSIS — R2689 Other abnormalities of gait and mobility: Secondary | ICD-10-CM | POA: Diagnosis not present

## 2020-10-21 NOTE — Therapy (Signed)
[facility] [address] [place], [place], [address] Phone: [phone]   Fax:  [phone]  Physical Therapy Treatment  Patient Details  Name: Martin Mullen MRN: [id number] Date of Birth: January 19, 1981 Referring Provider (PT): [name], NP   Encounter Date: 10/21/2020   PT End of Session - 10/21/20 1309     Visit Number 2    Number of Visits 17    Date for PT Re-Evaluation 12/09/20    Authorization Type BCBS; VL 60 visits PT/OT/Chiro combined per calendar year    Progress Note Due on Visit 17    PT Start Time 0937    PT Stop Time 1020    PT Time Calculation (min) 43 min    Equipment Utilized During Treatment Gait belt    Activity Tolerance Patient tolerated treatment well    Behavior During Therapy [facility] for tasks assessed/performed             Past Medical History:  Diagnosis Date   Acute transmural inferior wall MI (HCC) 08/28/2012   .5 x 12 mm Veri-flex stent non-DES.   Chicken pox    Coronary artery disease    Inferior ST elevation myocardial infarction in July of 2014. Cardiac catheterization showed 95% distal RCA stenosis and 90% first diagonal stenosis with normal ejection fraction. He underwent PCI in bare-metal stent placement to the distal RCA.   Diabetes mellitus without complication (HCC)    Hypercholesterolemia    Stroke (HCC)    Tobacco use     Past Surgical History:  Procedure Laterality Date   CORONARY ANGIOPLASTY WITH STENT PLACEMENT     DEBRIDEMENT AND CLOSURE WOUND N/A 09/03/2019   Procedure: Debridement and closure of scrotal wound;  Surgeon: Allena Napoleon, MD;  Location: [facility];  Service: Plastics;  Laterality: N/A;   DEBRIDEMENT AND CLOSURE WOUND N/A 07/25/2019   Procedure: DEBRIDEMENT AND CLOSURE WOUND;  Surgeon: Allena Napoleon, MD;  Location: [facility];  Service: Plastics;  Laterality: N/A;   INCISION AND DRAINAGE ABSCESS N/A 07/20/2019   Procedure: INCISION AND DRAINAGE  ABSCESS;  Surgeon: Crista Elliot, MD;  Location: [facility];  Service: Urology;  Laterality: N/A;   LEFT HEART CATHETERIZATION WITH CORONARY ANGIOGRAM N/A 08/28/2012   Procedure: LEFT HEART CATHETERIZATION WITH CORONARY ANGIOGRAM;  Surgeon: Pamella Pert, MD;  Location: [facility];  Service: Cardiovascular;  Laterality: N/A;   TEE WITHOUT CARDIOVERSION N/A 09/23/2020   Procedure: TRANSESOPHAGEAL ECHOCARDIOGRAM (TEE);  Surgeon: Lamar Blinks, MD;  Location: [facility];  Service: Cardiovascular;  Laterality: N/A;   WRIST SURGERY  1988    There were no vitals filed for this visit.   Subjective Assessment - 10/21/20 0938     Subjective Patient reports no recent near-falls or accidents in home. Patient reports doing "okay" with stairs utilizing bilateral handrails. He reports compliance with initial HEP.    Pertinent History 40 year old male with primary complaint of recurrent falls; L-sided weakness and imbalance s/p CVA (admitted for CVA 08/26/20). Patient is a 40 year old male with involved medical history previously seen for neck/shoulder pain. He was referred to ED with concerning neurological signs toward end of his course of PT. Patient had basal ganglia lacunar type infarct. Patient currently reports having frequent falls. His PCP thinks this may be due to L leg weakness. He reports having sudden falls with walking across his home and with going down front steps of his home. He states he often  falls backward onto his seat when going to stand. He reports some difficulty with dysphagia. He denies issue with choking on his food. Patient reports no major change in sensation. 5 steps to exit home. Patient had 2 handrails to get up steps. Patient reports having incidents of LOB while in the shower with his eyes closed. He reports more urinary urgency. Pt has 2 young kids that are often crawling around his household. [employer]. Pt reports some difficulty with car transfer- lifting his LLE.  No  previous PT for this condition. Directional pattern for falls: Posterior usually.    Limitations Walking;House hold activities;Standing    Diagnostic tests Brain MRI - see below    Patient Stated Goals Decreased falls, improved balance    Pain Onset 1 to 4 weeks ago                [facility] PT Assessment - 10/21/20 0940       Standardized Balance Assessment   Standardized Balance Assessment Berg Balance Test      Berg Balance Test   Sit to Stand Able to stand without using hands and stabilize independently    Standing Unsupported Able to stand safely 2 minutes    Sitting with Back Unsupported but Feet Supported on Floor or Stool Able to sit safely and securely 2 minutes    Stand to Sit Sits independently, has uncontrolled descent    Transfers Able to transfer safely, minor use of hands    Standing Unsupported with Eyes Closed Able to stand 10 seconds safely    Standing Unsupported with Feet Together Able to place feet together independently and stand for 1 minute with supervision    From Standing, Reach Forward with Outstretched Arm Can reach confidently >25 cm (10")    From Standing Position, Pick up Object from Floor Able to pick up shoe safely and easily    From Standing Position, Turn to Look Behind Over each Shoulder Looks behind from both sides and weight shifts well    Turn 360 Degrees Able to turn 360 degrees safely but slowly    Standing Unsupported, Alternately Place Feet on Step/Stool Able to complete 4 steps without aid or supervision    Standing Unsupported, One Foot in [employer] balance while stepping or standing    Standing on One Leg Able to lift leg independently and hold > 10 seconds    Total Score 44                  FUNCTIONAL OUTCOME MEASURES       Results Comments  BERG 44/56  Below cut-off  TUG 11 seconds  Above cut-off  5TSTS 13 seconds Fall risk  10 Meter Gait Speed Self-selected: s = 0.91 m/s; Fastest: s = 1.20 m/s  >norm for community  ambulator; below age-based norm  ABC Scale Next visit         TREATMENT   Physical Performance Testing - to assess for fall risk or functional activity limitations  -Performance of functional outcome measures (See above)    Neuromuscular Re-education - for improved postural control, sensory integration re-training, dynamic postural stability   Standing FT on [employer], eyes closed; 1x10sec, 2x20sec  Hurdle stepping, 3 6-inch hurdles; x6 D/B  Multidirectional stepping on blue star (lateral, anterolateral, anterior), bilateral LE; single step and then back to center; x5 each direction with each LE  Closed-chain dorsiflexion/anterior ankle strategy with lean on wall; x20    Therapeutic Activities - patient education,  HEP establishment; repetitive task practice for improved performance of daily functional activities e.g. transferring  For HEP update: Standing heel raise/toe raise, with bilateral UE support on // bars; x10 Sidelying hip abduction ;x10 [moderate cueing for positioning of LE and avoiding TFL compensation]   Patient education: discussion on results of fall risk screening/physical performance testing and HEP update/provision of printout.     ASSESSMENT Patient arrives with excellent motivation to participate in physical therapy. He demonstrates some visual dependence for maintaining postural stability and intermittent L knee hyperextension with mid-stance of gait. He has ongoing LLE weakness and postural stability deficits with intermittent lateral instability during gait and crossover stepping during intermittent episodes of LOB. He does tend to fall posteriorly during static standing postural control work. He has BERG within only 1 point of established cut-off score, good TUG performance, and gait speed less than established norm for his age group (although he is above established speed for community-level ambulation).  Patient has remaining deficits in sensory  integration, postural control, gait instability, LLE proprioceptive/kinesthetic deficits, and LLE weakness associated with recurrent falls and increased fall risk. Patient will benefit from continued skilled therapeutic intervention to address the above deficits as needed for improved function and QoL.        PT Short Term Goals - 10/14/20 1844       PT SHORT TERM GOAL #1   Title Pt will be independent with HEP in order to improve strength and balance in order to decrease fall risk and improve function at home and work.    Baseline 10/14/20: HEP initiated    Time 3    Period Weeks    Status New    Target Date 10/28/20      PT SHORT TERM GOAL #2   Title Pt will perform 5TSTS in less than 12 seconds indicative of no increased risk of falls based on established cut-off score    Baseline 10/14/20: 5TSTS in 13 sec    Time 3    Period Weeks    Status New    Target Date 11/04/20               PT Long Term Goals - 10/14/20 1127       PT LONG TERM GOAL #1   Title Patient will demonstrate improved function as evidenced by a score of 55 on FOTO measure for full participation in activities at home and in the community.    Baseline 10/14/20: 43    Time 8    Period Weeks    Status New    Target Date 12/09/20      PT LONG TERM GOAL #2   Title Pt will improve BERG by at least 3 points in order to demonstrate clinically significant improvement in balance.    Baseline 10/14/20: BERG to be performed next visit    Time 8    Period Weeks    Status New    Target Date 12/09/20      PT LONG TERM GOAL #3   Title Pt will improve ABC by at least 13% in order to demonstrate clinically significant improvement in balance confidence.    Baseline 10/14/20: ABC to be obtained next visit    Time 8    Period Weeks    Status New    Target Date 12/09/20      PT LONG TERM GOAL #4   Title Patient will perform stair ascent and descent with no UE support and reciprocal pattern with no  LOB and no major  movement deviation (on 4 steps in clinic) simulating negotiating steps to enter/exit home    Baseline 10/14/20: Pt sometimes having falls with stair descent    Time 8    Period Weeks    Status New    Target Date 12/09/20                   Plan - 10/21/20 1320     Clinical Impression Statement Patient arrives with excellent motivation to participate in physical therapy. He demonstrates some visual dependence for maintaining postural stability and intermittent L knee hyperextension with mid-stance of gait. He has ongoing LLE weakness and postural stability deficits with intermittent lateral instability during gait and crossover stepping during intermittent episodes of LOB. He does tend to fall posteriorly during static standing postural control work. He has BERG within only 1 point of established cut-off score, good TUG performance, and gait speed less than established norm for his age group (although he is above established speed for community-level ambulation).  Patient has remaining deficits in sensory integration, postural control, gait instability, LLE proprioceptive/kinesthetic deficits, and LLE weakness associated with recurrent falls and increased fall risk. Patient will benefit from continued skilled therapeutic intervention to address the above deficits as needed for improved function and QoL.    Personal Factors and Comorbidities Comorbidity 3+;Fitness;Profession    Comorbidities obesity, Type 2 DM, Hx of MI and cardiac stent, Hx of CVA, high cholesterol, smoking 1 pack/day    Examination-Activity Limitations Caring for Others;Stairs;Transfers;Locomotion Level;Bathing    Examination-Participation Restrictions Occupation;Interpersonal Relationship    Stability/Clinical Decision Making Evolving/Moderate complexity    Rehab Potential Good    PT Frequency 2x / week    PT Duration 8 weeks    PT Treatment/Interventions Therapeutic activities;Therapeutic exercise;Neuromuscular  re-education;Manual techniques;Patient/family education;Stair training;Gait training;Functional mobility training;Balance training    PT Next Visit Plan Progress with balance training, obstacle negotiation, reactive postural control, LLE proprioceptive training, LE strengthening.    PT Home Exercise Plan HEP Access Code [email or web address]    Consulted and Agree with Plan of Care Patient             Patient will benefit from skilled therapeutic intervention in order to improve the following deficits and impairments:  Decreased strength, Postural dysfunction, Decreased safety awareness, Decreased coordination, Abnormal gait, Decreased balance  Visit Diagnosis: Repeated falls  Muscle weakness (generalized)  Imbalance     Problem List Patient Active Problem List   Diagnosis Date Noted   Class 2 severe obesity due to excess calories with serious comorbidity and body mass index (BMI) of 36.0 to 36.9 in adult [facility]) 10/08/2020   Recurrent strokes (HCC) 08/27/2020   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 [place], [place], [address] Phone: [phone]   Fax:  [phone]  Name: NASON CONRADT MRN: [id number] Date of Birth: Feb 19, 1980

## 2020-10-26 ENCOUNTER — Ambulatory Visit: Payer: BC Managed Care – PPO | Admitting: Physical Therapy

## 2020-10-26 ENCOUNTER — Other Ambulatory Visit: Payer: Self-pay

## 2020-10-26 ENCOUNTER — Encounter: Payer: Self-pay | Admitting: Physical Therapy

## 2020-10-26 DIAGNOSIS — R2689 Other abnormalities of gait and mobility: Secondary | ICD-10-CM

## 2020-10-26 DIAGNOSIS — M542 Cervicalgia: Secondary | ICD-10-CM | POA: Diagnosis not present

## 2020-10-26 DIAGNOSIS — M6281 Muscle weakness (generalized): Secondary | ICD-10-CM

## 2020-10-26 DIAGNOSIS — R296 Repeated falls: Secondary | ICD-10-CM

## 2020-10-26 DIAGNOSIS — M25511 Pain in right shoulder: Secondary | ICD-10-CM | POA: Diagnosis not present

## 2020-10-26 NOTE — Therapy (Signed)
[facility] [address] [place], [place], [address] Phone: [phone]   Fax:  [phone]  Physical Therapy Treatment  Patient Details  Name: Martin Mullen MRN: [id number] Date of Birth: [birth year] Referring Provider (PT): [name], NP   Encounter Date: 10/26/2020   PT End of Session - 10/26/20 0932     Visit Number 3    Number of Visits 17    Date for PT Re-Evaluation 12/09/20    Authorization Type [employer]; VL 60 visits PT/OT/Chiro combined per calendar year    Progress Note Due on Visit 17    PT Start Time 0930    PT Stop Time 1014    PT Time Calculation (min) 44 min    Equipment Utilized During Treatment Gait belt    Activity Tolerance Patient tolerated treatment well    Behavior During Therapy [facility] for tasks assessed/performed             Past Medical History:  Diagnosis Date   Acute transmural inferior wall MI (HCC) 08/28/2012   .5 x 12 mm Veri-flex stent non-DES.   Chicken pox    Coronary artery disease    Inferior ST elevation myocardial infarction in July of 2014. Cardiac catheterization showed 95% distal RCA stenosis and 90% first diagonal stenosis with normal ejection fraction. He underwent PCI in bare-metal stent placement to the distal RCA.   Diabetes mellitus without complication (HCC)    Hypercholesterolemia    Stroke (HCC)    Tobacco use     Past Surgical History:  Procedure Laterality Date   CORONARY ANGIOPLASTY WITH STENT PLACEMENT     DEBRIDEMENT AND CLOSURE WOUND N/A 09/03/2019   Procedure: Debridement and closure of scrotal wound;  Surgeon: Allena Napoleon, MD;  Location: [facility];  Service: Plastics;  Laterality: N/A;   DEBRIDEMENT AND CLOSURE WOUND N/A 07/25/2019   Procedure: DEBRIDEMENT AND CLOSURE WOUND;  Surgeon: Allena Napoleon, MD;  Location: [facility];  Service: Plastics;  Laterality: N/A;   INCISION AND DRAINAGE ABSCESS N/A 07/20/2019   Procedure: INCISION AND DRAINAGE  ABSCESS;  Surgeon: Crista Elliot, MD;  Location: [facility];  Service: Urology;  Laterality: N/A;   LEFT HEART CATHETERIZATION WITH CORONARY ANGIOGRAM N/A 08/28/2012   Procedure: LEFT HEART CATHETERIZATION WITH CORONARY ANGIOGRAM;  Surgeon: Pamella Pert, MD;  Location: [facility] C[facility];  Service: Cardiovascular;  Laterality: N/A;   TEE WITHOUT CARDIOVERSION N/A 09/23/2020   Procedure: TRANSESOPHAGEAL ECHOCARDIOGRAM (TEE);  Surgeon: Lamar Blinks, MD;  Location: [facility];  Service: Cardiovascular;  Laterality: N/A;   WRIST SURGERY  1988    There were no vitals filed for this visit.   Subjective Assessment - 10/26/20 0937     Subjective Patient denies recent falls or major issues with stair negotiation. He reports his twin babies sometimes crawl near his feet and this may lead to LOB. He reports incident yesterday in which he fell into baby gate when his daughter was crawling at his feet. Patient reports compliance with updated HEP.    Pertinent History 40 year old male with primary complaint of recurrent falls; L-sided weakness and imbalance s/p CVA (admitted for CVA 08/26/20). Patient is a 40 year old male with involved medical history previously seen for neck/shoulder pain. He was referred to ED with concerning neurological signs toward end of his course of PT. Patient had basal ganglia lacunar type infarct. Patient currently reports having frequent falls. His PCP thinks this may be due  to L leg weakness. He reports having sudden falls with walking across his home and with going down front steps of his home. He states he often falls backward onto his seat when going to stand. He reports some difficulty with dysphagia. He denies issue with choking on his food. Patient reports no major change in sensation. 5 steps to exit home. Patient had 2 handrails to get up steps. Patient reports having incidents of LOB while in the shower with his eyes closed. He reports more urinary urgency. Pt has 2 young kids  that are often crawling around his household. [employer]. Pt reports some difficulty with car transfer- lifting his LLE.  No previous PT for this condition. Directional pattern for falls: Posterior usually.    Limitations Walking;House hold activities;Standing    Diagnostic tests Brain MRI - see below    Patient Stated Goals Decreased falls, improved balance    Pain Onset 1 to 4 weeks ago               ABC Scale: 68.1%     NuStep x 5 minutes, seat at 8, Level 3 - at beginning of session for warm-up to improve muscular performance/recruitment (unbilled time)   TREATMENT     Neuromuscular Re-education - for improved postural control, sensory integration re-training, dynamic postural stability    Standing FT on Airex, eyes closed; 1x25sec, 2x30sec [tendency to fall posteriorly]   Hurdle stepping, 3 6-inch hurdles; x6 D/B  Lateral hurdle step-over, with both feet over and back; 2x10, 6-inch hurdle   Multidirectional stepping on blue star (lateral, anterolateral, anterior), bilateral LE; single step and then back to center; x10 each direction with each LE   Standing FT with perturbations; 2 x 1 min bouts. Multi-directional random perturbations (including single and combined planes)    *not today* Closed-chain dorsiflexion/anterior ankle strategy with lean on wall; x20       Therapeutic Activities - patient education, HEP establishment; repetitive task practice for improved performance of daily functional activities e.g. transferring  Standing toe tap, 6-inch step; 2x10 alternating (standing at base of stairs)  Sit to stand; with Airex under feet; 2x8  Patient education: discussed findings of ABC scale and expected progression for outcome measures with PT. Discussed utilization of playpen or other device to keep children stationary as able when completing tasks in his home and to ensure he is "planning" prior to moving from stationary position or entering room.      *not today*  For HEP update: Standing heel raise/toe raise, with bilateral UE support on // bars; x10 Sidelying hip abduction ;x10 [moderate cueing for positioning of LE and avoiding TFL compensation]        ASSESSMENT Patient arrives with excellent motivation to participate in physical therapy. Completed ABC scale today to enable for future assessment of confidence related to balance/falls. Patient has improved ability to maintain static postural control with feet together and eyes closed on compliant surface today as demonstrated by improved duration. He has tendency to fall posteriorly during episodes of LOB and has some difficulty with targeting L>RLE during hurdles/obstacle negotiation. Patient has remaining deficits in sensory integration, postural control, gait instability, LLE proprioceptive/kinesthetic deficits, and LLE weakness associated with recurrent falls and increased fall risk. Patient will benefit from continued skilled therapeutic intervention to address the above deficits as needed for improved function and QoL.       PT Short Term Goals - 10/14/20 1844       PT SHORT TERM GOAL #  1   Title Pt will be independent with HEP in order to improve strength and balance in order to decrease fall risk and improve function at home and work.    Baseline 10/14/20: HEP initiated    Time 3    Period Weeks    Status New    Target Date 10/28/20      PT SHORT TERM GOAL #2   Title Pt will perform 5TSTS in less than 12 seconds indicative of no increased risk of falls based on established cut-off score    Baseline 10/14/20: 5TSTS in 13 sec    Time 3    Period Weeks    Status New    Target Date 11/04/20               PT Long Term Goals - 10/26/20 1051       PT LONG TERM GOAL #1   Title Patient will demonstrate improved function as evidenced by a score of 55 on FOTO measure for full participation in activities at home and in the community.    Baseline 10/14/20: 43    Time  8    Period Weeks    Status New      PT LONG TERM GOAL #2   Title Pt will improve BERG by at least 3 points in order to demonstrate clinically significant improvement in balance.    Baseline 10/14/20: BERG to be performed next visit. 10/21/20: BERG 44    Time 8    Period Weeks    Status New      PT LONG TERM GOAL #3   Title Pt will improve ABC by at least 13% in order to demonstrate clinically significant improvement in balance confidence.    Baseline 10/14/20: ABC to be obtained next visit. 10/26/20: 68.1%    Time 8    Period Weeks    Status New    Target Date 12/09/20      PT LONG TERM GOAL #4   Title Patient will perform stair ascent and descent with no UE support and reciprocal pattern with no LOB and no major movement deviation (on 4 steps in clinic) simulating negotiating steps to enter/exit home    Baseline 10/14/20: Pt sometimes having falls with stair descent    Time 8    Period Weeks    Status New    Target Date 12/09/20                   Plan - 10/26/20 1125     Clinical Impression Statement Patient arrives with excellent motivation to participate in physical therapy. Completed ABC scale today to enable for future assessment of confidence related to balance/falls. Patient has improved ability to maintain static postural control with feet together and eyes closed on compliant surface today as demonstrated by improved duration. He has tendency to fall posteriorly during episodes of LOB and has some difficulty with targeting L>RLE during hurdles/obstacle negotiation. Patient has remaining deficits in sensory integration, postural control, gait instability, LLE proprioceptive/kinesthetic deficits, and LLE weakness associated with recurrent falls and increased fall risk. Patient will benefit from continued skilled therapeutic intervention to address the above deficits as needed for improved function and QoL.    Personal Factors and Comorbidities Comorbidity  3+;Fitness;Profession    Comorbidities obesity, Type 2 DM, Hx of MI and cardiac stent, Hx of CVA, high cholesterol, smoking 1 pack/day    Examination-Activity Limitations Caring for Others;Stairs;Transfers;Locomotion Level;Bathing    Examination-Participation Restrictions Occupation;Interpersonal Relationship  Stability/Clinical Decision Making Evolving/Moderate complexity    Rehab Potential Good    PT Frequency 2x / week    PT Duration 8 weeks    PT Treatment/Interventions Therapeutic activities;Therapeutic exercise;Neuromuscular re-education;Manual techniques;Patient/family education;Stair training;Gait training;Functional mobility training;Balance training    PT Next Visit Plan Progress with balance training, obstacle negotiation, reactive postural control, LLE proprioceptive training, LE strengthening.    PT Home Exercise Plan HEP Access Code [email or web address]    Consulted and Agree with Plan of Care Patient             Patient will benefit from skilled therapeutic intervention in order to improve the following deficits and impairments:  Decreased strength, Postural dysfunction, Decreased safety awareness, Decreased coordination, Abnormal gait, Decreased balance  Visit Diagnosis: Repeated falls  Muscle weakness (generalized)  Imbalance     Problem List Patient Active Problem List   Diagnosis Date Noted   Class 2 severe obesity due to excess calories with serious comorbidity and body mass index (BMI) of 36.0 to 36.9 in adult [facility]) 10/08/2020   Recurrent strokes (HCC) 08/27/2020   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 [place], [place], [address] Phone: [phone]   Fax:  [phone]  Name: WINSON EICHORN MRN: [id number] Date of Birth: 1980-09-23

## 2020-10-28 ENCOUNTER — Ambulatory Visit: Payer: BC Managed Care – PPO | Admitting: Physical Therapy

## 2020-10-28 ENCOUNTER — Other Ambulatory Visit: Payer: Self-pay

## 2020-10-28 VITALS — BP 128/68

## 2020-10-28 DIAGNOSIS — M542 Cervicalgia: Secondary | ICD-10-CM | POA: Diagnosis not present

## 2020-10-28 DIAGNOSIS — M25511 Pain in right shoulder: Secondary | ICD-10-CM | POA: Diagnosis not present

## 2020-10-28 DIAGNOSIS — R296 Repeated falls: Secondary | ICD-10-CM

## 2020-10-28 DIAGNOSIS — R2689 Other abnormalities of gait and mobility: Secondary | ICD-10-CM | POA: Diagnosis not present

## 2020-10-28 DIAGNOSIS — M6281 Muscle weakness (generalized): Secondary | ICD-10-CM | POA: Diagnosis not present

## 2020-10-28 NOTE — Therapy (Signed)
[facility] [address] [place], [place], [address] Phone: [phone]   Fax:  [phone]  Physical Therapy Treatment  Patient Details  Name: Martin Mullen MRN: [id number] Date of Birth: 07/04/1980 Referring Provider (PT): [name], NP   Encounter Date: 10/28/2020   PT End of Session - 10/28/20 0930     Visit Number 4    Number of Visits 17    Date for PT Re-Evaluation 12/09/20    Authorization Type BCBS; VL 60 visits PT/OT/Chiro combined per calendar year    Progress Note Due on Visit 17    PT Start Time 0926    PT Stop Time 1010    PT Time Calculation (min) 44 min    Equipment Utilized During Treatment Gait belt    Activity Tolerance Patient tolerated treatment well    Behavior During Therapy [facility] for tasks assessed/performed             Past Medical History:  Diagnosis Date   Acute transmural inferior wall MI (HCC) 08/28/2012   .5 x 12 mm Veri-flex stent non-DES.   Chicken pox    Coronary artery disease    Inferior ST elevation myocardial infarction in July of 2014. Cardiac catheterization showed 95% distal RCA stenosis and 90% first diagonal stenosis with normal ejection fraction. He underwent PCI in bare-metal stent placement to the distal RCA.   Diabetes mellitus without complication (HCC)    Hypercholesterolemia    Stroke (HCC)    Tobacco use     Past Surgical History:  Procedure Laterality Date   CORONARY ANGIOPLASTY WITH STENT PLACEMENT     DEBRIDEMENT AND CLOSURE WOUND N/A 09/03/2019   Procedure: Debridement and closure of scrotal wound;  Surgeon: Allena Napoleon, MD;  Location: [facility];  Service: Plastics;  Laterality: N/A;   DEBRIDEMENT AND CLOSURE WOUND N/A 07/25/2019   Procedure: DEBRIDEMENT AND CLOSURE WOUND;  Surgeon: Allena Napoleon, MD;  Location: [facility];  Service: Plastics;  Laterality: N/A;   INCISION AND DRAINAGE ABSCESS N/A 07/20/2019   Procedure: INCISION AND DRAINAGE  ABSCESS;  Surgeon: Crista Elliot, MD;  Location: [facility];  Service: Urology;  Laterality: N/A;   LEFT HEART CATHETERIZATION WITH CORONARY ANGIOGRAM N/A 08/28/2012   Procedure: LEFT HEART CATHETERIZATION WITH CORONARY ANGIOGRAM;  Surgeon: Pamella Pert, MD;  Location: [facility] CATH LAB;  Service: Cardiovascular;  Laterality: N/A;   TEE WITHOUT CARDIOVERSION N/A 09/23/2020   Procedure: TRANSESOPHAGEAL ECHOCARDIOGRAM (TEE);  Surgeon: Lamar Blinks, MD;  Location: [facility];  Service: Cardiovascular;  Laterality: N/A;   WRIST SURGERY  1988    Vitals:   10/28/20 1133  BP: 128/68     Subjective Assessment - 10/28/20 0931     Subjective Patient reports having fall this morning when trying to get around baby gate. He denies having LOB from kids being close to his feet or with negotiationg threshold to get into home recently. He reports no ankle pain, upper limb pain, or hip pain following incident this AM. He reports compliance with his HEP.    Pertinent History 40 year old male with primary complaint of recurrent falls; L-sided weakness and imbalance s/p CVA (admitted for CVA 08/26/20). Patient is a 40 year old male with involved medical history previously seen for neck/shoulder pain. He was referred to ED with concerning neurological signs toward end of his course of PT. Patient had basal ganglia lacunar type infarct. Patient currently reports having frequent falls. His PCP  thinks this may be due to L leg weakness. He reports having sudden falls with walking across his home and with going down front steps of his home. He states he often falls backward onto his seat when going to stand. He reports some difficulty with dysphagia. He denies issue with choking on his food. Patient reports no major change in sensation. 5 steps to exit home. Patient had 2 handrails to get up steps. Patient reports having incidents of LOB while in the shower with his eyes closed. He reports more urinary urgency. Pt has 2  young kids that are often crawling around his household. [employer]. Pt reports some difficulty with car transfer- lifting his LLE.  No previous PT for this condition. Directional pattern for falls: Posterior usually.    Limitations Walking;House hold activities;Standing    Diagnostic tests Brain MRI - see below    Patient Stated Goals Decreased falls, improved balance    Pain Onset 1 to 4 weeks ago             Observation Pt has no erythema, ecchymosis, edema, or other abnormality along L shoulder complex or R arm.  Palpation No point tenderness of L shoulder complex or proximal humerus        NuStep x 5 minutes, seat at 9, Level 4 - at beginning of session for warm-up to improve muscular performance/recruitment (unbilled time)     TREATMENT     Neuromuscular Re-education - for improved postural control, sensory integration re-training, dynamic postural stability    In // bars:  Standing Semitandem on Airex, eyes closed; 2x20sec each [tendency to fall posteriorly]   Hurdle stepping, 3 6-inch hurdles; x6 D/B   Lateral hurdle step-over, with both feet over and back; 2x10, 6-inch hurdle   Standing FT with perturbations; 2 x 1 min bouts. Multi-directional random perturbations (including single and combined planes)  _____________   L forward step up with R toe tap onto 2nd step; 6-inch step (using staircase in center of gym); 2x10  Total Gym single-limb squat; Level 22; 2x10        *not today* Multidirectional stepping on blue star (lateral, anterolateral, anterior), bilateral LE; single step and then back to center; x10 each direction with each LE Closed-chain dorsiflexion/anterior ankle strategy with lean on wall; x20       Therapeutic Activities - patient education, HEP establishment; repetitive task practice for improved performance of daily functional activities e.g. transferring   In // bars:  3-way toe tap on cone, 6-inch step; 2x10 alternating  (standing at base of stairs)   Sit to stand; with Airex under feet; 2x8   Patient education: discussed monitoring blood pressure at home daily. Discussed following up with MD if L shoulder has significant increase in pain or if patent experiences loss of active motion or function of L upper limb.    *not today*  Standing toe tap, 6-inch step; 2x10 alternating (standing at base of stairs) For HEP update: Standing heel raise/toe raise, with bilateral UE support on // bars; x10 Sidelying hip abduction ;x10 [moderate cueing for positioning of LE and avoiding TFL compensation]         ASSESSMENT Patient arrives with excellent motivation to participate in physical therapy. Patient had fall in his home this morning while attempting to move around small opening in baby gate in his home. He reports mild soreness of his L proximal arm. No signs of serious soft tissue injury or fracture at this time; pt is still  able to functionally use L upper limb without complaint of pain. He is notably challenged with advanced static balance work with modified BOS with eyes closed. Progressed with further work on improving LLE strength and single-leg stability (step-up with contralateral lower limb toe tap on next step). Patient has remaining deficits in sensory integration, postural control, gait instability, LLE proprioceptive/kinesthetic deficits, and LLE weakness associated with recurrent falls and increased fall risk. Patient will benefit from continued skilled therapeutic intervention to address the above deficits as needed for improved function and QoL.       PT Short Term Goals - 10/14/20 1844       PT SHORT TERM GOAL #1   Title Pt will be independent with HEP in order to improve strength and balance in order to decrease fall risk and improve function at home and work.    Baseline 10/14/20: HEP initiated    Time 3    Period Weeks    Status New    Target Date 10/28/20      PT SHORT TERM GOAL #2    Title Pt will perform 5TSTS in less than 12 seconds indicative of no increased risk of falls based on established cut-off score    Baseline 10/14/20: 5TSTS in 13 sec    Time 3    Period Weeks    Status New    Target Date 11/04/20               PT Long Term Goals - 10/26/20 1051       PT LONG TERM GOAL #1   Title Patient will demonstrate improved function as evidenced by a score of 55 on FOTO measure for full participation in activities at home and in the community.    Baseline 10/14/20: 43    Time 8    Period Weeks    Status New      PT LONG TERM GOAL #2   Title Pt will improve BERG by at least 3 points in order to demonstrate clinically significant improvement in balance.    Baseline 10/14/20: BERG to be performed next visit. 10/21/20: BERG 44    Time 8    Period Weeks    Status New      PT LONG TERM GOAL #3   Title Pt will improve ABC by at least 13% in order to demonstrate clinically significant improvement in balance confidence.    Baseline 10/14/20: ABC to be obtained next visit. 10/26/20: 68.1%    Time 8    Period Weeks    Status New    Target Date 12/09/20      PT LONG TERM GOAL #4   Title Patient will perform stair ascent and descent with no UE support and reciprocal pattern with no LOB and no major movement deviation (on 4 steps in clinic) simulating negotiating steps to enter/exit home    Baseline 10/14/20: Pt sometimes having falls with stair descent    Time 8    Period Weeks    Status New    Target Date 12/09/20                   Plan - 10/28/20 1135     Clinical Impression Statement Patient arrives with excellent motivation to participate in physical therapy. Patient had fall in his home this morning while attempting to move around small opening in baby gate in his home. He reports mild soreness of his L proximal arm. No signs of serious soft tissue injury  or fracture at this time; pt is still able to functionally use L upper limb without complaint of  pain. He is notably challenged with advanced static balance work with modified BOS with eyes closed. Progressed with further work on improving LLE strength and single-leg stability (step-up with contralateral lower limb toe tap on next step). Patient has remaining deficits in sensory integration, postural control, gait instability, LLE proprioceptive/kinesthetic deficits, and LLE weakness associated with recurrent falls and increased fall risk. Patient will benefit from continued skilled therapeutic intervention to address the above deficits as needed for improved function and QoL.    Personal Factors and Comorbidities Comorbidity 3+;Fitness;Profession    Comorbidities obesity, Type 2 DM, Hx of MI and cardiac stent, Hx of CVA, high cholesterol, smoking 1 pack/day    Examination-Activity Limitations Caring for Others;Stairs;Transfers;Locomotion Level;Bathing    Examination-Participation Restrictions Occupation;Interpersonal Relationship    Stability/Clinical Decision Making Evolving/Moderate complexity    Rehab Potential Good    PT Frequency 2x / week    PT Duration 8 weeks    PT Treatment/Interventions Therapeutic activities;Therapeutic exercise;Neuromuscular re-education;Manual techniques;Patient/family education;Stair training;Gait training;Functional mobility training;Balance training    PT Next Visit Plan Progress with balance training, obstacle negotiation, reactive postural control, LLE proprioceptive training, LE strengthening.    PT Home Exercise Plan HEP Access Code [email or web address]    Consulted and Agree with Plan of Care Patient             Patient will benefit from skilled therapeutic intervention in order to improve the following deficits and impairments:  Decreased strength, Postural dysfunction, Decreased safety awareness, Decreased coordination, Abnormal gait, Decreased balance  Visit Diagnosis: Repeated falls  Muscle weakness (generalized)  Imbalance     Problem List Patient  Active Problem List   Diagnosis Date Noted   Class 2 severe obesity due to excess calories with serious comorbidity and body mass index (BMI) of 36.0 to 36.9 in adult [facility] 10/08/2020   Recurrent strokes (HCC) 08/27/2020   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 [place], [place], [address] Phone: [phone]   Fax:  [phone]  Name: Martin Mullen MRN: [id number] Date of Birth: 06-17-1980

## 2020-11-02 ENCOUNTER — Encounter: Payer: BC Managed Care – PPO | Admitting: Physical Therapy

## 2020-11-04 ENCOUNTER — Other Ambulatory Visit: Payer: Self-pay

## 2020-11-04 ENCOUNTER — Encounter: Payer: Self-pay | Admitting: Physical Therapy

## 2020-11-04 ENCOUNTER — Ambulatory Visit: Payer: BC Managed Care – PPO | Admitting: Physical Therapy

## 2020-11-04 DIAGNOSIS — M25511 Pain in right shoulder: Secondary | ICD-10-CM

## 2020-11-04 DIAGNOSIS — R2689 Other abnormalities of gait and mobility: Secondary | ICD-10-CM

## 2020-11-04 DIAGNOSIS — M6281 Muscle weakness (generalized): Secondary | ICD-10-CM | POA: Diagnosis not present

## 2020-11-04 DIAGNOSIS — R296 Repeated falls: Secondary | ICD-10-CM | POA: Diagnosis not present

## 2020-11-04 DIAGNOSIS — M542 Cervicalgia: Secondary | ICD-10-CM

## 2020-11-04 NOTE — Therapy (Signed)
[facility] [address] [place], [place], [address] Phone: [phone]   Fax:  [phone]  Physical Therapy Treatment  Patient Details  Name: ELDRICK PENICK MRN: [id number] Date of Birth: 02/09/1981 Referring Provider (PT): [name], NP   Encounter Date: 11/04/2020   PT End of Session - 11/04/20 1123     Visit Number 5    Number of Visits 17    Date for PT Re-Evaluation 12/09/20    Authorization Type [employer]; VL 60 visits PT/OT/Chiro combined per calendar year    Progress Note Due on Visit 17    PT Start Time 0941    PT Stop Time 1015    PT Time Calculation (min) 34 min    Equipment Utilized During Treatment Gait belt    Activity Tolerance Patient tolerated treatment well    Behavior During Therapy [facility] for tasks assessed/performed             Past Medical History:  Diagnosis Date   Acute transmural inferior wall MI (HCC) 08/28/2012   .5 x 12 mm Veri-flex stent non-DES.   Chicken pox    Coronary artery disease    Inferior ST elevation myocardial infarction in July of 2014. Cardiac catheterization showed 95% distal RCA stenosis and 90% first diagonal stenosis with normal ejection fraction. He underwent PCI in bare-metal stent placement to the distal RCA.   Diabetes mellitus without complication (HCC)    Hypercholesterolemia    Stroke (HCC)    Tobacco use     Past Surgical History:  Procedure Laterality Date   CORONARY ANGIOPLASTY WITH STENT PLACEMENT     DEBRIDEMENT AND CLOSURE WOUND N/A 09/03/2019   Procedure: Debridement and closure of scrotal wound;  Surgeon: Allena Napoleon, MD;  Location: [facility];  Service: Plastics;  Laterality: N/A;   DEBRIDEMENT AND CLOSURE WOUND N/A 07/25/2019   Procedure: DEBRIDEMENT AND CLOSURE WOUND;  Surgeon: Allena Napoleon, MD;  Location: [facility];  Service: Plastics;  Laterality: N/A;   INCISION AND DRAINAGE ABSCESS N/A 07/20/2019   Procedure: INCISION AND DRAINAGE  ABSCESS;  Surgeon: Crista Elliot, MD;  Location: [facility];  Service: Urology;  Laterality: N/A;   LEFT HEART CATHETERIZATION WITH CORONARY ANGIOGRAM N/A 08/28/2012   Procedure: LEFT HEART CATHETERIZATION WITH CORONARY ANGIOGRAM;  Surgeon: Pamella Pert, MD;  Location: [facility] CATH LAB;  Service: Cardiovascular;  Laterality: N/A;   TEE WITHOUT CARDIOVERSION N/A 09/23/2020   Procedure: TRANSESOPHAGEAL ECHOCARDIOGRAM (TEE);  Surgeon: Lamar Blinks, MD;  Location: [facility];  Service: Cardiovascular;  Laterality: N/A;   WRIST SURGERY  1988    There were no vitals filed for this visit.   Subjective Assessment - 11/04/20 0944     Subjective Patient reports falling out of bed yesterday morning; unsure of how it happened as he woke up when he hit the ground. He reports mild pain in left ribs (6/10 pain) and left wrist (1/10 pain), pt states he did not hit his head. He reports compliance with his HEP.    Pertinent History 40 year old male with primary complaint of recurrent falls; L-sided weakness and imbalance s/p CVA (admitted for CVA 08/26/20). Patient is a 40 year old male with involved medical history previously seen for neck/shoulder pain. He was referred to ED with concerning neurological signs toward end of his course of PT. Patient had basal ganglia lacunar type infarct. Patient currently reports having frequent falls. His PCP thinks this may be due to  L leg weakness. He reports having sudden falls with walking across his home and with going down front steps of his home. He states he often falls backward onto his seat when going to stand. He reports some difficulty with dysphagia. He denies issue with choking on his food. Patient reports no major change in sensation. 5 steps to exit home. Patient had 2 handrails to get up steps. Patient reports having incidents of LOB while in the shower with his eyes closed. He reports more urinary urgency. Pt has 2 young kids that are often crawling around his  household. [employer]. Pt reports some difficulty with car transfer- lifting his LLE.  No previous PT for this condition. Directional pattern for falls: Posterior usually.    Limitations Walking;House hold activities;Standing    Diagnostic tests Brain MRI - see below    Patient Stated Goals Decreased falls, improved balance    Currently in Pain? Yes    Pain Score 6     Pain Location Rib cage    Pain Orientation Left    Pain Onset 1 to 4 weeks ago               NuStep x 5 minutes, seat at 9, Level 4 - at beginning of session for warm-up to improve muscular performance/recruitment (unbilled time)     TREATMENT     Neuromuscular Re-education - for improved postural control, sensory integration re-training, dynamic postural stability    In // bars:  Standing Semitandem on Airex, eyes closed; 2x20sec each [tendency to fall posteriorly]   Hurdle stepping, 3 6-inch hurdles; x6 D/B   Lateral hurdle step, 3 6-inch hurdles; x6 D/B  Lateral hurdle step-over, with brief SL balance; 2x10, alternating, 6-inch hurdle   Standing FT with perturbations; 2 x 1 min bouts. Multi-directional random perturbations (including single and combined planes)           *not today* Multidirectional stepping on blue star (lateral, anterolateral, anterior), bilateral LE; single step and then back to center; x10 each direction with each LE Closed-chain dorsiflexion/anterior ankle strategy with lean on wall; x20 [employer] single-limb squat; Level 22; 2x10 L forward step up with R toe tap onto 2nd step; 6-inch step (using staircase in center of gym); 2x10     Therapeutic Activities - patient education, HEP establishment; repetitive task practice for improved performance of daily functional activities e.g. transferring   In // bars:    Sit to stand; with Airex under feet; 2x8   Patient education: to follow up with PCP if pain persists or worsens s/p fall out of bed yesterday morning with patient  reporting pain in left chest/anterior rib cage over 5th and 6th ribs and increased pain with LUE overhead movements.     *not today*  Standing toe tap, 6-inch step; 2x10 alternating (standing at base of stairs) For HEP update: Standing heel raise/toe raise, with bilateral UE support on // bars; x10 Sidelying hip abduction; x10 [moderate cueing for positioning of LE and avoiding TFL compensation] 3-way toe tap on cone, 6-inch step; 2x10 alternating (standing at base of stairs)       ASSESSMENT Patient demonstrates excellent motivation throughout physical therapy session. He did arrive late, limiting  the number of interventions performed today. He did report another fall since last session; PT observed skin and deeper tissues via palpation over left chest where pt reported pain. No ecchymosis, swelling or redness however increased tenderness with palpation over anterior 5th and 6th ribs. He also  reports increased pain in that area with overhead movements; no report of shoulder pain. Pt educated to monitor symptoms and contact primary care if pain persists. He demo difficulty with left-sided single leg stance during hurdle activities. A new stability challenge was introduced to encourage SLS. Patient has remaining deficits in sensory integration, postural control, gait instability, LLE proprioceptive/kinesthetic deficits, and LLE weakness associated with recurrent falls and increased fall risk. Patient will benefit from continued skilled therapeutic intervention to address the above deficits as needed for improved function and QoL.                 PT Short Term Goals - 10/14/20 1844       PT SHORT TERM GOAL #1   Title Pt will be independent with HEP in order to improve strength and balance in order to decrease fall risk and improve function at home and work.    Baseline 10/14/20: HEP initiated    Time 3    Period Weeks    Status New    Target Date 10/28/20      PT SHORT TERM GOAL  #2   Title Pt will perform 5TSTS in less than 12 seconds indicative of no increased risk of falls based on established cut-off score    Baseline 10/14/20: 5TSTS in 13 sec    Time 3    Period Weeks    Status New    Target Date 11/04/20               PT Long Term Goals - 10/26/20 1051       PT LONG TERM GOAL #1   Title Patient will demonstrate improved function as evidenced by a score of 55 on FOTO measure for full participation in activities at home and in the community.    Baseline 10/14/20: 43    Time 8    Period Weeks    Status New      PT LONG TERM GOAL #2   Title Pt will improve BERG by at least 3 points in order to demonstrate clinically significant improvement in balance.    Baseline 10/14/20: BERG to be performed next visit. 10/21/20: BERG 44    Time 8    Period Weeks    Status New      PT LONG TERM GOAL #3   Title Pt will improve ABC by at least 13% in order to demonstrate clinically significant improvement in balance confidence.    Baseline 10/14/20: ABC to be obtained next visit. 10/26/20: 68.1%    Time 8    Period Weeks    Status New    Target Date 12/09/20      PT LONG TERM GOAL #4   Title Patient will perform stair ascent and descent with no UE support and reciprocal pattern with no LOB and no major movement deviation (on 4 steps in clinic) simulating negotiating steps to enter/exit home    Baseline 10/14/20: Pt sometimes having falls with stair descent    Time 8    Period Weeks    Status New    Target Date 12/09/20                   Plan - 11/04/20 1124     Clinical Impression Statement Patient demonstrates excellent motivation throughout physical therapy session. He did arrive late, limiting  the number of interventions performed today. He did report another fall since last session; PT observed skin and deeper tissues via palpation over left  chest where pt reported pain. No ecchymosis, swelling or redness however increased tenderness with palpation  over anterior 5th and 6th ribs. He also reports increased pain in that area with overhead movements; no report of shoulder pain. Pt educated to monitor symptoms and contact primary care if pain persists. He demo difficulty with left-sided single leg stance during hurdle activities. A new stability challenge was introduced to encourage SLS. Patient has remaining deficits in sensory integration, postural control, gait instability, LLE proprioceptive/kinesthetic deficits, and LLE weakness associated with recurrent falls and increased fall risk. Patient will benefit from continued skilled therapeutic intervention to address the above deficits as needed for improved function and QoL.    Personal Factors and Comorbidities Comorbidity 3+;Fitness;Profession    Comorbidities obesity, Type 2 DM, Hx of MI and cardiac stent, Hx of CVA, high cholesterol, smoking 1 pack/day    Examination-Activity Limitations Caring for Others;Stairs;Transfers;Locomotion Level;Bathing    Examination-Participation Restrictions Occupation;Interpersonal Relationship    Stability/Clinical Decision Making Evolving/Moderate complexity    Rehab Potential Good    PT Frequency 2x / week    PT Duration 8 weeks    PT Treatment/Interventions Therapeutic activities;Therapeutic exercise;Neuromuscular re-education;Manual techniques;Patient/family education;Stair training;Gait training;Functional mobility training;Balance training    PT Next Visit Plan Progress with balance training, obstacle negotiation, reactive postural control, LLE proprioceptive training, LE strengthening.    PT Home Exercise Plan HEP Access Code [email or web address]    Consulted and Agree with Plan of Care Patient             Patient will benefit from skilled therapeutic intervention in order to improve the following deficits and impairments:  Decreased strength, Postural dysfunction, Decreased safety awareness, Decreased coordination, Abnormal gait, Decreased balance  Visit  Diagnosis: Repeated falls  Muscle weakness (generalized)  Imbalance  Cervicalgia  Acute pain of right shoulder     Problem List Patient Active Problem List   Diagnosis Date Noted   Class 2 severe obesity due to excess calories with serious comorbidity and body mass index (BMI) of 36.0 to 36.9 in adult [facility]) 10/08/2020   Recurrent strokes (HCC) 08/27/2020   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 [place], [place], [address] Phone: [phone]   Fax:  [phone]  Name: YON SCHIFFMAN MRN: [id number] Date of Birth: Jan 08, 1981

## 2020-11-08 ENCOUNTER — Other Ambulatory Visit: Payer: Self-pay | Admitting: Internal Medicine

## 2020-11-08 DIAGNOSIS — IMO0002 Reserved for concepts with insufficient information to code with codable children: Secondary | ICD-10-CM

## 2020-11-08 DIAGNOSIS — E1140 Type 2 diabetes mellitus with diabetic neuropathy, unspecified: Secondary | ICD-10-CM

## 2020-11-09 ENCOUNTER — Other Ambulatory Visit: Payer: Self-pay

## 2020-11-09 ENCOUNTER — Encounter: Payer: BC Managed Care – PPO | Admitting: Physical Therapy

## 2020-11-09 ENCOUNTER — Ambulatory Visit: Payer: BC Managed Care – PPO | Admitting: Physical Therapy

## 2020-11-09 ENCOUNTER — Encounter: Payer: Self-pay | Admitting: Physical Therapy

## 2020-11-09 DIAGNOSIS — M25511 Pain in right shoulder: Secondary | ICD-10-CM | POA: Diagnosis not present

## 2020-11-09 DIAGNOSIS — R296 Repeated falls: Secondary | ICD-10-CM

## 2020-11-09 DIAGNOSIS — M542 Cervicalgia: Secondary | ICD-10-CM | POA: Diagnosis not present

## 2020-11-09 DIAGNOSIS — M6281 Muscle weakness (generalized): Secondary | ICD-10-CM

## 2020-11-09 DIAGNOSIS — R2689 Other abnormalities of gait and mobility: Secondary | ICD-10-CM | POA: Diagnosis not present

## 2020-11-09 NOTE — Therapy (Signed)
[facility] [address] [place], [place], [address] Phone: [phone]   Fax:  [phone]  Physical Therapy Treatment  Patient Details  Name: Martin Mullen MRN: [id number] Date of Birth: April 28, 1980 Referring Provider (PT): [name], NP   Encounter Date: 11/09/2020   PT End of Session - 11/10/20 1441     Visit Number 6    Number of Visits 17    Date for PT Re-Evaluation 12/09/20    Authorization Type [employer]BS; VL 60 visits PT/OT/Chiro combined per calendar year    Progress Note Due on Visit 17    PT Start Time 1503    PT Stop Time 1545    PT Time Calculation (min) 42 min    Equipment Utilized During Treatment Gait belt    Activity Tolerance Patient tolerated treatment well    Behavior During Therapy WFL for tasks assessed/performed             Past Medical History:  Diagnosis Date   Acute transmural inferior wall MI (HCC) 08/28/2012   .5 x 12 mm Veri-flex stent non-DES.   Chicken pox    Coronary artery disease    Inferior ST elevation myocardial infarction in July of 2014. Cardiac catheterization showed 95% distal RCA stenosis and 90% first diagonal stenosis with normal ejection fraction. He underwent PCI in bare-metal stent placement to the distal RCA.   Diabetes mellitus without complication (HCC)    Hypercholesterolemia    Stroke (HCC)    Tobacco use     Past Surgical History:  Procedure Laterality Date   CORONARY ANGIOPLASTY WITH STENT PLACEMENT     DEBRIDEMENT AND CLOSURE WOUND N/A 09/03/2019   Procedure: Debridement and closure of scrotal wound;  Surgeon: Allena Napoleon, MD;  Location: [facility];  Service: Plastics;  Laterality: N/A;   DEBRIDEMENT AND CLOSURE WOUND N/A 07/25/2019   Procedure: DEBRIDEMENT AND CLOSURE WOUND;  Surgeon: Allena Napoleon, MD;  Location: [facility];  Service: Plastics;  Laterality: N/A;   INCISION AND DRAINAGE ABSCESS N/A 07/20/2019   Procedure: INCISION AND DRAINAGE  ABSCESS;  Surgeon: Crista Elliot, MD;  Location: [facility];  Service: Urology;  Laterality: N/A;   LEFT HEART CATHETERIZATION WITH CORONARY ANGIOGRAM N/A 08/28/2012   Procedure: LEFT HEART CATHETERIZATION WITH CORONARY ANGIOGRAM;  Surgeon: Pamella Pert, MD;  Location: [facility] CATH LAB;  Service: Cardiovascular;  Laterality: N/A;   TEE WITHOUT CARDIOVERSION N/A 09/23/2020   Procedure: TRANSESOPHAGEAL ECHOCARDIOGRAM (TEE);  Surgeon: Lamar Blinks, MD;  Location: [facility];  Service: Cardiovascular;  Laterality: N/A;   WRIST SURGERY  1988    There were no vitals filed for this visit.   Subjective Assessment - 11/09/20 1509     Subjective Patient denies additional fall since incident reported at last visit. Pt denies left wrist/hand pain. He states that left upper ribs along rib angle has gotten better (pain 6/10 reported last visit), but he does still feel it when rolling in bed and with direct pressure/palpation. Pt reports compliance with his HEP.    Pertinent History 40 year old male with primary complaint of recurrent falls; L-sided weakness and imbalance s/p CVA (admitted for CVA 08/26/20). Patient is a 40 year old male with involved medical history previously seen for neck/shoulder pain. He was referred to ED with concerning neurological signs toward end of his course of PT. Patient had basal ganglia lacunar type infarct. Patient currently reports having frequent falls. His PCP thinks this may be  due to L leg weakness. He reports having sudden falls with walking across his home and with going down front steps of his home. He states he often falls backward onto his seat when going to stand. He reports some difficulty with dysphagia. He denies issue with choking on his food. Patient reports no major change in sensation. 5 steps to exit home. Patient had 2 handrails to get up steps. Patient reports having incidents of LOB while in the shower with his eyes closed. He reports more urinary urgency. Pt  has 2 young kids that are often crawling around his household. [employer]. Pt reports some difficulty with car transfer- lifting his LLE.  No previous PT for this condition. Directional pattern for falls: Posterior usually.    Limitations Walking;House hold activities;Standing    Diagnostic tests Brain MRI - see below    Patient Stated Goals Decreased falls, improved balance    Pain Onset 1 to 4 weeks ago             OBJECTIVE FINDINGS  Observation Small region of ecchymosis 2 cm x 2 cm present along posterior 5th-6th intercostal space, no erythema or edema.  Palpation Tenderness to palpation along 5th-6th rib anterolaterally at rib angle and posterior portion of rib angle, no palpable defect      NuStep x 5 minutes, seat at 9, Level 4 - at beginning of session for warm-up to improve muscular performance/recruitment (unbilled time)     TREATMENT     Neuromuscular Re-education - for improved postural control, sensory integration re-training, dynamic postural stability    In // bars:  Standing Semitandem on Airex, eyes closed; 2x20sec each [tendency to fall posteriorly]   Hurdle stepping, 3 6-inch hurdles; x6 D/B   Lateral hurdle step, 3 6-inch hurdles; x6 D/B   Standing FT with perturbations, *on Airex*; 2 x 1 min bouts. Multi-directional random perturbations (including single and combined planes)      *next visit* Ball bounce off of wall; standing [employer] single-limb squat; Level 22; 2x10     *not today* Lateral hurdle step-over, with brief SL balance; 2x10, alternating, 6-inch hurdle Multidirectional stepping on blue star (lateral, anterolateral, anterior), bilateral LE; single step and then back to center; x10 each direction with each LE Closed-chain dorsiflexion/anterior ankle strategy with lean on wall; x20  L forward step up with R toe tap onto 2nd step; 6-inch step (using staircase in center of gym); 2x10     Therapeutic Activities - patient  education, HEP establishment; repetitive task practice for improved performance of daily functional activities e.g. transferring   In // bars:    Sit to stand; with Airex under feet; 2x8   Patient education: discussed with patient low index of suspicion for significant injury. Discussed continued monitoring of symptoms to to discuss with MD if symptoms aren't resolving following this week.      *not today*  Standing toe tap, 6-inch step; 2x10 alternating (standing at base of stairs) For HEP update: Standing heel raise/toe raise, with bilateral UE support on // bars; x10 Sidelying hip abduction; x10 [moderate cueing for positioning of LE and avoiding TFL compensation] 3-way toe tap on cone, 6-inch step; 2x10 alternating (standing at base of stairs)       ASSESSMENT Patient has improved pain along L rib cage, though some pain is still present and he has difficulty with L sidelying. He reports mild discomfort with full inhale. No significant ecchymosis (very small region of bruising that is not present  along region of pain), swelling, or redness. He does have pain remaining over 5th-6th ribs anterolaterally and over rib angle that is mild at this time. This is expected to be self-limiting; pt may f/u with MD as needed if symptoms are not resolving in expected timeline following contusion. Pt exhibits improving ability to respond to posterior postural perturbations and demonstrates improved stability with multi-directional stepping. He does have notable difficulty remaining with coordination and targeting of LLE. Patient has remaining deficits in sensory integration, postural control, gait instability, LLE proprioceptive/kinesthetic deficits, and LLE weakness associated with recurrent falls and increased fall risk. Patient will benefit from continued skilled therapeutic intervention to address the above deficits as needed for improved function and QoL.      PT Short Term Goals - 10/14/20 1844        PT SHORT TERM GOAL #1   Title Pt will be independent with HEP in order to improve strength and balance in order to decrease fall risk and improve function at home and work.    Baseline 10/14/20: HEP initiated    Time 3    Period Weeks    Status New    Target Date 10/28/20      PT SHORT TERM GOAL #2   Title Pt will perform 5TSTS in less than 12 seconds indicative of no increased risk of falls based on established cut-off score    Baseline 10/14/20: 5TSTS in 13 sec    Time 3    Period Weeks    Status New    Target Date 11/04/20               PT Long Term Goals - 10/26/20 1051       PT LONG TERM GOAL #1   Title Patient will demonstrate improved function as evidenced by a score of 55 on FOTO measure for full participation in activities at home and in the community.    Baseline 10/14/20: 43    Time 8    Period Weeks    Status New      PT LONG TERM GOAL #2   Title Pt will improve BERG by at least 3 points in order to demonstrate clinically significant improvement in balance.    Baseline 10/14/20: BERG to be performed next visit. 10/21/20: BERG 44    Time 8    Period Weeks    Status New      PT LONG TERM GOAL #3   Title Pt will improve ABC by at least 13% in order to demonstrate clinically significant improvement in balance confidence.    Baseline 10/14/20: ABC to be obtained next visit. 10/26/20: 68.1%    Time 8    Period Weeks    Status New    Target Date 12/09/20      PT LONG TERM GOAL #4   Title Patient will perform stair ascent and descent with no UE support and reciprocal pattern with no LOB and no major movement deviation (on 4 steps in clinic) simulating negotiating steps to enter/exit home    Baseline 10/14/20: Pt sometimes having falls with stair descent    Time 8    Period Weeks    Status New    Target Date 12/09/20                   Plan - 11/10/20 1450     Clinical Impression Statement Patient has improved pain along L rib cage, though some pain is  still present and he  has difficulty with L sidelying. He reports mild discomfort with full inhale. No significant ecchymosis (very small region of bruising that is not present along region of pain), swelling, or redness. He does have pain remaining over 5th-6th ribs anterolaterally and over rib angle that is mild at this time. This is expected to be self-limiting; pt may f/u with MD as needed if symptoms are not resolving in expected timeline following contusion. Pt exhibits improving ability to respond to posterior postural perturbations and demonstrates improved stability with multi-directional stepping. He does have notable difficulty remaining with coordination and targeting of LLE. Patient has remaining deficits in sensory integration, postural control, gait instability, LLE proprioceptive/kinesthetic deficits, and LLE weakness associated with recurrent falls and increased fall risk. Patient will benefit from continued skilled therapeutic intervention to address the above deficits as needed for improved function and QoL.    Personal Factors and Comorbidities Comorbidity 3+;Fitness;Profession    Comorbidities obesity, Type 2 DM, Hx of MI and cardiac stent, Hx of CVA, high cholesterol, smoking 1 pack/day    Examination-Activity Limitations Caring for Others;Stairs;Transfers;Locomotion Level;Bathing    Examination-Participation Restrictions Occupation;Interpersonal Relationship    Stability/Clinical Decision Making Evolving/Moderate complexity    Rehab Potential Good    PT Frequency 2x / week    PT Duration 8 weeks    PT Treatment/Interventions Therapeutic activities;Therapeutic exercise;Neuromuscular re-education;Manual techniques;Patient/family education;Stair training;Gait training;Functional mobility training;Balance training    PT Next Visit Plan Progress with balance training, obstacle negotiation, reactive postural control, LLE proprioceptive training, LE strengthening.    PT Home Exercise Plan  HEP Access Code [email or web address]    Consulted and Agree with Plan of Care Patient             Patient will benefit from skilled therapeutic intervention in order to improve the following deficits and impairments:  Decreased strength, Postural dysfunction, Decreased safety awareness, Decreased coordination, Abnormal gait, Decreased balance  Visit Diagnosis: Repeated falls  Muscle weakness (generalized)  Imbalance     Problem List Patient Active Problem List   Diagnosis Date Noted   Class 2 severe obesity due to excess calories with serious comorbidity and body mass index (BMI) of 36.0 to 36.9 in adult [facility]) 10/08/2020   Recurrent strokes (HCC) 08/27/2020   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 [place], [place], [address] Phone: [phone]   Fax:  [phone]  Name: Martin Mullen MRN: [id number] Date of Birth: [birth year]

## 2020-11-11 ENCOUNTER — Ambulatory Visit: Payer: BC Managed Care – PPO | Admitting: Physical Therapy

## 2020-11-11 ENCOUNTER — Encounter: Payer: BC Managed Care – PPO | Admitting: Physical Therapy

## 2020-11-11 ENCOUNTER — Encounter: Payer: Self-pay | Admitting: Physical Therapy

## 2020-11-11 ENCOUNTER — Other Ambulatory Visit: Payer: Self-pay

## 2020-11-11 DIAGNOSIS — M6281 Muscle weakness (generalized): Secondary | ICD-10-CM | POA: Diagnosis not present

## 2020-11-11 DIAGNOSIS — R2689 Other abnormalities of gait and mobility: Secondary | ICD-10-CM

## 2020-11-11 DIAGNOSIS — R296 Repeated falls: Secondary | ICD-10-CM | POA: Diagnosis not present

## 2020-11-11 DIAGNOSIS — M542 Cervicalgia: Secondary | ICD-10-CM | POA: Diagnosis not present

## 2020-11-11 DIAGNOSIS — M25511 Pain in right shoulder: Secondary | ICD-10-CM | POA: Diagnosis not present

## 2020-11-11 NOTE — Therapy (Signed)
[facility] [address] [place], [place], [address] Phone: [phone]   Fax:  [phone]  Physical Therapy Treatment  Patient Details  Name: Martin Mullen MRN: [id number] Date of Birth: March 07, 1980 Referring Provider (PT): [name], NP   Encounter Date: 11/11/2020   PT End of Session - 11/11/20 1336     Visit Number 7    Number of Visits 17    Date for PT Re-Evaluation 12/09/20    Authorization Type BCBS; VL 60 visits PT/OT/Chiro combined per calendar year    Progress Note Due on Visit 17    PT Start Time 1330    PT Stop Time 1413    PT Time Calculation (min) 43 min    Equipment Utilized During Treatment Gait belt    Activity Tolerance Patient tolerated treatment well    Behavior During Therapy [facility] for tasks assessed/performed             Past Medical History:  Diagnosis Date   Acute transmural inferior wall MI (HCC) 08/28/2012   .5 x 12 mm Veri-flex stent non-DES.   Chicken pox    Coronary artery disease    Inferior ST elevation myocardial infarction in July of 2014. Cardiac catheterization showed 95% distal RCA stenosis and 90% first diagonal stenosis with normal ejection fraction. He underwent PCI in bare-metal stent placement to the distal RCA.   Diabetes mellitus without complication (HCC)    Hypercholesterolemia    Stroke (HCC)    Tobacco use     Past Surgical History:  Procedure Laterality Date   CORONARY ANGIOPLASTY WITH STENT PLACEMENT     DEBRIDEMENT AND CLOSURE WOUND N/A 09/03/2019   Procedure: Debridement and closure of scrotal wound;  Surgeon: Allena Napoleon, MD;  Location: [facility];  Service: Plastics;  Laterality: N/A;   DEBRIDEMENT AND CLOSURE WOUND N/A 07/25/2019   Procedure: DEBRIDEMENT AND CLOSURE WOUND;  Surgeon: Allena Napoleon, MD;  Location: [facility];  Service: Plastics;  Laterality: N/A;   INCISION AND DRAINAGE ABSCESS N/A 07/20/2019   Procedure: INCISION AND DRAINAGE  ABSCESS;  Surgeon: Crista Elliot, MD;  Location: [facility];  Service: Urology;  Laterality: N/A;   LEFT HEART CATHETERIZATION WITH CORONARY ANGIOGRAM N/A 08/28/2012   Procedure: LEFT HEART CATHETERIZATION WITH CORONARY ANGIOGRAM;  Surgeon: Pamella Pert, MD;  Location: [facility] CATH LAB;  Service: Cardiovascular;  Laterality: N/A;   TEE WITHOUT CARDIOVERSION N/A 09/23/2020   Procedure: TRANSESOPHAGEAL ECHOCARDIOGRAM (TEE);  Surgeon: Lamar Blinks, MD;  Location: [facility];  Service: Cardiovascular;  Laterality: N/A;   WRIST SURGERY  1988    There were no vitals filed for this visit.   Subjective Assessment - 11/11/20 1334     Subjective Patient reports feeling generally well at arrival to PT. He reports improving L rib-cage pain. He denies any additional major incident or fall since his last appointment. Pt reports doing well with his HEP. Patient denies any new complaints at arrival today.    Pertinent History 40 year old male with primary complaint of recurrent falls; L-sided weakness and imbalance s/p CVA (admitted for CVA 08/26/20). Patient is a 40 year old male with involved medical history previously seen for neck/shoulder pain. He was referred to ED with concerning neurological signs toward end of his course of PT. Patient had basal ganglia lacunar type infarct. Patient currently reports having frequent falls. His PCP thinks this may be due to L leg weakness. He reports having sudden falls  with walking across his home and with going down front steps of his home. He states he often falls backward onto his seat when going to stand. He reports some difficulty with dysphagia. He denies issue with choking on his food. Patient reports no major change in sensation. 5 steps to exit home. Patient had 2 handrails to get up steps. Patient reports having incidents of LOB while in the shower with his eyes closed. He reports more urinary urgency. Pt has 2 young kids that are often crawling around his  household. [employer]. Pt reports some difficulty with car transfer- lifting his LLE.  No previous PT for this condition. Directional pattern for falls: Posterior usually.    Limitations Walking;House hold activities;Standing    Diagnostic tests Brain MRI - see below    Patient Stated Goals Decreased falls, improved balance    Pain Onset 1 to 4 weeks ago                NuStep x 5 minutes, seat at 9, Level 4 - at beginning of session for warm-up to improve muscular performance/recruitment (unbilled time)     TREATMENT     Neuromuscular Re-education - for improved postural control, sensory integration re-training, dynamic postural stability    In // bars:  Standing Semitandem on Airex, eyes closed; 2x30sec each Hurdle stepping, 3 6-inch hurdles; x6 D/B Lateral hurdle step, 3 6-inch hurdles; x6 D/B   Performed with treadmill handrail in front: Tandem standing; 2x30 sec on each side   Ball bounce off of wall; standing; x 1 min, with feet together  Rebounder with 2-lb medball; chest pass; 2x10, performed with feet together  Total Gym single-limb squat; Level 22; 2x10   Multidirectional stepping on blue star (8 directions), bilateral LE; single step and then back to center; x10 each direction with each LE     *not today* Standing FT with perturbations, *on Airex*; 2 x 1 min bouts. Multi-directional random perturbations (including single and combined planes) Lateral hurdle step-over, with brief SL balance; 2x10, alternating, 6-inch hurdle Closed-chain dorsiflexion/anterior ankle strategy with lean on wall; x20   L forward step up with R toe tap onto 2nd step; 6-inch step (using staircase in center of gym); 2x10     Therapeutic Activities - patient education, HEP establishment; repetitive task practice for improved performance of daily functional activities e.g. transferring   Sit to stand; with Airex under feet; 2x8   Standing toe tap, 12-inch step (two 6-inch  steps stacked); 2x10 alternating (standing at base of stairs), standing on Airex pad    *not today*  For HEP update: Standing heel raise/toe raise, with bilateral UE support on // bars; x10 Sidelying hip abduction; x10 [moderate cueing for positioning of LE and avoiding TFL compensation] 3-way toe tap on cone, 6-inch step; 2x10 alternating (standing at base of stairs)        ASSESSMENT Patient demonstrates improving ability to maintain postural stability with narrow BOS and while standing on compliant surface. He is able to progress multi-directional stepping drill to include posterior and posterolateral steps with bilateral LE. Pt takes smaller steps with LLE, but he does not have LOB during this stepping drill. Pt does have remaining limitations with LLE coordination as demonstrated by intermittent difficulty targeting L lower limb with hurdle stepping. Patient has remaining deficits in sensory integration, postural control, gait instability, LLE proprioceptive/kinesthetic deficits, and LLE weakness associated with recurrent falls and increased fall risk. Patient will benefit from continued skilled therapeutic intervention  to address the above deficits as needed for improved function and QoL.        PT Short Term Goals - 10/14/20 1844       PT SHORT TERM GOAL #1   Title Pt will be independent with HEP in order to improve strength and balance in order to decrease fall risk and improve function at home and work.    Baseline 10/14/20: HEP initiated    Time 3    Period Weeks    Status New    Target Date 10/28/20      PT SHORT TERM GOAL #2   Title Pt will perform 5TSTS in less than 12 seconds indicative of no increased risk of falls based on established cut-off score    Baseline 10/14/20: 5TSTS in 13 sec    Time 3    Period Weeks    Status New    Target Date 11/04/20               PT Long Term Goals - 10/26/20 1051       PT LONG TERM GOAL #1   Title Patient will demonstrate  improved function as evidenced by a score of 55 on FOTO measure for full participation in activities at home and in the community.    Baseline 10/14/20: 43    Time 8    Period Weeks    Status New      PT LONG TERM GOAL #2   Title Pt will improve BERG by at least 3 points in order to demonstrate clinically significant improvement in balance.    Baseline 10/14/20: BERG to be performed next visit. 10/21/20: BERG 44    Time 8    Period Weeks    Status New      PT LONG TERM GOAL #3   Title Pt will improve ABC by at least 13% in order to demonstrate clinically significant improvement in balance confidence.    Baseline 10/14/20: ABC to be obtained next visit. 10/26/20: 68.1%    Time 8    Period Weeks    Status New    Target Date 12/09/20      PT LONG TERM GOAL #4   Title Patient will perform stair ascent and descent with no UE support and reciprocal pattern with no LOB and no major movement deviation (on 4 steps in clinic) simulating negotiating steps to enter/exit home    Baseline 10/14/20: Pt sometimes having falls with stair descent    Time 8    Period Weeks    Status New    Target Date 12/09/20                   Plan - 11/12/20 1324     Clinical Impression Statement Patient demonstrates improving ability to maintain postural stability with narrow BOS and while standing on compliant surface. He is able to progress multi-directional stepping drill to include posterior and posterolateral steps with bilateral LE. Pt takes smaller steps with LLE, but he does not have LOB during this stepping drill. Pt does have remaining limitations with LLE coordination as demonstrated by intermittent difficulty targeting L lower limb with hurdle stepping. Patient has remaining deficits in sensory integration, postural control, gait instability, LLE proprioceptive/kinesthetic deficits, and LLE weakness associated with recurrent falls and increased fall risk. Patient will benefit from continued skilled  therapeutic intervention to address the above deficits as needed for improved function and QoL.    Personal Factors and Comorbidities Comorbidity 3+;Fitness;Profession  Comorbidities obesity, Type 2 DM, Hx of MI and cardiac stent, Hx of CVA, high cholesterol, smoking 1 pack/day    Examination-Activity Limitations Caring for Others;Stairs;Transfers;Locomotion Level;Bathing    Examination-Participation Restrictions Occupation;Interpersonal Relationship    Stability/Clinical Decision Making Evolving/Moderate complexity    Rehab Potential Good    PT Frequency 2x / week    PT Duration 8 weeks    PT Treatment/Interventions Therapeutic activities;Therapeutic exercise;Neuromuscular re-education;Manual techniques;Patient/family education;Stair training;Gait training;Functional mobility training;Balance training    PT Next Visit Plan Progress with balance training, obstacle negotiation, reactive postural control, LLE proprioceptive training, LE strengthening.    PT Home Exercise Plan HEP Access Code [email or web address]    Consulted and Agree with Plan of Care Patient             Patient will benefit from skilled therapeutic intervention in order to improve the following deficits and impairments:  Decreased strength, Postural dysfunction, Decreased safety awareness, Decreased coordination, Abnormal gait, Decreased balance  Visit Diagnosis: Repeated falls  Muscle weakness (generalized)  Imbalance     Problem List Patient Active Problem List   Diagnosis Date Noted   Class 2 severe obesity due to excess calories with serious comorbidity and body mass index (BMI) of 36.0 to 36.9 in adult [facility]) 10/08/2020   Recurrent strokes (HCC) 08/27/2020   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 [place], [place], [address] Phone: [phone]   Fax:  [phone]  Name: Martin Mullen MRN: [id number] Date of Birth: [birth year]

## 2020-11-16 ENCOUNTER — Ambulatory Visit: Payer: BC Managed Care – PPO | Attending: Internal Medicine | Admitting: Physical Therapy

## 2020-11-16 ENCOUNTER — Encounter: Payer: BC Managed Care – PPO | Admitting: Physical Therapy

## 2020-11-16 ENCOUNTER — Emergency Department: Payer: BC Managed Care – PPO

## 2020-11-16 ENCOUNTER — Encounter: Payer: Self-pay | Admitting: Physical Therapy

## 2020-11-16 ENCOUNTER — Other Ambulatory Visit: Payer: Self-pay

## 2020-11-16 ENCOUNTER — Encounter: Payer: Self-pay | Admitting: *Deleted

## 2020-11-16 ENCOUNTER — Ambulatory Visit
Admission: EM | Admit: 2020-11-16 | Discharge: 2020-11-16 | Disposition: A | Discharge status: Home / Self Care | Payer: BC Managed Care – PPO | Attending: Family Medicine | Admitting: Family Medicine

## 2020-11-16 ENCOUNTER — Emergency Department
Admission: EM | Admit: 2020-11-16 | Discharge: 2020-11-16 | Disposition: A | Discharge status: Home / Self Care | Payer: BC Managed Care – PPO | Attending: Emergency Medicine | Admitting: Emergency Medicine

## 2020-11-16 DIAGNOSIS — R0902 Hypoxemia: Secondary | ICD-10-CM | POA: Diagnosis not present

## 2020-11-16 DIAGNOSIS — F17210 Nicotine dependence, cigarettes, uncomplicated: Secondary | ICD-10-CM | POA: Insufficient documentation

## 2020-11-16 DIAGNOSIS — Z8673 Personal history of transient ischemic attack (TIA), and cerebral infarction without residual deficits: Secondary | ICD-10-CM | POA: Diagnosis not present

## 2020-11-16 DIAGNOSIS — Z7982 Long term (current) use of aspirin: Secondary | ICD-10-CM | POA: Diagnosis not present

## 2020-11-16 DIAGNOSIS — I2510 Atherosclerotic heart disease of native coronary artery without angina pectoris: Secondary | ICD-10-CM | POA: Diagnosis not present

## 2020-11-16 DIAGNOSIS — Z7901 Long term (current) use of anticoagulants: Secondary | ICD-10-CM | POA: Diagnosis not present

## 2020-11-16 DIAGNOSIS — M6281 Muscle weakness (generalized): Secondary | ICD-10-CM | POA: Insufficient documentation

## 2020-11-16 DIAGNOSIS — R4182 Altered mental status, unspecified: Secondary | ICD-10-CM | POA: Insufficient documentation

## 2020-11-16 DIAGNOSIS — R278 Other lack of coordination: Secondary | ICD-10-CM

## 2020-11-16 DIAGNOSIS — R531 Weakness: Secondary | ICD-10-CM | POA: Diagnosis not present

## 2020-11-16 DIAGNOSIS — Z794 Long term (current) use of insulin: Secondary | ICD-10-CM | POA: Insufficient documentation

## 2020-11-16 DIAGNOSIS — Z7984 Long term (current) use of oral hypoglycemic drugs: Secondary | ICD-10-CM | POA: Diagnosis not present

## 2020-11-16 DIAGNOSIS — I10 Essential (primary) hypertension: Secondary | ICD-10-CM | POA: Insufficient documentation

## 2020-11-16 DIAGNOSIS — Z20822 Contact with and (suspected) exposure to covid-19: Secondary | ICD-10-CM | POA: Insufficient documentation

## 2020-11-16 DIAGNOSIS — E119 Type 2 diabetes mellitus without complications: Secondary | ICD-10-CM | POA: Diagnosis not present

## 2020-11-16 DIAGNOSIS — R296 Repeated falls: Secondary | ICD-10-CM | POA: Insufficient documentation

## 2020-11-16 DIAGNOSIS — R2689 Other abnormalities of gait and mobility: Secondary | ICD-10-CM | POA: Insufficient documentation

## 2020-11-16 DIAGNOSIS — R739 Hyperglycemia, unspecified: Secondary | ICD-10-CM | POA: Diagnosis not present

## 2020-11-16 LAB — URINALYSIS, COMPLETE (UACMP) WITH MICROSCOPIC
Bacteria, UA: NONE SEEN
Bilirubin Urine: NEGATIVE
Glucose, UA: >=500 mg/dL — AB
Hgb urine dipstick: NEGATIVE
Ketones, ur: NEGATIVE mg/dL
Leukocytes,Ua: NEGATIVE
Nitrite: NEGATIVE
Protein, ur: 100 mg/dL — AB
Specific Gravity, Urine: 1.02 (ref 1.005–1.030)
pH: 5 (ref 5.0–8.0)

## 2020-11-16 LAB — CBC
HCT: 45.2 % (ref 39.0–52.0)
Hemoglobin: 16.3 g/dL (ref 13.0–17.0)
MCH: 32.9 pg (ref 26.0–34.0)
MCHC: 36.1 g/dL — ABNORMAL HIGH (ref 30.0–36.0)
MCV: 91.1 fL (ref 80.0–100.0)
Platelets: 213 10*3/uL (ref 150–400)
RBC: 4.96 MIL/uL (ref 4.22–5.81)
RDW: 12.8 % (ref 11.5–15.5)
WBC: 13.3 10*3/uL — ABNORMAL HIGH (ref 4.0–10.5)
nRBC: 0 % (ref 0.0–0.2)

## 2020-11-16 LAB — COMPREHENSIVE METABOLIC PANEL
ALT: 32 U/L (ref 0–44)
AST: 17 U/L (ref 15–41)
Albumin: 3.7 g/dL (ref 3.5–5.0)
Alkaline Phosphatase: 123 U/L (ref 38–126)
Anion gap: 8 (ref 5–15)
BUN: 19 mg/dL (ref 6–20)
CO2: 26 mmol/L (ref 22–32)
Calcium: 9.2 mg/dL (ref 8.9–10.3)
Chloride: 100 mmol/L (ref 98–111)
Creatinine, Ser: 0.96 mg/dL (ref 0.61–1.24)
GFR, Estimated: >60 mL/min (ref >60)
Glucose, Bld: 274 mg/dL — ABNORMAL HIGH (ref 70–99)
Potassium: 4.7 mmol/L (ref 3.5–5.1)
Sodium: 134 mmol/L — ABNORMAL LOW (ref 135–145)
Total Bilirubin: 0.8 mg/dL (ref 0.3–1.2)
Total Protein: 7.7 g/dL (ref 6.5–8.1)

## 2020-11-16 LAB — RESP PANEL BY RT-PCR (FLU A&B, COVID) ARPGX2
Influenza A by PCR: NEGATIVE
Influenza B by PCR: NEGATIVE
SARS Coronavirus 2 by RT PCR: NEGATIVE

## 2020-11-16 LAB — BLOOD GAS, VENOUS
Acid-Base Excess: 4.5 mmol/L — ABNORMAL HIGH (ref 0.0–2.0)
Bicarbonate: 30.4 mmol/L — ABNORMAL HIGH (ref 20.0–28.0)
O2 Saturation: 81.2 %
Patient temperature: 37
pCO2, Ven: 48 mmHg (ref 44.0–60.0)
pH, Ven: 7.41 (ref 7.250–7.430)
pO2, Ven: 45 mmHg (ref 32.0–45.0)

## 2020-11-16 MED ORDER — APIXABAN 5 MG PO TABS
5.0000 mg | ORAL_TABLET | Freq: Once | ORAL | Status: AC
  Administered 2020-11-16: 5 mg via ORAL
  Filled 2020-11-16: qty 1

## 2020-11-16 NOTE — ED Triage Notes (Signed)
Per patient's report, patient was at PT in [facility] today and was sent to [facility] for "seeming off." Patient has a history of CVA. Patient is oriented upon arrival, denies any complaints. Patient states he is on Metformin, but has not been taking it. Patient denies increased thirst or urination. Patient states he drove to PT today and lives at home. Patient has residual left-sided weakness.

## 2020-11-16 NOTE — Discharge Instructions (Addendum)
Your chest x-ray did not show any pneumonia and your urine sample was not consistent with infection.  Your CT scan of your brain did not show any acute changes.  Your blood sample was also reassuring.  You were given a dose of Eliquis tonight.  Please clarify tomorrow with your neurologist about whether you should be taking Eliquis aspirin and Plavix altogether.  It is very important that you know which of these are supposed to be taking.  Please also discuss your concerns about your neurology appointment tomorrow.

## 2020-11-16 NOTE — ED Triage Notes (Signed)
Pt presents from physical therapy. He has been seen there ongoing for PT from a previous stroke. The staff there reports today he is slower to speak, has some confusion, and weakness in his left leg. Pt drove himself to PT and they brought him to [name]. Pt is alert and oriented.

## 2020-11-16 NOTE — ED Provider Notes (Signed)
[facility]  ____________________________________________   Event Date/Time   First MD Initiated Contact with Patient 11/16/20 1725     (approximate)  I have reviewed the triage vital signs and the nursing notes.   HISTORY  Chief Complaint Altered Mental Status    HPI Martin Mullen is a 40 y.o. 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  Patient was at physical therapy today when there was concern that he was acting "off".  The patient himself does say that he felt like he was not doing the exercises go to his normal and he felt fatigued however he denies any confusion or new focal numbness or weakness.  At baseline patient has some instability with gait and left leg weakness from old strokes.  Patient denies any fevers, chills, nausea vomiting, abdominal pain, chest pain, dyspnea.  He is taking Eliquis but has not had a in about 5 days because his prescription ran out.  Patient's wife is at bedside and notes that for the past 3 days he has had more difficulty ambulating compared to normal and intermittent choking.         Past Medical History:  Diagnosis Date   Acute transmural inferior wall MI ([place]) 08/28/2012   .5 x 12 mm Veri-flex stent non-DES.   Chicken pox    Coronary artery disease    Inferior ST elevation myocardial infarction in July of 2014. Cardiac catheterization showed 95% distal RCA stenosis and 90% first diagonal stenosis with normal ejection fraction. He underwent PCI in bare-metal stent placement to the distal RCA.   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[place]) 03/25/2020   GERD (gastroesophageal reflux disease) 07/20/2019   Coronary artery disease    Type 2 diabetes mellitus with hyperlipidemia ([place]) 07/09/2013   STEMI s/p RCA stent 08/2012 09/05/2012   HTN (hypertension) 09/05/2012   HLD (hyperlipidemia) 09/05/2012   Adjustment disorder with mixed anxiety and depressed mood 09/05/2012    Past Surgical History:  Procedure Laterality Date   CORONARY ANGIOPLASTY WITH STENT PLACEMENT     DEBRIDEMENT AND CLOSURE WOUND N/A 09/03/2019   Procedure: Debridement and closure of scrotal wound;  Surgeon: Cindra Presume, MD;  Location: [place];  Service: Plastics;  Laterality: N/A;   DEBRIDEMENT AND CLOSURE WOUND N/A 07/25/2019   Procedure: DEBRIDEMENT AND CLOSURE WOUND;  Surgeon: Cindra Presume, MD;  Location: [facility];  Service: Plastics;  Laterality: N/A;   INCISION AND DRAINAGE ABSCESS N/A 07/20/2019   Procedure: INCISION AND DRAINAGE ABSCESS;  Surgeon: Lucas Mallow, MD;  Location: [facility];  Service: Urology;  Laterality: N/A;   LEFT HEART CATHETERIZATION WITH CORONARY ANGIOGRAM N/A 08/28/2012   Procedure: LEFT HEART CATHETERIZATION WITH CORONARY ANGIOGRAM;  Surgeon: Laverda Page, MD;  Location: [facility] C[facility];  Service: Cardiovascular;  Laterality: N/A;   TEE WITHOUT CARDIOVERSION N/A 09/23/2020   Procedure: TRANSESOPHAGEAL ECHOCARDIOGRAM (TEE);  Surgeon: Corey Skains, MD;  Location: [facility];  Service: Cardiovascular;  Laterality:  N/A;   WRIST SURGERY  1988    Prior to Admission medications   Medication Sig Start Date End Date Taking? Authorizing Provider  apixaban (ELIQUIS) 5 MG TABS tablet Take 1 tablet (5 mg total) by mouth 2 (two) times daily. 08/28/20   Hosie Poisson, MD  aspirin 81 MG EC tablet Take 1 tablet (81 mg total) by mouth daily. 09/05/12   Rai, Vernelle Emerald, MD  Blood Glucose Monitoring Suppl  (BAYER CONTOUR NEXT MONITOR) w/Device KIT 1 Device 07/22/15   [name], NP  CONTOUR NEXT TEST test strip USE 1 STRIP TO CHECK GLUCOSE THREE TIMES DAILY AS NEEDED 09/05/18   [name], NP  insulin glargine (LANTUS SOLOSTAR) 100 UNIT/ML Solostar Pen ADMINISTER 23 UNITS UNDER THE SKIN EVERY DAY 09/29/20   [name], NP  metFORMIN (GLUCOPHAGE) 1000 MG tablet TAKE 1 TABLET(1000 MG) BY MOUTH TWICE DAILY WITH A MEAL 11/09/20   [name], NP  Multiple Vitamin (MULTIVITAMIN WITH MINERALS) TABS tablet Take 1 tablet by mouth in the morning. One-A-Day Multivitamin    [provider]  nitroGLYCERIN (NITROSTAT) 0.4 MG SL tablet DISSOLVE ONE TABLET UNDER THE TONGUE EVERY 5 MINUTES AS NEEDED FOR CHEST PAIN.  DO NOT EXCEED A TOTAL OF 3 DOSES IN 15 MINUTES 11/13/19   [name], NP  Omega-3 Fatty Acids (FISH OIL) 1200 MG CAPS Take 1,200 mg by mouth in the morning and at bedtime.    [provider]  rosuvastatin (CRESTOR) 10 MG tablet Take 1 tablet (10 mg total) by mouth daily. 10/07/20   [name], NP    Allergies Patient has no known allergies.  Family History  Problem Relation Age of Onset   Arthritis Mother        Rheumatoid   Diabetes Mother    Hyperlipidemia Mother    Diabetes Father    Cancer Maternal Grandfather        Prostate   Parkinson's disease Maternal Grandfather     Social History Social History   Tobacco Use   Smoking status: Every Day    Packs/day: 1.00    Years: 30.00    Pack years: 30.00    Types: Cigarettes   Smokeless tobacco: Never  Vaping Use   Vaping Use: Never used  Substance Use Topics   Alcohol use: Not Currently    Alcohol/week: 0.0 standard drinks    Comment: occasional   Drug use: Not Currently    Types: Marijuana    Review of Systems   Review of Systems  Constitutional:  Positive for fatigue.  Respiratory:  Negative for shortness of breath.   Cardiovascular:  Negative for chest pain and leg swelling.   Gastrointestinal:  Negative for abdominal pain, nausea and vomiting.  Genitourinary:  Negative for dysuria.  Neurological:  Positive for weakness. Negative for light-headedness, numbness and headaches.  All other systems reviewed and are negative.  Physical Exam Updated Vital Signs BP (!) 133/95   Pulse 85   Temp 98.3 F (36.8 C) (Oral)   Resp 18   Ht _0  (1.778 m)   Wt 115.7 kg   SpO2 96%   BMI 36.59 kg/m   Physical Exam Vitals and nursing note reviewed.  Constitutional:      General: He is not in acute distress.    Appearance: Normal appearance.  HENT:     Head: Normocephalic and atraumatic.  Eyes:     General: No scleral icterus.    Conjunctiva/sclera: Conjunctivae  normal.  Pulmonary:     Effort: Pulmonary effort is normal. No respiratory distress.     Breath sounds: Normal breath sounds. No wheezing.  Abdominal:     General: Abdomen is flat. There is no distension.     Palpations: Abdomen is soft.     Tenderness: There is no abdominal tenderness. There is no guarding.  Musculoskeletal:        General: No deformity or signs of injury.     Cervical back: Normal range of motion.  Skin:    Coloration: Skin is not jaundiced or pale.  Neurological:     General: No focal deficit present.     Mental Status: He is alert and oriented to person, place, and time. Mental status is at baseline.     Comments: Patient has flat affect, he is oriented to person and place, thinks it is 2021 Aox3, nml speech  PERRL, EOMI, face symmetric, nml tongue movement  5/5 strength in the BL upper and lower extremities  Sensation grossly intact in the BL upper and lower extremities  Finger-nose-finger intact BL Patient ambulates, somewhat shuffling gait, no frank ataxia    Psychiatric:        Mood and Affect: Mood normal.        Behavior: Behavior normal.     LABS (all labs ordered are listed, but only abnormal results are displayed)  Labs Reviewed  CBC - Abnormal; Notable for  the following components:      Result Value   WBC 13.3 (*)    MCHC 36.1 (*)    All other components within normal limits  COMPREHENSIVE METABOLIC PANEL - Abnormal; Notable for the following components:   Sodium 134 (*)    Glucose, Bld 274 (*)    All other components within normal limits  BLOOD GAS, VENOUS - Abnormal; Notable for the following components:   Bicarbonate 30.4 (*)    Acid-Base Excess 4.5 (*)    All other components within normal limits  RESP PANEL BY RT-PCR (FLU A&B, COVID) ARPGX2  URINALYSIS, COMPLETE (UACMP) WITH MICROSCOPIC   ____________________________________________  EKG  N/a ____________________________________________  RADIOLOGY Almeta Monas, personally viewed and evaluated these images (plain radiographs) as part of my medical decision making, as well as reviewing the written report by the radiologist.  ED MD interpretation: I reviewed the CT of the head which shows old bilateral thalamic infarcts, no acute infarct    ____________________________________________   PROCEDURES  Procedure(s) performed (including Critical Care):  Procedures   ____________________________________________   INITIAL IMPRESSION / ASSESSMENT AND PLAN / ED COURSE     Patient is a 40 year old male with past medical history of multiple prior strokes including brainstem and cortical strokes, prior MI who presents with concern for confusion and altered mental status.  Patient really has no complaints at this time.  Does say he felt somewhat more fatigued during PT but denies any new numbness or weakness.  Patient's wife however notes that he has been stumbling more at home and choking which her symptoms of prior stroke for him.  On exam he is somewhat slow to respond but he is alert and follows commands.  His neurologic exam is nonfocal.  Does have somewhat of a shuffling gait.  I reviewed the note from physical therapy and from urgent care today.  Seems that they were  concerned that he was having worsening limb weakness and more difficulty with performing tasks and standing.  On my neurologic exam he really  does not have any acute new deficits and seems like most of this is chronic.  Labs were obtained which shows he has a mild leukocytosis.  Chest x-ray does not show any infiltrate.  Rest of his blood work is reassuring.  CT head shows his old thalamic infarct but no new acute findings.  I reviewed the patient's prior neurologic work-up.  It seems like the thought is that his prior CVAs have been related to his hypercoagulability.  He has no LVO and no PFO.  Patient is supposed be taking Eliquis 5 mg twice daily.  However he is also taking dual antiplatelet therapy with aspirin and Plavix.  Per the last hematology note it is unclear to me whether he is supposed to be taking all of these.  Patient's wife was a little bit confused as well.  They are however sure that he is supposed to be taking Eliquis.  He has been out of it since Wednesday so I gave him a dose tonight.  They see [facility] neurology tomorrow so I told him that this needs to be clarified because it is very important part of his care plan.  At this point do not feel that MRI is needed emergently in the ED given his reassuring exam and close follow-up that he already has tomorrow with neurology.  Patient and his wife are comfortable with this plan.  Patient will be discharged. Clinical Course as of 11/16/20 1803  Mon Nov 16, 2020  1714 AST: 17 [KM]  1714 Total Protein: 7.7 [KM]    Clinical Course User Index [KM] Rada Hay, MD     ____________________________________________   FINAL CLINICAL IMPRESSION(S) / ED DIAGNOSES  Final diagnoses:  None     ED Discharge Orders     None        Note:  This document was prepared using Dragon voice recognition software and may include unintentional dictation errors.    Rada Hay, MD 11/16/20 [phone]

## 2020-11-16 NOTE — ED Triage Notes (Signed)
Pt comes into the ED via EMS from [facility] urgent care, during PT today they thought he seemed off, no sx on arrival..   #20LAC 146/76 94HR [id number] 97%RA 98.7 temp

## 2020-11-16 NOTE — ED Provider Notes (Signed)
Emergency Medicine Provider Triage Evaluation Note  Martin Mullen, a 40 y.o. male  was evaluated in triage.  Pt complains of with a history of CVA and MI, on Plavix, presents to the ED from his physical therapy provider.  He presents via EMS, after the therapist called out for the patient "seeming off."  Patient is unaware of the reason for him presenting here.  He does not endorse any pain, discomfort, weakness, chest pain, shortness of breath, or disorientation.  He does admit that he is not taking his diabetes medication, but denies any increased urination, thirst, or irritability  Review of Systems  Positive: AMS Negative: CP, SOB  Physical Exam  There were no vitals taken for this visit. Gen:   Awake, no distress NAD Resp:  Normal effort CTA MSK:   Moves extremities without difficulty left hemiparesis Other:  CVS: RRR   Medical Decision Making  Medically screening exam initiated at 3:59 PM.  Appropriate orders placed.  Martin Mullen was informed that the remainder of the evaluation will be completed by another provider, this initial triage assessment does not replace that evaluation, and the importance of remaining in the ED until their evaluation is complete.  Patient ED evaluation of reports of AMS from his physical therapy provider, presenting from PT clinic for evaluation.   Lissa Hoard, PA-C 11/16/20 1605    Gilles Chiquito, MD 11/16/20 [phone]

## 2020-11-16 NOTE — ED Notes (Signed)
EMS called by this RN for transportation to the ER.

## 2020-11-16 NOTE — ED Provider Notes (Signed)
[facility]    CSN: [id number] Arrival date & time: 11/16/20  1411      History   Chief Complaint Chief Complaint  Patient presents with   Weakness    HPI 40 year old male presents for the above complaint.  Patient has extensive past medical history including recurrent CVA of unclear etiology.  He has been seen by cardiology as well as neurology.  He is currently on Eliquis.  He was at physical therapy today and the physical therapist noted that he was not his normal self.  I spoke with physical therapy in person.  Physical therapist states that he seemed to have more difficulty with his balance and had some weakness of the right hip flexor.  Also noted to have difficulty with heel-to-shin testing as well as nystagmus.  PT subsequently brought him over to our urgent care for evaluation.  Patient states that he feels like he is at his baseline but that he was sent over for possible stroke.  Patient does note some difficulties with his balance.  He denies worsening weakness.  Denies issues with his speech.  Denies vision changes.  Past Medical History:  Diagnosis Date   Acute transmural inferior wall MI ([place]) 08/28/2012   .5 x 12 mm Veri-flex stent non-DES.   Chicken pox    Coronary artery disease    Inferior ST elevation myocardial infarction in July of 2014. Cardiac catheterization showed 95% distal RCA stenosis and 90% first diagonal stenosis with normal ejection fraction. He underwent PCI in bare-metal stent placement to the distal RCA.   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[place]) 03/25/2020   GERD (gastroesophageal reflux  disease) 07/20/2019   Coronary artery disease    Type 2 diabetes mellitus with hyperlipidemia ([place]) 07/09/2013   STEMI s/p RCA stent 08/2012 09/05/2012   HTN (hypertension) 09/05/2012   HLD (hyperlipidemia) 09/05/2012   Adjustment disorder with mixed anxiety and depressed mood 09/05/2012    Past Surgical History:  Procedure Laterality Date   CORONARY ANGIOPLASTY WITH STENT PLACEMENT     DEBRIDEMENT AND CLOSURE WOUND N/A 09/03/2019   Procedure: Debridement and closure of scrotal wound;  Surgeon: Cindra Presume, MD;  Location: [place];  Service: Plastics;  Laterality: N/A;   DEBRIDEMENT AND CLOSURE WOUND N/A 07/25/2019   Procedure: DEBRIDEMENT AND CLOSURE WOUND;  Surgeon: Cindra Presume, MD;  Location: [facility];  Service: Plastics;  Laterality: N/A;   INCISION AND DRAINAGE ABSCESS N/A 07/20/2019   Procedure: INCISION AND DRAINAGE ABSCESS;  Surgeon: Lucas Mallow, MD;  Location: [facility];  Service: Urology;  Laterality: N/A;   LEFT HEART CATHETERIZATION WITH CORONARY ANGIOGRAM N/A 08/28/2012   Procedure: LEFT HEART CATHETERIZATION WITH CORONARY ANGIOGRAM;  Surgeon: Laverda Page, MD;  Location: [facility] CATH [facility];  Service: Cardiovascular;  Laterality: N/A;   TEE WITHOUT CARDIOVERSION N/A 09/23/2020   Procedure: TRANSESOPHAGEAL ECHOCARDIOGRAM (TEE);  Surgeon: Corey Skains, MD;  Location: [facility];  Service: Cardiovascular;  Laterality: N/A;   [place] Medications    Prior to Admission medications  Medication Sig Start Date End Date Taking? Authorizing Provider  apixaban (ELIQUIS) 5 MG TABS tablet Take 1 tablet (5 mg total) by mouth 2 (two) times daily. 08/28/20   Hosie Poisson, MD  aspirin 81 MG EC tablet Take 1 tablet (81 mg total) by mouth daily. 09/05/12   Rai, Vernelle Emerald, MD  Blood Glucose Monitoring Suppl (BAYER CONTOUR NEXT MONITOR) w/Device KIT 1 Device 07/22/15   [name], NP  CONTOUR NEXT TEST test strip USE 1 STRIP TO CHECK  GLUCOSE THREE TIMES DAILY AS NEEDED 09/05/18   [name], NP  insulin glargine (LANTUS SOLOSTAR) 100 UNIT/ML Solostar Pen ADMINISTER 23 UNITS UNDER THE SKIN EVERY DAY 09/29/20   [name], NP  metFORMIN (GLUCOPHAGE) 1000 MG tablet TAKE 1 TABLET(1000 MG) BY MOUTH TWICE DAILY WITH A MEAL 11/09/20   [name], NP  Multiple Vitamin (MULTIVITAMIN WITH MINERALS) TABS tablet Take 1 tablet by mouth in the morning. One-A-Day Multivitamin    [provider]  nitroGLYCERIN (NITROSTAT) 0.4 MG SL tablet DISSOLVE ONE TABLET UNDER THE TONGUE EVERY 5 MINUTES AS NEEDED FOR CHEST PAIN.  DO NOT EXCEED A TOTAL OF 3 DOSES IN 15 MINUTES 11/13/19   [name], NP  Omega-3 Fatty Acids (FISH OIL) 1200 MG CAPS Take 1,200 mg by mouth in the morning and at bedtime.    [provider]  rosuvastatin (CRESTOR) 10 MG tablet Take 1 tablet (10 mg total) by mouth daily. 10/07/20   [name], NP    Family History Family History  Problem Relation Age of Onset   Arthritis Mother        Rheumatoid   Diabetes Mother    Hyperlipidemia Mother    Diabetes Father    Cancer Maternal Grandfather        Prostate   Parkinson's disease Maternal Grandfather     Social History Social History   Tobacco Use   Smoking status: Every Day    Packs/day: 1.00    Years: 30.00    Pack years: 30.00    Types: Cigarettes   Smokeless tobacco: Never  Vaping Use   Vaping Use: Never used  Substance Use Topics   Alcohol use: Not Currently    Alcohol/week: 0.0 standard drinks    Comment: occasional   Drug use: Yes    Types: Marijuana    Comment: 4 days ago     Allergies   Patient has no known allergies.   Review of Systems Review of Systems Per HPI  Physical Exam Triage Vital Signs ED Triage Vitals  Enc Vitals Group     BP 11/16/20 1417 (!) 137/95     Pulse Rate 11/16/20 1417 90     Resp 11/16/20 1417 19     Temp 11/16/20 1417 98 F (36.7 C)     Temp src --      SpO2 11/16/20 1417  99 %     Weight --      Height --      Head Circumference --      Peak Flow --      Pain Score 11/16/20 1415 0     Pain Loc --      Pain Edu? --      Excl. in [place]? --    Updated Vital Signs BP (!) 137/95   Pulse 90   Temp 98 F (36.7 C)   Resp 19   SpO2 99%   Visual Acuity Right Eye Distance:  Left Eye Distance:   Bilateral Distance:    Right Eye Near:   Left Eye Near:    Bilateral Near:     Physical Exam Vitals and nursing note reviewed.  Constitutional:      Appearance: He is obese.     Comments: Patient is alert.  He is in no acute distress.  HENT:     Head: Normocephalic and atraumatic.  Eyes:     Conjunctiva/sclera: Conjunctivae normal.     Pupils: Pupils are equal, round, and reactive to light.  Cardiovascular:     Rate and Rhythm: Normal rate and regular rhythm.  Pulmonary:     Effort: Pulmonary effort is normal.     Breath sounds: Normal breath sounds.  Neurological:     Mental Status: He is alert.     Comments: Patient is alert.  He is not fully oriented as he informed me that the year was 2023. Nystagmus noted with lateral gaze. No significant muscle weakness noted on exam. Patient does have issues with balance and coordination as he was unable to do heel-to-shin testing without holding onto the wall.  Psychiatric:     Comments: Flat affect.     UC Treatments / Results  Labs (all labs ordered are listed, but only abnormal results are displayed) Labs Reviewed - No data to display  EKG   Radiology No results found.  Procedures Procedures (including critical care time)  Medications Ordered in UC Medications - No data to display  Initial Impression / Assessment and Plan / UC Course  I have reviewed the triage vital signs and the nursing notes.  Pertinent labs & imaging results that were available during my care of the patient were reviewed by me and considered in my medical decision making (see chart for details).    40 year old male  with recurrent stroke presents with concern for stroke.  Nystagmus noted on exam.  Patient having difficulty with balance and coordination.  No significant muscle weakness on my exam, however physical therapy noted weakness of the hip flexor on the right.  Given patient's history, patient needs additional work-up and evaluation.  He is going to the hospital via ambulance.  Final Clinical Impressions(s) / UC Diagnoses   Final diagnoses:  Weakness  Coordination abnormal  History of multiple strokes   Discharge Instructions   None    ED Prescriptions   None    PDMP not reviewed this encounter.   Coral Spikes, [place] 11/16/20 1441

## 2020-11-16 NOTE — ED Notes (Signed)
Labs were drawn. [place] and VBG sent to lab. RT informed.

## 2020-11-16 NOTE — Therapy (Signed)
[facility] [address] [place], [place], [address] Phone: [phone]   Fax:  [phone]  Physical Therapy Treatment  Patient Details  Name: Martin Mullen MRN: [id number] Date of Birth: [birth year] Referring Provider (PT): [name], NP   Encounter Date: 11/16/2020   PT End of Session - 11/16/20 1517     Visit Number 8    Number of Visits 17    Date for PT Re-Evaluation 12/09/20    Authorization Type [employer]; VL 60 visits PT/OT/Chiro combined per calendar year    Progress Note Due on Visit 17    PT Start Time 1344    PT Stop Time 1406    PT Time Calculation (min) 22 min    Equipment Utilized During Treatment Gait belt    Activity Tolerance Patient tolerated treatment well    Behavior During Therapy WFL for tasks assessed/performed             Past Medical History:  Diagnosis Date   Acute transmural inferior wall MI (HCC) 08/28/2012   .5 x 12 mm Veri-flex stent non-DES.   Chicken pox    Coronary artery disease    Inferior ST elevation myocardial infarction in July of 2014. Cardiac catheterization showed 95% distal RCA stenosis and 90% first diagonal stenosis with normal ejection fraction. He underwent PCI in bare-metal stent placement to the distal RCA.   Diabetes mellitus without complication (HCC)    Hypercholesterolemia    Stroke (HCC)    Tobacco use     Past Surgical History:  Procedure Laterality Date   CORONARY ANGIOPLASTY WITH STENT PLACEMENT     DEBRIDEMENT AND CLOSURE WOUND N/A 09/03/2019   Procedure: Debridement and closure of scrotal wound;  Surgeon: Allena Napoleon, MD;  Location: [facility];  Service: Plastics;  Laterality: N/A;   DEBRIDEMENT AND CLOSURE WOUND N/A 07/25/2019   Procedure: DEBRIDEMENT AND CLOSURE WOUND;  Surgeon: Allena Napoleon, MD;  Location: [facility];  Service: Plastics;  Laterality: N/A;   INCISION AND DRAINAGE ABSCESS N/A 07/20/2019   Procedure: INCISION AND DRAINAGE  ABSCESS;  Surgeon: Crista Elliot, MD;  Location: [facility];  Service: Urology;  Laterality: N/A;   LEFT HEART CATHETERIZATION WITH CORONARY ANGIOGRAM N/A 08/28/2012   Procedure: LEFT HEART CATHETERIZATION WITH CORONARY ANGIOGRAM;  Surgeon: Pamella Pert, MD;  Location: [facility] CATH LAB;  Service: Cardiovascular;  Laterality: N/A;   TEE WITHOUT CARDIOVERSION N/A 09/23/2020   Procedure: TRANSESOPHAGEAL ECHOCARDIOGRAM (TEE);  Surgeon: Lamar Blinks, MD;  Location: [facility];  Service: Cardiovascular;  Laterality: N/A;   WRIST SURGERY  1988    There were no vitals filed for this visit.   Subjective Assessment - 11/16/20 1347     Subjective Patient arrives to clinic about 12 minutes after appointment time. Patient reports no recent falls or near-fall incidents. He reports compliance with his home exercise program. Patient reports tolerating last visit well. Patient reports that he may feel more weak at this time. Upon further questioning into session, pt reports that his fiance is concerned he may have had an another stroke. Patient denies new numbness or sensory loss/sensory change and no new onset of dizziness or vertigo. Patient denies recent visual changes. He gives tenuous feedback in regard to changes in his ability to perform functional activities at home.    Pertinent History 40 year old male with primary complaint of recurrent falls; L-sided weakness and imbalance s/p CVA (admitted for CVA 08/26/20). Patient  is a 40 year old male with involved medical history previously seen for neck/shoulder pain. He was referred to ED with concerning neurological signs toward end of his course of PT. Patient had basal ganglia lacunar type infarct. Patient currently reports having frequent falls. His PCP thinks this may be due to L leg weakness. He reports having sudden falls with walking across his home and with going down front steps of his home. He states he often falls backward onto his seat when going to  stand. He reports some difficulty with dysphagia. He denies issue with choking on his food. Patient reports no major change in sensation. 5 steps to exit home. Patient had 2 handrails to get up steps. Patient reports having incidents of LOB while in the shower with his eyes closed. He reports more urinary urgency. Pt has 2 young kids that are often crawling around his household. [employer]. Pt reports some difficulty with car transfer- lifting his LLE.  No previous PT for this condition. Directional pattern for falls: Posterior usually.    Limitations Walking;House hold activities;Standing    Diagnostic tests Brain MRI - see below    Patient Stated Goals Decreased falls, improved balance    Pain Onset 1 to 4 weeks ago              Neuro Screen Patient presents with flat affect, slowed speech Patient has no gross facial asymmetry at baseline; symmetrical smile exhibited Impaired targeting with smooth pursuits, nystagmus with L-sided horizontal tracking in mid-ROM Convergence WNL Pronator drift: R Negative, L Positive (mild)  Dysdiadochokinesia: UE: R Fair, L Impaired; LE: R Impaired, L Impaired   Coordination Screen: Heel to shin impaired bilaterally [difficulty accessing hip flexion AROM with LLE]       NuStep x 3 minutes, seat at 9, Level 4 - at beginning of session for warm-up to improve muscular performance/recruitment (unbilled time)     TREATMENT     Therapeutic Activities - patient education, HEP establishment; repetitive task practice for improved performance of daily functional activities e.g. transferring   Sit to stand; with Airex under feet; 2x8   Standing toe tap, 12-inch step (two 6-inch steps stacked); 2x10 alternating (standing at base of stairs), standing on Airex pad [intermittent LOB and difficulty targeting step]   Sit to stand, x5 [intermittent posterior loss of balance after attaining standing position]  Multidirectional standing on blue star;  6-way (anterior, anterolateral, lateral, posterolateral, posterior); x5 ea dir, bilat LE  Brief neuro screen (see above) and patient education on current findings, recommendations for immediate medical attention, further POC pending screening for concerning neurological signs and status change.     *not today*  For HEP update: Standing heel raise/toe raise, with bilateral UE support on // bars; x10 Sidelying hip abduction; x10 [moderate cueing for positioning of LE and avoiding TFL compensation] 3-way toe tap on cone, 6-inch step; 2x10 alternating (standing at base of stairs)         ASSESSMENT Session was quickly initiated today due to late arrival. Further subjective exam and neuro screen was necessitated after patient exhibited notable changes in clinical presentation and functional activities in clinic. Patient has history of recurrent strokes and has progressed well following his recent CVA. However, patient presents today with worsening lower limb weakness as exhibited by difficulty with open-chain hip flexion in sitting and standing position, increasing difficulty with stepping and weight shifting tasks, worsening instability following performance of sit to stand, occulomotor and coordination deficits, mild pronator drift, and significantly  changed affect and slowed speech compared to previous visit. Patient also attests to feeling confusion and increasing weakness. Immediate medical attention was recommended following today's session given clinical findings with quick screen and history of multiple CVA's. PT walked with patient to next-door urgent care facility. Patient followed up with medical staff regarding concerning change in functional status and neurological findings. Continued PT pending medical plan of care following visit with physician today.       PT Short Term Goals - 10/14/20 1844       PT SHORT TERM GOAL #1   Title Pt will be independent with HEP in order to improve  strength and balance in order to decrease fall risk and improve function at home and work.    Baseline 10/14/20: HEP initiated    Time 3    Period Weeks    Status New    Target Date 10/28/20      PT SHORT TERM GOAL #2   Title Pt will perform 5TSTS in less than 12 seconds indicative of no increased risk of falls based on established cut-off score    Baseline 10/14/20: 5TSTS in 13 sec    Time 3    Period Weeks    Status New    Target Date 11/04/20               PT Long Term Goals - 10/26/20 1051       PT LONG TERM GOAL #1   Title Patient will demonstrate improved function as evidenced by a score of 55 on FOTO measure for full participation in activities at home and in the community.    Baseline 10/14/20: 43    Time 8    Period Weeks    Status New      PT LONG TERM GOAL #2   Title Pt will improve BERG by at least 3 points in order to demonstrate clinically significant improvement in balance.    Baseline 10/14/20: BERG to be performed next visit. 10/21/20: BERG 44    Time 8    Period Weeks    Status New      PT LONG TERM GOAL #3   Title Pt will improve ABC by at least 13% in order to demonstrate clinically significant improvement in balance confidence.    Baseline 10/14/20: ABC to be obtained next visit. 10/26/20: 68.1%    Time 8    Period Weeks    Status New    Target Date 12/09/20      PT LONG TERM GOAL #4   Title Patient will perform stair ascent and descent with no UE support and reciprocal pattern with no LOB and no major movement deviation (on 4 steps in clinic) simulating negotiating steps to enter/exit home    Baseline 10/14/20: Pt sometimes having falls with stair descent    Time 8    Period Weeks    Status New    Target Date 12/09/20                   Plan - 11/16/20 1534     Clinical Impression Statement Session was quickly initiated today due to late arrival. Further subjective exam and neuro screen was necessitated after patient exhibited notable  changes in clinical presentation and functional activities in clinic. Patient has history of recurrent strokes and has progressed well following his recent CVA. However, patient presents today with worsening lower limb weakness as exhibited by difficulty with open-chain hip flexion in sitting and standing  position, increasing difficulty with stepping and weight shifting tasks, worsening instability following performance of sit to stand, occulomotor and coordination deficits, mild pronator drift, and significantly changed affect and slowed speech compared to previous visit. Patient also attests to feeling confusion and increasing weakness. Immediate medical attention was recommended following today's session given clinical findings with quick screen and history of multiple CVA's. PT walked with patient to next-door urgent care facility. Patient followed up with medical staff regarding concerning change in functional status and neurological findings. Continued PT pending medical plan of care following visit with physician today.    Personal Factors and Comorbidities Comorbidity 3+;Fitness;Profession    Comorbidities obesity, Type 2 DM, Hx of MI and cardiac stent, Hx of CVA, high cholesterol, smoking 1 pack/day    Examination-Activity Limitations Caring for Others;Stairs;Transfers;Locomotion Level;Bathing    Examination-Participation Restrictions Occupation;Interpersonal Relationship    Stability/Clinical Decision Making Evolving/Moderate complexity    Rehab Potential Good    PT Frequency 2x / week    PT Duration 8 weeks    PT Treatment/Interventions Therapeutic activities;Therapeutic exercise;Neuromuscular re-education;Manual techniques;Patient/family education;Stair training;Gait training;Functional mobility training;Balance training    PT Next Visit Plan Progress with balance training, obstacle negotiation, reactive postural control, LLE proprioceptive training, LE strengthening.    PT Home Exercise Plan  HEP Access Code [email or web address]    Consulted and Agree with Plan of Care Patient             Patient will benefit from skilled therapeutic intervention in order to improve the following deficits and impairments:  Decreased strength, Postural dysfunction, Decreased safety awareness, Decreased coordination, Abnormal gait, Decreased balance  Visit Diagnosis: Repeated falls  Muscle weakness (generalized)  Imbalance     Problem List Patient Active Problem List   Diagnosis Date Noted   Class 2 severe obesity due to excess calories with serious comorbidity and body mass index (BMI) of 36.0 to 36.9 in adult [facility]) 10/08/2020   Recurrent strokes (HCC) 08/27/2020   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 [place], [place], [address] Phone: [phone]   Fax:  [phone]  Name: Martin Mullen MRN: [id number] Date of Birth: 1980/05/15

## 2020-11-16 NOTE — ED Notes (Signed)
Patient is being discharged from the Urgent Care and sent to the Emergency Department via EMS . Per Dr. Adriana Simas, patient is in need of higher level of care due to rule out stroke. Patient is aware and verbalizes understanding of plan of care.  Vitals:   11/16/20 1417  BP: (!) 137/95  Pulse: 90  Resp: 19  Temp: 98 F (36.7 C)  SpO2: 99%

## 2020-11-17 ENCOUNTER — Encounter: Payer: Self-pay | Admitting: Internal Medicine

## 2020-11-17 DIAGNOSIS — I6930 Unspecified sequelae of cerebral infarction: Secondary | ICD-10-CM | POA: Diagnosis not present

## 2020-11-17 MED ORDER — APIXABAN 5 MG PO TABS: 5.0000 mg | ORAL_TABLET | Freq: Two times a day (BID) | ORAL | 0 refills | Status: DC

## 2020-11-18 ENCOUNTER — Encounter: Payer: BC Managed Care – PPO | Admitting: Physical Therapy

## 2020-11-23 ENCOUNTER — Ambulatory Visit: Payer: BC Managed Care – PPO | Admitting: Physical Therapy

## 2020-11-23 ENCOUNTER — Other Ambulatory Visit: Payer: Self-pay

## 2020-11-23 ENCOUNTER — Encounter: Payer: BC Managed Care – PPO | Admitting: Physical Therapy

## 2020-11-23 ENCOUNTER — Encounter: Payer: Self-pay | Admitting: Physical Therapy

## 2020-11-23 DIAGNOSIS — M6281 Muscle weakness (generalized): Secondary | ICD-10-CM | POA: Diagnosis not present

## 2020-11-23 DIAGNOSIS — R296 Repeated falls: Secondary | ICD-10-CM | POA: Diagnosis not present

## 2020-11-23 DIAGNOSIS — R2689 Other abnormalities of gait and mobility: Secondary | ICD-10-CM

## 2020-11-23 NOTE — Therapy (Signed)
[facility] [address] [place], [place], [address] Phone: [phone]   Fax:  [phone]  Physical Therapy Treatment/ Physical Therapy Progress Note   Dates of reporting period  10/14/20   to   11/23/20   Patient Details  Name: Martin Mullen MRN: [id number] Date of Birth: Jul 08, 1980 Referring Provider (PT): [name], NP   Encounter Date: 11/23/2020   PT End of Session - 11/23/20 1333     Visit Number 9    Number of Visits 17    Date for PT Re-Evaluation 12/09/20    Authorization Type BCBS; VL 60 visits PT/OT/Chiro combined per calendar year    Authorization Time Period Cert 9/38/10-17/51/02    Progress Note Due on Visit 17    PT Start Time 1330    PT Stop Time 1418    PT Time Calculation (min) 48 min    Equipment Utilized During Treatment Gait belt    Activity Tolerance Patient tolerated treatment well    Behavior During Therapy WFL for tasks assessed/performed             Past Medical History:  Diagnosis Date   Acute transmural inferior wall MI ([place]) 08/28/2012   .5 x 12 mm Veri-flex stent non-DES.   Chicken pox    Coronary artery disease    Inferior ST elevation myocardial infarction in July of 2014. Cardiac catheterization showed 95% distal RCA stenosis and 90% first diagonal stenosis with normal ejection fraction. He underwent PCI in bare-metal stent placement to the distal RCA.   Diabetes mellitus without complication ([place])    Hypercholesterolemia    Stroke ([place])    Tobacco use     Past Surgical History:  Procedure Laterality Date   CORONARY ANGIOPLASTY WITH STENT PLACEMENT     DEBRIDEMENT AND CLOSURE WOUND N/A 09/03/2019   Procedure: Debridement and closure of scrotal wound;  Surgeon: Cindra Presume, MD;  Location: [facility];  Service: Plastics;  Laterality: N/A;   DEBRIDEMENT AND CLOSURE WOUND N/A 07/25/2019   Procedure: DEBRIDEMENT AND CLOSURE WOUND;  Surgeon: Cindra Presume,  MD;  Location: [facility];  Service: Plastics;  Laterality: N/A;   INCISION AND DRAINAGE ABSCESS N/A 07/20/2019   Procedure: INCISION AND DRAINAGE ABSCESS;  Surgeon: Lucas Mallow, MD;  Location: [facility];  Service: Urology;  Laterality: N/A;   LEFT HEART CATHETERIZATION WITH CORONARY ANGIOGRAM N/A 08/28/2012   Procedure: LEFT HEART CATHETERIZATION WITH CORONARY ANGIOGRAM;  Surgeon: Laverda Page, MD;  Location: [facility] CATH LAB;  Service: Cardiovascular;  Laterality: N/A;   TEE WITHOUT CARDIOVERSION N/A 09/23/2020   Procedure: TRANSESOPHAGEAL ECHOCARDIOGRAM (TEE);  Surgeon: Corey Skains, MD;  Location: [facility];  Service: Cardiovascular;  Laterality: N/A;   WRIST SURGERY  1988    There were no vitals filed for this visit.   Subjective Assessment - 11/23/20 1333     Subjective Patient reports no new complaints at arrival to PT. Pt was cleared at ED following last visit - per CT, no new infarct (only old thalamic infarct present) with brain MRI not indicated at the time of examination at the ED. Patent recently followed up with neurology and pt was informed that he would be placed on Holter monitor per pt secondary report. Patient reports falling every day while walking outside of his home since this past Wednesday. Pt reports 65-70% SANE score at this time. Patient reports he mainly needs to stop falling backward. Pt reports  doing well with negotiating threshold to get into his home recently.    Pertinent History 40 year old male with primary complaint of recurrent falls; L-sided weakness and imbalance s/p CVA (admitted for CVA 08/26/20). Patient is a 40 year old male with involved medical history previously seen for neck/shoulder pain. He was referred to ED with concerning neurological signs toward end of his course of PT. Patient had basal ganglia lacunar type infarct. Patient currently reports having frequent falls. His PCP thinks this may be due to L leg weakness. He reports having sudden falls  with walking across his home and with going down front steps of his home. He states he often falls backward onto his seat when going to stand. He reports some difficulty with dysphagia. He denies issue with choking on his food. Patient reports no major change in sensation. 5 steps to exit home. Patient had 2 handrails to get up steps. Patient reports having incidents of LOB while in the shower with his eyes closed. He reports more urinary urgency. Pt has 2 young kids that are often crawling around his household. [employer]. Pt reports some difficulty with car transfer- lifting his LLE.  No previous PT for this condition. Directional pattern for falls: Posterior usually.    Limitations Walking;House hold activities;Standing    Diagnostic tests Brain MRI - see below    Patient Stated Goals Decreased falls, improved balance    Pain Onset 1 to 4 weeks ago                [facility] PT Assessment - 11/23/20 1349       Berg Balance Test   Sit to Stand Able to stand without using hands and stabilize independently    Standing Unsupported Able to stand safely 2 minutes    Sitting with Back Unsupported but Feet Supported on Floor or Stool Able to sit safely and securely 2 minutes    Stand to Sit Sits safely with minimal use of hands    Transfers Able to transfer safely, minor use of hands    Standing Unsupported with Eyes Closed Able to stand 10 seconds with supervision    Standing Unsupported with Feet Together Able to place feet together independently and stand for 1 minute with supervision    From Standing, Reach Forward with Outstretched Arm Can reach forward >12 cm safely (5")    From Standing Position, Pick up Object from Floor Able to pick up shoe safely and easily    From Standing Position, Turn to Look Behind Over each Shoulder Looks behind one side only/other side shows less weight shift    Turn 360 Degrees Able to turn 360 degrees safely one side only in 4 seconds or less    Standing  Unsupported, Alternately Place Feet on Step/Stool Able to stand independently and safely and complete 8 steps in 20 seconds    Standing Unsupported, One Foot in [employer] balance while stepping or standing    Standing on One Leg Able to lift leg independently and hold 5-10 seconds    Total Score 46             OBJECTIVE FINDINGS  Posture Forward head rounded shoulders, rests in kyphotic posture in sitting   Gait Hyperextension of LLE during mid to late-stance phase, mild ataxia with decreased single-limb support time and moderate instability with loading response LLE>RLE   Strength R/L 5/4+ Hip flexion 4+/4+ Hip abduction 5/5 Hip adduction 5/4+ Knee extension 5/4 Knee flexion 5/5 Ankle Plantarflexion 5/4+  Ankle Dorsiflexion       FUNCTIONAL OUTCOME MEASURES         Results Comments  BERG Baseline: 44/56 11/23/20: 46/56  Above established cut-off score today  TUG Baseline: 11 seconds  11/18/20: Deferred    5TSTS Baseline: 13 seconds 11/23/20: 18.76 sec Fall risk  10 Meter Gait Speed Baseline: Self-selected: s = 0.91 m/s; Fastest: s = 1.20 m/s  11/23/20: Self-selected: s = 0.87 m/s  Fastest: s =1.17 m/s  >norm for community ambulator; below age-based norm  ABC Scale Baseline: 68.1% 11/23/20:  86.3%  Clinically significant change in ABC score        NuStep x 5 minutes, seat at 9, Level 4 - at beginning of session for warm-up to improve muscular performance/recruitment (unbilled time)     TREATMENT  Therapeutic Activities - patient education, HEP establishment; repetitive task practice for improved performance of daily functional activities e.g. transferring   Re-assessment performed (see above)  Patient education: Discussed current progress and results of functional outcome measures, prognosis, continued POC     *next visit* Sit to stand; with Airex under feet; 2x8 Standing toe tap, 12-inch step (two 6-inch steps stacked); 2x10 alternating (standing  at base of stairs), standing on Airex pad [intermittent LOB and difficulty targeting step] Sit to stand, x5 [intermittent posterior loss of balance after attaining standing position  Multidirectional standing on blue star; 6-way (anterior, anterolateral, lateral, posterolateral, posterior); x5 ea dir, bilat LE       *not today*  For HEP update: Standing heel raise/toe raise, with bilateral UE support on // bars; x10 Sidelying hip abduction; x10 [moderate cueing for positioning of LE and avoiding TFL compensation] 3-way toe tap on cone, 6-inch step; 2x10 alternating (standing at base of stairs)         ASSESSMENT Patient does appear to have more-significant flat affect and difficulties with postural control over the previous week. In spite of recent changes observed, patient has been able to meet BERG cut-off score (though he has not increased score enough to meet MDC/MCID) and demonstrates modest increase in L lower limb strength compared to his IE. He unfortunately has not demonstrated improvement in gait speed or 5TSTS. He subjectively feels that he has made apparent progress since initial eval, but he is falling frequently and needs further work on risk management. Discussed today using aid/SPC with gait over uneven terrain and walking outside of his home. He has attained clinically significant improvement in ABC scale, though validity of results can be confounded by recent changes with concerns for AMS. Patient has remaining deficits in postural control, L lower limb > R lower limb weakness, gait changes, equilibrium and non-equilibrium coordination deficits, and recurrent falls with significant retropulsion. Patient will benefit from continued skilled therapeutic intervention to address the above deficits as needed for improved function and QoL.       PT Short Term Goals - 11/24/20 6073       PT SHORT TERM GOAL #1   Title Pt will be independent with HEP in order to improve strength and  balance in order to decrease fall risk and improve function at home and work.    Baseline 10/14/20: HEP initiated.   11/23/20: Compliant with established HEP    Time 3    Period Weeks    Status Achieved    Target Date 10/28/20      PT SHORT TERM GOAL #2   Title Pt will perform 5TSTS in less than 12 seconds indicative of  no increased risk of falls based on established cut-off score    Baseline 10/14/20: 5TSTS in 13 sec.     11/23/20: 5TSTS in 18.76 sec    Time 3    Period Weeks    Status Not Met    Target Date 12/21/20               PT Long Term Goals - 11/23/20 1416       PT LONG TERM GOAL #1   Title Patient will demonstrate improved function as evidenced by a score of 55 on FOTO measure for full participation in activities at home and in the community.    Baseline 10/14/20: 43       11/23/20: 54    Time 8    Period Weeks    Status Partially Met    Target Date 12/14/20      PT LONG TERM GOAL #2   Title Pt will improve BERG by at least 3 points in order to demonstrate clinically significant improvement in balance.    Baseline 10/14/20: BERG to be performed next visit. 10/21/20: BERG 44.  11/23/20: BERG 46    Time 8    Period Weeks    Status Not Met    Target Date 12/14/20      PT LONG TERM GOAL #3   Title Pt will improve ABC by at least 13% in order to demonstrate clinically significant improvement in balance confidence.    Baseline 10/14/20: ABC to be obtained next visit. 10/26/20: 68.1%  11/23/20: 86.3%    Time 8    Period Weeks    Status Achieved    Target Date 12/09/20      PT LONG TERM GOAL #4   Title Patient will perform stair ascent and descent with no UE support and reciprocal pattern with no LOB and no major movement deviation (on 4 steps in clinic) simulating negotiating steps to enter/exit home    Baseline 10/14/20: Pt sometimes having falls with stair descent.  11/23/20: No recent falls related to stair descent, will formally assess stair negotiation next visit     Time 8    Period Weeks    Status Deferred    Target Date 12/09/20                   Plan - 11/24/20 0622     Clinical Impression Statement Patient does appear to have more-significant flat affect and difficulties with postural control over the previous week. In spite of recent changes observed, patient has been able to meet BERG cut-off score (though he has not increased score enough to meet MDC/MCID) and demonstrates modest increase in L lower limb strength compared to his IE. He unfortunately has not demonstrated improvement in gait speed or 5TSTS. He subjectively feels that he has made apparent progress since initial eval, but he is falling frequently and needs further work on risk management. Discussed today using aid/SPC with gait over uneven terrain and walking outside of his home. He has attained clinically significant improvement in ABC scale, though validity of results can be confounded by recent changes with concerns for AMS. Patient has remaining deficits in postural control, L lower limb > R lower limb weakness, gait changes, equilibrium and non-equilibrium coordination deficits, and recurrent falls with significant retropulsion. Patient will benefit from continued skilled therapeutic intervention to address the above deficits as needed for improved function and QoL.    Personal Factors and Comorbidities Comorbidity 3+;Fitness;Profession    Comorbidities  obesity, Type 2 DM, Hx of MI and cardiac stent, Hx of CVA, high cholesterol, smoking 1 pack/day    Examination-Activity Limitations Caring for Others;Stairs;Transfers;Locomotion Level;Bathing    Examination-Participation Restrictions Occupation;Interpersonal Relationship    Stability/Clinical Decision Making Evolving/Moderate complexity    Rehab Potential Good    PT Frequency 2x / week    PT Duration 8 weeks    PT Treatment/Interventions Therapeutic activities;Therapeutic exercise;Neuromuscular re-education;Manual  techniques;Patient/family education;Stair training;Gait training;Functional mobility training;Balance training    PT Next Visit Plan Progress with balance training, obstacle negotiation, reactive postural control, LLE proprioceptive training, LE strengthening.    PT Home Exercise Plan HEP Access Code [email or web address]    Consulted and Agree with Plan of Care Patient             Patient will benefit from skilled therapeutic intervention in order to improve the following deficits and impairments:  Decreased strength, Postural dysfunction, Decreased safety awareness, Decreased coordination, Abnormal gait, Decreased balance  Visit Diagnosis: Repeated falls  Muscle weakness (generalized)  Imbalance     Problem List Patient Active Problem List   Diagnosis Date Noted   Class 2 severe obesity due to excess calories with serious comorbidity and body mass index (BMI) of 36.0 to 36.9 in adult [facility]) 10/08/2020   Recurrent strokes ([place]) 08/27/2020   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 [place], [place], [address] Phone: [phone]   Fax:  [phone]  Name: Martin Mullen MRN: [id number] Date of Birth: [birth year]

## 2020-11-23 NOTE — Patient Instructions (Signed)
  NuStep x 5 minutes, seat at 9, Level 4 - at beginning of session for warm-up to improve muscular performance/recruitment (unbilled time)     TREATMENT     Neuromuscular Re-education - for improved postural control, sensory integration re-training, dynamic postural stability    In // bars:  Standing Semitandem on Airex, eyes closed; 2x30sec each Hurdle stepping, 3 6-inch hurdles; x6 D/B Lateral hurdle step, 3 6-inch hurdles; x6 D/B     Performed with treadmill handrail in front: Tandem standing; 2x30 sec on each side     Ball bounce off of wall; standing; x 1 min, with feet together   Rebounder with 2-lb medball; chest pass; 2x10, performed with feet together   Total Gym single-limb squat; Level 22; 2x10   Multidirectional stepping on blue star (8 directions), bilateral LE; single step and then back to center; x10 each direction with each LE       *not today* Standing FT with perturbations, *on Airex*; 2 x 1 min bouts. Multi-directional random perturbations (including single and combined planes) Lateral hurdle step-over, with brief SL balance; 2x10, alternating, 6-inch hurdle Closed-chain dorsiflexion/anterior ankle strategy with lean on wall; x20   L forward step up with R toe tap onto 2nd step; 6-inch step (using staircase in center of gym); 2x10     Therapeutic Activities - patient education, HEP establishment; repetitive task practice for improved performance of daily functional activities e.g. transferring   Sit to stand; with Airex under feet; 2x8   Standing toe tap, 12-inch step (two 6-inch steps stacked); 2x10 alternating (standing at base of stairs), standing on Airex pad       *not today*  For HEP update: Standing heel raise/toe raise, with bilateral UE support on // bars; x10 Sidelying hip abduction; x10 [moderate cueing for positioning of LE and avoiding TFL compensation] 3-way toe tap on cone, 6-inch step; 2x10 alternating (standing at base of stairs)          ASSESSMENT Patient demonstrates improving ability to maintain postural stability with narrow BOS and while standing on compliant surface. He is able to progress multi-directional stepping drill to include posterior and posterolateral steps with bilateral LE. Pt takes smaller steps with LLE, but he does not have LOB during this stepping drill. Pt does have remaining limitations with LLE coordination as demonstrated by intermittent difficulty targeting L lower limb with hurdle stepping. Patient has remaining deficits in sensory integration, postural control, gait instability, LLE proprioceptive/kinesthetic deficits, and LLE weakness associated with recurrent falls and increased fall risk. Patient will benefit from continued skilled therapeutic intervention to address the above deficits as needed for improved function and QoL.

## 2020-11-25 ENCOUNTER — Other Ambulatory Visit: Payer: Self-pay | Admitting: Internal Medicine

## 2020-11-25 ENCOUNTER — Encounter: Payer: BC Managed Care – PPO | Admitting: Physical Therapy

## 2020-11-25 ENCOUNTER — Encounter: Payer: Self-pay | Admitting: Physical Therapy

## 2020-11-25 ENCOUNTER — Other Ambulatory Visit: Payer: Self-pay

## 2020-11-25 ENCOUNTER — Ambulatory Visit: Payer: BC Managed Care – PPO | Admitting: Physical Therapy

## 2020-11-25 DIAGNOSIS — R2689 Other abnormalities of gait and mobility: Secondary | ICD-10-CM

## 2020-11-25 DIAGNOSIS — R296 Repeated falls: Secondary | ICD-10-CM

## 2020-11-25 DIAGNOSIS — M6281 Muscle weakness (generalized): Secondary | ICD-10-CM | POA: Diagnosis not present

## 2020-11-25 NOTE — Telephone Encounter (Signed)
Requested medications are due for refill today.  unknown  Requested medications are on the active medications list.  no  Last refill. 06/18/2020  Future visit scheduled.   yes  Notes to clinic.  Medication was discontinued by Dr. Blake Divine on 08/28/2020.

## 2020-11-25 NOTE — Therapy (Signed)
[facility] [address] [place], [place], [address] Phone: [phone]   Fax:  [phone]  Physical Therapy Treatment  Patient Details  Name: Martin Mullen MRN: [id number] Date of Birth: 09-15-1980 Referring Provider (PT): [name], NP   Encounter Date: 11/25/2020   PT End of Session - 11/25/20 1333     Visit Number 10    Number of Visits 17    Date for PT Re-Evaluation 12/09/20    Authorization Type [employer]; VL 60 visits PT/OT/Chiro combined per calendar year    Authorization Time Period Cert 1/32/44-02/15/70, prog note 11/23/20    Progress Note Due on Visit 17    PT Start Time 1330    PT Stop Time 1412    PT Time Calculation (min) 42 min    Equipment Utilized During Treatment Gait belt    Activity Tolerance Patient tolerated treatment well    Behavior During Therapy [facility] for tasks assessed/performed             Past Medical History:  Diagnosis Date   Acute transmural inferior wall MI ([place]) 08/28/2012   .5 x 12 mm Veri-flex stent non-DES.   Chicken pox    Coronary artery disease    Inferior ST elevation myocardial infarction in July of 2014. Cardiac catheterization showed 95% distal RCA stenosis and 90% first diagonal stenosis with normal ejection fraction. He underwent PCI in bare-metal stent placement to the distal RCA.   Diabetes mellitus without complication ([place])    Hypercholesterolemia    Stroke ([place])    Tobacco use     Past Surgical History:  Procedure Laterality Date   CORONARY ANGIOPLASTY WITH STENT PLACEMENT     DEBRIDEMENT AND CLOSURE WOUND N/A 09/03/2019   Procedure: Debridement and closure of scrotal wound;  Surgeon: Cindra Presume, MD;  Location: [place];  Service: Plastics;  Laterality: N/A;   DEBRIDEMENT AND CLOSURE WOUND N/A 07/25/2019   Procedure: DEBRIDEMENT AND CLOSURE WOUND;  Surgeon: Cindra Presume, MD;  Location: [facility];  Service: Plastics;  Laterality: N/A;    INCISION AND DRAINAGE ABSCESS N/A 07/20/2019   Procedure: INCISION AND DRAINAGE ABSCESS;  Surgeon: Lucas Mallow, MD;  Location: [facility];  Service: Urology;  Laterality: N/A;   LEFT HEART CATHETERIZATION WITH CORONARY ANGIOGRAM N/A 08/28/2012   Procedure: LEFT HEART CATHETERIZATION WITH CORONARY ANGIOGRAM;  Surgeon: Laverda Page, MD;  Location: [facility] CATH LAB;  Service: Cardiovascular;  Laterality: N/A;   TEE WITHOUT CARDIOVERSION N/A 09/23/2020   Procedure: TRANSESOPHAGEAL ECHOCARDIOGRAM (TEE);  Surgeon: Corey Skains, MD;  Location: [facility];  Service: Cardiovascular;  Laterality: N/A;   WRIST SURGERY  1988    There were no vitals filed for this visit.   Subjective Assessment - 11/25/20 1332     Subjective Patient reports no additional falls or near-falls since his last visit in PT. He reports feeling fairly well at arrival to PT. Patient reports tolerating his last visit well.    Pertinent History 40 year old male with primary complaint of recurrent falls; L-sided weakness and imbalance s/p CVA (admitted for CVA 08/26/20). Patient is a 40 year old male with involved medical history previously seen for neck/shoulder pain. He was referred to ED with concerning neurological signs toward end of his course of PT. Patient had basal ganglia lacunar type infarct. Patient currently reports having frequent falls. His PCP thinks this may be due to L leg weakness. He reports having sudden falls with walking  across his home and with going down front steps of his home. He states he often falls backward onto his seat when going to stand. He reports some difficulty with dysphagia. He denies issue with choking on his food. Patient reports no major change in sensation. 5 steps to exit home. Patient had 2 handrails to get up steps. Patient reports having incidents of LOB while in the shower with his eyes closed. He reports more urinary urgency. Pt has 2 young kids that are often crawling around his household.  [employer]. Pt reports some difficulty with car transfer- lifting his LLE.  No previous PT for this condition. Directional pattern for falls: Posterior usually.    Limitations Walking;House hold activities;Standing    Diagnostic tests Brain MRI - see below    Patient Stated Goals Decreased falls, improved balance    Pain Onset 1 to 4 weeks ago               NuStep x 5 minutes, seat at 9, Level 4 - at beginning of session for warm-up to improve muscular performance/recruitment (unbilled time)     TREATMENT     Neuromuscular Re-education - for improved postural control, sensory integration re-training, dynamic postural stability    In // bars:  Standing Tandem on Airex, eyes open; 1x30sec each [frequent touch on // bars] Standing Tandem on floor, eyes open; 2x30sec each Forward hurdle stepping, 3 6-inch hurdles; x6 D/B Lateral hurdle step, 3 6-inch hurdles; x6 D/B     Rebounder with 2-lb medball; chest pass; x 1 min, performed with feet together   Total Gym single-limb squat; Level 22; 2x12   Multidirectional stepping on blue star (8 directions), bilateral LE; single step and then back to center; x8 each direction with each LE   Standing FT with perturbations, x 1 min. Multi-directional random perturbations (including single and combined planes)  [*add Airex next visit*]     *not today* Ball bounce off of wall; standing; x 1 min, with feet together Closed-chain dorsiflexion/anterior ankle strategy with lean on wall; x20 L forward step up with R toe tap onto 2nd step; 6-inch step (using staircase in center of gym); 2x10      Therapeutic Activities - patient education, HEP establishment; repetitive task practice for improved performance of daily functional activities e.g. transferring    In // bars:  -Standing toe tap, 6-inch step; 2x10 alternating, standing on Airex pad -Gait over floor mat; 2x D/B with no AD, 3x D/B with SPC RUE for trial of gait c SPC for  uneven ground.   Gait outside over grass and uneven ground with SPC (outside of clinic) following demonstration for proper sequencing and placement x 4 minutes       *not today*  Sit to stand; with Airex under feet; 2x8 For HEP update: Standing heel raise/toe raise, with bilateral UE support on // bars; x10 Sidelying hip abduction; x10 [moderate cueing for positioning of LE and avoiding TFL compensation] 3-way toe tap on cone, 6-inch step; 2x10 alternating (standing at base of stairs)         ASSESSMENT Patient has ongoing LLE weakness with L knee hyperextension following mid-stance of gait and intermittent instability with change-of-direction. He has weakness with active open-chain hip flexion noted with significant challenge clearing hurdles and lifting lower limb to 6-inch step. He exhibits improved gait pattern with decreased lateral stagger with use of SPC. Trialed use of SPC on uneven terrain outside, and patient demonstrates good sequencing and safe  use of SPC. Discussed use of SPC during outdoor walks with patient to prevent recurrent falls and potential serious injury. Patient has remaining deficits in sensory integration, postural control, gait instability, LLE proprioceptive/kinesthetic deficits, and LLE weakness associated with recurrent falls and increased fall risk. Patient will benefit from continued skilled therapeutic intervention to address the above deficits as needed for improved function and QoL.       PT Short Term Goals - 11/24/20 2248       PT SHORT TERM GOAL #1   Title Pt will be independent with HEP in order to improve strength and balance in order to decrease fall risk and improve function at home and work.    Baseline 10/14/20: HEP initiated.   11/23/20: Compliant with established HEP    Time 3    Period Weeks    Status Achieved    Target Date 10/28/20      PT SHORT TERM GOAL #2   Title Pt will perform 5TSTS in less than 12 seconds indicative of no increased  risk of falls based on established cut-off score    Baseline 10/14/20: 5TSTS in 13 sec.     11/23/20: 5TSTS in 18.76 sec    Time 3    Period Weeks    Status Not Met    Target Date 12/21/20               PT Long Term Goals - 11/23/20 1416       PT LONG TERM GOAL #1   Title Patient will demonstrate improved function as evidenced by a score of 55 on FOTO measure for full participation in activities at home and in the community.    Baseline 10/14/20: 43       11/23/20: 54    Time 8    Period Weeks    Status Partially Met    Target Date 12/14/20      PT LONG TERM GOAL #2   Title Pt will improve BERG by at least 3 points in order to demonstrate clinically significant improvement in balance.    Baseline 10/14/20: BERG to be performed next visit. 10/21/20: BERG 44.  11/23/20: BERG 46    Time 8    Period Weeks    Status Not Met    Target Date 12/14/20      PT LONG TERM GOAL #3   Title Pt will improve ABC by at least 13% in order to demonstrate clinically significant improvement in balance confidence.    Baseline 10/14/20: ABC to be obtained next visit. 10/26/20: 68.1%  11/23/20: 86.3%    Time 8    Period Weeks    Status Achieved    Target Date 12/09/20      PT LONG TERM GOAL #4   Title Patient will perform stair ascent and descent with no UE support and reciprocal pattern with no LOB and no major movement deviation (on 4 steps in clinic) simulating negotiating steps to enter/exit home    Baseline 10/14/20: Pt sometimes having falls with stair descent.  11/23/20: No recent falls related to stair descent, will formally assess stair negotiation next visit    Time 8    Period Weeks    Status Deferred    Target Date 12/09/20                   Plan - 11/26/20 1425     Clinical Impression Statement Patient has ongoing LLE weakness with L knee hyperextension following mid-stance of  gait and intermittent instability with change-of-direction. He has weakness with active open-chain  hip flexion noted with significant challenge clearing hurdles and lifting lower limb to 6-inch step. He exhibits improved gait pattern with decreased lateral stagger with use of SPC. Trialed use of SPC on uneven terrain outside, and patient demonstrates good sequencing and safe use of SPC. Discussed use of SPC during outdoor walks with patient to prevent recurrent falls and potential serious injury. Patient has remaining deficits in sensory integration, postural control, gait instability, LLE proprioceptive/kinesthetic deficits, and LLE weakness associated with recurrent falls and increased fall risk. Patient will benefit from continued skilled therapeutic intervention to address the above deficits as needed for improved function and QoL.    Personal Factors and Comorbidities Comorbidity 3+;Fitness;Profession    Comorbidities obesity, Type 2 DM, Hx of MI and cardiac stent, Hx of CVA, high cholesterol, smoking 1 pack/day    Examination-Activity Limitations Caring for Others;Stairs;Transfers;Locomotion Level;Bathing    Examination-Participation Restrictions Occupation;Interpersonal Relationship    Stability/Clinical Decision Making Evolving/Moderate complexity    Rehab Potential Good    PT Frequency 2x / week    PT Duration 8 weeks    PT Treatment/Interventions Therapeutic activities;Therapeutic exercise;Neuromuscular re-education;Manual techniques;Patient/family education;Stair training;Gait training;Functional mobility training;Balance training    PT Next Visit Plan Progress with balance training, obstacle negotiation, reactive postural control, LLE proprioceptive training, LE strengthening.    PT Home Exercise Plan HEP Access Code [email or web address]    Consulted and Agree with Plan of Care Patient             Patient will benefit from skilled therapeutic intervention in order to improve the following deficits and impairments:  Decreased strength, Postural dysfunction, Decreased safety awareness, Decreased  coordination, Abnormal gait, Decreased balance  Visit Diagnosis: Repeated falls  Muscle weakness (generalized)  Imbalance     Problem List Patient Active Problem List   Diagnosis Date Noted   Class 2 severe obesity due to excess calories with serious comorbidity and body mass index (BMI) of 36.0 to 36.9 in adult [facility]) 10/08/2020   Recurrent strokes (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 [place], [place], [address] Phone: [phone]   Fax:  [phone]  Name: MANOAH DECKARD MRN: [id number] Date of Birth: 12/30/1980

## 2020-11-30 ENCOUNTER — Ambulatory Visit: Payer: BC Managed Care – PPO | Admitting: Physical Therapy

## 2020-11-30 ENCOUNTER — Other Ambulatory Visit: Payer: Self-pay

## 2020-11-30 ENCOUNTER — Encounter: Payer: BC Managed Care – PPO | Admitting: Physical Therapy

## 2020-11-30 DIAGNOSIS — R2689 Other abnormalities of gait and mobility: Secondary | ICD-10-CM | POA: Diagnosis not present

## 2020-11-30 DIAGNOSIS — M6281 Muscle weakness (generalized): Secondary | ICD-10-CM

## 2020-11-30 DIAGNOSIS — R296 Repeated falls: Secondary | ICD-10-CM | POA: Diagnosis not present

## 2020-11-30 NOTE — Therapy (Signed)
[facility] [place] [address] [place], [place], [address] Phone: [phone]   Fax:  [phone]  Physical Therapy Treatment  Patient Details  Name: Martin Mullen MRN: [id number] Date of Birth: 24-Mar-1980 Referring Provider (PT): [name], NP   Encounter Date: 11/30/2020   PT End of Session - 12/01/20 0720     Visit Number 11    Number of Visits 17    Date for PT Re-Evaluation 12/09/20    Authorization Type [employer]; VL 60 visits PT/OT/Chiro combined per calendar year    Authorization Time Period Cert 9/44/96-75/91/63, prog note 11/23/20    Progress Note Due on Visit 17    PT Start Time 1330    PT Stop Time 1415    PT Time Calculation (min) 45 min    Equipment Utilized During Treatment Gait belt    Activity Tolerance Patient tolerated treatment well    Behavior During Therapy [facility] for tasks assessed/performed             Past Medical History:  Diagnosis Date   Acute transmural inferior wall MI ([place]) 08/28/2012   .5 x 12 mm Veri-flex stent non-DES.   Chicken pox    Coronary artery disease    Inferior ST elevation myocardial infarction in July of 2014. Cardiac catheterization showed 95% distal RCA stenosis and 90% first diagonal stenosis with normal ejection fraction. He underwent PCI in bare-metal stent placement to the distal RCA.   Diabetes mellitus without complication ([place])    Hypercholesterolemia    Stroke ([place])    Tobacco use     Past Surgical History:  Procedure Laterality Date   CORONARY ANGIOPLASTY WITH STENT PLACEMENT     DEBRIDEMENT AND CLOSURE WOUND N/A 09/03/2019   Procedure: Debridement and closure of scrotal wound;  Surgeon: Cindra Presume, MD;  Location: [place];  Service: Plastics;  Laterality: N/A;   DEBRIDEMENT AND CLOSURE WOUND N/A 07/25/2019   Procedure: DEBRIDEMENT AND CLOSURE WOUND;  Surgeon: Cindra Presume, MD;  Location: [facility];  Service: Plastics;  Laterality: N/A;    INCISION AND DRAINAGE ABSCESS N/A 07/20/2019   Procedure: INCISION AND DRAINAGE ABSCESS;  Surgeon: Lucas Mallow, MD;  Location: [facility];  Service: Urology;  Laterality: N/A;   LEFT HEART CATHETERIZATION WITH CORONARY ANGIOGRAM N/A 08/28/2012   Procedure: LEFT HEART CATHETERIZATION WITH CORONARY ANGIOGRAM;  Surgeon: Laverda Page, MD;  Location: [facility] CATH LAB;  Service: Cardiovascular;  Laterality: N/A;   TEE WITHOUT CARDIOVERSION N/A 09/23/2020   Procedure: TRANSESOPHAGEAL ECHOCARDIOGRAM (TEE);  Surgeon: Corey Skains, MD;  Location: [facility];  Service: Cardiovascular;  Laterality: N/A;   WRIST SURGERY  1988    There were no vitals filed for this visit.   Subjective Assessment - 11/30/20 1333     Subjective Patient reports no recent falls or near-falls. Patient reports no new complaints at arrival to PT. He is unsure about using AD at this time and has not looked further into this since last week. Patient reports compliance with HEP.    Pertinent History 40 year old male with primary complaint of recurrent falls; L-sided weakness and imbalance s/p CVA (admitted for CVA 08/26/20). Patient is a 40 year old male with involved medical history previously seen for neck/shoulder pain. He was referred to ED with concerning neurological signs toward end of his course of PT. Patient had basal ganglia lacunar type infarct. Patient currently reports having frequent falls. His PCP thinks this may be due  to L leg weakness. He reports having sudden falls with walking across his home and with going down front steps of his home. He states he often falls backward onto his seat when going to stand. He reports some difficulty with dysphagia. He denies issue with choking on his food. Patient reports no major change in sensation. 5 steps to exit home. Patient had 2 handrails to get up steps. Patient reports having incidents of LOB while in the shower with his eyes closed. He reports more urinary urgency. Pt has 2  young kids that are often crawling around his household. [employer]. Pt reports some difficulty with car transfer- lifting his LLE.  No previous PT for this condition. Directional pattern for falls: Posterior usually.    Limitations Walking;House hold activities;Standing    Diagnostic tests Brain MRI - see below    Patient Stated Goals Decreased falls, improved balance    Pain Onset 1 to 4 weeks ago                   TREATMENT   NuStep, seat at 9; Level 5 for 3 minutes, Level 4 for 2 minutes to adjust challenge/intensity prn - at beginning of session for warm-up to improve muscular performance/recruitment (unbilled time)     Neuromuscular Re-education - for improved postural control, sensory integration re-training, dynamic postural stability    In // bars:   Standing Semitandem on Airex, eyes open; 4x10sec bilaterally [decreased duration due to intermittent LOB today] Forward hurdle stepping, 3 6-inch hurdles; x6 D/B Lateral hurdle step, 3 6-inch hurdles; x6 D/B   Rebounder with 2-lb medball; chest pass; x 1 min, performed with feet together, on Airex   Total Gym single-limb squat; Level 22; 2x12   Multidirectional stepping on blue star (8 directions), bilateral LE; single step and then back to center; x8 each direction with each LE   Standing FT with perturbations, with Airex; x 2 min. Multi-directional random perturbations (including single and combined planes, significant difficulty c posterior perturbation)       *not today* Standing Tandem on Airex, eyes open; 1x30sec each [frequent touch on // bars] Ball bounce off of wall; standing; x 1 min, with feet together Closed-chain dorsiflexion/anterior ankle strategy with lean on wall; x20 L forward step up with R toe tap onto 2nd step; 6-inch step (using staircase in center of gym); 2x10       Therapeutic Activities - patient education, HEP establishment; repetitive task practice for improved performance of daily  functional activities e.g. transferring     In // bars:  -Standing toe tap, 6-inch step; 2x10 alternating, standing on Airex pad -Forward and retro stepping; x5 D/B [heavy cueing for controlled velocity with retro step, dec speed]    Pt edu: Reviewed recommendation for use of AD with uneven ground     *not today*  -Gait over floor mat; 2x D/B with no AD, 3x D/B with SPC RUE for trial of gait c SPC for uneven ground.  Gait outside over grass and uneven ground with SPC (outside of clinic) following demonstration for proper sequencing and placement x 4 minutes Sit to stand; with Airex under feet; 2x8 For HEP update: Standing heel raise/toe raise, with bilateral UE support on // bars; x10 Sidelying hip abduction; x10 [moderate cueing for positioning of LE and avoiding TFL compensation] 3-way toe tap on cone, 6-inch step; 2x10 alternating (standing at base of stairs)         ASSESSMENT Patient demonstrates frequent retropulsion  during standing and stepping activity in clinic. He has frequent LOB with weight shift onto LLE. He demonstrates hyperextension of L lower limb during mid-stance of gait; continuing work on strengthening to improve quadriceps control and avoidance of frequent hyperextension during closed-chain loading. Patient is significantly challenged with static balance tasks with narrow BOS and uneven surfaces. Patient's ability to perform postural stability challenges and sensory integration has been variable over the previous 3 weeks. Patient has remaining deficits in sensory integration, postural control, gait instability, LLE proprioceptive/kinesthetic deficits, and LLE weakness associated with recurrent falls and increased fall risk. Patient will benefit from continued skilled therapeutic intervention to address the above deficits as needed for improved function and QoL.       PT Short Term Goals - 11/24/20 3007       PT SHORT TERM GOAL #1   Title Pt will be independent  with HEP in order to improve strength and balance in order to decrease fall risk and improve function at home and work.    Baseline 10/14/20: HEP initiated.   11/23/20: Compliant with established HEP    Time 3    Period Weeks    Status Achieved    Target Date 10/28/20      PT SHORT TERM GOAL #2   Title Pt will perform 5TSTS in less than 12 seconds indicative of no increased risk of falls based on established cut-off score    Baseline 10/14/20: 5TSTS in 13 sec.     11/23/20: 5TSTS in 18.76 sec    Time 3    Period Weeks    Status Not Met    Target Date 12/21/20               PT Long Term Goals - 11/23/20 1416       PT LONG TERM GOAL #1   Title Patient will demonstrate improved function as evidenced by a score of 55 on FOTO measure for full participation in activities at home and in the community.    Baseline 10/14/20: 43       11/23/20: 54    Time 8    Period Weeks    Status Partially Met    Target Date 12/14/20      PT LONG TERM GOAL #2   Title Pt will improve BERG by at least 3 points in order to demonstrate clinically significant improvement in balance.    Baseline 10/14/20: BERG to be performed next visit. 10/21/20: BERG 44.  11/23/20: BERG 46    Time 8    Period Weeks    Status Not Met    Target Date 12/14/20      PT LONG TERM GOAL #3   Title Pt will improve ABC by at least 13% in order to demonstrate clinically significant improvement in balance confidence.    Baseline 10/14/20: ABC to be obtained next visit. 10/26/20: 68.1%  11/23/20: 86.3%    Time 8    Period Weeks    Status Achieved    Target Date 12/09/20      PT LONG TERM GOAL #4   Title Patient will perform stair ascent and descent with no UE support and reciprocal pattern with no LOB and no major movement deviation (on 4 steps in clinic) simulating negotiating steps to enter/exit home    Baseline 10/14/20: Pt sometimes having falls with stair descent.  11/23/20: No recent falls related to stair descent, will  formally assess stair negotiation next visit    Time 8  Period Weeks    Status Deferred    Target Date 12/09/20                   Plan - 12/01/20 0727     Clinical Impression Statement Patient demonstrates frequent retropulsion during standing and stepping activity in clinic. He has frequent LOB with weight shift onto LLE. He demonstrates hyperextension of L lower limb during mid-stance of gait; continuing work on strengthening to improve quadriceps control and avoidance of frequent hyperextension during closed-chain loading. Patient is significantly challenged with static balance tasks with narrow BOS and uneven surfaces. Patient's ability to perform postural stability challenges and sensory integration has been variable over the previous 3 weeks. Patient has remaining deficits in sensory integration, postural control, gait instability, LLE proprioceptive/kinesthetic deficits, and LLE weakness associated with recurrent falls and increased fall risk. Patient will benefit from continued skilled therapeutic intervention to address the above deficits as needed for improved function and QoL.    Personal Factors and Comorbidities Comorbidity 3+;Fitness;Profession    Comorbidities obesity, Type 2 DM, Hx of MI and cardiac stent, Hx of CVA, high cholesterol, smoking 1 pack/day    Examination-Activity Limitations Caring for Others;Stairs;Transfers;Locomotion Level;Bathing    Examination-Participation Restrictions Occupation;Interpersonal Relationship    Stability/Clinical Decision Making Evolving/Moderate complexity    Rehab Potential Good    PT Frequency 2x / week    PT Duration 8 weeks    PT Treatment/Interventions Therapeutic activities;Therapeutic exercise;Neuromuscular re-education;Manual techniques;Patient/family education;Stair training;Gait training;Functional mobility training;Balance training    PT Next Visit Plan Progress with balance training, obstacle negotiation, reactive postural  control, LLE proprioceptive training, LE strengthening.    PT Home Exercise Plan HEP Access Code [email or web address]    Consulted and Agree with Plan of Care Patient             Patient will benefit from skilled therapeutic intervention in order to improve the following deficits and impairments:  Decreased strength, Postural dysfunction, Decreased safety awareness, Decreased coordination, Abnormal gait, Decreased balance  Visit Diagnosis: Repeated falls  Muscle weakness (generalized)  Imbalance     Problem List Patient Active Problem List   Diagnosis Date Noted   Class 2 severe obesity due to excess calories with serious comorbidity and body mass index (BMI) of 36.0 to 36.9 in adult [facility]) 10/08/2020   Recurrent strokes ([place]) 08/27/2020   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 [place], [place], [address] Phone: [phone]   Fax:  [phone]  Name: Martin Mullen MRN: [id number] Date of Birth: 08/13/1980

## 2020-12-01 ENCOUNTER — Encounter: Payer: Self-pay | Admitting: Physical Therapy

## 2020-12-02 ENCOUNTER — Other Ambulatory Visit: Payer: Self-pay

## 2020-12-02 ENCOUNTER — Encounter: Payer: Self-pay | Admitting: Physical Therapy

## 2020-12-02 ENCOUNTER — Encounter: Payer: BC Managed Care – PPO | Admitting: Physical Therapy

## 2020-12-02 ENCOUNTER — Ambulatory Visit: Payer: BC Managed Care – PPO | Admitting: Physical Therapy

## 2020-12-02 DIAGNOSIS — R296 Repeated falls: Secondary | ICD-10-CM

## 2020-12-02 DIAGNOSIS — R2689 Other abnormalities of gait and mobility: Secondary | ICD-10-CM | POA: Diagnosis not present

## 2020-12-02 DIAGNOSIS — M6281 Muscle weakness (generalized): Secondary | ICD-10-CM

## 2020-12-02 NOTE — Therapy (Signed)
[facility] [address]. [place], [place], [address] Phone: [phone]   Fax:  [phone]  Physical Therapy Treatment  Patient Details  Name: Martin Mullen MRN: [id number] Date of Birth: [birth year] Referring Provider (PT): [name], NP   Encounter Date: 12/02/2020   PT End of Session - 12/03/20 2248     Visit Number 12    Number of Visits 17    Date for PT Re-Evaluation 12/09/20    Authorization Type BCBS; VL 60 visits PT/OT/Chiro combined per calendar year    Authorization Time Period Cert 05/31/58-63/01/60, prog note 11/23/20    Progress Note Due on Visit 17    PT Start Time 1327    PT Stop Time 1415    PT Time Calculation (min) 48 min    Equipment Utilized During Treatment Gait belt    Activity Tolerance Patient tolerated treatment well    Behavior During Therapy Flat affect              Past Medical History:  Diagnosis Date   Acute transmural inferior wall MI ([place]) 08/28/2012   .5 x 12 mm Veri-flex stent non-DES.   Chicken pox    Coronary artery disease    Inferior ST elevation myocardial infarction in July of 2014. Cardiac catheterization showed 95% distal RCA stenosis and 90% first diagonal stenosis with normal ejection fraction. He underwent PCI in bare-metal stent placement to the distal RCA.   Diabetes mellitus without complication ([place])    Hypercholesterolemia    Stroke ([place])    Tobacco use     Past Surgical History:  Procedure Laterality Date   CORONARY ANGIOPLASTY WITH STENT PLACEMENT     DEBRIDEMENT AND CLOSURE WOUND N/A 09/03/2019   Procedure: Debridement and closure of scrotal wound;  Surgeon: Cindra Presume, MD;  Location: [place];  Service: Plastics;  Laterality: N/A;   DEBRIDEMENT AND CLOSURE WOUND N/A 07/25/2019   Procedure: DEBRIDEMENT AND CLOSURE WOUND;  Surgeon: Cindra Presume, MD;  Location: [facility];  Service: Plastics;  Laterality: N/A;   INCISION AND DRAINAGE  ABSCESS N/A 07/20/2019   Procedure: INCISION AND DRAINAGE ABSCESS;  Surgeon: Lucas Mallow, MD;  Location: [facility];  Service: Urology;  Laterality: N/A;   LEFT HEART CATHETERIZATION WITH CORONARY ANGIOGRAM N/A 08/28/2012   Procedure: LEFT HEART CATHETERIZATION WITH CORONARY ANGIOGRAM;  Surgeon: Laverda Page, MD;  Location: [facility] C[facility];  Service: Cardiovascular;  Laterality: N/A;   TEE WITHOUT CARDIOVERSION N/A 09/23/2020   Procedure: TRANSESOPHAGEAL ECHOCARDIOGRAM (TEE);  Surgeon: Corey Skains, MD;  Location: [facility];  Service: Cardiovascular;  Laterality: N/A;   WRIST SURGERY  1988    There were no vitals filed for this visit.   Subjective Assessment - 12/03/20 2248     Subjective Patient arrives with broken frames on his glasses - his child accidentally broke R side of frames. Patient reports no recent falls or safety concerns at home. Patient reports no further updates at arrival to PT.    Pertinent History 40 year old male with primary complaint of recurrent falls; L-sided weakness and imbalance s/p CVA (admitted for CVA 08/26/20). Patient is a 40 year old male with involved medical history previously seen for neck/shoulder pain. He was referred to ED with concerning neurological signs toward end of his course of PT. Patient had basal ganglia lacunar type infarct. Patient currently reports having frequent falls. His PCP thinks this may be due to L leg weakness. He  reports having sudden falls with walking across his home and with going down front steps of his home. He states he often falls backward onto his seat when going to stand. He reports some difficulty with dysphagia. He denies issue with choking on his food. Patient reports no major change in sensation. 5 steps to exit home. Patient had 2 handrails to get up steps. Patient reports having incidents of LOB while in the shower with his eyes closed. He reports more urinary urgency. Pt has 2 young kids that are often crawling around  his household. [employer]. Pt reports some difficulty with car transfer- lifting his LLE.  No previous PT for this condition. Directional pattern for falls: Posterior usually.    Limitations Walking;House hold activities;Standing    Diagnostic tests Brain MRI - see below    Patient Stated Goals Decreased falls, improved balance    Pain Onset 1 to 4 weeks ago                 TREATMENT   NuStep, seat at 9; Level 4 for 5 minutes - at beginning of session for warm-up to improve muscular performance/recruitment (unbilled time)     Neuromuscular Re-education - for improved postural control, sensory integration re-training, dynamic postural stability    In // bars:    Standing Semitandem on Airex, eyes open; 4x10sec bilaterally [decreased duration due to intermittent LOB today] Forward hurdle stepping, 3 6-inch hurdles; x6 D/B Lateral hurdle step, 3 6-inch hurdles; x6 D/B   Ball bounce off of wall; split stance; x 1 min each position (R foot forward, then L foot forward)   Total Gym single-limb squat; Level 22; 2x12 [tactile cueing to prevent hyperextension of L lower limb]   Multidirectional stepping on blue star (8 directions), bilateral LE; single step and then back to center; x8 each direction with each LE   Standing FT with perturbations, with Airex; x 2 min. Multi-directional random perturbations (including single and combined planes, significant difficulty c posterior perturbation) - regressed to standing feet apart today due to significant difficulty maintaining static postural control with feet together on Airex       *not today* Rebounder with 2-lb medball; chest pass; x 1 min, performed with feet together, on Airex Standing Tandem on Airex, eyes open; 1x30sec each [frequent touch on // bars] Closed-chain dorsiflexion/anterior ankle strategy with lean on wall; x20 L forward step up with R toe tap onto 2nd step; 6-inch step (using staircase in center of gym); 2x10        Therapeutic Activities - patient education, HEP establishment; repetitive task practice for improved performance of daily functional activities e.g. transferring   Sit to stand; with Airex under feet; 2x10, intermittent posterior LOB at top of standing, cueing and intermittent MaxA to prevent fall posteriorly   In // bars:  -Standing toe tap, 6-inch step; 2x10 alternating, standing on Airex pad -Forward and retro stepping; x5 D/B [heavy cueing for controlled velocity with retro step, dec speed]       *not today*  -Gait over floor mat; 2x D/B with no AD, 3x D/B with SPC RUE for trial of gait c SPC for uneven ground.  Gait outside over grass and uneven ground with SPC (outside of clinic) following demonstration for proper sequencing and placement x 4 minutes Standing heel raise/toe raise, with bilateral UE support on // bars; x10 Sidelying hip abduction; x10 [moderate cueing for positioning of LE and avoiding TFL compensation] 3-way toe tap on cone, 6-inch  step; 2x10 alternating (standing at base of stairs)         ASSESSMENT Patient has ongoing significant postural control and obstacle negotiation deficits with notable LLE weakness and impaired proprioceptive/kinesthetic awareness on L>R side. Patient has ongoing bias toward falling posteriorly with majority of drills performed. Given promising early progress and recent change in postural control and motor performance over the previous 2 weeks (referred to MD and patient followed up with neurology with no new infarct per testing completed) as well as change in affect and safety awareness, discussed current presentation with patient's referring provider. Webb Silversmith, NP is currently planning to f/u with the patient. Patient has remaining deficits in sensory integration, postural control, gait instability, LLE proprioceptive/kinesthetic deficits, and LLE weakness associated with recurrent falls and increased fall risk. Patient will benefit  from continued skilled therapeutic intervention to address the above deficits as needed for improved function and QoL.         PT Short Term Goals - 11/24/20 4401       PT SHORT TERM GOAL #1   Title Pt will be independent with HEP in order to improve strength and balance in order to decrease fall risk and improve function at home and work.    Baseline 10/14/20: HEP initiated.   11/23/20: Compliant with established HEP    Time 3    Period Weeks    Status Achieved    Target Date 10/28/20      PT SHORT TERM GOAL #2   Title Pt will perform 5TSTS in less than 12 seconds indicative of no increased risk of falls based on established cut-off score    Baseline 10/14/20: 5TSTS in 13 sec.     11/23/20: 5TSTS in 18.76 sec    Time 3    Period Weeks    Status Not Met    Target Date 12/21/20               PT Long Term Goals - 11/23/20 1416       PT LONG TERM GOAL #1   Title Patient will demonstrate improved function as evidenced by a score of 55 on FOTO measure for full participation in activities at home and in the community.    Baseline 10/14/20: 43       11/23/20: 54    Time 8    Period Weeks    Status Partially Met    Target Date 12/14/20      PT LONG TERM GOAL #2   Title Pt will improve BERG by at least 3 points in order to demonstrate clinically significant improvement in balance.    Baseline 10/14/20: BERG to be performed next visit. 10/21/20: BERG 44.  11/23/20: BERG 46    Time 8    Period Weeks    Status Not Met    Target Date 12/14/20      PT LONG TERM GOAL #3   Title Pt will improve ABC by at least 13% in order to demonstrate clinically significant improvement in balance confidence.    Baseline 10/14/20: ABC to be obtained next visit. 10/26/20: 68.1%  11/23/20: 86.3%    Time 8    Period Weeks    Status Achieved    Target Date 12/09/20      PT LONG TERM GOAL #4   Title Patient will perform stair ascent and descent with no UE support and reciprocal pattern with no LOB and  no major movement deviation (on 4 steps in clinic) simulating negotiating  steps to enter/exit home    Baseline 10/14/20: Pt sometimes having falls with stair descent.  11/23/20: No recent falls related to stair descent, will formally assess stair negotiation next visit    Time 8    Period Weeks    Status Deferred    Target Date 12/09/20                   Plan - 12/03/20 2253     Clinical Impression Statement Patient has ongoing significant postural control and obstacle negotiation deficits with notable LLE weakness and impaired proprioceptive/kinesthetic awareness on L>R side. Patient has ongoing bias toward falling posteriorly with majority of drills performed. Given promising early progress and recent change in postural control and motor performance over the previous 2 weeks (referred to MD and patient followed up with neurology with no new infarct per testing completed) as well as change in affect and safety awareness, discussed current presentation with patient's referring provider. Webb Silversmith, NP is currently planning to f/u with the patient. Patient has remaining deficits in sensory integration, postural control, gait instability, LLE proprioceptive/kinesthetic deficits, and LLE weakness associated with recurrent falls and increased fall risk. Patient will benefit from continued skilled therapeutic intervention to address the above deficits as needed for improved function and QoL.    Personal Factors and Comorbidities Comorbidity 3+;Fitness;Profession    Comorbidities obesity, Type 2 DM, Hx of MI and cardiac stent, Hx of CVA, high cholesterol, smoking 1 pack/day    Examination-Activity Limitations Caring for Others;Stairs;Transfers;Locomotion Level;Bathing    Examination-Participation Restrictions Occupation;Interpersonal Relationship    Stability/Clinical Decision Making Evolving/Moderate complexity    Rehab Potential Good    PT Frequency 2x / week    PT Duration 8 weeks    PT  Treatment/Interventions Therapeutic activities;Therapeutic exercise;Neuromuscular re-education;Manual techniques;Patient/family education;Stair training;Gait training;Functional mobility training;Balance training    PT Next Visit Plan Progress with balance training, obstacle negotiation, reactive postural control, LLE proprioceptive training, LE strengthening.    PT Home Exercise Plan HEP Access Code [email or web address]    Consulted and Agree with Plan of Care Patient             Patient will benefit from skilled therapeutic intervention in order to improve the following deficits and impairments:  Decreased strength, Postural dysfunction, Decreased safety awareness, Decreased coordination, Abnormal gait, Decreased balance  Visit Diagnosis: Repeated falls  Muscle weakness (generalized)  Imbalance     Problem List Patient Active Problem List   Diagnosis Date Noted   Class 2 severe obesity due to excess calories with serious comorbidity and body mass index (BMI) of 36.0 to 36.9 in adult [facility]) 10/08/2020   Recurrent strokes ([place]) 08/27/2020   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 [place], [place], [address] Phone: [phone]   Fax:  [phone]  Name: Martin Mullen MRN: [id number] Date of Birth: 11/21/1980

## 2020-12-07 ENCOUNTER — Ambulatory Visit (INDEPENDENT_AMBULATORY_CARE_PROVIDER_SITE_OTHER): Payer: BC Managed Care – PPO | Admitting: Internal Medicine

## 2020-12-07 ENCOUNTER — Encounter: Payer: BC Managed Care – PPO | Admitting: Physical Therapy

## 2020-12-07 ENCOUNTER — Encounter: Payer: Self-pay | Admitting: Internal Medicine

## 2020-12-07 ENCOUNTER — Other Ambulatory Visit: Payer: Self-pay

## 2020-12-07 VITALS — BP 123/79 | HR 85 | Temp 98.0°F | Resp 17 | Ht 70.0 in | Wt 241.0 lb

## 2020-12-07 DIAGNOSIS — E785 Hyperlipidemia, unspecified: Secondary | ICD-10-CM

## 2020-12-07 DIAGNOSIS — Z23 Encounter for immunization: Secondary | ICD-10-CM

## 2020-12-07 DIAGNOSIS — I639 Cerebral infarction, unspecified: Secondary | ICD-10-CM

## 2020-12-07 DIAGNOSIS — E1169 Type 2 diabetes mellitus with other specified complication: Secondary | ICD-10-CM | POA: Diagnosis not present

## 2020-12-07 DIAGNOSIS — I10 Essential (primary) hypertension: Secondary | ICD-10-CM

## 2020-12-07 DIAGNOSIS — F4323 Adjustment disorder with mixed anxiety and depressed mood: Secondary | ICD-10-CM

## 2020-12-07 DIAGNOSIS — E6609 Other obesity due to excess calories: Secondary | ICD-10-CM

## 2020-12-07 DIAGNOSIS — I213 ST elevation (STEMI) myocardial infarction of unspecified site: Secondary | ICD-10-CM

## 2020-12-07 DIAGNOSIS — E66811 Other obesity due to excess calories: Secondary | ICD-10-CM

## 2020-12-07 DIAGNOSIS — Z6834 Body mass index (BMI) 34.0-34.9, adult: Secondary | ICD-10-CM

## 2020-12-07 DIAGNOSIS — I2510 Atherosclerotic heart disease of native coronary artery without angina pectoris: Secondary | ICD-10-CM

## 2020-12-07 MED ORDER — BUPROPION HCL ER (XL) 150 MG PO TB24: 150.0000 mg | ORAL_TABLET | Freq: Every day | ORAL | 2 refills | Status: DC

## 2020-12-07 NOTE — Assessment & Plan Note (Signed)
Encouraged diet and exercise for weight loss ?

## 2020-12-07 NOTE — Assessment & Plan Note (Signed)
C-Met and lipid profile today Encouraged him to consume a low-fat diet advised him that if he does not, he is increasing his risk for recurrent stroke, heart attack and death  Continue Rosuvastatin

## 2020-12-07 NOTE — Patient Instructions (Signed)
Stroke Prevention Some medical conditions and lifestyle choices can lead to a higher risk for a stroke. You can help to prevent a stroke by eating healthy foods and exercising. It also helps to not smoke and to manage any health problems youmay have. How can this condition affect me? A stroke is an emergency. It should be treated right away. A stroke can lead to brain damage or threaten your life. There is a better chance of surviving andgetting better after a stroke if you get medical help right away. What can increase my risk? The following medical conditions may increase your risk of a stroke: Diseases of the heart and blood vessels (cardiovascular disease). High blood pressure (hypertension). Diabetes. High cholesterol. Sickle cell disease. Problems with blood clotting. Being very overweight. Sleeping problems (obstructivesleep apnea). Other risk factors include: Being older than age 60. A history of blood clots, stroke, or mini-stroke (TIA). Race, ethnic background, or a family history of stroke. Smoking or using tobacco products. Taking birth control pills, especially if you smoke. Heavy alcohol and drug use. Not being active. What actions can I take to prevent this? Manage your health conditions High cholesterol. Eat a healthy diet. If this is not enough to manage your cholesterol, you may need to take medicines. Take medicines as told by your doctor. High blood pressure. Try to keep your blood pressure below 130/80. If your blood pressure cannot be managed through a healthy diet and regular exercise, you may need to take medicines. Take medicines as told by your doctor. Ask your doctor if you should check your blood pressure at home. Have your blood pressure checked every year. Diabetes. Eat a healthy diet and get regular exercise. If your blood sugar (glucose) cannot be managed through diet and exercise, you may need to take medicines. Take medicines as told by your  doctor. Talk to your doctor about getting checked for sleeping problems. Signs of a problem can include: Snoring a lot. Feeling very tired. Make sure that you manage any other conditions you have. Nutrition  Follow instructions from your doctor about what to eat or drink. You may be told to: Eat and drink fewer calories each day. Limit how much salt (sodium) you use to 1,500 milligrams (mg) each day. Use only healthy fats for cooking, such as olive oil, canola oil, and sunflower oil. Eat healthy foods. To do this: Choose foods that are high in fiber. These include whole grains, and fresh fruits and vegetables. Eat at least 5 servings of fruits and vegetables a day. Try to fill one-half of your plate with fruits and vegetables at each meal. Choose low-fat (lean) proteins. These include low-fat cuts of meat, chicken without skin, fish, tofu, beans, and nuts. Eat low-fat dairy products. Avoid foods that: Are high in salt. Have saturated fat. Have trans fat. Have cholesterol. Are processed or pre-made. Count how many carbohydrates you eat and drink each day.  Lifestyle If you drink alcohol: Limit how much you have to: 0-1 drink a day for women who are not pregnant. 0-2 drinks a day for men. Know how much alcohol is in your drink. In the [place] one drink equals one 12 oz bottle of beer (355mL), one 5 oz glass of wine (148mL), or one 1 oz glass of hard liquor (44mL). Do not smoke or use any products that have nicotine or tobacco. If you need help quitting, ask your doctor. Avoid secondhand smoke. Do not use drugs. Activity  Try to stay at a healthy   weight. Get at least 30 minutes of exercise on most days, such as: Fast walking. Biking. Swimming.  Medicines Take over-the-counter and prescription medicines only as told by your doctor. Avoid taking birth control pills. Talk to your doctor about the risks of taking birth control pills if: You are over 35 years old. You smoke. You  get very bad headaches. You have had a blood clot. Where to find more information American Stroke Association: www.strokeassociation.org Get help right away if: You or a loved one has any signs of a stroke. "BE FAST" is an easy way to remember the warning signs: B - Balance. Dizziness, sudden trouble walking, or loss of balance. E - Eyes. Trouble seeing or a change in how you see. F - Face. Sudden weakness or loss of feeling of the face. The face or eyelid may droop on one side. A - Arms. Weakness or loss of feeling in an arm. This happens all of a sudden and most often on one side of the body. S - Speech. Sudden trouble speaking, slurred speech, or trouble understanding what people say. T - Time. Time to call emergency services. Write down what time symptoms started. You or a loved one has other signs of a stroke, such as: A sudden, very bad headache with no known cause. Feeling like you may vomit (nausea). Vomiting. A seizure. These symptoms may be an emergency. Get help right away. Call your local emergency services (911 in the [place] Do not wait to see if the symptoms will go away. Do not drive yourself to the hospital. Summary You can help to prevent a stroke by eating healthy, exercising, and not smoking. It also helps to manage any health problems you have. Do not smoke or use any products that contain nicotine or tobacco. Get help right away if you or a loved one has any signs of a stroke. This information is not intended to replace advice given to you by your health care provider. Make sure you discuss any questions you have with your healthcare provider. Document Revised: 09/02/2019 Document Reviewed: 09/02/2019 Elsevier Patient Education  2022 [employer]vier Inc.  

## 2020-12-07 NOTE — Assessment & Plan Note (Signed)
A1C today Encouraged him to consume a low carb diet and exercise for weight loss Continue Metformin and Lantus Encouraged routine eye exams Encouraged routine foot exams Flu shot today Pneumovax UTD He declines covid vaccine

## 2020-12-07 NOTE — Assessment & Plan Note (Signed)
C-Met and lipid profile today Encouraged him to consume a low-fat diet advised him that if he does not, he is increasing his risk for recurrent stroke, heart attack and death  Continue Rosuvastatin, ASA and Eliquis 

## 2020-12-07 NOTE — Progress Notes (Signed)
Subjective:    Patient ID: Martin Mullen, male    DOB: Jan 08, 1981, 40 y.o.   MRN: [id number]  HPI  Pt presents to the clinic today for follow up of chronic conditions.  HTN: His BP today is 123/79. He is not taking any antihypertensive medication at this time. ECG from 08/2020 reviewed.  HLD with CAD s/p STEMI: His last LDL was 125, triglycerides 179, 11/2020. He was switched from Atorvastatin to Rosuvastatin 08/2020. He denies myalgias. He does not consume a low fat diet.  DM 2: His last A1C was 9.4%, 08/2020. He is taking Metformin and Lantus as prescribed. He is not checking his sugars. He does not check his feet routinely. His last eye exam was 06/2019. Flu 10/2019. Pneumovax 06/2017. Covid never.  Recurrent Strokes: Recently seen in the ER 10/3 for weakness and altered mental status.  CT scan at that time was unchanged.  He has a new onset stutter and often repeating himself that started 2 weeks ago. He has fallen 2-3 times in the last week. His wife reports he has had difficulty swallowing, often choking on is food. He is not making progress with physical therapy. He is taking Atorvastatin, ASA and Eliquis as prescribed. He has been following with neurology and recently had an appt with a second opinion at [facility]. He has a CT/MRI scheduled 11/22. They also want him to wear a heart monitor. He has a follow up planned 02/17/21.  GERD: The is not taking any medications for this. There is no upper GI on file.  Anxiety and Depression: Triggered by his chronic health issues.  He is not currently working.  He reports he sits on the couch all day and does nothing.  He does not help with any of the chores around the home.  His wife reports he only showers about 1 time a week.  He is not taking any medications for this. He is not seeing a therapist. He denies SI/HI.  He has a history of substance abuse and per him and his wife's report, he has not relapsed at this time.  Review of Systems  Past Medical  History:  Diagnosis Date   Acute transmural inferior wall MI ([place]) 08/28/2012   .5 x 12 mm Veri-flex stent non-DES.   Chicken pox    Coronary artery disease    Inferior ST elevation myocardial infarction in July of 2014. Cardiac catheterization showed 95% distal RCA stenosis and 90% first diagonal stenosis with normal ejection fraction. He underwent PCI in bare-metal stent placement to the distal RCA.   Diabetes mellitus without complication (HCC)    Hypercholesterolemia    Stroke ([place])    Tobacco use     Current Outpatient Medications  Medication Sig Dispense Refill   apixaban (ELIQUIS) 5 MG TABS tablet Take 1 tablet (5 mg total) by mouth 2 (two) times daily. 180 tablet 0   aspirin 81 MG EC tablet Take 1 tablet (81 mg total) by mouth daily. 30 tablet 4   Blood Glucose Monitoring Suppl (BAYER CONTOUR NEXT MONITOR) w/Device KIT 1 Device 1 kit 0   CONTOUR NEXT TEST test strip USE 1 STRIP TO CHECK GLUCOSE THREE TIMES DAILY AS NEEDED 100 each 0   insulin glargine (LANTUS SOLOSTAR) 100 UNIT/ML Solostar Pen ADMINISTER 23 UNITS UNDER THE SKIN EVERY DAY (Patient taking differently: Inject 30 Units into the skin daily. ADMINISTER 23 UNITS UNDER THE SKIN EVERY DAY) 15 mL 1   metFORMIN (GLUCOPHAGE) 1000 MG tablet TAKE  1 TABLET(1000 MG) BY MOUTH TWICE DAILY WITH A MEAL 180 tablet 0   Multiple Vitamin (MULTIVITAMIN WITH MINERALS) TABS tablet Take 1 tablet by mouth in the morning. One-A-Day Multivitamin     nitroGLYCERIN (NITROSTAT) 0.4 MG SL tablet DISSOLVE ONE TABLET UNDER THE TONGUE EVERY 5 MINUTES AS NEEDED FOR CHEST PAIN.  DO NOT EXCEED A TOTAL OF 3 DOSES IN 15 MINUTES 25 tablet 2   Omega-3 Fatty Acids (FISH OIL) 1200 MG CAPS Take 1,200 mg by mouth in the morning and at bedtime.     rosuvastatin (CRESTOR) 10 MG tablet Take 1 tablet (10 mg total) by mouth daily. 30 tablet 2   lisinopril (ZESTRIL) 5 MG tablet Take 5 mg by mouth daily.     No current facility-administered medications for this visit.     No Known Allergies  Family History  Problem Relation Age of Onset   Arthritis Mother        Rheumatoid   Diabetes Mother    Hyperlipidemia Mother    Diabetes Father    Cancer Maternal Grandfather        Prostate   Parkinson's disease Maternal Grandfather     Social History   Socioeconomic History   Marital status: Divorced    Spouse name: Not on file   Number of children: 2   Years of education: Not on file   Highest education level: Not on file  Occupational History   Not on file  Tobacco Use   Smoking status: Every Day    Packs/day: 1.00    Years: 30.00    Pack years: 30.00    Types: Cigarettes   Smokeless tobacco: Never  Vaping Use   Vaping Use: Never used  Substance and Sexual Activity   Alcohol use: Not Currently    Alcohol/week: 0.0 standard drinks    Comment: occasional   Drug use: Not Currently    Types: Marijuana   Sexual activity: Yes    Birth control/protection: None  Other Topics Concern   Not on file  Social History Narrative   Lives with girlfriend and her mother    Social Determinants of Health   Financial Resource Strain: Not on file  Food Insecurity: Not on file  Transportation Needs: Not on file  Physical Activity: Not on file  Stress: Not on file  Social Connections: Not on file  Intimate Partner Violence: Not on file     Constitutional: Patient reports unintentional weight loss.  Denies fever, malaise, fatigue, headache.  HEENT: Denies eye pain, eye redness, ear pain, ringing in the ears, wax buildup, runny nose, nasal congestion, bloody nose, or sore throat. Respiratory: Denies difficulty breathing, shortness of breath, cough or sputum production.   Cardiovascular: Denies chest pain, chest tightness, palpitations or swelling in the hands or feet.  Gastrointestinal: Denies abdominal pain, bloating, constipation, diarrhea or blood in the stool.  GU: Denies urgency, frequency, pain with urination, burning sensation, blood in  urine, odor or discharge. Musculoskeletal: Pt reports left side weakness. Denies decrease in range of motion, difficulty with gait, muscle pain or joint pain and swelling.  Skin: Denies redness, rashes, lesions or ulcercations.  Neurological: Pt reports difficulty with memory, speech and balance. Denies dizziness, difficulty with coordination.  Psych: Pt has a history of anxiety and depression. Denies SI/HI.  No other specific complaints in a complete review of systems (except as listed in HPI above).     Objective:   BP 123/79 (BP Location: Right Arm, Patient Position: Sitting,  Cuff Size: Large)   Pulse 85   Temp 98 F (36.7 C) (Temporal)   Resp 17   Ht _0  (1.778 m)   Wt 241 lb (109.3 kg)   SpO2 100%   BMI 34.58 kg/m   Wt Readings from Last 3 Encounters:  11/16/20 255 lb (115.7 kg)  10/07/20 249 lb 6.4 oz (113.1 kg)  09/23/20 247 lb 11.2 oz (112.4 kg)    General: Appears his stated age, obese, chronically ill-appearing, in NAD. Skin: Warm, dry and intact.  Multiple scabbed lesions noted of bilateral lower extremities. HEENT: Head: normal shape and size; Eyes: sclera white, PERRLA and EOMs intact;  Neck:  Neck supple, trachea midline. No masses, lumps or thyromegaly present.  Cardiovascular: Normal rate and rhythm. S1,S2 noted.  No murmur, rubs or gallops noted. No JVD or BLE edema. No carotid bruits noted. Pulmonary/Chest: Normal effort and positive vesicular breath sounds. No respiratory distress. No wheezes, rales or ronchi noted.  Musculoskeletal: Gait slow and unsteady without device.  He is unable to tandem walk.  He is unable to stand on tiptoes and heels without assistance.  Strength 5/5 BUE.  Strength 4/5 LLE, 5/5 RLE.  Neurological: Alert and oriented.  Stuttering with conversation.  Coordination slowed. Psychiatric: Mood and affect flat. Behavior is normal. Judgment and thought content normal.    BMET    Component Value Date/Time   NA 134 (L) 11/16/2020 1623    NA 140 11/13/2019 1550   NA 139 01/29/2013 0040   K 4.7 11/16/2020 1623   K 4.0 01/29/2013 0040   CL 100 11/16/2020 1623   CL 107 01/29/2013 0040   CO2 26 11/16/2020 1623   CO2 28 01/29/2013 0040   GLUCOSE 274 (H) 11/16/2020 1623   GLUCOSE 228 (H) 01/29/2013 0040   BUN 19 11/16/2020 1623   BUN 10 11/13/2019 1550   BUN 15 01/29/2013 0040   CREATININE 0.96 11/16/2020 1623   CREATININE 1.00 09/02/2020 1100   CALCIUM 9.2 11/16/2020 1623   CALCIUM 9.2 01/29/2013 0040   GFRNONAA >60 11/16/2020 1623   GFRNONAA >60 01/29/2013 0040   GFRAA 104 11/13/2019 1550   GFRAA >60 01/29/2013 0040    Lipid Panel     Component Value Date/Time   CHOL 187 08/27/2020 0720   CHOL 133 11/13/2019 1550   TRIG 179 (H) 08/27/2020 0720   HDL 26 (L) 08/27/2020 0720   HDL 24 (L) 11/13/2019 1550   CHOLHDL 7.2 08/27/2020 0720   VLDL 36 08/27/2020 0720   LDLCALC 125 (H) 08/27/2020 0720   LDLCALC 105 (H) 07/01/2020 0824    CBC    Component Value Date/Time   WBC 13.3 (H) 11/16/2020 1623   RBC 4.96 11/16/2020 1623   HGB 16.3 11/16/2020 1623   HGB 16.9 11/13/2019 1550   HCT 45.2 11/16/2020 1623   HCT 49.9 11/13/2019 1550   PLT 213 11/16/2020 1623   PLT 271 11/13/2019 1550   MCV 91.1 11/16/2020 1623   MCV 93 11/13/2019 1550   MCV 93 01/29/2013 0040   MCH 32.9 11/16/2020 1623   MCHC 36.1 (H) 11/16/2020 1623   RDW 12.8 11/16/2020 1623   RDW 13.6 11/13/2019 1550   RDW 13.8 01/29/2013 0040   LYMPHSABS 4.0 08/26/2020 1647   MONOABS 0.9 08/26/2020 1647   EOSABS 0.3 08/26/2020 1647   BASOSABS 0.1 08/26/2020 1647    Hgb A1C Lab Results  Component Value Date   HGBA1C 9.4 (H) 08/27/2020  Assessment & Plan:    Webb Silversmith, NP This visit occurred during the SARS-CoV-2 public health emergency.  Safety protocols were in place, including screening questions prior to the visit, additional usage of staff PPE, and extensive cleaning of exam room while observing appropriate contact time  as indicated for disinfecting solutions.

## 2020-12-07 NOTE — Assessment & Plan Note (Signed)
Deteriorated He denies relapse of substance abuse Depression is likely contributing He is not making any progress with PT at this time- have discussed this was Riki Rusk PT Continue Rosuvastatin, ASA and Eliquis Advised his wife to cause [facility] and move up his follow up appt

## 2020-12-07 NOTE — Assessment & Plan Note (Signed)
I wonder if some of his issues are psychosomatic Will restart Wellbutrin 150 mg XL He declines referral for therapy at this time  Support offered

## 2020-12-07 NOTE — Assessment & Plan Note (Signed)
Controlled off meds Reinforced DASH diet and exercise for weight loss C-Met today 

## 2020-12-08 ENCOUNTER — Encounter: Payer: BC Managed Care – PPO | Admitting: Physical Therapy

## 2020-12-08 ENCOUNTER — Telehealth: Payer: Self-pay

## 2020-12-08 LAB — COMPLETE METABOLIC PANEL WITH GFR
AG Ratio: 1.5 (calc) (ref 1.0–2.5)
ALT: 24 U/L (ref 9–46)
AST: 14 U/L (ref 10–40)
Albumin: 4 g/dL (ref 3.6–5.1)
Alkaline phosphatase (APISO): 106 U/L (ref 36–130)
BUN: 16 mg/dL (ref 7–25)
CO2: 24 mmol/L (ref 20–32)
Calcium: 9.6 mg/dL (ref 8.6–10.3)
Chloride: 103 mmol/L (ref 98–110)
Creat: 0.91 mg/dL (ref 0.60–1.29)
Globulin: 2.7 g/dL (calc) (ref 1.9–3.7)
Glucose, Bld: 251 mg/dL — ABNORMAL HIGH (ref 65–99)
Potassium: 4.4 mmol/L (ref 3.5–5.3)
Sodium: 137 mmol/L (ref 135–146)
Total Bilirubin: 0.5 mg/dL (ref 0.2–1.2)
Total Protein: 6.7 g/dL (ref 6.1–8.1)
eGFR: 109 mL/min/{1.73_m2} (ref >=60)

## 2020-12-08 LAB — HEMOGLOBIN A1C
Hgb A1c MFr Bld: 9.2 % of total Hgb — ABNORMAL HIGH (ref <5.7)
Mean Plasma Glucose: 217 mg/dL
eAG (mmol/L): 12 mmol/L

## 2020-12-08 LAB — LIPID PANEL
Cholesterol: 102 mg/dL (ref <200)
HDL: 27 mg/dL — ABNORMAL LOW (ref >=40)
LDL Cholesterol (Calc): 53 mg/dL (calc)
Non-HDL Cholesterol (Calc): 75 mg/dL (calc) (ref <130)
Total CHOL/HDL Ratio: 3.8 (calc) (ref <5.0)
Triglycerides: 138 mg/dL (ref <150)

## 2020-12-08 LAB — TSH: TSH: 1.44 mIU/L (ref 0.40–4.50)

## 2020-12-08 NOTE — Telephone Encounter (Signed)
Received long term disability claim form for this patient. Hematology Diagnosis not appropriate for LTD. Fax sent back to [employer] to inform the of this and to reach out to PCP for form completion.

## 2020-12-09 ENCOUNTER — Encounter: Payer: BC Managed Care – PPO | Admitting: Physical Therapy

## 2020-12-10 ENCOUNTER — Encounter: Payer: BC Managed Care – PPO | Admitting: Physical Therapy

## 2020-12-11 ENCOUNTER — Ambulatory Visit: Payer: BC Managed Care – PPO | Admitting: Dietician

## 2020-12-16 ENCOUNTER — Other Ambulatory Visit: Payer: Self-pay

## 2020-12-16 ENCOUNTER — Emergency Department
Admission: EM | Admit: 2020-12-16 | Discharge: 2020-12-16 | Disposition: A | Discharge status: Home / Self Care | Payer: BC Managed Care – PPO | Attending: Emergency Medicine | Admitting: Emergency Medicine

## 2020-12-16 DIAGNOSIS — L0291 Cutaneous abscess, unspecified: Secondary | ICD-10-CM

## 2020-12-16 DIAGNOSIS — F17210 Nicotine dependence, cigarettes, uncomplicated: Secondary | ICD-10-CM | POA: Diagnosis not present

## 2020-12-16 DIAGNOSIS — I2510 Atherosclerotic heart disease of native coronary artery without angina pectoris: Secondary | ICD-10-CM | POA: Insufficient documentation

## 2020-12-16 DIAGNOSIS — Z79899 Other long term (current) drug therapy: Secondary | ICD-10-CM | POA: Insufficient documentation

## 2020-12-16 DIAGNOSIS — Z7984 Long term (current) use of oral hypoglycemic drugs: Secondary | ICD-10-CM | POA: Insufficient documentation

## 2020-12-16 DIAGNOSIS — I10 Essential (primary) hypertension: Secondary | ICD-10-CM | POA: Diagnosis not present

## 2020-12-16 DIAGNOSIS — E119 Type 2 diabetes mellitus without complications: Secondary | ICD-10-CM | POA: Diagnosis not present

## 2020-12-16 DIAGNOSIS — Z7982 Long term (current) use of aspirin: Secondary | ICD-10-CM | POA: Diagnosis not present

## 2020-12-16 DIAGNOSIS — Z7901 Long term (current) use of anticoagulants: Secondary | ICD-10-CM | POA: Diagnosis not present

## 2020-12-16 DIAGNOSIS — Z794 Long term (current) use of insulin: Secondary | ICD-10-CM | POA: Insufficient documentation

## 2020-12-16 DIAGNOSIS — N492 Inflammatory disorders of scrotum: Secondary | ICD-10-CM | POA: Diagnosis not present

## 2020-12-16 LAB — CBG MONITORING, ED: Glucose-Capillary: 232 mg/dL — ABNORMAL HIGH (ref 70–99)

## 2020-12-16 MED ORDER — AMOXICILLIN-POT CLAVULANATE 875-125 MG PO TABS: 1.0000 | ORAL_TABLET | Freq: Two times a day (BID) | ORAL | 0 refills | Status: AC

## 2020-12-16 NOTE — ED Triage Notes (Signed)
Pt states he has an "open wound on my scrotum" for the past 2 days.

## 2020-12-16 NOTE — Discharge Instructions (Addendum)
Keep the area clean and dry.  Take the Augmentin 1 pill twice a day with food.  Sometimes people who take it without food get diarrhea.  If you get diarrhea that is very bad stop it and come back let us know.  Have your doctor check on the area in the next 3 to 4 days to make sure it is improving.  If it feels like it is getting worse, if it gets more painful or more swollen or red or or begins draining a lot of pus please come back.  Occasionally we will have to change the antibiotic to something different.

## 2020-12-16 NOTE — ED Provider Notes (Addendum)
[facility] Emergency Department Provider Note  = ____________________________________________   Event Date/Time   First MD Initiated Contact with Patient 12/16/20 1059     (approximate)  I have reviewed the triage vital signs and the nursing notes.   HISTORY  Chief Complaint Abscess    HPI Martin Mullen is a 40 y.o. male who reports he has had an open draining wound on his scrotum for about 2 days.  He is not sure how it got there.  There is about 1/4 inch area in the scrotum which is open and about three quarters of an inch of surrounding firmness and slight erythema.  The area is somewhat tender as well.         Past Medical History:  Diagnosis Date   Acute transmural inferior wall MI ([place]) 08/28/2012   .5 x 12 mm Veri-flex stent non-DES.   Chicken pox    Coronary artery disease    Inferior ST elevation myocardial infarction in July of 2014. Cardiac catheterization showed 95% distal RCA stenosis and 90% first diagonal stenosis with normal ejection fraction. He underwent PCI in bare-metal stent placement to the distal RCA.   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/2020   GERD (gastroesophageal reflux disease) 07/20/2019   Coronary artery disease    Type 2 diabetes mellitus with hyperlipidemia ([place]) 07/09/2013   STEMI s/p RCA stent 08/2012 09/05/2012   HTN (hypertension) 09/05/2012   HLD (hyperlipidemia) 09/05/2012   Adjustment disorder with mixed anxiety and depressed mood 09/05/2012    Past Surgical History:  Procedure Laterality Date   CORONARY ANGIOPLASTY WITH STENT PLACEMENT     DEBRIDEMENT AND CLOSURE WOUND N/A  09/03/2019   Procedure: Debridement and closure of scrotal wound;  Surgeon: Cindra Presume, MD;  Location: [place];  Service: Plastics;  Laterality: N/A;   DEBRIDEMENT AND CLOSURE WOUND N/A 07/25/2019   Procedure: DEBRIDEMENT AND CLOSURE WOUND;  Surgeon: Cindra Presume, MD;  Location: [facility];  Service: Plastics;  Laterality: N/A;   INCISION AND DRAINAGE ABSCESS N/A 07/20/2019   Procedure: INCISION AND DRAINAGE ABSCESS;  Surgeon: Lucas Mallow, MD;  Location: [facility];  Service: Urology;  Laterality: N/A;   LEFT HEART CATHETERIZATION WITH CORONARY ANGIOGRAM N/A 08/28/2012   Procedure: LEFT HEART CATHETERIZATION WITH CORONARY ANGIOGRAM;  Surgeon: Laverda Page, MD;  Location: [facility] CATH LAB;  Service: Cardiovascular;  Laterality: N/A;   TEE WITHOUT CARDIOVERSION N/A 09/23/2020   Procedure: TRANSESOPHAGEAL ECHOCARDIOGRAM (TEE);  Surgeon: Corey Skains, MD;  Location: [facility] ORS;  Service: Cardiovascular;  Laterality: N/A;   [place]    Prior to Admission medications   Medication Sig Start Date End Date Taking? Authorizing Provider  amoxicillin-clavulanate (AUGMENTIN) 875-125 MG tablet Take 1 tablet by mouth 2 (two) times daily for 10 days. 12/16/20 12/26/20 Yes Nena Polio, MD  apixaban (ELIQUIS) 5 MG TABS tablet Take 1 tablet (5 mg total) by mouth 2 (two) times daily. 11/17/20   [name], NP  aspirin 81 MG EC tablet Take 1 tablet (81 mg total) by mouth daily. 09/05/12  Rai, Vernelle Emerald, MD  Blood Glucose Monitoring Suppl (BAYER CONTOUR NEXT MONITOR) w/Device KIT 1 Device 07/22/15   [name], NP  buPROPion (WELLBUTRIN XL) 150 MG 24 hr tablet Take 1 tablet (150 mg total) by mouth daily. 12/07/20   [name], NP  CONTOUR NEXT TEST test strip USE 1 STRIP TO CHECK GLUCOSE THREE TIMES DAILY AS NEEDED 09/05/18   [name], NP  insulin glargine (LANTUS SOLOSTAR) 100 UNIT/ML Solostar Pen ADMINISTER 23 UNITS UNDER THE SKIN EVERY DAY Patient taking  differently: Inject 30 Units into the skin daily. ADMINISTER 23 UNITS UNDER THE SKIN EVERY DAY 09/29/20   [name], NP  lisinopril (ZESTRIL) 5 MG tablet Take 5 mg by mouth daily. 10/08/20   [provider]  metFORMIN (GLUCOPHAGE) 1000 MG tablet TAKE 1 TABLET(1000 MG) BY MOUTH TWICE DAILY WITH A MEAL 11/09/20   [name], NP  Multiple Vitamin (MULTIVITAMIN WITH MINERALS) TABS tablet Take 1 tablet by mouth in the morning. One-A-Day Multivitamin    [provider]  nitroGLYCERIN (NITROSTAT) 0.4 MG SL tablet DISSOLVE ONE TABLET UNDER THE TONGUE EVERY 5 MINUTES AS NEEDED FOR CHEST PAIN.  DO NOT EXCEED A TOTAL OF 3 DOSES IN 15 MINUTES 11/13/19   [name], NP  Omega-3 Fatty Acids (FISH OIL) 1200 MG CAPS Take 1,200 mg by mouth in the morning and at bedtime.    [provider]  rosuvastatin (CRESTOR) 10 MG tablet Take 1 tablet (10 mg total) by mouth daily. 10/07/20   [name], NP    Allergies Patient has no known allergies.  Family History  Problem Relation Age of Onset   Arthritis Mother        Rheumatoid   Diabetes Mother    Hyperlipidemia Mother    Diabetes Father    Cancer Maternal Grandfather        Prostate   Parkinson's disease Maternal Grandfather     Social History Social History   Tobacco Use   Smoking status: Every Day    Packs/day: 1.00    Years: 30.00    Pack years: 30.00    Types: Cigarettes   Smokeless tobacco: Never  Vaping Use   Vaping Use: Never used  Substance Use Topics   Alcohol use: Not Currently    Alcohol/week: 0.0 standard drinks    Comment: occasional   Drug use: Not Currently    Types: Marijuana    Review of Systems  Constitutional: No fever/chills Eyes: No visual changes. ENT: No sore throat. Cardiovascular: Denies chest pain. Respiratory: Denies shortness of breath. Gastrointestinal: No abdominal pain.  No nausea, no vomiting.  No diarrhea.  No constipation. Genitourinary: Negative for  dysuria. Musculoskeletal: Negative for back pain. Skin: Negative for rash. Neurological: Negative for headaches, focal weakness    ____________________________________________   PHYSICAL EXAM:  VITAL SIGNS: ED Triage Vitals  Enc Vitals Group     BP 12/16/20 0949 116/83     Pulse Rate 12/16/20 0949 (!) 104     Resp 12/16/20 0949 17     Temp 12/16/20 0951 98.2 F (36.8 C)     Temp src --      SpO2 12/16/20 0949 97 %     Weight 12/16/20 0950 240 lb (108.9 kg)     Height 12/16/20 0950 5' 10" (1.778 m)     Head Circumference --      Peak Flow --      Pain Score 12/16/20 0949  0     Pain Loc --      Pain Edu? --      Excl. in [place]? --     Constitutional: Alert and oriented. Well appearing and in no acute distress. Eyes: Conjunctivae are normal.  Head: Atraumatic. Nose: No congestion/rhinnorhea. Mouth/Throat: Mucous membranes are moist.   Neck: No stridor.  Cardiovascular: Normal rate, regular rhythm. Grossly normal heart sounds.  Good peripheral circulation. Respiratory: Normal respiratory effort.  No retractions. Lungs CTAB. Gastrointestinal: Soft and nontender. No distention. No abdominal bruits.  Genitourinary: Normal penis.  Scrotum is normal except for area as described in HPI. usculoskeletal: No lower extremity tenderness nor edema.   Neurologic:  Normal speech and language. No gross focal neurologic deficits are appreciated. No gait instability. Skin:  Skin is warm, dry and intact, No rash noted all normal except as noted in HPI  ____________________________________________   LABS (all labs ordered are listed, but only abnormal results are displayed)  Labs Reviewed  CBG MONITORING, ED - Abnormal; Notable for the following components:      Result Value   Glucose-Capillary 232 (*)    All other components within normal limits   ____________________________________________  EKG   ____________________________________________  RADIOLOGY Gertha Calkin,  personally viewed and evaluated these images (plain radiographs) as part of my medical decision making, as well as reviewing the written report by the radiologist.  ED MD interpretation:    Official radiology report(s): No results found.  ____________________________________________   PROCEDURES  Procedure(s) performed (including Critical Care): Bedside ultrasound done.  Do not see any free abscess fluid present.  Do not see any tiny gas bubbles either.  Procedures   ____________________________________________   INITIAL IMPRESSION / ASSESSMENT AND PLAN / ED COURSE  This appears to be a abscess that drained spontaneously or small wound with infection.  Because it is on the scrotum I will give him Augmentin twice a day with food.  I will have him return if he is worse or not any better in a couple days or follow-up with his doctor.    ----------------------------------------- 11:33 AM on 12/16/2020 ----------------------------------------- Patient's blood sugar is okay.  Slightly elevated.  Patient's heart rate when I took it seem to be about 88.  Patient looks well and is in no acute distress at all.         ____________________________________________   FINAL CLINICAL IMPRESSION(S) / ED DIAGNOSES  Final diagnoses:  Abscess     ED Discharge Orders          Ordered    amoxicillin-clavulanate (AUGMENTIN) 875-125 MG tablet  2 times daily        12/16/20 1127             Note:  This document was prepared using Dragon voice recognition software and may include unintentional dictation errors.    Nena Polio, MD 12/16/20 1132    Nena Polio, MD 12/16/20 1134

## 2020-12-23 ENCOUNTER — Inpatient Hospital Stay (HOSPITAL_BASED_OUTPATIENT_CLINIC_OR_DEPARTMENT_OTHER): Payer: Self-pay | Admitting: Oncology

## 2020-12-23 ENCOUNTER — Inpatient Hospital Stay: Payer: Self-pay | Attending: Oncology

## 2020-12-23 ENCOUNTER — Encounter: Payer: Self-pay | Admitting: Oncology

## 2020-12-23 ENCOUNTER — Other Ambulatory Visit: Payer: Self-pay

## 2020-12-23 VITALS — BP 121/87 | HR 89 | Temp 96.5°F | Wt 236.0 lb

## 2020-12-23 DIAGNOSIS — Z7901 Long term (current) use of anticoagulants: Secondary | ICD-10-CM | POA: Insufficient documentation

## 2020-12-23 DIAGNOSIS — Z720 Tobacco use: Secondary | ICD-10-CM

## 2020-12-23 DIAGNOSIS — E1165 Type 2 diabetes mellitus with hyperglycemia: Secondary | ICD-10-CM | POA: Insufficient documentation

## 2020-12-23 DIAGNOSIS — I10 Essential (primary) hypertension: Secondary | ICD-10-CM | POA: Insufficient documentation

## 2020-12-23 DIAGNOSIS — Z7984 Long term (current) use of oral hypoglycemic drugs: Secondary | ICD-10-CM | POA: Insufficient documentation

## 2020-12-23 DIAGNOSIS — Z7982 Long term (current) use of aspirin: Secondary | ICD-10-CM | POA: Insufficient documentation

## 2020-12-23 DIAGNOSIS — Z79899 Other long term (current) drug therapy: Secondary | ICD-10-CM | POA: Insufficient documentation

## 2020-12-23 DIAGNOSIS — Z8673 Personal history of transient ischemic attack (TIA), and cerebral infarction without residual deficits: Secondary | ICD-10-CM | POA: Insufficient documentation

## 2020-12-23 DIAGNOSIS — I639 Cerebral infarction, unspecified: Secondary | ICD-10-CM

## 2020-12-23 DIAGNOSIS — D6852 Prothrombin gene mutation: Secondary | ICD-10-CM | POA: Insufficient documentation

## 2020-12-23 DIAGNOSIS — F17210 Nicotine dependence, cigarettes, uncomplicated: Secondary | ICD-10-CM | POA: Insufficient documentation

## 2020-12-23 DIAGNOSIS — I252 Old myocardial infarction: Secondary | ICD-10-CM | POA: Insufficient documentation

## 2020-12-23 LAB — CBC WITH DIFFERENTIAL/PLATELET
Abs Immature Granulocytes: 0.02 10*3/uL (ref 0.00–0.07)
Basophils Absolute: 0.1 10*3/uL (ref 0.0–0.1)
Basophils Relative: 1 %
Eosinophils Absolute: 0.6 10*3/uL — ABNORMAL HIGH (ref 0.0–0.5)
Eosinophils Relative: 5 %
HCT: 45.6 % (ref 39.0–52.0)
Hemoglobin: 16.1 g/dL (ref 13.0–17.0)
Immature Granulocytes: 0 %
Lymphocytes Relative: 36 %
Lymphs Abs: 4.3 10*3/uL — ABNORMAL HIGH (ref 0.7–4.0)
MCH: 31.9 pg (ref 26.0–34.0)
MCHC: 35.3 g/dL (ref 30.0–36.0)
MCV: 90.5 fL (ref 80.0–100.0)
Monocytes Absolute: 0.6 10*3/uL (ref 0.1–1.0)
Monocytes Relative: 5 %
Neutro Abs: 6.4 10*3/uL (ref 1.7–7.7)
Neutrophils Relative %: 53 %
Platelets: 259 10*3/uL (ref 150–400)
RBC: 5.04 MIL/uL (ref 4.22–5.81)
RDW: 12.6 % (ref 11.5–15.5)
WBC: 12.1 10*3/uL — ABNORMAL HIGH (ref 4.0–10.5)
nRBC: 0 % (ref 0.0–0.2)

## 2020-12-23 LAB — COMPREHENSIVE METABOLIC PANEL
ALT: 29 U/L (ref 0–44)
AST: 19 U/L (ref 15–41)
Albumin: 4 g/dL (ref 3.5–5.0)
Alkaline Phosphatase: 105 U/L (ref 38–126)
Anion gap: 9 (ref 5–15)
BUN: 10 mg/dL (ref 6–20)
CO2: 27 mmol/L (ref 22–32)
Calcium: 9.3 mg/dL (ref 8.9–10.3)
Chloride: 102 mmol/L (ref 98–111)
Creatinine, Ser: 0.88 mg/dL (ref 0.61–1.24)
GFR, Estimated: >60 mL/min (ref >60)
Glucose, Bld: 145 mg/dL — ABNORMAL HIGH (ref 70–99)
Potassium: 4.4 mmol/L (ref 3.5–5.1)
Sodium: 138 mmol/L (ref 135–145)
Total Bilirubin: 0.5 mg/dL (ref 0.3–1.2)
Total Protein: 7.3 g/dL (ref 6.5–8.1)

## 2020-12-23 NOTE — Progress Notes (Signed)
Patient here for oncology follow-up appointment, expresses no complaints or concerns at this time.    

## 2020-12-23 NOTE — Progress Notes (Signed)
Hematology/Oncology Progress note [facility] [phone] [phone]   Patient Care Team: [name], NP as PCP - General (Internal Medicine) Pa, [facility]  REFERRING PROVIDER: [name], NP  CHIEF COMPLAINTS/REASON FOR VISIT:  Recurrent Stroke, heterozygous prothrombin gene mutation.  HISTORY OF PRESENTING ILLNESS:   Martin Mullen is a  40 y.o.  male with PMH listed below was seen in consultation at the request of  [name], NP  for evaluation of prothrombin gene mutation  November 2021, patient had stroke involving left thalamus and right thalamus in the right frontal white matter.  Patient has risk factors including diabetes, hypertension, hyperlipidemia, history of MI, history of substance use, smoking.  MRI Brain 12/21/2019 - 1. Acute infarct in the left thalamus. Small acute infarct right thalamus and right inferior frontal white matter. 2. 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 3. No intracranial large vessel occlusion. There is atherosclerotic disease in the superior cerebellar artery bilaterally.   CT Angio of head and neck: 12/22/2019 - 1. No intracranial large vessel occlusion or proximal high-grade arterial stenosis. 2. Atherosclerotic plaque within both intracranial internal carotid arteries. Up to moderate stenosis within the distal cavernous/paraclinoid left ICA. No more than mild stenosis of the intracranial right ICA. 3. Atherosclerotic irregularity of the distal right PCA. 4. 1-2 mm vascular protrusion arising from the paraclinoid right ICA, which may reflect an infundibulum or small aneurysm.   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  Slight increase in anticardiolipin IgM-14-[does not meet  antiphospholipid syndrome diagnostic criteria]., negative lupus anticoagulant  08/26/2020- 08/28/2020 He was admitted due to [place] 19 infection and recurrent stroke 08/26/2020 CT head showed bilateral basal ganglia lacunar type infarcts, acute or subacute.  Possible small white matter infarct near the frontal horn of the right lateral ventricle. 08/26/2020, MRI brain without contrast showed 1.2 cm acute ischemic no acute hemorrhagic right basal ganglia/internal capsule infarct.  Additional 4 mm acute ischemic nonhemorrhagic cortical infarct involving the posterior left frontal cortex.  Few additional vague subcentimeter foci of diffusion abnormality involving the left thalamocapsular region, likely subacute ischemic changes. Underlying remote lacunar infarcts involving the right corona radiata, bilateral basal ganglia, bilateral thalami, and pons/midbrain.   Ultrasound carotid artery showed no evidence of focal significant stenosis bilaterally. MR angiogram head showed no intracranial large vessel occlusion.  Moderate stenosis left cavernous carotid. Echocardiogram showed LVEF 40-45%.  No embolus   INTERVAL HISTORY Martin Mullen is a 40 y.o. male who has above history reviewed by me today presents for follow up visit for management of Recurrent Stroke, heterozygous prothrombin gene mutation. August 2022 transesophageal echocardiogram showed an EF of 60 to 65%. There was no intracardiac thrombus or PFO Patient has switched his neurology care to [facility] Dr. Eldridge Scot who recommends 30-day event monitor of heart rhythm for possible paroxysmal A. fib.  If no A. fib is documented, patient will be recommended for loop monitor recorder placement.  Neurology recommend continue Eliquis 5 mg twice daily along with aspirin 81 mg daily. Also patient was recommended to have a CT angiogram of the head and neck to rule out possibility of vasculitis and evaluation of aneurysm.  Patient is on Eliquis 5 mg twice daily  with aspirin.  He denies any bleeding events.  Tolerates well.  Review of Systems  Constitutional:  Negative for appetite change, chills, fatigue, fever and unexpected weight change.  HENT:   Negative for hearing loss and voice change.   Eyes:  Negative for eye problems and icterus.  Respiratory:  Negative for chest tightness, cough and shortness of breath.   Cardiovascular:  Negative for chest pain and leg swelling.  Gastrointestinal:  Negative for abdominal distention and abdominal pain.  Endocrine: Negative for hot flashes.  Genitourinary:  Negative for difficulty urinating, dysuria and frequency.   Musculoskeletal:  Negative for arthralgias.  Skin:  Negative for itching and rash.  Neurological:  Negative for light-headedness and numbness.  Hematological:  Negative for adenopathy. Does not bruise/bleed easily.  Psychiatric/Behavioral:  Negative for confusion.    MEDICAL HISTORY:  Past Medical History:  Diagnosis Date   Acute transmural inferior wall MI ([place]) 08/28/2012   .5 x 12 mm Veri-flex stent non-DES.   Chicken pox    Coronary artery disease    Inferior ST elevation myocardial infarction in July of 2014. Cardiac catheterization showed 95% distal RCA stenosis and 90% first diagonal stenosis with normal ejection fraction. He underwent PCI in bare-metal stent placement to the distal RCA.   Diabetes mellitus without complication ([place])    Hypercholesterolemia    Stroke ([place])    Tobacco use     SURGICAL HISTORY: Past Surgical History:  Procedure Laterality Date   CORONARY ANGIOPLASTY WITH STENT PLACEMENT     DEBRIDEMENT AND CLOSURE WOUND N/A 09/03/2019   Procedure: Debridement and closure of scrotal wound;  Surgeon: Cindra Presume, MD;  Location: [facility];  Service: Plastics;  Laterality: N/A;   DEBRIDEMENT AND CLOSURE WOUND N/A 07/25/2019   Procedure: DEBRIDEMENT AND CLOSURE WOUND;  Surgeon: Cindra Presume, MD;  Location: [facility];  Service: Plastics;   Laterality: N/A;   INCISION AND DRAINAGE ABSCESS N/A 07/20/2019   Procedure: INCISION AND DRAINAGE ABSCESS;  Surgeon: Lucas Mallow, MD;  Location: [facility];  Service: Urology;  Laterality: N/A;   LEFT HEART CATHETERIZATION WITH CORONARY ANGIOGRAM N/A 08/28/2012   Procedure: LEFT HEART CATHETERIZATION WITH CORONARY ANGIOGRAM;  Surgeon: Laverda Page, MD;  Location: [facility] CATH LAB;  Service: Cardiovascular;  Laterality: N/A;   TEE WITHOUT CARDIOVERSION N/A 09/23/2020   Procedure: TRANSESOPHAGEAL ECHOCARDIOGRAM (TEE);  Surgeon: Corey Skains, MD;  Location: [facility];  Service: Cardiovascular;  Laterality: N/A;   WRIST SURGERY  1988    SOCIAL HISTORY: Social History   Socioeconomic History   Marital status: Divorced    Spouse name: Not on file   Number of children: 2   Years of education: Not on file   Highest education level: Not on file  Occupational History   Not on file  Tobacco Use   Smoking status: Every Day    Packs/day: 1.00    Years: 30.00    Pack years: 30.00    Types: Cigarettes   Smokeless tobacco: Never  Vaping Use   Vaping Use: Never used  Substance and Sexual Activity   Alcohol use: Not Currently    Alcohol/week: 0.0 standard drinks    Comment: occasional   Drug use: Not Currently    Types: Marijuana   Sexual activity: Yes    Birth control/protection: None  Other Topics Concern   Not on file  Social History Narrative   Lives with girlfriend and her mother    Social Determinants of Health   Financial Resource Strain: Not on file  Food Insecurity: Not on file  Transportation Needs: Not on file  Physical Activity: Not on file  Stress: Not on file  Social Connections: Not on file  Intimate Partner Violence: Not on file    FAMILY HISTORY: Family History  Problem Relation Age of Onset   Arthritis Mother        Rheumatoid   Diabetes Mother    Hyperlipidemia Mother    Diabetes Father    Cancer Maternal Grandfather        Prostate   Parkinson's  disease Maternal Grandfather     ALLERGIES:  has No Known Allergies.  MEDICATIONS:  Current Outpatient Medications  Medication Sig Dispense Refill   amoxicillin-clavulanate (AUGMENTIN) 875-125 MG tablet Take 1 tablet by mouth 2 (two) times daily for 10 days. 20 tablet 0   apixaban (ELIQUIS) 5 MG TABS tablet Take 1 tablet (5 mg total) by mouth 2 (two) times daily. 180 tablet 0   aspirin 81 MG EC tablet Take 1 tablet (81 mg total) by mouth daily. 30 tablet 4   Blood Glucose Monitoring Suppl (BAYER CONTOUR NEXT MONITOR) w/Device KIT 1 Device 1 kit 0   buPROPion (WELLBUTRIN XL) 150 MG 24 hr tablet Take 1 tablet (150 mg total) by mouth daily. 30 tablet 2   CONTOUR NEXT TEST test strip USE 1 STRIP TO CHECK GLUCOSE THREE TIMES DAILY AS NEEDED 100 each 0   insulin glargine (LANTUS SOLOSTAR) 100 UNIT/ML Solostar Pen ADMINISTER 23 UNITS UNDER THE SKIN EVERY DAY (Patient taking differently: Inject 30 Units into the skin daily. ADMINISTER 23 UNITS UNDER THE SKIN EVERY DAY) 15 mL 1   lisinopril (ZESTRIL) 5 MG tablet Take 5 mg by mouth daily.     metFORMIN (GLUCOPHAGE) 1000 MG tablet TAKE 1 TABLET(1000 MG) BY MOUTH TWICE DAILY WITH A MEAL 180 tablet 0   Multiple Vitamin (MULTIVITAMIN WITH MINERALS) TABS tablet Take 1 tablet by mouth in the morning. One-A-Day Multivitamin     Omega-3 Fatty Acids (FISH OIL) 1200 MG CAPS Take 1,200 mg by mouth in the morning and at bedtime.     rosuvastatin (CRESTOR) 10 MG tablet Take 1 tablet (10 mg total) by mouth daily. 30 tablet 2   nitroGLYCERIN (NITROSTAT) 0.4 MG SL tablet DISSOLVE ONE TABLET UNDER THE TONGUE EVERY 5 MINUTES AS NEEDED FOR CHEST PAIN.  DO NOT EXCEED A TOTAL OF 3 DOSES IN 15 MINUTES (Patient not taking: Reported on 12/23/2020) 25 tablet 2   No current facility-administered medications for this visit.     PHYSICAL EXAMINATION: ECOG PERFORMANCE STATUS: 0 - Asymptomatic Vitals:   12/23/20 1404  BP: 121/87  Pulse: 89  Temp: (!) 96.5 F (35.8 C)   SpO2: 98%   Filed Weights   12/23/20 1404  Weight: 236 lb (107 kg)    Physical Exam Constitutional:      General: He is not in acute distress. HENT:     Head: Normocephalic and atraumatic.  Eyes:     General: No scleral icterus. Cardiovascular:     Rate and Rhythm: Normal rate and regular rhythm.     Heart sounds: Normal heart sounds.  Pulmonary:     Effort: Pulmonary effort is normal. No respiratory distress.     Breath sounds: No wheezing.  Abdominal:     General: Bowel sounds are normal. There is no distension.     Palpations: Abdomen is soft.  Musculoskeletal:        General: No deformity. Normal range of motion.     Cervical back: Normal range  of motion and neck supple.  Skin:    General: Skin is warm and dry.     Findings: No erythema or rash.  Neurological:     Mental Status: He is alert and oriented to person, place, and time. Mental status is at baseline.     Cranial Nerves: No cranial nerve deficit.     Coordination: Coordination normal.  Psychiatric:        Mood and Affect: Mood normal.    LABORATORY DATA:  I have reviewed the data as listed Lab Results  Component Value Date   WBC 12.1 (H) 12/23/2020   HGB 16.1 12/23/2020   HCT 45.6 12/23/2020   MCV 90.5 12/23/2020   PLT 259 12/23/2020   Recent Labs    08/26/20 1647 08/28/20 0431 09/02/20 1100 11/16/20 1623 12/07/20 1037 12/23/20 1340  NA 134* 138   < > 134* 137 138  K 4.3 3.8   < > 4.7 4.4 4.4  CL 102 106   < > 100 103 102  CO2 25 26   < > _0 GLUCOSE 396* 170*   < > 274* 251* 145*  BUN 13 11   < > _1 CREATININE 0.92 0.77   < > 0.96 0.91 0.88  CALCIUM 9.1 8.7*   < > 9.2 9.6 9.3  GFRNONAA >60 >60  --  >60  --  >60  PROT 7.3  --   --  7.7 6.7 7.3  ALBUMIN 3.7  --   --  3.7  --  4.0  AST 29  --   --  _2 ALT 58*  --   --  32 24 29  ALKPHOS 102  --   --  123  --  105  BILITOT 0.6  --   --  0.8 0.5 0.5   < > = values in this interval not displayed.     Iron/TIBC/Ferritin/ %Sat No results found for: IRON, TIBC, FERRITIN, IRONPCTSAT    RADIOGRAPHIC STUDIES: I have personally reviewed the radiological images as listed and agreed with the findings in the report. No results found.    ASSESSMENT & PLAN:  1. 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 ([place])   2. Recurrent strokes ([place])   3. Tobacco abuse    # Recurrent stroke and Heterozygous prothrombin gene mutation Patient has prothrombin gene heterozygous mutation which by itself is not a major risk factor for arterial thrombosis.  Estimated venous thrombosis risk is about a 3-4 fold. Patient has multiple other risk factors including uncontrolled diabetes, history of MI, hypertension, smoking,COVID infection which all potentially contribute to his recurrent stroke. Currently he has not finished cardiac work-up for cryptogenic stroke. Patient tolerates Eliquis 5 mg twice daily very well and recommend patient to continue until he has had the work-up recommended by his neurologist.  Long-term plan is to decrease Eliquis to 2.5 mg twice daily plus aspirin 81 mg daily.  Tobacco use,  patient continues smokes.  Smoke cessation was discussed.  Orders Placed This Encounter  Procedures   CBC with Differential/Platelet    Standing Status:   Future    Standing Expiration Date:   12/23/2021   Comprehensive metabolic panel    Standing Status:   Future    Standing Expiration Date:   12/23/2021    All questions were answered. The patient knows to call the clinic with any problems questions or concerns.  cc [name], NP    Return  of visit:  3 months.  Earlie Server, MD, PhD [facility] Hematology Oncology 12/23/2020

## 2021-01-06 ENCOUNTER — Encounter: Payer: Self-pay | Admitting: Internal Medicine

## 2021-01-29 ENCOUNTER — Encounter: Payer: Self-pay | Admitting: Internal Medicine

## 2021-02-02 ENCOUNTER — Other Ambulatory Visit: Payer: Self-pay | Admitting: Internal Medicine

## 2021-02-02 NOTE — Telephone Encounter (Signed)
Requested Prescriptions  Pending Prescriptions Disp Refills   metFORMIN (GLUCOPHAGE) 1000 MG tablet [Pharmacy Med Name: METFORMIN 1000MG TABLETS] 180 tablet 0    Sig: TAKE 1 TABLET(1000 MG) BY MOUTH TWICE DAILY WITH A MEAL     Endocrinology:  Diabetes - Biguanides Failed - 02/02/2021  9:05 AM      Failed - HBA1C is between 0 and 7.9 and within 180 days    Hgb A1c MFr Bld  Date Value Ref Range Status  12/07/2020 9.2 (H) <5.7 % of total Hgb Final    Comment:    For someone without known diabetes, a hemoglobin A1c value of 6.5% or greater indicates that they may have  diabetes and this should be confirmed with a follow-up  test. . For someone with known diabetes, a value <7% indicates  that their diabetes is well controlled and a value  greater than or equal to 7% indicates suboptimal  control. A1c targets should be individualized based on  duration of diabetes, age, comorbid conditions, and  other considerations. . Currently, no consensus exists regarding use of hemoglobin A1c for diagnosis of diabetes for children. .          Passed - Cr in normal range and within 360 days    Creat  Date Value Ref Range Status  12/07/2020 0.91 0.60 - 1.29 mg/dL Final   Creatinine, Ser  Date Value Ref Range Status  12/23/2020 0.88 0.61 - 1.24 mg/dL Final   Creatinine,U  Date Value Ref Range Status  01/18/2016 120.7 mg/dL Final         Passed - eGFR in normal range and within 360 days    EGFR (African American)  Date Value Ref Range Status  01/29/2013 >60  Final   GFR calc Af Amer  Date Value Ref Range Status  11/13/2019 104 >59 mL/min/1.73 Final    Comment:    **[facility] currently reports eGFR in compliance with the current**   recommendations of the [employer]. [facility] will   update reporting as new guidelines are published from the NKF-ASN   Task force.    EGFR (Non-African Amer.)  Date Value Ref Range Status  01/29/2013 >60  Final    Comment:    eGFR  values <74m/min/1.73 m2 may be an indication of chronic kidney disease (CKD). Calculated eGFR is useful in patients with stable renal function. The eGFR calculation will not be reliable in acutely ill patients when serum creatinine is changing rapidly. It is not useful in  patients on dialysis. The eGFR calculation may not be applicable to patients at the low and high extremes of body sizes, pregnant women, and vegetarians.    GFR, Estimated  Date Value Ref Range Status  12/23/2020 >60 >60 mL/min Final    Comment:    (NOTE) Calculated using the CKD-EPI Creatinine Equation (2021)    GFR  Date Value Ref Range Status  04/02/2020 96.87 >60.00 mL/min Final    Comment:    Calculated using the CKD-EPI Creatinine Equation (2021)   eGFR  Date Value Ref Range Status  12/07/2020 109 > OR = 60 mL/min/1.755mFinal    Comment:    The eGFR is based on the CKD-EPI 2021 equation. To calculate  the new eGFR from a previous Creatinine or Cystatin C result, go to https://www.kidney.org/professionals/ kdoqi/gfr%5Fcalculator          Passed - Valid encounter within last 6 months    Recent Outpatient Visits  1 month ago Type 2 diabetes mellitus with hyperlipidemia [facility])   [facility] [place], [name], NP   3 months ago Left leg weakness   [facility] [place], [place], [place]   5 months ago Recurrent strokes [facility])   [facility], [name], NP   7 months ago Encounter for general adult medical examination with abnormal findings   [facility] [place], [name], NP   7 months ago Acute neck pain   [facility] [place], [name], [place]

## 2021-03-12 ENCOUNTER — Telehealth: Payer: Self-pay | Admitting: *Deleted

## 2021-03-12 NOTE — Telephone Encounter (Signed)
Received a long term disability form from [employer] to attention: Dr. Cathie Hoops.  Dr. Cathie Hoops, I reviewed the patient's chart. Pt is scheduled to see you again in March.  PCP has completed paperwork for fmla and short-term disability. In November, He has also cardiology and neurology at [facility] to help with long term (per notes in care everywhere). Cardiology deferred papers to neurology/Dr. Margaretmary Eddy office as the disability is r/t to pt's h/o strokes..  Dr. Cathie Hoops and team- please advise if this is something you are willing to complete. Defer back to neurology?  Thanks -[employer]

## 2021-03-15 NOTE — Telephone Encounter (Signed)
Please defer back to neurology for continuation of LTC. Hematology diagnosis not appropriate for LTD, per phone note in October.

## 2021-04-22 ENCOUNTER — Inpatient Hospital Stay: Payer: Self-pay | Attending: Oncology

## 2021-04-22 ENCOUNTER — Inpatient Hospital Stay: Payer: Self-pay | Admitting: Oncology

## 2021-08-05 ENCOUNTER — Ambulatory Visit: Payer: Self-pay | Admitting: Internal Medicine

## 2021-08-05 ENCOUNTER — Ambulatory Visit: Payer: Self-pay

## 2021-08-05 ENCOUNTER — Encounter: Payer: Self-pay | Admitting: Internal Medicine

## 2021-08-05 VITALS — BP 148/96 | HR 98 | Temp 96.8°F | Wt 195.0 lb

## 2021-08-05 DIAGNOSIS — I639 Cerebral infarction, unspecified: Secondary | ICD-10-CM

## 2021-08-05 DIAGNOSIS — Z6827 Body mass index (BMI) 27.0-27.9, adult: Secondary | ICD-10-CM

## 2021-08-05 DIAGNOSIS — E663 Overweight: Secondary | ICD-10-CM

## 2021-08-05 DIAGNOSIS — I213 ST elevation (STEMI) myocardial infarction of unspecified site: Secondary | ICD-10-CM

## 2021-08-05 DIAGNOSIS — E785 Hyperlipidemia, unspecified: Secondary | ICD-10-CM

## 2021-08-05 DIAGNOSIS — K219 Gastro-esophageal reflux disease without esophagitis: Secondary | ICD-10-CM

## 2021-08-05 DIAGNOSIS — I10 Essential (primary) hypertension: Secondary | ICD-10-CM

## 2021-08-05 DIAGNOSIS — E1169 Type 2 diabetes mellitus with other specified complication: Secondary | ICD-10-CM

## 2021-08-05 DIAGNOSIS — D6852 Prothrombin gene mutation: Secondary | ICD-10-CM

## 2021-08-05 DIAGNOSIS — I2510 Atherosclerotic heart disease of native coronary artery without angina pectoris: Secondary | ICD-10-CM

## 2021-08-05 DIAGNOSIS — E782 Mixed hyperlipidemia: Secondary | ICD-10-CM

## 2021-08-05 DIAGNOSIS — F4323 Adjustment disorder with mixed anxiety and depressed mood: Secondary | ICD-10-CM

## 2021-08-05 LAB — POCT GLYCOSYLATED HEMOGLOBIN (HGB A1C): HbA1c POC (<> result, manual entry): >14 % (ref 4.0–5.6)

## 2021-08-05 MED ORDER — ASPIRIN 81 MG PO TBEC: 81.0000 mg | DELAYED_RELEASE_TABLET | Freq: Every day | ORAL | 0 refills | Status: DC

## 2021-08-05 MED ORDER — BUPROPION HCL ER (XL) 150 MG PO TB24: 150.0000 mg | ORAL_TABLET | Freq: Every day | ORAL | 0 refills | Status: DC

## 2021-08-05 MED ORDER — LOSARTAN POTASSIUM 50 MG PO TABS: 50.0000 mg | ORAL_TABLET | Freq: Every day | ORAL | 0 refills | Status: DC

## 2021-08-05 MED ORDER — LANTUS SOLOSTAR 100 UNIT/ML ~~LOC~~ SOPN: 15.0000 [IU] | PEN_INJECTOR | Freq: Every day | SUBCUTANEOUS | 0 refills | Status: DC

## 2021-08-05 MED ORDER — METFORMIN HCL 1000 MG PO TABS: ORAL_TABLET | ORAL | 0 refills | Status: DC

## 2021-08-05 MED ORDER — FISH OIL 1200 MG PO CAPS
1200.0000 mg | ORAL_CAPSULE | Freq: Two times a day (BID) | ORAL | 0 refills | Status: DC
Start: 2021-08-05 — End: 2021-09-28

## 2021-08-05 MED ORDER — APIXABAN 5 MG PO TABS: 5.0000 mg | ORAL_TABLET | Freq: Two times a day (BID) | ORAL | 0 refills | Status: DC

## 2021-08-05 MED ORDER — ROSUVASTATIN CALCIUM 10 MG PO TABS: 10.0000 mg | ORAL_TABLET | Freq: Every day | ORAL | 0 refills | Status: DC

## 2021-08-05 NOTE — Telephone Encounter (Signed)
  Summary: medication dosage   Pharmacy needing clarification on dosage for lantus rx, pharmacy states rx came w/ 2 different dosages of 15 and 23   Please assist further    Routing to office high priority    Reason for Disposition  [1] Pharmacy calling with prescription question AND [2] triager unable to answer question  Answer Assessment - Initial Assessment Questions 1. NAME of MEDICATION: "What medicine are you calling about?"     Lantus 2. QUESTION: "What is your question?" (e.g., double dose of medicine, side effect)     Please clarify dose 2 doses are listed 3. PRESCRIBING HCP: "Who prescribed it?" Reason: if prescribed by specialist, call should be referred to that group.     [name]  Protocols used: Medication Question Call-A-AH

## 2021-08-05 NOTE — Progress Notes (Signed)
Subjective:    Patient ID: Martin Mullen, male    DOB: 08-Nov-1980, 41 y.o.   MRN: [id number]  HPI  Patient presents to clinic today for follow-up of chronic conditions.  HTN: His BP today is 148/96.  He is not taking Lisinopril.  ECG from 08/2020 reviewed.  HLD with CAD status post STEMI, History of Stroke: His last LDL was 53, triglycerides 138, 11/2020.  He is not taking Rosuvastatin and Fish Oil.  He is not taking Aspirin and Eliquis as well.  He does not consume a low-fat diet.  He no longer follows with cardiology and neurology.  DM2: His last A1c was 9.2%, 11/2020.  He is not taking Metformin and Lantus as prescribed.  He is on Lisinopril for renal protection but not taking it.  He does not check his sugars.  He does not check his feet routinely.  His last eye exam was 10/2020, [place].  Flu 11/2020.  Pneumovax 06/2017.  COVID never.  GERD: Currently not an issue. He is not taking any medications OTC for this.  There is no upper GI on file.  Anxiety and Depression: Deteriorated since his girlfriend and the mother of his children left him in February.  He is taking not taking bupropion as prescribed.  He is not currently seeing a therapist.  He denies SI/HI.  Review of Systems     Past Medical History:  Diagnosis Date   Acute transmural inferior wall MI ([place]) 08/28/2012   .5 x 12 mm Veri-flex stent non-DES.   Chicken pox    Coronary artery disease    Inferior ST elevation myocardial infarction in July of 2014. Cardiac catheterization showed 95% distal RCA stenosis and 90% first diagonal stenosis with normal ejection fraction. He underwent PCI in bare-metal stent placement to the distal RCA.   Diabetes mellitus without complication (HCC)    Hypercholesterolemia    Stroke ([place])    Tobacco use     Current Outpatient Medications  Medication Sig Dispense Refill   apixaban (ELIQUIS) 5 MG TABS tablet Take 1 tablet (5 mg total) by mouth 2 (two) times daily. 180 tablet 0    aspirin 81 MG EC tablet Take 1 tablet (81 mg total) by mouth daily. 30 tablet 4   Blood Glucose Monitoring Suppl (BAYER CONTOUR NEXT MONITOR) w/Device KIT 1 Device 1 kit 0   buPROPion (WELLBUTRIN XL) 150 MG 24 hr tablet Take 1 tablet (150 mg total) by mouth daily. 30 tablet 2   CONTOUR NEXT TEST test strip USE 1 STRIP TO CHECK GLUCOSE THREE TIMES DAILY AS NEEDED 100 each 0   insulin glargine (LANTUS SOLOSTAR) 100 UNIT/ML Solostar Pen ADMINISTER 23 UNITS UNDER THE SKIN EVERY DAY (Patient taking differently: Inject 30 Units into the skin daily. ADMINISTER 23 UNITS UNDER THE SKIN EVERY DAY) 15 mL 1   lisinopril (ZESTRIL) 5 MG tablet Take 5 mg by mouth daily.     metFORMIN (GLUCOPHAGE) 1000 MG tablet TAKE 1 TABLET(1000 MG) BY MOUTH TWICE DAILY WITH A MEAL 180 tablet 1   Multiple Vitamin (MULTIVITAMIN WITH MINERALS) TABS tablet Take 1 tablet by mouth in the morning. One-A-Day Multivitamin     nitroGLYCERIN (NITROSTAT) 0.4 MG SL tablet DISSOLVE ONE TABLET UNDER THE TONGUE EVERY 5 MINUTES AS NEEDED FOR CHEST PAIN.  DO NOT EXCEED A TOTAL OF 3 DOSES IN 15 MINUTES (Patient not taking: Reported on 12/23/2020) 25 tablet 2   Omega-3 Fatty Acids (FISH OIL) 1200 MG CAPS Take 1,200  mg by mouth in the morning and at bedtime.     rosuvastatin (CRESTOR) 10 MG tablet Take 1 tablet (10 mg total) by mouth daily. 30 tablet 2   No current facility-administered medications for this visit.    No Known Allergies  Family History  Problem Relation Age of Onset   Arthritis Mother        Rheumatoid   Diabetes Mother    Hyperlipidemia Mother    Diabetes Father    Cancer Maternal Grandfather        Prostate   Parkinson's disease Maternal Grandfather     Social History   Socioeconomic History   Marital status: Divorced    Spouse name: Not on file   Number of children: 2   Years of education: Not on file   Highest education level: Not on file  Occupational History   Not on file  Tobacco Use   Smoking status:  Every Day    Packs/day: 1.00    Years: 30.00    Total pack years: 30.00    Types: Cigarettes   Smokeless tobacco: Never  Vaping Use   Vaping Use: Never used  Substance and Sexual Activity   Alcohol use: Not Currently    Alcohol/week: 0.0 standard drinks of alcohol    Comment: occasional   Drug use: Not Currently    Types: Marijuana   Sexual activity: Yes    Birth control/protection: None  Other Topics Concern   Not on file  Social History Narrative   Lives with girlfriend and her mother    Social Determinants of Health   Financial Resource Strain: Not on file  Food Insecurity: Not on file  Transportation Needs: Not on file  Physical Activity: Not on file  Stress: Not on file  Social Connections: Not on file  Intimate Partner Violence: Not on file     Constitutional: Patient reports intentional weight loss.  Denies fever, malaise, fatigue, headache.  HEENT: Denies eye pain, eye redness, ear pain, ringing in the ears, wax buildup, runny nose, nasal congestion, bloody nose, or sore throat. Respiratory: Denies difficulty breathing, shortness of breath, cough or sputum production.   Cardiovascular: Denies chest pain, chest tightness, palpitations or swelling in the hands or feet.  Gastrointestinal: Denies abdominal pain, bloating, constipation, diarrhea or blood in the stool.  GU: Denies urgency, frequency, pain with urination, burning sensation, blood in urine, odor or discharge. Musculoskeletal: Patient reports difficulty with gait, left-sided weakness.  Denies decrease in range of motion, muscle pain or joint pain and swelling.  Skin: Denies redness, rashes, lesions or ulcercations.  Neurological: Patient reports difficulty with speech, balance and coordination.  Denies dizziness, difficulty with memory.  Psych: Patient has a history of anxiety and depression.  Denies SI/HI.  No other specific complaints in a complete review of systems (except as listed in HPI  above).  Objective:   Physical Exam BP (!) 148/96 (BP Location: Right Arm, Patient Position: Sitting, Cuff Size: Normal)   Pulse 98   Temp (!) 96.8 F (36 C) (Temporal)   Wt 195 lb (88.5 kg)   SpO2 99%   BMI 27.98 kg/m   Wt Readings from Last 3 Encounters:  12/23/20 236 lb (107 kg)  12/16/20 240 lb (108.9 kg)  12/07/20 241 lb (109.3 kg)    General: Appears his stated age, chronically ill-appearing, overweight, in NAD. Skin: Warm, dry and intact. No ulcerations noted. HEENT: Head: normal shape and size; Eyes: sclera white, no icterus, conjunctiva pink, PERRLA  and EOMs intact;  Neck:  Neck supple, trachea midline. No masses, lumps or thyromegaly present.  Cardiovascular: Normal rate and rhythm. S1,S2 noted.  No murmur, rubs or gallops noted. No JVD or BLE edema.  Pulmonary/Chest: Normal effort and positive vesicular breath sounds. No respiratory distress. No wheezes, rales or ronchi noted.  Abdomen:  Normal bowel sounds.  Musculoskeletal: Strength 5/5 BUE.  Strength 5/5 RLE.  Strength 4/5 LLE.  Gait slow and steady with use of cane. Neurological: Alert and oriented. Coordination normal but slowed.  Psychiatric: Mood and affect flat.  Behavior is normal. Judgment and thought content normal.     BMET    Component Value Date/Time   NA 138 12/23/2020 1340   NA 140 11/13/2019 1550   NA 139 01/29/2013 0040   K 4.4 12/23/2020 1340   K 4.0 01/29/2013 0040   CL 102 12/23/2020 1340   CL 107 01/29/2013 0040   CO2 27 12/23/2020 1340   CO2 28 01/29/2013 0040   GLUCOSE 145 (H) 12/23/2020 1340   GLUCOSE 228 (H) 01/29/2013 0040   BUN 10 12/23/2020 1340   BUN 10 11/13/2019 1550   BUN 15 01/29/2013 0040   CREATININE 0.88 12/23/2020 1340   CREATININE 0.91 12/07/2020 1037   CALCIUM 9.3 12/23/2020 1340   CALCIUM 9.2 01/29/2013 0040   GFRNONAA >60 12/23/2020 1340   GFRNONAA >60 01/29/2013 0040   GFRAA 104 11/13/2019 1550   GFRAA >60 01/29/2013 0040    Lipid Panel     Component  Value Date/Time   CHOL 102 12/07/2020 1037   CHOL 133 11/13/2019 1550   TRIG 138 12/07/2020 1037   HDL 27 (L) 12/07/2020 1037   HDL 24 (L) 11/13/2019 1550   CHOLHDL 3.8 12/07/2020 1037   VLDL 36 08/27/2020 0720   LDLCALC 53 12/07/2020 1037    CBC    Component Value Date/Time   WBC 12.1 (H) 12/23/2020 1340   RBC 5.04 12/23/2020 1340   HGB 16.1 12/23/2020 1340   HGB 16.9 11/13/2019 1550   HCT 45.6 12/23/2020 1340   HCT 49.9 11/13/2019 1550   PLT 259 12/23/2020 1340   PLT 271 11/13/2019 1550   MCV 90.5 12/23/2020 1340   MCV 93 11/13/2019 1550   MCV 93 01/29/2013 0040   MCH 31.9 12/23/2020 1340   MCHC 35.3 12/23/2020 1340   RDW 12.6 12/23/2020 1340   RDW 13.6 11/13/2019 1550   RDW 13.8 01/29/2013 0040   LYMPHSABS 4.3 (H) 12/23/2020 1340   MONOABS 0.6 12/23/2020 1340   EOSABS 0.6 (H) 12/23/2020 1340   BASOSABS 0.1 12/23/2020 1340    Hgb A1C Lab Results  Component Value Date   HGBA1C 9.2 (H) 12/07/2020           Assessment & Plan:     Webb Silversmith, NP

## 2021-08-05 NOTE — Assessment & Plan Note (Signed)
Lipid profile today We will have him restart losartan, rosuvastatin, fish oil, aspirin and eliquis, medications refilled Advised him to follow-up with cardiology

## 2021-08-05 NOTE — Assessment & Plan Note (Signed)
C-Met and lipid profile today Encouraged him to consume a low-fat diet We will have him restart rosuvastatin

## 2021-08-05 NOTE — Assessment & Plan Note (Signed)
We will restart his Eliquis He is not currently following with hematology

## 2021-08-05 NOTE — Assessment & Plan Note (Signed)
We will restart bupropion Support offered

## 2021-08-05 NOTE — Assessment & Plan Note (Signed)
Encourage diet and exercise for weight loss 

## 2021-08-05 NOTE — Assessment & Plan Note (Signed)
Lipid profile today We will have him restart rosuvastatin, fish oil, aspirin and eliquis, medications refilled Advised him to follow-up with cardiology

## 2021-08-05 NOTE — Assessment & Plan Note (Signed)
POCT A1c >14% We will check urine microalbumin Encouraged him to consume a low-carb diet and exercise for weight loss Encouraged him to schedule an appointment for an eye exam Foot exam today We will restart metformin and Lantus, medication sent to pharmacy

## 2021-08-05 NOTE — Assessment & Plan Note (Signed)
Currently not an issue °We will monitor °

## 2021-08-05 NOTE — Assessment & Plan Note (Signed)
Lipid profile today We will have him restart losartan, rosuvastatin, fish oil, aspirin and eliquis, medications refilled Advised him to follow-up with cardiology and neurology

## 2021-08-05 NOTE — Patient Instructions (Signed)

## 2021-08-05 NOTE — Assessment & Plan Note (Signed)
Uncontrolled off meds Rx for losartan 50 mg sent to pharmacy Reinforced DASH diet and exercise for weight loss C-Met today

## 2021-08-11 ENCOUNTER — Other Ambulatory Visit: Payer: Self-pay | Admitting: Internal Medicine

## 2021-08-12 LAB — CBC WITH DIFFERENTIAL/PLATELET
Basophils Absolute: 0.1 10*3/uL (ref 0.0–0.2)
Basos: 1 %
EOS (ABSOLUTE): 0.5 10*3/uL — ABNORMAL HIGH (ref 0.0–0.4)
Eos: 4 %
Hematocrit: 51 % (ref 37.5–51.0)
Hemoglobin: 17.9 g/dL — ABNORMAL HIGH (ref 13.0–17.7)
Immature Grans (Abs): 0 10*3/uL (ref 0.0–0.1)
Immature Granulocytes: 0 %
Lymphocytes Absolute: 4.8 10*3/uL — ABNORMAL HIGH (ref 0.7–3.1)
Lymphs: 36 %
MCH: 32.5 pg (ref 26.6–33.0)
MCHC: 35.1 g/dL (ref 31.5–35.7)
MCV: 93 fL (ref 79–97)
Monocytes Absolute: 0.7 10*3/uL (ref 0.1–0.9)
Monocytes: 5 %
Neutrophils Absolute: 7.2 10*3/uL — ABNORMAL HIGH (ref 1.4–7.0)
Neutrophils: 54 %
Platelets: 261 10*3/uL (ref 150–450)
RBC: 5.51 x10E6/uL (ref 4.14–5.80)
RDW: 12.4 % (ref 11.6–15.4)
WBC: 13.3 10*3/uL — ABNORMAL HIGH (ref 3.4–10.8)

## 2021-08-12 LAB — COMPREHENSIVE METABOLIC PANEL
ALT: 8 IU/L (ref 0–44)
AST: 8 IU/L (ref 0–40)
Albumin/Globulin Ratio: 1.7 (ref 1.2–2.2)
Albumin: 4.2 g/dL (ref 4.0–5.0)
Alkaline Phosphatase: 122 IU/L — ABNORMAL HIGH (ref 44–121)
BUN/Creatinine Ratio: 11 (ref 9–20)
BUN: 11 mg/dL (ref 6–24)
Bilirubin Total: 0.3 mg/dL (ref 0.0–1.2)
CO2: 22 mmol/L (ref 20–29)
Calcium: 9.8 mg/dL (ref 8.7–10.2)
Chloride: 100 mmol/L (ref 96–106)
Creatinine, Ser: 1.02 mg/dL (ref 0.76–1.27)
Globulin, Total: 2.5 g/dL (ref 1.5–4.5)
Glucose: 234 mg/dL — ABNORMAL HIGH (ref 70–99)
Potassium: 4 mmol/L (ref 3.5–5.2)
Sodium: 140 mmol/L (ref 134–144)
Total Protein: 6.7 g/dL (ref 6.0–8.5)
eGFR: 95 mL/min/{1.73_m2} (ref >59)

## 2021-08-12 LAB — LIPID PANEL W/O CHOL/HDL RATIO
Cholesterol, Total: 159 mg/dL (ref 100–199)
HDL: 26 mg/dL — ABNORMAL LOW (ref >39)
LDL Chol Calc (NIH): 102 mg/dL — ABNORMAL HIGH (ref 0–99)
Triglycerides: 174 mg/dL — ABNORMAL HIGH (ref 0–149)
VLDL Cholesterol Cal: 31 mg/dL (ref 5–40)

## 2021-08-12 LAB — MICROALBUMIN / CREATININE URINE RATIO
Creatinine, Urine: 190.6 mg/dL
Microalb/Creat Ratio: 76 mg/g creat — ABNORMAL HIGH (ref 0–29)
Microalbumin, Urine: 144.8 ug/mL

## 2021-08-19 ENCOUNTER — Ambulatory Visit: Payer: Self-pay | Admitting: Internal Medicine

## 2021-08-19 NOTE — Progress Notes (Deleted)
Subjective:    Patient ID: Martin Mullen, male    DOB: 1980-02-19, 41 y.o.   MRN: [id number]  HPI  Patient presents to clinic today for 2-week follow-up of HTN.  At his last visit, he was started on Losartan 50 mg daily.  He is taking the medication as prescribed.  His BP today is.  ECG from 08/2020 reviewed.  Review of Systems     Past Medical History:  Diagnosis Date   Acute transmural inferior wall MI ([place]) 08/28/2012   .5 x 12 mm Veri-flex stent non-DES.   Chicken pox    Coronary artery disease    Inferior ST elevation myocardial infarction in July of 2014. Cardiac catheterization showed 95% distal RCA stenosis and 90% first diagonal stenosis with normal ejection fraction. He underwent PCI in bare-metal stent placement to the distal RCA.   Diabetes mellitus without complication (HCC)    Hypercholesterolemia    Stroke ([place])    Tobacco use     Current Outpatient Medications  Medication Sig Dispense Refill   apixaban (ELIQUIS) 5 MG TABS tablet Take 1 tablet (5 mg total) by mouth 2 (two) times daily. 180 tablet 0   aspirin EC 81 MG tablet Take 1 tablet (81 mg total) by mouth daily. 90 tablet 0   Blood Glucose Monitoring Suppl (BAYER CONTOUR NEXT MONITOR) w/Device KIT 1 Device 1 kit 0   buPROPion (WELLBUTRIN XL) 150 MG 24 hr tablet Take 1 tablet (150 mg total) by mouth daily. 90 tablet 0   CONTOUR NEXT TEST test strip USE 1 STRIP TO CHECK GLUCOSE THREE TIMES DAILY AS NEEDED 100 each 0   insulin glargine (LANTUS SOLOSTAR) 100 UNIT/ML Solostar Pen Inject 15 Units into the skin at bedtime. ADMINISTER 23 UNITS UNDER THE SKIN EVERY DAY 15 mL 0   losartan (COZAAR) 50 MG tablet Take 1 tablet (50 mg total) by mouth daily. 90 tablet 0   metFORMIN (GLUCOPHAGE) 1000 MG tablet TAKE 1 TABLET(1000 MG) BY MOUTH TWICE DAILY WITH A MEAL 180 tablet 0   Multiple Vitamin (MULTIVITAMIN WITH MINERALS) TABS tablet Take 1 tablet by mouth in the morning. One-A-Day Multivitamin     nitroGLYCERIN  (NITROSTAT) 0.4 MG SL tablet DISSOLVE ONE TABLET UNDER THE TONGUE EVERY 5 MINUTES AS NEEDED FOR CHEST PAIN.  DO NOT EXCEED A TOTAL OF 3 DOSES IN 15 MINUTES 25 tablet 2   Omega-3 Fatty Acids (FISH OIL) 1200 MG CAPS Take 1 capsule (1,200 mg total) by mouth in the morning and at bedtime. 360 capsule 0   rosuvastatin (CRESTOR) 10 MG tablet Take 1 tablet (10 mg total) by mouth daily. 90 tablet 0   No current facility-administered medications for this visit.    No Known Allergies  Family History  Problem Relation Age of Onset   Arthritis Mother        Rheumatoid   Diabetes Mother    Hyperlipidemia Mother    Diabetes Father    Cancer Maternal Grandfather        Prostate   Parkinson's disease Maternal Grandfather     Social History   Socioeconomic History   Marital status: Divorced    Spouse name: Not on file   Number of children: 2   Years of education: Not on file   Highest education level: Not on file  Occupational History   Not on file  Tobacco Use   Smoking status: Every Day    Packs/day: 1.00    Years: 30.00  Total pack years: 30.00    Types: Cigarettes   Smokeless tobacco: Never  Vaping Use   Vaping Use: Never used  Substance and Sexual Activity   Alcohol use: Not Currently    Alcohol/week: 0.0 standard drinks of alcohol    Comment: occasional   Drug use: Not Currently    Types: Marijuana   Sexual activity: Yes    Birth control/protection: None  Other Topics Concern   Not on file  Social History Narrative   Lives with girlfriend and her mother    Social Determinants of Health   Financial Resource Strain: Not on file  Food Insecurity: Not on file  Transportation Needs: Not on file  Physical Activity: Not on file  Stress: Not on file  Social Connections: Not on file  Intimate Partner Violence: Not on file     Constitutional: Denies fever, malaise, fatigue, headache or abrupt weight changes.  Respiratory: Denies difficulty breathing, shortness of  breath, cough or sputum production.   Cardiovascular: Denies chest pain, chest tightness, palpitations or swelling in the hands or feet.  Neurological: Patient has some expressive aphasia post stroke.  Denies dizziness, difficulty with memory, difficulty with speech or problems with balance and coordination.    No other specific complaints in a complete review of systems (except as listed in HPI above).  Objective:   Physical Exam  There were no vitals taken for this visit. Wt Readings from Last 3 Encounters:  08/05/21 195 lb (88.5 kg)  12/23/20 236 lb (107 kg)  12/16/20 240 lb (108.9 kg)    General: Appears their stated age, well developed, well nourished in NAD. Skin: Warm, dry and intact. No rashes, lesions or ulcerations noted. HEENT: Head: normal shape and size; Eyes: sclera white, no icterus, conjunctiva pink, PERRLA and EOMs intact; Ears: Tm's gray and intact, normal light reflex; Nose: mucosa pink and moist, septum midline; Throat/Mouth: Teeth present, mucosa pink and moist, no exudate, lesions or ulcerations noted.  Neck:  Neck supple, trachea midline. No masses, lumps or thyromegaly present.  Cardiovascular: Normal rate and rhythm. S1,S2 noted.  No murmur, rubs or gallops noted. No JVD or BLE edema. No carotid bruits noted. Pulmonary/Chest: Normal effort and positive vesicular breath sounds. No respiratory distress. No wheezes, rales or ronchi noted.  Abdomen: Soft and nontender. Normal bowel sounds. No distention or masses noted. Liver, spleen and kidneys non palpable. Musculoskeletal: Normal range of motion. No signs of joint swelling. No difficulty with gait.  Neurological: Alert and oriented. Cranial nerves II-XII grossly intact. Coordination normal.  Psychiatric: Mood and affect normal. Behavior is normal. Judgment and thought content normal.   EKG:  BMET    Component Value Date/Time   NA 140 08/11/2021 0937   NA 139 01/29/2013 0040   K 4.0 08/11/2021 0937   K 4.0  01/29/2013 0040   CL 100 08/11/2021 0937   CL 107 01/29/2013 0040   CO2 22 08/11/2021 0937   CO2 28 01/29/2013 0040   GLUCOSE 234 (H) 08/11/2021 0937   GLUCOSE 145 (H) 12/23/2020 1340   GLUCOSE 228 (H) 01/29/2013 0040   BUN 11 08/11/2021 0937   BUN 15 01/29/2013 0040   CREATININE 1.02 08/11/2021 0937   CREATININE 0.91 12/07/2020 1037   CALCIUM 9.8 08/11/2021 0937   CALCIUM 9.2 01/29/2013 0040   GFRNONAA >60 12/23/2020 1340   GFRNONAA >60 01/29/2013 0040   GFRAA 104 11/13/2019 1550   GFRAA >60 01/29/2013 0040    Lipid Panel  Component Value Date/Time   CHOL 159 08/11/2021 0937   TRIG 174 (H) 08/11/2021 0937   HDL 26 (L) 08/11/2021 0937   CHOLHDL 3.8 12/07/2020 1037   VLDL 36 08/27/2020 0720   LDLCALC 102 (H) 08/11/2021 0937   LDLCALC 53 12/07/2020 1037    CBC    Component Value Date/Time   WBC 13.3 (H) 08/11/2021 0937   WBC 12.1 (H) 12/23/2020 1340   RBC 5.51 08/11/2021 0937   RBC 5.04 12/23/2020 1340   HGB 17.9 (H) 08/11/2021 0937   HCT 51.0 08/11/2021 0937   PLT 261 08/11/2021 0937   MCV 93 08/11/2021 0937   MCV 93 01/29/2013 0040   MCH 32.5 08/11/2021 0937   MCH 31.9 12/23/2020 1340   MCHC 35.1 08/11/2021 0937   MCHC 35.3 12/23/2020 1340   RDW 12.4 08/11/2021 0937   RDW 13.8 01/29/2013 0040   LYMPHSABS 4.8 (H) 08/11/2021 0937   MONOABS 0.6 12/23/2020 1340   EOSABS 0.5 (H) 08/11/2021 0937   BASOSABS 0.1 08/11/2021 0937    Hgb A1C Lab Results  Component Value Date   HGBA1C >14 08/05/2021           Assessment & Plan:    RTC in 3 months for follow-up of chronic conditions Webb Silversmith, NP

## 2021-08-26 ENCOUNTER — Telehealth: Payer: Self-pay

## 2021-08-26 NOTE — Telephone Encounter (Signed)
Received LTC disability forms from [employer]. Faxed back to [facility] and informed them that these should be sent to neurology as they have been managing previous LTC forms.

## 2021-09-11 ENCOUNTER — Other Ambulatory Visit: Payer: Self-pay

## 2021-09-11 ENCOUNTER — Emergency Department (HOSPITAL_COMMUNITY): Payer: Self-pay

## 2021-09-11 ENCOUNTER — Inpatient Hospital Stay (HOSPITAL_COMMUNITY)
Admission: EM | Admit: 2021-09-11 | Discharge: 2021-09-28 | DRG: 065 | Disposition: A | Discharge status: Home Health | Payer: Self-pay | Attending: Internal Medicine | Admitting: Internal Medicine

## 2021-09-11 ENCOUNTER — Encounter (HOSPITAL_COMMUNITY): Payer: Self-pay | Admitting: Emergency Medicine

## 2021-09-11 DIAGNOSIS — Z8673 Personal history of transient ischemic attack (TIA), and cerebral infarction without residual deficits: Secondary | ICD-10-CM

## 2021-09-11 DIAGNOSIS — Z955 Presence of coronary angioplasty implant and graft: Secondary | ICD-10-CM

## 2021-09-11 DIAGNOSIS — I6329 Cerebral infarction due to unspecified occlusion or stenosis of other precerebral arteries: Secondary | ICD-10-CM | POA: Diagnosis not present

## 2021-09-11 DIAGNOSIS — Z7982 Long term (current) use of aspirin: Secondary | ICD-10-CM

## 2021-09-11 DIAGNOSIS — R471 Dysarthria and anarthria: Secondary | ICD-10-CM | POA: Diagnosis present

## 2021-09-11 DIAGNOSIS — E1169 Type 2 diabetes mellitus with other specified complication: Secondary | ICD-10-CM | POA: Diagnosis present

## 2021-09-11 DIAGNOSIS — Z8261 Family history of arthritis: Secondary | ICD-10-CM

## 2021-09-11 DIAGNOSIS — Z83438 Family history of other disorder of lipoprotein metabolism and other lipidemia: Secondary | ICD-10-CM

## 2021-09-11 DIAGNOSIS — Z7984 Long term (current) use of oral hypoglycemic drugs: Secondary | ICD-10-CM

## 2021-09-11 DIAGNOSIS — Z794 Long term (current) use of insulin: Secondary | ICD-10-CM

## 2021-09-11 DIAGNOSIS — E876 Hypokalemia: Secondary | ICD-10-CM | POA: Diagnosis present

## 2021-09-11 DIAGNOSIS — I2510 Atherosclerotic heart disease of native coronary artery without angina pectoris: Secondary | ICD-10-CM | POA: Diagnosis present

## 2021-09-11 DIAGNOSIS — R1312 Dysphagia, oropharyngeal phase: Secondary | ICD-10-CM | POA: Diagnosis present

## 2021-09-11 DIAGNOSIS — E860 Dehydration: Secondary | ICD-10-CM | POA: Diagnosis present

## 2021-09-11 DIAGNOSIS — I252 Old myocardial infarction: Secondary | ICD-10-CM

## 2021-09-11 DIAGNOSIS — I639 Cerebral infarction, unspecified: Secondary | ICD-10-CM | POA: Diagnosis not present

## 2021-09-11 DIAGNOSIS — I213 ST elevation (STEMI) myocardial infarction of unspecified site: Secondary | ICD-10-CM | POA: Diagnosis present

## 2021-09-11 DIAGNOSIS — E7800 Pure hypercholesterolemia, unspecified: Secondary | ICD-10-CM | POA: Diagnosis present

## 2021-09-11 DIAGNOSIS — D6852 Prothrombin gene mutation: Secondary | ICD-10-CM | POA: Diagnosis present

## 2021-09-11 DIAGNOSIS — Z833 Family history of diabetes mellitus: Secondary | ICD-10-CM

## 2021-09-11 DIAGNOSIS — R29704 NIHSS score 4: Secondary | ICD-10-CM | POA: Diagnosis present

## 2021-09-11 DIAGNOSIS — Z91128 Patient's intentional underdosing of medication regimen for other reason: Secondary | ICD-10-CM

## 2021-09-11 DIAGNOSIS — Z7901 Long term (current) use of anticoagulants: Secondary | ICD-10-CM

## 2021-09-11 DIAGNOSIS — T45516A Underdosing of anticoagulants, initial encounter: Secondary | ICD-10-CM | POA: Diagnosis present

## 2021-09-11 DIAGNOSIS — Z79899 Other long term (current) drug therapy: Secondary | ICD-10-CM

## 2021-09-11 DIAGNOSIS — F17210 Nicotine dependence, cigarettes, uncomplicated: Secondary | ICD-10-CM | POA: Diagnosis present

## 2021-09-11 DIAGNOSIS — Y92009 Unspecified place in unspecified non-institutional (private) residence as the place of occurrence of the external cause: Secondary | ICD-10-CM

## 2021-09-11 DIAGNOSIS — Z820 Family history of epilepsy and other diseases of the nervous system: Secondary | ICD-10-CM

## 2021-09-11 DIAGNOSIS — G8191 Hemiplegia, unspecified affecting right dominant side: Secondary | ICD-10-CM | POA: Diagnosis present

## 2021-09-11 DIAGNOSIS — E1165 Type 2 diabetes mellitus with hyperglycemia: Secondary | ICD-10-CM | POA: Diagnosis present

## 2021-09-11 DIAGNOSIS — Z8042 Family history of malignant neoplasm of prostate: Secondary | ICD-10-CM

## 2021-09-11 DIAGNOSIS — E785 Hyperlipidemia, unspecified: Secondary | ICD-10-CM

## 2021-09-11 DIAGNOSIS — I10 Essential (primary) hypertension: Secondary | ICD-10-CM | POA: Diagnosis present

## 2021-09-11 LAB — CBC
HCT: 42.6 % (ref 39.0–52.0)
Hemoglobin: 14.9 g/dL (ref 13.0–17.0)
MCH: 31.6 pg (ref 26.0–34.0)
MCHC: 35 g/dL (ref 30.0–36.0)
MCV: 90.4 fL (ref 80.0–100.0)
Platelets: 213 10*3/uL (ref 150–400)
RBC: 4.71 MIL/uL (ref 4.22–5.81)
RDW: 12.5 % (ref 11.5–15.5)
WBC: 8.1 10*3/uL (ref 4.0–10.5)
nRBC: 0 % (ref 0.0–0.2)

## 2021-09-11 LAB — I-STAT CHEM 8, ED
BUN: 4 mg/dL — ABNORMAL LOW (ref 6–20)
Calcium, Ion: 1.12 mmol/L — ABNORMAL LOW (ref 1.15–1.40)
Chloride: 103 mmol/L (ref 98–111)
Creatinine, Ser: 0.6 mg/dL — ABNORMAL LOW (ref 0.61–1.24)
Glucose, Bld: 266 mg/dL — ABNORMAL HIGH (ref 70–99)
HCT: 42 % (ref 39.0–52.0)
Hemoglobin: 14.3 g/dL (ref 13.0–17.0)
Potassium: 3.6 mmol/L (ref 3.5–5.1)
Sodium: 138 mmol/L (ref 135–145)
TCO2: 22 mmol/L (ref 22–32)

## 2021-09-11 LAB — COMPREHENSIVE METABOLIC PANEL
ALT: 6 U/L (ref 0–44)
AST: 9 U/L — ABNORMAL LOW (ref 15–41)
Albumin: 2.8 g/dL — ABNORMAL LOW (ref 3.5–5.0)
Alkaline Phosphatase: 101 U/L (ref 38–126)
Anion gap: 8 (ref 5–15)
BUN: 5 mg/dL — ABNORMAL LOW (ref 6–20)
CO2: 23 mmol/L (ref 22–32)
Calcium: 8.5 mg/dL — ABNORMAL LOW (ref 8.9–10.3)
Chloride: 106 mmol/L (ref 98–111)
Creatinine, Ser: 0.76 mg/dL (ref 0.61–1.24)
GFR, Estimated: >60 mL/min (ref >60)
Glucose, Bld: 266 mg/dL — ABNORMAL HIGH (ref 70–99)
Potassium: 3.6 mmol/L (ref 3.5–5.1)
Sodium: 137 mmol/L (ref 135–145)
Total Bilirubin: 0.3 mg/dL (ref 0.3–1.2)
Total Protein: 6 g/dL — ABNORMAL LOW (ref 6.5–8.1)

## 2021-09-11 LAB — DIFFERENTIAL
Abs Immature Granulocytes: 0.03 10*3/uL (ref 0.00–0.07)
Basophils Absolute: 0.1 10*3/uL (ref 0.0–0.1)
Basophils Relative: 1 %
Eosinophils Absolute: 0.7 10*3/uL — ABNORMAL HIGH (ref 0.0–0.5)
Eosinophils Relative: 8 %
Immature Granulocytes: 0 %
Lymphocytes Relative: 37 %
Lymphs Abs: 3 10*3/uL (ref 0.7–4.0)
Monocytes Absolute: 0.5 10*3/uL (ref 0.1–1.0)
Monocytes Relative: 6 %
Neutro Abs: 3.9 10*3/uL (ref 1.7–7.7)
Neutrophils Relative %: 48 %

## 2021-09-11 LAB — PROTIME-INR
INR: 1 (ref 0.8–1.2)
Prothrombin Time: 13.3 seconds (ref 11.4–15.2)

## 2021-09-11 LAB — CBG MONITORING, ED: Glucose-Capillary: 217 mg/dL — ABNORMAL HIGH (ref 70–99)

## 2021-09-11 LAB — ETHANOL: Alcohol, Ethyl (B): <10 mg/dL (ref <10)

## 2021-09-11 LAB — APTT: aPTT: 28 seconds (ref 24–36)

## 2021-09-11 MED ORDER — ACETAMINOPHEN 650 MG RE SUPP: 650.0000 mg | RECTAL | Status: DC | PRN

## 2021-09-11 MED ORDER — STROKE: EARLY STAGES OF RECOVERY BOOK
Freq: Once | Status: DC
  Filled 2021-09-11: qty 1

## 2021-09-11 MED ORDER — ACETAMINOPHEN 160 MG/5ML PO SOLN: 650.0000 mg | ORAL | Status: DC | PRN

## 2021-09-11 MED ORDER — ACETAMINOPHEN 325 MG PO TABS: 650.0000 mg | ORAL_TABLET | ORAL | Status: DC | PRN

## 2021-09-11 MED ORDER — IOHEXOL 350 MG/ML SOLN
100.0000 mL | Freq: Once | INTRAVENOUS | Status: AC | PRN
  Administered 2021-09-11: 100 mL via INTRAVENOUS

## 2021-09-11 MED ORDER — INSULIN ASPART 100 UNIT/ML IJ SOLN
0.0000 [IU] | Freq: Three times a day (TID) | INTRAMUSCULAR | Status: DC
  Administered 2021-09-11: 7 [IU] via SUBCUTANEOUS
  Administered 2021-09-12 (×2): 4 [IU] via SUBCUTANEOUS
  Administered 2021-09-12: 7 [IU] via SUBCUTANEOUS
  Administered 2021-09-13 (×2): 4 [IU] via SUBCUTANEOUS
  Administered 2021-09-13: 3 [IU] via SUBCUTANEOUS
  Administered 2021-09-13 – 2021-09-14 (×4): 4 [IU] via SUBCUTANEOUS
  Administered 2021-09-14: 3 [IU] via SUBCUTANEOUS
  Administered 2021-09-15 – 2021-09-16 (×3): 4 [IU] via SUBCUTANEOUS
  Administered 2021-09-16: 3 [IU] via SUBCUTANEOUS
  Administered 2021-09-17 (×2): 4 [IU] via SUBCUTANEOUS
  Administered 2021-09-17: 3 [IU] via SUBCUTANEOUS
  Administered 2021-09-18 (×2): 7 [IU] via SUBCUTANEOUS
  Administered 2021-09-18: 3 [IU] via SUBCUTANEOUS
  Administered 2021-09-19: 11 [IU] via SUBCUTANEOUS
  Administered 2021-09-19: 4 [IU] via SUBCUTANEOUS
  Administered 2021-09-19 – 2021-09-20 (×2): 7 [IU] via SUBCUTANEOUS
  Administered 2021-09-20 – 2021-09-23 (×7): 4 [IU] via SUBCUTANEOUS
  Administered 2021-09-23 – 2021-09-24 (×2): 3 [IU] via SUBCUTANEOUS
  Administered 2021-09-24 – 2021-09-25 (×3): 4 [IU] via SUBCUTANEOUS
  Administered 2021-09-25: 7 [IU] via SUBCUTANEOUS
  Administered 2021-09-25: 3 [IU] via SUBCUTANEOUS
  Administered 2021-09-26: 4 [IU] via SUBCUTANEOUS
  Administered 2021-09-26: 3 [IU] via SUBCUTANEOUS
  Administered 2021-09-27: 7 [IU] via SUBCUTANEOUS
  Administered 2021-09-27 – 2021-09-28 (×4): 3 [IU] via SUBCUTANEOUS
  Administered 2021-09-28: 4 [IU] via SUBCUTANEOUS

## 2021-09-11 MED ORDER — SODIUM CHLORIDE 0.9% FLUSH: 3.0000 mL | Freq: Once | INTRAVENOUS | Status: DC

## 2021-09-11 NOTE — ED Triage Notes (Signed)
Code stroke. LKW at 0300AM. Came in for slurred speech. Alert and oriented x 4.

## 2021-09-11 NOTE — ED Notes (Addendum)
Pt's family contact is his brother Alexandru Moorer. Phone; [phone]

## 2021-09-11 NOTE — Assessment & Plan Note (Signed)
Asymptomatic from a cardiac standpoint.  Continue Eliquis.

## 2021-09-11 NOTE — H&P (Signed)
History and Physical    Patient: Martin Mullen IDP:[id number] DOB: 08-18-1980 DOA: 09/11/2021 DOS: the patient was seen and examined on 09/12/2021 PCP: [name], NP  Patient coming from: Home  Chief Complaint:  Chief Complaint  Patient presents with   Code Stroke   HPI: Martin Mullen is a 41 y.o. male with medical history significant of STEMI s/p RCA stent 2014, hypertension, recurrent strokes, heterozygous prothrombin gene mutation on Eliquis, insulin-dependent type 2 diabetes, hyperlipidemia cocaine use who presents with slurred speech.  Patient is somewhat of a limited historian.  He reports that at 3 AM yesterday he was talking with his mother's boyfriend when he has sudden onset of slurred speech.  He also felt like he had difficulty walking.  Brother at bedside reports that he has had some gradual delay in his speech for some time but this new baseline is different.  Patient reports he has not taken all of his medications for least 2 to 3 weeks due to financial reasons.  He reports smoking a pack per day.  Denies alcohol or illicit drug use.   MRI brain was revealing for 6 mm acute ischemic nonhemorrhagic left ventral pons/midbrain infarct.  CTA head and neck with no large vessel occlusion.  He was afebrile intermittently hypertensive with BP of 170 over 90s on room air.  No leukocytosis or anemia.  No significant electrolyte abnormalities other than hyperglycemia glucose of 266.   Review of Systems: All pertinent negatives and positives as above Past Medical History:  Diagnosis Date   Acute transmural inferior wall MI ([place]) 08/28/2012   .5 x 12 mm Veri-flex stent non-DES.   Chicken pox    Coronary artery disease    Inferior ST elevation myocardial infarction in July of 2014. Cardiac catheterization showed 95% distal RCA stenosis and 90% first diagonal stenosis with normal ejection fraction. He underwent PCI in bare-metal stent placement to the distal RCA.   Diabetes  mellitus without complication ([place])    Hypercholesterolemia    Stroke ([place])    Tobacco use    Past Surgical History:  Procedure Laterality Date   CORONARY ANGIOPLASTY WITH STENT PLACEMENT     DEBRIDEMENT AND CLOSURE WOUND N/A 09/03/2019   Procedure: Debridement and closure of scrotal wound;  Surgeon: Cindra Presume, MD;  Location: [facility];  Service: Plastics;  Laterality: N/A;   DEBRIDEMENT AND CLOSURE WOUND N/A 07/25/2019   Procedure: DEBRIDEMENT AND CLOSURE WOUND;  Surgeon: Cindra Presume, MD;  Location: [facility];  Service: Plastics;  Laterality: N/A;   INCISION AND DRAINAGE ABSCESS N/A 07/20/2019   Procedure: INCISION AND DRAINAGE ABSCESS;  Surgeon: Lucas Mallow, MD;  Location: [facility];  Service: Urology;  Laterality: N/A;   LEFT HEART CATHETERIZATION WITH CORONARY ANGIOGRAM N/A 08/28/2012   Procedure: LEFT HEART CATHETERIZATION WITH CORONARY ANGIOGRAM;  Surgeon: Laverda Page, MD;  Location: [facility] CATH LAB;  Service: Cardiovascular;  Laterality: N/A;   TEE WITHOUT CARDIOVERSION N/A 09/23/2020   Procedure: TRANSESOPHAGEAL ECHOCARDIOGRAM (TEE);  Surgeon: Corey Skains, MD;  Location: [facility];  Service: Cardiovascular;  Laterality: N/A;   WRIST SURGERY  1988   Social History:  reports that he has been smoking cigarettes. He has a 30.00 pack-year smoking history. He has never used smokeless tobacco. He reports that he does not currently use alcohol. He reports that he does not currently use drugs after having used the following drugs: Marijuana.  No Known Allergies  Family History  Problem Relation Age of Onset   Arthritis Mother        Rheumatoid   Diabetes Mother    Hyperlipidemia Mother    Diabetes Father    Cancer Maternal Grandfather        Prostate   Parkinson's disease Maternal Grandfather     Prior to Admission medications   Medication Sig Start Date End Date Taking? Authorizing Provider  apixaban (ELIQUIS) 5 MG TABS tablet Take 1 tablet (5 mg  total) by mouth 2 (two) times daily. 08/05/21   [name], Regina W, NP  aspirin EC 81 MG tablet Take 1 tablet (81 mg total) by mouth daily. 08/05/21   [name], Regina W, NP  Blood Glucose Monitoring Suppl (BAYER CONTOUR NEXT MONITOR) w/Device KIT 1 Device 07/22/15   [name], Regina W, NP  buPROPion (WELLBUTRIN XL) 150 MG 24 hr tablet Take 1 tablet (150 mg total) by mouth daily. 08/05/21   [name], Regina W, NP  CONTOUR NEXT TEST test strip USE 1 STRIP TO CHECK GLUCOSE THREE TIMES DAILY AS NEEDED 09/05/18   [name], Regina W, NP  insulin glargine (LANTUS SOLOSTAR) 100 UNIT/ML Solostar Pen Inject 15 Units into the skin at bedtime. ADMINISTER 23 UNITS UNDER THE SKIN EVERY DAY 08/05/21   [name], Regina W, NP  losartan (COZAAR) 50 MG tablet Take 1 tablet (50 mg total) by mouth daily. 08/05/21   [name], Regina W, NP  metFORMIN (GLUCOPHAGE) 1000 MG tablet TAKE 1 TABLET(1000 MG) BY MOUTH TWICE DAILY WITH A MEAL 08/05/21   [name], Regina W, NP  Multiple Vitamin (MULTIVITAMIN WITH MINERALS) TABS tablet Take 1 tablet by mouth in the morning. One-A-Day Multivitamin    [provider]  nitroGLYCERIN (NITROSTAT) 0.4 MG SL tablet DISSOLVE ONE TABLET UNDER THE TONGUE EVERY 5 MINUTES AS NEEDED FOR CHEST PAIN.  DO NOT EXCEED A TOTAL OF 3 DOSES IN 15 MINUTES 11/13/19   [name], Regina W, NP  Omega-3 Fatty Acids (FISH OIL) 1200 MG CAPS Take 1 capsule (1,200 mg total) by mouth in the morning and at bedtime. 08/05/21   [name], Regina W, NP  rosuvastatin (CRESTOR) 10 MG tablet Take 1 tablet (10 mg total) by mouth daily. 08/05/21   [name], Regina W, NP    Physical Exam: Vitals:   09/11/21 2200 09/11/21 2315 09/12/21 0000 09/12/21 0008  BP: 115/78 95/70 116/79   Pulse: 78 68 85   Resp: 16 14 12   Temp:  98.3 F (36.8 C) 98.2 F (36.8 C)   TempSrc:  Oral Oral Oral  SpO2: 96% 95% 98%   Weight:      Height:       Constitutional: NAD, calm, comfortable, disheveled appearing young male laying flat in bed with slow and delayed  speech. Eyes:  lids and conjunctivae normal ENMT: Mucous membranes are moist.  Neck: normal, supple Respiratory: clear to auscultation bilaterally, no wheezing, no crackles. Normal respiratory effort. No accessory muscle use.  Patient had an episode of coughing with difficulty clearing secretions. Cardiovascular: Regular rate and rhythm, no murmurs / rubs / gallops. No extremity edema. Abdomen: Soft, nontender nondistended.  Bowel sounds positive.  Musculoskeletal: no clubbing / cyanosis. No joint deformity upper and lower extremities. Good ROM, no contractures. Normal muscle tone.  Skin: no rashes, lesions, ulcers.  Neurologic: CN 2-12 grossly intact.  Strength 5/5 in all 4.  Patient had slow and delayed speech. Psychiatric:  Alert and oriented x 3.  Flat affect.   Data Reviewed:  See HPI  Assessment   and Plan: * Stroke ([place]) -6 mm acute ischemic non-hemorrhagic left ventral pons/midbrain Infarct. -Patient has significant risk factors with recurrent strokes, uncontrolled type 2 diabetes, hypertension, hyperlipidemia and prothrombin gene mutation.  Has been off of his Eliquis for several weeks due to financial reasons.  He was previously followed by [facility] for recurrent strokes and reportedly they had plans for Holter monitoring and if no atrial fibrillation is found to ultimately have loop recorder placement. Does not appear he had this procedure done.Appreciate neurology recommendation on this.  -check UDS - obtain echocardiogram  - resume Eliquis  -Obtain A1c and lipids -PT/OT/SLT -Frequent neuro checks and keep on telemetry -Allow for permissive hypertension with blood pressure treatment as needed only if systolic goes above 161  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 [facility]) Patient has been evaluated by hematology in the past.  They advise keeping on Eliquis indefinitely in the past pending further further work-up to assess if he has atrial fibrillation.  Type 2 diabetes  mellitus with hyperlipidemia (HCC) Uncontrolled.  Hemoglobin A1c of greater than 14 in 07/2021. Start resistant SSI.   HTN (hypertension) Allowing for permissive hypertension  STEMI s/p RCA stent 08/2012 Asymptomatic from a cardiac standpoint.  Continue Eliquis.      Advance Care Planning:   Code Status: Full Code Full  Consults: neurology  Family Communication: Discussed with brother and nephew at bedside  Severity of Illness: The appropriate patient status for this patient is OBSERVATION. Observation status is judged to be reasonable and necessary in order to provide the required intensity of service to ensure the patient's safety. The patient's presenting symptoms, physical exam findings, and initial radiographic and laboratory data in the context of their medical condition is felt to place them at decreased risk for further clinical deterioration. Furthermore, it is anticipated that the patient will be medically stable for discharge from the hospital within 2 midnights of admission.   Author: Orene Desanctis, DO 09/12/2021 2:54 AM  For on call review [email or web address].

## 2021-09-11 NOTE — Assessment & Plan Note (Signed)
Patient has been evaluated by hematology in the past.  They advise keeping on Eliquis indefinitely in the past pending further further work-up to assess if he has atrial fibrillation.

## 2021-09-11 NOTE — ED Provider Notes (Addendum)
[facility] EMERGENCY DEPARTMENT Provider Note   CSN: [id number] Arrival date & time: 09/11/21  1712  An emergency department physician performed an initial assessment on this suspected stroke patient at 1713.  History  Chief Complaint  Patient presents with   Code Stroke    Martin Mullen is a 41 y.o. male history of prothrombin mutation, hyperlipidemia, diabetes, coronary artery disease, MI, history of stroke presenting for speech difficulties.  Patient was with family yesterday, they noticed that around 3 AM he began having slurred speech and stumbling.  Reports he has been not compliant with his anticoagulation medications as he cannot afford them.  Denies visual changes, chest pain, nausea, vomiting, headaches, weakness, numbness, tingling.  Symptoms constant.  No modifying factors.  HPI     Home Medications Prior to Admission medications   Medication Sig Start Date End Date Taking? Authorizing Provider  apixaban (ELIQUIS) 5 MG TABS tablet Take 1 tablet (5 mg total) by mouth 2 (two) times daily. 08/05/21  Yes [name], NP  aspirin EC 81 MG tablet Take 1 tablet (81 mg total) by mouth daily. 08/05/21  Yes [name], NP  buPROPion (WELLBUTRIN XL) 150 MG 24 hr tablet Take 1 tablet (150 mg total) by mouth daily. 08/05/21  Yes [name], [name], NP  losartan (COZAAR) 50 MG tablet Take 1 tablet (50 mg total) by mouth daily. 08/05/21  Yes [name], [name], NP  metFORMIN (GLUCOPHAGE) 1000 MG tablet TAKE 1 TABLET(1000 MG) BY MOUTH TWICE DAILY WITH A MEAL 08/05/21  Yes [name], [name], NP  Multiple Vitamin (MULTIVITAMIN WITH MINERALS) TABS tablet Take 1 tablet by mouth in the morning. One-A-Day Multivitamin   Yes [provider]  Omega-3 Fatty Acids (FISH OIL) 1200 MG CAPS Take 1 capsule (1,200 mg total) by mouth in the morning and at bedtime. 08/05/21  Yes [name], NP  rosuvastatin (CRESTOR) 10 MG tablet Take 1 tablet (10 mg total) by mouth daily.  08/05/21  Yes [name], NP  Blood Glucose Monitoring Suppl (BAYER CONTOUR NEXT MONITOR) w/Device KIT 1 Device 07/22/15   [name], NP  CONTOUR NEXT TEST test strip USE 1 STRIP TO CHECK GLUCOSE THREE TIMES DAILY AS NEEDED 09/05/18   [name], NP  insulin glargine (LANTUS SOLOSTAR) 100 UNIT/ML Solostar Pen Inject 15 Units into the skin at bedtime. ADMINISTER 23 UNITS UNDER THE SKIN EVERY DAY 08/05/21   [name], NP  nitroGLYCERIN (NITROSTAT) 0.4 MG SL tablet DISSOLVE ONE TABLET UNDER THE TONGUE EVERY 5 MINUTES AS NEEDED FOR CHEST PAIN.  DO NOT EXCEED A TOTAL OF 3 DOSES IN 15 MINUTES 11/13/19   [name], NP      Allergies    Patient has no known allergies.    Review of Systems   Review of Systems See HPI   Physical Exam Updated Vital Signs BP 115/78   Pulse 78   Temp 97.8 F (36.6 C) (Oral)   Resp 16   Ht 5' 10"  (1.778 m)   Wt 87.8 kg   SpO2 96%   BMI 27.77 kg/m  Physical Exam Vitals and nursing note reviewed.  Constitutional:      General: He is not in acute distress.    Appearance: Normal appearance. He is diaphoretic.  HENT:     Mouth/Throat:     Mouth: Mucous membranes are moist.  Cardiovascular:     Rate and Rhythm: Normal rate and regular rhythm.  Pulmonary:     Effort: Pulmonary effort is normal. No respiratory distress.     Breath sounds: Normal breath sounds.  Abdominal:     General: Abdomen is flat.     Palpations: Abdomen is soft.     Tenderness: There is no abdominal tenderness.  Skin:    General: Skin is warm.     Capillary Refill: Capillary refill takes less than 2 seconds.  Neurological:     Mental Status: He is alert and oriented to person, place, and time.     Comments: Slightly slurred speech, otherwise cranial nerves II through XII intact, strength 5 out of 5 in the bilateral upper and lower extremities, no sensory deficit to light touch, no dysmetria on finger-nose-finger testing   Psychiatric:        Mood and Affect:  Mood normal.        Behavior: Behavior normal.     ED Results / Procedures / Treatments   Labs (all labs ordered are listed, but only abnormal results are displayed) Labs Reviewed  DIFFERENTIAL - Abnormal; Notable for the following components:      Result Value   Eosinophils Absolute 0.7 (*)    All other components within normal limits  COMPREHENSIVE METABOLIC PANEL - Abnormal; Notable for the following components:   Glucose, Bld 266 (*)    BUN 5 (*)    Calcium 8.5 (*)    Total Protein 6.0 (*)    Albumin 2.8 (*)    AST 9 (*)    All other components within normal limits  I-STAT CHEM 8, ED - Abnormal; Notable for the following components:   BUN 4 (*)    Creatinine, Ser 0.60 (*)    Glucose, Bld 266 (*)    Calcium, Ion 1.12 (*)    All other components within normal limits  CBG MONITORING, ED - Abnormal; Notable for the following components:   Glucose-Capillary 217 (*)    All other components within normal limits  PROTIME-INR  APTT  CBC  ETHANOL  RAPID URINE DRUG SCREEN, HOSP PERFORMED  HIV ANTIBODY (ROUTINE TESTING W REFLEX)  LIPID PANEL    EKG None  Radiology MR BRAIN WO CONTRAST  Result Date: 09/11/2021 CLINICAL DATA:  Initial evaluation for neuro deficit, stroke suspected. EXAM: MRI HEAD WITHOUT CONTRAST TECHNIQUE: Multiplanar, multiecho pulse sequences of the brain and surrounding structures were obtained without intravenous contrast. COMPARISON:  Prior CTs from earlier the same day. FINDINGS: Brain: Cerebral volume within normal limits. Scattered T2/FLAIR hyperintensity involving the supratentorial cerebral white matter, consistent with chronic small vessel ischemic disease. Few scatter remote lacunar infarcts present about the right corona radiata, basal ganglia, thalami, and pons. 6 mm focus of restricted diffusion at the left ventral pons/midbrain consistent with an acute ischemic infarct. No associated hemorrhage or mass effect. No other evidence for acute or  subacute ischemia. Gray-white matter differentiation otherwise maintained. No acute intracranial hemorrhage. No mass lesion, midline shift or mass effect. No hydrocephalus or extra-axial fluid collection. Pituitary gland and suprasellar region within normal limits. Vascular: Major intracranial vascular flow voids are maintained. Skull and upper cervical spine: Craniocervical junction normal. Bone marrow signal intensity within normal limits. No scalp soft tissue abnormality. Sinuses/Orbits: Globes orbital soft tissues within normal limits. Scattered mucosal thickening noted about the ethmoidal air cells and maxillary sinuses. No mastoid effusion. Other: None. IMPRESSION: 1. 6 mm acute ischemic nonhemorrhagic left ventral pons/midbrain infarct. 2. Underlying chronic microvascular ischemic disease with multiple remote lacunar infarcts involving the right  corona radiata, basal ganglia, thalami, and pons. Electronically Signed   By: Jeannine Boga M.D.   On: 09/11/2021 19:27   CT ANGIO HEAD NECK W WO CM W PERF (CODE STROKE)  Result Date: 09/11/2021 CLINICAL DATA:  Code stroke. Slurred speech. Last known well 3 a.m. EXAM: CT ANGIOGRAPHY HEAD AND NECK CT PERFUSION BRAIN TECHNIQUE: Multidetector CT imaging of the head and neck was performed using the standard protocol during bolus administration of intravenous contrast. Multiplanar CT image reconstructions and MIPs were obtained to evaluate the vascular anatomy. Carotid stenosis measurements (when applicable) are obtained utilizing NASCET criteria, using the distal internal carotid diameter as the denominator. Multiphase CT imaging of the brain was performed following IV bolus contrast injection. Subsequent parametric perfusion maps were calculated using RAPID software. RADIATION DOSE REDUCTION: This exam was performed according to the departmental dose-optimization program which includes automated exposure control, adjustment of the mA and/or kV according to  patient size and/or use of iterative reconstruction technique. CONTRAST:  139m OMNIPAQUE IOHEXOL 350 MG/ML SOLN COMPARISON:  CT head without contrast 09/11/2021. MR head without contrast 08/26/2020. MRA head without contrast 08/27/2020 FINDINGS: CTA NECK FINDINGS Aortic arch: 3 vessel arch configuration is present. No significant atherosclerotic changes are present at the arch. No aneurysm or stenosis. Right carotid system: The right common carotid artery is within normal limits. Bifurcation is unremarkable. Cervical right ICA is normal. Left carotid system: The left common carotid artery is within normal limits. The bifurcation is unremarkable. Cervical left ICA is normal. Vertebral arteries: The left vertebral artery is dominant. Both vertebral arteries originate from the subclavian arteries without significant stenosis. No significant stenosis is present in either vertebral artery in the neck. Skeleton: Vertebral body heights and alignment are normal. No focal osseous lesions present. The patient is edentulous. Other neck: Soft tissues the neck are otherwise unremarkable. Salivary glands are within normal limits. Thyroid is normal. No significant adenopathy is present. No focal mucosal or submucosal lesions are present. Upper chest: Lung apices are clear. Thoracic inlet is within normal limits. Review of the MIP images confirms the above findings CTA HEAD FINDINGS Anterior circulation: The carotid arteries are within normal limits the skull base through the ICA termini. The A1 and M1 segments are normal. The anterior communicating artery is patent. MCA bifurcations are within normal limits bilaterally. The ACA and MCA branch vessels are normal. Posterior circulation: The left vertebral artery is the dominant vessel. The right vertebral artery terminates at the PICA. Left PICA origin is visualized and normal. The left vertebral artery becomes the basilar artery. Basilar artery is normal. Both posterior cerebral  arteries originate from basilar tip. The PCA branch vessels are within normal limits bilaterally. Venous sinuses: The dural sinuses are patent. The straight sinus deep cerebral veins are intact. Cortical veins are within normal limits. No significant vascular malformation is evident. Anatomic variants: None Review of the MIP images confirms the above findings CT Brain Perfusion Findings: The field of view is lower than typical protocol. Software could not calculate perfusion mass. IMPRESSION: 1. No emergent large vessel occlusion. 2. No significant stenosis in the neck or circle-of-Willis. 3. CT perfusion maps could not be calculated due to technical factors related to the field of view. The patient could be re-scanned for perfusion if clinically indicated. Electronically Signed   By: CSan MorelleM.D.   On: 09/11/2021 18:27   CT HEAD CODE STROKE WO CONTRAST  Result Date: 09/11/2021 CLINICAL DATA:  Code stroke. Slurred speech. Last known well  3 a.m. EXAM: CT HEAD WITHOUT CONTRAST TECHNIQUE: Contiguous axial images were obtained from the base of the skull through the vertex without intravenous contrast. RADIATION DOSE REDUCTION: This exam was performed according to the departmental dose-optimization program which includes automated exposure control, adjustment of the mA and/or kV according to patient size and/or use of iterative reconstruction technique. COMPARISON:  CT head without contrast 11/16/2020. MR head without contrast 08/26/2020 FINDINGS: Brain: Remote internal capsule lacunar infarcts noted bilaterally. Remote left thalamic lacunar infarct noted. Mild atrophy and diffuse white matter disease is otherwise stable. Basal ganglia are otherwise intact. Insular ribbon is normal. Acute or focal cortical abnormality is present. The ventricles are of normal size. No significant extraaxial fluid collection is present. The brainstem and cerebellum are within normal limits. Vascular: Atherosclerotic  calcifications are present within the cavernous internal carotid arteries bilaterally. No hyperdense vessel is present. Skull: Calvarium is intact. No focal lytic or blastic lesions are present. No significant extracranial soft tissue lesion is present. Sinuses/Orbits: The paranasal sinuses and mastoid air cells are clear. The globes and orbits are within normal limits. ASPECTS [facility] Stroke Program Early CT Score) - Ganglionic level infarction (caudate, lentiform nuclei, internal capsule, insula, M1-M3 cortex): 7/7 - Supraganglionic infarction (M4-M6 cortex): 3/3 Total score (0-10 with 10 being normal): 10/10 IMPRESSION: 1. No acute intracranial abnormality or significant interval change. 2. Stable atrophy and diffuse white matter disease. 3. Remote lacunar infarcts involving the internal capsule bilaterally and left thalamus. 4. Aspects 10/10 * The above was relayed via text pager to Dr. Leonel Ramsay on 09/11/2021 at 17:30 . Electronically Signed   By: San Morelle M.D.   On: 09/11/2021 17:31    Procedures Procedures    Medications Ordered in ED Medications  sodium chloride flush (NS) 0.9 % injection 3 mL (3 mLs Intravenous Not Given 09/11/21 1755)   stroke: early stages of recovery book (has no administration in time range)  acetaminophen (TYLENOL) tablet 650 mg (has no administration in time range)    Or  acetaminophen (TYLENOL) 160 MG/5ML solution 650 mg (has no administration in time range)    Or  acetaminophen (TYLENOL) suppository 650 mg (has no administration in time range)  insulin aspart (novoLOG) injection 0-20 Units (7 Units Subcutaneous Given 09/11/21 2147)  iohexol (OMNIPAQUE) 350 MG/ML injection 100 mL (100 mLs Intravenous Contrast Given 09/11/21 1744)    ED Course/ Medical Decision Making/ A&P Clinical Course as of 09/11/21 2221  Sat Sep 11, 2021  2220 ECG not transferred over yet, reviewed by me, mo acute ST or T wave changes concerning for acute ischemia.  [WS]     Clinical Course User Index [WS] Cristie Hem, MD                           Medical Decision Making Amount and/or Complexity of Data Reviewed Labs: ordered. Radiology: ordered.  Risk Decision regarding hospitalization.   41 year old presenting with slurred speech.  Patient has extensive history of stroke, cardiovascular disease.  Exam overall notable for slurred speech but without other large neurologic deficit.  Patient was a code stroke, seen by neurology on arrival, and obtained CT scan which is negative for acute stroke.  MRI was also obtained which was positive for acute stroke.  Labs otherwise notable for mild hyperglycemia.  No sign of intracranial bleeding.  No obvious toxic or metabolic encephalopathy to explain slurred speech.  Ethanol level negative.  Discussed with neurology, discussed with hospitalist.  I reviewed MRI independently which demonstrates acute infarct.  Stroke likely in the setting of patient not being able to afford his home medications.       Final Clinical Impression(s) / ED Diagnoses Final diagnoses:  Cerebral infarction, unspecified mechanism [facility])  Dysarthria    Rx / DC Orders ED Discharge Orders     None         Cristie Hem, MD 09/11/21 2220    Cristie Hem, MD 09/11/21 2221

## 2021-09-11 NOTE — Assessment & Plan Note (Addendum)
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 -Patient has significant risk factors with recurrent strokes, uncontrolled type 2 diabetes, hypertension, hyperlipidemia and prothrombin gene mutation.  Has been off of his Eliquis for several weeks due to financial reasons.  He was previously followed by [facility] for recurrent strokes and reportedly they had plans for Holter monitoring and if no atrial fibrillation is found to ultimately have loop recorder placement. Does not appear he had this procedure done.Appreciate neurology recommendation on this.  -check UDS - obtain echocardiogram  - resume Eliquis  -Obtain A1c and lipids -PT/OT/SLT -Frequent neuro checks and keep on telemetry -Allow for permissive hypertension with blood pressure treatment as needed only if systolic goes above 220

## 2021-09-11 NOTE — Code Documentation (Signed)
Stroke Response Nurse Documentation Code Documentation  Martin Mullen is a 41 y.o. male arriving to [facility]  via [facility] on 09/11/21 with past medical hx of MI, CAD, DM, HLD, CVA, factor II prothrombin, and current smoker. On Eliquis (apixaban) daily and ASA daily but endorses not taking. Code stroke was activated by EMS.   Patient from home where he was LKW at 0300 and now complaining of slurred speech .   Stroke team at the bedside on patient arrival. Labs drawn and patient cleared for CT by Dr. Suezanne Jacquet. Patient to CT with team. NIHSS 4, see documentation for details and code stroke times. Patient with bilateral leg weakness, right limb ataxia, and dysarthria  on exam. The following imaging was completed:  CT Head, CTA, and CTP. Patient is not a candidate for IV Thrombolytic due to being out of TNK time frame.   Care Plan: q2h neuro checks and VS.   Bedside handoff with ED RN Ma.    Ernest Pine Jeraline Marcinek  Rapid Response RN

## 2021-09-11 NOTE — Assessment & Plan Note (Signed)
Uncontrolled.  Hemoglobin A1c of greater than 14 in 07/2021. Start resistant SSI.

## 2021-09-11 NOTE — Consult Note (Incomplete)
Neurology Consultation  Reason for Consult: Code Stroke Referring Physician: Truett Mainland  CC: Slurred Speech  History is obtained from:Patient, Chart Review  HPI: Martin Mullen is a 41 y.o. male with a PMH of MI, CAD, DM, HLD, smoker, CVA in 2021 and 2022 with a concerning hypercoagulable work up for factor II prothrombin gene point mutation presenting with slurred speech. Notes from his PCP report that he is supposed to be on Eliquis, Rosuvastatin, Fish oil, lantus, metformin, cozaar, and aspirin. He states that he has not taken any of his medications for the last two weeks due to running out of them. We was seen 6 months ago at the [facility] ED for wound draining on his scrotum that has since healed. He was out on a boat yesterday with his step father and around 3am they noticed him slurring his speech and stumbling. He denies drinking alcohol or using illicit substances. Gluc 266, BP 138/84, Temp 98.1.He denies nausea, vomiting, headache, or diarrhea. He does state that he has not eaten today and on exam he is dysarthric and diaphoretic.  Stroke work up in done in 2022 included a TEE. EF was 60-65% with normal LV function and negative bubble study.   LKW: 0300 IV thrombolysis given?: no, outside of the window Premorbid modified Rankin scale (mRS): 0  ROS: Full ROS was performed and is negative except as noted in the HPI.   Past Medical History:  Diagnosis Date   Acute transmural inferior wall MI ([place]) 08/28/2012   .5 x 12 mm Veri-flex stent non-DES.   Chicken pox    Coronary artery disease    Inferior ST elevation myocardial infarction in July of 2014. Cardiac catheterization showed 95% distal RCA stenosis and 90% first diagonal stenosis with normal ejection fraction. He underwent PCI in bare-metal stent placement to the distal RCA.   Diabetes mellitus without complication ([place])    Hypercholesterolemia    Stroke ([place])    Tobacco use     Family History  Problem Relation Age of Onset    Arthritis Mother        Rheumatoid   Diabetes Mother    Hyperlipidemia Mother    Diabetes Father    Cancer Maternal Grandfather        Prostate   Parkinson's disease Maternal Grandfather    Social History:   reports that he has been smoking cigarettes. He has a 30.00 pack-year smoking history. He has never used smokeless tobacco. He reports that he does not currently use alcohol. He reports that he does not currently use drugs after having used the following drugs: Marijuana.  Medications  Current Facility-Administered Medications:    sodium chloride flush (NS) 0.9 % injection 3 mL, 3 mL, Intravenous, Once, Curatolo, Adam, DO  Current Outpatient Medications:    apixaban (ELIQUIS) 5 MG TABS tablet, Take 1 tablet (5 mg total) by mouth 2 (two) times daily., Disp: 180 tablet, Rfl: 0   aspirin EC 81 MG tablet, Take 1 tablet (81 mg total) by mouth daily., Disp: 90 tablet, Rfl: 0   Blood Glucose Monitoring Suppl (BAYER CONTOUR NEXT MONITOR) w/Device KIT, 1 Device, Disp: 1 kit, Rfl: 0   buPROPion (WELLBUTRIN XL) 150 MG 24 hr tablet, Take 1 tablet (150 mg total) by mouth daily., Disp: 90 tablet, Rfl: 0   CONTOUR NEXT TEST test strip, USE 1 STRIP TO CHECK GLUCOSE THREE TIMES DAILY AS NEEDED, Disp: 100 each, Rfl: 0   insulin glargine (LANTUS SOLOSTAR) 100 UNIT/ML Solostar Pen, Inject  15 Units into the skin at bedtime. ADMINISTER 23 UNITS UNDER THE SKIN EVERY DAY, Disp: 15 mL, Rfl: 0   losartan (COZAAR) 50 MG tablet, Take 1 tablet (50 mg total) by mouth daily., Disp: 90 tablet, Rfl: 0   metFORMIN (GLUCOPHAGE) 1000 MG tablet, TAKE 1 TABLET(1000 MG) BY MOUTH TWICE DAILY WITH A MEAL, Disp: 180 tablet, Rfl: 0   Multiple Vitamin (MULTIVITAMIN WITH MINERALS) TABS tablet, Take 1 tablet by mouth in the morning. One-A-Day Multivitamin, Disp: , Rfl:    nitroGLYCERIN (NITROSTAT) 0.4 MG SL tablet, DISSOLVE ONE TABLET UNDER THE TONGUE EVERY 5 MINUTES AS NEEDED FOR CHEST PAIN.  DO NOT EXCEED A TOTAL OF 3 DOSES  IN 15 MINUTES, Disp: 25 tablet, Rfl: 2   Omega-3 Fatty Acids (FISH OIL) 1200 MG CAPS, Take 1 capsule (1,200 mg total) by mouth in the morning and at bedtime., Disp: 360 capsule, Rfl: 0   rosuvastatin (CRESTOR) 10 MG tablet, Take 1 tablet (10 mg total) by mouth daily., Disp: 90 tablet, Rfl: 0  Exam: Current vital signs: There were no vitals taken for this visit. Vital signs in last 24 hours:    GENERAL: Awake, alert in NAD HEENT: - Normocephalic and atraumatic, dry mm, no LN++, no Thyromegally LUNGS - Clear to auscultation bilaterally with no wheezes CV - S1S2 RRR, no m/r/g, equal pulses bilaterally. ABDOMEN - Soft, nontender, nondistended with normoactive BS Ext: warm, well perfused, intact peripheral pulses, no edema  NEURO:  Mental Status: AA&Ox3  Language: speech is dysarthric and slowed.  Naming, repetition, fluency, and comprehension intact. Cranial Nerves: PERRL, EOMI, visual fields full, questionable left facial droop, facial sensation intact, hearing intact, tongue/uvula/soft palate midline, normal, sternocleidomastoid and trapezius muscle strength. No evidence of tongue atrophy or fibrillations Motor:  RUE 5/5 LUE 5/5 RLE 4/5 LLE 4/5 Tone: is normal and bulk is normal Sensation- Intact to light touch bilaterally Coordination: FTN intact bilaterally, no ataxia in BLE. Gait- deferred  NIHSS 1a Level of Conscious.: 0 1b LOC Questions: 0 1c LOC Commands: 0 2 Best Gaze: 0 3 Visual: 0 4 Facial Palsy: 0 5a Motor Arm - left: 0 5b Motor Arm - Right: 0 6a Motor Leg - Left: 1 6b Motor Leg - Right: 1 7 Limb Ataxia: 1 8 Sensory: 0 9 Best Language: 0 10 Dysarthria: 1 11 Extinct. and Inatten.: 0 TOTAL: 4   Labs I have reviewed labs in epic and the results pertinent to this consultation are:  CBC    Component Value Date/Time   WBC 13.3 (H) 08/11/2021 0937   WBC 12.1 (H) 12/23/2020 1340   RBC 5.51 08/11/2021 0937   RBC 5.04 12/23/2020 1340   HGB 17.9 (H) 08/11/2021  0937   HCT 51.0 08/11/2021 0937   PLT 261 08/11/2021 0937   MCV 93 08/11/2021 0937   MCV 93 01/29/2013 0040   MCH 32.5 08/11/2021 0937   MCH 31.9 12/23/2020 1340   MCHC 35.1 08/11/2021 0937   MCHC 35.3 12/23/2020 1340   RDW 12.4 08/11/2021 0937   RDW 13.8 01/29/2013 0040   LYMPHSABS 4.8 (H) 08/11/2021 0937   MONOABS 0.6 12/23/2020 1340   EOSABS 0.5 (H) 08/11/2021 0937   BASOSABS 0.1 08/11/2021 0937    CMP     Component Value Date/Time   NA 140 08/11/2021 0937   NA 139 01/29/2013 0040   K 4.0 08/11/2021 0937   K 4.0 01/29/2013 0040   CL 100 08/11/2021 0937   CL 107 01/29/2013 0040  CO2 22 08/11/2021 0937   CO2 28 01/29/2013 0040   GLUCOSE 234 (H) 08/11/2021 0937   GLUCOSE 145 (H) 12/23/2020 1340   GLUCOSE 228 (H) 01/29/2013 0040   BUN 11 08/11/2021 0937   BUN 15 01/29/2013 0040   CREATININE 1.02 08/11/2021 0937   CREATININE 0.91 12/07/2020 1037   CALCIUM 9.8 08/11/2021 0937   CALCIUM 9.2 01/29/2013 0040   PROT 6.7 08/11/2021 0937   ALBUMIN 4.2 08/11/2021 0937   AST 8 08/11/2021 0937   ALT 8 08/11/2021 0937   ALKPHOS 122 (H) 08/11/2021 0937   BILITOT 0.3 08/11/2021 0937   GFRNONAA >60 12/23/2020 1340   GFRNONAA >60 01/29/2013 0040   GFRAA 104 11/13/2019 1550   GFRAA >60 01/29/2013 0040    Lipid Panel     Component Value Date/Time   CHOL 159 08/11/2021 0937   TRIG 174 (H) 08/11/2021 0937   HDL 26 (L) 08/11/2021 0937   CHOLHDL 3.8 12/07/2020 1037   VLDL 36 08/27/2020 0720   LDLCALC 102 (H) 08/11/2021 0937   LDLCALC 53 12/07/2020 1037   LDLDIRECT 126.0 06/20/2017 1510     Imaging I have reviewed the images obtained:  CT-head- No acute intracranial abnormality or significant interval change. Stable atrophy and diffuse white matter disease. Remote lacunar infarcts involving the internal capsule bilaterally and left thalamus.  CTA Head and Neck-   MRI Pending  Assessment:  41 year old male MI, CAD, DM, HLD, smoker, CVA in 2021 and 2022 with a  concerning hypercoagulable work up for factor II prothrombin gene point mutation presenting with slurred speech, diaphoretic, bilateral lower extremity weakness. He has not taken his medications for about two weeks. Symptoms are non focal but given his history it is reasonable to get an MRI and optimize stroke risk factors. Presentation is also concerning for dehydration or sepsis  Impression: Acute ischemic infarct  dehydration  Sepsis DKA  Recommendations: - HgbA1c, fasting lipid panel - MRI brain without contrast - Echocardiogram - Risk factor modification - Telemetry monitoring - PT consult, OT consult, Speech consult   -- Patient seen and examined by NP/APP with MD. MD to update note as needed.   Janine Ores, DNP, FNP-BC [facility] Pager: [phone]

## 2021-09-11 NOTE — ED Notes (Signed)
Taken to MRI by transporter.  °

## 2021-09-12 ENCOUNTER — Inpatient Hospital Stay (HOSPITAL_COMMUNITY): Payer: Self-pay

## 2021-09-12 ENCOUNTER — Observation Stay (HOSPITAL_COMMUNITY): Payer: Self-pay

## 2021-09-12 DIAGNOSIS — G8191 Hemiplegia, unspecified affecting right dominant side: Secondary | ICD-10-CM | POA: Diagnosis present

## 2021-09-12 DIAGNOSIS — R471 Dysarthria and anarthria: Secondary | ICD-10-CM | POA: Diagnosis present

## 2021-09-12 DIAGNOSIS — Z794 Long term (current) use of insulin: Secondary | ICD-10-CM | POA: Diagnosis not present

## 2021-09-12 DIAGNOSIS — I639 Cerebral infarction, unspecified: Secondary | ICD-10-CM | POA: Diagnosis present

## 2021-09-12 DIAGNOSIS — Z833 Family history of diabetes mellitus: Secondary | ICD-10-CM | POA: Diagnosis not present

## 2021-09-12 DIAGNOSIS — D6852 Prothrombin gene mutation: Secondary | ICD-10-CM | POA: Diagnosis present

## 2021-09-12 DIAGNOSIS — I252 Old myocardial infarction: Secondary | ICD-10-CM | POA: Diagnosis not present

## 2021-09-12 DIAGNOSIS — E1169 Type 2 diabetes mellitus with other specified complication: Secondary | ICD-10-CM | POA: Diagnosis present

## 2021-09-12 DIAGNOSIS — Z7901 Long term (current) use of anticoagulants: Secondary | ICD-10-CM | POA: Diagnosis not present

## 2021-09-12 DIAGNOSIS — Z8042 Family history of malignant neoplasm of prostate: Secondary | ICD-10-CM | POA: Diagnosis not present

## 2021-09-12 DIAGNOSIS — T45516A Underdosing of anticoagulants, initial encounter: Secondary | ICD-10-CM | POA: Diagnosis present

## 2021-09-12 DIAGNOSIS — I6389 Other cerebral infarction: Secondary | ICD-10-CM

## 2021-09-12 DIAGNOSIS — E7800 Pure hypercholesterolemia, unspecified: Secondary | ICD-10-CM | POA: Diagnosis present

## 2021-09-12 DIAGNOSIS — F17210 Nicotine dependence, cigarettes, uncomplicated: Secondary | ICD-10-CM | POA: Diagnosis present

## 2021-09-12 DIAGNOSIS — E876 Hypokalemia: Secondary | ICD-10-CM | POA: Diagnosis present

## 2021-09-12 DIAGNOSIS — I10 Essential (primary) hypertension: Secondary | ICD-10-CM | POA: Diagnosis present

## 2021-09-12 DIAGNOSIS — E860 Dehydration: Secondary | ICD-10-CM | POA: Diagnosis present

## 2021-09-12 DIAGNOSIS — Z8261 Family history of arthritis: Secondary | ICD-10-CM | POA: Diagnosis not present

## 2021-09-12 DIAGNOSIS — I2510 Atherosclerotic heart disease of native coronary artery without angina pectoris: Secondary | ICD-10-CM | POA: Diagnosis present

## 2021-09-12 DIAGNOSIS — Z91128 Patient's intentional underdosing of medication regimen for other reason: Secondary | ICD-10-CM | POA: Diagnosis not present

## 2021-09-12 DIAGNOSIS — Z820 Family history of epilepsy and other diseases of the nervous system: Secondary | ICD-10-CM | POA: Diagnosis not present

## 2021-09-12 DIAGNOSIS — Y92009 Unspecified place in unspecified non-institutional (private) residence as the place of occurrence of the external cause: Secondary | ICD-10-CM | POA: Diagnosis not present

## 2021-09-12 DIAGNOSIS — E1165 Type 2 diabetes mellitus with hyperglycemia: Secondary | ICD-10-CM | POA: Diagnosis present

## 2021-09-12 DIAGNOSIS — I6329 Cerebral infarction due to unspecified occlusion or stenosis of other precerebral arteries: Secondary | ICD-10-CM | POA: Diagnosis present

## 2021-09-12 DIAGNOSIS — Z955 Presence of coronary angioplasty implant and graft: Secondary | ICD-10-CM | POA: Diagnosis not present

## 2021-09-12 DIAGNOSIS — R29704 NIHSS score 4: Secondary | ICD-10-CM | POA: Diagnosis present

## 2021-09-12 LAB — LIPID PANEL
Cholesterol: 124 mg/dL (ref 0–200)
HDL: 27 mg/dL — ABNORMAL LOW (ref >40)
LDL Cholesterol: 75 mg/dL (ref 0–99)
Total CHOL/HDL Ratio: 4.6 RATIO
Triglycerides: 112 mg/dL (ref <150)
VLDL: 22 mg/dL (ref 0–40)

## 2021-09-12 LAB — GLUCOSE, CAPILLARY
Glucose-Capillary: 114 mg/dL — ABNORMAL HIGH (ref 70–99)
Glucose-Capillary: 122 mg/dL — ABNORMAL HIGH (ref 70–99)
Glucose-Capillary: 163 mg/dL — ABNORMAL HIGH (ref 70–99)
Glucose-Capillary: 183 mg/dL — ABNORMAL HIGH (ref 70–99)
Glucose-Capillary: 231 mg/dL — ABNORMAL HIGH (ref 70–99)

## 2021-09-12 LAB — ECHOCARDIOGRAM COMPLETE
Area-P 1/2: 3.85 cm2
Height: 70 in
S' Lateral: 2.6 cm
Weight: 3097.02 oz

## 2021-09-12 LAB — HIV ANTIBODY (ROUTINE TESTING W REFLEX): HIV Screen 4th Generation wRfx: NONREACTIVE

## 2021-09-12 MED ORDER — APIXABAN 5 MG PO TABS
5.0000 mg | ORAL_TABLET | Freq: Two times a day (BID) | ORAL | Status: DC
  Administered 2021-09-12 – 2021-09-28 (×33): 5 mg via ORAL
  Filled 2021-09-12 (×34): qty 1

## 2021-09-12 MED ORDER — ROSUVASTATIN CALCIUM 5 MG PO TABS
10.0000 mg | ORAL_TABLET | Freq: Every day | ORAL | Status: DC
  Administered 2021-09-12: 10 mg via ORAL
  Filled 2021-09-12: qty 2

## 2021-09-12 MED ORDER — ROSUVASTATIN CALCIUM 20 MG PO TABS
20.0000 mg | ORAL_TABLET | Freq: Every day | ORAL | Status: DC
  Administered 2021-09-13 – 2021-09-28 (×16): 20 mg via ORAL
  Filled 2021-09-12 (×17): qty 1

## 2021-09-12 MED ORDER — ASPIRIN 81 MG PO TBEC
81.0000 mg | DELAYED_RELEASE_TABLET | Freq: Every day | ORAL | Status: DC
  Administered 2021-09-12 – 2021-09-28 (×17): 81 mg via ORAL
  Filled 2021-09-12 (×17): qty 1

## 2021-09-12 MED ORDER — FOOD THICKENER (SIMPLYTHICK)
10.0000 | ORAL | Status: DC | PRN
Start: 2021-09-12 — End: 2021-09-28

## 2021-09-12 MED ORDER — LACTATED RINGERS IV SOLN: INTRAVENOUS | Status: AC

## 2021-09-12 NOTE — Progress Notes (Signed)
  Echocardiogram 2D Echocardiogram has been performed.  Delcie Roch 09/12/2021, 8:57 AM

## 2021-09-12 NOTE — Evaluation (Signed)
Clinical/Bedside Swallow Evaluation Patient Details  Name: Martin Mullen MRN: [id number] Date of Birth: 10/01/1980  Today's Date: 09/12/2021 Time: SLP Start Time (ACUTE ONLY): 1155 SLP Stop Time (ACUTE ONLY): 1210 SLP Time Calculation (min) (ACUTE ONLY): 15 min  Past Medical History:  Past Medical History:  Diagnosis Date   Acute transmural inferior wall MI ([facility]) 08/28/2012   .5 x 12 mm Veri-flex stent non-DES.   Chicken pox    Coronary artery disease    Inferior ST elevation myocardial infarction in July of 2014. Cardiac catheterization showed 95% distal RCA stenosis and 90% first diagonal stenosis with normal ejection fraction. He underwent PCI in bare-metal stent placement to the distal RCA.   Diabetes mellitus without complication ([facility])    Hypercholesterolemia    Stroke ([facility])    Tobacco use    Past Surgical History:  Past Surgical History:  Procedure Laterality Date   CORONARY ANGIOPLASTY WITH STENT PLACEMENT     DEBRIDEMENT AND CLOSURE WOUND N/A 09/03/2019   Procedure: Debridement and closure of scrotal wound;  Surgeon: Allena Napoleon, MD;  Location: [facility];  Service: Plastics;  Laterality: N/A;   DEBRIDEMENT AND CLOSURE WOUND N/A 07/25/2019   Procedure: DEBRIDEMENT AND CLOSURE WOUND;  Surgeon: Allena Napoleon, MD;  Location: [facility];  Service: Plastics;  Laterality: N/A;   INCISION AND DRAINAGE ABSCESS N/A 07/20/2019   Procedure: INCISION AND DRAINAGE ABSCESS;  Surgeon: Crista Elliot, MD;  Location: [facility];  Service: Urology;  Laterality: N/A;   LEFT HEART CATHETERIZATION WITH CORONARY ANGIOGRAM N/A 08/28/2012   Procedure: LEFT HEART CATHETERIZATION WITH CORONARY ANGIOGRAM;  Surgeon: Pamella Pert, MD;  Location[facility];  Service: Cardiovascular;  Laterality: N/A;   TEE WITHOUT CARDIOVERSION N/A 09/23/2020   Procedure: TRANSESOPHAGEAL ECHOCARDIOGRAM (TEE);  Surgeon: Lamar Blinks, MD;  Location: [facility];  Service: Cardiovascular;   Laterality: N/A;   WRIST SURGERY  1988   HPI:  Patient is a 41 y.o. male with PMH: recurrent strokes, HTN, STEMI s/p RCA stent 2014, DM-2 insulin dependent, HLD, cocaine use. He presented to the hospital on 09/11/21 with slurred speech. MRI brain revealed 60mm acute ischemic nonhemmorhagic left ventral pons/midbrain infarct; CTA head and neck with no large vessel occlusion.    Assessment / Plan / Recommendation  Clinical Impression  Patient presents with clinical s/s of dysphagia as per this bedside/clinical swallow evaluation. He exhibited delayed cough with thin liquids (water) via cup sips and although he did not exhibit any immediate or delayed coughing with nectar thick liquids or puree solids, he did start exhibiting wet/gurgled voice which was observed both by SLP and Neurology NP who had entered room to assess. She reported to SLP that his wet voice sounded worse than it did previous date. As patient has had previous CVA's and currently exhibiting both impulsivity and delayed responses, he is at a high risk for aspiration. SLP recommending objective swallow study prior to diet recommendations. SLP Visit Diagnosis: Dysphagia, unspecified (R13.10)    Aspiration Risk  Moderate aspiration risk    Diet Recommendation NPO   Medication Administration: Via alternative means    Other  Recommendations Oral Care Recommendations: Oral care BID;Staff/trained caregiver to provide oral care    Recommendations for follow up therapy are one component of a multi-disciplinary discharge planning process, led by the attending physician.  Recommendations may be updated based on patient status, additional functional criteria and insurance authorization.  Follow up Recommendations Acute inpatient rehab (3hours/day)  Assistance Recommended at Discharge Frequent or constant Supervision/Assistance  Functional Status Assessment Patient has had a recent decline in their functional status and demonstrates the  ability to make significant improvements in function in a reasonable and predictable amount of time.  Frequency and Duration min 2x/week  2 weeks       Prognosis Prognosis for Safe Diet Advancement: Good Barriers to Reach Goals: Cognitive deficits      Swallow Study   General Date of Onset: 09/11/21 HPI: Patient is a 41 y.o. male with PMH: recurrent strokes, HTN, STEMI s/p RCA stent 2014, DM-2 insulin dependent, HLD, cocaine use. He presented to the hospital on 09/11/21 with slurred speech. MRI brain revealed 40mm acute ischemic nonhemmorhagic left ventral pons/midbrain infarct; CTA head and neck with no large vessel occlusion. Type of Study: Bedside Swallow Evaluation Previous Swallow Assessment: none found Diet Prior to this Study: NPO Temperature Spikes Noted: No Respiratory Status: Room air History of Recent Intubation: No Behavior/Cognition: Alert;Cooperative;Requires cueing;Impulsive Oral Cavity Assessment: Dry Oral Care Completed by SLP: Yes Oral Cavity - Dentition: Edentulous Vision: Functional for self-feeding Self-Feeding Abilities: Needs assist;Needs set up;Able to feed self Patient Positioning: Upright in bed Baseline Vocal Quality: Low vocal intensity;Normal Volitional Cough: Weak Volitional Swallow: Able to elicit    Oral/Motor/Sensory Function Overall Oral Motor/Sensory Function: Moderate impairment Facial ROM: Reduced right;Suspected CN VII (facial) dysfunction Facial Symmetry: Abnormal symmetry right;Suspected CN VII (facial) dysfunction Facial Strength: Reduced right Lingual Strength: Reduced Mandible: Impaired   Ice Chips     Thin Liquid Thin Liquid: Impaired Presentation: Cup Pharyngeal  Phase Impairments: Cough - Delayed    Nectar Thick Nectar Thick Liquid: Impaired Presentation: Cup Pharyngeal Phase Impairments: Wet Vocal Quality;Throat Clearing - Delayed   Honey Thick Honey Thick Liquid: Not tested   Puree Puree: Impaired Pharyngeal Phase  Impairments: Wet Vocal Quality;Throat Clearing - Delayed   Solid     Solid: Not tested     Angela Nevin, MA, CCC-SLP Speech Therapy

## 2021-09-12 NOTE — Progress Notes (Addendum)
STROKE TEAM PROGRESS NOTE   INTERVAL HISTORY SLP at the bedside, initially okay eating applesauce, but over time had some gurgling and coughing. Plan for modified barium swallow prior to initiating a diet.  States he was not taking his medications for the past 2 weeks due to financial concerns.   Vitals:   09/11/21 2315 09/12/21 0000 09/12/21 0008 09/12/21 0352  BP: 95/70 116/79  109/84  Pulse: 68 85  81  Resp: 14 12  15   Temp: 98.3 F (36.8 C) 98.2 F (36.8 C)  98.5 F (36.9 C)  TempSrc: Oral Oral Oral Oral  SpO2: 95% 98%  98%  Weight:      Height:       CBC:  Recent Labs  Lab 09/11/21 1719 09/11/21 1729  WBC 8.1  --   NEUTROABS 3.9  --   HGB 14.9 14.3  HCT 42.6 42.0  MCV 90.4  --   PLT 213  --    Basic Metabolic Panel:  Recent Labs  Lab 09/11/21 1719 09/11/21 1729  NA 137 138  K 3.6 3.6  CL 106 103  CO2 23  --   GLUCOSE 266* 266*  BUN 5* 4*  CREATININE 0.76 0.60*  CALCIUM 8.5*  --    Lipid Panel:  Recent Labs  Lab 09/12/21 0419  CHOL 124  TRIG 112  HDL 27*  CHOLHDL 4.6  VLDL 22  LDLCALC 75   HgbA1c: No results for input(s): "HGBA1C" in the last 168 hours. Urine Drug Screen: No results for input(s): "LABOPIA", "COCAINSCRNUR", "LABBENZ", "AMPHETMU", "THCU", "LABBARB" in the last 168 hours.  Alcohol Level  Recent Labs  Lab 09/11/21 1719  ETH <10    IMAGING past 24 hours MR BRAIN WO CONTRAST  Result Date: 09/11/2021 CLINICAL DATA:  Initial evaluation for neuro deficit, stroke suspected. EXAM: MRI HEAD WITHOUT CONTRAST TECHNIQUE: Multiplanar, multiecho pulse sequences of the brain and surrounding structures were obtained without intravenous contrast. COMPARISON:  Prior CTs from earlier the same day. FINDINGS: Brain: Cerebral volume within normal limits. Scattered T2/FLAIR hyperintensity involving the supratentorial cerebral white matter, consistent with chronic small vessel ischemic disease. Few scatter remote lacunar infarcts present about the right  corona radiata, basal ganglia, thalami, and pons. 6 mm focus of restricted diffusion at the left ventral pons/midbrain consistent with an acute ischemic infarct. No associated hemorrhage or mass effect. No other evidence for acute or subacute ischemia. Gray-white matter differentiation otherwise maintained. No acute intracranial hemorrhage. No mass lesion, midline shift or mass effect. No hydrocephalus or extra-axial fluid collection. Pituitary gland and suprasellar region within normal limits. Vascular: Major intracranial vascular flow voids are maintained. Skull and upper cervical spine: Craniocervical junction normal. Bone marrow signal intensity within normal limits. No scalp soft tissue abnormality. Sinuses/Orbits: Globes orbital soft tissues within normal limits. Scattered mucosal thickening noted about the ethmoidal air cells and maxillary sinuses. No mastoid effusion. Other: None. IMPRESSION: 1. 6 mm acute ischemic nonhemorrhagic left ventral pons/midbrain infarct. 2. Underlying chronic microvascular ischemic disease with multiple remote lacunar infarcts involving the right corona radiata, basal ganglia, thalami, and pons. Electronically Signed   By: 09/13/2021 M.D.   On: 09/11/2021 19:27   CT ANGIO HEAD NECK W WO CM W PERF (CODE STROKE)  Result Date: 09/11/2021 CLINICAL DATA:  Code stroke. Slurred speech. Last known well 3 a.m. EXAM: CT ANGIOGRAPHY HEAD AND NECK CT PERFUSION BRAIN TECHNIQUE: Multidetector CT imaging of the head and neck was performed using the standard protocol during bolus administration  of intravenous contrast. Multiplanar CT image reconstructions and MIPs were obtained to evaluate the vascular anatomy. Carotid stenosis measurements (when applicable) are obtained utilizing NASCET criteria, using the distal internal carotid diameter as the denominator. Multiphase CT imaging of the brain was performed following IV bolus contrast injection. Subsequent parametric perfusion maps  were calculated using RAPID software. RADIATION DOSE REDUCTION: This exam was performed according to the departmental dose-optimization program which includes automated exposure control, adjustment of the mA and/or kV according to patient size and/or use of iterative reconstruction technique. CONTRAST:  OMNIPAQUE IOHEXOL 350 MG/ML SOLN COMPARISON:  CT head without contrast 09/11/2021. MR head without contrast 08/26/2020. MRA head without contrast 08/27/2020 FINDINGS: CTA NECK FINDINGS Aortic arch: 3 vessel arch configuration is present. No significant atherosclerotic changes are present at the arch. No aneurysm or stenosis. Right carotid system: The right common carotid artery is within normal limits. Bifurcation is unremarkable. Cervical right ICA is normal. Left carotid system: The left common carotid artery is within normal limits. The bifurcation is unremarkable. Cervical left ICA is normal. Vertebral arteries: The left vertebral artery is dominant. Both vertebral arteries originate from the subclavian arteries without significant stenosis. No significant stenosis is present in either vertebral artery in the neck. Skeleton: Vertebral body heights and alignment are normal. No focal osseous lesions present. The patient is edentulous. Other neck: Soft tissues the neck are otherwise unremarkable. Salivary glands are within normal limits. Thyroid is normal. No significant adenopathy is present. No focal mucosal or submucosal lesions are present. Upper chest: Lung apices are clear. Thoracic inlet is within normal limits. Review of the MIP images confirms the above findings CTA HEAD FINDINGS Anterior circulation: The carotid arteries are within normal limits the skull base through the ICA termini. The A1 and M1 segments are normal. The anterior communicating artery is patent. MCA bifurcations are within normal limits bilaterally. The ACA and MCA branch vessels are normal. Posterior circulation: The left vertebral  artery is the dominant vessel. The right vertebral artery terminates at the PICA. Left PICA origin is visualized and normal. The left vertebral artery becomes the basilar artery. Basilar artery is normal. Both posterior cerebral arteries originate from basilar tip. The PCA branch vessels are within normal limits bilaterally. Venous sinuses: The dural sinuses are patent. The straight sinus deep cerebral veins are intact. Cortical veins are within normal limits. No significant vascular malformation is evident. Anatomic variants: None Review of the MIP images confirms the above findings CT Brain Perfusion Findings: The field of view is lower than typical protocol. Software could not calculate perfusion mass. IMPRESSION: 1. No emergent large vessel occlusion. 2. No significant stenosis in the neck or circle-of-Willis. 3. CT perfusion maps could not be calculated due to technical factors related to the field of view. The patient could be re-scanned for perfusion if clinically indicated. Electronically Signed   By: Marin Roberts M.D.   On: 09/11/2021 18:27   CT HEAD CODE STROKE WO CONTRAST  Result Date: 09/11/2021 CLINICAL DATA:  Code stroke. Slurred speech. Last known well 3 a.m. EXAM: CT HEAD WITHOUT CONTRAST TECHNIQUE: Contiguous axial images were obtained from the base of the skull through the vertex without intravenous contrast. RADIATION DOSE REDUCTION: This exam was performed according to the departmental dose-optimization program which includes automated exposure control, adjustment of the mA and/or kV according to patient size and/or use of iterative reconstruction technique. COMPARISON:  CT head without contrast 11/16/2020. MR head without contrast 08/26/2020 FINDINGS: Brain: Remote internal capsule lacunar  infarcts noted bilaterally. Remote left thalamic lacunar infarct noted. Mild atrophy and diffuse white matter disease is otherwise stable. Basal ganglia are otherwise intact. Insular ribbon is  normal. Acute or focal cortical abnormality is present. The ventricles are of normal size. No significant extraaxial fluid collection is present. The brainstem and cerebellum are within normal limits. Vascular: Atherosclerotic calcifications are present within the cavernous internal carotid arteries bilaterally. No hyperdense vessel is present. Skull: Calvarium is intact. No focal lytic or blastic lesions are present. No significant extracranial soft tissue lesion is present. Sinuses/Orbits: The paranasal sinuses and mastoid air cells are clear. The globes and orbits are within normal limits. ASPECTS [facility] Stroke Program Early CT Score) - Ganglionic level infarction (caudate, lentiform nuclei, internal capsule, insula, M1-M3 cortex): 7/7 - Supraganglionic infarction (M4-M6 cortex): 3/3 Total score (0-10 with 10 being normal): 10/10 IMPRESSION: 1. No acute intracranial abnormality or significant interval change. 2. Stable atrophy and diffuse white matter disease. 3. Remote lacunar infarcts involving the internal capsule bilaterally and left thalamus. 4. Aspects 10/10 * The above was relayed via text pager to Dr. Amada Jupiter on 09/11/2021 at 17:30 . Electronically Signed   By: Marin Roberts M.D.   On: 09/11/2021 17:31    PHYSICAL EXAM GENERAL: Awake, alert in NAD HEENT: - Normocephalic and atraumatic, dry mm, no LN++, no Thyromegally LUNGS - Clear to auscultation bilaterally with no wheezes CV - S1S2 RRR, no m/r/g, equal pulses bilaterally. ABDOMEN - Soft, nontender, nondistended with normoactive BS Ext: warm, well perfused, intact peripheral pulses, no edema   NEURO:  Mental Status: AA&Ox3  Language: speech is dysarthric and slowed.  Naming, repetition, fluency, and comprehension intact. Cranial Nerves: PERRL, EOMI, right eyelid droop, visual fields full, questionable right facial droop, facial sensation intact, hearing intact, tongue/uvula/soft palate midline, normal, sternocleidomastoid and  trapezius muscle strength. No evidence of tongue atrophy or fibrillations Motor:  RUE 4/5- with drift          LUE 5/5 RLE 5/5            LLE 5/5 Tone: is normal and bulk is normal Sensation- Intact to light touch bilaterally Coordination: slowed movements in RUE, no ataxia in BLE. Gait- deferred    ASSESSMENT/PLAN Martin Mullen is a 41 y.o. male with history of MI, CAD, DM, HLD, smoker, CVA in 2021 and 2022 with a concerning hypercoagulable work up for factor II prothrombin gene point mutation presenting with slurred speech. Notes from his PCP report that he is supposed to be on Eliquis, Rosuvastatin, Fish oil, lantus, metformin, cozaar, and aspirin. He states that he has not taken any of his medications for the last two weeks due to running out of them. We was seen 6 months ago at the [facility] ED for wound draining on his scrotum that has since healed. He was out on a boat yesterday with his step father and around 3am they noticed him slurring his speech and stumbling. He denies drinking alcohol or using illicit substances. Gluc 266, BP 138/84, Temp 98.1.He denies nausea, vomiting, headache, or diarrhea. He does state that he has not eaten today and on exam he is dysarthric and diaphoretic.  Stroke work up in done in 2022 included a TEE. EF was 60-65% with normal LV function and negative bubble study.   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 Stable atrophy. ASPECTS 10.    CTA head & neck-  no LVO, no significant stenosis in the neck or circle-of-Willis. CT perfusion- CT perfusion maps could not be calculated due to technical factors related to the field of view.  MRI- 1. 6 mm acute ischemic nonhemorrhagic left ventral pons/midbrain infarct. Multiple remote lacunar infarcts involving the right corona radiata, basal ganglia, thalami, and pons. 2D Echo 60-65% LDL 75 HgbA1c >14 VTE  prophylaxis - Eliquis 5mg  BID    Diet   Diet NPO time specified   No antithrombotic prior to admission, now on Eliquis (apixaban) daily.  Resumed eliquis 07/2021 and then stopped it 2 weeks ago Therapy recommendations:  CIR Disposition:  pending  Hypertension Home meds:  losartan  Stable Permissive hypertension (OK if < 220/120) but gradually normalize in 5-7 days Long-term BP goal normotensive  Hyperlipidemia Home meds:  rosuvastatin 10mg , resumed in hospital LDL 75, goal < 70 Increase to 20mg   Continue statin at discharge  Diabetes type II Uncontrolled Home meds:  Metformin 1000mg  BID HgbA1c >14, goal < 7.0 CBGs Recent Labs    09/11/21 2140 09/12/21 0016  GLUCAP 217* 122*    SSI  Other Stroke Risk Factors Cigarette smoker, advised to stop smoking Hx stroke/TIA 2021- left thalamic infarction along with subtle right thalamic infarction  2022- right basal ganglia/internal capsular infarct and posterior left frontal cortex infarct TEE negative, hypercoag panel positive- put on Eliquis 5mg  BID  Other Active Problems Dysphagia SLP to do MBS- currently NPO  Hospital day # 0  Patient seen and examined by NP/APP with MD. MD to update note as needed.   09/13/21, DNP, FNP-BC [facility] Pager: [phone]  41 year old with recurrent stroke due to noncompliance.  Resume Eliquis for stroke prevention.  Will need inpatient rehab. Neurology will sign off please call with questions.  ATTENDING ATTESTATION:  Dr. Elmer Picker evaluated pt independently, reviewed imaging, chart, labs. Discussed and formulated plan with the APP. Please see APP note above for details.   Total 36 minutes spent on counseling patient and coordinating care, writing notes and reviewing chart.  Martin Ackerley,MD    To contact Stroke Continuity provider, please refer to 100. After hours, contact General Neurology

## 2021-09-12 NOTE — Assessment & Plan Note (Signed)
-  Allowing for permissive hypertension 

## 2021-09-12 NOTE — Progress Notes (Addendum)
RN asked pt if he felt like he needed to void; he said yes, bladder scan shows 724 ml. Paged MD  Order obtained for straight cath; resulted in output of 1275 ml.   Order for PRN straight cath entered

## 2021-09-12 NOTE — Progress Notes (Signed)
PT Cancellation Note  Patient Details Name: Martin Mullen MRN: [id number] DOB: 1980/11/16   Cancelled Treatment:    Reason Eval/Treat Not Completed: Patient at procedure or test/unavailable  Will follow up later today as time allows;  Otherwise, will follow up for PT tomorrow;   Thank you,  Van Clines, PT  Acute Rehabilitation Services Office [phone]    Levi Aland 09/12/2021, 2:53 PM

## 2021-09-12 NOTE — Progress Notes (Signed)
PROGRESS NOTE                                                                                                                                                                                                             Patient Demographics:    Martin Mullen, is a 41 y.o. male, DOB - 07/20/1980, [id number]  Outpatient Primary MD for the patient is [name], NP    LOS - 0  Admit date - 09/11/2021    Chief Complaint  Patient presents with   Code Stroke       Brief Narrative (HPI from H&P) -  41 y.o. 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    MRI brain was revealing for 6 mm acute ischemic nonhemorrhagic left ventral pons/midbrain infarct.  CTA head and neck with no large vessel occlusion.   Subjective:    Martin Mullen today has, No headache, No chest pain, No abdominal pain - No Nausea, No new weakness tingling or numbness, no SOB   Assessment  & Plan :    Stroke   - 6 mm acute ischemic non-hemorrhagic left ventral pons/midbrain Infarct.  He has history of previous stroke, he also has history of prothrombin gene mutation, history of noncompliance with medications.  He does seem to have an acute stroke now, unsure whether he was taking his Eliquis prior to hospital admission, currently on combination of aspirin and Eliquis, Crestor also resumed.  A1c is greater than 14 LDL above goal.  Definitely has element of noncompliance with medications.  Full stroke work-up per neurology, echo pending.  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 [facility]) Patient has been evaluated by hematology in the past.  They advise keeping on Eliquis indefinitely.  HTN (hypertension) Allowing for permissive  hypertension  STEMI s/p RCA stent 08/2012 Asymptomatic from a cardiac standpoint.  Continue Eliquis and aspirin combination along with statin for secondary prevention, ARB held to allow for permissive hypertension..  Dehydration.  Hydrate with IV fluids.    Type 2 diabetes mellitus with hyperlipidemia (HCC) Uncontrolled.  Hemoglobin A1c of greater than 14 in 07/2021. Start resistant SSI.   CBG (last 3)  Recent Labs  09/11/21 2140 09/12/21 0016 09/12/21 0931  GLUCAP 217* 122* 231*           Condition - Extremely Guarded  Family Communication  :  friend Wyatt Mage - [phone]  - on 09/12/21  Code Status :  Full  Consults  :  Neuro  PUD Prophylaxis : Eliquis   Procedures  :     MRI - 1. 6 mm acute ischemic nonhemorrhagic left ventral pons/midbrain infarct. 2. Underlying chronic microvascular ischemic disease with multiple remote lacunar infarcts involving the right corona radiata, basal ganglia, thalami, and pons.  CTA - 1. No acute intracranial abnormality or significant interval change. 2. Stable atrophy and diffuse white matter disease. 3. Remote lacunar infarcts involving the internal capsule bilaterally and left thalamus. 4. Aspects 10/10   TTE -       Disposition Plan  :    Status is: Observation  DVT Prophylaxis  :     apixaban (ELIQUIS) tablet 5 mg   Lab Results  Component Value Date   PLT 213 09/11/2021    Diet :  Diet Order             Diet NPO time specified  Diet effective now                    Inpatient Medications  Scheduled Meds:   stroke: early stages of recovery book   Does not apply Once   apixaban  5 mg Oral BID   aspirin EC  81 mg Oral Daily   insulin aspart  0-20 Units Subcutaneous TID PC & HS   rosuvastatin  10 mg Oral Daily   sodium chloride flush  3 mL Intravenous Once   Continuous Infusions: PRN Meds:.acetaminophen **OR** acetaminophen (TYLENOL) oral liquid 160 mg/5 mL **OR** acetaminophen  Antibiotics  :     Anti-infectives (From admission, onward)    None        Time Spent in minutes  30   Susa Raring M.D on 09/12/2021 at 10:06 AM  To page go to [email or web address]   [facility] -  Office  [phone]  See all Orders from today for further details    Objective:   Vitals:   09/12/21 0000 09/12/21 0008 09/12/21 0352 09/12/21 0800  BP: 116/79  109/84   Pulse: 85  81   Resp: 12  15   Temp: 98.2 F (36.8 C)  98.5 F (36.9 C) 98.3 F (36.8 C)  TempSrc: Oral Oral Oral Oral  SpO2: 98%  98% 98%  Weight:      Height:        Wt Readings from Last 3 Encounters:  09/11/21 87.8 kg  08/05/21 88.5 kg  12/23/20 107 kg     Intake/Output Summary (Last 24 hours) at 09/12/2021 1006 Last data filed at 09/12/2021 0300 Gross per 24 hour  Intake --  Output 1275 ml  Net -1275 ml     Physical Exam  Awake, moving all 4 extremities to verbal command, appears very frail and dehydrated, answers questions with 1 or 2 word answers, does appear to have some dysarthria [place].AT,PERRAL Supple Neck, No JVD,   Symmetrical Chest wall movement, Good air movement bilaterally, CTAB RRR,No Gallops,Rubs or new Murmurs,  +ve B.Sounds, Abd Soft, No tenderness,   No Cyanosis, Clubbing or edema        Data Review:    CBC Recent Labs  Lab 09/11/21 1719 09/11/21 1729  WBC 8.1  --   HGB 14.9 14.3  HCT 42.6 42.0  PLT 213  --   MCV 90.4  --   MCH 31.6  --   MCHC 35.0  --   RDW 12.5  --   LYMPHSABS 3.0  --   MONOABS 0.5  --   EOSABS 0.7*  --   BASOSABS 0.1  --     Electrolytes Recent Labs  Lab 09/11/21 1719 09/11/21 1729  NA 137 138  K 3.6 3.6  CL 106 103  CO2 23  --   GLUCOSE 266* 266*  BUN 5* 4*  CREATININE 0.76 0.60*  CALCIUM 8.5*  --   AST 9*  --   ALT 6  --   ALKPHOS 101  --   BILITOT 0.3  --   ALBUMIN 2.8*  --   INR 1.0  --     ------------------------------------------------------------------------------------------------------------------ Recent Labs     09/12/21 0419  CHOL 124  HDL 27*  LDLCALC 75  TRIG 112  CHOLHDL 4.6    Lab Results  Component Value Date   HGBA1C >14 08/05/2021      Radiology Reports MR BRAIN WO CONTRAST  Result Date: 09/11/2021 CLINICAL DATA:  Initial evaluation for neuro deficit, stroke suspected. EXAM: MRI HEAD WITHOUT CONTRAST TECHNIQUE: Multiplanar, multiecho pulse sequences of the brain and surrounding structures were obtained without intravenous contrast. COMPARISON:  Prior CTs from earlier the same day. FINDINGS: Brain: Cerebral volume within normal limits. Scattered T2/FLAIR hyperintensity involving the supratentorial cerebral white matter, consistent with chronic small vessel ischemic disease. Few scatter remote lacunar infarcts present about the right corona radiata, basal ganglia, thalami, and pons. 6 mm focus of restricted diffusion at the left ventral pons/midbrain consistent with an acute ischemic infarct. No associated hemorrhage or mass effect. No other evidence for acute or subacute ischemia. Gray-white matter differentiation otherwise maintained. No acute intracranial hemorrhage. No mass lesion, midline shift or mass effect. No hydrocephalus or extra-axial fluid collection. Pituitary gland and suprasellar region within normal limits. Vascular: Major intracranial vascular flow voids are maintained. Skull and upper cervical spine: Craniocervical junction normal. Bone marrow signal intensity within normal limits. No scalp soft tissue abnormality. Sinuses/Orbits: Globes orbital soft tissues within normal limits. Scattered mucosal thickening noted about the ethmoidal air cells and maxillary sinuses. No mastoid effusion. Other: None. IMPRESSION: 1. 6 mm acute ischemic nonhemorrhagic left ventral pons/midbrain infarct. 2. Underlying chronic microvascular ischemic disease with multiple remote lacunar infarcts involving the right corona radiata, basal ganglia, thalami, and pons. Electronically Signed   By: Jeannine Boga M.D.   On: 09/11/2021 19:27   CT ANGIO HEAD NECK W WO CM W PERF (CODE STROKE)  Result Date: 09/11/2021 CLINICAL DATA:  Code stroke. Slurred speech. Last known well 3 a.m. EXAM: CT ANGIOGRAPHY HEAD AND NECK CT PERFUSION BRAIN TECHNIQUE: Multidetector CT imaging of the head and neck was performed using the standard protocol during bolus administration of intravenous contrast. Multiplanar CT image reconstructions and MIPs were obtained to evaluate the vascular anatomy. Carotid stenosis measurements (when applicable) are obtained utilizing NASCET criteria, using the distal internal carotid diameter as the denominator. Multiphase CT imaging of the brain was performed following IV bolus contrast injection. Subsequent parametric perfusion maps were calculated using RAPID software. RADIATION DOSE REDUCTION: This exam was performed according to the departmental dose-optimization program which includes automated exposure control, adjustment of the mA and/or kV according to patient size and/or use of iterative reconstruction technique. CONTRAST:  123mL OMNIPAQUE IOHEXOL 350 MG/ML SOLN COMPARISON:  CT head without contrast 09/11/2021.  MR head without contrast 08/26/2020. MRA head without contrast 08/27/2020 FINDINGS: CTA NECK FINDINGS Aortic arch: 3 vessel arch configuration is present. No significant atherosclerotic changes are present at the arch. No aneurysm or stenosis. Right carotid system: The right common carotid artery is within normal limits. Bifurcation is unremarkable. Cervical right ICA is normal. Left carotid system: The left common carotid artery is within normal limits. The bifurcation is unremarkable. Cervical left ICA is normal. Vertebral arteries: The left vertebral artery is dominant. Both vertebral arteries originate from the subclavian arteries without significant stenosis. No significant stenosis is present in either vertebral artery in the neck. Skeleton: Vertebral body heights and  alignment are normal. No focal osseous lesions present. The patient is edentulous. Other neck: Soft tissues the neck are otherwise unremarkable. Salivary glands are within normal limits. Thyroid is normal. No significant adenopathy is present. No focal mucosal or submucosal lesions are present. Upper chest: Lung apices are clear. Thoracic inlet is within normal limits. Review of the MIP images confirms the above findings CTA HEAD FINDINGS Anterior circulation: The carotid arteries are within normal limits the skull base through the ICA termini. The A1 and M1 segments are normal. The anterior communicating artery is patent. MCA bifurcations are within normal limits bilaterally. The ACA and MCA branch vessels are normal. Posterior circulation: The left vertebral artery is the dominant vessel. The right vertebral artery terminates at the PICA. Left PICA origin is visualized and normal. The left vertebral artery becomes the basilar artery. Basilar artery is normal. Both posterior cerebral arteries originate from basilar tip. The PCA branch vessels are within normal limits bilaterally. Venous sinuses: The dural sinuses are patent. The straight sinus deep cerebral veins are intact. Cortical veins are within normal limits. No significant vascular malformation is evident. Anatomic variants: None Review of the MIP images confirms the above findings CT Brain Perfusion Findings: The field of view is lower than typical protocol. Software could not calculate perfusion mass. IMPRESSION: 1. No emergent large vessel occlusion. 2. No significant stenosis in the neck or circle-of-Willis. 3. CT perfusion maps could not be calculated due to technical factors related to the field of view. The patient could be re-scanned for perfusion if clinically indicated. Electronically Signed   By: Marin Roberts M.D.   On: 09/11/2021 18:27   CT HEAD CODE STROKE WO CONTRAST  Result Date: 09/11/2021 CLINICAL DATA:  Code stroke. Slurred  speech. Last known well 3 a.m. EXAM: CT HEAD WITHOUT CONTRAST TECHNIQUE: Contiguous axial images were obtained from the base of the skull through the vertex without intravenous contrast. RADIATION DOSE REDUCTION: This exam was performed according to the departmental dose-optimization program which includes automated exposure control, adjustment of the mA and/or kV according to patient size and/or use of iterative reconstruction technique. COMPARISON:  CT head without contrast 11/16/2020. MR head without contrast 08/26/2020 FINDINGS: Brain: Remote internal capsule lacunar infarcts noted bilaterally. Remote left thalamic lacunar infarct noted. Mild atrophy and diffuse white matter disease is otherwise stable. Basal ganglia are otherwise intact. Insular ribbon is normal. Acute or focal cortical abnormality is present. The ventricles are of normal size. No significant extraaxial fluid collection is present. The brainstem and cerebellum are within normal limits. Vascular: Atherosclerotic calcifications are present within the cavernous internal carotid arteries bilaterally. No hyperdense vessel is present. Skull: Calvarium is intact. No focal lytic or blastic lesions are present. No significant extracranial soft tissue lesion is present. Sinuses/Orbits: The paranasal sinuses and mastoid air cells are clear. The globes and  orbits are within normal limits. ASPECTS [facility] Stroke Program Early CT Score) - Ganglionic level infarction (caudate, lentiform nuclei, internal capsule, insula, M1-M3 cortex): 7/7 - Supraganglionic infarction (M4-M6 cortex): 3/3 Total score (0-10 with 10 being normal): 10/10 IMPRESSION: 1. No acute intracranial abnormality or significant interval change. 2. Stable atrophy and diffuse white matter disease. 3. Remote lacunar infarcts involving the internal capsule bilaterally and left thalamus. 4. Aspects 10/10 * The above was relayed via text pager to Dr. Leonel Ramsay on 09/11/2021 at 17:30 .  Electronically Signed   By: San Morelle M.D.   On: 09/11/2021 17:31

## 2021-09-12 NOTE — Evaluation (Signed)
Occupational Therapy Evaluation Patient Details Name: Martin Mullen MRN: [id number] DOB: 1981/01/03 Today's Date: 09/12/2021   History of Present Illness Pt is a 41 y/o M presenting to ED on 7/29 with slurred speech, MRI revealing 58mm acute inschemic nonhemorrhagic L ventral pons/midbrain infarct. CTA head and neck with no large vessel occlusion. PMH significant for STEMI s/p RCA stent 2014, HTN, recurrent strokes, insulin-dependent DM2, hyperlipidemia, cocaine use.   Clinical Impression   Pt independent at baseline with ADLs and functional mobility, reports living with his mother's boyfriend "Jimmy". Pt currently needing min-mod A for bed mobility, max A for transfers, and mod-total A for ADLs, pt lethargic, flat affect, however A & O x4 and following commands with increased time. Pt with generalized weakness, R sided deficits in ROM, strength, and coordination. Pt presenting with impairments listed below, will follow acutely. Recommend AIR at d/c.     Recommendations for follow up therapy are one component of a multi-disciplinary discharge planning process, led by the attending physician.  Recommendations may be updated based on patient status, additional functional criteria and insurance authorization.   Follow Up Recommendations  Acute inpatient rehab (3hours/day)    Assistance Recommended at Discharge Frequent or constant Supervision/Assistance  Patient can return home with the following A lot of help with walking and/or transfers;A lot of help with bathing/dressing/bathroom;Assistance with cooking/housework;Direct supervision/assist for medications management;Direct supervision/assist for financial management;Assist for transportation;Help with stairs or ramp for entrance;Assistance with feeding    Functional Status Assessment  Patient has had a recent decline in their functional status and demonstrates the ability to make significant improvements in function in a reasonable and  predictable amount of time.  Equipment Recommendations  None recommended by OT;Other (comment) (defer)    Recommendations for Other Services Rehab consult;PT consult     Precautions / Restrictions Precautions Precautions: Fall Restrictions Weight Bearing Restrictions: No      Mobility Bed Mobility Overal bed mobility: Needs Assistance Bed Mobility: Sit to Supine, Supine to Sit     Supine to sit: Mod assist Sit to supine: Min assist   General bed mobility comments: use of bedpad to sit EOB    Transfers Overall transfer level: Needs assistance Equipment used: Rolling walker (2 wheels) Transfers: Sit to/from Stand Sit to Stand: Max assist           General transfer comment: cues to push from bed vs pull from RW      Balance Overall balance assessment: Needs assistance Sitting-balance support: Bilateral upper extremity supported, Feet supported Sitting balance-Leahy Scale: Fair     Standing balance support: During functional activity, Reliant on assistive device for balance Standing balance-Leahy Scale: Poor Standing balance comment: reliant on external support                           ADL either performed or assessed with clinical judgement   ADL                                               Vision Patient Visual Report: No change from baseline Vision Assessment?: Yes Eye Alignment: Within Functional Limits Ocular Range of Motion: Impaired-to be further tested in functional context Tracking/Visual Pursuits: Decreased smoothness of horizontal tracking;Decreased smoothness of eye movement to RIGHT superior field;Decreased smoothness of eye movement to RIGHT inferior field Saccades: Additional eye  shifts occurred during testing Additional Comments: pt with central gaze most of session, delayed tracking and eye movemnt     Perception     Praxis      Pertinent Vitals/Pain Pain Assessment Pain Assessment: No/denies pain      Hand Dominance Right   Extremity/Trunk Assessment Upper Extremity Assessment Upper Extremity Assessment: Generalized weakness;LUE deficits/detail;RUE deficits/detail RUE Deficits / Details: ~30* shoulder AROM, 3/5 ROM/strength in elbow/wrist/hand, noted decreased use of RUE compared to LUE RUE Coordination: decreased fine motor;decreased gross motor LUE Deficits / Details: ~30* shoulder AROM LUE Coordination: decreased gross motor   Lower Extremity Assessment Lower Extremity Assessment: Defer to PT evaluation   Cervical / Trunk Assessment Cervical / Trunk Assessment: Normal   Communication Communication Communication: Expressive difficulties   Cognition Arousal/Alertness: Lethargic Behavior During Therapy: Flat affect Overall Cognitive Status: Impaired/Different from baseline Area of Impairment: Attention, Following commands, Safety/judgement, Awareness, Problem solving                   Current Attention Level: Sustained   Following Commands: Follows one step commands with increased time Safety/Judgement: Decreased awareness of safety Awareness: Intellectual Problem Solving: Slow processing, Difficulty sequencing, Decreased initiation, Requires verbal cues General Comments: A & O x4, able to count backwards, displays short term memory deficits, requires increased cuing for task initiation, can hold pen in R hand, unable to perform clock draw task     General Comments  VSS on RA    Exercises     Shoulder Instructions      Home Living Family/patient expects to be discharged to:: Private residence Living Arrangements: Other (Comment) (mother's boyfriend "Personnel officer") Available Help at Discharge: Family Type of Home: Mobile home       Home Layout: One level     Bathroom Shower/Tub: Tub/shower unit         Home Equipment: Gilmer Mor - single point;None          Prior Functioning/Environment Prior Level of Function : Independent/Modified  Independent;Driving             Mobility Comments: no AD use ADLs Comments: ind with IADLs; reports he does not work        OT Problem List: Decreased strength;Decreased range of motion;Decreased activity tolerance;Impaired balance (sitting and/or standing);Decreased cognition;Decreased coordination;Impaired vision/perception;Decreased safety awareness;Impaired UE functional use      OT Treatment/Interventions: Self-care/ADL training;Therapeutic exercise;DME and/or AE instruction;Neuromuscular education;Therapeutic activities;Visual/perceptual remediation/compensation;Patient/family education;Balance training;Cognitive remediation/compensation    OT Goals(Current goals can be found in the care plan section) Acute Rehab OT Goals Patient Stated Goal: none stated OT Goal Formulation: With patient Time For Goal Achievement: 09/26/21 Potential to Achieve Goals: Good ADL Goals Pt Will Perform Upper Body Dressing: with min assist;sitting Pt Will Perform Lower Body Dressing: with min assist;sitting/lateral leans;sit to/from stand Pt Will Transfer to Toilet: with min assist;regular height toilet;ambulating Pt Will Perform Tub/Shower Transfer: Tub transfer;Shower transfer;rolling walker;ambulating Additional ADL Goal #1: pt will perform 3 step trailmaking task in prep for ADLs  OT Frequency: Min 2X/week    Co-evaluation              AM-PAC OT "6 Clicks" Daily Activity     Outcome Measure Help from another person eating meals?: A Lot Help from another person taking care of personal grooming?: A Lot Help from another person toileting, which includes using toliet, bedpan, or urinal?: Total Help from another person bathing (including washing, rinsing, drying)?: A Lot Help from another person to put on and taking  off regular upper body clothing?: A Lot Help from another person to put on and taking off regular lower body clothing?: A Lot 6 Click Score: 11   End of Session Equipment  Utilized During Treatment: Gait belt;Rolling walker (2 wheels) Nurse Communication: Mobility status  Activity Tolerance: Patient tolerated treatment well Patient left: in bed;with call bell/phone within reach;with bed alarm set  OT Visit Diagnosis: Unsteadiness on feet (R26.81);Other abnormalities of gait and mobility (R26.89);Muscle weakness (generalized) (M62.81);Other symptoms and signs involving cognitive function;Cognitive communication deficit (R41.841) Symptoms and signs involving cognitive functions: Cerebral infarction                Time: 1110-1140 OT Time Calculation (min): 30 min Charges:  OT General Charges $OT Visit: 1 Visit OT Evaluation $OT Eval Moderate Complexity: 1 Mod OT Treatments $Self Care/Home Management : 8-22 mins  Alfonzo Beers, OTD, OTR/L Acute Rehab [phone]  Mayer Masker 09/12/2021, 12:23 PM

## 2021-09-12 NOTE — Evaluation (Signed)
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   Chicken pox    Coronary artery disease    Inferior ST elevation myocardial infarction in July of 2014. Cardiac catheterization showed 95% distal RCA stenosis and 90% first diagonal stenosis with normal ejection fraction. He underwent PCI in bare-metal stent placement to the distal RCA.   Diabetes mellitus without complication (HCC)    Hypercholesterolemia    Stroke (HCC)    Tobacco use    Past Surgical History:  Past Surgical History:  Procedure Laterality Date   CORONARY ANGIOPLASTY WITH STENT PLACEMENT     DEBRIDEMENT AND CLOSURE WOUND N/A 09/03/2019   Procedure: Debridement and closure of scrotal wound;  Surgeon: Allena Napoleon, MD;  Location: [facility];  Service: Plastics;  Laterality: N/A;   DEBRIDEMENT AND CLOSURE WOUND N/A 07/25/2019   Procedure: DEBRIDEMENT AND CLOSURE WOUND;  Surgeon: Allena Napoleon, MD;  Location: [facility];  Service: Plastics;  Laterality: N/A;   INCISION AND DRAINAGE ABSCESS N/A 07/20/2019   Procedure:  INCISION AND DRAINAGE ABSCESS;  Surgeon: Crista Elliot, MD;  Location: [facility];  Service: Urology;  Laterality: N/A;   LEFT HEART CATHETERIZATION WITH CORONARY ANGIOGRAM N/A 08/28/2012   Procedure: LEFT HEART CATHETERIZATION WITH CORONARY ANGIOGRAM;  Surgeon: Pamella Pert, MD;  Location[facility] CATH LAB;  Service: Cardiovascular;  Laterality: N/A;   TEE WITHOUT CARDIOVERSION N/A 09/23/2020   Procedure: TRANSESOPHAGEAL ECHOCARDIOGRAM (TEE);  Surgeon: Lamar Blinks, MD;  Location: [facility];  Service: Cardiovascular;  Laterality: N/A;   WRIST SURGERY  1988   HPI:  Patient is a 41 y.o. male with PMH: recurrent strokes, HTN, STEMI s/p RCA stent 2014, DM-2 insulin dependent, HLD, cocaine use. He presented to the hospital on 09/11/21 with slurred speech. MRI brain revealed 46mm acute ischemic nonhemmorhagic left ventral pons/midbrain infarct; CTA head and neck with no large vessel occlusion.   Assessment / Plan / Recommendation Clinical Impression  Patient presents with a mild-moderate cognitive deficit and a moderate flaccid dysarthria. He was able to follow 1-2 step commands with extra processing time and time to initiate. He was oriented x4. He responded to questions but did not spontanously initiate to comment or make requests. When feeding self applesauce, he initially did not attempt to use right hand but after SLP guided hand to spoon, he then started to slowly improve his use of it. He denied having any prior speech deficit from previous strokes that he has had but no family member present to confirm. His speech intelligibility was significantly impaired and in addition to vocal intensity not consistently maintained at adequate  level, at times he was less than 30% intelligible at phrase level and at best was less than 70% intelligible. Lingual ROM and strength was reduced and left side facial musculature was flaccid. SLP is recommending continued SLP intevervention to improve speech  intelligibility and cognitive function at basic level.    SLP Assessment  SLP Recommendation/Assessment: Patient needs continued Speech Lanaguage Pathology Services SLP Visit Diagnosis: Cognitive communication deficit (R41.841);Dysarthria and anarthria (R47.1)    Recommendations for follow up therapy are one component of a multi-disciplinary discharge planning process, led by the attending physician.  Recommendations may be updated based on patient status, additional functional criteria and insurance authorization.    Follow Up Recommendations  Acute inpatient rehab (3hours/day)    Assistance Recommended at Discharge  Frequent or constant Supervision/Assistance  Functional Status Assessment Patient has had a recent decline in their functional status and demonstrates the ability to make significant improvements in function in a reasonable and predictable amount of time.  Frequency and Duration min 2x/week  1 week      SLP Evaluation Cognition  Overall Cognitive Status: Impaired/Different from baseline Arousal/Alertness: Awake/alert Orientation Level: Oriented X4 Year: 2023 Month: July Day of Week: Correct Attention: Sustained Sustained Attention: Impaired Sustained Attention Impairment: Verbal basic;Functional basic Awareness: Impaired Awareness Impairment: Intellectual impairment;Emergent impairment Problem Solving: Impaired Problem Solving Impairment: Functional basic;Verbal basic Executive Function: Initiating Initiating: Impaired Initiating Impairment: Verbal basic;Functional basic Behaviors: Impulsive Safety/Judgment: Impaired       Comprehension  Auditory Comprehension Overall Auditory Comprehension: Impaired Commands:  (follows 1-2 step commands with extra time to initiate) Conversation: Simple Interfering Components: Attention;Processing speed;Motor planning EffectiveTechniques: Extra processing time Visual Recognition/Discrimination Discrimination: Not  tested Reading Comprehension Reading Status: Not tested    Expression Expression Primary Mode of Expression: Verbal Verbal Expression Overall Verbal Expression: Impaired Initiation: Impaired Level of Generative/Spontaneous Verbalization: Phrase Repetition: No impairment Naming: No impairment Pragmatics: Impairment Impairments: Abnormal affect;Eye contact;Monotone Interfering Components: Attention;Premorbid deficit Effective Techniques: Open ended questions;Semantic cues Non-Verbal Means of Communication: Not applicable Written Expression Dominant Hand: Right Written Expression: Not tested   Oral / Motor  Oral Motor/Sensory Function Overall Oral Motor/Sensory Function: Moderate impairment Facial ROM: Reduced right;Suspected CN VII (facial) dysfunction Facial Symmetry: Abnormal symmetry right;Suspected CN VII (facial) dysfunction Facial Strength: Reduced right;Suspected CN VII (facial) dysfunction Lingual Strength: Reduced Mandible: Impaired Motor Speech Overall Motor Speech: Impaired Respiration: Within functional limits Phonation: Low vocal intensity Resonance: Within functional limits Articulation: Impaired Level of Impairment: Phrase Intelligibility: Intelligibility reduced Word: 50-74% accurate Phrase: 25-49% accurate Sentence: Not tested Conversation: Not tested Motor Planning: Witnin functional limits Motor Speech Errors: Not applicable Effective Techniques: Slow rate;Increased vocal intensity           Angela Nevin, MA, CCC-SLP Speech Therapy

## 2021-09-12 NOTE — Progress Notes (Signed)
Modified Barium Swallow Progress Note  Patient Details  Name: Martin Mullen MRN: [id number] Date of Birth: [birth year]  Today's Date: 09/12/2021  Modified Barium Swallow completed.  Full report located under Chart Review in the Imaging Section.  Brief recommendations include the following:  Clinical Impression  Patient presents with mild-mod oral phase and mild pharyngeal phase dysphagia as per this MBS.  During oral phase, patient exhibited brief holding of liquid boluses as he was observed to impulsively try to pour all of barium from cup into mouth before initiating a swallow. He also exhibited delays in anterior to posterior transit of puree solids and mechanical soft solids. With thin liquids, patient exhibited swallow intiation delays mainly at level of pyriform sinus but at times swallow was initiated at level of vallecular sinus. He exhibited fairly consistent flash penetration of thin liquids (PAS 2)  but with full clearance from laryngeal vestibule each time and no significant pharyngeal residuals post initial swallow. With nectar thick liquids and puree solids he exhibited swallow initiation delay at level of vallecular sinus but only one instance of flash penetration with nectar thick liquids. He did exhibit one instance of sensed, trace aspiration which occured during the swallow when patient took sip of thin liquid barium while still having some masticated mechanical soft solid in his mouth. His cough was strong and appeared to result in at least some of aspirate being ejected out  after passing through vocal cords. When performing esophageal sweep with barium tablet, the tablet as well as the puree solids that were taken along with it were observed in approximately upper thoracic portion of esophagus and an additional bite of puree helped to fully transit tablet and puree throuth esophagus. SLP is recommending initiate Dys 1 (puree) solids and nectar thick liquids via cup sips (no straws)  at this time. SLP will follow patient for diet toleration.   Swallow Evaluation Recommendations       SLP Diet Recommendations: Dysphagia 1 (Puree) solids;Nectar thick liquid   Liquid Administration via: No straw;Cup   Medication Administration: Whole meds with puree   Supervision: Full assist for feeding;Staff to assist with self feeding   Compensations: Slow rate;Small sips/bites   Postural Changes: Seated upright at 90 degrees   Oral Care Recommendations: Oral care BID;Staff/trained caregiver to provide oral care   Other Recommendations: Order thickener from pharmacy;Prohibited food (jello, ice cream, thin soups);Have oral suction available;Clarify dietary restrictions   Angela Nevin, MA, CCC-SLP Speech Therapy

## 2021-09-13 LAB — GLUCOSE, CAPILLARY
Glucose-Capillary: 265 mg/dL — ABNORMAL HIGH (ref 70–99)
Glucose-Capillary: 277 mg/dL — ABNORMAL HIGH (ref 70–99)
Glucose-Capillary: 160 mg/dL — ABNORMAL HIGH (ref 70–99)
Glucose-Capillary: 123 mg/dL — ABNORMAL HIGH (ref 70–99)
Glucose-Capillary: 151 mg/dL — ABNORMAL HIGH (ref 70–99)
Glucose-Capillary: 153 mg/dL — ABNORMAL HIGH (ref 70–99)

## 2021-09-13 MED ORDER — NICOTINE 21 MG/24HR TD PT24
21.0000 mg | MEDICATED_PATCH | Freq: Every day | TRANSDERMAL | Status: DC
  Administered 2021-09-13 – 2021-09-28 (×16): 21 mg via TRANSDERMAL
  Filled 2021-09-13 (×16): qty 1

## 2021-09-13 MED ORDER — INSULIN GLARGINE-YFGN 100 UNIT/ML ~~LOC~~ SOLN
12.0000 [IU] | Freq: Every day | SUBCUTANEOUS | Status: DC
  Administered 2021-09-13 – 2021-09-28 (×16): 12 [IU] via SUBCUTANEOUS
  Filled 2021-09-13 (×17): qty 0.12

## 2021-09-13 NOTE — Progress Notes (Signed)
Inpatient Rehab Admissions Coordinator:  ? ?Per therapy recommendations,  patient was screened for CIR candidacy by Hau Sanor, MS, CCC-SLP. At this time, Pt. Appears to be a a potential candidate for CIR. I will place   order for rehab consult per protocol for full assessment. Please contact me any with questions. ? ?Brexton Sofia, MS, CCC-SLP ?Rehab Admissions Coordinator  ?[phone] (celll) ?[phone] (office) ? ?

## 2021-09-13 NOTE — TOC Initial Note (Signed)
Transition of Care [facility]) - Initial/Assessment Note    Patient Details  Name: Martin Mullen MRN: [id number] Date of Birth: [birth year]  [facility] CM/SW Contact:    Martin Masson, RN Phone Number: 09/13/2021, 3:45 PM  Clinical Narrative:                 Spoke to patient regarding transition needs. Patient doesn't have insurance and is agreeable for RNCM to send Martin Mullen emailed. This RNCM emailed FC for potential medicaid. CIR following. Patient states he lives with a friend, Martin Mullen.  TOC will continue to follow for needs.  Expected Discharge Plan: IP Rehab Facility Barriers to Discharge: Continued Medical Work up   Patient Goals and CMS Choice        Expected Discharge Plan and Services Expected Discharge Plan: IP Rehab Facility                                              Prior Living Arrangements/Services   Lives with:: Friends                   Activities of Daily Living Home Assistive Devices/Equipment: Other (Comment) (cane at home) ADL Screening (condition at time of admission) Patient's cognitive ability adequate to safely complete daily activities?: No Is the patient deaf or have difficulty hearing?: No Does the patient have difficulty seeing, even when wearing glasses/contacts?: Yes Does the patient have difficulty concentrating, remembering, or making decisions?: Yes Patient able to express need for assistance with ADLs?: Yes Does the patient have difficulty dressing or bathing?: Yes Independently performs ADLs?: No Communication: Independent with device (comment) (pt reports use of cane at home) Dressing (OT): Independent with device (comment) Grooming: Independent with device (comment) Feeding: Independent with device (comment) Bathing: Independent with device (comment) Toileting: Independent with device (comment) In/Out Bed: Independent with device (comment) Walks in Home: Independent with device (comment) Does the patient  have difficulty walking or climbing stairs?: Yes Weakness of Legs: Both Weakness of Arms/Hands: Both  Permission Sought/Granted                  Emotional Assessment         Alcohol / Substance Use: Not Applicable Psych Involvement: No (comment)  Admission diagnosis:  Stroke [facility]) [I63.9] Dysarthria [R47.1] Cerebral infarction, unspecified mechanism (HCC) [I63.9] Patient Active Problem List   Diagnosis Date Noted   Stroke (HCC) 09/11/2021   Overweight with body mass index (BMI) of 27 to 27.9 in adult 10/08/2020   Recurrent strokes (HCC) 08/27/2020   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) 03/25/2020   GERD (gastroesophageal reflux disease) 07/20/2019   Coronary artery disease    Type 2 diabetes mellitus with hyperlipidemia (HCC) 07/09/2013   STEMI s/p RCA stent 08/2012 09/05/2012   HTN (hypertension) 09/05/2012   HLD (hyperlipidemia) 09/05/2012   Adjustment disorder with mixed anxiety and depressed mood 09/05/2012   PCP:  Martin Munroe, NP Pharmacy:   [id number] Martin Mullen, Martin Mullen - [address] AT [facility] OF [address] [place] [address] Phone: [phone] Fax: [phone]     Social Determinants of Health (SDOH) Interventions    Readmission Risk Interventions     No data to display

## 2021-09-13 NOTE — Evaluation (Signed)
Physical Therapy Evaluation Patient Details Name: Martin Mullen MRN: [id number] DOB: 11-May-1980 Today's Date: 09/13/2021  History of Present Illness  Pt is a 41 y/o M presenting to ED on 7/29 with slurred speech, MRI revealing 13mm acute inschemic nonhemorrhagic L ventral pons/midbrain infarct. CTA head and neck with no large vessel occlusion. PMH significant for STEMI s/p RCA stent 2014, HTN, recurrent strokes, insulin-dependent DM2, hyperlipidemia, cocaine use.  Clinical Impression  Pt admitted with/for stroke and needing min to mod assist for basic mobility and gait.  Pt currently limited functionally due to the problems listed below.  (see problems list.)  Pt will benefit from PT to maximize function and safety to be able to get home safely with available assist .        Recommendations for follow up therapy are one component of a multi-disciplinary discharge planning process, led by the attending physician.  Recommendations may be updated based on patient status, additional functional criteria and insurance authorization.  Follow Up Recommendations Acute inpatient rehab (3hours/day)      Assistance Recommended at Discharge Frequent or constant Supervision/Assistance  Patient can return home with the following  A little help with walking and/or transfers;A little help with bathing/dressing/bathroom;Assistance with cooking/housework;Assist for transportation;Help with stairs or ramp for entrance    Equipment Recommendations    Recommendations for Other Services  Rehab consult    Functional Status Assessment Patient has had a recent decline in their functional status and demonstrates the ability to make significant improvements in function in a reasonable and predictable amount of time.     Precautions / Restrictions Precautions Precautions: Fall Restrictions Weight Bearing Restrictions: No      Mobility  Bed Mobility Overal bed mobility: Needs Assistance Bed Mobility: Sit  to Supine, Supine to Sit     Supine to sit: Mod assist Sit to supine: Min assist   General bed mobility comments: use of bedpad to sit EOB    Transfers Overall transfer level: Needs assistance Equipment used: Rolling walker (2 wheels) Transfers: Sit to/from Stand Sit to Stand: Mod assist, Min assist (dependent on surface height)           General transfer comment: cues for hand placement, stability assist elevation, some boost from lower surface    Ambulation/Gait Ambulation/Gait assistance: Mod assist Gait Distance (Feet): 12 Feet Assistive device: 1 person hand held assist Gait Pattern/deviations: Step-through pattern, Decreased step length - right, Decreased step length - left, Decreased stride length   Gait velocity interpretation: <1.8 ft/sec, indicate of risk for recurrent falls   General Gait Details: generally unsteady due to R LE incoordination with gait pattern/swing through, L HHA and R holding to the foot board.  Stairs            Wheelchair Mobility    Modified Rankin (Stroke Patients Only)       Balance Overall balance assessment: Needs assistance Sitting-balance support: Bilateral upper extremity supported, Feet supported Sitting balance-Leahy Scale: Fair (but falls backward with challenge) Sitting balance - Comments: unable to accept challenge posteriorly   Standing balance support: During functional activity, Reliant on assistive device for balance Standing balance-Leahy Scale: Poor Standing balance comment: reliant on external support                             Pertinent Vitals/Pain      Home Living Family/patient expects to be discharged to:: Private residence Living Arrangements: Other (Comment) (mother's boyfriend "[place]")  Available Help at Discharge: Family Type of Home: Mobile home         Home Layout: One level Home Equipment: Cane - single point;None      Prior Function Prior Level of Function :  Independent/Modified Independent;Driving             Mobility Comments: no AD use ADLs Comments: ind with IADLs; reports he does not work     Higher education careers adviser Dominance   Dominant Hand: Right    Extremity/Trunk Assessment        Lower Extremity Assessment Lower Extremity Assessment: RLE deficits/detail RLE Deficits / Details: mildly uncoordinated, strength 4- to 4/5 grossly RLE Sensation: WNL RLE Coordination: decreased fine motor    Cervical / Trunk Assessment Cervical / Trunk Assessment: Normal  Communication   Communication: Expressive difficulties  Cognition Arousal/Alertness: Lethargic Behavior During Therapy: Flat affect Overall Cognitive Status: Impaired/Different from baseline Area of Impairment: Attention, Following commands, Safety/judgement, Awareness, Problem solving                   Current Attention Level: Sustained   Following Commands: Follows one step commands with increased time Safety/Judgement: Decreased awareness of safety Awareness: Intellectual Problem Solving: Slow processing, Difficulty sequencing, Decreased initiation, Requires verbal cues General Comments: A & O x4, able to count backwards, displays short term memory deficits, requires increased cuing for task initiation, can hold pen in R hand, unable to perform clock draw task        General Comments General comments (skin integrity, edema, etc.): vss on RA    Exercises     Assessment/Plan    PT Assessment Patient needs continued PT services  PT Problem List Decreased strength;Decreased activity tolerance;Decreased balance;Decreased mobility;Decreased coordination       PT Treatment Interventions DME instruction;Gait training;Functional mobility training;Therapeutic activities;Balance training;Neuromuscular re-education;Patient/family education    PT Goals (Current goals can be found in the Care Plan section)  Acute Rehab PT Goals Patient Stated Goal: not stated PT Goal  Formulation: With patient Time For Goal Achievement: 09/27/21 Potential to Achieve Goals: Good    Frequency Min 4X/week     Co-evaluation               AM-PAC PT "6 Clicks" Mobility  Outcome Measure Help needed turning from your back to your side while in a flat bed without using bedrails?: A Lot Help needed moving from lying on your back to sitting on the side of a flat bed without using bedrails?: A Lot Help needed moving to and from a bed to a chair (including a wheelchair)?: A Lot Help needed standing up from a chair using your arms (e.g., wheelchair or bedside chair)?: A Lot Help needed to walk in hospital room?: A Lot Help needed climbing 3-5 steps with a railing? : A Lot 6 Click Score: 12    End of Session   Activity Tolerance: Patient tolerated treatment well Patient left: in chair;with call bell/phone within reach;with chair alarm set Nurse Communication: Mobility status PT Visit Diagnosis: Other abnormalities of gait and mobility (R26.89);Other symptoms and signs involving the nervous system (R29.898)    Time: 7564-3329 PT Time Calculation (min) (ACUTE ONLY): 35 min   Charges:   PT Evaluation $PT Eval Moderate Complexity: 1 Mod PT Treatments $Gait Training: 8-22 mins        09/13/2021  Jacinto Halim., PT Acute Rehabilitation Services [phone]  (pager) [phone]  (office)  Martin Mullen 09/13/2021, 5:31 PM

## 2021-09-13 NOTE — Progress Notes (Signed)
Speech Language Pathology Treatment: Dysphagia;Cognitive-Linquistic  Patient Details Name: Martin Mullen MRN: [id number] DOB: 1980/06/22 Today's Date: 09/13/2021 Time: 6314-9702 SLP Time Calculation (min) (ACUTE ONLY): 15 min  Assessment / Plan / Recommendation Clinical Impression  Pt was seen after MBS on previous date. He recalls general details from [facility] when asked, and denies any overt coughing during meals since. SLP provided advanced trials of thin liquids first, cueing the pt to take small, single sips to try to maximize safety. Hand-over-hand assist was provided initially but then faded, and despite additional verbal cues, pt immediately resumed fast, continuous drinking that resulted in coughing episode that pt was trying to stifle. Encouraged him to cough but he did not perform volitional cough. Pt consumed additional boluses of purees and nectar thick liquids with slow consumption but no overt s/s of aspiration. Pt demonstrated attempts at functional problem solving when having trouble with self-feeding. Recommend he remain on Dys 1 diet and nectar thick liquids with ongoing SLP f/u for speech, cognition, and swallowing.    HPI HPI: Patient is a 41 y.o. male with PMH: recurrent strokes, HTN, STEMI s/p RCA stent 2014, DM-2 insulin dependent, HLD, cocaine use. He presented to the hospital on 09/11/21 with slurred speech. MRI brain revealed 72mm acute ischemic nonhemmorhagic left ventral pons/midbrain infarct; CTA head and neck with no large vessel occlusion.      SLP Plan  Continue with current plan of care      Recommendations for follow up therapy are one component of a multi-disciplinary discharge planning process, led by the attending physician.  Recommendations may be updated based on patient status, additional functional criteria and insurance authorization.    Recommendations  Diet recommendations: Dysphagia 1 (puree);Nectar-thick liquid Liquids provided via: Cup;No  straw Medication Administration: Crushed with puree Supervision: Staff to assist with self feeding Compensations: Slow rate;Small sips/bites Postural Changes and/or Swallow Maneuvers: Seated upright 90 degrees;Upright 30-60 min after meal                Oral Care Recommendations: Oral care BID;Staff/trained caregiver to provide oral care Follow Up Recommendations: Acute inpatient rehab (3hours/day) Assistance recommended at discharge: Frequent or constant Supervision/Assistance SLP Visit Diagnosis: Dysphagia, oropharyngeal phase (R13.12) Plan: Continue with current plan of care           Mahala Menghini., M.A. CCC-SLP Acute Rehabilitation Services Office [phone]  Secure chat preferred  09/13/2021, 3:42 PM

## 2021-09-13 NOTE — Progress Notes (Signed)
PROGRESS NOTE                                                                                                                                                                                                             Patient Demographics:    Martin Mullen, is a 41 y.o. male, DOB - 10-10-1980, ZOX:[id number]  Outpatient Primary MD for the patient is [name], NP    LOS - 1  Admit date - 09/11/2021    Chief Complaint  Patient presents with   Code Stroke       Brief Narrative (HPI from H&P) -  41 y.o. 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    MRI brain was revealing for 6 mm acute ischemic nonhemorrhagic left ventral pons/midbrain infarct.  CTA head and neck with no large vessel occlusion.   Subjective:    Martin Mullen today has, No headache, No chest pain, No abdominal pain - No Nausea, No new weakness tingling or numbness, no SOB   Assessment  & Plan :    Stroke   - 6 mm acute ischemic non-hemorrhagic left ventral pons/midbrain Infarct.  He has history of previous stroke, he also has history of prothrombin gene mutation, history of noncompliance with medications.  He does seem to have an acute stroke now, confirms that he was not taking any medications for 3 weeks prior to this hospital admission, counseled on compliance, currently on combination of aspirin and Eliquis, Crestor and insulin regimen also resumed.  A1c is greater than 14 LDL above goal. Echo stable full stroke work-up done.  Await placement to SNF.  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  They advise keeping on Eliquis indefinitely.  HTN (hypertension)  Allowing  for permissive hypertension  STEMI s/p RCA stent 08/2012 - Asymptomatic from a cardiac standpoint.  Continue Eliquis and aspirin combination along with statin for secondary prevention, ARB held to allow for permissive hypertension..  Dehydration.  Hydrated with IV fluids.    Type 2 diabetes mellitus with hyperlipidemia - poor control in the outpatient setting as evident by the extremely high A1c, he was not  taking any medications for 3 weeks, counseled on compliance, placed on long-acting insulin and sliding scale.  Monitor and adjust  CBG (last 3)  Recent Labs    09/12/21 1635 09/12/21 2143 09/13/21 0747  GLUCAP 163* 183* 160*   Lab Results  Component Value Date   HGBA1C >14 08/05/2021         Condition - Extremely Guarded  Family Communication  :  friend Wyatt Mage - [phone]  - on 09/12/21  Code Status :  Full  Consults  :  Neuro  PUD Prophylaxis : Eliquis   Procedures  :     MRI - 1. 6 mm acute ischemic nonhemorrhagic left ventral pons/midbrain infarct. 2. Underlying chronic microvascular ischemic disease with multiple remote lacunar infarcts involving the right corona radiata, basal ganglia, thalami, and pons.  CTA - 1. No acute intracranial abnormality or significant interval change. 2. Stable atrophy and diffuse white matter disease. 3. Remote lacunar infarcts involving the internal capsule bilaterally and left thalamus. 4. Aspects 10/10   TTE - 1. Left ventricular ejection fraction, by estimation, is 60 to 65%. The left ventricle has normal function. The left ventricle has no regional wall motion abnormalities. Left ventricular diastolic parameters were normal.  2. Right ventricular systolic function is normal. The right ventricular size is normal.  3. The mitral valve is normal in structure. No evidence of mitral valve regurgitation.  4. The aortic valve was not well visualized. Aortic valve regurgitation is not visualized.  5. IVC not well visualized.        Disposition Plan  :    Status is: Observation  DVT Prophylaxis  :     apixaban (ELIQUIS) tablet 5 mg   Lab Results  Component Value Date   PLT 213 09/11/2021    Diet :  Diet Order             DIET - DYS 1 Room service appropriate? No; Fluid consistency: Nectar Thick  Diet effective now                    Inpatient Medications  Scheduled Meds:   stroke: early stages of recovery book   Does not apply Once   apixaban  5 mg Oral BID   aspirin EC  81 mg Oral Daily   insulin aspart  0-20 Units Subcutaneous TID PC & HS   rosuvastatin  20 mg Oral Daily   sodium chloride flush  3 mL Intravenous Once   Continuous Infusions:  lactated ringers 75 mL/hr at 09/12/21 1314   PRN Meds:.acetaminophen **OR** acetaminophen (TYLENOL) oral liquid 160 mg/5 mL **OR** acetaminophen, food thickener  Antibiotics  :    Anti-infectives (From admission, onward)    None        Time Spent in minutes  30   Susa Raring M.D on 09/13/2021 at 8:41 AM  To page go to [email or web address]   [facility] -  Office  [phone]  See all Orders from today for further details    Objective:   Vitals:   09/12/21 2000 09/13/21 0100 09/13/21 0400 09/13/21 0744  BP: 112/78 135/86 (!) 122/99 (!) 143/89  Pulse: 83 95 84 84  Resp: Temp: 98.3 F (36.8 C) 97.7 F (36.5 C) 98.1 F (36.7 C) 98 F (36.7 C)  TempSrc: Oral Oral Oral Oral  SpO2: 98% 98% 95% 97%  Weight:      Height:        Wt Readings from  Last 3 Encounters:  09/11/21 87.8 kg  08/05/21 88.5 kg  12/23/20 107 kg    No intake or output data in the 24 hours ending 09/13/21 0841    Physical Exam  Awake alert, continues to have right-sided weakness acute on chronic, mild dysarthria, overall improved from before [place].AT,PERRAL Supple Neck, No JVD,   Symmetrical Chest wall movement, Good air movement bilaterally, CTAB RRR,No Gallops, Rubs or new Murmurs,  +ve B.Sounds, Abd Soft, No tenderness,   No  Cyanosis, Clubbing or edema    Data Review:    CBC Recent Labs  Lab 09/11/21 1719 09/11/21 1729  WBC 8.1  --   HGB 14.9 14.3  HCT 42.6 42.0  PLT 213  --   MCV 90.4  --   MCH 31.6  --   MCHC 35.0  --   RDW 12.5  --   LYMPHSABS 3.0  --   MONOABS 0.5  --   EOSABS 0.7*  --   BASOSABS 0.1  --     Electrolytes Recent Labs  Lab 09/11/21 1719 09/11/21 1729  NA 137 138  K 3.6 3.6  CL 106 103  CO2 23  --   GLUCOSE 266* 266*  BUN 5* 4*  CREATININE 0.76 0.60*  CALCIUM 8.5*  --   AST 9*  --   ALT 6  --   ALKPHOS 101  --   BILITOT 0.3  --   ALBUMIN 2.8*  --   INR 1.0  --     ------------------------------------------------------------------------------------------------------------------ Recent Labs    09/12/21 0419  CHOL 124  HDL 27*  LDLCALC 75  TRIG 865  CHOLHDL 4.6    Lab Results  Component Value Date   HGBA1C >14 08/05/2021      Radiology Reports DG Swallowing Func-Speech Pathology  Result Date: 09/12/2021 Table formatting from the original result was not included. Objective Swallowing Evaluation: Type of Study: MBS-Modified Barium Swallow Study  Patient Details Name: JUNIOR HUEZO MRN: [id number] Date of Birth: [birth year] Today's Date: 09/12/2021 Time: SLP Start Time (ACUTE ONLY): 1445 -SLP Stop Time (ACUTE ONLY): 1505 SLP Time Calculation (min) (ACUTE ONLY): 20 min Past Medical History: Past Medical History: Diagnosis Date  Acute transmural inferior wall MI (HCC) 08/28/2012  .5 x 12 mm Veri-flex stent non-DES.  Chicken pox   Coronary artery disease   Inferior ST elevation myocardial infarction in July of 2014. Cardiac catheterization showed 95% distal RCA stenosis and 90% first diagonal stenosis with normal ejection fraction. He underwent PCI in bare-metal stent placement to the distal RCA.  Diabetes mellitus without complication (HCC)   Hypercholesterolemia   Stroke (HCC)   Tobacco use  Past Surgical History: Past Surgical History: Procedure Laterality Date   CORONARY ANGIOPLASTY WITH STENT PLACEMENT    DEBRIDEMENT AND CLOSURE WOUND N/A 09/03/2019  Procedure: Debridement and closure of scrotal wound;  Surgeon: Allena Napoleon, MD;  Location: [facility];  Service: Plastics;  Laterality: N/A;  DEBRIDEMENT AND CLOSURE WOUND N/A 07/25/2019  Procedure: DEBRIDEMENT AND CLOSURE WOUND;  Surgeon: Allena Napoleon, MD;  Location: [facility];  Service: Plastics;  Laterality: N/A;  INCISION AND DRAINAGE ABSCESS N/A 07/20/2019  Procedure: INCISION AND DRAINAGE ABSCESS;  Surgeon: Crista Elliot, MD;  Location: [facility];  Service: Urology;  Laterality: N/A;  LEFT HEART CATHETERIZATION WITH CORONARY ANGIOGRAM N/A 08/28/2012  Procedure: LEFT HEART CATHETERIZATION WITH CORONARY ANGIOGRAM;  Surgeon: Pamella Pert, MD;  Location: [facility] CATH LAB;  Service: Cardiovascular;  Laterality:  N/A;  TEE WITHOUT CARDIOVERSION N/A 09/23/2020  Procedure: TRANSESOPHAGEAL ECHOCARDIOGRAM (TEE);  Surgeon: Lamar Blinks, MD;  Location: [facility];  Service: Cardiovascular;  Laterality: N/A;  WRIST SURGERY  1988 HPI: Patient is a 41 y.o. male with PMH: recurrent strokes, HTN, STEMI s/p RCA stent 2014, DM-2 insulin dependent, HLD, cocaine use. He presented to the hospital on 09/11/21 with slurred speech. MRI brain revealed 26mm acute ischemic nonhemmorhagic left ventral pons/midbrain infarct; CTA head and neck with no large vessel occlusion.  Subjective: awake, alert, cooperative  Recommendations for follow up therapy are one component of a multi-disciplinary discharge planning process, led by the attending physician.  Recommendations may be updated based on patient status, additional functional criteria and insurance authorization. Assessment / Plan / Recommendation   09/12/2021   4:00 PM Clinical Impressions Clinical Impression Patient presents with mild-mod oral phase and mild pharyngeal phase dysphagia as per this MBS.  During oral phase, patient exhibited brief holding of liquid boluses as he  was observed to impulsively try to pour all of barium from cup into mouth before initiating a swallow. He also exhibited delays in anterior to posterior transit of puree solids and mechanical soft solids. With thin liquids, patient exhibited swallow intiation delays mainly at level of pyriform sinus but at times swallow was initiated at level of vallecular sinus. He exhibited fairly consistent flash penetration of thin liquids (PAS 2)  but with full clearance from laryngeal vestibule each time and no significant pharyngeal residuals post initial swallow. With nectar thick liquids and puree solids he exhibited swallow initiation delay at level of vallecular sinus but only one instance of flash penetration with nectar thick liquids. He did exhibit one instance of sensed, trace aspiration which occured during the swallow when patient took sip of thin liquid barium while still having some masticated mechanical soft solid in his mouth. His cough was strong and appeared to result in at least some of aspirate being ejected out  after passing through vocal cords. When performing esophageal sweep with barium tablet, the tablet as well as the puree solids that were taken along with it were observed in approximately upper thoracic portion of esophagus and an additional bite of puree helped to fully transit tablet and puree throuth esophagus. SLP is recommending initiate Dys 1 (puree) solids and nectar thick liquids via cup sips (no straws) at this time. SLP will follow patient for diet toleration. SLP Visit Diagnosis Dysphagia, oropharyngeal phase (R13.12) Impact on safety and function Mild aspiration risk     09/12/2021   4:00 PM Treatment Recommendations Treatment Recommendations Therapy as outlined in treatment plan below     09/12/2021   4:00 PM Prognosis Prognosis for Safe Diet Advancement Good   09/12/2021   4:00 PM Diet Recommendations SLP Diet Recommendations Dysphagia 1 (Puree) solids;Nectar thick liquid Liquid  Administration via No straw;Cup Medication Administration Whole meds with puree Compensations Slow rate;Small sips/bites Postural Changes Seated upright at 90 degrees     09/12/2021   4:00 PM Other Recommendations Oral Care Recommendations Oral care BID;Staff/trained caregiver to provide oral care Other Recommendations Order thickener from pharmacy;Prohibited food (jello, ice cream, thin soups);Have oral suction available;Clarify dietary restrictions Follow Up Recommendations Acute inpatient rehab (3hours/day) Assistance recommended at discharge Frequent or constant Supervision/Assistance Functional Status Assessment Patient has had a recent decline in their functional status and demonstrates the ability to make significant improvements in function in a reasonable and predictable amount of time.   09/12/2021   4:00 PM Frequency  and Duration  Speech Therapy Frequency (ACUTE ONLY) min 2x/week Treatment Duration 2 weeks     09/12/2021   4:00 PM Oral Phase Oral Phase Impaired Oral - Nectar Cup Reduced posterior propulsion Oral - Thin Cup Reduced posterior propulsion;Holding of bolus Oral - Thin Straw Reduced posterior propulsion Oral - Puree Weak lingual manipulation;Reduced posterior propulsion Oral - Mech Soft Weak lingual manipulation;Impaired mastication;Reduced posterior propulsion Oral - Pill Reduced posterior propulsion    09/12/2021   4:00 PM Pharyngeal Phase Pharyngeal Phase Impaired Pharyngeal- Nectar Cup Delayed swallow initiation-vallecula;Delayed swallow initiation-pyriform sinuses;Reduced airway/laryngeal closure;Penetration/Aspiration during swallow;Pharyngeal residue - valleculae Pharyngeal Material enters airway, remains ABOVE vocal cords then ejected out Pharyngeal- Thin Cup Delayed swallow initiation-pyriform sinuses;Delayed swallow initiation-vallecula;Reduced airway/laryngeal closure;Penetration/Aspiration during swallow;Trace aspiration Pharyngeal Material enters airway, passes BELOW cords and not  ejected out despite cough attempt by patient;Material enters airway, passes BELOW cords then ejected out Pharyngeal- Thin Straw Delayed swallow initiation-pyriform sinuses;Delayed swallow initiation-vallecula;Reduced airway/laryngeal closure;Penetration/Aspiration during swallow Pharyngeal Material enters airway, remains ABOVE vocal cords then ejected out Pharyngeal- Puree Delayed swallow initiation-vallecula Pharyngeal- Mechanical Soft Delayed swallow initiation-vallecula Pharyngeal- Pill [facility]    09/12/2021   4:00 PM Cervical Esophageal Phase  Cervical Esophageal Phase [facility] Angela Nevin, MA, CCC-SLP Speech Therapy                     ECHOCARDIOGRAM COMPLETE  Result Date: 09/12/2021    ECHOCARDIOGRAM REPORT   Patient Name:   DAKARAI JEPPSEN Date of Exam: 09/12/2021 Medical Rec #:  [id number]       Height:       70.0 in Accession #:    [id number]      Weight:       193.6 lb Date of Birth:  [birth year]       BSA:          2.059 m Patient Age:    41 years        BP:           109/84 mmHg Patient Gender: M               HR:           72 bpm. Exam Location:  Inpatient Procedure: 2D Echo Indications:    stroke  History:        Patient has prior history of Echocardiogram examinations, most                 recent 09/23/2020. CAD; Risk Factors:Hypertension, Dyslipidemia                 and Diabetes.  Sonographer:    Delcie Roch RDCS Referring Phys: [id number] CHING T TU  Sonographer Comments: Suboptimal subcostal window and Technically difficult study due to poor echo windows. Image acquisition challenging due to respiratory motion. IMPRESSIONS  1. Left ventricular ejection fraction, by estimation, is 60 to 65%. The left ventricle has normal function. The left ventricle has no regional wall motion abnormalities. Left ventricular diastolic parameters were normal.  2. Right ventricular systolic function is normal. The right ventricular size is normal.  3. The mitral valve is normal in structure. No evidence of mitral valve  regurgitation.  4. The aortic valve was not well visualized. Aortic valve regurgitation is not visualized.  5. IVC not well visualized. Conclusion(s)/Recommendation(s): No change from prior TEE. FINDINGS  Left Ventricle: Left ventricular ejection fraction, by estimation, is 60 to 65%. The left ventricle has normal function. The left ventricle has no  regional wall motion abnormalities. The left ventricular internal cavity size was normal in size. There is  no left ventricular hypertrophy. Left ventricular diastolic parameters were normal. Right Ventricle: The right ventricular size is normal. Right ventricular systolic function is normal. Left Atrium: Left atrial size was normal in size. Right Atrium: Right atrial size was normal in size. Pericardium: There is no evidence of pericardial effusion. Mitral Valve: The mitral valve is normal in structure. No evidence of mitral valve regurgitation. Tricuspid Valve: Tricuspid valve regurgitation is not demonstrated. Aortic Valve: The aortic valve was not well visualized. Aortic valve regurgitation is not visualized. Pulmonic Valve: The pulmonic valve was not well visualized. Pulmonic valve regurgitation is not visualized. Aorta: The aortic root and ascending aorta are structurally normal, with no evidence of dilitation. Venous: IVC not well visualized. IAS/Shunts: The interatrial septum was not well visualized.  LEFT VENTRICLE PLAX 2D LVIDd:         4.10 cm   Diastology LVIDs:         2.60 cm   LV e' medial:    7.51 cm/s LV PW:         1.00 cm   LV E/e' medial:  8.5 LV IVS:        0.90 cm   LV e' lateral:   14.00 cm/s LVOT diam:     1.90 cm   LV E/e' lateral: 4.6 LV SV:         58 LV SV Index:   28 LVOT Area:     2.84 cm  RIGHT VENTRICLE RV S prime:     10.10 cm/s TAPSE (M-mode): 1.5 cm LEFT ATRIUM           Index        RIGHT ATRIUM           Index LA diam:      3.50 cm 1.70 cm/m   RA Area:     12.60 cm LA Vol (A4C): 30.4 ml 14.77 ml/m  RA Volume:   28.00 ml  13.60  ml/m  AORTIC VALVE LVOT Vmax:   99.20 cm/s LVOT Vmean:  67.600 cm/s LVOT VTI:    0.206 m  AORTA Ao Root diam: 3.30 cm MITRAL VALVE MV Area (PHT): 3.85 cm    SHUNTS MV Decel Time: 197 msec    Systemic VTI:  0.21 m MV E velocity: 64.20 cm/s  Systemic Diam: 1.90 cm MV A velocity: 57.70 cm/s MV E/A ratio:  1.11 Photographer signed by Carolan Clines Signature Date/Time: 09/12/2021/11:48:29 AM    Final    MR BRAIN WO CONTRAST  Result Date: 09/11/2021 CLINICAL DATA:  Initial evaluation for neuro deficit, stroke suspected. EXAM: MRI HEAD WITHOUT CONTRAST TECHNIQUE: Multiplanar, multiecho pulse sequences of the brain and surrounding structures were obtained without intravenous contrast. COMPARISON:  Prior CTs from earlier the same day. FINDINGS: Brain: Cerebral volume within normal limits. Scattered T2/FLAIR hyperintensity involving the supratentorial cerebral white matter, consistent with chronic small vessel ischemic disease. Few scatter remote lacunar infarcts present about the right corona radiata, basal ganglia, thalami, and pons. 6 mm focus of restricted diffusion at the left ventral pons/midbrain consistent with an acute ischemic infarct. No associated hemorrhage or mass effect. No other evidence for acute or subacute ischemia. Gray-white matter differentiation otherwise maintained. No acute intracranial hemorrhage. No mass lesion, midline shift or mass effect. No hydrocephalus or extra-axial fluid collection. Pituitary gland and suprasellar region within normal limits. Vascular: Major intracranial vascular flow voids are  maintained. Skull and upper cervical spine: Craniocervical junction normal. Bone marrow signal intensity within normal limits. No scalp soft tissue abnormality. Sinuses/Orbits: Globes orbital soft tissues within normal limits. Scattered mucosal thickening noted about the ethmoidal air cells and maxillary sinuses. No mastoid effusion. Other: None. IMPRESSION: 1. 6 mm acute ischemic  nonhemorrhagic left ventral pons/midbrain infarct. 2. Underlying chronic microvascular ischemic disease with multiple remote lacunar infarcts involving the right corona radiata, basal ganglia, thalami, and pons. Electronically Signed   By: Rise Mu M.D.   On: 09/11/2021 19:27   CT ANGIO HEAD NECK W WO CM W PERF (CODE STROKE)  Result Date: 09/11/2021 CLINICAL DATA:  Code stroke. Slurred speech. Last known well 3 a.m. EXAM: CT ANGIOGRAPHY HEAD AND NECK CT PERFUSION BRAIN TECHNIQUE: Multidetector CT imaging of the head and neck was performed using the standard protocol during bolus administration of intravenous contrast. Multiplanar CT image reconstructions and MIPs were obtained to evaluate the vascular anatomy. Carotid stenosis measurements (when applicable) are obtained utilizing NASCET criteria, using the distal internal carotid diameter as the denominator. Multiphase CT imaging of the brain was performed following IV bolus contrast injection. Subsequent parametric perfusion maps were calculated using RAPID software. RADIATION DOSE REDUCTION: This exam was performed according to the departmental dose-optimization program which includes automated exposure control, adjustment of the mA and/or kV according to patient size and/or use of iterative reconstruction technique. CONTRAST:  OMNIPAQUE IOHEXOL 350 MG/ML SOLN COMPARISON:  CT head without contrast 09/11/2021. MR head without contrast 08/26/2020. MRA head without contrast 08/27/2020 FINDINGS: CTA NECK FINDINGS Aortic arch: 3 vessel arch configuration is present. No significant atherosclerotic changes are present at the arch. No aneurysm or stenosis. Right carotid system: The right common carotid artery is within normal limits. Bifurcation is unremarkable. Cervical right ICA is normal. Left carotid system: The left common carotid artery is within normal limits. The bifurcation is unremarkable. Cervical left ICA is normal. Vertebral arteries:  The left vertebral artery is dominant. Both vertebral arteries originate from the subclavian arteries without significant stenosis. No significant stenosis is present in either vertebral artery in the neck. Skeleton: Vertebral body heights and alignment are normal. No focal osseous lesions present. The patient is edentulous. Other neck: Soft tissues the neck are otherwise unremarkable. Salivary glands are within normal limits. Thyroid is normal. No significant adenopathy is present. No focal mucosal or submucosal lesions are present. Upper chest: Lung apices are clear. Thoracic inlet is within normal limits. Review of the MIP images confirms the above findings CTA HEAD FINDINGS Anterior circulation: The carotid arteries are within normal limits the skull base through the ICA termini. The A1 and M1 segments are normal. The anterior communicating artery is patent. MCA bifurcations are within normal limits bilaterally. The ACA and MCA branch vessels are normal. Posterior circulation: The left vertebral artery is the dominant vessel. The right vertebral artery terminates at the PICA. Left PICA origin is visualized and normal. The left vertebral artery becomes the basilar artery. Basilar artery is normal. Both posterior cerebral arteries originate from basilar tip. The PCA branch vessels are within normal limits bilaterally. Venous sinuses: The dural sinuses are patent. The straight sinus deep cerebral veins are intact. Cortical veins are within normal limits. No significant vascular malformation is evident. Anatomic variants: None Review of the MIP images confirms the above findings CT Brain Perfusion Findings: The field of view is lower than typical protocol. Software could not calculate perfusion mass. IMPRESSION: 1. No emergent large vessel occlusion. 2. No  significant stenosis in the neck or circle-of-Willis. 3. CT perfusion maps could not be calculated due to technical factors related to the field of view. The  patient could be re-scanned for perfusion if clinically indicated. Electronically Signed   By: Marin Roberts M.D.   On: 09/11/2021 18:27   CT HEAD CODE STROKE WO CONTRAST  Result Date: 09/11/2021 CLINICAL DATA:  Code stroke. Slurred speech. Last known well 3 a.m. EXAM: CT HEAD WITHOUT CONTRAST TECHNIQUE: Contiguous axial images were obtained from the base of the skull through the vertex without intravenous contrast. RADIATION DOSE REDUCTION: This exam was performed according to the departmental dose-optimization program which includes automated exposure control, adjustment of the mA and/or kV according to patient size and/or use of iterative reconstruction technique. COMPARISON:  CT head without contrast 11/16/2020. MR head without contrast 08/26/2020 FINDINGS: Brain: Remote internal capsule lacunar infarcts noted bilaterally. Remote left thalamic lacunar infarct noted. Mild atrophy and diffuse white matter disease is otherwise stable. Basal ganglia are otherwise intact. Insular ribbon is normal. Acute or focal cortical abnormality is present. The ventricles are of normal size. No significant extraaxial fluid collection is present. The brainstem and cerebellum are within normal limits. Vascular: Atherosclerotic calcifications are present within the cavernous internal carotid arteries bilaterally. No hyperdense vessel is present. Skull: Calvarium is intact. No focal lytic or blastic lesions are present. No significant extracranial soft tissue lesion is present. Sinuses/Orbits: The paranasal sinuses and mastoid air cells are clear. The globes and orbits are within normal limits. ASPECTS [facility] Stroke Program Early CT Score) - Ganglionic level infarction (caudate, lentiform nuclei, internal capsule, insula, M1-M3 cortex): 7/7 - Supraganglionic infarction (M4-M6 cortex): 3/3 Total score (0-10 with 10 being normal): 10/10 IMPRESSION: 1. No acute intracranial abnormality or significant interval change. 2.  Stable atrophy and diffuse white matter disease. 3. Remote lacunar infarcts involving the internal capsule bilaterally and left thalamus. 4. Aspects 10/10 * The above was relayed via text pager to Dr. Amada Jupiter on 09/11/2021 at 17:30 . Electronically Signed   By: Marin Roberts M.D.   On: 09/11/2021 17:31

## 2021-09-14 LAB — CBC WITH DIFFERENTIAL/PLATELET
Abs Immature Granulocytes: 0.03 10*3/uL (ref 0.00–0.07)
Basophils Absolute: 0.1 10*3/uL (ref 0.0–0.1)
Basophils Relative: 1 %
Eosinophils Absolute: 0.5 10*3/uL (ref 0.0–0.5)
Eosinophils Relative: 5 %
HCT: 42.3 % (ref 39.0–52.0)
Hemoglobin: 15.2 g/dL (ref 13.0–17.0)
Immature Granulocytes: 0 %
Lymphocytes Relative: 37 %
Lymphs Abs: 3.8 10*3/uL (ref 0.7–4.0)
MCH: 31.9 pg (ref 26.0–34.0)
MCHC: 35.9 g/dL (ref 30.0–36.0)
MCV: 88.9 fL (ref 80.0–100.0)
Monocytes Absolute: 0.6 10*3/uL (ref 0.1–1.0)
Monocytes Relative: 6 %
Neutro Abs: 5.4 10*3/uL (ref 1.7–7.7)
Neutrophils Relative %: 51 %
Platelets: 224 10*3/uL (ref 150–400)
RBC: 4.76 MIL/uL (ref 4.22–5.81)
RDW: 12.4 % (ref 11.5–15.5)
WBC: 10.4 10*3/uL (ref 4.0–10.5)
nRBC: 0 % (ref 0.0–0.2)

## 2021-09-14 LAB — BASIC METABOLIC PANEL
Anion gap: 7 (ref 5–15)
BUN: 8 mg/dL (ref 6–20)
CO2: 25 mmol/L (ref 22–32)
Calcium: 9.1 mg/dL (ref 8.9–10.3)
Chloride: 109 mmol/L (ref 98–111)
Creatinine, Ser: 0.72 mg/dL (ref 0.61–1.24)
GFR, Estimated: >60 mL/min (ref >60)
Glucose, Bld: 97 mg/dL (ref 70–99)
Potassium: 3.3 mmol/L — ABNORMAL LOW (ref 3.5–5.1)
Sodium: 141 mmol/L (ref 135–145)

## 2021-09-14 LAB — GLUCOSE, CAPILLARY
Glucose-Capillary: 134 mg/dL — ABNORMAL HIGH (ref 70–99)
Glucose-Capillary: 162 mg/dL — ABNORMAL HIGH (ref 70–99)
Glucose-Capillary: 169 mg/dL — ABNORMAL HIGH (ref 70–99)
Glucose-Capillary: 156 mg/dL — ABNORMAL HIGH (ref 70–99)

## 2021-09-14 LAB — MAGNESIUM: Magnesium: 1.6 mg/dL — ABNORMAL LOW (ref 1.7–2.4)

## 2021-09-14 MED ORDER — BUPROPION HCL ER (XL) 150 MG PO TB24
150.0000 mg | ORAL_TABLET | Freq: Every day | ORAL | Status: DC
  Administered 2021-09-14 – 2021-09-28 (×15): 150 mg via ORAL
  Filled 2021-09-14 (×15): qty 1

## 2021-09-14 MED ORDER — MAGNESIUM SULFATE 4 GM/100ML IV SOLN
4.0000 g | Freq: Once | INTRAVENOUS | Status: AC
  Administered 2021-09-14: 4 g via INTRAVENOUS
  Filled 2021-09-14: qty 100

## 2021-09-14 MED ORDER — POTASSIUM CHLORIDE CRYS ER 20 MEQ PO TBCR
40.0000 meq | EXTENDED_RELEASE_TABLET | Freq: Once | ORAL | Status: AC
  Administered 2021-09-14: 40 meq via ORAL
  Filled 2021-09-14: qty 2

## 2021-09-14 NOTE — Progress Notes (Addendum)
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 CTA head and neck with no large vessel occlusion. PMH significant for STEMI s/p RCA stent 2014, HTN, recurrent strokes, insulin-dependent DM2, hyperlipidemia, cocaine use.   OT comments  Pt progressing towards goals, able to complete ambulation in room and seated grooming task at sink with min A and RW. Pt impulsive, sitting/standing without warning during session needing cues for safety. Pt with difficulty using (dominant) RUE for combing hair, needing LUE to assist, increased time needed to coordinate combing motion, initially undershooting when looking in mirror. Pt with increased oral secretions (drooling) and posterior LOB when dual tasking. Pt presenting with impairments listed below, will follow acutely. Continue to recommend AIR at d/c.   Recommendations for follow up therapy are one component of a multi-disciplinary discharge planning process, led by the attending physician.  Recommendations may be updated based on patient status, additional functional criteria and insurance authorization.    Follow Up Recommendations  Acute inpatient rehab (3hours/day)    Assistance Recommended at Discharge Frequent or constant Supervision/Assistance  Patient can return home with the following  A lot of help with walking and/or transfers;A lot of help with bathing/dressing/bathroom;Assistance with cooking/housework;Direct supervision/assist for medications management;Direct supervision/assist for financial management;Assist for transportation;Help with stairs or ramp for entrance;Assistance with feeding   Equipment Recommendations  Other (comment);None recommended by OT (defer)    Recommendations for Other  Services Rehab consult;PT consult    Precautions / Restrictions Precautions Precautions: Fall Restrictions Weight Bearing Restrictions: No       Mobility Bed Mobility Overal bed mobility: Needs Assistance Bed Mobility: Supine to Sit, Sit to Supine     Supine to sit: Mod assist Sit to supine: Mod assist   General bed mobility comments: mod A for trunk elevation, cues to scoot hips forward at EOB    Transfers Overall transfer level: Needs assistance Equipment used: Rolling walker (2 wheels) Transfers: Sit to/from Stand Sit to Stand: Min assist                 Balance Overall balance assessment: Needs assistance Sitting-balance support: Bilateral upper extremity supported, Feet supported Sitting balance-Leahy Scale: Fair Sitting balance - Comments: post lean, cannot reach outside BOS without LOB   Standing balance support: Bilateral upper extremity supported, During functional activity Standing balance-Leahy Scale: Poor Standing balance comment: reliant on external support                           ADL either performed or assessed with clinical judgement   ADL Overall ADL's : Needs assistance/impaired     Grooming: Minimal assistance;Sitting;Standing Grooming Details (indicate cue type and reason): washes face and combs hair standing initially, then seated at sink                             Functional mobility during ADLs: Minimal assistance;Rolling walker (2 wheels);Cueing for safety;Cueing for sequencing      Extremity/Trunk Assessment Upper Extremity Assessment Upper Extremity Assessment: Generalized weakness RUE Deficits / Details: ~30* shoulder AROM, 3/5 ROM/strength in elbow/wrist/hand, noted decreased use of RUE compared to LUE, supports RUE with LUE during grooming task RUE Coordination: decreased fine motor;decreased gross motor  LUE Deficits / Details: ~30* shoulder AROM LUE Coordination: decreased gross motor   Lower  Extremity Assessment Lower Extremity Assessment: Defer to PT evaluation        Vision   Vision Assessment?: Yes Eye Alignment: Within Functional Limits Ocular Range of Motion: Impaired-to be further tested in functional context Depth Perception: Undershoots   Perception Perception Perception: Not tested   Praxis Praxis Praxis: Not tested    Cognition Arousal/Alertness: Lethargic Behavior During Therapy: Flat affect Overall Cognitive Status: Impaired/Different from baseline Area of Impairment: Attention, Following commands, Safety/judgement, Awareness, Problem solving                   Current Attention Level: Sustained   Following Commands: Follows one step commands with increased time Safety/Judgement: Decreased awareness of safety Awareness: Intellectual Problem Solving: Slow processing, Difficulty sequencing, Decreased initiation, Requires verbal cues General Comments: increased time for all communication/task initiation        Exercises      Shoulder Instructions       General Comments VSS on RA    Pertinent Vitals/ Pain       Pain Assessment Pain Assessment: No/denies pain Faces Pain Scale: No hurt  Home Living                                          Prior Functioning/Environment              Frequency  Min 2X/week        Progress Toward Goals  OT Goals(current goals can now be found in the care plan section)  Progress towards OT goals: Progressing toward goals  Acute Rehab OT Goals Patient Stated Goal: none stated OT Goal Formulation: With patient Time For Goal Achievement: 09/26/21 Potential to Achieve Goals: Good ADL Goals Pt Will Perform Upper Body Dressing: with min assist;sitting Pt Will Perform Lower Body Dressing: with min assist;sitting/lateral leans;sit to/from stand Pt Will Transfer to Toilet: with min assist;regular height toilet;ambulating Pt Will Perform Tub/Shower Transfer: Tub transfer;Shower  transfer;rolling walker;ambulating Additional ADL Goal #1: pt will perform 3 step trailmaking task in prep for ADLs  Plan Discharge plan remains appropriate;Frequency remains appropriate    Co-evaluation                 AM-PAC OT "6 Clicks" Daily Activity     Outcome Measure   Help from another person eating meals?: A Little Help from another person taking care of personal grooming?: A Little Help from another person toileting, which includes using toliet, bedpan, or urinal?: A Lot Help from another person bathing (including washing, rinsing, drying)?: A Lot Help from another person to put on and taking off regular upper body clothing?: A Lot Help from another person to put on and taking off regular lower body clothing?: A Lot 6 Click Score: 14    End of Session Equipment Utilized During Treatment: Gait belt;Rolling walker (2 wheels)  OT Visit Diagnosis: Unsteadiness on feet (R26.81);Other abnormalities of gait and mobility (R26.89);Muscle weakness (generalized) (M62.81);Other symptoms and signs involving cognitive function;Cognitive communication deficit (R41.841) Symptoms and signs involving cognitive functions: Cerebral infarction   Activity Tolerance Patient tolerated treatment well   Patient Left in chair;with call bell/phone within reach;with chair alarm set   Nurse Communication Mobility status        Time: 9562-1308 OT Time Calculation (min): 32 min  Charges: OT General  Charges $OT Visit: 1 Visit OT Treatments $Self Care/Home Management : 23-37 mins  Alfonzo Beers, OTD, OTR/L Acute Rehab [phone]   Mayer Masker 09/14/2021, 4:16 PM

## 2021-09-14 NOTE — Progress Notes (Signed)
Inpatient Rehab Coordinator Note:  Late entry:  I met with patient yesterday at bedside to discuss CIR recommendations and goals/expectations of CIR stay.  We reviewed 3 hrs/day of therapy, physician follow up, and average length of stay 2 weeks (dependent upon progress) with goals of supervision.  Pt states he lives with his mother's significant other (mother is deceased), and that he has a brother and nephew also close by.  He is open to CIR, and we discussed cost of rehab when uninsured as well as medicaid process.  Pt appears to understand, but I will also speak to his brother to discuss cost and verify caregiver support available at discharge.   Shann Medal, PT, DPT Admissions Coordinator [phone] 09/14/21  9:01 AM

## 2021-09-14 NOTE — Progress Notes (Addendum)
Inpatient Rehab Admissions Coordinator:   Left message for pt's brother to discuss caregiver support and rehab cost.    1035: Was able to speak with pts brother, Zackory Pudlo ([phone]), regarding caregiver support at discharge.  Fayrene Fearing reports that he works 40 hrs/week overnight, and has no room to bring pt home with him.  Unfortunately the only support pt would have is in his previous living environment, with [name], which Fayrene Fearing says he does not want pt to return to due to safety concerns.  We discussed possibility of looking into longer term SNF options with potential to transition to LTC if needed and he is open to discussing this.  I let TOC and MD know.  CIR will sign off at this time, please contact me with questions.   Estill Dooms, PT, DPT Admissions Coordinator [phone] 09/14/21  10:15 AM

## 2021-09-14 NOTE — Progress Notes (Signed)
Physical Therapy Treatment Patient Details Name: Martin Mullen MRN: [id number] DOB: 1980-09-28 Today's Date: 09/14/2021   History of Present Illness Pt is a 41 y/o M presenting to ED on 7/29 with slurred speech, MRI revealing 40mm acute inschemic nonhemorrhagic L ventral pons/midbrain infarct. CTA head and neck with no large vessel occlusion. PMH significant for STEMI s/p RCA stent 2014, HTN, recurrent strokes, insulin-dependent DM2, hyperlipidemia, cocaine use.    PT Comments    Pt was seen after being up in chair and fatigued from OT session, asking to return to bed.  Pt is apparently very tired today and has not tolerated as much activity.  He is able to stand and maneuver to the bed but is in a flexed posture, struggling to maintain standing during the transition.  Despite this, pt is following direction with a little extra time, and with more supported effort from his limited endurance for standing.  Will recommend possibly a stedy to stand and practice some extended time as well as working on gait on RW with a close guard of chair.  Recommendations for follow up therapy are one component of a multi-disciplinary discharge planning process, led by the attending physician.  Recommendations may be updated based on patient status, additional functional criteria and insurance authorization.  Follow Up Recommendations  Acute inpatient rehab (3hours/day)     Assistance Recommended at Discharge Frequent or constant Supervision/Assistance  Patient can return home with the following A lot of help with walking and/or transfers;A lot of help with bathing/dressing/bathroom;Assistance with cooking/housework;Direct supervision/assist for medications management;Direct supervision/assist for financial management;Assist for transportation;Help with stairs or ramp for entrance   Equipment Recommendations  None recommended by PT    Recommendations for Other Services Rehab consult     Precautions /  Restrictions Precautions Precautions: Fall Restrictions Weight Bearing Restrictions: No     Mobility  Bed Mobility Overal bed mobility: Needs Assistance Bed Mobility: Supine to Sit, Sit to Supine     Supine to sit: Mod assist Sit to supine: Mod assist   General bed mobility comments: mod assist back to bed to lift legs and reposition trunk due to fatigue    Transfers Overall transfer level: Needs assistance Equipment used: Rolling walker (2 wheels) Transfers: Sit to/from Stand Sit to Stand: Mod assist (mod assist with dense cues and mult attempts, more a challenge from lower surfaces)           General transfer comment: instructed hand placement and sequence    Ambulation/Gait               General Gait Details: unabel to try much more than steps to transfer due to fatigue and tendency to sit quickly   Stairs             Wheelchair Mobility    Modified Rankin (Stroke Patients Only)       Balance Overall balance assessment: Needs assistance Sitting-balance support: Bilateral upper extremity supported, Feet supported Sitting balance-Leahy Scale: Fair     Standing balance support: Bilateral upper extremity supported, During functional activity Standing balance-Leahy Scale: Poor                              Cognition Arousal/Alertness: Lethargic Behavior During Therapy: Flat affect Overall Cognitive Status: Impaired/Different from baseline Area of Impairment: Safety/judgement, Following commands, Awareness, Problem solving, Attention  Current Attention Level: Selective, Sustained   Following Commands: Follows one step commands with increased time Safety/Judgement: Decreased awareness of safety, Decreased awareness of deficits Awareness: Intellectual Problem Solving: Slow processing, Decreased initiation, Difficulty sequencing, Requires verbal cues, Requires tactile cues General Comments: slow verbally and  with motor responses        Exercises      General Comments General comments (skin integrity, edema, etc.): pt was seen for mobility on RW with transfers to bed requested due to pt being tired, assisted to sit on side of bed with almost an airplane appearance to the transition.  Reattempted to stand and could not get pt fully up to stand      Pertinent Vitals/Pain Pain Assessment Pain Assessment: No/denies pain    Home Living                          Prior Function            PT Goals (current goals can now be found in the care plan section)      Frequency    Min 4X/week      PT Plan Current plan remains appropriate    Co-evaluation              AM-PAC PT "6 Clicks" Mobility   Outcome Measure  Help needed turning from your back to your side while in a flat bed without using bedrails?: A Lot Help needed moving from lying on your back to sitting on the side of a flat bed without using bedrails?: A Lot Help needed moving to and from a bed to a chair (including a wheelchair)?: A Lot Help needed standing up from a chair using your arms (e.g., wheelchair or bedside chair)?: A Lot Help needed to walk in hospital room?: A Lot Help needed climbing 3-5 steps with a railing? : Total 6 Click Score: 11    End of Session   Activity Tolerance: Patient tolerated treatment well;Patient limited by fatigue Patient left: in bed;with call bell/phone within reach;with bed alarm set Nurse Communication: Mobility status PT Visit Diagnosis: Other abnormalities of gait and mobility (R26.89);Other symptoms and signs involving the nervous system (P92.924)     Time: 4628-6381 PT Time Calculation (min) (ACUTE ONLY): 20 min  Charges:  $Therapeutic Activity: 8-22 mins       Ivar Drape 09/14/2021, 3:22 PM  Samul Dada, PT PhD Acute Rehab Dept. Number: [facility] [id number] and [facility] [phone]

## 2021-09-14 NOTE — Progress Notes (Addendum)
PROGRESS NOTE                                                                                                                                                                                                             Patient Demographics:    Martin Mullen, is a 41 y.o. male, DOB - 09/10/1980, LOV:[id number]  Outpatient Primary MD for the patient is [name], NP    LOS - 2  Admit date - 09/11/2021    Chief Complaint  Patient presents with   Code Stroke       Brief Narrative (HPI from H&P) -  41 y.o. 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    MRI brain was revealing for 6 mm acute ischemic nonhemorrhagic left ventral pons/midbrain infarct.  CTA head and neck with no large vessel occlusion.   Subjective:   Patient in bed, appears comfortable, denies any headache, no fever, no chest pain or pressure, no shortness of breath , no abdominal pain. No focal weakness.   Assessment  & Plan :    Stroke   - 6 mm acute ischemic non-hemorrhagic left ventral pons/midbrain Infarct.  He has history of previous stroke, he also has history of prothrombin gene mutation, history of noncompliance with medications.  He does seem to have an acute stroke now, confirms that he was not taking any medications for 3 weeks prior to this hospital admission, counseled on compliance, currently on combination of aspirin and Eliquis, Crestor and insulin regimen also resumed.  A1c is greater than 14 LDL above goal. Echo stable full stroke work-up done.  Await placement to SNF/CIR.  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  They advise keeping on Eliquis  indefinitely.  HTN (hypertension)  Allowing for permissive hypertension  STEMI s/p RCA stent 08/2012 - Asymptomatic from a cardiac standpoint.  Continue Eliquis and aspirin combination along with statin for secondary prevention, ARB held to allow for permissive hypertension..  Dehydration with hypokalemia.  Hydrated with IV fluids.  Potassium replaced.  Type 2 diabetes mellitus with hyperlipidemia - poor control in the outpatient setting as evident by the extremely  high A1c, he was not taking any medications for 3 weeks, counseled on compliance, placed on long-acting insulin and sliding scale.  Monitor and adjust  CBG (last 3)  Recent Labs    09/13/21 1559 09/13/21 2243 09/14/21 0728  GLUCAP 151* 153* 134*   Lab Results  Component Value Date   HGBA1C >14 08/05/2021         Condition - Extremely Guarded  Family Communication  :  friend Martin Mullen - [phone]  - on 09/12/21  Code Status :  Full  Consults  :  Neuro  PUD Prophylaxis : Eliquis   Procedures  :     MRI - 1. 6 mm acute ischemic nonhemorrhagic left ventral pons/midbrain infarct. 2. Underlying chronic microvascular ischemic disease with multiple remote lacunar infarcts involving the right corona radiata, basal ganglia, thalami, and pons.  CTA - 1. No acute intracranial abnormality or significant interval change. 2. Stable atrophy and diffuse white matter disease. 3. Remote lacunar infarcts involving the internal capsule bilaterally and left thalamus. 4. Aspects 10/10   TTE - 1. Left ventricular ejection fraction, by estimation, is 60 to 65%. The left ventricle has normal function. The left ventricle has no regional wall motion abnormalities. Left ventricular diastolic parameters were normal.  2. Right ventricular systolic function is normal. The right ventricular size is normal.  3. The mitral valve is normal in structure. No evidence of mitral valve regurgitation.  4. The aortic valve was not well visualized. Aortic  valve regurgitation is not visualized.  5. IVC not well visualized.       Disposition Plan  :    Status is: Observation  DVT Prophylaxis  :     apixaban (ELIQUIS) tablet 5 mg   Lab Results  Component Value Date   PLT 224 09/14/2021    Diet :  Diet Order             DIET - DYS 1 Room service appropriate? No; Fluid consistency: Nectar Thick  Diet effective now                    Inpatient Medications  Scheduled Meds:   stroke: early stages of recovery book   Does not apply Once   apixaban  5 mg Oral BID   aspirin EC  81 mg Oral Daily   insulin aspart  0-20 Units Subcutaneous TID PC & HS   insulin glargine-yfgn  12 Units Subcutaneous Daily   nicotine  21 mg Transdermal Daily   rosuvastatin  20 mg Oral Daily   sodium chloride flush  3 mL Intravenous Once   Continuous Infusions:   PRN Meds:.acetaminophen **OR** acetaminophen (TYLENOL) oral liquid 160 mg/5 mL **OR** acetaminophen, food thickener  Antibiotics  :    Anti-infectives (From admission, onward)    None        Time Spent in minutes  30   Susa Raring M.D on 09/14/2021 at 10:29 AM  To page go to [email or web address]   [facility] -  Office  [phone]  See all Orders from today for further details    Objective:   Vitals:   09/13/21 2016 09/13/21 2357 09/14/21 0429 09/14/21 0724  BP: 116/70 (!) 142/96 107/78 (!) 141/91  Pulse: 69 100 64 75  Resp: 16 16 18    Temp: 98.4 F (36.9 C) 98.1 F (36.7 C) 97.8 F (36.6 C) 97.8 F (36.6 C)  TempSrc: Oral Oral Oral Oral  SpO2: 96% 98% 97%   Weight:  Height:        Wt Readings from Last 3 Encounters:  09/11/21 87.8 kg  08/05/21 88.5 kg  12/23/20 107 kg     Intake/Output Summary (Last 24 hours) at 09/14/2021 1029 Last data filed at 09/14/2021 0658 Gross per 24 hour  Intake --  Output 1050 ml  Net -1050 ml      Physical Exam  Awake Alert, he has acute on chronic right-sided weakness are much weaker as compared to his right  leg, strength in the right lower extremity 4/5 upper extremity 3.5/5, mild right facial droop and mild ptosis Martin Mullen.AT,PERRAL Supple Neck, No JVD,   Symmetrical Chest wall movement, Good air movement bilaterally, CTAB RRR,No Gallops, Rubs or new Murmurs,  +ve B.Sounds, Abd Soft, No tenderness,   No Cyanosis, Clubbing or edema     Data Review:    CBC Recent Labs  Lab 09/11/21 1719 09/11/21 1729 09/14/21 0204  WBC 8.1  --  10.4  HGB 14.9 14.3 15.2  HCT 42.6 42.0 42.3  PLT 213  --  224  MCV 90.4  --  88.9  MCH 31.6  --  31.9  MCHC 35.0  --  35.9  RDW 12.5  --  12.4  LYMPHSABS 3.0  --  3.8  MONOABS 0.5  --  0.6  EOSABS 0.7*  --  0.5  BASOSABS 0.1  --  0.1    Electrolytes Recent Labs  Lab 09/11/21 1719 09/11/21 1729 09/14/21 0204  NA 137 138 141  K 3.6 3.6 3.3*  CL 106 103 109  CO2 23  --  25  GLUCOSE 266* 266* 97  BUN 5* 4* 8  CREATININE 0.76 0.60* 0.72  CALCIUM 8.5*  --  9.1  AST 9*  --   --   ALT 6  --   --   ALKPHOS 101  --   --   BILITOT 0.3  --   --   ALBUMIN 2.8*  --   --   MG  --   --  1.6*  INR 1.0  --   --     ------------------------------------------------------------------------------------------------------------------ Recent Labs    09/12/21 0419  CHOL 124  HDL 27*  LDLCALC 75  TRIG 737  CHOLHDL 4.6    Lab Results  Component Value Date   HGBA1C >14 08/05/2021      Radiology Reports DG Swallowing Func-Speech Pathology  Result Date: 09/12/2021 Table formatting from the original result was not included. Objective Swallowing Evaluation: Type of Study: MBS-Modified Barium Swallow Study  Patient Details Name: Martin Mullen MRN: [id number] Date of Birth: [birth year] Today's Date: 09/12/2021 Time: SLP Start Time (ACUTE ONLY): 1445 -SLP Stop Time (ACUTE ONLY): 1505 SLP Time Calculation (min) (ACUTE ONLY): 20 min Past Medical History: Past Medical History: Diagnosis Date  Acute transmural inferior wall MI (HCC) 08/28/2012  .5 x 12 mm Veri-flex  stent non-DES.  Chicken pox   Coronary artery disease   Inferior ST elevation myocardial infarction in July of 2014. Cardiac catheterization showed 95% distal RCA stenosis and 90% first diagonal stenosis with normal ejection fraction. He underwent PCI in bare-metal stent placement to the distal RCA.  Diabetes mellitus without complication (HCC)   Hypercholesterolemia   Stroke (HCC)   Tobacco use  Past Surgical History: Past Surgical History: Procedure Laterality Date  CORONARY ANGIOPLASTY WITH STENT PLACEMENT    DEBRIDEMENT AND CLOSURE WOUND N/A 09/03/2019  Procedure: Debridement and closure of scrotal wound;  Surgeon: Allena Napoleon, MD;  Location:  [facility];  Service: Plastics;  Laterality: N/A;  DEBRIDEMENT AND CLOSURE WOUND N/A 07/25/2019  Procedure: DEBRIDEMENT AND CLOSURE WOUND;  Surgeon: Allena Napoleon, MD;  Location: [facility];  Service: Plastics;  Laterality: N/A;  INCISION AND DRAINAGE ABSCESS N/A 07/20/2019  Procedure: INCISION AND DRAINAGE ABSCESS;  Surgeon: Crista Elliot, MD;  Location: [facility];  Service: Urology;  Laterality: N/A;  LEFT HEART CATHETERIZATION WITH CORONARY ANGIOGRAM N/A 08/28/2012  Procedure: LEFT HEART CATHETERIZATION WITH CORONARY ANGIOGRAM;  Surgeon: Pamella Pert, MD;  Location: [facility] CATH LAB;  Service: Cardiovascular;  Laterality: N/A;  TEE WITHOUT CARDIOVERSION N/A 09/23/2020  Procedure: TRANSESOPHAGEAL ECHOCARDIOGRAM (TEE);  Surgeon: Lamar Blinks, MD;  Location: [facility] OR[facility];  Service: Cardiovascular;  Laterality: N/A;  WRIST SURGERY  1988 HPI: Patient is a 41 y.o. male with PMH: recurrent strokes, HTN, STEMI s/p RCA stent 2014, DM-2 insulin dependent, HLD, cocaine use. He presented to the hospital on 09/11/21 with slurred speech. MRI brain revealed 50mm acute ischemic nonhemmorhagic left ventral pons/midbrain infarct; CTA head and neck with no large vessel occlusion.  Subjective: awake, alert, cooperative  Recommendations for follow up therapy are one component  of a multi-disciplinary discharge planning process, led by the attending physician.  Recommendations may be updated based on patient status, additional functional criteria and insurance authorization. Assessment / Plan / Recommendation   09/12/2021   4:00 PM Clinical Impressions Clinical Impression Patient presents with mild-mod oral phase and mild pharyngeal phase dysphagia as per this MBS.  During oral phase, patient exhibited brief holding of liquid boluses as he was observed to impulsively try to pour all of barium from cup into mouth before initiating a swallow. He also exhibited delays in anterior to posterior transit of puree solids and mechanical soft solids. With thin liquids, patient exhibited swallow intiation delays mainly at level of pyriform sinus but at times swallow was initiated at level of vallecular sinus. He exhibited fairly consistent flash penetration of thin liquids (PAS 2)  but with full clearance from laryngeal vestibule each time and no significant pharyngeal residuals post initial swallow. With nectar thick liquids and puree solids he exhibited swallow initiation delay at level of vallecular sinus but only one instance of flash penetration with nectar thick liquids. He did exhibit one instance of sensed, trace aspiration which occured during the swallow when patient took sip of thin liquid barium while still having some masticated mechanical soft solid in his mouth. His cough was strong and appeared to result in at least some of aspirate being ejected out  after passing through vocal cords. When performing esophageal sweep with barium tablet, the tablet as well as the puree solids that were taken along with it were observed in approximately upper thoracic portion of esophagus and an additional bite of puree helped to fully transit tablet and puree throuth esophagus. SLP is recommending initiate Dys 1 (puree) solids and nectar thick liquids via cup sips (no straws) at this time. SLP will  follow patient for diet toleration. SLP Visit Diagnosis Dysphagia, oropharyngeal phase (R13.12) Impact on safety and function Mild aspiration risk     09/12/2021   4:00 PM Treatment Recommendations Treatment Recommendations Therapy as outlined in treatment plan below     09/12/2021   4:00 PM Prognosis Prognosis for Safe Diet Advancement Good   09/12/2021   4:00 PM Diet Recommendations SLP Diet Recommendations Dysphagia 1 (Puree) solids;Nectar thick liquid Liquid Administration via No straw;Cup Medication Administration Whole meds with puree Compensations Slow  rate;Small sips/bites Postural Changes Seated upright at 90 degrees     09/12/2021   4:00 PM Other Recommendations Oral Care Recommendations Oral care BID;Staff/trained caregiver to provide oral care Other Recommendations Order thickener from pharmacy;Prohibited food (jello, ice cream, thin soups);Have oral suction available;Clarify dietary restrictions Follow Up Recommendations Acute inpatient rehab (3hours/day) Assistance recommended at discharge Frequent or constant Supervision/Assistance Functional Status Assessment Patient has had a recent decline in their functional status and demonstrates the ability to make significant improvements in function in a reasonable and predictable amount of time.   09/12/2021   4:00 PM Frequency and Duration  Speech Therapy Frequency (ACUTE ONLY) min 2x/week Treatment Duration 2 weeks     09/12/2021   4:00 PM Oral Phase Oral Phase Impaired Oral - Nectar Cup Reduced posterior propulsion Oral - Thin Cup Reduced posterior propulsion;Holding of bolus Oral - Thin Straw Reduced posterior propulsion Oral - Puree Weak lingual manipulation;Reduced posterior propulsion Oral - Mech Soft Weak lingual manipulation;Impaired mastication;Reduced posterior propulsion Oral - Pill Reduced posterior propulsion    09/12/2021   4:00 PM Pharyngeal Phase Pharyngeal Phase Impaired Pharyngeal- Nectar Cup Delayed swallow initiation-vallecula;Delayed swallow  initiation-pyriform sinuses;Reduced airway/laryngeal closure;Penetration/Aspiration during swallow;Pharyngeal residue - valleculae Pharyngeal Material enters airway, remains ABOVE vocal cords then ejected out Pharyngeal- Thin Cup Delayed swallow initiation-pyriform sinuses;Delayed swallow initiation-vallecula;Reduced airway/laryngeal closure;Penetration/Aspiration during swallow;Trace aspiration Pharyngeal Material enters airway, passes BELOW cords and not ejected out despite cough attempt by patient;Material enters airway, passes BELOW cords then ejected out Pharyngeal- Thin Straw Delayed swallow initiation-pyriform sinuses;Delayed swallow initiation-vallecula;Reduced airway/laryngeal closure;Penetration/Aspiration during swallow Pharyngeal Material enters airway, remains ABOVE vocal cords then ejected out Pharyngeal- Puree Delayed swallow initiation-vallecula Pharyngeal- Mechanical Soft Delayed swallow initiation-vallecula Pharyngeal- Pill [facility]    09/12/2021   4:00 PM Cervical Esophageal Phase  Cervical Esophageal Phase [facility] Angela Nevin, MA, CCC-SLP Speech Therapy                     ECHOCARDIOGRAM COMPLETE  Result Date: 09/12/2021    ECHOCARDIOGRAM REPORT   Patient Name:   SHAINE NEWMARK Date of Exam: 09/12/2021 Medical Rec #:  [id number]       Height:       70.0 in Accession #:    [id number]      Weight:       193.6 lb Date of Birth:  1980-09-20       BSA:          2.059 m Patient Age:    41 years        BP:           109/84 mmHg Patient Gender: M               HR:           72 bpm. Exam Location:  Inpatient Procedure: 2D Echo Indications:    stroke  History:        Patient has prior history of Echocardiogram examinations, most                 recent 09/23/2020. CAD; Risk Factors:Hypertension, Dyslipidemia                 and Diabetes.  Sonographer:    Delcie Roch RDCS Referring Phys: [id number] CHING T TU  Sonographer Comments: Suboptimal subcostal window and Technically difficult study due to poor echo  windows. Image acquisition challenging due to respiratory motion. IMPRESSIONS  1. Left ventricular ejection fraction, by estimation, is 60 to 65%.  The left ventricle has normal function. The left ventricle has no regional wall motion abnormalities. Left ventricular diastolic parameters were normal.  2. Right ventricular systolic function is normal. The right ventricular size is normal.  3. The mitral valve is normal in structure. No evidence of mitral valve regurgitation.  4. The aortic valve was not well visualized. Aortic valve regurgitation is not visualized.  5. IVC not well visualized. Conclusion(s)/Recommendation(s): No change from prior TEE. FINDINGS  Left Ventricle: Left ventricular ejection fraction, by estimation, is 60 to 65%. The left ventricle has normal function. The left ventricle has no regional wall motion abnormalities. The left ventricular internal cavity size was normal in size. There is  no left ventricular hypertrophy. Left ventricular diastolic parameters were normal. Right Ventricle: The right ventricular size is normal. Right ventricular systolic function is normal. Left Atrium: Left atrial size was normal in size. Right Atrium: Right atrial size was normal in size. Pericardium: There is no evidence of pericardial effusion. Mitral Valve: The mitral valve is normal in structure. No evidence of mitral valve regurgitation. Tricuspid Valve: Tricuspid valve regurgitation is not demonstrated. Aortic Valve: The aortic valve was not well visualized. Aortic valve regurgitation is not visualized. Pulmonic Valve: The pulmonic valve was not well visualized. Pulmonic valve regurgitation is not visualized. Aorta: The aortic root and ascending aorta are structurally normal, with no evidence of dilitation. Venous: IVC not well visualized. IAS/Shunts: The interatrial septum was not well visualized.  LEFT VENTRICLE PLAX 2D LVIDd:         4.10 cm   Diastology LVIDs:         2.60 cm   LV e' medial:    7.51 cm/s  LV PW:         1.00 cm   LV E/e' medial:  8.5 LV IVS:        0.90 cm   LV e' lateral:   14.00 cm/s LVOT diam:     1.90 cm   LV E/e' lateral: 4.6 LV SV:         58 LV SV Index:   28 LVOT Area:     2.84 cm  RIGHT VENTRICLE RV S prime:     10.10 cm/s TAPSE (M-mode): 1.5 cm LEFT ATRIUM           Index        RIGHT ATRIUM           Index LA diam:      3.50 cm 1.70 cm/m   RA Area:     12.60 cm LA Vol (A4C): 30.4 ml 14.77 ml/m  RA Volume:   28.00 ml  13.60 ml/m  AORTIC VALVE LVOT Vmax:   99.20 cm/s LVOT Vmean:  67.600 cm/s LVOT VTI:    0.206 m  AORTA Ao Root diam: 3.30 cm MITRAL VALVE MV Area (PHT): 3.85 cm    SHUNTS MV Decel Time: 197 msec    Systemic VTI:  0.21 m MV E velocity: 64.20 cm/s  Systemic Diam: 1.90 cm MV A velocity: 57.70 cm/s MV E/A ratio:  1.11 Photographer signed by Carolan Clines Signature Date/Time: 09/12/2021/11:48:29 AM    Final    MR BRAIN WO CONTRAST  Result Date: 09/11/2021 CLINICAL DATA:  Initial evaluation for neuro deficit, stroke suspected. EXAM: MRI HEAD WITHOUT CONTRAST TECHNIQUE: Multiplanar, multiecho pulse sequences of the brain and surrounding structures were obtained without intravenous contrast. COMPARISON:  Prior CTs from earlier the same day. FINDINGS: Brain: Cerebral volume within normal  limits. Scattered T2/FLAIR hyperintensity involving the supratentorial cerebral white matter, consistent with chronic small vessel ischemic disease. Few scatter remote lacunar infarcts present about the right corona radiata, basal ganglia, thalami, and pons. 6 mm focus of restricted diffusion at the left ventral pons/midbrain consistent with an acute ischemic infarct. No associated hemorrhage or mass effect. No other evidence for acute or subacute ischemia. Gray-white matter differentiation otherwise maintained. No acute intracranial hemorrhage. No mass lesion, midline shift or mass effect. No hydrocephalus or extra-axial fluid collection. Pituitary gland and suprasellar region  within normal limits. Vascular: Major intracranial vascular flow voids are maintained. Skull and upper cervical spine: Craniocervical junction normal. Bone marrow signal intensity within normal limits. No scalp soft tissue abnormality. Sinuses/Orbits: Globes orbital soft tissues within normal limits. Scattered mucosal thickening noted about the ethmoidal air cells and maxillary sinuses. No mastoid effusion. Other: None. IMPRESSION: 1. 6 mm acute ischemic nonhemorrhagic left ventral pons/midbrain infarct. 2. Underlying chronic microvascular ischemic disease with multiple remote lacunar infarcts involving the right corona radiata, basal ganglia, thalami, and pons. Electronically Signed   By: Rise Mu M.D.   On: 09/11/2021 19:27   CT ANGIO HEAD NECK W WO CM W PERF (CODE STROKE)  Result Date: 09/11/2021 CLINICAL DATA:  Code stroke. Slurred speech. Last known well 3 a.m. EXAM: CT ANGIOGRAPHY HEAD AND NECK CT PERFUSION BRAIN TECHNIQUE: Multidetector CT imaging of the head and neck was performed using the standard protocol during bolus administration of intravenous contrast. Multiplanar CT image reconstructions and MIPs were obtained to evaluate the vascular anatomy. Carotid stenosis measurements (when applicable) are obtained utilizing NASCET criteria, using the distal internal carotid diameter as the denominator. Multiphase CT imaging of the brain was performed following IV bolus contrast injection. Subsequent parametric perfusion maps were calculated using RAPID software. RADIATION DOSE REDUCTION: This exam was performed according to the departmental dose-optimization program which includes automated exposure control, adjustment of the mA and/or kV according to patient size and/or use of iterative reconstruction technique. CONTRAST:  OMNIPAQUE IOHEXOL 350 MG/ML SOLN COMPARISON:  CT head without contrast 09/11/2021. MR head without contrast 08/26/2020. MRA head without contrast 08/27/2020 FINDINGS:  CTA NECK FINDINGS Aortic arch: 3 vessel arch configuration is present. No significant atherosclerotic changes are present at the arch. No aneurysm or stenosis. Right carotid system: The right common carotid artery is within normal limits. Bifurcation is unremarkable. Cervical right ICA is normal. Left carotid system: The left common carotid artery is within normal limits. The bifurcation is unremarkable. Cervical left ICA is normal. Vertebral arteries: The left vertebral artery is dominant. Both vertebral arteries originate from the subclavian arteries without significant stenosis. No significant stenosis is present in either vertebral artery in the neck. Skeleton: Vertebral body heights and alignment are normal. No focal osseous lesions present. The patient is edentulous. Other neck: Soft tissues the neck are otherwise unremarkable. Salivary glands are within normal limits. Thyroid is normal. No significant adenopathy is present. No focal mucosal or submucosal lesions are present. Upper chest: Lung apices are clear. Thoracic inlet is within normal limits. Review of the MIP images confirms the above findings CTA HEAD FINDINGS Anterior circulation: The carotid arteries are within normal limits the skull base through the ICA termini. The A1 and M1 segments are normal. The anterior communicating artery is patent. MCA bifurcations are within normal limits bilaterally. The ACA and MCA branch vessels are normal. Posterior circulation: The left vertebral artery is the dominant vessel. The right vertebral artery terminates at the PICA. Left  PICA origin is visualized and normal. The left vertebral artery becomes the basilar artery. Basilar artery is normal. Both posterior cerebral arteries originate from basilar tip. The PCA branch vessels are within normal limits bilaterally. Venous sinuses: The dural sinuses are patent. The straight sinus deep cerebral veins are intact. Cortical veins are within normal limits. No  significant vascular malformation is evident. Anatomic variants: None Review of the MIP images confirms the above findings CT Brain Perfusion Findings: The field of view is lower than typical protocol. Software could not calculate perfusion mass. IMPRESSION: 1. No emergent large vessel occlusion. 2. No significant stenosis in the neck or circle-of-Willis. 3. CT perfusion maps could not be calculated due to technical factors related to the field of view. The patient could be re-scanned for perfusion if clinically indicated. Electronically Signed   By: Marin Roberts M.D.   On: 09/11/2021 18:27   CT HEAD CODE STROKE WO CONTRAST  Result Date: 09/11/2021 CLINICAL DATA:  Code stroke. Slurred speech. Last known well 3 a.m. EXAM: CT HEAD WITHOUT CONTRAST TECHNIQUE: Contiguous axial images were obtained from the base of the skull through the vertex without intravenous contrast. RADIATION DOSE REDUCTION: This exam was performed according to the departmental dose-optimization program which includes automated exposure control, adjustment of the mA and/or kV according to patient size and/or use of iterative reconstruction technique. COMPARISON:  CT head without contrast 11/16/2020. MR head without contrast 08/26/2020 FINDINGS: Brain: Remote internal capsule lacunar infarcts noted bilaterally. Remote left thalamic lacunar infarct noted. Mild atrophy and diffuse white matter disease is otherwise stable. Basal ganglia are otherwise intact. Insular ribbon is normal. Acute or focal cortical abnormality is present. The ventricles are of normal size. No significant extraaxial fluid collection is present. The brainstem and cerebellum are within normal limits. Vascular: Atherosclerotic calcifications are present within the cavernous internal carotid arteries bilaterally. No hyperdense vessel is present. Skull: Calvarium is intact. No focal lytic or blastic lesions are present. No significant extracranial soft tissue lesion is  present. Sinuses/Orbits: The paranasal sinuses and mastoid air cells are clear. The globes and orbits are within normal limits. ASPECTS [facility] Stroke Program Early CT Score) - Ganglionic level infarction (caudate, lentiform nuclei, internal capsule, insula, M1-M3 cortex): 7/7 - Supraganglionic infarction (M4-M6 cortex): 3/3 Total score (0-10 with 10 being normal): 10/10 IMPRESSION: 1. No acute intracranial abnormality or significant interval change. 2. Stable atrophy and diffuse white matter disease. 3. Remote lacunar infarcts involving the internal capsule bilaterally and left thalamus. 4. Aspects 10/10 * The above was relayed via text pager to Dr. Amada Jupiter on 09/11/2021 at 17:30 . Electronically Signed   By: Marin Roberts M.D.   On: 09/11/2021 17:31

## 2021-09-14 NOTE — Plan of Care (Signed)

## 2021-09-15 LAB — CBC WITH DIFFERENTIAL/PLATELET
Abs Immature Granulocytes: 0.04 10*3/uL (ref 0.00–0.07)
Basophils Absolute: 0.1 10*3/uL (ref 0.0–0.1)
Basophils Relative: 1 %
Eosinophils Absolute: 0.4 10*3/uL (ref 0.0–0.5)
Eosinophils Relative: 3 %
HCT: 44.1 % (ref 39.0–52.0)
Hemoglobin: 15.3 g/dL (ref 13.0–17.0)
Immature Granulocytes: 0 %
Lymphocytes Relative: 32 %
Lymphs Abs: 3.9 10*3/uL (ref 0.7–4.0)
MCH: 31.5 pg (ref 26.0–34.0)
MCHC: 34.7 g/dL (ref 30.0–36.0)
MCV: 90.7 fL (ref 80.0–100.0)
Monocytes Absolute: 0.7 10*3/uL (ref 0.1–1.0)
Monocytes Relative: 6 %
Neutro Abs: 7.2 10*3/uL (ref 1.7–7.7)
Neutrophils Relative %: 58 %
Platelets: 249 10*3/uL (ref 150–400)
RBC: 4.86 MIL/uL (ref 4.22–5.81)
RDW: 12.7 % (ref 11.5–15.5)
WBC: 12.3 10*3/uL — ABNORMAL HIGH (ref 4.0–10.5)
nRBC: 0 % (ref 0.0–0.2)

## 2021-09-15 LAB — BASIC METABOLIC PANEL
Anion gap: 8 (ref 5–15)
BUN: 6 mg/dL (ref 6–20)
CO2: 23 mmol/L (ref 22–32)
Calcium: 8.8 mg/dL — ABNORMAL LOW (ref 8.9–10.3)
Chloride: 110 mmol/L (ref 98–111)
Creatinine, Ser: 0.74 mg/dL (ref 0.61–1.24)
GFR, Estimated: >60 mL/min (ref >60)
Glucose, Bld: 92 mg/dL (ref 70–99)
Potassium: 3.4 mmol/L — ABNORMAL LOW (ref 3.5–5.1)
Sodium: 141 mmol/L (ref 135–145)

## 2021-09-15 LAB — GLUCOSE, CAPILLARY
Glucose-Capillary: 108 mg/dL — ABNORMAL HIGH (ref 70–99)
Glucose-Capillary: 157 mg/dL — ABNORMAL HIGH (ref 70–99)
Glucose-Capillary: 120 mg/dL — ABNORMAL HIGH (ref 70–99)
Glucose-Capillary: 177 mg/dL — ABNORMAL HIGH (ref 70–99)

## 2021-09-15 LAB — MAGNESIUM: Magnesium: 1.8 mg/dL (ref 1.7–2.4)

## 2021-09-15 MED ORDER — POTASSIUM CHLORIDE CRYS ER 20 MEQ PO TBCR
40.0000 meq | EXTENDED_RELEASE_TABLET | Freq: Once | ORAL | Status: AC
  Administered 2021-09-15: 40 meq via ORAL
  Filled 2021-09-15: qty 2

## 2021-09-15 NOTE — Progress Notes (Signed)
Occupational Therapy Treatment Patient Details Name: Martin Mullen MRN: [id number] DOB: 07/28/1980 Today's Date: 09/15/2021   History of present illness Pt is a 41 y/o M presenting to ED on 7/29 with slurred speech, MRI revealing 37mm acute inschemic nonhemorrhagic L ventral pons/midbrain infarct. CTA head and neck with no large vessel occlusion. PMH significant for STEMI s/p RCA stent 2014, HTN, recurrent strokes, insulin-dependent DM2, hyperlipidemia, cocaine use.   OT comments  Pt progressing towards goals, completed seated and standing ADLs with minA, mod A for bed mobility. Pt with posterior and R lateral lean in standing, fatiguing quickly. Pt with R inattention, and RUE weakness, needing cues to keep RUE on RW with mobility. Pt with slow processing and communication throughout session, however following commands with increased time. Pt presenting with impairments listed below, will follow acutely. Continue to recommend AIR at d/c.   Recommendations for follow up therapy are one component of a multi-disciplinary discharge planning process, led by the attending physician.  Recommendations may be updated based on patient status, additional functional criteria and insurance authorization.    Follow Up Recommendations  Acute inpatient rehab (3hours/day)    Assistance Recommended at Discharge Frequent or constant Supervision/Assistance  Patient can return home with the following  A lot of help with walking and/or transfers;A lot of help with bathing/dressing/bathroom;Assistance with cooking/housework;Direct supervision/assist for medications management;Direct supervision/assist for financial management;Assist for transportation;Help with stairs or ramp for entrance;Assistance with feeding   Equipment Recommendations  Other (comment);BSC/3in1;Wheelchair (measurements OT);Wheelchair cushion (measurements OT) (RW)    Recommendations for Other Services Rehab consult;PT consult    Precautions /  Restrictions Precautions Precautions: Fall Restrictions Weight Bearing Restrictions: No       Mobility Bed Mobility Overal bed mobility: Needs Assistance Bed Mobility: Supine to Sit, Sit to Supine     Supine to sit: Mod assist Sit to supine: Mod assist   General bed mobility comments: mod A for trunk elevation, cues to scoot hips forward at EOB    Transfers Overall transfer level: Needs assistance Equipment used: Rolling walker (2 wheels) Transfers: Sit to/from Stand Sit to Stand: Min assist, +2 safety/equipment, +2 physical assistance           General transfer comment: multiple sit to stands performed during session     Balance Overall balance assessment: Needs assistance Sitting-balance support: Bilateral upper extremity supported, Feet supported Sitting balance-Leahy Scale: Fair Sitting balance - Comments: post lean, cannot reach outside BOS without LOB   Standing balance support: Bilateral upper extremity supported, During functional activity Standing balance-Leahy Scale: Poor Standing balance comment: reliant on external support, post and R lateral lean in standing                           ADL either performed or assessed with clinical judgement   ADL Overall ADL's : Needs assistance/impaired     Grooming: Minimal assistance               Lower Body Dressing: Minimal assistance;Sitting/lateral leans;Sit to/from stand Lower Body Dressing Details (indicate cue type and reason): to pull up socks Toilet Transfer: Minimal assistance;Rolling walker (2 wheels);Ambulation;Regular Teacher, adult education Details (indicate cue type and reason): simulated in room         Functional mobility during ADLs: Minimal assistance;Rolling walker (2 wheels)      Extremity/Trunk Assessment Upper Extremity Assessment Upper Extremity Assessment: Generalized weakness RUE Deficits / Details: ~30* shoulder AROM, 3/5 ROM/strength in elbow/wrist/hand, noted  decreased use of RUE compared to LUE, supports RUE with LUE during grooming task RUE Coordination: decreased fine motor;decreased gross motor LUE Deficits / Details: ~30* shoulder AROM LUE Coordination: decreased gross motor   Lower Extremity Assessment Lower Extremity Assessment: Defer to PT evaluation        Vision   Vision Assessment?: Yes Eye Alignment: Within Functional Limits Saccades: Additional eye shifts occurred during testing Depth Perception: Undershoots Additional Comments: pt with central gaze most of session, delayed tracking and eye movemnt   Perception Perception Perception: Impaired (R inattention)   Praxis Praxis Praxis: Not tested    Cognition Arousal/Alertness: Lethargic Behavior During Therapy: Flat affect Overall Cognitive Status: Impaired/Different from baseline Area of Impairment: Attention, Following commands, Safety/judgement, Awareness, Problem solving                   Current Attention Level: Sustained   Following Commands: Follows one step commands with increased time Safety/Judgement: Decreased awareness of safety Awareness: Intellectual Problem Solving: Slow processing, Difficulty sequencing, Decreased initiation, Requires verbal cues General Comments: increased time for all communication/task initiation        Exercises Exercises: Other exercises Other Exercises Other Exercises: stacking objects x2 with LUE/RUE Other Exercises: WB through RUE for functional reach/problem solving task    Shoulder Instructions       General Comments VSS on RA    Pertinent Vitals/ Pain       Pain Assessment Pain Assessment: No/denies pain  Home Living                                          Prior Functioning/Environment              Frequency  Min 2X/week        Progress Toward Goals  OT Goals(current goals can now be found in the care plan section)  Progress towards OT goals: Progressing toward  goals  Acute Rehab OT Goals Patient Stated Goal: none stated OT Goal Formulation: With patient Time For Goal Achievement: 09/26/21 Potential to Achieve Goals: Good ADL Goals Pt Will Perform Upper Body Dressing: with min assist;sitting Pt Will Perform Lower Body Dressing: with min assist;sitting/lateral leans;sit to/from stand Pt Will Transfer to Toilet: with min assist;regular height toilet;ambulating Pt Will Perform Tub/Shower Transfer: Tub transfer;Shower transfer;rolling walker;ambulating Additional ADL Goal #1: pt will perform 3 step trailmaking task in prep for ADLs  Plan Discharge plan remains appropriate;Frequency remains appropriate    Co-evaluation                 AM-PAC OT "6 Clicks" Daily Activity     Outcome Measure   Help from another person eating meals?: A Little Help from another person taking care of personal grooming?: A Little Help from another person toileting, which includes using toliet, bedpan, or urinal?: A Lot Help from another person bathing (including washing, rinsing, drying)?: A Lot Help from another person to put on and taking off regular upper body clothing?: A Lot Help from another person to put on and taking off regular lower body clothing?: A Lot 6 Click Score: 14    End of Session Equipment Utilized During Treatment: Gait belt;Rolling walker (2 wheels)  OT Visit Diagnosis: Unsteadiness on feet (R26.81);Other abnormalities of gait and mobility (R26.89);Muscle weakness (generalized) (M62.81);Other symptoms and signs involving cognitive function;Cognitive communication deficit (R41.841) Symptoms and signs involving cognitive functions: Cerebral infarction  Activity Tolerance Patient tolerated treatment well   Patient Left in chair;with call bell/phone within reach;with chair alarm set   Nurse Communication Mobility status        Time: [id number] OT Time Calculation (min): 41 min  Charges: OT General Charges $OT Visit: 1 Visit OT  Treatments $Self Care/Home Management : 8-22 mins $Therapeutic Activity: 8-22 mins  Lynnda Child, OTD, OTR/L Acute Rehab [phone]) [phone] - [place] 09/15/2021, 4:45 PM

## 2021-09-15 NOTE — Progress Notes (Signed)
Physical Therapy Treatment Patient Details Name: Martin Mullen MRN: [id number] DOB: 1981/01/22 Today's Date: 09/15/2021   History of Present Illness Pt is a 41 y/o M presenting to ED on 7/29 with slurred speech, MRI revealing 44mm acute inschemic non-hemorrhagic L ventral pons/midbrain infarct. CTA head and neck with no large vessel occlusion. PMH significant for STEMI s/p RCA stent 2014, HTN, recurrent strokes, insulin-dependent DM2, hyperlipidemia, substance use disorder.    PT Comments    Pt received in supine, lethargic but able to be awoken and following 1-step commands with increased time. Pt with slow processing and noted decreased R awareness, needing cues for R quad activation during gait and static standing tasks during self-care at sink. Pt also losing RUE grip on RW at times. Pt with decreased awareness of deficits/fatigue and HR to 137 bpm while standing but denies fatigue, needs cues for activity pacing. Pt continues to benefit from PT services to progress toward functional mobility goals, continue to recommend high intensity post-acute rehab.   Recommendations for follow up therapy are one component of a multi-disciplinary discharge planning process, led by the attending physician.  Recommendations may be updated based on patient status, additional functional criteria and insurance authorization.  Follow Up Recommendations  Acute inpatient rehab (3hours/day)     Assistance Recommended at Discharge Frequent or constant Supervision/Assistance  Patient can return home with the following A lot of help with walking and/or transfers;A lot of help with bathing/dressing/bathroom;Assistance with cooking/housework;Direct supervision/assist for medications management;Direct supervision/assist for financial management;Assist for transportation;Help with stairs or ramp for entrance   Equipment Recommendations  None recommended by PT    Recommendations for Other Services Rehab consult      Precautions / Restrictions Precautions Precautions: Fall Precaution Comments: tendency to R knee buckle Restrictions Weight Bearing Restrictions: No     Mobility  Bed Mobility Overal bed mobility: Needs Assistance Bed Mobility: Supine to Sit, Sit to Supine     Supine to sit: Mod assist, +2 for safety/equipment     General bed mobility comments: mod A for trunk elevation, cues to scoot hips forward at EOB    Transfers Overall transfer level: Needs assistance Equipment used: Rolling walker (2 wheels) Transfers: Sit to/from Stand Sit to Stand: Min assist, +2 safety/equipment, +2 physical assistance           General transfer comment: multiple sit to stands performed during session from different bed/chair heights to RW    Ambulation/Gait Ambulation/Gait assistance: Mod assist, +2 safety/equipment Gait Distance (Feet): 20 Feet (87ft seated break, 41ft) Assistive device: Rolling walker (2 wheels) Gait Pattern/deviations: Step-through pattern, Decreased step length - right, Decreased stride length, Knee hyperextension - right, Knee hyperextension - left, Knees buckling, Decreased weight shift to right, Decreased dorsiflexion - right, Shuffle, Narrow base of support       General Gait Details: mod cues for step sequencing to not leave RLE behind, also cues to lock R knee due to tendency to buckle and for RW management. manual assist needed to advance RW/avoid obstacles   Modified Rankin (Stroke Patients Only) Modified Rankin (Stroke Patients Only) Pre-Morbid Rankin Score: No symptoms Modified Rankin: Moderately severe disability     Balance Overall balance assessment: Needs assistance Sitting-balance support: Bilateral upper extremity supported, Feet supported Sitting balance-Leahy Scale: Fair Sitting balance - Comments: post lean, cannot reach outside BOS without LOB   Standing balance support: Bilateral upper extremity supported, During functional  activity Standing balance-Leahy Scale: Poor Standing balance comment: reliant on external support, post  and R lateral lean in standing, R knee buckling                            Cognition Arousal/Alertness: Lethargic Behavior During Therapy: Flat affect Overall Cognitive Status: Impaired/Different from baseline Area of Impairment: Attention, Following commands, Safety/judgement, Awareness, Problem solving                   Current Attention Level: Sustained   Following Commands: Follows one step commands with increased time Safety/Judgement: Decreased awareness of safety Awareness: Intellectual Problem Solving: Slow processing, Difficulty sequencing, Decreased initiation, Requires verbal cues General Comments: increased time for all communication/task initiation, decreased insight into fatigue. pt reporting he enjoys 90s country music so music left playing on Youtube at end of session for him while pt sitting up in recliner to eat lunch to encourage pt attention and alertness; per RN pt requested to sleep most of the day prior.        Exercises Other Exercises Other Exercises: seated BLE AROM: LAQ, hip flexion x10 reps ea Other Exercises: STS x 3 reps with breaks between Other Exercises: static standing x5 minutes with RW support    General Comments General comments (skin integrity, edema, etc.): VSS on RA      Pertinent Vitals/Pain Pain Assessment Pain Assessment: No/denies pain           PT Goals (current goals can now be found in the care plan section) Acute Rehab PT Goals Patient Stated Goal: not stated PT Goal Formulation: With patient Time For Goal Achievement: 09/27/21 Progress towards PT goals: Progressing toward goals    Frequency    Min 4X/week      PT Plan Current plan remains appropriate       AM-PAC PT "6 Clicks" Mobility   Outcome Measure       Co-Treatment Help needed turning from your back to your side while in a  flat bed without using bedrails?: A Little Help needed moving from lying on your back to sitting on the side of a flat bed without using bedrails?: A Lot Help needed moving to and from a bed to a chair (including a wheelchair)?: A Lot Help needed standing up from a chair using your arms (e.g., wheelchair or bedside chair)?: A Lot Help needed to walk in hospital room?: A Lot Help needed climbing 3-5 steps with a railing? : Total 6 Click Score: 12  PT/OT Co-Treatment: Yes Reason for Co-Treatment: For patient/therapist safety;To address functional/ADL transfers;Complexity of the patient's impairments (multi-system involvement);Necessary to address cognition/behavior during functional activity; PT goals addressed during session: Mobility/safety with mobility;Balance;Proper use of DME;Strengthening/ROM;   End of Session Equipment Utilized During Treatment: Gait belt Activity Tolerance: Patient tolerated treatment well Patient left: in chair;with call bell/phone within reach;with chair alarm set Nurse Communication: Mobility status;Other (comment) (pt may need Supervision while eating lunch due to poor attention span) PT Visit Diagnosis: Other abnormalities of gait and mobility (R26.89);Other symptoms and signs involving the nervous system (R29.898)     Time: 2440-1027 PT Time Calculation (min) (ACUTE ONLY): 38 min  Charges:  $Gait Training: 8-22 mins                     Eiman Maret P., PTA Acute Rehabilitation Services Secure Chat Preferred 9a-5:30pm Office: [phone]    [place] 09/15/2021, 5:11 PM

## 2021-09-15 NOTE — Progress Notes (Signed)
PROGRESS NOTE                                                                                                                                                                                                             Patient Demographics:    Martin Mullen, is a 41 y.o. male, DOB - 01/20/1981, EAV:[id number]  Outpatient Primary MD for the patient is [name], NP    LOS - 3  Admit date - 09/11/2021    Chief Complaint  Patient presents with   Code Stroke       Brief Narrative (HPI from H&P) -  41 y.o. 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    MRI brain was revealing for 6 mm acute ischemic nonhemorrhagic left ventral pons/midbrain infarct.  CTA head and neck with no large vessel occlusion.   Subjective:   Patient in bed, appears comfortable, denies any headache, no fever, no chest pain or pressure, no shortness of breath , no abdominal pain. No new focal weakness.   Assessment  & Plan :    Stroke   - 6 mm acute ischemic non-hemorrhagic left ventral pons/midbrain Infarct.  He has history of previous stroke, he also has history of prothrombin gene mutation, history of noncompliance with medications.  He does seem to have an acute stroke now, confirms that he was not taking any medications for 3 weeks prior to this hospital admission, counseled on compliance, currently on combination of aspirin and Eliquis, Crestor and insulin regimen also resumed.  A1c is greater than 14 LDL above goal. Echo stable full stroke work-up done.  Await placement to SNF/CIR.  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  They advise keeping on Eliquis  indefinitely.  HTN (hypertension)  Allowing for permissive hypertension  STEMI s/p RCA stent 08/2012 - Asymptomatic from a cardiac standpoint.  Continue Eliquis and aspirin combination along with statin for secondary prevention, ARB held to allow for permissive hypertension..  Dehydration with hypokalemia.  Hydrated with IV fluids.  Potassium replaced.  Type 2 diabetes mellitus with hyperlipidemia - poor control in the outpatient setting as evident by the  extremely high A1c, he was not taking any medications for 3 weeks, counseled on compliance, placed on long-acting insulin and sliding scale.  Monitor and adjust  CBG (last 3)  Recent Labs    09/14/21 1204 09/14/21 1540 09/14/21 2038  GLUCAP 162* 169* 156*   Lab Results  Component Value Date   HGBA1C >14 08/05/2021         Condition - Extremely Guarded  Family Communication  :  friend Martin Mullen - [phone]  - on 09/12/21  Code Status :  Full  Consults  :  Neuro  PUD Prophylaxis : Eliquis   Procedures  :     MRI - 1. 6 mm acute ischemic nonhemorrhagic left ventral pons/midbrain infarct. 2. Underlying chronic microvascular ischemic disease with multiple remote lacunar infarcts involving the right corona radiata, basal ganglia, thalami, and pons.  CTA - 1. No acute intracranial abnormality or significant interval change. 2. Stable atrophy and diffuse white matter disease. 3. Remote lacunar infarcts involving the internal capsule bilaterally and left thalamus. 4. Aspects 10/10   TTE - 1. Left ventricular ejection fraction, by estimation, is 60 to 65%. The left ventricle has normal function. The left ventricle has no regional wall motion abnormalities. Left ventricular diastolic parameters were normal.  2. Right ventricular systolic function is normal. The right ventricular size is normal.  3. The mitral valve is normal in structure. No evidence of mitral valve regurgitation.  4. The aortic valve was not well visualized. Aortic  valve regurgitation is not visualized.  5. IVC not well visualized.       Disposition Plan  :    Status is: Observation  DVT Prophylaxis  :     apixaban (ELIQUIS) tablet 5 mg   Lab Results  Component Value Date   PLT 249 09/15/2021    Diet :  Diet Order             DIET - DYS 1 Room service appropriate? No; Fluid consistency: Nectar Thick  Diet effective now                    Inpatient Medications  Scheduled Meds:   stroke: early stages of recovery book   Does not apply Once   apixaban  5 mg Oral BID   aspirin EC  81 mg Oral Daily   buPROPion  150 mg Oral Daily   insulin aspart  0-20 Units Subcutaneous TID PC & HS   insulin glargine-yfgn  12 Units Subcutaneous Daily   nicotine  21 mg Transdermal Daily   potassium chloride  40 mEq Oral Once   rosuvastatin  20 mg Oral Daily   sodium chloride flush  3 mL Intravenous Once   Continuous Infusions:   PRN Meds:.acetaminophen **OR** acetaminophen (TYLENOL) oral liquid 160 mg/5 mL **OR** acetaminophen, food thickener  Antibiotics  :    Anti-infectives (From admission, onward)    None        Time Spent in minutes  30   Susa Raring M.D on 09/15/2021 at 8:32 AM  To page go to [email or web address]   [facility] -  Office  [phone]  See all Orders from today for further details    Objective:   Vitals:   09/14/21 1540 09/14/21 1944 09/14/21 2303 09/15/21 0319  BP:  (!) 148/95 (!) 129/107 (!) 130/91  Pulse:   77 81  Resp:   18 16  Temp: 98 F (36.7 C) 97.8 F (36.6 C) 97.9 F (36.6 C) 98.3  F (36.8 C)  TempSrc: Oral Oral Oral Oral  SpO2:  94% 95% 95%  Weight:    87.2 kg  Height:        Wt Readings from Last 3 Encounters:  09/15/21 87.2 kg  08/05/21 88.5 kg  12/23/20 107 kg     Intake/Output Summary (Last 24 hours) at 09/15/2021 0832 Last data filed at 09/15/2021 0322 Gross per 24 hour  Intake --  Output 1170 ml  Net -1170 ml      Physical Exam  Awake Alert, he has acute on  chronic right-sided weakness are much weaker as compared to his right leg, strength in the right lower extremity 4/5 upper extremity 3.5/5,  [place].AT,PERRAL Supple Neck, No JVD,   Symmetrical Chest wall movement, Good air movement bilaterally, CTAB RRR,No Gallops, Rubs or new Murmurs,  +ve B.Sounds, Abd Soft, No tenderness,   No Cyanosis, Clubbing or edema      Data Review:    CBC Recent Labs  Lab 09/11/21 1719 09/11/21 1729 09/14/21 0204 09/15/21 0437  WBC 8.1  --  10.4 12.3*  HGB 14.9 14.3 15.2 15.3  HCT 42.6 42.0 42.3 44.1  PLT 213  --  224 249  MCV 90.4  --  88.9 90.7  MCH 31.6  --  31.9 31.5  MCHC 35.0  --  35.9 34.7  RDW 12.5  --  12.4 12.7  LYMPHSABS 3.0  --  3.8 3.9  MONOABS 0.5  --  0.6 0.7  EOSABS 0.7*  --  0.5 0.4  BASOSABS 0.1  --  0.1 0.1    Electrolytes Recent Labs  Lab 09/11/21 1719 09/11/21 1729 09/14/21 0204 09/15/21 0437  NA 137 138 141 141  K 3.6 3.6 3.3* 3.4*  CL 106 103 109 110  CO2 23  --  25 23  GLUCOSE 266* 266* 97 92  BUN 5* 4* 8 6  CREATININE 0.76 0.60* 0.72 0.74  CALCIUM 8.5*  --  9.1 8.8*  AST 9*  --   --   --   ALT 6  --   --   --   ALKPHOS 101  --   --   --   BILITOT 0.3  --   --   --   ALBUMIN 2.8*  --   --   --   MG  --   --  1.6* 1.8  INR 1.0  --   --   --     ------------------------------------------------------------------------------------------------------------------ No results for input(s): "CHOL", "HDL", "LDLCALC", "TRIG", "CHOLHDL", "LDLDIRECT" in the last 72 hours.   Lab Results  Component Value Date   HGBA1C >14 08/05/2021      Radiology Reports DG Swallowing Func-Speech Pathology  Result Date: 09/12/2021 Table formatting from the original result was not included. Objective Swallowing Evaluation: Type of Study: MBS-Modified Barium Swallow Study  Patient Details Name: Martin Mullen MRN: [id number] Date of Birth: 1980/11/25 Today's Date: 09/12/2021 Time: SLP Start Time (ACUTE ONLY): 1445 -SLP Stop Time (ACUTE  ONLY): 1505 SLP Time Calculation (min) (ACUTE ONLY): 20 min Past Medical History: Past Medical History: Diagnosis Date  Acute transmural inferior wall MI (HCC) 08/28/2012  .5 x 12 mm Veri-flex stent non-DES.  Chicken pox   Coronary artery disease   Inferior ST elevation myocardial infarction in July of 2014. Cardiac catheterization showed 95% distal RCA stenosis and 90% first diagonal stenosis with normal ejection fraction. He underwent PCI in bare-metal stent placement to the distal RCA.  Diabetes mellitus without complication (  HCC)   Hypercholesterolemia   Stroke (HCC)   Tobacco use  Past Surgical History: Past Surgical History: Procedure Laterality Date  CORONARY ANGIOPLASTY WITH STENT PLACEMENT    DEBRIDEMENT AND CLOSURE WOUND N/A 09/03/2019  Procedure: Debridement and closure of scrotal wound;  Surgeon: Allena Napoleon, MD;  Location: [facility];  Service: Plastics;  Laterality: N/A;  DEBRIDEMENT AND CLOSURE WOUND N/A 07/25/2019  Procedure: DEBRIDEMENT AND CLOSURE WOUND;  Surgeon: Allena Napoleon, MD;  Location: [facility];  Service: Plastics;  Laterality: N/A;  INCISION AND DRAINAGE ABSCESS N/A 07/20/2019  Procedure: INCISION AND DRAINAGE ABSCESS;  Surgeon: Crista Elliot, MD;  Location: [facility];  Service: Urology;  Laterality: N/A;  LEFT HEART CATHETERIZATION WITH CORONARY ANGIOGRAM N/A 08/28/2012  Procedure: LEFT HEART CATHETERIZATION WITH CORONARY ANGIOGRAM;  Surgeon: Pamella Pert, MD;  Location: [facility] CATH LAB;  Service: Cardiovascular;  Laterality: N/A;  TEE WITHOUT CARDIOVERSION N/A 09/23/2020  Procedure: TRANSESOPHAGEAL ECHOCARDIOGRAM (TEE);  Surgeon: Lamar Blinks, MD;  Location: [facility] ORS;  Service: Cardiovascular;  Laterality: N/A;  WRIST SURGERY  1988 HPI: Patient is a 41 y.o. male with PMH: recurrent strokes, HTN, STEMI s/p RCA stent 2014, DM-2 insulin dependent, HLD, cocaine use. He presented to the hospital on 09/11/21 with slurred speech. MRI brain revealed 28mm acute ischemic  nonhemmorhagic left ventral pons/midbrain infarct; CTA head and neck with no large vessel occlusion.  Subjective: awake, alert, cooperative  Recommendations for follow up therapy are one component of a multi-disciplinary discharge planning process, led by the attending physician.  Recommendations may be updated based on patient status, additional functional criteria and insurance authorization. Assessment / Plan / Recommendation   09/12/2021   4:00 PM Clinical Impressions Clinical Impression Patient presents with mild-mod oral phase and mild pharyngeal phase dysphagia as per this MBS.  During oral phase, patient exhibited brief holding of liquid boluses as he was observed to impulsively try to pour all of barium from cup into mouth before initiating a swallow. He also exhibited delays in anterior to posterior transit of puree solids and mechanical soft solids. With thin liquids, patient exhibited swallow intiation delays mainly at level of pyriform sinus but at times swallow was initiated at level of vallecular sinus. He exhibited fairly consistent flash penetration of thin liquids (PAS 2)  but with full clearance from laryngeal vestibule each time and no significant pharyngeal residuals post initial swallow. With nectar thick liquids and puree solids he exhibited swallow initiation delay at level of vallecular sinus but only one instance of flash penetration with nectar thick liquids. He did exhibit one instance of sensed, trace aspiration which occured during the swallow when patient took sip of thin liquid barium while still having some masticated mechanical soft solid in his mouth. His cough was strong and appeared to result in at least some of aspirate being ejected out  after passing through vocal cords. When performing esophageal sweep with barium tablet, the tablet as well as the puree solids that were taken along with it were observed in approximately upper thoracic portion of esophagus and an additional  bite of puree helped to fully transit tablet and puree throuth esophagus. SLP is recommending initiate Dys 1 (puree) solids and nectar thick liquids via cup sips (no straws) at this time. SLP will follow patient for diet toleration. SLP Visit Diagnosis Dysphagia, oropharyngeal phase (R13.12) Impact on safety and function Mild aspiration risk     09/12/2021   4:00 PM Treatment Recommendations Treatment Recommendations Therapy  as outlined in treatment plan below     09/12/2021   4:00 PM Prognosis Prognosis for Safe Diet Advancement Good   09/12/2021   4:00 PM Diet Recommendations SLP Diet Recommendations Dysphagia 1 (Puree) solids;Nectar thick liquid Liquid Administration via No straw;Cup Medication Administration Whole meds with puree Compensations Slow rate;Small sips/bites Postural Changes Seated upright at 90 degrees     09/12/2021   4:00 PM Other Recommendations Oral Care Recommendations Oral care BID;Staff/trained caregiver to provide oral care Other Recommendations Order thickener from pharmacy;Prohibited food (jello, ice cream, thin soups);Have oral suction available;Clarify dietary restrictions Follow Up Recommendations Acute inpatient rehab (3hours/day) Assistance recommended at discharge Frequent or constant Supervision/Assistance Functional Status Assessment Patient has had a recent decline in their functional status and demonstrates the ability to make significant improvements in function in a reasonable and predictable amount of time.   09/12/2021   4:00 PM Frequency and Duration  Speech Therapy Frequency (ACUTE ONLY) min 2x/week Treatment Duration 2 weeks     09/12/2021   4:00 PM Oral Phase Oral Phase Impaired Oral - Nectar Cup Reduced posterior propulsion Oral - Thin Cup Reduced posterior propulsion;Holding of bolus Oral - Thin Straw Reduced posterior propulsion Oral - Puree Weak lingual manipulation;Reduced posterior propulsion Oral - Mech Soft Weak lingual manipulation;Impaired mastication;Reduced  posterior propulsion Oral - Pill Reduced posterior propulsion    09/12/2021   4:00 PM Pharyngeal Phase Pharyngeal Phase Impaired Pharyngeal- Nectar Cup Delayed swallow initiation-vallecula;Delayed swallow initiation-pyriform sinuses;Reduced airway/laryngeal closure;Penetration/Aspiration during swallow;Pharyngeal residue - valleculae Pharyngeal Material enters airway, remains ABOVE vocal cords then ejected out Pharyngeal- Thin Cup Delayed swallow initiation-pyriform sinuses;Delayed swallow initiation-vallecula;Reduced airway/laryngeal closure;Penetration/Aspiration during swallow;Trace aspiration Pharyngeal Material enters airway, passes BELOW cords and not ejected out despite cough attempt by patient;Material enters airway, passes BELOW cords then ejected out Pharyngeal- Thin Straw Delayed swallow initiation-pyriform sinuses;Delayed swallow initiation-vallecula;Reduced airway/laryngeal closure;Penetration/Aspiration during swallow Pharyngeal Material enters airway, remains ABOVE vocal cords then ejected out Pharyngeal- Puree Delayed swallow initiation-vallecula Pharyngeal- Mechanical Soft Delayed swallow initiation-vallecula Pharyngeal- Pill [facility]    09/12/2021   4:00 PM Cervical Esophageal Phase  Cervical Esophageal Phase [facility] Angela Nevin, MA, CCC-SLP Speech Therapy                     ECHOCARDIOGRAM COMPLETE  Result Date: 09/12/2021    ECHOCARDIOGRAM REPORT   Patient Name:   Martin Mullen Date of Exam: 09/12/2021 Medical Rec #:  [id number]       Height:       70.0 in Accession #:    [id number]      Weight:       193.6 lb Date of Birth:  [birth year]       BSA:          2.059 m Patient Age:    41 years        BP:           109/84 mmHg Patient Gender: M               HR:           72 bpm. Exam Location:  Inpatient Procedure: 2D Echo Indications:    stroke  History:        Patient has prior history of Echocardiogram examinations, most                 recent 09/23/2020. CAD; Risk Factors:Hypertension, Dyslipidemia  and Diabetes.  Sonographer:    Delcie Roch RDCS Referring Phys: [id number] CHING T TU  Sonographer Comments: Suboptimal subcostal window and Technically difficult study due to poor echo windows. Image acquisition challenging due to respiratory motion. IMPRESSIONS  1. Left ventricular ejection fraction, by estimation, is 60 to 65%. The left ventricle has normal function. The left ventricle has no regional wall motion abnormalities. Left ventricular diastolic parameters were normal.  2. Right ventricular systolic function is normal. The right ventricular size is normal.  3. The mitral valve is normal in structure. No evidence of mitral valve regurgitation.  4. The aortic valve was not well visualized. Aortic valve regurgitation is not visualized.  5. IVC not well visualized. Conclusion(s)/Recommendation(s): No change from prior TEE. FINDINGS  Left Ventricle: Left ventricular ejection fraction, by estimation, is 60 to 65%. The left ventricle has normal function. The left ventricle has no regional wall motion abnormalities. The left ventricular internal cavity size was normal in size. There is  no left ventricular hypertrophy. Left ventricular diastolic parameters were normal. Right Ventricle: The right ventricular size is normal. Right ventricular systolic function is normal. Left Atrium: Left atrial size was normal in size. Right Atrium: Right atrial size was normal in size. Pericardium: There is no evidence of pericardial effusion. Mitral Valve: The mitral valve is normal in structure. No evidence of mitral valve regurgitation. Tricuspid Valve: Tricuspid valve regurgitation is not demonstrated. Aortic Valve: The aortic valve was not well visualized. Aortic valve regurgitation is not visualized. Pulmonic Valve: The pulmonic valve was not well visualized. Pulmonic valve regurgitation is not visualized. Aorta: The aortic root and ascending aorta are structurally normal, with no evidence of dilitation.  Venous: IVC not well visualized. IAS/Shunts: The interatrial septum was not well visualized.  LEFT VENTRICLE PLAX 2D LVIDd:         4.10 cm   Diastology LVIDs:         2.60 cm   LV e' medial:    7.51 cm/s LV PW:         1.00 cm   LV E/e' medial:  8.5 LV IVS:        0.90 cm   LV e' lateral:   14.00 cm/s LVOT diam:     1.90 cm   LV E/e' lateral: 4.6 LV SV:         58 LV SV Index:   28 LVOT Area:     2.84 cm  RIGHT VENTRICLE RV S prime:     10.10 cm/s TAPSE (M-mode): 1.5 cm LEFT ATRIUM           Index        RIGHT ATRIUM           Index LA diam:      3.50 cm 1.70 cm/m   RA Area:     12.60 cm LA Vol (A4C): 30.4 ml 14.77 ml/m  RA Volume:   28.00 ml  13.60 ml/m  AORTIC VALVE LVOT Vmax:   99.20 cm/s LVOT Vmean:  67.600 cm/s LVOT VTI:    0.206 m  AORTA Ao Root diam: 3.30 cm MITRAL VALVE MV Area (PHT): 3.85 cm    SHUNTS MV Decel Time: 197 msec    Systemic VTI:  0.21 m MV E velocity: 64.20 cm/s  Systemic Diam: 1.90 cm MV A velocity: 57.70 cm/s MV E/A ratio:  1.11 Photographer signed by Carolan Clines Signature Date/Time: 09/12/2021/11:48:29 AM    Final    MR BRAIN WO  CONTRAST  Result Date: 09/11/2021 CLINICAL DATA:  Initial evaluation for neuro deficit, stroke suspected. EXAM: MRI HEAD WITHOUT CONTRAST TECHNIQUE: Multiplanar, multiecho pulse sequences of the brain and surrounding structures were obtained without intravenous contrast. COMPARISON:  Prior CTs from earlier the same day. FINDINGS: Brain: Cerebral volume within normal limits. Scattered T2/FLAIR hyperintensity involving the supratentorial cerebral white matter, consistent with chronic small vessel ischemic disease. Few scatter remote lacunar infarcts present about the right corona radiata, basal ganglia, thalami, and pons. 6 mm focus of restricted diffusion at the left ventral pons/midbrain consistent with an acute ischemic infarct. No associated hemorrhage or mass effect. No other evidence for acute or subacute ischemia. Gray-white matter  differentiation otherwise maintained. No acute intracranial hemorrhage. No mass lesion, midline shift or mass effect. No hydrocephalus or extra-axial fluid collection. Pituitary gland and suprasellar region within normal limits. Vascular: Major intracranial vascular flow voids are maintained. Skull and upper cervical spine: Craniocervical junction normal. Bone marrow signal intensity within normal limits. No scalp soft tissue abnormality. Sinuses/Orbits: Globes orbital soft tissues within normal limits. Scattered mucosal thickening noted about the ethmoidal air cells and maxillary sinuses. No mastoid effusion. Other: None. IMPRESSION: 1. 6 mm acute ischemic nonhemorrhagic left ventral pons/midbrain infarct. 2. Underlying chronic microvascular ischemic disease with multiple remote lacunar infarcts involving the right corona radiata, basal ganglia, thalami, and pons. Electronically Signed   By: Rise Mu M.D.   On: 09/11/2021 19:27   CT ANGIO HEAD NECK W WO CM W PERF (CODE STROKE)  Result Date: 09/11/2021 CLINICAL DATA:  Code stroke. Slurred speech. Last known well 3 a.m. EXAM: CT ANGIOGRAPHY HEAD AND NECK CT PERFUSION BRAIN TECHNIQUE: Multidetector CT imaging of the head and neck was performed using the standard protocol during bolus administration of intravenous contrast. Multiplanar CT image reconstructions and MIPs were obtained to evaluate the vascular anatomy. Carotid stenosis measurements (when applicable) are obtained utilizing NASCET criteria, using the distal internal carotid diameter as the denominator. Multiphase CT imaging of the brain was performed following IV bolus contrast injection. Subsequent parametric perfusion maps were calculated using RAPID software. RADIATION DOSE REDUCTION: This exam was performed according to the departmental dose-optimization program which includes automated exposure control, adjustment of the mA and/or kV according to patient size and/or use of iterative  reconstruction technique. CONTRAST:  OMNIPAQUE IOHEXOL 350 MG/ML SOLN COMPARISON:  CT head without contrast 09/11/2021. MR head without contrast 08/26/2020. MRA head without contrast 08/27/2020 FINDINGS: CTA NECK FINDINGS Aortic arch: 3 vessel arch configuration is present. No significant atherosclerotic changes are present at the arch. No aneurysm or stenosis. Right carotid system: The right common carotid artery is within normal limits. Bifurcation is unremarkable. Cervical right ICA is normal. Left carotid system: The left common carotid artery is within normal limits. The bifurcation is unremarkable. Cervical left ICA is normal. Vertebral arteries: The left vertebral artery is dominant. Both vertebral arteries originate from the subclavian arteries without significant stenosis. No significant stenosis is present in either vertebral artery in the neck. Skeleton: Vertebral body heights and alignment are normal. No focal osseous lesions present. The patient is edentulous. Other neck: Soft tissues the neck are otherwise unremarkable. Salivary glands are within normal limits. Thyroid is normal. No significant adenopathy is present. No focal mucosal or submucosal lesions are present. Upper chest: Lung apices are clear. Thoracic inlet is within normal limits. Review of the MIP images confirms the above findings CTA HEAD FINDINGS Anterior circulation: The carotid arteries are within normal limits the skull base  through the ICA termini. The A1 and M1 segments are normal. The anterior communicating artery is patent. MCA bifurcations are within normal limits bilaterally. The ACA and MCA branch vessels are normal. Posterior circulation: The left vertebral artery is the dominant vessel. The right vertebral artery terminates at the PICA. Left PICA origin is visualized and normal. The left vertebral artery becomes the basilar artery. Basilar artery is normal. Both posterior cerebral arteries originate from basilar tip.  The PCA branch vessels are within normal limits bilaterally. Venous sinuses: The dural sinuses are patent. The straight sinus deep cerebral veins are intact. Cortical veins are within normal limits. No significant vascular malformation is evident. Anatomic variants: None Review of the MIP images confirms the above findings CT Brain Perfusion Findings: The field of view is lower than typical protocol. Software could not calculate perfusion mass. IMPRESSION: 1. No emergent large vessel occlusion. 2. No significant stenosis in the neck or circle-of-Willis. 3. CT perfusion maps could not be calculated due to technical factors related to the field of view. The patient could be re-scanned for perfusion if clinically indicated. Electronically Signed   By: Marin Roberts M.D.   On: 09/11/2021 18:27   CT HEAD CODE STROKE WO CONTRAST  Result Date: 09/11/2021 CLINICAL DATA:  Code stroke. Slurred speech. Last known well 3 a.m. EXAM: CT HEAD WITHOUT CONTRAST TECHNIQUE: Contiguous axial images were obtained from the base of the skull through the vertex without intravenous contrast. RADIATION DOSE REDUCTION: This exam was performed according to the departmental dose-optimization program which includes automated exposure control, adjustment of the mA and/or kV according to patient size and/or use of iterative reconstruction technique. COMPARISON:  CT head without contrast 11/16/2020. MR head without contrast 08/26/2020 FINDINGS: Brain: Remote internal capsule lacunar infarcts noted bilaterally. Remote left thalamic lacunar infarct noted. Mild atrophy and diffuse white matter disease is otherwise stable. Basal ganglia are otherwise intact. Insular ribbon is normal. Acute or focal cortical abnormality is present. The ventricles are of normal size. No significant extraaxial fluid collection is present. The brainstem and cerebellum are within normal limits. Vascular: Atherosclerotic calcifications are present within the  cavernous internal carotid arteries bilaterally. No hyperdense vessel is present. Skull: Calvarium is intact. No focal lytic or blastic lesions are present. No significant extracranial soft tissue lesion is present. Sinuses/Orbits: The paranasal sinuses and mastoid air cells are clear. The globes and orbits are within normal limits. ASPECTS [facility] Stroke Program Early CT Score) - Ganglionic level infarction (caudate, lentiform nuclei, internal capsule, insula, M1-M3 cortex): 7/7 - Supraganglionic infarction (M4-M6 cortex): 3/3 Total score (0-10 with 10 being normal): 10/10 IMPRESSION: 1. No acute intracranial abnormality or significant interval change. 2. Stable atrophy and diffuse white matter disease. 3. Remote lacunar infarcts involving the internal capsule bilaterally and left thalamus. 4. Aspects 10/10 * The above was relayed via text pager to Dr. Amada Jupiter on 09/11/2021 at 17:30 . Electronically Signed   By: Marin Roberts M.D.   On: 09/11/2021 17:31

## 2021-09-16 LAB — GLUCOSE, CAPILLARY
Glucose-Capillary: 118 mg/dL — ABNORMAL HIGH (ref 70–99)
Glucose-Capillary: 115 mg/dL — ABNORMAL HIGH (ref 70–99)
Glucose-Capillary: 135 mg/dL — ABNORMAL HIGH (ref 70–99)
Glucose-Capillary: 176 mg/dL — ABNORMAL HIGH (ref 70–99)

## 2021-09-16 LAB — BASIC METABOLIC PANEL
Anion gap: 7 (ref 5–15)
BUN: 8 mg/dL (ref 6–20)
CO2: 23 mmol/L (ref 22–32)
Calcium: 8.9 mg/dL (ref 8.9–10.3)
Chloride: 109 mmol/L (ref 98–111)
Creatinine, Ser: 0.69 mg/dL (ref 0.61–1.24)
GFR, Estimated: >60 mL/min (ref >60)
Glucose, Bld: 114 mg/dL — ABNORMAL HIGH (ref 70–99)
Potassium: 3.6 mmol/L (ref 3.5–5.1)
Sodium: 139 mmol/L (ref 135–145)

## 2021-09-16 MED ORDER — ORAL CARE MOUTH RINSE: 15.0000 mL | OROMUCOSAL | Status: DC | PRN

## 2021-09-16 MED ORDER — ORAL CARE MOUTH RINSE
15.0000 mL | OROMUCOSAL | Status: DC
  Administered 2021-09-16 – 2021-09-27 (×44): 15 mL via OROMUCOSAL

## 2021-09-16 NOTE — Progress Notes (Signed)
Pt requesting to change POA to his girlfriend.  SW, CM, and primary MD notified; chaplain consulted.

## 2021-09-16 NOTE — Progress Notes (Signed)
Occupational Therapy Treatment Patient Details Name: Martin Mullen MRN: [id number] DOB: Oct 23, 1980 Today's Date: 09/16/2021   History of present illness Pt is a 41 y/o M presenting to ED on 7/29 with slurred speech, MRI revealing 85mm acute inschemic non-hemorrhagic L ventral pons/midbrain infarct. CTA head and neck with no large vessel occlusion. PMH significant for STEMI s/p RCA stent 2014, HTN, recurrent strokes, insulin-dependent DM2, hyperlipidemia, substance use disorder.   OT comments  Treatment focused on self care tasks. Patient performed partial bathing task at edge of bed washing most of his upper body, his groin and his lower legs. Patient min guard to stand with walker and min assist to ambulate to bathroom. Patient positioned over toilet to urinate - initially urinating on floor but then able to aim effectively and complete task without further mess. Patient flushed toilet. Patient slowly becoming less responsive and turning out of bathroom and making way back to bed with therapist needing to steady him more and keep him upright. Once by bed patient only able to get partially turned before he went into an uncontrolled descent onto the bed with his walker with most of his legs off of the bed.Therapist had assistance of tech to get patient safely and properly back in bed. Patient reports he didn't feel dizzy just weak. Unable to get BP to determine if he had become orthostatic. Patient left with bed alarm on. Cont POC.    Recommendations for follow up therapy are one component of a multi-disciplinary discharge planning process, led by the attending physician.  Recommendations may be updated based on patient status, additional functional criteria and insurance authorization.    Follow Up Recommendations  Acute inpatient rehab (3hours/day)    Assistance Recommended at Discharge Frequent or constant Supervision/Assistance  Patient can return home with the following  A lot of help with  walking and/or transfers;A lot of help with bathing/dressing/bathroom;Assistance with cooking/housework;Direct supervision/assist for medications management;Direct supervision/assist for financial management;Assist for transportation;Help with stairs or ramp for entrance;Assistance with feeding   Equipment Recommendations       Recommendations for Other Services      Precautions / Restrictions Precautions Precautions: Fall Precaution Comments: tendency to R knee buckle Restrictions Weight Bearing Restrictions: No       Mobility Bed Mobility                    Transfers                         Balance Overall balance assessment: Needs assistance Sitting-balance support: No upper extremity supported, Feet supported Sitting balance-Leahy Scale: Good Sitting balance - Comments: leaned over and washed legs   Standing balance support: Bilateral upper extremity supported, During functional activity Standing balance-Leahy Scale: Poor Standing balance comment: reliant on external support.                           ADL either performed or assessed with clinical judgement   ADL Overall ADL's : Needs assistance/impaired Eating/Feeding: Supervision/ safety Eating/Feeding Details (indicate cue type and reason): able to open cookie container and eat cookie     Upper Body Bathing: Set up;Minimal assistance;Sitting Upper Body Bathing Details (indicate cue type and reason): sat at edge of bed and performed partial bath. patient able to wash underneath arms, arms and upper chest Lower Body Bathing: Moderate assistance Lower Body Bathing Details (indicate cue type and reason): Patient able to  wash groin and legs Upper Body Dressing : Moderate assistance Upper Body Dressing Details (indicate cue type and reason): patient able to put arms in both sleeves     Toilet Transfer: Minimal assistance;Rolling walker (2 wheels);Ambulation;Regular Teacher, adult education  Details (indicate cue type and reason): Able to ambulate to bathroom and stand over toilet. Min assist for steadying and verbal cues for directions. Toileting- Clothing Manipulation and Hygiene: Moderate assistance Toileting - Clothing Manipulation Details (indicate cue type and reason): Patient able to urinate over toilet - initially messed bowl but then able to urinate effectively in toilet.     Functional mobility during ADLs: Minimal assistance;Rolling walker (2 wheels) General ADL Comments: With standing over toilet patient started to slowly become less responsive. He was able to complete urinating, flush toilet.    Extremity/Trunk Assessment              Vision       Perception     Praxis      Cognition Arousal/Alertness: Awake/alert Behavior During Therapy: Flat affect Overall Cognitive Status: Impaired/Different from baseline                           Safety/Judgement: Decreased awareness of safety   Problem Solving: Slow processing, Difficulty sequencing, Decreased initiation, Requires verbal cues General Comments: Alert to self, place and situation. Able to follow all commands. Slower processing and slow verbal responses.        Exercises      Shoulder Instructions       General Comments      Pertinent Vitals/ Pain       Pain Assessment Pain Assessment: No/denies pain  Home Living                                          Prior Functioning/Environment              Frequency  Min 2X/week        Progress Toward Goals  OT Goals(current goals can now be found in the care plan section)  Progress towards OT goals: Progressing toward goals  Acute Rehab OT Goals Patient Stated Goal: to not fall OT Goal Formulation: With patient Time For Goal Achievement: 09/26/21 Potential to Achieve Goals: Good  Plan Discharge plan remains appropriate;Frequency remains appropriate    Co-evaluation                  AM-PAC OT "6 Clicks" Daily Activity     Outcome Measure   Help from another person eating meals?: A Little Help from another person taking care of personal grooming?: A Little Help from another person toileting, which includes using toliet, bedpan, or urinal?: A Lot Help from another person bathing (including washing, rinsing, drying)?: A Lot Help from another person to put on and taking off regular upper body clothing?: A Lot Help from another person to put on and taking off regular lower body clothing?: A Lot 6 Click Score: 14    End of Session Equipment Utilized During Treatment: Gait belt;Rolling walker (2 wheels)  OT Visit Diagnosis: Unsteadiness on feet (R26.81);Other abnormalities of gait and mobility (R26.89);Muscle weakness (generalized) (M62.81);Other symptoms and signs involving cognitive function;Cognitive communication deficit (R41.841) Symptoms and signs involving cognitive functions: Cerebral infarction   Activity Tolerance Patient tolerated treatment well   Patient Left in bed;with call bell/phone  within reach;with bed alarm set   Nurse Communication Mobility status        Time: 1510-1543 OT Time Calculation (min): 33 min  Charges: OT General Charges $OT Visit: 1 Visit OT Treatments $Self Care/Home Management : 23-37 mins  Siren Porrata, OTR/L Acute Care Rehab Services  Office [phone] Pager: [phone]   Kelli Churn 09/16/2021, 4:30 PM

## 2021-09-16 NOTE — Progress Notes (Signed)
Speech Language Pathology Treatment: Dysphagia;Cognitive-Linquistic  Patient Details Name: Martin Mullen MRN: [id number] DOB: [birth year] Today's Date: 09/16/2021 Time: 0263-7858 SLP Time Calculation (min) (ACUTE ONLY): 25 min  Assessment / Plan / Recommendation Clinical Impression  Patient seen by SLP for skilled treatment session focused on cognition and swallow function. Patient was alert cooperative. He reported he ate breakfast but he is not hungry for lunch meal. He was receptive towards advanced PO trials of ice chips. Mastication, oral transit and swallow initiation were all delayed and patient did exhibit a delayed throat clear and cough presumed to be penetration or aspiration of water. Even prior to ice chip trials, patient had a congested cough which was not productive despite patient attempts. SLP observed patient as he fed self ice chips using spoon and he was observed to be somewhat less impulsive.  Patient also seen for cognitive function goals. He told SLP that he has been working on his right hand and arm and "stacking cups" on table. SLP did observe improved timeliness of responses as well as actions. He asked SLP "are you the one who was talking about rehab?" He told SLP that he does not want to be a burden to friends/family and doesn't want to be reliant upon others. He made a comment that his ex-girlfriend told him he would need "24 hour surveillance." SLP explained that recommendation is for 24 hour supervision and assistance initially upon discharge from hospital. Patient's speech remains slow but his speech intelligibility and rate of speech are both improving. SLP is recommending to continue with SLP intervention for dysphagia, cognition and speech function.   HPI HPI: Patient is a 41 y.o. male with PMH: recurrent strokes, HTN, STEMI s/p RCA stent 2014, DM-2 insulin dependent, HLD, cocaine use. He presented to the hospital on 09/11/21 with slurred speech. MRI brain revealed 58mm  acute ischemic nonhemmorhagic left ventral pons/midbrain infarct; CTA head and neck with no large vessel occlusion.      SLP Plan  Continue with current plan of care      Recommendations for follow up therapy are one component of a multi-disciplinary discharge planning process, led by the attending physician.  Recommendations may be updated based on patient status, additional functional criteria and insurance authorization.    Recommendations  Diet recommendations: Dysphagia 1 (puree);Nectar-thick liquid Liquids provided via: Cup;No straw Medication Administration: Crushed with puree Supervision: Staff to assist with self feeding Compensations: Slow rate;Small sips/bites Postural Changes and/or Swallow Maneuvers: Seated upright 90 degrees;Upright 30-60 min after meal                Oral Care Recommendations: Oral care BID;Staff/trained caregiver to provide oral care Follow Up Recommendations: Follow physician's recommendations for discharge plan and follow up therapies Assistance recommended at discharge: Frequent or constant Supervision/Assistance SLP Visit Diagnosis: Dysphagia, oropharyngeal phase (R13.12) Plan: Continue with current plan of care          Angela Nevin, MA, CCC-SLP Speech Therapy

## 2021-09-16 NOTE — Progress Notes (Signed)
Physical Therapy Treatment Patient Details Name: Martin Mullen MRN: [id number] DOB: 03/23/1980 Today's Date: 09/16/2021   History of Present Illness Pt is a 41 y/o M presenting to ED on 7/29 with slurred speech, MRI revealing 35mm acute inschemic non-hemorrhagic L ventral pons/midbrain infarct. CTA head and neck with no large vessel occlusion. PMH significant for STEMI s/p RCA stent 2014, HTN, recurrent strokes, insulin-dependent DM2, hyperlipidemia, substance use disorder.    PT Comments    Pt received in recliner, agreeable to therapy session after recently working with OT and with good effort for seated/standing exercises. Pt able to perform reciprocal sit<>stand transfers x10 reps and multiple seated UE/LE exercises prior to c/o fatigue. Pt denies sx lightheadedness with standing however found to be orthostatic and with likely orthostatic episode working with OT as well so unsafe to attempt ambulation today. BP 132/97 seated and BP 102/90 standing at RW. Pt up in recliner to eat dinner at end of session, RN/SLP notified pt coughing and with possible sx aspiration when drinking nectar thick liquid despite instruction to take small bites/sips of food/drink when meal tray set up. Pt continues to benefit from PT services to progress toward functional mobility goals.   Recommendations for follow up therapy are one component of a multi-disciplinary discharge planning process, led by the attending physician.  Recommendations may be updated based on patient status, additional functional criteria and insurance authorization.  Follow Up Recommendations  Acute inpatient rehab (3hours/day)     Assistance Recommended at Discharge Frequent or constant Supervision/Assistance  Patient can return home with the following A lot of help with walking and/or transfers;A lot of help with bathing/dressing/bathroom;Assistance with cooking/housework;Direct supervision/assist for medications management;Direct  supervision/assist for financial management;Assist for transportation;Help with stairs or ramp for entrance   Equipment Recommendations  Other (comment) (TBD)    Recommendations for Other Services Rehab consult     Precautions / Restrictions Precautions Precautions: Fall Precaution Comments: tendency to R knee buckle, asymptomatic orthostatic hypotension Restrictions Weight Bearing Restrictions: No     Mobility  Bed Mobility Overal bed mobility: Needs Assistance             General bed mobility comments: pt received in chair    Transfers Overall transfer level: Needs assistance Equipment used: Rolling walker (2 wheels) Transfers: Sit to/from Stand Sit to Stand: Mod assist, Min assist           General transfer comment: x10 reciprocal reps from recliner<>RW, initially modA progressing to minA for subsequent reps    Modified Rankin (Stroke Patients Only) Modified Rankin (Stroke Patients Only) Pre-Morbid Rankin Score: No symptoms Modified Rankin: Moderately severe disability     Balance Overall balance assessment: Needs assistance Sitting-balance support: No upper extremity supported, Feet supported Sitting balance-Leahy Scale: Good     Standing balance support: Bilateral upper extremity supported, During functional activity Standing balance-Leahy Scale: Poor Standing balance comment: reliant on external support.                            Cognition Arousal/Alertness: Awake/alert Behavior During Therapy: Flat affect Overall Cognitive Status: Impaired/Different from baseline                           Safety/Judgement: Decreased awareness of safety   Problem Solving: Slow processing, Difficulty sequencing, Decreased initiation, Requires verbal cues General Comments: Alert to self, place and situation. Able to follow all commands. Slower processing and  slow verbal responses. Asymptomatic orthostatic hypotension during PT and OT  sessions today.        Exercises Other Exercises Other Exercises: seated BLE AROM: LAQ, hip flexion x10 reps ea Other Exercises: STS x 10 reciprocal reps Other Exercises: chair push-ups x10 reps Other Exercises: static standing x90 seconds x2 trials    General Comments General comments (skin integrity, edema, etc.): RN/SLP notified also pt with episode coughing trying to swallow nectar thick liquids; BP 132/97 (106) sitting in recliner, BP 102/90 standing at RW, BP 133/91 after return to sitting in recliner, RN notified      Pertinent Vitals/Pain Pain Assessment Pain Assessment: No/denies pain           PT Goals (current goals can now be found in the care plan section) Acute Rehab PT Goals Patient Stated Goal: to get home and play pool PT Goal Formulation: With patient Time For Goal Achievement: 09/27/21 Progress towards PT goals: Progressing toward goals    Frequency    Min 4X/week      PT Plan Current plan remains appropriate       AM-PAC PT "6 Clicks" Mobility   Outcome Measure  Help needed turning from your back to your side while in a flat bed without using bedrails?: A Little Help needed moving from lying on your back to sitting on the side of a flat bed without using bedrails?: A Lot Help needed moving to and from a bed to a chair (including a wheelchair)?: A Lot Help needed standing up from a chair using your arms (e.g., wheelchair or bedside chair)?: A Lot Help needed to walk in hospital room?: Total (too orthostatic to attempt) Help needed climbing 3-5 steps with a railing? : Total 6 Click Score: 11    End of Session Equipment Utilized During Treatment: Gait belt Activity Tolerance: Patient tolerated treatment well;Treatment limited secondary to medical complications (Comment) (asymptomatic orthostatic hypotension) Patient left: in chair;with call bell/phone within reach;with chair alarm set (set up for lunch) Nurse Communication: Mobility  status;Other (comment);Precautions (pt may need Supervision while eating dinner due to poor attention span; orthostatic hypotension) PT Visit Diagnosis: Other abnormalities of gait and mobility (R26.89);Other symptoms and signs involving the nervous system (R29.898)     Time: 1610-9604 PT Time Calculation (min) (ACUTE ONLY): 20 min  Charges:  $Therapeutic Exercise: 8-22 mins                     Allure Greaser P., PTA Acute Rehabilitation Services Secure Chat Preferred 9a-5:30pm Office: [phone]    Dorathy Kinsman [facility] 09/16/2021, 6:00 PM

## 2021-09-16 NOTE — Progress Notes (Signed)
PROGRESS NOTE                                                                                                                                                                                                             Patient Demographics:    Martin Mullen, is a 41 y.o. male, DOB - April 02, 1980, AUQ:[id number]  Outpatient Primary MD for the patient is [name], NP    LOS - 4  Admit date - 09/11/2021    Chief Complaint  Patient presents with   Code Stroke       Brief Narrative (HPI from H&P) -  41 y.o. 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    MRI brain was revealing for 6 mm acute ischemic nonhemorrhagic left ventral pons/midbrain infarct.  CTA head and neck with no large vessel occlusion.   Subjective:   Patient in bed, appears comfortable, denies any headache, no fever, no chest pain or pressure, no shortness of breath , no abdominal pain. No new focal weakness.   Assessment  & Plan :    Stroke   - 6 mm acute ischemic non-hemorrhagic left ventral pons/midbrain Infarct.  He has history of previous stroke, he also has history of prothrombin gene mutation, history of noncompliance with medications.  He does seem to have an acute stroke now, confirms that he was not taking any medications for 3 weeks prior to this hospital admission, counseled on compliance, currently on combination of aspirin and Eliquis, Crestor and insulin regimen also resumed.  A1c is greater than 14 LDL above goal. Echo stable full stroke work-up done.  Await placement to SNF/CIR.  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  They advise keeping on Eliquis  indefinitely.  HTN (hypertension)  Allowing for permissive hypertension  STEMI s/p RCA stent 08/2012 - Asymptomatic from a cardiac standpoint.  Continue Eliquis and aspirin combination along with statin for secondary prevention, ARB held to allow for permissive hypertension..  Dehydration with hypokalemia.  Hydrated with IV fluids.  Potassium replaced.  Type 2 diabetes mellitus with hyperlipidemia - poor control in the outpatient setting as evident by the  extremely high A1c, he was not taking any medications for 3 weeks, counseled on compliance, placed on long-acting insulin and sliding scale.  Monitor and adjust  CBG (last 3)  Recent Labs    09/15/21 1558 09/15/21 2128 09/16/21 0838  GLUCAP 120* 177* 118*   Lab Results  Component Value Date   HGBA1C >14 08/05/2021         Condition - Extremely Guarded  Family Communication  :  friend Wyatt Mage - [phone]  - on 09/12/21  Code Status :  Full  Consults  :  Neuro  PUD Prophylaxis : Eliquis   Procedures  :     MRI - 1. 6 mm acute ischemic nonhemorrhagic left ventral pons/midbrain infarct. 2. Underlying chronic microvascular ischemic disease with multiple remote lacunar infarcts involving the right corona radiata, basal ganglia, thalami, and pons.  CTA - 1. No acute intracranial abnormality or significant interval change. 2. Stable atrophy and diffuse white matter disease. 3. Remote lacunar infarcts involving the internal capsule bilaterally and left thalamus. 4. Aspects 10/10   TTE - 1. Left ventricular ejection fraction, by estimation, is 60 to 65%. The left ventricle has normal function. The left ventricle has no regional wall motion abnormalities. Left ventricular diastolic parameters were normal.  2. Right ventricular systolic function is normal. The right ventricular size is normal.  3. The mitral valve is normal in structure. No evidence of mitral valve regurgitation.  4. The aortic valve was not well visualized. Aortic  valve regurgitation is not visualized.  5. IVC not well visualized.       Disposition Plan  :    Status is: Observation  DVT Prophylaxis  :     apixaban (ELIQUIS) tablet 5 mg   Lab Results  Component Value Date   PLT 249 09/15/2021    Diet :  Diet Order             DIET - DYS 1 Room service appropriate? No; Fluid consistency: Nectar Thick  Diet effective now                    Inpatient Medications  Scheduled Meds:   stroke: early stages of recovery book   Does not apply Once   apixaban  5 mg Oral BID   aspirin EC  81 mg Oral Daily   buPROPion  150 mg Oral Daily   insulin aspart  0-20 Units Subcutaneous TID PC & HS   insulin glargine-yfgn  12 Units Subcutaneous Daily   nicotine  21 mg Transdermal Daily   rosuvastatin  20 mg Oral Daily   sodium chloride flush  3 mL Intravenous Once   Continuous Infusions:   PRN Meds:.acetaminophen **OR** acetaminophen (TYLENOL) oral liquid 160 mg/5 mL **OR** acetaminophen, food thickener  Antibiotics  :    Anti-infectives (From admission, onward)    None        Time Spent in minutes  30   Susa Raring M.D on 09/16/2021 at 8:43 AM  To page go to [email or web address]   [facility] -  Office  [phone]  See all Orders from today for further details    Objective:   Vitals:   09/15/21 1159 09/15/21 1600 09/16/21 0054 09/16/21 0642  BP: 122/78 115/84 128/87 131/86  Pulse: 77 94 88 88  Resp: 16 16 18    Temp: 98.2 F (36.8 C) 97.6 F (36.4 C) 97.9 F (36.6 C) 98.1 F (36.7 C)  TempSrc: Oral Axillary Oral Oral  SpO2: 94%  94% 95% 97%  Weight:      Height:        Wt Readings from Last 3 Encounters:  09/15/21 87.2 kg  08/05/21 88.5 kg  12/23/20 107 kg    No intake or output data in the 24 hours ending 09/16/21 0843     Physical Exam  Awake Alert, he has acute on chronic right-sided weakness are much weaker as compared to his right leg, strength in the right lower extremity 4/5 upper extremity  3.5/5,  .AT,PERRAL Supple Neck, No JVD,   Symmetrical Chest wall movement, Good air movement bilaterally, CTAB RRR,No Gallops, Rubs or new Murmurs,  +ve B.Sounds, Abd Soft, No tenderness,   No Cyanosis, Clubbing or edema       Data Review:    CBC Recent Labs  Lab 09/11/21 1719 09/11/21 1729 09/14/21 0204 09/15/21 0437  WBC 8.1  --  10.4 12.3*  HGB 14.9 14.3 15.2 15.3  HCT 42.6 42.0 42.3 44.1  PLT 213  --  224 249  MCV 90.4  --  88.9 90.7  MCH 31.6  --  31.9 31.5  MCHC 35.0  --  35.9 34.7  RDW 12.5  --  12.4 12.7  LYMPHSABS 3.0  --  3.8 3.9  MONOABS 0.5  --  0.6 0.7  EOSABS 0.7*  --  0.5 0.4  BASOSABS 0.1  --  0.1 0.1    Electrolytes Recent Labs  Lab 09/11/21 1719 09/11/21 1729 09/14/21 0204 09/15/21 0437 09/16/21 0511  NA 137 138 141 141 139  K 3.6 3.6 3.3* 3.4* 3.6  CL 106 103 109 110 109  CO2 23  --  25 23 23   GLUCOSE 266* 266* 97 92 114*  BUN 5* 4* 8 6 8   CREATININE 0.76 0.60* 0.72 0.74 0.69  CALCIUM 8.5*  --  9.1 8.8* 8.9  AST 9*  --   --   --   --   ALT 6  --   --   --   --   ALKPHOS 101  --   --   --   --   BILITOT 0.3  --   --   --   --   ALBUMIN 2.8*  --   --   --   --   MG  --   --  1.6* 1.8  --   INR 1.0  --   --   --   --     ------------------------------------------------------------------------------------------------------------------ No results for input(s): "CHOL", "HDL", "LDLCALC", "TRIG", "CHOLHDL", "LDLDIRECT" in the last 72 hours.   Lab Results  Component Value Date   HGBA1C >14 08/05/2021      Radiology Reports DG Swallowing Func-Speech Pathology  Result Date: 09/12/2021 Table formatting from the original result was not included. Objective Swallowing Evaluation: Type of Study: MBS-Modified Barium Swallow Study  Patient Details Name: TREYON WYMORE MRN: [id number]/14/[id number] Date of Birth: 1980-12-06 Today's Date: 09/12/2021 Time: SLP Start Time (ACUTE ONLY): 1445 -SLP Stop Time (ACUTE ONLY): 1505 SLP Time Calculation (min) (ACUTE  ONLY): 20 min Past Medical History: Past Medical History: Diagnosis Date  Acute transmural inferior wall MI (HCC) 08/28/2012  .5 x 12 mm Veri-flex stent non-DES.  Chicken pox   Coronary artery disease   Inferior ST elevation myocardial infarction in July of 2014. Cardiac catheterization showed 95% distal RCA stenosis and 90% first diagonal stenosis with normal ejection fraction. He underwent PCI in bare-metal stent placement to the distal RCA.  Diabetes mellitus without complication (  HCC)   Hypercholesterolemia   Stroke (HCC)   Tobacco use  Past Surgical History: Past Surgical History: Procedure Laterality Date  CORONARY ANGIOPLASTY WITH STENT PLACEMENT    DEBRIDEMENT AND CLOSURE WOUND N/A 09/03/2019  Procedure: Debridement and closure of scrotal wound;  Surgeon: Allena Napoleon, MD;  Location: [facility];  Service: Plastics;  Laterality: N/A;  DEBRIDEMENT AND CLOSURE WOUND N/A 07/25/2019  Procedure: DEBRIDEMENT AND CLOSURE WOUND;  Surgeon: Allena Napoleon, MD;  Location: [facility];  Service: Plastics;  Laterality: N/A;  INCISION AND DRAINAGE ABSCESS N/A 07/20/2019  Procedure: INCISION AND DRAINAGE ABSCESS;  Surgeon: Crista Elliot, MD;  Location: [facility];  Service: Urology;  Laterality: N/A;  LEFT HEART CATHETERIZATION WITH CORONARY ANGIOGRAM N/A 08/28/2012  Procedure: LEFT HEART CATHETERIZATION WITH CORONARY ANGIOGRAM;  Surgeon: Pamella Pert, MD;  Location: [facility];  Service: Cardiovascular;  Laterality: N/A;  TEE WITHOUT CARDIOVERSION N/A 09/23/2020  Procedure: TRANSESOPHAGEAL ECHOCARDIOGRAM (TEE);  Surgeon: Lamar Blinks, MD;  Location: [facility];  Service: Cardiovascular;  Laterality: N/A;  WRIST SURGERY  1988 HPI: Patient is a 41 y.o. male with PMH: recurrent strokes, HTN, STEMI s/p RCA stent 2014, DM-2 insulin dependent, HLD, cocaine use. He presented to the hospital on 09/11/21 with slurred speech. MRI brain revealed 6mm acute ischemic nonhemmorhagic left ventral pons/midbrain  infarct; CTA head and neck with no large vessel occlusion.  Subjective: awake, alert, cooperative  Recommendations for follow up therapy are one component of a multi-disciplinary discharge planning process, led by the attending physician.  Recommendations may be updated based on patient status, additional functional criteria and insurance authorization. Assessment / Plan / Recommendation   09/12/2021   4:00 PM Clinical Impressions Clinical Impression Patient presents with mild-mod oral phase and mild pharyngeal phase dysphagia as per this MBS.  During oral phase, patient exhibited brief holding of liquid boluses as he was observed to impulsively try to pour all of barium from cup into mouth before initiating a swallow. He also exhibited delays in anterior to posterior transit of puree solids and mechanical soft solids. With thin liquids, patient exhibited swallow intiation delays mainly at level of pyriform sinus but at times swallow was initiated at level of vallecular sinus. He exhibited fairly consistent flash penetration of thin liquids (PAS 2)  but with full clearance from laryngeal vestibule each time and no significant pharyngeal residuals post initial swallow. With nectar thick liquids and puree solids he exhibited swallow initiation delay at level of vallecular sinus but only one instance of flash penetration with nectar thick liquids. He did exhibit one instance of sensed, trace aspiration which occured during the swallow when patient took sip of thin liquid barium while still having some masticated mechanical soft solid in his mouth. His cough was strong and appeared to result in at least some of aspirate being ejected out  after passing through vocal cords. When performing esophageal sweep with barium tablet, the tablet as well as the puree solids that were taken along with it were observed in approximately upper thoracic portion of esophagus and an additional bite of puree helped to fully transit tablet  and puree throuth esophagus. SLP is recommending initiate Dys 1 (puree) solids and nectar thick liquids via cup sips (no straws) at this time. SLP will follow patient for diet toleration. SLP Visit Diagnosis Dysphagia, oropharyngeal phase (R13.12) Impact on safety and function Mild aspiration risk     09/12/2021   4:00 PM Treatment Recommendations Treatment Recommendations Therapy  as outlined in treatment plan below     09/12/2021   4:00 PM Prognosis Prognosis for Safe Diet Advancement Good   09/12/2021   4:00 PM Diet Recommendations SLP Diet Recommendations Dysphagia 1 (Puree) solids;Nectar thick liquid Liquid Administration via No straw;Cup Medication Administration Whole meds with puree Compensations Slow rate;Small sips/bites Postural Changes Seated upright at 90 degrees     09/12/2021   4:00 PM Other Recommendations Oral Care Recommendations Oral care BID;Staff/trained caregiver to provide oral care Other Recommendations Order thickener from pharmacy;Prohibited food (jello, ice cream, thin soups);Have oral suction available;Clarify dietary restrictions Follow Up Recommendations Acute inpatient rehab (3hours/day) Assistance recommended at discharge Frequent or constant Supervision/Assistance Functional Status Assessment Patient has had a recent decline in their functional status and demonstrates the ability to make significant improvements in function in a reasonable and predictable amount of time.   09/12/2021   4:00 PM Frequency and Duration  Speech Therapy Frequency (ACUTE ONLY) min 2x/week Treatment Duration 2 weeks     09/12/2021   4:00 PM Oral Phase Oral Phase Impaired Oral - Nectar Cup Reduced posterior propulsion Oral - Thin Cup Reduced posterior propulsion;Holding of bolus Oral - Thin Straw Reduced posterior propulsion Oral - Puree Weak lingual manipulation;Reduced posterior propulsion Oral - Mech Soft Weak lingual manipulation;Impaired mastication;Reduced posterior propulsion Oral - Pill Reduced posterior  propulsion    09/12/2021   4:00 PM Pharyngeal Phase Pharyngeal Phase Impaired Pharyngeal- Nectar Cup Delayed swallow initiation-vallecula;Delayed swallow initiation-pyriform sinuses;Reduced airway/laryngeal closure;Penetration/Aspiration during swallow;Pharyngeal residue - valleculae Pharyngeal Material enters airway, remains ABOVE vocal cords then ejected out Pharyngeal- Thin Cup Delayed swallow initiation-pyriform sinuses;Delayed swallow initiation-vallecula;Reduced airway/laryngeal closure;Penetration/Aspiration during swallow;Trace aspiration Pharyngeal Material enters airway, passes BELOW cords and not ejected out despite cough attempt by patient;Material enters airway, passes BELOW cords then ejected out Pharyngeal- Thin Straw Delayed swallow initiation-pyriform sinuses;Delayed swallow initiation-vallecula;Reduced airway/laryngeal closure;Penetration/Aspiration during swallow Pharyngeal Material enters airway, remains ABOVE vocal cords then ejected out Pharyngeal- Puree Delayed swallow initiation-vallecula Pharyngeal- Mechanical Soft Delayed swallow initiation-vallecula Pharyngeal- Pill [facility]    09/12/2021   4:00 PM Cervical Esophageal Phase  Cervical Esophageal Phase [facility] Angela Nevin, MA, CCC-SLP Speech Therapy                     ECHOCARDIOGRAM COMPLETE  Result Date: 09/12/2021    ECHOCARDIOGRAM REPORT   Patient Name:   BOHDEN DUNG Date of Exam: 09/12/2021 Medical Rec #:  [id number]       Height:       70.0 in Accession #:    [id number]      Weight:       193.6 lb Date of Birth:  07-04-1980       BSA:          2.059 m Patient Age:    41 years        BP:           109/84 mmHg Patient Gender: M               HR:           72 bpm. Exam Location:  Inpatient Procedure: 2D Echo Indications:    stroke  History:        Patient has prior history of Echocardiogram examinations, most                 recent 09/23/2020. CAD; Risk Factors:Hypertension, Dyslipidemia  and Diabetes.  Sonographer:    Delcie Roch RDCS Referring Phys: [id number] CHING T TU  Sonographer Comments: Suboptimal subcostal window and Technically difficult study due to poor echo windows. Image acquisition challenging due to respiratory motion. IMPRESSIONS  1. Left ventricular ejection fraction, by estimation, is 60 to 65%. The left ventricle has normal function. The left ventricle has no regional wall motion abnormalities. Left ventricular diastolic parameters were normal.  2. Right ventricular systolic function is normal. The right ventricular size is normal.  3. The mitral valve is normal in structure. No evidence of mitral valve regurgitation.  4. The aortic valve was not well visualized. Aortic valve regurgitation is not visualized.  5. IVC not well visualized. Conclusion(s)/Recommendation(s): No change from prior TEE. FINDINGS  Left Ventricle: Left ventricular ejection fraction, by estimation, is 60 to 65%. The left ventricle has normal function. The left ventricle has no regional wall motion abnormalities. The left ventricular internal cavity size was normal in size. There is  no left ventricular hypertrophy. Left ventricular diastolic parameters were normal. Right Ventricle: The right ventricular size is normal. Right ventricular systolic function is normal. Left Atrium: Left atrial size was normal in size. Right Atrium: Right atrial size was normal in size. Pericardium: There is no evidence of pericardial effusion. Mitral Valve: The mitral valve is normal in structure. No evidence of mitral valve regurgitation. Tricuspid Valve: Tricuspid valve regurgitation is not demonstrated. Aortic Valve: The aortic valve was not well visualized. Aortic valve regurgitation is not visualized. Pulmonic Valve: The pulmonic valve was not well visualized. Pulmonic valve regurgitation is not visualized. Aorta: The aortic root and ascending aorta are structurally normal, with no evidence of dilitation. Venous: IVC not well visualized. IAS/Shunts: The  interatrial septum was not well visualized.  LEFT VENTRICLE PLAX 2D LVIDd:         4.10 cm   Diastology LVIDs:         2.60 cm   LV e' medial:    7.51 cm/s LV PW:         1.00 cm   LV E/e' medial:  8.5 LV IVS:        0.90 cm   LV e' lateral:   14.00 cm/s LVOT diam:     1.90 cm   LV E/e' lateral: 4.6 LV SV:         58 LV SV Index:   28 LVOT Area:     2.84 cm  RIGHT VENTRICLE RV S prime:     10.10 cm/s TAPSE (M-mode): 1.5 cm LEFT ATRIUM           Index        RIGHT ATRIUM           Index LA diam:      3.50 cm 1.70 cm/m   RA Area:     12.60 cm LA Vol (A4C): 30.4 ml 14.77 ml/m  RA Volume:   28.00 ml  13.60 ml/m  AORTIC VALVE LVOT Vmax:   99.20 cm/s LVOT Vmean:  67.600 cm/s LVOT VTI:    0.206 m  AORTA Ao Root diam: 3.30 cm MITRAL VALVE MV Area (PHT): 3.85 cm    SHUNTS MV Decel Time: 197 msec    Systemic VTI:  0.21 m MV E velocity: 64.20 cm/s  Systemic Diam: 1.90 cm MV A velocity: 57.70 cm/s MV E/A ratio:  1.11 Photographer signed by Carolan Clines Signature Date/Time: 09/12/2021/11:48:29 AM    Final

## 2021-09-17 ENCOUNTER — Other Ambulatory Visit (HOSPITAL_COMMUNITY): Payer: Self-pay

## 2021-09-17 LAB — BASIC METABOLIC PANEL
Anion gap: 9 (ref 5–15)
BUN: 11 mg/dL (ref 6–20)
CO2: 25 mmol/L (ref 22–32)
Calcium: 9.5 mg/dL (ref 8.9–10.3)
Chloride: 105 mmol/L (ref 98–111)
Creatinine, Ser: 0.84 mg/dL (ref 0.61–1.24)
GFR, Estimated: >60 mL/min (ref >60)
Glucose, Bld: 105 mg/dL — ABNORMAL HIGH (ref 70–99)
Potassium: 3.6 mmol/L (ref 3.5–5.1)
Sodium: 139 mmol/L (ref 135–145)

## 2021-09-17 LAB — GLUCOSE, CAPILLARY
Glucose-Capillary: 127 mg/dL — ABNORMAL HIGH (ref 70–99)
Glucose-Capillary: 181 mg/dL — ABNORMAL HIGH (ref 70–99)
Glucose-Capillary: 112 mg/dL — ABNORMAL HIGH (ref 70–99)
Glucose-Capillary: 180 mg/dL — ABNORMAL HIGH (ref 70–99)

## 2021-09-17 NOTE — Progress Notes (Signed)
PROGRESS NOTE                                                                                                                                                                                                             Patient Demographics:    Martin Mullen, is a 41 y.o. male, DOB - December 04, 1980, NIO:[id number]  Outpatient Primary MD for the patient is [name], NP    LOS - 5  Admit date - 09/11/2021    Chief Complaint  Patient presents with   Code Stroke       Brief Narrative (HPI from H&P) -  41 y.o. 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    MRI brain was revealing for 6 mm acute ischemic nonhemorrhagic left ventral pons/midbrain infarct.  CTA head and neck with no large vessel occlusion.   Subjective:   Patient in bed, appears comfortable, denies any headache, no fever, no chest pain or pressure, no shortness of breath , no abdominal pain. No new focal weakness.   Assessment  & Plan :    Stroke   - 6 mm acute ischemic non-hemorrhagic left ventral pons/midbrain Infarct.  He has history of previous stroke, he also has history of prothrombin gene mutation, history of noncompliance with medications.  He does seem to have an acute stroke now, confirms that he was not taking any medications for 3 weeks prior to this hospital admission, counseled on compliance, currently on combination of aspirin and Eliquis, Crestor and insulin regimen also resumed.  A1c is greater than 14 LDL above goal. Echo stable full stroke work-up done.  Await placement to SNF/CIR.  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  They advise keeping on Eliquis  indefinitely.  HTN (hypertension)  Allowing for permissive hypertension  STEMI s/p RCA stent 08/2012 - Asymptomatic from a cardiac standpoint.  Continue Eliquis and aspirin combination along with statin for secondary prevention, ARB held to allow for permissive hypertension..  Dehydration with hypokalemia.  Hydrated with IV fluids.  Potassium replaced.  Type 2 diabetes mellitus with hyperlipidemia - poor control in the outpatient setting as evident by the  extremely high A1c, he was not taking any medications for 3 weeks, counseled on compliance, placed on long-acting insulin and sliding scale.  Monitor and adjust  CBG (last 3)  Recent Labs    09/16/21 1648 09/16/21 2215 09/17/21 0737  GLUCAP 135* 176* 127*   Lab Results  Component Value Date   HGBA1C >14 08/05/2021         Condition - Extremely Guarded  Family Communication  :  friend Wyatt Mage - [phone]  - on 09/12/21  Code Status :  Full  Consults  :  Neuro  PUD Prophylaxis : Eliquis   Procedures  :     MRI - 1. 6 mm acute ischemic nonhemorrhagic left ventral pons/midbrain infarct. 2. Underlying chronic microvascular ischemic disease with multiple remote lacunar infarcts involving the right corona radiata, basal ganglia, thalami, and pons.  CTA - 1. No acute intracranial abnormality or significant interval change. 2. Stable atrophy and diffuse white matter disease. 3. Remote lacunar infarcts involving the internal capsule bilaterally and left thalamus. 4. Aspects 10/10   TTE - 1. Left ventricular ejection fraction, by estimation, is 60 to 65%. The left ventricle has normal function. The left ventricle has no regional wall motion abnormalities. Left ventricular diastolic parameters were normal.  2. Right ventricular systolic function is normal. The right ventricular size is normal.  3. The mitral valve is normal in structure. No evidence of mitral valve regurgitation.  4. The aortic valve was not well visualized. Aortic  valve regurgitation is not visualized.  5. IVC not well visualized.       Disposition Plan  :    Status is: Observation  DVT Prophylaxis  :    Place TED hose Start: 09/16/21 1807 apixaban (ELIQUIS) tablet 5 mg   Lab Results  Component Value Date   PLT 249 09/15/2021    Diet :  Diet Order             DIET - DYS 1 Room service appropriate? No; Fluid consistency: Nectar Thick  Diet effective now                    Inpatient Medications  Scheduled Meds:   stroke: early stages of recovery book   Does not apply Once   apixaban  5 mg Oral BID   aspirin EC  81 mg Oral Daily   buPROPion  150 mg Oral Daily   insulin aspart  0-20 Units Subcutaneous TID PC & HS   insulin glargine-yfgn  12 Units Subcutaneous Daily   nicotine  21 mg Transdermal Daily   mouth rinse  15 mL Mouth Rinse 4 times per day   rosuvastatin  20 mg Oral Daily   sodium chloride flush  3 mL Intravenous Once   Continuous Infusions:   PRN Meds:.acetaminophen **OR** acetaminophen (TYLENOL) oral liquid 160 mg/5 mL **OR** acetaminophen, food thickener, mouth rinse  Antibiotics  :    Anti-infectives (From admission, onward)    None        Time Spent in minutes  30   Susa Raring M.D on 09/17/2021 at 10:14 AM  To page go to [email or web address]   [facility] -  Office  [phone]  See all Orders from today for further details    Objective:   Vitals:   09/16/21 1958 09/17/21 0000 09/17/21 0400 09/17/21 0744  BP: 109/73 104/68 119/66 132/88  Pulse: 80   66  Resp: 18   16  Temp: 97.6 F (36.4 C)  97.8 F (36.6 C)  TempSrc: Axillary   Oral  SpO2: 99% 99% 99% 99%  Weight:      Height:        Wt Readings from Last 3 Encounters:  09/15/21 87.2 kg  08/05/21 88.5 kg  12/23/20 107 kg     Intake/Output Summary (Last 24 hours) at 09/17/2021 1014 Last data filed at 09/17/2021 0800 Gross per 24 hour  Intake 720 ml  Output 550 ml  Net 170 ml       Physical Exam  Awake Alert, he  has acute on chronic right-sided weakness are much weaker as compared to his right leg, strength in the right lower extremity 4/5 upper extremity 3.5/5,  [place].AT,PERRAL Supple Neck, No JVD,   Symmetrical Chest wall movement, Good air movement bilaterally, CTAB RRR,No Gallops, Rubs or new Murmurs,  +ve B.Sounds, Abd Soft, No tenderness,   No Cyanosis, Clubbing or edema    Data Review:    CBC Recent Labs  Lab 09/11/21 1719 09/11/21 1729 09/14/21 0204 09/15/21 0437  WBC 8.1  --  10.4 12.3*  HGB 14.9 14.3 15.2 15.3  HCT 42.6 42.0 42.3 44.1  PLT 213  --  224 249  MCV 90.4  --  88.9 90.7  MCH 31.6  --  31.9 31.5  MCHC 35.0  --  35.9 34.7  RDW 12.5  --  12.4 12.7  LYMPHSABS 3.0  --  3.8 3.9  MONOABS 0.5  --  0.6 0.7  EOSABS 0.7*  --  0.5 0.4  BASOSABS 0.1  --  0.1 0.1    Electrolytes Recent Labs  Lab 09/11/21 1719 09/11/21 1729 09/14/21 0204 09/15/21 0437 09/16/21 0511 09/17/21 0454  NA 137 138 141 141 139 139  K 3.6 3.6 3.3* 3.4* 3.6 3.6  CL 106 103 109 110 109 105  CO2 23  --  25 23 23 25   GLUCOSE 266* 266* 97 92 114* 105*  BUN 5* 4* 8 6 8 11   CREATININE 0.76 0.60* 0.72 0.74 0.69 0.84  CALCIUM 8.5*  --  9.1 8.8* 8.9 9.5  AST 9*  --   --   --   --   --   ALT 6  --   --   --   --   --   ALKPHOS 101  --   --   --   --   --   BILITOT 0.3  --   --   --   --   --   ALBUMIN 2.8*  --   --   --   --   --   MG  --   --  1.6* 1.8  --   --   INR 1.0  --   --   --   --   --     ------------------------------------------------------------------------------------------------------------------ No results for input(s): "CHOL", "HDL", "LDLCALC", "TRIG", "CHOLHDL", "LDLDIRECT" in the last 72 hours.   Lab Results  Component Value Date   HGBA1C >14 08/05/2021      Radiology Reports No results found.

## 2021-09-17 NOTE — Progress Notes (Signed)
Physical Therapy Treatment Patient Details Name: Martin Mullen MRN: [id number] DOB: 12-27-1980 Today's Date: 09/17/2021   History of Present Illness Pt is a 41 y/o M presenting to ED on 7/29 with slurred speech, MRI revealing 10mm acute inschemic non-hemorrhagic L ventral pons/midbrain infarct. CTA head and neck with no large vessel occlusion. PMH significant for STEMI s/p RCA stent 2014, HTN, recurrent strokes, insulin-dependent DM2, hyperlipidemia, substance use disorder.    PT Comments    Pt received in supine, sleeping but easily awoken, pt agreeable to pre-gait, transfer and exercise instruction for BLE strengthening. Pt denies dizziness or lightheadedness but standing tolerance limited to 3-4 minutes prior to pt with decreased verbal responsiveness needing seated rest breaks. Pt needing consistent modA while performing pre-gait standing tasks at bedside and unable to progress gait distance today due to likely orthostatic hypotension and lack of +2 assist. Pt with decreased R attention, encouraged pt to utilize RUE more for ADL/repositioning and mobility for improved neuro feedback, will need reinforcement. Pt continues to benefit from PT services to progress toward functional mobility goals.    Recommendations for follow up therapy are one component of a multi-disciplinary discharge planning process, led by the attending physician.  Recommendations may be updated based on patient status, additional functional criteria and insurance authorization.  Follow Up Recommendations  Acute inpatient rehab (3hours/day)     Assistance Recommended at Discharge Frequent or constant Supervision/Assistance  Patient can return home with the following A lot of help with walking and/or transfers;A lot of help with bathing/dressing/bathroom;Assistance with cooking/housework;Direct supervision/assist for medications management;Direct supervision/assist for financial management;Assist for transportation;Help with  stairs or ramp for entrance   Equipment Recommendations  Other (comment) (TBD)    Recommendations for Other Services Rehab consult     Precautions / Restrictions Precautions Precautions: Fall Precaution Comments: tendency to R knee buckle, asymptomatic orthostatic hypotension Restrictions Weight Bearing Restrictions: No     Mobility  Bed Mobility Overal bed mobility: Needs Assistance Bed Mobility: Sidelying to Sit   Sidelying to sit: Mod assist       General bed mobility comments: modA for trunk rise, pt pulling up on bed side rail    Transfers Overall transfer level: Needs assistance Equipment used: Rolling walker (2 wheels) Transfers: Sit to/from Stand, Bed to chair/wheelchair/BSC Sit to Stand: Mod assist, Min assist   Step pivot transfers: Mod assist       General transfer comment: minA to rise but up to modA steadying assist upon standing and especially while performing pivotal steps from bed>chair. pt with orthostatic sx with pre-gait tasks so unable to progress gait safely with +1 assist    Ambulation/Gait Ambulation/Gait assistance: Mod assist   Assistive device: Rolling walker (2 wheels)       Pre-gait activities: pre-gait hip flexion x10 reps       Modified Rankin (Stroke Patients Only) Modified Rankin (Stroke Patients Only) Pre-Morbid Rankin Score: No symptoms Modified Rankin: Moderately severe disability     Balance Overall balance assessment: Needs assistance Sitting-balance support: No upper extremity supported, Feet supported Sitting balance-Leahy Scale: Good     Standing balance support: Bilateral upper extremity supported, Reliant on assistive device for balance Standing balance-Leahy Scale: Poor Standing balance comment: reliant on external support.                            Cognition Arousal/Alertness: Awake/alert Behavior During Therapy: Flat affect Overall Cognitive Status: Impaired/Different from baseline  Safety/Judgement: Decreased awareness of safety   Problem Solving: Slow processing, Difficulty sequencing, Decreased initiation, Requires verbal cues General Comments: Alert to self, place and situation. Able to follow all commands. Slower processing and slow verbal responses. Asymptomatic orthostatic hypotension during PT session today (knee high TED hose donned). Pt states he enjoys "90's country music" so music played on Youtube during/after session and pt appreciative.        Exercises Other Exercises Other Exercises: seated BLE AROM: LAQ, hip flexion x20 reps ea Other Exercises: STS x 5 reps Other Exercises: standing hip flexion x10 reps    General Comments        Pertinent Vitals/Pain Pain Assessment Pain Assessment: No/denies pain           PT Goals (current goals can now be found in the care plan section) Acute Rehab PT Goals Patient Stated Goal: to get home and play pool PT Goal Formulation: With patient Time For Goal Achievement: 09/27/21 Progress towards PT goals: Progressing toward goals    Frequency    Min 4X/week      PT Plan Current plan remains appropriate       AM-PAC PT "6 Clicks" Mobility   Outcome Measure  Help needed turning from your back to your side while in a flat bed without using bedrails?: A Little Help needed moving from lying on your back to sitting on the side of a flat bed without using bedrails?: A Lot Help needed moving to and from a bed to a chair (including a wheelchair)?: A Lot Help needed standing up from a chair using your arms (e.g., wheelchair or bedside chair)?: A Lot Help needed to walk in hospital room?: Total (too orthostatic to attempt) Help needed climbing 3-5 steps with a railing? : Total 6 Click Score: 11    End of Session Equipment Utilized During Treatment: Gait belt Activity Tolerance: Patient tolerated treatment well;Treatment limited secondary to medical complications  (Comment) (asymptomatic orthostatic hypotension) Patient left: in chair;with call bell/phone within reach;with chair alarm set (set up for dinner) Nurse Communication: Mobility status;Other (comment);Precautions (pt may need Supervision while eating dinner due to poor attention span; orthostatic hypotension) PT Visit Diagnosis: Other abnormalities of gait and mobility (R26.89);Other symptoms and signs involving the nervous system (R29.898)     Time: 7564-3329 PT Time Calculation (min) (ACUTE ONLY): 27 min  Charges:  $Gait Training: 8-22 mins $Therapeutic Exercise: 8-22 mins                     Vernis Cabacungan P., PTA Acute Rehabilitation Services Secure Chat Preferred 9a-5:30pm Office: [phone]    Dorathy Kinsman [facility] 09/17/2021, 5:54 PM

## 2021-09-17 NOTE — NC FL2 (Signed)
[place] MEDICAID FL2 LEVEL OF CARE SCREENING TOOL     IDENTIFICATION  Patient Name: Martin Mullen Birthdate: [birth year] Sex: male Admission Date (Current Location): 09/11/2021  [facility] and [place] Number:  Producer, television/film/video and Address:  [facility] [place], [address] [place], [place] [address]      Provider Number: [id number]  Attending Physician Name and Address:  Leroy Sea, MD  Relative Name and Phone Number:       Current Level of Care: Hospital Recommended Level of Care: Skilled Nursing Facility Prior Approval Number:    Date Approved/Denied:   PASRR Number: [id number]  Discharge Plan: SNF    Current Diagnoses: Patient Active Problem List   Diagnosis Date Noted   Stroke [facility]) 09/11/2021   Overweight with body mass index (BMI) of 27 to 27.9 in adult 10/08/2020   Recurrent strokes (HCC) 08/27/2020   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) 03/25/2020   GERD (gastroesophageal reflux disease) 07/20/2019   Coronary artery disease    Type 2 diabetes mellitus with hyperlipidemia (HCC) 07/09/2013   STEMI s/p RCA stent 08/2012 09/05/2012   HTN (hypertension) 09/05/2012   HLD (hyperlipidemia) 09/05/2012   Adjustment disorder with mixed anxiety and depressed mood 09/05/2012    Orientation RESPIRATION BLADDER Height & Weight     Self, Time, Situation, Place  Normal Continent Weight: 192 lb 3.9 oz (87.2 kg) Height:  5\' 10"  (177.8 cm)  BEHAVIORAL SYMPTOMS/MOOD NEUROLOGICAL BOWEL NUTRITION STATUS      Continent Diet (See discharge summary.)  AMBULATORY STATUS COMMUNICATION OF NEEDS Skin   Extensive Assist  (Slurred speech) Normal                       Personal Care Assistance Level of Assistance  Bathing, Feeding, Dressing Bathing Assistance: Maximum assistance Feeding assistance: Maximum assistance Dressing Assistance: Maximum assistance     Functional Limitations Info  Sight, Hearing Sight Info:  Impaired Hearing Info: Adequate      SPECIAL CARE FACTORS FREQUENCY  PT (By licensed PT), OT (By licensed OT)     PT Frequency: 5x/week OT Frequency: 5x/week            Contractures Contractures Info: Not present    Additional Factors Info  Code Status, Allergies, Insulin Sliding Scale Code Status Info: Full Code status. Allergies Info: No known allergies.   Insulin Sliding Scale Info: Insulin aspart 0-20 units 3x/day after meals and bedtime; Insulin glargine-yfgn injuction 12 units       Current Medications (09/17/2021):  This is the current hospital active medication list Current Facility-Administered Medications  Medication Dose Route Frequency Provider Last Rate Last Admin    stroke: early stages of recovery book   Does not apply Once Tu, Ching T, DO       acetaminophen (TYLENOL) tablet 650 mg  650 mg Oral Q4H PRN Tu, Ching T, DO       Or   acetaminophen (TYLENOL) 160 MG/5ML solution 650 mg  650 mg Per Tube Q4H PRN Tu, Ching T, DO       Or   acetaminophen (TYLENOL) suppository 650 mg  650 mg Rectal Q4H PRN Tu, Ching T, DO       apixaban (ELIQUIS) tablet 5 mg  5 mg Oral BID Tu, Ching T, DO   5 mg at 09/17/21 0813   aspirin EC tablet 81 mg  81 mg Oral Daily Tu, Ching T, DO   81 mg at  09/17/21 0813   buPROPion (WELLBUTRIN XL) 24 hr tablet 150 mg  150 mg Oral Daily Leroy Sea, MD   150 mg at 09/17/21 0813   food thickener (SIMPLYTHICK (NECTAR/LEVEL 2/MILDLY THICK)) 10 packet  10 packet Oral PRN Leroy Sea, MD       insulin aspart (novoLOG) injection 0-20 Units  0-20 Units Subcutaneous TID PC & HS Tu, Ching T, DO   4 Units at 09/17/21 1301   insulin glargine-yfgn (SEMGLEE) injection 12 Units  12 Units Subcutaneous Daily Leroy Sea, MD   12 Units at 09/17/21 0813   nicotine (NICODERM CQ - dosed in mg/24 hours) patch 21 mg  21 mg Transdermal Daily Opyd, Lavone Neri, MD   21 mg at 09/17/21 0932   Oral care mouth rinse  15 mL Mouth Rinse 4 times per day Leroy Sea, MD   15 mL at 09/17/21 1302   Oral care mouth rinse  15 mL Mouth Rinse PRN Leroy Sea, MD       rosuvastatin (CRESTOR) tablet 20 mg  20 mg Oral Daily Elmer Picker, NP   20 mg at 09/17/21 0813   sodium chloride flush (NS) 0.9 % injection 3 mL  3 mL Intravenous Once Lonell Grandchild, MD         Discharge Medications: Please see discharge summary for a list of discharge medications.  Relevant Imaging Results:  Relevant Lab Results:   Additional Information SSN: [id number]  Renne Crigler Ramiya Delahunty, LCSW

## 2021-09-17 NOTE — TOC Progression Note (Signed)
Transition of Care [facility]) - Progression Note    Patient Details  Name: Martin Mullen MRN: [id number] Date of Birth: 1980/12/19  Transition of Care [facility]) CM/SW Contact  Dale [place], Student-Social Work Phone Number: 09/17/2021, 12:23 PM  Clinical Narrative:    MSW intern spoke with patient at bedside regarding the possible discharge plan to SNF. MSW intern asked patient what his income was. Patient advised he currently works at [employer] and has insurance through them [employer]), but did not have his insurance card with him in the room. When questioned about his employment, patient then stated he no longer works for [employer] but still has insurance through them. Patient stated he also receives a check of $1300 a month from disability. MSW intern advised the hospital has on file he has no insurance, which may potentially become a barrier for the SNF placement.    Expected Discharge Plan: IP Rehab Facility Barriers to Discharge: Continued Medical Work up  Expected Discharge Plan and Services Expected Discharge Plan: IP Rehab Facility                                               Social Determinants of Health (SDOH) Interventions    Readmission Risk Interventions     No data to display

## 2021-09-17 NOTE — Plan of Care (Signed)
  Problem: Coping: Goal: Ability to adjust to condition or change in health will improve Outcome: Progressing   Problem: Fluid Volume: Goal: Ability to maintain a balanced intake and output will improve Outcome: Progressing   Problem: Health Behavior/Discharge Planning: Goal: Ability to identify and utilize available resources and services will improve Outcome: Progressing   Problem: Health Behavior/Discharge Planning: Goal: Ability to manage health-related needs will improve Outcome: Progressing   

## 2021-09-17 NOTE — Progress Notes (Signed)
Speech Language Pathology Treatment: Dysphagia  Patient Details Name: Martin Mullen MRN: [id number] DOB: 1980/04/03 Today's Date: 09/17/2021 Time: 8416-6063 SLP Time Calculation (min) (ACUTE ONLY): 35 min  Assessment / Plan / Recommendation Clinical Impression  Pt seen at bedside for skilled ST intervention targeting goals for dysphagia. Pt was seated upright in recliner. No family or visitors present. Upon arrival of SLP, pt was observed to have anterior spillage of oral secretions. This was observed later in the session as well.   Oral care was completed with suction, following which pt accepted trials of ice chips, thin liquid, and graham cracker. Pt exhibited oral holding of ice chips. Pt followed directions to take small cup sips of water. Wet voice quality noted after thin liquid trials. Pt had difficulty with volitional cough/throat clear. Delayed strong reflexive cough noted after thin liquid trials. Several minutes later, pt was given a small piece of graham cracker x2. Immediate cough response elicited.   Given pt response to thin liquids and solid trials, in addition to weak throat clear/cough response, recommend continuing with puree diet and nectar thick liquids at this time.    HPI HPI: Patient is a 41 y.o. male with PMH: recurrent strokes, HTN, STEMI s/p RCA stent 2014, DM-2 insulin dependent, HLD, cocaine use. He presented to the hospital on 09/11/21 with slurred speech. MRI brain revealed 42mm acute ischemic nonhemmorhagic left ventral pons/midbrain infarct; CTA head and neck with no large vessel occlusion.      SLP Plan  Continue with current plan of care      Recommendations for follow up therapy are one component of a multi-disciplinary discharge planning process, led by the attending physician.  Recommendations may be updated based on patient status, additional functional criteria and insurance authorization.    Recommendations  Diet recommendations: Dysphagia 1  (puree);Nectar-thick liquid Liquids provided via: Cup;No straw Medication Administration: Crushed with puree Supervision: Staff to assist with self feeding Compensations: Slow rate;Small sips/bites;Minimize environmental distractions Postural Changes and/or Swallow Maneuvers: Seated upright 90 degrees;Upright 30-60 min after meal                Oral Care Recommendations: Oral care BID;Staff/trained caregiver to provide oral care Follow Up Recommendations: Follow physician's recommendations for discharge plan and follow up therapies Assistance recommended at discharge: Frequent or constant Supervision/Assistance SLP Visit Diagnosis: Dysphagia, oropharyngeal phase (R13.12) Plan: Continue with current plan of care          Yan Pankratz B. Murvin Natal, [facility], CCC-SLP Speech Language Pathologist Office: [phone]  [facility] 09/17/2021, 1:07 PM

## 2021-09-17 NOTE — TOC Progression Note (Signed)
Transition of Care [facility]) - Progression Note    Patient Details  Name: Martin Mullen MRN: [id number] Date of Birth: 1980/08/28  Transition of Care [facility]) CM/SW Contact  Mearl Latin, LCSW Phone Number: 09/17/2021, 3:31 PM  Clinical Narrative:    CSW emailed Financial Counseling to follow up on Medicaid referral and requested patient be evaluated by therapy [facility] program.    Expected Discharge Plan: IP Rehab Facility Barriers to Discharge: Continued Medical Work up  Expected Discharge Plan and Services Expected Discharge Plan: IP Rehab Facility                                               Social Determinants of Health (SDOH) Interventions    Readmission Risk Interventions     No data to display

## 2021-09-17 NOTE — Discharge Instructions (Addendum)

## 2021-09-17 NOTE — TOC CAGE-AID Note (Signed)
Transition of Care [facility]) - CAGE-AID Screening   Patient Details  Name: Martin Mullen MRN: [id number] Date of Birth: Jun 05, 1980  Transition of Care [facility]) CM/SW Contact:    Dale Banner Hill, Student-Social Work Phone Number: 09/17/2021, 12:21 PM   Clinical Narrative: MSW intern completed CAGE-AID on patient at bedside. MSW intern noticed patient was slurring speech and drooling at the mouth (alerted RN). Patient advised he has not used any substances in the past two years when his girlfriend found out she was pregnant. Patient followed up with stating his girlfriend is still pregnant. MSW intern asked if he would like any community resources, but patient denied.    CAGE-AID Screening:    Have You Ever Felt You Ought to Cut Down on Your Drinking or Drug Use?: No Have People Annoyed You By Critizing Your Drinking Or Drug Use?: No Have You Felt Bad Or Guilty About Your Drinking Or Drug Use?: No Have You Ever Had a Drink or Used Drugs First Thing In The Morning to Steady Your Nerves or to Get Rid of a Hangover?: No CAGE-AID Score: 0  Substance Abuse Education Offered: No

## 2021-09-18 LAB — GLUCOSE, CAPILLARY
Glucose-Capillary: 115 mg/dL — ABNORMAL HIGH (ref 70–99)
Glucose-Capillary: 206 mg/dL — ABNORMAL HIGH (ref 70–99)
Glucose-Capillary: 202 mg/dL — ABNORMAL HIGH (ref 70–99)
Glucose-Capillary: 139 mg/dL — ABNORMAL HIGH (ref 70–99)
Glucose-Capillary: 131 mg/dL — ABNORMAL HIGH (ref 70–99)

## 2021-09-18 MED ORDER — LACTATED RINGERS IV BOLUS
1000.0000 mL | Freq: Once | INTRAVENOUS | Status: AC
  Administered 2021-09-18: 1000 mL via INTRAVENOUS

## 2021-09-18 NOTE — Progress Notes (Signed)
PROGRESS NOTE                                                                                                                                                                                                             Patient Demographics:    Martin Mullen, is a 41 y.o. male, DOB - 1980-07-07, GFQ:[id number]  Outpatient Primary MD for the patient is [name], NP    LOS - 6  Admit date - 09/11/2021    Chief Complaint  Patient presents with   Code Stroke       Brief Narrative (HPI from H&P) -  41 y.o. 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    MRI brain was revealing for 6 mm acute ischemic nonhemorrhagic left ventral pons/midbrain infarct.  CTA head and neck with no large vessel occlusion.   Subjective:   Patient in bed, appears comfortable, denies any headache, no fever, no chest pain or pressure, no shortness of breath , no abdominal pain. No new focal weakness.'   Assessment  & Plan :    Stroke   - 6 mm acute ischemic non-hemorrhagic left ventral pons/midbrain Infarct.  He has history of previous stroke, he also has history of prothrombin gene mutation, history of noncompliance with medications.  He does seem to have an acute stroke now, confirms that he was not taking any medications for 3 weeks prior to this hospital admission, counseled on compliance, currently on combination of aspirin and Eliquis, Crestor and insulin regimen also resumed.  A1c is greater than 14 LDL above goal. Echo stable full stroke work-up done.  Await placement to SNF/CIR.  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  They advise keeping on Eliquis  indefinitely.  HTN (hypertension)  Allowing for permissive hypertension  STEMI s/p RCA stent 08/2012 - Asymptomatic from a cardiac standpoint.  Continue Eliquis and aspirin combination along with statin for secondary prevention, ARB held to allow for permissive hypertension..  Dehydration with hypokalemia.  Potassium replaced, continue as needed hydration as needed with IV fluids.  Type 2 diabetes mellitus with hyperlipidemia - poor control in the outpatient setting  as evident by the extremely high A1c, he was not taking any medications for 3 weeks, counseled on compliance, placed on long-acting insulin and sliding scale.  Monitor and adjust  CBG (last 3)  Recent Labs    09/17/21 1641 09/17/21 2131 09/18/21 0806  GLUCAP 112* 180* 115*   Lab Results  Component Value Date   HGBA1C >14 08/05/2021         Condition - Extremely Guarded  Family Communication  :  friend Wyatt Mage - [phone]  - on 09/12/21  Code Status :  Full  Consults  :  Neuro  PUD Prophylaxis : Eliquis   Procedures  :     MRI - 1. 6 mm acute ischemic nonhemorrhagic left ventral pons/midbrain infarct. 2. Underlying chronic microvascular ischemic disease with multiple remote lacunar infarcts involving the right corona radiata, basal ganglia, thalami, and pons.  CTA - 1. No acute intracranial abnormality or significant interval change. 2. Stable atrophy and diffuse white matter disease. 3. Remote lacunar infarcts involving the internal capsule bilaterally and left thalamus. 4. Aspects 10/10   TTE - 1. Left ventricular ejection fraction, by estimation, is 60 to 65%. The left ventricle has normal function. The left ventricle has no regional wall motion abnormalities. Left ventricular diastolic parameters were normal.  2. Right ventricular systolic function is normal. The right ventricular size is normal.  3. The mitral valve is normal in structure. No evidence of mitral valve regurgitation.  4. The aortic valve was  not well visualized. Aortic valve regurgitation is not visualized.  5. IVC not well visualized.       Disposition Plan  :    Status is: Observation  DVT Prophylaxis  :    Place TED hose Start: 09/16/21 1807 apixaban (ELIQUIS) tablet 5 mg   Lab Results  Component Value Date   PLT 249 09/15/2021    Diet :  Diet Order             DIET - DYS 1 Room service appropriate? No; Fluid consistency: Nectar Thick  Diet effective now                    Inpatient Medications  Scheduled Meds:   stroke: early stages of recovery book   Does not apply Once   apixaban  5 mg Oral BID   aspirin EC  81 mg Oral Daily   buPROPion  150 mg Oral Daily   insulin aspart  0-20 Units Subcutaneous TID PC & HS   insulin glargine-yfgn  12 Units Subcutaneous Daily   nicotine  21 mg Transdermal Daily   mouth rinse  15 mL Mouth Rinse 4 times per day   rosuvastatin  20 mg Oral Daily   sodium chloride flush  3 mL Intravenous Once   Continuous Infusions:  lactated ringers      PRN Meds:.acetaminophen **OR** acetaminophen (TYLENOL) oral liquid 160 mg/5 mL **OR** acetaminophen, food thickener, mouth rinse  Antibiotics  :    Anti-infectives (From admission, onward)    None        Time Spent in minutes  30   Susa Raring M.D on 09/18/2021 at 9:05 AM  To page go to [email or web address]   [facility] -  Office  [phone]  See all Orders from today for further details    Objective:   Vitals:   09/17/21 1941 09/17/21 2322 09/18/21 0321 09/18/21 0800  BP: 124/79 (!) 140/96 123/85 95/67  Pulse: 76 89 84 69  Resp:  Temp: 98.8 F (37.1 C) 99.3 F (37.4 C) 98.8 F (37.1 C)   TempSrc: Oral Oral Oral   SpO2:    97%  Weight:      Height:        Wt Readings from Last 3 Encounters:  09/15/21 87.2 kg  08/05/21 88.5 kg  12/23/20 107 kg     Intake/Output Summary (Last 24 hours) at 09/18/2021 0905 Last data filed at 09/18/2021 0400 Gross per 24 hour  Intake 360 ml  Output 250  ml  Net 110 ml       Physical Exam  Awake Alert, he has acute on chronic right-sided weakness are much weaker as compared to his right leg, strength in the right lower extremity 4/5 upper extremity 3.5/5,  .AT,PERRAL Supple Neck, No JVD,   Symmetrical Chest wall movement, Good air movement bilaterally, CTAB RRR,No Gallops, Rubs or new Murmurs,  +ve B.Sounds, Abd Soft, No tenderness,   No Cyanosis, Clubbing or edema    Data Review:    CBC Recent Labs  Lab 09/11/21 1719 09/11/21 1729 09/14/21 0204 09/15/21 0437  WBC 8.1  --  10.4 12.3*  HGB 14.9 14.3 15.2 15.3  HCT 42.6 42.0 42.3 44.1  PLT 213  --  224 249  MCV 90.4  --  88.9 90.7  MCH 31.6  --  31.9 31.5  MCHC 35.0  --  35.9 34.7  RDW 12.5  --  12.4 12.7  LYMPHSABS 3.0  --  3.8 3.9  MONOABS 0.5  --  0.6 0.7  EOSABS 0.7*  --  0.5 0.4  BASOSABS 0.1  --  0.1 0.1    Electrolytes Recent Labs  Lab 09/11/21 1719 09/11/21 1729 09/14/21 0204 09/15/21 0437 09/16/21 0511 09/17/21 0454  NA 137 138 141 141 139 139  K 3.6 3.6 3.3* 3.4* 3.6 3.6  CL 106 103 109 110 109 105  CO2 23  --  25 23 23 25   GLUCOSE 266* 266* 97 92 114* 105*  BUN 5* 4* 8 6 8 11   CREATININE 0.76 0.60* 0.72 0.74 0.69 0.84  CALCIUM 8.5*  --  9.1 8.8* 8.9 9.5  AST 9*  --   --   --   --   --   ALT 6  --   --   --   --   --   ALKPHOS 101  --   --   --   --   --   BILITOT 0.3  --   --   --   --   --   ALBUMIN 2.8*  --   --   --   --   --   MG  --   --  1.6* 1.8  --   --   INR 1.0  --   --   --   --   --     ------------------------------------------------------------------------------------------------------------------ No results for input(s): "CHOL", "HDL", "LDLCALC", "TRIG", "CHOLHDL", "LDLDIRECT" in the last 72 hours.   Lab Results  Component Value Date   HGBA1C >14 08/05/2021      Radiology Reports No results found.

## 2021-09-18 NOTE — Plan of Care (Signed)
  Problem: Clinical Measurements: Goal: Diagnostic test results will improve Outcome: Progressing Goal: Respiratory complications will improve Outcome: Progressing Goal: Cardiovascular complication will be avoided Outcome: Progressing   Problem: Clinical Measurements: Goal: Respiratory complications will improve Outcome: Progressing   Problem: Clinical Measurements: Goal: Cardiovascular complication will be avoided Outcome: Progressing   Problem: Activity: Goal: Risk for activity intolerance will decrease Outcome: Progressing   Problem: Education: Goal: Knowledge of disease or condition will improve Outcome: Progressing Goal: Knowledge of secondary prevention will improve (SELECT ALL) Outcome: Progressing Goal: Knowledge of patient specific risk factors will improve (INDIVIDUALIZE FOR PATIENT) Outcome: Progressing   Problem: Education: Goal: Knowledge of secondary prevention will improve (SELECT ALL) Outcome: Progressing   Problem: Education: Goal: Knowledge of patient specific risk factors will improve (INDIVIDUALIZE FOR PATIENT) Outcome: Progressing

## 2021-09-18 NOTE — Progress Notes (Signed)
Physical Therapy Treatment Patient Details Name: Martin Mullen MRN: [id number] DOB: 10-Aug-1980 Today's Date: 09/18/2021   History of Present Illness Pt is a 41 y/o M presenting to ED on 7/29 with slurred speech, MRI revealing 59mm acute inschemic non-hemorrhagic L ventral pons/midbrain infarct. CTA head and neck with no large vessel occlusion. PMH significant for STEMI s/p RCA stent 2014, HTN, recurrent strokes, insulin-dependent DM2, hyperlipidemia, substance use.    PT Comments    Pt received in supine, agreeable to therapy session with good participation and tolerance for transfer and pre-gait standing exercises. Pt BP more stable than previous session but still dropped from 130/98 supine to 110/87 standing with pt appearing slower to respond to questions about symptoms. Pt family arriving for visit at end of session with small children present so session ended as pt asking to visit with family members who he hasn't seen in a week. Pt continues to benefit from PT services to progress toward functional mobility goals.   Recommendations for follow up therapy are one component of a multi-disciplinary discharge planning process, led by the attending physician.  Recommendations may be updated based on patient status, additional functional criteria and insurance authorization.  Follow Up Recommendations  Acute inpatient rehab (3hours/day)     Assistance Recommended at Discharge Frequent or constant Supervision/Assistance  Patient can return home with the following A lot of help with walking and/or transfers;A lot of help with bathing/dressing/bathroom;Assistance with cooking/housework;Direct supervision/assist for medications management;Direct supervision/assist for financial management;Assist for transportation;Help with stairs or ramp for entrance   Equipment Recommendations  Other (comment) (TBD)    Recommendations for Other Services Rehab consult     Precautions / Restrictions  Precautions Precautions: Fall Precaution Comments: tendency to R knee buckle, asymptomatic orthostatic hypotension Restrictions Weight Bearing Restrictions: No     Mobility  Bed Mobility Overal bed mobility: Needs Assistance Bed Mobility: Sidelying to Sit, Rolling Rolling: Min guard Sidelying to sit: Mod assist (light modA)       General bed mobility comments: modA for trunk rise, fair recall of technique    Transfers Overall transfer level: Needs assistance Equipment used: Rolling walker (2 wheels) Transfers: Sit to/from Stand Sit to Stand: Mod assist   Step pivot transfers: Min assist       General transfer comment: minA to rise but up to modA steadying assist upon standing. Lateral steps along EOB toward [facility] with assist to manage RW and cues for locking R knee for stability.    Ambulation/Gait Ambulation/Gait assistance: Mod assist   Assistive device: Rolling walker (2 wheels)       Pre-gait activities: pre-gait hip flexion x15 reps ea      Modified Rankin (Stroke Patients Only) Modified Rankin (Stroke Patients Only) Pre-Morbid Rankin Score: No symptoms Modified Rankin: Moderately severe disability     Balance Overall balance assessment: Needs assistance Sitting-balance support: No upper extremity supported, Feet supported Sitting balance-Leahy Scale: Good     Standing balance support: Bilateral upper extremity supported, Reliant on assistive device for balance Standing balance-Leahy Scale: Poor Standing balance comment: reliant on external support.                            Cognition Arousal/Alertness: Awake/alert Behavior During Therapy: Flat affect Overall Cognitive Status: Impaired/Different from baseline                           Safety/Judgement: Decreased awareness of  safety   Problem Solving: Slow processing, Difficulty sequencing, Decreased initiation, Requires verbal cues General Comments: Alert to self, place  and situation. Able to follow all commands. Slower processing and slow verbal responses. Asymptomatic orthostatic hypotension during PT session today (knee high TED hose donned).        Exercises Other Exercises Other Exercises: seated BLE AROM: hip flexion, LAQ x10 reps ea Other Exercises: pt reports compliance with supine HEP Other Exercises: STS and static standing ~3 mins for BLE strengthening Other Exercises: standing hip flexion x15 reps each LE    General Comments        Pertinent Vitals/Pain Pain Assessment Pain Assessment: No/denies pain     PT Goals (current goals can now be found in the care plan section) Acute Rehab PT Goals Patient Stated Goal: to get home and play pool PT Goal Formulation: With patient Time For Goal Achievement: 09/27/21 Progress towards PT goals: Progressing toward goals    Frequency    Min 4X/week      PT Plan Current plan remains appropriate       AM-PAC PT "6 Clicks" Mobility   Outcome Measure  Help needed turning from your back to your side while in a flat bed without using bedrails?: A Little Help needed moving from lying on your back to sitting on the side of a flat bed without using bedrails?: A Lot Help needed moving to and from a bed to a chair (including a wheelchair)?: A Lot Help needed standing up from a chair using your arms (e.g., wheelchair or bedside chair)?: A Lot Help needed to walk in hospital room?: Total (too orthostatic to attempt) Help needed climbing 3-5 steps with a railing? : Total 6 Click Score: 11    End of Session Equipment Utilized During Treatment: Gait belt Activity Tolerance: Patient tolerated treatment well;Treatment limited secondary to medical complications (Comment) (mild orthostasis, pt children arriving to room) Patient left: in chair;with call bell/phone within reach;with chair alarm set (set up for dinner) Nurse Communication: Mobility status;Other (comment);Precautions (pt may need  Supervision while eating dinner due to poor attention span; orthostatic hypotension) PT Visit Diagnosis: Other abnormalities of gait and mobility (R26.89);Other symptoms and signs involving the nervous system (R29.898)     Time: 1025-8527 PT Time Calculation (min) (ACUTE ONLY): 17 min  Charges:  $Therapeutic Exercise: 8-22 mins                     Kabria Hetzer P., PTA Acute Rehabilitation Services Secure Chat Preferred 9a-5:30pm Office: [phone]    [facility] 09/18/2021, 5:15 PM

## 2021-09-19 LAB — GLUCOSE, CAPILLARY
Glucose-Capillary: 97 mg/dL (ref 70–99)
Glucose-Capillary: 191 mg/dL — ABNORMAL HIGH (ref 70–99)
Glucose-Capillary: 249 mg/dL — ABNORMAL HIGH (ref 70–99)
Glucose-Capillary: 298 mg/dL — ABNORMAL HIGH (ref 70–99)

## 2021-09-19 NOTE — Progress Notes (Signed)
Physical Therapy Treatment Patient Details Name: Martin Mullen MRN: [id number] DOB: 11/09/1980 Today's Date: 09/19/2021   History of Present Illness Pt is a 41 y/o M presenting to ED on 7/29 with slurred speech, MRI revealing 64mm acute inschemic non-hemorrhagic L ventral pons/midbrain infarct. CTA head and neck with no large vessel occlusion. PMH significant for STEMI s/p RCA stent 2014, HTN, recurrent strokes, insulin-dependent DM2, hyperlipidemia, substance use.    PT Comments    Patient eager to participate and  progress his ambulation. With +2 for lines and chair follow and mod physical assist pt able to walk up to 20 ft. Requires assist to manage RW and for balance (especially with stepping backwards). Ambulated x 3 reps.     Recommendations for follow up therapy are one component of a multi-disciplinary discharge planning process, led by the attending physician.  Recommendations may be updated based on patient status, additional functional criteria and insurance authorization.  Follow Up Recommendations  Acute inpatient rehab (3hours/day)     Assistance Recommended at Discharge Frequent or constant Supervision/Assistance  Patient can return home with the following A lot of help with walking and/or transfers;A lot of help with bathing/dressing/bathroom;Assistance with cooking/housework;Direct supervision/assist for medications management;Direct supervision/assist for financial management;Assist for transportation;Help with stairs or ramp for entrance   Equipment Recommendations  Other (comment) (TBD)    Recommendations for Other Services Rehab consult     Precautions / Restrictions Precautions Precautions: Fall Precaution Comments: tendency to R knee buckle, asymptomatic orthostatic hypotension Restrictions Weight Bearing Restrictions: No     Mobility  Bed Mobility Overal bed mobility: Needs Assistance Bed Mobility: Sidelying to Sit, Rolling Rolling: Supervision (with  rail) Sidelying to sit: Mod assist (light modA)       General bed mobility comments: modA for trunk rise, fair recall of technique    Transfers Overall transfer level: Needs assistance Equipment used: Rolling walker (2 wheels) Transfers: Sit to/from Stand Sit to Stand: Mod assist           General transfer comment: minA to rise but up to modA steadying assist upon standing.    Ambulation/Gait Ambulation/Gait assistance: Mod assist, +2 safety/equipment Gait Distance (Feet): 15 Feet (seated rest; 20; seated rest; 20) Assistive device: Rolling walker (2 wheels) Gait Pattern/deviations: Step-through pattern, Decreased step length - right, Decreased stride length, Knee hyperextension - right, Knee hyperextension - left, Knees buckling, Decreased weight shift to right, Decreased dorsiflexion - right, Narrow base of support   Gait velocity interpretation: <1.8 ft/sec, indicate of risk for recurrent falls Pre-gait activities: standing marching with RW prior to gait General Gait Details: cues for wider steps and for sequencing with forward and backward steps; assist to control RW and for balance (especially when stepping backwards)   Stairs             Wheelchair Mobility    Modified Rankin (Stroke Patients Only) Modified Rankin (Stroke Patients Only) Pre-Morbid Rankin Score: No symptoms Modified Rankin: Moderately severe disability     Balance Overall balance assessment: Needs assistance Sitting-balance support: No upper extremity supported, Feet supported Sitting balance-Leahy Scale: Good     Standing balance support: Bilateral upper extremity supported, Reliant on assistive device for balance Standing balance-Leahy Scale: Poor Standing balance comment: reliant on external support.                            Cognition Arousal/Alertness: Awake/alert Behavior During Therapy: Flat affect Overall Cognitive Status: Impaired/Different from  baseline Area  of Impairment: Attention, Following commands, Safety/judgement, Awareness, Problem solving                   Current Attention Level: Sustained   Following Commands: Follows one step commands with increased time Safety/Judgement: Decreased awareness of safety Awareness: Intellectual Problem Solving: Slow processing, Difficulty sequencing, Decreased initiation, Requires verbal cues General Comments: Alert to self, place and situation. Able to follow all commands. Slower processing and slow verbal responses.        Exercises      General Comments        Pertinent Vitals/Pain Pain Assessment Pain Assessment: No/denies pain Faces Pain Scale: No hurt    Home Living                          Prior Function            PT Goals (current goals can now be found in the care plan section) Acute Rehab PT Goals Patient Stated Goal: to get home and play pool Time For Goal Achievement: 09/27/21 Potential to Achieve Goals: Good Progress towards PT goals: Progressing toward goals    Frequency    Min 4X/week      PT Plan Current plan remains appropriate    Co-evaluation              AM-PAC PT "6 Clicks" Mobility   Outcome Measure  Help needed turning from your back to your side while in a flat bed without using bedrails?: A Little Help needed moving from lying on your back to sitting on the side of a flat bed without using bedrails?: A Lot Help needed moving to and from a bed to a chair (including a wheelchair)?: A Lot Help needed standing up from a chair using your arms (e.g., wheelchair or bedside chair)?: A Lot Help needed to walk in hospital room?: Total Help needed climbing 3-5 steps with a railing? : Total 6 Click Score: 11    End of Session Equipment Utilized During Treatment: Gait belt Activity Tolerance: Patient tolerated treatment well Patient left: in chair;with call bell/phone within reach;with chair alarm set Nurse Communication:  Mobility status PT Visit Diagnosis: Other abnormalities of gait and mobility (R26.89);Other symptoms and signs involving the nervous system (D78.242)     Time: 3536-1443 PT Time Calculation (min) (ACUTE ONLY): 17 min  Charges:  $Gait Training: 8-22 mins                      Jerolyn Center, PT Acute Rehabilitation Services  Office [phone]    Zena Amos 09/19/2021, 10:03 AM

## 2021-09-19 NOTE — Progress Notes (Signed)
PROGRESS NOTE                                                                                                                                                                                                             Patient Demographics:    Martin Mullen, is a 41 y.o. male, DOB - April 13, 1980, RWE:[id number]  Outpatient Primary MD for the patient is [name], NP    LOS - 7  Admit date - 09/11/2021    Chief Complaint  Patient presents with   Code Stroke       Brief Narrative (HPI from H&P) -  41 y.o. 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    MRI brain was revealing for 6 mm acute ischemic nonhemorrhagic left ventral pons/midbrain infarct.  CTA head and neck with no large vessel occlusion.   Subjective:   Patient in bed, appears comfortable, denies any headache, no fever, no chest pain or pressure, no shortness of breath , no abdominal pain. No focal weakness.   Assessment  & Plan :    Stroke   - 6 mm acute ischemic non-hemorrhagic left ventral pons/midbrain Infarct.  He has history of previous stroke, he also has history of prothrombin gene mutation, history of noncompliance with medications.  He does seem to have an acute stroke now, confirms that he was not taking any medications for 3 weeks prior to this hospital admission, counseled on compliance, currently on combination of aspirin and Eliquis, Crestor and insulin regimen also resumed.  A1c is greater than 14 LDL above goal. Echo stable full stroke work-up done.  Await placement to SNF/CIR.  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  They advise keeping on Eliquis  indefinitely.  HTN (hypertension)  Allowing for permissive hypertension  STEMI s/p RCA stent 08/2012 - Asymptomatic from a cardiac standpoint.  Continue Eliquis and aspirin combination along with statin for secondary prevention, ARB held to allow for permissive hypertension..  Dehydration with hypokalemia.  Potassium replaced, continue as needed hydration as needed with IV fluids.  Type 2 diabetes mellitus with hyperlipidemia - poor control in the outpatient setting as  evident by the extremely high A1c, he was not taking any medications for 3 weeks, counseled on compliance, placed on long-acting insulin and sliding scale.  Monitor and adjust  CBG (last 3)  Recent Labs    09/18/21 2139 09/18/21 2230 09/19/21 0739  GLUCAP 139* 131* 97   Lab Results  Component Value Date   HGBA1C >14 08/05/2021         Condition - Extremely Guarded  Family Communication  :  friend Lawerance Bach - [phone]  - on 09/12/21, updated again on 09/18/2021 she is now the POA.  Code Status :  Full  Consults  :  Neuro  PUD Prophylaxis : Eliquis   Procedures  :     MRI - 1. 6 mm acute ischemic nonhemorrhagic left ventral pons/midbrain infarct. 2. Underlying chronic microvascular ischemic disease with multiple remote lacunar infarcts involving the right corona radiata, basal ganglia, thalami, and pons.  CTA - 1. No acute intracranial abnormality or significant interval change. 2. Stable atrophy and diffuse white matter disease. 3. Remote lacunar infarcts involving the internal capsule bilaterally and left thalamus. 4. Aspects 10/10   TTE - 1. Left ventricular ejection fraction, by estimation, is 60 to 65%. The left ventricle has normal function. The left ventricle has no regional wall motion abnormalities. Left ventricular diastolic parameters were normal.  2. Right ventricular systolic function is normal. The right ventricular size is normal.  3. The mitral valve is normal in structure. No evidence of mitral  valve regurgitation.  4. The aortic valve was not well visualized. Aortic valve regurgitation is not visualized.  5. IVC not well visualized.       Disposition Plan  :    Status is: Observation  DVT Prophylaxis  :    Place TED hose Start: 09/16/21 1807 apixaban (ELIQUIS) tablet 5 mg   Lab Results  Component Value Date   PLT 249 09/15/2021    Diet :  Diet Order             DIET - DYS 1 Room service appropriate? No; Fluid consistency: Nectar Thick  Diet effective now                    Inpatient Medications  Scheduled Meds:   stroke: early stages of recovery book   Does not apply Once   apixaban  5 mg Oral BID   aspirin EC  81 mg Oral Daily   buPROPion  150 mg Oral Daily   insulin aspart  0-20 Units Subcutaneous TID PC & HS   insulin glargine-yfgn  12 Units Subcutaneous Daily   nicotine  21 mg Transdermal Daily   mouth rinse  15 mL Mouth Rinse 4 times per day   rosuvastatin  20 mg Oral Daily   sodium chloride flush  3 mL Intravenous Once   Continuous Infusions:    PRN Meds:.acetaminophen **OR** acetaminophen (TYLENOL) oral liquid 160 mg/5 mL **OR** acetaminophen, food thickener, mouth rinse  Antibiotics  :    Anti-infectives (From admission, onward)    None        Time Spent in minutes  30   Lala Lund M.D on 09/19/2021 at 10:09 AM  To page go to [email or web address]   [facility] -  Office  [phone]  See all Orders from today for further details    Objective:   Vitals:   09/18/21 1558 09/18/21 1942 09/19/21 0100 09/19/21 0400  BP: 121/89 (!) 111/90 114/78 128/89  Pulse: 73 93 85  77  Resp: 16  14 16   Temp: 98.2 F (36.8 C) 98.5 F (36.9 C) 97.8 F (36.6 C)   TempSrc: Oral Oral Oral   SpO2:   93% 97%  Weight:      Height:        Wt Readings from Last 3 Encounters:  09/15/21 87.2 kg  08/05/21 88.5 kg  12/23/20 107 kg     Intake/Output Summary (Last 24 hours) at 09/19/2021 1009 Last data filed at 09/18/2021 1603 Gross per  24 hour  Intake --  Output 450 ml  Net -450 ml       Physical Exam  Awake Alert, he has acute on chronic right-sided weakness are much weaker as compared to his right leg, strength in the right lower extremity 4/5 upper extremity 3.5/5,  [place].AT,PERRAL Supple Neck, No JVD,   Symmetrical Chest wall movement, Good air movement bilaterally, CTAB RRR,No Gallops, Rubs or new Murmurs,  +ve B.Sounds, Abd Soft, No tenderness,   No Cyanosis, Clubbing or edema    Data Review:    CBC Recent Labs  Lab 09/14/21 0204 09/15/21 0437  WBC 10.4 12.3*  HGB 15.2 15.3  HCT 42.3 44.1  PLT 224 249  MCV 88.9 90.7  MCH 31.9 31.5  MCHC 35.9 34.7  RDW 12.4 12.7  LYMPHSABS 3.8 3.9  MONOABS 0.6 0.7  EOSABS 0.5 0.4  BASOSABS 0.1 0.1    Electrolytes Recent Labs  Lab 09/14/21 0204 09/15/21 0437 09/16/21 0511 09/17/21 0454  NA 141 141 139 139  K 3.3* 3.4* 3.6 3.6  CL 109 110 109 105  CO2 25 23 23 25   GLUCOSE 97 92 114* 105*  BUN 8 6 8 11   CREATININE 0.72 0.74 0.69 0.84  CALCIUM 9.1 8.8* 8.9 9.5  MG 1.6* 1.8  --   --     ------------------------------------------------------------------------------------------------------------------ No results for input(s): "CHOL", "HDL", "LDLCALC", "TRIG", "CHOLHDL", "LDLDIRECT" in the last 72 hours.   Lab Results  Component Value Date   HGBA1C >14 08/05/2021      Radiology Reports No results found.

## 2021-09-20 LAB — GLUCOSE, CAPILLARY
Glucose-Capillary: 100 mg/dL — ABNORMAL HIGH (ref 70–99)
Glucose-Capillary: 107 mg/dL — ABNORMAL HIGH (ref 70–99)
Glucose-Capillary: 212 mg/dL — ABNORMAL HIGH (ref 70–99)
Glucose-Capillary: 163 mg/dL — ABNORMAL HIGH (ref 70–99)

## 2021-09-20 MED ORDER — INSULIN ASPART 100 UNIT/ML IJ SOLN
3.0000 [IU] | Freq: Three times a day (TID) | INTRAMUSCULAR | Status: DC
  Administered 2021-09-20 – 2021-09-28 (×23): 3 [IU] via SUBCUTANEOUS

## 2021-09-20 NOTE — Progress Notes (Signed)
PROGRESS NOTE                                                                                                                                                                                                             Patient Demographics:    Martin Mullen, is a 41 y.o. male, DOB - 1980/10/25, VFI:[id number]  Outpatient Primary MD for the patient is [name], NP    LOS - 8  Admit date - 09/11/2021    Chief Complaint  Patient presents with   Code Stroke       Brief Narrative (HPI from H&P) -  41 y.o. 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    MRI brain was revealing for 6 mm acute ischemic nonhemorrhagic left ventral pons/midbrain infarct.  CTA head and neck with no large vessel occlusion.   Subjective:   Patient in bed, appears comfortable, denies any headache, no fever, no chest pain or pressure, no shortness of breath , no abdominal pain. No new focal weakness.  Right upper extremity strength has improved on 09/20/2021.   Assessment  & Plan :    Stroke   - 6 mm acute ischemic non-hemorrhagic left ventral pons/midbrain Infarct.  He has history of previous stroke, he also has history of prothrombin gene mutation, history of noncompliance with medications.  He does seem to have an acute stroke now, confirms that he was not taking any medications for 3 weeks prior to this hospital admission, counseled on compliance, currently on combination of aspirin and Eliquis, Crestor and insulin regimen also resumed.  A1c is greater than 14 LDL above goal. Echo stable full stroke work-up done.  Await placement to SNF/CIR.  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 - Patient has been evaluated by  hematology in the past.  They advise keeping on Eliquis indefinitely.  HTN (hypertension)  Allowing for permissive hypertension  STEMI s/p RCA stent 08/2012 - Asymptomatic from a cardiac standpoint.  Continue Eliquis and aspirin combination along with statin for secondary prevention, ARB held to allow for permissive hypertension..  Dehydration with hypokalemia.  Potassium replaced, continue as needed hydration as needed with IV fluids.  Type 2 diabetes mellitus  with hyperlipidemia - poor control in the outpatient setting as evident by the extremely high A1c, he was not taking any medications for 3 weeks, counseled on compliance, placed on long-acting insulin and sliding scale.  Monitor and adjust  CBG (last 3)  Recent Labs    09/19/21 1637 09/19/21 2031 09/20/21 0812  GLUCAP 249* 298* 100*   Lab Results  Component Value Date   HGBA1C >14 08/05/2021         Condition - Extremely Guarded  Family Communication  :  friend Wyatt Mage - [phone]  - on 09/12/21, updated again on 09/18/2021 she is now the POA.  Code Status :  Full  Consults  :  Neuro  PUD Prophylaxis : Eliquis   Procedures  :     MRI - 1. 6 mm acute ischemic nonhemorrhagic left ventral pons/midbrain infarct. 2. Underlying chronic microvascular ischemic disease with multiple remote lacunar infarcts involving the right corona radiata, basal ganglia, thalami, and pons.  CTA - 1. No acute intracranial abnormality or significant interval change. 2. Stable atrophy and diffuse white matter disease. 3. Remote lacunar infarcts involving the internal capsule bilaterally and left thalamus. 4. Aspects 10/10   TTE - 1. Left ventricular ejection fraction, by estimation, is 60 to 65%. The left ventricle has normal function. The left ventricle has no regional wall motion abnormalities. Left ventricular diastolic parameters were normal.  2. Right ventricular systolic function is normal. The right ventricular size is normal.  3. The  mitral valve is normal in structure. No evidence of mitral valve regurgitation.  4. The aortic valve was not well visualized. Aortic valve regurgitation is not visualized.  5. IVC not well visualized.       Disposition Plan  :    Status is: Observation  DVT Prophylaxis  :    Place TED hose Start: 09/16/21 1807 apixaban (ELIQUIS) tablet 5 mg   Lab Results  Component Value Date   PLT 249 09/15/2021    Diet :  Diet Order             DIET - DYS 1 Room service appropriate? No; Fluid consistency: Nectar Thick  Diet effective now                    Inpatient Medications  Scheduled Meds:   stroke: early stages of recovery book   Does not apply Once   apixaban  5 mg Oral BID   aspirin EC  81 mg Oral Daily   buPROPion  150 mg Oral Daily   insulin aspart  0-20 Units Subcutaneous TID PC & HS   insulin aspart  3 Units Subcutaneous TID WC   insulin glargine-yfgn  12 Units Subcutaneous Daily   nicotine  21 mg Transdermal Daily   mouth rinse  15 mL Mouth Rinse 4 times per day   rosuvastatin  20 mg Oral Daily   sodium chloride flush  3 mL Intravenous Once   Continuous Infusions:    PRN Meds:.acetaminophen **OR** acetaminophen (TYLENOL) oral liquid 160 mg/5 mL **OR** acetaminophen, food thickener, mouth rinse  Antibiotics  :    Anti-infectives (From admission, onward)    None        Time Spent in minutes  30   Susa Raring M.D on 09/20/2021 at 9:55 AM  To page go to [email or web address]   [facility] -  Office  [phone]  See all Orders from today for further details    Objective:   Vitals:  09/19/21 1932 09/20/21 0000 09/20/21 0406 09/20/21 0809  BP: 121/79 (!) 95/59 104/76 121/76  Pulse: 87 88 73 95  Resp:  14  16  Temp: 98.3 F (36.8 C) 98.2 F (36.8 C) 97.9 F (36.6 C) (!) 97.4 F (36.3 C)  TempSrc: Oral Oral Oral Oral  SpO2: 97%   99%  Weight:      Height:        Wt Readings from Last 3 Encounters:  09/15/21 87.2 kg  08/05/21 88.5 kg   12/23/20 107 kg     Intake/Output Summary (Last 24 hours) at 09/20/2021 0955 Last data filed at 09/19/2021 2000 Gross per 24 hour  Intake --  Output 500 ml  Net -500 ml       Physical Exam  Awake Alert, he has acute on chronic right-sided weakness are much weaker as compared to his right leg, strength in the right lower extremity 4/5 upper extremity 4/5,  [place].AT,PERRAL Supple Neck, No JVD,   Symmetrical Chest wall movement, Good air movement bilaterally, CTAB RRR,No Gallops, Rubs or new Murmurs,  +ve B.Sounds, Abd Soft, No tenderness,   No Cyanosis, Clubbing or edema     Data Review:    CBC Recent Labs  Lab 09/14/21 0204 09/15/21 0437  WBC 10.4 12.3*  HGB 15.2 15.3  HCT 42.3 44.1  PLT 224 249  MCV 88.9 90.7  MCH 31.9 31.5  MCHC 35.9 34.7  RDW 12.4 12.7  LYMPHSABS 3.8 3.9  MONOABS 0.6 0.7  EOSABS 0.5 0.4  BASOSABS 0.1 0.1    Electrolytes Recent Labs  Lab 09/14/21 0204 09/15/21 0437 09/16/21 0511 09/17/21 0454  NA 141 141 139 139  K 3.3* 3.4* 3.6 3.6  CL 109 110 109 105  CO2 25 23 23 25   GLUCOSE 97 92 114* 105*  BUN 8 6 8 11   CREATININE 0.72 0.74 0.69 0.84  CALCIUM 9.1 8.8* 8.9 9.5  MG 1.6* 1.8  --   --     ------------------------------------------------------------------------------------------------------------------ No results for input(s): "CHOL", "HDL", "LDLCALC", "TRIG", "CHOLHDL", "LDLDIRECT" in the last 72 hours.   Lab Results  Component Value Date   HGBA1C >14 08/05/2021      Radiology Reports No results found.

## 2021-09-20 NOTE — Progress Notes (Signed)
Physical Therapy Treatment Patient Details Name: Martin Mullen MRN: [id number] DOB: 1980-11-17 Today's Date: 09/20/2021   History of Present Illness Pt is a 41 y/o M presenting to ED on 7/29 with slurred speech, MRI revealing 27mm acute inschemic non-hemorrhagic L ventral pons/midbrain infarct. CTA head and neck with no large vessel occlusion. PMH significant for STEMI s/p RCA stent 2014, HTN, recurrent strokes, insulin-dependent DM2, hyperlipidemia, substance use.    PT Comments    Pt received in recliner, agreeable to therapy session and requesting to attempt longer gait trial. Pt able to perform household distance gait trial with RW support and up to [facility] for stability. Pt with RLE fatigue as distance progressed but able to return to recliner in room per his request without significant buckling and defers seated break at 1/4 and halfway points of distance. Pt able to stand with only RUE support multiple reps from recliner and with improving RUE use, he still needs reminders occasionally for RUE/RLE activation. Pt continues to benefit from PT services to progress toward functional mobility goals.   Recommendations for follow up therapy are one component of a multi-disciplinary discharge planning process, led by the attending physician.  Recommendations may be updated based on patient status, additional functional criteria and insurance authorization.  Follow Up Recommendations  Acute inpatient rehab (3hours/day)     Assistance Recommended at Discharge Frequent or constant Supervision/Assistance  Patient can return home with the following A lot of help with walking and/or transfers;A lot of help with bathing/dressing/bathroom;Assistance with cooking/housework;Direct supervision/assist for medications management;Direct supervision/assist for financial management;Assist for transportation;Help with stairs or ramp for entrance   Equipment Recommendations  Other (comment) (TBD)    Recommendations  for Other Services       Precautions / Restrictions Precautions Precautions: Fall Precaution Comments: tendency to R knee buckle, asymptomatic orthostatic hypotension Restrictions Weight Bearing Restrictions: No     Mobility  Bed Mobility               General bed mobility comments: pt received in recliner    Transfers Overall transfer level: Needs assistance Equipment used: Rolling walker (2 wheels) Transfers: Sit to/from Stand Sit to Stand: Min assist           General transfer comment: from recliner x10 reps in a row, pt able to stand with RUE only after reciprocal practice    Ambulation/Gait Ambulation/Gait assistance: Mod assist Gait Distance (Feet): 60 Feet Assistive device: Rolling walker (2 wheels) Gait Pattern/deviations: Step-through pattern, Decreased step length - right, Decreased stride length, Knee hyperextension - right, Knee hyperextension - left, Decreased weight shift to right, Decreased dorsiflexion - right, Narrow base of support       General Gait Details: cues for wider steps and for sequencing with forward and backward steps; assist to control RW and for balance (especially when stepping backwards/turning). Pt given multiple prompts to sit as distance progressed if needed but denied need to sit and able to verbalize fatigue level when prompted this date. Increased assist needed toward end (up to [facility]) due to increased R lean/RLE fatigue.   Stairs             Wheelchair Mobility    Modified Rankin (Stroke Patients Only) Modified Rankin (Stroke Patients Only) Pre-Morbid Rankin Score: No symptoms Modified Rankin: Moderately severe disability     Balance Overall balance assessment: Needs assistance Sitting-balance support: No upper extremity supported, Feet supported Sitting balance-Leahy Scale: Fair Sitting balance - Comments: R lateral lean   Standing  balance support: Bilateral upper extremity supported, Reliant on assistive  device for balance Standing balance-Leahy Scale: Poor Standing balance comment: reliant on external support.                            Cognition Arousal/Alertness: Awake/alert Behavior During Therapy: Flat affect Overall Cognitive Status: Impaired/Different from baseline Area of Impairment: Attention, Following commands, Safety/judgement, Awareness, Problem solving                   Current Attention Level: Sustained   Following Commands: Follows one step commands with increased time Safety/Judgement: Decreased awareness of safety Awareness: Intellectual Problem Solving: Slow processing, Difficulty sequencing, Decreased initiation, Requires verbal cues General Comments: Able to follow all commands. Slower processing and slow verbal responses. Pt appreciative of therapist putting on country music on RN computer at end of session.        Exercises Other Exercises Other Exercises: STS x 10 reps reciprocal (5 using BUE support, 5 using only RUE support)    General Comments General comments (skin integrity, edema, etc.): BP 111/91 standing, HR 113 bpm; BP 127/88 seated post-exertion, HR 86 bpm. SpO2 WFL      Pertinent Vitals/Pain Pain Assessment Pain Assessment: No/denies pain           PT Goals (current goals can now be found in the care plan section) Acute Rehab PT Goals Patient Stated Goal: to get home and play pool PT Goal Formulation: With patient Time For Goal Achievement: 09/27/21 Progress towards PT goals: Progressing toward goals    Frequency    Min 4X/week      PT Plan Current plan remains appropriate       AM-PAC PT "6 Clicks" Mobility   Outcome Measure  Help needed turning from your back to your side while in a flat bed without using bedrails?: A Little Help needed moving from lying on your back to sitting on the side of a flat bed without using bedrails?: A Lot Help needed moving to and from a bed to a chair (including a  wheelchair)?: A Lot Help needed standing up from a chair using your arms (e.g., wheelchair or bedside chair)?: A Little Help needed to walk in hospital room?: A Lot Help needed climbing 3-5 steps with a railing? : Total 6 Click Score: 13    End of Session Equipment Utilized During Treatment: Gait belt Activity Tolerance: Patient tolerated treatment well Patient left: in chair;with call bell/phone within reach;with chair alarm set Nurse Communication: Mobility status;Other (comment) (+1 assist for pivotal transfers) PT Visit Diagnosis: Other abnormalities of gait and mobility (R26.89);Other symptoms and signs involving the nervous system (R29.898)     Time: 4656-8127 PT Time Calculation (min) (ACUTE ONLY): 23 min  Charges:  $Gait Training: 8-22 mins $Therapeutic Exercise: 8-22 mins                     Dalana Pfahler P., PTA Acute Rehabilitation Services Secure Chat Preferred 9a-5:30pm Office: [phone]    Dorathy Kinsman [facility] 09/20/2021, 2:20 PM

## 2021-09-20 NOTE — Progress Notes (Signed)
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 CTA head and neck with no large vessel occlusion. PMH significant for STEMI s/p RCA stent 2014, HTN, recurrent strokes, insulin-dependent DM2, hyperlipidemia, substance use.   OT comments  Pt progressing towards goals, pt mod A for bed mobility, able to complete standing ADL and transfers with min A, pt with R lateral lean in sitting in standing. Continues to present with weakness and impaired coordination in RUE, pt reporting difficulty holding utensils with eating, provided built up handle. Pt presenting with impairments listed below,w ill follow acutely. Continue to recommend AIR at d/c.   Recommendations for follow up therapy are one component of a multi-disciplinary discharge planning process, led by the attending physician.  Recommendations may be updated based on patient status, additional functional criteria and insurance authorization.    Follow Up Recommendations  Acute inpatient rehab (3hours/day)    Assistance Recommended at Discharge Frequent or constant Supervision/Assistance  Patient can return home with the following  A lot of help with walking and/or transfers;A lot of help with bathing/dressing/bathroom;Assistance with cooking/housework;Direct supervision/assist for medications management;Direct supervision/assist for financial management;Assist for transportation;Help with stairs or ramp for entrance;Assistance with feeding   Equipment Recommendations  Other (comment);BSC/3in1;Wheelchair (measurements OT);Wheelchair cushion (measurements OT) (RW)    Recommendations for Other Services Rehab consult;PT consult    Precautions / Restrictions Precautions Precautions:  Fall Precaution Comments: tendency to R knee buckle, asymptomatic orthostatic hypotension Restrictions Weight Bearing Restrictions: No       Mobility Bed Mobility Overal bed mobility: Needs Assistance Bed Mobility: Sidelying to Sit, Rolling   Sidelying to sit: Mod assist       General bed mobility comments: cues to scoot hips forward and mod A for trunk elevation    Transfers Overall transfer level: Needs assistance Equipment used: Rolling walker (2 wheels) Transfers: Sit to/from Stand Sit to Stand: Min assist                 Balance Overall balance assessment: Needs assistance Sitting-balance support: No upper extremity supported, Feet supported Sitting balance-Leahy Scale: Fair Sitting balance - Comments: R lateral lean   Standing balance support: Bilateral upper extremity supported, Reliant on assistive device for balance Standing balance-Leahy Scale: Poor Standing balance comment: reliant on external support.                           ADL either performed or assessed with clinical judgement   ADL Overall ADL's : Needs assistance/impaired     Grooming: Minimal assistance;Standing Grooming Details (indicate cue type and reason): washes face and combs hair standing                 Toilet Transfer: Minimal assistance;+2 for safety/equipment;Rolling walker (2 wheels);Ambulation;Regular Teacher, adult education Details (indicate cue type and reason): simulated in room         Functional mobility during ADLs: Minimal assistance;Rolling walker (2 wheels)      Extremity/Trunk Assessment Upper Extremity Assessment Upper Extremity Assessment: Generalized weakness RUE Deficits / Details: ~30* shoulder AROM, 3/5 ROM/strength in elbow/wrist/hand, noted decreased use of RUE compared to LUE, supports RUE with LUE during grooming task RUE Coordination: decreased fine motor;decreased gross motor LUE Deficits / Details: ~30* shoulder AROM LUE  Coordination: decreased gross motor   Lower Extremity Assessment Lower Extremity Assessment: Defer to PT evaluation        Vision   Vision Assessment?: Yes Eye Alignment: Within Functional Limits Ocular Range of Motion: Impaired-to be further tested in functional context Tracking/Visual Pursuits: Decreased smoothness of horizontal tracking;Decreased smoothness of eye movement to RIGHT superior field;Decreased smoothness of eye movement to RIGHT inferior field Saccades: Additional eye shifts occurred during testing Depth Perception: Undershoots Additional Comments: central gaze, delayed tracking   Perception Perception Perception: Impaired (mild R inattention)   Praxis Praxis Praxis: Not tested    Cognition Arousal/Alertness: Awake/alert Behavior During Therapy: Flat affect Overall Cognitive Status: Impaired/Different from baseline Area of Impairment: Attention, Following commands, Safety/judgement, Awareness, Problem solving                   Current Attention Level: Sustained   Following Commands: Follows one step commands with increased time Safety/Judgement: Decreased awareness of safety Awareness: Intellectual Problem Solving: Slow processing, Difficulty sequencing, Decreased initiation, Requires verbal cues General Comments: Able to follow all commands. Slower processing and slow verbal responses.        Exercises      Shoulder Instructions       General Comments VSS on RA    Pertinent Vitals/ Pain       Pain Assessment Pain Assessment: No/denies pain  Home Living                                          Prior Functioning/Environment              Frequency  Min 3X/week        Progress Toward Goals  OT Goals(current goals can now be found in the care plan section)  Progress towards OT goals: Progressing toward goals  Acute Rehab OT Goals Patient Stated Goal: none stated OT Goal Formulation: With patient Time For  Goal Achievement: 09/26/21 Potential to Achieve Goals: Good ADL Goals Pt Will Perform Upper Body Dressing: with min assist;sitting Pt Will Perform Lower Body Dressing: with min assist;sitting/lateral leans;sit to/from stand Pt Will Transfer to Toilet: with min assist;regular height toilet;ambulating Pt Will Perform Tub/Shower Transfer: Tub transfer;Shower transfer;rolling walker;ambulating Additional ADL Goal #1: pt will perform 3 step trailmaking task in prep for ADLs  Plan Discharge plan remains appropriate;Frequency needs to be updated    Co-evaluation                 AM-PAC OT "6 Clicks" Daily Activity     Outcome Measure   Help from another person eating meals?: A Little Help from another person taking care of personal grooming?: A Little Help from another person toileting, which includes using toliet, bedpan, or urinal?: A Lot Help from another person bathing (including washing, rinsing, drying)?: A Lot Help from another person to put on and taking off regular upper body clothing?: A Lot Help from another person to put on and taking off regular lower body clothing?: A Lot 6 Click Score: 14    End of Session Equipment Utilized During Treatment: Gait belt;Rolling walker (2 wheels)  OT Visit Diagnosis: Unsteadiness on feet (R26.81);Other abnormalities of gait and mobility (R26.89);Muscle weakness (generalized) (M62.81);Other symptoms and signs involving cognitive function;Cognitive communication deficit (R41.841) Symptoms and signs involving cognitive functions: Cerebral infarction   Activity Tolerance Patient tolerated treatment well   Patient Left in chair;with call  bell/phone within reach;with chair alarm set;with nursing/sitter in room   Nurse Communication Mobility status        Time: 3212-2482 OT Time Calculation (min): 28 min  Charges: OT General Charges $OT Visit: 1 Visit OT Treatments $Self Care/Home Management : 23-37 mins  Alfonzo Beers, OTD,  OTR/L Acute Rehab [phone]   Mayer Masker 09/20/2021, 12:54 PM

## 2021-09-21 LAB — CBC
HCT: 43.1 % (ref 39.0–52.0)
Hemoglobin: 15.2 g/dL (ref 13.0–17.0)
MCH: 32.1 pg (ref 26.0–34.0)
MCHC: 35.3 g/dL (ref 30.0–36.0)
MCV: 91.1 fL (ref 80.0–100.0)
Platelets: 253 10*3/uL (ref 150–400)
RBC: 4.73 MIL/uL (ref 4.22–5.81)
RDW: 12.5 % (ref 11.5–15.5)
WBC: 10.5 10*3/uL (ref 4.0–10.5)
nRBC: 0 % (ref 0.0–0.2)

## 2021-09-21 LAB — BASIC METABOLIC PANEL
Anion gap: 9 (ref 5–15)
BUN: 16 mg/dL (ref 6–20)
CO2: 24 mmol/L (ref 22–32)
Calcium: 9.2 mg/dL (ref 8.9–10.3)
Chloride: 107 mmol/L (ref 98–111)
Creatinine, Ser: 0.87 mg/dL (ref 0.61–1.24)
GFR, Estimated: >60 mL/min (ref >60)
Glucose, Bld: 108 mg/dL — ABNORMAL HIGH (ref 70–99)
Potassium: 3.8 mmol/L (ref 3.5–5.1)
Sodium: 140 mmol/L (ref 135–145)

## 2021-09-21 LAB — GLUCOSE, CAPILLARY
Glucose-Capillary: 113 mg/dL — ABNORMAL HIGH (ref 70–99)
Glucose-Capillary: 187 mg/dL — ABNORMAL HIGH (ref 70–99)
Glucose-Capillary: 200 mg/dL — ABNORMAL HIGH (ref 70–99)
Glucose-Capillary: 111 mg/dL — ABNORMAL HIGH (ref 70–99)

## 2021-09-21 LAB — MAGNESIUM: Magnesium: 1.7 mg/dL (ref 1.7–2.4)

## 2021-09-21 MED ORDER — MAGNESIUM SULFATE 2 GM/50ML IV SOLN
2.0000 g | Freq: Once | INTRAVENOUS | Status: AC
  Administered 2021-09-21: 2 g via INTRAVENOUS
  Filled 2021-09-21: qty 50

## 2021-09-21 NOTE — Progress Notes (Signed)
Speech Language Pathology Treatment: Dysphagia  Patient Details Name: Martin Mullen MRN: [id number] DOB: August 25, 1980 Today's Date: 09/21/2021 Time: 5809-9833 SLP Time Calculation (min) (ACUTE ONLY): 13 min  Assessment / Plan / Recommendation Clinical Impression  Patient seen by SLP for skilled treatment session focused on dysphagia goals.. Patient was sitting in recliner and feeding himself lunch tray when SLP arrived. Self-feeding is slow due to patient's deficits but he is able to feed self without direct assistance. During PO consumption, he was noted to have anterior spillage more so on the right side of mouth, which he did not seem to notice/sense. He had a couple hiccups and after SLP instructed him to drink some of his liquids, hiccups ceased. He did exhibit delayed cough and throat clear and suspect this is from likely pharyngeal residuals. He would not independently drink liquids. Patient was not interactive with SLP at all but did respond to cues to take a sip of liquids,etc. SLP recommending continue with Dys 1 (puree) solids and nectar thick liquids. SLP will continue to follow.    HPI HPI: Patient is a 41 y.o. male with PMH: recurrent strokes, HTN, STEMI s/p RCA stent 2014, DM-2 insulin dependent, HLD, cocaine use. He presented to the hospital on 09/11/21 with slurred speech. MRI brain revealed 66mm acute ischemic nonhemmorhagic left ventral pons/midbrain infarct; CTA head and neck with no large vessel occlusion.      SLP Plan  Continue with current plan of care      Recommendations for follow up therapy are one component of a multi-disciplinary discharge planning process, led by the attending physician.  Recommendations may be updated based on patient status, additional functional criteria and insurance authorization.    Recommendations  Diet recommendations: Dysphagia 1 (puree);Nectar-thick liquid Liquids provided via: Cup;No straw Medication Administration: Crushed with  puree Supervision: Staff to assist with self feeding;Patient able to self feed Compensations: Slow rate;Small sips/bites;Minimize environmental distractions Postural Changes and/or Swallow Maneuvers: Seated upright 90 degrees;Upright 30-60 min after meal                Oral Care Recommendations: Oral care BID;Staff/trained caregiver to provide oral care Follow Up Recommendations: Follow physician's recommendations for discharge plan and follow up therapies Assistance recommended at discharge: Frequent or constant Supervision/Assistance SLP Visit Diagnosis: Dysphagia, oropharyngeal phase (R13.12) Plan: Continue with current plan of care           Angela Nevin, MA, CCC-SLP Speech Therapy

## 2021-09-21 NOTE — TOC Progression Note (Signed)
Transition of Care [facility]) - Progression Note    Patient Details  Name: RIDDIK SENNA MRN: [id number] Date of Birth: 03/03/1980  [facility] CM/SW Contact  Mearl Latin, LCSW Phone Number: 09/21/2021, 2:14 PM  Clinical Narrative:    CSW spoke with patient's ex-girlfriend, [name]. She and patient have two twins (about 41 years old) together but they are not together. She stated she is still wanting the best for patient and is helping him as she can, though she cannot provide full time caregiving due to caring for the children. She stated patient is requesting her be his HCPOA because he is concerned his brother will not fight for him to stay alive if needed. 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 She is on the way to drop off a Medicaid application for him at DSS to see if he can qualify for help as she is hoping to get him moved out of their long term friend's place (she has concerns about his health as well). CSW discussed case in QC meeting with leadership.    Expected Discharge Plan: Skilled Nursing Facility Barriers to Discharge: Inadequate or no insurance, SNF Pending payor source - LOG, SNF Pending bed offer  Expected Discharge Plan and Services Expected Discharge Plan: Skilled Nursing Facility In-house Referral: Clinical Social Work   Post Acute Care Choice: Skilled Nursing Facility Living arrangements for the past 2 months: Mobile Home                                       Social Determinants of Health (SDOH) Interventions    Readmission Risk Interventions     No data to display

## 2021-09-21 NOTE — Progress Notes (Signed)
Physical Therapy Treatment Patient Details Name: Martin Mullen MRN: [id number] DOB: 11-23-1980 Today's Date: 09/21/2021   History of Present Illness Pt is a 41 y/o M presenting to ED on 7/29 with slurred speech, MRI revealing 37mm acute inschemic non-hemorrhagic L ventral pons/midbrain infarct. CTA head and neck with no large vessel occlusion. PMH significant for STEMI s/p RCA stent 2014, HTN, recurrent strokes, insulin-dependent DM2, hyperlipidemia, substance use.    PT Comments    Pt received in supine, agreeable to therapy session and eager to progress gait training, pt friend Wyatt Mage (mother of his kids and ex-partner) present in room during session and pt agreeable to have her remain during session. Pt performed gait with chair follow for safety and tolerates standing for longer than previous session, BP stable when checked once seated in recliner pre/post mobility, TED hose still donned. Pt able to achieve short household distances prior to gait quality decreasing and needs consistent modA +2 for safety due to RLE weakness, scissoring steps and decreased R sided awareness. Pt continues to benefit from PT services to progress toward functional mobility goals.   Recommendations for follow up therapy are one component of a multi-disciplinary discharge planning process, led by the attending physician.  Recommendations may be updated based on patient status, additional functional criteria and insurance authorization.  Follow Up Recommendations  Acute inpatient rehab (3hours/day)     Assistance Recommended at Discharge Frequent or constant Supervision/Assistance  Patient can return home with the following A lot of help with walking and/or transfers;A lot of help with bathing/dressing/bathroom;Assistance with cooking/housework;Direct supervision/assist for medications management;Direct supervision/assist for financial management;Assist for transportation;Help with stairs or ramp for entrance    Equipment Recommendations  Other (comment) (TBD)    Recommendations for Other Services       Precautions / Restrictions Precautions Precautions: Fall Precaution Comments: tendency to R knee buckle and asymptomatic orthostatic hypotension Restrictions Weight Bearing Restrictions: No     Mobility  Bed Mobility Overal bed mobility: Needs Assistance Bed Mobility: Rolling, Sidelying to Sit Rolling: Modified independent (Device/Increase time) Sidelying to sit: Min assist       General bed mobility comments: increased time/effort but good initiation today    Transfers Overall transfer level: Needs assistance Equipment used: Rolling walker (2 wheels) Transfers: Sit to/from Stand Sit to Stand: Min assist, Mod assist           General transfer comment: modA initially to stand from EOB (pt attempted with min guard but unable to elevate hips enough and sat  back down), then from recliner with minA to rise and min/modA steadying    Ambulation/Gait Ambulation/Gait assistance: Mod assist, +2 safety/equipment Gait Distance (Feet): 75 Feet (x3 trials with seated breaks between) Assistive device: Rolling walker (2 wheels) Gait Pattern/deviations: Step-through pattern, Decreased step length - right, Decreased stride length, Knee hyperextension - right, Knee hyperextension - left, Decreased weight shift to right, Decreased dorsiflexion - right, Narrow base of support, Scissoring       General Gait Details: cues for wider steps and safer step sequencing, pt needing intermittent assist to control RW and for balance (especially when stepping backwards/turning). Pt with increased instability/RLE buckling with fatigue and needed cues for activity pacing/seated breaks PRN. x2 LOB toward R side with scissoring steps (at beginning of each trial) needing RW and modA to correct. Close chair follow for safety provided by pt friend [place].   Stairs             Psychologist, prison and probation services  Modified Rankin (Stroke Patients Only) Modified Rankin (Stroke Patients Only) Pre-Morbid Rankin Score: No symptoms Modified Rankin: Moderately severe disability     Balance Overall balance assessment: Needs assistance Sitting-balance support: No upper extremity supported, Feet supported Sitting balance-Leahy Scale: Fair Sitting balance - Comments: R lateral lean   Standing balance support: Bilateral upper extremity supported, Reliant on assistive device for balance Standing balance-Leahy Scale: Poor Standing balance comment: reliant on external support, multiple R lateral LOB and scissoring steps despite RW support.                            Cognition Arousal/Alertness: Awake/alert Behavior During Therapy: Flat affect Overall Cognitive Status: Impaired/Different from baseline Area of Impairment: Attention, Following commands, Safety/judgement, Awareness, Problem solving                   Current Attention Level: Sustained   Following Commands: Follows one step commands with increased time Safety/Judgement: Decreased awareness of safety Awareness: Intellectual Problem Solving: Slow processing, Difficulty sequencing, Decreased initiation, Requires verbal cues General Comments: Able to follow all commands. Slower processing and slow verbal responses. Pt requesting for therapist to put on country music on RN computer at end of session. Pt friend/ex-partner Wyatt Mage (mother of his children) in room during session and helping to provide chair follow. She asked to speak with case mgmt and neurology about his progress because she helps to coordinate his appointments and neurology care prior to this admission, case mgmt/SW notified via Secure Chat. Pt and [name] both were agreeable for her to be contacted by phone if MD/clinical staff unavailable while she was here.        Exercises Other Exercises Other Exercises: seated BLE AROM: hip flexion, LAQ, ankle pumps x10  reps ea    General Comments        Pertinent Vitals/Pain  BP 139/94 (106) supine prior to OOB and BP 125/95 (106 ) reclined in chair after ambulation. HR 89-90's bpm     PT Goals (current goals can now be found in the care plan section) Acute Rehab PT Goals Patient Stated Goal: to get home and see my kids PT Goal Formulation: With patient Time For Goal Achievement: 09/26/21 (date corrected from PT previous error as July had 31 days) Progress towards PT goals: Progressing toward goals    Frequency    Min 4X/week      PT Plan Current plan remains appropriate       AM-PAC PT "6 Clicks" Mobility   Outcome Measure  Help needed turning from your back to your side while in a flat bed without using bedrails?: A Little Help needed moving from lying on your back to sitting on the side of a flat bed without using bedrails?: A Lot (mod safety cues) Help needed moving to and from a bed to a chair (including a wheelchair)?: A Lot Help needed standing up from a chair using your arms (e.g., wheelchair or bedside chair)?: A Lot Help needed to walk in hospital room?: A Lot (chair follow) Help needed climbing 3-5 steps with a railing? : Total 6 Click Score: 12    End of Session Equipment Utilized During Treatment: Gait belt Activity Tolerance: Patient tolerated treatment well Patient left: in chair;with call bell/phone within reach;with chair alarm set;with family/visitor present (friend Wyatt Mage (who assists him with medical appointments/communications) present in room) Nurse Communication: Mobility status;Other (comment) (+1-2 assist for pivotal transfers, needs +2 for longer distance ambulation)  PT Visit Diagnosis: Other abnormalities of gait and mobility (R26.89);Other symptoms and signs involving the nervous system (R29.898)     Time: 0938-1829 PT Time Calculation (min) (ACUTE ONLY): 33 min  Charges:  $Gait Training: 8-22 mins $Therapeutic Activity: 8-22 mins                      Karryn Kosinski P., PTA Acute Rehabilitation Services Secure Chat Preferred 9a-5:30pm Office: [phone]    Dorathy Kinsman [facility] [address] 09/21/2021, 4:08 PM

## 2021-09-21 NOTE — Progress Notes (Signed)
PROGRESS NOTE                                                                                                                                                                                                             Patient Demographics:    Martin Mullen, is a 41 y.o. male, DOB - 1980/03/12, QIW:[id number]  Outpatient Primary MD for the patient is [name], NP    LOS - 9  Admit date - 09/11/2021    Chief Complaint  Patient presents with   Code Stroke       Brief Narrative (HPI from H&P) -  41 y.o. 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    MRI brain was revealing for 6 mm acute ischemic nonhemorrhagic left ventral pons/midbrain infarct.  CTA head and neck with no large vessel occlusion.   Subjective:   Patient in bed, appears comfortable, denies any headache, no fever, no chest pain or pressure, no shortness of breath , no abdominal pain. No new focal weakness. Right upper extremity strength has started to improved since 09/20/2021.   Assessment  & Plan :    Stroke due to noncompliance with his medications for several weeks   - 6 mm acute ischemic non-hemorrhagic left ventral pons/midbrain Infarct.  He has history of previous stroke, he also has history of prothrombin gene mutation, history of noncompliance with medications.  He does seem to have an acute stroke now, confirms that he was not taking any medications for 3 weeks prior to this hospital admission, counseled on compliance, currently on combination of aspirin and Eliquis, Crestor and insulin regimen also resumed.  A1c is greater than 14 LDL above goal. Echo stable full stroke work-up done.  Await placement to SNF/CIR.  Heterozygous  for prothrombin G20210A mutation - Patient has been evaluated by hematology in the past.  They advise keeping on Eliquis indefinitely.  HTN (hypertension) stable off of medications.  STEMI s/p RCA stent 08/2012 - Asymptomatic from a cardiac standpoint.  Continue Eliquis and aspirin combination along with statin for secondary prevention, ARB held to allow for permissive hypertension..  Dehydration with hypokalemia.  Potassium replaced, continue as needed hydration as  needed with IV fluids.  Type 2 diabetes mellitus with hyperlipidemia - poor control in the outpatient setting as evident by the extremely high A1c, he was not taking any medications for 3 weeks, counseled on compliance, placed on long-acting insulin and sliding scale.  Monitor and adjust  CBG (last 3)  Recent Labs    09/20/21 1542 09/20/21 2132 09/21/21 0821  GLUCAP 212* 163* 113*   Lab Results  Component Value Date   HGBA1C >14 08/05/2021         Condition - Extremely Guarded  Family Communication  :  friend Wyatt Mage - [phone]  - on 09/12/21, updated again on 09/18/2021 she is now the POA.  Code Status :  Full  Consults  :  Neuro  PUD Prophylaxis : Eliquis   Procedures  :     MRI - 1. 6 mm acute ischemic nonhemorrhagic left ventral pons/midbrain infarct. 2. Underlying chronic microvascular ischemic disease with multiple remote lacunar infarcts involving the right corona radiata, basal ganglia, thalami, and pons.  CTA - 1. No acute intracranial abnormality or significant interval change. 2. Stable atrophy and diffuse white matter disease. 3. Remote lacunar infarcts involving the internal capsule bilaterally and left thalamus. 4. Aspects 10/10   TTE - 1. Left ventricular ejection fraction, by estimation, is 60 to 65%. The left ventricle has normal function. The left ventricle has no regional wall motion abnormalities. Left ventricular diastolic parameters were normal.  2. Right ventricular systolic function is  normal. The right ventricular size is normal.  3. The mitral valve is normal in structure. No evidence of mitral valve regurgitation.  4. The aortic valve was not well visualized. Aortic valve regurgitation is not visualized.  5. IVC not well visualized.       Disposition Plan  :    Status is: Inpatient  DVT Prophylaxis  :    Place TED hose Start: 09/16/21 1807 apixaban (ELIQUIS) tablet 5 mg   Lab Results  Component Value Date   PLT 253 09/21/2021    Diet :  Diet Order             DIET - DYS 1 Room service appropriate? No; Fluid consistency: Nectar Thick  Diet effective now                    Inpatient Medications  Scheduled Meds:   stroke: early stages of recovery book   Does not apply Once   apixaban  5 mg Oral BID   aspirin EC  81 mg Oral Daily   buPROPion  150 mg Oral Daily   insulin aspart  0-20 Units Subcutaneous TID PC & HS   insulin aspart  3 Units Subcutaneous TID WC   insulin glargine-yfgn  12 Units Subcutaneous Daily   nicotine  21 mg Transdermal Daily   mouth rinse  15 mL Mouth Rinse 4 times per day   rosuvastatin  20 mg Oral Daily   sodium chloride flush  3 mL Intravenous Once   Continuous Infusions:    PRN Meds:.acetaminophen **OR** acetaminophen (TYLENOL) oral liquid 160 mg/5 mL **OR** acetaminophen, food thickener, mouth rinse  Antibiotics  :    Anti-infectives (From admission, onward)    None        Time Spent in minutes  30   Susa Raring M.D on 09/21/2021 at 8:33 AM  To page go to [email or web address]   [facility] -  Office  [phone]  See all Orders from today for further details  Objective:   Vitals:   09/20/21 2042 09/21/21 0027 09/21/21 0400 09/21/21 0740  BP: 131/86 (!) 127/90 109/82 104/70  Pulse:   97 84  Resp:  16 16 18   Temp:  98.5 F (36.9 C) 97.7 F (36.5 C) 97.8 F (36.6 C)  TempSrc:   Oral Oral  SpO2:  96% 95%   Weight:      Height:        Wt Readings from Last 3 Encounters:  09/15/21 87.2  kg  08/05/21 88.5 kg  12/23/20 107 kg     Intake/Output Summary (Last 24 hours) at 09/21/2021 0833 Last data filed at 09/21/2021 11/21/2021 Gross per 24 hour  Intake 60 ml  Output 1000 ml  Net -940 ml   Physical Exam  Awake Alert, he has acute on chronic right-sided weakness are much weaker as compared to his right leg, strength in the right lower extremity 4/5 upper extremity 4/5 (improved) [place].AT,PERRAL Supple Neck, No JVD,   Symmetrical Chest wall movement, Good air movement bilaterally, CTAB RRR,No Gallops, Rubs or new Murmurs,  +ve B.Sounds, Abd Soft, No tenderness,   No Cyanosis, Clubbing or edema     Data Review:    CBC Recent Labs  Lab 09/15/21 0437 09/21/21 0505  WBC 12.3* 10.5  HGB 15.3 15.2  HCT 44.1 43.1  PLT 249 253  MCV 90.7 91.1  MCH 31.5 32.1  MCHC 34.7 35.3  RDW 12.7 12.5  LYMPHSABS 3.9  --   MONOABS 0.7  --   EOSABS 0.4  --   BASOSABS 0.1  --     Electrolytes Recent Labs  Lab 09/15/21 0437 09/16/21 0511 09/17/21 0454 09/21/21 0505  NA 141 139 139 140  K 3.4* 3.6 3.6 3.8  CL 110 109 105 107  CO2 23 23 25 24   GLUCOSE 92 114* 105* 108*  BUN 6 8 11 16   CREATININE 0.74 0.69 0.84 0.87  CALCIUM 8.8* 8.9 9.5 9.2  MG 1.8  --   --  1.7    ------------------------------------------------------------------------------------------------------------------ No results for input(s): "CHOL", "HDL", "LDLCALC", "TRIG", "CHOLHDL", "LDLDIRECT" in the last 72 hours.   Lab Results  Component Value Date   HGBA1C >14 08/05/2021      Radiology Reports No results found.

## 2021-09-21 NOTE — TOC Progression Note (Signed)
Transition of Care [facility]) - Progression Note    Patient Details  Name: Martin Mullen MRN: [id number] Date of Birth: March 16, 1980  [facility]) CM/SW Contact  Mearl Latin, LCSW Phone Number: 09/21/2021, 9:46 AM  Clinical Narrative:    Patient continues to have no SNF bed offers.   Expected Discharge Plan: IP Rehab Facility Barriers to Discharge: Continued Medical Work up  Expected Discharge Plan and Services Expected Discharge Plan: IP Rehab Facility                                               Social Determinants of Health (SDOH) Interventions    Readmission Risk Interventions     No data to display

## 2021-09-21 NOTE — Plan of Care (Signed)
Pt alert and oriented x 4. Slow with response. NIH 4. Due to speech and weakness in legs. Pt stood at bedside and ambulated to top of bed with walker and one assist. Bath completed and linen changed. Pt assisted with face and peri area.  Problem: Education: Goal: Ability to describe self-care measures that may prevent or decrease complications (Diabetes Survival Skills Education) will improve Outcome: Progressing Goal: Individualized Educational Video(s) Outcome: Progressing   Problem: Coping: Goal: Ability to adjust to condition or change in health will improve Outcome: Progressing   Problem: Fluid Volume: Goal: Ability to maintain a balanced intake and output will improve Outcome: Progressing   Problem: Health Behavior/Discharge Planning: Goal: Ability to identify and utilize available resources and services will improve Outcome: Progressing Goal: Ability to manage health-related needs will improve Outcome: Progressing   Problem: Metabolic: Goal: Ability to maintain appropriate glucose levels will improve Outcome: Progressing   Problem: Nutritional: Goal: Maintenance of adequate nutrition will improve Outcome: Progressing Goal: Progress toward achieving an optimal weight will improve Outcome: Progressing   Problem: Skin Integrity: Goal: Risk for impaired skin integrity will decrease Outcome: Progressing   Problem: Tissue Perfusion: Goal: Adequacy of tissue perfusion will improve Outcome: Progressing   Problem: Education: Goal: Knowledge of General Education information will improve Description: Including pain rating scale, medication(s)/side effects and non-pharmacologic comfort measures Outcome: Progressing   Problem: Health Behavior/Discharge Planning: Goal: Ability to manage health-related needs will improve Outcome: Progressing   Problem: Clinical Measurements: Goal: Ability to maintain clinical measurements within normal limits will improve Outcome:  Progressing Goal: Will remain free from infection Outcome: Progressing Goal: Diagnostic test results will improve Outcome: Progressing Goal: Respiratory complications will improve Outcome: Progressing Goal: Cardiovascular complication will be avoided Outcome: Progressing   Problem: Activity: Goal: Risk for activity intolerance will decrease Outcome: Progressing   Problem: Nutrition: Goal: Adequate nutrition will be maintained Outcome: Progressing   Problem: Coping: Goal: Level of anxiety will decrease Outcome: Progressing   Problem: Elimination: Goal: Will not experience complications related to bowel motility Outcome: Progressing Goal: Will not experience complications related to urinary retention Outcome: Progressing   Problem: Pain Managment: Goal: General experience of comfort will improve Outcome: Progressing   Problem: Safety: Goal: Ability to remain free from injury will improve Outcome: Progressing   Problem: Skin Integrity: Goal: Risk for impaired skin integrity will decrease Outcome: Progressing   Problem: Education: Goal: Knowledge of disease or condition will improve Outcome: Progressing Goal: Knowledge of secondary prevention will improve (SELECT ALL) Outcome: Progressing Goal: Knowledge of patient specific risk factors will improve (INDIVIDUALIZE FOR PATIENT) Outcome: Progressing Goal: Individualized Educational Video(s) Outcome: Progressing   Problem: Coping: Goal: Will verbalize positive feelings about self Outcome: Progressing Goal: Will identify appropriate support needs Outcome: Progressing   Problem: Health Behavior/Discharge Planning: Goal: Ability to manage health-related needs will improve Outcome: Progressing   Problem: Self-Care: Goal: Ability to participate in self-care as condition permits will improve Outcome: Progressing Goal: Verbalization of feelings and concerns over difficulty with self-care will improve Outcome:  Progressing Goal: Ability to communicate needs accurately will improve Outcome: Progressing   Problem: Nutrition: Goal: Risk of aspiration will decrease Outcome: Progressing Goal: Dietary intake will improve Outcome: Progressing   Problem: Ischemic Stroke/TIA Tissue Perfusion: Goal: Complications of ischemic stroke/TIA will be minimized Outcome: Progressing

## 2021-09-22 LAB — GLUCOSE, CAPILLARY
Glucose-Capillary: 107 mg/dL — ABNORMAL HIGH (ref 70–99)
Glucose-Capillary: 120 mg/dL — ABNORMAL HIGH (ref 70–99)
Glucose-Capillary: 178 mg/dL — ABNORMAL HIGH (ref 70–99)
Glucose-Capillary: 193 mg/dL — ABNORMAL HIGH (ref 70–99)

## 2021-09-22 NOTE — TOC Progression Note (Signed)
Transition of Care [facility]) - Progression Note    Patient Details  Name: Martin Mullen MRN: [id number] Date of Birth: 15-Jun-1980  [facility]) CM/SW Contact  Mearl Latin, LCSW Phone Number: 09/22/2021, 1:21 PM  Clinical Narrative:    Requested [facility] consider patient for a 2 week LOG.    Expected Discharge Plan: Skilled Nursing Facility Barriers to Discharge: Inadequate or no insurance, SNF Pending payor source - LOG, SNF Pending bed offer  Expected Discharge Plan and Services Expected Discharge Plan: Skilled Nursing Facility In-house Referral: Clinical Social Work   Post Acute Care Choice: Skilled Nursing Facility Living arrangements for the past 2 months: Mobile Home                                       Social Determinants of Health (SDOH) Interventions    Readmission Risk Interventions     No data to display

## 2021-09-22 NOTE — Progress Notes (Signed)
Occupational Therapy Treatment Patient Details Name: Martin Mullen MRN: [id number] DOB: 1980/09/22 Today's Date: 09/22/2021   History of present illness Pt is a 41 y/o M presenting to ED on 7/29 with slurred speech, MRI revealing 68mm acute inschemic non-hemorrhagic L ventral pons/midbrain infarct. CTA head and neck with no large vessel occlusion. PMH significant for STEMI s/p RCA stent 2014, HTN, recurrent strokes, insulin-dependent DM2, hyperlipidemia, substance use.   OT comments  Pt remains highly motivated. Focus of session on working with red foam build up on pen so pt could sign Advanced Directives. With practice, pt able to form legible initials with R hand. Pt also able to self feed with foam build up on spoon with R hand with minimal spillage.    Recommendations for follow up therapy are one component of a multi-disciplinary discharge planning process, led by the attending physician.  Recommendations may be updated based on patient status, additional functional criteria and insurance authorization.    Follow Up Recommendations  Acute inpatient rehab (3hours/day)    Assistance Recommended at Discharge Frequent or constant Supervision/Assistance  Patient can return home with the following  A lot of help with walking and/or transfers;A lot of help with bathing/dressing/bathroom;Assistance with cooking/housework;Direct supervision/assist for medications management;Direct supervision/assist for financial management;Assist for transportation;Help with stairs or ramp for entrance;Assistance with feeding   Equipment Recommendations  Hospital bed;BSC/3in1;Wheelchair (measurements OT);Wheelchair cushion (measurements OT)    Recommendations for Other Services      Precautions / Restrictions Precautions Precautions: Fall Precaution Comments: tendency to R knee buckle and asymptomatic orthostatic hypotension       Mobility Bed Mobility                    Transfers                          Balance                                           ADL either performed or assessed with clinical judgement   ADL Overall ADL's : Needs assistance/impaired Eating/Feeding: Set up;Bed level Eating/Feeding Details (indicate cue type and reason): assist to open containers, worked on using R hand for part of meal with foam build up                                   General ADL Comments: Pt needing to sign advanced directives, built up pen with red foam and worked on [employer] and date.    Extremity/Trunk Assessment              Vision       Perception     Praxis      Cognition Arousal/Alertness: Awake/alert Behavior During Therapy: Flat affect Overall Cognitive Status: Impaired/Different from baseline Area of Impairment: Problem solving                             Problem Solving: Slow processing, Difficulty sequencing, Decreased initiation, Requires verbal cues          Exercises      Shoulder Instructions       General Comments      Pertinent Vitals/ Pain       Pain Assessment  Pain Assessment: No/denies pain  Home Living                                          Prior Functioning/Environment              Frequency  Min 3X/week        Progress Toward Goals  OT Goals(current goals can now be found in the care plan section)  Progress towards OT goals: Progressing toward goals  Acute Rehab OT Goals OT Goal Formulation: With patient Time For Goal Achievement: 09/26/21 Potential to Achieve Goals: Good  Plan Discharge plan remains appropriate;Frequency needs to be updated    Co-evaluation                 AM-PAC OT "6 Clicks" Daily Activity     Outcome Measure   Help from another person eating meals?: A Little Help from another person taking care of personal grooming?: A Little Help from another person toileting, which includes using toliet, bedpan,  or urinal?: A Lot Help from another person bathing (including washing, rinsing, drying)?: A Lot Help from another person to put on and taking off regular upper body clothing?: A Lot Help from another person to put on and taking off regular lower body clothing?: A Lot 6 Click Score: 14    End of Session    OT Visit Diagnosis: Unsteadiness on feet (R26.81);Other abnormalities of gait and mobility (R26.89);Muscle weakness (generalized) (M62.81);Other symptoms and signs involving cognitive function;Cognitive communication deficit (R41.841) Symptoms and signs involving cognitive functions: Cerebral infarction   Activity Tolerance Patient tolerated treatment well   Patient Left in bed;with call bell/phone within reach;with bed alarm set   Nurse Communication          Time: 1607-3710 OT Time Calculation (min): 34 min  Charges: OT General Charges $OT Visit: 1 Visit OT Treatments $Self Care/Home Management : 23-37 mins  [name], OTR/L Acute Rehabilitation Services Office: [phone]   [name] 09/22/2021, 2:57 PM

## 2021-09-22 NOTE — Progress Notes (Signed)
Physical Therapy Treatment Patient Details Name: Martin Mullen MRN: [id number] DOB: 1980-08-13 Today's Date: 09/22/2021   History of Present Illness Pt is a 41 y/o M presenting to ED on 7/29 with slurred speech, MRI revealing 74mm acute inschemic non-hemorrhagic L ventral pons/midbrain infarct. CTA head and neck with no large vessel occlusion. PMH significant for STEMI s/p RCA stent 2014, HTN, recurrent strokes, insulin-dependent DM2, hyperlipidemia, substance use.    PT Comments    Pt received in supine, agreeable to therapy session with encouragement, with good participation and tolerance for transfer and stair training/standing exercises at bedside. Pt agreeable to sit up in chair to finish eating dinner at end of session. Pt needing up to modA (+2 safety but only +1 physical assist) for stepping up/down from standard height platform step in room with BUE support. Pt continues to benefit from PT services to progress toward functional mobility goals.    Recommendations for follow up therapy are one component of a multi-disciplinary discharge planning process, led by the attending physician.  Recommendations may be updated based on patient status, additional functional criteria and insurance authorization.  Follow Up Recommendations  Acute inpatient rehab (3hours/day)     Assistance Recommended at Discharge Frequent or constant Supervision/Assistance  Patient can return home with the following A lot of help with walking and/or transfers;A lot of help with bathing/dressing/bathroom;Assistance with cooking/housework;Direct supervision/assist for medications management;Direct supervision/assist for financial management;Assist for transportation;Help with stairs or ramp for entrance   Equipment Recommendations  Other (comment) (TBD)    Recommendations for Other Services       Precautions / Restrictions Precautions Precautions: Fall Precaution Comments: tendency to R knee buckle and  asymptomatic orthostatic hypotension Restrictions Weight Bearing Restrictions: No     Mobility  Bed Mobility Overal bed mobility: Needs Assistance Bed Mobility: Rolling, Sidelying to Sit Rolling: Modified independent (Device/Increase time) Sidelying to sit: Min guard            Transfers Overall transfer level: Needs assistance Equipment used: Rolling walker (2 wheels) Transfers: Sit to/from Stand Sit to Stand: Min assist   Step pivot transfers: Min assist       General transfer comment: steadying assist, increased time to perform, pt needs reminders to reach back wtih RUE when sitting, decreased eccentric control but improved from previous week.      Stairs Stairs: Yes Stairs assistance: Mod assist, Min assist, +2 safety/equipment Stair Management: Two rails, Step to pattern, Forwards Number of Stairs: 5 (5 steps leading with RLE, then 5 with LLE) General stair comments: foot taps with RLE on step, then stepping up/down with RLE leading, then x5 reps with LLE leading, manual/tactile assist at times with RLE for stability, RN present to stabilize RW/for safety.   Wheelchair Mobility    Modified Rankin (Stroke Patients Only) Modified Rankin (Stroke Patients Only) Pre-Morbid Rankin Score: No symptoms Modified Rankin: Moderately severe disability     Balance Overall balance assessment: Needs assistance Sitting-balance support: No upper extremity supported, Feet supported Sitting balance-Leahy Scale: Fair Sitting balance - Comments: R lateral lean   Standing balance support: Bilateral upper extremity supported, Reliant on assistive device for balance Standing balance-Leahy Scale: Poor Standing balance comment: Reliant on external support, and RW                            Cognition Arousal/Alertness: Awake/alert Behavior During Therapy: Flat affect Overall Cognitive Status: Impaired/Different from baseline Area of Impairment: Problem solving  Problem Solving: Slow processing, Difficulty sequencing, Decreased initiation, Requires verbal cues General Comments: Pt cooperative, needed reminder to don his glasses.        Exercises      General Comments General comments (skin integrity, edema, etc.): VSS on RA      Pertinent Vitals/Pain Pain Assessment Pain Assessment: No/denies pain     PT Goals (current goals can now be found in the care plan section) Acute Rehab PT Goals Patient Stated Goal: to get home and see my kids PT Goal Formulation: With patient Time For Goal Achievement: 09/26/21 Progress towards PT goals: Progressing toward goals    Frequency    Min 4X/week      PT Plan Current plan remains appropriate       AM-PAC PT "6 Clicks" Mobility   Outcome Measure  Help needed turning from your back to your side while in a flat bed without using bedrails?: None Help needed moving from lying on your back to sitting on the side of a flat bed without using bedrails?: A Lot (mod cues for this and all items below) Help needed moving to and from a bed to a chair (including a wheelchair)?: A Lot Help needed standing up from a chair using your arms (e.g., wheelchair or bedside chair)?: A Lot Help needed to walk in hospital room?: A Lot Help needed climbing 3-5 steps with a railing? : A Lot 6 Click Score: 14    End of Session Equipment Utilized During Treatment: Gait belt Activity Tolerance: Patient tolerated treatment well Patient left: in chair;with call bell/phone within reach;with chair alarm set;with nursing/sitter in room Nurse Communication: Mobility status PT Visit Diagnosis: Other abnormalities of gait and mobility (R26.89);Other symptoms and signs involving the nervous system (R29.898)     Time: 1103-1594 PT Time Calculation (min) (ACUTE ONLY): 15 min  Charges:  $Therapeutic Exercise: 8-22 mins                     Jannet Calip P., PTA Acute Rehabilitation  Services Secure Chat Preferred 9a-5:30pm Office: [phone]    Dorathy Kinsman [facility] 09/22/2021, 6:54 PM

## 2021-09-22 NOTE — Progress Notes (Signed)
PROGRESS NOTE                                                                                                                                                                                                             Patient Demographics:    Martin Mullen, is a 41 y.o. male, DOB - May 25, 1980, OFH:[id number]  Outpatient Primary MD for the patient is [name], NP    LOS - 10  Admit date - 09/11/2021    Chief Complaint  Patient presents with   Code Stroke       Brief Narrative (HPI from H&P) -  41 y.o. 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    MRI brain was revealing for 6 mm acute ischemic nonhemorrhagic left ventral pons/midbrain infarct.  CTA head and neck with no large vessel occlusion.   Subjective:   No chest pain, no shortness of breath, no nausea, no vomiting, no headache, no new focal deficits   Assessment  & Plan :    Stroke due to noncompliance with his medications for several weeks   - 6 mm acute ischemic non-hemorrhagic left ventral pons/midbrain Infarct.  He has history of previous stroke, he also has history of prothrombin gene mutation, history of noncompliance with medications.  He does seem to have an acute stroke now, confirms that he was not taking any medications for 3 weeks prior to this hospital admission, counseled on compliance, currently on combination of aspirin and Eliquis, Crestor and insulin regimen also resumed.  A1c is greater than 14 LDL above goal. Echo stable full stroke work-up done.  Await placement to SNF/CIR.  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  They advise keeping on Eliquis  indefinitely.  HTN (hypertension) stable off of medications.  STEMI s/p RCA stent 08/2012 - Asymptomatic from a cardiac standpoint.  Continue Eliquis and aspirin combination along with statin for secondary prevention, ARB held to allow for permissive hypertension..  Dehydration with hypokalemia.  Potassium replaced, continue as needed hydration as needed with IV fluids.  Type 2 diabetes mellitus with hyperlipidemia - poor control in the outpatient setting as evident  by the extremely high A1c, he was not taking any medications for 3 weeks, counseled on compliance, placed on long-acting insulin and sliding scale.  Monitor and adjust  CBG (last 3)  Recent Labs    09/21/21 2117 09/22/21 0809 09/22/21 1230  GLUCAP 111* 107* 120*   Lab Results  Component Value Date   HGBA1C >14 08/05/2021         Condition - Extremely Guarded  Family Communication  :  friend Wyatt Mage - [phone]  - on 09/12/21, updated again on 09/18/2021 she is now the POA.  Code Status :  Full  Consults  :  Neuro  PUD Prophylaxis : Eliquis   Procedures  :     MRI - 1. 6 mm acute ischemic nonhemorrhagic left ventral pons/midbrain infarct. 2. Underlying chronic microvascular ischemic disease with multiple remote lacunar infarcts involving the right corona radiata, basal ganglia, thalami, and pons.  CTA - 1. No acute intracranial abnormality or significant interval change. 2. Stable atrophy and diffuse white matter disease. 3. Remote lacunar infarcts involving the internal capsule bilaterally and left thalamus. 4. Aspects 10/10   TTE - 1. Left ventricular ejection fraction, by estimation, is 60 to 65%. The left ventricle has normal function. The left ventricle has no regional wall motion abnormalities. Left ventricular diastolic parameters were normal.  2. Right ventricular systolic function is normal. The right ventricular size is normal.  3. The mitral valve is normal in structure. No evidence of mitral valve  regurgitation.  4. The aortic valve was not well visualized. Aortic valve regurgitation is not visualized.  5. IVC not well visualized.       Disposition Plan  : Patient is stable for discharge , awaiting safe discharge disposition and SNF bed availability.  Status is: Inpatient  DVT Prophylaxis  :    Place TED hose Start: 09/16/21 1807 apixaban (ELIQUIS) tablet 5 mg   Lab Results  Component Value Date   PLT 253 09/21/2021    Diet :  Diet Order             DIET - DYS 1 Room service appropriate? No; Fluid consistency: Nectar Thick  Diet effective now                    Inpatient Medications  Scheduled Meds:  apixaban  5 mg Oral BID   aspirin EC  81 mg Oral Daily   buPROPion  150 mg Oral Daily   insulin aspart  0-20 Units Subcutaneous TID PC & HS   insulin aspart  3 Units Subcutaneous TID WC   insulin glargine-yfgn  12 Units Subcutaneous Daily   nicotine  21 mg Transdermal Daily   mouth rinse  15 mL Mouth Rinse 4 times per day   rosuvastatin  20 mg Oral Daily   sodium chloride flush  3 mL Intravenous Once   Continuous Infusions:    PRN Meds:.acetaminophen **OR** acetaminophen (TYLENOL) oral liquid 160 mg/5 mL **OR** acetaminophen, food thickener, mouth rinse  Antibiotics  :    Anti-infectives (From admission, onward)    None        Huey Bienenstock M.D on 09/22/2021 at 2:34 PM  To page go to [email or web address]   [facility] -  Office  [phone]  See all Orders from today for further details    Objective:   Vitals:   09/21/21 2308 09/22/21 0346 09/22/21 0806 09/22/21 1225  BP: 100/70 113/79 113/79 109/86  Pulse: 95 75 66 82  Resp:  18 18 20    Temp: 97.9 F (36.6 C) 98 F (36.7 C) 97.7 F (36.5 C) 98.5 F (36.9 C)  TempSrc: Oral Oral Oral Oral  SpO2:   96% 98%  Weight:      Height:        Wt Readings from Last 3 Encounters:  09/15/21 87.2 kg  08/05/21 88.5 kg  12/23/20 107 kg     Intake/Output Summary (Last 24 hours) at  09/22/2021 1434 Last data filed at 09/22/2021 1000 Gross per 24 hour  Intake 240 ml  Output 400 ml  Net -160 ml   Physical Exam  Awake Alert, he has acute on chronic right-sided weakness are much weaker as compared to his right leg, strength in the right lower extremity 4/5 upper extremity 4/5 (improved) Symmetrical Chest wall movement, Good air movement bilaterally, CTAB RRR,No Gallops,Rubs or new Murmurs, No Parasternal Heave +ve B.Sounds, Abd Soft, No tenderness, No rebound - guarding or rigidity. No Cyanosis, Clubbing or edema, No new Rash or bruise       Data Review:    CBC Recent Labs  Lab 09/21/21 0505  WBC 10.5  HGB 15.2  HCT 43.1  PLT 253  MCV 91.1  MCH 32.1  MCHC 35.3  RDW 12.5    Electrolytes Recent Labs  Lab 09/16/21 0511 09/17/21 0454 09/21/21 0505  NA 139 139 140  K 3.6 3.6 3.8  CL 109 105 107  CO2 23 25 24   GLUCOSE 114* 105* 108*  BUN 8 11 16   CREATININE 0.69 0.84 0.87  CALCIUM 8.9 9.5 9.2  MG  --   --  1.7    ------------------------------------------------------------------------------------------------------------------ No results for input(s): "CHOL", "HDL", "LDLCALC", "TRIG", "CHOLHDL", "LDLDIRECT" in the last 72 hours.   Lab Results  Component Value Date   HGBA1C >14 08/05/2021      Radiology Reports No results found.

## 2021-09-23 LAB — GLUCOSE, CAPILLARY
Glucose-Capillary: 101 mg/dL — ABNORMAL HIGH (ref 70–99)
Glucose-Capillary: 152 mg/dL — ABNORMAL HIGH (ref 70–99)
Glucose-Capillary: 194 mg/dL — ABNORMAL HIGH (ref 70–99)

## 2021-09-23 NOTE — Progress Notes (Signed)
Speech Language Pathology Treatment: Dysphagia  Patient Details Name: Martin Mullen MRN: [id number] DOB: February 23, 1980 Today's Date: 09/23/2021 Time: 6553-7482 SLP Time Calculation (min) (ACUTE ONLY): 18 min  Assessment / Plan / Recommendation Clinical Impression  Pt eating breakfast and self feeding. Needs min cues for smaller bites occasionally and self feeding at an appropriate rate. Trace residue on right side not sensed by pt. Prior to noticing he had upper denture plate in room, pt given graham cracker solid that was adequately masticated and transited timely without residue. Upper plate then cleaned, pt donned however ill fitting. Unit secretary ordered adhesive for pt. Therapist recommends upgrade to Dys 2, continue nectar thick liquids, self feeding with supervision for strategies. Therapist will return when able for donning of upper plate with denture cream to further assist in oral manipulation.    HPI HPI: Patient is a 41 y.o. male with PMH: recurrent strokes, HTN, STEMI s/p RCA stent 2014, DM-2 insulin dependent, HLD, cocaine use. He presented to the hospital on 09/11/21 with slurred speech. MRI brain revealed 18mm acute ischemic nonhemmorhagic left ventral pons/midbrain infarct; CTA head and neck with no large vessel occlusion.      SLP Plan  Continue with current plan of care      Recommendations for follow up therapy are one component of a multi-disciplinary discharge planning process, led by the attending physician.  Recommendations may be updated based on patient status, additional functional criteria and insurance authorization.    Recommendations  Diet recommendations: Nectar-thick liquid;Dysphagia 2 (fine chop) Liquids provided via: Cup;No straw Medication Administration: Crushed with puree Supervision: Patient able to self feed;Full supervision/cueing for compensatory strategies Compensations: Slow rate;Small sips/bites;Minimize environmental distractions Postural  Changes and/or Swallow Maneuvers: Seated upright 90 degrees;Upright 30-60 min after meal                Oral Care Recommendations: Oral care BID Follow Up Recommendations:  (TBD) Assistance recommended at discharge: Frequent or constant Supervision/Assistance SLP Visit Diagnosis: Dysphagia, oropharyngeal phase (R13.12) Plan: Continue with current plan of care           Martin Mullen  09/23/2021, 10:02 AM

## 2021-09-23 NOTE — Progress Notes (Signed)
Orthopedic Tech Progress Note Patient Details:  Martin Mullen 18-Feb-1980 [id number] Custom AFO has been ordered from [facility]  Patient ID: Martin Mullen, male   DOB: 1980-11-19, 41 y.o.   MRN: [id number]  Martin Mullen 09/23/2021, 2:54 PM

## 2021-09-23 NOTE — Progress Notes (Signed)
PROGRESS NOTE                                                                                                                                                                                                             Patient Demographics:    Martin Mullen, is a 41 y.o. male, DOB - [birth year], OJJ:[id number]  Outpatient Primary MD for the patient is [name], NP    LOS - 11  Admit date - 09/11/2021    Chief Complaint  Patient presents with   Code Stroke       Brief Narrative (HPI from H&P)  -  41 y.o. 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    MRI brain was revealing for 6 mm acute ischemic nonhemorrhagic left ventral pons/midbrain infarct.  CTA head and neck with no large vessel occlusion.   Subjective:   No significant events overnight, patient denies any complaints    Assessment  & Plan :    Stroke due to noncompliance with his medications for several weeks    - 6 mm acute ischemic non-hemorrhagic left ventral pons/midbrain Infarct.  He has history of previous stroke, he also has history of prothrombin gene mutation, history of noncompliance with medications.  He does seem to have an acute stroke now, confirms that he was not taking any medications for 3 weeks prior to this hospital admission, counseled on compliance, currently on combination of aspirin and Eliquis, Crestor and insulin regimen also resumed.  A1c is greater than 14 LDL above goal. Echo stable full stroke work-up done.  Await placement to SNF/CIR.  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  They advise keeping on Eliquis indefinitely.  HTN (hypertension) stable  off of medications.  STEMI s/p RCA stent 08/2012 - Asymptomatic from a cardiac standpoint.  Continue Eliquis and aspirin combination along with statin for secondary prevention, ARB held to allow for permissive hypertension..  Dehydration with hypokalemia.  Potassium replaced, continue as needed hydration as needed with IV fluids.  Type 2 diabetes mellitus with hyperlipidemia - poor control in the outpatient setting as evident by the extremely high A1c, he  was not taking any medications for 3 weeks, counseled on compliance, placed on long-acting insulin and sliding scale.  Monitor and adjust  CBG (last 3)  Recent Labs    09/22/21 1708 09/22/21 2108 09/23/21 0816  GLUCAP 178* 193* 101*   Lab Results  Component Value Date   HGBA1C >14 08/05/2021         Condition - Extremely Guarded  Family Communication  :  friend Wyatt Mage - [phone]  - on 09/12/21, updated again on 09/18/2021 she is now the POA.  Code Status :  Full  Consults  :  Neuro  PUD Prophylaxis : Eliquis   Procedures  :     MRI - 1. 6 mm acute ischemic nonhemorrhagic left ventral pons/midbrain infarct. 2. Underlying chronic microvascular ischemic disease with multiple remote lacunar infarcts involving the right corona radiata, basal ganglia, thalami, and pons.  CTA - 1. No acute intracranial abnormality or significant interval change. 2. Stable atrophy and diffuse white matter disease. 3. Remote lacunar infarcts involving the internal capsule bilaterally and left thalamus. 4. Aspects 10/10   TTE - 1. Left ventricular ejection fraction, by estimation, is 60 to 65%. The left ventricle has normal function. The left ventricle has no regional wall motion abnormalities. Left ventricular diastolic parameters were normal.  2. Right ventricular systolic function is normal. The right ventricular size is normal.  3. The mitral valve is normal in structure. No evidence of mitral valve regurgitation.  4. The aortic valve was not well  visualized. Aortic valve regurgitation is not visualized.  5. IVC not well visualized.       Disposition Plan  : Patient is stable for discharge , awaiting safe discharge disposition and SNF bed availability.  Status is: Inpatient  DVT Prophylaxis  :    Place TED hose Start: 09/16/21 1807 apixaban (ELIQUIS) tablet 5 mg   Lab Results  Component Value Date   PLT 253 09/21/2021    Diet :  Diet Order             DIET DYS 2 Room service appropriate? Yes; Fluid consistency: Nectar Thick  Diet effective now                    Inpatient Medications  Scheduled Meds:  apixaban  5 mg Oral BID   aspirin EC  81 mg Oral Daily   buPROPion  150 mg Oral Daily   insulin aspart  0-20 Units Subcutaneous TID PC & HS   insulin aspart  3 Units Subcutaneous TID WC   insulin glargine-yfgn  12 Units Subcutaneous Daily   nicotine  21 mg Transdermal Daily   mouth rinse  15 mL Mouth Rinse 4 times per day   rosuvastatin  20 mg Oral Daily   sodium chloride flush  3 mL Intravenous Once   Continuous Infusions:    PRN Meds:.acetaminophen **OR** acetaminophen (TYLENOL) oral liquid 160 mg/5 mL **OR** acetaminophen, food thickener, mouth rinse  Antibiotics  :    Anti-infectives (From admission, onward)    None        Huey Bienenstock M.D on 09/23/2021 at 12:04 PM  To page go to [email or web address]   [facility] -  Office  [phone]  See all Orders from today for further details    Objective:   Vitals:   09/22/21 1956 09/23/21 0000 09/23/21 0342 09/23/21 0800  BP: 116/88 (!) 120/91 101/76 122/79  Pulse: 96 89 85 86  Resp: 16 15 18 17   Temp:  98.6 F (37 C) 98.3 F (36.8 C) 98 F (36.7 C) (!) 97.5 F (36.4 C)  TempSrc: Oral Oral Oral Oral  SpO2: 97% 98% 97% 96%  Weight:      Height:        Wt Readings from Last 3 Encounters:  09/15/21 87.2 kg  08/05/21 88.5 kg  12/23/20 107 kg     Intake/Output Summary (Last 24 hours) at 09/23/2021 1204 Last data filed at  09/23/2021 0100 Gross per 24 hour  Intake 480 ml  Output 650 ml  Net -170 ml   Physical Exam   Wakes up, somnolent this morning, he is with the acute on chronic right-sided weakness. Symmetrical Chest wall movement, Good air movement bilaterally, CTAB RRR,No Gallops,Rubs or new Murmurs, No Parasternal Heave +ve B.Sounds, Abd Soft, No tenderness, No rebound - guarding or rigidity. No Cyanosis, Clubbing or edema, No new Rash or bruise      Data Review:    CBC Recent Labs  Lab 09/21/21 0505  WBC 10.5  HGB 15.2  HCT 43.1  PLT 253  MCV 91.1  MCH 32.1  MCHC 35.3  RDW 12.5    Electrolytes Recent Labs  Lab 09/17/21 0454 09/21/21 0505  NA 139 140  K 3.6 3.8  CL 105 107  CO2 25 24  GLUCOSE 105* 108*  BUN 11 16  CREATININE 0.84 0.87  CALCIUM 9.5 9.2  MG  --  1.7    ------------------------------------------------------------------------------------------------------------------ No results for input(s): "CHOL", "HDL", "LDLCALC", "TRIG", "CHOLHDL", "LDLDIRECT" in the last 72 hours.   Lab Results  Component Value Date   HGBA1C >14 08/05/2021      Radiology Reports No results found.

## 2021-09-23 NOTE — TOC Progression Note (Addendum)
Transition of Care [facility]) - Progression Note    Patient Details  Name: Martin Mullen MRN: [id number] Date of Birth: Feb 06, 1981  Transition of Care [facility] CM/SW Contact  Mearl Latin, LCSW Phone Number: 09/23/2021, 9:45 AM  Clinical Narrative:    9:45am-Requested [facility] and [address] review for 2 week LOG.   11:20am-[facility] unable to accept patient Medicaid Pending. No response from [address] [facility]) yet.    Expected Discharge Plan: Skilled Nursing Facility Barriers to Discharge: Inadequate or no insurance, SNF Pending payor source - LOG, SNF Pending bed offer  Expected Discharge Plan and Services Expected Discharge Plan: Skilled Nursing Facility In-house Referral: Clinical Social Work   Post Acute Care Choice: Skilled Nursing Facility Living arrangements for the past 2 months: Mobile Home                                       Social Determinants of Health (SDOH) Interventions    Readmission Risk Interventions     No data to display

## 2021-09-23 NOTE — Progress Notes (Signed)
Physical Therapy Treatment Patient Details Name: Martin Mullen MRN: [id number] DOB: [birth year] Today's Date: 09/23/2021   History of Present Illness Pt is a 41 y/o M presenting to ED on 7/29 with slurred speech, MRI revealing 6mm acute inschemic non-hemorrhagic L ventral pons/midbrain infarct. CTA head and neck with no large vessel occlusion. PMH significant for STEMI s/p RCA stent 2014, HTN, recurrent strokes, insulin-dependent DM2, hyperlipidemia, substance use.    PT Comments    Pt received in supine, agreeable to therapy session and with good participation and tolerance for transfer and gait training. Pt with continued R quad/ankle instability and weakness and MD [place] notified he may benefit from consult to orthotist for custom AFO to reduce fall risk and protect R knee. Pt with poor insight into fatigue needing min to modA for gait using RW and close wheelchair follow for safety. Cues needed to take seated breaks when gait quality decreases with RLE fatigue. Pt continues to benefit from PT services to progress toward functional mobility goals.    Recommendations for follow up therapy are one component of a multi-disciplinary discharge planning process, led by the attending physician.  Recommendations may be updated based on patient status, additional functional criteria and insurance authorization.  Follow Up Recommendations  Acute inpatient rehab (3hours/day)     Assistance Recommended at Discharge Frequent or constant Supervision/Assistance  Patient can return home with the following A lot of help with walking and/or transfers;A lot of help with bathing/dressing/bathroom;Assistance with cooking/housework;Direct supervision/assist for medications management;Direct supervision/assist for financial management;Assist for transportation;Help with stairs or ramp for entrance   Equipment Recommendations  Other (comment) (TBD)    Recommendations for Other Services       Precautions /  Restrictions Precautions Precautions: Fall Precaution Comments: tendency to R knee buckle and asymptomatic orthostatic hypotension Restrictions Weight Bearing Restrictions: No     Mobility  Bed Mobility Overal bed mobility: Needs Assistance Bed Mobility: Supine to Sit Rolling: Modified independent (Device/Increase time)   Supine to sit: Min guard     General bed mobility comments: increased time/effort but good initiation today, needs assist to manage covers when exiting bed and min guard for stability upon raising trunk    Transfers Overall transfer level: Needs assistance Equipment used: Rolling walker (2 wheels) Transfers: Sit to/from Stand Sit to Stand: Min assist           General transfer comment: steadying assist, increased time to perform, pt needs reminders to reach back wtih RUE when sitting.    Ambulation/Gait Ambulation/Gait assistance: Mod assist, +2 safety/equipment Gait Distance (Feet): 50 Feet (14ft, seated break, then 96ft x3 trials with seated  breaks between) Assistive device: Rolling walker (2 wheels) Gait Pattern/deviations: Step-through pattern, Decreased step length - right, Decreased stride length, Knee hyperextension - right, Decreased weight shift to right, Decreased dorsiflexion - right, Narrow base of support, Shuffle       General Gait Details: cues for wider BOS and safer step sequencing, pt needing intermittent assist to control RW and for balance (especially when stepping backwards/turning and with fatigue). Pt with increased instability/RLE buckling with fatigue and needed cues for activity pacing/seated breaks as RLE fatigues and step length decreases/R foot drags. Close chair follow for safety. Increased R knee instability with fatigue, pt may benefit from some device for R knee support, MD messaged.   Modified Rankin (Stroke Patients Only) Modified Rankin (Stroke Patients Only) Pre-Morbid Rankin Score: No symptoms Modified Rankin:  Moderately severe disability     Balance Overall  balance assessment: Needs assistance Sitting-balance support: No upper extremity supported, Feet supported Sitting balance-Leahy Scale: Fair Sitting balance - Comments: R lateral lean   Standing balance support: Bilateral upper extremity supported, Reliant on assistive device for balance Standing balance-Leahy Scale: Poor Standing balance comment: Reliant on external support, and RW                            Cognition Arousal/Alertness: Awake/alert Behavior During Therapy: Flat affect Overall Cognitive Status: Impaired/Different from baseline Area of Impairment: Problem solving                             Problem Solving: Slow processing, Requires verbal cues General Comments: Pt cooperative, needed reminder to don his glasses.  Pt not able to indicate when he is fatigued.        Exercises      General Comments        Pertinent Vitals/Pain Pain Assessment Pain Assessment: No/denies pain     PT Goals (current goals can now be found in the care plan section) Acute Rehab PT Goals Patient Stated Goal: to get home and see my kids PT Goal Formulation: With patient Time For Goal Achievement: 09/26/21 Progress towards PT goals: Progressing toward goals    Frequency    Min 4X/week      PT Plan Current plan remains appropriate       AM-PAC PT "6 Clicks" Mobility   Outcome Measure  Help needed turning from your back to your side while in a flat bed without using bedrails?: None Help needed moving from lying on your back to sitting on the side of a flat bed without using bedrails?: A Little Help needed moving to and from a bed to a chair (including a wheelchair)?: A Lot Help needed standing up from a chair using your arms (e.g., wheelchair or bedside chair)?: A Lot Help needed to walk in hospital room?: A Lot Help needed climbing 3-5 steps with a railing? : A Lot 6 Click Score: 15    End  of Session Equipment Utilized During Treatment: Gait belt Activity Tolerance: Patient tolerated treatment well Patient left: in chair;with call bell/phone within reach;with chair alarm set;with nursing/sitter in room Nurse Communication: Mobility status PT Visit Diagnosis: Other abnormalities of gait and mobility (R26.89);Other symptoms and signs involving the nervous system (R29.898)     Time: 2423-5361 PT Time Calculation (min) (ACUTE ONLY): 30 min  Charges:  $Gait Training: 23-37 mins                     Chellie Vanlue P., PTA Acute Rehabilitation Services Secure Chat Preferred 9a-5:30pm Office: [phone]    Angus Palms 09/23/2021, 2:36 PM

## 2021-09-23 NOTE — Plan of Care (Signed)
  Problem: Education: Goal: Ability to describe self-care measures that may prevent or decrease complications (Diabetes Survival Skills Education) will improve Outcome: Progressing  Problem: Coping: Goal: Ability to adjust to condition or change in health will improve Outcome: Progressing   Problem: Fluid Volume: Goal: Ability to maintain a balanced intake and output will improve Outcome: Progressing   Problem: Metabolic: Goal: Ability to maintain appropriate glucose levels will improve Outcome: Progressing   Problem: Nutritional: Goal: Maintenance of adequate nutrition will improve Outcome: Progressing Goal: Progress toward achieving an optimal weight will improve Outcome: Progressing   Problem: Skin Integrity: Goal: Risk for impaired skin integrity will decrease Outcome: Progressing   Problem: Clinical Measurements: Goal: Ability to maintain clinical measurements within normal limits will improve Outcome: Progressing Goal: Will remain free from infection Outcome: Progressing Goal: Diagnostic test results will improve Outcome: Progressing Goal: Respiratory complications will improve Outcome: Progressing Goal: Cardiovascular complication will be avoided Outcome: Progressing   Problem: Elimination: Goal: Will not experience complications related to bowel motility Outcome: Progressing Goal: Will not experience complications related to urinary retention Outcome: Progressing   Problem: Pain Managment: Goal: General experience of comfort will improve Outcome: Progressing   Problem: Education: Goal: Knowledge of disease or condition will improve Outcome: Progressing Goal: Knowledge of secondary prevention will improve (SELECT ALL) Outcome: Progressing Goal: Knowledge of patient specific risk factors will improve (INDIVIDUALIZE FOR PATIENT) Outcome: Progressing  Problem: Self-Care: Goal: Ability to participate in self-care as condition permits will improve Outcome:  Progressing Goal: Verbalization of feelings and concerns over difficulty with self-care will improve Outcome: Progressing Goal: Ability to communicate needs accurately will improve Outcome: Progressing   Problem: Ischemic Stroke/TIA Tissue Perfusion: Goal: Complications of ischemic stroke/TIA will be minimized Outcome: Progressing

## 2021-09-24 LAB — GLUCOSE, CAPILLARY
Glucose-Capillary: 150 mg/dL — ABNORMAL HIGH (ref 70–99)
Glucose-Capillary: 112 mg/dL — ABNORMAL HIGH (ref 70–99)
Glucose-Capillary: 150 mg/dL — ABNORMAL HIGH (ref 70–99)
Glucose-Capillary: 191 mg/dL — ABNORMAL HIGH (ref 70–99)
Glucose-Capillary: 172 mg/dL — ABNORMAL HIGH (ref 70–99)

## 2021-09-24 NOTE — Progress Notes (Signed)
Speech Language Pathology Treatment: Dysphagia;Cognitive-Linquistic  Patient Details Name: LISLE SKILLMAN MRN: [id number] DOB: Jun 05, 1980 Today's Date: 09/24/2021 Time: 2094-7096 SLP Time Calculation (min) (ACUTE ONLY): 9 min  Assessment / Plan / Recommendation Clinical Impression  Denture cream ordered yesterday and pt with upper plate donned this am with good adhesion (no lower dentures or dentition). He likes the upgraded texture from puree and consumed bite sausage (Dys 2) in which he had strong cough indicative of possible airway entry -may have initiated phonation and inhaled piece sausage. He reported he coughed this am with breakfast several times this am. He does not want to downgrade back to puree and therapist agrees to continue for now with Dys 2 (minced), thin liquids, sit upright and eat slowly. Continue ST  He demonstrated adequate sustained attention to therapist, following commands. Exhibited basic problem solving and managing bed to raise head independently. Mildly impulsive during self feeding.    HPI HPI: Patient is a 41 y.o. male with PMH: recurrent strokes, HTN, STEMI s/p RCA stent 2014, DM-2 insulin dependent, HLD, cocaine use. He presented to the hospital on 09/11/21 with slurred speech. MRI brain revealed 38mm acute ischemic nonhemmorhagic left ventral pons/midbrain infarct; CTA head and neck with no large vessel occlusion.      SLP Plan  Continue with current plan of care      Recommendations for follow up therapy are one component of a multi-disciplinary discharge planning process, led by the attending physician.  Recommendations may be updated based on patient status, additional functional criteria and insurance authorization.    Recommendations  Diet recommendations: Dysphagia 2 (fine chop);Nectar-thick liquid Liquids provided via: Cup;No straw Medication Administration: Crushed with puree Supervision: Patient able to self feed;Full supervision/cueing for  compensatory strategies Compensations: Slow rate;Small sips/bites;Minimize environmental distractions Postural Changes and/or Swallow Maneuvers: Seated upright 90 degrees;Upright 30-60 min after meal                Oral Care Recommendations: Oral care BID Follow Up Recommendations: Follow physician's recommendations for discharge plan and follow up therapies Assistance recommended at discharge: Frequent or constant Supervision/Assistance SLP Visit Diagnosis: Dysphagia, oropharyngeal phase (R13.12) Plan: Continue with current plan of care           [name]  09/24/2021, 10:51 AM

## 2021-09-24 NOTE — Progress Notes (Signed)
Physical Therapy Treatment Patient Details Name: Martin Mullen MRN: [id number] DOB: 1981/01/07 Today's Date: 09/24/2021   History of Present Illness Pt is a 41 y/o M presenting to ED on 7/29 with slurred speech, MRI revealing 59mm acute inschemic non-hemorrhagic L ventral pons/midbrain infarct. CTA head and neck with no large vessel occlusion. PMH significant for STEMI s/p RCA stent 2014, HTN, recurrent strokes, insulin-dependent DM2, hyperlipidemia, substance use.    PT Comments    Pt received in supine, pleasantly agreeable to therapy session and eager to progress standing/gait tolerance. Pt needed totalA to don shoes and R AFO with shoe horn needed to get both on safely, and with improved RLE stability during gait trial with AFO. Pt RLE and instability more notable with fatigue, pt with decreased insight into activity tolerance and needs cues for standing/seated rest breaks. Pt making good progress toward goals, due for progress note and goal update from supervising PT next session. Pt would most benefit from high intensity post-acute rehab. Pt continues to benefit from PT services to progress toward functional mobility goals.    Recommendations for follow up therapy are one component of a multi-disciplinary discharge planning process, led by the attending physician.  Recommendations may be updated based on patient status, additional functional criteria and insurance authorization.  Follow Up Recommendations  Acute inpatient rehab (3hours/day)     Assistance Recommended at Discharge Frequent or constant Supervision/Assistance  Patient can return home with the following A lot of help with walking and/or transfers;A lot of help with bathing/dressing/bathroom;Assistance with cooking/housework;Direct supervision/assist for medications management;Direct supervision/assist for financial management;Assist for transportation;Help with stairs or ramp for entrance   Equipment Recommendations  Other  (comment) (TBD)    Recommendations for Other Services Rehab consult     Precautions / Restrictions Precautions Precautions: Fall Precaution Comments: tendency to R knee buckle and asymptomatic orthostatic hypotension Required Braces or Orthoses: Other Brace Other Brace: RLE custom AFO in room with shoes Restrictions Weight Bearing Restrictions: No     Mobility  Bed Mobility Overal bed mobility: Needs Assistance Bed Mobility: Supine to Sit Rolling: Modified independent (Device/Increase time)   Supine to sit: Min guard     General bed mobility comments: increased time/effort especially when sitting up toward L side of bed, needs assist to manage covers when exiting bed and min guard for stability upon raising trunk.    Transfers Overall transfer level: Needs assistance Equipment used: Rolling walker (2 wheels) Transfers: Sit to/from Stand Sit to Stand: Min assist           General transfer comment: steadying assist, increased time to perform.    Ambulation/Gait Ambulation/Gait assistance: Mod assist Gait Distance (Feet): 150 Feet Assistive device: Rolling walker (2 wheels) Gait Pattern/deviations: Step-through pattern, Decreased step length - right, Decreased stride length, Knee hyperextension - right, Decreased dorsiflexion - right, Narrow base of support, Knee hyperextension - left, Staggering left, Staggering right       General Gait Details: cues for wider BOS and safer step sequencing, pt needing intermittent assist to control RW and for balance (especially when stepping backwards/turning and with fatigue). Pt with bilateral knee hyperextension in stance phase, however RLE appearing more stable with R AFO donned today. Pt needing reminders to slow down his gait speed/decrease step length as he fatigues as he tends to leave RLE behind and episode of scissoring with fatigue. mostly modA for safety, LOB and RW mgmt.   Stairs  Wheelchair Mobility     Modified Rankin (Stroke Patients Only) Modified Rankin (Stroke Patients Only) Pre-Morbid Rankin Score: No symptoms Modified Rankin: Moderately severe disability     Balance Overall balance assessment: Needs assistance Sitting-balance support: No upper extremity supported, Feet supported Sitting balance-Leahy Scale: Fair Sitting balance - Comments: R lateral lean   Standing balance support: Bilateral upper extremity supported, Reliant on assistive device for balance Standing balance-Leahy Scale: Poor Standing balance comment: Reliant on external support, and RW                            Cognition Arousal/Alertness: Awake/alert Behavior During Therapy: Flat affect Overall Cognitive Status: Impaired/Different from baseline Area of Impairment: Problem solving                             Problem Solving: Slow processing, Requires verbal cues General Comments: Pt cooperative and eager to work on ambulation. Pt not able to indicate when he is fatigued.        Exercises      General Comments General comments (skin integrity, edema, etc.): BP 120/91 seated; BP 111/97 standing initially; BP 133/100 seated post-exertion. HR 89 bpm resting and up to 103-111 bpm ambulating      Pertinent Vitals/Pain Pain Assessment Pain Assessment: No/denies pain Faces Pain Scale: No hurt           PT Goals (current goals can now be found in the care plan section) Acute Rehab PT Goals Patient Stated Goal: to get home and see my kids PT Goal Formulation: With patient Time For Goal Achievement: 09/26/21 Progress towards PT goals: Progressing toward goals    Frequency    Min 4X/week      PT Plan Current plan remains appropriate       AM-PAC PT "6 Clicks" Mobility   Outcome Measure  Help needed turning from your back to your side while in a flat bed without using bedrails?: None Help needed moving from lying on your back to sitting on the side of a flat  bed without using bedrails?: A Little Help needed moving to and from a bed to a chair (including a wheelchair)?: A Lot Help needed standing up from a chair using your arms (e.g., wheelchair or bedside chair)?: A Lot Help needed to walk in hospital room?: A Lot Help needed climbing 3-5 steps with a railing? : A Lot 6 Click Score: 15    End of Session Equipment Utilized During Treatment: Gait belt Activity Tolerance: Patient tolerated treatment well Patient left: in chair;with call bell/phone within reach;with chair alarm set Nurse Communication: Mobility status PT Visit Diagnosis: Other abnormalities of gait and mobility (R26.89);Other symptoms and signs involving the nervous system (R29.898)     Time: 7858-8502 PT Time Calculation (min) (ACUTE ONLY): 29 min  Charges:  $Gait Training: 8-22 mins $Therapeutic Activity: 8-22 mins                     Tahmir Kleckner P., PTA Acute Rehabilitation Services Secure Chat Preferred 9a-5:30pm Office: [phone]    Dorathy Kinsman [facility] 09/24/2021, 6:40 PM

## 2021-09-24 NOTE — Progress Notes (Signed)
PROGRESS NOTE                                                                                                                                                                                                             Patient Demographics:    Martin Mullen, is a 41 y.o. male, DOB - October 11, 1980, ZDG:[id number]  Outpatient Primary MD for the patient is [name], NP    LOS - 12  Admit date - 09/11/2021    Chief Complaint  Patient presents with   Code Stroke       Brief Narrative (HPI from H&P)  -  41 y.o. 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    MRI brain was revealing for 6 mm acute ischemic nonhemorrhagic left ventral pons/midbrain infarct.  CTA head and neck with no large vessel occlusion.   Subjective:   No significant events overnight, patient denies any complaints    Assessment  & Plan :    Stroke due to noncompliance with his medications for several weeks    - 6 mm acute ischemic non-hemorrhagic left ventral pons/midbrain Infarct.  He has history of previous stroke, he also has history of prothrombin gene mutation, history of noncompliance with medications.  He does seem to have an acute stroke now, confirms that he was not taking any medications for 3 weeks prior to this hospital admission, counseled on compliance, currently on combination of aspirin and Eliquis, Crestor and insulin regimen also resumed.  A1c is greater than 14 LDL above goal. Echo stable full stroke work-up done.  Await placement to SNF/CIR.  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  They advise keeping on Eliquis indefinitely.  HTN (hypertension) stable  off of medications.  STEMI s/p RCA stent 08/2012 - Asymptomatic from a cardiac standpoint.  Continue Eliquis and aspirin combination along with statin for secondary prevention, ARB held to allow for permissive hypertension..  Dehydration with hypokalemia.  Potassium replaced, continue as needed hydration as needed with IV fluids.  Type 2 diabetes mellitus with hyperlipidemia - poor control in the outpatient setting as evident by the extremely high A1c, he  was not taking any medications for 3 weeks, counseled on compliance, placed on long-acting insulin and sliding scale.  Monitor and adjust  CBG (last 3)  Recent Labs    09/23/21 2053 09/24/21 0820 09/24/21 1209  GLUCAP 194* 112* 150*   Lab Results  Component Value Date   HGBA1C >14 08/05/2021         Condition - Extremely Guarded  Family Communication  :  friend Wyatt Mage - [phone]  - on 09/12/21, updated again on 09/18/2021 she is now the POA.  Code Status :  Full  Consults  :  Neuro  PUD Prophylaxis : Eliquis   Procedures  :     MRI - 1. 6 mm acute ischemic nonhemorrhagic left ventral pons/midbrain infarct. 2. Underlying chronic microvascular ischemic disease with multiple remote lacunar infarcts involving the right corona radiata, basal ganglia, thalami, and pons.  CTA - 1. No acute intracranial abnormality or significant interval change. 2. Stable atrophy and diffuse white matter disease. 3. Remote lacunar infarcts involving the internal capsule bilaterally and left thalamus. 4. Aspects 10/10   TTE - 1. Left ventricular ejection fraction, by estimation, is 60 to 65%. The left ventricle has normal function. The left ventricle has no regional wall motion abnormalities. Left ventricular diastolic parameters were normal.  2. Right ventricular systolic function is normal. The right ventricular size is normal.  3. The mitral valve is normal in structure. No evidence of mitral valve regurgitation.  4. The aortic valve was not well  visualized. Aortic valve regurgitation is not visualized.  5. IVC not well visualized.       Disposition Plan  : Patient is stable for discharge , awaiting safe discharge disposition and SNF bed availability.  Status is: Inpatient  DVT Prophylaxis  :    Place TED hose Start: 09/16/21 1807 apixaban (ELIQUIS) tablet 5 mg   Lab Results  Component Value Date   PLT 253 09/21/2021    Diet :  Diet Order             DIET DYS 2 Room service appropriate? Yes; Fluid consistency: Nectar Thick  Diet effective now                    Inpatient Medications  Scheduled Meds:  apixaban  5 mg Oral BID   aspirin EC  81 mg Oral Daily   buPROPion  150 mg Oral Daily   insulin aspart  0-20 Units Subcutaneous TID PC & HS   insulin aspart  3 Units Subcutaneous TID WC   insulin glargine-yfgn  12 Units Subcutaneous Daily   nicotine  21 mg Transdermal Daily   mouth rinse  15 mL Mouth Rinse 4 times per day   rosuvastatin  20 mg Oral Daily   sodium chloride flush  3 mL Intravenous Once   Continuous Infusions:    PRN Meds:.acetaminophen **OR** acetaminophen (TYLENOL) oral liquid 160 mg/5 mL **OR** acetaminophen, food thickener, mouth rinse  Antibiotics  :    Anti-infectives (From admission, onward)    None        Huey Bienenstock M.D on 09/24/2021 at 2:29 PM  To page go to [email or web address]   [facility] -  Office  [phone]  See all Orders from today for further details    Objective:   Vitals:   09/23/21 2355 09/24/21 0400 09/24/21 0800 09/24/21 1200  BP: 120/89 108/65 119/73 116/78  Pulse:    85  Resp: 16 18 18 19   Temp: 98.1  F (36.7 C)  97.9 F (36.6 C)   TempSrc: Oral  Oral   SpO2:  98%    Weight:      Height:        Wt Readings from Last 3 Encounters:  09/15/21 87.2 kg  08/05/21 88.5 kg  12/23/20 107 kg    No intake or output data in the 24 hours ending 09/24/21 1429  Physical Exam   Wakes up, somnolent this morning, he is with the acute on  chronic right-sided weakness. Symmetrical Chest wall movement, Good air movement bilaterally, CTAB RRR,No Gallops,Rubs or new Murmurs, No Parasternal Heave +ve B.Sounds, Abd Soft, No tenderness, No rebound - guarding or rigidity. No Cyanosis, Clubbing or edema, No new Rash or bruise       Data Review:    CBC Recent Labs  Lab 09/21/21 0505  WBC 10.5  HGB 15.2  HCT 43.1  PLT 253  MCV 91.1  MCH 32.1  MCHC 35.3  RDW 12.5    Electrolytes Recent Labs  Lab 09/21/21 0505  NA 140  K 3.8  CL 107  CO2 24  GLUCOSE 108*  BUN 16  CREATININE 0.87  CALCIUM 9.2  MG 1.7    ------------------------------------------------------------------------------------------------------------------ No results for input(s): "CHOL", "HDL", "LDLCALC", "TRIG", "CHOLHDL", "LDLDIRECT" in the last 72 hours.   Lab Results  Component Value Date   HGBA1C >14 08/05/2021      Radiology Reports No results found.

## 2021-09-25 LAB — GLUCOSE, CAPILLARY
Glucose-Capillary: 118 mg/dL — ABNORMAL HIGH (ref 70–99)
Glucose-Capillary: 141 mg/dL — ABNORMAL HIGH (ref 70–99)
Glucose-Capillary: 108 mg/dL — ABNORMAL HIGH (ref 70–99)
Glucose-Capillary: 223 mg/dL — ABNORMAL HIGH (ref 70–99)
Glucose-Capillary: 158 mg/dL — ABNORMAL HIGH (ref 70–99)

## 2021-09-25 NOTE — TOC Progression Note (Signed)
Transition of Care [facility]) - Progression Note    Patient Details  Name: Martin Mullen MRN: [id number] Date of Birth: 1980-11-21  Transition of Care [facility]) CM/SW Contact  Mearl Latin, LCSW Phone Number: 09/25/2021, 10:19 AM  Clinical Narrative:    No SNF bed offers currently.    Expected Discharge Plan: Skilled Nursing Facility Barriers to Discharge: Inadequate or no insurance, SNF Pending payor source - LOG, SNF Pending bed offer  Expected Discharge Plan and Services Expected Discharge Plan: Skilled Nursing Facility In-house Referral: Clinical Social Work   Post Acute Care Choice: Skilled Nursing Facility Living arrangements for the past 2 months: Mobile Home                                       Social Determinants of Health (SDOH) Interventions    Readmission Risk Interventions     No data to display

## 2021-09-25 NOTE — Plan of Care (Signed)
  Problem: Education: Goal: Ability to describe self-care measures that may prevent or decrease complications (Diabetes Survival Skills Education) will improve Outcome: Progressing Goal: Individualized Educational Video(s) Outcome: Progressing   Problem: Coping: Goal: Ability to adjust to condition or change in health will improve Outcome: Progressing   Problem: Fluid Volume: Goal: Ability to maintain a balanced intake and output will improve Outcome: Progressing   Problem: Health Behavior/Discharge Planning: Goal: Ability to identify and utilize available resources and services will improve Outcome: Progressing Goal: Ability to manage health-related needs will improve Outcome: Progressing   Problem: Nutritional: Goal: Maintenance of adequate nutrition will improve Outcome: Progressing Goal: Progress toward achieving an optimal weight will improve Outcome: Progressing   Problem: Skin Integrity: Goal: Risk for impaired skin integrity will decrease Outcome: Progressing   Problem: Health Behavior/Discharge Planning: Goal: Ability to manage health-related needs will improve Outcome: Progressing   Problem: Clinical Measurements: Goal: Ability to maintain clinical measurements within normal limits will improve Outcome: Progressing Goal: Will remain free from infection Outcome: Progressing Goal: Diagnostic test results will improve Outcome: Progressing Goal: Respiratory complications will improve Outcome: Progressing Goal: Cardiovascular complication will be avoided Outcome: Progressing   Problem: Activity: Goal: Risk for activity intolerance will decrease Outcome: Progressing   Problem: Elimination: Goal: Will not experience complications related to bowel motility Outcome: Progressing Goal: Will not experience complications related to urinary retention Outcome: Progressing   Problem: Pain Managment: Goal: General experience of comfort will improve Outcome:  Progressing   Problem: Safety: Goal: Ability to remain free from injury will improve Outcome: Progressing   Problem: Education: Goal: Knowledge of disease or condition will improve Outcome: Progressing Goal: Knowledge of secondary prevention will improve (SELECT ALL) Outcome: Progressing Goal: Knowledge of patient specific risk factors will improve (INDIVIDUALIZE FOR PATIENT) Outcome: Progressing Goal: Individualized Educational Video(s) Outcome: Progressing   Problem: Coping: Goal: Will verbalize positive feelings about self Outcome: Progressing Goal: Will identify appropriate support needs Outcome: Progressing   Problem: Self-Care: Goal: Ability to participate in self-care as condition permits will improve Outcome: Progressing Goal: Verbalization of feelings and concerns over difficulty with self-care will improve Outcome: Progressing Goal: Ability to communicate needs accurately will improve Outcome: Progressing   Problem: Ischemic Stroke/TIA Tissue Perfusion: Goal: Complications of ischemic stroke/TIA will be minimized Outcome: Progressing

## 2021-09-25 NOTE — Progress Notes (Signed)
PROGRESS NOTE                                                                                                                                                                                                             Patient Demographics:    Martin Mullen, is a 41 y.o. male, DOB - 11-Jun-1980, JQB:[id number]  Outpatient Primary MD for the patient is [name], NP    LOS - 13  Admit date - 09/11/2021    Chief Complaint  Patient presents with   Code Stroke       Brief Narrative (HPI from H&P)  -  41 y.o. 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    MRI brain was revealing for 6 mm acute ischemic nonhemorrhagic left ventral pons/midbrain infarct.  CTA head and neck with no large vessel occlusion.   Subjective:   No significant events overnight as discussed with staff, patient denies any complaints.   Assessment  & Plan :    Stroke due to noncompliance with his medications for several weeks    - 6 mm acute ischemic non-hemorrhagic left ventral pons/midbrain Infarct.  He has history of previous stroke, he also has history of prothrombin gene mutation, history of noncompliance with medications.  He does seem to have an acute stroke now, confirms that he was not taking any medications for 3 weeks prior to this hospital admission, counseled on compliance, currently on combination of aspirin and Eliquis, Crestor and insulin regimen also resumed.  A1c is greater than 14 LDL above goal. Echo stable full stroke work-up done.  Await placement to SNF/CIR.  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  They advise keeping on Eliquis indefinitely.  HTN  (hypertension) stable off of medications.  STEMI s/p RCA stent 08/2012 - Asymptomatic from a cardiac standpoint.  Continue Eliquis and aspirin combination along with statin for secondary prevention, ARB held to allow for permissive hypertension..  Dehydration with hypokalemia.  Potassium replaced, continue as needed hydration as needed with IV fluids.  Type 2 diabetes mellitus with hyperlipidemia - poor control in the outpatient setting as evident by the extremely  high A1c, he was not taking any medications for 3 weeks, counseled on compliance, placed on long-acting insulin and sliding scale.  Monitor and adjust  CBG (last 3)  Recent Labs    09/24/21 2106 09/25/21 0904 09/25/21 0946  GLUCAP 172* 118* 141*   Lab Results  Component Value Date   HGBA1C >14 08/05/2021         Condition - Extremely Guarded  Family Communication  :  friend Wyatt Mage - [phone]  - on 09/12/21, updated again on 09/18/2021 she is now the POA.  Code Status :  Full  Consults  :  Neuro  PUD Prophylaxis : Eliquis   Procedures  :     MRI - 1. 6 mm acute ischemic nonhemorrhagic left ventral pons/midbrain infarct. 2. Underlying chronic microvascular ischemic disease with multiple remote lacunar infarcts involving the right corona radiata, basal ganglia, thalami, and pons.  CTA - 1. No acute intracranial abnormality or significant interval change. 2. Stable atrophy and diffuse white matter disease. 3. Remote lacunar infarcts involving the internal capsule bilaterally and left thalamus. 4. Aspects 10/10   TTE - 1. Left ventricular ejection fraction, by estimation, is 60 to 65%. The left ventricle has normal function. The left ventricle has no regional wall motion abnormalities. Left ventricular diastolic parameters were normal.  2. Right ventricular systolic function is normal. The right ventricular size is normal.  3. The mitral valve is normal in structure. No evidence of mitral valve regurgitation.  4. The  aortic valve was not well visualized. Aortic valve regurgitation is not visualized.  5. IVC not well visualized.       Disposition Plan  : Patient is stable for discharge , awaiting safe discharge disposition and SNF bed availability.  Status is: Inpatient  DVT Prophylaxis  :    Place TED hose Start: 09/16/21 1807 apixaban (ELIQUIS) tablet 5 mg   Lab Results  Component Value Date   PLT 253 09/21/2021    Diet :  Diet Order             DIET DYS 2 Room service appropriate? Yes; Fluid consistency: Nectar Thick  Diet effective now                    Inpatient Medications  Scheduled Meds:  apixaban  5 mg Oral BID   aspirin EC  81 mg Oral Daily   buPROPion  150 mg Oral Daily   insulin aspart  0-20 Units Subcutaneous TID PC & HS   insulin aspart  3 Units Subcutaneous TID WC   insulin glargine-yfgn  12 Units Subcutaneous Daily   nicotine  21 mg Transdermal Daily   mouth rinse  15 mL Mouth Rinse 4 times per day   rosuvastatin  20 mg Oral Daily   sodium chloride flush  3 mL Intravenous Once   Continuous Infusions:    PRN Meds:.acetaminophen **OR** acetaminophen (TYLENOL) oral liquid 160 mg/5 mL **OR** acetaminophen, food thickener, mouth rinse  Antibiotics  :    Anti-infectives (From admission, onward)    None        Huey Bienenstock M.D on 09/25/2021 at 12:24 PM  To page go to [email or web address]   [facility] -  Office  [phone]  See all Orders from today for further details    Objective:   Vitals:   09/24/21 2000 09/25/21 0000 09/25/21 0333 09/25/21 0850  BP: 109/84 121/74 103/66 130/87  Pulse: 82 87 76 90  Resp:  18 18 18  Temp: 98 F (36.7 C) 98.3 F (36.8 C) 98.1 F (36.7 C) 98 F (36.7 C)  TempSrc: Oral Oral Oral Oral  SpO2: 97% 98% 96%   Weight:      Height:        Wt Readings from Last 3 Encounters:  09/15/21 87.2 kg  08/05/21 88.5 kg  12/23/20 107 kg     Intake/Output Summary (Last 24 hours) at 09/25/2021 1224 Last data  filed at 09/25/2021 1000 Gross per 24 hour  Intake 360 ml  Output --  Net 360 ml    Physical Exam  Awake Alert, he is more conversant today, but speech is  slow Symmetrical Chest wall movement, Good air movement bilaterally, CTAB RRR,No Gallops,Rubs or new Murmurs, No Parasternal Heave +ve B.Sounds, Abd Soft, No tenderness, No rebound - guarding or rigidity. No Cyanosis, Clubbing or edema, No new Rash or bruise      Data Review:    CBC Recent Labs  Lab 09/21/21 0505  WBC 10.5  HGB 15.2  HCT 43.1  PLT 253  MCV 91.1  MCH 32.1  MCHC 35.3  RDW 12.5    Electrolytes Recent Labs  Lab 09/21/21 0505  NA 140  K 3.8  CL 107  CO2 24  GLUCOSE 108*  BUN 16  CREATININE 0.87  CALCIUM 9.2  MG 1.7    ------------------------------------------------------------------------------------------------------------------ No results for input(s): "CHOL", "HDL", "LDLCALC", "TRIG", "CHOLHDL", "LDLDIRECT" in the last 72 hours.   Lab Results  Component Value Date   HGBA1C >14 08/05/2021      Radiology Reports No results found.

## 2021-09-26 LAB — GLUCOSE, CAPILLARY
Glucose-Capillary: 96 mg/dL (ref 70–99)
Glucose-Capillary: 114 mg/dL — ABNORMAL HIGH (ref 70–99)
Glucose-Capillary: 200 mg/dL — ABNORMAL HIGH (ref 70–99)
Glucose-Capillary: 141 mg/dL — ABNORMAL HIGH (ref 70–99)

## 2021-09-26 LAB — CBC
HCT: 44.8 % (ref 39.0–52.0)
Hemoglobin: 15.8 g/dL (ref 13.0–17.0)
MCH: 32 pg (ref 26.0–34.0)
MCHC: 35.3 g/dL (ref 30.0–36.0)
MCV: 90.9 fL (ref 80.0–100.0)
Platelets: 258 10*3/uL (ref 150–400)
RBC: 4.93 MIL/uL (ref 4.22–5.81)
RDW: 12.5 % (ref 11.5–15.5)
WBC: 12.5 10*3/uL — ABNORMAL HIGH (ref 4.0–10.5)
nRBC: 0 % (ref 0.0–0.2)

## 2021-09-26 LAB — BASIC METABOLIC PANEL
Anion gap: 9 (ref 5–15)
BUN: 13 mg/dL (ref 6–20)
CO2: 26 mmol/L (ref 22–32)
Calcium: 9.2 mg/dL (ref 8.9–10.3)
Chloride: 106 mmol/L (ref 98–111)
Creatinine, Ser: 0.86 mg/dL (ref 0.61–1.24)
GFR, Estimated: >60 mL/min (ref >60)
Glucose, Bld: 82 mg/dL (ref 70–99)
Potassium: 3.5 mmol/L (ref 3.5–5.1)
Sodium: 141 mmol/L (ref 135–145)

## 2021-09-26 NOTE — Plan of Care (Signed)
  Problem: Education: Goal: Ability to describe self-care measures that may prevent or decrease complications (Diabetes Survival Skills Education) will improve Outcome: Progressing Goal: Individualized Educational Video(s) Outcome: Progressing   Problem: Coping: Goal: Ability to adjust to condition or change in health will improve Outcome: Progressing   Problem: Fluid Volume: Goal: Ability to maintain a balanced intake and output will improve Outcome: Progressing   Problem: Health Behavior/Discharge Planning: Goal: Ability to identify and utilize available resources and services will improve Outcome: Progressing Goal: Ability to manage health-related needs will improve Outcome: Progressing   Problem: Metabolic: Goal: Ability to maintain appropriate glucose levels will improve Outcome: Progressing   Problem: Nutritional: Goal: Maintenance of adequate nutrition will improve Outcome: Progressing Goal: Progress toward achieving an optimal weight will improve Outcome: Progressing   Problem: Skin Integrity: Goal: Risk for impaired skin integrity will decrease Outcome: Progressing   Problem: Tissue Perfusion: Goal: Adequacy of tissue perfusion will improve Outcome: Progressing   Problem: Education: Goal: Knowledge of General Education information will improve Description: Including pain rating scale, medication(s)/side effects and non-pharmacologic comfort measures Outcome: Progressing   Problem: Health Behavior/Discharge Planning: Goal: Ability to manage health-related needs will improve Outcome: Progressing   Problem: Clinical Measurements: Goal: Ability to maintain clinical measurements within normal limits will improve Outcome: Progressing Goal: Will remain free from infection Outcome: Progressing Goal: Diagnostic test results will improve Outcome: Progressing Goal: Respiratory complications will improve Outcome: Progressing Goal: Cardiovascular complication will  be avoided Outcome: Progressing   Problem: Nutrition: Goal: Adequate nutrition will be maintained Outcome: Progressing   Problem: Coping: Goal: Level of anxiety will decrease Outcome: Progressing   Problem: Elimination: Goal: Will not experience complications related to bowel motility Outcome: Progressing Goal: Will not experience complications related to urinary retention Outcome: Progressing   Problem: Pain Managment: Goal: General experience of comfort will improve Outcome: Progressing   Problem: Safety: Goal: Ability to remain free from injury will improve Outcome: Progressing   Problem: Skin Integrity: Goal: Risk for impaired skin integrity will decrease Outcome: Progressing   Problem: Education: Goal: Knowledge of disease or condition will improve Outcome: Progressing Goal: Knowledge of secondary prevention will improve (SELECT ALL) Outcome: Progressing Goal: Knowledge of patient specific risk factors will improve (INDIVIDUALIZE FOR PATIENT) Outcome: Progressing Goal: Individualized Educational Video(s) Outcome: Progressing   Problem: Coping: Goal: Will verbalize positive feelings about self Outcome: Progressing Goal: Will identify appropriate support needs Outcome: Progressing   Problem: Health Behavior/Discharge Planning: Goal: Ability to manage health-related needs will improve Outcome: Progressing   Problem: Self-Care: Goal: Ability to participate in self-care as condition permits will improve Outcome: Progressing Goal: Verbalization of feelings and concerns over difficulty with self-care will improve Outcome: Progressing Goal: Ability to communicate needs accurately will improve Outcome: Progressing   Problem: Nutrition: Goal: Risk of aspiration will decrease Outcome: Progressing Goal: Dietary intake will improve Outcome: Progressing   Problem: Ischemic Stroke/TIA Tissue Perfusion: Goal: Complications of ischemic stroke/TIA will be  minimized Outcome: Progressing

## 2021-09-26 NOTE — Progress Notes (Signed)
PROGRESS NOTE                                                                                                                                                                                                             Patient Demographics:    Martin Mullen, is a 41 y.o. male, DOB - 12-05-1980, WLS:[id number]  Outpatient Primary MD for the patient is [name], NP    LOS - 14  Admit date - 09/11/2021    Chief Complaint  Patient presents with   Code Stroke       Brief Narrative (HPI from H&P)  -  41 y.o. 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    MRI brain was revealing for 6 mm acute ischemic nonhemorrhagic left ventral pons/midbrain infarct.  CTA head and neck with no large vessel occlusion.   Subjective:   No significant events overnight as discussed with staff, patient denies any complaints.   Assessment  & Plan :    Stroke due to noncompliance with his medications for several weeks    - 6 mm acute ischemic non-hemorrhagic left ventral pons/midbrain Infarct.  He has history of previous stroke, he also has history of prothrombin gene mutation, history of noncompliance with medications.  He does seem to have an acute stroke now, confirms that he was not taking any medications for 3 weeks prior to this hospital admission, counseled on compliance, currently on combination of aspirin and Eliquis, Crestor and insulin regimen also resumed.  A1c is greater than 14 LDL above goal. Echo stable full stroke work-up done.  Await placement to SNF/CIR.  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  They advise keeping on Eliquis indefinitely.  HTN  (hypertension) stable off of medications.  STEMI s/p RCA stent 08/2012 - Asymptomatic from a cardiac standpoint.  Continue Eliquis and aspirin combination along with statin for secondary prevention, ARB held to allow for permissive hypertension..  Dehydration with hypokalemia.  Potassium replaced, continue as needed hydration as needed with IV fluids.  Type 2 diabetes mellitus with hyperlipidemia - poor control in the outpatient setting as evident by the extremely  high A1c, he was not taking any medications for 3 weeks, counseled on compliance, placed on long-acting insulin and sliding scale.  Monitor and adjust  CBG (last 3)  Recent Labs    09/25/21 1636 09/25/21 2116 09/26/21 0732  GLUCAP 223* 158* 96   Lab Results  Component Value Date   HGBA1C >14 08/05/2021         Condition - Extremely Guarded  Family Communication  :  friend Wyatt Mage - [phone]  - on 09/12/21, updated again on 09/18/2021 she is now the POA.  Code Status :  Full  Consults  :  Neuro  PUD Prophylaxis : Eliquis   Procedures  :     MRI - 1. 6 mm acute ischemic nonhemorrhagic left ventral pons/midbrain infarct. 2. Underlying chronic microvascular ischemic disease with multiple remote lacunar infarcts involving the right corona radiata, basal ganglia, thalami, and pons.  CTA - 1. No acute intracranial abnormality or significant interval change. 2. Stable atrophy and diffuse white matter disease. 3. Remote lacunar infarcts involving the internal capsule bilaterally and left thalamus. 4. Aspects 10/10   TTE - 1. Left ventricular ejection fraction, by estimation, is 60 to 65%. The left ventricle has normal function. The left ventricle has no regional wall motion abnormalities. Left ventricular diastolic parameters were normal.  2. Right ventricular systolic function is normal. The right ventricular size is normal.  3. The mitral valve is normal in structure. No evidence of mitral valve regurgitation.  4. The  aortic valve was not well visualized. Aortic valve regurgitation is not visualized.  5. IVC not well visualized.       Disposition Plan  : Patient is stable for discharge , awaiting safe discharge disposition and SNF bed availability.  Status is: Inpatient  DVT Prophylaxis  :    Place TED hose Start: 09/16/21 1807 apixaban (ELIQUIS) tablet 5 mg   Lab Results  Component Value Date   PLT 258 09/26/2021    Diet :  Diet Order             DIET DYS 2 Room service appropriate? Yes; Fluid consistency: Nectar Thick  Diet effective now                    Inpatient Medications  Scheduled Meds:  apixaban  5 mg Oral BID   aspirin EC  81 mg Oral Daily   buPROPion  150 mg Oral Daily   insulin aspart  0-20 Units Subcutaneous TID PC & HS   insulin aspart  3 Units Subcutaneous TID WC   insulin glargine-yfgn  12 Units Subcutaneous Daily   nicotine  21 mg Transdermal Daily   mouth rinse  15 mL Mouth Rinse 4 times per day   rosuvastatin  20 mg Oral Daily   sodium chloride flush  3 mL Intravenous Once   Continuous Infusions:    PRN Meds:.acetaminophen **OR** acetaminophen (TYLENOL) oral liquid 160 mg/5 mL **OR** acetaminophen, food thickener, mouth rinse  Antibiotics  :    Anti-infectives (From admission, onward)    None        Huey Bienenstock M.D on 09/26/2021 at 11:29 AM  To page go to [email or web address]   [facility] -  Office  [phone]  See all Orders from today for further details    Objective:   Vitals:   09/25/21 2030 09/25/21 2308 09/26/21 0336 09/26/21 0751  BP: (!) 135/95 121/85 (!) 132/93 (!) 118/90  Pulse: 80 80 87   Resp: 18  18 18 16   Temp: 97.8 F (36.6 C) 98.2 F (36.8 C) 97.8 F (36.6 C) 97.7 F (36.5 C)  TempSrc: Oral Oral Oral Oral  SpO2: 96% 96%    Weight:      Height:        Wt Readings from Last 3 Encounters:  09/15/21 87.2 kg  08/05/21 88.5 kg  12/23/20 107 kg     Intake/Output Summary (Last 24 hours) at 09/26/2021  1129 Last data filed at 09/26/2021 0900 Gross per 24 hour  Intake 120 ml  Output 600 ml  Net -480 ml    Physical Exam  Awake Alert, interactive, slow speech Symmetrical Chest wall movement, Good air movement bilaterally, CTAB RRR,No Gallops,Rubs or new Murmurs, No Parasternal Heave +ve B.Sounds, Abd Soft, No tenderness, No rebound - guarding or rigidity. No Cyanosis, Clubbing or edema, No new Rash or bruise      Data Review:    CBC Recent Labs  Lab 09/21/21 0505 09/26/21 0334  WBC 10.5 12.5*  HGB 15.2 15.8  HCT 43.1 44.8  PLT 253 258  MCV 91.1 90.9  MCH 32.1 32.0  MCHC 35.3 35.3  RDW 12.5 12.5    Electrolytes Recent Labs  Lab 09/21/21 0505 09/26/21 0334  NA 140 141  K 3.8 3.5  CL 107 106  CO2 24 26  GLUCOSE 108* 82  BUN 16 13  CREATININE 0.87 0.86  CALCIUM 9.2 9.2  MG 1.7  --     ------------------------------------------------------------------------------------------------------------------ No results for input(s): "CHOL", "HDL", "LDLCALC", "TRIG", "CHOLHDL", "LDLDIRECT" in the last 72 hours.   Lab Results  Component Value Date   HGBA1C >14 08/05/2021      Radiology Reports No results found.

## 2021-09-26 NOTE — Plan of Care (Signed)
  Problem: Nutritional: Goal: Maintenance of adequate nutrition will improve Outcome: Progressing   Problem: Skin Integrity: Goal: Risk for impaired skin integrity will decrease Outcome: Progressing   Problem: Activity: Goal: Risk for activity intolerance will decrease Outcome: Progressing   Problem: Coping: Goal: Level of anxiety will decrease Outcome: Progressing

## 2021-09-27 LAB — GLUCOSE, CAPILLARY
Glucose-Capillary: 91 mg/dL (ref 70–99)
Glucose-Capillary: 122 mg/dL — ABNORMAL HIGH (ref 70–99)
Glucose-Capillary: 208 mg/dL — ABNORMAL HIGH (ref 70–99)
Glucose-Capillary: 144 mg/dL — ABNORMAL HIGH (ref 70–99)

## 2021-09-27 NOTE — Progress Notes (Signed)
Occupational Therapy Treatment Patient Details Name: Martin Mullen MRN: [id number] DOB: February 17, 1980 Today's Date: 09/27/2021   History of present illness Pt is a 41 y/o M presenting to ED on 7/29 with slurred speech, MRI revealing 51mm acute inschemic non-hemorrhagic L ventral pons/midbrain infarct. CTA head and neck with no large vessel occlusion. PMH significant for STEMI s/p RCA stent 2014, HTN, recurrent strokes, insulin-dependent DM2, hyperlipidemia, substance use.   OT comments  Pt progressing towards goals, able to complete seated/standing ADLs, with min-mod A this session, walking room distance with RW with x1 LOB to R side needing mod A to recorrect. Pt demonstrating improved fine motor skills, able to tie strings of gown in a knot prior to getting OOB. Improving cognition, pt aware/able to recall he cannot wear hospital socks with his shoes only TED stockings. Pt presenting with impairments listed below, will follow acutely. Continue to recommend AIR at d/c.   Recommendations for follow up therapy are one component of a multi-disciplinary discharge planning process, led by the attending physician.  Recommendations may be updated based on patient status, additional functional criteria and insurance authorization.    Follow Up Recommendations  Acute inpatient rehab (3hours/day)    Assistance Recommended at Discharge Frequent or constant Supervision/Assistance  Patient can return home with the following  A lot of help with walking and/or transfers;A lot of help with bathing/dressing/bathroom;Assistance with cooking/housework;Direct supervision/assist for medications management;Direct supervision/assist for financial management;Assist for transportation;Help with stairs or ramp for entrance;Assistance with feeding   Equipment Recommendations  BSC/3in1;Wheelchair (measurements OT);Wheelchair cushion (measurements OT)    Recommendations for Other Services Rehab consult;PT consult     Precautions / Restrictions Precautions Precautions: Fall Precaution Comments: tendency to R knee buckle and asymptomatic orthostatic hypotension Required Braces or Orthoses: Other Brace Other Brace: RLE custom AFO in room with shoes Restrictions Weight Bearing Restrictions: No       Mobility Bed Mobility Overal bed mobility: Needs Assistance     Sidelying to sit: Min guard            Transfers Overall transfer level: Needs assistance Equipment used: Rolling walker (2 wheels) Transfers: Sit to/from Stand Sit to Stand: Min assist                 Balance Overall balance assessment: Needs assistance Sitting-balance support: No upper extremity supported, Feet supported Sitting balance-Leahy Scale: Fair Sitting balance - Comments: R lateral lean   Standing balance support: Bilateral upper extremity supported, Reliant on assistive device for balance Standing balance-Leahy Scale: Poor Standing balance comment: Reliant on external support, and RW, sways to R with walking in room                           ADL either performed or assessed with clinical judgement   ADL Overall ADL's : Needs assistance/impaired     Grooming: Minimal assistance;Standing Grooming Details (indicate cue type and reason): washing hair at sink             Lower Body Dressing: Moderate assistance Lower Body Dressing Details (indicate cue type and reason): to don R shoe with AFO Toilet Transfer: Minimal assistance;+2 for safety/equipment;Rolling walker (2 wheels);Ambulation;Regular Teacher, adult education Details (indicate cue type and reason): simulated in room         Functional mobility during ADLs: Minimal assistance;Rolling walker (2 wheels)      Extremity/Trunk Assessment Upper Extremity Assessment Upper Extremity Assessment: Generalized weakness   Lower Extremity Assessment  Lower Extremity Assessment: Defer to PT evaluation        Vision   Vision  Assessment?: No apparent visual deficits   Perception Perception Perception: Impaired (mild R inattention)   Praxis Praxis Praxis: Not tested    Cognition Arousal/Alertness: Awake/alert Behavior During Therapy: Flat affect Overall Cognitive Status: Impaired/Different from baseline Area of Impairment: Problem solving                   Current Attention Level: Sustained   Following Commands: Follows one step commands with increased time Safety/Judgement: Decreased awareness of safety Awareness: Anticipatory Problem Solving: Slow processing, Requires verbal cues General Comments: Pt cooperative and eager to work on ambulation. Pt not able to indicate when he is fatigued.        Exercises      Shoulder Instructions       General Comments VSS on RA    Pertinent Vitals/ Pain       Pain Assessment Pain Assessment: No/denies pain  Home Living                                          Prior Functioning/Environment              Frequency  Min 3X/week        Progress Toward Goals  OT Goals(current goals can now be found in the care plan section)  Progress towards OT goals: Progressing toward goals  Acute Rehab OT Goals Patient Stated Goal: to get lunch OT Goal Formulation: With patient Time For Goal Achievement: 09/26/21 Potential to Achieve Goals: Good ADL Goals Pt Will Perform Upper Body Dressing: with min assist;sitting Pt Will Perform Lower Body Dressing: with min assist;sitting/lateral leans;sit to/from stand Pt Will Transfer to Toilet: with min assist;regular height toilet;ambulating Pt Will Perform Tub/Shower Transfer: Tub transfer;Shower transfer;rolling walker;ambulating Additional ADL Goal #1: pt will perform 3 step trailmaking task in prep for ADLs  Plan Discharge plan remains appropriate;Frequency needs to be updated    Co-evaluation                 AM-PAC OT "6 Clicks" Daily Activity     Outcome Measure    Help from another person eating meals?: A Little Help from another person taking care of personal grooming?: A Little Help from another person toileting, which includes using toliet, bedpan, or urinal?: A Little Help from another person bathing (including washing, rinsing, drying)?: A Lot Help from another person to put on and taking off regular upper body clothing?: A Little Help from another person to put on and taking off regular lower body clothing?: A Lot 6 Click Score: 16    End of Session Equipment Utilized During Treatment: Gait belt;Rolling walker (2 wheels)  OT Visit Diagnosis: Unsteadiness on feet (R26.81);Other abnormalities of gait and mobility (R26.89);Muscle weakness (generalized) (M62.81);Other symptoms and signs involving cognitive function;Cognitive communication deficit (R41.841) Symptoms and signs involving cognitive functions: Cerebral infarction   Activity Tolerance Patient tolerated treatment well   Patient Left in bed;with call bell/phone within reach;with bed alarm set   Nurse Communication Mobility status        Time: [id number] OT Time Calculation (min): 28 min  Charges: OT General Charges $OT Visit: 1 Visit OT Treatments $Self Care/Home Management : 8-22 mins $Therapeutic Activity: 8-22 mins  Saurav Crumble, OTD, OTR/L Acute Rehab [phone]   Lanelle Bal  Mariea Stable 09/27/2021, 4:21 PM

## 2021-09-27 NOTE — Plan of Care (Signed)
  Problem: Nutritional: Goal: Maintenance of adequate nutrition will improve Outcome: Progressing   Problem: Skin Integrity: Goal: Risk for impaired skin integrity will decrease Outcome: Progressing   Problem: Activity: Goal: Risk for activity intolerance will decrease Outcome: Progressing   Problem: Coping: Goal: Level of anxiety will decrease Outcome: Progressing   Problem: Education: Goal: Knowledge of disease or condition will improve Outcome: Progressing Goal: Knowledge of patient specific risk factors will improve (INDIVIDUALIZE FOR PATIENT) Outcome: Progressing Goal: Individualized Educational Video(s) Outcome: Progressing   Problem: Coping: Goal: Will verbalize positive feelings about self Outcome: Progressing Goal: Will identify appropriate support needs Outcome: Progressing   Problem: Self-Care: Goal: Ability to participate in self-care as condition permits will improve Outcome: Progressing   Problem: Nutrition: Goal: Risk of aspiration will decrease Outcome: Progressing

## 2021-09-27 NOTE — Progress Notes (Signed)
Physical Therapy Treatment Patient Details Name: Martin Mullen MRN: [id number] DOB: March 07, 1980 Today's Date: 09/27/2021   History of Present Illness Pt is a 41 y/o M presenting to ED on 7/29 with slurred speech, MRI revealing 82mm acute inschemic non-hemorrhagic L ventral pons/midbrain infarct. CTA head and neck with no large vessel occlusion. PMH significant for STEMI s/p RCA stent 2014, HTN, recurrent strokes, insulin-dependent DM2, hyperlipidemia, substance use.    PT Comments    R AFO donned during session, pt with improving R foot clearance but still struggles with clearance and knee hyperextension when fatigued requiring verbal cuing to correct. Pt ambulatory for hallway distance with use of RW, easily distracted and occasionally requires steadying assist when distracted. Pt progressing well, per pt today he is proud of his progress. Will continue to follow.     Recommendations for follow up therapy are one component of a multi-disciplinary discharge planning process, led by the attending physician.  Recommendations may be updated based on patient status, additional functional criteria and insurance authorization.  Follow Up Recommendations  Acute inpatient rehab (3hours/day)     Assistance Recommended at Discharge Frequent or constant Supervision/Assistance  Patient can return home with the following A lot of help with walking and/or transfers;A lot of help with bathing/dressing/bathroom;Assistance with cooking/housework;Direct supervision/assist for medications management;Direct supervision/assist for financial management;Assist for transportation;Help with stairs or ramp for entrance   Equipment Recommendations  Other (comment) (tbd)    Recommendations for Other Services Rehab consult     Precautions / Restrictions Precautions Precautions: Fall Precaution Comments: tendency to R knee buckle and asymptomatic orthostatic hypotension Required Braces or Orthoses: Other  Brace Other Brace: RLE custom AFO in room with shoes Restrictions Weight Bearing Restrictions: No     Mobility  Bed Mobility Overal bed mobility: Needs Assistance             General bed mobility comments: up in chair    Transfers Overall transfer level: Needs assistance Equipment used: Rolling walker (2 wheels) Transfers: Sit to/from Stand Sit to Stand: Min assist           General transfer comment: assist for eccentric lower back into chair, repeated sit<>stands as intervention    Ambulation/Gait Ambulation/Gait assistance: Min assist Gait Distance (Feet): 240 Feet Assistive device: Rolling walker (2 wheels) Gait Pattern/deviations: Step-through pattern, Decreased step length - right, Decreased stride length, Knee hyperextension - right, Decreased dorsiflexion - right, Knee hyperextension - left, Drifts right/left Gait velocity: decr     General Gait Details: cues for increasing R foot clearance with verbal cue "big step", upright posture, placement in RW. Pt easily distracted and RW veers when pt looking L/R or speaks with someone in hallway. Steadying assist multiple times   Stairs             Wheelchair Mobility    Modified Rankin (Stroke Patients Only) Modified Rankin (Stroke Patients Only) Pre-Morbid Rankin Score: No symptoms Modified Rankin: Moderately severe disability     Balance Overall balance assessment: Needs assistance Sitting-balance support: No upper extremity supported, Feet supported Sitting balance-Leahy Scale: Fair Sitting balance - Comments: R lateral lean   Standing balance support: Bilateral upper extremity supported, Reliant on assistive device for balance Standing balance-Leahy Scale: Poor Standing balance comment: Reliant on external supportroom                            Cognition Arousal/Alertness: Awake/alert Behavior During Therapy: Flat affect Overall Cognitive Status:  Impaired/Different from  baseline Area of Impairment: Problem solving, Attention, Following commands, Safety/judgement                   Current Attention Level: Sustained   Following Commands: Follows one step commands with increased time Safety/Judgement: Decreased awareness of safety, Decreased awareness of deficits Awareness: Emergent Problem Solving: Slow processing, Requires verbal cues          Exercises Other Exercises Other Exercises: STS x5 with focus on slow eccentric lower    General Comments General comments (skin integrity, edema, etc.): VSS on RA      Pertinent Vitals/Pain Pain Assessment Pain Assessment: No/denies pain    Home Living                          Prior Function            PT Goals (current goals can now be found in the care plan section) Acute Rehab PT Goals Patient Stated Goal: to get home and see my kids PT Goal Formulation: With patient Time For Goal Achievement: 10/11/21 Potential to Achieve Goals: Good Progress towards PT goals: Progressing toward goals    Frequency    Min 4X/week      PT Plan Current plan remains appropriate    Co-evaluation              AM-PAC PT "6 Clicks" Mobility   Outcome Measure  Help needed turning from your back to your side while in a flat bed without using bedrails?: A Little Help needed moving from lying on your back to sitting on the side of a flat bed without using bedrails?: A Little Help needed moving to and from a bed to a chair (including a wheelchair)?: A Lot Help needed standing up from a chair using your arms (e.g., wheelchair or bedside chair)?: A Little Help needed to walk in hospital room?: A Lot Help needed climbing 3-5 steps with a railing? : A Lot 6 Click Score: 15    End of Session Equipment Utilized During Treatment: Gait belt Activity Tolerance: Patient tolerated treatment well Patient left: in chair;with call bell/phone within reach;with chair alarm set Nurse  Communication: Mobility status PT Visit Diagnosis: Other abnormalities of gait and mobility (R26.89);Other symptoms and signs involving the nervous system (R29.898)     Time: 1530-1550 PT Time Calculation (min) (ACUTE ONLY): 20 min  Charges:  $Gait Training: 8-22 mins           Marye Round, PT DPT Acute Rehabilitation Services Pager [phone]  Office [phone]    Martin Mullen 09/27/2021, 4:31 PM

## 2021-09-27 NOTE — Progress Notes (Signed)
PROGRESS NOTE                                                                                                                                                                                                             Patient Demographics:    Martin Mullen, is a 41 y.o. male, DOB - 12/03/1980, [id number]  Outpatient Primary MD for the patient is [name], NP    LOS - 15  Admit date - 09/11/2021    Chief Complaint  Patient presents with   Code Stroke       Brief Narrative (HPI from H&P)  -  41 y.o. 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    MRI brain was revealing for 6 mm acute ischemic nonhemorrhagic left ventral pons/midbrain infarct.  CTA head and neck with no large vessel occlusion.   Subjective:   No significant events overnight as discussed with staff, patient denies any complaints.   Assessment  & Plan :    Stroke due to noncompliance with his medications for several weeks    - 6 mm acute ischemic non-hemorrhagic left ventral pons/midbrain Infarct.  He has history of previous stroke, he also has history of prothrombin gene mutation, history of noncompliance with medications.  He does seem to have an acute stroke now, confirms that he was not taking any medications for 3 weeks prior to this hospital admission, counseled on compliance, currently on combination of aspirin and Eliquis, Crestor and insulin regimen also resumed.  A1c is greater than 14 LDL above goal. Echo stable full stroke work-up done.  Await placement to SNF/CIR.  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  They advise keeping on Eliquis indefinitely.  HTN  (hypertension) stable off of medications.  STEMI s/p RCA stent 08/2012 - Asymptomatic from a cardiac standpoint.  Continue Eliquis and aspirin combination along with statin for secondary prevention, ARB held to allow for permissive hypertension..  Dehydration with hypokalemia.  Potassium replaced, continue as needed hydration as needed with IV fluids.  Type 2 diabetes mellitus with hyperlipidemia - poor control in the outpatient setting as evident by the extremely  high A1c, he was not taking any medications for 3 weeks, counseled on compliance, placed on long-acting insulin and sliding scale.  Monitor and adjust  CBG (last 3)  Recent Labs    09/26/21 2115 09/27/21 0738 09/27/21 1139  GLUCAP 141* 91 122*   Lab Results  Component Value Date   HGBA1C >14 08/05/2021         Condition - Extremely Guarded  Family Communication  :  friend Wyatt Mage - [phone]  - on 09/12/21, updated again on 09/18/2021 she is now the POA.  Code Status :  Full  Consults  :  Neuro  PUD Prophylaxis : Eliquis   Procedures  :     MRI - 1. 6 mm acute ischemic nonhemorrhagic left ventral pons/midbrain infarct. 2. Underlying chronic microvascular ischemic disease with multiple remote lacunar infarcts involving the right corona radiata, basal ganglia, thalami, and pons.  CTA - 1. No acute intracranial abnormality or significant interval change. 2. Stable atrophy and diffuse white matter disease. 3. Remote lacunar infarcts involving the internal capsule bilaterally and left thalamus. 4. Aspects 10/10   TTE - 1. Left ventricular ejection fraction, by estimation, is 60 to 65%. The left ventricle has normal function. The left ventricle has no regional wall motion abnormalities. Left ventricular diastolic parameters were normal.  2. Right ventricular systolic function is normal. The right ventricular size is normal.  3. The mitral valve is normal in structure. No evidence of mitral valve regurgitation.  4. The  aortic valve was not well visualized. Aortic valve regurgitation is not visualized.  5. IVC not well visualized.       Disposition Plan  : Patient is stable for discharge , awaiting safe discharge disposition and SNF bed availability.  Status is: Inpatient  DVT Prophylaxis  :    Place TED hose Start: 09/16/21 1807 apixaban (ELIQUIS) tablet 5 mg   Lab Results  Component Value Date   PLT 258 09/26/2021    Diet :  Diet Order             DIET DYS 2 Room service appropriate? Yes; Fluid consistency: Nectar Thick  Diet effective now                    Inpatient Medications  Scheduled Meds:  apixaban  5 mg Oral BID   aspirin EC  81 mg Oral Daily   buPROPion  150 mg Oral Daily   insulin aspart  0-20 Units Subcutaneous TID PC & HS   insulin aspart  3 Units Subcutaneous TID WC   insulin glargine-yfgn  12 Units Subcutaneous Daily   nicotine  21 mg Transdermal Daily   mouth rinse  15 mL Mouth Rinse 4 times per day   rosuvastatin  20 mg Oral Daily   sodium chloride flush  3 mL Intravenous Once   Continuous Infusions:    PRN Meds:.acetaminophen **OR** acetaminophen (TYLENOL) oral liquid 160 mg/5 mL **OR** acetaminophen, food thickener, mouth rinse  Antibiotics  :    Anti-infectives (From admission, onward)    None        Huey Bienenstock M.D on 09/27/2021 at 12:33 PM  To page go to [email or web address]   [facility] -  Office  [phone]  See all Orders from today for further details    Objective:   Vitals:   09/26/21 2309 09/27/21 0345 09/27/21 0739 09/27/21 1140  BP: (!) 118/90 119/85 130/85 101/74  Pulse: 91 82 78 87  Resp: 18 18 18  Temp: 98.1 F (36.7 C) 97.8 F (36.6 C) 98.1 F (36.7 C) 98.1 F (36.7 C)  TempSrc: Oral Oral Oral Oral  SpO2: 97% 97% 100% 97%  Weight:      Height:        Wt Readings from Last 3 Encounters:  09/15/21 87.2 kg  08/05/21 88.5 kg  12/23/20 107 kg     Intake/Output Summary (Last 24 hours) at 09/27/2021  1233 Last data filed at 09/26/2021 1401 Gross per 24 hour  Intake --  Output 400 ml  Net -400 ml    Physical Exam  Awake Alert, interactive, slow speech Symmetrical Chest wall movement, Good air movement bilaterally, CTAB RRR,No Gallops,Rubs or new Murmurs, No Parasternal Heave +ve B.Sounds, Abd Soft, No tenderness, No rebound - guarding or rigidity. No Cyanosis, Clubbing or edema, No new Rash or bruise       Data Review:    CBC Recent Labs  Lab 09/21/21 0505 09/26/21 0334  WBC 10.5 12.5*  HGB 15.2 15.8  HCT 43.1 44.8  PLT 253 258  MCV 91.1 90.9  MCH 32.1 32.0  MCHC 35.3 35.3  RDW 12.5 12.5    Electrolytes Recent Labs  Lab 09/21/21 0505 09/26/21 0334  NA 140 141  K 3.8 3.5  CL 107 106  CO2 24 26  GLUCOSE 108* 82  BUN 16 13  CREATININE 0.87 0.86  CALCIUM 9.2 9.2  MG 1.7  --     ------------------------------------------------------------------------------------------------------------------ No results for input(s): "CHOL", "HDL", "LDLCALC", "TRIG", "CHOLHDL", "LDLDIRECT" in the last 72 hours.   Lab Results  Component Value Date   HGBA1C >14 08/05/2021      Radiology Reports No results found.

## 2021-09-28 ENCOUNTER — Other Ambulatory Visit (HOSPITAL_COMMUNITY): Payer: Self-pay

## 2021-09-28 LAB — GLUCOSE, CAPILLARY
Glucose-Capillary: 129 mg/dL — ABNORMAL HIGH (ref 70–99)
Glucose-Capillary: 170 mg/dL — ABNORMAL HIGH (ref 70–99)
Glucose-Capillary: 147 mg/dL — ABNORMAL HIGH (ref 70–99)

## 2021-09-28 MED ORDER — ADULT MULTIVITAMIN W/MINERALS CH
1.0000 | ORAL_TABLET | Freq: Every day | ORAL | 0 refills | Status: DC
  Filled 2021-09-28: qty 30, 30d supply, fill #0

## 2021-09-28 MED ORDER — FOOD THICKENER (SIMPLYTHICK)
10.0000 | ORAL | 0 refills | Status: DC | PRN
  Filled 2021-09-28: qty 100, fill #0

## 2021-09-28 MED ORDER — INSULIN GLARGINE-YFGN 100 UNIT/ML ~~LOC~~ SOPN
12.0000 [IU] | PEN_INJECTOR | Freq: Every day | SUBCUTANEOUS | 0 refills | Status: DC
  Filled 2021-09-28: qty 3, 25d supply, fill #0

## 2021-09-28 MED ORDER — ASPIRIN 81 MG PO TBEC: 81.0000 mg | DELAYED_RELEASE_TABLET | Freq: Every day | ORAL | 0 refills | Status: DC

## 2021-09-28 MED ORDER — ACCU-CHEK GUIDE W/DEVICE KIT
PACK | 0 refills | Status: DC
  Filled 2021-09-28: qty 1, 30d supply, fill #0

## 2021-09-28 MED ORDER — BUPROPION HCL ER (XL) 150 MG PO TB24
150.0000 mg | ORAL_TABLET | Freq: Every day | ORAL | 0 refills | Status: DC
  Filled 2021-09-28: qty 30, 30d supply, fill #0

## 2021-09-28 MED ORDER — ACETAMINOPHEN 325 MG PO TABS: 650.0000 mg | ORAL_TABLET | ORAL | Status: DC | PRN

## 2021-09-28 MED ORDER — ACCU-CHEK SOFTCLIX LANCETS MISC
0 refills | Status: DC
  Filled 2021-09-28: qty 100, 30d supply, fill #0

## 2021-09-28 MED ORDER — GLUCOSE BLOOD VI STRP
ORAL_STRIP | 0 refills | Status: DC
  Filled 2021-09-28: qty 100, 30d supply, fill #0

## 2021-09-28 MED ORDER — METFORMIN HCL 1000 MG PO TABS
1000.0000 mg | ORAL_TABLET | Freq: Two times a day (BID) | ORAL | 0 refills | Status: DC
  Filled 2021-09-28: qty 60, 30d supply, fill #0

## 2021-09-28 MED ORDER — ROSUVASTATIN CALCIUM 20 MG PO TABS
20.0000 mg | ORAL_TABLET | Freq: Every day | ORAL | 0 refills | Status: DC
  Filled 2021-09-28: qty 30, 30d supply, fill #0

## 2021-09-28 MED ORDER — APIXABAN 5 MG PO TABS
5.0000 mg | ORAL_TABLET | Freq: Two times a day (BID) | ORAL | 0 refills | Status: DC
  Filled 2021-09-28: qty 60, 30d supply, fill #0

## 2021-09-28 MED ORDER — INSULIN PEN NEEDLE 32G X 4 MM MISC
0 refills | Status: DC
  Filled 2021-09-28: qty 100, 30d supply, fill #0

## 2021-09-28 NOTE — Progress Notes (Signed)
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 CTA head and neck with no large vessel occlusion. PMH significant for STEMI s/p RCA stent 2014, HTN, recurrent strokes, insulin-dependent DM2, hyperlipidemia, substance use.    PT Comments    Pt received in supine, agreeable to therapy session with good effort for transfer, gait and standing balance training with and without RW. Pt needing up to modA for short gait trial with unilateral hand-held assist and consistent minA and brief episodes of modA for gait using RW. Pt much steadier using RW but needs frequent cues for posture, step sequencing and activity pacing due to tendency to scissor his legs when his awareness of RLE decreases. Pt able to perform R foot taps x30 reps on 7" step in room prior to needing seated break. Pt continues to benefit from PT services to progress toward functional mobility goals. Pt would most benefit from short term high intensity post-acute rehab and is making good progress toward his goals.  Recommendations for follow up therapy are one component of a multi-disciplinary discharge planning process, led by the attending physician.  Recommendations may be updated based on patient status, additional functional criteria and insurance authorization.  Follow Up Recommendations  Acute inpatient rehab (3hours/day)     Assistance Recommended at Discharge Frequent or constant Supervision/Assistance  Patient can return home with the following A lot of help with walking and/or transfers;A lot of help with bathing/dressing/bathroom;Assistance with cooking/housework;Direct supervision/assist for medications management;Direct supervision/assist for financial management;Assist for  transportation;Help with stairs or ramp for entrance   Equipment Recommendations  Other (comment) (TBD)    Recommendations for Other Services Rehab consult     Precautions / Restrictions Precautions Precautions: Fall Precaution Comments: decreased R awareness/knee buckling Required Braces or Orthoses: Other Brace Other Brace: RLE custom AFO in room with shoes Restrictions Weight Bearing Restrictions: No     Mobility  Bed Mobility Overal bed mobility: Needs Assistance Bed Mobility: Rolling, Sidelying to Sit Rolling: Modified independent (Device/Increase time) Sidelying to sit: Supervision       General bed mobility comments: increased time to perform assist with line mgmt; to R side    Transfers Overall transfer level: Needs assistance Equipment used: Rolling walker (2 wheels) Transfers: Sit to/from Stand Sit to Stand: Min guard, Min assist           General transfer comment: assist for eccentric lower back into chair, able to stand to/from chair without arms x2 reps, safer to sit on to chair with arm rests    Ambulation/Gait Ambulation/Gait assistance: Min assist, Mod assist Gait Distance (Feet): 120 Feet (159ft, seated break, 67ft) Assistive device: Rolling walker (2 wheels), 1 person hand held assist Gait Pattern/deviations: Step-through pattern, Decreased step length - right, Decreased stride length, Knee hyperextension - right, Decreased dorsiflexion - right, Knee hyperextension - left, Drifts right/left Gait velocity: decr     General Gait Details: cues for increasing R foot clearance with verbal cue "big step", upright posture, placement in RW. Pt easily distracted and RW veers when pt looking L/R or speaks with someone in hallway. Steadying assist multiple times needing up to modA, but otherwise minA with RW. Pt needing mostly modA with HHA for ambulation in room without RW   Modified Rankin (Stroke Patients Only) Modified Rankin (  Stroke Patients  Only) Pre-Morbid Rankin Score: No symptoms Modified Rankin: Moderately severe disability     Balance Overall balance assessment: Needs assistance Sitting-balance support: No upper extremity supported, Feet supported Sitting balance-Leahy Scale: Good     Standing balance support: Bilateral upper extremity supported, Reliant on assistive device for balance Standing balance-Leahy Scale: Poor Standing balance comment: Reliant on external support and RW                            Cognition Arousal/Alertness: Awake/alert Behavior During Therapy: Flat affect Overall Cognitive Status: Impaired/Different from baseline Area of Impairment: Problem solving, Attention, Following commands, Safety/judgement                   Current Attention Level: Sustained   Following Commands: Follows one step commands with increased time Safety/Judgement: Decreased awareness of safety, Decreased awareness of deficits Awareness: Emergent Problem Solving: Slow processing, Requires verbal cues General Comments: Pt cooperative and eager to work on ambulation and standing balance tasks.        Exercises Other Exercises Other Exercises: standing foot taps  with RLE anterior and laterally on 7" step with only RUE support x15 reps ea direction    General Comments General comments (skin integrity, edema, etc.): BP 113/84 sitting EOB, then BP 123/91 seated in recliner post-exertion; VSS on RA      Pertinent Vitals/Pain Pain Assessment Pain Assessment: No/denies pain Faces Pain Scale: No hurt           PT Goals (current goals can now be found in the care plan section) Acute Rehab PT Goals Patient Stated Goal: to get home and see my kids PT Goal Formulation: With patient Time For Goal Achievement: 10/11/21 Progress towards PT goals: Progressing toward goals    Frequency    Min 4X/week      PT Plan Current plan remains appropriate       AM-PAC PT "6 Clicks" Mobility    Outcome Measure  Help needed turning from your back to your side while in a flat bed without using bedrails?: None Help needed moving from lying on your back to sitting on the side of a flat bed without using bedrails?: A Little Help needed moving to and from a bed to a chair (including a wheelchair)?: A Lot Help needed standing up from a chair using your arms (e.g., wheelchair or bedside chair)?: A Little Help needed to walk in hospital room?: A Lot Help needed climbing 3-5 steps with a railing? : A Lot 6 Click Score: 16    End of Session Equipment Utilized During Treatment: Gait belt Activity Tolerance: Patient tolerated treatment well Patient left: in chair;with call bell/phone within reach;with chair alarm set Nurse Communication: Mobility status;Other (comment) (pt can ambulate to bathroom with gait belt and +1 assist if AFO is donned (NOT on his own!)) PT Visit Diagnosis: Other abnormalities of gait and mobility (R26.89);Other symptoms and signs involving the nervous system (R29.898)     Time: 1610-9604 PT Time Calculation (min) (ACUTE ONLY): 30 min  Charges:  $Gait Training: 8-22 mins $Therapeutic Exercise: 8-22 mins                     Nekoda Chock P., PTA Acute Rehabilitation Services Secure Chat Preferred 9a-5:30pm Office: [phone]    Dorathy Kinsman [facility] 09/28/2021, 4:51 PM

## 2021-09-28 NOTE — Progress Notes (Signed)
Hernan Corrinne Eagle to be D/C'd Home per MD order.  Discussed with the patient and all questions fully answered.  VSS, Skin clean, dry and intact without evidence of skin break down, no evidence of skin tears noted. IV catheter discontinued intact. Site without signs and symptoms of complications. Dressing and pressure applied.  An After Visit Summary was printed and given to the patient. Patient received prescriptions from [facility] pharmacy. 3-in-1 and rolling walker delivered to room.  D/c education completed with patient/family including follow up instructions, medication list, d/c activities limitations if indicated, with other d/c instructions as indicated by MD - patient able to verbalize understanding, all questions fully answered.   Patient instructed to return to ED, call 911, or call MD for any changes in condition.   Patient escorted via WC, and D/C home via private auto.  Pauletta Browns 09/28/2021 6:27 PM

## 2021-09-28 NOTE — TOC Progression Note (Addendum)
Transition of Care [facility]) - Progression Note    Patient Details  Name: Martin Mullen MRN: [id number] Date of Birth: 1981-01-11  Transition of Care [facility]) CM/SW Contact  Mearl Latin, LCSW Phone Number: 09/28/2021, 10:17 AM  Clinical Narrative:    Patient continues with no SNF bed offers. CSW notes his continued progression with therapies.    Expected Discharge Plan: Skilled Nursing Facility Barriers to Discharge: Inadequate or no insurance, SNF Pending payor source - LOG, SNF Pending bed offer  Expected Discharge Plan and Services Expected Discharge Plan: Skilled Nursing Facility In-house Referral: Clinical Social Work   Post Acute Care Choice: Skilled Nursing Facility Living arrangements for the past 2 months: Mobile Home                                       Social Determinants of Health (SDOH) Interventions    Readmission Risk Interventions     No data to display

## 2021-09-28 NOTE — Progress Notes (Signed)
Patient has 3-in-1, rolling walker, all TOC medications, discharge paperwork and is dressed and ready to be picked up. Awaiting ride.

## 2021-09-28 NOTE — TOC Transition Note (Signed)
Transition of Care [facility]) - CM/SW Discharge Note   Patient Details  Name: Martin Mullen MRN: [id number] Date of Birth: 11/02/1980  [facility]) CM/SW Contact:  Lawerance Sabal, RN Phone Number: 09/28/2021, 4:08 PM   Clinical Narrative:    Discussed with patient, room mate Martin Mullen and Martin Mullen that patient is ambulatory and would no longer require SNF placement. Patient states that he will go home to [address] [place] [address] to stay with room mate Martin Mullen [phone]).  Requested HH PT OT SLP from [facility] provider [phone] acceptance is currently pending. This is the only available [facility] provider at this time. DME RW and 3/1 ordered to the room. Wyatt Mullen will be coming to provide ride home a little after 5pm    Final next level of care: Home w Home Health Services Barriers to Discharge: No Barriers Identified   Patient Goals and CMS Choice Patient states their goals for this hospitalization and ongoing recovery are:: home CMS Medicare.gov Compare Post Acute Care list provided to:: Patient Choice offered to / list presented to : Patient (Girlfriend)  Discharge Placement                       Discharge Plan and Services In-house Referral: Clinical Social Work   Post Acute Care Choice: Skilled Nursing Facility          DME Arranged: Dan Humphreys rolling, 3-N-1 DME Agency: [employer] Date DME Agency Contacted: 09/28/21 Time DME Agency Contacted: [phone] Representative spoke with at DME Agency: Arnold Long HH Arranged: PT, OT, Speech Therapy HH Agency: [facility] Provider) Date [facility] Agency Contacted: 09/28/21 Time HH Agency Contacted: [id number] Representative spoke with at [facility] Agency: Amy (charity provider)  Social Determinants of Health (SDOH) Interventions     Readmission Risk Interventions     No data to display

## 2021-09-28 NOTE — Plan of Care (Signed)
  Problem: Coping: Goal: Ability to adjust to condition or change in health will improve Outcome: Progressing   Problem: Skin Integrity: Goal: Risk for impaired skin integrity will decrease Outcome: Progressing   Problem: Education: Goal: Knowledge of General Education information will improve Description: Including pain rating scale, medication(s)/side effects and non-pharmacologic comfort measures Outcome: Progressing   Problem: Activity: Goal: Risk for activity intolerance will decrease Outcome: Progressing   Problem: Nutrition: Goal: Adequate nutrition will be maintained Outcome: Progressing   Problem: Coping: Goal: Level of anxiety will decrease Outcome: Progressing   Problem: Elimination: Goal: Will not experience complications related to urinary retention Outcome: Progressing

## 2021-09-28 NOTE — Discharge Summary (Signed)
Physician Discharge Summary  Martin Mullen IFO:[id number] DOB: Jun 28, 1980 DOA: 09/11/2021  PCP: [name], NP  Admit date: 09/11/2021 Discharge date: 09/28/2021  Admitted From: Home Disposition:  Home   Recommendations for Outpatient Follow-up:  Follow up with PCP in 1-2 weeks Please obtain BMP/CBC in one week  Home Health:YES   Discharge Condition:Stable CODE STATUS:FULL Diet recommendation: Dysphagia 2 with thick liquid  Brief/Interim Summary:  -  41 y.o. 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    MRI brain was revealing for 6 mm acute ischemic nonhemorrhagic left ventral pons/midbrain infarct.  CTA head and neck with no large vessel occlusion.    Stroke due to noncompliance with his medications for several weeks    - 6 mm acute ischemic non-hemorrhagic left ventral pons/midbrain Infarct.  He has history of previous stroke, he also has history of prothrombin gene mutation, history of noncompliance with medications.  He does seem to have an acute stroke now, confirms that he was not taking any medications for 3 weeks prior to this hospital admission, counseled on compliance, currently on combination of aspirin and Eliquis, Crestor and insulin regimen also resumed.  A1c is greater than 14 LDL above goal. Echo stable full stroke work-up done.     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  They advise keeping on Eliquis indefinitely.   HTN (hypertension) stable off of medications., antihypertensive medications has been held to allow for permissive hypertension, but overall blood pressure has been acceptable to soft during hospital stay without any meds, so all medications for blood pressure has  been discontinued on discharge   Dehydration with hypokalemia.   resolved   STEMI s/p RCA stent 08/2012 - Asymptomatic from a cardiac standpoint.  Continue Eliquis and aspirin combination along with statin for secondary prevention, antihypertensive medications has been held to allow for permissive hypertension, but overall blood pressure has been acceptable to soft during hospital stay without any meds, so all medications for blood pressure has been discontinued on discharge   Dehydration with hypokalemia.   -Resolved   Type 2 diabetes mellitus with hyperlipidemia - poor control in the outpatient setting as evident by the extremely high A1c, > 14, he was not taking any medications for 3 weeks, counseled on compliance, placed on long-acting insulin and sliding scale.  Monitor and adjust, patient was provided by metformin, and Semglee by pharmacy at time of discharge  Patient medications was dispensed by [facility] at time of discharge   Discharge Diagnoses:  Principal Problem:   Stroke [facility]) Active Problems:   STEMI s/p RCA stent 08/2012   HTN (hypertension)   Type 2 diabetes mellitus with hyperlipidemia ([place])   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 [facility])    Discharge Instructions  Discharge Instructions     Ambulatory referral to Neurology   Complete by: As directed    An appointment is requested in approximately: 4 weeks   Diet - low sodium heart healthy   Complete by: As directed    Discharge instructions   Complete by: As directed    Follow with Primary MD [name], NP in 7 days   Get CBC, CMP,  checked  by Primary MD next visit.    Activity: As tolerated with Full fall precautions  use walker/cane & assistance as needed   Disposition Home    Diet: Dysphagia 2 with nectar thick   On your next visit with your primary care physician please Get Medicines reviewed and adjusted.   Please request your Prim.MD to go over all Hospital Tests and  Procedure/Radiological results at the follow up, please get all Hospital records sent to your Prim MD by signing hospital release before you go home.   If you experience worsening of your admission symptoms, develop shortness of breath, life threatening emergency, suicidal or homicidal thoughts you must seek medical attention immediately by calling 911 or calling your MD immediately  if symptoms less severe.  You Must read complete instructions/literature along with all the possible adverse reactions/side effects for all the Medicines you take and that have been prescribed to you. Take any new Medicines after you have completely understood and accpet all the possible adverse reactions/side effects.   Do not drive, operating heavy machinery, perform activities at heights, swimming or participation in water activities or provide baby sitting services if your were admitted for syncope or siezures until you have seen by Primary MD or a Neurologist and advised to do so again.  Do not drive when taking Pain medications.    Do not take more than prescribed Pain, Sleep and Anxiety Medications  Special Instructions: If you have smoked or chewed Tobacco  in the last 2 yrs please stop smoking, stop any regular Alcohol  and or any Recreational drug use.  Wear Seat belts while driving.   Please note  You were cared for by a hospitalist during your hospital stay. If you have any questions about your discharge medications or the care you received while you were in the hospital after you are discharged, you can call the unit and asked to speak with the hospitalist on call if the hospitalist that took care of you is not available. Once you are discharged, your primary care physician will handle any further medical issues. Please note that NO REFILLS for any discharge medications will be authorized once you are discharged, as it is imperative that you return to your primary care physician (or establish a  relationship with a primary care physician if you do not have one) for your aftercare needs so that they can reassess your need for medications and monitor your lab values.   Increase activity slowly   Complete by: As directed       Allergies as of 09/28/2021   No Known Allergies      Medication List     STOP taking these medications    Fish Oil 1200 MG Caps   Lantus SoloStar 100 UNIT/ML Solostar Pen Generic drug: insulin glargine   losartan 50 MG tablet Commonly known as: COZAAR   nitroGLYCERIN 0.4 MG SL tablet Commonly known as: NITROSTAT       TAKE these medications    Accu-Chek Guide w/Device Kit use to check blood sugars up to 4 times daily. What changed: additional instructions   Accu-Chek Softclix Lancets lancets Use As Directed   acetaminophen 325 MG tablet Commonly known as: TYLENOL Take 2 tablets (650 mg total) by mouth every 4 (four) hours as needed for mild pain (or temp > 37.5 C (99.5 F)).   apixaban 5 MG Tabs tablet Commonly known as: ELIQUIS Take 1 tablet (5 mg total) by mouth 2 (two) times daily.   aspirin EC 81 MG tablet Take 1 tablet (81 mg total) by mouth daily.  buPROPion 150 MG 24 hr tablet Commonly known as: Wellbutrin XL Take 1 tablet (150 mg total) by mouth daily.   Contour Next Test test strip Generic drug: glucose blood USE 1 STRIP TO CHECK GLUCOSE THREE TIMES DAILY AS NEEDED What changed: Another medication with the same name was added. Make sure you understand how and when to take each.   glucose blood test strip Use As Directed What changed: You were already taking a medication with the same name, and this prescription was added. Make sure you understand how and when to take each.   food thickener Gel Commonly known as: SIMPLYTHICK (NECTAR/LEVEL 2/MILDLY THICK) Take 10 packets by mouth as needed.   insulin glargine-yfgn 100 UNIT/ML Pen Commonly known as: SEMGLEE Inject 12 Units into the skin daily.   Insulin Pen  Needle 32G X 4 MM Misc Use As Directed   metFORMIN 1000 MG tablet Commonly known as: GLUCOPHAGE Take 1 tablet (1,000 mg total) by mouth 2 (two) times daily with a meal. TAKE 1 TABLET(1000 MG) BY MOUTH TWICE DAILY WITH A MEAL What changed:  how much to take how to take this when to take this   multivitamin with minerals Tabs tablet Take 1 tablet by mouth daily. One-A-Day Multivitamin What changed: when to take this   rosuvastatin 20 MG tablet Commonly known as: CRESTOR Take 1 tablet (20 mg total) by mouth daily. Start taking on: September 29, 2021 What changed:  medication strength how much to take               Durable Medical Equipment  (From admission, onward)           Start     Ordered   09/28/21 1607  For home use only DME 3 n 1  Once        09/28/21 1606   09/28/21 1600  For home use only DME Walker rolling  Once       Question Answer Comment  Walker: With 5 Inch Wheels   Patient needs a walker to treat with the following condition Weakness      09/28/21 1600            No Known Allergies  Consultations: Neurology   Procedures/Studies: DG Swallowing Func-Speech Pathology  Result Date: 09/12/2021 Table formatting from the original result was not included. Objective Swallowing Evaluation: Type of Study: MBS-Modified Barium Swallow Study  Patient Details Name: Martin Mullen MRN: [id number] Date of Birth: 25-Feb-1980 Today's Date: 09/12/2021 Time: SLP Start Time (ACUTE ONLY): 4098 -SLP Stop Time (ACUTE ONLY): 1505 SLP Time Calculation (min) (ACUTE ONLY): 20 min Past Medical History: Past Medical History: Diagnosis Date  Acute transmural inferior wall MI ([place]) 08/28/2012  .5 x 12 mm Veri-flex stent non-DES.  Chicken pox   Coronary artery disease   Inferior ST elevation myocardial infarction in July of 2014. Cardiac catheterization showed 95% distal RCA stenosis and 90% first diagonal stenosis with normal ejection fraction. He underwent PCI in bare-metal  stent placement to the distal RCA.  Diabetes mellitus without complication ([place])   Hypercholesterolemia   Stroke ([place])   Tobacco use  Past Surgical History: Past Surgical History: Procedure Laterality Date  CORONARY ANGIOPLASTY WITH STENT PLACEMENT    DEBRIDEMENT AND CLOSURE WOUND N/A 09/03/2019  Procedure: Debridement and closure of scrotal wound;  Surgeon: Cindra Presume, MD;  Location: [facility];  Service: Plastics;  Laterality: N/A;  DEBRIDEMENT AND CLOSURE WOUND N/A 07/25/2019  Procedure: DEBRIDEMENT AND CLOSURE  WOUND;  Surgeon: Cindra Presume, MD;  Location: [facility];  Service: Plastics;  Laterality: N/A;  INCISION AND DRAINAGE ABSCESS N/A 07/20/2019  Procedure: INCISION AND DRAINAGE ABSCESS;  Surgeon: Lucas Mallow, MD;  Location: [facility];  Service: Urology;  Laterality: N/A;  LEFT HEART CATHETERIZATION WITH CORONARY ANGIOGRAM N/A 08/28/2012  Procedure: LEFT HEART CATHETERIZATION WITH CORONARY ANGIOGRAM;  Surgeon: Laverda Page, MD;  Location: [facility] CATH LAB;  Service: Cardiovascular;  Laterality: N/A;  TEE WITHOUT CARDIOVERSION N/A 09/23/2020  Procedure: TRANSESOPHAGEAL ECHOCARDIOGRAM (TEE);  Surgeon: Corey Skains, MD;  Location: [facility] OR[facility];  Service: Cardiovascular;  Laterality: N/A;  WRIST SURGERY  1988 HPI: Patient is a 41 y.o. male with PMH: recurrent strokes, HTN, STEMI s/p RCA stent 2014, DM-2 insulin dependent, HLD, cocaine use. He presented to the hospital on 09/11/21 with slurred speech. MRI brain revealed 21m acute ischemic nonhemmorhagic left ventral pons/midbrain infarct; CTA head and neck with no large vessel occlusion.  Subjective: awake, alert, cooperative  Recommendations for follow up therapy are one component of a multi-disciplinary discharge planning process, led by the attending physician.  Recommendations may be updated based on patient status, additional functional criteria and insurance authorization. Assessment / Plan / Recommendation   09/12/2021   4:00 PM  Clinical Impressions Clinical Impression Patient presents with mild-mod oral phase and mild pharyngeal phase dysphagia as per this MBS.  During oral phase, patient exhibited brief holding of liquid boluses as he was observed to impulsively try to pour all of barium from cup into mouth before initiating a swallow. He also exhibited delays in anterior to posterior transit of puree solids and mechanical soft solids. With thin liquids, patient exhibited swallow intiation delays mainly at level of pyriform sinus but at times swallow was initiated at level of vallecular sinus. He exhibited fairly consistent flash penetration of thin liquids (PAS 2)  but with full clearance from laryngeal vestibule each time and no significant pharyngeal residuals post initial swallow. With nectar thick liquids and puree solids he exhibited swallow initiation delay at level of vallecular sinus but only one instance of flash penetration with nectar thick liquids. He did exhibit one instance of sensed, trace aspiration which occured during the swallow when patient took sip of thin liquid barium while still having some masticated mechanical soft solid in his mouth. His cough was strong and appeared to result in at least some of aspirate being ejected out  after passing through vocal cords. When performing esophageal sweep with barium tablet, the tablet as well as the puree solids that were taken along with it were observed in approximately upper thoracic portion of esophagus and an additional bite of puree helped to fully transit tablet and puree throuth esophagus. SLP is recommending initiate Dys 1 (puree) solids and nectar thick liquids via cup sips (no straws) at this time. SLP will follow patient for diet toleration. SLP Visit Diagnosis Dysphagia, oropharyngeal phase (R13.12) Impact on safety and function Mild aspiration risk     09/12/2021   4:00 PM Treatment Recommendations Treatment Recommendations Therapy as outlined in treatment plan  below     09/12/2021   4:00 PM Prognosis Prognosis for Safe Diet Advancement Good   09/12/2021   4:00 PM Diet Recommendations SLP Diet Recommendations Dysphagia 1 (Puree) solids;Nectar thick liquid Liquid Administration via No straw;Cup Medication Administration Whole meds with puree Compensations Slow rate;Small sips/bites Postural Changes Seated upright at 90 degrees     09/12/2021   4:00 PM Other Recommendations Oral Care  Recommendations Oral care BID;Staff/trained caregiver to provide oral care Other Recommendations Order thickener from pharmacy;Prohibited food (jello, ice cream, thin soups);Have oral suction available;Clarify dietary restrictions Follow Up Recommendations Acute inpatient rehab (3hours/day) Assistance recommended at discharge Frequent or constant Supervision/Assistance Functional Status Assessment Patient has had a recent decline in their functional status and demonstrates the ability to make significant improvements in function in a reasonable and predictable amount of time.   09/12/2021   4:00 PM Frequency and Duration  Speech Therapy Frequency (ACUTE ONLY) min 2x/week Treatment Duration 2 weeks     09/12/2021   4:00 PM Oral Phase Oral Phase Impaired Oral - Nectar Cup Reduced posterior propulsion Oral - Thin Cup Reduced posterior propulsion;Holding of bolus Oral - Thin Straw Reduced posterior propulsion Oral - Puree Weak lingual manipulation;Reduced posterior propulsion Oral - Mech Soft Weak lingual manipulation;Impaired mastication;Reduced posterior propulsion Oral - Pill Reduced posterior propulsion    09/12/2021   4:00 PM Pharyngeal Phase Pharyngeal Phase Impaired Pharyngeal- Nectar Cup Delayed swallow initiation-vallecula;Delayed swallow initiation-pyriform sinuses;Reduced airway/laryngeal closure;Penetration/Aspiration during swallow;Pharyngeal residue - valleculae Pharyngeal Material enters airway, remains ABOVE vocal cords then ejected out Pharyngeal- Thin Cup Delayed swallow  initiation-pyriform sinuses;Delayed swallow initiation-vallecula;Reduced airway/laryngeal closure;Penetration/Aspiration during swallow;Trace aspiration Pharyngeal Material enters airway, passes BELOW cords and not ejected out despite cough attempt by patient;Material enters airway, passes BELOW cords then ejected out Pharyngeal- Thin Straw Delayed swallow initiation-pyriform sinuses;Delayed swallow initiation-vallecula;Reduced airway/laryngeal closure;Penetration/Aspiration during swallow Pharyngeal Material enters airway, remains ABOVE vocal cords then ejected out Pharyngeal- Puree Delayed swallow initiation-vallecula Pharyngeal- Mechanical Soft Delayed swallow initiation-vallecula Pharyngeal- Pill [facility]    09/12/2021   4:00 PM Cervical Esophageal Phase  Cervical Esophageal Phase [facility] Sonia Baller, MA, CCC-SLP Speech Therapy                     ECHOCARDIOGRAM COMPLETE  Result Date: 09/12/2021    ECHOCARDIOGRAM REPORT   Patient Name:   DEMONTE DOBRATZ Date of Exam: 09/12/2021 Medical Rec #:  [id number]       Height:       70.0 in Accession #:    [id number]      Weight:       193.6 lb Date of Birth:  Dec 12, 1980       BSA:          2.059 m Patient Age:    41 years        BP:           109/84 mmHg Patient Gender: M               HR:           72 bpm. Exam Location:  Inpatient Procedure: 2D Echo Indications:    stroke  History:        Patient has prior history of Echocardiogram examinations, most                 recent 09/23/2020. CAD; Risk Factors:Hypertension, Dyslipidemia                 and Diabetes.  Sonographer:    Johny Chess RDCS Referring Phys: [id number] Turner T TU  Sonographer Comments: Suboptimal subcostal window and Technically difficult study due to poor echo windows. Image acquisition challenging due to respiratory motion. IMPRESSIONS  1. Left ventricular ejection fraction, by estimation, is 60 to 65%. The left ventricle has normal function. The left ventricle has no regional wall motion  abnormalities. Left ventricular diastolic parameters were normal.  2. Right ventricular systolic function is normal. The right ventricular size is normal.  3. The mitral valve is normal in structure. No evidence of mitral valve regurgitation.  4. The aortic valve was not well visualized. Aortic valve regurgitation is not visualized.  5. IVC not well visualized. Conclusion(s)/Recommendation(s): No change from prior TEE. FINDINGS  Left Ventricle: Left ventricular ejection fraction, by estimation, is 60 to 65%. The left ventricle has normal function. The left ventricle has no regional wall motion abnormalities. The left ventricular internal cavity size was normal in size. There is  no left ventricular hypertrophy. Left ventricular diastolic parameters were normal. Right Ventricle: The right ventricular size is normal. Right ventricular systolic function is normal. Left Atrium: Left atrial size was normal in size. Right Atrium: Right atrial size was normal in size. Pericardium: There is no evidence of pericardial effusion. Mitral Valve: The mitral valve is normal in structure. No evidence of mitral valve regurgitation. Tricuspid Valve: Tricuspid valve regurgitation is not demonstrated. Aortic Valve: The aortic valve was not well visualized. Aortic valve regurgitation is not visualized. Pulmonic Valve: The pulmonic valve was not well visualized. Pulmonic valve regurgitation is not visualized. Aorta: The aortic root and ascending aorta are structurally normal, with no evidence of dilitation. Venous: IVC not well visualized. IAS/Shunts: The interatrial septum was not well visualized.  LEFT VENTRICLE PLAX 2D LVIDd:         4.10 cm   Diastology LVIDs:         2.60 cm   LV e' medial:    7.51 cm/s LV PW:         1.00 cm   LV E/e' medial:  8.5 LV IVS:        0.90 cm   LV e' lateral:   14.00 cm/s LVOT diam:     1.90 cm   LV E/e' lateral: 4.6 LV SV:         58 LV SV Index:   28 LVOT Area:     2.84 cm  RIGHT VENTRICLE RV S prime:      10.10 cm/s TAPSE (M-mode): 1.5 cm LEFT ATRIUM           Index        RIGHT ATRIUM           Index LA diam:      3.50 cm 1.70 cm/m   RA Area:     12.60 cm LA Vol (A4C): 30.4 ml 14.77 ml/m  RA Volume:   28.00 ml  13.60 ml/m  AORTIC VALVE LVOT Vmax:   99.20 cm/s LVOT Vmean:  67.600 cm/s LVOT VTI:    0.206 m  AORTA Ao Root diam: 3.30 cm MITRAL VALVE MV Area (PHT): 3.85 cm    SHUNTS MV Decel Time: 197 msec    Systemic VTI:  0.21 m MV E velocity: 64.20 cm/s  Systemic Diam: 1.90 cm MV A velocity: 57.70 cm/s MV E/A ratio:  1.11 Landscape architect signed by Phineas Inches Signature Date/Time: 09/12/2021/11:48:29 AM    Final    MR BRAIN WO CONTRAST  Result Date: 09/11/2021 CLINICAL DATA:  Initial evaluation for neuro deficit, stroke suspected. EXAM: MRI HEAD WITHOUT CONTRAST TECHNIQUE: Multiplanar, multiecho pulse sequences of the brain and surrounding structures were obtained without intravenous contrast. COMPARISON:  Prior CTs from earlier the same day. FINDINGS: Brain: Cerebral volume within normal limits. Scattered T2/FLAIR hyperintensity involving the supratentorial cerebral white matter, consistent with chronic small vessel ischemic disease. Few scatter remote lacunar infarcts  present about the right corona radiata, basal ganglia, thalami, and pons. 6 mm focus of restricted diffusion at the left ventral pons/midbrain consistent with an acute ischemic infarct. No associated hemorrhage or mass effect. No other evidence for acute or subacute ischemia. Gray-white matter differentiation otherwise maintained. No acute intracranial hemorrhage. No mass lesion, midline shift or mass effect. No hydrocephalus or extra-axial fluid collection. Pituitary gland and suprasellar region within normal limits. Vascular: Major intracranial vascular flow voids are maintained. Skull and upper cervical spine: Craniocervical junction normal. Bone marrow signal intensity within normal limits. No scalp soft tissue abnormality.  Sinuses/Orbits: Globes orbital soft tissues within normal limits. Scattered mucosal thickening noted about the ethmoidal air cells and maxillary sinuses. No mastoid effusion. Other: None. IMPRESSION: 1. 6 mm acute ischemic nonhemorrhagic left ventral pons/midbrain infarct. 2. Underlying chronic microvascular ischemic disease with multiple remote lacunar infarcts involving the right corona radiata, basal ganglia, thalami, and pons. Electronically Signed   By: Jeannine Boga M.D.   On: 09/11/2021 19:27   CT ANGIO HEAD NECK W WO CM W PERF (CODE STROKE)  Result Date: 09/11/2021 CLINICAL DATA:  Code stroke. Slurred speech. Last known well 3 a.m. EXAM: CT ANGIOGRAPHY HEAD AND NECK CT PERFUSION BRAIN TECHNIQUE: Multidetector CT imaging of the head and neck was performed using the standard protocol during bolus administration of intravenous contrast. Multiplanar CT image reconstructions and MIPs were obtained to evaluate the vascular anatomy. Carotid stenosis measurements (when applicable) are obtained utilizing NASCET criteria, using the distal internal carotid diameter as the denominator. Multiphase CT imaging of the brain was performed following IV bolus contrast injection. Subsequent parametric perfusion maps were calculated using RAPID software. RADIATION DOSE REDUCTION: This exam was performed according to the departmental dose-optimization program which includes automated exposure control, adjustment of the mA and/or kV according to patient size and/or use of iterative reconstruction technique. CONTRAST:  116m OMNIPAQUE IOHEXOL 350 MG/ML SOLN COMPARISON:  CT head without contrast 09/11/2021. MR head without contrast 08/26/2020. MRA head without contrast 08/27/2020 FINDINGS: CTA NECK FINDINGS Aortic arch: 3 vessel arch configuration is present. No significant atherosclerotic changes are present at the arch. No aneurysm or stenosis. Right carotid system: The right common carotid artery is within normal  limits. Bifurcation is unremarkable. Cervical right ICA is normal. Left carotid system: The left common carotid artery is within normal limits. The bifurcation is unremarkable. Cervical left ICA is normal. Vertebral arteries: The left vertebral artery is dominant. Both vertebral arteries originate from the subclavian arteries without significant stenosis. No significant stenosis is present in either vertebral artery in the neck. Skeleton: Vertebral body heights and alignment are normal. No focal osseous lesions present. The patient is edentulous. Other neck: Soft tissues the neck are otherwise unremarkable. Salivary glands are within normal limits. Thyroid is normal. No significant adenopathy is present. No focal mucosal or submucosal lesions are present. Upper chest: Lung apices are clear. Thoracic inlet is within normal limits. Review of the MIP images confirms the above findings CTA HEAD FINDINGS Anterior circulation: The carotid arteries are within normal limits the skull base through the ICA termini. The A1 and M1 segments are normal. The anterior communicating artery is patent. MCA bifurcations are within normal limits bilaterally. The ACA and MCA branch vessels are normal. Posterior circulation: The left vertebral artery is the dominant vessel. The right vertebral artery terminates at the PICA. Left PICA origin is visualized and normal. The left vertebral artery becomes the basilar artery. Basilar artery is normal. Both posterior cerebral arteries  originate from basilar tip. The PCA branch vessels are within normal limits bilaterally. Venous sinuses: The dural sinuses are patent. The straight sinus deep cerebral veins are intact. Cortical veins are within normal limits. No significant vascular malformation is evident. Anatomic variants: None Review of the MIP images confirms the above findings CT Brain Perfusion Findings: The field of view is lower than typical protocol. Software could not calculate perfusion  mass. IMPRESSION: 1. No emergent large vessel occlusion. 2. No significant stenosis in the neck or circle-of-Willis. 3. CT perfusion maps could not be calculated due to technical factors related to the field of view. The patient could be re-scanned for perfusion if clinically indicated. Electronically Signed   By: San Morelle M.D.   On: 09/11/2021 18:27   CT HEAD CODE STROKE WO CONTRAST  Result Date: 09/11/2021 CLINICAL DATA:  Code stroke. Slurred speech. Last known well 3 a.m. EXAM: CT HEAD WITHOUT CONTRAST TECHNIQUE: Contiguous axial images were obtained from the base of the skull through the vertex without intravenous contrast. RADIATION DOSE REDUCTION: This exam was performed according to the departmental dose-optimization program which includes automated exposure control, adjustment of the mA and/or kV according to patient size and/or use of iterative reconstruction technique. COMPARISON:  CT head without contrast 11/16/2020. MR head without contrast 08/26/2020 FINDINGS: Brain: Remote internal capsule lacunar infarcts noted bilaterally. Remote left thalamic lacunar infarct noted. Mild atrophy and diffuse white matter disease is otherwise stable. Basal ganglia are otherwise intact. Insular ribbon is normal. Acute or focal cortical abnormality is present. The ventricles are of normal size. No significant extraaxial fluid collection is present. The brainstem and cerebellum are within normal limits. Vascular: Atherosclerotic calcifications are present within the cavernous internal carotid arteries bilaterally. No hyperdense vessel is present. Skull: Calvarium is intact. No focal lytic or blastic lesions are present. No significant extracranial soft tissue lesion is present. Sinuses/Orbits: The paranasal sinuses and mastoid air cells are clear. The globes and orbits are within normal limits. ASPECTS [facility] Stroke Program Early CT Score) - Ganglionic level infarction (caudate, lentiform nuclei,  internal capsule, insula, M1-M3 cortex): 7/7 - Supraganglionic infarction (M4-M6 cortex): 3/3 Total score (0-10 with 10 being normal): 10/10 IMPRESSION: 1. No acute intracranial abnormality or significant interval change. 2. Stable atrophy and diffuse white matter disease. 3. Remote lacunar infarcts involving the internal capsule bilaterally and left thalamus. 4. Aspects 10/10 * The above was relayed via text pager to Dr. Leonel Ramsay on 09/11/2021 at 17:30 . Electronically Signed   By: San Morelle M.D.   On: 09/11/2021 17:31      Subjective:  No Significant events overnight, he denies any complaints. Discharge Exam: Vitals:   09/28/21 1155 09/28/21 1605  BP: 106/72 (!) 135/99  Pulse: 73 94  Resp: 18 18  Temp: 98.4 F (36.9 C) 98.5 F (36.9 C)  SpO2:     Vitals:   09/28/21 0021 09/28/21 0753 09/28/21 1155 09/28/21 1605  BP: 119/87 109/87 106/72 (!) 135/99  Pulse: 92 79 73 94  Resp: 18 18 18 18   Temp: (!) 97.4 F (36.3 C) 97.8 F (36.6 C) 98.4 F (36.9 C) 98.5 F (36.9 C)  TempSrc: Oral Oral Oral Oral  SpO2: 96%     Weight:      Height:        General: Pt is alert, awake, not in acute distress, speech is slow, right-sided weakness improving cardiovascular: RRR, S1/S2 +, no rubs, no gallops Respiratory: CTA bilaterally, no wheezing, no rhonchi Abdominal: Soft, NT,  ND, bowel sounds + Extremities: no edema, no cyanosis    The results of significant diagnostics from this hospitalization (including imaging, microbiology, ancillary and laboratory) are listed below for reference.     Microbiology: No results found for this or any previous visit (from the past 240 hour(s)).   Labs: BNP (last 3 results) No results for input(s): "BNP" in the last 8760 hours. Basic Metabolic Panel: Recent Labs  Lab 09/26/21 0334  NA 141  K 3.5  CL 106  CO2 26  GLUCOSE 82  BUN 13  CREATININE 0.86  CALCIUM 9.2   Liver Function Tests: No results for input(s): "AST", "ALT",  "ALKPHOS", "BILITOT", "PROT", "ALBUMIN" in the last 168 hours. No results for input(s): "LIPASE", "AMYLASE" in the last 168 hours. No results for input(s): "AMMONIA" in the last 168 hours. CBC: Recent Labs  Lab 09/26/21 0334  WBC 12.5*  HGB 15.8  HCT 44.8  MCV 90.9  PLT 258   Cardiac Enzymes: No results for input(s): "CKTOTAL", "CKMB", "CKMBINDEX", "TROPONINI" in the last 168 hours. BNP: Invalid input(s): "POCBNP" CBG: Recent Labs  Lab 09/27/21 1646 09/27/21 2129 09/28/21 0754 09/28/21 1157 09/28/21 1608  GLUCAP 208* 144* 129* 170* 147*   D-Dimer No results for input(s): "DDIMER" in the last 72 hours. Hgb A1c No results for input(s): "HGBA1C" in the last 72 hours. Lipid Profile No results for input(s): "CHOL", "HDL", "LDLCALC", "TRIG", "CHOLHDL", "LDLDIRECT" in the last 72 hours. Thyroid function studies No results for input(s): "TSH", "T4TOTAL", "T3FREE", "THYROIDAB" in the last 72 hours.  Invalid input(s): "FREET3" Anemia work up No results for input(s): "VITAMINB12", "FOLATE", "FERRITIN", "TIBC", "IRON", "RETICCTPCT" in the last 72 hours. Urinalysis    Component Value Date/Time   COLORURINE YELLOW (A) 11/16/2020 1810   APPEARANCEUR HAZY (A) 11/16/2020 1810   LABSPEC 1.020 11/16/2020 1810   PHURINE 5.0 11/16/2020 1810   GLUCOSEU >=500 (A) 11/16/2020 1810   HGBUR NEGATIVE 11/16/2020 1810   BILIRUBINUR NEGATIVE 11/16/2020 1810   KETONESUR NEGATIVE 11/16/2020 1810   PROTEINUR 100 (A) 11/16/2020 1810   NITRITE NEGATIVE 11/16/2020 1810   LEUKOCYTESUR NEGATIVE 11/16/2020 1810   Sepsis Labs Recent Labs  Lab 09/26/21 0334  WBC 12.5*   Microbiology No results found for this or any previous visit (from the past 240 hour(s)).   Time coordinating discharge: Over 30 minutes  SIGNED:   Phillips Climes, MD  [facility] 09/28/2021, 4:49 PM Pager   If 7PM-7AM, please contact night-coverage [email or web address]

## 2021-09-29 ENCOUNTER — Telehealth: Payer: Self-pay

## 2021-09-29 NOTE — Telephone Encounter (Signed)
Notified by Iantha Fallen that they are able to accept for [facility] services through charity.

## 2021-09-29 NOTE — Telephone Encounter (Signed)
Transition Care Management Unsuccessful Follow-up Telephone Call  Date of discharge and from where:  [facility] 8/16  Attempts:  1st Attempt  Reason for unsuccessful TCM follow-up call:  No answer/busy

## 2021-10-13 ENCOUNTER — Telehealth: Payer: Self-pay

## 2021-10-13 NOTE — Telephone Encounter (Signed)
He needs to schedule an appt to see me

## 2021-10-13 NOTE — Telephone Encounter (Signed)
Copied from CRM [id number]. Topic: General - Other >> Oct 13, 2021  1:33 PM Clide Dales wrote: Kennyth Arnold with [facility] said patients heart rate was 122 during pt today.

## 2021-10-15 NOTE — Telephone Encounter (Signed)
LMTCB 10/15/2021.  PEC please schedule pt when he calls back.   Thanks,   -Vernona Rieger

## 2021-11-19 ENCOUNTER — Other Ambulatory Visit: Payer: Self-pay | Admitting: Internal Medicine

## 2021-11-19 DIAGNOSIS — I209 Angina pectoris, unspecified: Secondary | ICD-10-CM

## 2021-11-19 NOTE — Telephone Encounter (Signed)
Requested medication (s) are due for refill today: review  Requested medication (s) are on the active medication list: no  Last refill:  11/13/19  Future visit scheduled: no  Notes to clinic:  rx was 09/28/2021 by [name], [name], MD for the following reason: Stop Taking at Discharge. Please review if needed to continue     Requested Prescriptions  Pending Prescriptions Disp Refills   nitroGLYCERIN (NITROSTAT) 0.4 MG SL tablet [Pharmacy Med Name: NITROGLYCERIN 0.4MG  SUB TAB 25S] 25 tablet 2    Sig: DISSOLVE 1 TABLET UNDER THE TONGUE EVERY 5 MINUTES AS NEEDED FOR CHEST PAIN. DO NOT EXCEED A TOTAL OF 3 DOSES IN 15 MINUTES     There is no refill protocol information for this order

## 2022-01-14 DIAGNOSIS — Z419 Encounter for procedure for purposes other than remedying health state, unspecified: Secondary | ICD-10-CM | POA: Diagnosis not present

## 2022-01-21 ENCOUNTER — Telehealth: Payer: Self-pay

## 2022-01-21 NOTE — Telephone Encounter (Signed)
Pt's wife [name] dropped off paperwork to be filled out.  Mr. Fehnel will need to have an office visit before it can be filled out.  I left a message for pt to schedule an appointment.  PEC please schedule when he calls back.    Thanks,   -Vernona Rieger

## 2022-01-25 ENCOUNTER — Emergency Department: Payer: Medicaid Other

## 2022-01-25 ENCOUNTER — Emergency Department
Admission: EM | Admit: 2022-01-25 | Discharge: 2022-01-25 | Disposition: A | Discharge status: Home / Self Care | Payer: Medicaid Other | Attending: Emergency Medicine | Admitting: Emergency Medicine

## 2022-01-25 ENCOUNTER — Encounter: Payer: Self-pay | Admitting: Emergency Medicine

## 2022-01-25 ENCOUNTER — Other Ambulatory Visit: Payer: Self-pay

## 2022-01-25 DIAGNOSIS — W19XXXA Unspecified fall, initial encounter: Secondary | ICD-10-CM | POA: Diagnosis not present

## 2022-01-25 DIAGNOSIS — Y9301 Activity, walking, marching and hiking: Secondary | ICD-10-CM | POA: Diagnosis not present

## 2022-01-25 DIAGNOSIS — E119 Type 2 diabetes mellitus without complications: Secondary | ICD-10-CM | POA: Insufficient documentation

## 2022-01-25 DIAGNOSIS — Z8673 Personal history of transient ischemic attack (TIA), and cerebral infarction without residual deficits: Secondary | ICD-10-CM | POA: Insufficient documentation

## 2022-01-25 DIAGNOSIS — I10 Essential (primary) hypertension: Secondary | ICD-10-CM | POA: Diagnosis not present

## 2022-01-25 DIAGNOSIS — S4991XA Unspecified injury of right shoulder and upper arm, initial encounter: Secondary | ICD-10-CM | POA: Diagnosis not present

## 2022-01-25 MED ORDER — METHOCARBAMOL 500 MG PO TABS: 500.0000 mg | ORAL_TABLET | Freq: Three times a day (TID) | ORAL | 0 refills | Status: AC | PRN

## 2022-01-25 MED ORDER — ACETAMINOPHEN 500 MG PO TABS: 1000.0000 mg | ORAL_TABLET | Freq: Four times a day (QID) | ORAL | 2 refills | Status: AC | PRN

## 2022-01-25 NOTE — Discharge Instructions (Signed)
-  Your x-ray does not show any signs of fractures or dislocations.  I suspect that you may have an underlying soft tissue injury in your shoulder.  You may utilize the sling as needed.  You may additionally take meloxicam for the pain.  -You may take the muscle relaxer, methocarbamol, every 8 hours as needed, though use caution as it may make you dizzy/drowsy.

## 2022-01-25 NOTE — ED Triage Notes (Signed)
Pt via POV from home. Pt c/o R shoulder pain after a mechanical fall today. Pt has hx of stroke. Denies head injury. Pt is on blood thinners. Denies any other pain. Pt is A&OX4 and NAD

## 2022-01-25 NOTE — ED Provider Notes (Signed)
[facility] Provider Note    Event Date/Time   First MD Initiated Contact with Patient 01/25/22 1657     (approximate)   History   Chief Complaint Fall   HPI Crixus Elbert Ewings Santilli is a 41 y.o. male, history of hypertension, hyperlipidemia, type 2 diabetes, GERD, recurrent strokes, presents to the emergency department for evaluation of right shoulder pain following mechanical fall that occurred approximately 3 months ago.  He states that he was walking without his usual walker, when he fell down towards the right side.  He states he feels like he hurt his right shoulder when he was trying to push himself back up.  Since then he has had persistent pain.  Denies fever/chills, cold sensation in the affected extremity, paresthesias, chest pain, shortness of breath, abdominal pain, nausea/vomiting, dizziness/lightheadedness, rashes/lesions, or weakness.  History Limitations: No limitations.        Physical Exam  Triage Vital Signs: ED Triage Vitals  Enc Vitals Group     BP 01/25/22 1553 (!) 115/91     Pulse Rate 01/25/22 1553 (!) 103     Resp 01/25/22 1553 20     Temp 01/25/22 1553 97.8 F (36.6 C)     Temp Source 01/25/22 1553 Oral     SpO2 01/25/22 1553 98 %     Weight 01/25/22 1551 185 lb (83.9 kg)     Height 01/25/22 1551 5\' 10"  (1.778 m)     Head Circumference --      Peak Flow --      Pain Score 01/25/22 1551 3     Pain Loc --      Pain Edu? --      Excl. in GC? --     Most recent vital signs: Vitals:   01/25/22 1553  BP: (!) 115/91  Pulse: (!) 103  Resp: 20  Temp: 97.8 F (36.6 C)  SpO2: 98%    General: Awake, NAD.  Skin: Warm, dry. No rashes or lesions.  Eyes: PERRL. Conjunctivae normal.  CV: Good peripheral perfusion.  Resp: Normal effort.  Abd: Soft, non-tender. No distention.  Neuro: At baseline. No gross neurological deficits.  Musculoskeletal: Normal ROM of all extremities.  Focused Exam: No gross deformities to the right  shoulder/upper extremity.  PMS intact distally.  He maintains normal range of motion, though is limited with abduction due to pain.  Empty can test does not show any weakness.  No bony tenderness.  Physical Exam    ED Results / Procedures / Treatments  Labs (all labs ordered are listed, but only abnormal results are displayed) Labs Reviewed - No data to display   EKG N/A.    RADIOLOGY  ED Provider Interpretation: I personally reviewed and interpreted this x-ray, no evidence of acute fractures or dislocations.  DG Shoulder Right  Result Date: 01/25/2022 CLINICAL DATA:  Right shoulder pain after a mechanical fall today. EXAM: RIGHT SHOULDER - 2+ VIEW COMPARISON:  None Available. FINDINGS: There is no evidence of fracture or dislocation. There is no evidence of arthropathy or other focal bone abnormality. Soft tissues are unremarkable. IMPRESSION: Negative. Electronically Signed   By: 14/01/2022 D.O.   On: 01/25/2022 16:16    PROCEDURES:  Critical Care performed: N/A.  Procedures    MEDICATIONS ORDERED IN ED: Medications - No data to display   IMPRESSION / MDM / ASSESSMENT AND PLAN / ED COURSE  I reviewed the triage vital signs and the nursing notes.  Differential diagnosis includes, but is not limited to, rotator cuff injury, labrum tear, biceps tendinitis, impingement syndrome, adhesive capsulitis, AC joint injury.   Assessment/Plan Patient presents with persistent right shoulder pain following mechanical fall 3 months prior.  He has neurovascular intact.  No gross deformities on exam.  No significant osseous tenderness.  X-rays not show any acute fractures or dislocations.  I suspect likely shoulder sprain/strain versus possible AC joint injury or labrum injury.  Will provide him with a sling and a referral to orthopedics.  Will additionally provide him with a prescription for meloxicam and brief course of methocarbamol.  Will  discharge.  Provided the patient with anticipatory guidance, return precautions, and educational material. Encouraged the patient to return to the emergency department at any time if they begin to experience any new or worsening symptoms. Patient expressed understanding and agreed with the plan.   Patient's presentation is most consistent with acute complicated illness / injury requiring diagnostic workup.       FINAL CLINICAL IMPRESSION(S) / ED DIAGNOSES   Final diagnoses:  Fall, initial encounter  Injury of right shoulder, initial encounter     Rx / DC Orders   ED Discharge Orders          Ordered    acetaminophen (TYLENOL) 500 MG tablet  Every 6 hours PRN        01/25/22 1715    methocarbamol (ROBAXIN) 500 MG tablet  Every 8 hours PRN        01/25/22 1715             Note:  This document was prepared using Dragon voice recognition software and may include unintentional dictation errors.   Varney Daily, [place] 01/25/22 1716    Merwyn Katos, MD 01/25/22 2010

## 2022-02-02 ENCOUNTER — Telehealth: Payer: Self-pay | Admitting: Internal Medicine

## 2022-02-02 NOTE — Telephone Encounter (Signed)
Called pt several times this morning to get him to give the office a call to get scheduled to make an appointment to have paperwork filled out.  Was unable to get the pt received a recording stating "Your call can not be completed at this time try your call later".  Per the provider the pt needs to make an appointment in order to get the paperwork completed.

## 2022-02-08 ENCOUNTER — Encounter: Payer: Self-pay | Admitting: Internal Medicine

## 2022-02-08 ENCOUNTER — Ambulatory Visit (INDEPENDENT_AMBULATORY_CARE_PROVIDER_SITE_OTHER): Payer: Medicaid Other | Admitting: Internal Medicine

## 2022-02-08 VITALS — BP 118/88 | HR 98 | Wt 191.0 lb

## 2022-02-08 DIAGNOSIS — I10 Essential (primary) hypertension: Secondary | ICD-10-CM | POA: Diagnosis not present

## 2022-02-08 DIAGNOSIS — K219 Gastro-esophageal reflux disease without esophagitis: Secondary | ICD-10-CM

## 2022-02-08 DIAGNOSIS — E785 Hyperlipidemia, unspecified: Secondary | ICD-10-CM

## 2022-02-08 DIAGNOSIS — I639 Cerebral infarction, unspecified: Secondary | ICD-10-CM

## 2022-02-08 DIAGNOSIS — F4323 Adjustment disorder with mixed anxiety and depressed mood: Secondary | ICD-10-CM | POA: Diagnosis not present

## 2022-02-08 DIAGNOSIS — E1169 Type 2 diabetes mellitus with other specified complication: Secondary | ICD-10-CM

## 2022-02-08 DIAGNOSIS — E782 Mixed hyperlipidemia: Secondary | ICD-10-CM | POA: Diagnosis not present

## 2022-02-08 DIAGNOSIS — E663 Overweight: Secondary | ICD-10-CM | POA: Diagnosis not present

## 2022-02-08 DIAGNOSIS — I213 ST elevation (STEMI) myocardial infarction of unspecified site: Secondary | ICD-10-CM | POA: Diagnosis not present

## 2022-02-08 DIAGNOSIS — I2510 Atherosclerotic heart disease of native coronary artery without angina pectoris: Secondary | ICD-10-CM

## 2022-02-08 DIAGNOSIS — Z6827 Body mass index (BMI) 27.0-27.9, adult: Secondary | ICD-10-CM

## 2022-02-08 LAB — POCT GLYCOSYLATED HEMOGLOBIN (HGB A1C)
HbA1c POC (<> result, manual entry): >14 % (ref 4.0–5.6)
Hemoglobin A1C: 14 % — AB (ref 4.0–5.6)

## 2022-02-08 NOTE — Assessment & Plan Note (Signed)
No angina He is not currently taking any medications C-Met and lipid profile today Encouraged him to consume a low-fat diet

## 2022-02-08 NOTE — Progress Notes (Signed)
Subjective:    Patient ID: Martin Mullen, male    DOB: April 26, 1980, 41 y.o.   MRN: [id number]  HPI  Patient presents to clinic today for follow-up of chronic conditions.  HTN: His BP today is 118/88.  He is not taking any antihypertensive medication at this time.  ECG from 08/2021 reviewed.  HLD with CAD status post STEMI, History of Stroke: His last LDL was 75, triglycerides 112, 08/2021.  He is not taking Rosuvastatin, Fish Oil, Aspirin or Eliquis as prescribed.  He does not consume low-fat diet.  He does not follow with cardiology and neurology.  DM2: His last A1c was >14, 07/2021.  He is not taking Metformin and Lantus as prescribed.  He does not check his sugars.  He does not check his feet routinely.  His last eye exam was > more 1 year ago.  Flu 11/2020.  Pneumovax 06/2017.  COVID never.  GERD: Currently not an issue.  He is not taking any medication for this.  There is no upper GI on file.  Anxiety and Depression: He is not currently taking any medications for this.  He is not currently seeing a therapist.  He denies SI/HI.   Review of Systems  Past Medical History:  Diagnosis Date   Acute transmural inferior wall MI ([place]) 08/28/2012   .5 x 12 mm Veri-flex stent non-DES.   Chicken pox    Coronary artery disease    Inferior ST elevation myocardial infarction in July of 2014. Cardiac catheterization showed 95% distal RCA stenosis and 90% first diagonal stenosis with normal ejection fraction. He underwent PCI in bare-metal stent placement to the distal RCA.   Diabetes mellitus without complication (HCC)    Hypercholesterolemia    Stroke ([place])    Tobacco use     Current Outpatient Medications  Medication Sig Dispense Refill   Accu-Chek Softclix Lancets lancets Use As Directed 100 each 0   acetaminophen (TYLENOL) 500 MG tablet Take 2 tablets (1,000 mg total) by mouth every 6 (six) hours as needed. 100 tablet 2   apixaban (ELIQUIS) 5 MG TABS tablet Take 1 tablet (5 mg total) by  mouth 2 (two) times daily. 60 tablet 0   aspirin EC 81 MG tablet Take 1 tablet (81 mg total) by mouth daily. 30 tablet 0   Blood Glucose Monitoring Suppl (ACCU-CHEK GUIDE) w/Device KIT use to check blood sugars up to 4 times daily. 1 kit 0   buPROPion (WELLBUTRIN XL) 150 MG 24 hr tablet Take 1 tablet (150 mg total) by mouth daily. 30 tablet 0   CONTOUR NEXT TEST test strip USE 1 STRIP TO CHECK GLUCOSE THREE TIMES DAILY AS NEEDED 100 each 0   food thickener (SIMPLYTHICK, NECTAR/LEVEL 2/MILDLY THICK,) GEL Take 10 packets by mouth as needed. 100 packet 0   glucose blood test strip Use As Directed 100 each 0   insulin glargine-yfgn (SEMGLEE) 100 UNIT/ML Pen Inject 12 Units into the skin daily. 360 mL 0   Insulin Pen Needle 32G X 4 MM MISC Use As Directed 100 each 0   metFORMIN (GLUCOPHAGE) 1000 MG tablet Take 1 tablet (1,000 mg total) by mouth 2 (two) times daily with a meal. TAKE 1 TABLET(1000 MG) BY MOUTH TWICE DAILY WITH A MEAL 60 tablet 0   Multiple Vitamin (MULTIVITAMIN WITH MINERALS) TABS tablet Take 1 tablet by mouth daily. One-A-Day Multivitamin 30 tablet 0   rosuvastatin (CRESTOR) 20 MG tablet Take 1 tablet (20 mg total) by mouth daily.  30 tablet 0   No current facility-administered medications for this visit.    No Known Allergies  Family History  Problem Relation Age of Onset   Arthritis Mother        Rheumatoid   Diabetes Mother    Hyperlipidemia Mother    Diabetes Father    Cancer Maternal Grandfather        Prostate   Parkinson's disease Maternal Grandfather     Social History   Socioeconomic History   Marital status: Divorced    Spouse name: Not on file   Number of children: 2   Years of education: Not on file   Highest education level: Not on file  Occupational History   Not on file  Tobacco Use   Smoking status: Every Day    Packs/day: 1.00    Years: 30.00    Total pack years: 30.00    Types: Cigarettes   Smokeless tobacco: Never  Vaping Use   Vaping Use:  Never used  Substance and Sexual Activity   Alcohol use: Not Currently    Alcohol/week: 0.0 standard drinks of alcohol    Comment: occasional   Drug use: Not Currently    Types: Marijuana   Sexual activity: Yes    Birth control/protection: None  Other Topics Concern   Not on file  Social History Narrative   Lives with girlfriend and her mother    Social Determinants of Health   Financial Resource Strain: Not on file  Food Insecurity: Not on file  Transportation Needs: Not on file  Physical Activity: Not on file  Stress: Not on file  Social Connections: Not on file  Intimate Partner Violence: Not on file     Constitutional: Denies fever, malaise, fatigue, headache or abrupt weight changes.  HEENT: Denies eye pain, eye redness, ear pain, ringing in the ears, wax buildup, runny nose, nasal congestion, bloody nose, or sore throat. Respiratory: Denies difficulty breathing, shortness of breath, cough or sputum production.   Cardiovascular: Denies chest pain, chest tightness, palpitations or swelling in the hands or feet.  Gastrointestinal: Denies abdominal pain, bloating, constipation, diarrhea or blood in the stool.  GU: Denies urgency, frequency, pain with urination, burning sensation, blood in urine, odor or discharge. Musculoskeletal: Denies decrease in range of motion, difficulty with gait, muscle pain or joint pain and swelling.  Skin: Denies redness, rashes, lesions or ulcercations.  Neurological: Pt reports difficulty with memory, difficulty with speech. Denies dizziness, or problems with balance and coordination.  Psych: Patient has a history of anxiety and depression.  Denies SI/HI.  No other specific complaints in a complete review of systems (except as listed in HPI above).     Objective:   Physical Exam  BP 118/88   Pulse 98   Wt 191 lb (86.6 kg)   SpO2 98%   BMI 27.41 kg/m   Wt Readings from Last 3 Encounters:  01/25/22 185 lb (83.9 kg)  09/15/21 192 lb 3.9  oz (87.2 kg)  08/05/21 195 lb (88.5 kg)    General: Appears older than his stated age, chronically ill appearing, in NAD. Skin: Warm, dry and intact.  Vascular changes noted to lower extremities with multiple scabbed areas but no ulcerations HEENT: Head: normal shape and size; Eyes: sclera white, no icterus, conjunctiva pink, PERRLA and EOMs intact;  Neck:  Neck supple, trachea midline. No masses, lumps or thyromegaly present.  Cardiovascular: Normal rate and rhythm. S1,S2 noted.  No murmur, rubs or gallops noted. No JVD  or BLE edema.  Pulmonary/Chest: Normal effort and positive vesicular breath sounds. No respiratory distress. No wheezes, rales or ronchi noted.  Abdomen: Soft and nontender. Normal bowel sounds. No distention or masses noted.  Musculoskeletal: Ataxic gait, unsteady.  He does not appear to have foot drop. Neurological: Alert and oriented.  He seems to have some expressive aphasia.  His speech is garbled.  His coordination is slowed.  He is able to follow commands. Psychiatric: Mood and affect flat. Behavior is normal. Judgment and thought content normal.     BMET    Component Value Date/Time   NA 141 09/26/2021 0334   NA 140 08/11/2021 0937   NA 139 01/29/2013 0040   K 3.5 09/26/2021 0334   K 4.0 01/29/2013 0040   CL 106 09/26/2021 0334   CL 107 01/29/2013 0040   CO2 26 09/26/2021 0334   CO2 28 01/29/2013 0040   GLUCOSE 82 09/26/2021 0334   GLUCOSE 228 (H) 01/29/2013 0040   BUN 13 09/26/2021 0334   BUN 11 08/11/2021 0937   BUN 15 01/29/2013 0040   CREATININE 0.86 09/26/2021 0334   CREATININE 0.91 12/07/2020 1037   CALCIUM 9.2 09/26/2021 0334   CALCIUM 9.2 01/29/2013 0040   GFRNONAA >60 09/26/2021 0334   GFRNONAA >60 01/29/2013 0040   GFRAA 104 11/13/2019 1550   GFRAA >60 01/29/2013 0040    Lipid Panel     Component Value Date/Time   CHOL 124 09/12/2021 0419   CHOL 159 08/11/2021 0937   TRIG 112 09/12/2021 0419   HDL 27 (L) 09/12/2021 0419   HDL 26  (L) 08/11/2021 0937   CHOLHDL 4.6 09/12/2021 0419   VLDL 22 09/12/2021 0419   LDLCALC 75 09/12/2021 0419   LDLCALC 102 (H) 08/11/2021 0937   LDLCALC 53 12/07/2020 1037    CBC    Component Value Date/Time   WBC 12.5 (H) 09/26/2021 0334   RBC 4.93 09/26/2021 0334   HGB 15.8 09/26/2021 0334   HGB 17.9 (H) 08/11/2021 0937   HCT 44.8 09/26/2021 0334   HCT 51.0 08/11/2021 0937   PLT 258 09/26/2021 0334   PLT 261 08/11/2021 0937   MCV 90.9 09/26/2021 0334   MCV 93 08/11/2021 0937   MCV 93 01/29/2013 0040   MCH 32.0 09/26/2021 0334   MCHC 35.3 09/26/2021 0334   RDW 12.5 09/26/2021 0334   RDW 12.4 08/11/2021 0937   RDW 13.8 01/29/2013 0040   LYMPHSABS 3.9 09/15/2021 0437   LYMPHSABS 4.8 (H) 08/11/2021 0937   MONOABS 0.7 09/15/2021 0437   EOSABS 0.4 09/15/2021 0437   EOSABS 0.5 (H) 08/11/2021 0937   BASOSABS 0.1 09/15/2021 0437   BASOSABS 0.1 08/11/2021 0937    Hgb A1C Lab Results  Component Value Date   HGBA1C >14 08/05/2021          Assessment & Plan:      RTC in 3 months, follow-up chronic conditions Webb Silversmith, NP

## 2022-02-08 NOTE — Assessment & Plan Note (Signed)
C-Met and lipid profile today Encouraged him to consume low-fat diet

## 2022-02-08 NOTE — Assessment & Plan Note (Signed)
He does not want to get restarted on any medications for this Support offered

## 2022-02-08 NOTE — Assessment & Plan Note (Signed)
Controlled off meds We will monitor 

## 2022-02-08 NOTE — Telephone Encounter (Signed)
Pt has an appointment to see Rene Kocher 02/08/2022 @ 2:20 and she will discuss paperwork at that time.

## 2022-02-08 NOTE — Assessment & Plan Note (Signed)
He is not currently taking any antihypertensive, cholesterol-lowering medication or blood thinners at this time He is not following with cardiology C-Met, lipid and A1c today

## 2022-02-08 NOTE — Assessment & Plan Note (Signed)
Encourage diet and exercise for weight loss 

## 2022-02-08 NOTE — Assessment & Plan Note (Signed)
He is not currently taking any antihypertensive, cholesterol-lowering medication or blood thinners at this time He is not following with neurology C-Met, lipid and A1c today

## 2022-02-08 NOTE — Assessment & Plan Note (Signed)
Currently not an issue °We will monitor °

## 2022-02-08 NOTE — Assessment & Plan Note (Signed)
Disabling He is not currently taking any antihypertensive, cholesterol-lowering medication or blood thinners at this time He is not following with neurology C-Met, lipid and A1c today

## 2022-02-08 NOTE — Assessment & Plan Note (Signed)
POCT A1c greater than 14% We will check urine microalbumin today He reports he is unable to afford any of his medications, referral to CCM placed Encourage low-carb diet and exercise for weight loss Encourage routine eye exam Encourage routine foot exam He declines immunizations today

## 2022-02-08 NOTE — Patient Instructions (Signed)
Stroke Prevention Some medical conditions and lifestyle choices can lead to a higher risk for a stroke. You can help to prevent a stroke by eating healthy foods and exercising. It also helps to not smoke and to manage any health problems you may have. How can this condition affect me? A stroke is an emergency. It should be treated right away. A stroke can lead to brain damage or threaten your life. There is a better chance of surviving and getting better after a stroke if you get medical help right away. What can increase my risk? The following medical conditions may increase your risk of a stroke: Diseases of the heart and blood vessels (cardiovascular disease). High blood pressure (hypertension). Diabetes. High cholesterol. Sickle cell disease. Problems with blood clotting. Being very overweight. Sleeping problems (obstructivesleep apnea). Other risk factors include: Being older than age 60. A history of blood clots, stroke, or mini-stroke (TIA). Race, ethnic background, or a family history of stroke. Smoking or using tobacco products. Taking birth control pills, especially if you smoke. Heavy alcohol and drug use. Not being active. What actions can I take to prevent this? Manage your health conditions High cholesterol. Eat a healthy diet. If this is not enough to manage your cholesterol, you may need to take medicines. Take medicines as told by your doctor. High blood pressure. Try to keep your blood pressure below 130/80. If your blood pressure cannot be managed through a healthy diet and regular exercise, you may need to take medicines. Take medicines as told by your doctor. Ask your doctor if you should check your blood pressure at home. Have your blood pressure checked every year. Diabetes. Eat a healthy diet and get regular exercise. If your blood sugar (glucose) cannot be managed through diet and exercise, you may need to take medicines. Take medicines as told by your  doctor. Talk to your doctor about getting checked for sleeping problems. Signs of a problem can include: Snoring a lot. Feeling very tired. Make sure that you manage any other conditions you have. Nutrition  Follow instructions from your doctor about what to eat or drink. You may be told to: Eat and drink fewer calories each day. Limit how much salt (sodium) you use to 1,500 milligrams (mg) each day. Use only healthy fats for cooking, such as olive oil, canola oil, and sunflower oil. Eat healthy foods. To do this: Choose foods that are high in fiber. These include whole grains, and fresh fruits and vegetables. Eat at least 5 servings of fruits and vegetables a day. Try to fill one-half of your plate with fruits and vegetables at each meal. Choose low-fat (lean) proteins. These include low-fat cuts of meat, chicken without skin, fish, tofu, beans, and nuts. Eat low-fat dairy products. Avoid foods that: Are high in salt. Have saturated fat. Have trans fat. Have cholesterol. Are processed or pre-made. Count how many carbohydrates you eat and drink each day. Lifestyle If you drink alcohol: Limit how much you have to: 0-1 drink a day for women who are not pregnant. 0-2 drinks a day for men. Know how much alcohol is in your drink. In the [place] one drink equals one 12 oz bottle of beer (355mL), one 5 oz glass of wine (148mL), or one 1 oz glass of hard liquor (44mL). Do not smoke or use any products that have nicotine or tobacco. If you need help quitting, ask your doctor. Avoid secondhand smoke. Do not use drugs. Activity  Try to stay at a   healthy weight. Get at least 30 minutes of exercise on most days, such as: Fast walking. Biking. Swimming. Medicines Take over-the-counter and prescription medicines only as told by your doctor. Avoid taking birth control pills. Talk to your doctor about the risks of taking birth control pills if: You are over 35 years old. You smoke. You get  very bad headaches. You have had a blood clot. Where to find more information American Stroke Association: www.strokeassociation.org Get help right away if: You or a loved one has any signs of a stroke. "BE FAST" is an easy way to remember the warning signs: B - Balance. Dizziness, sudden trouble walking, or loss of balance. E - Eyes. Trouble seeing or a change in how you see. F - Face. Sudden weakness or loss of feeling of the face. The face or eyelid may droop on one side. A - Arms. Weakness or loss of feeling in an arm. This happens all of a sudden and most often on one side of the body. S - Speech. Sudden trouble speaking, slurred speech, or trouble understanding what people say. T - Time. Time to call emergency services. Write down what time symptoms started. You or a loved one has other signs of a stroke, such as: A sudden, very bad headache with no known cause. Feeling like you may vomit (nausea). Vomiting. A seizure. These symptoms may be an emergency. Get help right away. Call your local emergency services (911 in the [place] Do not wait to see if the symptoms will go away. Do not drive yourself to the hospital. Summary You can help to prevent a stroke by eating healthy, exercising, and not smoking. It also helps to manage any health problems you have. Do not smoke or use any products that contain nicotine or tobacco. Get help right away if you or a loved one has any signs of a stroke. This information is not intended to replace advice given to you by your health care provider. Make sure you discuss any questions you have with your health care provider. Document Revised: 09/02/2019 Document Reviewed: 09/02/2019 Elsevier Patient Education  2023 Elsevier Inc.  

## 2022-02-09 LAB — COMPLETE METABOLIC PANEL WITH GFR
AG Ratio: 1.4 (calc) (ref 1.0–2.5)
ALT: 7 U/L — ABNORMAL LOW (ref 9–46)
AST: 9 U/L — ABNORMAL LOW (ref 10–40)
Albumin: 4.2 g/dL (ref 3.6–5.1)
Alkaline phosphatase (APISO): 134 U/L — ABNORMAL HIGH (ref 36–130)
BUN: 13 mg/dL (ref 7–25)
CO2: 28 mmol/L (ref 20–32)
Calcium: 10 mg/dL (ref 8.6–10.3)
Chloride: 96 mmol/L — ABNORMAL LOW (ref 98–110)
Creat: 1.01 mg/dL (ref 0.60–1.29)
Globulin: 2.9 g/dL (calc) (ref 1.9–3.7)
Glucose, Bld: 356 mg/dL — ABNORMAL HIGH (ref 65–99)
Potassium: 4.5 mmol/L (ref 3.5–5.3)
Sodium: 134 mmol/L — ABNORMAL LOW (ref 135–146)
Total Bilirubin: 0.4 mg/dL (ref 0.2–1.2)
Total Protein: 7.1 g/dL (ref 6.1–8.1)
eGFR: 96 mL/min/{1.73_m2} (ref >=60)

## 2022-02-09 LAB — LIPID PANEL
Cholesterol: 220 mg/dL — ABNORMAL HIGH (ref <200)
HDL: 29 mg/dL — ABNORMAL LOW (ref >=40)
LDL Cholesterol (Calc): 138 mg/dL (calc) — ABNORMAL HIGH
Non-HDL Cholesterol (Calc): 191 mg/dL (calc) — ABNORMAL HIGH (ref <130)
Total CHOL/HDL Ratio: 7.6 (calc) — ABNORMAL HIGH (ref <5.0)
Triglycerides: 346 mg/dL — ABNORMAL HIGH (ref <150)

## 2022-02-09 LAB — CBC
HCT: 52.1 % — ABNORMAL HIGH (ref 38.5–50.0)
Hemoglobin: 18.4 g/dL — ABNORMAL HIGH (ref 13.2–17.1)
MCH: 32.7 pg (ref 27.0–33.0)
MCHC: 35.3 g/dL (ref 32.0–36.0)
MCV: 92.5 fL (ref 80.0–100.0)
MPV: 11 fL (ref 7.5–12.5)
Platelets: 252 10*3/uL (ref 140–400)
RBC: 5.63 10*6/uL (ref 4.20–5.80)
RDW: 12 % (ref 11.0–15.0)
WBC: 9.7 10*3/uL (ref 3.8–10.8)

## 2022-02-09 LAB — MICROALBUMIN / CREATININE URINE RATIO
Creatinine, Urine: 70 mg/dL (ref 20–320)
Microalb Creat Ratio: 129 mcg/mg creat — ABNORMAL HIGH (ref <30)
Microalb, Ur: 9 mg/dL

## 2022-02-10 MED ORDER — METFORMIN HCL 1000 MG PO TABS: 1000.0000 mg | ORAL_TABLET | Freq: Two times a day (BID) | ORAL | 0 refills | Status: DC

## 2022-02-10 MED ORDER — BUPROPION HCL ER (XL) 150 MG PO TB24: 150.0000 mg | ORAL_TABLET | Freq: Every day | ORAL | 0 refills | Status: DC

## 2022-02-10 MED ORDER — INSULIN GLARGINE-YFGN 100 UNIT/ML ~~LOC~~ SOPN: 12.0000 [IU] | PEN_INJECTOR | Freq: Every day | SUBCUTANEOUS | 0 refills | Status: DC

## 2022-02-10 MED ORDER — ROSUVASTATIN CALCIUM 20 MG PO TABS: 20.0000 mg | ORAL_TABLET | Freq: Every day | ORAL | 0 refills | Status: DC

## 2022-02-10 MED ORDER — ASPIRIN 81 MG PO TBEC: 81.0000 mg | DELAYED_RELEASE_TABLET | Freq: Every day | ORAL | 0 refills | Status: AC

## 2022-02-10 MED ORDER — APIXABAN 5 MG PO TABS: 5.0000 mg | ORAL_TABLET | Freq: Two times a day (BID) | ORAL | 0 refills | Status: DC

## 2022-02-10 NOTE — Addendum Note (Signed)
Addended by: [name] on: 02/10/2022 10:02 AM   Modules accepted: Orders

## 2022-02-14 DIAGNOSIS — Z419 Encounter for procedure for purposes other than remedying health state, unspecified: Secondary | ICD-10-CM | POA: Diagnosis not present

## 2022-02-16 ENCOUNTER — Encounter: Payer: Self-pay | Admitting: Internal Medicine

## 2022-02-16 ENCOUNTER — Other Ambulatory Visit: Payer: Medicaid Other | Admitting: Obstetrics and Gynecology

## 2022-02-16 ENCOUNTER — Encounter: Payer: Self-pay | Admitting: Obstetrics and Gynecology

## 2022-02-16 MED ORDER — SILDENAFIL CITRATE 50 MG PO TABS: 50.0000 mg | ORAL_TABLET | Freq: Every day | ORAL | 0 refills | Status: DC | PRN

## 2022-02-16 NOTE — Patient Outreach (Signed)
Medicaid Managed Care   Nurse Care Manager Note  02/16/2022 Name:  Martin Mullen MRN:  [id number] DOB:  22-Jun-1980  Martin Mullen is an 42 y.o. year old male who is a primary patient of Martin Fenton, NP.  [facility] Managed Care Coordination team was consulted for assistance with:    CAD HTN HLD DMI Anxiety Depression GERD, tobacco use, h/o stroke  Mr. Mcevers was given information about Medicaid Managed Care Coordination team services today. Martin Mullen Patient agreed to services and verbal consent obtained.  Engaged with patient by telephone for initial visit in response to provider referral for case management and/or care coordination services.   Assessments/Interventions:  Review of past medical history, allergies, medications, health status, including review of consultants reports, laboratory and other test data, was performed as part of comprehensive evaluation and provision of chronic care management services.  SDOH (Social Determinants of Health) assessments and interventions performed: SDOH Interventions    Flowsheet Row Patient Outreach Telephone from 02/16/2022 in [facility] Office Visit from 12/07/2020 in [facility] Office Visit from 10/07/2020 in [facility] Office Visit from 09/02/2020 in [facility]  SDOH Interventions      Transportation Interventions Payor Benefit  [patient prfovided with [place] Medicaid transportation information] -- -- --  Depression Interventions/Treatment  -- Currently on Treatment Currently on Treatment Currently on Treatment       Care Plan  No Known Allergies  Medications Reviewed Today     Reviewed by [name], RN (Registered Nurse) on 02/16/22 at 49  Med List Status: <None>   Medication Order Taking? Sig Documenting Provider Last Dose Status Informant  Accu-Chek Softclix Lancets lancets [id number] No Use As Directed  Patient not  taking: Reported on 02/16/2022   [name], [name], MD Not Taking Active   acetaminophen (TYLENOL) 500 MG tablet [id number] No Take 2 tablets (1,000 mg total) by mouth every 6 (six) hours as needed.  Patient not taking: Reported on 02/16/2022   [name], PA Not Taking Active   apixaban (ELIQUIS) 5 MG TABS tablet [id number] Yes Take 1 tablet (5 mg total) by mouth 2 (two) times daily. Martin Fenton, NP Taking Active   aspirin EC 81 MG tablet [id number] No Take 1 tablet (81 mg total) by mouth daily.  Patient not taking: Reported on 02/16/2022   Martin Fenton, NP Not Taking Active   Blood Glucose Monitoring Suppl (ACCU-CHEK GUIDE) w/Device KIT [id number] No use to check blood sugars up to 4 times daily.  Patient not taking: Reported on 02/16/2022   [name], [name], MD Not Taking Active   buPROPion (WELLBUTRIN XL) 150 MG 24 hr tablet [id number] Yes Take 1 tablet (150 mg total) by mouth daily. Martin Fenton, NP Taking Active   CONTOUR NEXT TEST test strip [id number] No USE 1 STRIP TO CHECK GLUCOSE THREE TIMES DAILY AS NEEDED  Patient not taking: Reported on 02/16/2022   Martin Fenton, NP Not Taking Active Self  food thickener (SIMPLYTHICK, NECTAR/LEVEL 2/MILDLY THICK,) GEL [id number] No Take 10 packets by mouth as needed.  Patient not taking: Reported on 02/16/2022   [name], [name], MD Not Taking Active   glucose blood test strip [id number] No Use As Directed  Patient not taking: Reported on 02/16/2022   [name], [name], MD Not Taking Active   insulin glargine-yfgn (SEMGLEE) 100 UNIT/ML Pen [id number] No Inject 12 Units into the skin  daily.  Patient not taking: Reported on 02/16/2022   Martin Fenton, NP Not Taking Active   Insulin Pen Needle 32G X 4 MM MISC [id number] No Use As Directed  Patient not taking: Reported on 02/16/2022   [name], [name], MD Not Taking Active   metFORMIN (GLUCOPHAGE) 1000 MG tablet [id number] Yes Take 1 tablet (1,000 mg total) by mouth 2 (two) times daily  with a meal. TAKE 1 TABLET(1000 MG) BY MOUTH TWICE DAILY WITH A MEAL [name], [name], NP Taking Active   methocarbamol (ROBAXIN) 500 MG tablet [id number] No Take 500 mg by mouth in the morning and at bedtime.  Patient not taking: Reported on 02/16/2022   [provider] Not Taking Active   Multiple Vitamin (MULTIVITAMIN WITH MINERALS) TABS tablet [id number] No Take 1 tablet by mouth daily. One-A-Day Multivitamin  Patient not taking: Reported on 02/16/2022   [name], [name], MD Not Taking Active   rosuvastatin (CRESTOR) 20 MG tablet [id number] No Take 1 tablet (20 mg total) by mouth daily.  Patient not taking: Reported on 02/16/2022   Martin Fenton, NP Not Taking Active             Patient Active Problem List   Diagnosis Date Noted   Stroke [facility]) 09/11/2021   Overweight with body mass index (BMI) of 27 to 27.9 in adult 10/08/2020   Recurrent strokes ([place]) 08/27/2020   GERD (gastroesophageal reflux disease) 07/20/2019   Coronary artery disease    Type 2 diabetes mellitus with hyperlipidemia ([place]) 07/09/2013   STEMI s/p RCA stent 08/2012 09/05/2012   HTN (hypertension) 09/05/2012   HLD (hyperlipidemia) 09/05/2012   Adjustment disorder with mixed anxiety and depressed mood 09/05/2012   Conditions to be addressed/monitored per PCP order:  CAD, HTN, HLD, DMII, Anxiety, Depression, GERD, Tobacco Use, and h/o stroke  Care Plan : RN Care Manager Plan of Care  Updates made by [name], RN since 02/16/2022 12:00 AM     Problem: Health Promotion or Disease Self-Management (General Plan of Care)      Long-Range Goal: Chronic Disease Management and Care Coordination Needs   Start Date: 02/16/2022  Expected End Date: 05/19/2022  Priority: High  Note:   Current Barriers:  Knowledge Deficits related to plan of care for management of CAD, HTN, HLD, DMII, Anxiety, Depression, GERD, and Tobacco Use  Care Coordination needs related to Transportation, Mental Health Concerns , and  Substance abuse issues -  smokes 1ppd Chronic Disease Management support and education needs related to CAD, HTN, HLD, DMII, Anxiety, Depression, GERD, and Tobacco Use  Transportation barriers Non-adherence to prescribed medication regimen  RNCM Clinical Goal(s):  Patient will verbalize understanding of plan for management of CAD, HTN, HLD, DMII, Anxiety, Depression, GERD, and Tobacco Use as evidenced by patient report verbalize basic understanding of  CAD, HTN, HLD, DMII, Anxiety, Depression, GERD, and Tobacco Use disease process and self health management plan as evidenced by patient report take all medications exactly as prescribed and will call provider for medication related questions as evidenced by patient report demonstrate understanding of rationale for each prescribed medication as evidenced by report attend all scheduled medical appointments as evidenced by patient report demonstrate Improved adherence to prescribed treatment plan for CAD, HTN, HLD, DMII, Anxiety, Depression, GERD, and Tobacco Use as evidenced by patient report continue to work with RN Care Manager to address care management and care coordination needs related to  CAD, HTN, HLD, DMII, Anxiety, Depression, GERD, and Tobacco Use  as evidenced by adherence to CM Team Scheduled appointments work with pharmacist to address Limited education about medications * related toCAD, HTN, HLD, DMII, Anxiety, Depression, GERD, and Tobacco Use as evidenced by review or EMR and patient or pharmacist report work with Education officer, museum to address  related to the Sports coach, Medication procurement, Mental Health Concerns , and Substance abuse issues related to the management of CAD, HTN, HLD, DMII, Anxiety, Depression, GERD, and Tobacco Use as evidenced by review of EMR and patient or Education officer, museum report through collaboration with Consulting civil engineer, provider, and care team.   Interventions: Inter-disciplinary care team  collaboration (see longitudinal plan of care) Evaluation of current treatment plan related to  self management and patient's adherence to plan as established by provider  CAD Interventions: (Status:  New goal.) Long Term Goal Assessed understanding of CAD diagnosis Medications reviewed including medications utilized in CAD treatment plan Reviewed Importance of taking all medications as prescribed Reviewed Importance of attending all scheduled provider appointments Assessed social determinant of health barriers  Diabetes Interventions:  (Status:  New goal.) Long Term Goal Assessed patient's understanding of A1c goal: <7% Reviewed medications with patient and discussed importance of medication adherence Counseled on importance of regular laboratory monitoring as prescribed Discussed plans with patient for ongoing care management follow up and provided patient with direct contact information for care management team Reviewed scheduled/upcoming provider appointments  Advised patient, providing education and rationale, to check cbg as directed  and record, calling provider  for findings outside established parameters Referral made to pharmacy team for assistance with mediation review Referral made to social work team for assistance with h/o mental health concerns, transportation, medication procurement Review of patient status, including review of consultants reports, relevant laboratory and other test results, and medications completed Assessed social determinant of health barriers Lab Results  Component Value Date   HGBA1C 14.0 (A) 02/08/2022   HGBA1C >14.0 02/08/2022  Hyperlipidemia Interventions:  (Status:  New goal.) Long Term Goal Medication review performed; medication list updated in electronic medical record.  Counseled on importance of regular laboratory monitoring as prescribed Reviewed importance of limiting foods high in cholesterol Assessed social determinant of health barriers    Hypertension Interventions:  (Status:  New goal.) Long Term Goal Last practice recorded BP readings:  BP Readings from Last 3 Encounters:  02/08/22 118/88  01/25/22 (!) 115/91  09/28/21 (!) 135/99  Most recent eGFR/CrCl:  Lab Results  Component Value Date   EGFR 96 02/08/2022    No components found for: "CRCL"  Evaluation of current treatment plan related to hypertension self management and patient's adherence to plan as established by provider Reviewed medications with patient and discussed importance of compliance Discussed plans with patient for ongoing care management follow up and provided patient with direct contact information for care management team Reviewed scheduled/upcoming provider appointments including:  Assessed social determinant of health barriers  Smoking Cessation Interventions:  (Status:  New goal.) Long Term Goal Reviewed smoking history:   currently smoking 1 ppd  Evaluation of current treatment plan reviewed Reviewed scheduled/upcoming provider appointments Referred patient to pharmacy team Discussed plans with patient for ongoing care management follow up and provided patient with direct contact information for care management team Assessed social determinant of health barriers  Patient Goals/Self-Care Activities: Take all medications as prescribed Attend all scheduled provider appointments Call pharmacy for medication refills 3-7 days in advance of running out of medications Perform all self care activities independently  Perform IADL's (shopping,  preparing meals, housekeeping, managing finances) independently Call provider office for new concerns or questions  Work with the social worker to address care coordination needs and will continue to work with the clinical team to address health care and disease management related needs  Follow Up Plan:  The patient has been provided with contact information for the care management team and has been advised  to call with any health related questions or concerns.  The care management team will reach out to the patient again over the next 30 business  days.    Long-Range Goal: Establish Plan of Care for Chronic Disease Management Needs   Priority: High  Note:   Timeframe:  Long-Range Goal Priority:  High Start Date:   02/16/22                          Expected End Date:  ongoing                     Follow Up Date  03/15/22   - practice safe sex - schedule appointment for flu shot - schedule appointment for vaccines needed due to my age or health - schedule recommended health tests  - schedule and keep appointment for annual check-up    Why is this important?   Screening tests can find diseases early when they are easier to treat.  Your doctor or nurse will talk with you about which tests are important for you.  Getting shots for common diseases like the flu and shingles will help prevent them.     Follow Up:  Patient agrees to Care Plan and Follow-up.  Plan: The Managed Medicaid care management team will reach out to the patient again over the next 30 business  days. and The  Patient has been provided with contact information for the Managed Medicaid care management team and has been advised to call with any health related questions or concerns.  Date/time of next scheduled RN care management/care coordination outreach:  03/15/22 at 1230.

## 2022-02-16 NOTE — Patient Instructions (Addendum)
Hi Martin Mullen, thank you for speaking with me this afternoon-have a nice afternoon!  Martin Mullen was given information about Medicaid Managed Care team care coordination services as a part of their [facility] Medicaid benefit. Martin Mullen verbally consented to engagement with the [facility] Managed Care team.   If you are experiencing a medical emergency, please call 911 or report to your local emergency department or urgent care.   If you have a non-emergency medical problem during routine business hours, please contact your provider's office and ask to speak with a nurse.   For questions related to your [facility] health plan, please call: [phone] or go here:[email or web address]  If you would like to schedule transportation through your [facility] plan, please call the following number at least 2 days in advance of your appointment: [phone].  You can also use the MTM portal or MTM mobile app to manage your rides. For the portal, please go to [email or web address].  Call the [place] at [phone], at any time, 24 hours a day, 7 days a week. If you are in danger or need immediate medical attention call 911.  If you would like help to quit smoking, call 1-800-QUIT-NOW 832-730-5750) OR Espaol: 1-855-Djelo-Ya (3-419-622-2979) o para ms informacin haga clic aqu or Text READY to 200-400 to register via text  Martin Mullen - following are the goals we discussed in your visit today:   Goals Addressed    Timeframe:  Long-Range Goal Priority:  High Start Date:   02/16/22                          Expected End Date:  ongoing                     Follow Up Date  03/15/22   - practice safe sex - schedule appointment for flu shot - schedule appointment for vaccines needed due to my age or health - schedule recommended health tests  - schedule and keep appointment for annual check-up    Why is this important?   Screening tests can find diseases  early when they are easier to treat.  Your doctor or nurse will talk with you about which tests are important for you.  Getting shots for common diseases like the flu and shingles will help prevent them.    Patient verbalizes understanding of instructions and care plan provided today and agrees to view in [place]. Active MyChart status and patient understanding of how to access instructions and care plan via MyChart confirmed with patient.     The Managed Medicaid care management team will reach out to the patient again over the next 30 business days days.  The  Patient has been provided with contact information for the Managed Medicaid care management team and has been advised to call with any health related questions or concerns.   Martin Raider RN, BSN [place] Management Coordinator - Managed Medicaid High Risk [phone]   Following is a copy of your plan of care:  Care Plan : [place] of Care  Updates made by [name], RN since 02/16/2022 12:00 AM     Problem: Health Promotion or Disease Self-Management (General Plan of Care)      Long-Range Goal: Chronic Disease Management and Care Coordination Needs   Start Date: 02/16/2022  Expected End Date: 05/19/2022  Priority: High  Note:   Current Barriers:  Knowledge Deficits related to plan of care for management of CAD, HTN, HLD, DMII, Anxiety, Depression, GERD, and Tobacco Use  Care Coordination needs related to Transportation, Mental Health Concerns , and Substance abuse issues -  smokes 1ppd Chronic Disease Management support and education needs related to CAD, HTN, HLD, DMII, Anxiety, Depression, GERD, and Tobacco Use  Transportation barriers Non-adherence to prescribed medication regimen  RNCM Clinical Goal(s):  Patient will verbalize understanding of plan for management of CAD, HTN, HLD, DMII, Anxiety, Depression, GERD, and Tobacco Use as evidenced by patient report verbalize  basic understanding of  CAD, HTN, HLD, DMII, Anxiety, Depression, GERD, and Tobacco Use disease process and self health management plan as evidenced by patient report take all medications exactly as prescribed and will call provider for medication related questions as evidenced by patient report demonstrate understanding of rationale for each prescribed medication as evidenced by report attend all scheduled medical appointments as evidenced by patient report demonstrate Improved adherence to prescribed treatment plan for CAD, HTN, HLD, DMII, Anxiety, Depression, GERD, and Tobacco Use as evidenced by patient report continue to work with RN Care Manager to address care management and care coordination needs related to  CAD, HTN, HLD, DMII, Anxiety, Depression, GERD, and Tobacco Use as evidenced by adherence to CM Team Scheduled appointments work with pharmacist to address Limited education about medications * related toCAD, HTN, HLD, DMII, Anxiety, Depression, GERD, and Tobacco Use as evidenced by review or EMR and patient or pharmacist report work with Education officer, museum to address  related to the Sports coach, Medication procurement, Mental Health Concerns , and Substance abuse issues related to the management of CAD, HTN, HLD, DMII, Anxiety, Depression, GERD, and Tobacco Use as evidenced by review of EMR and patient or Education officer, museum report through collaboration with Consulting civil engineer, provider, and care team.   Interventions: Inter-disciplinary care team collaboration (see longitudinal plan of care) Evaluation of current treatment plan related to  self management and patient's adherence to plan as established by provider  CAD Interventions: (Status:  New goal.) Long Term Goal Assessed understanding of CAD diagnosis Medications reviewed including medications utilized in CAD treatment plan Reviewed Importance of taking all medications as prescribed Reviewed Importance of attending all  scheduled provider appointments Assessed social determinant of health barriers  Diabetes Interventions:  (Status:  New goal.) Long Term Goal Assessed patient's understanding of A1c goal: <7% Reviewed medications with patient and discussed importance of medication adherence Counseled on importance of regular laboratory monitoring as prescribed Discussed plans with patient for ongoing care management follow up and provided patient with direct contact information for care management team Reviewed scheduled/upcoming provider appointments  Advised patient, providing education and rationale, to check cbg as directed  and record, calling provider  for findings outside established parameters Referral made to pharmacy team for assistance with mediation review Referral made to social work team for assistance with h/o mental health concerns, transportation, medication procurement Review of patient status, including review of consultants reports, relevant laboratory and other test results, and medications completed Assessed social determinant of health barriers Lab Results  Component Value Date   HGBA1C 14.0 (A) 02/08/2022   HGBA1C >14.0 02/08/2022  Hyperlipidemia Interventions:  (Status:  New goal.) Long Term Goal Medication review performed; medication list updated in electronic medical record.  Counseled on importance of regular laboratory monitoring as prescribed Reviewed importance of limiting foods high in cholesterol Assessed social determinant of health barriers   Hypertension Interventions:  (Status:  New goal.)  Long Term Goal Last practice recorded BP readings:  BP Readings from Last 3 Encounters:  02/08/22 118/88  01/25/22 (!) 115/91  09/28/21 (!) 135/99  Most recent eGFR/CrCl:  Lab Results  Component Value Date   EGFR 96 02/08/2022    No components found for: "CRCL"  Evaluation of current treatment plan related to hypertension self management and patient's adherence to plan as  established by provider Reviewed medications with patient and discussed importance of compliance Discussed plans with patient for ongoing care management follow up and provided patient with direct contact information for care management team Reviewed scheduled/upcoming provider appointments including:  Assessed social determinant of health barriers  Smoking Cessation Interventions:  (Status:  New goal.) Long Term Goal Reviewed smoking history:   currently smoking 1 ppd  Evaluation of current treatment plan reviewed Reviewed scheduled/upcoming provider appointments Referred patient to pharmacy team Discussed plans with patient for ongoing care management follow up and provided patient with direct contact information for care management team Assessed social determinant of health barriers  Patient Goals/Self-Care Activities: Take all medications as prescribed Attend all scheduled provider appointments Call pharmacy for medication refills 3-7 days in advance of running out of medications Perform all self care activities independently  Perform IADL's (shopping, preparing meals, housekeeping, managing finances) independently Call provider office for new concerns or questions  Work with the social worker to address care coordination needs and will continue to work with the clinical team to address health care and disease management related needs  Follow Up Plan:  The patient has been provided with contact information for the care management team and has been advised to call with any health related questions or concerns.  The care management team will reach out to the patient again over the next 30 business  days.

## 2022-02-17 ENCOUNTER — Other Ambulatory Visit: Payer: Medicaid Other

## 2022-02-17 NOTE — Patient Instructions (Signed)
Visit Information  Martin Mullen was given information about Medicaid Managed Care team care coordination services as a part of their [facility] Medicaid benefit. Martin Mullen verbally consented to engagement with the [facility] Managed Care team.   If you are experiencing a medical emergency, please call 911 or report to your local emergency department or urgent care.   If you have a non-emergency medical problem during routine business hours, please contact your provider's office and ask to speak with a nurse.   For questions related to your [facility] health plan, please call: [phone] or go here:[email or web address]  If you would like to schedule transportation through your [facility] plan, please call the following number at least 2 days in advance of your appointment: [phone].  You can also use the MTM portal or MTM mobile app to manage your rides. For the portal, please go to [email or web address].  Call the [place] at [phone], at any time, 24 hours a day, 7 days a week. If you are in danger or need immediate medical attention call 911.  If you would like help to quit smoking, call 1-800-QUIT-NOW 847-480-3431) OR Espaol: 1-855-Djelo-Ya (6-384-665-9935) o para ms informacin haga clic aqu or Text READY to 200-400 to register via text  Martin Mullen - following are the goals we discussed in your visit today:   Goals Addressed   None       Social Worker will follow up on 03/09/22.   Martin Mullen, BSW, [place] Managed Medicaid Team  [phone]   Following is a copy of your plan of care:  There are no care plans that you recently modified to display for this patient.

## 2022-02-17 NOTE — Patient Outreach (Signed)
Medicaid Managed Care Social Work Note  02/17/2022 Name:  Martin Mullen MRN:  [id number] DOB:  [birth year]  Martin Mullen is an 42 y.o. year old male who is a primary patient of Martin Fenton, NP.  The [facility] Managed Care Coordination team was consulted for assistance with:  Community Resources   Mr. Martin Mullen was given information about Medicaid Managed Care Coordination team services today. Martin Mullen Patient agreed to services and verbal consent obtained.  Engaged with patient  for by telephone forinitial visit in response to referral for case management and/or care coordination services.   Assessments/Interventions:  Review of past medical history, allergies, medications, health status, including review of consultants reports, laboratory and other test data, was performed as part of comprehensive evaluation and provision of chronic care management services.  SDOH: (Social Determinant of Health) assessments and interventions performed: SDOH Interventions    Flowsheet Row Patient Outreach Telephone from 02/16/2022 in [place] Office Visit from 12/07/2020 in [facility] Office Visit from 10/07/2020 in [facility] Office Visit from 09/02/2020 in [place]  SDOH Interventions      Transportation Interventions Payor Benefit  [patient prfovided with [place] Medicaid transportation information] -- -- --  Depression Interventions/Treatment  -- Currently on Treatment Currently on Treatment Currently on Treatment     BSW completed a telephone outreach with patient. He is currently living with someone and does not have any income. He has applied for disability and is waiting for a decision. Patient is wanting to move. BSW will send patient resources for housing in [place]. BSW also informed patient he can contact his insurance provider to transportation before any appointments.  Advanced Directives  Status:  Not addressed in this encounter.  Care Plan                 No Known Allergies  Medications Reviewed Today     Reviewed by [name], RN (Registered Nurse) on 02/16/22 at 1336  Med List Status: <None>   Medication Order Taking? Sig Documenting Provider Last Dose Status Informant  Accu-Chek Softclix Lancets lancets [id number] No Use As Directed  Patient not taking: Reported on 02/16/2022   Mullen, Martin Huguenin, MD Not Taking Active   acetaminophen (TYLENOL) 500 MG tablet [id number] No Take 2 tablets (1,000 mg total) by mouth every 6 (six) hours as needed.  Patient not taking: Reported on 02/16/2022   [name], PA Not Taking Active   apixaban (ELIQUIS) 5 MG TABS tablet [id number] Yes Take 1 tablet (5 mg total) by mouth 2 (two) times daily. Martin Fenton, NP Taking Active   aspirin EC 81 MG tablet [id number] No Take 1 tablet (81 mg total) by mouth daily.  Patient not taking: Reported on 02/16/2022   Martin Fenton, NP Not Taking Active   Blood Glucose Monitoring Suppl (ACCU-CHEK GUIDE) w/Device KIT [id number] No use to check blood sugars up to 4 times daily.  Patient not taking: Reported on 02/16/2022   Mullen, Martin Huguenin, MD Not Taking Active   buPROPion (WELLBUTRIN XL) 150 MG 24 hr tablet [id number] Yes Take 1 tablet (150 mg total) by mouth daily. Martin Fenton, NP Taking Active   CONTOUR NEXT TEST test strip [id number] No USE 1 STRIP TO CHECK GLUCOSE THREE TIMES DAILY AS NEEDED  Patient not taking: Reported on 02/16/2022   Martin Fenton, NP Not Taking Active Self  food thickener (SIMPLYTHICK, NECTAR/LEVEL  2/MILDLY THICK,) GEL [id number] No Take 10 packets by mouth as needed.  Patient not taking: Reported on 02/16/2022   Mullen, Martin Huguenin, MD Not Taking Active   glucose blood test strip [id number] No Use As Directed  Patient not taking: Reported on 02/16/2022   Mullen, Martin Huguenin, MD Not Taking Active   insulin glargine-yfgn (SEMGLEE) 100 UNIT/ML Pen [id number] No Inject  12 Units into the skin daily.  Patient not taking: Reported on 02/16/2022   Martin Fenton, NP Not Taking Active   Insulin Pen Needle 32G X 4 MM MISC [id number] No Use As Directed  Patient not taking: Reported on 02/16/2022   Mullen, Martin Huguenin, MD Not Taking Active   metFORMIN (GLUCOPHAGE) 1000 MG tablet [id number] Yes Take 1 tablet (1,000 mg total) by mouth 2 (two) times daily with a meal. TAKE 1 TABLET(1000 MG) BY MOUTH TWICE DAILY WITH A MEAL [name], [name], NP Taking Active   methocarbamol (ROBAXIN) 500 MG tablet [id number] No Take 500 mg by mouth in the morning and at bedtime.  Patient not taking: Reported on 02/16/2022   [provider] Not Taking Active   Multiple Vitamin (MULTIVITAMIN WITH MINERALS) TABS tablet [id number] No Take 1 tablet by mouth daily. One-A-Day Multivitamin  Patient not taking: Reported on 02/16/2022   Mullen, Martin Huguenin, MD Not Taking Active   rosuvastatin (CRESTOR) 20 MG tablet [id number] No Take 1 tablet (20 mg total) by mouth daily.  Patient not taking: Reported on 02/16/2022   Martin Fenton, NP Not Taking Active             Patient Active Problem List   Diagnosis Date Noted   Stroke [facility]) 09/11/2021   Overweight with body mass index (BMI) of 27 to 27.9 in adult 10/08/2020   Recurrent strokes ([place]) 08/27/2020   GERD (gastroesophageal reflux disease) 07/20/2019   Coronary artery disease    Type 2 diabetes mellitus with hyperlipidemia ([place]) 07/09/2013   STEMI s/p RCA stent 08/2012 09/05/2012   HTN (hypertension) 09/05/2012   HLD (hyperlipidemia) 09/05/2012   Adjustment disorder with mixed anxiety and depressed mood 09/05/2012    Conditions to be addressed/monitored per PCP order:   housing resources  There are no care plans that you recently modified to display for this patient.   Follow up:  Patient agrees to Care Plan and Follow-up.  Plan: The Managed Medicaid care management team will reach out to the patient again over the next 14  days.  Date/time of next scheduled Social Work care management/care coordination outreach:  03/09/22  [name], Martin Mullen, [place] Medicaid Team  [phone]

## 2022-02-21 DIAGNOSIS — I69351 Hemiplegia and hemiparesis following cerebral infarction affecting right dominant side: Secondary | ICD-10-CM | POA: Diagnosis not present

## 2022-02-21 DIAGNOSIS — G9349 Other encephalopathy: Secondary | ICD-10-CM | POA: Diagnosis not present

## 2022-02-21 DIAGNOSIS — D6852 Prothrombin gene mutation: Secondary | ICD-10-CM | POA: Diagnosis not present

## 2022-03-01 ENCOUNTER — Other Ambulatory Visit: Payer: Medicaid Other | Admitting: Pharmacist

## 2022-03-02 ENCOUNTER — Encounter: Payer: Self-pay | Admitting: Pharmacist

## 2022-03-02 ENCOUNTER — Other Ambulatory Visit: Payer: Medicaid Other | Admitting: Pharmacist

## 2022-03-02 NOTE — Patient Instructions (Signed)
Goals Addressed             This Visit's Progress    Pharmacy Goals       Our goal A1c is less than 7%. This corresponds with fasting sugars less than 130 and 2 hour after meal sugars less than 180. Please keep a log of your results when checking your blood sugar   Our goal bad cholesterol, or LDL, is less than 70 . This is why it is important to continue taking your rosuvastatin.  Please reach out to [place] benefit at [phone] to order your aspirin 81 mg  Wallace Cullens, PharmD, [place], [place] [phone]

## 2022-03-02 NOTE — Progress Notes (Signed)
03/02/2022 Name: Martin Mullen MRN: [id number] DOB: [birth year]  Chief Complaint  Patient presents with   Medication Management   Medication Assistance    Martin Mullen is a 42 y.o. year old male who presented for a telephone visit.   They were referred to the pharmacist by their PCP for assistance in managing diabetes and medication access.   Patient is participating in a Managed Medicaid Plan:  [name] Managed Medicaid   Subjective:  Care Team: Primary Care Provider: [name], Martin Mullen ; Next Scheduled Visit: 05/10/2022 Managed Medicaid RN: Martin Medicus, RN; Next Scheduled Visit: 03/15/2022 Managed Medicaid Social Worker: Martin Mullen, Martin Mullen, [place]  Medication Access/Adherence  Current Pharmacy:  [id number] Martin Mullen, [facility] [address] [place] [address] Phone: [phone] Fax: [facility] [address] [place] [address] Phone: [phone] Fax: [phone]   Patient reports affordability concerns with their medications: Yes  Patient reports access/transportation concerns to their pharmacy: No  Patient reports adherence concerns with their medications:  No    Reports uses a weekly pillbox to organize his medications   Diabetes:  Current medications:  - metformin 1000 mg twice daily  Reports tolerating well since restarted - Reports has been unable to restart Lantus 12 units daily as prescribed by PCP due to cost  Medications tried in the past/contraindications: glipizide, Januvia; note would avoid SGLT2-inhibitor as patient with history of Fournier gangrene  Current glucose readings: Denies monitoring recently Reports has a home glucometer and testing supplies  Current meal patterns:  - Breakfast: often skips - Lunch: 2 cheese dogs - Supper: BBQ sandwich - Snacks: none - Drinks: sweet tea and regular Gatorade    Patient notes he has limited ability to chew as has top dentures, but no bottom  Reports plans to work with insurance to obtain bottom dentures  Current physical activity: walks around within home, but no other exercise    Hyperlipidemia/ASCVD Risk Reduction  Current lipid lowering medications: rosuvastatin 20 mg daily  Antiplatelet regimen: Eliquis 5 mg twice daily - Identify patient not currently taking aspirin 81 mg. Reports stopped due to cost  ASCVD History: medical history significant of STEMI s/p RCA stent 2014, hypertension, recurrent strokes, heterozygous prothrombin gene mutation on Eliquis, insulin-dependent type 2 diabetes  Risk Factors: tobacco use, uncontrolled T2DM  Current physical activity: walks around within home, but no other exercise    Objective:  Lab Results  Component Value Date   HGBA1C 14.0 (A) 02/08/2022   HGBA1C >14.0 02/08/2022    Lab Results  Component Value Date   CREATININE 1.01 02/08/2022   BUN 13 02/08/2022   NA 134 (L) 02/08/2022   K 4.5 02/08/2022   CL 96 (L) 02/08/2022   CO2 28 02/08/2022    Lab Results  Component Value Date   CHOL 220 (H) 02/08/2022   HDL 29 (L) 02/08/2022   LDLCALC 138 (H) 02/08/2022   LDLDIRECT 126.0 06/20/2017   TRIG 346 (H) 02/08/2022   CHOLHDL 7.6 (H) 02/08/2022    Medications Reviewed Today     Reviewed by Martin Mullen, Martin Mullen (Pharmacist) on 03/02/22 at 1117  Med List Status: <None>   Medication Order Taking? Sig Documenting Provider Last Dose Status Informant  Accu-Chek Softclix Lancets lancets [id number]  Use As Directed  Patient not taking: Reported on 02/16/2022   Martin Mullen, Martin Huguenin,  Martin Mullen  Active   acetaminophen (TYLENOL) 500 MG tablet [id number] No Take 2 tablets (1,000 mg total) by mouth every 6 (six) hours as needed.  Patient not taking: Reported on 02/16/2022   Martin Spray, Martin Mullen Not Taking Active   apixaban (ELIQUIS) 5 MG TABS tablet [id number] Yes Take 1 tablet (5 mg total) by  mouth 2 (two) times daily. Martin Mullen Taking Active   aspirin EC 81 MG tablet [id number]  Take 1 tablet (81 mg total) by mouth daily.  Patient not taking: Reported on 02/16/2022   Martin Mullen  Active   Blood Glucose Monitoring Suppl (ACCU-CHEK GUIDE) w/Device KIT [id number]  use to check blood sugars up to 4 times daily.  Patient not taking: Reported on 02/16/2022   Martin Mullen  Active   buPROPion (WELLBUTRIN XL) 150 MG 24 hr tablet [id number] Yes Take 1 tablet (150 mg total) by mouth daily. Martin Mullen Taking Active   CONTOUR NEXT TEST test strip [id number]  USE 1 STRIP TO CHECK GLUCOSE THREE TIMES DAILY AS NEEDED  Patient not taking: Reported on 02/16/2022   Martin Mullen  Active Self  food thickener (SIMPLYTHICK, NECTAR/LEVEL 2/MILDLY THICK,) GEL [id number] No Take 10 packets by mouth as needed.  Patient not taking: Reported on 02/16/2022   Martin Mullen Not Taking Active   glucose blood test strip [id number]  Use As Directed  Patient not taking: Reported on 02/16/2022   Martin Mullen  Active   insulin glargine-yfgn (SEMGLEE) 100 UNIT/ML Pen [id number]  Inject 12 Units into the skin daily.  Patient not taking: Reported on 02/16/2022   Martin Mullen  Active   Insulin Pen Needle 32G X 4 MM MISC [id number]  Use As Directed  Patient not taking: Reported on 02/16/2022   Martin Mullen  Active   metFORMIN (GLUCOPHAGE) 1000 MG tablet [id number] Yes Take 1 tablet (1,000 mg total) by mouth 2 (two) times daily with a meal. TAKE 1 TABLET(1000 MG) BY MOUTH TWICE DAILY WITH A MEAL Martin Mullen Taking Active   Multiple Vitamin (MULTIVITAMIN WITH MINERALS) TABS tablet [id number] No Take 1 tablet by mouth daily. One-A-Day Multivitamin  Patient not taking: Reported on 02/16/2022   Martin Mullen Not Taking Active   rosuvastatin (CRESTOR) 20 MG tablet [id number] Yes Take 1 tablet (20 mg total) by mouth daily. Martin Mullen Taking  Active   sildenafil (VIAGRA) 50 MG tablet [id number] Yes Take 1 tablet (50 mg total) by mouth daily as needed for erectile dysfunction. Martin Mullen Taking Active               Assessment/Plan:   Counsel on importance of medication adherence, particularly for stroke prevention  Diabetes: - Currently uncontrolled - Reviewed long term cardiovascular and renal outcomes of uncontrolled blood sugar - Outreach to [employer] on behalf of patient today  From review of Medicaid prescription formulary, note that Lantus is preferred rather than [employer] advises new Rx needed Send message to PCP requesting provider resend patient's insulin glargine Rx for Lantus, rather than Semglee - Reviewed dietary modifications including working toward having regular well-balanced meals spread throughout the day, while controlling carbohydrate portion sizes  Encourage patient to cut back on consumption of sugary beverages  Encourage patient to work on incorporating non-starchy vegetables into his meals - Counsel on technique to improve comfort with blood sugar monitoring -  Recommend to restart checking blood glucose, keep log of results and have this record to review during next appointment   Hyperlipidemia/ASCVD Risk Reduction: - Currently uncontrolled based on latest lipid panel lab - Reviewed long term complications of uncontrolled cholesterol - Reviewed rational for medication adherence for secondary stroke prevention - Identify patient has Over the Counter benefit available through [facility]. Recommend to order aspirin 81 mg through benefit and restart taking as directed  As requested, will send patient a MyChart message with [facility] OTC benefit online catalogue link and phone number   Follow Up Plan: Clinical Pharmacist will follow up with patient by telephone again on 03/21/2022 at 11:15 am  Estelle Grumbles, PharmD, [facility] Clinical Pharmacist [facility] [phone]

## 2022-03-03 ENCOUNTER — Other Ambulatory Visit: Payer: Self-pay | Admitting: Internal Medicine

## 2022-03-03 MED ORDER — LANTUS SOLOSTAR 100 UNIT/ML ~~LOC~~ SOPN: 12.0000 [IU] | PEN_INJECTOR | Freq: Every day | SUBCUTANEOUS | 1 refills | Status: DC

## 2022-03-15 ENCOUNTER — Other Ambulatory Visit: Payer: Medicaid Other | Admitting: Obstetrics and Gynecology

## 2022-03-15 ENCOUNTER — Other Ambulatory Visit: Payer: Medicaid Other

## 2022-03-15 NOTE — Patient Instructions (Signed)
Hi Mr. Vecchio, sorry to have missed you today - as a part of your Medicaid benefit, you are eligible for care management and care coordination services at no cost or copay. I was unable to reach you by phone today but would be happy to help you with your health related needs. Please feel free to call me at [phone].  A member of the Managed Medicaid care management team will reach out to you again over the next 30 business days.   Aida Raider RN, BSN [place]  [facility] - Managed Medicaid High Risk [phone]

## 2022-03-15 NOTE — Patient Outreach (Signed)
  Medicaid Managed Care   Unsuccessful Attempt Note   03/15/2022 Name: Martin Mullen MRN: [id number] DOB: April 04, 1980  Referred by: [name], NP Reason for referral : High Risk Managed Medicaid (Unsuccessful telephone outreach)   A second unsuccessful telephone outreach was attempted today. The patient was referred to the case management team for assistance with care management and care coordination.    Follow Up Plan: The Managed Medicaid care management team will reach out to the patient again over the next 30 business  days. and The  Patient has been provided with contact information for the Managed Medicaid care management team and has been advised to call with any health related questions or concerns.    Aida Raider RN, BSN [place]  [facility] - Managed Medicaid High Risk [phone]

## 2022-03-15 NOTE — Patient Instructions (Signed)
  Medicaid Managed Care   Unsuccessful Outreach Note  03/15/2022 Name: DERRIK MCEACHERN MRN: [id number] DOB: [birth year]  Referred by: [name], NP Reason for referral : High Risk Managed Medicaid (Mm Social work telephone outreach )   An unsuccessful telephone outreach was attempted today. The patient was referred to the case management team for assistance with care management and care coordination.   Follow Up Plan: A HIPAA compliant phone message was left for the patient providing contact information and requesting a return call.   [name], BSW, [place] Managed Medicaid Team  [phone]

## 2022-03-15 NOTE — Patient Outreach (Signed)
  Medicaid Managed Care   Unsuccessful Outreach Note  03/15/2022 Name: Tereso L Marcom MRN: [id number] DOB: 08/24/1980  Referred by: [name], Regina W, NP Reason for referral : High Risk Managed Medicaid (Mm Social work telephone outreach )   An unsuccessful telephone outreach was attempted today. The patient was referred to the case management team for assistance with care management and care coordination.   Follow Up Plan: A HIPAA compliant phone message was left for the patient providing contact information and requesting a return call.   Tyse Auriemma, BSW, MHA [facility]  [place]  High Risk Managed Medicaid Team  [phone]  

## 2022-03-17 DIAGNOSIS — Z419 Encounter for procedure for purposes other than remedying health state, unspecified: Secondary | ICD-10-CM | POA: Diagnosis not present

## 2022-03-21 ENCOUNTER — Other Ambulatory Visit: Payer: Self-pay | Admitting: Internal Medicine

## 2022-03-21 ENCOUNTER — Ambulatory Visit: Payer: Medicaid Other | Admitting: Pharmacist

## 2022-03-21 DIAGNOSIS — E1169 Type 2 diabetes mellitus with other specified complication: Secondary | ICD-10-CM

## 2022-03-21 MED ORDER — GLUCOSE BLOOD VI STRP: ORAL_STRIP | 0 refills | Status: DC

## 2022-03-21 MED ORDER — ACCU-CHEK SOFTCLIX LANCETS MISC: 0 refills | Status: DC

## 2022-03-21 NOTE — Progress Notes (Signed)
Can you send in lancets and strips for Accu-Chek to test 3 times daily and try to call patient advise him to cut his Lantus to 15 units daily.

## 2022-03-21 NOTE — Patient Instructions (Signed)
Goals Addressed             This Visit's Progress    Pharmacy Goals       Our goal A1c is less than 7%. This corresponds with fasting sugars less than 130 and 2 hour after meal sugars less than 180. Please keep a log of your results when checking your blood sugar   Our goal bad cholesterol, or LDL, is less than 70 . This is why it is important to continue taking your rosuvastatin.  Please reach out to [place] benefit at [phone] to order your aspirin 81 mg   Wallace Cullens, PharmD, [place], [place] [phone]

## 2022-03-21 NOTE — Progress Notes (Signed)
03/21/2022 Name: Martin Mullen MRN: [id number] DOB: 10-20-1980  Chief Complaint  Patient presents with   Medication Management   Medication Assistance   Medication Adherence    Martin Mullen is a 42 y.o. year old male who presented for a telephone visit.   They were referred to the pharmacist by their PCP for assistance in managing diabetes and medication access.   Patient is participating in a Managed Medicaid Plan:  Martin Mullen Managed Medicaid     Subjective:  Care Team: Primary Care Provider: [name], Mullen ; Next Scheduled Visit: 05/10/2022 Managed Medicaid RN: Martin Medicus, RN Managed Medicaid Social Worker: Martin Mullen, [place], [place]  Medication Access/Adherence  Current Pharmacy:  [facility] [place] [facility] [address] [place] [address] Phone: [phone] Fax: [facility] [address] [place] [address] Phone: [phone] Fax: [phone]   Patient reports affordability concerns with their medications: Yes  Patient reports access/transportation concerns to their pharmacy: No  Patient reports adherence concerns with their medications:  No     Reports uses a weekly pillbox to organize his medications     Diabetes:   Current medications:  - metformin 1000 mg twice daily - Reports was able to pick up and restart taking Lantus. However, find patient restarted with/currently taking Lantus 30 units daily, rather than as prescribed by PCP (Lantus 12 units daily)  States he decided to start at higher dose as recalled having been on this dose in the past   Medications tried in the past/contraindications: glipizide, Januvia; note would avoid SGLT2-inhibitor as patient with history of Fournier gangrene   Current glucose readings: Again denies monitoring recently Reports found that he does not have test strips for his  current Accu-Chek Guide glucometer  Patient denies hypoglycemic s/sx including dizziness, shakiness, sweating.   Today patient denies having made dietary modification to reduce sugary beverage consumption or control carbohydrate portion sizes  Patient notes he has limited ability to chew as has top dentures, but no bottom             Reports plans to work with insurance to obtain bottom dentures   Current physical activity: walks around within home, but no other exercise      Hyperlipidemia/ASCVD Risk Reduction   Current lipid lowering medications: rosuvastatin 20 mg daily   Antiplatelet regimen: Eliquis 5 mg twice daily - Identify patient still not currently taking aspirin 81 mg. Reported stopped due to cost Confirms has phone number as provided by Clinical Pharmacist for [name] Over the Counter benefit to order the aspirin, but has not called   ASCVD History: medical history significant of STEMI s/p RCA stent 2014, hypertension, recurrent strokes, heterozygous prothrombin gene mutation on Eliquis, insulin-dependent type 2 diabetes  Risk Factors: tobacco use, uncontrolled T2DM   Current physical activity: walks around within home, but no other exercise   Objective:  Lab Results  Component Value Date   HGBA1C 14.0 (A) 02/08/2022   HGBA1C >14.0 02/08/2022    Lab Results  Component Value Date   CREATININE 1.01 02/08/2022   BUN 13 02/08/2022   NA 134 (L) 02/08/2022   K 4.5 02/08/2022   CL 96 (L) 02/08/2022   CO2 28 02/08/2022    Lab Results  Component Value Date   CHOL 220 (H) 02/08/2022   HDL 29 (L)  02/08/2022   LDLCALC 138 (H) 02/08/2022   LDLDIRECT 126.0 06/20/2017   TRIG 346 (H) 02/08/2022   CHOLHDL 7.6 (H) 02/08/2022    Medications Reviewed Today     Reviewed by Martin Mullen, Martin Mullen (Pharmacist) on 03/02/22 at 1117  Med List Status: <None>   Medication Order Taking? Sig Documenting Provider Last Dose Status Informant  Accu-Chek  Softclix Lancets lancets [id number]  Use As Directed  Patient not taking: Reported on 02/16/2022   Martin Mullen  Active   acetaminophen (TYLENOL) 500 MG tablet [id number] No Take 2 tablets (1,000 mg total) by mouth every 6 (six) hours as needed.  Patient not taking: Reported on 02/16/2022   Martin Spray, PA Not Taking Active   apixaban (ELIQUIS) 5 MG TABS tablet [id number] Yes Take 1 tablet (5 mg total) by mouth 2 (two) times daily. Martin Mullen Taking Active   aspirin EC 81 MG tablet [id number]  Take 1 tablet (81 mg total) by mouth daily.  Patient not taking: Reported on 02/16/2022   Martin Mullen  Active   Blood Glucose Monitoring Suppl (ACCU-CHEK GUIDE) w/Device KIT [id number]  use to check blood sugars up to 4 times daily.  Patient not taking: Reported on 02/16/2022   Martin Mullen  Active   buPROPion (WELLBUTRIN XL) 150 MG 24 hr tablet [id number] Yes Take 1 tablet (150 mg total) by mouth daily. Martin Mullen Taking Active   CONTOUR NEXT TEST test strip [id number]  USE 1 STRIP TO CHECK GLUCOSE THREE TIMES DAILY AS NEEDED  Patient not taking: Reported on 02/16/2022   Martin Mullen  Active Self  food thickener (SIMPLYTHICK, NECTAR/LEVEL 2/MILDLY THICK,) GEL [id number] No Take 10 packets by mouth as needed.  Patient not taking: Reported on 02/16/2022   Martin Mullen Not Taking Active   glucose blood test strip [id number]  Use As Directed  Patient not taking: Reported on 02/16/2022   Martin Mullen  Active   insulin glargine-yfgn (SEMGLEE) 100 UNIT/ML Pen [id number]  Inject 12 Units into the skin daily.  Patient not taking: Reported on 02/16/2022   Martin Mullen  Active   Insulin Pen Needle 32G X 4 MM MISC [id number]  Use As Directed  Patient not taking: Reported on 02/16/2022   Martin Mullen  Active   metFORMIN (GLUCOPHAGE) 1000 MG tablet [id number] Yes Take 1 tablet (1,000 mg total) by mouth 2 (two) times daily with a meal.  TAKE 1 TABLET(1000 MG) BY MOUTH TWICE DAILY WITH A MEAL Martin Mullen Taking Active   Multiple Vitamin (MULTIVITAMIN WITH MINERALS) TABS tablet [id number] No Take 1 tablet by mouth daily. One-A-Day Multivitamin  Patient not taking: Reported on 02/16/2022   Martin Mullen Not Taking Active   rosuvastatin (CRESTOR) 20 MG tablet [id number] Yes Take 1 tablet (20 mg total) by mouth daily. Martin Mullen Taking Active   sildenafil (VIAGRA) 50 MG tablet [id number] Yes Take 1 tablet (50 mg total) by mouth daily as needed for erectile dysfunction. Martin Mullen Taking Active               Assessment/Plan:    Again counsel on importance of medication adherence, for stroke prevention and safety   Diabetes: - Currently uncontrolled - Reviewed long term cardiovascular and renal outcomes of uncontrolled blood sugar - Counsel on importance of blood sugar monitoring for safety and management of  blood sugar During last call patient reported having testing supplies, but today says that he does not have strips to go with current meter Will collaborate with PCP to request provider send Rx for Accu-Chek Guide test strips and Accu-Chek Softclix lancets to pharmacy for patient - Discuss importance of medication adherence and importance of collaborating with provider rather than self adjusting medications  Let patient know that I will contact PCP regarding his Lantus and then call him back - Collaborate with PCP today to let provider know that patient restarted taking Lantus insulin, BUT started with/currently taking Lantus 30 units daily, rather than as prescribed by PCP (Lantus 12 units daily) and denies monitoring home blood sugar  Provider advises patient to reduce Lantus dose to 15 units daily Attempt to return call to patient today x 3 to advise patient of above recommendation from provider, but unsuccessful Follow up with PCP to request office attempt to reach patient with this  guidance from provider - Reviewed dietary modifications including working toward having regular well-balanced meals spread throughout the day, while controlling carbohydrate portion sizes             Again encourage patient to cut back on consumption of sugary beverages - Counsel on symptoms of hypoglycemia - Counsel on technique to improve comfort with blood sugar monitoring - Again recommend to restart checking blood glucose, keep log of results, have this record to review during next appointment, but to contact office sooner for readings outside of established parameters of for symptoms     Hyperlipidemia/ASCVD Risk Reduction: - Currently uncontrolled based on latest lipid panel lab - Reviewed long term complications of uncontrolled cholesterol - Again reviewed rational for medication adherence for secondary stroke prevention - Patient confirms having provided phone number for Over the Counter benefit, but denies having called to order aspirin 81 mg through benefit and restart taking as directed      Follow Up Plan: Clinical Pharmacist will follow up with patient by telephone again on 04/06/2022 at 11:15 am   Wallace Cullens, PharmD, [place] [phone]

## 2022-03-22 ENCOUNTER — Telehealth: Payer: Self-pay

## 2022-03-22 ENCOUNTER — Other Ambulatory Visit: Payer: Self-pay

## 2022-03-22 MED ORDER — ACCU-CHEK SOFTCLIX LANCETS MISC: 5 refills | Status: DC

## 2022-03-22 MED ORDER — ACCU-CHEK GUIDE VI STRP: ORAL_STRIP | 5 refills | Status: DC

## 2022-03-22 NOTE — Telephone Encounter (Signed)
Patient called, left VM to return the call to the office to speak to a nurse. 

## 2022-03-22 NOTE — Telephone Encounter (Signed)
..   Medicaid Managed Care   Unsuccessful Outreach Note  03/22/2022 Name: EYAL GREENHAW MRN: [id number] DOB: 1980-12-07  Referred by: [name], NP Reason for referral : Appointment (I called the patient today to get both of his missed phone appointments rescheduled with the MM BSW and the MM RNCM. I left my name and number on his VM.)   A second unsuccessful telephone outreach was attempted today. The patient was referred to the case management team for assistance with care management and care coordination.   Follow Up Plan: A HIPAA compliant phone message was left for the patient providing contact information and requesting a return call.  The care management team will reach out to the patient again over the next 7 days.   [place]

## 2022-03-22 NOTE — Telephone Encounter (Signed)
Author: [name], NP Service: Family Medicine Author Type: Nurse Practitioner  Filed: 03/21/2022  3:22 PM Encounter Date: 03/21/2022 Status: Signed  Editor: [name], NP (Nurse Practitioner)   Can you send in lancets and strips for Accu-Chek to test 3 times daily and try to call patient advise him to cut his Lantus to 15 units daily.

## 2022-03-22 NOTE — Telephone Encounter (Signed)
LMTCB 03/22/2022.    PEC please advise pt that his test strips and lancet have been sent to his pharmacy and also advise him to cut his Lantus to 15 units daily.   Thanks,   -Mickel Baas

## 2022-03-29 ENCOUNTER — Telehealth: Payer: Self-pay

## 2022-03-29 NOTE — Telephone Encounter (Signed)
..   Medicaid Managed Care   Unsuccessful Outreach Note  03/29/2022 Name: Martin Mullen MRN: [id number] DOB: 03-30-1980  Referred by: [name], NP Reason for referral : Appointment (I called the patient today to reschedule his two missed phone appointments with the MM BSW and the MM RNCM. )   Third unsuccessful telephone outreach was attempted today. The patient was referred to the case management team for assistance with care management and care coordination. The patient's primary care provider has been notified of our unsuccessful attempts to make or maintain contact with the patient. The care management team is pleased to engage with this patient at any time in the future should he/she be interested in assistance from the care management team.   Follow Up Plan: We have been unable to make contact with the patient for follow up. The care management team is available to follow up with the patient after provider conversation with the patient regarding recommendation for care management engagement and subsequent re-referral to the care management team.   [place], [place]

## 2022-04-01 ENCOUNTER — Other Ambulatory Visit: Payer: Medicaid Other | Admitting: Obstetrics and Gynecology

## 2022-04-01 NOTE — Patient Instructions (Signed)
Visit Information  Mr. Martin Mullen  - as a part of your Medicaid benefit, you are eligible for care management and care coordination services at no cost or copay. I was unable to reach you by phone today but would be happy to help you with your health related needs. Please feel free to call me at [phone]   Aida Raider RN, [place] Management Coordinator - Managed [place] High Risk [phone]

## 2022-04-01 NOTE — Patient Outreach (Cosign Needed)
Care Coordination  04/01/2022  Tayshawn CHARMING ANTONOVICH Feb 14, 1981 [id number]   Medicaid Managed Care   Unsuccessful Outreach Note  04/01/2022 Name: ANTERO MELROY MRN: [id number] DOB: 18-Nov-1980  Referred by: [name], NP Reason for referral : High Risk Managed Medicaid (Unsuccessful telephone outreach)   Fourth  unsuccessful telephone outreach was attempted.  The patient was referred to the case management team for assistance with care management and care coordination. The patient's primary care provider has been notified of our unsuccessful attempts to make or maintain contact with the patient. The care management team is pleased to engage with this patient at any time in the future should he/she be interested in assistance from the care management team.   Follow Up Plan: We have been unable to make contact with the patient for follow up. The care management team is available to follow up with the patient after provider conversation with the patient regarding recommendation for care management engagement and subsequent re-referral to the care management team.   Aida Raider RN, BSN [place] Management Coordinator - Managed [place] High Risk [phone]

## 2022-04-03 ENCOUNTER — Encounter: Payer: Self-pay | Admitting: Internal Medicine

## 2022-04-06 ENCOUNTER — Ambulatory Visit: Payer: Medicaid Other | Admitting: Pharmacist

## 2022-04-06 DIAGNOSIS — I2510 Atherosclerotic heart disease of native coronary artery without angina pectoris: Secondary | ICD-10-CM

## 2022-04-06 DIAGNOSIS — E785 Hyperlipidemia, unspecified: Secondary | ICD-10-CM

## 2022-04-06 NOTE — Progress Notes (Signed)
04/06/2022 Name: Martin Mullen MRN: [id number] DOB: 06-05-1980  Chief Complaint  Patient presents with   Medication Management   Medication Adherence    Martin Mullen is a 42 y.o. year old male who presented for a telephone visit.   They were referred to the pharmacist by their PCP for assistance in managing diabetes and medication access.   Patient is participating in a Managed Medicaid Plan:   [name] Managed Medicaid    Subjective:  Care Team: Primary Care Provider: [name], NP ; Next Scheduled Visit: 05/10/2022 Managed Medicaid RN: [name], RN; Next Scheduled Visit: 03/15/2022 Managed Medicaid Social Worker: [name], BSW, [place]  Medication Access/Adherence  Current Pharmacy:  [facility] [name], [place] [address] [place] [address] Phone: [phone] Fax: [place] [address] [place] [address] Phone: [phone] Fax: [phone]   Patient reports affordability concerns with their medications: No  Patient reports access/transportation concerns to their pharmacy: No  Patient reports adherence concerns with their medications:  No    Reports uses a weekly pillbox to organize his medications   Diabetes:   Current medications:  - metformin 1000 mg twice daily - Lantus 30 units daily   Medications tried in the past/contraindications: glipizide, Januvia; note would avoid SGLT2-inhibitor as patient with history of Fournier gangrene   Reports current morning fasting glucose readings ranging 200-260s   Patient denies hypoglycemic s/sx including dizziness, shakiness, sweating   Today patient reports has slightly reduced sugary beverage consumption   Patient notes he has limited ability to chew as has top dentures, but no bottom             Reports plans to work with insurance to obtain bottom  dentures   Current physical activity: reports started walking in his driveway [id number] minutes x 3 days/week      Hyperlipidemia/ASCVD Risk Reduction   Current lipid lowering medications: rosuvastatin 20 mg daily   Antiplatelet regimen: Eliquis 5 mg twice daily - Identify patient still not currently taking aspirin 81 mg. Reported stopped due to cost Confirms has phone number as provided by Clinical Pharmacist for [place] Medicare Over the Counter benefit to order the aspirin, but has not called   ASCVD History: medical history significant of STEMI s/p RCA stent 2014, hypertension, recurrent strokes, heterozygous prothrombin gene mutation on Eliquis, insulin-dependent type 2 diabetes  Risk Factors: tobacco use, uncontrolled T2DM   Current physical activity: reports started walking in his driveway [id number] minutes x 3 days/week     Objective:  Lab Results  Component Value Date   HGBA1C 14.0 (A) 02/08/2022   HGBA1C >14.0 02/08/2022    Lab Results  Component Value Date   CREATININE 1.01 02/08/2022   BUN 13 02/08/2022   NA 134 (L) 02/08/2022   K 4.5 02/08/2022   CL 96 (L) 02/08/2022   CO2 28 02/08/2022    Lab Results  Component Value Date   CHOL 220 (H) 02/08/2022   HDL 29 (L) 02/08/2022   LDLCALC 138 (H) 02/08/2022   LDLDIRECT 126.0 06/20/2017   TRIG 346 (H) 02/08/2022   CHOLHDL 7.6 (H) 02/08/2022   BP Readings from Last 3 Encounters:  02/08/22 118/88  01/25/22 (!) 115/91  09/28/21 (!) 135/99   Pulse Readings from Last 3 Encounters:  02/08/22 98  01/25/22 (!) 103  09/28/21  94     Medications Reviewed Today     Reviewed by Rennis Petty, RPH-CPP (Pharmacist) on 03/02/22 at 1117  Med List Status: <None>   Medication Order Taking? Sig Documenting Provider Last Dose Status Informant  Accu-Chek Softclix Lancets lancets [id number]  Use As Directed  Patient not taking: Reported on 02/16/2022   [name], [name], MD  Active   acetaminophen (TYLENOL) 500 MG  tablet [id number] No Take 2 tablets (1,000 mg total) by mouth every 6 (six) hours as needed.  Patient not taking: Reported on 02/16/2022   [name], PA Not Taking Active   apixaban (ELIQUIS) 5 MG TABS tablet [id number] Yes Take 1 tablet (5 mg total) by mouth 2 (two) times daily. [name], NP Taking Active   aspirin EC 81 MG tablet [id number]  Take 1 tablet (81 mg total) by mouth daily.  Patient not taking: Reported on 02/16/2022   [name], NP  Active   Blood Glucose Monitoring Suppl (ACCU-CHEK GUIDE) w/Device KIT [id number]  use to check blood sugars up to 4 times daily.  Patient not taking: Reported on 02/16/2022   [name], [name], MD  Active   buPROPion (WELLBUTRIN XL) 150 MG 24 hr tablet [id number] Yes Take 1 tablet (150 mg total) by mouth daily. [name], NP Taking Active   CONTOUR NEXT TEST test strip [id number]  USE 1 STRIP TO CHECK GLUCOSE THREE TIMES DAILY AS NEEDED  Patient not taking: Reported on 02/16/2022   [name], NP  Active Self  food thickener (SIMPLYTHICK, NECTAR/LEVEL 2/MILDLY THICK,) GEL [id number] No Take 10 packets by mouth as needed.  Patient not taking: Reported on 02/16/2022   [name], [name], MD Not Taking Active   glucose blood test strip [id number]  Use As Directed  Patient not taking: Reported on 02/16/2022   [name], [name], MD  Active   insulin glargine-yfgn (SEMGLEE) 100 UNIT/ML Pen [id number]  Inject 12 Units into the skin daily.  Patient not taking: Reported on 02/16/2022   [name], NP  Active   Insulin Pen Needle 32G X 4 MM MISC [id number]  Use As Directed  Patient not taking: Reported on 02/16/2022   [name], [name], MD  Active   metFORMIN (GLUCOPHAGE) 1000 MG tablet [id number] Yes Take 1 tablet (1,000 mg total) by mouth 2 (two) times daily with a meal. TAKE 1 TABLET(1000 MG) BY MOUTH TWICE DAILY WITH A MEAL [name], [name], NP Taking Active   Multiple Vitamin (MULTIVITAMIN WITH MINERALS) TABS tablet [id number] No  Take 1 tablet by mouth daily. One-A-Day Multivitamin  Patient not taking: Reported on 02/16/2022   [name], [name], MD Not Taking Active   rosuvastatin (CRESTOR) 20 MG tablet [id number] Yes Take 1 tablet (20 mg total) by mouth daily. [name], NP Taking Active   sildenafil (VIAGRA) 50 MG tablet [id number] Yes Take 1 tablet (50 mg total) by mouth daily as needed for erectile dysfunction. [name], NP Taking Active               Assessment/Plan:   Again counsel on importance of medication adherence, for stroke prevention and safety   Diabetes: - Currently uncontrolled - Reviewed long term cardiovascular and renal outcomes of uncontrolled blood sugar - Reviewed dietary modifications including working toward having regular well-balanced meals spread throughout the day, while controlling carbohydrate portion sizes             Again encourage patient to  cut back on consumption of sugary beverages - Counsel on symptoms of hypoglycemia - Recommend to continue checking blood glucose, keep log of results, have this record to review during next appointment, but to contact office sooner for readings outside of established parameters of for symptoms - Will collaborate with PCP regarding reported home blood sugar readings and patient's medication management     Hyperlipidemia/ASCVD Risk Reduction: - Currently uncontrolled based on latest lipid panel lab - Reviewed long term complications of uncontrolled cholesterol - Again reviewed rational for medication adherence for secondary stroke prevention - Patient confirms having received phone number for Over the Counter benefit, but denies having called to order aspirin 81 mg through benefit and restart taking as directed      Follow Up Plan: Clinical Pharmacist will follow up with patient by telephone again within the next 7 days   Wallace Cullens, PharmD, [facility] [place] [phone]

## 2022-04-07 MED ORDER — OZEMPIC (0.25 OR 0.5 MG/DOSE) 2 MG/3ML ~~LOC~~ SOPN: 0.5000 mg | PEN_INJECTOR | SUBCUTANEOUS | 0 refills | Status: DC

## 2022-04-08 ENCOUNTER — Ambulatory Visit: Payer: Medicaid Other | Admitting: Pharmacist

## 2022-04-08 ENCOUNTER — Encounter: Payer: Self-pay | Admitting: Pharmacist

## 2022-04-08 DIAGNOSIS — I2510 Atherosclerotic heart disease of native coronary artery without angina pectoris: Secondary | ICD-10-CM

## 2022-04-08 DIAGNOSIS — E785 Hyperlipidemia, unspecified: Secondary | ICD-10-CM

## 2022-04-08 NOTE — Progress Notes (Signed)
04/08/2022 Name: Martin Mullen MRN: [id number] DOB: 15-Aug-1980  Chief Complaint  Patient presents with   Medication Management    Martin Mullen is a 42 y.o. year old male who presented for a telephone visit.   They were referred to the pharmacist by their PCP for assistance in managing diabetes and medication access.   Patient is participating in a Managed Medicaid Plan:  [name] Managed Medicaid    Subjective:   Care Team: Primary Care Provider: [name], NP ; Next Scheduled Visit: 05/10/2022 Managed Medicaid RN: [name], RN; Next Scheduled Visit: 03/15/2022 Managed Medicaid Social Worker: [name], BSW, [place]  Medication Access/Adherence  Current Pharmacy:  [facility] [name], [place] - [place] [address] Phone: [phone] Fax: [place] [address] [address] Phone: [phone] Fax: [phone]   Patient reports affordability concerns with their medications: No  Patient reports access/transportation concerns to their pharmacy: No  Patient reports adherence concerns with their medications:  No    Reports uses a weekly pillbox to organize his medications    Diabetes:   Current medications:  - metformin 1000 mg twice daily - Lantus 30 units daily - Ozempic 0.25 mg weekly on Fridays (reports started today)   Medications tried in the past/contraindications: glipizide, Januvia; note would avoid SGLT2-inhibitor as patient with history of Fournier gangrene   Sent message to PCP on 2/21 regarding patient's reported home blood sugar readings and interest in starting Ozempic to aid with blood sugar lowering and ASCVD benefit - From review of chart, note provider sent Ozempic Rx into pharmacy for patient on 2/22 - Today patient reports he already picked up Rx, reviewed administration  instructions and started Ozempic 0.25 mg weekly today  Reports recent morning fasting glucose readings: - 2/21: 258 - Today 131   Patient denies hypoglycemic s/sx including dizziness, shakiness, sweating   Today attributes variation in morning fasting blood sugar to changes in his sugary beverage consumption. Reports recently has tried to drink more water in place of sweet tea and Gatorade. Also, patient admits to sometimes missing evening dose of metformin   Patient notes he has limited ability to chew as has top dentures, but no bottom             Reports plans to work with insurance to obtain bottom dentures   Current physical activity: reports started walking in his driveway [id number] minutes x 3 days/week      Hyperlipidemia/ASCVD Risk Reduction   Current lipid lowering medications: rosuvastatin 20 mg daily   Antiplatelet regimen: Eliquis 5 mg twice daily - Not currently taking aspirin 81 mg. However, today confirms ordered this from his [place] Medicare Over the Counter benefit and plans to restart taking daily as soon as receives it   ASCVD History: medical history significant of STEMI s/p RCA stent 2014, hypertension, recurrent strokes, heterozygous prothrombin gene mutation on Eliquis, insulin-dependent type 2 diabetes  Risk Factors: tobacco use, uncontrolled T2DM   Current physical activity: reports started walking in his driveway [id number] minutes x 3 days/week     Objective:  Lab Results  Component Value Date   HGBA1C 14.0 (A) 02/08/2022   HGBA1C >14.0 02/08/2022    Lab Results  Component Value Date   CREATININE 1.01 02/08/2022   BUN 13 02/08/2022   NA 134 (L)  02/08/2022   K 4.5 02/08/2022   CL 96 (L) 02/08/2022   CO2 28 02/08/2022    Lab Results  Component Value Date   CHOL 220 (H) 02/08/2022   HDL 29 (L) 02/08/2022   LDLCALC 138 (H) 02/08/2022   LDLDIRECT 126.0 06/20/2017   TRIG 346 (H) 02/08/2022   CHOLHDL 7.6 (H) 02/08/2022   BP Readings from Last  3 Encounters:  02/08/22 118/88  01/25/22 (!) 115/91  09/28/21 (!) 135/99   Pulse Readings from Last 3 Encounters:  02/08/22 98  01/25/22 (!) 103  09/28/21 94     Medications Reviewed Today     Reviewed by Rennis Petty, RPH-CPP (Pharmacist) on 04/08/22 at [place] List Status: <None>   Medication Order Taking? Sig Documenting Provider Last Dose Status Informant  Accu-Chek Softclix Lancets lancets [id number]  Use to check blood sugar three times a day for type 2 diabetes E11.9 [name], NP  Active   acetaminophen (TYLENOL) 500 MG tablet [id number] No Take 2 tablets (1,000 mg total) by mouth every 6 (six) hours as needed.  Patient not taking: Reported on 02/16/2022   [name], PA Not Taking Active   apixaban (ELIQUIS) 5 MG TABS tablet [id number] No Take 1 tablet (5 mg total) by mouth 2 (two) times daily. [name], NP Taking Active   aspirin EC 81 MG tablet [id number] No Take 1 tablet (81 mg total) by mouth daily.  Patient not taking: Reported on 02/16/2022   [name], NP Not Taking Active   Blood Glucose Monitoring Suppl (ACCU-CHEK GUIDE) w/Device KIT [id number] No use to check blood sugars up to 4 times daily.  Patient not taking: Reported on 02/16/2022   [name], [name], MD Not Taking Active   buPROPion (WELLBUTRIN XL) 150 MG 24 hr tablet [id number] No Take 1 tablet (150 mg total) by mouth daily. [name], NP Taking Active   food thickener (SIMPLYTHICK, NECTAR/LEVEL 2/MILDLY THICK,) GEL [id number] No Take 10 packets by mouth as needed.  Patient not taking: Reported on 02/16/2022   [name], [name], MD Not Taking Active   glucose blood (ACCU-CHEK GUIDE) test strip [id number]  Use to check blood sugar three times a day for type 2 diabetes E11.9 [name], NP  Active   insulin glargine (LANTUS SOLOSTAR) 100 UNIT/ML Solostar Pen [id number]  Inject 12 Units into the skin daily. [name], NP  Active            Med Note Curley Spice, Prentice Sackrider  A   Fri Apr 08, 2022 12:32 PM) Reports taking 30 units daily  Insulin Pen Needle 32G X 4 MM MISC [id number] No Use As Directed  Patient not taking: Reported on 02/16/2022   [name], [name], MD Not Taking Active   metFORMIN (GLUCOPHAGE) 1000 MG tablet [id number] No Take 1 tablet (1,000 mg total) by mouth 2 (two) times daily with a meal. TAKE 1 TABLET(1000 MG) BY MOUTH TWICE DAILY WITH A MEAL [name], [name], NP Taking Active   Multiple Vitamin (MULTIVITAMIN WITH MINERALS) TABS tablet [id number] No Take 1 tablet by mouth daily. One-A-Day Multivitamin  Patient not taking: Reported on 02/16/2022   [name], [name], MD Not Taking Active   OZEMPIC, 0.25 OR 0.5 MG/DOSE, 2 MG/3ML SOPN [id number]  Inject 0.5 mg into the skin once a week. Start with 0.'25mg'$  weekly x 4 weeks then increase to 0.'5mg'$  weekly injection. [name], NP  Active   rosuvastatin (CRESTOR)  20 MG tablet [id number] No Take 1 tablet (20 mg total) by mouth daily. [name], NP Taking Active   sildenafil (VIAGRA) 50 MG tablet [id number] No Take 1 tablet (50 mg total) by mouth daily as needed for erectile dysfunction. [name], NP Taking Active               Assessment/Plan:   Again counsel on importance of medication adherence, for stroke prevention and safety - Encourage patient to restart taking aspirin 81 mg daily as soon as supply received - Discuss strategies to aid with adherence to evening dose of metformin, such as taking this dose at supper time   Diabetes: - Currently uncontrolled - Counseled on Ozempic, including mechanism of action, dose, side effects, and benefits. No personal or family history of medullary thyroid cancer, personal history of pancreatitis or gallbladder disease. Counseled on potential side effects of nausea, stomach upset, queasiness, constipation, and that these generally improve over time. Advised to contact our office with more severe symptoms, including nausea, diarrhea, stomach pain.  Patient verbalized understanding. - Reviewed dietary modifications including working toward having regular well-balanced meals spread throughout the day, while controlling carbohydrate portion sizes             Encourage patient to continue to cut back on consumption of sugary beverages - Counsel patient on s/s of low blood sugar and how to treat lows Review rules of 15s - importance of using 15 grams of sugar to treat low, recheck blood sugar in 15 minutes, treat again if remains low or, if back to normal, having meal if mealtime or snack  - Recommend to continue checking blood glucose, keep log of results, have this record to review during next appointment, but to contact office sooner for readings outside of established parameters of for symptoms     Hyperlipidemia/ASCVD Risk Reduction: - Currently uncontrolled based on latest lipid panel lab - Reviewed long term complications of uncontrolled cholesterol - Again reviewed rational for medication adherence for secondary stroke prevention - Encourage patient to restart taking aspirin 81 mg daily as soon as supply received     Follow Up Plan: Clinical Pharmacist will follow up with patient by telephone again on 04/18/2022 at 1:45 pm   Wallace Cullens, PharmD, [facility] [phone]

## 2022-04-08 NOTE — Patient Instructions (Signed)
Pharmacy Goals          Our goal A1c is less than 7%. This corresponds with fasting sugars less than 130 and 2 hour after meal sugars less than 180. Please keep a log of your results when checking your blood sugar    Our goal bad cholesterol, or LDL, is less than 70 . This is why it is important to continue taking your rosuvastatin.   Please reach out to [place] benefit at [phone] to order your aspirin 81 mg     Wallace Cullens, PharmD, [place], [place] [phone]

## 2022-04-08 NOTE — Patient Instructions (Signed)
Goals Addressed             This Visit's Progress    Pharmacy Goals       Ozempic may cause stomach upset, queasiness, or constipation, especially when first starting. This generally improves over time. Call our office if these symptoms occur and worsen, or if you have severe symptoms such as vomiting, diarrhea, or stomach pain.   Our goal A1c is less than 7%. This corresponds with fasting sugars less than 130 and 2 hour after meal sugars less than 180. Please keep a log of your results when checking your blood sugar   Our goal bad cholesterol, or LDL, is less than 70 . This is why it is important to continue taking your rosuvastatin.    Wallace Cullens, PharmD, [place], CPP Clinical Pharmacist [facility] [phone]

## 2022-04-11 MED ORDER — SILDENAFIL CITRATE 50 MG PO TABS: 50.0000 mg | ORAL_TABLET | Freq: Every day | ORAL | 0 refills | Status: DC | PRN

## 2022-04-18 ENCOUNTER — Ambulatory Visit: Payer: Medicaid Other | Admitting: Pharmacist

## 2022-04-18 DIAGNOSIS — I2510 Atherosclerotic heart disease of native coronary artery without angina pectoris: Secondary | ICD-10-CM

## 2022-04-18 DIAGNOSIS — E785 Hyperlipidemia, unspecified: Secondary | ICD-10-CM

## 2022-04-18 NOTE — Patient Instructions (Signed)
Goals Addressed             This Visit's Progress    Pharmacy Goals       Our goal A1c is less than 7%. This corresponds with fasting sugars less than 130 and 2 hour after meal sugars less than 180. Please keep a log of your results when checking your blood sugar   Our goal bad cholesterol, or LDL, is less than 70 . This is why it is important to continue taking your rosuvastatin.   Wallace Cullens, PharmD, [place], CPP Clinical Pharmacist [facility] [phone]

## 2022-04-18 NOTE — Progress Notes (Signed)
04/18/2022 Name: Martin Mullen MRN: [id number] DOB: Mar 21, 1980  Chief Complaint  Patient presents with   Medication Management    [name] is a 42 y.o. year old male who presented for a telephone visit.   They were referred to the pharmacist by their PCP for assistance in managing diabetes and medication access.   Patient is participating in a Managed Medicaid Plan:  [name] Medicaid    Subjective:   Care Team: Primary Care Provider: [name], NP ; Next Scheduled Visit: 05/10/2022 Managed Medicaid RN: [name], RN Managed Medicaid Social Worker: [name], [place],   Medication Access/Adherence  Current Pharmacy:  [facility], [place] [facility] [address] [address] Phone: [phone] Fax: [facility] [address] [place] [address] Phone: [phone] Fax: [phone]   Patient reports affordability concerns with their medications: No  Patient reports access/transportation concerns to their pharmacy: No  Patient reports adherence concerns with their medications:  Yes, sometimes missing evening doses of metformin    Reports uses a weekly pillbox to organize his medications     Diabetes:   Current medications:  - metformin 1000 mg twice daily - Lantus 12 units daily (reports last week decreased to dose previously recommended by PCP) - Ozempic 0.25 mg weekly on Fridays (started 2/23)   Medications tried in the past/contraindications: glipizide, Januvia; note would avoid SGLT2-inhibitor as patient with history of Fournier gangrene   Reports recent home glucose readings: - Today after lunch: 174 - 3/2: morning fasting: 120 - 2/26: morning fasting: 187   Patient denies hypoglycemic s/sx including dizziness, shakiness, sweating   Reports has noticed a significant improvement in his blood  sugar control with decreasing his sugary beverage consumption; now drinking 1 bottle of Gatorade/day, otherwise drinking water.   Admits to still often missing evening dose of metformin  Current meal patterns:  - Breakfast: often skips - Lunch: 2 cheese dogs - Supper: BBQ sandwich - Snacks: none - Drinks: water and regular Gatorade   Patient notes he has limited ability to chew as has top dentures, but no bottom             Reports plans to work with insurance to obtain bottom dentures   Current physical activity: reports started walking in his driveway [id number] minutes x 3 days/week      Hyperlipidemia/ASCVD Risk Reduction   Current lipid lowering medications: rosuvastatin 20 mg daily   Antiplatelet regimen: aspirin 81 mg (note patient also on Eliquis 5 mg twice daily)  ASCVD History: medical history significant of STEMI s/p RCA stent 2014, hypertension, recurrent strokes, heterozygous prothrombin gene mutation on Eliquis, insulin-dependent type 2 diabetes  Risk Factors: tobacco use, uncontrolled T2DM   Current physical activity: reports started walking in his driveway [id number] minutes x 3 days/week     Objective:  Lab Results  Component Value Date   HGBA1C 14.0 (A) 02/08/2022   HGBA1C >14.0 02/08/2022    Lab Results  Component Value Date   CREATININE 1.01 02/08/2022   BUN 13 02/08/2022   NA 134 (L) 02/08/2022   K 4.5 02/08/2022   CL 96 (L) 02/08/2022   CO2 28 02/08/2022    Lab Results  Component Value Date   CHOL 220 (H) 02/08/2022   HDL 29 (L) 02/08/2022   LDLCALC 138 (H) 02/08/2022  LDLDIRECT 126.0 06/20/2017   TRIG 346 (H) 02/08/2022   CHOLHDL 7.6 (H) 02/08/2022   BP Readings from Last 3 Encounters:  02/08/22 118/88  01/25/22 (!) 115/91  09/28/21 (!) 135/99   Pulse Readings from Last 3 Encounters:  02/08/22 98  01/25/22 (!) 103  09/28/21 94     Medications Reviewed Today     Reviewed by Rennis Petty, RPH-CPP (Pharmacist) on 04/18/22 at 1413   Med List Status: <None>   Medication Order Taking? Sig Documenting Provider Last Dose Status Informant  Accu-Chek Softclix Lancets lancets [id number]  Use to check blood sugar three times a day for type 2 diabetes E11.9 [name], NP  Active   acetaminophen (TYLENOL) 500 MG tablet [id number]  Take 2 tablets (1,000 mg total) by mouth every 6 (six) hours as needed.  Patient not taking: Reported on 02/16/2022   [name], PA  Active   apixaban (ELIQUIS) 5 MG TABS tablet [id number]  Take 1 tablet (5 mg total) by mouth 2 (two) times daily. [name], NP  Active   aspirin EC 81 MG tablet [id number] Yes Take 1 tablet (81 mg total) by mouth daily. [name], NP Taking Active   Blood Glucose Monitoring Suppl (ACCU-CHEK GUIDE) w/Device KIT [id number]  use to check blood sugars up to 4 times daily.  Patient not taking: Reported on 02/16/2022   [name], [name], MD  Active   buPROPion (WELLBUTRIN XL) 150 MG 24 hr tablet [id number]  Take 1 tablet (150 mg total) by mouth daily. [name], NP  Active   food thickener (SIMPLYTHICK, NECTAR/LEVEL 2/MILDLY THICK,) GEL [id number]  Take 10 packets by mouth as needed.  Patient not taking: Reported on 02/16/2022   [name], [name], MD  Active   glucose blood (ACCU-CHEK GUIDE) test strip [id number]  Use to check blood sugar three times a day for type 2 diabetes E11.9 [name], NP  Active   insulin glargine (LANTUS SOLOSTAR) 100 UNIT/ML Solostar Pen [id number] Yes Inject 12 Units into the skin daily. [name], NP Taking Active            Med Note Wallace Cullens A   Mon Apr 18, 2022  2:13 PM)    Insulin Pen Needle 32G X 4 MM MISC [id number]  Use As Directed  Patient not taking: Reported on 02/16/2022   [name], [name], MD  Active   metFORMIN (GLUCOPHAGE) 1000 MG tablet [id number]  Take 1 tablet (1,000 mg total) by mouth 2 (two) times daily with a meal. TAKE 1 TABLET(1000 MG) BY MOUTH TWICE DAILY WITH A MEAL [name], [name], NP   Active   Multiple Vitamin (MULTIVITAMIN WITH MINERALS) TABS tablet [id number]  Take 1 tablet by mouth daily. One-A-Day Multivitamin  Patient not taking: Reported on 02/16/2022   [name], [name], MD  Active   OZEMPIC, 0.25 OR 0.5 MG/DOSE, 2 MG/3ML SOPN [id number]  Inject 0.5 mg into the skin once a week. Start with 0.'25mg'$  weekly x 4 weeks then increase to 0.'5mg'$  weekly injection. [name], NP  Active   rosuvastatin (CRESTOR) 20 MG tablet [id number]  Take 1 tablet (20 mg total) by mouth daily. [name], NP  Active   sildenafil (VIAGRA) 50 MG tablet [id number]  Take 1 tablet (50 mg total) by mouth daily as needed for erectile dysfunction. [name], NP  Active  Assessment/Plan:   - Discuss strategies to aid with adherence to evening dose of metformin, such as taking this dose at supper time and using daily reminder alarm on his phone  Patient states will set reminder alarm for 4:30 pm daily   Diabetes: - Currently uncontrolled - Reviewed dietary modifications including working toward having regular well-balanced meals spread throughout the day, while controlling carbohydrate portion sizes             Encourage patient to continue to cut back on consumption of sugary beverages  Discuss strategies to increase intake of non-starchy vegetables, such as broccoli - Counsel patient on s/s of low blood sugar and how to treat lows Review rules of 15s - importance of using 15 grams of sugar to treat low, recheck blood sugar in 15 minutes, treat again if remains low or, if back to normal, having meal if mealtime or snack  Encourage patient to pick up and carry glucose tablets - Recommend to continue checking blood glucose, keep log of results, have this record to review during next appointment, but to contact office sooner for readings outside of established parameters of for symptoms     Hyperlipidemia/ASCVD Risk Reduction: - Remind patient of follow up appointment  with PCP on 3/26 - Have discussed rational for medication adherence for secondary stroke prevention      Follow Up Plan: Clinical Pharmacist will follow up with patient by telephone again on 06/10/2022 at 11 am   Wallace Cullens, PharmD, [place] [phone]

## 2022-05-10 ENCOUNTER — Encounter: Payer: Self-pay | Admitting: Internal Medicine

## 2022-05-10 ENCOUNTER — Ambulatory Visit (INDEPENDENT_AMBULATORY_CARE_PROVIDER_SITE_OTHER): Payer: Medicaid Other | Admitting: Internal Medicine

## 2022-05-10 VITALS — BP 136/84 | HR 93 | Temp 97.1°F | Wt 204.0 lb

## 2022-05-10 DIAGNOSIS — E1169 Type 2 diabetes mellitus with other specified complication: Secondary | ICD-10-CM

## 2022-05-10 DIAGNOSIS — I2510 Atherosclerotic heart disease of native coronary artery without angina pectoris: Secondary | ICD-10-CM | POA: Diagnosis not present

## 2022-05-10 DIAGNOSIS — E785 Hyperlipidemia, unspecified: Secondary | ICD-10-CM | POA: Diagnosis not present

## 2022-05-10 DIAGNOSIS — Z6829 Body mass index (BMI) 29.0-29.9, adult: Secondary | ICD-10-CM

## 2022-05-10 DIAGNOSIS — I10 Essential (primary) hypertension: Secondary | ICD-10-CM | POA: Diagnosis not present

## 2022-05-10 DIAGNOSIS — F4323 Adjustment disorder with mixed anxiety and depressed mood: Secondary | ICD-10-CM

## 2022-05-10 DIAGNOSIS — I639 Cerebral infarction, unspecified: Secondary | ICD-10-CM | POA: Diagnosis not present

## 2022-05-10 DIAGNOSIS — K219 Gastro-esophageal reflux disease without esophagitis: Secondary | ICD-10-CM

## 2022-05-10 DIAGNOSIS — E663 Overweight: Secondary | ICD-10-CM

## 2022-05-10 DIAGNOSIS — I213 ST elevation (STEMI) myocardial infarction of unspecified site: Secondary | ICD-10-CM

## 2022-05-10 LAB — POCT GLYCOSYLATED HEMOGLOBIN (HGB A1C): Hemoglobin A1C: 8.2 % — AB (ref 4.0–5.6)

## 2022-05-10 MED ORDER — LANTUS SOLOSTAR 100 UNIT/ML ~~LOC~~ SOPN: 16.0000 [IU] | PEN_INJECTOR | Freq: Every day | SUBCUTANEOUS | 0 refills | Status: DC

## 2022-05-10 MED ORDER — LOSARTAN POTASSIUM 25 MG PO TABS: 25.0000 mg | ORAL_TABLET | Freq: Every day | ORAL | 0 refills | Status: DC

## 2022-05-10 NOTE — Assessment & Plan Note (Signed)
C-Met and lipid profile today Continue rosuvastatin, aspirin and Eliquis Will start losartan for better blood pressure control

## 2022-05-10 NOTE — Assessment & Plan Note (Signed)
Continue bupropion Support offered 

## 2022-05-10 NOTE — Assessment & Plan Note (Signed)
Try to identify and avoid foods that trigger reflux Encourage weight loss as this can help reduce reflux symptoms He does not want to add any additional medications at this time due to to financial cost Encouraged him to try Tums OTC

## 2022-05-10 NOTE — Assessment & Plan Note (Signed)
C-Met and lipid profile today Encouraged him to consume low-fat diet Continue rosuvastatin 

## 2022-05-10 NOTE — Assessment & Plan Note (Signed)
POCT A1c 8.2% Urine microalbumin has been checked within the last year Encouraged to consume a low-carb diet and exercise for weight loss Increase Lantus to 16 units daily Continue metformin and Ozempic Encouraged him to obtain his diabetic eye exam Encouraged routine eye exam Encouraged him to get a flu shot in the fall Will get Pneumovax at his next visit Encouraged him to get a COVID-vaccine

## 2022-05-10 NOTE — Assessment & Plan Note (Signed)
C-Met and lipid profile today Continue rosuvastatin, aspirin and Eliquis Will start losartan for better blood pressure control.

## 2022-05-10 NOTE — Assessment & Plan Note (Signed)
No recent angina C-Met lipid profile today Continue rosuvastatin, aspirin and Eliquis

## 2022-05-10 NOTE — Assessment & Plan Note (Signed)
Encourage diet and exercise for weight loss 

## 2022-05-10 NOTE — Assessment & Plan Note (Signed)
Will start losartan 25 mg daily Reinforced DASH diet and exercise for weight loss C-Met today

## 2022-05-10 NOTE — Patient Instructions (Signed)

## 2022-05-10 NOTE — Progress Notes (Signed)
Subjective:    Patient ID: Martin Mullen, male    DOB: Oct 06, 1980, 42 y.o.   MRN: [id number]  HPI  Patient presents to clinic today for 71-month follow-up of chronic conditions.  HTN: His BP today is 136/84.  He is not taking any antihypertensive medications at this time.  ECG from 08/2021 reviewed.  HLD with CAD status post STEMI, History of Multiple Strokes: Residual cognitive effects.  His last LDL was 138, triglycerides 346, 01/2022.  He denies myalgias on Rosuvastatin.  He is taking Aspirin and Eliquis as well.  He does not consume low-fat diet.  He does not follow with cardiology or neurology.  DM2: His last A1c was 14, 01/2022.  He is taking Metformin, Lantus and Ozempic as prescribed.  H his sugars range 180-212.  He does not check his feet routinely.  His last eye exam was > 1 year ago.  Flu 11/2020.  Pneumovax 06/2017.  COVID never.  GERD: He is not what triggers this. He reports he is unable to afford medication for this.  There is no upper GI on file.  Anxiety and Depression: Chronic, managed on Bupropion.  He is not currently seeing a therapist.  He denies SI/HI.  ED: He is taking Sildenafil as prescribed but does not feel like the dose is effective.  He does not follow with urology.  Review of Systems     Past Medical History:  Diagnosis Date   Acute transmural inferior wall MI ([facility]) 08/28/2012   .5 x 12 mm Veri-flex stent non-DES.   Chicken pox    Coronary artery disease    Inferior ST elevation myocardial infarction in July of 2014. Cardiac catheterization showed 95% distal RCA stenosis and 90% first diagonal stenosis with normal ejection fraction. He underwent PCI in bare-metal stent placement to the distal RCA.   Diabetes mellitus without complication ([place])    Hypercholesterolemia    Stroke ([facility])    Tobacco use     Current Outpatient Medications  Medication Sig Dispense Refill   Accu-Chek Softclix Lancets lancets Use to check blood sugar three times a day for  type 2 diabetes E11.9 100 each 5   acetaminophen (TYLENOL) 500 MG tablet Take 2 tablets (1,000 mg total) by mouth every 6 (six) hours as needed. (Patient not taking: Reported on 02/16/2022) 100 tablet 2   apixaban (ELIQUIS) 5 MG TABS tablet Take 1 tablet (5 mg total) by mouth 2 (two) times daily. 180 tablet 0   aspirin EC 81 MG tablet Take 1 tablet (81 mg total) by mouth daily. 90 tablet 0   Blood Glucose Monitoring Suppl (ACCU-CHEK GUIDE) w/Device KIT use to check blood sugars up to 4 times daily. (Patient not taking: Reported on 02/16/2022) 1 kit 0   buPROPion (WELLBUTRIN XL) 150 MG 24 hr tablet Take 1 tablet (150 mg total) by mouth daily. 90 tablet 0   food thickener (SIMPLYTHICK, NECTAR/LEVEL 2/MILDLY THICK,) GEL Take 10 packets by mouth as needed. (Patient not taking: Reported on 02/16/2022) 100 packet 0   glucose blood (ACCU-CHEK GUIDE) test strip Use to check blood sugar three times a day for type 2 diabetes E11.9 100 strip 5   insulin glargine (LANTUS SOLOSTAR) 100 UNIT/ML Solostar Pen Inject 12 Units into the skin daily. 15 mL 1   Insulin Pen Needle 32G X 4 MM MISC Use As Directed (Patient not taking: Reported on 02/16/2022) 100 each 0   metFORMIN (GLUCOPHAGE) 1000 MG tablet Take 1 tablet (1,000 mg total)  by mouth 2 (two) times daily with a meal. TAKE 1 TABLET(1000 MG) BY MOUTH TWICE DAILY WITH A MEAL 180 tablet 0   Multiple Vitamin (MULTIVITAMIN WITH MINERALS) TABS tablet Take 1 tablet by mouth daily. One-A-Day Multivitamin (Patient not taking: Reported on 02/16/2022) 30 tablet 0   OZEMPIC, 0.25 OR 0.5 MG/DOSE, 2 MG/3ML SOPN Inject 0.5 mg into the skin once a week. Start with 0.25mg  weekly x 4 weeks then increase to 0.5mg  weekly injection. 9 mL 0   rosuvastatin (CRESTOR) 20 MG tablet Take 1 tablet (20 mg total) by mouth daily. 90 tablet 0   sildenafil (VIAGRA) 50 MG tablet Take 1 tablet (50 mg total) by mouth daily as needed for erectile dysfunction. 10 tablet 0   No current facility-administered  medications for this visit.    No Known Allergies  Family History  Problem Relation Age of Onset   Arthritis Mother        Rheumatoid   Diabetes Mother    Hyperlipidemia Mother    Diabetes Father    Cancer Maternal Grandfather        Prostate   Parkinson's disease Maternal Grandfather     Social History   Socioeconomic History   Marital status: Divorced    Spouse name: Not on file   Number of children: 2   Years of education: Not on file   Highest education level: Not on file  Occupational History   Not on file  Tobacco Use   Smoking status: Every Day    Packs/day: 1.00    Years: 30.00    Additional pack years: 0.00    Total pack years: 30.00    Types: Cigarettes   Smokeless tobacco: Never  Vaping Use   Vaping Use: Never used  Substance and Sexual Activity   Alcohol use: Not Currently    Alcohol/week: 0.0 standard drinks of alcohol    Comment: occasional   Drug use: Not Currently    Types: Marijuana   Sexual activity: Yes    Birth control/protection: None  Other Topics Concern   Not on file  Social History Narrative   Lives with girlfriend and her mother    Social Determinants of Health   Financial Resource Strain: Not on file  Food Insecurity: Not on file  Transportation Needs: Unmet Transportation Needs (02/16/2022)   PRAPARE - Hydrologist (Medical): Yes    Lack of Transportation (Non-Medical): No  Physical Activity: Not on file  Stress: Not on file  Social Connections: Not on file  Intimate Partner Violence: Not At Risk (02/16/2022)   Humiliation, Afraid, Rape, and Kick questionnaire    Fear of Current or Ex-Partner: No    Emotionally Abused: No    Physically Abused: No    Sexually Abused: No     Constitutional: Denies fever, malaise, fatigue, headache or abrupt weight changes.  HEENT: Denies eye pain, eye redness, ear pain, ringing in the ears, wax buildup, runny nose, nasal congestion, bloody nose, or sore  throat. Respiratory: Denies difficulty breathing, shortness of breath, cough or sputum production.   Cardiovascular: Denies chest pain, chest tightness, palpitations or swelling in the hands or feet.  Gastrointestinal: Patient reports reflux.  Denies abdominal pain, bloating, constipation, diarrhea or blood in the stool.  GU: Denies urgency, frequency, pain with urination, burning sensation, blood in urine, odor or discharge. Musculoskeletal: Denies decrease in range of motion, difficulty with gait, muscle pain or joint pain and swelling.  Skin: Denies redness,  rashes, lesions or ulcercations.  Neurological: Denies dizziness, difficulty with memory, difficulty with speech or problems with balance and coordination.  Psych: Patient has a history of anxiety and depression.  Denies SI/HI.  No other specific complaints in a complete review of systems (except as listed in HPI above).  Objective:   Physical Exam BP 136/84 (BP Location: Left Arm, Patient Position: Sitting, Cuff Size: Normal)   Pulse 93   Temp (!) 97.1 F (36.2 C) (Temporal)   Wt 204 lb (92.5 kg)   SpO2 98%   BMI 29.27 kg/m   Wt Readings from Last 3 Encounters:  02/08/22 191 lb (86.6 kg)  01/25/22 185 lb (83.9 kg)  09/15/21 192 lb 3.9 oz (87.2 kg)    General: Appears older than his stated age, chronically ill appearing, in NAD. Skin: Warm, dry and intact. No ulcerations noted. HEENT: Head: normal shape and size; Eyes: sclera white, no icterus, conjunctiva pink, PERRLA and EOMs intact;  Cardiovascular: Normal rate and rhythm. S1,S2 noted.  No murmur, rubs or gallops noted. No JVD or BLE edema.  Pulmonary/Chest: Normal effort and positive vesicular breath sounds. No respiratory distress. No wheezes, rales or ronchi noted.  Abdomen: Soft and nontender. Normal bowel sounds.  Musculoskeletal: Limping gait noted without device. Neurological: Alert and oriented.  He seems to have some expressive aphasia which is  chronic. Psychiatric: Mood and affect flat. Behavior is normal. Judgment and thought content normal.    BMET    Component Value Date/Time   NA 134 (L) 02/08/2022 1421   NA 140 08/11/2021 0937   NA 139 01/29/2013 0040   K 4.5 02/08/2022 1421   K 4.0 01/29/2013 0040   CL 96 (L) 02/08/2022 1421   CL 107 01/29/2013 0040   CO2 28 02/08/2022 1421   CO2 28 01/29/2013 0040   GLUCOSE 356 (H) 02/08/2022 1421   GLUCOSE 228 (H) 01/29/2013 0040   BUN 13 02/08/2022 1421   BUN 11 08/11/2021 0937   BUN 15 01/29/2013 0040   CREATININE 1.01 02/08/2022 1421   CALCIUM 10.0 02/08/2022 1421   CALCIUM 9.2 01/29/2013 0040   GFRNONAA >60 09/26/2021 0334   GFRNONAA >60 01/29/2013 0040   GFRAA 104 11/13/2019 1550   GFRAA >60 01/29/2013 0040    Lipid Panel     Component Value Date/Time   CHOL 220 (H) 02/08/2022 1421   CHOL 159 08/11/2021 0937   TRIG 346 (H) 02/08/2022 1421   HDL 29 (L) 02/08/2022 1421   HDL 26 (L) 08/11/2021 0937   CHOLHDL 7.6 (H) 02/08/2022 1421   VLDL 22 09/12/2021 0419   LDLCALC 138 (H) 02/08/2022 1421    CBC    Component Value Date/Time   WBC 9.7 02/08/2022 1421   RBC 5.63 02/08/2022 1421   HGB 18.4 (H) 02/08/2022 1421   HGB 17.9 (H) 08/11/2021 0937   HCT 52.1 (H) 02/08/2022 1421   HCT 51.0 08/11/2021 0937   PLT 252 02/08/2022 1421   PLT 261 08/11/2021 0937   MCV 92.5 02/08/2022 1421   MCV 93 08/11/2021 0937   MCV 93 01/29/2013 0040   MCH 32.7 02/08/2022 1421   MCHC 35.3 02/08/2022 1421   RDW 12.0 02/08/2022 1421   RDW 12.4 08/11/2021 0937   RDW 13.8 01/29/2013 0040   LYMPHSABS 3.9 09/15/2021 0437   LYMPHSABS 4.8 (H) 08/11/2021 0937   MONOABS 0.7 09/15/2021 0437   EOSABS 0.4 09/15/2021 0437   EOSABS 0.5 (H) 08/11/2021 0937   BASOSABS 0.1 09/15/2021 0437  BASOSABS 0.1 08/11/2021 0937    Hgb A1C Lab Results  Component Value Date   HGBA1C 14.0 (A) 02/08/2022   HGBA1C >14.0 02/08/2022           Assessment & Plan:    RTC in 3 months for your  annual exam Webb Silversmith, NP

## 2022-05-11 LAB — LIPID PANEL
Cholesterol: 141 mg/dL (ref <200)
HDL: 36 mg/dL — ABNORMAL LOW (ref >=40)
LDL Cholesterol (Calc): 83 mg/dL (calc)
Non-HDL Cholesterol (Calc): 105 mg/dL (calc) (ref <130)
Total CHOL/HDL Ratio: 3.9 (calc) (ref <5.0)
Triglycerides: 128 mg/dL (ref <150)

## 2022-05-11 LAB — COMPLETE METABOLIC PANEL WITH GFR
AG Ratio: 1.4 (calc) (ref 1.0–2.5)
ALT: 9 U/L (ref 9–46)
AST: 8 U/L — ABNORMAL LOW (ref 10–40)
Albumin: 4.2 g/dL (ref 3.6–5.1)
Alkaline phosphatase (APISO): 113 U/L (ref 36–130)
BUN: 13 mg/dL (ref 7–25)
CO2: 28 mmol/L (ref 20–32)
Calcium: 10.1 mg/dL (ref 8.6–10.3)
Chloride: 101 mmol/L (ref 98–110)
Creat: 1.03 mg/dL (ref 0.60–1.29)
Globulin: 2.9 g/dL (calc) (ref 1.9–3.7)
Glucose, Bld: 192 mg/dL — ABNORMAL HIGH (ref 65–99)
Potassium: 4.4 mmol/L (ref 3.5–5.3)
Sodium: 141 mmol/L (ref 135–146)
Total Bilirubin: 0.4 mg/dL (ref 0.2–1.2)
Total Protein: 7.1 g/dL (ref 6.1–8.1)
eGFR: 93 mL/min/{1.73_m2} (ref >=60)

## 2022-05-20 IMAGING — CT CT PELVIS W/ CM
2 of 6 series · 14 of 46 positions shown, 18 images · IV contrast (APPLIED)
Comparison: 05/05/2019

CLINICAL DATA: Abscess under the testicles. Patient is seen at [HOSPITAL]
yesterday. Abscess drain was attempted, but no fluid was obtained.
Symptoms for 5 days. Scrotal swelling.

EXAM:
CT PELVIS WITH CONTRAST
TECHNIQUE: Multidetector CT imaging of the pelvis was performed using the
standard protocol following the bolus administration of intravenous
contrast.
CONTRAST:  100mL OMNIPAQUE IOHEXOL 300 MG/ML  SOLN

[Series 2: routine abd/pel with · axial · 0.84mm/px · z∈[-668,-342]mm · 11 of 76 slices shown, 15 images]
[im 7/76  soft-tissue]
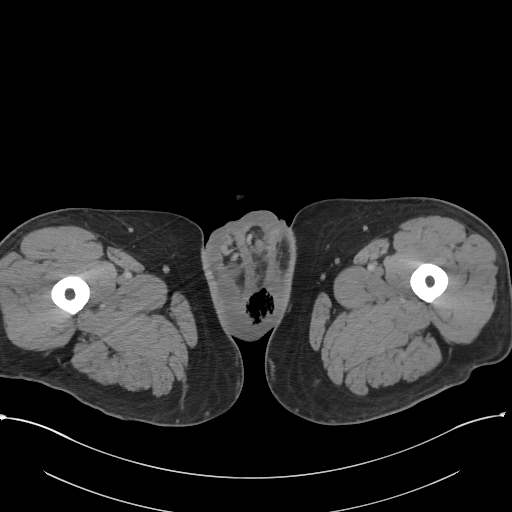
[im 7/76  bone]
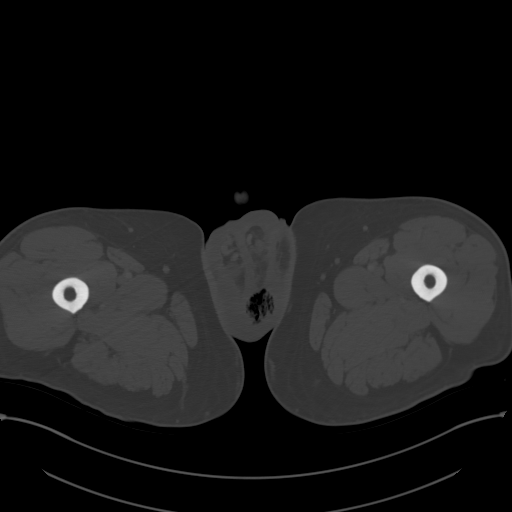
[im 14/76  soft-tissue]
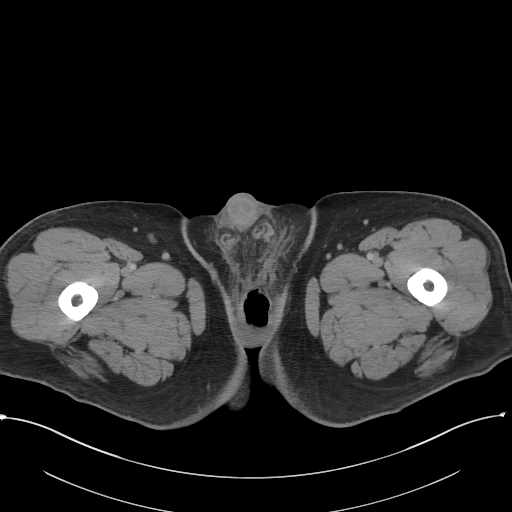
[im 23/76  soft-tissue]
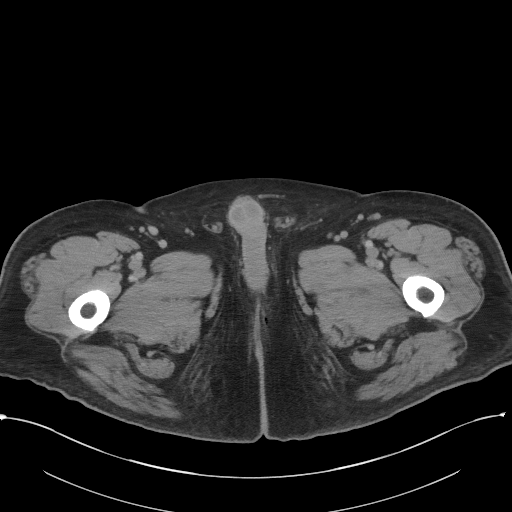
[im 30/76  soft-tissue]
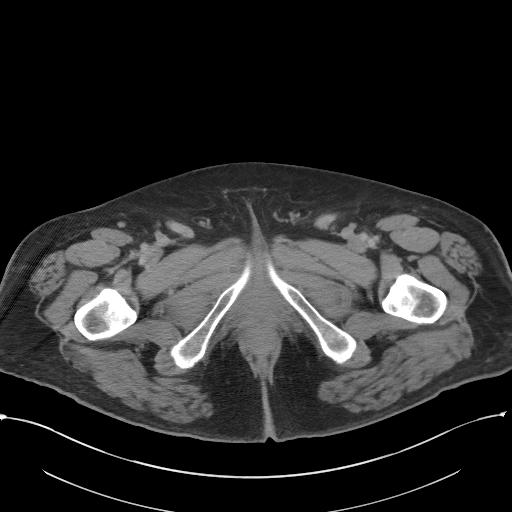
[im 40/76  soft-tissue]
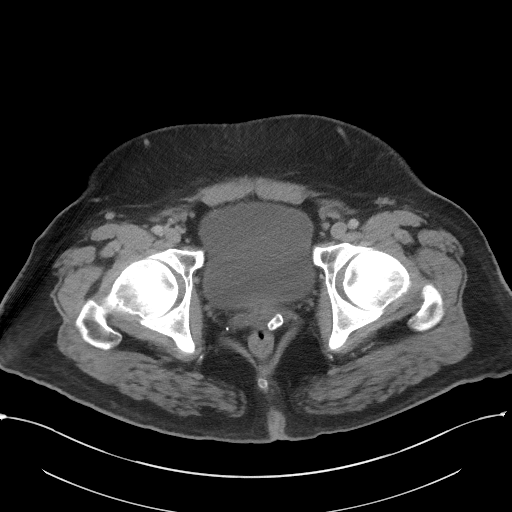
[im 46/76  soft-tissue]
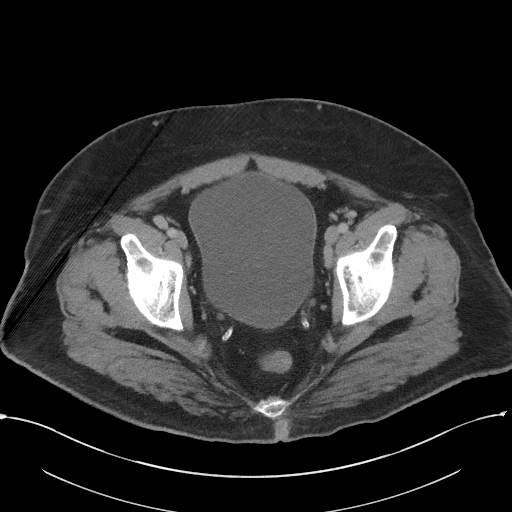
[im 53/76  soft-tissue]
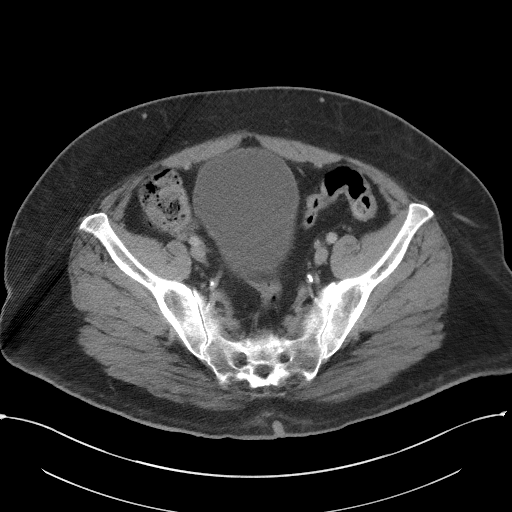
[im 62/76  soft-tissue]
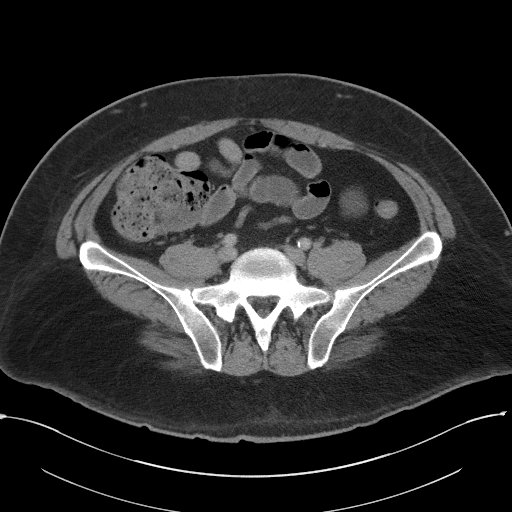
[im 62/76  lung]
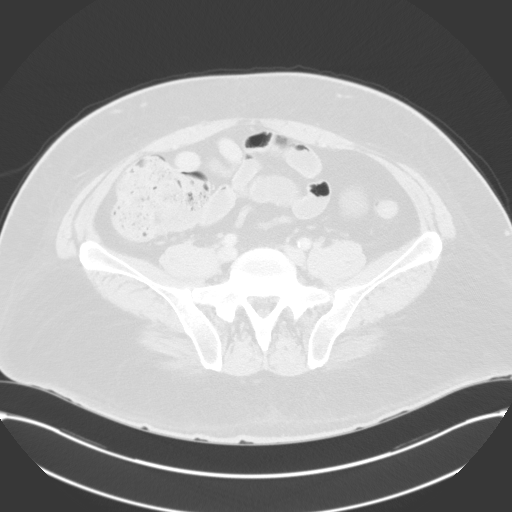
[im 66/76  lung]
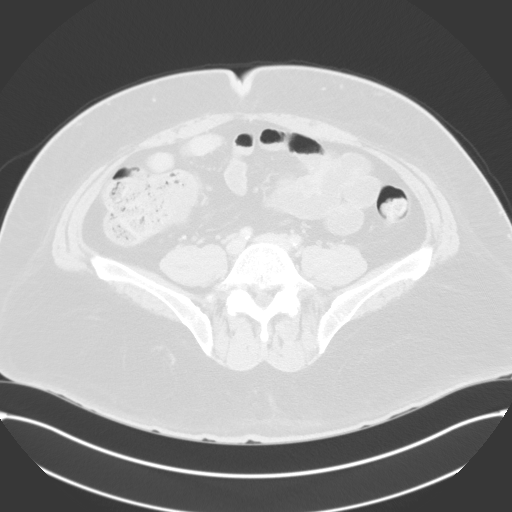
[im 69/76  soft-tissue]
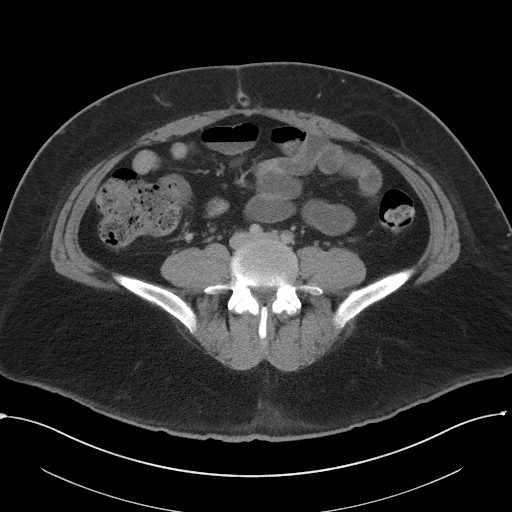
[im 69/76  lung]
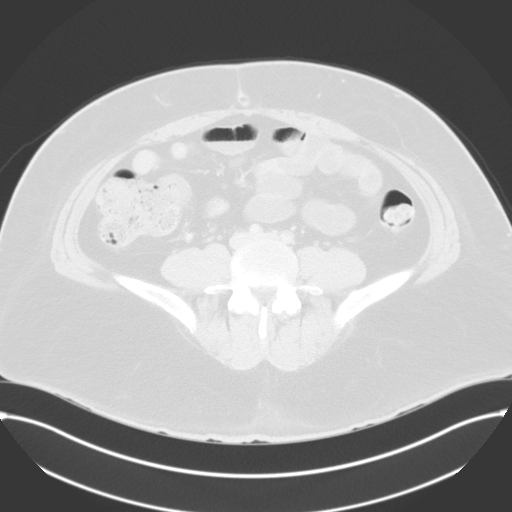
[im 69/76  bone]
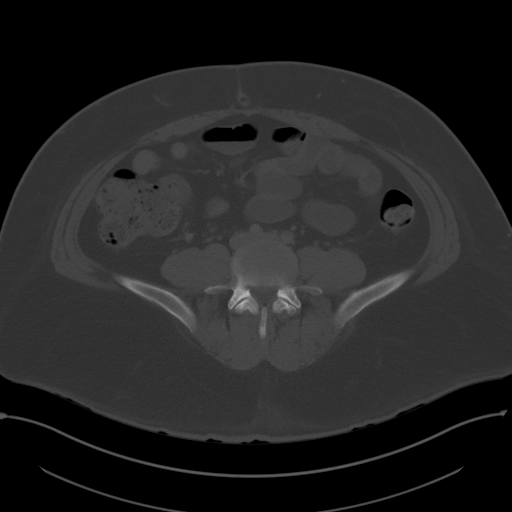
[im 72/76  lung]
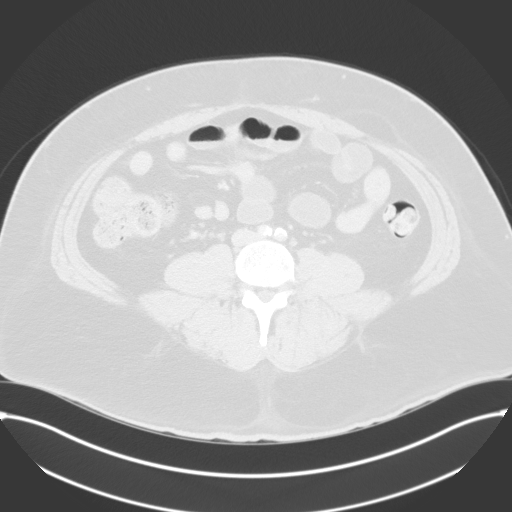

[Series 6: coronal st · coronal · 0.79mm/px · 3 of 105 slices shown]
[im 21/105  soft-tissue]
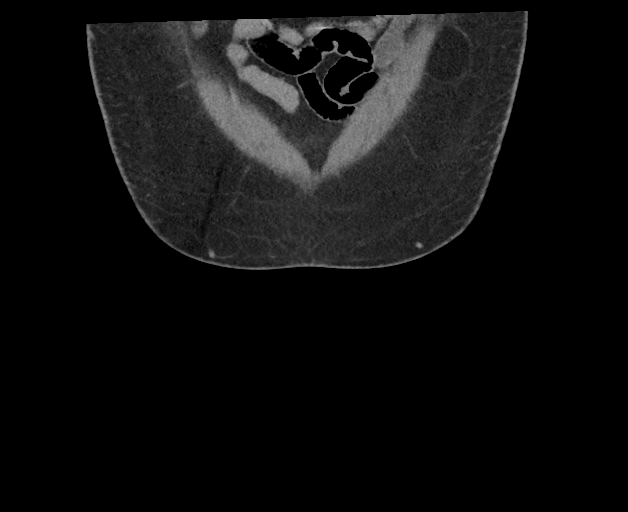
[im 42/105  soft-tissue]
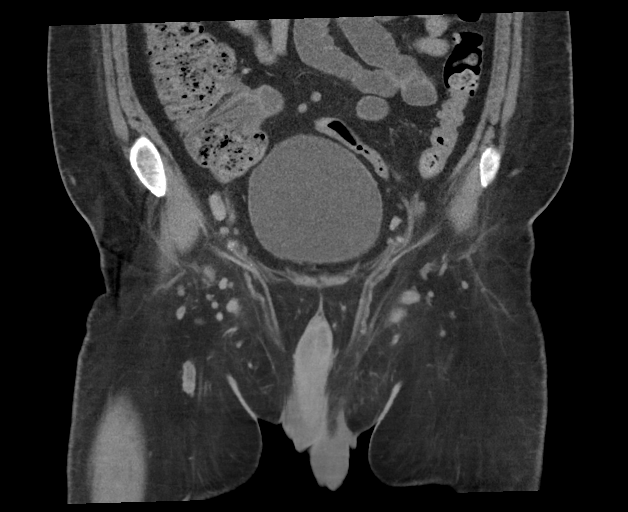
[im 63/105  soft-tissue]
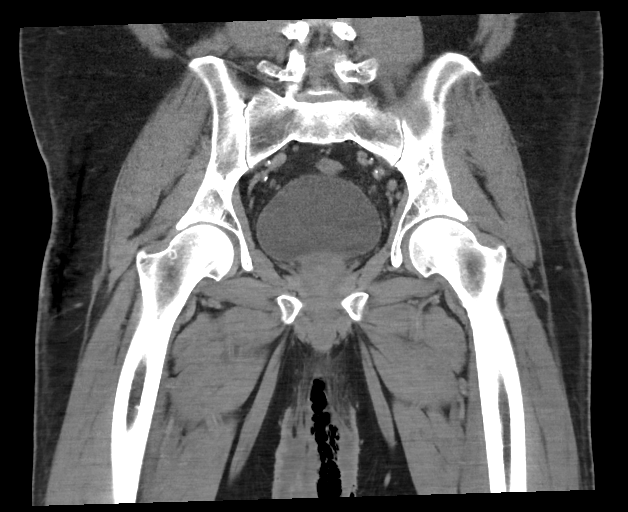

[14 of 46 positions shown; findings below may reference images not displayed]

FINDINGS: Urinary Tract: Distal ureters are unremarkable. Bladder is
distended. Visualized course of the urethra is unremarkable.

Bowel: Normal appearance of the visualized large and small bowel
loops. Normal appendix.

Vascular/Lymphatic: Mildly enlarged inguinal lymph nodes
bilaterally, LEFT greater than RIGHT. Largest lymph node measures
1.5 centimeters in short axis. There are small bilateral external
iliac lymph nodes, largest measuring 1.1 centimeters on the LEFT.

Reproductive: There are faint calcifications within the prostate
gland. Seminal vesicles are partially calcified.

There is diffuse scrotal wall swelling. Locules of gas are
identified within the posterior aspect of the scrotum, measuring up
to 3.7 x 3.1 centimeters. Air extends inferiorly he continues
anteriorly along the MEDIAL raphe of the scrotum. No definite fluid
collection identified.

Other:  Anterior abdominal wall is unremarkable.  No ascites.

Musculoskeletal: No acute abnormality.
IMPRESSION: 1. Diffuse scrotal wall swelling and gas, consistent with [place]
gangrene.
2. No definite fluid collection identified.
3. Bilateral inguinal and external iliac lymph nodes, likely
reactive.
4. Distended urinary bladder.

Critical Value/emergent results were called by telephone at the time
of interpretation on 07/20/2019 at [DATE] to provider SAMUELS
YASSIRA , who verbally acknowledged these results.

## 2022-05-21 ENCOUNTER — Encounter: Payer: Self-pay | Admitting: Internal Medicine

## 2022-06-07 ENCOUNTER — Emergency Department
Admission: EM | Admit: 2022-06-07 | Discharge: 2022-06-07 | Disposition: A | Discharge status: Home / Self Care | Payer: Medicaid Other | Attending: Emergency Medicine | Admitting: Emergency Medicine

## 2022-06-07 ENCOUNTER — Other Ambulatory Visit: Payer: Self-pay

## 2022-06-07 DIAGNOSIS — L0231 Cutaneous abscess of buttock: Secondary | ICD-10-CM | POA: Insufficient documentation

## 2022-06-07 MED ORDER — CLINDAMYCIN HCL 300 MG PO CAPS: 300.0000 mg | ORAL_CAPSULE | Freq: Four times a day (QID) | ORAL | 0 refills | Status: AC

## 2022-06-07 MED ORDER — HYDROCODONE-ACETAMINOPHEN 5-325 MG PO TABS: 1.0000 | ORAL_TABLET | ORAL | 0 refills | Status: DC | PRN

## 2022-06-07 MED ORDER — CLINDAMYCIN HCL 150 MG PO CAPS
300.0000 mg | ORAL_CAPSULE | Freq: Once | ORAL | Status: AC
  Administered 2022-06-07: 300 mg via ORAL
  Filled 2022-06-07: qty 2

## 2022-06-07 MED ORDER — OXYCODONE-ACETAMINOPHEN 5-325 MG PO TABS
1.0000 | ORAL_TABLET | Freq: Once | ORAL | Status: AC
  Administered 2022-06-07: 1 via ORAL
  Filled 2022-06-07: qty 1

## 2022-06-07 MED ORDER — LIDOCAINE HCL (PF) 1 % IJ SOLN
10.0000 mL | Freq: Once | INTRAMUSCULAR | Status: AC
  Administered 2022-06-07: 10 mL
  Filled 2022-06-07: qty 10

## 2022-06-07 NOTE — ED Provider Notes (Signed)
[facility] Provider Note  Patient Contact: 5:50 PM (approximate)   History   Abscess   HPI  Martin Mullen is a 42 y.o. male department complaining of a possible abscess to the right buttock.  Patient reports that he has a history of recurrent abscesses.  He has had foreigners gangrene as well as other abscesses.  Patient now has a lesion to the right but adjacent to the midline/intergluteal cleft.  Does not involve the intergluteal cleft and does not extend to the left side.  No fevers, chills.  No perirectal/perianal involvement.  No perineal involvement.  No other complaints at this time.     Physical Exam   Triage Vital Signs: ED Triage Vitals [06/07/22 1657]  Enc Vitals Group     BP 125/82     Pulse Rate (!) 103     Resp 18     Temp 98 F (36.7 C)     Temp src      SpO2 98 %     Weight      Height      Head Circumference      Peak Flow      Pain Score 4     Pain Loc      Pain Edu?      Excl. in GC?     Most recent vital signs: Vitals:   06/07/22 1657  BP: 125/82  Pulse: (!) 103  Resp: 18  Temp: 98 F (36.7 C)  SpO2: 98%     General: Alert and in no acute distress. Cardiovascular:  Good peripheral perfusion Respiratory: Normal respiratory effort without tachypnea or retractions. Lungs CTAB.  Musculoskeletal: Full range of motion to all extremities.  Neurologic:  No gross focal neurologic deficits are appreciated.  Skin:   No rash noted.  Visualization of the right buttock reveals a erythematous edematous area adjacent to the intergluteal cleft that is tender, fluctuant.  This measures approximately 8 cm across.  No active drainage.  Area is fluctuant.  Does not extend into the perirectal/perianal region.  Does not cross of the intergluteal cleft. Other:   ED Results / Procedures / Treatments   Labs (all labs ordered are listed, but only abnormal results are displayed) Labs Reviewed - No data to  display   EKG     RADIOLOGY    No results found.  PROCEDURES:  Critical Care performed: No  ..Incision and Drainage  Date/Time: 06/07/2022 6:55 PM  Performed by: Racheal Patches, PA-C Authorized by: Racheal Patches, PA-C   Consent:    Consent obtained:  Verbal   Consent given by:  Patient   Risks discussed:  Incomplete drainage, pain and bleeding Universal protocol:    Procedure explained and questions answered to patient or proxy's satisfaction: yes     Immediately prior to procedure, a time out was called: yes     Patient identity confirmed:  Verbally with patient Location:    Type:  Abscess   Location:  Lower extremity   Lower extremity location:  Buttock   Buttock location:  R buttock Pre-procedure details:    Skin preparation:  Povidone-iodine Sedation:    Sedation type:  None Anesthesia:    Anesthesia method:  Local infiltration   Local anesthetic:  Lidocaine 1% w/o epi Procedure type:    Complexity:  Complex Procedure details:    Incision types:  Stab incision   Incision depth:  Subcutaneous   Wound management:  Probed and deloculated,  irrigated with saline and extensive cleaning   Drainage:  Bloody and purulent   Drainage amount:  Moderate   Wound treatment:  Wound left open   Packing materials:  None Post-procedure details:    Procedure completion:  Tolerated well, no immediate complications    MEDICATIONS ORDERED IN ED: Medications  lidocaine (PF) (XYLOCAINE) 1 % injection 10 mL (10 mLs Infiltration Given by Other 06/07/22 1821)  clindamycin (CLEOCIN) capsule 300 mg (300 mg Oral Given 06/07/22 1821)  oxyCODONE-acetaminophen (PERCOCET/ROXICET) 5-325 MG per tablet 1 tablet (1 tablet Oral Given 06/07/22 1821)     IMPRESSION / MDM / ASSESSMENT AND PLAN / ED COURSE  I reviewed the triage vital signs and the nursing notes.                                 Differential diagnosis includes, but is not limited to, pilonidal cyst,  abscess, perirectal abscess, Fournier's gangrene  Patient's presentation is most consistent with acute presentation with potential threat to life or bodily function.   Patient's diagnosis is consistent with abscess of the buttock.  Patient presents emergency department with complaint of possible abscess to the right buttock.  Findings were consistent with abscess on exam.  Incision and drainage performed at bedside.  Patiently placed on clindamycin.  Limited pain medication will be prescribed as well for the patient.  Follow-up primary care as needed.  Return precautions discussed with the patient.. Patient is given ED precautions to return to the ED for any worsening or new symptoms.     FINAL CLINICAL IMPRESSION(S) / ED DIAGNOSES   Final diagnoses:  Abscess of buttock, right     Rx / DC Orders   ED Discharge Orders     None        Note:  This document was prepared using Dragon voice recognition software and may include unintentional dictation errors.   Lanette Hampshire 06/07/22 1856    Chesley Noon, MD 06/07/22 [id number]

## 2022-06-07 NOTE — ED Triage Notes (Signed)
Pt comes with c/o abscess to buttocks. Pt states this started 3-4 days ago.

## 2022-06-10 ENCOUNTER — Telehealth: Payer: Self-pay

## 2022-06-10 ENCOUNTER — Telehealth: Payer: Self-pay | Admitting: Pharmacist

## 2022-06-10 ENCOUNTER — Telehealth: Payer: Medicaid Other

## 2022-06-10 NOTE — Telephone Encounter (Signed)
   Outreach Note  06/10/2022 Name: Martin Mullen MRN: [id number] DOB: 1980-05-02  Referred by: [name], NP Reason for referral : No chief complaint on file.   Was unable to reach patient via telephone today and have left HIPAA compliant voicemail asking patient to return my call.    Follow Up Plan: Will collaborate with Care Guide to outreach to schedule follow up with me  Estelle Grumbles, PharmD, [facility] Clinical Pharmacist [facility] [phone]

## 2022-06-10 NOTE — Transitions of Care (Post Inpatient/ED Visit) (Signed)
   06/10/2022  Name: SYAIRE SABER MRN: [id number] DOB: [birth year]  Today's TOC FU Call Status: Today's TOC FU Call Status:: Successful TOC FU Call Competed TOC FU Call Complete Date: 06/10/22  Transition Care Management Follow-up Telephone Call Date of Discharge: 06/09/22 Discharge Facility: [facility]) Type of Discharge: Emergency Department Reason for ED Visit:  (abcesses) How have you been since you were released from the hospital?: Better Any questions or concerns?: No  Items Reviewed: Did you receive and understand the discharge instructions provided?: Yes Medications obtained and verified?: Yes (Medications Reviewed) Dietary orders reviewed?: No Do you have support at home?: Yes  Home Care and Equipment/Supplies: Were Home Health Services Ordered?: No Any new equipment or medical supplies ordered?: No  Functional Questionnaire: Do you need assistance with bathing/showering or dressing?: No Do you need assistance with meal preparation?: No Do you need assistance with eating?: No Do you have difficulty maintaining continence: No Do you need assistance with getting out of bed/getting out of a chair/moving?: No Do you have difficulty managing or taking your medications?: No  Follow up appointments reviewed: PCP Follow-up appointment confirmed?: No (Pt denied follow up by [facility] MM) MD Provider Line Number:[phone] Given: No Specialist Hospital Follow-up appointment confirmed?: No Do you need transportation to your follow-up appointment?: No Do you understand care options if your condition(s) worsen?: Yes-patient verbalized understanding   SDOH Interventions    Flowsheet Row Office Visit from 05/10/2022 in [facility] Patient Outreach Telephone from 02/16/2022 in [place] [facility] Office Visit from 12/07/2020 in [facility] Office Visit from 10/07/2020 in [facility] Office Visit from 09/02/2020 in [facility]  SDOH Interventions       Transportation Interventions -- Payor Benefit  [patient prfovided with [facility] Managed Medicaid transportation information] -- -- --  Depression Interventions/Treatment  Currently on Treatment -- Currently on Treatment Currently on Treatment Currently on Treatment       Gus Puma, BSW, [facility]  Managed [facility] Social Worker [phone]

## 2022-06-13 ENCOUNTER — Telehealth: Payer: Self-pay

## 2022-06-13 NOTE — Progress Notes (Signed)
  Care Coordination Note  06/13/2022 Name: Martin Mullen MRN: [id number] DOB: 12-Feb-1981  Martin Mullen is a 42 y.o. year old male who is a primary care patient of [name], NP and is actively engaged with the Chronic Care Management team. I reached out to Martin Mullen by phone today to assist with re-scheduling a follow up visit with the Pharmacist  Follow up plan: Unsuccessful telephone outreach attempt made. A HIPAA compliant phone message was left for the patient providing contact information and requesting a return call.  The care management team will reach out to the patient again over the next 7 days.  If patient returns call to provider office, please advise to call CCM Care Guide Martin Mullen  at [phone]  Martin Mullen, RMA Care Guide [facility]  [place], [place] [address] Direct Dial: [phone] [email or web address]

## 2022-06-26 ENCOUNTER — Other Ambulatory Visit: Payer: Self-pay

## 2022-06-26 ENCOUNTER — Emergency Department: Payer: Medicaid Other

## 2022-06-26 ENCOUNTER — Inpatient Hospital Stay
Admission: EM | Admit: 2022-06-26 | Discharge: 2022-07-06 | DRG: 854 | Disposition: A | Discharge status: Home Health | Payer: Medicaid Other | Attending: Internal Medicine | Admitting: Internal Medicine

## 2022-06-26 ENCOUNTER — Encounter: Payer: Self-pay | Admitting: Emergency Medicine

## 2022-06-26 DIAGNOSIS — L84 Corns and callosities: Secondary | ICD-10-CM | POA: Diagnosis present

## 2022-06-26 DIAGNOSIS — E1142 Type 2 diabetes mellitus with diabetic polyneuropathy: Secondary | ICD-10-CM | POA: Diagnosis present

## 2022-06-26 DIAGNOSIS — M86171 Other acute osteomyelitis, right ankle and foot: Secondary | ICD-10-CM | POA: Diagnosis present

## 2022-06-26 DIAGNOSIS — M869 Osteomyelitis, unspecified: Secondary | ICD-10-CM

## 2022-06-26 DIAGNOSIS — R7881 Bacteremia: Secondary | ICD-10-CM | POA: Diagnosis not present

## 2022-06-26 DIAGNOSIS — Z7982 Long term (current) use of aspirin: Secondary | ICD-10-CM

## 2022-06-26 DIAGNOSIS — E1169 Type 2 diabetes mellitus with other specified complication: Secondary | ICD-10-CM | POA: Diagnosis present

## 2022-06-26 DIAGNOSIS — M8618 Other acute osteomyelitis, other site: Secondary | ICD-10-CM | POA: Diagnosis not present

## 2022-06-26 DIAGNOSIS — Z83438 Family history of other disorder of lipoprotein metabolism and other lipidemia: Secondary | ICD-10-CM

## 2022-06-26 DIAGNOSIS — E11621 Type 2 diabetes mellitus with foot ulcer: Secondary | ICD-10-CM | POA: Diagnosis present

## 2022-06-26 DIAGNOSIS — E663 Overweight: Secondary | ICD-10-CM | POA: Diagnosis present

## 2022-06-26 DIAGNOSIS — L97419 Non-pressure chronic ulcer of right heel and midfoot with unspecified severity: Secondary | ICD-10-CM | POA: Diagnosis not present

## 2022-06-26 DIAGNOSIS — E11628 Type 2 diabetes mellitus with other skin complications: Secondary | ICD-10-CM | POA: Diagnosis present

## 2022-06-26 DIAGNOSIS — I10 Essential (primary) hypertension: Secondary | ICD-10-CM | POA: Diagnosis present

## 2022-06-26 DIAGNOSIS — I70235 Atherosclerosis of native arteries of right leg with ulceration of other part of foot: Secondary | ICD-10-CM | POA: Diagnosis not present

## 2022-06-26 DIAGNOSIS — E7800 Pure hypercholesterolemia, unspecified: Secondary | ICD-10-CM | POA: Diagnosis present

## 2022-06-26 DIAGNOSIS — Z8261 Family history of arthritis: Secondary | ICD-10-CM

## 2022-06-26 DIAGNOSIS — Z87438 Personal history of other diseases of male genital organs: Secondary | ICD-10-CM | POA: Insufficient documentation

## 2022-06-26 DIAGNOSIS — Z8673 Personal history of transient ischemic attack (TIA), and cerebral infarction without residual deficits: Secondary | ICD-10-CM

## 2022-06-26 DIAGNOSIS — E871 Hypo-osmolality and hyponatremia: Secondary | ICD-10-CM | POA: Diagnosis present

## 2022-06-26 DIAGNOSIS — L0390 Cellulitis, unspecified: Secondary | ICD-10-CM

## 2022-06-26 DIAGNOSIS — Z7984 Long term (current) use of oral hypoglycemic drugs: Secondary | ICD-10-CM | POA: Diagnosis not present

## 2022-06-26 DIAGNOSIS — T148XXA Other injury of unspecified body region, initial encounter: Secondary | ICD-10-CM

## 2022-06-26 DIAGNOSIS — F17210 Nicotine dependence, cigarettes, uncomplicated: Secondary | ICD-10-CM | POA: Diagnosis present

## 2022-06-26 DIAGNOSIS — Z820 Family history of epilepsy and other diseases of the nervous system: Secondary | ICD-10-CM

## 2022-06-26 DIAGNOSIS — E872 Acidosis, unspecified: Secondary | ICD-10-CM | POA: Diagnosis present

## 2022-06-26 DIAGNOSIS — I252 Old myocardial infarction: Secondary | ICD-10-CM | POA: Diagnosis not present

## 2022-06-26 DIAGNOSIS — E1165 Type 2 diabetes mellitus with hyperglycemia: Secondary | ICD-10-CM | POA: Diagnosis present

## 2022-06-26 DIAGNOSIS — A419 Sepsis, unspecified organism: Secondary | ICD-10-CM | POA: Diagnosis not present

## 2022-06-26 DIAGNOSIS — A4102 Sepsis due to Methicillin resistant Staphylococcus aureus: Secondary | ICD-10-CM | POA: Diagnosis present

## 2022-06-26 DIAGNOSIS — Z6829 Body mass index (BMI) 29.0-29.9, adult: Secondary | ICD-10-CM

## 2022-06-26 DIAGNOSIS — L02611 Cutaneous abscess of right foot: Secondary | ICD-10-CM | POA: Diagnosis present

## 2022-06-26 DIAGNOSIS — Z7901 Long term (current) use of anticoagulants: Secondary | ICD-10-CM

## 2022-06-26 DIAGNOSIS — M009 Pyogenic arthritis, unspecified: Secondary | ICD-10-CM | POA: Diagnosis present

## 2022-06-26 DIAGNOSIS — M79604 Pain in right leg: Secondary | ICD-10-CM | POA: Diagnosis not present

## 2022-06-26 DIAGNOSIS — Z9861 Coronary angioplasty status: Secondary | ICD-10-CM | POA: Diagnosis not present

## 2022-06-26 DIAGNOSIS — B9562 Methicillin resistant Staphylococcus aureus infection as the cause of diseases classified elsewhere: Secondary | ICD-10-CM | POA: Diagnosis not present

## 2022-06-26 DIAGNOSIS — M8608 Acute hematogenous osteomyelitis, other sites: Secondary | ICD-10-CM | POA: Diagnosis not present

## 2022-06-26 DIAGNOSIS — I639 Cerebral infarction, unspecified: Secondary | ICD-10-CM | POA: Diagnosis not present

## 2022-06-26 DIAGNOSIS — Z8042 Family history of malignant neoplasm of prostate: Secondary | ICD-10-CM

## 2022-06-26 DIAGNOSIS — D6852 Prothrombin gene mutation: Secondary | ICD-10-CM | POA: Diagnosis present

## 2022-06-26 DIAGNOSIS — I519 Heart disease, unspecified: Secondary | ICD-10-CM | POA: Diagnosis not present

## 2022-06-26 DIAGNOSIS — L97519 Non-pressure chronic ulcer of other part of right foot with unspecified severity: Secondary | ICD-10-CM | POA: Diagnosis present

## 2022-06-26 DIAGNOSIS — Z955 Presence of coronary angioplasty implant and graft: Secondary | ICD-10-CM

## 2022-06-26 DIAGNOSIS — L089 Local infection of the skin and subcutaneous tissue, unspecified: Secondary | ICD-10-CM

## 2022-06-26 DIAGNOSIS — Z833 Family history of diabetes mellitus: Secondary | ICD-10-CM

## 2022-06-26 DIAGNOSIS — Z794 Long term (current) use of insulin: Secondary | ICD-10-CM | POA: Diagnosis not present

## 2022-06-26 DIAGNOSIS — E785 Hyperlipidemia, unspecified: Secondary | ICD-10-CM | POA: Diagnosis present

## 2022-06-26 DIAGNOSIS — L97512 Non-pressure chronic ulcer of other part of right foot with fat layer exposed: Secondary | ICD-10-CM | POA: Insufficient documentation

## 2022-06-26 DIAGNOSIS — I2510 Atherosclerotic heart disease of native coronary artery without angina pectoris: Secondary | ICD-10-CM | POA: Diagnosis present

## 2022-06-26 DIAGNOSIS — M8610 Other acute osteomyelitis, unspecified site: Secondary | ICD-10-CM | POA: Diagnosis not present

## 2022-06-26 DIAGNOSIS — L03115 Cellulitis of right lower limb: Secondary | ICD-10-CM | POA: Diagnosis present

## 2022-06-26 DIAGNOSIS — M86071 Acute hematogenous osteomyelitis, right ankle and foot: Secondary | ICD-10-CM | POA: Diagnosis not present

## 2022-06-26 DIAGNOSIS — Z7985 Long-term (current) use of injectable non-insulin antidiabetic drugs: Secondary | ICD-10-CM

## 2022-06-26 DIAGNOSIS — L97509 Non-pressure chronic ulcer of other part of unspecified foot with unspecified severity: Secondary | ICD-10-CM

## 2022-06-26 DIAGNOSIS — E876 Hypokalemia: Secondary | ICD-10-CM | POA: Diagnosis present

## 2022-06-26 DIAGNOSIS — Z79899 Other long term (current) drug therapy: Secondary | ICD-10-CM

## 2022-06-26 DIAGNOSIS — E1151 Type 2 diabetes mellitus with diabetic peripheral angiopathy without gangrene: Secondary | ICD-10-CM | POA: Diagnosis present

## 2022-06-26 DIAGNOSIS — I739 Peripheral vascular disease, unspecified: Secondary | ICD-10-CM | POA: Diagnosis not present

## 2022-06-26 DIAGNOSIS — I340 Nonrheumatic mitral (valve) insufficiency: Secondary | ICD-10-CM | POA: Diagnosis not present

## 2022-06-26 DIAGNOSIS — L97522 Non-pressure chronic ulcer of other part of left foot with fat layer exposed: Secondary | ICD-10-CM

## 2022-06-26 LAB — COMPREHENSIVE METABOLIC PANEL
ALT: 8 U/L (ref 0–44)
AST: 13 U/L — ABNORMAL LOW (ref 15–41)
Albumin: 3.4 g/dL — ABNORMAL LOW (ref 3.5–5.0)
Alkaline Phosphatase: 125 U/L (ref 38–126)
Anion gap: 11 (ref 5–15)
BUN: 10 mg/dL (ref 6–20)
CO2: 27 mmol/L (ref 22–32)
Calcium: 8.8 mg/dL — ABNORMAL LOW (ref 8.9–10.3)
Chloride: 95 mmol/L — ABNORMAL LOW (ref 98–111)
Creatinine, Ser: 0.94 mg/dL (ref 0.61–1.24)
GFR, Estimated: >60 mL/min (ref >60)
Glucose, Bld: 293 mg/dL — ABNORMAL HIGH (ref 70–99)
Potassium: 3.4 mmol/L — ABNORMAL LOW (ref 3.5–5.1)
Sodium: 133 mmol/L — ABNORMAL LOW (ref 135–145)
Total Bilirubin: 1 mg/dL (ref 0.3–1.2)
Total Protein: 7.8 g/dL (ref 6.5–8.1)

## 2022-06-26 LAB — CBC WITH DIFFERENTIAL/PLATELET
Abs Immature Granulocytes: 0.15 10*3/uL — ABNORMAL HIGH (ref 0.00–0.07)
Basophils Absolute: 0.1 10*3/uL (ref 0.0–0.1)
Basophils Relative: 1 %
Eosinophils Absolute: 0.1 10*3/uL (ref 0.0–0.5)
Eosinophils Relative: 0 %
HCT: 45.5 % (ref 39.0–52.0)
Hemoglobin: 15.6 g/dL (ref 13.0–17.0)
Immature Granulocytes: 1 %
Lymphocytes Relative: 8 %
Lymphs Abs: 1.9 10*3/uL (ref 0.7–4.0)
MCH: 30.8 pg (ref 26.0–34.0)
MCHC: 34.3 g/dL (ref 30.0–36.0)
MCV: 89.9 fL (ref 80.0–100.0)
Monocytes Absolute: 1.3 10*3/uL — ABNORMAL HIGH (ref 0.1–1.0)
Monocytes Relative: 5 %
Neutro Abs: 20.2 10*3/uL — ABNORMAL HIGH (ref 1.7–7.7)
Neutrophils Relative %: 85 %
Platelets: 321 10*3/uL (ref 150–400)
RBC: 5.06 MIL/uL (ref 4.22–5.81)
RDW: 12.1 % (ref 11.5–15.5)
WBC: 23.7 10*3/uL — ABNORMAL HIGH (ref 4.0–10.5)
nRBC: 0 % (ref 0.0–0.2)

## 2022-06-26 LAB — CBG MONITORING, ED: Glucose-Capillary: 267 mg/dL — ABNORMAL HIGH (ref 70–99)

## 2022-06-26 LAB — LACTIC ACID, PLASMA: Lactic Acid, Venous: 2.8 mmol/L (ref 0.5–1.9)

## 2022-06-26 MED ORDER — INSULIN ASPART 100 UNIT/ML IJ SOLN
0.0000 [IU] | Freq: Every day | INTRAMUSCULAR | Status: DC
  Administered 2022-06-26: 3 [IU] via SUBCUTANEOUS
  Administered 2022-06-29: 2 [IU] via SUBCUTANEOUS
  Administered 2022-06-30 – 2022-07-01 (×2): 4 [IU] via SUBCUTANEOUS
  Administered 2022-07-02: 3 [IU] via SUBCUTANEOUS
  Administered 2022-07-04 – 2022-07-05 (×2): 2 [IU] via SUBCUTANEOUS
  Filled 2022-06-26 (×7): qty 1

## 2022-06-26 MED ORDER — SODIUM CHLORIDE 0.9 % IV SOLN: 2.0000 g | Freq: Three times a day (TID) | INTRAVENOUS | Status: DC

## 2022-06-26 MED ORDER — SODIUM CHLORIDE 0.9 % IV SOLN
2.0000 g | Freq: Three times a day (TID) | INTRAVENOUS | Status: DC
  Administered 2022-06-27 – 2022-06-30 (×9): 2 g via INTRAVENOUS
  Filled 2022-06-26 (×11): qty 12.5

## 2022-06-26 MED ORDER — VANCOMYCIN HCL 1500 MG/300ML IV SOLN: 1500.0000 mg | Freq: Two times a day (BID) | INTRAVENOUS | Status: DC

## 2022-06-26 MED ORDER — VANCOMYCIN HCL 1500 MG/300ML IV SOLN
1500.0000 mg | Freq: Two times a day (BID) | INTRAVENOUS | Status: DC
  Administered 2022-06-27 – 2022-06-30 (×6): 1500 mg via INTRAVENOUS
  Filled 2022-06-26 (×8): qty 300

## 2022-06-26 MED ORDER — VANCOMYCIN HCL 1250 MG/250ML IV SOLN
1250.0000 mg | Freq: Once | INTRAVENOUS | Status: AC
  Administered 2022-06-27: 1250 mg via INTRAVENOUS
  Filled 2022-06-26 (×2): qty 250

## 2022-06-26 MED ORDER — SODIUM CHLORIDE 0.9 % IV SOLN
2.0000 g | Freq: Once | INTRAVENOUS | Status: AC
  Administered 2022-06-26: 2 g via INTRAVENOUS
  Filled 2022-06-26: qty 12.5

## 2022-06-26 MED ORDER — LACTATED RINGERS IV SOLN
150.0000 mL/h | INTRAVENOUS | Status: DC
  Administered 2022-06-26: 150 mL/h via INTRAVENOUS

## 2022-06-26 MED ORDER — VANCOMYCIN HCL IN DEXTROSE 1-5 GM/200ML-% IV SOLN
1000.0000 mg | Freq: Once | INTRAVENOUS | Status: AC
  Administered 2022-06-26: 1000 mg via INTRAVENOUS
  Filled 2022-06-26: qty 200

## 2022-06-26 MED ORDER — ONDANSETRON HCL 4 MG PO TABS: 4.0000 mg | ORAL_TABLET | Freq: Four times a day (QID) | ORAL | Status: DC | PRN

## 2022-06-26 MED ORDER — INSULIN GLARGINE-YFGN 100 UNIT/ML ~~LOC~~ SOLN
16.0000 [IU] | Freq: Every day | SUBCUTANEOUS | Status: DC
  Administered 2022-06-27 – 2022-07-05 (×9): 16 [IU] via SUBCUTANEOUS
  Filled 2022-06-26 (×10): qty 0.16

## 2022-06-26 MED ORDER — SODIUM CHLORIDE 0.9 % IV BOLUS
1000.0000 mL | Freq: Once | INTRAVENOUS | Status: AC
  Administered 2022-06-26: 1000 mL via INTRAVENOUS

## 2022-06-26 MED ORDER — LOSARTAN POTASSIUM 25 MG PO TABS
25.0000 mg | ORAL_TABLET | Freq: Every day | ORAL | Status: DC
  Administered 2022-06-26 – 2022-07-06 (×10): 25 mg via ORAL
  Filled 2022-06-26 (×10): qty 1

## 2022-06-26 MED ORDER — ACETAMINOPHEN 325 MG PO TABS
650.0000 mg | ORAL_TABLET | Freq: Four times a day (QID) | ORAL | Status: DC | PRN
  Administered 2022-06-26: 650 mg via ORAL
  Filled 2022-06-26: qty 2

## 2022-06-26 MED ORDER — APIXABAN 5 MG PO TABS
5.0000 mg | ORAL_TABLET | Freq: Two times a day (BID) | ORAL | Status: DC
  Administered 2022-06-27 (×2): 5 mg via ORAL
  Filled 2022-06-26 (×2): qty 1

## 2022-06-26 MED ORDER — INSULIN ASPART 100 UNIT/ML IJ SOLN
0.0000 [IU] | Freq: Three times a day (TID) | INTRAMUSCULAR | Status: DC
  Administered 2022-06-27 – 2022-06-30 (×6): 3 [IU] via SUBCUTANEOUS
  Administered 2022-06-30: 8 [IU] via SUBCUTANEOUS
  Administered 2022-07-01: 3 [IU] via SUBCUTANEOUS
  Administered 2022-07-01: 5 [IU] via SUBCUTANEOUS
  Administered 2022-07-01: 8 [IU] via SUBCUTANEOUS
  Administered 2022-07-02: 5 [IU] via SUBCUTANEOUS
  Administered 2022-07-03: 3 [IU] via SUBCUTANEOUS
  Administered 2022-07-03: 5 [IU] via SUBCUTANEOUS
  Administered 2022-07-03: 15 [IU] via SUBCUTANEOUS
  Administered 2022-07-04 – 2022-07-05 (×6): 3 [IU] via SUBCUTANEOUS
  Administered 2022-07-06: 8 [IU] via SUBCUTANEOUS
  Administered 2022-07-06: 3 [IU] via SUBCUTANEOUS
  Filled 2022-06-26 (×22): qty 1

## 2022-06-26 MED ORDER — ROSUVASTATIN CALCIUM 10 MG PO TABS
20.0000 mg | ORAL_TABLET | Freq: Every day | ORAL | Status: DC
  Administered 2022-06-26 – 2022-07-06 (×10): 20 mg via ORAL
  Filled 2022-06-26 (×4): qty 2
  Filled 2022-06-26: qty 1
  Filled 2022-06-26 (×5): qty 2
  Filled 2022-06-26: qty 1

## 2022-06-26 MED ORDER — ASPIRIN 81 MG PO TBEC
81.0000 mg | DELAYED_RELEASE_TABLET | Freq: Every day | ORAL | Status: DC
  Administered 2022-06-26 – 2022-07-06 (×11): 81 mg via ORAL
  Filled 2022-06-26 (×11): qty 1

## 2022-06-26 MED ORDER — BUPROPION HCL ER (XL) 150 MG PO TB24
150.0000 mg | ORAL_TABLET | Freq: Every day | ORAL | Status: DC
  Administered 2022-06-26 – 2022-07-06 (×11): 150 mg via ORAL
  Filled 2022-06-26 (×11): qty 1

## 2022-06-26 MED ORDER — ACETAMINOPHEN 650 MG RE SUPP: 650.0000 mg | Freq: Four times a day (QID) | RECTAL | Status: DC | PRN

## 2022-06-26 MED ORDER — HYDROCODONE-ACETAMINOPHEN 5-325 MG PO TABS
1.0000 | ORAL_TABLET | ORAL | Status: DC | PRN
  Administered 2022-06-27 – 2022-06-28 (×2): 1 via ORAL
  Filled 2022-06-26 (×2): qty 1

## 2022-06-26 MED ORDER — ONDANSETRON HCL 4 MG/2ML IJ SOLN
4.0000 mg | Freq: Four times a day (QID) | INTRAMUSCULAR | Status: DC | PRN
  Administered 2022-06-30: 4 mg via INTRAVENOUS
  Filled 2022-06-26: qty 2

## 2022-06-26 NOTE — ED Notes (Signed)
Received patient from Selena Batten, Charity fundraiser. Patient awake and alert, ambulatory to bed, cooperative at this time. Patient provided with snack and drink at request. NAD. Prefers not to change into hospital gown. Oriented to room and area, denies further needs.

## 2022-06-26 NOTE — H&P (Signed)
History and Physical    Patient: Martin Mullen ZOX:[id number] DOB: Aug 08, 1980 DOA: 06/26/2022 DOS: the patient was seen and examined on 06/26/2022 PCP: [name], NP  Patient coming from: Home  Chief Complaint:  Chief Complaint  Patient presents with   Leg Pain    HPI: Martin Mullen is a 42 y.o. 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  He denies any injury.  He was previously in his usual state of health.  He denies chest pain or shortness of breath.  Denies fevers or chills. ED course and Data review: Tachycardic in the ED with otherwise normal vitals.  Labs notable for WBC of 23,000 with lactic acid 2.8.  Glucose 293, sodium 133 and potassium 3.4.  X-ray right foot showing soft tissue ulcer near second MTP joint without osteomyelitis. Lower extremity Doppler negative for DVT.  Does show several nonspecific prominent lymph nodes in the right groin Patient started on sepsis fluids as well as vancomycin and cefepime. Hospitalist consulted for admission.   Review of Systems: As mentioned in the history of present illness. All other systems reviewed and are negative.  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   Chicken pox    Coronary artery disease    Inferior ST elevation myocardial infarction in July of 2014. Cardiac catheterization showed 95% distal RCA stenosis and 90% first diagonal stenosis with normal ejection fraction. He underwent PCI in bare-metal stent placement to the distal RCA.   Diabetes mellitus without complication  (HCC)    Hypercholesterolemia    Stroke (HCC)    Tobacco use    Past Surgical History:  Procedure Laterality Date   CORONARY ANGIOPLASTY WITH STENT PLACEMENT     DEBRIDEMENT AND CLOSURE WOUND N/A 09/03/2019   Procedure: Debridement and closure of scrotal wound;  Surgeon: Allena Napoleon, MD;  Location: [facility];  Service: Plastics;  Laterality: N/A;   DEBRIDEMENT AND CLOSURE WOUND N/A 07/25/2019   Procedure: DEBRIDEMENT AND CLOSURE WOUND;  Surgeon: Allena Napoleon, MD;  Location: [facility];  Service: Plastics;  Laterality: N/A;   INCISION AND DRAINAGE ABSCESS N/A 07/20/2019   Procedure: INCISION AND DRAINAGE ABSCESS;  Surgeon: Crista Elliot, MD;  Location: [facility];  Service: Urology;  Laterality: N/A;   LEFT HEART CATHETERIZATION WITH CORONARY ANGIOGRAM N/A 08/28/2012   Procedure: LEFT HEART CATHETERIZATION WITH CORONARY ANGIOGRAM;  Surgeon: Pamella Pert, MD;  Location: [facility] CATH LAB;  Service: Cardiovascular;  Laterality: N/A;   TEE WITHOUT CARDIOVERSION N/A 09/23/2020   Procedure: TRANSESOPHAGEAL ECHOCARDIOGRAM (TEE);  Surgeon: Lamar Blinks, MD;  Location: [facility] ORS;  Service: Cardiovascular;  Laterality: N/A;   WRIST SURGERY  1988   Social History:  reports that he has been smoking cigarettes. He has a 30.00 pack-year smoking history. He has never used smokeless tobacco. He reports that he does not currently use alcohol. He reports that he does not currently use drugs after having used the following drugs: Marijuana.  No Known Allergies  Family History  Problem Relation Age of Onset   Arthritis Mother  Rheumatoid   Diabetes Mother    Hyperlipidemia Mother    Diabetes Father    Cancer Maternal Grandfather        Prostate   Parkinson's disease Maternal Grandfather     Prior to Admission medications   Medication Sig Start Date End Date Taking? Authorizing Provider  Accu-Chek Softclix Lancets lancets Use to check blood sugar three times a day for type  2 diabetes E11.9 03/22/22   [name], NP  acetaminophen (TYLENOL) 500 MG tablet Take 2 tablets (1,000 mg total) by mouth every 6 (six) hours as needed. 01/25/22 01/25/23  Varney Daily, PA  apixaban (ELIQUIS) 5 MG TABS tablet Take 1 tablet (5 mg total) by mouth 2 (two) times daily. 02/10/22   [name], NP  aspirin EC 81 MG tablet Take 1 tablet (81 mg total) by mouth daily. 02/10/22   [name], NP  buPROPion (WELLBUTRIN XL) 150 MG 24 hr tablet Take 1 tablet (150 mg total) by mouth daily. 02/10/22   [name], NP  glucose blood (ACCU-CHEK GUIDE) test strip Use to check blood sugar three times a day for type 2 diabetes E11.9 03/22/22   [name], NP  HYDROcodone-acetaminophen (NORCO/VICODIN) 5-325 MG tablet Take 1 tablet by mouth every 4 (four) hours as needed for moderate pain. 06/07/22 06/07/23  Cuthriell, Delorise Royals, PA-C  insulin glargine (LANTUS SOLOSTAR) 100 UNIT/ML Solostar Pen Inject 16 Units into the skin daily. 05/10/22 08/08/22  [name], NP  losartan (COZAAR) 25 MG tablet Take 1 tablet (25 mg total) by mouth daily. 05/10/22   [name], NP  metFORMIN (GLUCOPHAGE) 1000 MG tablet Take 1 tablet (1,000 mg total) by mouth 2 (two) times daily with a meal. TAKE 1 TABLET(1000 MG) BY MOUTH TWICE DAILY WITH A MEAL 02/10/22   [name], NP  Multiple Vitamin (MULTIVITAMIN WITH MINERALS) TABS tablet Take 1 tablet by mouth daily. One-A-Day Multivitamin 09/28/21   [name], Leana Roe, MD  OZEMPIC, 0.25 OR 0.5 MG/DOSE, 2 MG/3ML SOPN Inject 0.5 mg into the skin once a week. Start with 0.25mg  weekly x 4 weeks then increase to 0.5mg  weekly injection. 04/07/22   [name], NP  rosuvastatin (CRESTOR) 20 MG tablet Take 1 tablet (20 mg total) by mouth daily. 02/10/22   [name], NP  sildenafil (VIAGRA) 50 MG tablet Take 1 tablet (50 mg total) by mouth daily as needed for erectile dysfunction. 04/11/22   [name], NP    Physical Exam: Vitals:    06/26/22 1837 06/26/22 1838 06/26/22 1839  BP:   127/78  Pulse: (!) 111    Resp: 20    Temp: 98.2 F (36.8 C)    TempSrc: Oral    SpO2: 99%    Weight:  92.5 kg    Physical Exam Vitals and nursing note reviewed.  Constitutional:      General: He is not in acute distress. HENT:     Head: Normocephalic and atraumatic.  Cardiovascular:     Rate and Rhythm: Regular rhythm. Tachycardia present.     Heart sounds: Normal heart sounds.  Pulmonary:     Effort: Pulmonary effort is normal.     Breath sounds: Normal breath sounds.  Abdominal:     Palpations: Abdomen is soft.     Tenderness: There is no abdominal tenderness.  Musculoskeletal:     Comments: See pic below  Skin:    Comments: See pic below  Neurological:  Mental Status: Mental status is at baseline.       Labs on Admission: I have personally reviewed following labs and imaging studies  CBC: Recent Labs  Lab 06/26/22 1956  WBC 23.7*  NEUTROABS 20.2*  HGB 15.6  HCT 45.5  MCV 89.9  PLT 321   Basic Metabolic Panel: Recent Labs  Lab 06/26/22 1956  NA 133*  K 3.4*  CL 95*  CO2 27  GLUCOSE 293*  BUN 10  CREATININE 0.94  CALCIUM 8.8*   GFR: Estimated Creatinine Clearance: 117 mL/min (by C-G formula based on SCr of 0.94 mg/dL). Liver Function Tests: Recent Labs  Lab 06/26/22 1956  AST 13*  ALT 8  ALKPHOS 125  BILITOT 1.0  PROT 7.8  ALBUMIN 3.4*   No results for input(s): "LIPASE", "AMYLASE" in the last 168 hours. No results for input(s): "AMMONIA" in the last 168 hours. Coagulation Profile: No results for input(s): "INR", "PROTIME" in the last 168 hours. Cardiac Enzymes: No results for input(s): "CKTOTAL", "CKMB", "CKMBINDEX", "TROPONINI" in the last 168 hours. BNP (last 3 results) No results for input(s): "PROBNP" in the last 8760 hours. HbA1C: No results for input(s): "HGBA1C" in the last 72 hours. CBG: No results for input(s): "GLUCAP" in the last 168 hours. Lipid Profile: No  results for input(s): "CHOL", "HDL", "LDLCALC", "TRIG", "CHOLHDL", "LDLDIRECT" in the last 72 hours. Thyroid Function Tests: No results for input(s): "TSH", "T4TOTAL", "FREET4", "T3FREE", "THYROIDAB" in the last 72 hours. Anemia Panel: No results for input(s): "VITAMINB12", "FOLATE", "FERRITIN", "TIBC", "IRON", "RETICCTPCT" in the last 72 hours. Urine analysis:    Component Value Date/Time   COLORURINE YELLOW (A) 11/16/2020 1810   APPEARANCEUR HAZY (A) 11/16/2020 1810   LABSPEC 1.020 11/16/2020 1810   PHURINE 5.0 11/16/2020 1810   GLUCOSEU >=500 (A) 11/16/2020 1810   HGBUR NEGATIVE 11/16/2020 1810   BILIRUBINUR NEGATIVE 11/16/2020 1810   KETONESUR NEGATIVE 11/16/2020 1810   PROTEINUR 100 (A) 11/16/2020 1810   NITRITE NEGATIVE 11/16/2020 1810   LEUKOCYTESUR NEGATIVE 11/16/2020 1810    Radiological Exams on Admission: DG Foot Complete Right  Result Date: 06/26/2022 CLINICAL DATA:  Second toe ulcer, initial encounter EXAM: RIGHT FOOT COMPLETE - 3+ VIEW COMPARISON:  None Available. FINDINGS: Soft tissue ulcer is noted near the second MTP joint. No erosive changes are identified to suggest osteomyelitis. Tiny densities are noted adjacent to the soft tissue wound. These may represent small foreign bodies. IMPRESSION: Soft tissue wound without bony erosive change. Question small foreign bodies within the wound. Electronically Signed   By: Alcide Clever M.D.   On: 06/26/2022 20:13   US Venous Img Lower Unilateral Right  Result Date: 06/26/2022 CLINICAL DATA:  Right lower extremity pain EXAM: RIGHT LOWER EXTREMITY VENOUS DOPPLER ULTRASOUND TECHNIQUE: Gray-scale sonography with compression, as well as color and duplex ultrasound, were performed to evaluate the deep venous system(s) from the level of the common femoral vein through the popliteal and proximal calf veins. COMPARISON:  None Available. FINDINGS: VENOUS Normal compressibility of the common femoral, superficial femoral, and popliteal  veins, as well as the visualized calf veins. Visualized portions of profunda femoral vein and great saphenous vein unremarkable. No filling defects to suggest DVT on grayscale or color Doppler imaging. Doppler waveforms show normal direction of venous flow, normal respiratory plasticity and response to augmentation. Limited views of the contralateral common femoral vein are unremarkable. OTHER Several prominent lymph nodes in the right groin. Limitations: none IMPRESSION: 1.  Negative for DVT in the right lower  extremity. 2.  Several nonspecific prominent lymph nodes in the right groin. Electronically Signed   By: Emmaline Kluver M.D.   On: 06/26/2022 19:53     Data Reviewed: Relevant notes from primary care and specialist visits, past discharge summaries as available in EHR, including Care Everywhere. Prior diagnostic testing as pertinent to current admission diagnoses Updated medications and problem lists for reconciliation ED course, including vitals, labs, imaging, treatment and response to treatment Triage notes, nursing and pharmacy notes and ED provider's notes Notable results as noted in HPI   Assessment and Plan: Cellulitis of right foot Diabetic foot ulcer/plantar ulcer right foot Sepsis Sepsis criteria include tachycardia, leukocytosis and lactic acidosis with suspected infected ulcer Sepsis fluids Continue cefepime and vancomycin Diabetic control Podiatry consult and vascular consult  History of Fournier's gangrene No acute issues  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) History of recurrent strokes Chronic anticoagulation Lower extremity ultrasound negative for DVT Continue Eliquis  CAD S/P percutaneous coronary angioplasty History of STEMI in 2014 with bare-metal stent to distal RCA No complaints of chest pain Continue rosuvastatin, aspirin and losartan  Uncontrolled type 2 diabetes mellitus with hyperglycemia, with long-term current use of insulin  (HCC) Continue basal insulin Sliding scale coverage Hold home semaglutide and metformin  Hyperlipidemia associated with type 2 diabetes mellitus (HCC) Continue Crestor  HTN (hypertension) Continue losartan     DVT prophylaxis: Eliquis  Consults: Podiatry, Dr. Lilian Kapur  Advance Care Planning:   Code Status: Prior   Family Communication: none  Disposition Plan: Back to previous home environment  Severity of Illness: The appropriate patient status for this patient is INPATIENT. Inpatient status is judged to be reasonable and necessary in order to provide the required intensity of service to ensure the patient's safety. The patient's presenting symptoms, physical exam findings, and initial radiographic and laboratory data in the context of their chronic comorbidities is felt to place them at high risk for further clinical deterioration. Furthermore, it is not anticipated that the patient will be medically stable for discharge from the hospital within 2 midnights of admission.   * I certify that at the point of admission it is my clinical judgment that the patient will require inpatient hospital care spanning beyond 2 midnights from the point of admission due to high intensity of service, high risk for further deterioration and high frequency of surveillance required.*  Author: Andris Baumann, MD 06/26/2022 9:58 PM  For on call review [email or web address].

## 2022-06-26 NOTE — Assessment & Plan Note (Signed)
Infected diabetic foot ulcer right foot Sepsis Sepsis criteria include tachycardia, leukocytosis and lactic acidosis with suspected infected ulcer Sepsis fluids Continue cefepime and vancomycin Diabetic control Podiatry consult

## 2022-06-26 NOTE — Progress Notes (Signed)
Pharmacy Antibiotic Note  Martin Mullen is a 42 y.o. male admitted on 06/26/2022 with cellulitis.  Pharmacy has been consulted for Vanc, Cefepime  dosing.  Plan: Cefepime 2 gm IV X 1 given in ED on 5/12 @ 2102. Cefepime 2 gm IV Q8H ordered to start on 5/13 @ 0500.  Vancomycin 1 gm IV X 1 given in ED on 5/12 @ 2139.  Additional Vanc 1250 mg IV X 1 ordered to make total loading dose of 2250 mg.  Vancomycin 1500 mg IV Q12H ordered to start on 5/13 @ 1000.  AUC = 488.9 Vanc trough = 12.8   Weight: 92.5 kg (203 lb 14.8 oz)  Temp (24hrs), Avg:98.2 F (36.8 C), Min:98.2 F (36.8 C), Max:98.2 F (36.8 C)  Recent Labs  Lab 06/26/22 1956  WBC 23.7*  CREATININE 0.94  LATICACIDVEN 2.8*    Estimated Creatinine Clearance: 117 mL/min (by C-G formula based on SCr of 0.94 mg/dL).    No Known Allergies  Antimicrobials this admission:   >>    >>   Dose adjustments this admission:   Microbiology results:  BCx:   UCx:    Sputum:    MRSA PCR:   Thank you for allowing pharmacy to be a part of this patient's care.  Safiya Girdler D 06/26/2022 10:27 PM

## 2022-06-26 NOTE — Assessment & Plan Note (Signed)
History of STEMI in 2014 with bare-metal stent to distal RCA No complaints of chest pain Continue rosuvastatin, aspirin and losartan

## 2022-06-26 NOTE — Assessment & Plan Note (Signed)
Continue Crestor 

## 2022-06-26 NOTE — Assessment & Plan Note (Signed)
History of recurrent strokes Chronic anticoagulation Lower extremity ultrasound negative for DVT Continue Eliquis

## 2022-06-26 NOTE — Assessment & Plan Note (Signed)
Continue basal insulin Sliding scale coverage Hold home semaglutide and metformin

## 2022-06-26 NOTE — ED Provider Notes (Signed)
[facility] Provider Note    Event Date/Time   First MD Initiated Contact with Patient 06/26/22 [phone]     (approximate)   History   Leg Pain   HPI  Martin Mullen is a 42 y.o. male with history of CVA, DM, CAD, MI and as listed in EMR presents to the emergency department for treatment and evaluation of pain in right lower extremity x 2 days. No history of DVT or PE. No specific injury. He is supposed to take Eliquis daily, but has not taken it in 2 days because it "wasn't in his caddy."  .   Physical Exam   Triage Vital Signs: ED Triage Vitals  Enc Vitals Group     BP 06/26/22 1839 127/78     Pulse Rate 06/26/22 1837 (!) 111     Resp 06/26/22 1837 20     Temp 06/26/22 1837 98.2 F (36.8 C)     Temp Source 06/26/22 1837 Oral     SpO2 06/26/22 1837 99 %     Weight 06/26/22 1838 203 lb 14.8 oz (92.5 kg)     Height --      Head Circumference --      Peak Flow --      Pain Score 06/26/22 1837 6     Pain Loc --      Pain Edu? --      Excl. in GC? --     Most recent vital signs: Vitals:   06/26/22 1837 06/26/22 1839  BP:  127/78  Pulse: (!) 111   Resp: 20   Temp: 98.2 F (36.8 C)   SpO2: 99%     General: Awake, no distress.  CV:  Good peripheral perfusion. DP pulse of RLE 2+ Resp:  Normal effort.  Abd:  No distention.  Other:  Chronic appearing ulcer on plantar aspect of foot overlying area of 2nd MCP. Lymphangitis to right calf noted. Patient states his pain is in the calf.         ED Results / Procedures / Treatments   Labs (all labs ordered are listed, but only abnormal results are displayed) Labs Reviewed  COMPREHENSIVE METABOLIC PANEL - Abnormal; Notable for the following components:      Result Value   Sodium 133 (*)    Potassium 3.4 (*)    Chloride 95 (*)    Glucose, Bld 293 (*)    Calcium 8.8 (*)    Albumin 3.4 (*)    AST 13 (*)    All other components within normal limits  CBC WITH DIFFERENTIAL/PLATELET -  Abnormal; Notable for the following components:   WBC 23.7 (*)    Neutro Abs 20.2 (*)    Monocytes Absolute 1.3 (*)    Abs Immature Granulocytes 0.15 (*)    All other components within normal limits  LACTIC ACID, PLASMA - Abnormal; Notable for the following components:   Lactic Acid, Venous 2.8 (*)    All other components within normal limits  CULTURE, BLOOD (SINGLE)  CULTURE, BLOOD (SINGLE)  LACTIC ACID, PLASMA     EKG   RADIOLOGY  Image and radiology report reviewed and interpreted by me. Radiology report consistent with the same.  X-ray of the right foot negative for evidence of bony erosion or gas within the soft tissues.  Ultrasound of the right lower extremity is negative for DVT or other acute concerns.  PROCEDURES:  Critical Care performed: Yes, see critical care procedure note(s)  Procedures  MEDICATIONS ORDERED IN ED:  Medications  vancomycin (VANCOCIN) IVPB 1000 mg/200 mL premix (1,000 mg Intravenous New Bag/Given 06/26/22 2139)  ceFEPIme (MAXIPIME) 2 g in sodium chloride 0.9 % 100 mL IVPB (0 g Intravenous Stopped 06/26/22 2139)  sodium chloride 0.9 % bolus 1,000 mL (1,000 mLs Intravenous New Bag/Given 06/26/22 2101)     IMPRESSION / MDM / ASSESSMENT AND PLAN / ED COURSE   I have reviewed the triage note.  Differential diagnosis includes, but is not limited to, osteomyelitis, cellulitis, dvt,   Patient's presentation is most consistent with acute presentation with potential threat to life or bodily function.  42 year old male presenting to the emergency department for treatment and evaluation of pain in the right lower extremity.  On exam, right lower extremity has no pitting edema.  Right foot is noted to be erythematous and edematous.  He is noted to have a chronic appearing wound to the plantar aspect of his right foot near the second MTP.  Patient states that he is aware that this has been there but is unable to tell me how long.  Patient is unkempt.   He has what appears to be feces on the bottom of his right foot.   He is slightly tachycardic at 111 but has no fever or tachypnea.  Labs including lactic and blood cultures ordered as well as ultrasound for DVT rule out and x-ray image of the right foot.  CBC shows a leukocytosis of 23.7 with a neutrophil count of 20.2.  CMP without critical values but he is noted to be slightly hyponatremic, hypokalemic and hyperglycemic.  Fluids and antibiotics ordered.  Lactic is still pending.  First lactic is 2.8.  Fluids and antibiotics are already infusing.  He has already had blood cultures drawn as well.  Ultrasound is negative for DVT.  X-ray image of the right foot is negative for obvious bony erosion or soft tissue gas.  Dr. Para March has accepted the patient for admission.    CRITICAL CARE Performed by: Kem Boroughs   Total critical care time: 45 minutes  Critical care time was exclusive of separately billable procedures and treating other patients.  Critical care was necessary to treat or prevent imminent or life-threatening deterioration.  Critical care was time spent personally by me on the following activities: development of treatment plan with patient and/or surrogate as well as nursing, discussions with consultants, evaluation of patient's response to treatment, examination of patient, obtaining history from patient or surrogate, ordering and performing treatments and interventions, ordering and review of laboratory studies, ordering and review of radiographic studies, pulse oximetry and re-evaluation of patient's condition.    FINAL CLINICAL IMPRESSION(S) / ED DIAGNOSES   Final diagnoses:  Sepsis due to cellulitis [facility])     Rx / DC Orders   ED Discharge Orders     None        Note:  This document was prepared using Dragon voice recognition software and may include unintentional dictation errors.   Chinita Pester, FNP 06/26/22 2141    Georga Hacking,  MD 06/27/22 [phone]

## 2022-06-26 NOTE — Assessment & Plan Note (Signed)
No acute issues.

## 2022-06-26 NOTE — Assessment & Plan Note (Signed)
Continue losartan. 

## 2022-06-26 NOTE — ED Triage Notes (Signed)
Right lower leg pain started today. Denies any injury. Pt states pain is in calf where his veins are visible. No hx of blood clots.

## 2022-06-27 ENCOUNTER — Inpatient Hospital Stay: Payer: Medicaid Other | Admitting: General Practice

## 2022-06-27 ENCOUNTER — Inpatient Hospital Stay: Payer: Medicaid Other

## 2022-06-27 ENCOUNTER — Encounter
Admission: EM | Disposition: A | Discharge status: Home Health | Payer: Self-pay | Source: Home / Self Care | Attending: Internal Medicine

## 2022-06-27 ENCOUNTER — Encounter: Payer: Self-pay | Admitting: Radiology

## 2022-06-27 DIAGNOSIS — E11628 Type 2 diabetes mellitus with other skin complications: Secondary | ICD-10-CM

## 2022-06-27 DIAGNOSIS — M79604 Pain in right leg: Secondary | ICD-10-CM

## 2022-06-27 DIAGNOSIS — M869 Osteomyelitis, unspecified: Secondary | ICD-10-CM | POA: Diagnosis not present

## 2022-06-27 DIAGNOSIS — E1165 Type 2 diabetes mellitus with hyperglycemia: Secondary | ICD-10-CM

## 2022-06-27 DIAGNOSIS — E876 Hypokalemia: Secondary | ICD-10-CM | POA: Diagnosis not present

## 2022-06-27 DIAGNOSIS — M8610 Other acute osteomyelitis, unspecified site: Secondary | ICD-10-CM

## 2022-06-27 DIAGNOSIS — M86071 Acute hematogenous osteomyelitis, right ankle and foot: Secondary | ICD-10-CM | POA: Diagnosis not present

## 2022-06-27 DIAGNOSIS — E11621 Type 2 diabetes mellitus with foot ulcer: Secondary | ICD-10-CM

## 2022-06-27 DIAGNOSIS — Z955 Presence of coronary angioplasty implant and graft: Secondary | ICD-10-CM

## 2022-06-27 DIAGNOSIS — F17210 Nicotine dependence, cigarettes, uncomplicated: Secondary | ICD-10-CM

## 2022-06-27 DIAGNOSIS — D6852 Prothrombin gene mutation: Secondary | ICD-10-CM

## 2022-06-27 DIAGNOSIS — L97512 Non-pressure chronic ulcer of other part of right foot with fat layer exposed: Secondary | ICD-10-CM | POA: Diagnosis not present

## 2022-06-27 DIAGNOSIS — Z7901 Long term (current) use of anticoagulants: Secondary | ICD-10-CM

## 2022-06-27 DIAGNOSIS — L03115 Cellulitis of right lower limb: Secondary | ICD-10-CM

## 2022-06-27 DIAGNOSIS — Z794 Long term (current) use of insulin: Secondary | ICD-10-CM

## 2022-06-27 DIAGNOSIS — I739 Peripheral vascular disease, unspecified: Secondary | ICD-10-CM

## 2022-06-27 DIAGNOSIS — A4102 Sepsis due to Methicillin resistant Staphylococcus aureus: Principal | ICD-10-CM

## 2022-06-27 DIAGNOSIS — I639 Cerebral infarction, unspecified: Secondary | ICD-10-CM

## 2022-06-27 DIAGNOSIS — L97419 Non-pressure chronic ulcer of right heel and midfoot with unspecified severity: Secondary | ICD-10-CM

## 2022-06-27 DIAGNOSIS — L089 Local infection of the skin and subcutaneous tissue, unspecified: Secondary | ICD-10-CM

## 2022-06-27 DIAGNOSIS — I252 Old myocardial infarction: Secondary | ICD-10-CM

## 2022-06-27 HISTORY — PX: AMPUTATION TOE: SHX6595

## 2022-06-27 HISTORY — PX: INCISION AND DRAINAGE: SHX5863

## 2022-06-27 LAB — PROCALCITONIN: Procalcitonin: 0.46 ng/mL

## 2022-06-27 LAB — BLOOD CULTURE ID PANEL (REFLEXED) - BCID2

## 2022-06-27 LAB — AEROBIC/ANAEROBIC CULTURE W GRAM STAIN (SURGICAL/DEEP WOUND): Gram Stain: NONE SEEN

## 2022-06-27 LAB — LACTIC ACID, PLASMA: Lactic Acid, Venous: 1.4 mmol/L (ref 0.5–1.9)

## 2022-06-27 LAB — CBG MONITORING, ED
Glucose-Capillary: 229 mg/dL — ABNORMAL HIGH (ref 70–99)
Glucose-Capillary: 192 mg/dL — ABNORMAL HIGH (ref 70–99)
Glucose-Capillary: 221 mg/dL — ABNORMAL HIGH (ref 70–99)
Glucose-Capillary: 211 mg/dL — ABNORMAL HIGH (ref 70–99)

## 2022-06-27 LAB — CBC
HCT: 42.2 % (ref 39.0–52.0)
Hemoglobin: 14.4 g/dL (ref 13.0–17.0)
MCH: 31 pg (ref 26.0–34.0)
MCHC: 34.1 g/dL (ref 30.0–36.0)
MCV: 90.9 fL (ref 80.0–100.0)
Platelets: 263 10*3/uL (ref 150–400)
RBC: 4.64 MIL/uL (ref 4.22–5.81)
RDW: 12.2 % (ref 11.5–15.5)
WBC: 22.2 10*3/uL — ABNORMAL HIGH (ref 4.0–10.5)
nRBC: 0 % (ref 0.0–0.2)

## 2022-06-27 LAB — BASIC METABOLIC PANEL
Anion gap: 11 (ref 5–15)
BUN: 9 mg/dL (ref 6–20)
CO2: 21 mmol/L — ABNORMAL LOW (ref 22–32)
Calcium: 8 mg/dL — ABNORMAL LOW (ref 8.9–10.3)
Chloride: 99 mmol/L (ref 98–111)
Creatinine, Ser: 0.82 mg/dL (ref 0.61–1.24)
GFR, Estimated: >60 mL/min (ref >60)
Glucose, Bld: 222 mg/dL — ABNORMAL HIGH (ref 70–99)
Potassium: 3.4 mmol/L — ABNORMAL LOW (ref 3.5–5.1)
Sodium: 131 mmol/L — ABNORMAL LOW (ref 135–145)

## 2022-06-27 LAB — CORTISOL-AM, BLOOD: Cortisol - AM: 14.3 ug/dL (ref 6.7–22.6)

## 2022-06-27 LAB — PROTIME-INR
INR: 1.3 — ABNORMAL HIGH (ref 0.8–1.2)
Prothrombin Time: 16.1 seconds — ABNORMAL HIGH (ref 11.4–15.2)

## 2022-06-27 LAB — GLUCOSE, CAPILLARY
Glucose-Capillary: 168 mg/dL — ABNORMAL HIGH (ref 70–99)
Glucose-Capillary: 195 mg/dL — ABNORMAL HIGH (ref 70–99)

## 2022-06-27 SURGERY — INCISION AND DRAINAGE
Anesthesia: General | Site: Toe | Laterality: Right

## 2022-06-27 MED ORDER — PROPOFOL 1000 MG/100ML IV EMUL
INTRAVENOUS | Status: AC
  Filled 2022-06-27: qty 100

## 2022-06-27 MED ORDER — BUPIVACAINE HCL (PF) 0.5 % IJ SOLN
INTRAMUSCULAR | Status: AC
  Filled 2022-06-27: qty 30

## 2022-06-27 MED ORDER — POTASSIUM CHLORIDE CRYS ER 20 MEQ PO TBCR
40.0000 meq | EXTENDED_RELEASE_TABLET | Freq: Once | ORAL | Status: AC
  Administered 2022-06-27: 40 meq via ORAL
  Filled 2022-06-27: qty 2

## 2022-06-27 MED ORDER — OXYCODONE HCL 5 MG/5ML PO SOLN: 5.0000 mg | Freq: Once | ORAL | Status: DC | PRN

## 2022-06-27 MED ORDER — PHENYLEPHRINE 80 MCG/ML (10ML) SYRINGE FOR IV PUSH (FOR BLOOD PRESSURE SUPPORT)
PREFILLED_SYRINGE | INTRAVENOUS | Status: DC | PRN
  Administered 2022-06-27: 80 ug via INTRAVENOUS
  Administered 2022-06-27: 160 ug via INTRAVENOUS

## 2022-06-27 MED ORDER — GADOBUTROL 1 MMOL/ML IV SOLN
9.0000 mL | Freq: Once | INTRAVENOUS | Status: AC | PRN
  Administered 2022-06-27: 9 mL via INTRAVENOUS

## 2022-06-27 MED ORDER — SENNOSIDES-DOCUSATE SODIUM 8.6-50 MG PO TABS: 1.0000 | ORAL_TABLET | Freq: Every evening | ORAL | Status: DC | PRN

## 2022-06-27 MED ORDER — FENTANYL CITRATE (PF) 100 MCG/2ML IJ SOLN
INTRAMUSCULAR | Status: DC | PRN
  Administered 2022-06-27: 25 ug via INTRAVENOUS
  Administered 2022-06-27: 50 ug via INTRAVENOUS
  Administered 2022-06-27: 25 ug via INTRAVENOUS

## 2022-06-27 MED ORDER — LIDOCAINE HCL (CARDIAC) PF 100 MG/5ML IV SOSY
PREFILLED_SYRINGE | INTRAVENOUS | Status: DC | PRN
  Administered 2022-06-27: 80 mg via INTRAVENOUS

## 2022-06-27 MED ORDER — FENTANYL CITRATE (PF) 100 MCG/2ML IJ SOLN
INTRAMUSCULAR | Status: AC
  Filled 2022-06-27: qty 2

## 2022-06-27 MED ORDER — SODIUM CHLORIDE 0.9 % IV SOLN: INTRAVENOUS | Status: DC | PRN

## 2022-06-27 MED ORDER — NICOTINE 21 MG/24HR TD PT24
21.0000 mg | MEDICATED_PATCH | Freq: Every day | TRANSDERMAL | Status: DC
  Administered 2022-06-27 – 2022-07-06 (×10): 21 mg via TRANSDERMAL
  Filled 2022-06-27 (×10): qty 1

## 2022-06-27 MED ORDER — SUCCINYLCHOLINE CHLORIDE 200 MG/10ML IV SOSY
PREFILLED_SYRINGE | INTRAVENOUS | Status: DC | PRN
  Administered 2022-06-27: 120 mg via INTRAVENOUS

## 2022-06-27 MED ORDER — POLYETHYLENE GLYCOL 3350 17 G PO PACK
17.0000 g | PACK | Freq: Every day | ORAL | Status: DC
  Administered 2022-06-27 – 2022-07-03 (×7): 17 g via ORAL
  Filled 2022-06-27 (×8): qty 1

## 2022-06-27 MED ORDER — PROPOFOL 10 MG/ML IV BOLUS
INTRAVENOUS | Status: DC | PRN
  Administered 2022-06-27: 30 mg via INTRAVENOUS
  Administered 2022-06-27: 170 mg via INTRAVENOUS

## 2022-06-27 MED ORDER — NICOTINE POLACRILEX 2 MG MT GUM: 2.0000 mg | CHEWING_GUM | OROMUCOSAL | Status: DC | PRN

## 2022-06-27 MED ORDER — FENTANYL CITRATE (PF) 100 MCG/2ML IJ SOLN: 25.0000 ug | INTRAMUSCULAR | Status: DC | PRN

## 2022-06-27 MED ORDER — DEXMEDETOMIDINE HCL IN NACL 80 MCG/20ML IV SOLN
INTRAVENOUS | Status: DC | PRN
  Administered 2022-06-27: 12 ug via INTRAVENOUS

## 2022-06-27 MED ORDER — ONDANSETRON HCL 4 MG/2ML IJ SOLN
INTRAMUSCULAR | Status: DC | PRN
  Administered 2022-06-27: 4 mg via INTRAVENOUS

## 2022-06-27 MED ORDER — OXYCODONE HCL 5 MG PO TABS: 5.0000 mg | ORAL_TABLET | Freq: Once | ORAL | Status: DC | PRN

## 2022-06-27 MED ORDER — PROPOFOL 10 MG/ML IV BOLUS
INTRAVENOUS | Status: AC
  Filled 2022-06-27: qty 20

## 2022-06-27 SURGICAL SUPPLY — 62 items
BLADE MED AGGRESSIVE (BLADE) IMPLANT
BNDG CMPR 5X4 CHSV STRCH STRL (GAUZE/BANDAGES/DRESSINGS)
BNDG CMPR 5X4 KNIT ELC UNQ LF (GAUZE/BANDAGES/DRESSINGS) ×2
BNDG CMPR 5X6 CHSV STRCH STRL (GAUZE/BANDAGES/DRESSINGS)
BNDG CMPR 75X21 PLY HI ABS (MISCELLANEOUS) ×2
BNDG COHESIVE 4X5 TAN STRL LF (GAUZE/BANDAGES/DRESSINGS) ×3 IMPLANT
BNDG COHESIVE 6X5 TAN ST LF (GAUZE/BANDAGES/DRESSINGS) ×3 IMPLANT
BNDG ELASTIC 4INX 5YD STR LF (GAUZE/BANDAGES/DRESSINGS) ×3 IMPLANT
BNDG ESMARCH 4 X 12 STRL LF (GAUZE/BANDAGES/DRESSINGS)
BNDG ESMARCH 4X12 STRL LF (GAUZE/BANDAGES/DRESSINGS) ×3 IMPLANT
BNDG GAUZE DERMACEA FLUFF 4 (GAUZE/BANDAGES/DRESSINGS) ×3 IMPLANT
BNDG GZE 12X3 1 PLY HI ABS (GAUZE/BANDAGES/DRESSINGS) ×2
BNDG GZE DERMACEA 4 6PLY (GAUZE/BANDAGES/DRESSINGS)
BNDG STRETCH GAUZE 3IN X12FT (GAUZE/BANDAGES/DRESSINGS) ×3 IMPLANT
CUFF TOURN SGL QUICK 18X4 (TOURNIQUET CUFF) IMPLANT
CUFF TOURN SGL QUICK 24 (TOURNIQUET CUFF)
CUFF TRNQT CYL 24X4X16.5-23 (TOURNIQUET CUFF) IMPLANT
DRSG EMULSION OIL 3X8 NADH (GAUZE/BANDAGES/DRESSINGS) ×3 IMPLANT
DURAPREP 26ML APPLICATOR (WOUND CARE) ×3 IMPLANT
ELECT REM PT RETURN 9FT ADLT (ELECTROSURGICAL) ×2
ELECTRODE REM PT RTRN 9FT ADLT (ELECTROSURGICAL) ×3 IMPLANT
GAUZE PACKING 0.25INX5YD STRL (GAUZE/BANDAGES/DRESSINGS) IMPLANT
GAUZE SPONGE 4X4 12PLY STRL (GAUZE/BANDAGES/DRESSINGS) ×3 IMPLANT
GAUZE STRETCH 2X75IN STRL (MISCELLANEOUS) ×3 IMPLANT
GLOVE BIOGEL PI IND STRL 8 (GLOVE) ×3 IMPLANT
GLOVE SURG LX STRL 8.0 MICRO (GLOVE) ×3 IMPLANT
GOWN STRL REUS W/ TWL XL LVL3 (GOWN DISPOSABLE) ×3 IMPLANT
GOWN STRL REUS W/TWL MED LVL3 (GOWN DISPOSABLE) ×3 IMPLANT
GOWN STRL REUS W/TWL XL LVL3 (GOWN DISPOSABLE) ×2
HANDPIECE VERSAJET DEBRIDEMENT (MISCELLANEOUS) IMPLANT
IV NS 1000ML (IV SOLUTION) ×2
IV NS 1000ML BAXH (IV SOLUTION) ×3 IMPLANT
IV NS IRRIG 3000ML ARTHROMATIC (IV SOLUTION) IMPLANT
KIT TURNOVER KIT A (KITS) ×3 IMPLANT
LABEL OR SOLS (LABEL) ×3 IMPLANT
MANIFOLD NEPTUNE II (INSTRUMENTS) ×3 IMPLANT
NDL FILTER BLUNT 18X1 1/2 (NEEDLE) ×3 IMPLANT
NDL HYPO 25X1 1.5 SAFETY (NEEDLE) ×3 IMPLANT
NEEDLE FILTER BLUNT 18X1 1/2 (NEEDLE) ×2 IMPLANT
NEEDLE HYPO 25X1 1.5 SAFETY (NEEDLE) ×2 IMPLANT
NS IRRIG 500ML POUR BTL (IV SOLUTION) ×3 IMPLANT
PACK EXTREMITY ARMC (MISCELLANEOUS) ×3 IMPLANT
PACKING GAUZE IODOFORM 1INX5YD (GAUZE/BANDAGES/DRESSINGS) IMPLANT
PAD ABD DERMACEA PRESS 5X9 (GAUZE/BANDAGES/DRESSINGS) ×3 IMPLANT
PULSAVAC PLUS IRRIG FAN TIP (DISPOSABLE)
SOL PREP PVP 2OZ (MISCELLANEOUS) ×2
SOLUTION PREP PVP 2OZ (MISCELLANEOUS) ×3 IMPLANT
STAPLER SKIN PROX 35W (STAPLE) IMPLANT
STOCKINETTE IMPERVIOUS 9X36 MD (GAUZE/BANDAGES/DRESSINGS) ×3 IMPLANT
SUT ETHILON 2 0 FS 18 (SUTURE) IMPLANT
SUT ETHILON 4-0 (SUTURE)
SUT ETHILON 4-0 FS2 18XMFL BLK (SUTURE)
SUT PROLENE 3 0 PS 2 (SUTURE) IMPLANT
SUT VIC AB 3-0 SH 27 (SUTURE)
SUT VIC AB 3-0 SH 27X BRD (SUTURE) IMPLANT
SUT VIC AB 4-0 FS2 27 (SUTURE) IMPLANT
SUTURE ETHLN 4-0 FS2 18XMF BLK (SUTURE) IMPLANT
SWAB CULTURE AMIES ANAERIB BLU (MISCELLANEOUS) IMPLANT
SYR 10ML LL (SYRINGE) ×6 IMPLANT
TIP FAN IRRIG PULSAVAC PLUS (DISPOSABLE) IMPLANT
TRAP FLUID SMOKE EVACUATOR (MISCELLANEOUS) ×3 IMPLANT
WATER STERILE IRR 500ML POUR (IV SOLUTION) ×3 IMPLANT

## 2022-06-27 NOTE — ED Notes (Signed)
Inpatient MD at bedside

## 2022-06-27 NOTE — ED Notes (Signed)
Rounded on patient. Patient supine on stretcher with respirations even and non labored, patient without signs of distress. Patient denies needs at this time.  NAD. 

## 2022-06-27 NOTE — Transfer of Care (Signed)
Immediate Anesthesia Transfer of Care Note  Patient: Martin Mullen  Procedure(s) Performed: INCISION AND DRAINAGE (Right)  Patient Location: PACU  Anesthesia Type:General  Level of Consciousness: drowsy  Airway & Oxygen Therapy: Patient Spontanous Breathing and Patient connected to face mask oxygen  Post-op Assessment: Report given to RN  Post vital signs: stable  Last Vitals:  Vitals Value Taken Time  BP 98/66 06/27/22 0923  Temp    Pulse 93 06/27/22 0926  Resp 21 06/27/22 0926  SpO2 98 % 06/27/22 0926  Vitals shown include unvalidated device data.  Last Pain:  Vitals:   06/27/22 0625  TempSrc:   PainSc: 0-No pain         Complications: No notable events documented.

## 2022-06-27 NOTE — ED Notes (Signed)
Report given to OR. Pt remains in MRI at this time

## 2022-06-27 NOTE — Anesthesia Preprocedure Evaluation (Addendum)
Anesthesia Evaluation  Patient identified by MRN, date of birth, ID band Patient awake    Reviewed: Allergy & Precautions, H&P , NPO status , Patient's Chart, lab work & pertinent test results  History of Anesthesia Complications Negative for: history of anesthetic complications  Airway Mallampati: I  TM Distance: >3 FB Neck ROM: Full    Dental  (+) Edentulous Upper, Edentulous Lower, Dental Advidsory Given   Pulmonary neg shortness of breath, neg COPD, neg recent URI, Current Smoker and Patient abstained from smoking.   Pulmonary exam normal        Cardiovascular hypertension, (-) angina + CAD, + Past MI and + Cardiac Stents (2014)  (-) CABG negative cardio ROS Normal cardiovascular exam(-) dysrhythmias (-) Valvular Problems/Murmurs  Study Conclusions   - Left ventricle: The cavity size was normal. There was mild  concentric hypertrophy. Systolic function was normal. The  estimated ejection fraction was in the range of 55% to 60%. Wall  motion was normal; there were no regional wall motion  abnormalities.  - Left atrium: The atrium was mildly dilated.    Neuro/Psych  PSYCHIATRIC DISORDERS      negative neurological ROS  negative psych ROS   GI/Hepatic negative GI ROS, Neg liver ROS,GERD  ,,  Endo/Other  diabetes, Poorly Controlled    Renal/GU negative Renal ROS  negative genitourinary   Musculoskeletal   Abdominal   Peds  Hematology negative hematology ROS (+)   Anesthesia Other Findings Past Medical History: 08/28/2012: Acute transmural inferior wall MI (HCC)     Comment:  .5 x 12 mm Veri-flex stent non-DES. No date: Chicken pox No date: Coronary artery disease     Comment:  Inferior ST elevation myocardial infarction in July of               2014. Cardiac catheterization showed 95% distal RCA               stenosis and 90% first diagonal stenosis with normal               ejection fraction. He  underwent PCI in bare-metal stent               placement to the distal RCA. No date: Diabetes mellitus without complication (HCC) No date: Hypercholesterolemia No date: Stroke [facility]) No date: Tobacco use  Past Surgical History: No date: CORONARY ANGIOPLASTY WITH STENT PLACEMENT 09/03/2019: DEBRIDEMENT AND CLOSURE WOUND; N/A     Comment:  Procedure: Debridement and closure of scrotal wound;                Surgeon: Allena Napoleon, MD;  Location: [facility];  Service: Plastics;  Laterality: N/A; 07/25/2019: DEBRIDEMENT AND CLOSURE WOUND; N/A     Comment:  Procedure: DEBRIDEMENT AND CLOSURE WOUND;  Surgeon:               Allena Napoleon, MD;  Location: [facility];  Service:               Plastics;  Laterality: N/A; 07/20/2019: INCISION AND DRAINAGE ABSCESS; N/A     Comment:  Procedure: INCISION AND DRAINAGE ABSCESS;  Surgeon:               Crista Elliot, MD;  Location: [facility];  Service:               Urology;  Laterality: N/A;  08/28/2012: LEFT HEART CATHETERIZATION WITH CORONARY ANGIOGRAM; N/A     Comment:  Procedure: LEFT HEART CATHETERIZATION WITH CORONARY               ANGIOGRAM;  Surgeon: Pamella Pert, MD;  Location: [facility]              CATH LAB;  Service: Cardiovascular;  Laterality: N/A; 09/23/2020: TEE WITHOUT CARDIOVERSION; N/A     Comment:  Procedure: TRANSESOPHAGEAL ECHOCARDIOGRAM (TEE);                Surgeon: Lamar Blinks, MD;  Location: [facility];                Service: Cardiovascular;  Laterality: N/A; 1988: WRIST SURGERY  BMI    Body Mass Index: 29.26 kg/m      Reproductive/Obstetrics negative OB ROS                             Anesthesia Physical Anesthesia Plan  ASA: III  Anesthesia Plan: General   Post-op Pain Management:    Induction: Intravenous  PONV Risk Score and Plan: 3 and Ondansetron, Dexamethasone, Midazolam, Propofol infusion and TIVA  Airway Management Planned: Oral  ETT  Additional Equipment:   Intra-op Plan:   Post-operative Plan: Extubation in OR  Informed Consent: I have reviewed the patients History and Physical, chart, labs and discussed the procedure including the risks, benefits and alternatives for the proposed anesthesia with the patient or authorized representative who has indicated his/her understanding and acceptance.     Dental Advisory Given  Plan Discussed with: Anesthesiologist and CRNA  Anesthesia Plan Comments: (Patient consented for risks of anesthesia including but not limited to:  - adverse reactions to medications - risk of airway placement if required - damage to eyes, teeth, lips or other oral mucosa - nerve damage due to positioning  - sore throat or hoarseness - Damage to heart, brain, nerves, lungs, other parts of body or loss of life  Patient voiced understanding.)        Anesthesia Quick Evaluation

## 2022-06-27 NOTE — H&P (View-Only) (Signed)
Hospital Consult    Reason for Consult:  Ulceration of right foot, questionable ischemia Requesting Physician:  Dr Hazel Duncan MD MRN #:  [id number]  History of Present Illness: This is a Martin Mullen  presents to [facility] ER with medical history significant for STEMI s/p RCA stent 2014, hypertension, recurrent strokes, heterozygous prothrombin gene mutation on Eliquis, insulin requiring type 2 diabetes insulin-dependent type 2 diabetes poorly controlled, history of Fournier's gangrene, who presents to the ED with right lower extremity pain most notably in the right calf starting on the day of arrival, without swelling.  He denies any injury.   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   Chicken pox    Coronary artery disease    Inferior ST elevation myocardial infarction in July of 2014. Cardiac catheterization showed 95% distal RCA stenosis and 90% first diagonal stenosis with normal ejection fraction. He underwent PCI in bare-metal stent placement to the distal RCA.   Diabetes mellitus without complication (HCC)    Hypercholesterolemia    Stroke (HCC)    Tobacco use     Past Surgical History:  Procedure Laterality Date   CORONARY ANGIOPLASTY WITH STENT PLACEMENT     DEBRIDEMENT AND CLOSURE WOUND N/A 09/03/2019   Procedure: Debridement and closure of scrotal wound;  Surgeon: Juanito Gonyer, Collier S, MD;  Location: [facility];  Service: Plastics;  Laterality: N/A;   DEBRIDEMENT AND CLOSURE WOUND N/A 07/25/2019   Procedure: DEBRIDEMENT AND CLOSURE WOUND;  Surgeon: Stclair Szymborski, Collier S, MD;  Location: [facility];  Service: Plastics;  Laterality: N/A;   INCISION AND DRAINAGE ABSCESS N/A 07/20/2019   Procedure: INCISION AND DRAINAGE ABSCESS;  Surgeon: Bell, Eugene D III, MD;  Location: [facility];  Service: Urology;  Laterality: N/A;   LEFT HEART CATHETERIZATION WITH CORONARY ANGIOGRAM N/A 08/28/2012    Procedure: LEFT HEART CATHETERIZATION WITH CORONARY ANGIOGRAM;  Surgeon: Jagadeesh R Ganji, MD;  Location: [facility];  Service: Cardiovascular;  Laterality: N/A;   TEE WITHOUT CARDIOVERSION N/A 09/23/2020   Procedure: TRANSESOPHAGEAL ECHOCARDIOGRAM (TEE);  Surgeon: Kowalski, Bruce J, MD;  Location: [facility];  Service: Cardiovascular;  Laterality: N/A;   WRIST SURGERY  1988    No Known Allergies  Prior to Admission medications   Medication Sig Start Date End Date Taking? Authorizing Provider  acetaminophen (TYLENOL) 500 MG tablet Take 2 tablets (1,000 mg total) by mouth every 6 (six) hours as needed. 01/25/22 01/25/23 Yes Simpson, Brandon Lee, PA  apixaban (ELIQUIS) 5 MG TABS tablet Take 1 tablet (5 mg total) by mouth 2 (two) times daily. 02/10/22  Yes [name], Regina W, NP  aspirin EC 81 MG tablet Take 1 tablet (81 mg total) by mouth daily. 02/10/22  Yes [name], Regina W, NP  buPROPion (WELLBUTRIN XL) 150 MG 24 hr tablet Take 1 tablet (150 mg total) by mouth daily. 02/10/22  Yes [name], Regina W, NP  insulin glargine (LANTUS SOLOSTAR) 100 UNIT/ML Solostar Pen Inject 16 Units into the skin daily. 05/10/22 08/08/22 Yes [name], Regina W, NP  losartan (COZAAR) 25 MG tablet Take 1 tablet (25 mg total) by mouth daily. 05/10/22  Yes [name], Regina W, NP  metFORMIN (GLUCOPHAGE) 1000 MG tablet Take 1 tablet (1,000 mg total) by mouth 2 (two) times daily with a meal. TAKE 1 TABLET(1000 MG) BY MOUTH TWICE DAILY WITH A MEAL 02/10/22  Yes [name], Regina W, NP  OZEMPIC, 0.25   OR 0.5 MG/DOSE, 2 MG/3ML SOPN Inject 0.5 mg into the skin once a week. Start with 0.25mg weekly x 4 weeks then increase to 0.5mg weekly injection. 04/07/22  Yes [name], Regina W, NP  rosuvastatin (CRESTOR) 20 MG tablet Take 1 tablet (20 mg total) by mouth daily. 02/10/22  Yes [name], Regina W, NP  sildenafil (VIAGRA) 50 MG tablet Take 1 tablet (50 mg total) by mouth daily as needed for erectile dysfunction. 04/11/22  Yes [name], Regina W, NP  Accu-Chek  Softclix Lancets lancets Use to check blood sugar three times a day for type 2 diabetes E11.9 03/22/22   [name], Regina W, NP  glucose blood (ACCU-CHEK GUIDE) test strip Use to check blood sugar three times a day for type 2 diabetes E11.9 03/22/22   [name], Regina W, NP  HYDROcodone-acetaminophen (NORCO/VICODIN) 5-325 MG tablet Take 1 tablet by mouth every 4 (four) hours as needed for moderate pain. Patient not taking: Reported on 06/26/2022 06/07/22 06/07/23  Cuthriell, Jonathan D, PA-C  Multiple Vitamin (MULTIVITAMIN WITH MINERALS) TABS tablet Take 1 tablet by mouth daily. One-A-Day Multivitamin Patient not taking: Reported on 06/26/2022 09/28/21   [name], Dawood S, MD  nicotine (NICODERM CQ - DOSED IN MG/24 HOURS) 21 mg/24hr patch Place 21 mg onto the skin daily.    [provider]    Social History   Socioeconomic History   Marital status: Divorced    Spouse name: Not on file   Number of children: 2   Years of education: Not on file   Highest education level: Not on file  Occupational History   Not on file  Tobacco Use   Smoking status: Every Day    Packs/day: 1.00    Years: 30.00    Additional pack years: 0.00    Total pack years: 30.00    Types: Cigarettes   Smokeless tobacco: Never  Vaping Use   Vaping Use: Never used  Substance and Sexual Activity   Alcohol use: Not Currently    Alcohol/week: 0.0 standard drinks of alcohol    Comment: occasional   Drug use: Not Currently    Types: Marijuana   Sexual activity: Yes    Birth control/protection: None  Other Topics Concern   Not on file  Social History Narrative   Lives with girlfriend and her mother    Social Determinants of Health   Financial Resource Strain: Low Risk  (06/10/2022)   Overall Financial Resource Strain (CARDIA)    Difficulty of Paying Living Expenses: Not very hard  Food Insecurity: No Food Insecurity (06/10/2022)   Hunger Vital Sign    Worried About Running Out of Food in the Last Year: Never true     Ran Out of Food in the Last Year: Never true  Transportation Needs: No Transportation Needs (06/10/2022)   PRAPARE - Transportation    Lack of Transportation (Medical): No    Lack of Transportation (Non-Medical): No  Physical Activity: Not on file  Stress: Not on file  Social Connections: Not on file  Intimate Partner Violence: Not At Risk (06/10/2022)   Humiliation, Afraid, Rape, and Kick questionnaire    Fear of Current or Ex-Partner: No    Emotionally Abused: No    Physically Abused: No    Sexually Abused: No     Family History  Problem Relation Age of Onset   Arthritis Mother        Rheumatoid   Diabetes Mother    Hyperlipidemia Mother    Diabetes Father      Cancer Maternal Grandfather        Prostate   Parkinson's disease Maternal Grandfather     ROS: Otherwise negative unless mentioned in HPI  Physical Examination  Vitals:   06/27/22 0430 06/27/22 0620  BP: (!) 135/91 125/80  Pulse: 96 (!) 106  Resp: 20 20  Temp:  99.5 F (37.5 C)  SpO2: 100% 97%   Body mass index is 29.26 kg/m.  General:  WDWN in NAD Gait: Not observed HENT: WNL, normocephalic Pulmonary: normal non-labored breathing, without Rales, rhonchi,  wheezing Cardiac: regular, without  Murmurs, rubs or gallops; without carotid bruits Abdomen: Positive bowel sounds, soft, NT/ND, no masses Skin: without rashes Vascular Exam/Pulses: Right lower extremity with +2 Edema, Open sore bottom of right foot. Unable to palpate pulses due to edema.  Extremities: with ischemic changes, with Gangrene , with cellulitis; with open wounds;  Musculoskeletal: no muscle wasting or atrophy  Neurologic: A&O X 3;  No focal weakness or paresthesias are detected; speech is fluent/normal Psychiatric:  The pt has Normal affect. Lymph:  Unremarkable  CBC    Component Value Date/Time   WBC 22.2 (H) 06/27/2022 0511   RBC 4.64 06/27/2022 0511   HGB 14.4 06/27/2022 0511   HGB 17.9 (H) 08/11/2021 0937   HCT Martin.2  06/27/2022 0511   HCT 51.0 08/11/2021 0937   PLT 263 06/27/2022 0511   PLT 261 08/11/2021 0937   MCV 90.9 06/27/2022 0511   MCV 93 08/11/2021 0937   MCV 93 01/29/2013 0040   MCH 31.0 06/27/2022 0511   MCHC 34.1 06/27/2022 0511   RDW 12.2 06/27/2022 0511   RDW 12.4 08/11/2021 0937   RDW 13.8 01/29/2013 0040   LYMPHSABS 1.9 06/26/2022 1956   LYMPHSABS 4.8 (H) 08/11/2021 0937   MONOABS 1.3 (H) 06/26/2022 1956   EOSABS 0.1 06/26/2022 1956   EOSABS 0.5 (H) 08/11/2021 0937   BASOSABS 0.1 06/26/2022 1956   BASOSABS 0.1 08/11/2021 0937    BMET    Component Value Date/Time   NA 131 (L) 06/27/2022 0511   NA 140 08/11/2021 0937   NA 139 01/29/2013 0040   K 3.4 (L) 06/27/2022 0511   K 4.0 01/29/2013 0040   CL 99 06/27/2022 0511   CL 107 01/29/2013 0040   CO2 21 (L) 06/27/2022 0511   CO2 28 01/29/2013 0040   GLUCOSE 222 (H) 06/27/2022 0511   GLUCOSE 228 (H) 01/29/2013 0040   BUN 9 06/27/2022 0511   BUN 11 08/11/2021 0937   BUN 15 01/29/2013 0040   CREATININE 0.82 06/27/2022 0511   CREATININE 1.03 05/10/2022 1501   CALCIUM 8.0 (L) 06/27/2022 0511   CALCIUM 9.2 01/29/2013 0040   GFRNONAA >60 06/27/2022 0511   GFRNONAA >60 01/29/2013 0040   GFRAA 104 11/13/2019 1550   GFRAA >60 01/29/2013 0040    COAGS: Lab Results  Component Value Date   INR 1.3 (H) 06/27/2022   INR 1.0 09/11/2021   INR 0.9 08/26/2020     Non-Invasive Vascular Imaging:   EXAM: 06/27/22 MRI OF THE RIGHT FOREFOOT WITHOUT AND WITH CONTRAST   TECHNIQUE: Multiplanar, multisequence MR imaging of the right forefoot was performed before and after the administration of intravenous contrast.   CONTRAST:  9mL GADAVIST GADOBUTROL 1 MMOL/ML IV SOLN   COMPARISON:  X-ray 06/26/2022   FINDINGS: Bones/Joint/Cartilage   Bone marrow edema and enhancement with intermediate T1 marrow signal within the second metatarsal head and second toe proximal phalanx. Small-moderate second MTP joint effusion.   Prominent    bone marrow edema with intermediate T1 marrow signal within the base of the third toe proximal phalanx. There is also bone marrow edema and enhancement within the third metatarsal head. Small third MTP joint effusion.   Remaining osseous structures are otherwise intact without significant marrow signal abnormality. No fracture or dislocation.   Ligaments   Intact Lisfranc ligament.  Intact collateral ligaments.   Muscles and Tendons   Flexor tenosynovitis of the second digit within the distal forefoot (series 9, images 16-17). There is also tenosynovitis of the second digit extensor tendon at the level of the second metatarsal neck. Denervation changes of the foot musculature.   Soft tissues   Plantar wound or ulceration underlying the second MTP joint. Extensive surrounding soft tissue edema. Poor enhancement of the soft tissues at this location is concerning for tissue necrosis (series 13, images 15-17). No drainable fluid collection.   IMPRESSION: 1. Plantar wound or ulceration underlying the second MTP joint. Extensive surrounding soft tissue edema compatible with cellulitis. Poor enhancement of the soft tissues at this location is concerning for tissue necrosis. No drainable fluid collection. 2. Acute osteomyelitis of the second metatarsal head and second toe proximal phalanx. Small-moderate second MTP joint effusion is suggestive of septic arthritis. 3. Acute osteomyelitis of the third metatarsal head and base of the third toe proximal phalanx. Small third MTP joint effusion, also concerning for septic arthritis. 4. Flexor tenosynovitis of the second digit within the distal forefoot. There is also tenosynovitis of the second digit extensor tendon at the level of the second metatarsal neck.  Statin:  Yes.   Beta Blocker:  No. Aspirin:  Yes.   ACEI:  No. ARB:  No. CCB use:  No Other antiplatelets/anticoagulants:  Yes.   Eliquis 5 mg Twice Daily   ASSESSMENT/PLAN:  This is a Martin Mullen who presents to [facility]'s emergency department with right lower extremity pain.  On workup he was noted to have cellulitis of his right foot with an open foot ulcer.  Workup included MRI which shows osteo myelitis of the second toe and the third toe.  Vascular surgery was consulted to determine blood flow to the right lower extremity.  PLAN: Vascular surgery plans on taking the patient to the vascular lab on 06/29/22 for right lower extremity angiogram with possible intervention.  I discussed in detail with the patient this morning the procedure, benefits, risks, and complications.  Verbalizes understanding.  All the patient's questions this morning.  He wishes to proceed with the procedure.  Patient will be made n.p.o. after midnight on Wednesday, 06/29/2022.   -Discussed the plan in detail with Dr. Jason Dew MD and he agrees with the plan.   Amos Gaber R Terr[facility] 06/27/2022 7:27 AM  

## 2022-06-27 NOTE — Op Note (Signed)
Surgeon: Surgeon(s): Candelaria Stagers, DPM  Assistants: None Pre-operative diagnosis: Right Washout Incision and Drainage  Post-operative diagnosis: same Procedure: Procedure(s) (LRB): INCISION AND DRAINAGE (Right)  Pathology:  ID Type Source Tests Collected by Time Destination  1 : right second toe Tissue PATH Other SURGICAL PATHOLOGY Candelaria Stagers, DPM 06/27/2022 7829   2 : right metarsal bone biopsy Tissue PATH Other SURGICAL PATHOLOGY Candelaria Stagers, DPM 06/27/2022 0913   A : right metatarsal bone biopsy for culture Tissue Wound AEROBIC/ANAEROBIC CULTURE W GRAM STAIN (SURGICAL/DEEP WOUND) Candelaria Stagers, DPM 06/27/2022 5621     Pertinent Intra-op findings: Minimal flow noted.  Superficial purulent drainage noted about the second digit.  All purulent drainage was expressed.  Second metatarsal head osteomyelitis appreciated.  Third metatarsal head bone samples were taken* Anesthesia: Choice  Hemostasis: * Missing tourniquet times found for documented tourniquets in log: [id number] * EBL: 50 cc Materials: Iodoform packing Injectables: None Complications: None  Indications for surgery: A 42 y.o. male presents with right foot abscess with osteomyelitis of the second metatarsal possible third metatarsal. Patient has failed all conservative therapy including but not limited to local wound care IV antibiotics. He wishes to have surgical correction of the foot/deformity. It was determined that patient would benefit from right second ray amputation with incision drainage washout debridement. Informed surgical risk consent was reviewed and read aloud to the patient.  I reviewed the films.  I have discussed my findings with the patient in great detail.  I have discussed all risks including but not limited to infection, stiffness, scarring, limp, disability, deformity, damage to blood vessels and nerves, numbness, poor healing, need for braces, arthritis, chronic pain, amputation, death.  All benefits and  realistic expectations discussed in great detail.  I have made no promises as to the outcome.  I have provided realistic expectations.  I have offered the patient a 2nd opinion, which they have declined and assured me they preferred to proceed despite the risks   Procedure in detail: The patient was both verbally and visually identified by myself, the nursing staff, and anesthesia staff in the preoperative holding area. They were then transferred to the operating room and placed on the operative table in supine position.  Attention was directed to the right second digit.  Using skin marker of the face most incision was delineated using #15 blade incision was carried down from epidermal dermal junction down to the level of the bone.  The second digit was disarticulated.  Using sagittal saw the second metatarsal head was resected.  The proximal margin appears to be hard and indurated.  Purulent drainage was noted at the metatarsophalangeal joint of the second.  Did not appear to be intervening the third metatarsal head.  All tissue planes were thoroughly explored.  No further purulent drainage noted.  The wound was thoroughly irrigated with pulse lavage and 3 L normal saline solution.  At this time no further purulent drainage noted.  Bone samples were taken of the third metatarsal head sent to microbiology and pathology in standard and sterile technique.  The wound was packed open with iodoform packing 4 x 4 gauze Kerlix Ace bandage.  All bony prominences adequately padded.  Patient will need to return to the operating room for closure.  Patient will also need vascular involvement as well.  At the conclusion of the procedure the patient was awoken from anesthesia and found to have tolerated the procedure well any complications. There were transferred to PACU with  vital signs stable and vascular status intact.  Nicholes Rough, DPM

## 2022-06-27 NOTE — Progress Notes (Addendum)
Progress Note    Martin Mullen  ZOX:[id number] DOB: Jul 06, 1980  DOA: 06/26/2022 PCP: [name], NP      Brief Narrative:    Medical records reviewed and are as summarized below:  Martin Mullen is a 42 y.o. 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  He presented to the hospital because of pain in the right leg.   He was admitted to the hospital for sepsis secondary to MRSA bacteremia, right foot cellulitis,  acute osteomyelitis of right second metatarsal head and second toe proximal phalanx, septic arthritis of right second MTP joint and right diabetic foot ulcer.   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   Sepsis secondary to MRSA bacteremia, right foot cellulitis, acute osteomyelitis of right second metatarsal head and second toe proximal phalanx, septic arthritis of right second MTP joint, right diabetic foot ulcer: S/p washout I&D of right foot infection on 06/27/2022.  Plan for right lower extremity angiogram tomorrow. He is on IV vancomycin and IV cefepime.  Discontinue IV fluids.  Consulted Dr.  Rivka Safer, ID specialist to assist with management because of MRSA bacteremia. Follow-up with podiatrist and vascular surgeon.   Hypokalemia: Replete potassium and monitor levels.   Hyponatremia: Repeat BMP tomorrow    CAD with bare-metal stent to distal RCA in 2014, PVD: Continue aspirin, Lipitor   Heterozygous for prothrombin gene 20210A mutation, recurrent strokes: Continue Eliquis   Type II DM with hyperglycemia: Continue insulin glargine and NovoLog as needed for hyperglycemia.  Hemoglobin A1c about a month prior to admission was 8.2.   Other comorbidities include hypertension, hyperlipidemia, history of Fournier's gangrene   Diet Order             Diet Carb Modified Fluid consistency: Thin; Room service appropriate? Yes  Diet effective now                            Consultants: Podiatrist  Procedures: S/p washout and I&D of right foot infection    Medications:    apixaban  5 mg Oral BID   aspirin EC  81 mg Oral Daily   buPROPion  150 mg Oral Daily   insulin aspart  0-15 Units Subcutaneous TID WC   insulin aspart  0-5 Units Subcutaneous QHS   insulin glargine-yfgn  16 Units Subcutaneous Daily   losartan  25 mg Oral Daily   nicotine  21 mg Transdermal Daily   potassium chloride  40 mEq Oral Once   rosuvastatin  20 mg Oral Daily   Continuous Infusions:  ceFEPime (MAXIPIME) IV Stopped (06/27/22 0536)   vancomycin  Anti-infectives (From admission, onward)    Start     Dose/Rate Route Frequency Ordered Stop   06/27/22 1000  vancomycin (VANCOREADY) IVPB 1500 mg/300 mL  Status:  Discontinued        1,500 mg 150 mL/hr over 120 Minutes Intravenous Every 12 hours 06/26/22 2227 06/26/22 2249   06/27/22 1000  vancomycin (VANCOREADY) IVPB 1500 mg/300 mL        1,500 mg 150 mL/hr over 120 Minutes Intravenous Every 12 hours 06/26/22 2249 07/04/22 0959   06/27/22 0500  ceFEPIme (MAXIPIME) 2 g in sodium chloride 0.9 % 100 mL IVPB  Status:   Discontinued        2 g 200 mL/hr over 30 Minutes Intravenous Every 8 hours 06/26/22 2225 06/26/22 2249   06/27/22 0500  ceFEPIme (MAXIPIME) 2 g in sodium chloride 0.9 % 100 mL IVPB        2 g 200 mL/hr over 30 Minutes Intravenous Every 8 hours 06/26/22 2249 07/04/22 0459   06/26/22 2230  vancomycin (VANCOREADY) IVPB 1250 mg/250 mL        1,250 mg 166.7 mL/hr over 90 Minutes Intravenous  Once 06/26/22 2223 06/27/22 0147   06/26/22 2030  vancomycin (VANCOCIN) IVPB 1000 mg/200 mL premix        1,000 mg 200 mL/hr over 60 Minutes Intravenous  Once 06/26/22 2026 06/26/22 2304   06/26/22 2030  ceFEPIme (MAXIPIME) 2 g in sodium chloride 0.9 % 100 mL IVPB        2 g 200 mL/hr over 30 Minutes Intravenous  Once 06/26/22 2026 06/26/22 2139              Family Communication/Anticipated D/C date and plan/Code Status   DVT prophylaxis:  apixaban (ELIQUIS) tablet 5 mg     Code Status: Full Code  Family Communication: None Disposition Plan: Plan to discharge home 4 to 5 days   Status is: Inpatient Remains inpatient appropriate because: Sepsis on IV antibiotics       Subjective:   Interval events noted.  No pain in the right foot.  He feels better.  Objective:    Vitals:   06/27/22 1315 06/27/22 1404 06/27/22 1500 06/27/22 1520  BP: 109/73 111/73 123/81 122/81  Pulse: 94 94 100   Resp: 16 15 14 18   Temp:   99.1 F (37.3 C) 98.6 F (37 C)  TempSrc:    Oral  SpO2: 93% 93% 96% 98%  Weight:       No data found.   Intake/Output Summary (Last 24 hours) at 06/27/2022 1711 Last data filed at 06/27/2022 1502 Gross per 24 hour  Intake 1000 ml  Output --  Net 1000 ml   Filed Weights   06/26/22 1838  Weight: 92.5 kg    Exam:  GEN: NAD SKIN: Warm and dry EYES: No pallor or icterus ENT: MMM CV: RRR PULM: CTA B ABD: soft, ND, NT, +BS CNS: AAO x 3, non focal EXT: Right leg edema. Dressing on right foot is clean, dry and intact        Data Reviewed:   I  have personally reviewed following labs and imaging studies:  Labs: Labs show the following:   Basic Metabolic Panel: Recent Labs  Lab 06/26/22 1956 06/27/22 0511  NA 133* 131*  K 3.4* 3.4*  CL 95* 99  CO2 27 21*  GLUCOSE 293* 222*  BUN 10 9  CREATININE 0.94 0.82  CALCIUM 8.8* 8.0*   GFR Estimated Creatinine Clearance: 134.1 mL/min (  by C-G formula based on SCr of 0.82 mg/dL). Liver Function Tests: Recent Labs  Lab 06/26/22 1956  AST 13*  ALT 8  ALKPHOS 125  BILITOT 1.0  PROT 7.8  ALBUMIN 3.4*   No results for input(s): "LIPASE", "AMYLASE" in the last 168 hours. No results for input(s): "AMMONIA" in the last 168 hours. Coagulation profile Recent Labs  Lab 06/27/22 0511  INR 1.3*    CBC: Recent Labs  Lab 06/26/22 1956 06/27/22 0511  WBC 23.7* 22.2*  NEUTROABS 20.2*  --   HGB 15.6 14.4  HCT 45.5 42.2  MCV 89.9 90.9  PLT 321 263   Cardiac Enzymes: No results for input(s): "CKTOTAL", "CKMB", "CKMBINDEX", "TROPONINI" in the last 168 hours. BNP (last 3 results) No results for input(s): "PROBNP" in the last 8760 hours. CBG: Recent Labs  Lab 06/27/22 0122 06/27/22 0527 06/27/22 0800 06/27/22 0925 06/27/22 1530  GLUCAP 229* 192* 221* 211* 168*   D-Dimer: No results for input(s): "DDIMER" in the last 72 hours. Hgb A1c: No results for input(s): "HGBA1C" in the last 72 hours. Lipid Profile: No results for input(s): "CHOL", "HDL", "LDLCALC", "TRIG", "CHOLHDL", "LDLDIRECT" in the last 72 hours. Thyroid function studies: No results for input(s): "TSH", "T4TOTAL", "T3FREE", "THYROIDAB" in the last 72 hours.  Invalid input(s): "FREET3" Anemia work up: No results for input(s): "VITAMINB12", "FOLATE", "FERRITIN", "TIBC", "IRON", "RETICCTPCT" in the last 72 hours. Sepsis Labs: Recent Labs  Lab 06/26/22 1956 06/26/22 2319 06/27/22 0511  PROCALCITON  --   --  0.46  WBC 23.7*  --  22.2*  LATICACIDVEN 2.8* 1.4  --     Microbiology Recent Results (from  the past 240 hour(s))  Blood culture (single)     Status: None (Preliminary result)   Collection Time: 06/26/22  7:56 PM   Specimen: BLOOD  Result Value Ref Range Status   Specimen Description BLOOD RIGHT ANTECUBITAL  Final   Special Requests   Final    BOTTLES DRAWN AEROBIC AND ANAEROBIC Blood Culture adequate volume   Culture  Setup Time   Final    GRAM POSITIVE COCCI AEROBIC BOTTLE ONLY Organism ID to follow CRITICAL RESULT CALLED TO, READ BACK BY AND VERIFIED WITH: [facility] MITCHELL 06/27/2022 1124 CP Performed at [facility], [address] [place], [place] [address]    Culture GRAM POSITIVE COCCI  Final   Report Status PENDING  Incomplete  Blood Culture ID Panel (Reflexed)     Status: Abnormal   Collection Time: 06/26/22  7:56 PM  Result Value Ref Range Status   Enterococcus faecalis NOT DETECTED NOT DETECTED Final   Enterococcus Faecium NOT DETECTED NOT DETECTED Final   Listeria monocytogenes NOT DETECTED NOT DETECTED Final   Staphylococcus species DETECTED (A) NOT DETECTED Final    Comment: CRITICAL RESULT CALLED TO, READ BACK BY AND VERIFIED WITH: DEVAN MITCHELL 06/27/2022 1124 CP    Staphylococcus aureus (BCID) DETECTED (A) NOT DETECTED Final    Comment: Methicillin (oxacillin)-resistant Staphylococcus aureus (MRSA). MRSA is predictably resistant to beta-lactam antibiotics (except ceftaroline). Preferred therapy is vancomycin unless clinically contraindicated. Patient requires contact precautions if  hospitalized. CRITICAL RESULT CALLED TO, READ BACK BY AND VERIFIED WITH: DEVAN MITCHELL 06/27/2022 1124 CP    Staphylococcus epidermidis NOT DETECTED NOT DETECTED Final   Staphylococcus lugdunensis NOT DETECTED NOT DETECTED Final   Streptococcus species NOT DETECTED NOT DETECTED Final   Streptococcus agalactiae NOT DETECTED NOT DETECTED Final   Streptococcus pneumoniae NOT DETECTED NOT DETECTED Final   Streptococcus pyogenes  NOT DETECTED NOT DETECTED Final    A.calcoaceticus-baumannii NOT DETECTED NOT DETECTED Final   Bacteroides fragilis NOT DETECTED NOT DETECTED Final   Enterobacterales NOT DETECTED NOT DETECTED Final   Enterobacter cloacae complex NOT DETECTED NOT DETECTED Final   Escherichia coli NOT DETECTED NOT DETECTED Final   Klebsiella aerogenes NOT DETECTED NOT DETECTED Final   Klebsiella oxytoca NOT DETECTED NOT DETECTED Final   Klebsiella pneumoniae NOT DETECTED NOT DETECTED Final   Proteus species NOT DETECTED NOT DETECTED Final   Salmonella species NOT DETECTED NOT DETECTED Final   Serratia marcescens NOT DETECTED NOT DETECTED Final   Haemophilus influenzae NOT DETECTED NOT DETECTED Final   Neisseria meningitidis NOT DETECTED NOT DETECTED Final   Pseudomonas aeruginosa NOT DETECTED NOT DETECTED Final   Stenotrophomonas maltophilia NOT DETECTED NOT DETECTED Final   Candida albicans NOT DETECTED NOT DETECTED Final   Candida auris NOT DETECTED NOT DETECTED Final   Candida glabrata NOT DETECTED NOT DETECTED Final   Candida krusei NOT DETECTED NOT DETECTED Final   Candida parapsilosis NOT DETECTED NOT DETECTED Final   Candida tropicalis NOT DETECTED NOT DETECTED Final   Cryptococcus neoformans/gattii NOT DETECTED NOT DETECTED Final   Meth resistant mecA/C and MREJ DETECTED (A) NOT DETECTED Final    Comment: CRITICAL RESULT CALLED TO, READ BACK BY AND VERIFIED WITH: [facility] MITCHELL 06/27/2022 1124 CP Performed at [facility], [address] [place], [place] [address]   Blood culture (single)     Status: None (Preliminary result)   Collection Time: 06/26/22  8:04 PM   Specimen: BLOOD  Result Value Ref Range Status   Specimen Description BLOOD LEFT ANTECUBITAL  Final   Special Requests   Final    BOTTLES DRAWN AEROBIC AND ANAEROBIC Blood Culture adequate volume   Culture   Final    NO GROWTH < 12 HOURS Performed at [facility], [address] [place], [place] [address]    Report Status PENDING  Incomplete   Aerobic/Anaerobic Culture w Gram Stain (surgical/deep wound)     Status: None (Preliminary result)   Collection Time: 06/27/22  9:09 AM   Specimen: Wound; Tissue  Result Value Ref Range Status   Specimen Description   Final    BONE Performed at [facility], [address], [place], [place] [address]    Special Requests   Final    NONE Performed at [facility], [address] [place], [place] [address]    Gram Stain   Final    NO WBC SEEN NO ORGANISMS SEEN Performed at [facility], [address], [place], [place] [address]    Culture PENDING  Incomplete   Report Status PENDING  Incomplete    Procedures and diagnostic studies:  MR FOOT RIGHT W WO CONTRAST  Result Date: 06/27/2022 CLINICAL DATA:  Foot swelling, diabetic, osteomyelitis suspected, xray done EXAM: MRI OF THE RIGHT FOREFOOT WITHOUT AND WITH CONTRAST TECHNIQUE: Multiplanar, multisequence MR imaging of the right forefoot was performed before and after the administration of intravenous contrast. CONTRAST:  9mL GADAVIST GADOBUTROL 1 MMOL/ML IV SOLN COMPARISON:  X-ray 06/26/2022 FINDINGS: Bones/Joint/Cartilage Bone marrow edema and enhancement with intermediate T1 marrow signal within the second metatarsal head and second toe proximal phalanx. Small-moderate second MTP joint effusion. Prominent bone marrow edema with intermediate T1 marrow signal within the base of the third toe proximal phalanx. There is also bone marrow edema and enhancement within the third metatarsal head. Small third MTP joint effusion.  Remaining osseous structures are otherwise intact without significant marrow signal abnormality. No fracture or dislocation. Ligaments Intact Lisfranc ligament.  Intact collateral ligaments. Muscles and Tendons Flexor tenosynovitis of the second digit within the distal forefoot (series 9, images 16-17). There is also tenosynovitis of the second digit extensor tendon at the level of the second metatarsal  neck. Denervation changes of the foot musculature. Soft tissues Plantar wound or ulceration underlying the second MTP joint. Extensive surrounding soft tissue edema. Poor enhancement of the soft tissues at this location is concerning for tissue necrosis (series 13, images 15-17). No drainable fluid collection. IMPRESSION: 1. Plantar wound or ulceration underlying the second MTP joint. Extensive surrounding soft tissue edema compatible with cellulitis. Poor enhancement of the soft tissues at this location is concerning for tissue necrosis. No drainable fluid collection. 2. Acute osteomyelitis of the second metatarsal head and second toe proximal phalanx. Small-moderate second MTP joint effusion is suggestive of septic arthritis. 3. Acute osteomyelitis of the third metatarsal head and base of the third toe proximal phalanx. Small third MTP joint effusion, also concerning for septic arthritis. 4. Flexor tenosynovitis of the second digit within the distal forefoot. There is also tenosynovitis of the second digit extensor tendon at the level of the second metatarsal neck. Electronically Signed   By: Duanne Guess D.O.   On: 06/27/2022 08:25   DG Foot Complete Right  Result Date: 06/26/2022 CLINICAL DATA:  Second toe ulcer, initial encounter EXAM: RIGHT FOOT COMPLETE - 3+ VIEW COMPARISON:  None Available. FINDINGS: Soft tissue ulcer is noted near the second MTP joint. No erosive changes are identified to suggest osteomyelitis. Tiny densities are noted adjacent to the soft tissue wound. These may represent small foreign bodies. IMPRESSION: Soft tissue wound without bony erosive change. Question small foreign bodies within the wound. Electronically Signed   By: Alcide Clever M.D.   On: 06/26/2022 20:13   US Venous Img Lower Unilateral Right  Result Date: 06/26/2022 CLINICAL DATA:  Right lower extremity pain EXAM: RIGHT LOWER EXTREMITY VENOUS DOPPLER ULTRASOUND TECHNIQUE: Gray-scale sonography with compression, as  well as color and duplex ultrasound, were performed to evaluate the deep venous system(s) from the level of the common femoral vein through the popliteal and proximal calf veins. COMPARISON:  None Available. FINDINGS: VENOUS Normal compressibility of the common femoral, superficial femoral, and popliteal veins, as well as the visualized calf veins. Visualized portions of profunda femoral vein and great saphenous vein unremarkable. No filling defects to suggest DVT on grayscale or color Doppler imaging. Doppler waveforms show normal direction of venous flow, normal respiratory plasticity and response to augmentation. Limited views of the contralateral common femoral vein are unremarkable. OTHER Several prominent lymph nodes in the right groin. Limitations: none IMPRESSION: 1.  Negative for DVT in the right lower extremity. 2.  Several nonspecific prominent lymph nodes in the right groin. Electronically Signed   By: Emmaline Kluver M.D.   On: 06/26/2022 19:53               LOS: 1 day   Rey Dansby  [facility]   Pager on [email or web address]. If 7PM-7AM, please contact night-coverage at [email or web address]     06/27/2022, 5:11 PM

## 2022-06-27 NOTE — Progress Notes (Signed)
PHARMACY - PHYSICIAN COMMUNICATION CRITICAL VALUE ALERT - BLOOD CULTURE IDENTIFICATION (BCID)  Martin Mullen is an 42 y.o. male who presented to [facility] on 06/26/2022 with a chief complaint of R foot pain.    Assessment:  Blood cultures from 5/12 with GPC in 1 of 4 cottles currently, BCID MRSA. Source if DFU/DFO  Name of physician (or Provider) Contacted: Drs Rivka Safer and Myriam Forehand   Current antibiotics: Vancomycin and cefepime  Changes to prescribed antibiotics recommended:  Patient is on recommended antibiotics - No changes needed - automatic ID consult  Results for orders placed or performed during the hospital encounter of 06/26/22  Blood Culture ID Panel (Reflexed) (Collected: 06/26/2022  7:56 PM)  Result Value Ref Range   Enterococcus faecalis NOT DETECTED NOT DETECTED   Enterococcus Faecium NOT DETECTED NOT DETECTED   Listeria monocytogenes NOT DETECTED NOT DETECTED   Staphylococcus species DETECTED (A) NOT DETECTED   Staphylococcus aureus (BCID) DETECTED (A) NOT DETECTED   Staphylococcus epidermidis NOT DETECTED NOT DETECTED   Staphylococcus lugdunensis NOT DETECTED NOT DETECTED   Streptococcus species NOT DETECTED NOT DETECTED   Streptococcus agalactiae NOT DETECTED NOT DETECTED   Streptococcus pneumoniae NOT DETECTED NOT DETECTED   Streptococcus pyogenes NOT DETECTED NOT DETECTED   A.calcoaceticus-baumannii NOT DETECTED NOT DETECTED   Bacteroides fragilis NOT DETECTED NOT DETECTED   Enterobacterales NOT DETECTED NOT DETECTED   Enterobacter cloacae complex NOT DETECTED NOT DETECTED   Escherichia coli NOT DETECTED NOT DETECTED   Klebsiella aerogenes NOT DETECTED NOT DETECTED   Klebsiella oxytoca NOT DETECTED NOT DETECTED   Klebsiella pneumoniae NOT DETECTED NOT DETECTED   Proteus species NOT DETECTED NOT DETECTED   Salmonella species NOT DETECTED NOT DETECTED   Serratia marcescens NOT DETECTED NOT DETECTED   Haemophilus influenzae NOT DETECTED NOT DETECTED    Neisseria meningitidis NOT DETECTED NOT DETECTED   Pseudomonas aeruginosa NOT DETECTED NOT DETECTED   Stenotrophomonas maltophilia NOT DETECTED NOT DETECTED   Candida albicans NOT DETECTED NOT DETECTED   Candida auris NOT DETECTED NOT DETECTED   Candida glabrata NOT DETECTED NOT DETECTED   Candida krusei NOT DETECTED NOT DETECTED   Candida parapsilosis NOT DETECTED NOT DETECTED   Candida tropicalis NOT DETECTED NOT DETECTED   Cryptococcus neoformans/gattii NOT DETECTED NOT DETECTED   Meth resistant mecA/C and MREJ DETECTED (A) NOT DETECTED    Juliette Alcide, PharmD, BCPS, BCIDP Work Cell: [phone] 06/27/2022 11:36 AM

## 2022-06-27 NOTE — Treatment Plan (Addendum)
Aware of consult, MRI ordered and NPO now. Suspect abscess / osteomyelitis. If MRI + for either plan for OR this evening  Sharl Ma, DPM 06/27/2022

## 2022-06-27 NOTE — ED Notes (Signed)
Rounded on patient. Patient supine on stretcher with respirations even and non labored, patient without signs of distress. Patient denies needs at this time.   

## 2022-06-27 NOTE — Consult Note (Signed)
Hospital Consult    Reason for Consult:  Ulceration of right foot, questionable ischemia Requesting Physician:  Dr Lindajo Royal MD MRN #:  [id number]  History of Present Illness: This is a 42 y.o. male  presents to [facility] ER with medical history significant for STEMI s/p RCA stent 2014, hypertension, recurrent strokes, heterozygous prothrombin gene mutation on Eliquis, insulin requiring type 2 diabetes insulin-dependent type 2 diabetes poorly controlled, history of Fournier's gangrene, who presents to the ED with right lower extremity pain most notably in the right calf starting on the day of arrival, without swelling.  He denies any injury.   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   Chicken pox    Coronary artery disease    Inferior ST elevation myocardial infarction in July of 2014. Cardiac catheterization showed 95% distal RCA stenosis and 90% first diagonal stenosis with normal ejection fraction. He underwent PCI in bare-metal stent placement to the distal RCA.   Diabetes mellitus without complication (HCC)    Hypercholesterolemia    Stroke (HCC)    Tobacco use     Past Surgical History:  Procedure Laterality Date   CORONARY ANGIOPLASTY WITH STENT PLACEMENT     DEBRIDEMENT AND CLOSURE WOUND N/A 09/03/2019   Procedure: Debridement and closure of scrotal wound;  Surgeon: Allena Napoleon, MD;  Location: [facility];  Service: Plastics;  Laterality: N/A;   DEBRIDEMENT AND CLOSURE WOUND N/A 07/25/2019   Procedure: DEBRIDEMENT AND CLOSURE WOUND;  Surgeon: Allena Napoleon, MD;  Location: [facility];  Service: Plastics;  Laterality: N/A;   INCISION AND DRAINAGE ABSCESS N/A 07/20/2019   Procedure: INCISION AND DRAINAGE ABSCESS;  Surgeon: Crista Elliot, MD;  Location: [facility];  Service: Urology;  Laterality: N/A;   LEFT HEART CATHETERIZATION WITH CORONARY ANGIOGRAM N/A 08/28/2012    Procedure: LEFT HEART CATHETERIZATION WITH CORONARY ANGIOGRAM;  Surgeon: Pamella Pert, MD;  Location: [facility] C[facility] LAB;  Service: Cardiovascular;  Laterality: N/A;   TEE WITHOUT CARDIOVERSION N/A 09/23/2020   Procedure: TRANSESOPHAGEAL ECHOCARDIOGRAM (TEE);  Surgeon: Lamar Blinks, MD;  Location: [facility];  Service: Cardiovascular;  Laterality: N/A;   WRIST SURGERY  1988    No Known Allergies  Prior to Admission medications   Medication Sig Start Date End Date Taking? Authorizing Provider  acetaminophen (TYLENOL) 500 MG tablet Take 2 tablets (1,000 mg total) by mouth every 6 (six) hours as needed. 01/25/22 01/25/23 Yes Varney Daily, PA  apixaban (ELIQUIS) 5 MG TABS tablet Take 1 tablet (5 mg total) by mouth 2 (two) times daily. 02/10/22  Yes [name], NP  aspirin EC 81 MG tablet Take 1 tablet (81 mg total) by mouth daily. 02/10/22  Yes [name], NP  buPROPion (WELLBUTRIN XL) 150 MG 24 hr tablet Take 1 tablet (150 mg total) by mouth daily. 02/10/22  Yes [name], Salvadore Oxford, NP  insulin glargine (LANTUS SOLOSTAR) 100 UNIT/ML Solostar Pen Inject 16 Units into the skin daily. 05/10/22 08/08/22 Yes [name], Salvadore Oxford, NP  losartan (COZAAR) 25 MG tablet Take 1 tablet (25 mg total) by mouth daily. 05/10/22  Yes [name], Salvadore Oxford, NP  metFORMIN (GLUCOPHAGE) 1000 MG tablet Take 1 tablet (1,000 mg total) by mouth 2 (two) times daily with a meal. TAKE 1 TABLET(1000 MG) BY MOUTH TWICE DAILY WITH A MEAL 02/10/22  Yes [name], Salvadore Oxford, NP  OZEMPIC, 0.25  OR 0.5 MG/DOSE, 2 MG/3ML SOPN Inject 0.5 mg into the skin once a week. Start with 0.25mg  weekly x 4 weeks then increase to 0.5mg  weekly injection. 04/07/22  Yes [name], NP  rosuvastatin (CRESTOR) 20 MG tablet Take 1 tablet (20 mg total) by mouth daily. 02/10/22  Yes [name], NP  sildenafil (VIAGRA) 50 MG tablet Take 1 tablet (50 mg total) by mouth daily as needed for erectile dysfunction. 04/11/22  Yes [name], NP  Accu-Chek  Softclix Lancets lancets Use to check blood sugar three times a day for type 2 diabetes E11.9 03/22/22   [name], NP  glucose blood (ACCU-CHEK GUIDE) test strip Use to check blood sugar three times a day for type 2 diabetes E11.9 03/22/22   [name], NP  HYDROcodone-acetaminophen (NORCO/VICODIN) 5-325 MG tablet Take 1 tablet by mouth every 4 (four) hours as needed for moderate pain. Patient not taking: Reported on 06/26/2022 06/07/22 06/07/23  Cuthriell, Delorise Royals, PA-C  Multiple Vitamin (MULTIVITAMIN WITH MINERALS) TABS tablet Take 1 tablet by mouth daily. One-A-Day Multivitamin Patient not taking: Reported on 06/26/2022 09/28/21   [name], Leana Roe, MD  nicotine (NICODERM CQ - DOSED IN MG/24 HOURS) 21 mg/24hr patch Place 21 mg onto the skin daily.    [provider]    Social History   Socioeconomic History   Marital status: Divorced    Spouse name: Not on file   Number of children: 2   Years of education: Not on file   Highest education level: Not on file  Occupational History   Not on file  Tobacco Use   Smoking status: Every Day    Packs/day: 1.00    Years: 30.00    Additional pack years: 0.00    Total pack years: 30.00    Types: Cigarettes   Smokeless tobacco: Never  Vaping Use   Vaping Use: Never used  Substance and Sexual Activity   Alcohol use: Not Currently    Alcohol/week: 0.0 standard drinks of alcohol    Comment: occasional   Drug use: Not Currently    Types: Marijuana   Sexual activity: Yes    Birth control/protection: None  Other Topics Concern   Not on file  Social History Narrative   Lives with girlfriend and her mother    Social Determinants of Health   Financial Resource Strain: Low Risk  (06/10/2022)   Overall Financial Resource Strain (CARDIA)    Difficulty of Paying Living Expenses: Not very hard  Food Insecurity: No Food Insecurity (06/10/2022)   Hunger Vital Sign    Worried About Running Out of Food in the Last Year: Never true     Ran Out of Food in the Last Year: Never true  Transportation Needs: No Transportation Needs (06/10/2022)   PRAPARE - Administrator, Civil Service (Medical): No    Lack of Transportation (Non-Medical): No  Physical Activity: Not on file  Stress: Not on file  Social Connections: Not on file  Intimate Partner Violence: Not At Risk (06/10/2022)   Humiliation, Afraid, Rape, and Kick questionnaire    Fear of Current or Ex-Partner: No    Emotionally Abused: No    Physically Abused: No    Sexually Abused: No     Family History  Problem Relation Age of Onset   Arthritis Mother        Rheumatoid   Diabetes Mother    Hyperlipidemia Mother    Diabetes Father  Cancer Maternal Grandfather        Prostate   Parkinson's disease Maternal Grandfather     ROS: Otherwise negative unless mentioned in HPI  Physical Examination  Vitals:   06/27/22 0430 06/27/22 0620  BP: (!) 135/91 125/80  Pulse: 96 (!) 106  Resp: 20 20  Temp:  99.5 F (37.5 C)  SpO2: 100% 97%   Body mass index is 29.26 kg/m.  General:  WDWN in NAD Gait: Not observed HENT: WNL, normocephalic Pulmonary: normal non-labored breathing, without Rales, rhonchi,  wheezing Cardiac: regular, without  Murmurs, rubs or gallops; without carotid bruits Abdomen: Positive bowel sounds, soft, NT/ND, no masses Skin: without rashes Vascular Exam/Pulses: Right lower extremity with +2 Edema, Open sore bottom of right foot. Unable to palpate pulses due to edema.  Extremities: with ischemic changes, with Gangrene , with cellulitis; with open wounds;  Musculoskeletal: no muscle wasting or atrophy  Neurologic: A&O X 3;  No focal weakness or paresthesias are detected; speech is fluent/normal Psychiatric:  The pt has Normal affect. Lymph:  Unremarkable  CBC    Component Value Date/Time   WBC 22.2 (H) 06/27/2022 0511   RBC 4.64 06/27/2022 0511   HGB 14.4 06/27/2022 0511   HGB 17.9 (H) 08/11/2021 0937   HCT 42.2  06/27/2022 0511   HCT 51.0 08/11/2021 0937   PLT 263 06/27/2022 0511   PLT 261 08/11/2021 0937   MCV 90.9 06/27/2022 0511   MCV 93 08/11/2021 0937   MCV 93 01/29/2013 0040   MCH 31.0 06/27/2022 0511   MCHC 34.1 06/27/2022 0511   RDW 12.2 06/27/2022 0511   RDW 12.4 08/11/2021 0937   RDW 13.8 01/29/2013 0040   LYMPHSABS 1.9 06/26/2022 1956   LYMPHSABS 4.8 (H) 08/11/2021 0937   MONOABS 1.3 (H) 06/26/2022 1956   EOSABS 0.1 06/26/2022 1956   EOSABS 0.5 (H) 08/11/2021 0937   BASOSABS 0.1 06/26/2022 1956   BASOSABS 0.1 08/11/2021 0937    BMET    Component Value Date/Time   NA 131 (L) 06/27/2022 0511   NA 140 08/11/2021 0937   NA 139 01/29/2013 0040   K 3.4 (L) 06/27/2022 0511   K 4.0 01/29/2013 0040   CL 99 06/27/2022 0511   CL 107 01/29/2013 0040   CO2 21 (L) 06/27/2022 0511   CO2 28 01/29/2013 0040   GLUCOSE 222 (H) 06/27/2022 0511   GLUCOSE 228 (H) 01/29/2013 0040   BUN 9 06/27/2022 0511   BUN 11 08/11/2021 0937   BUN 15 01/29/2013 0040   CREATININE 0.82 06/27/2022 0511   CREATININE 1.03 05/10/2022 1501   CALCIUM 8.0 (L) 06/27/2022 0511   CALCIUM 9.2 01/29/2013 0040   GFRNONAA >60 06/27/2022 0511   GFRNONAA >60 01/29/2013 0040   GFRAA 104 11/13/2019 1550   GFRAA >60 01/29/2013 0040    COAGS: Lab Results  Component Value Date   INR 1.3 (H) 06/27/2022   INR 1.0 09/11/2021   INR 0.9 08/26/2020     Non-Invasive Vascular Imaging:   EXAM: 06/27/22 MRI OF THE RIGHT FOREFOOT WITHOUT AND WITH CONTRAST   TECHNIQUE: Multiplanar, multisequence MR imaging of the right forefoot was performed before and after the administration of intravenous contrast.   CONTRAST:  9mL GADAVIST GADOBUTROL 1 MMOL/ML IV SOLN   COMPARISON:  X-ray 06/26/2022   FINDINGS: Bones/Joint/Cartilage   Bone marrow edema and enhancement with intermediate T1 marrow signal within the second metatarsal head and second toe proximal phalanx. Small-moderate second MTP joint effusion.   Prominent  bone marrow edema with intermediate T1 marrow signal within the base of the third toe proximal phalanx. There is also bone marrow edema and enhancement within the third metatarsal head. Small third MTP joint effusion.   Remaining osseous structures are otherwise intact without significant marrow signal abnormality. No fracture or dislocation.   Ligaments   Intact Lisfranc ligament.  Intact collateral ligaments.   Muscles and Tendons   Flexor tenosynovitis of the second digit within the distal forefoot (series 9, images 16-17). There is also tenosynovitis of the second digit extensor tendon at the level of the second metatarsal neck. Denervation changes of the foot musculature.   Soft tissues   Plantar wound or ulceration underlying the second MTP joint. Extensive surrounding soft tissue edema. Poor enhancement of the soft tissues at this location is concerning for tissue necrosis (series 13, images 15-17). No drainable fluid collection.   IMPRESSION: 1. Plantar wound or ulceration underlying the second MTP joint. Extensive surrounding soft tissue edema compatible with cellulitis. Poor enhancement of the soft tissues at this location is concerning for tissue necrosis. No drainable fluid collection. 2. Acute osteomyelitis of the second metatarsal head and second toe proximal phalanx. Small-moderate second MTP joint effusion is suggestive of septic arthritis. 3. Acute osteomyelitis of the third metatarsal head and base of the third toe proximal phalanx. Small third MTP joint effusion, also concerning for septic arthritis. 4. Flexor tenosynovitis of the second digit within the distal forefoot. There is also tenosynovitis of the second digit extensor tendon at the level of the second metatarsal neck.  Statin:  Yes.   Beta Blocker:  No. Aspirin:  Yes.   ACEI:  No. ARB:  No. CCB use:  No Other antiplatelets/anticoagulants:  Yes.   Eliquis 5 mg Twice Daily   ASSESSMENT/PLAN:  This is a 42 y.o. male who presents to [facility] emergency department with right lower extremity pain.  On workup he was noted to have cellulitis of his right foot with an open foot ulcer.  Workup included MRI which shows osteo myelitis of the second toe and the third toe.  Vascular surgery was consulted to determine blood flow to the right lower extremity.  PLAN: Vascular surgery plans on taking the patient to the vascular lab on 06/29/22 for right lower extremity angiogram with possible intervention.  I discussed in detail with the patient this morning the procedure, benefits, risks, and complications.  Verbalizes understanding.  All the patient's questions this morning.  He wishes to proceed with the procedure.  Patient will be made n.p.o. after midnight on Wednesday, 06/29/2022.   -Discussed the plan in detail with Dr. [name] MD and he agrees with the plan.   Marcie Bal [facility] 06/27/2022 7:27 AM

## 2022-06-27 NOTE — Consult Note (Signed)
NAME: Martin Mullen  DOB: May 06, 1980  MRN: [id number]  Date/Time: 06/27/2022 11:38 AM  REQUESTING PROVIDER: Garrel Ridgel Subjective:  REASON FOR CONSULT: MRSA bacteremia ? Martin Mullen is a 42 y.o. 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 He did not seek care for it Then on Saturday the rt foot started hurting, was swollen and was discolored- he says he also fell in the bar and hurt his hands He does not  drink alcohol but dont know why he was in the bar He walks bare foot at home Has 7 cats Lives with a friend Smoker Denies alcohol,illicit drug use  In the ED vitals showed temp of 102  06/26/22  BP 152/88 !  Temp 102 F (38.9 C) !  Pulse Rate 115 !  Resp 18  SpO2 97 %    Latest Reference Range & Units 06/26/22  WBC 4.0 - 10.5 K/uL 23.7 (H)  Hemoglobin 13.0 - 17.0 g/dL 78.2  HCT 95.6 - 21.3 % 45.5  Platelets 150 - 400 K/uL 321  Creatinine 0.61 - 1.24 mg/dL 0.86  Leucocytosis- blood culture sent He was started on vanco and cefepime for rt foot infection MRI of the rt foot showed acute osteo 2/3 toes, ulceration underlying the 2nd MTP He was taken for surgery today and underwent I/d and 2nd toe ray excision. Bone biopsy of 3rd met head sent I am seeing the patient as Blood culture positive for MRSA  Past Medical History:  Diagnosis Date   Acute transmural inferior wall MI (HCC) 08/28/2012   .5 x 12 mm Veri-flex stent non-DES.   Chicken pox    Coronary artery disease    Inferior ST elevation myocardial infarction in July of 2014. Cardiac catheterization showed 95% distal RCA stenosis and 90% first diagonal stenosis with normal ejection fraction. He underwent PCI in bare-metal stent placement to the distal RCA.    Diabetes mellitus without complication (HCC)    Hypercholesterolemia    Stroke (HCC)    Tobacco use     Past Surgical History:  Procedure Laterality Date   CORONARY ANGIOPLASTY WITH STENT PLACEMENT     DEBRIDEMENT AND CLOSURE WOUND N/A 09/03/2019   Procedure: Debridement and closure of scrotal wound;  Surgeon: Allena Napoleon, MD;  Location: [facility];  Service: Plastics;  Laterality: N/A;   DEBRIDEMENT AND CLOSURE WOUND N/A 07/25/2019   Procedure: DEBRIDEMENT AND CLOSURE WOUND;  Surgeon: Allena Napoleon, MD;  Location: [facility];  Service: Plastics;  Laterality: N/A;   INCISION AND DRAINAGE ABSCESS N/A 07/20/2019   Procedure: INCISION AND DRAINAGE ABSCESS;  Surgeon: Crista Elliot, MD;  Location: [facility];  Service: Urology;  Laterality: N/A;   LEFT HEART CATHETERIZATION WITH CORONARY ANGIOGRAM N/A 08/28/2012   Procedure: LEFT HEART CATHETERIZATION WITH CORONARY ANGIOGRAM;  Surgeon: Pamella Pert, MD;  Location: [facility];  Service: Cardiovascular;  Laterality: N/A;   TEE WITHOUT CARDIOVERSION N/A 09/23/2020   Procedure: TRANSESOPHAGEAL ECHOCARDIOGRAM (TEE);  Surgeon: Lamar Blinks, MD;  Location: [facility];  Service: Cardiovascular;  Laterality: N/A;   WRIST SURGERY  1988    Social History   Socioeconomic History   Marital status: Divorced    Spouse name: Not on  file   Number of children: 2   Years of education: Not on file   Highest education level: Not on file  Occupational History   Not on file  Tobacco Use   Smoking status: Every Day    Packs/day: 1.00    Years: 30.00    Additional pack years: 0.00    Total pack years: 30.00    Types: Cigarettes   Smokeless tobacco: Never  Vaping Use   Vaping Use: Never used  Substance and Sexual Activity   Alcohol use: Not Currently    Alcohol/week: 0.0 standard drinks of alcohol    Comment: occasional   Drug use: Not Currently    Types: Marijuana   Sexual activity: Yes    Birth control/protection: None   Other Topics Concern   Not on file  Social History Narrative   Lives with girlfriend and her mother    Social Determinants of Health   Financial Resource Strain: Low Risk  (06/10/2022)   Overall Financial Resource Strain (CARDIA)    Difficulty of Paying Living Expenses: Not very hard  Food Insecurity: No Food Insecurity (06/10/2022)   Hunger Vital Sign    Worried About Running Out of Food in the Last Year: Never true    Ran Out of Food in the Last Year: Never true  Transportation Needs: No Transportation Needs (06/10/2022)   PRAPARE - Administrator, Civil Service (Medical): No    Lack of Transportation (Non-Medical): No  Physical Activity: Not on file  Stress: Not on file  Social Connections: Not on file  Intimate Partner Violence: Not At Risk (06/10/2022)   Humiliation, Afraid, Rape, and Kick questionnaire    Fear of Current or Ex-Partner: No    Emotionally Abused: No    Physically Abused: No    Sexually Abused: No    Family History  Problem Relation Age of Onset   Arthritis Mother        Rheumatoid   Diabetes Mother    Hyperlipidemia Mother    Diabetes Father    Cancer Maternal Grandfather        Prostate   Parkinson's disease Maternal Grandfather    No Known Allergies I? Current Facility-Administered Medications  Medication Dose Route Frequency Provider Last Rate Last Admin   [MAR Hold] acetaminophen (TYLENOL) tablet 650 mg  650 mg Oral Q6H PRN Andris Baumann, MD   650 mg at 06/26/22 2342   Or   [MAR Hold] acetaminophen (TYLENOL) suppository 650 mg  650 mg Rectal Q6H PRN Andris Baumann, MD       [MAR Hold] apixaban Everlene Balls) tablet 5 mg  5 mg Oral BID Lindajo Royal V, MD   5 mg at 06/27/22 0005   [MAR Hold] aspirin EC tablet 81 mg  81 mg Oral Daily Lindajo Royal V, MD   81 mg at 06/26/22 2312   [MAR Hold] buPROPion (WELLBUTRIN XL) 24 hr tablet 150 mg  150 mg Oral Daily Lindajo Royal V, MD   150 mg at 06/26/22 2312   [MAR Hold] ceFEPIme (MAXIPIME) 2 g in  sodium chloride 0.9 % 100 mL IVPB  2 g Intravenous Q8H Andris Baumann, MD   Stopped at 06/27/22 0536   fentaNYL (SUBLIMAZE) injection 25-50 mcg  25-50 mcg Intravenous Q5 min PRN Stephanie Coup, MD       [facility] Hold] HYDROcodone-acetaminophen (NORCO/VICODIN) 5-325 MG per tablet 1 tablet  1 tablet Oral Q4H PRN Andris Baumann, MD       [  MAR Hold] insulin aspart (novoLOG) injection 0-15 Units  0-15 Units Subcutaneous TID WC Andris Baumann, MD       [MAR Hold] insulin aspart (novoLOG) injection 0-5 Units  0-5 Units Subcutaneous QHS Andris Baumann, MD   3 Units at 06/26/22 2339   [facility] Hold] insulin glargine-yfgn (SEMGLEE) injection 16 Units  16 Units Subcutaneous Daily Andris Baumann, MD   16 Units at 06/27/22 0124   lactated ringers infusion  150 mL/hr Intravenous Continuous Andris Baumann, MD   Paused at 06/27/22 0012   [MAR Hold] losartan (COZAAR) tablet 25 mg  25 mg Oral Daily Andris Baumann, MD   25 mg at 06/26/22 2312   [MAR Hold] nicotine (NICODERM CQ - dosed in mg/24 hours) patch 21 mg  21 mg Transdermal Daily Foust, Katy L, NP   21 mg at 06/27/22 0610   [MAR Hold] nicotine polacrilex (NICORETTE) gum 2 mg  2 mg Oral PRN Foust, Lanney Gins, NP       [MAR Hold] ondansetron (ZOFRAN) tablet 4 mg  4 mg Oral Q6H PRN Andris Baumann, MD       Or   [facility] Hold] ondansetron [facility]) injection 4 mg  4 mg Intravenous Q6H PRN Andris Baumann, MD       oxyCODONE (Oxy IR/ROXICODONE) immediate release tablet 5 mg  5 mg Oral Once PRN Stephanie Coup, MD       Or   oxyCODONE (ROXICODONE) 5 MG/5ML solution 5 mg  5 mg Oral Once PRN Stephanie Coup, MD       [facility] Hold] rosuvastatin (CRESTOR) tablet 20 mg  20 mg Oral Daily Lindajo Royal V, MD   20 mg at 06/26/22 2314   [MAR Hold] vancomycin (VANCOREADY) IVPB 1500 mg/300 mL  1,500 mg Intravenous Q12H Andris Baumann, MD         Abtx:  Anti-infectives (From admission, onward)    Start     Dose/Rate Route Frequency Ordered Stop   06/27/22 1000  vancomycin  (VANCOREADY) IVPB 1500 mg/300 mL  Status:  Discontinued        1,500 mg 150 mL/hr over 120 Minutes Intravenous Every 12 hours 06/26/22 2227 06/26/22 2249   06/27/22 1000  [MAR Hold]  vancomycin (VANCOREADY) IVPB 1500 mg/300 mL        (MAR Hold since Mon 06/27/2022 at 0818.Hold Reason: Transfer to a Procedural area)   1,500 mg 150 mL/hr over 120 Minutes Intravenous Every 12 hours 06/26/22 2249 07/04/22 0959   06/27/22 0500  ceFEPIme (MAXIPIME) 2 g in sodium chloride 0.9 % 100 mL IVPB  Status:  Discontinued        2 g 200 mL/hr over 30 Minutes Intravenous Every 8 hours 06/26/22 2225 06/26/22 2249   06/27/22 0500  [MAR Hold]  ceFEPIme (MAXIPIME) 2 g in sodium chloride 0.9 % 100 mL IVPB        (MAR Hold since Mon 06/27/2022 at 0818.Hold Reason: Transfer to a Procedural area)   2 g 200 mL/hr over 30 Minutes Intravenous Every 8 hours 06/26/22 2249 07/04/22 0459   06/26/22 2230  vancomycin (VANCOREADY) IVPB 1250 mg/250 mL        1,250 mg 166.7 mL/hr over 90 Minutes Intravenous  Once 06/26/22 2223 06/27/22 0147   06/26/22 2030  vancomycin (VANCOCIN) IVPB 1000 mg/200 mL premix        1,000 mg 200 mL/hr over 60 Minutes Intravenous  Once 06/26/22 2026 06/26/22 2304   06/26/22 2030  ceFEPIme (MAXIPIME) 2 g in sodium chloride 0.9 % 100 mL IVPB        2 g 200 mL/hr over 30 Minutes Intravenous  Once 06/26/22 2026 06/26/22 2139       REVIEW OF SYSTEMS:  Const:  fever,  chills, negative weight loss Eyes: negative diplopia or visual changes, negative eye pain ENT: negative coryza, negative sore throat Resp: negative cough, hemoptysis, dyspnea Cards: negative for chest pain, palpitations, lower extremity edema GU: negative for frequency, dysuria and hematuria GI: Negative for abdominal pain, diarrhea, bleeding, constipation Skin: negative for rash and pruritus Heme: negative for easy bruising and gum/nose bleeding MS: rt foot pain /swelling Neurolo:neuropathy feet Psych:  anxiety, depression   Endocrine:  diabetes Allergy/Immunology- negative for any medication or food allergies ?  Objective:  VITALS:  BP 109/75 (BP Location: Left Arm)   Pulse (!) 101   Temp (!) 97.4 F (36.3 C)   Resp 16   Wt 92.5 kg   SpO2 96%   BMI 29.26 kg/m   PHYSICAL EXAM:  General: Alert, cooperative, no distress, appears stated age. Ill looking Head: Normocephalic, without obvious abnormality, atraumatic. Eyes: Conjunctivae clear, anicteric sclerae. Pupils are equal ENT Nares normal. No drainage or sinus tenderness. Lips, mucosa, and tongue normal. No Thrush Neck: Supple, symmetrical, no adenopathy, thyroid: non tender no carotid bruit and no JVD. Back: No CVA tenderness. Lungs: Clear to auscultation bilaterally. No Wheezing or Rhonchi. No rales. Heart: Regular rate and rhythm, no murmur, rub or gallop. Abdomen: Soft, non-tender,not distended. Bowel sounds normal. No masses Extremities: rt foot surgical dressing Picture taken in the ED reviewed     Skin: No rashes or lesions. Or bruising Lymph: Cervical, supraclavicular normal. Neurologic: Grossly non-focal Pertinent Labs Lab Results CBC    Component Value Date/Time   WBC 22.2 (H) 06/27/2022 0511   RBC 4.64 06/27/2022 0511   HGB 14.4 06/27/2022 0511   HGB 17.9 (H) 08/11/2021 0937   HCT 42.2 06/27/2022 0511   HCT 51.0 08/11/2021 0937   PLT 263 06/27/2022 0511   PLT 261 08/11/2021 0937   MCV 90.9 06/27/2022 0511   MCV 93 08/11/2021 0937   MCV 93 01/29/2013 0040   MCH 31.0 06/27/2022 0511   MCHC 34.1 06/27/2022 0511   RDW 12.2 06/27/2022 0511   RDW 12.4 08/11/2021 0937   RDW 13.8 01/29/2013 0040   LYMPHSABS 1.9 06/26/2022 1956   LYMPHSABS 4.8 (H) 08/11/2021 0937   MONOABS 1.3 (H) 06/26/2022 1956   EOSABS 0.1 06/26/2022 1956   EOSABS 0.5 (H) 08/11/2021 0937   BASOSABS 0.1 06/26/2022 1956   BASOSABS 0.1 08/11/2021 0937       Latest Ref Rng & Units 06/27/2022    5:11 AM 06/26/2022    7:56 PM 05/10/2022    3:01 PM   CMP  Glucose 70 - 99 mg/dL 528  413  244   BUN 6 - 20 mg/dL 9  10  13    Creatinine 0.61 - 1.24 mg/dL 0.10  2.72  5.36   Sodium 135 - 145 mmol/L 131  133  141   Potassium 3.5 - 5.1 mmol/L 3.4  3.4  4.4   Chloride 98 - 111 mmol/L 99  95  101   CO2 22 - 32 mmol/L 21  27  28    Calcium 8.9 - 10.3 mg/dL 8.0  8.8  64.4   Total Protein 6.5 - 8.1 g/dL  7.8  7.1   Total Bilirubin 0.3 - 1.2 mg/dL  1.0  0.4   Alkaline Phos 38 - 126 U/L  125    AST 15 - 41 U/L  13  8   ALT 0 - 44 U/L  8  9       Microbiology: Recent Results (from the past 240 hour(s))  Blood culture (single)     Status: None (Preliminary result)   Collection Time: 06/26/22  7:56 PM   Specimen: BLOOD  Result Value Ref Range Status   Specimen Description BLOOD RIGHT ANTECUBITAL  Final   Special Requests   Final    BOTTLES DRAWN AEROBIC AND ANAEROBIC Blood Culture adequate volume   Culture  Setup Time   Final    GRAM POSITIVE COCCI AEROBIC BOTTLE ONLY Organism ID to follow CRITICAL RESULT CALLED TO, READ BACK BY AND VERIFIED WITH: [facility] MITCHELL 06/27/2022 1124 CP Performed at [facility], [address] [place], [place] [address]    Culture GRAM POSITIVE COCCI  Final   Report Status PENDING  Incomplete  Blood Culture ID Panel (Reflexed)     Status: Abnormal   Collection Time: 06/26/22  7:56 PM  Result Value Ref Range Status   Enterococcus faecalis NOT DETECTED NOT DETECTED Final   Enterococcus Faecium NOT DETECTED NOT DETECTED Final   Listeria monocytogenes NOT DETECTED NOT DETECTED Final   Staphylococcus species DETECTED (A) NOT DETECTED Final    Comment: CRITICAL RESULT CALLED TO, READ BACK BY AND VERIFIED WITH: DEVAN MITCHELL 06/27/2022 1124 CP    Staphylococcus aureus (BCID) DETECTED (A) NOT DETECTED Final    Comment: Methicillin (oxacillin)-resistant Staphylococcus aureus (MRSA). MRSA is predictably resistant to beta-lactam antibiotics (except ceftaroline). Preferred therapy is vancomycin unless  clinically contraindicated. Patient requires contact precautions if  hospitalized. CRITICAL RESULT CALLED TO, READ BACK BY AND VERIFIED WITH: DEVAN MITCHELL 06/27/2022 1124 CP    Staphylococcus epidermidis NOT DETECTED NOT DETECTED Final   Staphylococcus lugdunensis NOT DETECTED NOT DETECTED Final   Streptococcus species NOT DETECTED NOT DETECTED Final   Streptococcus agalactiae NOT DETECTED NOT DETECTED Final   Streptococcus pneumoniae NOT DETECTED NOT DETECTED Final   Streptococcus pyogenes NOT DETECTED NOT DETECTED Final   A.calcoaceticus-baumannii NOT DETECTED NOT DETECTED Final   Bacteroides fragilis NOT DETECTED NOT DETECTED Final   Enterobacterales NOT DETECTED NOT DETECTED Final   Enterobacter cloacae complex NOT DETECTED NOT DETECTED Final   Escherichia coli NOT DETECTED NOT DETECTED Final   Klebsiella aerogenes NOT DETECTED NOT DETECTED Final   Klebsiella oxytoca NOT DETECTED NOT DETECTED Final   Klebsiella pneumoniae NOT DETECTED NOT DETECTED Final   Proteus species NOT DETECTED NOT DETECTED Final   Salmonella species NOT DETECTED NOT DETECTED Final   Serratia marcescens NOT DETECTED NOT DETECTED Final   Haemophilus influenzae NOT DETECTED NOT DETECTED Final   Neisseria meningitidis NOT DETECTED NOT DETECTED Final   Pseudomonas aeruginosa NOT DETECTED NOT DETECTED Final   Stenotrophomonas maltophilia NOT DETECTED NOT DETECTED Final   Candida albicans NOT DETECTED NOT DETECTED Final   Candida auris NOT DETECTED NOT DETECTED Final   Candida glabrata NOT DETECTED NOT DETECTED Final   Candida krusei NOT DETECTED NOT DETECTED Final   Candida parapsilosis NOT DETECTED NOT DETECTED Final   Candida tropicalis NOT DETECTED NOT DETECTED Final   Cryptococcus neoformans/gattii NOT DETECTED NOT DETECTED Final   Meth resistant mecA/C and MREJ DETECTED (A) NOT DETECTED Final    Comment: CRITICAL RESULT CALLED TO, READ BACK BY AND VERIFIED WITH: [facility] MITCHELL 06/27/2022 1124  CP Performed at [facility],  [address] [place], [place] [address]   Blood culture (single)     Status: None (Preliminary result)   Collection Time: 06/26/22  8:04 PM   Specimen: BLOOD  Result Value Ref Range Status   Specimen Description BLOOD LEFT ANTECUBITAL  Final   Special Requests   Final    BOTTLES DRAWN AEROBIC AND ANAEROBIC Blood Culture adequate volume   Culture   Final    NO GROWTH < 12 HOURS Performed at [facility], [address] [place], [place] [address]    Report Status PENDING  Incomplete    IMAGING RESULTS: I have personally reviewed the films ? Impression/Recommendation ? ?MRSA bacteremia- source is the foot He will need 2 d echo/TEE  Rt foot infection- 2nd toe abscess/osteo Wound plantar surface S/p I/D , 2nd toe ray excision Sent for culture and pathology Will need long term antibiotic- would avoid IV as poor social support and patient prefers oral antibiotic as well If no endocarditis then may consider after 1-2 weeks IV to switch to Po Pt currently on vanco and cefepime If abscess culture no gram neg rods then can stop cefepime  Diabetes mellitus with peripheral neuropathy with DFI Callus left foot On metofromin/insulin  HTN  CAD s/p stent Recurrent CVA due to prothrombin gene mutation on eliquis  Current smoker  ? ___________________________________________________ Discussed with patient, requesting provider Note:  This document was prepared using Dragon voice recognition software and may include unintentional dictation errors.

## 2022-06-27 NOTE — Interval H&P Note (Signed)
History and Physical Interval Note:  06/27/2022 7:33 AM  Martin Mullen  has presented today for surgery, with the diagnosis of Right Washout Incision and Drainage.  The various methods of treatment have been discussed with the patient and family. After consideration of risks, benefits and other options for treatment, the patient has consented to  Procedure(s): INCISION AND DRAINAGE (Right) as a surgical intervention.  The patient's history has been reviewed, patient examined, no change in status, stable for surgery.  I have reviewed the patient's chart and labs.  Questions were answered to the patient's satisfaction.     Candelaria Stagers

## 2022-06-27 NOTE — Anesthesia Procedure Notes (Signed)
Procedure Name: Intubation Date/Time: 06/27/2022 8:53 AM  Performed by: Jaye Beagle, CRNAPre-anesthesia Checklist: Patient identified, Emergency Drugs available, Suction available and Patient being monitored Patient Re-evaluated:Patient Re-evaluated prior to induction Oxygen Delivery Method: Circle system utilized Preoxygenation: Pre-oxygenation with 100% oxygen Induction Type: IV induction Laryngoscope Size: McGraph and 4 Grade View: Grade I Tube type: Oral Tube size: 7.5 mm Number of attempts: 1 Airway Equipment and Method: Stylet and Oral airway Placement Confirmation: ETT inserted through vocal cords under direct vision, positive ETCO2 and breath sounds checked- equal and bilateral Secured at: 22 cm Tube secured with: Tape Dental Injury: Teeth and Oropharynx as per pre-operative assessment

## 2022-06-27 NOTE — Consult Note (Signed)
  Subjective:  Patient ID: Martin Mullen, male    DOB: 10-17-1980,  MRN: [id number]  A 42 y.o. male presents withwith medical history significant for STEMI s/p RCA stent 2014, hypertension, recurrent strokes, heterozygous prothrombin gene mutation on Eliquis, insulin requiring type 2 diabetes insulin-dependent type 2 diabetes poorly controlled, history of Fournier's gangrene, who presents to the ED with right lower extremity pain most notably in the right calf starting on the day of arrival, without swelling.  He denies any injury.  Objective:   Vitals:   06/27/22 0430 06/27/22 0620  BP: (!) 135/91 125/80  Pulse: 96 (!) 106  Resp: 20 20  Temp:  99.5 F (37.5 C)  SpO2: 100% 97%   General AA&O x3. Normal mood and affect.  Vascular Dorsalis pedis and posterior tibial pulses nonpalpable Brisk capillary refill to all digits. Pedal hair not present present.  Neurologic Epicritic sensation grossly intact.  Dermatologic Right submetatarsal 2 ulceration probe down to deep bone/tissue.  Some purulent drainage expressed.  Cellulitis up to the mid leg.  Malodor present.  Cyanotic color to the second toe.  Orthopedic: MMT 5/5 in dorsiflexion, plantarflexion, inversion, and eversion. Normal joint ROM without pain or crepitus.      1. Plantar wound or ulceration underlying the second MTP joint. Extensive surrounding soft tissue edema compatible with cellulitis. Poor enhancement of the soft tissues at this location is concerning for tissue necrosis. No drainable fluid collection. 2. Acute osteomyelitis of the second metatarsal head and second toe proximal phalanx. Small-moderate second MTP joint effusion is suggestive of septic arthritis. 3. Acute osteomyelitis of the third metatarsal head and base of the third toe proximal phalanx. Small third MTP joint effusion, also concerning for septic arthritis. 4. Flexor tenosynovitis of the second digit within the distal forefoot. There is also  tenosynovitis of the second digit extensor tendon at the level of the second metatarsal neck. Assessment & Plan:  Patient was evaluated and treated and all questions answered.  Right foot diabetic foot infection with underlying osteomyelitis the second possibly the third -All questions and concerns were discussed with the patient in extensive detail -Given the amount of leukocytosis is present in the setting of diabetic foot ulcer with cellulitis I believe patient will benefit from incision drainage washout debridement.  For now the goal of the surgery is for infection control. -We will await for an MRI to assess for possible amputation and will bring him back on Wednesday for further incision drainage washout debridement with possible amputation -Plan to take the patient to the OR for incision drainage washout debridement with partial second ray amputation with bone biopsy of third metatarsal head.   -N.p.o. after midnight -Local wound care -Partial weightbearing to heel -Await vascular input  Candelaria Stagers, DPM  Accessible via secure chat for questions or concerns.

## 2022-06-27 NOTE — ED Notes (Signed)
Pt to MRI at this time.

## 2022-06-27 NOTE — Anesthesia Postprocedure Evaluation (Signed)
Anesthesia Post Note  Patient: Martin Mullen  Procedure(s) Performed: INCISION AND DRAINAGE (Right) AMPUTATION TOE RIGHT SECOND TOE (Right: Toe)  Patient location during evaluation: PACU Anesthesia Type: General Level of consciousness: awake and alert Pain management: pain level controlled Vital Signs Assessment: post-procedure vital signs reviewed and stable Respiratory status: spontaneous breathing, nonlabored ventilation, respiratory function stable and patient connected to nasal cannula oxygen Cardiovascular status: blood pressure returned to baseline and stable Postop Assessment: no apparent nausea or vomiting Anesthetic complications: no  No notable events documented.   Last Vitals:  Vitals:   06/27/22 0930 06/27/22 0945  BP: 102/66 92/64  Pulse: 98 94  Resp: 15 11  Temp:    SpO2: 98% 99%    Last Pain:  Vitals:   06/27/22 0925  TempSrc:   PainSc: Asleep                 Stephanie Coup

## 2022-06-28 ENCOUNTER — Encounter: Payer: Self-pay | Admitting: Podiatry

## 2022-06-28 ENCOUNTER — Inpatient Hospital Stay (HOSPITAL_COMMUNITY)
Admit: 2022-06-28 | Discharge: 2022-06-28 | Disposition: A | Discharge status: Home / Self Care | Payer: Medicaid Other | Attending: Infectious Diseases | Admitting: Infectious Diseases

## 2022-06-28 DIAGNOSIS — R7881 Bacteremia: Secondary | ICD-10-CM | POA: Diagnosis not present

## 2022-06-28 DIAGNOSIS — L97419 Non-pressure chronic ulcer of right heel and midfoot with unspecified severity: Secondary | ICD-10-CM

## 2022-06-28 DIAGNOSIS — D6852 Prothrombin gene mutation: Secondary | ICD-10-CM | POA: Diagnosis not present

## 2022-06-28 DIAGNOSIS — E11621 Type 2 diabetes mellitus with foot ulcer: Secondary | ICD-10-CM | POA: Diagnosis not present

## 2022-06-28 DIAGNOSIS — A4102 Sepsis due to Methicillin resistant Staphylococcus aureus: Secondary | ICD-10-CM | POA: Diagnosis not present

## 2022-06-28 DIAGNOSIS — L03115 Cellulitis of right lower limb: Secondary | ICD-10-CM | POA: Diagnosis not present

## 2022-06-28 DIAGNOSIS — I2510 Atherosclerotic heart disease of native coronary artery without angina pectoris: Secondary | ICD-10-CM | POA: Diagnosis not present

## 2022-06-28 DIAGNOSIS — Z9861 Coronary angioplasty status: Secondary | ICD-10-CM

## 2022-06-28 DIAGNOSIS — B9562 Methicillin resistant Staphylococcus aureus infection as the cause of diseases classified elsewhere: Secondary | ICD-10-CM

## 2022-06-28 LAB — BLOOD CULTURE ID PANEL (REFLEXED) - BCID2

## 2022-06-28 LAB — CBC WITH DIFFERENTIAL/PLATELET
Abs Immature Granulocytes: 0.07 10*3/uL (ref 0.00–0.07)
Basophils Absolute: 0.1 10*3/uL (ref 0.0–0.1)
Basophils Relative: 1 %
Eosinophils Absolute: 0.4 10*3/uL (ref 0.0–0.5)
Eosinophils Relative: 2 %
HCT: 44.3 % (ref 39.0–52.0)
Hemoglobin: 14.9 g/dL (ref 13.0–17.0)
Immature Granulocytes: 0 %
Lymphocytes Relative: 14 %
Lymphs Abs: 2.4 10*3/uL (ref 0.7–4.0)
MCH: 30.5 pg (ref 26.0–34.0)
MCHC: 33.6 g/dL (ref 30.0–36.0)
MCV: 90.8 fL (ref 80.0–100.0)
Monocytes Absolute: 1.2 10*3/uL — ABNORMAL HIGH (ref 0.1–1.0)
Monocytes Relative: 7 %
Neutro Abs: 12.9 10*3/uL — ABNORMAL HIGH (ref 1.7–7.7)
Neutrophils Relative %: 76 %
Platelets: 277 10*3/uL (ref 150–400)
RBC: 4.88 MIL/uL (ref 4.22–5.81)
RDW: 12.3 % (ref 11.5–15.5)
WBC: 17.1 10*3/uL — ABNORMAL HIGH (ref 4.0–10.5)
nRBC: 0 % (ref 0.0–0.2)

## 2022-06-28 LAB — PROTIME-INR
INR: 1.2 (ref 0.8–1.2)
Prothrombin Time: 15.8 seconds — ABNORMAL HIGH (ref 11.4–15.2)

## 2022-06-28 LAB — URINE DRUG SCREEN, QUALITATIVE (ARMC ONLY)
Amphetamines, Ur Screen: NOT DETECTED
Barbiturates, Ur Screen: NOT DETECTED
Benzodiazepine, Ur Scrn: NOT DETECTED
Cannabinoid 50 Ng, Ur ~~LOC~~: POSITIVE — AB
Cocaine Metabolite,Ur ~~LOC~~: NOT DETECTED
MDMA (Ecstasy)Ur Screen: NOT DETECTED
Methadone Scn, Ur: NOT DETECTED
Opiate, Ur Screen: POSITIVE — AB
Phencyclidine (PCP) Ur S: NOT DETECTED
Tricyclic, Ur Screen: NOT DETECTED

## 2022-06-28 LAB — BASIC METABOLIC PANEL
Anion gap: 10 (ref 5–15)
BUN: 8 mg/dL (ref 6–20)
CO2: 24 mmol/L (ref 22–32)
Calcium: 8.4 mg/dL — ABNORMAL LOW (ref 8.9–10.3)
Chloride: 101 mmol/L (ref 98–111)
Creatinine, Ser: 0.87 mg/dL (ref 0.61–1.24)
GFR, Estimated: >60 mL/min (ref >60)
Glucose, Bld: 153 mg/dL — ABNORMAL HIGH (ref 70–99)
Potassium: 3.6 mmol/L (ref 3.5–5.1)
Sodium: 135 mmol/L (ref 135–145)

## 2022-06-28 LAB — GLUCOSE, CAPILLARY
Glucose-Capillary: 157 mg/dL — ABNORMAL HIGH (ref 70–99)
Glucose-Capillary: 117 mg/dL — ABNORMAL HIGH (ref 70–99)
Glucose-Capillary: 188 mg/dL — ABNORMAL HIGH (ref 70–99)
Glucose-Capillary: 188 mg/dL — ABNORMAL HIGH (ref 70–99)

## 2022-06-28 LAB — APTT
aPTT: 32 seconds (ref 24–36)
aPTT: 32 seconds (ref 24–36)

## 2022-06-28 LAB — AEROBIC/ANAEROBIC CULTURE W GRAM STAIN (SURGICAL/DEEP WOUND)

## 2022-06-28 LAB — MAGNESIUM: Magnesium: 1.8 mg/dL (ref 1.7–2.4)

## 2022-06-28 MED ORDER — CHLORHEXIDINE GLUCONATE CLOTH 2 % EX PADS
6.0000 | MEDICATED_PAD | Freq: Once | CUTANEOUS | Status: AC
  Administered 2022-06-28: 6 via TOPICAL

## 2022-06-28 MED ORDER — DIPHENHYDRAMINE HCL 50 MG/ML IJ SOLN: 50.0000 mg | Freq: Once | INTRAMUSCULAR | Status: DC | PRN

## 2022-06-28 MED ORDER — HEPARIN BOLUS VIA INFUSION
2700.0000 [IU] | Freq: Once | INTRAVENOUS | Status: AC
  Administered 2022-06-28: 2700 [IU] via INTRAVENOUS
  Filled 2022-06-28: qty 2700

## 2022-06-28 MED ORDER — FENTANYL CITRATE PF 50 MCG/ML IJ SOSY: 12.5000 ug | PREFILLED_SYRINGE | Freq: Once | INTRAMUSCULAR | Status: DC | PRN

## 2022-06-28 MED ORDER — METHYLPREDNISOLONE SODIUM SUCC 125 MG IJ SOLR: 125.0000 mg | Freq: Once | INTRAMUSCULAR | Status: DC | PRN

## 2022-06-28 MED ORDER — ONDANSETRON HCL 4 MG/2ML IJ SOLN
4.0000 mg | Freq: Four times a day (QID) | INTRAMUSCULAR | Status: DC | PRN
  Administered 2022-06-29: 4 mg via INTRAVENOUS

## 2022-06-28 MED ORDER — MIDAZOLAM HCL 2 MG/ML PO SYRP: 8.0000 mg | ORAL_SOLUTION | Freq: Once | ORAL | Status: DC | PRN

## 2022-06-28 MED ORDER — SODIUM CHLORIDE 0.9 % IV SOLN: INTRAVENOUS | Status: DC

## 2022-06-28 MED ORDER — HYDROMORPHONE HCL 1 MG/ML IJ SOLN: 1.0000 mg | Freq: Once | INTRAMUSCULAR | Status: DC | PRN

## 2022-06-28 MED ORDER — FAMOTIDINE 20 MG PO TABS: 40.0000 mg | ORAL_TABLET | Freq: Once | ORAL | Status: DC | PRN

## 2022-06-28 MED ORDER — HEPARIN BOLUS VIA INFUSION
4000.0000 [IU] | Freq: Once | INTRAVENOUS | Status: AC
  Administered 2022-06-28: 4000 [IU] via INTRAVENOUS
  Filled 2022-06-28: qty 4000

## 2022-06-28 MED ORDER — HEPARIN (PORCINE) 25000 UT/250ML-% IV SOLN
2600.0000 [IU]/h | INTRAVENOUS | Status: DC
  Administered 2022-06-28: 1200 [IU]/h via INTRAVENOUS
  Administered 2022-06-29: 1550 [IU]/h via INTRAVENOUS
  Administered 2022-06-30: 2400 [IU]/h via INTRAVENOUS
  Administered 2022-07-01 – 2022-07-02 (×2): 2600 [IU]/h via INTRAVENOUS
  Filled 2022-06-28 (×8): qty 250

## 2022-06-28 MED ORDER — CEFAZOLIN SODIUM-DEXTROSE 2-4 GM/100ML-% IV SOLN: 2.0000 g | INTRAVENOUS | Status: DC

## 2022-06-28 NOTE — Progress Notes (Addendum)
Progress Note    Martin Mullen  ZOX:[id number] DOB: 16-Oct-1980  DOA: 06/26/2022 PCP: [name], NP      Brief Narrative:    Medical records reviewed and are as summarized below:  Martin Mullen is a 42 y.o. 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  He presented to the hospital because of pain in the right leg.   He was admitted to the hospital for sepsis secondary to MRSA bacteremia, right foot cellulitis,  acute osteomyelitis of right second metatarsal head and second toe proximal phalanx, septic arthritis of right second MTP joint and right diabetic foot ulcer.  He was treated with empiric IV antibiotics.   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   Sepsis secondary to MRSA bacteremia, right foot cellulitis, acute osteomyelitis of right second metatarsal head and second toe proximal phalanx, septic arthritis of right second MTP joint, right diabetic foot ulcer: S/p washout I&D of right foot infection on 06/27/2022.  Plan for right lower extremity angiogram tomorrow. Leukocytosis is  improving.  Continue IV vancomycin and IV cefepime.  Follow-up with ID, podiatrist and vascular surgeon.     Hypokalemia and hyponatremia: Improved   CAD with bare-metal stent to distal RCA in 2014, PVD: Continue aspirin, Lipitor   Heterozygous for prothrombin gene 20210A mutation, recurrent strokes: Eliquis has been held and he has been started on IV heparin drip.  Monitor heparin level per protocol.   Type II DM with hyperglycemia: Continue insulin glargine and NovoLog as needed for hyperglycemia.  Hemoglobin A1c about a month prior to admission was 8.2.   Other comorbidities include hypertension, hyperlipidemia, history of Fournier's gangrene   Diet Order             Diet NPO time specified  Diet effective midnight           Diet Carb Modified Fluid consistency: Thin; Room service appropriate? Yes  Diet effective now                            Consultants: Podiatrist  Procedures: S/p washout and I&D of right foot infection    Medications:    aspirin EC  81 mg Oral Daily   buPROPion  150 mg Oral Daily   insulin aspart  0-15 Units Subcutaneous TID WC   insulin aspart  0-5 Units Subcutaneous QHS   insulin glargine-yfgn  16 Units Subcutaneous Daily   losartan  25 mg Oral Daily   nicotine  21 mg Transdermal Daily   polyethylene glycol  17 g Oral Daily   rosuvastatin  20  mg Oral Daily   Continuous Infusions:  sodium chloride 75 mL/hr at 06/28/22 1310    ceFAZolin (ANCEF) IV     ceFEPime (MAXIPIME) IV 2 g (06/28/22 0507)   heparin 1,200 Units/hr (06/28/22 1331)   vancomycin 1,500 mg (06/28/22 1301)     Anti-infectives (From admission, onward)    Start     Dose/Rate Route Frequency Ordered Stop   06/28/22 0803  ceFAZolin (ANCEF) IVPB 2g/100 mL premix        2 g 200 mL/hr over 30 Minutes Intravenous 30 min pre-op 06/28/22 0803     06/27/22 1000  vancomycin (VANCOREADY) IVPB 1500 mg/300 mL  Status:  Discontinued        1,500 mg 150 mL/hr over 120  Minutes Intravenous Every 12 hours 06/26/22 2227 06/26/22 2249   06/27/22 1000  vancomycin (VANCOREADY) IVPB 1500 mg/300 mL        1,500 mg 150 mL/hr over 120 Minutes Intravenous Every 12 hours 06/26/22 2249 07/04/22 0959   06/27/22 0500  ceFEPIme (MAXIPIME) 2 g in sodium chloride 0.9 % 100 mL IVPB  Status:  Discontinued        2 g 200 mL/hr over 30 Minutes Intravenous Every 8 hours 06/26/22 2225 06/26/22 2249   06/27/22 0500  ceFEPIme (MAXIPIME) 2 g in sodium chloride 0.9 % 100 mL IVPB        2 g 200 mL/hr over 30 Minutes Intravenous Every 8 hours 06/26/22 2249 07/04/22 0459   06/26/22 2230  vancomycin (VANCOREADY) IVPB 1250 mg/250 mL        1,250 mg 166.7 mL/hr over 90 Minutes Intravenous  Once 06/26/22 2223 06/27/22 0147   06/26/22 2030  vancomycin (VANCOCIN) IVPB 1000 mg/200 mL premix        1,000 mg 200 mL/hr over 60 Minutes Intravenous  Once 06/26/22 2026 06/26/22 2304   06/26/22 2030  ceFEPIme (MAXIPIME) 2 g in sodium chloride 0.9 % 100 mL IVPB        2 g 200 mL/hr over 30 Minutes Intravenous  Once 06/26/22 2026 06/26/22 2139              Family Communication/Anticipated D/C date and plan/Code Status   DVT prophylaxis:      Code Status: Full Code  Family Communication: None Disposition Plan: Plan to discharge home 4 to 5 days   Status is: Inpatient Remains inpatient appropriate because: Sepsis on IV antibiotics       Subjective:   Interval events noted.  No pain in the right foot.  He feels better.  Objective:    Vitals:   06/27/22 2039 06/27/22 2100 06/28/22 0506 06/28/22 0734  BP: 115/81  (!) 147/97 135/83  Pulse: (!) 108 98 (!) 110 (!) 105  Resp: 20  20 18   Temp: 98.4 F (36.9 C)  99.3 F (37.4 C) 98.1 F (36.7 C)  TempSrc:    Oral  SpO2: 94%  95% 94%  Weight:      Height:       No data found.   Intake/Output Summary (Last 24 hours) at 06/28/2022 1429 Last data filed at 06/28/2022 0700 Gross per 24 hour  Intake 1000 ml  Output 950  ml  Net 50 ml   Filed Weights   06/26/22 1838  Weight: 92.5 kg    Exam:  GEN: NAD SKIN: Warm and dry EYES: No pallor or icterus ENT: MMM CV: RRR PULM: CTA B ABD: soft, ND, NT, +BS CNS: AAO x 3, non focal  EXT: Right leg edema. Dressing on right foot is clean, dry and intact        Data Reviewed:   I have personally reviewed following labs and imaging studies:  Labs: Labs show the following:   Basic Metabolic Panel: Recent Labs  Lab 06/26/22 1956 06/27/22 0511 06/28/22 0559  NA 133* 131* 135  K 3.4* 3.4* 3.6  CL 95* 99 101  CO2 27 21* 24  GLUCOSE 293* 222* 153*  BUN 10 9 8   CREATININE 0.94 0.82 0.87  CALCIUM 8.8* 8.0* 8.4*  MG  --   --  1.8   GFR Estimated Creatinine Clearance: 126.4 mL/min (by C-G formula based on SCr of 0.87 mg/dL). Liver Function Tests: Recent Labs  Lab 06/26/22 1956  AST 13*  ALT 8  ALKPHOS 125  BILITOT 1.0  PROT 7.8  ALBUMIN 3.4*   No results for input(s): "LIPASE", "AMYLASE" in the last 168 hours. No results for input(s): "AMMONIA" in the last 168 hours. Coagulation profile Recent Labs  Lab 06/27/22 0511 06/28/22 1116  INR 1.3* 1.2    CBC: Recent Labs  Lab 06/26/22 1956 06/27/22 0511 06/28/22 0559  WBC 23.7* 22.2* 17.1*  NEUTROABS 20.2*  --  12.9*  HGB 15.6 14.4 14.9  HCT 45.5 42.2 44.3  MCV 89.9 90.9 90.8  PLT 321 263 277   Cardiac Enzymes: No results for input(s): "CKTOTAL", "CKMB", "CKMBINDEX", "TROPONINI" in the last 168 hours. BNP (last 3 results) No results for input(s): "PROBNP" in the last 8760 hours. CBG: Recent Labs  Lab 06/27/22 0925 06/27/22 1530 06/27/22 2101 06/28/22 0732 06/28/22 1226  GLUCAP 211* 168* 195* 157* 117*   D-Dimer: No results for input(s): "DDIMER" in the last 72 hours. Hgb A1c: No results for input(s): "HGBA1C" in the last 72 hours. Lipid Profile: No results for input(s): "CHOL", "HDL", "LDLCALC", "TRIG", "CHOLHDL", "LDLDIRECT" in the last 72 hours. Thyroid  function studies: No results for input(s): "TSH", "T4TOTAL", "T3FREE", "THYROIDAB" in the last 72 hours.  Invalid input(s): "FREET3" Anemia work up: No results for input(s): "VITAMINB12", "FOLATE", "FERRITIN", "TIBC", "IRON", "RETICCTPCT" in the last 72 hours. Sepsis Labs: Recent Labs  Lab 06/26/22 1956 06/26/22 2319 06/27/22 0511 06/28/22 0559  PROCALCITON  --   --  0.46  --   WBC 23.7*  --  22.2* 17.1*  LATICACIDVEN 2.8* 1.4  --   --     Microbiology Recent Results (from the past 240 hour(s))  Blood culture (single)     Status: Abnormal (Preliminary result)   Collection Time: 06/26/22  7:56 PM   Specimen: BLOOD  Result Value Ref Range Status   Specimen Description   Final    BLOOD RIGHT ANTECUBITAL Performed at [facility], [address], [place], [place] [address]    Special Requests   Final    BOTTLES DRAWN AEROBIC AND ANAEROBIC Blood Culture adequate volume Performed at [facility], [address] [place], [place] [address]    Culture  Setup Time   Final    GRAM POSITIVE COCCI AEROBIC BOTTLE ONLY CRITICAL RESULT CALLED TO, READ BACK BY AND VERIFIED WITH: DEVAN MITCHELL 06/27/2022 1124 CP    Culture (A)  Final    STAPHYLOCOCCUS AUREUS SUSCEPTIBILITIES TO FOLLOW Performed at [facility], [address] [name], [place] [address]    Report Status PENDING  Incomplete  Blood Culture ID Panel (Reflexed)     Status: Abnormal   Collection Time: 06/26/22  7:56 PM  Result Value Ref  Range Status   Enterococcus faecalis NOT DETECTED NOT DETECTED Final   Enterococcus Faecium NOT DETECTED NOT DETECTED Final   Listeria monocytogenes NOT DETECTED NOT DETECTED Final   Staphylococcus species DETECTED (A) NOT DETECTED Final    Comment: CRITICAL RESULT CALLED TO, READ BACK BY AND VERIFIED WITH: DEVAN MITCHELL 06/27/2022 1124 CP    Staphylococcus aureus (BCID) DETECTED (A) NOT DETECTED Final    Comment: Methicillin (oxacillin)-resistant  Staphylococcus aureus (MRSA). MRSA is predictably resistant to beta-lactam antibiotics (except ceftaroline). Preferred therapy is vancomycin unless clinically contraindicated. Patient requires contact precautions if  hospitalized. CRITICAL RESULT CALLED TO, READ BACK BY AND VERIFIED WITH: DEVAN MITCHELL 06/27/2022 1124 CP    Staphylococcus epidermidis NOT DETECTED NOT DETECTED Final   Staphylococcus lugdunensis NOT DETECTED NOT DETECTED Final   Streptococcus species NOT DETECTED NOT DETECTED Final   Streptococcus agalactiae NOT DETECTED NOT DETECTED Final   Streptococcus pneumoniae NOT DETECTED NOT DETECTED Final   Streptococcus pyogenes NOT DETECTED NOT DETECTED Final   A.calcoaceticus-baumannii NOT DETECTED NOT DETECTED Final   Bacteroides fragilis NOT DETECTED NOT DETECTED Final   Enterobacterales NOT DETECTED NOT DETECTED Final   Enterobacter cloacae complex NOT DETECTED NOT DETECTED Final   Escherichia coli NOT DETECTED NOT DETECTED Final   Klebsiella aerogenes NOT DETECTED NOT DETECTED Final   Klebsiella oxytoca NOT DETECTED NOT DETECTED Final   Klebsiella pneumoniae NOT DETECTED NOT DETECTED Final   Proteus species NOT DETECTED NOT DETECTED Final   Salmonella species NOT DETECTED NOT DETECTED Final   Serratia marcescens NOT DETECTED NOT DETECTED Final   Haemophilus influenzae NOT DETECTED NOT DETECTED Final   Neisseria meningitidis NOT DETECTED NOT DETECTED Final   Pseudomonas aeruginosa NOT DETECTED NOT DETECTED Final   Stenotrophomonas maltophilia NOT DETECTED NOT DETECTED Final   Candida albicans NOT DETECTED NOT DETECTED Final   Candida auris NOT DETECTED NOT DETECTED Final   Candida glabrata NOT DETECTED NOT DETECTED Final   Candida krusei NOT DETECTED NOT DETECTED Final   Candida parapsilosis NOT DETECTED NOT DETECTED Final   Candida tropicalis NOT DETECTED NOT DETECTED Final   Cryptococcus neoformans/gattii NOT DETECTED NOT DETECTED Final   Meth resistant mecA/C and MREJ  DETECTED (A) NOT DETECTED Final    Comment: CRITICAL RESULT CALLED TO, READ BACK BY AND VERIFIED WITH: [facility] MITCHELL 06/27/2022 1124 CP Performed at [facility], [address] [place], [place] [address]   Blood culture (single)     Status: None (Preliminary result)   Collection Time: 06/26/22  8:04 PM   Specimen: BLOOD  Result Value Ref Range Status   Specimen Description BLOOD LEFT ANTECUBITAL  Final   Special Requests   Final    BOTTLES DRAWN AEROBIC AND ANAEROBIC Blood Culture adequate volume   Culture  Setup Time   Final    GRAM POSITIVE COCCI AEROBIC BOTTLE ONLY Organism ID to follow CRITICAL RESULT CALLED TO, READ BACK BY AND VERIFIED WITH: NATHAN [email or web address]  06/28/22 RH Performed at [facility], [address] [place], [place] [address]    Culture GRAM POSITIVE COCCI  Final   Report Status PENDING  Incomplete  Blood Culture ID Panel (Reflexed)     Status: Abnormal   Collection Time: 06/26/22  8:04 PM  Result Value Ref Range Status   Enterococcus faecalis NOT DETECTED NOT DETECTED Final   Enterococcus Faecium NOT DETECTED NOT DETECTED Final   Listeria monocytogenes NOT DETECTED NOT DETECTED Final   Staphylococcus species DETECTED (A) NOT DETECTED Final  Comment: CRITICAL RESULT CALLED TO, READ BACK BY AND VERIFIED WITH: NATHAN BLUE@[email or web address]  06/28/22 RH    Staphylococcus aureus (BCID) DETECTED (A) NOT DETECTED Final    Comment: Methicillin (oxacillin)-resistant Staphylococcus aureus (MRSA). MRSA is predictably resistant to beta-lactam antibiotics (except ceftaroline). Preferred therapy is vancomycin unless clinically contraindicated. Patient requires contact precautions if  hospitalized. CRITICAL RESULT CALLED TO, READ BACK BY AND VERIFIED WITH: [email or web address]  06/28/22 RH    Staphylococcus epidermidis NOT DETECTED NOT DETECTED Final   Staphylococcus lugdunensis NOT DETECTED NOT DETECTED Final   Streptococcus species NOT DETECTED NOT DETECTED Final    Streptococcus agalactiae NOT DETECTED NOT DETECTED Final   Streptococcus pneumoniae NOT DETECTED NOT DETECTED Final   Streptococcus pyogenes NOT DETECTED NOT DETECTED Final   A.calcoaceticus-baumannii NOT DETECTED NOT DETECTED Final   Bacteroides fragilis NOT DETECTED NOT DETECTED Final   Enterobacterales NOT DETECTED NOT DETECTED Final   Enterobacter cloacae complex NOT DETECTED NOT DETECTED Final   Escherichia coli NOT DETECTED NOT DETECTED Final   Klebsiella aerogenes NOT DETECTED NOT DETECTED Final   Klebsiella oxytoca NOT DETECTED NOT DETECTED Final   Klebsiella pneumoniae NOT DETECTED NOT DETECTED Final   Proteus species NOT DETECTED NOT DETECTED Final   Salmonella species NOT DETECTED NOT DETECTED Final   Serratia marcescens NOT DETECTED NOT DETECTED Final   Haemophilus influenzae NOT DETECTED NOT DETECTED Final   Neisseria meningitidis NOT DETECTED NOT DETECTED Final   Pseudomonas aeruginosa NOT DETECTED NOT DETECTED Final   Stenotrophomonas maltophilia NOT DETECTED NOT DETECTED Final   Candida albicans NOT DETECTED NOT DETECTED Final   Candida auris NOT DETECTED NOT DETECTED Final   Candida glabrata NOT DETECTED NOT DETECTED Final   Candida krusei NOT DETECTED NOT DETECTED Final   Candida parapsilosis NOT DETECTED NOT DETECTED Final   Candida tropicalis NOT DETECTED NOT DETECTED Final   Cryptococcus neoformans/gattii NOT DETECTED NOT DETECTED Final   Meth resistant mecA/C and MREJ DETECTED (A) NOT DETECTED Corrected    Comment: CRITICAL RESULT CALLED TO, READ BACK BY AND VERIFIED WITH: NATHAN [email or web address]  06/28/22 RH Performed at [facility], [address] [place] [place], [place] [address] CORRECTED ON 05/14 AT 0802: PREVIOUSLY REPORTED AS DETECTED NATHAN [email or web address]@[email or web address]  06/28/22 RH   Aerobic/Anaerobic Culture w Gram Stain (surgical/deep wound)     Status: None (Preliminary result)   Collection Time: 06/27/22  9:09 AM   Specimen: Wound; Tissue  Result Value Ref Range Status    Specimen Description   Final    BONE Performed at [facility], [address], [place], [place] [address]    Special Requests   Final    NONE Performed at [facility], [address] [place], [place] [address]    Gram Stain NO WBC SEEN NO ORGANISMS SEEN   Final   Culture   Final    FEW STAPHYLOCOCCUS AUREUS CULTURE REINCUBATED FOR BETTER GROWTH Performed at [facility], [address] [place], [place] [address]    Report Status PENDING  Incomplete    Procedures and diagnostic studies:  MR FOOT RIGHT W WO CONTRAST  Result Date: 06/27/2022 CLINICAL DATA:  Foot swelling, diabetic, osteomyelitis suspected, xray done EXAM: MRI OF THE RIGHT FOREFOOT WITHOUT AND WITH CONTRAST TECHNIQUE: Multiplanar, multisequence MR imaging of the right forefoot was performed before and after the administration of intravenous contrast. CONTRAST:  9mL GADAVIST GADOBUTROL 1 MMOL/ML IV SOLN COMPARISON:  X-ray 06/26/2022 FINDINGS: Bones/Joint/Cartilage Bone marrow edema and enhancement with intermediate T1 marrow signal within  the second metatarsal head and second toe proximal phalanx. Small-moderate second MTP joint effusion. Prominent bone marrow edema with intermediate T1 marrow signal within the base of the third toe proximal phalanx. There is also bone marrow edema and enhancement within the third metatarsal head. Small third MTP joint effusion. Remaining osseous structures are otherwise intact without significant marrow signal abnormality. No fracture or dislocation. Ligaments Intact Lisfranc ligament.  Intact collateral ligaments. Muscles and Tendons Flexor tenosynovitis of the second digit within the distal forefoot (series 9, images 16-17). There is also tenosynovitis of the second digit extensor tendon at the level of the second metatarsal neck. Denervation changes of the foot musculature. Soft tissues Plantar wound or ulceration underlying the second MTP joint. Extensive surrounding  soft tissue edema. Poor enhancement of the soft tissues at this location is concerning for tissue necrosis (series 13, images 15-17). No drainable fluid collection. IMPRESSION: 1. Plantar wound or ulceration underlying the second MTP joint. Extensive surrounding soft tissue edema compatible with cellulitis. Poor enhancement of the soft tissues at this location is concerning for tissue necrosis. No drainable fluid collection. 2. Acute osteomyelitis of the second metatarsal head and second toe proximal phalanx. Small-moderate second MTP joint effusion is suggestive of septic arthritis. 3. Acute osteomyelitis of the third metatarsal head and base of the third toe proximal phalanx. Small third MTP joint effusion, also concerning for septic arthritis. 4. Flexor tenosynovitis of the second digit within the distal forefoot. There is also tenosynovitis of the second digit extensor tendon at the level of the second metatarsal neck. Electronically Signed   By: Duanne Guess D.O.   On: 06/27/2022 08:25   DG Foot Complete Right  Result Date: 06/26/2022 CLINICAL DATA:  Second toe ulcer, initial encounter EXAM: RIGHT FOOT COMPLETE - 3+ VIEW COMPARISON:  None Available. FINDINGS: Soft tissue ulcer is noted near the second MTP joint. No erosive changes are identified to suggest osteomyelitis. Tiny densities are noted adjacent to the soft tissue wound. These may represent small foreign bodies. IMPRESSION: Soft tissue wound without bony erosive change. Question small foreign bodies within the wound. Electronically Signed   By: Alcide Clever M.D.   On: 06/26/2022 20:13   US Venous Img Lower Unilateral Right  Result Date: 06/26/2022 CLINICAL DATA:  Right lower extremity pain EXAM: RIGHT LOWER EXTREMITY VENOUS DOPPLER ULTRASOUND TECHNIQUE: Gray-scale sonography with compression, as well as color and duplex ultrasound, were performed to evaluate the deep venous system(s) from the level of the common femoral vein through the  popliteal and proximal calf veins. COMPARISON:  None Available. FINDINGS: VENOUS Normal compressibility of the common femoral, superficial femoral, and popliteal veins, as well as the visualized calf veins. Visualized portions of profunda femoral vein and great saphenous vein unremarkable. No filling defects to suggest DVT on grayscale or color Doppler imaging. Doppler waveforms show normal direction of venous flow, normal respiratory plasticity and response to augmentation. Limited views of the contralateral common femoral vein are unremarkable. OTHER Several prominent lymph nodes in the right groin. Limitations: none IMPRESSION: 1.  Negative for DVT in the right lower extremity. 2.  Several nonspecific prominent lymph nodes in the right groin. Electronically Signed   By: Emmaline Kluver M.D.   On: 06/26/2022 19:53               LOS: 2 days   Karan Inclan  [facility]   Pager on [email or web address]. If 7PM-7AM, please contact night-coverage at [email or web address]     06/28/2022, 2:29 PM

## 2022-06-28 NOTE — Consult Note (Signed)
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   No Known Allergies  Patient Measurements: Height: 5\' 10"  (177.8 cm) Weight: 92.5 kg (203 lb 14.8 oz) IBW/kg (Calculated) : 73 Heparin Dosing Weight: 91.6 kg  Vital Signs: Temp: 98.7 F (37.1 C) (05/14 1809) BP: 134/95 (05/14 1809) Pulse Rate: 102 (05/14 1809)  Labs: Recent Labs    06/26/22 1956 06/27/22 0511 06/28/22 0559 06/28/22 1116 06/28/22 1913  HGB 15.6 14.4 14.9  --   --   HCT 45.5 42.2 44.3  --   --   PLT 321 263 277  --   --   APTT  --   --   --  32 32  LABPROT  --  16.1*  --  15.8*  --   INR  --  1.3*  --  1.2  --   CREATININE 0.94 0.82 0.87  --   --      Estimated Creatinine Clearance: 126.4 mL/min (by C-G formula based on SCr of 0.87 mg/dL).   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   Chicken pox    Coronary artery disease    Inferior ST elevation myocardial infarction in July of 2014. Cardiac catheterization showed 95% distal RCA stenosis and 90% first diagonal stenosis with normal ejection fraction. He underwent PCI in bare-metal stent placement to the distal RCA.   Diabetes mellitus without complication (HCC)    Hypercholesterolemia    Stroke (HCC)    Tobacco use     Pertinent Medications:  PTA: Apixaban 5mg  BID, last dose: 5/13@2115   Assessment: SO is a 42 y.o. 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  He presented to the hospital because of pain in the right leg. Plan for right lower extremity angiogram tomorrow, 5/15. Will hold apixaban and start patient no heparin infusion.  Baseline  labs: aPTT 32 sec, INR 1.2, Plts 277, Hgb 14.9  5/14 1913 aPTT 32, Subtherapeutic   Goal of Therapy:  Heparin level 0.3-0.7 units/ml aPTT 66-102 seconds Monitor platelets by anticoagulation protocol: Yes   Plan:  Heparin subtherapeutic Give 2700 units bolus x 1 Increase heparin infusion to 1550 units/hr Check aPTT level in 6 hours following rate change and anti-Xa daily while on heparin Will use aPTT to titrate dose until levels correlate, then will transition to anti-Xa dosing  Continue to monitor H&H and platelets  Sharen Hones, PharmD, BCPS Clinical Pharmacist   06/28/2022,7:41 PM

## 2022-06-28 NOTE — Consult Note (Signed)
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   No Known Allergies  Patient Measurements: Height: 5\' 10"  (177.8 cm) Weight: 92.5 kg (203 lb 14.8 oz) IBW/kg (Calculated) : 73 Heparin Dosing Weight: 91.6 kg  Vital Signs: Temp: 98.1 F (36.7 C) (05/14 0734) Temp Source: Oral (05/14 0734) BP: 135/83 (05/14 0734) Pulse Rate: 105 (05/14 0734)  Labs: Recent Labs    06/26/22 1956 06/27/22 0511 06/28/22 0559 06/28/22 1116  HGB 15.6 14.4 14.9  --   HCT 45.5 42.2 44.3  --   PLT 321 263 277  --   APTT  --   --   --  32  LABPROT  --  16.1*  --  15.8*  INR  --  1.3*  --  1.2  CREATININE 0.94 0.82 0.87  --     Estimated Creatinine Clearance: 126.4 mL/min (by C-G formula based on SCr of 0.87 mg/dL).   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   Chicken pox    Coronary artery disease    Inferior ST elevation myocardial infarction in July of 2014. Cardiac catheterization showed 95% distal RCA stenosis and 90% first diagonal stenosis with normal ejection fraction. He underwent PCI in bare-metal stent placement to the distal RCA.   Diabetes mellitus without complication (HCC)    Hypercholesterolemia    Stroke (HCC)    Tobacco use     Pertinent Medications:  PTA: Apixaban 5mg  BID, last dose: 5/13@2115   Assessment: Martin Mullen is a 42 y.o. 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  He presented to the hospital because of pain in the right leg. Plan for right lower extremity angiogram tomorrow, 5/15. Will hold apixaban and start patient no heparin infusion.  Baseline labs: aPTT 32 sec, INR  1.2, Plts 277, Hgb 14.9  Goal of Therapy:  Heparin level 0.3-0.7 units/ml aPTT 66-102 seconds Monitor platelets by anticoagulation protocol: Yes   Plan:  Give 4000 units bolus x 1 Start heparin infusion at 1200 units/hr Check aPTT level in 6 hours and anti-Xa daily while on heparin Will use aPTT to titrate dose until levels correlate, then will transition to anti-Xa dosing  Continue to monitor H&H and platelets  [name] A [name] 06/28/2022,2:58 PM

## 2022-06-28 NOTE — Progress Notes (Signed)
Date of Admission:  06/26/2022     ID: Martin Mullen is a 42 y.o. 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 Plan is to take him back for surgery on Thursday as per podiatrist  Medications:   aspirin EC  81 mg Oral Daily   buPROPion  150 mg Oral Daily   insulin aspart  0-15 Units Subcutaneous TID WC   insulin aspart  0-5 Units Subcutaneous QHS   insulin glargine-yfgn  16 Units Subcutaneous Daily   losartan  25 mg Oral Daily   nicotine  21 mg Transdermal Daily   polyethylene glycol  17 g Oral Daily   rosuvastatin  20 mg Oral Daily    Objective: Vital signs in last 24 hours: Patient Vitals for the past 24 hrs:  BP Temp Temp src Pulse Resp SpO2  06/28/22 0734 135/83 98.1 F (36.7 C) Oral (!) 105 18 94 %  06/28/22 0506 (!) 147/97 99.3 F (37.4 C) -- (!) 110 20 95 %  06/27/22 2100 -- -- -- 98 -- --  06/27/22 2039 115/81 98.4 F (36.9 C) -- (!) 108 20 94 %  06/27/22 1520 122/81 98.6 F (37 C) Oral 100 18 98 %  06/27/22 1500 123/81 99.1 F (37.3 C) -- 100 14 96 %      PHYSICAL EXAM:  General: Alert, cooperative, no distress,  Lungs: Clear to auscultation bilaterally. No Wheezing or Rhonchi. No rales. Heart: Regular rate and rhythm, no murmur, rub or gallop. Abdomen: Soft, non-tender,not distended. Bowel sounds normal. No  masses Extremities: Right leg surgical dressing not removed Skin: No rashes or lesions. Or bruising Lymph: Cervical, supraclavicular normal. Neurologic: Grossly non-focal  Lab Results    Latest Ref Rng & Units 06/28/2022    5:59 AM 06/27/2022    5:11 AM 06/26/2022    7:56 PM  CBC  WBC 4.0 - 10.5 K/uL 17.1  22.2  23.7   Hemoglobin 13.0 - 17.0 g/dL 96.0  45.4  09.8   Hematocrit 39.0 - 52.0 % 44.3  42.2  45.5   Platelets 150 - 400 K/uL 277  263  321        Latest Ref Rng & Units 06/28/2022    5:59 AM 06/27/2022    5:11 AM 06/26/2022    7:56 PM  CMP  Glucose 70 - 99 mg/dL 119  147  829   BUN 6 - 20 mg/dL 8  9  10    Creatinine 0.61 - 1.24 mg/dL 5.62  1.30  8.65   Sodium 135 - 145 mmol/L 135  131  133   Potassium 3.5 - 5.1 mmol/L 3.6  3.4  3.4   Chloride 98 - 111 mmol/L 101  99  95   CO2 22 - 32 mmol/L 24  21  27    Calcium 8.9 - 10.3  mg/dL 8.4  8.0  8.8   Total Protein 6.5 - 8.1 g/dL   7.8   Total Bilirubin 0.3 - 1.2 mg/dL   1.0   Alkaline Phos 38 - 126 U/L   125   AST 15 - 41 U/L   13   ALT 0 - 44 U/L   8       Microbiology: Blood culture 1 out of 4 MRSA Wound cultures so far Staph aureus Studies/Results: MR FOOT RIGHT W WO CONTRAST  Result Date: 06/27/2022 CLINICAL DATA:  Foot swelling, diabetic, osteomyelitis suspected, xray done EXAM: MRI OF THE RIGHT FOREFOOT WITHOUT AND WITH CONTRAST TECHNIQUE: Multiplanar, multisequence MR imaging of the right forefoot was performed before and after the administration of intravenous contrast. CONTRAST:  9mL GADAVIST GADOBUTROL 1 MMOL/ML IV SOLN COMPARISON:  X-ray 06/26/2022 FINDINGS: Bones/Joint/Cartilage Bone marrow edema and enhancement with intermediate T1 marrow signal within the second metatarsal head and second toe proximal phalanx. Small-moderate second MTP joint effusion. Prominent bone marrow edema with intermediate T1 marrow signal within the base of the third toe proximal phalanx. There is also bone marrow edema and enhancement  within the third metatarsal head. Small third MTP joint effusion. Remaining osseous structures are otherwise intact without significant marrow signal abnormality. No fracture or dislocation. Ligaments Intact Lisfranc ligament.  Intact collateral ligaments. Muscles and Tendons Flexor tenosynovitis of the second digit within the distal forefoot (series 9, images 16-17). There is also tenosynovitis of the second digit extensor tendon at the level of the second metatarsal neck. Denervation changes of the foot musculature. Soft tissues Plantar wound or ulceration underlying the second MTP joint. Extensive surrounding soft tissue edema. Poor enhancement of the soft tissues at this location is concerning for tissue necrosis (series 13, images 15-17). No drainable fluid collection. IMPRESSION: 1. Plantar wound or ulceration underlying the second MTP joint. Extensive surrounding soft tissue edema compatible with cellulitis. Poor enhancement of the soft tissues at this location is concerning for tissue necrosis. No drainable fluid collection. 2. Acute osteomyelitis of the second metatarsal head and second toe proximal phalanx. Small-moderate second MTP joint effusion is suggestive of septic arthritis. 3. Acute osteomyelitis of the third metatarsal head and base of the third toe proximal phalanx. Small third MTP joint effusion, also concerning for septic arthritis. 4. Flexor tenosynovitis of the second digit within the distal forefoot. There is also tenosynovitis of the second digit extensor tendon at the level of the second metatarsal neck. Electronically Signed   By: Duanne Guess D.O.   On: 06/27/2022 08:25   DG Foot Complete Right  Result Date: 06/26/2022 CLINICAL DATA:  Second toe ulcer, initial encounter EXAM: RIGHT FOOT COMPLETE - 3+ VIEW COMPARISON:  None Available. FINDINGS: Soft tissue ulcer is noted near the second MTP joint. No erosive changes are identified to suggest osteomyelitis. Tiny densities are noted  adjacent to the soft tissue wound. These may represent small foreign bodies. IMPRESSION: Soft tissue wound without bony erosive change. Question small foreign bodies within the wound. Electronically Signed   By: Alcide Clever M.D.   On: 06/26/2022 20:13   US Venous Img Lower Unilateral Right  Result Date: 06/26/2022 CLINICAL DATA:  Right lower extremity pain EXAM: RIGHT LOWER EXTREMITY VENOUS DOPPLER ULTRASOUND TECHNIQUE: Gray-scale sonography with compression, as well as color and duplex ultrasound, were performed to evaluate the deep venous system(s) from the level of the common femoral vein through the popliteal and proximal calf veins. COMPARISON:  None Available. FINDINGS: VENOUS Normal compressibility  of the common femoral, superficial femoral, and popliteal veins, as well as the visualized calf veins. Visualized portions of profunda femoral vein and great saphenous vein unremarkable. No filling defects to suggest DVT on grayscale or color Doppler imaging. Doppler waveforms show normal direction of venous flow, normal respiratory plasticity and response to augmentation. Limited views of the contralateral common femoral vein are unremarkable. OTHER Several prominent lymph nodes in the right groin. Limitations: none IMPRESSION: 1.  Negative for DVT in the right lower extremity. 2.  Several nonspecific prominent lymph nodes in the right groin. Electronically Signed   By: Emmaline Kluver M.D.   On: 06/26/2022 19:53     Assessment/Plan:  ?MRSA bacteremia- source is the foot He is currently on vancomycin Will repeat blood cultures He will need 2 d echo/TEE to rule out endocarditis.   Rt foot infection- 2nd toe abscess/osteo Wound plantar surface S/p I/D , 2nd toe ray excision Sent for culture and pathology Will need long term antibiotic- would avoid IV as poor social support and patient prefers oral antibiotic as well If no endocarditis then may consider after 1- week IV to switch to Po Pt  currently on vanco and cefepime If abscess culture no gram neg rods then can stop cefepime   Diabetes mellitus with peripheral neuropathy with DFI Callus left foot On metformin and insulin  HTN   CAD s/p stent Recurrent CVA due to prothrombin gene mutation on eliquis   Current smoker   ? Discussed the management with the patient.

## 2022-06-28 NOTE — Progress Notes (Signed)
Subjective: 1 Day Post-Op Procedure(s) (LRB): INCISION AND DRAINAGE (Right) AMPUTATION TOE RIGHT SECOND TOE (Right) Status post RT second toe amputation w/ incision and drainage  DOS: 06/27/2022  Objective: Vital signs in last 24 hours: Temp:  [98.1 F (36.7 C)-99.3 F (37.4 C)] 98.7 F (37.1 C) (05/14 1809) Pulse Rate:  [98-110] 102 (05/14 1809) Resp:  [18-20] 18 (05/14 1809) BP: (115-147)/(81-97) 134/95 (05/14 1809) SpO2:  [94 %-99 %] 99 % (05/14 1809)  Patient resting comfortably in bed.  Eating lunch  Recent Labs    06/26/22 1956 06/27/22 0511 06/28/22 0559  HGB 15.6 14.4 14.9   Recent Labs    06/27/22 0511 06/28/22 0559  WBC 22.2* 17.1*  RBC 4.64 4.88  HCT 42.2 44.3  PLT 263 277   Recent Labs    06/27/22 0511 06/28/22 0559  NA 131* 135  K 3.4* 3.6  CL 99 101  CO2 21* 24  BUN 9 8  CREATININE 0.82 0.87  GLUCOSE 222* 153*  CALCIUM 8.0* 8.4*   Recent Labs    06/27/22 0511 06/28/22 1116  INR 1.3* 1.2     Assessment/Plan: 1 Day Post-Op Procedure(s) (LRB): INCISION AND DRAINAGE (Right) AMPUTATION TOE RIGHT SECOND TOE (Right) MRSA bacteremia  -Plan for angio with Dr. Wyn Quaker, [facility], tomorrow, 06/27/2022 -Continue ABX as per ID.  Always appreciated.  Currently on vancomycin and cefepime.  Recommended 2D echo/TEE to rule out endocarditis -Plan for repeat irrigation and debridement on 06/30/2022 -Dressings changed.  Leave clean dry and intact -Podiatry will continue to follow  Felecia Shelling 06/28/2022, 6:47 PM

## 2022-06-29 ENCOUNTER — Encounter
Admission: EM | Disposition: A | Discharge status: Home Health | Payer: Self-pay | Source: Home / Self Care | Attending: Internal Medicine

## 2022-06-29 ENCOUNTER — Encounter: Payer: Self-pay | Admitting: Registered Nurse

## 2022-06-29 DIAGNOSIS — I519 Heart disease, unspecified: Secondary | ICD-10-CM | POA: Diagnosis not present

## 2022-06-29 DIAGNOSIS — L089 Local infection of the skin and subcutaneous tissue, unspecified: Secondary | ICD-10-CM

## 2022-06-29 DIAGNOSIS — L97519 Non-pressure chronic ulcer of other part of right foot with unspecified severity: Secondary | ICD-10-CM | POA: Diagnosis not present

## 2022-06-29 DIAGNOSIS — D6852 Prothrombin gene mutation: Secondary | ICD-10-CM | POA: Diagnosis not present

## 2022-06-29 DIAGNOSIS — I739 Peripheral vascular disease, unspecified: Secondary | ICD-10-CM | POA: Diagnosis not present

## 2022-06-29 DIAGNOSIS — B9562 Methicillin resistant Staphylococcus aureus infection as the cause of diseases classified elsewhere: Secondary | ICD-10-CM | POA: Diagnosis not present

## 2022-06-29 DIAGNOSIS — I70235 Atherosclerosis of native arteries of right leg with ulceration of other part of foot: Secondary | ICD-10-CM | POA: Diagnosis not present

## 2022-06-29 DIAGNOSIS — E11621 Type 2 diabetes mellitus with foot ulcer: Secondary | ICD-10-CM | POA: Diagnosis not present

## 2022-06-29 DIAGNOSIS — I2510 Atherosclerotic heart disease of native coronary artery without angina pectoris: Secondary | ICD-10-CM | POA: Diagnosis not present

## 2022-06-29 DIAGNOSIS — R7881 Bacteremia: Secondary | ICD-10-CM | POA: Diagnosis not present

## 2022-06-29 DIAGNOSIS — M86071 Acute hematogenous osteomyelitis, right ankle and foot: Secondary | ICD-10-CM

## 2022-06-29 DIAGNOSIS — E1142 Type 2 diabetes mellitus with diabetic polyneuropathy: Secondary | ICD-10-CM

## 2022-06-29 HISTORY — PX: LOWER EXTREMITY ANGIOGRAPHY: CATH118251

## 2022-06-29 LAB — SURGICAL PATHOLOGY

## 2022-06-29 LAB — BASIC METABOLIC PANEL
Anion gap: 10 (ref 5–15)
BUN: 9 mg/dL (ref 6–20)
CO2: 23 mmol/L (ref 22–32)
Calcium: 8.2 mg/dL — ABNORMAL LOW (ref 8.9–10.3)
Chloride: 100 mmol/L (ref 98–111)
Creatinine, Ser: 0.83 mg/dL (ref 0.61–1.24)
GFR, Estimated: >60 mL/min (ref >60)
Glucose, Bld: 127 mg/dL — ABNORMAL HIGH (ref 70–99)
Potassium: 4 mmol/L (ref 3.5–5.1)
Sodium: 133 mmol/L — ABNORMAL LOW (ref 135–145)

## 2022-06-29 LAB — CBC
HCT: 39.5 % (ref 39.0–52.0)
Hemoglobin: 13.9 g/dL (ref 13.0–17.0)
MCH: 30.9 pg (ref 26.0–34.0)
MCHC: 35.2 g/dL (ref 30.0–36.0)
MCV: 87.8 fL (ref 80.0–100.0)
Platelets: 286 10*3/uL (ref 150–400)
RBC: 4.5 MIL/uL (ref 4.22–5.81)
RDW: 12.2 % (ref 11.5–15.5)
WBC: 18 10*3/uL — ABNORMAL HIGH (ref 4.0–10.5)
nRBC: 0 % (ref 0.0–0.2)

## 2022-06-29 LAB — CULTURE, BLOOD (SINGLE): Special Requests: ADEQUATE

## 2022-06-29 LAB — APTT
aPTT: 40 seconds — ABNORMAL HIGH (ref 24–36)
aPTT: 40 seconds — ABNORMAL HIGH (ref 24–36)

## 2022-06-29 LAB — HEPARIN LEVEL (UNFRACTIONATED): Heparin Unfractionated: 0.26 IU/mL — ABNORMAL LOW (ref 0.30–0.70)

## 2022-06-29 LAB — CULTURE, BLOOD (ROUTINE X 2): Culture: NO GROWTH

## 2022-06-29 LAB — GLUCOSE, CAPILLARY
Glucose-Capillary: 166 mg/dL — ABNORMAL HIGH (ref 70–99)
Glucose-Capillary: 135 mg/dL — ABNORMAL HIGH (ref 70–99)
Glucose-Capillary: 117 mg/dL — ABNORMAL HIGH (ref 70–99)
Glucose-Capillary: 172 mg/dL — ABNORMAL HIGH (ref 70–99)
Glucose-Capillary: 234 mg/dL — ABNORMAL HIGH (ref 70–99)

## 2022-06-29 SURGERY — LOWER EXTREMITY ANGIOGRAPHY
Anesthesia: Moderate Sedation | Laterality: Right

## 2022-06-29 MED ORDER — METHYLPREDNISOLONE SODIUM SUCC 125 MG IJ SOLR: 125.0000 mg | Freq: Once | INTRAMUSCULAR | Status: DC | PRN

## 2022-06-29 MED ORDER — MIDAZOLAM HCL 2 MG/2ML IJ SOLN
INTRAMUSCULAR | Status: DC | PRN
  Administered 2022-06-29 (×2): 1 mg via INTRAVENOUS

## 2022-06-29 MED ORDER — MIDAZOLAM HCL 5 MG/5ML IJ SOLN
INTRAMUSCULAR | Status: AC
  Filled 2022-06-29: qty 5

## 2022-06-29 MED ORDER — ONDANSETRON HCL 4 MG/2ML IJ SOLN: 4.0000 mg | Freq: Four times a day (QID) | INTRAMUSCULAR | Status: DC | PRN

## 2022-06-29 MED ORDER — GUAIFENESIN-DM 100-10 MG/5ML PO SYRP
5.0000 mL | ORAL_SOLUTION | ORAL | Status: DC | PRN
  Administered 2022-06-29 – 2022-07-01 (×3): 5 mL via ORAL
  Filled 2022-06-29 (×3): qty 10

## 2022-06-29 MED ORDER — SODIUM CHLORIDE 0.9 % IV SOLN: INTRAVENOUS | Status: DC

## 2022-06-29 MED ORDER — CHLORHEXIDINE GLUCONATE 4 % EX SOLN: 60.0000 mL | Freq: Once | CUTANEOUS | Status: DC

## 2022-06-29 MED ORDER — FENTANYL CITRATE (PF) 100 MCG/2ML IJ SOLN
INTRAMUSCULAR | Status: AC
  Filled 2022-06-29: qty 2

## 2022-06-29 MED ORDER — HEPARIN BOLUS VIA INFUSION
2700.0000 [IU] | Freq: Once | INTRAVENOUS | Status: AC
  Administered 2022-06-29: 2700 [IU] via INTRAVENOUS
  Filled 2022-06-29: qty 2700

## 2022-06-29 MED ORDER — CEFAZOLIN SODIUM-DEXTROSE 2-4 GM/100ML-% IV SOLN
INTRAVENOUS | Status: AC
  Filled 2022-06-29: qty 100

## 2022-06-29 MED ORDER — FAMOTIDINE 20 MG PO TABS: 40.0000 mg | ORAL_TABLET | Freq: Once | ORAL | Status: DC | PRN

## 2022-06-29 MED ORDER — HEPARIN SODIUM (PORCINE) 1000 UNIT/ML IJ SOLN
INTRAMUSCULAR | Status: DC | PRN
  Administered 2022-06-29: 5000 [IU] via INTRAVENOUS

## 2022-06-29 MED ORDER — ONDANSETRON HCL 4 MG/2ML IJ SOLN
INTRAMUSCULAR | Status: DC | PRN
  Administered 2022-06-29: 4 mg via INTRAVENOUS

## 2022-06-29 MED ORDER — HYDROMORPHONE HCL 1 MG/ML IJ SOLN: 1.0000 mg | Freq: Once | INTRAMUSCULAR | Status: DC | PRN

## 2022-06-29 MED ORDER — CEFAZOLIN SODIUM-DEXTROSE 2-4 GM/100ML-% IV SOLN
2.0000 g | INTRAVENOUS | Status: DC
  Filled 2022-06-29: qty 100

## 2022-06-29 MED ORDER — HEPARIN SODIUM (PORCINE) 1000 UNIT/ML IJ SOLN
INTRAMUSCULAR | Status: AC
  Filled 2022-06-29: qty 10

## 2022-06-29 MED ORDER — MIDAZOLAM HCL 2 MG/ML PO SYRP
8.0000 mg | ORAL_SOLUTION | Freq: Once | ORAL | Status: DC | PRN
  Filled 2022-06-29: qty 5

## 2022-06-29 MED ORDER — IODIXANOL 320 MG/ML IV SOLN
INTRAVENOUS | Status: DC | PRN
  Administered 2022-06-29: 40 mL via INTRA_ARTERIAL

## 2022-06-29 MED ORDER — FENTANYL CITRATE (PF) 100 MCG/2ML IJ SOLN
INTRAMUSCULAR | Status: DC | PRN
  Administered 2022-06-29: 50 ug via INTRAVENOUS

## 2022-06-29 MED ORDER — DIPHENHYDRAMINE HCL 50 MG/ML IJ SOLN: 50.0000 mg | Freq: Once | INTRAMUSCULAR | Status: DC | PRN

## 2022-06-29 MED ORDER — FENTANYL CITRATE PF 50 MCG/ML IJ SOSY: 12.5000 ug | PREFILLED_SYRINGE | Freq: Once | INTRAMUSCULAR | Status: DC | PRN

## 2022-06-29 SURGICAL SUPPLY — 20 items
BALLN LUTONIX 018 5X60X130 (BALLOONS) ×1
BALLN LUTONIX 018 5X80X130 (BALLOONS) ×1
BALLN ULTRVRSE 3X100X150 (BALLOONS) ×1
BALLOON LUTONIX 018 5X60X130 (BALLOONS) IMPLANT
BALLOON LUTONIX 018 5X80X130 (BALLOONS) IMPLANT
BALLOON ULTRVRSE 3X100X150 (BALLOONS) IMPLANT
CATH ANGIO 5F PIGTAIL 65CM (CATHETERS) IMPLANT
CATH BEACON 5 .038 100 VERT TP (CATHETERS) IMPLANT
COVER PROBE ULTRASOUND 5X96 (MISCELLANEOUS) IMPLANT
DEVICE STARCLOSE SE CLOSURE (Vascular Products) IMPLANT
GLIDEWIRE ADV .035X260CM (WIRE) IMPLANT
KIT ENCORE 26 ADVANTAGE (KITS) IMPLANT
PACK ANGIOGRAPHY (CUSTOM PROCEDURE TRAY) ×2 IMPLANT
SHEATH ANL2 6FRX45 HC (SHEATH) IMPLANT
SHEATH BRITE TIP 5FRX11 (SHEATH) IMPLANT
STENT VIABAHN 6X50X120 (Permanent Stent) IMPLANT
SYR MEDRAD MARK 7 150ML (SYRINGE) IMPLANT
TUBING CONTRAST HIGH PRESS 72 (TUBING) IMPLANT
WIRE G V18X300CM (WIRE) IMPLANT
WIRE GUIDERIGHT .035X150 (WIRE) IMPLANT

## 2022-06-29 NOTE — Interval H&P Note (Signed)
History and Physical Interval Note:  06/29/2022 11:16 AM  Martin Mullen  has presented today for surgery, with the diagnosis of PAD.  The various methods of treatment have been discussed with the patient and family. After consideration of risks, benefits and other options for treatment, the patient has consented to  Procedure(s): Lower Extremity Angiography (Right) as a surgical intervention.  The patient's history has been reviewed, patient examined, no change in status, stable for surgery.  I have reviewed the patient's chart and labs.  Questions were answered to the patient's satisfaction.     [name]

## 2022-06-29 NOTE — Op Note (Signed)
[facility]  Percutaneous Study/Intervention Procedural Note   Date of Surgery: 06/29/2022  Surgeon(s):Lakelynn Severtson    Assistants:none  Pre-operative Diagnosis: PAD with ulceration RLE  Post-operative diagnosis:  Same  Procedure(s) Performed:             1.  Ultrasound guidance for vascular access left femoral artery             2.  Catheter placement into right SFA from left femoral approach             3.  Aortogram and selective right lower extremity angiogram             4.  Percutaneous transluminal angioplasty of right popliteal artery with 5 mm diameter by 8 cm length Lutonix drug-coated angioplasty balloon             5.  Percutaneous transluminal angioplasty of right tibioperoneal trunk and proximal posterior tibial artery with 3 mm diameter by 10 cm length angioplasty balloon  6.  Stent placement to the right popliteal artery with 6 mm diameter by 5 cm length Viabahn stent             7.  StarClose closure device left femoral artery  EBL: 5 cc  Contrast: 40 cc  Fluoro Time: 3.7 minutes  Moderate Conscious Sedation Time: approximately 34 minutes using 2 mg of Versed and 50 mcg of Fentanyl              Indications:  Patient is a 42 y.o.male with a nonhealing ulceration of the right foot and a significant history of cardiac disease and concern for severe vascular disease of the lower extremities.  The patient is brought in for angiography for further evaluation and potential treatment.  Due to the limb threatening nature of the situation, angiogram was performed for attempted limb salvage. The patient is aware that if the procedure fails, amputation would be expected.  The patient also understands that even with successful revascularization, amputation may still be required due to the severity of the situation.  Risks and benefits are discussed and informed consent is obtained.   Procedure:  The patient was identified and appropriate procedural time out  was performed.  The patient was then placed supine on the table and prepped and draped in the usual sterile fashion. Moderate conscious sedation was administered during a face to face encounter with the patient throughout the procedure with my supervision of the RN administering medicines and monitoring the patient's vital signs, pulse oximetry, telemetry and mental status throughout from the start of the procedure until the patient was taken to the recovery room. Ultrasound was used to evaluate the left common femoral artery.  It was patent .  A digital ultrasound image was acquired.  A Seldinger needle was used to access the left common femoral artery under direct ultrasound guidance and a permanent image was performed.  A 0.035 J wire was advanced without resistance and a 5Fr sheath was placed.  Pigtail catheter was placed into the aorta and an AP aortogram was performed. This demonstrated normal renal arteries and normal aorta and iliac segments without significant stenosis. I then crossed the aortic bifurcation and advanced to the right femoral head.  After the initial injection, the pigtail catheter was advanced into the proximal to mid right SFA to help opacify distally.  Selective right lower extremity angiogram was then performed. This demonstrated fairly normal common femoral artery, profunda femoris artery, and proximal to mid superficial femoral  artery.  The distal SFA had mild disease but the proximal to mid popliteal artery had a short segment greater than 80% stenosis.  The distal popliteal artery normalized and there was a typical tibial trifurcation.  The anterior tibial artery was occluded without distal reconstitution.  The tibioperoneal trunk had a greater than 90% stenosis that extended down into the origin of the posterior tibial artery in its proximal segment and this was the dominant runoff distally.  The peroneal artery also provided a second runoff vessel but was small and not continuous to  the foot. It was felt that it was in the patient's best interest to proceed with intervention after these images to avoid a second procedure and a larger amount of contrast and fluoroscopy based off of the findings from the initial angiogram. The patient was systemically heparinized and a 6 [place] [employer] sheath was then placed over the [employer] wire. I then used a Kumpe catheter and the advantage wire to cross the stenosis in the popliteal artery as well as the tibioperoneal trunk and proximal posterior tibial artery.  I then advanced a Kumpe catheter into the proximal to mid posterior tibial artery and placed a V18 wire removing the advantage wire.  The diagnostic catheter was then removed and I proceeded with treatment.  A 3 mm diameter by 10 cm length angioplasty balloon was inflated to 12 atm for 1 minute in the right tibioperoneal trunk and proximal posterior tibial artery.  A 5 mm diameter by 8 cm length Lutonix drug-coated angioplasty balloon was inflated to 12 atm for 1 minute in the popliteal artery to treat that lesion.  Completion imaging showed about a 20% residual stenosis in the tibioperoneal trunk and proximal posterior tibial artery but the popliteal artery continued to have a greater than 50% stenosis so I elected to place a stent.  A 6 mm diameter by 5 cm length Viabahn stent was then deployed in the popliteal artery and postdilated with a 5 mm diameter Lutonix drug-coated balloon.  Completion imaging showed no significant residual stenosis in the popliteal artery after stent placement. I elected to terminate the procedure. The sheath was removed and StarClose closure device was deployed in the left femoral artery with excellent hemostatic result. The patient was taken to the recovery room in stable condition having tolerated the procedure well.  Findings:               Aortogram:  This demonstrated normal renal arteries and normal aorta and iliac segments without significant stenosis.              Right Lower Extremity:  This demonstrated fairly normal common femoral artery, profunda femoris artery, and proximal to mid superficial femoral artery.  The distal SFA had mild disease but the proximal to mid popliteal artery had a short segment greater than 80% stenosis.  The distal popliteal artery normalized and there was a typical tibial trifurcation.  The anterior tibial artery was occluded without distal reconstitution.  The tibioperoneal trunk had a greater than 90% stenosis that extended down into the origin of the posterior tibial artery in its proximal segment and this was the dominant runoff distally.  The peroneal artery also provided a second runoff vessel but was small and not continuous to the foot.   Disposition: Patient was taken to the recovery room in stable condition having tolerated the procedure well.  Complications: None  [name] 06/29/2022 11:18 AM   This note was created with Dragon Medical transcription  system. Any errors in dictation are purely unintentional.

## 2022-06-29 NOTE — Consult Note (Signed)
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   No Known Allergies  Patient Measurements: Height: 5\' 10"  (177.8 cm) Weight: 92.5 kg (203 lb 14.8 oz) IBW/kg (Calculated) : 73 Heparin Dosing Weight: 91.6 kg  Vital Signs: Temp: 98.5 F (36.9 C) (05/15 1207) Temp Source: Oral (05/15 1207) BP: 133/85 (05/15 1207) Pulse Rate: 86 (05/15 1207)  Labs: Recent Labs    06/26/22 1956 06/27/22 0511 06/28/22 0559 06/28/22 1116 06/28/22 1913 06/29/22 0250  HGB 15.6 14.4 14.9  --   --  13.9  HCT 45.5 42.2 44.3  --   --  39.5  PLT 321 263 277  --   --  286  APTT  --   --   --  32 32 40*  LABPROT  --  16.1*  --  15.8*  --   --   INR  --  1.3*  --  1.2  --   --   HEPARINUNFRC  --   --   --   --   --  0.26*  CREATININE 0.94 0.82 0.87  --   --   --      Estimated Creatinine Clearance: 126.4 mL/min (by C-G formula based on SCr of 0.87 mg/dL).   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   Chicken pox    Coronary artery disease    Inferior ST elevation myocardial infarction in July of 2014. Cardiac catheterization showed 95% distal RCA stenosis and 90% first diagonal stenosis with normal ejection fraction. He underwent PCI in bare-metal stent placement to the distal RCA.   Diabetes mellitus without complication (HCC)    Hypercholesterolemia    Stroke (HCC)    Tobacco use     Pertinent Medications:  PTA: Apixaban 5mg  BID, last dose: 5/13@2115   Assessment: Martin Mullen is a 42 y.o. 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  He presented to the hospital because of pain in the right leg. Plan  for right lower extremity angiogram tomorrow, 5/15. Will hold apixaban and start patient no heparin infusion.  Baseline labs: aPTT 32 sec, INR 1.2, Plts 277, Hgb 14.9  5/14 1913 aPTT 32, Subtherapeutic  5/15 0250 aPTT 40, subtherapeutic, HL 0.26  Goal of Therapy:  Heparin level 0.3-0.7 units/ml aPTT 66-102 seconds Monitor platelets by anticoagulation protocol: Yes   Plan: received angiogram with stent placement 5/15 AM for PAD with ulceration. Heparin held @1024  and resumed @1201 . Continue heparin infusion at 1800 units/hr Check aPTT 6 hours after resumption of infusion. Next aPTT due @1800 . Will use aPTT to titrate dose until levels correlate, then will transition to anti-Xa dosing. Check anti-Xa daily. Continue to monitor H&H and platelets   Martin Mullen, PharmD, BCPS Clinical Pharmacist  06/29/2022 1:14 PM

## 2022-06-29 NOTE — Consult Note (Signed)
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   No Known Allergies  Patient Measurements: Height: 5\' 10"  (177.8 cm) Weight: 92.5 kg (203 lb 14.8 oz) IBW/kg (Calculated) : 73 Heparin Dosing Weight: 91.6 kg  Vital Signs: Temp: 98.6 F (37 C) (05/15 1719) Temp Source: Oral (05/15 1207) BP: 133/86 (05/15 1719) Pulse Rate: 84 (05/15 1719)  Labs: Recent Labs    06/27/22 0511 06/28/22 0559 06/28/22 1116 06/28/22 1116 06/28/22 1913 06/29/22 0250 06/29/22 1240 06/29/22 1743  HGB 14.4 14.9  --   --   --  13.9  --   --   HCT 42.2 44.3  --   --   --  39.5  --   --   PLT 263 277  --   --   --  286  --   --   APTT  --   --  32   < > 32 40*  --  40*  LABPROT 16.1*  --  15.8*  --   --   --   --   --   INR 1.3*  --  1.2  --   --   --   --   --   HEPARINUNFRC  --   --   --   --   --  0.26*  --   --   CREATININE 0.82 0.87  --   --   --   --  0.83  --    < > = values in this interval not displayed.     Estimated Creatinine Clearance: 132.5 mL/min (by C-G formula based on SCr of 0.83 mg/dL).   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   Chicken pox    Coronary artery disease    Inferior ST elevation myocardial infarction in July of 2014. Cardiac catheterization showed 95% distal RCA stenosis and 90% first diagonal stenosis with normal ejection fraction. He underwent PCI in bare-metal stent placement to the distal RCA.   Diabetes mellitus without complication (HCC)    Hypercholesterolemia    Stroke (HCC)    Tobacco use     Pertinent Medications:  PTA: Apixaban 5mg  BID, last dose: 5/13@2115   Assessment: Martin Mullen is a 42 y.o. 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  He presented to the hospital because of pain in the right leg. Plan for right lower extremity angiogram tomorrow, 5/15. Will hold apixaban and start patient no heparin infusion.  Baseline labs: aPTT 32 sec, INR 1.2, Plts 277, Hgb 14.9  5/14 1913 aPTT 32, Subtherapeutic  5/15 0250 aPTT 40, subtherapeutic, HL 0.26 5/15 1743 aPTT 40, subtherapeutic   Goal of Therapy:  Heparin level 0.3-0.7 units/ml aPTT 66-102 seconds Monitor platelets by anticoagulation protocol: Yes   Plan:  Heparin subtherapeutic  Heparin bolus 2700 units x 1 Increase heparin infusion to 2150 units/hr Check aPTT/HL 6 hours after rate change. Will use aPTT to titrate dose until levels correlate, then will transition to anti-Xa dosing. Check anti-Xa daily. Continue to monitor H&H and platelets   Martin Mullen, PharmD, BCPS Clinical Pharmacist   06/29/2022 6:28 PM

## 2022-06-29 NOTE — Progress Notes (Signed)
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 S/p washout I&D of right foot infection on 06/27/2022.  Continue IV vancomycin and cefepime as per ID.  Follow-up with ID, podiatrist and vascular surgeon.  See Dr. Helayne Seminole notes on how pt met sepsis criteria   MRSA bacteremia: continue on IV vanco, cefepime as per ID.    Hypokalemia: WNL today   Hyponatremia: labile. Will continue to monitor    Hx of CAD: w/ bare-metal stent to distal RCA in 2014. Continue on aspirin, statin   PVD: s/p RLE angiogram in which angioplasty of right popliteal artery, right tibioperoneal trunk, posterior tibial artery was done & stent placed into the right popliteal artery on 06/29/22.  Continue on aspirin, statin.    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 Continue on IV heparin drip. Holding eliquis    DMII: poorly controlled. HbA1c 8.2. Continue on glargine, SSI w/ accuchecks   Overweight: BMI 29.2. Would benefit  from weight loss     DVT prophylaxis: IV heparin  Code Status: full  Family Communication:  Disposition Plan: likely d/c back home  Level of care: Telemetry Medical  Status is: Inpatient Remains inpatient appropriate because: severity of illness    Consultants:  Vasc surg ID   Procedures:   Antimicrobials: cefepime, vanco   Subjective: Pt c/o malaise   Objective: Vitals:   06/28/22 1809 06/29/22 0443 06/29/22 0444 06/29/22 0734  BP: (!) 134/95 123/80  112/82  Pulse: (!) 102 (!) 112 (!) 108 (!) 108  Resp: 18 20  16   Temp: 98.7 F (37.1 C) 99.6 F (37.6 C)  99 F (37.2 C)  TempSrc:      SpO2: 99% 93%  94%  Weight:      Height:        Intake/Output Summary (Last 24 hours) at 06/29/2022 0824 Last data filed at 06/29/2022 4782 Gross per 24 hour  Intake 986.93 ml  Output 1850 ml  Net -863.07 ml   Filed Weights   06/26/22 1838  Weight: 92.5 kg    Examination:  General exam: Appears calm and comfortable  Respiratory system: Clear to auscultation. Respiratory effort normal. Cardiovascular system: S1 & S2 +. No rubs, gallops or clicks Gastrointestinal system: Abdomen is nondistended, soft and nontender.  Normal bowel sounds heard. Central nervous system: Alert and awake. Moves all extremities  Psychiatry: Judgement and insight appears at baseline. Frustrated mood and affect    Data Reviewed: I have personally reviewed following labs and imaging studies  CBC: Recent  Labs  Lab 06/26/22 1956 06/27/22 0511 06/28/22 0559 06/29/22 0250  WBC 23.7* 22.2* 17.1* 18.0*  NEUTROABS 20.2*  --  12.9*  --   HGB 15.6 14.4 14.9 13.9  HCT 45.5 42.2 44.3 39.5  MCV 89.9 90.9 90.8 87.8  PLT 321 263 277 286   Basic Metabolic Panel: Recent Labs  Lab 06/26/22 1956 06/27/22 0511 06/28/22 0559  NA 133* 131* 135  K 3.4* 3.4* 3.6  CL 95* 99 101  CO2 27 21* 24  GLUCOSE 293* 222* 153*  BUN 10 9 8   CREATININE 0.94 0.82 0.87  CALCIUM 8.8* 8.0* 8.4*  MG  --   --  1.8    GFR: Estimated Creatinine Clearance: 126.4 mL/min (by C-G formula based on SCr of 0.87 mg/dL). Liver Function Tests: Recent Labs  Lab 06/26/22 1956  AST 13*  ALT 8  ALKPHOS 125  BILITOT 1.0  PROT 7.8  ALBUMIN 3.4*   No results for input(s): "LIPASE", "AMYLASE" in the last 168 hours. No results for input(s): "AMMONIA" in the last 168 hours. Coagulation Profile: Recent Labs  Lab 06/27/22 0511 06/28/22 1116  INR 1.3* 1.2   Cardiac Enzymes: No results for input(s): "CKTOTAL", "CKMB", "CKMBINDEX", "TROPONINI" in the last 168 hours. BNP (last 3 results) No results for input(s): "PROBNP" in the last 8760 hours. HbA1C: No results for input(s): "HGBA1C" in the last 72 hours. CBG: Recent Labs  Lab 06/28/22 0732 06/28/22 1226 06/28/22 1621 06/28/22 2132 06/29/22 0753  GLUCAP 157* 117* 188* 188* 166*   Lipid Profile: No results for input(s): "CHOL", "HDL", "LDLCALC", "TRIG", "CHOLHDL", "LDLDIRECT" in the last 72 hours. Thyroid Function Tests: No results for input(s): "TSH", "T4TOTAL", "FREET4", "T3FREE", "THYROIDAB" in the last 72 hours. Anemia Panel: No results for input(s): "VITAMINB12", "FOLATE", "FERRITIN", "TIBC", "IRON", "RETICCTPCT" in the last 72 hours. Sepsis Labs: Recent Labs  Lab 06/26/22 1956 06/26/22 2319 06/27/22 0511  PROCALCITON  --   --  0.46  LATICACIDVEN 2.8* 1.4  --     Recent Results (from the past 240 hour(s))  Blood culture (single)     Status: Abnormal   Collection Time: 06/26/22  7:56 PM   Specimen: BLOOD  Result Value Ref Range Status   Specimen Description   Final    BLOOD RIGHT ANTECUBITAL Performed at [facility], [address] [place], [place] [address]    Special Requests   Final    BOTTLES DRAWN AEROBIC AND ANAEROBIC Blood Culture adequate volume Performed at [facility], [address] [place], [place] [address]    Culture  Setup Time   Final    GRAM POSITIVE COCCI AEROBIC BOTTLE ONLY CRITICAL  RESULT CALLED TO, READ BACK BY AND VERIFIED WITH: [facility] MITCHELL 06/27/2022 1124 CP Performed at [facility], [address], [place], [place] [address]    Culture METHICILLIN RESISTANT STAPHYLOCOCCUS AUREUS (A)  Final   Report Status 06/29/2022 FINAL  Final   Organism ID, Bacteria METHICILLIN RESISTANT STAPHYLOCOCCUS AUREUS  Final      Susceptibility   Methicillin resistant staphylococcus aureus - MIC*    CIPROFLOXACIN <=0.5 SENSITIVE Sensitive     ERYTHROMYCIN 0.5 SENSITIVE Sensitive     GENTAMICIN <=0.5 SENSITIVE Sensitive     OXACILLIN >=4 RESISTANT Resistant     TETRACYCLINE >=16 RESISTANT Resistant     VANCOMYCIN 1 SENSITIVE Sensitive     TRIMETH/SULFA <=10 SENSITIVE Sensitive     CLINDAMYCIN <=0.25 SENSITIVE Sensitive     RIFAMPIN <=0.5 SENSITIVE Sensitive  Inducible Clindamycin NEGATIVE Sensitive     LINEZOLID 2 SENSITIVE Sensitive     * METHICILLIN RESISTANT STAPHYLOCOCCUS AUREUS  Blood Culture ID Panel (Reflexed)     Status: Abnormal   Collection Time: 06/26/22  7:56 PM  Result Value Ref Range Status   Enterococcus faecalis NOT DETECTED NOT DETECTED Final   Enterococcus Faecium NOT DETECTED NOT DETECTED Final   Listeria monocytogenes NOT DETECTED NOT DETECTED Final   Staphylococcus species DETECTED (A) NOT DETECTED Final    Comment: CRITICAL RESULT CALLED TO, READ BACK BY AND VERIFIED WITH: DEVAN MITCHELL 06/27/2022 1124 CP    Staphylococcus aureus (BCID) DETECTED (A) NOT DETECTED Final    Comment: Methicillin (oxacillin)-resistant Staphylococcus aureus (MRSA). MRSA is predictably resistant to beta-lactam antibiotics (except ceftaroline). Preferred therapy is vancomycin unless clinically contraindicated. Patient requires contact precautions if  hospitalized. CRITICAL RESULT CALLED TO, READ BACK BY AND VERIFIED WITH: DEVAN MITCHELL 06/27/2022 1124 CP    Staphylococcus epidermidis NOT DETECTED NOT DETECTED Final   Staphylococcus lugdunensis NOT DETECTED NOT  DETECTED Final   Streptococcus species NOT DETECTED NOT DETECTED Final   Streptococcus agalactiae NOT DETECTED NOT DETECTED Final   Streptococcus pneumoniae NOT DETECTED NOT DETECTED Final   Streptococcus pyogenes NOT DETECTED NOT DETECTED Final   A.calcoaceticus-baumannii NOT DETECTED NOT DETECTED Final   Bacteroides fragilis NOT DETECTED NOT DETECTED Final   Enterobacterales NOT DETECTED NOT DETECTED Final   Enterobacter cloacae complex NOT DETECTED NOT DETECTED Final   Escherichia coli NOT DETECTED NOT DETECTED Final   Klebsiella aerogenes NOT DETECTED NOT DETECTED Final   Klebsiella oxytoca NOT DETECTED NOT DETECTED Final   Klebsiella pneumoniae NOT DETECTED NOT DETECTED Final   Proteus species NOT DETECTED NOT DETECTED Final   Salmonella species NOT DETECTED NOT DETECTED Final   Serratia marcescens NOT DETECTED NOT DETECTED Final   Haemophilus influenzae NOT DETECTED NOT DETECTED Final   Neisseria meningitidis NOT DETECTED NOT DETECTED Final   Pseudomonas aeruginosa NOT DETECTED NOT DETECTED Final   Stenotrophomonas maltophilia NOT DETECTED NOT DETECTED Final   Candida albicans NOT DETECTED NOT DETECTED Final   Candida auris NOT DETECTED NOT DETECTED Final   Candida glabrata NOT DETECTED NOT DETECTED Final   Candida krusei NOT DETECTED NOT DETECTED Final   Candida parapsilosis NOT DETECTED NOT DETECTED Final   Candida tropicalis NOT DETECTED NOT DETECTED Final   Cryptococcus neoformans/gattii NOT DETECTED NOT DETECTED Final   Meth resistant mecA/C and MREJ DETECTED (A) NOT DETECTED Final    Comment: CRITICAL RESULT CALLED TO, READ BACK BY AND VERIFIED WITH: [facility] MITCHELL 06/27/2022 1124 CP Performed at [facility] [address] [place], [place] [address]   Blood culture (single)     Status: Abnormal   Collection Time: 06/26/22  8:04 PM   Specimen: BLOOD  Result Value Ref Range Status   Specimen Description   Final    BLOOD LEFT ANTECUBITAL Performed at  [facility], [address] [place], [place] [address]    Special Requests   Final    BOTTLES DRAWN AEROBIC AND ANAEROBIC Blood Culture adequate volume Performed at [facility], [address] [place], [place] [address]    Culture  Setup Time   Final    GRAM POSITIVE COCCI AEROBIC BOTTLE ONLY Organism ID to follow CRITICAL RESULT CALLED TO, READ BACK BY AND VERIFIED WITH: [email or web address]  06/28/22 RH Performed at [facility], [address], [place], [place] [address]    Culture (A)  Final  STAPHYLOCOCCUS AUREUS SUSCEPTIBILITIES PERFORMED ON PREVIOUS CULTURE WITHIN THE LAST 5 DAYS. Performed at [facility], [address], [place], [place] [address]    Report Status 06/29/2022 FINAL  Final  Blood Culture ID Panel (Reflexed)     Status: Abnormal   Collection Time: 06/26/22  8:04 PM  Result Value Ref Range Status   Enterococcus faecalis NOT DETECTED NOT DETECTED Final   Enterococcus Faecium NOT DETECTED NOT DETECTED Final   Listeria monocytogenes NOT DETECTED NOT DETECTED Final   Staphylococcus species DETECTED (A) NOT DETECTED Final    Comment: CRITICAL RESULT CALLED TO, READ BACK BY AND VERIFIED WITH: [email or web address]  06/28/22 RH    Staphylococcus aureus (BCID) DETECTED (A) NOT DETECTED Final    Comment: Methicillin (oxacillin)-resistant Staphylococcus aureus (MRSA). MRSA is predictably resistant to beta-lactam antibiotics (except ceftaroline). Preferred therapy is vancomycin unless clinically contraindicated. Patient requires contact precautions if  hospitalized. CRITICAL RESULT CALLED TO, READ BACK BY AND VERIFIED WITH: [email or web address]  06/28/22 RH    Staphylococcus epidermidis NOT DETECTED NOT DETECTED Final   Staphylococcus lugdunensis NOT DETECTED NOT DETECTED Final   Streptococcus species NOT DETECTED NOT DETECTED Final   Streptococcus agalactiae NOT DETECTED NOT DETECTED Final   Streptococcus pneumoniae NOT DETECTED NOT DETECTED  Final   Streptococcus pyogenes NOT DETECTED NOT DETECTED Final   A.calcoaceticus-baumannii NOT DETECTED NOT DETECTED Final   Bacteroides fragilis NOT DETECTED NOT DETECTED Final   Enterobacterales NOT DETECTED NOT DETECTED Final   Enterobacter cloacae complex NOT DETECTED NOT DETECTED Final   Escherichia coli NOT DETECTED NOT DETECTED Final   Klebsiella aerogenes NOT DETECTED NOT DETECTED Final   Klebsiella oxytoca NOT DETECTED NOT DETECTED Final   Klebsiella pneumoniae NOT DETECTED NOT DETECTED Final   Proteus species NOT DETECTED NOT DETECTED Final   Salmonella species NOT DETECTED NOT DETECTED Final   Serratia marcescens NOT DETECTED NOT DETECTED Final   Haemophilus influenzae NOT DETECTED NOT DETECTED Final   Neisseria meningitidis NOT DETECTED NOT DETECTED Final   Pseudomonas aeruginosa NOT DETECTED NOT DETECTED Final   Stenotrophomonas maltophilia NOT DETECTED NOT DETECTED Final   Candida albicans NOT DETECTED NOT DETECTED Final   Candida auris NOT DETECTED NOT DETECTED Final   Candida glabrata NOT DETECTED NOT DETECTED Final   Candida krusei NOT DETECTED NOT DETECTED Final   Candida parapsilosis NOT DETECTED NOT DETECTED Final   Candida tropicalis NOT DETECTED NOT DETECTED Final   Cryptococcus neoformans/gattii NOT DETECTED NOT DETECTED Final   Meth resistant mecA/C and MREJ DETECTED (A) NOT DETECTED Corrected    Comment: CRITICAL RESULT CALLED TO, READ BACK BY AND VERIFIED WITH: [email or web address]  06/28/22 RH Performed at [facility], [address] [place], [place] [address] CORRECTED ON 05/14 AT 0802: PREVIOUSLY REPORTED AS DETECTED [email or web address]  06/28/22 RH   Aerobic/Anaerobic Culture w Gram Stain (surgical/deep wound)     Status: None (Preliminary result)   Collection Time: 06/27/22  9:09 AM   Specimen: Wound; Tissue  Result Value Ref Range Status   Specimen Description   Final    BONE Performed at [facility], [address] [place], [place]  [address]    Special Requests   Final    NONE Performed at [facility], [address] [place], [place] [address]    Gram Stain NO WBC SEEN NO ORGANISMS SEEN   Final   Culture   Final    FEW STAPHYLOCOCCUS AUREUS CULTURE REINCUBATED FOR BETTER GROWTH Performed at [facility], [address] [place], [place] [address]    Report Status PENDING  Incomplete         Radiology Studies: No results found.      Scheduled Meds:  aspirin EC  81 mg Oral Daily   buPROPion  150 mg Oral Daily   insulin aspart  0-15 Units Subcutaneous TID WC   insulin aspart  0-5 Units Subcutaneous QHS   insulin glargine-yfgn  16 Units Subcutaneous Daily   losartan  25 mg Oral Daily   nicotine  21 mg Transdermal Daily   polyethylene glycol  17 g Oral Daily   rosuvastatin  20 mg Oral Daily   Continuous Infusions:  sodium chloride 75 mL/hr at 06/29/22 0557    ceFAZolin (ANCEF) IV     ceFEPime (MAXIPIME) IV 2 g (06/29/22 0440)   heparin 1,800 Units/hr (06/29/22 0432)   vancomycin Stopped (06/29/22 0007)     LOS: 3 days    Time spent: 35 mins     Charise Killian, MD [facility] Pager [phone]  If 7PM-7AM, please contact night-coverage [email or web address] 06/29/2022, 8:24 AM

## 2022-06-29 NOTE — Progress Notes (Addendum)
Pharmacy Antibiotic Note  Martin Mullen is a 42 y.o. male admitted on 06/26/2022 with MRSA bacteremia from diabetic foot ulcer and osteomyelitis.  Pharmacy has been consulted for Vancomycin and Cefepime  dosing He is s/p I&D and right 2nd toe amputation on 5/13  Today, 06/29/2022 Day #3 vancomycin and cefepime  Renal: SCr stable and WNL per 5/14 labs WBC 18 Afebile 5/12 blood cx: 2/2 sets MRSA 5/15 repeat Bcx NGTD 5/13 Bone: S aureus (and GNR) ECHO pending Angioplasty and stent 5/15  Plan: Continue Cefepime 2 gm IV Q8H pending identification of GNR in wound culture Continue Vancomycin 1500 mg IV Q12H (goal AUC 400-600) Calculate AUC = 454, using SCr 0.87, Vd 0.72 L/kg Vanc trough = 11.2  Await cultures and antibiotic plan Check vancomycin levels if no plan to change antibiotic  Follow renal function   Height: 5\' 10"  (177.8 cm) Weight: 92.5 kg (203 lb 14.8 oz) IBW/kg (Calculated) : 73  Temp (24hrs), Avg:98.8 F (37.1 C), Min:97.7 F (36.5 C), Max:99.6 F (37.6 C)  Recent Labs  Lab 06/26/22 1956 06/26/22 2319 06/27/22 0511 06/28/22 0559 06/29/22 0250  WBC 23.7*  --  22.2* 17.1* 18.0*  CREATININE 0.94  --  0.82 0.87  --   LATICACIDVEN 2.8* 1.4  --   --   --      Estimated Creatinine Clearance: 126.4 mL/min (by C-G formula based on SCr of 0.87 mg/dL).    No Known Allergies  Antimicrobials this admission:   Vancomycin 5/13 >>    Cefepime 5/13 >>   Dose adjustments this admission:  Microbiology results: 5/12 blood cx: 2/2 sets MRSA 5/15 repeat Bcx NGTD 5/13 Bone: S aureus (and GNR)  Thank you for allowing pharmacy to be a part of this patient's care.  Juliette Alcide, PharmD, BCPS, BCIDP Work Cell: [phone] 06/29/2022 12:03 PM

## 2022-06-29 NOTE — Consult Note (Signed)
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   No Known Allergies  Patient Measurements: Height: 5\' 10"  (177.8 cm) Weight: 92.5 kg (203 lb 14.8 oz) IBW/kg (Calculated) : 73 Heparin Dosing Weight: 91.6 kg  Vital Signs: Temp: 98.7 F (37.1 C) (05/14 1809) BP: 134/95 (05/14 1809) Pulse Rate: 102 (05/14 1809)  Labs: Recent Labs    06/26/22 1956 06/27/22 0511 06/28/22 0559 06/28/22 1116 06/28/22 1913 06/29/22 0250  HGB 15.6 14.4 14.9  --   --  13.9  HCT 45.5 42.2 44.3  --   --  39.5  PLT 321 263 277  --   --  286  APTT  --   --   --  32 32 40*  LABPROT  --  16.1*  --  15.8*  --   --   INR  --  1.3*  --  1.2  --   --   HEPARINUNFRC  --   --   --   --   --  0.26*  CREATININE 0.94 0.82 0.87  --   --   --      Estimated Creatinine Clearance: 126.4 mL/min (by C-G formula based on SCr of 0.87 mg/dL).   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   Chicken pox    Coronary artery disease    Inferior ST elevation myocardial infarction in July of 2014. Cardiac catheterization showed 95% distal RCA stenosis and 90% first diagonal stenosis with normal ejection fraction. He underwent PCI in bare-metal stent placement to the distal RCA.   Diabetes mellitus without complication (HCC)    Hypercholesterolemia    Stroke (HCC)    Tobacco use     Pertinent Medications:  PTA: Apixaban 5mg  BID, last dose: 5/13@2115   Assessment: SO is a 42 y.o. 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  He presented to the hospital because of pain in the right leg. Plan for right lower extremity  angiogram tomorrow, 5/15. Will hold apixaban and start patient no heparin infusion.  Baseline labs: aPTT 32 sec, INR 1.2, Plts 277, Hgb 14.9  5/14 1913 aPTT 32, Subtherapeutic  5/15 0250 aPTT 40, subtherapeutic, HL 0.26  Goal of Therapy:  Heparin level 0.3-0.7 units/ml aPTT 66-102 seconds Monitor platelets by anticoagulation protocol: Yes   Plan:  Heparin subtherapeutic Give 2700 units bolus x 1 Increase heparin infusion to 1800 units/hr Check aPTT level in 6 hours following rate change and anti-Xa daily while on heparin Will use aPTT to titrate dose until levels correlate, then will transition to anti-Xa dosing  Continue to monitor H&H and platelets  Otelia Sergeant, PharmD, [facility] 06/29/2022 4:10 AM

## 2022-06-29 NOTE — Progress Notes (Signed)
Date of Admission:  06/26/2022     ID: Martin Mullen is a 42 y.o. 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 Plan is to take him back for surgery on Thursday as per podiatrist  Medications:   aspirin EC  81 mg Oral Daily   buPROPion  150 mg Oral Daily   insulin aspart  0-15 Units Subcutaneous TID WC   insulin aspart  0-5 Units Subcutaneous QHS   insulin glargine-yfgn  16 Units Subcutaneous Daily   losartan  25 mg Oral Daily   nicotine  21 mg Transdermal Daily   polyethylene glycol  17 g Oral Daily   rosuvastatin  20 mg Oral Daily    Objective: Vital signs in last 24 hours: Patient Vitals for the past 24 hrs:  BP Temp Temp src Pulse Resp SpO2  06/29/22 1207 133/85 98.5 F (36.9 C) Oral 86 16 96 %  06/29/22 1145 128/86 -- -- 95 15 98 %  06/29/22 1130 118/83 -- -- 98 15 98 %  06/29/22 1115 131/89 -- -- 95 15 96 %  06/29/22 1113 (!) 130/95 -- -- 100 13 96 %  06/29/22 1110 -- -- -- -- -- 96 %  06/29/22 1108 137/89 -- -- (!) 102 14 96 %  06/29/22 1105 -- -- -- -- -- 96 %  06/29/22 1103 111/78 -- -- 100 16 94 %  06/29/22 1100 -- -- -- -- -- 95 %  06/29/22 1058 124/88 -- -- 100 16 96 %  06/29/22 1055 -- -- -- -- -- 95 %  06/29/22 1053 (!) 159/97 -- -- (!) 105 20 97 %  06/29/22 1050 -- -- -- -- 20 96 %  06/29/22  1048 126/89 -- -- (!) 102 17 96 %  06/29/22 1045 -- -- -- -- 18 95 %  06/29/22 1043 (!) 124/93 -- -- (!) 103 13 96 %  06/29/22 1038 (!) 134/93 -- -- 100 19 99 %  06/29/22 1035 -- -- -- -- 16 96 %  06/29/22 1033 130/87 -- -- (!) 101 17 98 %  06/29/22 1030 130/87 -- -- 98 20 98 %  06/29/22 1028 (!) 137/90 -- -- 99 17 97 %  06/29/22 1028 (!) 137/90 -- -- (!) 102 16 98 %  06/29/22 1023 -- -- -- (!) 0 -- --  06/29/22 1020 122/83 97.7 F (36.5 C) Oral (!) 109 20 93 %  06/29/22 0734 112/82 99 F (37.2 C) -- (!) 108 16 94 %  06/29/22 0444 -- -- -- (!) 108 -- --  06/29/22 0443 123/80 99.6 F (37.6 C) -- (!) 112 20 93 %  06/28/22 1809 (!) 134/95 98.7 F (37.1 C) -- (!) 102 18 99 %      PHYSICAL EXAM:  General: Alert, cooperative, no distress,  Lungs: Clear  to auscultation bilaterally. No Wheezing or Rhonchi. No rales. Heart: Regular rate and rhythm, no murmur, rub or gallop. Abdomen: Soft, non-tender,not distended. Bowel sounds normal. No masses Extremities: Right leg surgical dressing not removed Skin: No rashes or lesions. Or bruising Lymph: Cervical, supraclavicular normal. Neurologic: Grossly non-focal  Lab Results    Latest Ref Rng & Units 06/29/2022    2:50 AM 06/28/2022    5:59 AM 06/27/2022    5:11 AM  CBC  WBC 4.0 - 10.5 K/uL 18.0  17.1  22.2   Hemoglobin 13.0 - 17.0 g/dL 13.0  86.5  78.4   Hematocrit 39.0 - 52.0 % 39.5  44.3  42.2   Platelets 150 - 400 K/uL 286  277  263        Latest Ref Rng & Units 06/29/2022   12:40 PM 06/28/2022    5:59 AM 06/27/2022    5:11 AM  CMP  Glucose 70 - 99 mg/dL 696  295  284   BUN 6 - 20 mg/dL 9  8  9    Creatinine 0.61 - 1.24 mg/dL 1.32  4.40  1.02   Sodium 135 - 145 mmol/L 133  135  131   Potassium 3.5 - 5.1 mmol/L 4.0  3.6  3.4   Chloride 98 - 111 mmol/L 100  101  99   CO2 22 - 32 mmol/L 23  24  21    Calcium 8.9 - 10.3 mg/dL 8.2  8.4  8.0       Microbiology: Blood culture 1 out of 4 MRSA Wound cultures so far Staph  aureus Studies/Results: PERIPHERAL VASCULAR CATHETERIZATION  Result Date: 06/29/2022 See surgical note for result.    Assessment/Plan:  ?MRSA bacteremia- source is the foot He is currently on vancomycin Will repeat blood cultures He will need 2 d echo/TEE to rule out endocarditis. Discussed with patient and expalined the rational for TEE and he is willing to get it after  refusing earlier in the day   Rt foot infection- 2nd toe abscess/osteo Wound plantar surface S/p I/D , 2nd toe ray excision Sent for culture and pathology Will need long term antibiotic- would avoid IV as poor social support and patient prefers oral antibiotic as well If no endocarditis then may consider after 1- week IV to switch to Po Pt currently on vanco and cefepime    Diabetes mellitus with peripheral neuropathy with DFI Callus left foot On metformin and insulin  HTN   CAD s/p stent Recurrent CVA due to prothrombin gene mutation on eliquis   Current smoker   ? Discussed the management with the patient.

## 2022-06-29 NOTE — Progress Notes (Signed)
Subjective:  Patient ID: Martin Mullen, male    DOB: [birth year],  MRN: [id number]  Patient seen at bedside POD #3 partial second ray amputation right foot  Negative for chest pain and shortness of breath Fever: no Night sweats: no Constitutional signs: no Review of all other systems is negative Objective:   Vitals:   06/29/22 1145 06/29/22 1207  BP: 128/86 133/85  Pulse: 95 86  Resp: 15 16  Temp:  98.5 F (36.9 C)  SpO2: 98% 96%   General AA&O x3. Normal mood and affect.  Vascular Palpable DP, foot is warm and well-perfused  Neurologic Epicritic sensation grossly absent.  Dermatologic No active bleeding, small necrosis area in the plantar flap, no purulence no malodor  Orthopedic: MMT 5/5 in dorsiflexion, plantarflexion, inversion, and eversion. Normal joint ROM without pain or crepitus.      Results for orders placed or performed during the hospital encounter of 06/26/22  Blood culture (single)     Status: Abnormal   Collection Time: 06/26/22  7:56 PM   Specimen: BLOOD  Result Value Ref Range Status   Specimen Description   Final    BLOOD RIGHT ANTECUBITAL Performed at [facility], [address], [place], [place] [address]    Special Requests   Final    BOTTLES DRAWN AEROBIC AND ANAEROBIC Blood Culture adequate volume Performed at [facility], [address] [place], [place] [address]    Culture  Setup Time   Final    GRAM POSITIVE COCCI AEROBIC BOTTLE ONLY CRITICAL RESULT CALLED TO, READ BACK BY AND VERIFIED WITH: [facility] MITCHELL 06/27/2022 1124 CP Performed at [facility], [address], [place], [place] [address]    Culture METHICILLIN RESISTANT STAPHYLOCOCCUS AUREUS (A)  Final   Report Status 06/29/2022 FINAL  Final   Organism ID, Bacteria METHICILLIN RESISTANT STAPHYLOCOCCUS AUREUS  Final      Susceptibility   Methicillin resistant staphylococcus aureus - MIC*    CIPROFLOXACIN <=0.5 SENSITIVE Sensitive     ERYTHROMYCIN 0.5  SENSITIVE Sensitive     GENTAMICIN <=0.5 SENSITIVE Sensitive     OXACILLIN >=4 RESISTANT Resistant     TETRACYCLINE >=16 RESISTANT Resistant     VANCOMYCIN 1 SENSITIVE Sensitive     TRIMETH/SULFA <=10 SENSITIVE Sensitive     CLINDAMYCIN <=0.25 SENSITIVE Sensitive     RIFAMPIN <=0.5 SENSITIVE Sensitive     Inducible Clindamycin NEGATIVE Sensitive     LINEZOLID 2 SENSITIVE Sensitive     * METHICILLIN RESISTANT STAPHYLOCOCCUS AUREUS  Blood Culture ID Panel (Reflexed)     Status: Abnormal   Collection Time: 06/26/22  7:56 PM  Result Value Ref Range Status   Enterococcus faecalis NOT DETECTED NOT DETECTED Final   Enterococcus Faecium NOT DETECTED NOT DETECTED Final   Listeria monocytogenes NOT DETECTED NOT DETECTED Final   Staphylococcus species DETECTED (A) NOT DETECTED Final    Comment: CRITICAL RESULT CALLED TO, READ BACK BY AND VERIFIED WITH: DEVAN MITCHELL 06/27/2022 1124 CP    Staphylococcus aureus (BCID) DETECTED (A) NOT DETECTED Final    Comment: Methicillin (oxacillin)-resistant Staphylococcus aureus (MRSA). MRSA is predictably resistant to beta-lactam antibiotics (except ceftaroline). Preferred therapy is vancomycin unless clinically contraindicated. Patient requires contact precautions if  hospitalized. CRITICAL RESULT CALLED TO, READ BACK BY AND VERIFIED WITH: DEVAN MITCHELL 06/27/2022 1124 CP    Staphylococcus epidermidis NOT DETECTED NOT DETECTED Final   Staphylococcus lugdunensis NOT DETECTED NOT DETECTED Final   Streptococcus species NOT DETECTED NOT DETECTED Final  Streptococcus agalactiae NOT DETECTED NOT DETECTED Final   Streptococcus pneumoniae NOT DETECTED NOT DETECTED Final   Streptococcus pyogenes NOT DETECTED NOT DETECTED Final   A.calcoaceticus-baumannii NOT DETECTED NOT DETECTED Final   Bacteroides fragilis NOT DETECTED NOT DETECTED Final   Enterobacterales NOT DETECTED NOT DETECTED Final   Enterobacter cloacae complex NOT DETECTED NOT DETECTED Final    Escherichia coli NOT DETECTED NOT DETECTED Final   Klebsiella aerogenes NOT DETECTED NOT DETECTED Final   Klebsiella oxytoca NOT DETECTED NOT DETECTED Final   Klebsiella pneumoniae NOT DETECTED NOT DETECTED Final   Proteus species NOT DETECTED NOT DETECTED Final   Salmonella species NOT DETECTED NOT DETECTED Final   Serratia marcescens NOT DETECTED NOT DETECTED Final   Haemophilus influenzae NOT DETECTED NOT DETECTED Final   Neisseria meningitidis NOT DETECTED NOT DETECTED Final   Pseudomonas aeruginosa NOT DETECTED NOT DETECTED Final   Stenotrophomonas maltophilia NOT DETECTED NOT DETECTED Final   Candida albicans NOT DETECTED NOT DETECTED Final   Candida auris NOT DETECTED NOT DETECTED Final   Candida glabrata NOT DETECTED NOT DETECTED Final   Candida krusei NOT DETECTED NOT DETECTED Final   Candida parapsilosis NOT DETECTED NOT DETECTED Final   Candida tropicalis NOT DETECTED NOT DETECTED Final   Cryptococcus neoformans/gattii NOT DETECTED NOT DETECTED Final   Meth resistant mecA/C and MREJ DETECTED (A) NOT DETECTED Final    Comment: CRITICAL RESULT CALLED TO, READ BACK BY AND VERIFIED WITH: [facility] MITCHELL 06/27/2022 1124 CP Performed at [facility], [address] [place], [place] [address]   Blood culture (single)     Status: Abnormal   Collection Time: 06/26/22  8:04 PM   Specimen: BLOOD  Result Value Ref Range Status   Specimen Description   Final    BLOOD LEFT ANTECUBITAL Performed at [facility], [address] [place], [place] [address]    Special Requests   Final    BOTTLES DRAWN AEROBIC AND ANAEROBIC Blood Culture adequate volume Performed at [facility], [address] [place], [place] [address]    Culture  Setup Time   Final    GRAM POSITIVE COCCI AEROBIC BOTTLE ONLY Organism ID to follow CRITICAL RESULT CALLED TO, READ BACK BY AND VERIFIED WITH: [email or web address]  06/28/22 RH Performed at [facility], [address] [place], [place] [address]    Culture (A)  Final    STAPHYLOCOCCUS AUREUS SUSCEPTIBILITIES PERFORMED ON PREVIOUS CULTURE WITHIN THE LAST 5 DAYS. Performed at [facility], [address] [place], [place] [address]    Report Status 06/29/2022 FINAL  Final  Blood Culture ID Panel (Reflexed)     Status: Abnormal   Collection Time: 06/26/22  8:04 PM  Result Value Ref Range Status   Enterococcus faecalis NOT DETECTED NOT DETECTED Final   Enterococcus Faecium NOT DETECTED NOT DETECTED Final   Listeria monocytogenes NOT DETECTED NOT DETECTED Final   Staphylococcus species DETECTED (A) NOT DETECTED Final    Comment: CRITICAL RESULT CALLED TO, READ BACK BY AND VERIFIED WITH: [email or web address]  06/28/22 RH    Staphylococcus aureus (BCID) DETECTED (A) NOT DETECTED Final    Comment: Methicillin (oxacillin)-resistant Staphylococcus aureus (MRSA). MRSA is predictably resistant to beta-lactam antibiotics (except ceftaroline). Preferred therapy is vancomycin unless clinically contraindicated. Patient requires contact precautions if  hospitalized. CRITICAL RESULT CALLED TO, READ BACK BY AND VERIFIED WITH: [email or web address]  06/28/22 RH    Staphylococcus epidermidis NOT DETECTED NOT DETECTED Final   Staphylococcus lugdunensis NOT DETECTED NOT  DETECTED Final   Streptococcus species NOT DETECTED NOT DETECTED Final   Streptococcus agalactiae NOT DETECTED NOT DETECTED Final   Streptococcus pneumoniae NOT DETECTED NOT DETECTED Final   Streptococcus pyogenes NOT DETECTED NOT DETECTED Final   A.calcoaceticus-baumannii NOT DETECTED NOT DETECTED Final   Bacteroides fragilis NOT DETECTED NOT DETECTED Final   Enterobacterales NOT DETECTED NOT DETECTED Final   Enterobacter cloacae complex NOT DETECTED NOT DETECTED Final   Escherichia coli NOT DETECTED NOT DETECTED Final   Klebsiella aerogenes NOT DETECTED NOT DETECTED Final   Klebsiella oxytoca NOT DETECTED NOT DETECTED Final   Klebsiella pneumoniae NOT  DETECTED NOT DETECTED Final   Proteus species NOT DETECTED NOT DETECTED Final   Salmonella species NOT DETECTED NOT DETECTED Final   Serratia marcescens NOT DETECTED NOT DETECTED Final   Haemophilus influenzae NOT DETECTED NOT DETECTED Final   Neisseria meningitidis NOT DETECTED NOT DETECTED Final   Pseudomonas aeruginosa NOT DETECTED NOT DETECTED Final   Stenotrophomonas maltophilia NOT DETECTED NOT DETECTED Final   Candida albicans NOT DETECTED NOT DETECTED Final   Candida auris NOT DETECTED NOT DETECTED Final   Candida glabrata NOT DETECTED NOT DETECTED Final   Candida krusei NOT DETECTED NOT DETECTED Final   Candida parapsilosis NOT DETECTED NOT DETECTED Final   Candida tropicalis NOT DETECTED NOT DETECTED Final   Cryptococcus neoformans/gattii NOT DETECTED NOT DETECTED Final   Meth resistant mecA/C and MREJ DETECTED (A) NOT DETECTED Corrected    Comment: CRITICAL RESULT CALLED TO, READ BACK BY AND VERIFIED WITH: [email or web address]  06/28/22 RH Performed at [facility], [address] [place], [place] [address] CORRECTED ON 05/14 AT 0802: PREVIOUSLY REPORTED AS DETECTED [email or web address]  06/28/22 RH   Aerobic/Anaerobic Culture w Gram Stain (surgical/deep wound)     Status: None (Preliminary result)   Collection Time: 06/27/22  9:09 AM   Specimen: Wound; Tissue  Result Value Ref Range Status   Specimen Description   Final    BONE Performed at [facility], [address] [place], [place] [address]    Special Requests   Final    NONE Performed at [facility], [address] [place], [place] [address]    Gram Stain   Final    NO WBC SEEN NO ORGANISMS SEEN Performed at [facility], [address] [place], [place] [address]    Culture   Final    FEW STAPHYLOCOCCUS AUREUS SUSCEPTIBILITIES TO FOLLOW FEW GRAM NEGATIVE RODS IDENTIFICATION AND SUSCEPTIBILITIES TO FOLLOW NO ANAEROBES ISOLATED; CULTURE IN PROGRESS FOR 5 DAYS    Report Status  PENDING  Incomplete  Culture, blood (Routine X 2) w Reflex to ID Panel     Status: None (Preliminary result)   Collection Time: 06/29/22  2:50 AM   Specimen: BLOOD  Result Value Ref Range Status   Specimen Description BLOOD RIGHT HAND  Final   Special Requests   Final    BOTTLES DRAWN AEROBIC AND ANAEROBIC Blood Culture adequate volume   Culture   Final    NO GROWTH < 12 HOURS Performed at [facility], [address], [place], [place] [address]    Report Status PENDING  Incomplete  Culture, blood (Routine X 2) w Reflex to ID Panel     Status: None (Preliminary result)   Collection Time: 06/29/22  2:50 AM   Specimen: BLOOD  Result Value Ref Range Status   Specimen Description BLOOD RIGHT ARM  Final   Special Requests   Final  BOTTLES DRAWN AEROBIC AND ANAEROBIC Blood Culture adequate volume   Culture   Final    NO GROWTH < 12 HOURS Performed at [facility], [address] [place], [place] [address]    Report Status PENDING  Incomplete    Assessment & Plan:  Patient was evaluated and treated and all questions answered.  POD #3 partial second ray amp -Underwent successful revascularization today, intervention from vascular surgery appreciated -Nonweightbearing right lower extremity -Return to OR tomorrow for debridement, graft and VAC application with Dr. Allena Katz -N.p.o. past midnight -Pathology proximal margin is clear of osteomyelitis, should be sufficient with 10 to 14 days of p.o. antibiotics, cultures not finalized growing Staph aureus and GNR's  Edwin Cap, DPM  Accessible via secure chat for questions or concerns.

## 2022-06-30 ENCOUNTER — Inpatient Hospital Stay: Payer: Medicaid Other | Admitting: General Practice

## 2022-06-30 ENCOUNTER — Encounter
Admission: EM | Disposition: A | Discharge status: Home Health | Payer: Self-pay | Source: Home / Self Care | Attending: Internal Medicine

## 2022-06-30 ENCOUNTER — Other Ambulatory Visit: Payer: Self-pay

## 2022-06-30 ENCOUNTER — Encounter: Payer: Self-pay | Admitting: Vascular Surgery

## 2022-06-30 DIAGNOSIS — E1169 Type 2 diabetes mellitus with other specified complication: Secondary | ICD-10-CM | POA: Diagnosis not present

## 2022-06-30 DIAGNOSIS — M86071 Acute hematogenous osteomyelitis, right ankle and foot: Secondary | ICD-10-CM | POA: Diagnosis not present

## 2022-06-30 DIAGNOSIS — B9562 Methicillin resistant Staphylococcus aureus infection as the cause of diseases classified elsewhere: Secondary | ICD-10-CM | POA: Diagnosis not present

## 2022-06-30 DIAGNOSIS — L03115 Cellulitis of right lower limb: Secondary | ICD-10-CM | POA: Diagnosis not present

## 2022-06-30 DIAGNOSIS — M8608 Acute hematogenous osteomyelitis, other sites: Secondary | ICD-10-CM | POA: Diagnosis not present

## 2022-06-30 DIAGNOSIS — T148XXA Other injury of unspecified body region, initial encounter: Secondary | ICD-10-CM

## 2022-06-30 DIAGNOSIS — M869 Osteomyelitis, unspecified: Secondary | ICD-10-CM

## 2022-06-30 DIAGNOSIS — I739 Peripheral vascular disease, unspecified: Secondary | ICD-10-CM | POA: Diagnosis not present

## 2022-06-30 DIAGNOSIS — L97522 Non-pressure chronic ulcer of other part of left foot with fat layer exposed: Secondary | ICD-10-CM

## 2022-06-30 DIAGNOSIS — Z7901 Long term (current) use of anticoagulants: Secondary | ICD-10-CM

## 2022-06-30 DIAGNOSIS — M8618 Other acute osteomyelitis, other site: Secondary | ICD-10-CM

## 2022-06-30 DIAGNOSIS — L97512 Non-pressure chronic ulcer of other part of right foot with fat layer exposed: Secondary | ICD-10-CM | POA: Diagnosis not present

## 2022-06-30 DIAGNOSIS — R7881 Bacteremia: Secondary | ICD-10-CM | POA: Diagnosis not present

## 2022-06-30 HISTORY — PX: INCISION AND DRAINAGE OF WOUND: SHX1803

## 2022-06-30 LAB — CBC
HCT: 39.2 % (ref 39.0–52.0)
Hemoglobin: 13.4 g/dL (ref 13.0–17.0)
MCH: 30.2 pg (ref 26.0–34.0)
MCHC: 34.2 g/dL (ref 30.0–36.0)
MCV: 88.3 fL (ref 80.0–100.0)
Platelets: 283 10*3/uL (ref 150–400)
RBC: 4.44 MIL/uL (ref 4.22–5.81)
RDW: 12.3 % (ref 11.5–15.5)
WBC: 15 10*3/uL — ABNORMAL HIGH (ref 4.0–10.5)
nRBC: 0 % (ref 0.0–0.2)

## 2022-06-30 LAB — BASIC METABOLIC PANEL
Anion gap: 10 (ref 5–15)
BUN: 9 mg/dL (ref 6–20)
CO2: 23 mmol/L (ref 22–32)
Calcium: 8.4 mg/dL — ABNORMAL LOW (ref 8.9–10.3)
Chloride: 98 mmol/L (ref 98–111)
Creatinine, Ser: 0.82 mg/dL (ref 0.61–1.24)
GFR, Estimated: >60 mL/min (ref >60)
Glucose, Bld: 198 mg/dL — ABNORMAL HIGH (ref 70–99)
Potassium: 4.1 mmol/L (ref 3.5–5.1)
Sodium: 131 mmol/L — ABNORMAL LOW (ref 135–145)

## 2022-06-30 LAB — CULTURE, BLOOD (ROUTINE X 2): Special Requests: ADEQUATE

## 2022-06-30 LAB — GLUCOSE, CAPILLARY
Glucose-Capillary: 169 mg/dL — ABNORMAL HIGH (ref 70–99)
Glucose-Capillary: 117 mg/dL — ABNORMAL HIGH (ref 70–99)
Glucose-Capillary: 314 mg/dL — ABNORMAL HIGH (ref 70–99)
Glucose-Capillary: 325 mg/dL — ABNORMAL HIGH (ref 70–99)

## 2022-06-30 LAB — APTT
aPTT: 52 seconds — ABNORMAL HIGH (ref 24–36)
aPTT: 66 seconds — ABNORMAL HIGH (ref 24–36)
aPTT: 53 seconds — ABNORMAL HIGH (ref 24–36)

## 2022-06-30 LAB — VANCOMYCIN, TROUGH: Vancomycin Tr: 13 ug/mL — ABNORMAL LOW (ref 15–20)

## 2022-06-30 LAB — VANCOMYCIN, PEAK: Vancomycin Pk: 24 ug/mL — ABNORMAL LOW (ref 30–40)

## 2022-06-30 LAB — HEPARIN LEVEL (UNFRACTIONATED): Heparin Unfractionated: 0.26 IU/mL — ABNORMAL LOW (ref 0.30–0.70)

## 2022-06-30 LAB — ECHOCARDIOGRAM COMPLETE
Area-P 1/2: 5.02 cm2
Height: 70 in
S' Lateral: 2.4 cm
Weight: 3262.81 oz

## 2022-06-30 SURGERY — IRRIGATION AND DEBRIDEMENT WOUND
Anesthesia: General | Laterality: Right

## 2022-06-30 MED ORDER — 0.9 % SODIUM CHLORIDE (POUR BTL) OPTIME
TOPICAL | Status: DC | PRN
  Administered 2022-06-30: 2000 mL

## 2022-06-30 MED ORDER — ROCURONIUM BROMIDE 100 MG/10ML IV SOLN
INTRAVENOUS | Status: DC | PRN
  Administered 2022-06-30: 50 mg via INTRAVENOUS

## 2022-06-30 MED ORDER — SUGAMMADEX SODIUM 200 MG/2ML IV SOLN
INTRAVENOUS | Status: DC | PRN
  Administered 2022-06-30: 200 mg via INTRAVENOUS

## 2022-06-30 MED ORDER — DEXAMETHASONE SODIUM PHOSPHATE 10 MG/ML IJ SOLN
INTRAMUSCULAR | Status: DC | PRN
  Administered 2022-06-30: 10 mg via INTRAVENOUS

## 2022-06-30 MED ORDER — FENTANYL CITRATE (PF) 100 MCG/2ML IJ SOLN
INTRAMUSCULAR | Status: DC | PRN
  Administered 2022-06-30: 100 ug via INTRAVENOUS

## 2022-06-30 MED ORDER — LIDOCAINE HCL (PF) 1 % IJ SOLN
INTRAMUSCULAR | Status: AC
  Filled 2022-06-30: qty 30

## 2022-06-30 MED ORDER — HEPARIN BOLUS VIA INFUSION
1400.0000 [IU] | Freq: Once | INTRAVENOUS | Status: AC
  Administered 2022-06-30: 1400 [IU] via INTRAVENOUS
  Filled 2022-06-30: qty 1400

## 2022-06-30 MED ORDER — PROPOFOL 10 MG/ML IV BOLUS
INTRAVENOUS | Status: AC
  Filled 2022-06-30: qty 20

## 2022-06-30 MED ORDER — PROPOFOL 10 MG/ML IV BOLUS
INTRAVENOUS | Status: DC | PRN
  Administered 2022-06-30: 150 mg via INTRAVENOUS

## 2022-06-30 MED ORDER — LIDOCAINE-EPINEPHRINE 1 %-1:100000 IJ SOLN
INTRAMUSCULAR | Status: AC
  Filled 2022-06-30: qty 1

## 2022-06-30 MED ORDER — FENTANYL CITRATE (PF) 100 MCG/2ML IJ SOLN
INTRAMUSCULAR | Status: AC
  Filled 2022-06-30: qty 2

## 2022-06-30 MED ORDER — HEPARIN BOLUS VIA INFUSION
2700.0000 [IU] | Freq: Once | INTRAVENOUS | Status: AC
  Administered 2022-06-30: 2700 [IU] via INTRAVENOUS
  Filled 2022-06-30: qty 2700

## 2022-06-30 MED ORDER — SODIUM CHLORIDE 0.9 % IV SOLN: INTRAVENOUS | Status: DC

## 2022-06-30 MED ORDER — OXYCODONE HCL 5 MG/5ML PO SOLN: 5.0000 mg | Freq: Once | ORAL | Status: DC | PRN

## 2022-06-30 MED ORDER — MIDAZOLAM HCL 2 MG/2ML IJ SOLN
INTRAMUSCULAR | Status: AC
  Filled 2022-06-30: qty 2

## 2022-06-30 MED ORDER — SODIUM CHLORIDE 0.9 % IV SOLN
3.0000 g | Freq: Four times a day (QID) | INTRAVENOUS | Status: DC
  Administered 2022-06-30 – 2022-07-06 (×22): 3 g via INTRAVENOUS
  Filled 2022-06-30 (×25): qty 8

## 2022-06-30 MED ORDER — FENTANYL CITRATE (PF) 100 MCG/2ML IJ SOLN: 25.0000 ug | INTRAMUSCULAR | Status: DC | PRN

## 2022-06-30 MED ORDER — BUPIVACAINE HCL (PF) 0.5 % IJ SOLN
INTRAMUSCULAR | Status: AC
  Filled 2022-06-30: qty 30

## 2022-06-30 MED ORDER — VANCOMYCIN HCL 1000 MG IV SOLR
INTRAVENOUS | Status: AC
  Filled 2022-06-30: qty 20

## 2022-06-30 MED ORDER — PHENYLEPHRINE HCL (PRESSORS) 10 MG/ML IV SOLN
INTRAVENOUS | Status: DC | PRN
  Administered 2022-06-30 (×2): 160 ug via INTRAVENOUS

## 2022-06-30 MED ORDER — VANCOMYCIN HCL 1500 MG/300ML IV SOLN
1500.0000 mg | Freq: Two times a day (BID) | INTRAVENOUS | Status: AC
  Administered 2022-06-30 – 2022-07-05 (×11): 1500 mg via INTRAVENOUS
  Filled 2022-06-30 (×11): qty 300

## 2022-06-30 MED ORDER — BUPIVACAINE HCL 0.5 % IJ SOLN
INTRAMUSCULAR | Status: DC | PRN
  Administered 2022-06-30: 10 mL

## 2022-06-30 MED ORDER — LIDOCAINE HCL (CARDIAC) PF 100 MG/5ML IV SOSY
PREFILLED_SYRINGE | INTRAVENOUS | Status: DC | PRN
  Administered 2022-06-30: 100 mg via INTRAVENOUS

## 2022-06-30 MED ORDER — OXYCODONE HCL 5 MG PO TABS: 5.0000 mg | ORAL_TABLET | Freq: Once | ORAL | Status: DC | PRN

## 2022-06-30 SURGICAL SUPPLY — 31 items
BLADE SURG 15 STRL LF DISP TIS (BLADE) ×2 IMPLANT
BLADE SURG 15 STRL SS (BLADE) ×1
BNDG CMPR 5X4 KNIT ELC UNQ LF (GAUZE/BANDAGES/DRESSINGS) ×1
BNDG ELASTIC 4INX 5YD STR LF (GAUZE/BANDAGES/DRESSINGS) ×2 IMPLANT
BNDG GAUZE DERMACEA FLUFF 4 (GAUZE/BANDAGES/DRESSINGS) ×4 IMPLANT
BNDG GZE DERMACEA 4 6PLY (GAUZE/BANDAGES/DRESSINGS) ×1
COVER LIGHT HANDLE STERIS (MISCELLANEOUS) ×4 IMPLANT
GAUZE SPONGE 4X4 12PLY STRL (GAUZE/BANDAGES/DRESSINGS) ×2 IMPLANT
GAUZE XEROFORM 1X8 LF (GAUZE/BANDAGES/DRESSINGS) ×2 IMPLANT
GLOVE BIOGEL PI IND STRL 7.5 (GLOVE) ×2 IMPLANT
GOWN STRL REUS W/ TWL LRG LVL3 (GOWN DISPOSABLE) ×4 IMPLANT
GOWN STRL REUS W/ TWL XL LVL3 (GOWN DISPOSABLE) ×2 IMPLANT
GOWN STRL REUS W/TWL LRG LVL3 (GOWN DISPOSABLE) ×2
GOWN STRL REUS W/TWL MED LVL3 (GOWN DISPOSABLE) ×2 IMPLANT
GOWN STRL REUS W/TWL XL LVL3 (GOWN DISPOSABLE) ×1
GRAFT SKIN WND MICRO 38 (Tissue) IMPLANT
IV NS 1000ML (IV SOLUTION) ×1
IV NS 1000ML BAXH (IV SOLUTION) ×2 IMPLANT
KIT TURNOVER KIT A (KITS) ×2 IMPLANT
MANIFOLD NEPTUNE II (INSTRUMENTS) ×2 IMPLANT
NDL HYPO 25X1 1.5 SAFETY (NEEDLE) ×4 IMPLANT
NEEDLE HYPO 25X1 1.5 SAFETY (NEEDLE) ×2 IMPLANT
NS IRRIG 1000ML POUR BTL (IV SOLUTION) ×2 IMPLANT
PACK EXTREMITY ARMC (MISCELLANEOUS) ×2 IMPLANT
PAD PREP OB/GYN DISP 24X41 (PERSONAL CARE ITEMS) ×2 IMPLANT
SOL PREP PVP 2OZ (MISCELLANEOUS) ×1
SOLUTION PREP PVP 2OZ (MISCELLANEOUS) ×2 IMPLANT
SUT ETHILON 2 0 FS 18 (SUTURE) ×4 IMPLANT
SYR 10ML LL (SYRINGE) ×2 IMPLANT
SYR 3ML LL SCALE MARK (SYRINGE) ×2 IMPLANT
WATER STERILE IRR 500ML POUR (IV SOLUTION) ×2 IMPLANT

## 2022-06-30 NOTE — Consult Note (Signed)
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   No Known Allergies  Patient Measurements: Height: 5\' 10"  (177.8 cm) Weight: 92.5 kg (203 lb 14.8 oz) IBW/kg (Calculated) : 73 Heparin Dosing Weight: 91.6 kg  Vital Signs: Temp: 98.2 F (36.8 C) (05/16 1400) Temp Source: Temporal (05/16 1129) BP: 102/68 (05/16 1400) Pulse Rate: 85 (05/16 1400)  Labs: Recent Labs    06/28/22 0559 06/28/22 1116 06/28/22 1913 06/29/22 0250 06/29/22 1240 06/29/22 1743 06/30/22 0203 06/30/22 1006  HGB 14.9  --   --  13.9  --   --  13.4  --   HCT 44.3  --   --  39.5  --   --  39.2  --   PLT 277  --   --  286  --   --  283  --   APTT  --  32   < > 40*  --  40* 52* 66*  LABPROT  --  15.8*  --   --   --   --   --   --   INR  --  1.2  --   --   --   --   --   --   HEPARINUNFRC  --   --   --  0.26*  --   --  0.26*  --   CREATININE 0.87  --   --   --  0.83  --  0.82  --    < > = values in this interval not displayed.     Estimated Creatinine Clearance: 134.1 mL/min (by C-G formula based on SCr of 0.82 mg/dL).   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   Chicken pox    Coronary artery disease    Inferior ST elevation myocardial infarction in July of 2014. Cardiac catheterization showed 95% distal RCA stenosis and 90% first diagonal stenosis with normal ejection fraction. He underwent PCI in bare-metal stent placement to the distal RCA.   Diabetes mellitus without complication (HCC)    Hypercholesterolemia    Stroke (HCC)    Tobacco use     Pertinent Medications:  PTA: Apixaban 5mg  BID, last dose: 5/13@2115   Assessment: SO is a 42 y.o. 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  He presented to the hospital because of pain in the right leg. Plan for right lower extremity angiogram tomorrow, 5/15. Will hold apixaban and start patient no heparin infusion.  Baseline labs: aPTT 32 sec, INR 1.2, Plts 277, Hgb 14.9  5/14 1913 aPTT 32, Subtherapeutic  5/15 0250 aPTT 40, subtherapeutic, HL 0.26 5/15 1743 aPTT 40, subtherapeutic  5/16 0203 aPTT 52, subtherapeutic, HL0.26 5/16 1006 aPTT 66, therapeutic x 1  Goal of Therapy:  Heparin level 0.3-0.7 units/ml aPTT 66-102 seconds Monitor platelets by anticoagulation protocol: Yes   Plan:  Heparin therapeutic x 1  Continue heparin infusion at 2400 units/hr Check confirmatory aPTT in 6 hours Will use aPTT to titrate dose until levels correlate, then will transition to anti-Xa dosing. Check anti-Xa daily. Continue to monitor H&H and platelets   Bettey Costa, PharmD Clinical Pharmacist   06/30/2022 2:57 PM

## 2022-06-30 NOTE — Anesthesia Procedure Notes (Signed)
Procedure Name: Intubation Date/Time: 06/30/2022 12:42 PM  Performed by: Danelle Berry, CRNAPre-anesthesia Checklist: Patient identified, Emergency Drugs available, Suction available and Patient being monitored Patient Re-evaluated:Patient Re-evaluated prior to induction Oxygen Delivery Method: Circle system utilized Preoxygenation: Pre-oxygenation with 100% oxygen Induction Type: IV induction Ventilation: Mask ventilation without difficulty Tube type: Oral Tube size: 7.5 mm Number of attempts: 1 Airway Equipment and Method: Stylet and Oral airway Placement Confirmation: ETT inserted through vocal cords under direct vision, positive ETCO2 and breath sounds checked- equal and bilateral Tube secured with: Tape Dental Injury: Teeth and Oropharynx as per pre-operative assessment

## 2022-06-30 NOTE — Anesthesia Postprocedure Evaluation (Signed)
Anesthesia Post Note  Patient: Martin Mullen  Procedure(s) Performed: IRRIGATION AND DEBRIDEMENT WOUND WITH CLOSURE (Right)  Patient location during evaluation: PACU Anesthesia Type: General Level of consciousness: awake and alert Pain management: pain level controlled Vital Signs Assessment: post-procedure vital signs reviewed and stable Respiratory status: spontaneous breathing, nonlabored ventilation, respiratory function stable and patient connected to nasal cannula oxygen Cardiovascular status: blood pressure returned to baseline and stable Postop Assessment: no apparent nausea or vomiting Anesthetic complications: no  No notable events documented.   Last Vitals:  Vitals:   06/30/22 1330 06/30/22 1336  BP: 108/69   Pulse: 87 84  Resp: 12 12  Temp:    SpO2: 95% 94%    Last Pain:  Vitals:   06/30/22 1129  TempSrc: Temporal  PainSc:                  Stephanie Coup

## 2022-06-30 NOTE — Interval H&P Note (Signed)
History and Physical Interval Note:  06/30/2022 11:36 AM  Martin Mullen  has presented today for surgery, with the diagnosis of osteomyelitis.  The various methods of treatment have been discussed with the patient and family. After consideration of risks, benefits and other options for treatment, the patient has consented to  Procedure(s): IRRIGATION AND DEBRIDEMENT WOUND (Right) AMPUTATION RAY (Right) APPLICATION OF WOUND VAC (Right) as a surgical intervention.  The patient's history has been reviewed, patient examined, no change in status, stable for surgery.  I have reviewed the patient's chart and labs.  Questions were answered to the patient's satisfaction.     Candelaria Stagers

## 2022-06-30 NOTE — Anesthesia Preprocedure Evaluation (Signed)
Anesthesia Evaluation  Patient identified by MRN, date of birth, ID band Patient awake    Reviewed: Allergy & Precautions, H&P , NPO status , Patient's Chart, lab work & pertinent test results  History of Anesthesia Complications Negative for: history of anesthetic complications  Airway Mallampati: I  TM Distance: >3 FB Neck ROM: Full    Dental  (+) Edentulous Upper, Edentulous Lower, Dental Advidsory Given   Pulmonary neg shortness of breath, neg COPD, neg recent URI, Current Smoker and Patient abstained from smoking.   Pulmonary exam normal        Cardiovascular hypertension, (-) angina + CAD, + Past MI and + Cardiac Stents (2014)  (-) CABG negative cardio ROS Normal cardiovascular exam(-) dysrhythmias (-) Valvular Problems/Murmurs  Study Conclusions   - Left ventricle: The cavity size was normal. There was mild  concentric hypertrophy. Systolic function was normal. The  estimated ejection fraction was in the range of 55% to 60%. Wall  motion was normal; there were no regional wall motion  abnormalities.  - Left atrium: The atrium was mildly dilated.    Neuro/Psych  PSYCHIATRIC DISORDERS      negative neurological ROS  negative psych ROS   GI/Hepatic negative GI ROS, Neg liver ROS,GERD  ,,  Endo/Other  diabetes, Poorly Controlled    Renal/GU negative Renal ROS  negative genitourinary   Musculoskeletal   Abdominal   Peds  Hematology negative hematology ROS (+)   Anesthesia Other Findings Patient took his ozempic on 5/10.   Past Medical History: 08/28/2012: Acute transmural inferior wall MI (HCC)     Comment:  .5 x 12 mm Veri-flex stent non-DES. No date: Chicken pox No date: Coronary artery disease     Comment:  Inferior ST elevation myocardial infarction in July of               2014. Cardiac catheterization showed 95% distal RCA               stenosis and 90% first diagonal stenosis with normal                ejection fraction. He underwent PCI in bare-metal stent               placement to the distal RCA. No date: Diabetes mellitus without complication (HCC) No date: Hypercholesterolemia No date: Stroke [facility]) No date: Tobacco use  Past Surgical History: No date: CORONARY ANGIOPLASTY WITH STENT PLACEMENT 09/03/2019: DEBRIDEMENT AND CLOSURE WOUND; N/A     Comment:  Procedure: Debridement and closure of scrotal wound;                Surgeon: Allena Napoleon, MD;  Location: [facility];  Service: Plastics;  Laterality: N/A; 07/25/2019: DEBRIDEMENT AND CLOSURE WOUND; N/A     Comment:  Procedure: DEBRIDEMENT AND CLOSURE WOUND;  Surgeon:               Allena Napoleon, MD;  Location: [facility];  Service:               Plastics;  Laterality: N/A; 07/20/2019: INCISION AND DRAINAGE ABSCESS; N/A     Comment:  Procedure: INCISION AND DRAINAGE ABSCESS;  Surgeon:               Crista Elliot, MD;  Location: [facility];  Service:  Urology;  Laterality: N/A; 08/28/2012: LEFT HEART CATHETERIZATION WITH CORONARY ANGIOGRAM; N/A     Comment:  Procedure: LEFT HEART CATHETERIZATION WITH CORONARY               ANGIOGRAM;  Surgeon: Pamella Pert, MD;  Location: [facility] [place]              CATH LAB;  Service: Cardiovascular;  Laterality: N/A; 09/23/2020: TEE WITHOUT CARDIOVERSION; N/A     Comment:  Procedure: TRANSESOPHAGEAL ECHOCARDIOGRAM (TEE);                Surgeon: Lamar Blinks, MD;  Location: [facility];                Service: Cardiovascular;  Laterality: N/A; 1988: WRIST SURGERY  BMI    Body Mass Index: 29.26 kg/m      Reproductive/Obstetrics negative OB ROS                             Anesthesia Physical Anesthesia Plan  ASA: III  Anesthesia Plan: General   Post-op Pain Management:    Induction: Intravenous  PONV Risk Score and Plan: 3 and Ondansetron, Dexamethasone, Midazolam, Propofol infusion and TIVA  Airway  Management Planned: Oral ETT  Additional Equipment:   Intra-op Plan:   Post-operative Plan: Extubation in OR  Informed Consent: I have reviewed the patients History and Physical, chart, labs and discussed the procedure including the risks, benefits and alternatives for the proposed anesthesia with the patient or authorized representative who has indicated his/her understanding and acceptance.     Dental Advisory Given  Plan Discussed with: Anesthesiologist and CRNA  Anesthesia Plan Comments: (Patient consented for risks of anesthesia including but not limited to:  - adverse reactions to medications - risk of airway placement if required - damage to eyes, teeth, lips or other oral mucosa - nerve damage due to positioning  - sore throat or hoarseness - Damage to heart, brain, nerves, lungs, other parts of body or loss of life  Patient voiced understanding.)        Anesthesia Quick Evaluation

## 2022-06-30 NOTE — Progress Notes (Signed)
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 S/p washout I&D of right foot infection on 06/27/2022. Will go for debridement, graft & VAC application 06/30/22 as per podiatry. Continue on IV cefepime, vanco as per ID.  Podiatry & ID following and recs apprec. See Dr. Helayne Seminole notes on how pt met sepsis criteria. Sepsis resolved    MRSA bacteremia: continue on IV cefepime, vanco as per ID. Cardio consulted for TEE to r/o endocarditis    Hypokalemia: WNL today   Hyponatremia: labile. Will continue to monitor    Hx of CAD: w/ bare-metal stent to distal RCA in 2014. Continue on statin, aspirin   PVD: s/p RLE angiogram in which angioplasty of right popliteal artery, right tibioperoneal trunk, posterior tibial artery was done & stent placed into the right popliteal artery on 06/29/22. Continue on statin, aspirin    Heterozygous for prothrombin gene 20210A mutation: w/ recurrent strokes. Holding  eliquis. Continue on IV heparin drip    DMII: HbA1c 8.2, poorly controlled. Continue on glargine, SSI w/ accuchecks   Overweight: BMI 29.2. Would benefit from weight loss     DVT prophylaxis: IV heparin  Code Status: full  Family Communication:  Disposition Plan: likely d/c back home  Level of care: Telemetry Medical  Status is: Inpatient Remains inpatient appropriate because: severity of illness    Consultants:  Vasc surg ID   Procedures:   Antimicrobials: cefepime, vanco   Subjective: Pt c/o fatigue   Objective: Vitals:   06/29/22 2002 06/30/22 0400 06/30/22 0528 06/30/22 0755  BP: 122/84 138/86 115/86 132/88  Pulse: 97 100 97 92  Resp: 17 14 15 18   Temp: 99.8 F (37.7 C) 99.2 F (37.3 C) 99.1 F (37.3 C) 98.3 F (36.8 C)  TempSrc: Oral Oral Oral   SpO2: 97% 97% 97% 95%  Weight:      Height:        Intake/Output Summary (Last 24 hours) at 06/30/2022 0803 Last data filed at 06/30/2022 0200 Gross per 24 hour  Intake --  Output 800 ml  Net -800 ml   Filed Weights   06/26/22 1838  Weight: 92.5 kg    Examination:  General exam: Appears comfortable  Respiratory system: clear breath sounds b/l  Cardiovascular system: S1/S2+. No rubs or gallops  Gastrointestinal system: Abd is soft, NT, ND & hypoactive bowel sounds  Central nervous system: Awake. Moves all extremities  Psychiatry: Judgement and insight appears at baseline. Flat mood and affect  Data Reviewed: I have personally reviewed following labs and imaging studies  CBC: Recent Labs  Lab 06/26/22 1956 06/27/22 0511 06/28/22 0559 06/29/22 0250 06/30/22 0203  WBC 23.7* 22.2* 17.1* 18.0* 15.0*  NEUTROABS 20.2*  --  12.9*  --   --   HGB 15.6 14.4 14.9 13.9 13.4  HCT 45.5 42.2 44.3 39.5 39.2  MCV 89.9 90.9 90.8 87.8 88.3  PLT 321 263 277 286 283   Basic Metabolic Panel: Recent Labs  Lab 06/26/22 1956 06/27/22 0511 06/28/22 0559 06/29/22 1240 06/30/22 0203  NA 133* 131* 135  133* 131*  K 3.4* 3.4* 3.6 4.0 4.1  CL 95* 99 101 100 98  CO2 27 21* 24 23 23   GLUCOSE 293* 222* 153* 127* 198*  BUN 10 9 8 9 9   CREATININE 0.94 0.82 0.87 0.83 0.82  CALCIUM 8.8* 8.0* 8.4* 8.2* 8.4*  MG  --   --  1.8  --   --    GFR: Estimated Creatinine Clearance: 134.1 mL/min (by C-G formula based on SCr of 0.82 mg/dL). Liver Function Tests: Recent Labs  Lab 06/26/22 1956  AST 13*  ALT 8  ALKPHOS 125  BILITOT 1.0  PROT 7.8  ALBUMIN 3.4*   No results for input(s): "LIPASE", "AMYLASE" in the last 168 hours. No results for input(s): "AMMONIA" in the last 168 hours. Coagulation Profile: Recent Labs  Lab 06/27/22 0511 06/28/22 1116  INR 1.3* 1.2   Cardiac Enzymes: No results for input(s): "CKTOTAL", "CKMB", "CKMBINDEX", "TROPONINI" in the last 168 hours. BNP (last 3 results) No results for input(s): "PROBNP" in the last 8760 hours. HbA1C: No results for input(s): "HGBA1C" in the last 72 hours. CBG: Recent Labs  Lab 06/29/22 1013 06/29/22 1159 06/29/22 1652 06/29/22 2136 06/30/22 0757  GLUCAP 135* 117* 172* 234* 169*   Lipid Profile: No results for input(s): "CHOL", "HDL", "LDLCALC", "TRIG", "CHOLHDL", "LDLDIRECT" in the last 72 hours. Thyroid Function Tests: No results for input(s): "TSH", "T4TOTAL", "FREET4", "T3FREE", "THYROIDAB" in the last 72 hours. Anemia Panel: No results for input(s): "VITAMINB12", "FOLATE", "FERRITIN", "TIBC", "IRON", "RETICCTPCT" in the last 72 hours. Sepsis Labs: Recent Labs  Lab 06/26/22 1956 06/26/22 2319 06/27/22 0511  PROCALCITON  --   --  0.46  LATICACIDVEN 2.8* 1.4  --     Recent Results (from the past 240 hour(s))  Blood culture (single)     Status: Abnormal   Collection Time: 06/26/22  7:56 PM   Specimen: BLOOD  Result Value Ref Range Status   Specimen Description   Final    BLOOD RIGHT ANTECUBITAL Performed at [facility], [address], [place], [place] [address]    Special Requests   Final     BOTTLES DRAWN AEROBIC AND ANAEROBIC Blood Culture adequate volume Performed at [facility], [address] [place], [place] [address]    Culture  Setup Time   Final    GRAM POSITIVE COCCI AEROBIC BOTTLE ONLY CRITICAL RESULT CALLED TO, READ BACK BY AND VERIFIED WITH: [facility] MITCHELL 06/27/2022 1124 CP Performed at [facility], [address] [place], [place] [address]    Culture METHICILLIN RESISTANT STAPHYLOCOCCUS AUREUS (A)  Final   Report Status 06/29/2022 FINAL  Final   Organism ID, Bacteria METHICILLIN RESISTANT STAPHYLOCOCCUS AUREUS  Final      Susceptibility   Methicillin resistant staphylococcus aureus - MIC*    CIPROFLOXACIN <=0.5 SENSITIVE Sensitive     ERYTHROMYCIN 0.5 SENSITIVE Sensitive     GENTAMICIN <=0.5  SENSITIVE Sensitive     OXACILLIN >=4 RESISTANT Resistant     TETRACYCLINE >=16 RESISTANT Resistant     VANCOMYCIN 1 SENSITIVE Sensitive     TRIMETH/SULFA <=10 SENSITIVE Sensitive     CLINDAMYCIN <=0.25 SENSITIVE Sensitive     RIFAMPIN <=0.5 SENSITIVE Sensitive     Inducible Clindamycin NEGATIVE Sensitive     LINEZOLID 2 SENSITIVE Sensitive     * METHICILLIN RESISTANT STAPHYLOCOCCUS AUREUS  Blood Culture ID Panel (Reflexed)     Status: Abnormal   Collection Time: 06/26/22  7:56 PM  Result Value Ref Range Status   Enterococcus faecalis NOT DETECTED NOT DETECTED Final   Enterococcus Faecium NOT DETECTED NOT DETECTED Final   Listeria monocytogenes NOT DETECTED NOT DETECTED Final   Staphylococcus species DETECTED (A) NOT DETECTED Final    Comment: CRITICAL RESULT CALLED TO, READ BACK BY AND VERIFIED WITH: DEVAN MITCHELL 06/27/2022 1124 CP    Staphylococcus aureus (BCID) DETECTED (A) NOT DETECTED Final    Comment: Methicillin (oxacillin)-resistant Staphylococcus aureus (MRSA). MRSA is predictably resistant to beta-lactam antibiotics (except ceftaroline). Preferred therapy is vancomycin unless clinically contraindicated. Patient requires contact  precautions if  hospitalized. CRITICAL RESULT CALLED TO, READ BACK BY AND VERIFIED WITH: DEVAN MITCHELL 06/27/2022 1124 CP    Staphylococcus epidermidis NOT DETECTED NOT DETECTED Final   Staphylococcus lugdunensis NOT DETECTED NOT DETECTED Final   Streptococcus species NOT DETECTED NOT DETECTED Final   Streptococcus agalactiae NOT DETECTED NOT DETECTED Final   Streptococcus pneumoniae NOT DETECTED NOT DETECTED Final   Streptococcus pyogenes NOT DETECTED NOT DETECTED Final   A.calcoaceticus-baumannii NOT DETECTED NOT DETECTED Final   Bacteroides fragilis NOT DETECTED NOT DETECTED Final   Enterobacterales NOT DETECTED NOT DETECTED Final   Enterobacter cloacae complex NOT DETECTED NOT DETECTED Final   Escherichia coli NOT DETECTED NOT DETECTED Final   Klebsiella aerogenes NOT DETECTED NOT DETECTED Final   Klebsiella oxytoca NOT DETECTED NOT DETECTED Final   Klebsiella pneumoniae NOT DETECTED NOT DETECTED Final   Proteus species NOT DETECTED NOT DETECTED Final   Salmonella species NOT DETECTED NOT DETECTED Final   Serratia marcescens NOT DETECTED NOT DETECTED Final   Haemophilus influenzae NOT DETECTED NOT DETECTED Final   Neisseria meningitidis NOT DETECTED NOT DETECTED Final   Pseudomonas aeruginosa NOT DETECTED NOT DETECTED Final   Stenotrophomonas maltophilia NOT DETECTED NOT DETECTED Final   Candida albicans NOT DETECTED NOT DETECTED Final   Candida auris NOT DETECTED NOT DETECTED Final   Candida glabrata NOT DETECTED NOT DETECTED Final   Candida krusei NOT DETECTED NOT DETECTED Final   Candida parapsilosis NOT DETECTED NOT DETECTED Final   Candida tropicalis NOT DETECTED NOT DETECTED Final   Cryptococcus neoformans/gattii NOT DETECTED NOT DETECTED Final   Meth resistant mecA/C and MREJ DETECTED (A) NOT DETECTED Final    Comment: CRITICAL RESULT CALLED TO, READ BACK BY AND VERIFIED WITH: [facility] MITCHELL 06/27/2022 1124 CP Performed at [facility], [address]  [place], [place] [address]   Blood culture (single)     Status: Abnormal   Collection Time: 06/26/22  8:04 PM   Specimen: BLOOD  Result Value Ref Range Status   Specimen Description   Final    BLOOD LEFT ANTECUBITAL Performed at [facility], [address] [place], [place] [address]    Special Requests   Final    BOTTLES DRAWN AEROBIC AND ANAEROBIC Blood Culture adequate volume Performed at [facility], [address], [place], [place] [address]  Culture  Setup Time   Final    GRAM POSITIVE COCCI AEROBIC BOTTLE ONLY Organism ID to follow CRITICAL RESULT CALLED TO, READ BACK BY AND VERIFIED WITH: [email or web address]  06/28/22 RH Performed at [facility], [address] [place], [place] [address]    Culture (A)  Final    STAPHYLOCOCCUS AUREUS SUSCEPTIBILITIES PERFORMED ON PREVIOUS CULTURE WITHIN THE LAST 5 DAYS. Performed at [facility], [address], [place], [place] [address]    Report Status 06/29/2022 FINAL  Final  Blood Culture ID Panel (Reflexed)     Status: Abnormal   Collection Time: 06/26/22  8:04 PM  Result Value Ref Range Status   Enterococcus faecalis NOT DETECTED NOT DETECTED Final   Enterococcus Faecium NOT DETECTED NOT DETECTED Final   Listeria monocytogenes NOT DETECTED NOT DETECTED Final   Staphylococcus species DETECTED (A) NOT DETECTED Final    Comment: CRITICAL RESULT CALLED TO, READ BACK BY AND VERIFIED WITH: [email or web address]  06/28/22 RH    Staphylococcus aureus (BCID) DETECTED (A) NOT DETECTED Final    Comment: Methicillin (oxacillin)-resistant Staphylococcus aureus (MRSA). MRSA is predictably resistant to beta-lactam antibiotics (except ceftaroline). Preferred therapy is vancomycin unless clinically contraindicated. Patient requires contact precautions if  hospitalized. CRITICAL RESULT CALLED TO, READ BACK BY AND VERIFIED WITH: [email or web address]  06/28/22 RH    Staphylococcus epidermidis NOT DETECTED NOT DETECTED Final    Staphylococcus lugdunensis NOT DETECTED NOT DETECTED Final   Streptococcus species NOT DETECTED NOT DETECTED Final   Streptococcus agalactiae NOT DETECTED NOT DETECTED Final   Streptococcus pneumoniae NOT DETECTED NOT DETECTED Final   Streptococcus pyogenes NOT DETECTED NOT DETECTED Final   A.calcoaceticus-baumannii NOT DETECTED NOT DETECTED Final   Bacteroides fragilis NOT DETECTED NOT DETECTED Final   Enterobacterales NOT DETECTED NOT DETECTED Final   Enterobacter cloacae complex NOT DETECTED NOT DETECTED Final   Escherichia coli NOT DETECTED NOT DETECTED Final   Klebsiella aerogenes NOT DETECTED NOT DETECTED Final   Klebsiella oxytoca NOT DETECTED NOT DETECTED Final   Klebsiella pneumoniae NOT DETECTED NOT DETECTED Final   Proteus species NOT DETECTED NOT DETECTED Final   Salmonella species NOT DETECTED NOT DETECTED Final   Serratia marcescens NOT DETECTED NOT DETECTED Final   Haemophilus influenzae NOT DETECTED NOT DETECTED Final   Neisseria meningitidis NOT DETECTED NOT DETECTED Final   Pseudomonas aeruginosa NOT DETECTED NOT DETECTED Final   Stenotrophomonas maltophilia NOT DETECTED NOT DETECTED Final   Candida albicans NOT DETECTED NOT DETECTED Final   Candida auris NOT DETECTED NOT DETECTED Final   Candida glabrata NOT DETECTED NOT DETECTED Final   Candida krusei NOT DETECTED NOT DETECTED Final   Candida parapsilosis NOT DETECTED NOT DETECTED Final   Candida tropicalis NOT DETECTED NOT DETECTED Final   Cryptococcus neoformans/gattii NOT DETECTED NOT DETECTED Final   Meth resistant mecA/C and MREJ DETECTED (A) NOT DETECTED Corrected    Comment: CRITICAL RESULT CALLED TO, READ BACK BY AND VERIFIED WITH: [email or web address]  06/28/22 RH Performed at [facility], [address] [place] [place], [place] [address] CORRECTED ON 05/14 AT 0802: PREVIOUSLY REPORTED AS DETECTED [email or web address]  06/28/22 RH   Aerobic/Anaerobic Culture w Gram Stain (surgical/deep wound)     Status: None  (Preliminary result)   Collection Time: 06/27/22  9:09 AM   Specimen: Wound; Tissue  Result Value Ref Range Status   Specimen Description   Final    BONE Performed at [facility], [address], [place], [place] [address]    Special  Requests   Final    NONE Performed at [facility], [address] [place], [place] [address]    Gram Stain   Final    NO WBC SEEN NO ORGANISMS SEEN Performed at [facility], [address] [place], [place] [address]    Culture   Final    FEW STAPHYLOCOCCUS AUREUS SUSCEPTIBILITIES TO FOLLOW FEW GRAM NEGATIVE RODS IDENTIFICATION AND SUSCEPTIBILITIES TO FOLLOW NO ANAEROBES ISOLATED; CULTURE IN PROGRESS FOR 5 DAYS    Report Status PENDING  Incomplete  Culture, blood (Routine X 2) w Reflex to ID Panel     Status: None (Preliminary result)   Collection Time: 06/29/22  2:50 AM   Specimen: BLOOD  Result Value Ref Range Status   Specimen Description BLOOD RIGHT HAND  Final   Special Requests   Final    BOTTLES DRAWN AEROBIC AND ANAEROBIC Blood Culture adequate volume   Culture   Final    NO GROWTH 1 DAY Performed at [facility], [address], [place], [place] [address]    Report Status PENDING  Incomplete  Culture, blood (Routine X 2) w Reflex to ID Panel     Status: None (Preliminary result)   Collection Time: 06/29/22  2:50 AM   Specimen: BLOOD  Result Value Ref Range Status   Specimen Description BLOOD RIGHT ARM  Final   Special Requests   Final    BOTTLES DRAWN AEROBIC AND ANAEROBIC Blood Culture adequate volume   Culture   Final    NO GROWTH 1 DAY Performed at [facility], [address], [place], [place] [address]    Report Status PENDING  Incomplete         Radiology Studies: PERIPHERAL VASCULAR CATHETERIZATION  Result Date: 06/29/2022 See surgical note for result.       Scheduled Meds:  aspirin EC  81 mg Oral Daily   buPROPion  150 mg Oral Daily   insulin aspart  0-15  Units Subcutaneous TID WC   insulin aspart  0-5 Units Subcutaneous QHS   insulin glargine-yfgn  16 Units Subcutaneous Daily   losartan  25 mg Oral Daily   nicotine  21 mg Transdermal Daily   polyethylene glycol  17 g Oral Daily   rosuvastatin  20 mg Oral Daily   Continuous Infusions:  ceFEPime (MAXIPIME) IV 2 g (06/30/22 0411)   heparin 2,400 Units/hr (06/30/22 0438)   vancomycin 1,500 mg (06/29/22 2200)     LOS: 4 days    Time spent: 30 mins     Charise Killian, MD [facility] Pager [phone]  If 7PM-7AM, please contact night-coverage [email or web address] 06/30/2022, 8:03 AM

## 2022-06-30 NOTE — Progress Notes (Signed)
Progress Note    06/30/2022 10:08 AM 1 Day Post-Op  Subjective:  This is a 42 y.o. male  presents to [facility] ER with medical history significant for STEMI s/p RCA stent 2014, hypertension, recurrent strokes, heterozygous prothrombin gene mutation on Eliquis, insulin requiring type 2 diabetes insulin-dependent type 2 diabetes poorly controlled, history of Fournier's gangrene, who presents to the ED with right lower extremity pain most notably in the right calf starting on the day of arrival, without swelling.  He denies any injury.   Patient is now POD #1 from  Aortogram and selective right lower extremity angiogram with angioplasty and stent placement to the right popliteal artery. Patient endorses right lower extremity feels better. He is also Post operative this morning by Podiatry for I&D. Patient denies chest pain or shortness of breath. Denies any pain to his foot.    Vitals:   06/30/22 0528 06/30/22 0755  BP: 115/86 132/88  Pulse: 97 92  Resp: 15 18  Temp: 99.1 F (37.3 C) 98.3 F (36.8 C)  SpO2: 97% 95%   Physical Exam: Cardiac:  RRR, Normal S1 and S2, No murmurs Lungs:  Clear on auscultation throughout, no rales, rhonchi or wheezing.  Incisions:  Left groin with dressing clean dry and intact. Right foot with debridement but wrapped today. I did not take down to assess. Pictures in media.  Extremities:  Bilateral lower extremities with palpable PT pulses.  Abdomen:  Positive bowel sounds, soft, non tender, non distended Neurologic: AAOX3, follows commands appropriately.   CBC    Component Value Date/Time   WBC 15.0 (H) 06/30/2022 0203   RBC 4.44 06/30/2022 0203   HGB 13.4 06/30/2022 0203   HGB 17.9 (H) 08/11/2021 0937   HCT 39.2 06/30/2022 0203   HCT 51.0 08/11/2021 0937   PLT 283 06/30/2022 0203   PLT 261 08/11/2021 0937   MCV 88.3 06/30/2022 0203   MCV 93 08/11/2021 0937   MCV 93 01/29/2013 0040   MCH 30.2 06/30/2022 0203   MCHC 34.2 06/30/2022 0203   RDW 12.3  06/30/2022 0203   RDW 12.4 08/11/2021 0937   RDW 13.8 01/29/2013 0040   LYMPHSABS 2.4 06/28/2022 0559   LYMPHSABS 4.8 (H) 08/11/2021 0937   MONOABS 1.2 (H) 06/28/2022 0559   EOSABS 0.4 06/28/2022 0559   EOSABS 0.5 (H) 08/11/2021 0937   BASOSABS 0.1 06/28/2022 0559   BASOSABS 0.1 08/11/2021 0937    BMET    Component Value Date/Time   NA 131 (L) 06/30/2022 0203   NA 140 08/11/2021 0937   NA 139 01/29/2013 0040   K 4.1 06/30/2022 0203   K 4.0 01/29/2013 0040   CL 98 06/30/2022 0203   CL 107 01/29/2013 0040   CO2 23 06/30/2022 0203   CO2 28 01/29/2013 0040   GLUCOSE 198 (H) 06/30/2022 0203   GLUCOSE 228 (H) 01/29/2013 0040   BUN 9 06/30/2022 0203   BUN 11 08/11/2021 0937   BUN 15 01/29/2013 0040   CREATININE 0.82 06/30/2022 0203   CREATININE 1.03 05/10/2022 1501   CALCIUM 8.4 (L) 06/30/2022 0203   CALCIUM 9.2 01/29/2013 0040   GFRNONAA >60 06/30/2022 0203   GFRNONAA >60 01/29/2013 0040   GFRAA 104 11/13/2019 1550   GFRAA >60 01/29/2013 0040    INR    Component Value Date/Time   INR 1.2 06/28/2022 1116     Intake/Output Summary (Last 24 hours) at 06/30/2022 1008 Last data filed at 06/30/2022 0851 Gross per 24 hour  Intake --  Output 1300 ml  Net -1300 ml     Assessment/Plan:  42 y.o. male is s/p Aortogram and selective right lower extremity angiogram with angioplasty and stent placement to the right popliteal artery 1 Day Post-Op   PLAN: Per vascular surgery patient now has adequate blood flow for any further procedures to his right lower extremity and healing postop from podiatry's I&D.  Will need to be transferred back to his Eliquis 5 mg twice daily and aspirin 81 mg once daily when podiatry feels this is appropriate.  Patient continues on heparin infusion until then. Patient can have diabetic diet. Pain control as needed.  DVT prophylaxis:  Heparin Infusion   Marcie [facility] 06/30/2022 10:08 AM

## 2022-06-30 NOTE — Consult Note (Signed)
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   No Known Allergies  Patient Measurements: Height: 5\' 10"  (177.8 cm) Weight: 92.5 kg (203 lb 14.8 oz) IBW/kg (Calculated) : 73 Heparin Dosing Weight: 91.6 kg  Vital Signs: Temp: 98.2 F (36.8 C) (05/16 1400) Temp Source: Temporal (05/16 1129) BP: 102/68 (05/16 1400) Pulse Rate: 85 (05/16 1400)  Labs: Recent Labs    06/28/22 0559 06/28/22 1116 06/28/22 1913 06/29/22 0250 06/29/22 1240 06/29/22 1743 06/30/22 0203 06/30/22 1006  HGB 14.9  --   --  13.9  --   --  13.4  --   HCT 44.3  --   --  39.5  --   --  39.2  --   PLT 277  --   --  286  --   --  283  --   APTT  --  32   < > 40*  --  40* 52* 66*  LABPROT  --  15.8*  --   --   --   --   --   --   INR  --  1.2  --   --   --   --   --   --   HEPARINUNFRC  --   --   --  0.26*  --   --  0.26*  --   CREATININE 0.87  --   --   --  0.83  --  0.82  --    < > = values in this interval not displayed.     Estimated Creatinine Clearance: 134.1 mL/min (by C-G formula based on SCr of 0.82 mg/dL).   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   Chicken pox    Coronary artery disease    Inferior ST elevation myocardial infarction in July of 2014. Cardiac catheterization showed 95% distal RCA stenosis and 90% first diagonal stenosis with normal ejection fraction. He underwent PCI in bare-metal stent placement to the distal RCA.   Diabetes mellitus without complication (HCC)    Hypercholesterolemia    Stroke (HCC)    Tobacco use     Pertinent Medications:  PTA: Apixaban 5mg  BID, last dose: 5/13@2115   Assessment: SO is a 42 y.o. 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  He presented to the hospital because of pain in the right leg. Plan for right lower extremity angiogram tomorrow, 5/15. Will hold apixaban and start patient no heparin infusion.  Baseline labs: aPTT 32 sec, INR 1.2, Plts 277, Hgb 14.9  Goal of Therapy:  Heparin level 0.3-0.7 units/ml aPTT 66-102 seconds Monitor platelets by anticoagulation protocol: Yes   Plan:  aPTT subtherapeutic  Bolus 1400 units IV heparin then increase heparin infusion rate to 2600 units/hr Check aPTT in 6 hours after rate change Will use aPTT to titrate dose until levels correlate, then will transition to anti-Xa dosing. Check anti-Xa daily. Continue to monitor H&H and platelets   Lowella Bandy, PharmD Clinical Pharmacist   06/30/2022 3:20 PM

## 2022-06-30 NOTE — Progress Notes (Signed)
Pharmacy Antibiotic Note  Martin Mullen is a 42 y.o. male admitted on 06/26/2022 with MRSA bacteremia from diabetic foot ulcer and osteomyelitis.  Pharmacy has been consulted for Vancomycin and Cefepime  dosing He is s/p I&D and right 2nd toe amputation on 5/13  Today, 06/30/2022 Day #4 vancomycin and cefepime  Renal: SCr stable and WNL WBC 15 Afebile 5/12 blood cx: 2/2 sets MRSA 5/15 repeat Bcx NGTD 5/13 Bone: MRSA (susc bactrim, linezolid), E coli (Pan-susc) ECHO 5/14 - no vegetation Planning for TEE Angioplasty and stent 5/15  Vancomycin Levels: On vancomycin 1500mg  IV q12h (dose given 5/15 at 22:00) Vancomycin peak 5/16 at02:03 = 24 mcg/mL Vancomycin trough 5/16 at 10:06 = 13 mcg/mL AUC = 475.8, Cmax = 28.1 Cmin = 13.1  Plan: Continue Cefepime 2 gm IV Q8H - anticipate de-escalation based on E coli results Continue Vancomycin 1500 mg IV Q12H (goal AUC 400-600) AUC at goal per levels 5/16 (as above) Await cultures and antibiotic plan Follow renal function   Height: 5\' 10"  (177.8 cm) Weight: 92.5 kg (203 lb 14.8 oz) IBW/kg (Calculated) : 73  Temp (24hrs), Avg:98.9 F (37.2 C), Min:98.3 F (36.8 C), Max:99.8 F (37.7 C)  Recent Labs  Lab 06/26/22 1956 06/26/22 2319 06/27/22 0511 06/28/22 0559 06/29/22 0250 06/29/22 1240 06/30/22 0203 06/30/22 1006  WBC 23.7*  --  22.2* 17.1* 18.0*  --  15.0*  --   CREATININE 0.94  --  0.82 0.87  --  0.83 0.82  --   LATICACIDVEN 2.8* 1.4  --   --   --   --   --   --   VANCOTROUGH  --   --   --   --   --   --   --  13*  VANCOPEAK  --   --   --   --   --   --  24*  --      Estimated Creatinine Clearance: 134.1 mL/min (by C-G formula based on SCr of 0.82 mg/dL).    No Known Allergies  Antimicrobials this admission:   Vancomycin 5/13 >>    Cefepime 5/13 >>   Dose adjustments this admission:  Microbiology results: 5/12 blood cx: 2/2 sets MRSA 5/15 repeat Bcx NGTD 5/13 Bone:MRSA and E coli (pan-susc)  Thank you for  allowing pharmacy to be a part of this patient's care.  Juliette Alcide, PharmD, BCPS, BCIDP Work Cell: [phone] 06/30/2022 10:49 AM

## 2022-06-30 NOTE — Op Note (Signed)
Surgeon: Surgeon(s): Candelaria Stagers, DPM  Assistants: None Pre-operative diagnosis: osteomyelitis  Post-operative diagnosis: same Procedure: Procedure(s) (LRB): IRRIGATION AND DEBRIDEMENT WOUND WITH CLOSURE (Right)  Pathology: * No specimens in log *  Pertinent Intra-op findings: All tissue planes were thoroughly explored.  No purulent drainage noted.  Bleeding was still minimal Anesthesia: Choice  Hemostasis: * No tourniquets in log * EBL: 10 cc Materials: Kerecis graft Injectables: 10 cc of half percent Marcaine plain Complications: None  Indications for surgery: A 42 y.o. male presents with right foot osteomyelitis with abscess now status post incision and drainage with amputation of the second toe and ray. Patient has failed all conservative therapy including but not limited to local wound care. He wishes to have surgical correction of the foot/deformity. It was determined that patient would benefit from right revisional excisional debridement with delayed primary closure as part of a staged procedure. Informed surgical risk consent was reviewed and read aloud to the patient.  I reviewed the films.  I have discussed my findings with the patient in great detail.  I have discussed all risks including but not limited to infection, stiffness, scarring, limp, disability, deformity, damage to blood vessels and nerves, numbness, poor healing, need for braces, arthritis, chronic pain, amputation, death.  All benefits and realistic expectations discussed in great detail.  I have made no promises as to the outcome.  I have provided realistic expectations.  I have offered the patient a 2nd opinion, which they have declined and assured me they preferred to proceed despite the risks   Procedure in detail: The patient was both verbally and visually identified by myself, the nursing staff, and anesthesia staff in the preoperative holding area. They were then transferred to the operating room and placed on  the operative table in supine position.  Attention was directed to the partial second ray amputation site.  Using rongeur all necrotic tissue and nonviable tissue was removed in standard technique.  No purulent drainage noted.  Minimal bleeding noted.  The wound was thoroughly irrigated with normal saline solution at this time given that there is no further purulent drainage noted the wound to be primarily closed.  Kerecis was inserted for increased granulation tissue and healing potential.  Wound was primarily closed with 3-0 Prolene.  The incision was dressed with Xeroform 4 x 4 gauze Kerlix Ace bandage all bony prominences likely padded  At the conclusion of the procedure the patient was awoken from anesthesia and found to have tolerated the procedure well any complications. There were transferred to PACU with vital signs stable and vascular status intact.  Nicholes Rough, DPM

## 2022-06-30 NOTE — Transfer of Care (Signed)
Immediate Anesthesia Transfer of Care Note  Patient: Martin Mullen  Procedure(s) Performed: IRRIGATION AND DEBRIDEMENT WOUND (Right) AMPUTATION RAY (Right) APPLICATION OF WOUND VAC (Right)  Patient Location: PACU  Anesthesia Type:General  Level of Consciousness: sedated  Airway & Oxygen Therapy: Patient Spontanous Breathing and Patient connected to face mask  Post-op Assessment: Report given to RN and Post -op Vital signs reviewed and stable  Post vital signs: Reviewed and stable  Last Vitals:  Vitals Value Taken Time  BP 113/74 06/30/22 1319  Temp    Pulse 88 06/30/22 1323  Resp 14 06/30/22 1323  SpO2 100 % 06/30/22 1323  Vitals shown include unvalidated device data.  Last Pain:  Vitals:   06/30/22 1129  TempSrc: Temporal  PainSc:       Patients Stated Pain Goal: 0 (06/30/22 1116)  Complications: No notable events documented.

## 2022-06-30 NOTE — Progress Notes (Signed)
Date of Admission:  06/26/2022     ID: Martin Mullen is a 42 y.o. 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)   Recurrent strokes (HCC)   Cellulitis of right foot   History of Fournier's gangrene   Chronic anticoagulation   Diabetic foot ulcer associated with type 2 diabetes mellitus (HCC)   Plantar ulcer of right foot, with fat layer exposed (HCC)   PAD (peripheral artery disease) (HCC)   Hypokalemia   MRSA bacteremia   Diabetic infection of right foot (HCC)   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)    Subjective: Pt had further debridement today  Medications:   [MAR Hold] aspirin EC  81 mg Oral Daily   [MAR Hold] buPROPion  150 mg Oral Daily   [MAR Hold] insulin aspart  0-15 Units Subcutaneous TID WC   [MAR Hold] insulin aspart  0-5 Units Subcutaneous QHS   [MAR Hold] insulin glargine-yfgn  16 Units Subcutaneous Daily   [MAR Hold] losartan  25 mg Oral Daily   [MAR Hold] nicotine  21 mg Transdermal Daily   [MAR Hold] polyethylene glycol  17 g Oral Daily   [MAR Hold] rosuvastatin  20 mg Oral Daily    Objective: Vital signs in last 24 hours: Patient Vitals for the past 24 hrs:  BP Temp Temp src Pulse Resp SpO2 Height Weight  06/30/22 1400 102/68 98.2 F (36.8 C) -- 85 12 94 % -- --  06/30/22 1336 -- -- -- 84 12 94 % -- --  06/30/22 1330 108/69 -- -- 87 12 95 % -- --  06/30/22 1319 113/74 97.8 F (36.6 C) -- 85 14 100 % -- --  06/30/22 1129 128/86 98.4 F (36.9 C) Temporal 90 16 96 % -- --  06/30/22 1116 -- -- -- -- -- -- 5\' 10"  (1.778 m) 92.5 kg  06/30/22 0755 132/88 98.3 F (36.8 C) -- 92 18 95 % -- --  06/30/22 0528 115/86 99.1 F (37.3 C) Oral 97 15 97 %  -- --  06/30/22 0400 138/86 99.2 F (37.3 C) Oral 100 14 97 % -- --  06/29/22 2002 122/84 99.8 F (37.7 C) Oral 97 17 97 % -- --  06/29/22 1847 130/89 98.6 F (37 C) -- 88 16 98 % -- --  06/29/22 1719 133/86 98.6 F (37 C) -- 84 16 98 % -- --      PHYSICAL EXAM:  General: Alert, cooperative, no distress,  Lungs: Clear to auscultation bilaterally. No Wheezing or Rhonchi. No rales. Heart: Regular rate and rhythm, no murmur, rub or gallop. Abdomen: Soft, non-tender,not distended. Bowel sounds normal. No masses Extremities: Right leg surgical dressing not removed Skin: No rashes or lesions. Or bruising Lymph: Cervical, supraclavicular normal. 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  Lab Results    Latest Ref Rng & Units 06/30/2022    2:03 AM 06/29/2022    2:50 AM 06/28/2022    5:59 AM  CBC  WBC 4.0 - 10.5 K/uL 15.0  18.0  17.1   Hemoglobin 13.0 - 17.0 g/dL 16.1  09.6  04.5   Hematocrit 39.0 - 52.0 % 39.2  39.5  44.3  Platelets 150 - 400 K/uL 283  286  277        Latest Ref Rng & Units 06/30/2022    2:03 AM 06/29/2022   12:40 PM 06/28/2022    5:59 AM  CMP  Glucose 70 - 99 mg/dL 295  621  308   BUN 6 - 20 mg/dL 9  9  8    Creatinine 0.61 - 1.24 mg/dL 6.57  8.46  9.62   Sodium 135 - 145 mmol/L 131  133  135   Potassium 3.5 - 5.1 mmol/L 4.1  4.0  3.6   Chloride 98 - 111 mmol/L 98  100  101   CO2 22 - 32 mmol/L 23  23  24    Calcium 8.9 - 10.3 mg/dL 8.4  8.2  8.4       Microbiology: Blood culture 1 out of 4 MRSA Wound cultures so far Staph aureus Studies/Results: ECHOCARDIOGRAM COMPLETE  Result Date: 06/30/2022    ECHOCARDIOGRAM REPORT   Patient Name:   Martin Mullen Date of Exam: 06/28/2022 Medical Rec #:  [id number]       Height:       70.0 in Accession #:    [id number]      Weight:       203.9 lb Date of Birth:  1980-06-13       BSA:          2.105 m Patient Age:    42 years        BP:           147/97 mmHg Patient Gender: M                HR:           103 bpm. Exam Location:  [facility] Procedure: 2D Echo, Cardiac Doppler and Color Doppler Indications:     R78.81 Bacteremia  History:         Patient has prior history of Echocardiogram examinations, most                  recent 09/12/2021. Previous Myocardial Infarction and CAD,                  Stroke; Risk Factors:Diabetes, Dyslipidemia and Current Smoker.  Sonographer:     Daphine Deutscher RDCS Referring Phys:  [id number] Lynn Ito Diagnosing Phys: Julien Nordmann MD IMPRESSIONS  1. Left ventricular ejection fraction, by estimation, is 60 to 65%. The left ventricle has normal function. The left ventricle has no regional wall motion abnormalities. Left ventricular diastolic parameters were normal.  2. Right ventricular systolic function is normal. The right ventricular size is normal.  3. The mitral valve is normal in structure. Mild mitral valve regurgitation. No evidence of mitral stenosis.  4. The aortic valve is normal in structure. Aortic valve regurgitation is not visualized. No aortic stenosis is present.  5. The inferior vena cava is normal in size with greater than 50% respiratory variability, suggesting right atrial pressure of 3 mmHg.  6. No valve vegetation noted. FINDINGS  Left Ventricle: Left ventricular ejection fraction, by estimation, is 60 to 65%. The left ventricle has normal function. The left ventricle has no regional wall motion abnormalities. The left ventricular internal cavity size was normal in size. There is  no left ventricular hypertrophy. Left ventricular diastolic parameters were normal. Right Ventricle: The right ventricular size is normal. No increase in right ventricular wall thickness. Right ventricular systolic function is normal. Left Atrium: Left atrial size  was normal in size. Right Atrium: Right atrial size was normal in size. Pericardium: There is no evidence of pericardial effusion. Mitral Valve: The mitral valve is normal in structure. Mild mitral  valve regurgitation. No evidence of mitral valve stenosis. Tricuspid Valve: The tricuspid valve is normal in structure. Tricuspid valve regurgitation is not demonstrated. No evidence of tricuspid stenosis. Aortic Valve: The aortic valve is normal in structure. Aortic valve regurgitation is not visualized. No aortic stenosis is present. Pulmonic Valve: The pulmonic valve was normal in structure. Pulmonic valve regurgitation is not visualized. No evidence of pulmonic stenosis. Aorta: The aortic root is normal in size and structure. Venous: The inferior vena cava is normal in size with greater than 50% respiratory variability, suggesting right atrial pressure of 3 mmHg. IAS/Shunts: The interatrial septum appears to be lipomatous. No atrial level shunt detected by color flow Doppler.  LEFT VENTRICLE PLAX 2D LVIDd:         3.50 cm   Diastology LVIDs:         2.40 cm   LV e' medial:    9.57 cm/s LV PW:         1.00 cm   LV E/e' medial:  11.5 LV IVS:        1.00 cm   LV e' lateral:   16.27 cm/s LVOT diam:     2.00 cm   LV E/e' lateral: 6.8 LV SV:         58 LV SV Index:   27 LVOT Area:     3.14 cm  RIGHT VENTRICLE             IVC RV Basal diam:  3.70 cm     IVC diam: 1.60 cm RV S prime:     16.67 cm/s TAPSE (M-mode): 2.8 cm LEFT ATRIUM             Index        RIGHT ATRIUM           Index LA diam:        4.70 cm 2.23 cm/m   RA Area:     16.50 cm LA Vol (A2C):   43.0 ml 20.43 ml/m  RA Volume:   47.80 ml  22.71 ml/m LA Vol (A4C):   33.4 ml 15.87 ml/m LA Biplane Vol: 39.1 ml 18.58 ml/m  AORTIC VALVE LVOT Vmax:   109.33 cm/s LVOT Vmean:  74.333 cm/s LVOT VTI:    0.184 m  AORTA Ao Root diam: 4.00 cm MITRAL VALVE MV Area (PHT): 5.02 cm     SHUNTS MV Decel Time: 151 msec     Systemic VTI:  0.18 m MV E velocity: 110.00 cm/s  Systemic Diam: 2.00 cm MV A velocity: 88.40 cm/s MV E/A ratio:  1.24 Julien Nordmann MD Electronically signed by Julien Nordmann MD Signature Date/Time: 06/30/2022/8:05:27 AM    Final    PERIPHERAL  VASCULAR CATHETERIZATION  Result Date: 06/29/2022 See surgical note for result.    Assessment/Plan:  ?MRSA bacteremia- source is the foot He is currently on vancomycin  repeat blood cultures 06/29/22 ng so far 2 d echo done /TEE is neededto rule out endocarditis. Seen by cardiology   Rt foot infection- 2nd toe abscess/osteo Wound plantar surface S/p I/D , 2nd toe ray excision Culture MRSA and ecoli Pathology acute osteo extending to proximal margin of 2 nd toe 3d toe biopsy no osteo  Will need long term antibiotic-  If no endocarditis then may consider after  1-2 week IV to switch to Po Pt currently on vanco and cefepime Change cefepime to unasyn, continue vanco    Diabetes mellitus with peripheral neuropathy with DFI Callus left foot On metformin and insulin  HTN   CAD s/p stent  Recurrent CVA due to prothrombin gene mutation on eliquis   Current smoker   ? Discussed the management with the patient and care team

## 2022-06-30 NOTE — Consult Note (Signed)
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   No Known Allergies  Patient Measurements: Height: 5\' 10"  (177.8 cm) Weight: 92.5 kg (203 lb 14.8 oz) IBW/kg (Calculated) : 73 Heparin Dosing Weight: 91.6 kg  Vital Signs: Temp: 99.8 F (37.7 C) (05/15 2002) Temp Source: Oral (05/15 2002) BP: 122/84 (05/15 2002) Pulse Rate: 97 (05/15 2002)  Labs: Recent Labs    06/27/22 0511 06/28/22 0559 06/28/22 1116 06/28/22 1913 06/29/22 0250 06/29/22 1240 06/29/22 1743 06/30/22 0203  HGB 14.4 14.9  --   --  13.9  --   --  13.4  HCT 42.2 44.3  --   --  39.5  --   --  39.2  PLT 263 277  --   --  286  --   --  283  APTT  --   --  32   < > 40*  --  40* 52*  LABPROT 16.1*  --  15.8*  --   --   --   --   --   INR 1.3*  --  1.2  --   --   --   --   --   HEPARINUNFRC  --   --   --   --  0.26*  --   --  0.26*  CREATININE 0.82 0.87  --   --   --  0.83  --  0.82   < > = values in this interval not displayed.     Estimated Creatinine Clearance: 134.1 mL/min (by C-G formula based on SCr of 0.82 mg/dL).   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   Chicken pox    Coronary artery disease    Inferior ST elevation myocardial infarction in July of 2014. Cardiac catheterization showed 95% distal RCA stenosis and 90% first diagonal stenosis with normal ejection fraction. He underwent PCI in bare-metal stent placement to the distal RCA.   Diabetes mellitus without complication (HCC)    Hypercholesterolemia    Stroke (HCC)    Tobacco use     Pertinent Medications:  PTA: Apixaban 5mg  BID, last dose: 5/13@2115   Assessment: Martin Mullen is a 42 y.o. 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  He presented to the hospital because of pain in the right leg. Plan for right lower extremity angiogram tomorrow, 5/15. Will hold apixaban and start patient no heparin infusion.  Baseline labs: aPTT 32 sec, INR 1.2, Plts 277, Hgb 14.9  5/14 1913 aPTT 32, Subtherapeutic  5/15 0250 aPTT 40, subtherapeutic, HL 0.26 5/15 1743 aPTT 40, subtherapeutic  5/16 0203 aPTT 52, subtherapeutic, HL0.26  Goal of Therapy:  Heparin level 0.3-0.7 units/ml aPTT 66-102 seconds Monitor platelets by anticoagulation protocol: Yes   Plan:  Heparin subtherapeutic  Heparin bolus 2700 units x 1 Increase heparin infusion to 2400 units/hr Check aPTT 6 hours after rate change. Will use aPTT to titrate dose until levels correlate, then will transition to anti-Xa dosing. Check anti-Xa daily. Continue to monitor H&H and platelets   Martin Mullen, PharmD, BCPS Clinical Pharmacist   06/30/2022 3:12 AM

## 2022-07-01 ENCOUNTER — Encounter: Payer: Self-pay | Admitting: Podiatry

## 2022-07-01 ENCOUNTER — Encounter
Admission: EM | Disposition: A | Discharge status: Home Health | Payer: Self-pay | Source: Home / Self Care | Attending: Internal Medicine

## 2022-07-01 ENCOUNTER — Inpatient Hospital Stay: Payer: Medicaid Other | Admitting: Anesthesiology

## 2022-07-01 ENCOUNTER — Inpatient Hospital Stay (HOSPITAL_COMMUNITY)
Admit: 2022-07-01 | Discharge: 2022-07-01 | Disposition: A | Discharge status: Home / Self Care | Payer: Medicaid Other | Attending: Podiatry | Admitting: Podiatry

## 2022-07-01 DIAGNOSIS — I340 Nonrheumatic mitral (valve) insufficiency: Secondary | ICD-10-CM

## 2022-07-01 DIAGNOSIS — M8608 Acute hematogenous osteomyelitis, other sites: Secondary | ICD-10-CM | POA: Diagnosis not present

## 2022-07-01 DIAGNOSIS — B9562 Methicillin resistant Staphylococcus aureus infection as the cause of diseases classified elsewhere: Secondary | ICD-10-CM | POA: Diagnosis not present

## 2022-07-01 DIAGNOSIS — R7881 Bacteremia: Secondary | ICD-10-CM

## 2022-07-01 DIAGNOSIS — I739 Peripheral vascular disease, unspecified: Secondary | ICD-10-CM | POA: Diagnosis not present

## 2022-07-01 DIAGNOSIS — E11628 Type 2 diabetes mellitus with other skin complications: Secondary | ICD-10-CM | POA: Diagnosis not present

## 2022-07-01 DIAGNOSIS — L089 Local infection of the skin and subcutaneous tissue, unspecified: Secondary | ICD-10-CM | POA: Diagnosis not present

## 2022-07-01 HISTORY — PX: TEE WITHOUT CARDIOVERSION: SHX5443

## 2022-07-01 LAB — CBC
HCT: 37.8 % — ABNORMAL LOW (ref 39.0–52.0)
Hemoglobin: 13.3 g/dL (ref 13.0–17.0)
MCH: 31.2 pg (ref 26.0–34.0)
MCHC: 35.2 g/dL (ref 30.0–36.0)
MCV: 88.7 fL (ref 80.0–100.0)
Platelets: 337 10*3/uL (ref 150–400)
RBC: 4.26 MIL/uL (ref 4.22–5.81)
RDW: 12.3 % (ref 11.5–15.5)
WBC: 20.3 10*3/uL — ABNORMAL HIGH (ref 4.0–10.5)
nRBC: 0 % (ref 0.0–0.2)

## 2022-07-01 LAB — BASIC METABOLIC PANEL
Anion gap: 9 (ref 5–15)
BUN: 14 mg/dL (ref 6–20)
CO2: 23 mmol/L (ref 22–32)
Calcium: 8.6 mg/dL — ABNORMAL LOW (ref 8.9–10.3)
Chloride: 102 mmol/L (ref 98–111)
Creatinine, Ser: 0.78 mg/dL (ref 0.61–1.24)
GFR, Estimated: >60 mL/min (ref >60)
Glucose, Bld: 277 mg/dL — ABNORMAL HIGH (ref 70–99)
Potassium: 4.4 mmol/L (ref 3.5–5.1)
Sodium: 134 mmol/L — ABNORMAL LOW (ref 135–145)

## 2022-07-01 LAB — ECHO TEE

## 2022-07-01 LAB — HEPARIN LEVEL (UNFRACTIONATED)
Heparin Unfractionated: 0.6 IU/mL (ref 0.30–0.70)
Heparin Unfractionated: 0.71 IU/mL — ABNORMAL HIGH (ref 0.30–0.70)

## 2022-07-01 LAB — APTT
aPTT: 93 seconds — ABNORMAL HIGH (ref 24–36)
aPTT: 95 seconds — ABNORMAL HIGH (ref 24–36)

## 2022-07-01 LAB — GLUCOSE, CAPILLARY
Glucose-Capillary: 254 mg/dL — ABNORMAL HIGH (ref 70–99)
Glucose-Capillary: 189 mg/dL — ABNORMAL HIGH (ref 70–99)
Glucose-Capillary: 226 mg/dL — ABNORMAL HIGH (ref 70–99)
Glucose-Capillary: 311 mg/dL — ABNORMAL HIGH (ref 70–99)

## 2022-07-01 SURGERY — ECHOCARDIOGRAM, TRANSESOPHAGEAL
Anesthesia: General

## 2022-07-01 MED ORDER — BUTAMBEN-TETRACAINE-BENZOCAINE 2-2-14 % EX AERO
INHALATION_SPRAY | CUTANEOUS | Status: AC
  Administered 2022-07-01: 1 via ORAL
  Filled 2022-07-01: qty 5

## 2022-07-01 MED ORDER — PROPOFOL 10 MG/ML IV BOLUS
INTRAVENOUS | Status: DC | PRN
  Administered 2022-07-01 (×2): 20 mg via INTRAVENOUS
  Administered 2022-07-01: 60 mg via INTRAVENOUS
  Administered 2022-07-01: 20 mg via INTRAVENOUS
  Administered 2022-07-01: 10 mg via INTRAVENOUS

## 2022-07-01 MED ORDER — SODIUM CHLORIDE 0.9 % IV SOLN: INTRAVENOUS | Status: DC | PRN

## 2022-07-01 MED ORDER — LACTULOSE 10 GM/15ML PO SOLN
20.0000 g | Freq: Two times a day (BID) | ORAL | Status: DC
  Administered 2022-07-01 – 2022-07-02 (×3): 20 g via ORAL
  Filled 2022-07-01 (×6): qty 30

## 2022-07-01 MED ORDER — LIDOCAINE VISCOUS HCL 2 % MT SOLN
15.0000 mL | Freq: Once | OROMUCOSAL | Status: DC
  Filled 2022-07-01: qty 15

## 2022-07-01 MED ORDER — HEPARIN (PORCINE) 25000 UT/250ML-% IV SOLN
INTRAVENOUS | Status: AC
  Filled 2022-07-01: qty 250

## 2022-07-01 MED ORDER — BUTAMBEN-TETRACAINE-BENZOCAINE 2-2-14 % EX AERO
1.0000 | INHALATION_SPRAY | Freq: Once | CUTANEOUS | Status: DC
  Filled 2022-07-01: qty 20

## 2022-07-01 MED ORDER — LIDOCAINE VISCOUS HCL 2 % MT SOLN
OROMUCOSAL | Status: AC
  Administered 2022-07-01: 10 mL via ORAL
  Filled 2022-07-01: qty 15

## 2022-07-01 NOTE — Anesthesia Procedure Notes (Signed)
Procedure Name: MAC Date/Time: 07/01/2022 12:59 PM  Performed by: Hezzie Bump, CRNAPre-anesthesia Checklist: Patient identified, Emergency Drugs available, Suction available and Patient being monitored Patient Re-evaluated:Patient Re-evaluated prior to induction Oxygen Delivery Method: Nasal cannula Induction Type: IV induction Placement Confirmation: positive ETCO2

## 2022-07-01 NOTE — Progress Notes (Signed)
   PODIATRY PROGRESS NOTE Patient Name: Martin Mullen  DOB 05/29/1980 DOA 06/26/2022  Hospital Day: 6  Assessment:  42 y.o. 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  AF, VSS  WBC: 20.3  Wound/Bone Cultures: MRSA, E coli  Plan:  - Dressing changed today, doing well no issues in regards to healing. - Abx per ID, appreciate recs - Dressing to remain intact until Monday then changed again - Limited weightbearing to right foot in CAM boot, ordered - Pt will follow up 1 week from discharge        Corinna Gab, DPM [facility]    Subjective:  Patient seen with Dr. Rivka Safer, states he is having no pain, doing well. Understands plan follow up and post op care.   Objective:   Vitals:   07/01/22 1445 07/01/22 1522  BP: 106/65 129/77  Pulse: 80 90  Resp: 12 14  Temp:  97.9 F (36.6 C)  SpO2: 91% 90%       Latest Ref Rng & Units 07/01/2022    5:12 AM 06/30/2022    2:03 AM 06/29/2022    2:50 AM  CBC  WBC 4.0 - 10.5 K/uL 20.3  15.0  18.0   Hemoglobin 13.0 - 17.0 g/dL 16.1  09.6  04.5   Hematocrit 39.0 - 52.0 % 37.8  39.2  39.5   Platelets 150 - 400 K/uL 337  283  286        Latest Ref Rng & Units 07/01/2022    5:12 AM 06/30/2022    2:03 AM 06/29/2022   12:40 PM  BMP  Glucose 70 - 99 mg/dL 409  811  914   BUN 6 - 20 mg/dL 14  9  9    Creatinine 0.61 - 1.24 mg/dL 7.82  9.56  2.13   Sodium 135 - 145 mmol/L 134  131  133   Potassium 3.5 - 5.1 mmol/L 4.4  4.1  4.0   Chloride 98 - 111 mmol/L 102  98  100   CO2 22 - 32 mmol/L 23  23  23    Calcium 8.9 - 10.3 mg/dL 8.6  8.4  8.2     General: AAOx3, NAD  Lower Extremity Exam 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        Radiology:  Results reviewed. See assessment for pertinent  imaging results

## 2022-07-01 NOTE — Consult Note (Signed)
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   No Known Allergies  Patient Measurements: Height: 5\' 10"  (177.8 cm) Weight: 92.5 kg (203 lb 14.8 oz) IBW/kg (Calculated) : 73 Heparin Dosing Weight: 91.6 kg  Vital Signs: Temp: 97.9 F (36.6 C) (05/17 1522) Temp Source: Oral (05/17 1215) BP: 129/77 (05/17 1522) Pulse Rate: 90 (05/17 1522)  Labs: Recent Labs    06/29/22 0250 06/29/22 1240 06/29/22 1743 06/30/22 0203 06/30/22 1006 06/30/22 1706 07/01/22 0512 07/01/22 1318  HGB 13.9  --   --  13.4  --   --  13.3  --   HCT 39.5  --   --  39.2  --   --  37.8*  --   PLT 286  --   --  283  --   --  337  --   APTT 40*  --    < > 52*   < > 53* 93* 95*  HEPARINUNFRC 0.26*  --   --  0.26*  --   --  0.60 0.71*  CREATININE  --  0.83  --  0.82  --   --  0.78  --    < > = values in this interval not displayed.     Estimated Creatinine Clearance: 137.5 mL/min (by C-G formula based on SCr of 0.78 mg/dL).   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   Chicken pox    Coronary artery disease    Inferior ST elevation myocardial infarction in July of 2014. Cardiac catheterization showed 95% distal RCA stenosis and 90% first diagonal stenosis with normal ejection fraction. He underwent PCI in bare-metal stent placement to the distal RCA.   Diabetes mellitus without complication (HCC)    Hypercholesterolemia    Stroke (HCC)    Tobacco use     Pertinent Medications:  PTA: Apixaban 5mg  BID, last dose: 5/13@2115   Assessment: SO is a 42 y.o. 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  He  presented to the hospital because of pain in the right leg. Plan for right lower extremity angiogram tomorrow, 5/15. Will hold apixaban and start patient no heparin infusion.  Baseline labs: aPTT 32 sec, INR 1.2, Plts 277, Hgb 14.9  Goal of Therapy:  Heparin level 0.3-0.7 units/ml aPTT 66-102 seconds Monitor platelets by anticoagulation protocol: Yes   Plan:  aPTT therapeutic x 2(HL slightly elevated, 0.71) Continue heparin infusion rate at 2600 units/hr Recheck aPTT/HL with AM labs Will use aPTT to titrate dose until levels correlation confirmed, then will transition to anti-Xa dosing. Continue to monitor H&H and platelets  Bettey Costa, PharmD Clinical Pharmacist 07/01/2022 3:27 PM

## 2022-07-01 NOTE — Consult Note (Signed)
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   No Known Allergies  Patient Measurements: Height: 5\' 10"  (177.8 cm) Weight: 92.5 kg (203 lb 14.8 oz) IBW/kg (Calculated) : 73 Heparin Dosing Weight: 91.6 kg  Vital Signs: Temp: 97.7 F (36.5 C) (05/17 0418) Temp Source: Oral (05/17 0418) BP: 134/91 (05/17 0418) Pulse Rate: 95 (05/17 0418)  Labs: Recent Labs    06/28/22 1116 06/28/22 1913 06/29/22 0250 06/29/22 1240 06/29/22 1743 06/30/22 0203 06/30/22 1006 06/30/22 1706 07/01/22 0512  HGB  --    < > 13.9  --   --  13.4  --   --  13.3  HCT  --   --  39.5  --   --  39.2  --   --  37.8*  PLT  --   --  286  --   --  283  --   --  337  APTT 32   < > 40*  --    < > 52* 66* 53* 93*  LABPROT 15.8*  --   --   --   --   --   --   --   --   INR 1.2  --   --   --   --   --   --   --   --   HEPARINUNFRC  --   --  0.26*  --   --  0.26*  --   --  0.60  CREATININE  --   --   --  0.83  --  0.82  --   --   --    < > = values in this interval not displayed.     Estimated Creatinine Clearance: 134.1 mL/min (by C-G formula based on SCr of 0.82 mg/dL).   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   Chicken pox    Coronary artery disease    Inferior ST elevation myocardial infarction in July of 2014. Cardiac catheterization showed 95% distal RCA stenosis and 90% first diagonal stenosis with normal ejection fraction. He underwent PCI in bare-metal stent placement to the distal RCA.   Diabetes mellitus without complication (HCC)    Hypercholesterolemia    Stroke (HCC)    Tobacco use     Pertinent Medications:  PTA: Apixaban 5mg  BID, last dose: 5/13@2115   Assessment: Martin Mullen is a 42 y.o. male with medical history significant for STEMI s/p RCA stent 2014, hypertension, recurrent strokes, heterozygous prothrombin  gene mutation on Eliquis, insulin requiring type 2 diabetes insulin-dependent type 2 diabetes poorly controlled, history of Fournier's gangrene.  He presented to the hospital because of pain in the right leg. Plan for right lower extremity angiogram tomorrow, 5/15. Will hold apixaban and start patient no heparin infusion.  Baseline labs: aPTT 32 sec, INR 1.2, Plts 277, Hgb 14.9  Goal of Therapy:  Heparin level 0.3-0.7 units/ml aPTT 66-102 seconds Monitor platelets by anticoagulation protocol: Yes   Plan:  aPTT & HL therapeutic x 1  Continue heparin infusion rate at 2600 units/hr Recheck aPTT/HL in 6 hours to confirm Will use aPTT to titrate dose until levels correlation confirmed, then will transition to anti-Xa dosing. Continue to monitor H&H and platelets  Otelia Sergeant, PharmD, [facility] 07/01/2022 6:06 AM

## 2022-07-01 NOTE — Progress Notes (Addendum)
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)   Recurrent strokes (HCC)   History of Fournier's gangrene   Chronic anticoagulation   Plantar ulcer of right foot, with fat layer exposed (HCC)   PAD (peripheral artery disease) (HCC)   Hypokalemia   MRSA bacteremia   Diabetic infection of right foot (HCC)   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 S/p washout I&D of right foot infection on 06/27/2022. S/p debridement, graft, & VAC application 06/30/22 as per podiatry. Continue on IV vanco, unasyn as per ID. Podiatry & ID following and recs apprec. See Dr. Helayne Seminole notes on how pt met sepsis criteria. Sepsis resolved   MRSA bacteremia: continue on IV vanco, unasyn as per ID. Will go for TEE today as per cardio    Hypokalemia: WNL today   Hyponatremia: almost WNL today    Hx of CAD: w/ bare-metal stent to distal RCA in 2014. Continue on aspirin, statin   PVD: s/p RLE angiogram in which angioplasty of right popliteal artery, right tibioperoneal trunk, posterior tibial artery was done & stent placed into the right popliteal artery on 06/29/22. Continue on aspirin, statin     Heterozygous for prothrombin gene 20210A mutation: w/ recurrent strokes. Holding eliquis. Continue on IV heparin drip    DMII: poorly controlled, HbA1c 8.2. Continue on glargine, SSI w/ accuchecks   Overweight: BMI 29.2. Would benefit from weight loss     DVT prophylaxis: IV heparin  Code Status: full  Family Communication:  Disposition Plan: likely d/c back home  Level of care: Telemetry Medical  Status is: Inpatient Remains inpatient appropriate because: severity of illness, TEE today     Consultants:  Vasc surg ID  Cardio   Procedures:   Antimicrobials: cefepime, vanco   Subjective: Pt c/o fatigue   Objective: Vitals:   06/30/22 1541 06/30/22 2100 07/01/22 0418 07/01/22 0740  BP: 115/79 120/85 (!) 134/91 (!) 140/91  Pulse: 95 95 95 87  Resp: 18 18 18 17   Temp: 97.9 F (36.6 C) 98.7 F (37.1 C) 97.7 F (36.5 C) 98 F (36.7 C)  TempSrc: Oral Oral Oral Oral  SpO2: 94% 94% 94% 94%  Weight:      Height:        Intake/Output Summary (Last 24 hours) at 07/01/2022 0756 Last data filed at 07/01/2022 0458 Gross per 24 hour  Intake 1501.26 ml  Output 900 ml  Net 601.26 ml   Filed Weights   06/26/22 1838 06/30/22 1116  Weight: 92.5 kg 92.5 kg    Examination:  General exam: Appears calm & comfortable  Respiratory system: clear breath sounds b/l Cardiovascular system: S1 & S2+. No rubs or gallops  Gastrointestinal system: abd is soft, NT, ND & normal bowel sounds  Central nervous system: Awake. Moves all extremities  Psychiatry: judgement and insight appears at baseline. Flat mood and affect     Data Reviewed: I have personally reviewed following labs and imaging studies  CBC: Recent Labs  Lab 06/26/22 1956 06/27/22 0511 06/28/22 0559 06/29/22 0250 06/30/22 0203 07/01/22 0512  WBC 23.7* 22.2* 17.1* 18.0* 15.0* 20.3*  NEUTROABS 20.2*  --  12.9*  --   --   --   HGB 15.6 14.4 14.9 13.9 13.4 13.3  HCT 45.5 42.2 44.3 39.5 39.2 37.8*  MCV 89.9  90.9 90.8 87.8 88.3 88.7  PLT 321 263 277 286 283 337   Basic Metabolic Panel: Recent Labs  Lab 06/27/22 0511 06/28/22 0559 06/29/22 1240 06/30/22 0203 07/01/22 0512  NA 131* 135 133* 131* 134*  K 3.4* 3.6 4.0 4.1 4.4  CL 99 101 100 98 102  CO2 21* 24 23 23 23   GLUCOSE 222* 153* 127* 198* 277*  BUN 9 8 9 9 14   CREATININE 0.82 0.87 0.83 0.82 0.78  CALCIUM 8.0* 8.4* 8.2* 8.4* 8.6*  MG  --  1.8  --   --   --    GFR: Estimated Creatinine Clearance: 137.5 mL/min (by C-G formula based on SCr of 0.78 mg/dL). Liver Function Tests: Recent Labs  Lab 06/26/22 1956  AST 13*  ALT 8  ALKPHOS 125  BILITOT 1.0  PROT 7.8  ALBUMIN 3.4*   No results for input(s): "LIPASE", "AMYLASE" in the last 168 hours. No results for input(s): "AMMONIA" in the last 168 hours. Coagulation Profile: Recent Labs  Lab 06/27/22 0511 06/28/22 1116  INR 1.3* 1.2   Cardiac Enzymes: No results for input(s): "CKTOTAL", "CKMB", "CKMBINDEX", "TROPONINI" in the last 168 hours. BNP (last 3 results) No results for input(s): "PROBNP" in the last 8760 hours. HbA1C: No results for input(s): "HGBA1C" in the last 72 hours. CBG: Recent Labs  Lab 06/30/22 0757 06/30/22 1323 06/30/22 1653 06/30/22 2116 07/01/22 0744  GLUCAP 169* 117* 314* 325* 254*   Lipid Profile: No results for input(s): "CHOL", "HDL", "LDLCALC", "TRIG", "CHOLHDL", "LDLDIRECT" in the last 72 hours. Thyroid Function Tests: No results for input(s): "TSH", "T4TOTAL", "FREET4", "T3FREE", "THYROIDAB" in the last 72 hours. Anemia Panel: No results for input(s): "VITAMINB12", "FOLATE", "FERRITIN", "TIBC", "IRON", "RETICCTPCT" in the last 72 hours. Sepsis Labs: Recent Labs  Lab 06/26/22 1956 06/26/22 2319 06/27/22 0511  PROCALCITON  --   --  0.46  LATICACIDVEN 2.8* 1.4  --     Recent Results (from the past 240 hour(s))  Blood culture (single)     Status: Abnormal   Collection Time: 06/26/22  7:56 PM   Specimen: BLOOD  Result Value  Ref Range Status   Specimen Description   Final    BLOOD RIGHT ANTECUBITAL Performed at [facility], [address], [place], [place] [address]    Special Requests   Final    BOTTLES DRAWN AEROBIC AND ANAEROBIC Blood Culture adequate volume Performed at [facility], [address] [place], [place] [address]    Culture  Setup Time   Final    GRAM POSITIVE COCCI AEROBIC BOTTLE ONLY CRITICAL RESULT CALLED TO, READ BACK BY AND VERIFIED WITH: [facility] MITCHELL 06/27/2022 1124 CP Performed at [facility], [address] [place], [place] [address]    Culture METHICILLIN RESISTANT STAPHYLOCOCCUS AUREUS (A)  Final   Report Status 06/29/2022 FINAL  Final  Organism ID, Bacteria METHICILLIN RESISTANT STAPHYLOCOCCUS AUREUS  Final      Susceptibility   Methicillin resistant staphylococcus aureus - MIC*    CIPROFLOXACIN <=0.5 SENSITIVE Sensitive     ERYTHROMYCIN 0.5 SENSITIVE Sensitive     GENTAMICIN <=0.5 SENSITIVE Sensitive     OXACILLIN >=4 RESISTANT Resistant     TETRACYCLINE >=16 RESISTANT Resistant     VANCOMYCIN 1 SENSITIVE Sensitive     TRIMETH/SULFA <=10 SENSITIVE Sensitive     CLINDAMYCIN <=0.25 SENSITIVE Sensitive     RIFAMPIN <=0.5 SENSITIVE Sensitive     Inducible Clindamycin NEGATIVE Sensitive     LINEZOLID 2 SENSITIVE Sensitive     * METHICILLIN RESISTANT STAPHYLOCOCCUS AUREUS  Blood Culture ID Panel (Reflexed)     Status: Abnormal   Collection Time: 06/26/22  7:56 PM  Result Value Ref Range Status   Enterococcus faecalis NOT DETECTED NOT DETECTED Final   Enterococcus Faecium NOT DETECTED NOT DETECTED Final   Listeria monocytogenes NOT DETECTED NOT DETECTED Final   Staphylococcus species DETECTED (A) NOT DETECTED Final    Comment: CRITICAL RESULT CALLED TO, READ BACK BY AND VERIFIED WITH: DEVAN MITCHELL 06/27/2022 1124 CP    Staphylococcus aureus (BCID) DETECTED (A) NOT DETECTED Final    Comment: Methicillin (oxacillin)-resistant Staphylococcus  aureus (MRSA). MRSA is predictably resistant to beta-lactam antibiotics (except ceftaroline). Preferred therapy is vancomycin unless clinically contraindicated. Patient requires contact precautions if  hospitalized. CRITICAL RESULT CALLED TO, READ BACK BY AND VERIFIED WITH: DEVAN MITCHELL 06/27/2022 1124 CP    Staphylococcus epidermidis NOT DETECTED NOT DETECTED Final   Staphylococcus lugdunensis NOT DETECTED NOT DETECTED Final   Streptococcus species NOT DETECTED NOT DETECTED Final   Streptococcus agalactiae NOT DETECTED NOT DETECTED Final   Streptococcus pneumoniae NOT DETECTED NOT DETECTED Final   Streptococcus pyogenes NOT DETECTED NOT DETECTED Final   A.calcoaceticus-baumannii NOT DETECTED NOT DETECTED Final   Bacteroides fragilis NOT DETECTED NOT DETECTED Final   Enterobacterales NOT DETECTED NOT DETECTED Final   Enterobacter cloacae complex NOT DETECTED NOT DETECTED Final   Escherichia coli NOT DETECTED NOT DETECTED Final   Klebsiella aerogenes NOT DETECTED NOT DETECTED Final   Klebsiella oxytoca NOT DETECTED NOT DETECTED Final   Klebsiella pneumoniae NOT DETECTED NOT DETECTED Final   Proteus species NOT DETECTED NOT DETECTED Final   Salmonella species NOT DETECTED NOT DETECTED Final   Serratia marcescens NOT DETECTED NOT DETECTED Final   Haemophilus influenzae NOT DETECTED NOT DETECTED Final   Neisseria meningitidis NOT DETECTED NOT DETECTED Final   Pseudomonas aeruginosa NOT DETECTED NOT DETECTED Final   Stenotrophomonas maltophilia NOT DETECTED NOT DETECTED Final   Candida albicans NOT DETECTED NOT DETECTED Final   Candida auris NOT DETECTED NOT DETECTED Final   Candida glabrata NOT DETECTED NOT DETECTED Final   Candida krusei NOT DETECTED NOT DETECTED Final   Candida parapsilosis NOT DETECTED NOT DETECTED Final   Candida tropicalis NOT DETECTED NOT DETECTED Final   Cryptococcus neoformans/gattii NOT DETECTED NOT DETECTED Final   Meth resistant mecA/C and MREJ DETECTED (A)  NOT DETECTED Final    Comment: CRITICAL RESULT CALLED TO, READ BACK BY AND VERIFIED WITH: [facility] MITCHELL 06/27/2022 1124 CP Performed at [facility], [address] [place], [place] [address]   Blood culture (single)     Status: Abnormal   Collection Time: 06/26/22  8:04 PM   Specimen: BLOOD  Result Value Ref Range Status   Specimen Description   Final    BLOOD LEFT ANTECUBITAL Performed at [facility], [address], [place], [place] [address]    Special Requests   Final    BOTTLES DRAWN AEROBIC AND ANAEROBIC Blood Culture adequate volume Performed at [facility], [address] [place], [place] [address]    Culture  Setup Time   Final    GRAM POSITIVE COCCI AEROBIC BOTTLE ONLY Organism ID to follow CRITICAL RESULT CALLED TO, READ BACK BY AND VERIFIED WITH: [email or web address]  06/28/22 RH Performed at [facility], [address] [place], [place] [address]    Culture (A)  Final    STAPHYLOCOCCUS AUREUS SUSCEPTIBILITIES PERFORMED ON PREVIOUS CULTURE WITHIN THE LAST 5 DAYS. Performed at [facility], [address], [place], [place] [address]    Report Status 06/29/2022 FINAL  Final  Blood Culture ID Panel (Reflexed)     Status: Abnormal   Collection Time: 06/26/22  8:04 PM  Result Value Ref Range Status   Enterococcus faecalis NOT DETECTED NOT DETECTED Final   Enterococcus Faecium NOT DETECTED NOT DETECTED Final   Listeria monocytogenes NOT DETECTED NOT DETECTED Final   Staphylococcus species DETECTED (A) NOT DETECTED Final    Comment: CRITICAL RESULT CALLED TO, READ BACK BY AND VERIFIED WITH: NATHAN [email or web address]  06/28/22 RH    Staphylococcus aureus (BCID) DETECTED (A) NOT DETECTED Final    Comment: Methicillin (oxacillin)-resistant Staphylococcus aureus (MRSA). MRSA is predictably resistant to beta-lactam antibiotics (except ceftaroline). Preferred therapy is vancomycin unless clinically contraindicated. Patient requires contact  precautions if  hospitalized. CRITICAL RESULT CALLED TO, READ BACK BY AND VERIFIED WITH: [email or web address]  06/28/22 RH    Staphylococcus epidermidis NOT DETECTED NOT DETECTED Final   Staphylococcus lugdunensis NOT DETECTED NOT DETECTED Final   Streptococcus species NOT DETECTED NOT DETECTED Final   Streptococcus agalactiae NOT DETECTED NOT DETECTED Final   Streptococcus pneumoniae NOT DETECTED NOT DETECTED Final   Streptococcus pyogenes NOT DETECTED NOT DETECTED Final   A.calcoaceticus-baumannii NOT DETECTED NOT DETECTED Final   Bacteroides fragilis NOT DETECTED NOT DETECTED Final   Enterobacterales NOT DETECTED NOT DETECTED Final   Enterobacter cloacae complex NOT DETECTED NOT DETECTED Final   Escherichia coli NOT DETECTED NOT DETECTED Final   Klebsiella aerogenes NOT DETECTED NOT DETECTED Final   Klebsiella oxytoca NOT DETECTED NOT DETECTED Final   Klebsiella pneumoniae NOT DETECTED NOT DETECTED Final   Proteus species NOT DETECTED NOT DETECTED Final   Salmonella species NOT DETECTED NOT DETECTED Final   Serratia marcescens NOT DETECTED NOT DETECTED Final   Haemophilus influenzae NOT DETECTED NOT DETECTED Final   Neisseria meningitidis NOT DETECTED NOT DETECTED Final   Pseudomonas aeruginosa NOT DETECTED NOT DETECTED Final   Stenotrophomonas maltophilia NOT DETECTED NOT DETECTED Final   Candida albicans NOT DETECTED NOT DETECTED Final   Candida auris NOT DETECTED NOT DETECTED Final   Candida glabrata NOT DETECTED NOT DETECTED Final   Candida krusei NOT DETECTED NOT DETECTED Final   Candida parapsilosis NOT DETECTED NOT DETECTED Final   Candida tropicalis NOT DETECTED NOT DETECTED Final   Cryptococcus neoformans/gattii NOT DETECTED NOT DETECTED Final   Meth resistant mecA/C and MREJ DETECTED (A) NOT DETECTED Corrected    Comment: CRITICAL RESULT CALLED TO, READ BACK BY AND VERIFIED WITH: NATHAN [email or web address]  06/28/22 RH Performed at [facility], [address] [place]  [place], [place] [address] CORRECTED ON 05/14 AT 0802: PREVIOUSLY REPORTED AS DETECTED NATHAN [email or web address]  06/28/22 RH   Aerobic/Anaerobic Culture w Gram Stain (surgical/deep wound)     Status: None (Preliminary result)   Collection  Time: 06/27/22  9:09 AM   Specimen: Wound; Tissue  Result Value Ref Range Status   Specimen Description   Final    BONE Performed at [facility], [address] [place], [place] [address]    Special Requests   Final    NONE Performed at [place] [facility], [address] [place], [place] [address]    Gram Stain   Final    NO WBC SEEN NO ORGANISMS SEEN Performed at [facility], [address] [place], [place] [address]    Culture   Final    FEW METHICILLIN RESISTANT STAPHYLOCOCCUS AUREUS FEW ESCHERICHIA COLI NO ANAEROBES ISOLATED; CULTURE IN PROGRESS FOR 5 DAYS    Report Status PENDING  Incomplete   Organism ID, Bacteria METHICILLIN RESISTANT STAPHYLOCOCCUS AUREUS  Final   Organism ID, Bacteria ESCHERICHIA COLI  Final      Susceptibility   Escherichia coli - MIC*    AMPICILLIN <=2 SENSITIVE Sensitive     CEFEPIME <=0.12 SENSITIVE Sensitive     CEFTAZIDIME <=1 SENSITIVE Sensitive     CEFTRIAXONE <=0.25 SENSITIVE Sensitive     CIPROFLOXACIN <=0.25 SENSITIVE Sensitive     GENTAMICIN <=1 SENSITIVE Sensitive     IMIPENEM <=0.25 SENSITIVE Sensitive     TRIMETH/SULFA <=20 SENSITIVE Sensitive     AMPICILLIN/SULBACTAM <=2 SENSITIVE Sensitive     PIP/TAZO <=4 SENSITIVE Sensitive     * FEW ESCHERICHIA COLI   Methicillin resistant staphylococcus aureus - MIC*    CIPROFLOXACIN <=0.5 SENSITIVE Sensitive     ERYTHROMYCIN 0.5 SENSITIVE Sensitive     GENTAMICIN <=0.5 SENSITIVE Sensitive     OXACILLIN >=4 RESISTANT Resistant     TETRACYCLINE >=16 RESISTANT Resistant     VANCOMYCIN <=0.5 SENSITIVE Sensitive     TRIMETH/SULFA <=10 SENSITIVE Sensitive     CLINDAMYCIN <=0.25 SENSITIVE Sensitive     RIFAMPIN <=0.5 SENSITIVE Sensitive     Inducible  Clindamycin NEGATIVE Sensitive     LINEZOLID 2 SENSITIVE Sensitive     * FEW METHICILLIN RESISTANT STAPHYLOCOCCUS AUREUS  Culture, blood (Routine X 2) w Reflex to ID Panel     Status: None (Preliminary result)   Collection Time: 06/29/22  2:50 AM   Specimen: BLOOD  Result Value Ref Range Status   Specimen Description BLOOD RIGHT HAND  Final   Special Requests   Final    BOTTLES DRAWN AEROBIC AND ANAEROBIC Blood Culture adequate volume   Culture   Final    NO GROWTH 2 DAYS Performed at [facility], [address] [place], [place] [address]    Report Status PENDING  Incomplete  Culture, blood (Routine X 2) w Reflex to ID Panel     Status: None (Preliminary result)   Collection Time: 06/29/22  2:50 AM   Specimen: BLOOD  Result Value Ref Range Status   Specimen Description BLOOD RIGHT ARM  Final   Special Requests   Final    BOTTLES DRAWN AEROBIC AND ANAEROBIC Blood Culture adequate volume   Culture   Final    NO GROWTH 2 DAYS Performed at [facility], [address] [place], [place] [address]    Report Status PENDING  Incomplete         Radiology Studies: PERIPHERAL VASCULAR CATHETERIZATION  Result Date: 06/29/2022 See surgical note for result.       Scheduled Meds:  aspirin EC  81 mg Oral Daily   buPROPion  150 mg Oral Daily   insulin aspart  0-15 Units Subcutaneous TID WC   insulin aspart  0-5 Units Subcutaneous QHS   insulin glargine-yfgn  16 Units Subcutaneous Daily   losartan  25 mg Oral Daily   nicotine  21 mg Transdermal Daily   polyethylene glycol  17 g Oral Daily   rosuvastatin  20 mg Oral Daily   Continuous Infusions:  ampicillin-sulbactam (UNASYN) IV 3 g (07/01/22 0533)   heparin 2,600 Units/hr (07/01/22 0328)   vancomycin 1,500 mg (06/30/22 2149)     LOS: 5 days    Time spent: 25 mins     Charise Killian, MD [facility] Pager [phone]  If 7PM-7AM, please contact  night-coverage [email or web address] 07/01/2022, 7:56 AM

## 2022-07-01 NOTE — Progress Notes (Signed)
Patient transported to TEE.

## 2022-07-01 NOTE — Progress Notes (Signed)
Date of Admission:  06/26/2022     ID: Martin Mullen is a 42 y.o. 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)   Recurrent strokes (HCC)   Cellulitis of right foot   History of Fournier's gangrene   Chronic anticoagulation   Diabetic foot ulcer associated with type 2 diabetes mellitus (HCC)   Plantar ulcer of right foot, with fat layer exposed (HCC)   PAD (peripheral artery disease) (HCC)   Hypokalemia   MRSA bacteremia   Diabetic infection of right foot (HCC)   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)   Open wound   Ulcerated, foot, left, with fat layer exposed (HCC)    Subjective: Doing well Had TEE today No endocarditis  Medications:   aspirin EC  81 mg Oral Daily   buPROPion  150 mg Oral Daily   insulin aspart  0-15 Units Subcutaneous TID WC   insulin aspart  0-5 Units Subcutaneous QHS   insulin glargine-yfgn  16 Units Subcutaneous Daily   losartan  25 mg Oral Daily   nicotine  21 mg Transdermal Daily   polyethylene glycol  17 g Oral Daily   rosuvastatin  20 mg Oral Daily    Objective: Vital signs in last 24 hours: Patient Vitals for the past 24 hrs:  BP Temp Temp src Pulse Resp SpO2 Height Weight  07/01/22 0740 (!) 140/91 98 F (36.7 C) Oral 87 17 94 % -- --  07/01/22 0418 (!) 134/91 97.7 F (36.5 C) Oral 95 18 94 % -- --  06/30/22 2100 120/85 98.7 F (37.1 C) Oral 95 18 94 % -- --  06/30/22 1541 115/79 97.9 F (36.6 C) Oral 95 18 94 % -- --  06/30/22 1400 102/68 98.2 F (36.8 C) -- 85 12 94 % -- --  06/30/22 1336 -- -- -- 84 12 94 % -- --  06/30/22 1330 108/69 -- -- 87 12 95 % -- --  06/30/22 1319 113/74 97.8 F (36.6 C) -- 85 14 100 % -- --   06/30/22 1129 128/86 98.4 F (36.9 C) Temporal 90 16 96 % -- --  06/30/22 1116 -- -- -- -- -- -- 5\' 10"  (1.778 m) 92.5 kg      PHYSICAL EXAM:  General: Alert, cooperative, no distress,  Lungs: Clear to auscultation bilaterally. No Wheezing or Rhonchi. No rales. Heart: Regular rate and rhythm, no murmur, rub or gallop. Abdomen: Soft, non-tender,not distended. Bowel sounds normal. 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 Or bruising Lymph: Cervical, supraclavicular normal. 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  Lab Results    Latest Ref Rng & Units 07/01/2022    5:12 AM 06/30/2022    2:03 AM 06/29/2022    2:50 AM  CBC  WBC 4.0 - 10.5 K/uL 20.3  15.0  18.0   Hemoglobin 13.0 - 17.0 g/dL 16.1  09.6  04.5   Hematocrit 39.0 - 52.0 % 37.8  39.2  39.5   Platelets 150 - 400 K/uL 337  283  286  Latest Ref Rng & Units 07/01/2022    5:12 AM 06/30/2022    2:03 AM 06/29/2022   12:40 PM  CMP  Glucose 70 - 99 mg/dL 284  132  440   BUN 6 - 20 mg/dL 14  9  9    Creatinine 0.61 - 1.24 mg/dL 1.02  7.25  3.66   Sodium 135 - 145 mmol/L 134  131  133   Potassium 3.5 - 5.1 mmol/L 4.4  4.1  4.0   Chloride 98 - 111 mmol/L 102  98  100   CO2 22 - 32 mmol/L 23  23  23    Calcium 8.9 - 10.3 mg/dL 8.6  8.4  8.2       Microbiology: Blood culture 1 out of 4 MRSA Wound cultures so far Staph aureus Studies/Results: No results found.   Assessment/Plan:  ?MRSA bacteremia- source is the foot He is currently on vancomycin  repeat blood cultures 06/29/22 ng so far TEE no endocarditis   Rt foot infection- 2nd toe abscess/osteo S/p I/D , 2nd toe ray excision Culture MRSA and ecoli Pathology acute osteo extending to proximal margin of 2 nd toe 3d toe biopsy no osteo  Will need long term antibiotic-  As no endocarditis, after a week to 10 days of Iv  vanco, will give long acting dalbavancin and PO  Pt currently on vanco and unasyn  Not a candidate for home IV due to social circumstances    Diabetes mellitus with peripheral neuropathy with DFI Callus left foot On metformin and insulin  HTN   CAD s/p stent  Recurrent CVA due to prothrombin gene mutation on eliquis   Current smoker   ? Discussed the management with the patient and care team Seen patient with Dr.Standiford ID will follow him peripherally this weekend

## 2022-07-01 NOTE — Transfer of Care (Signed)
Immediate Anesthesia Transfer of Care Note  Patient: Martin Mullen  Procedure(s) Performed: TRANSESOPHAGEAL ECHOCARDIOGRAM  Patient Location: PACU  Anesthesia Type:General  Level of Consciousness: drowsy  Airway & Oxygen Therapy: Patient Spontanous Breathing and Patient connected to nasal cannula oxygen  Post-op Assessment: Report given to RN and Post -op Vital signs reviewed and stable  Post vital signs: Reviewed and stable  Last Vitals:  Vitals Value Taken Time  BP 99/58 07/01/22 1356  Temp    Pulse 76 07/01/22 1357  Resp 24 07/01/22 1357  SpO2 93 % 07/01/22 1357    Last Pain:  Vitals:   07/01/22 1215  TempSrc: Oral  PainSc: 0-No pain      Patients Stated Pain Goal: 0 (06/30/22 1116)  Complications: No notable events documented.

## 2022-07-01 NOTE — Anesthesia Preprocedure Evaluation (Signed)
Anesthesia Evaluation  Patient identified by MRN, date of birth, ID band Patient awake    Reviewed: Allergy & Precautions, H&P , NPO status , Patient's Chart, lab work & pertinent test results  History of Anesthesia Complications Negative for: history of anesthetic complications  Airway Mallampati: I  TM Distance: >3 FB Neck ROM: Full    Dental  (+) Edentulous Upper, Edentulous Lower, Dental Advidsory Given   Pulmonary neg shortness of breath, neg COPD, neg recent URI, Current Smoker and Patient abstained from smoking.   Pulmonary exam normal        Cardiovascular hypertension, (-) angina + CAD, + Past MI, + Cardiac Stents (2014) and + Peripheral Vascular Disease  (-) CABG Normal cardiovascular exam(-) dysrhythmias (-) Valvular Problems/Murmurs  Study Conclusions   - Left ventricle: The cavity size was normal. There was mild  concentric hypertrophy. Systolic function was normal. The  estimated ejection fraction was in the range of 55% to 60%. Wall  motion was normal; there were no regional wall motion  abnormalities.  - Left atrium: The atrium was mildly dilated.    Neuro/Psych  PSYCHIATRIC DISORDERS      negative neurological ROS  negative psych ROS   GI/Hepatic negative GI ROS, Neg liver ROS,GERD  ,,  Endo/Other  diabetes, Poorly Controlled    Renal/GU negative Renal ROS  negative genitourinary   Musculoskeletal   Abdominal   Peds  Hematology negative hematology ROS (+)   Anesthesia Other Findings Patient took his ozempic on 5/10.   Past Medical History: 08/28/2012: Acute transmural inferior wall MI (HCC)     Comment:  .5 x 12 mm Veri-flex stent non-DES. No date: Chicken pox No date: Coronary artery disease     Comment:  Inferior ST elevation myocardial infarction in July of               2014. Cardiac catheterization showed 95% distal RCA               stenosis and 90% first diagonal stenosis with  normal               ejection fraction. He underwent PCI in bare-metal stent               placement to the distal RCA. No date: Diabetes mellitus without complication (HCC) No date: Hypercholesterolemia No date: Stroke [facility]) No date: Tobacco use  Past Surgical History: No date: CORONARY ANGIOPLASTY WITH STENT PLACEMENT 09/03/2019: DEBRIDEMENT AND CLOSURE WOUND; N/A     Comment:  Procedure: Debridement and closure of scrotal wound;                Surgeon: Allena Napoleon, MD;  Location: [facility];  Service: Plastics;  Laterality: N/A; 07/25/2019: DEBRIDEMENT AND CLOSURE WOUND; N/A     Comment:  Procedure: DEBRIDEMENT AND CLOSURE WOUND;  Surgeon:               Allena Napoleon, MD;  Location: [facility];  Service:               Plastics;  Laterality: N/A; 07/20/2019: INCISION AND DRAINAGE ABSCESS; N/A     Comment:  Procedure: INCISION AND DRAINAGE ABSCESS;  Surgeon:               Crista Elliot, MD;  Location: [facility];  Service:  Urology;  Laterality: N/A; 08/28/2012: LEFT HEART CATHETERIZATION WITH CORONARY ANGIOGRAM; N/A     Comment:  Procedure: LEFT HEART CATHETERIZATION WITH CORONARY               ANGIOGRAM;  Surgeon: Pamella Pert, MD;  Location: [facility]              CATH LAB;  Service: Cardiovascular;  Laterality: N/A; 09/23/2020: TEE WITHOUT CARDIOVERSION; N/A     Comment:  Procedure: TRANSESOPHAGEAL ECHOCARDIOGRAM (TEE);                Surgeon: Lamar Blinks, MD;  Location: [facility];                Service: Cardiovascular;  Laterality: N/A; 1988: WRIST SURGERY  BMI    Body Mass Index: 29.26 kg/m      Reproductive/Obstetrics negative OB ROS                             Anesthesia Physical Anesthesia Plan  ASA: 3  Anesthesia Plan: General   Post-op Pain Management:    Induction: Intravenous  PONV Risk Score and Plan: 3 and Propofol infusion and TIVA  Airway Management Planned: Natural Airway and  Simple Face Mask  Additional Equipment:   Intra-op Plan:   Post-operative Plan:   Informed Consent: I have reviewed the patients History and Physical, chart, labs and discussed the procedure including the risks, benefits and alternatives for the proposed anesthesia with the patient or authorized representative who has indicated his/her understanding and acceptance.     Dental Advisory Given  Plan Discussed with: Anesthesiologist and CRNA  Anesthesia Plan Comments: (Patient consented for risks of anesthesia including but not limited to:  - adverse reactions to medications - risk of airway placement if required - damage to eyes, teeth, lips or other oral mucosa - nerve damage due to positioning  - sore throat or hoarseness - Damage to heart, brain, nerves, lungs, other parts of body or loss of life  Patient voiced understanding.)        Anesthesia Quick Evaluation

## 2022-07-01 NOTE — Progress Notes (Signed)
Transesophageal Echocardiogram :  Indication: Bacteremia, rule out endocarditis Requesting/ordering  physician: Mayford Knife  Procedure: Benzocaine spray x2 and 2 mls x 2 of viscous lidocaine were given orally to provide local anesthesia to the oropharynx. The patient was positioned supine on the left side, bite block provided. The patient was moderately sedated with the doses of versed and fentanyl as detailed below.  Using digital technique an omniplane probe was advanced into the distal esophagus without incident.   Moderate sedation: 1. Sedation used: Propofol per anesthesia  See report in EPIC  for complete details: In brief,  No valve vegetation/endocarditis noted transgastric imaging revealed normal LV function with no RWMAs and no mural apical thrombus.  .  Estimated ejection fraction was 55%.  Right sided cardiac chambers were normal with no evidence of pulmonary hypertension.  Imaging of the septum showed no ASD or VSD Bubble study was negative for shunt 2D and color flow confirmed no PFO  Thickening of the noncoronary aortic cusp, with mild calcification, unable to exclude prolapse  The LA was well visualized in orthogonal views.  There was no spontaneous contrast and no thrombus in the LA and LA appendage   The descending thoracic aorta had no  mural aortic debris with no evidence of aneurysmal dilation or disection   Martin Mullen 07/01/2022 3:35 PM

## 2022-07-01 NOTE — Progress Notes (Addendum)
Patient returned from specials/TEE. Order received from Dr Mayford Knife to resume carb diet

## 2022-07-01 NOTE — Progress Notes (Signed)
*  PRELIMINARY RESULTS* Echocardiogram Echocardiogram Transesophageal has been performed.  Cristela Blue 07/01/2022, 2:14 PM

## 2022-07-01 NOTE — Progress Notes (Signed)
Pharmacy Antibiotic Note  Martin Mullen is a 42 y.o. male admitted on 06/26/2022 with MRSA bacteremia from diabetic foot ulcer and osteomyelitis.  Pharmacy has been consulted for Vancomycin and Cefepime  dosing He is s/p I&D and right 2nd toe amputation on 5/13  Today, 07/01/2022 Day #5 antibiotics - currently on  vancomycin ampicillin/sulbactam (previously on cefepime)  Renal: SCr stable and WNL WBC 20.3 (? Increase overnight reactive related to I&D 5/16) Afebile 5/12 blood cx: 2/2 sets MRSA 5/15 repeat Bcx NGTD 5/13 Bone: MRSA (susc bactrim, linezolid), E coli (Pan-susc) ECHO 5/14 - no vegetation Planning for TEE Angioplasty and stent 5/15  Vancomycin Levels: On vancomycin 1500mg  IV q12h (dose given 5/15 at 22:00) Vancomycin peak 5/16 at02:03 = 24 mcg/mL Vancomycin trough 5/16 at 10:06 = 13 mcg/mL AUC = 475.8, Cmax = 28.1 Cmin = 13.1  Plan: Continue Vancomycin 1500 mg IV Q12H (goal AUC 400-600) AUC at goal per levels 5/16 (as above) Ampicillin/sulbactam 3gm IV q6h Await TEE and antibiotic plan - ability to change to PO if no evidence of endocarditis on TEE Follow renal function   Height: 5\' 10"  (177.8 cm) Weight: 92.5 kg (203 lb 14.8 oz) IBW/kg (Calculated) : 73  Temp (24hrs), Avg:98.1 F (36.7 C), Min:97.7 F (36.5 C), Max:98.7 F (37.1 C)  Recent Labs  Lab 06/26/22 1956 06/26/22 2319 06/27/22 0511 06/28/22 0559 06/29/22 0250 06/29/22 1240 06/30/22 0203 06/30/22 1006 07/01/22 0512  WBC 23.7*  --  22.2* 17.1* 18.0*  --  15.0*  --  20.3*  CREATININE 0.94  --  0.82 0.87  --  0.83 0.82  --  0.78  LATICACIDVEN 2.8* 1.4  --   --   --   --   --   --   --   VANCOTROUGH  --   --   --   --   --   --   --  13*  --   VANCOPEAK  --   --   --   --   --   --  24*  --   --      Estimated Creatinine Clearance: 137.5 mL/min (by C-G formula based on SCr of 0.78 mg/dL).    No Known Allergies  Antimicrobials this admission:   Vancomycin 5/13 >>    Cefepime 5/13 >> 5/16    Ampicillin/sulb 5/16 >>  Dose adjustments this admission:  Microbiology results: 5/12 blood cx: 2/2 sets MRSA 5/15 repeat Bcx NGTD 5/13 Bone:MRSA and E coli (pan-susc)  Thank you for allowing pharmacy to be a part of this patient's care.  Juliette Alcide, PharmD, BCPS, BCIDP Work Cell: [phone] 07/01/2022 8:12 AM

## 2022-07-01 NOTE — Anesthesia Postprocedure Evaluation (Signed)
Anesthesia Post Note  Patient: Martin Mullen  Procedure(s) Performed: TRANSESOPHAGEAL ECHOCARDIOGRAM  Patient location during evaluation: Specials Recovery Anesthesia Type: General Level of consciousness: awake and alert Pain management: pain level controlled Vital Signs Assessment: post-procedure vital signs reviewed and stable Respiratory status: spontaneous breathing, nonlabored ventilation, respiratory function stable and patient connected to nasal cannula oxygen Cardiovascular status: blood pressure returned to baseline and stable Postop Assessment: no apparent nausea or vomiting Anesthetic complications: no   No notable events documented.   Last Vitals:  Vitals:   07/01/22 1430 07/01/22 1435  BP: (!) 128/90   Pulse: 80 88  Resp: 14 16  Temp:    SpO2: 90% 91%    Last Pain:  Vitals:   07/01/22 1215  TempSrc: Oral  PainSc: 0-No pain                 [name]

## 2022-07-02 DIAGNOSIS — M8608 Acute hematogenous osteomyelitis, other sites: Secondary | ICD-10-CM | POA: Diagnosis not present

## 2022-07-02 DIAGNOSIS — I739 Peripheral vascular disease, unspecified: Secondary | ICD-10-CM | POA: Diagnosis not present

## 2022-07-02 DIAGNOSIS — B9562 Methicillin resistant Staphylococcus aureus infection as the cause of diseases classified elsewhere: Secondary | ICD-10-CM | POA: Diagnosis not present

## 2022-07-02 DIAGNOSIS — R7881 Bacteremia: Secondary | ICD-10-CM | POA: Diagnosis not present

## 2022-07-02 LAB — BASIC METABOLIC PANEL
Anion gap: 7 (ref 5–15)
BUN: 13 mg/dL (ref 6–20)
CO2: 25 mmol/L (ref 22–32)
Calcium: 8.3 mg/dL — ABNORMAL LOW (ref 8.9–10.3)
Chloride: 104 mmol/L (ref 98–111)
Creatinine, Ser: 0.78 mg/dL (ref 0.61–1.24)
GFR, Estimated: >60 mL/min (ref >60)
Glucose, Bld: 268 mg/dL — ABNORMAL HIGH (ref 70–99)
Potassium: 3.9 mmol/L (ref 3.5–5.1)
Sodium: 136 mmol/L (ref 135–145)

## 2022-07-02 LAB — APTT: aPTT: 105 seconds — ABNORMAL HIGH (ref 24–36)

## 2022-07-02 LAB — AEROBIC/ANAEROBIC CULTURE W GRAM STAIN (SURGICAL/DEEP WOUND)

## 2022-07-02 LAB — GLUCOSE, CAPILLARY
Glucose-Capillary: 236 mg/dL — ABNORMAL HIGH (ref 70–99)
Glucose-Capillary: 110 mg/dL — ABNORMAL HIGH (ref 70–99)
Glucose-Capillary: 292 mg/dL — ABNORMAL HIGH (ref 70–99)

## 2022-07-02 LAB — CBC
HCT: 36.4 % — ABNORMAL LOW (ref 39.0–52.0)
Hemoglobin: 12.3 g/dL — ABNORMAL LOW (ref 13.0–17.0)
MCH: 30.4 pg (ref 26.0–34.0)
MCHC: 33.8 g/dL (ref 30.0–36.0)
MCV: 89.9 fL (ref 80.0–100.0)
Platelets: 322 10*3/uL (ref 150–400)
RBC: 4.05 MIL/uL — ABNORMAL LOW (ref 4.22–5.81)
RDW: 12.6 % (ref 11.5–15.5)
WBC: 13.9 10*3/uL — ABNORMAL HIGH (ref 4.0–10.5)
nRBC: 0 % (ref 0.0–0.2)

## 2022-07-02 LAB — HEPARIN LEVEL (UNFRACTIONATED): Heparin Unfractionated: 0.62 IU/mL (ref 0.30–0.70)

## 2022-07-02 MED ORDER — APIXABAN 5 MG PO TABS
5.0000 mg | ORAL_TABLET | Freq: Two times a day (BID) | ORAL | Status: DC
  Administered 2022-07-02 – 2022-07-06 (×9): 5 mg via ORAL
  Filled 2022-07-02 (×9): qty 1

## 2022-07-02 NOTE — Consult Note (Signed)
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   No Known Allergies  Patient Measurements: Height: 5\' 10"  (177.8 cm) Weight: 92.5 kg (203 lb 14.8 oz) IBW/kg (Calculated) : 73 Heparin Dosing Weight: 91.6 kg  Vital Signs: Temp: 98 F (36.7 C) (05/18 0439) Temp Source: Oral (05/18 0439) BP: 116/63 (05/18 0439) Pulse Rate: 94 (05/18 0439)  Labs: Recent Labs    06/30/22 0203 06/30/22 1006 07/01/22 0512 07/01/22 1318 07/02/22 0446  HGB 13.4  --  13.3  --  12.3*  HCT 39.2  --  37.8*  --  36.4*  PLT 283  --  337  --  322  APTT 52*   < > 93* 95* 105*  HEPARINUNFRC 0.26*  --  0.60 0.71* 0.62  CREATININE 0.82  --  0.78  --  0.78   < > = values in this interval not displayed.     Estimated Creatinine Clearance: 137.5 mL/min (by C-G formula based on SCr of 0.78 mg/dL).   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   Chicken pox    Coronary artery disease    Inferior ST elevation myocardial infarction in July of 2014. Cardiac catheterization showed 95% distal RCA stenosis and 90% first diagonal stenosis with normal ejection fraction. He underwent PCI in bare-metal stent placement to the distal RCA.   Diabetes mellitus without complication (HCC)    Hypercholesterolemia    Stroke (HCC)    Tobacco use     Pertinent Medications:  PTA: Apixaban 5mg  BID, last dose: 5/13@2115   Assessment: SO is a 42 y.o. 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  He presented to the hospital because of pain in the right leg. Plan for right lower extremity angiogram tomorrow, 5/15. Will hold  apixaban and start patient no heparin infusion.  Baseline labs: aPTT 32 sec, INR 1.2, Plts 277, Hgb 14.9  Goal of Therapy:  Heparin level 0.3-0.7 units/ml aPTT 66-102 seconds Monitor platelets by anticoagulation protocol: Yes   Plan:  Heparin level therapeutic Continue heparin infusion rate at 2600 units/hr Recheck HL daily while therapeutic on heparin CBC daily while on heparin  Martin Mullen, PharmD, [facility] 07/02/2022 5:57 AM

## 2022-07-02 NOTE — Progress Notes (Signed)
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)   Recurrent strokes (HCC)   History of Fournier's gangrene   Chronic anticoagulation   Plantar ulcer of right foot, with fat layer exposed (HCC)   PAD (peripheral artery disease) (HCC)   Hypokalemia   MRSA bacteremia   Diabetic infection of right foot (HCC)   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 S/p washout I&D of right foot infection on 06/27/2022. S/p debridement, graft, & VAC application 06/30/22 as per podiatry. Continue on IV vanco, unasyn as per ID. Podiatry & ID following and recs apprec. See Dr. Helayne Seminole notes on how pt met sepsis criteria. Sepsis resolved   MRSA bacteremia: continue on IV vanco, unasyn as per ID. TEE was neg for endocarditis.    Hypokalemia: WNL today   Hyponatremia: resolved    Hx of CAD: w/ bare-metal stent to distal RCA in 2014. Continue on statin, aspirin   PVD: s/p RLE angiogram in which angioplasty of right popliteal artery, right tibioperoneal trunk, posterior tibial artery was done & stent placed into the right popliteal artery on 06/29/22. Continue on aspirin, statin     Heterozygous for prothrombin gene 20210A mutation: w/ recurrent strokes. Can restart eliquis and stop IV heparin drip    DMII: HbA1c 8.2, poorly controlled. Continue on glargine, SSI w/ accuchecks    Overweight: BMI 29.2. Would benefit from weight loss     DVT prophylaxis: IV heparin  Code Status: full  Family Communication:  Disposition Plan: likely d/c back home  Level of care: Telemetry Medical  Status is: Inpatient Remains inpatient appropriate because: severity of illness, still requiring IV abxs as per ID     Consultants:  Vasc surg ID  Cardio   Procedures:   Antimicrobials: unasyn, vanco   Subjective: Pt c/o malaise    Objective: Vitals:   07/01/22 1445 07/01/22 1522 07/01/22 1948 07/02/22 0439  BP: 106/65 129/77 123/80 116/63  Pulse: 80 90 97 94  Resp: 12 14 16 16   Temp:  97.9 F (36.6 C) 97.7 F (36.5 C) 98 F (36.7 C)  TempSrc:   Oral Oral  SpO2: 91% 90% 92% 95%  Weight:      Height:        Intake/Output Summary (Last 24 hours) at 07/02/2022 0804 Last data filed at 07/02/2022 0333 Gross per 24 hour  Intake 1278.79 ml  Output 1750 ml  Net -471.21 ml   Filed Weights   06/26/22 1838 06/30/22 1116  Weight: 92.5 kg 92.5 kg    Examination:  General exam: Appears comfortable  Respiratory system: clear breath sounds b/l Cardiovascular system: S1/S2+. No rubs or gallops Gastrointestinal system: abd  is soft, NT, ND & hypoactive bowel sounds   Central nervous system: Awake. Moves all extremities Psychiatry: judgement and insight appears at baseline. Flat mood and affect    Data Reviewed: I have personally reviewed following labs and imaging studies  CBC: Recent Labs  Lab 06/26/22 1956 06/27/22 0511 06/28/22 0559 06/29/22 0250 06/30/22 0203 07/01/22 0512 07/02/22 0446  WBC 23.7*   < > 17.1* 18.0* 15.0* 20.3* 13.9*  NEUTROABS 20.2*  --  12.9*  --   --   --   --   HGB 15.6   < > 14.9 13.9 13.4 13.3 12.3*  HCT 45.5   < > 44.3 39.5  39.2 37.8* 36.4*  MCV 89.9   < > 90.8 87.8 88.3 88.7 89.9  PLT 321   < > 277 286 283 337 322   < > = values in this interval not displayed.   Basic Metabolic Panel: Recent Labs  Lab 06/28/22 0559 06/29/22 1240 06/30/22 0203 07/01/22 0512 07/02/22 0446  NA 135 133* 131* 134* 136  K 3.6 4.0 4.1 4.4 3.9  CL 101 100 98 102 104  CO2 24 23 23 23 25   GLUCOSE 153* 127* 198* 277* 268*  BUN 8 9 9 14 13   CREATININE 0.87 0.83 0.82 0.78 0.78  CALCIUM 8.4* 8.2* 8.4* 8.6* 8.3*  MG 1.8  --   --   --   --    GFR: Estimated Creatinine Clearance: 137.5 mL/min (by C-G formula based on SCr of 0.78 mg/dL). Liver Function Tests: Recent Labs  Lab 06/26/22 1956  AST 13*  ALT 8  ALKPHOS 125  BILITOT 1.0  PROT 7.8  ALBUMIN 3.4*   No results for input(s): "LIPASE", "AMYLASE" in the last 168 hours. No results for input(s): "AMMONIA" in the last 168 hours. Coagulation Profile: Recent Labs  Lab 06/27/22 0511 06/28/22 1116  INR 1.3* 1.2   Cardiac Enzymes: No results for input(s): "CKTOTAL", "CKMB", "CKMBINDEX", "TROPONINI" in the last 168 hours. BNP (last 3 results) No results for input(s): "PROBNP" in the last 8760 hours. HbA1C: No results for input(s): "HGBA1C" in the last 72 hours. CBG: Recent Labs  Lab 07/01/22 0744 07/01/22 1123 07/01/22 1720 07/01/22 2114 07/02/22 0748  GLUCAP 254* 189* 226* 311* 236*   Lipid Profile: No results for input(s): "CHOL", "HDL", "LDLCALC", "TRIG", "CHOLHDL", "LDLDIRECT" in the last 72 hours. Thyroid Function Tests: No results for input(s): "TSH", "T4TOTAL", "FREET4", "T3FREE", "THYROIDAB" in the last 72 hours. Anemia Panel: No results for input(s): "VITAMINB12", "FOLATE", "FERRITIN", "TIBC", "IRON", "RETICCTPCT" in the last 72 hours. Sepsis Labs: Recent Labs  Lab 06/26/22 1956 06/26/22 2319 06/27/22 0511  PROCALCITON  --   --  0.46  LATICACIDVEN 2.8* 1.4  --     Recent Results (from the past 240 hour(s))  Blood culture (single)      Status: Abnormal (Preliminary result)   Collection Time: 06/26/22  7:56 PM   Specimen: BLOOD  Result Value Ref Range Status   Specimen Description   Final    BLOOD RIGHT ANTECUBITAL Performed at [facility], [address] [place], [place] [address]    Special Requests   Final    BOTTLES DRAWN AEROBIC AND ANAEROBIC Blood Culture adequate volume Performed at [facility], [address] [place], [place] [address]    Culture  Setup Time   Final    GRAM POSITIVE COCCI AEROBIC BOTTLE ONLY CRITICAL RESULT CALLED TO, READ BACK BY AND VERIFIED WITH: [facility] MITCHELL 06/27/2022 1124  CP Performed at [facility], [address], [place], [place] [address]    Culture METHICILLIN RESISTANT STAPHYLOCOCCUS AUREUS (A)  Final   Report Status PENDING  Incomplete   Organism ID, Bacteria METHICILLIN RESISTANT STAPHYLOCOCCUS AUREUS  Final      Susceptibility   Methicillin resistant staphylococcus aureus - MIC*    CIPROFLOXACIN <=0.5 SENSITIVE Sensitive     ERYTHROMYCIN 0.5 SENSITIVE Sensitive     GENTAMICIN <=0.5 SENSITIVE Sensitive     OXACILLIN >=4 RESISTANT Resistant     TETRACYCLINE >=16 RESISTANT Resistant     VANCOMYCIN 1 SENSITIVE Sensitive     TRIMETH/SULFA <=10 SENSITIVE Sensitive     CLINDAMYCIN <=0.25 SENSITIVE Sensitive     RIFAMPIN <=0.5 SENSITIVE Sensitive     Inducible Clindamycin NEGATIVE Sensitive     LINEZOLID 2 SENSITIVE Sensitive     * METHICILLIN RESISTANT STAPHYLOCOCCUS AUREUS  Blood Culture ID Panel (Reflexed)     Status: Abnormal   Collection Time: 06/26/22  7:56 PM  Result Value Ref Range Status   Enterococcus faecalis NOT DETECTED NOT DETECTED Final   Enterococcus Faecium NOT DETECTED NOT DETECTED Final   Listeria monocytogenes NOT DETECTED NOT DETECTED Final   Staphylococcus species DETECTED (A) NOT DETECTED Final    Comment: CRITICAL RESULT CALLED TO, READ BACK BY AND VERIFIED WITH: DEVAN MITCHELL 06/27/2022 1124 CP    Staphylococcus  aureus (BCID) DETECTED (A) NOT DETECTED Final    Comment: Methicillin (oxacillin)-resistant Staphylococcus aureus (MRSA). MRSA is predictably resistant to beta-lactam antibiotics (except ceftaroline). Preferred therapy is vancomycin unless clinically contraindicated. Patient requires contact precautions if  hospitalized. CRITICAL RESULT CALLED TO, READ BACK BY AND VERIFIED WITH: DEVAN MITCHELL 06/27/2022 1124 CP    Staphylococcus epidermidis NOT DETECTED NOT DETECTED Final   Staphylococcus lugdunensis NOT DETECTED NOT DETECTED Final   Streptococcus species NOT DETECTED NOT DETECTED Final   Streptococcus agalactiae NOT DETECTED NOT DETECTED Final   Streptococcus pneumoniae NOT DETECTED NOT DETECTED Final   Streptococcus pyogenes NOT DETECTED NOT DETECTED Final   A.calcoaceticus-baumannii NOT DETECTED NOT DETECTED Final   Bacteroides fragilis NOT DETECTED NOT DETECTED Final   Enterobacterales NOT DETECTED NOT DETECTED Final   Enterobacter cloacae complex NOT DETECTED NOT DETECTED Final   Escherichia coli NOT DETECTED NOT DETECTED Final   Klebsiella aerogenes NOT DETECTED NOT DETECTED Final   Klebsiella oxytoca NOT DETECTED NOT DETECTED Final   Klebsiella pneumoniae NOT DETECTED NOT DETECTED Final   Proteus species NOT DETECTED NOT DETECTED Final   Salmonella species NOT DETECTED NOT DETECTED Final   Serratia marcescens NOT DETECTED NOT DETECTED Final   Haemophilus influenzae NOT DETECTED NOT DETECTED Final   Neisseria meningitidis NOT DETECTED NOT DETECTED Final   Pseudomonas aeruginosa NOT DETECTED NOT DETECTED Final   Stenotrophomonas maltophilia NOT DETECTED NOT DETECTED Final   Candida albicans NOT DETECTED NOT DETECTED Final   Candida auris NOT DETECTED NOT DETECTED Final   Candida glabrata NOT DETECTED NOT DETECTED Final   Candida krusei NOT DETECTED NOT DETECTED Final   Candida parapsilosis NOT DETECTED NOT DETECTED Final   Candida tropicalis NOT DETECTED NOT DETECTED Final    Cryptococcus neoformans/gattii NOT DETECTED NOT DETECTED Final   Meth resistant mecA/C and MREJ DETECTED (A) NOT DETECTED Final    Comment: CRITICAL RESULT CALLED TO, READ BACK BY AND VERIFIED WITH: [facility] MITCHELL 06/27/2022 1124 CP Performed at [facility], [address] [place], [place] [address]   Blood culture (single)     Status: Abnormal  Collection Time: 06/26/22  8:04 PM   Specimen: BLOOD  Result Value Ref Range Status   Specimen Description   Final    BLOOD LEFT ANTECUBITAL Performed at [facility], [address], [place], [place] [address]    Special Requests   Final    BOTTLES DRAWN AEROBIC AND ANAEROBIC Blood Culture adequate volume Performed at [facility], [address] [place], [place] [address]    Culture  Setup Time   Final    GRAM POSITIVE COCCI AEROBIC BOTTLE ONLY Organism ID to follow CRITICAL RESULT CALLED TO, READ BACK BY AND VERIFIED WITH: NATHAN [email or web address]  06/28/22 RH Performed at [facility], [address] [place], [place] [address]    Culture (A)  Final    STAPHYLOCOCCUS AUREUS SUSCEPTIBILITIES PERFORMED ON PREVIOUS CULTURE WITHIN THE LAST 5 DAYS. Performed at [facility], [address], [place], [place] [address]    Report Status 06/29/2022 FINAL  Final  Blood Culture ID Panel (Reflexed)     Status: Abnormal   Collection Time: 06/26/22  8:04 PM  Result Value Ref Range Status   Enterococcus faecalis NOT DETECTED NOT DETECTED Final   Enterococcus Faecium NOT DETECTED NOT DETECTED Final   Listeria monocytogenes NOT DETECTED NOT DETECTED Final   Staphylococcus species DETECTED (A) NOT DETECTED Final    Comment: CRITICAL RESULT CALLED TO, READ BACK BY AND VERIFIED WITH: NATHAN [email or web address]  06/28/22 RH    Staphylococcus aureus (BCID) DETECTED (A) NOT DETECTED Final    Comment: Methicillin (oxacillin)-resistant Staphylococcus aureus (MRSA). MRSA is predictably resistant to beta-lactam antibiotics  (except ceftaroline). Preferred therapy is vancomycin unless clinically contraindicated. Patient requires contact precautions if  hospitalized. CRITICAL RESULT CALLED TO, READ BACK BY AND VERIFIED WITH: [email or web address]  06/28/22 RH    Staphylococcus epidermidis NOT DETECTED NOT DETECTED Final   Staphylococcus lugdunensis NOT DETECTED NOT DETECTED Final   Streptococcus species NOT DETECTED NOT DETECTED Final   Streptococcus agalactiae NOT DETECTED NOT DETECTED Final   Streptococcus pneumoniae NOT DETECTED NOT DETECTED Final   Streptococcus pyogenes NOT DETECTED NOT DETECTED Final   A.calcoaceticus-baumannii NOT DETECTED NOT DETECTED Final   Bacteroides fragilis NOT DETECTED NOT DETECTED Final   Enterobacterales NOT DETECTED NOT DETECTED Final   Enterobacter cloacae complex NOT DETECTED NOT DETECTED Final   Escherichia coli NOT DETECTED NOT DETECTED Final   Klebsiella aerogenes NOT DETECTED NOT DETECTED Final   Klebsiella oxytoca NOT DETECTED NOT DETECTED Final   Klebsiella pneumoniae NOT DETECTED NOT DETECTED Final   Proteus species NOT DETECTED NOT DETECTED Final   Salmonella species NOT DETECTED NOT DETECTED Final   Serratia marcescens NOT DETECTED NOT DETECTED Final   Haemophilus influenzae NOT DETECTED NOT DETECTED Final   Neisseria meningitidis NOT DETECTED NOT DETECTED Final   Pseudomonas aeruginosa NOT DETECTED NOT DETECTED Final   Stenotrophomonas maltophilia NOT DETECTED NOT DETECTED Final   Candida albicans NOT DETECTED NOT DETECTED Final   Candida auris NOT DETECTED NOT DETECTED Final   Candida glabrata NOT DETECTED NOT DETECTED Final   Candida krusei NOT DETECTED NOT DETECTED Final   Candida parapsilosis NOT DETECTED NOT DETECTED Final   Candida tropicalis NOT DETECTED NOT DETECTED Final   Cryptococcus neoformans/gattii NOT DETECTED NOT DETECTED Final   Meth resistant mecA/C and MREJ DETECTED (A) NOT DETECTED Corrected    Comment: CRITICAL RESULT CALLED TO, READ BACK BY  AND VERIFIED WITH: NATHAN [email or web address]  06/28/22 RH Performed at [facility], [address], [place], [place] [address] CORRECTED  ON 05/14 AT 0802: PREVIOUSLY REPORTED AS DETECTED NATHAN [email or web address]@[email or web address]  06/28/22 RH   Aerobic/Anaerobic Culture w Gram Stain (surgical/deep wound)     Status: None (Preliminary result)   Collection Time: 06/27/22  9:09 AM   Specimen: Wound; Tissue  Result Value Ref Range Status   Specimen Description   Final    BONE Performed at [facility], [address], [place], [place] [address]    Special Requests   Final    NONE Performed at [facility], [address] [place], [place] [address]    Gram Stain   Final    NO WBC SEEN NO ORGANISMS SEEN Performed at [facility], [address], [place], [place] [address]    Culture   Final    FEW METHICILLIN RESISTANT STAPHYLOCOCCUS AUREUS FEW ESCHERICHIA COLI NO ANAEROBES ISOLATED; CULTURE IN PROGRESS FOR 5 DAYS    Report Status PENDING  Incomplete   Organism ID, Bacteria METHICILLIN RESISTANT STAPHYLOCOCCUS AUREUS  Final   Organism ID, Bacteria ESCHERICHIA COLI  Final      Susceptibility   Escherichia coli - MIC*    AMPICILLIN <=2 SENSITIVE Sensitive     CEFEPIME <=0.12 SENSITIVE Sensitive     CEFTAZIDIME <=1 SENSITIVE Sensitive     CEFTRIAXONE <=0.25 SENSITIVE Sensitive     CIPROFLOXACIN <=0.25 SENSITIVE Sensitive     GENTAMICIN <=1 SENSITIVE Sensitive     IMIPENEM <=0.25 SENSITIVE Sensitive     TRIMETH/SULFA <=20 SENSITIVE Sensitive     AMPICILLIN/SULBACTAM <=2 SENSITIVE Sensitive     PIP/TAZO <=4 SENSITIVE Sensitive     * FEW ESCHERICHIA COLI   Methicillin resistant staphylococcus aureus - MIC*    CIPROFLOXACIN <=0.5 SENSITIVE Sensitive     ERYTHROMYCIN 0.5 SENSITIVE Sensitive     GENTAMICIN <=0.5 SENSITIVE Sensitive     OXACILLIN >=4 RESISTANT Resistant     TETRACYCLINE >=16 RESISTANT Resistant     VANCOMYCIN <=0.5 SENSITIVE Sensitive     TRIMETH/SULFA <=10 SENSITIVE  Sensitive     CLINDAMYCIN <=0.25 SENSITIVE Sensitive     RIFAMPIN <=0.5 SENSITIVE Sensitive     Inducible Clindamycin NEGATIVE Sensitive     LINEZOLID 2 SENSITIVE Sensitive     * FEW METHICILLIN RESISTANT STAPHYLOCOCCUS AUREUS  Culture, blood (Routine X 2) w Reflex to ID Panel     Status: None (Preliminary result)   Collection Time: 06/29/22  2:50 AM   Specimen: BLOOD  Result Value Ref Range Status   Specimen Description BLOOD RIGHT HAND  Final   Special Requests   Final    BOTTLES DRAWN AEROBIC AND ANAEROBIC Blood Culture adequate volume   Culture   Final    NO GROWTH 3 DAYS Performed at [facility], [address] [place], [place] [address]    Report Status PENDING  Incomplete  Culture, blood (Routine X 2) w Reflex to ID Panel     Status: None (Preliminary result)   Collection Time: 06/29/22  2:50 AM   Specimen: BLOOD  Result Value Ref Range Status   Specimen Description BLOOD RIGHT ARM  Final   Special Requests   Final    BOTTLES DRAWN AEROBIC AND ANAEROBIC Blood Culture adequate volume   Culture   Final    NO GROWTH 3 DAYS Performed at [facility], [address] [place], [place] [address]    Report Status PENDING  Incomplete         Radiology Studies: ECHO TEE  Result Date: 07/01/2022  TRANSESOPHOGEAL ECHO REPORT   Patient Name:   Martin Mullen Date of Exam: 07/01/2022 Medical Rec #:  [id number]       Height:       70.0 in Accession #:    [id number]      Weight:       203.9 lb Date of Birth:  1980-11-25       BSA:          2.105 m Patient Age:    42 years        BP:           107/80 mmHg Patient Gender: M               HR:           86 bpm. Exam Location:  [facility] Procedure: Transesophageal Echo, Cardiac Doppler, Color Doppler and Saline            Contrast Bubble Study Indications:     Bacteremia R78.81  History:         Patient has prior history of Echocardiogram examinations, most                  recent 06/28/2022. Stroke; Risk Factors:Diabetes.  Tobacco use.  Sonographer:     Cristela Blue Referring Phys:  [id number] Caryn Bee P PATEL Diagnosing Phys: Julien Nordmann MD PROCEDURE: After discussion of the risks and benefits of a TEE, an informed consent was obtained. TEE procedure time was 30 minutes. The transesophogeal probe was passed without difficulty through the esophogus of the patient. Imaged were obtained with the patient in a left lateral decubitus position. Local oropharyngeal anesthetic was provided with Cetacaine and viscous lidocaine. Sedation performed by different physician. Image quality was good. The patient's vital signs; including heart rate, blood pressure, and oxygen saturation; remained stable throughout the procedure. The patient developed no complications during the procedure.  IMPRESSIONS  1. Left ventricular ejection fraction, by estimation, is 60 to 65%. The left ventricle has normal function. The left ventricle has no regional wall motion abnormalities.  2. Right ventricular systolic function is normal. The right ventricular size is normal.  3. No left atrial/left atrial appendage thrombus was detected.  4. The mitral valve is normal in structure. Mild mitral valve regurgitation. No evidence of mitral stenosis.  5. The aortic valve is normal in structure. Aortic valve regurgitation is not visualized. Aortic valve sclerosis is present, calcification noted on NCC, unable to exclude mild prolapse. No evidence of aortic valve stenosis.  6. The inferior vena cava is normal in size with greater than 50% respiratory variability, suggesting right atrial pressure of 3 mmHg.  7. Agitated saline contrast bubble study was negative, with no evidence of any interatrial shunt. Conclusion(s)/Recommendation(s): Normal biventricular function without evidence of hemodynamically significant valvular heart disease. FINDINGS  Left Ventricle: Left ventricular ejection fraction, by estimation, is 60 to 65%. The left ventricle has normal function. The left  ventricle has no regional wall motion abnormalities. The left ventricular internal cavity size was normal in size. There is  no left ventricular hypertrophy. Right Ventricle: The right ventricular size is normal. No increase in right ventricular wall thickness. Right ventricular systolic function is normal. Left Atrium: Left atrial size was normal in size. No left atrial/left atrial appendage thrombus was detected. Right Atrium: Right atrial size was normal in size. Pericardium: There is no evidence of pericardial effusion. Mitral Valve: The mitral valve is normal in structure. Mild mitral valve regurgitation. No evidence of mitral  valve stenosis. There is no evidence of mitral valve vegetation. Tricuspid Valve: The tricuspid valve is normal in structure. Tricuspid valve regurgitation is trivial. No evidence of tricuspid stenosis. There is no evidence of tricuspid valve vegetation. Aortic Valve: The aortic valve is normal in structure. Aortic valve regurgitation is not visualized. Aortic valve sclerosis is present, with no evidence of aortic valve stenosis. There is no evidence of aortic valve vegetation. Pulmonic Valve: The pulmonic valve was normal in structure. Pulmonic valve regurgitation is not visualized. No evidence of pulmonic stenosis. There is no evidence of pulmonic valve vegetation. Aorta: The aortic root is normal in size and structure. Venous: The inferior vena cava is normal in size with greater than 50% respiratory variability, suggesting right atrial pressure of 3 mmHg. IAS/Shunts: No atrial level shunt detected by color flow Doppler. Agitated saline contrast was given intravenously to evaluate for intracardiac shunting. Agitated saline contrast bubble study was negative, with no evidence of any interatrial shunt. There  is no evidence of a patent foramen ovale. There is no evidence of an atrial septal defect. Julien Nordmann MD Electronically signed by Julien Nordmann MD Signature Date/Time:  07/01/2022/5:52:32 PM    Final (Updated)         Scheduled Meds:  aspirin EC  81 mg Oral Daily   buPROPion  150 mg Oral Daily   butamben-tetracaine-benzocaine  1 spray Topical Once   insulin aspart  0-15 Units Subcutaneous TID WC   insulin aspart  0-5 Units Subcutaneous QHS   insulin glargine-yfgn  16 Units Subcutaneous Daily   lactulose  20 g Oral BID   lidocaine  15 mL Mouth/Throat Once   losartan  25 mg Oral Daily   nicotine  21 mg Transdermal Daily   polyethylene glycol  17 g Oral Daily   rosuvastatin  20 mg Oral Daily   Continuous Infusions:  ampicillin-sulbactam (UNASYN) IV 3 g (07/02/22 0331)   heparin 2,600 Units/hr (07/02/22 0039)   vancomycin 1,500 mg (07/01/22 2245)     LOS: 6 days    Time spent: 25 mins     Charise Killian, MD [facility] Pager [phone]  If 7PM-7AM, please contact night-coverage [email or web address] 07/02/2022, 8:04 AM

## 2022-07-02 NOTE — Consult Note (Signed)
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   No Known Allergies  Patient Measurements: Height: 5\' 10"  (177.8 cm) Weight: 92.5 kg (203 lb 14.8 oz) IBW/kg (Calculated) : 73 Heparin Dosing Weight: 91.6 kg  Vital Signs: Temp: 98 F (36.7 C) (05/18 0439) Temp Source: Oral (05/18 0439) BP: 116/63 (05/18 0439) Pulse Rate: 94 (05/18 0439)  Labs: Recent Labs    06/30/22 0203 06/30/22 1006 07/01/22 0512 07/01/22 1318 07/02/22 0446  HGB 13.4  --  13.3  --  12.3*  HCT 39.2  --  37.8*  --  36.4*  PLT 283  --  337  --  322  APTT 52*   < > 93* 95* 105*  HEPARINUNFRC 0.26*  --  0.60 0.71* 0.62  CREATININE 0.82  --  0.78  --  0.78   < > = values in this interval not displayed.     Estimated Creatinine Clearance: 137.5 mL/min (by C-G formula based on SCr of 0.78 mg/dL).   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   Chicken pox    Coronary artery disease    Inferior ST elevation myocardial infarction in July of 2014. Cardiac catheterization showed 95% distal RCA stenosis and 90% first diagonal stenosis with normal ejection fraction. He underwent PCI in bare-metal stent placement to the distal RCA.   Diabetes mellitus without complication (HCC)    Hypercholesterolemia    Stroke (HCC)    Tobacco use     Pertinent Medications:  Scheduled:   aspirin EC  81 mg Oral Daily   buPROPion  150 mg Oral Daily   butamben-tetracaine-benzocaine  1 spray Topical Once   insulin aspart  0-15 Units Subcutaneous TID WC   insulin aspart  0-5 Units Subcutaneous QHS   insulin glargine-yfgn  16 Units Subcutaneous Daily   lactulose  20 g Oral BID   lidocaine  15 mL Mouth/Throat Once   losartan  25 mg Oral Daily   nicotine  21 mg Transdermal Daily   polyethylene glycol  17 g Oral Daily   rosuvastatin  20 mg Oral  Daily   Infusions:   ampicillin-sulbactam (UNASYN) IV 3 g (07/02/22 1227)   heparin 2,600 Units/hr (07/02/22 0039)   vancomycin 1,500 mg (07/02/22 0945)   PRN: acetaminophen **OR** acetaminophen, fentaNYL (SUBLIMAZE) injection, fentaNYL (SUBLIMAZE) injection, guaiFENesin-dextromethorphan, HYDROcodone-acetaminophen, HYDROmorphone (DILAUDID) injection, HYDROmorphone (DILAUDID) injection, nicotine polacrilex, ondansetron **OR** ondansetron (ZOFRAN) IV, ondansetron (ZOFRAN) IV, ondansetron (ZOFRAN) IV, senna-docusate  Assessment: Martin Mullen is a 42 y.o. male with PMH significant for STEMI (2014), HTN, recurrent strokes, heterozygous prothrombin gene mutation on Eliquis, T2DM, history of Fournier's gangrene. He presented to the hospital for pain in the right leg. Patient was on apixaban 5 mg BID PTA. Last dose 5/13 @ 2115. Angiogram done 5/15. Pharmacy has been consulted to switch from heparin infusion to home apixaban.    Plan:   Discontinue heparin infusion  Start apixaban 5 mg BID Pharmacy will sign off and continue to monitor peripherally     Celene Squibb, PharmD PGY1 Pharmacy Resident 07/02/2022 1:22 PM

## 2022-07-03 ENCOUNTER — Encounter: Payer: Self-pay | Admitting: Cardiovascular Disease

## 2022-07-03 DIAGNOSIS — R7881 Bacteremia: Secondary | ICD-10-CM | POA: Diagnosis not present

## 2022-07-03 DIAGNOSIS — I739 Peripheral vascular disease, unspecified: Secondary | ICD-10-CM | POA: Diagnosis not present

## 2022-07-03 DIAGNOSIS — M8608 Acute hematogenous osteomyelitis, other sites: Secondary | ICD-10-CM | POA: Diagnosis not present

## 2022-07-03 DIAGNOSIS — B9562 Methicillin resistant Staphylococcus aureus infection as the cause of diseases classified elsewhere: Secondary | ICD-10-CM | POA: Diagnosis not present

## 2022-07-03 LAB — CBC
HCT: 36.3 % — ABNORMAL LOW (ref 39.0–52.0)
Hemoglobin: 12.4 g/dL — ABNORMAL LOW (ref 13.0–17.0)
MCH: 30.5 pg (ref 26.0–34.0)
MCHC: 34.2 g/dL (ref 30.0–36.0)
MCV: 89.4 fL (ref 80.0–100.0)
Platelets: 362 10*3/uL (ref 150–400)
RBC: 4.06 MIL/uL — ABNORMAL LOW (ref 4.22–5.81)
RDW: 12.6 % (ref 11.5–15.5)
WBC: 11.7 10*3/uL — ABNORMAL HIGH (ref 4.0–10.5)
nRBC: 0 % (ref 0.0–0.2)

## 2022-07-03 LAB — BASIC METABOLIC PANEL
Anion gap: 6 (ref 5–15)
BUN: 10 mg/dL (ref 6–20)
CO2: 26 mmol/L (ref 22–32)
Calcium: 8.4 mg/dL — ABNORMAL LOW (ref 8.9–10.3)
Chloride: 103 mmol/L (ref 98–111)
Creatinine, Ser: 0.75 mg/dL (ref 0.61–1.24)
GFR, Estimated: >60 mL/min (ref >60)
Glucose, Bld: 164 mg/dL — ABNORMAL HIGH (ref 70–99)
Potassium: 3.9 mmol/L (ref 3.5–5.1)
Sodium: 135 mmol/L (ref 135–145)

## 2022-07-03 LAB — GLUCOSE, CAPILLARY
Glucose-Capillary: 162 mg/dL — ABNORMAL HIGH (ref 70–99)
Glucose-Capillary: 232 mg/dL — ABNORMAL HIGH (ref 70–99)
Glucose-Capillary: 472 mg/dL — ABNORMAL HIGH (ref 70–99)
Glucose-Capillary: 143 mg/dL — ABNORMAL HIGH (ref 70–99)
Glucose-Capillary: 99 mg/dL (ref 70–99)

## 2022-07-03 LAB — CULTURE, BLOOD (ROUTINE X 2): Special Requests: ADEQUATE

## 2022-07-03 NOTE — Progress Notes (Signed)
   PODIATRY PROGRESS NOTE Patient Name: Martin Mullen  DOB 1980/09/17 DOA 06/26/2022  Hospital Day: 8  Assessment:  42 y.o. 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  AF, VSS  WBC: 11.7 (from 13.9 yesterday)  Wound/Bone Cultures: MRSA, E coli  Plan:  - Dressing changed today, increased maceration at amp site, will convert to betadine dressing change every other day to promote desiccation of the amp site - Abx per ID, appreciate recs - Dressing to remain until Tuesday then change - Limited weightbearing to right foot in CAM boot, ordered - Pt will follow up 1 week from discharge        Corinna Gab, DPM [facility]    Subjective:  Patient seen this AM denies pain. Doing well.   Objective:   Vitals:   07/02/22 1934 07/03/22 0347  BP: 119/81 132/85  Pulse: 95 83  Resp: 20 18  Temp: 98.6 F (37 C) 99.1 F (37.3 C)  SpO2: 97% 96%       Latest Ref Rng & Units 07/03/2022    4:35 AM 07/02/2022    4:46 AM 07/01/2022    5:12 AM  CBC  WBC 4.0 - 10.5 K/uL 11.7  13.9  20.3   Hemoglobin 13.0 - 17.0 g/dL 40.9  81.1  91.4   Hematocrit 39.0 - 52.0 % 36.3  36.4  37.8   Platelets 150 - 400 K/uL 362  322  337        Latest Ref Rng & Units 07/03/2022    4:35 AM 07/02/2022    4:46 AM 07/01/2022    5:12 AM  BMP  Glucose 70 - 99 mg/dL 782  956  213   BUN 6 - 20 mg/dL 10  13  14    Creatinine 0.61 - 1.24 mg/dL 0.86  5.78  4.69   Sodium 135 - 145 mmol/L 135  136  134   Potassium 3.5 - 5.1 mmol/L 3.9  3.9  4.4   Chloride 98 - 111 mmol/L 103  104  102   CO2 22 - 32 mmol/L 26  25  23    Calcium 8.9 - 10.3 mg/dL 8.4  8.3  8.6     General: AAOx3, NAD  Lower Extremity Exam 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            Radiology:  Results reviewed. See assessment for pertinent imaging results

## 2022-07-03 NOTE — Progress Notes (Signed)
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)   Recurrent strokes (HCC)   History of Fournier's gangrene   Chronic anticoagulation   Plantar ulcer of right foot, with fat layer exposed (HCC)   PAD (peripheral artery disease) (HCC)   Hypokalemia   MRSA bacteremia   Diabetic infection of right foot (HCC)   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 S/p washout I&D of right foot infection on 06/27/2022. S/p debridement, graft, & VAC application 06/30/22 as per podiatry.  Continue on IV unasyn, vanco as per ID. Podiatry & ID following and recs apprec. See Dr. Helayne Seminole notes on how pt met sepsis criteria. Sepsis resolved   MRSA bacteremia: continue on IV unasyn, vanco as per ID. TEE was neg for endocarditis    Hypokalemia: WNL today   Hyponatremia: resolved    Hx of CAD: w/ bare-metal stent to distal RCA in 2014. Continue on statin, aspirin   PVD: s/p RLE angiogram in which angioplasty of right popliteal artery, right tibioperoneal trunk, posterior tibial artery was done & stent placed into the right popliteal artery on 06/29/22. Continue on statin, aspirin     Heterozygous for prothrombin gene 20210A mutation: w/ recurrent strokes. Continue on eliquis. IV heparin was d/c    DMII: poorly controlled, HBA1c 8.2. Continue on glargine, SSI w/ accuchecks   HbA1c 8.2, poorly controlled. Continue on glargine, SSI w/ accuchecks    Overweight: BMI 29.2. Would benefit from weight loss     DVT prophylaxis: IV heparin  Code Status: full  Family Communication:  Disposition Plan: likely d/c back home  Level of care: Telemetry Medical  Status is: Inpatient Remains inpatient appropriate because: severity of illness, still requiring IV abxs as per ID     Consultants:  Vasc surg ID  Cardio   Procedures:   Antimicrobials: unasyn, vanco   Subjective: Pt c/o fatigue   Objective: Vitals:   07/01/22 1948 07/02/22 0439 07/02/22 1934 07/03/22 0347  BP: 123/80 116/63 119/81 132/85  Pulse: 97 94 95 83  Resp: 16 16 20 18   Temp: 97.7 F (36.5 C) 98 F (36.7 C) 98.6 F (37 C) 99.1 F (37.3 C)  TempSrc: Oral Oral Oral Oral  SpO2: 92% 95% 97% 96%  Weight:      Height:        Intake/Output Summary (Last 24 hours) at 07/03/2022 0744 Last data filed at 07/03/2022 0727 Gross per 24 hour  Intake 897.4 ml  Output 800 ml  Net 97.4 ml   Filed Weights   06/26/22 1838 06/30/22 1116  Weight: 92.5 kg 92.5 kg    Examination:  General exam: Appears calm & comfortable  Respiratory system: clear breath sounds b/l  Cardiovascular system: S1 & S2+. No rubs or clicks  Gastrointestinal system: abd is soft, NT, ND & normal bowel sounds  Central nervous system: Awake. Moves all extremities Psychiatry: judgement and insight appears at baseline. Flat mood and affect     Data Reviewed: I have personally reviewed following labs and imaging studies  CBC: Recent Labs  Lab 06/26/22 1956 06/27/22 0511 06/28/22 0559 06/29/22 0250 06/30/22 0203 07/01/22 0512 07/02/22 0446 07/03/22 0435  WBC 23.7*   < > 17.1* 18.0* 15.0* 20.3* 13.9* 11.7*   NEUTROABS 20.2*  --  12.9*  --   --   --   --   --   HGB 15.6   < > 14.9 13.9 13.4 13.3 12.3* 12.4*  HCT 45.5   < > 44.3 39.5 39.2 37.8* 36.4* 36.3*  MCV 89.9   < > 90.8 87.8 88.3 88.7 89.9 89.4  PLT 321   < > 277 286 283 337 322 362   < > = values in this interval not displayed.   Basic Metabolic Panel: Recent Labs  Lab 06/28/22 0559 06/29/22 1240 06/30/22 0203 07/01/22 0512 07/02/22 0446 07/03/22 0435  NA 135 133* 131* 134* 136 135  K 3.6 4.0 4.1 4.4 3.9 3.9  CL 101 100 98 102 104 103  CO2 24 23 23 23 25 26   GLUCOSE 153* 127* 198* 277* 268* 164*  BUN 8 9 9 14 13 10   CREATININE 0.87 0.83 0.82 0.78 0.78 0.75  CALCIUM 8.4* 8.2* 8.4* 8.6* 8.3* 8.4*  MG 1.8  --   --   --   --   --    GFR: Estimated Creatinine Clearance: 137.5 mL/min (by C-G formula based on SCr of 0.75 mg/dL). Liver Function Tests: Recent Labs  Lab 06/26/22 1956  AST 13*  ALT 8  ALKPHOS 125  BILITOT 1.0  PROT 7.8  ALBUMIN 3.4*   No results for input(s): "LIPASE", "AMYLASE" in the last 168 hours. No results for input(s): "AMMONIA" in the last 168 hours. Coagulation Profile: Recent Labs  Lab 06/27/22 0511 06/28/22 1116  INR 1.3* 1.2   Cardiac Enzymes: No results for input(s): "CKTOTAL", "CKMB", "CKMBINDEX", "TROPONINI" in the last 168 hours. BNP (last 3 results) No results for input(s): "PROBNP" in the last 8760 hours. HbA1C: No results for input(s): "HGBA1C" in the last 72 hours. CBG: Recent Labs  Lab 07/01/22 1720 07/01/22 2114 07/02/22 0748 07/02/22 1213 07/02/22 2126  GLUCAP 226* 311* 236* 110* 292*   Lipid Profile: No results for input(s): "CHOL", "HDL", "LDLCALC", "TRIG", "CHOLHDL", "LDLDIRECT" in the last 72 hours. Thyroid Function Tests: No results for input(s): "TSH", "T4TOTAL", "FREET4", "T3FREE", "THYROIDAB" in the last 72 hours. Anemia Panel: No results for input(s): "VITAMINB12", "FOLATE", "FERRITIN", "TIBC", "IRON", "RETICCTPCT" in the last 72 hours. Sepsis  Labs: Recent Labs  Lab 06/26/22 1956 06/26/22 2319 06/27/22 0511  PROCALCITON  --   --  0.46  LATICACIDVEN 2.8* 1.4  --     Recent Results (from the past 240 hour(s))  Blood culture (single)     Status: Abnormal (Preliminary result)   Collection Time: 06/26/22  7:56 PM   Specimen: BLOOD  Result Value Ref Range Status   Specimen Description   Final    BLOOD RIGHT ANTECUBITAL Performed at [facility], [address] [place], [place] [address]    Special Requests   Final    BOTTLES DRAWN AEROBIC AND ANAEROBIC Blood Culture adequate volume Performed at  [facility], [address], [place], [place] [address]    Culture  Setup Time   Final    GRAM POSITIVE COCCI AEROBIC BOTTLE ONLY CRITICAL RESULT CALLED TO, READ BACK BY AND VERIFIED WITH: DEVAN MITCHELL 06/27/2022 1124 CP    Culture (A)  Final    METHICILLIN RESISTANT STAPHYLOCOCCUS AUREUS Sent to [facility] for further susceptibility testing. Performed at [facility], [address], [place], [place] [address]    Report Status PENDING  Incomplete   Organism ID, Bacteria METHICILLIN RESISTANT STAPHYLOCOCCUS AUREUS  Final      Susceptibility   Methicillin resistant staphylococcus aureus - MIC*    CIPROFLOXACIN <=0.5 SENSITIVE Sensitive     ERYTHROMYCIN 0.5 SENSITIVE Sensitive     GENTAMICIN <=0.5 SENSITIVE Sensitive     OXACILLIN >=4 RESISTANT Resistant     TETRACYCLINE >=16 RESISTANT Resistant     VANCOMYCIN 1 SENSITIVE Sensitive     TRIMETH/SULFA <=10 SENSITIVE Sensitive     CLINDAMYCIN <=0.25 SENSITIVE Sensitive     RIFAMPIN <=0.5 SENSITIVE Sensitive     Inducible Clindamycin NEGATIVE Sensitive     LINEZOLID 2 SENSITIVE Sensitive     * METHICILLIN RESISTANT STAPHYLOCOCCUS AUREUS  Blood Culture ID Panel (Reflexed)     Status: Abnormal   Collection Time: 06/26/22  7:56 PM  Result Value Ref Range Status   Enterococcus faecalis NOT DETECTED NOT DETECTED Final   Enterococcus Faecium NOT DETECTED  NOT DETECTED Final   Listeria monocytogenes NOT DETECTED NOT DETECTED Final   Staphylococcus species DETECTED (A) NOT DETECTED Final    Comment: CRITICAL RESULT CALLED TO, READ BACK BY AND VERIFIED WITH: DEVAN MITCHELL 06/27/2022 1124 CP    Staphylococcus aureus (BCID) DETECTED (A) NOT DETECTED Final    Comment: Methicillin (oxacillin)-resistant Staphylococcus aureus (MRSA). MRSA is predictably resistant to beta-lactam antibiotics (except ceftaroline). Preferred therapy is vancomycin unless clinically contraindicated. Patient requires contact precautions if  hospitalized. CRITICAL RESULT CALLED TO, READ BACK BY AND VERIFIED WITH: DEVAN MITCHELL 06/27/2022 1124 CP    Staphylococcus epidermidis NOT DETECTED NOT DETECTED Final   Staphylococcus lugdunensis NOT DETECTED NOT DETECTED Final   Streptococcus species NOT DETECTED NOT DETECTED Final   Streptococcus agalactiae NOT DETECTED NOT DETECTED Final   Streptococcus pneumoniae NOT DETECTED NOT DETECTED Final   Streptococcus pyogenes NOT DETECTED NOT DETECTED Final   A.calcoaceticus-baumannii NOT DETECTED NOT DETECTED Final   Bacteroides fragilis NOT DETECTED NOT DETECTED Final   Enterobacterales NOT DETECTED NOT DETECTED Final   Enterobacter cloacae complex NOT DETECTED NOT DETECTED Final   Escherichia coli NOT DETECTED NOT DETECTED Final   Klebsiella aerogenes NOT DETECTED NOT DETECTED Final   Klebsiella oxytoca NOT DETECTED NOT DETECTED Final   Klebsiella pneumoniae NOT DETECTED NOT DETECTED Final   Proteus species NOT DETECTED NOT DETECTED Final   Salmonella species NOT DETECTED NOT DETECTED Final   Serratia marcescens NOT DETECTED NOT DETECTED Final   Haemophilus influenzae NOT DETECTED NOT DETECTED Final   Neisseria meningitidis NOT DETECTED NOT DETECTED Final   Pseudomonas aeruginosa NOT DETECTED NOT DETECTED Final   Stenotrophomonas maltophilia NOT DETECTED NOT DETECTED Final   Candida albicans NOT DETECTED NOT DETECTED Final    Candida auris NOT DETECTED NOT DETECTED Final   Candida glabrata NOT DETECTED NOT DETECTED Final   Candida krusei NOT DETECTED NOT DETECTED Final   Candida parapsilosis NOT DETECTED NOT DETECTED Final   Candida tropicalis NOT DETECTED NOT DETECTED Final   Cryptococcus neoformans/gattii NOT DETECTED NOT DETECTED Final  Meth resistant mecA/C and MREJ DETECTED (A) NOT DETECTED Final    Comment: CRITICAL RESULT CALLED TO, READ BACK BY AND VERIFIED WITH: [facility] MITCHELL 06/27/2022 1124 CP Performed at [facility], [address] [place], [place] [address]   Blood culture (single)     Status: Abnormal   Collection Time: 06/26/22  8:04 PM   Specimen: BLOOD  Result Value Ref Range Status   Specimen Description   Final    BLOOD LEFT ANTECUBITAL Performed at [facility], [address] [place], [place] [address]    Special Requests   Final    BOTTLES DRAWN AEROBIC AND ANAEROBIC Blood Culture adequate volume Performed at [facility], [address], [place], [place] [address]    Culture  Setup Time   Final    GRAM POSITIVE COCCI AEROBIC BOTTLE ONLY Organism ID to follow CRITICAL RESULT CALLED TO, READ BACK BY AND VERIFIED WITH: [email or web address]  06/28/22 RH Performed at [facility], [address] [place], [place] [address]    Culture (A)  Final    STAPHYLOCOCCUS AUREUS SUSCEPTIBILITIES PERFORMED ON PREVIOUS CULTURE WITHIN THE LAST 5 DAYS. Performed at [facility], [address], [place], [place] [address]    Report Status 06/29/2022 FINAL  Final  Blood Culture ID Panel (Reflexed)     Status: Abnormal   Collection Time: 06/26/22  8:04 PM  Result Value Ref Range Status   Enterococcus faecalis NOT DETECTED NOT DETECTED Final   Enterococcus Faecium NOT DETECTED NOT DETECTED Final   Listeria monocytogenes NOT DETECTED NOT DETECTED Final   Staphylococcus species DETECTED (A) NOT DETECTED Final    Comment: CRITICAL RESULT CALLED TO, READ  BACK BY AND VERIFIED WITH: NATHAN [email or web address]  06/28/22 RH    Staphylococcus aureus (BCID) DETECTED (A) NOT DETECTED Final    Comment: Methicillin (oxacillin)-resistant Staphylococcus aureus (MRSA). MRSA is predictably resistant to beta-lactam antibiotics (except ceftaroline). Preferred therapy is vancomycin unless clinically contraindicated. Patient requires contact precautions if  hospitalized. CRITICAL RESULT CALLED TO, READ BACK BY AND VERIFIED WITH: [email or web address]  06/28/22 RH    Staphylococcus epidermidis NOT DETECTED NOT DETECTED Final   Staphylococcus lugdunensis NOT DETECTED NOT DETECTED Final   Streptococcus species NOT DETECTED NOT DETECTED Final   Streptococcus agalactiae NOT DETECTED NOT DETECTED Final   Streptococcus pneumoniae NOT DETECTED NOT DETECTED Final   Streptococcus pyogenes NOT DETECTED NOT DETECTED Final   A.calcoaceticus-baumannii NOT DETECTED NOT DETECTED Final   Bacteroides fragilis NOT DETECTED NOT DETECTED Final   Enterobacterales NOT DETECTED NOT DETECTED Final   Enterobacter cloacae complex NOT DETECTED NOT DETECTED Final   Escherichia coli NOT DETECTED NOT DETECTED Final   Klebsiella aerogenes NOT DETECTED NOT DETECTED Final   Klebsiella oxytoca NOT DETECTED NOT DETECTED Final   Klebsiella pneumoniae NOT DETECTED NOT DETECTED Final   Proteus species NOT DETECTED NOT DETECTED Final   Salmonella species NOT DETECTED NOT DETECTED Final   Serratia marcescens NOT DETECTED NOT DETECTED Final   Haemophilus influenzae NOT DETECTED NOT DETECTED Final   Neisseria meningitidis NOT DETECTED NOT DETECTED Final   Pseudomonas aeruginosa NOT DETECTED NOT DETECTED Final   Stenotrophomonas maltophilia NOT DETECTED NOT DETECTED Final   Candida albicans NOT DETECTED NOT DETECTED Final   Candida auris NOT DETECTED NOT DETECTED Final   Candida glabrata NOT DETECTED NOT DETECTED Final   Candida krusei NOT DETECTED NOT DETECTED Final   Candida parapsilosis NOT DETECTED NOT  DETECTED Final   Candida tropicalis NOT DETECTED NOT  DETECTED Final   Cryptococcus neoformans/gattii NOT DETECTED NOT DETECTED Final   Meth resistant mecA/C and MREJ DETECTED (A) NOT DETECTED Corrected    Comment: CRITICAL RESULT CALLED TO, READ BACK BY AND VERIFIED WITH: NATHAN [email or web address]  06/28/22 RH Performed at [facility], [address] [place], [place] [address] CORRECTED ON 05/14 AT 0802: PREVIOUSLY REPORTED AS DETECTED NATHAN [email or web address]@[email or web address]  06/28/22 RH   Aerobic/Anaerobic Culture w Gram Stain (surgical/deep wound)     Status: None   Collection Time: 06/27/22  9:09 AM   Specimen: Wound; Tissue  Result Value Ref Range Status   Specimen Description   Final    BONE Performed at [facility], [address] [place], [place] [address]    Special Requests   Final    NONE Performed at [facility], [address] [place], [place] [address]    Gram Stain NO WBC SEEN NO ORGANISMS SEEN   Final   Culture   Final    FEW METHICILLIN RESISTANT STAPHYLOCOCCUS AUREUS FEW ESCHERICHIA COLI RARE VIRIDANS STREPTOCOCCUS RARE CORYNEBACTERIUM SPECIES Standardized susceptibility testing for this organism is not available. NO ANAEROBES ISOLATED Performed at [facility], [address], [place], [place] [address]    Report Status 07/02/2022 FINAL  Final   Organism ID, Bacteria METHICILLIN RESISTANT STAPHYLOCOCCUS AUREUS  Final   Organism ID, Bacteria ESCHERICHIA COLI  Final      Susceptibility   Escherichia coli - MIC*    AMPICILLIN <=2 SENSITIVE Sensitive     CEFEPIME <=0.12 SENSITIVE Sensitive     CEFTAZIDIME <=1 SENSITIVE Sensitive     CEFTRIAXONE <=0.25 SENSITIVE Sensitive     CIPROFLOXACIN <=0.25 SENSITIVE Sensitive     GENTAMICIN <=1 SENSITIVE Sensitive     IMIPENEM <=0.25 SENSITIVE Sensitive     TRIMETH/SULFA <=20 SENSITIVE Sensitive     AMPICILLIN/SULBACTAM <=2 SENSITIVE Sensitive     PIP/TAZO <=4 SENSITIVE Sensitive     * FEW ESCHERICHIA COLI    Methicillin resistant staphylococcus aureus - MIC*    CIPROFLOXACIN <=0.5 SENSITIVE Sensitive     ERYTHROMYCIN 0.5 SENSITIVE Sensitive     GENTAMICIN <=0.5 SENSITIVE Sensitive     OXACILLIN >=4 RESISTANT Resistant     TETRACYCLINE >=16 RESISTANT Resistant     VANCOMYCIN <=0.5 SENSITIVE Sensitive     TRIMETH/SULFA <=10 SENSITIVE Sensitive     CLINDAMYCIN <=0.25 SENSITIVE Sensitive     RIFAMPIN <=0.5 SENSITIVE Sensitive     Inducible Clindamycin NEGATIVE Sensitive     LINEZOLID 2 SENSITIVE Sensitive     * FEW METHICILLIN RESISTANT STAPHYLOCOCCUS AUREUS  Culture, blood (Routine X 2) w Reflex to ID Panel     Status: None (Preliminary result)   Collection Time: 06/29/22  2:50 AM   Specimen: BLOOD  Result Value Ref Range Status   Specimen Description BLOOD RIGHT HAND  Final   Special Requests   Final    BOTTLES DRAWN AEROBIC AND ANAEROBIC Blood Culture adequate volume   Culture   Final    NO GROWTH 4 DAYS Performed at [facility], [address] [place], [place] [address]    Report Status PENDING  Incomplete  Culture, blood (Routine X 2) w Reflex to ID Panel     Status: None (Preliminary result)   Collection Time: 06/29/22  2:50 AM   Specimen: BLOOD  Result Value Ref Range Status   Specimen Description BLOOD RIGHT ARM  Final   Special Requests   Final    BOTTLES DRAWN  AEROBIC AND ANAEROBIC Blood Culture adequate volume   Culture   Final    NO GROWTH 4 DAYS Performed at [facility], [address] [place], [place] [address]    Report Status PENDING  Incomplete         Radiology Studies: ECHO TEE  Result Date: 07/01/2022    TRANSESOPHOGEAL ECHO REPORT   Patient Name:   Martin Mullen Date of Exam: 07/01/2022 Medical Rec #:  [id number]       Height:       70.0 in Accession #:    [id number]      Weight:       203.9 lb Date of Birth:  11-24-1980       BSA:          2.105 m Patient Age:    42 years        BP:           107/80 mmHg Patient Gender: M                HR:           86 bpm. Exam Location:  [facility] Procedure: Transesophageal Echo, Cardiac Doppler, Color Doppler and Saline            Contrast Bubble Study Indications:     Bacteremia R78.81  History:         Patient has prior history of Echocardiogram examinations, most                  recent 06/28/2022. Stroke; Risk Factors:Diabetes. Tobacco use.  Sonographer:     Cristela Blue Referring Phys:  [id number] Caryn Bee P PATEL Diagnosing Phys: Julien Nordmann MD PROCEDURE: After discussion of the risks and benefits of a TEE, an informed consent was obtained. TEE procedure time was 30 minutes. The transesophogeal probe was passed without difficulty through the esophogus of the patient. Imaged were obtained with the patient in a left lateral decubitus position. Local oropharyngeal anesthetic was provided with Cetacaine and viscous lidocaine. Sedation performed by different physician. Image quality was good. The patient's vital signs; including heart rate, blood pressure, and oxygen saturation; remained stable throughout the procedure. The patient developed no complications during the procedure.  IMPRESSIONS  1. Left ventricular ejection fraction, by estimation, is 60 to 65%. The left ventricle has normal function. The left ventricle has no regional wall motion abnormalities.  2. Right ventricular systolic function is normal. The right ventricular size is normal.  3. No left atrial/left atrial appendage thrombus was detected.  4. The mitral valve is normal in structure. Mild mitral valve regurgitation. No evidence of mitral stenosis.  5. The aortic valve is normal in structure. Aortic valve regurgitation is not visualized. Aortic valve sclerosis is present, calcification noted on NCC, unable to exclude mild prolapse. No evidence of aortic valve stenosis.  6. The inferior vena cava is normal in size with greater than 50% respiratory variability, suggesting right atrial pressure of 3 mmHg.  7. Agitated saline contrast bubble study was  negative, with no evidence of any interatrial shunt. Conclusion(s)/Recommendation(s): Normal biventricular function without evidence of hemodynamically significant valvular heart disease. FINDINGS  Left Ventricle: Left ventricular ejection fraction, by estimation, is 60 to 65%. The left ventricle has normal function. The left ventricle has no regional wall motion abnormalities. The left ventricular internal cavity size was normal in size. There is  no left ventricular hypertrophy. Right Ventricle: The right ventricular size is normal. No increase in right ventricular  wall thickness. Right ventricular systolic function is normal. Left Atrium: Left atrial size was normal in size. No left atrial/left atrial appendage thrombus was detected. Right Atrium: Right atrial size was normal in size. Pericardium: There is no evidence of pericardial effusion. Mitral Valve: The mitral valve is normal in structure. Mild mitral valve regurgitation. No evidence of mitral valve stenosis. There is no evidence of mitral valve vegetation. Tricuspid Valve: The tricuspid valve is normal in structure. Tricuspid valve regurgitation is trivial. No evidence of tricuspid stenosis. There is no evidence of tricuspid valve vegetation. Aortic Valve: The aortic valve is normal in structure. Aortic valve regurgitation is not visualized. Aortic valve sclerosis is present, with no evidence of aortic valve stenosis. There is no evidence of aortic valve vegetation. Pulmonic Valve: The pulmonic valve was normal in structure. Pulmonic valve regurgitation is not visualized. No evidence of pulmonic stenosis. There is no evidence of pulmonic valve vegetation. Aorta: The aortic root is normal in size and structure. Venous: The inferior vena cava is normal in size with greater than 50% respiratory variability, suggesting right atrial pressure of 3 mmHg. IAS/Shunts: No atrial level shunt detected by color flow Doppler. Agitated saline contrast was given  intravenously to evaluate for intracardiac shunting. Agitated saline contrast bubble study was negative, with no evidence of any interatrial shunt. There  is no evidence of a patent foramen ovale. There is no evidence of an atrial septal defect. Julien Nordmann MD Electronically signed by Julien Nordmann MD Signature Date/Time: 07/01/2022/5:52:32 PM    Final (Updated)         Scheduled Meds:  apixaban  5 mg Oral BID   aspirin EC  81 mg Oral Daily   buPROPion  150 mg Oral Daily   butamben-tetracaine-benzocaine  1 spray Topical Once   insulin aspart  0-15 Units Subcutaneous TID WC   insulin aspart  0-5 Units Subcutaneous QHS   insulin glargine-yfgn  16 Units Subcutaneous Daily   lactulose  20 g Oral BID   lidocaine  15 mL Mouth/Throat Once   losartan  25 mg Oral Daily   nicotine  21 mg Transdermal Daily   polyethylene glycol  17 g Oral Daily   rosuvastatin  20 mg Oral Daily   Continuous Infusions:  ampicillin-sulbactam (UNASYN) IV 3 g (07/03/22 0342)   vancomycin Stopped (07/03/22 0349)     LOS: 7 days    Time spent: 25 mins     Charise Killian, MD [facility] Pager [phone]  If 7PM-7AM, please contact night-coverage [email or web address] 07/03/2022, 7:44 AM

## 2022-07-04 DIAGNOSIS — R7881 Bacteremia: Secondary | ICD-10-CM | POA: Diagnosis not present

## 2022-07-04 DIAGNOSIS — L089 Local infection of the skin and subcutaneous tissue, unspecified: Secondary | ICD-10-CM | POA: Diagnosis not present

## 2022-07-04 DIAGNOSIS — I739 Peripheral vascular disease, unspecified: Secondary | ICD-10-CM | POA: Diagnosis not present

## 2022-07-04 DIAGNOSIS — B9562 Methicillin resistant Staphylococcus aureus infection as the cause of diseases classified elsewhere: Secondary | ICD-10-CM | POA: Diagnosis not present

## 2022-07-04 DIAGNOSIS — M8608 Acute hematogenous osteomyelitis, other sites: Secondary | ICD-10-CM | POA: Diagnosis not present

## 2022-07-04 DIAGNOSIS — E11628 Type 2 diabetes mellitus with other skin complications: Secondary | ICD-10-CM | POA: Diagnosis not present

## 2022-07-04 LAB — CBC
HCT: 39.3 % (ref 39.0–52.0)
Hemoglobin: 13.3 g/dL (ref 13.0–17.0)
MCH: 30.4 pg (ref 26.0–34.0)
MCHC: 33.8 g/dL (ref 30.0–36.0)
MCV: 89.7 fL (ref 80.0–100.0)
Platelets: 399 10*3/uL (ref 150–400)
RBC: 4.38 MIL/uL (ref 4.22–5.81)
RDW: 12.6 % (ref 11.5–15.5)
WBC: 12.4 10*3/uL — ABNORMAL HIGH (ref 4.0–10.5)
nRBC: 0 % (ref 0.0–0.2)

## 2022-07-04 LAB — BASIC METABOLIC PANEL
Anion gap: 9 (ref 5–15)
BUN: 10 mg/dL (ref 6–20)
CO2: 25 mmol/L (ref 22–32)
Calcium: 9 mg/dL (ref 8.9–10.3)
Chloride: 102 mmol/L (ref 98–111)
Creatinine, Ser: 0.7 mg/dL (ref 0.61–1.24)
GFR, Estimated: >60 mL/min (ref >60)
Glucose, Bld: 158 mg/dL — ABNORMAL HIGH (ref 70–99)
Potassium: 4 mmol/L (ref 3.5–5.1)
Sodium: 136 mmol/L (ref 135–145)

## 2022-07-04 LAB — CULTURE, BLOOD (ROUTINE X 2): Culture: NO GROWTH

## 2022-07-04 LAB — GLUCOSE, CAPILLARY
Glucose-Capillary: 176 mg/dL — ABNORMAL HIGH (ref 70–99)
Glucose-Capillary: 166 mg/dL — ABNORMAL HIGH (ref 70–99)
Glucose-Capillary: 173 mg/dL — ABNORMAL HIGH (ref 70–99)
Glucose-Capillary: 217 mg/dL — ABNORMAL HIGH (ref 70–99)

## 2022-07-04 LAB — MISC LABCORP TEST (SEND OUT): Labcorp test code: 96388

## 2022-07-04 MED ORDER — POLYETHYLENE GLYCOL 3350 17 G PO PACK: 17.0000 g | PACK | Freq: Every day | ORAL | Status: DC | PRN

## 2022-07-04 NOTE — Progress Notes (Signed)
Pharmacy Antibiotic Note  Martin Mullen is a 42 y.o. male admitted on 06/26/2022 with MRSA bacteremia from diabetic foot ulcer and osteomyelitis.  Pharmacy has been consulted for Vancomycin and Cefepime  dosing He is s/p I&D and right 2nd toe amputation on 5/13  Today, 07/04/2022 Day #8 antibiotics - currently on  vancomycin ampicillin/sulbactam (previously on cefepime)  Renal: SCr stable and WNL WBC 12.4 Afebile 5/12 blood cx: 2/2 sets MRSA 5/15 repeat Bcx NGTD 5/13 Bone: MRSA (susc bactrim, linezolid), E coli (Pan-susc), viridans streptococcus and corynebacterium ECHO 5/14 - no vegetation TEE 5/17 - no vegetation  Angioplasty and stent 5/15  Vancomycin Levels: On vancomycin 1500mg  IV q12h (dose given 5/15 at 22:00) Vancomycin peak 5/16 at02:03 = 24 mcg/mL Vancomycin trough 5/16 at 10:06 = 13 mcg/mL AUC = 475.8, Cmax = 28.1 Cmin = 13.1  Plan: Continue Vancomycin 1500 mg IV Q12H (goal AUC 400-600) AUC at goal per levels 5/16 (as above) Ampicillin/sulbactam 3gm IV q6h Follow renal function   Height: 5\' 10"  (177.8 cm) Weight: 92.5 kg (203 lb 14.8 oz) IBW/kg (Calculated) : 73  Temp (24hrs), Avg:98.2 F (36.8 C), Min:97.9 F (36.6 C), Max:98.5 F (36.9 C)  Recent Labs  Lab 06/30/22 0203 06/30/22 1006 07/01/22 0512 07/02/22 0446 07/03/22 0435 07/04/22 0554  WBC 15.0*  --  20.3* 13.9* 11.7* 12.4*  CREATININE 0.82  --  0.78 0.78 0.75 0.70  VANCOTROUGH  --  13*  --   --   --   --   VANCOPEAK 24*  --   --   --   --   --      Estimated Creatinine Clearance: 137.5 mL/min (by C-G formula based on SCr of 0.7 mg/dL).    No Known Allergies  Antimicrobials this admission:   Vancomycin 5/13 >>    Cefepime 5/13 >> 5/16   Ampicillin/sulb 5/16 >>  Dose adjustments this admission:  Microbiology results: 5/12 blood cx: 2/2 sets MRSA 5/15 repeat Bcx NGTD 5/13 Bone:MRSA and E coli (pan-susc)  Thank you for allowing pharmacy to be a part of this patient's care.  Juliette Alcide, PharmD, BCPS, BCIDP Work Cell: [phone] 07/04/2022 10:43 AM

## 2022-07-04 NOTE — Progress Notes (Signed)
Date of Admission:  06/26/2022     ID: Martin Mullen is a 42 y.o. 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)   Recurrent strokes (HCC)   Cellulitis of right foot   History of Fournier's gangrene   Chronic anticoagulation   Diabetic foot ulcer associated with type 2 diabetes mellitus (HCC)   Plantar ulcer of right foot, with fat layer exposed (HCC)   PAD (peripheral artery disease) (HCC)   Hypokalemia   MRSA bacteremia   Diabetic infection of right foot (HCC)   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)   Open wound   Ulcerated, foot, left, with fat layer exposed (HCC)    Subjective: Doing okay Sitting in chair  Medications:   apixaban  5 mg Oral BID   aspirin EC  81 mg Oral Daily   buPROPion  150 mg Oral Daily   butamben-tetracaine-benzocaine  1 spray Topical Once   insulin aspart  0-15 Units Subcutaneous TID WC   insulin aspart  0-5 Units Subcutaneous QHS   insulin glargine-yfgn  16 Units Subcutaneous Daily   lidocaine  15 mL Mouth/Throat Once   losartan  25 mg Oral Daily   nicotine  21 mg Transdermal Daily   rosuvastatin  20 mg Oral Daily    Objective: Vital signs in last 24 hours: Patient Vitals for the past 24 hrs:  BP Temp Temp src Pulse Resp SpO2  07/04/22 0804 119/85 98.2 F (36.8 C) Oral 87 18 99 %  07/04/22 0451 135/84 97.9 F (36.6 C) Oral 84 16 99 %  07/03/22 2004 122/80 98.5 F (36.9 C) Oral 81 16 99 %      PHYSICAL EXAM:  General: Alert, cooperative,slow speech,  Lungs: Clear to auscultation bilaterally. No Wheezing or Rhonchi. No rales. Heart: Regular rate and rhythm, no murmur, rub or gallop. Abdomen: Soft, non-tender,not  distended. Bowel sounds normal. 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 Or bruising Lymph: Cervical, supraclavicular normal. Neurologic: Minimal  weakness left side ( lower extremity)  Lab Results    Latest Ref Rng & Units 07/04/2022    5:54 AM 07/03/2022    4:35 AM 07/02/2022    4:46 AM  CBC  WBC 4.0 - 10.5 K/uL 12.4  11.7  13.9   Hemoglobin 13.0 - 17.0 g/dL 16.1  09.6  04.5   Hematocrit 39.0 - 52.0 % 39.3  36.3  36.4   Platelets 150 - 400 K/uL 399  362  322        Latest Ref Rng & Units 07/04/2022    5:54 AM 07/03/2022    4:35 AM 07/02/2022    4:46 AM  CMP  Glucose 70 - 99 mg/dL 409  811  914   BUN 6 - 20 mg/dL 10  10  13    Creatinine 0.61 - 1.24 mg/dL 7.82  9.56  2.13   Sodium 135 - 145 mmol/L 136  135  136   Potassium 3.5 - 5.1 mmol/L 4.0  3.9  3.9   Chloride 98 - 111 mmol/L 102  103  104   CO2 22 - 32 mmol/L 25  26  25    Calcium 8.9 - 10.3 mg/dL 9.0  8.4  8.3       Microbiology: Blood culture 06/26/22 2 out of 4 MRSA 06/29/22 B 2 sets NG  Wound cultures MRSA and Ecoli    Assessment/Plan:  ?MRSA bacteremia- source is the foot He is currently on vancomycin  repeat blood cultures 06/29/22 no growth TEE no endocarditis   Rt foot infection- 2nd toe abscess/osteo S/p I/D , 2nd toe ray excision Culture MRSA and ecoli Pathology acute osteo extending to proximal margin of 2 nd toe 3d toe biopsy no osteo As no endocarditis, will give   10 days of Iv vanco, ( 07/06/22) and then switch to  PO linezolid Pt currently on vanco and unasyn  Not a candidate for home IV due to social circumstances and patient does not want to go home on IV anyway , prefers PO antibiotic    Diabetes mellitus with peripheral neuropathy with DFI Callus left foot On metformin and insulin  HTN   CAD s/p stent  Recurrent CVA due to prothrombin gene mutation on eliquis   Current  smoker   ? Discussed the management with the patient and care team Seen patient with Dr.Standiford ID will follow him peripherally this weekend

## 2022-07-04 NOTE — Progress Notes (Addendum)
Mobility Specialist - Progress Note   07/04/22 1600  Mobility  Activity Ambulated with assistance in room  Level of Assistance Standby assist, set-up cues, supervision of patient - no hands on  Assistive Device Front wheel walker  Distance Ambulated (ft) 20 ft  Activity Response Tolerated well  $Mobility charge 1 Mobility     Pt sitting in recliner upon arrival, utilizing RA. Pt agreeable to activity. CAM boot arrived to room. Pt able to demonstrate donning CAM boot without physical assist + extra time. No reports of pain/dizziness throughout session. STS and ambulation with minG. Mildly unsteady during ambulation with 1 LOB, corrected by CGA. VC to keep RW on floor and navigating RW through tight spaces. Pt returned to recliner with needs in reach. RN notified.    Martin Mullen Mobility Specialist 07/04/22, 4:43 PM

## 2022-07-04 NOTE — Progress Notes (Signed)
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)   Recurrent strokes (HCC)   History of Fournier's gangrene   Chronic anticoagulation   Plantar ulcer of right foot, with fat layer exposed (HCC)   PAD (peripheral artery disease) (HCC)   Hypokalemia   MRSA bacteremia   Diabetic infection of right foot (HCC)   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 S/p washout I&D of right foot infection on 06/27/2022. S/p debridement, graft, & VAC application 06/30/22 as per podiatry. Continue on IV vanco, unasyn as per ID. Podiatry & ID following & recs apprec. See Dr. Helayne Seminole notes on how pt met sepsis criteria. Sepsis resolved    MRSA bacteremia: continue on IV vanco, unasyn as per ID. TEE was neg for endocarditis   Hypokalemia: WNL today   Hyponatremia: resolved    Hx of CAD: w/ bare-metal stent to distal RCA in 2014. Continue on aspirin, statin    PVD: s/p RLE angiogram in which angioplasty of right popliteal artery, right tibioperoneal trunk, posterior tibial artery was done & stent placed into the right popliteal artery on 06/29/22. Continue on aspirin, statin     Heterozygous for prothrombin gene 20210A mutation: w/ recurrent strokes. Continue on eliquis    DMII: HbA1c 8.2, poorly controlled. Continue on glargine, SSI w/ accuchecks   Overweight: BMI 29.2. Would benefit from weight loss   DVT prophylaxis: IV heparin  Code Status: full  Family Communication:  Disposition Plan: likely d/c back home  Level of care: Telemetry Medical  Status is: Inpatient Remains inpatient appropriate because: severity of illness, still requiring IV abxs & can be d/c on 5/22 as per ID   Consultants:  Vasc surg ID  Cardio   Procedures:   Antimicrobials: unasyn, vanco   Subjective: Pt c/o malaise   Objective: Vitals:   07/02/22 1934 07/03/22 0347 07/03/22 2004 07/04/22 0451  BP: 119/81 132/85 122/80 135/84  Pulse: 95 83 81 84  Resp: 20 18 16 16   Temp: 98.6 F (37 C) 99.1 F (37.3 C) 98.5 F (36.9 C) 97.9 F (36.6 C)  TempSrc: Oral Oral Oral Oral  SpO2: 97% 96% 99% 99%  Weight:      Height:        Intake/Output Summary (Last 24 hours) at 07/04/2022 0756 Last data filed at 07/04/2022 0500 Gross per 24 hour  Intake --  Output 1200 ml  Net -1200 ml   Filed Weights   06/26/22 1838 06/30/22 1116  Weight: 92.5 kg 92.5 kg    Examination:  General exam: Appears comfortable  Respiratory system: clear breath sounds b/l  Cardiovascular system: S1/S2+. No rubs or clicks   Gastrointestinal system:  abd is soft, NT, obese & normal bowel sounds  Central nervous system: Alert & awake. Moves all extremities  Psychiatry: judgement and insight appears at baseline. Flat mood and affect    Data Reviewed: I have personally reviewed following labs and imaging studies  CBC: Recent Labs  Lab 06/28/22 0559 06/29/22 0250 06/30/22 0203 07/01/22 0512 07/02/22 0446 07/03/22 0435 07/04/22 0554  WBC 17.1*   < > 15.0* 20.3* 13.9* 11.7* 12.4*  NEUTROABS 12.9*  --   --   --   --   --   --   HGB 14.9   < > 13.4 13.3 12.3* 12.4* 13.3  HCT 44.3   < >  39.2 37.8* 36.4* 36.3* 39.3  MCV 90.8   < > 88.3 88.7 89.9 89.4 89.7  PLT 277   < > 283 337 322 362 399   < > = values in this interval not displayed.   Basic Metabolic Panel: Recent Labs  Lab 06/28/22 0559 06/29/22 1240 06/30/22 0203 07/01/22 0512 07/02/22 0446 07/03/22 0435 07/04/22 0554  NA 135   < > 131* 134* 136 135 136  K 3.6   < > 4.1 4.4 3.9 3.9 4.0  CL 101   < > 98 102 104 103 102  CO2 24   < > 23 23 25 26 25   GLUCOSE 153*   < > 198* 277* 268* 164* 158*  BUN 8   < > 9 14 13 10 10   CREATININE 0.87   < > 0.82 0.78 0.78 0.75 0.70  CALCIUM 8.4*   < > 8.4* 8.6* 8.3* 8.4* 9.0  MG 1.8  --   --   --   --   --   --    < > = values in this interval not displayed.   GFR: Estimated Creatinine Clearance: 137.5 mL/min (by C-G formula based on SCr of 0.7 mg/dL). Liver Function Tests: No results for input(s): "AST", "ALT", "ALKPHOS", "BILITOT", "PROT", "ALBUMIN" in the last 168 hours.  No results for input(s): "LIPASE", "AMYLASE" in the last 168 hours. No results for input(s): "AMMONIA" in the last 168 hours. Coagulation Profile: Recent Labs  Lab 06/28/22 1116  INR 1.2   Cardiac Enzymes: No results for input(s): "CKTOTAL", "CKMB", "CKMBINDEX", "TROPONINI" in the last 168 hours. BNP (last 3 results) No results for input(s): "PROBNP" in the last 8760 hours. HbA1C: No results for input(s): "HGBA1C" in the last 72 hours. CBG: Recent Labs  Lab 07/03/22 0759 07/03/22 1156 07/03/22 1728 07/03/22 1906 07/03/22 2112  GLUCAP 162* 232* 472* 143* 99   Lipid Profile: No results for input(s): "CHOL", "HDL", "LDLCALC", "TRIG", "CHOLHDL", "LDLDIRECT" in the last 72 hours. Thyroid Function Tests: No results for input(s): "TSH", "T4TOTAL", "FREET4", "T3FREE", "THYROIDAB" in the last 72 hours. Anemia Panel: No results for input(s): "VITAMINB12", "FOLATE", "FERRITIN", "TIBC", "IRON", "RETICCTPCT" in the last 72 hours. Sepsis Labs: No results for input(s): "PROCALCITON",  "LATICACIDVEN" in the last 168 hours.   Recent Results (from the past 240 hour(s))  Blood culture (single)     Status: Abnormal (Preliminary result)   Collection Time: 06/26/22  7:56 PM   Specimen: BLOOD  Result Value Ref Range Status   Specimen Description   Final    BLOOD RIGHT ANTECUBITAL Performed at [facility], [address], [place], [place] [address]    Special Requests   Final    BOTTLES DRAWN AEROBIC AND ANAEROBIC Blood Culture adequate volume Performed at [facility], [address] [place], [place] [address]    Culture  Setup Time   Final    GRAM POSITIVE COCCI AEROBIC BOTTLE ONLY CRITICAL RESULT CALLED TO, READ BACK BY AND VERIFIED WITH: DEVAN MITCHELL 06/27/2022 1124 CP    Culture (A)  Final    METHICILLIN RESISTANT STAPHYLOCOCCUS AUREUS Sent to [facility] for further susceptibility testing. Performed at [facility], [address], [place], [place] [address]    Report Status PENDING  Incomplete   Organism ID, Bacteria METHICILLIN RESISTANT STAPHYLOCOCCUS AUREUS  Final      Susceptibility   Methicillin resistant staphylococcus aureus - MIC*    CIPROFLOXACIN <=0.5 SENSITIVE Sensitive     ERYTHROMYCIN 0.5 SENSITIVE Sensitive     GENTAMICIN <=0.5 SENSITIVE Sensitive     OXACILLIN >=4 RESISTANT Resistant     TETRACYCLINE >=16 RESISTANT Resistant     VANCOMYCIN 1 SENSITIVE Sensitive     TRIMETH/SULFA <=10 SENSITIVE Sensitive     CLINDAMYCIN <=0.25 SENSITIVE Sensitive     RIFAMPIN <=0.5 SENSITIVE Sensitive     Inducible Clindamycin NEGATIVE Sensitive     LINEZOLID 2 SENSITIVE Sensitive     * METHICILLIN RESISTANT STAPHYLOCOCCUS AUREUS  Blood Culture ID Panel (Reflexed)     Status: Abnormal   Collection Time: 06/26/22  7:56 PM  Result Value Ref Range Status   Enterococcus faecalis NOT DETECTED NOT DETECTED Final   Enterococcus Faecium NOT DETECTED NOT DETECTED Final   Listeria monocytogenes NOT DETECTED NOT DETECTED Final    Staphylococcus species DETECTED (A) NOT DETECTED Final    Comment: CRITICAL RESULT CALLED TO, READ BACK BY AND VERIFIED WITH: DEVAN MITCHELL 06/27/2022 1124 CP    Staphylococcus aureus (BCID) DETECTED (A) NOT DETECTED Final    Comment: Methicillin (oxacillin)-resistant Staphylococcus aureus (MRSA). MRSA is predictably resistant to beta-lactam antibiotics (except ceftaroline). Preferred therapy is vancomycin unless clinically contraindicated. Patient requires contact precautions if  hospitalized. CRITICAL RESULT CALLED TO, READ BACK BY AND VERIFIED WITH: DEVAN MITCHELL 06/27/2022 1124 CP    Staphylococcus epidermidis NOT DETECTED NOT DETECTED Final   Staphylococcus lugdunensis NOT DETECTED NOT DETECTED Final   Streptococcus species NOT DETECTED NOT DETECTED Final   Streptococcus agalactiae NOT DETECTED NOT DETECTED Final   Streptococcus pneumoniae NOT DETECTED NOT DETECTED Final   Streptococcus pyogenes NOT DETECTED NOT DETECTED Final   A.calcoaceticus-baumannii NOT DETECTED NOT DETECTED Final   Bacteroides fragilis NOT DETECTED NOT DETECTED Final   Enterobacterales NOT DETECTED NOT DETECTED Final   Enterobacter cloacae complex NOT DETECTED NOT DETECTED Final   Escherichia coli NOT DETECTED NOT DETECTED Final   Klebsiella aerogenes NOT DETECTED NOT DETECTED Final   Klebsiella oxytoca NOT DETECTED NOT DETECTED Final   Klebsiella pneumoniae NOT DETECTED NOT DETECTED Final   Proteus species NOT DETECTED NOT DETECTED Final   Salmonella species NOT DETECTED NOT DETECTED Final   Serratia marcescens NOT DETECTED NOT DETECTED Final   Haemophilus influenzae NOT DETECTED NOT DETECTED Final   Neisseria meningitidis NOT DETECTED NOT DETECTED Final   Pseudomonas aeruginosa NOT DETECTED NOT DETECTED Final   Stenotrophomonas maltophilia NOT DETECTED NOT DETECTED Final   Candida albicans NOT DETECTED NOT DETECTED Final   Candida auris NOT DETECTED NOT DETECTED Final   Candida glabrata NOT DETECTED NOT  DETECTED Final   Candida krusei NOT DETECTED NOT DETECTED Final   Candida parapsilosis NOT DETECTED NOT DETECTED Final   Candida tropicalis NOT DETECTED NOT DETECTED Final   Cryptococcus neoformans/gattii NOT DETECTED NOT DETECTED Final   Meth resistant mecA/C and  MREJ DETECTED (A) NOT DETECTED Final    Comment: CRITICAL RESULT CALLED TO, READ BACK BY AND VERIFIED WITH: [facility] MITCHELL 06/27/2022 1124 CP Performed at [facility], [address] [place], [place] [address]   Blood culture (single)     Status: Abnormal   Collection Time: 06/26/22  8:04 PM   Specimen: BLOOD  Result Value Ref Range Status   Specimen Description   Final    BLOOD LEFT ANTECUBITAL Performed at [facility], [address] [place], [place] [address]    Special Requests   Final    BOTTLES DRAWN AEROBIC AND ANAEROBIC Blood Culture adequate volume Performed at [facility], [address] [place], [place] [address]    Culture  Setup Time   Final    GRAM POSITIVE COCCI AEROBIC BOTTLE ONLY Organism ID to follow CRITICAL RESULT CALLED TO, READ BACK BY AND VERIFIED WITH: [email or web address]  06/28/22 RH Performed at [facility], [address] [place], [place] [address]    Culture (A)  Final    STAPHYLOCOCCUS AUREUS SUSCEPTIBILITIES PERFORMED ON PREVIOUS CULTURE WITHIN THE LAST 5 DAYS. Performed at [facility], [address], [place], [place] [address]    Report Status 06/29/2022 FINAL  Final  Blood Culture ID Panel (Reflexed)     Status: Abnormal   Collection Time: 06/26/22  8:04 PM  Result Value Ref Range Status   Enterococcus faecalis NOT DETECTED NOT DETECTED Final   Enterococcus Faecium NOT DETECTED NOT DETECTED Final   Listeria monocytogenes NOT DETECTED NOT DETECTED Final   Staphylococcus species DETECTED (A) NOT DETECTED Final    Comment: CRITICAL RESULT CALLED TO, READ BACK BY AND VERIFIED WITH: NATHAN [email or web address]  06/28/22 RH    Staphylococcus aureus  (BCID) DETECTED (A) NOT DETECTED Final    Comment: Methicillin (oxacillin)-resistant Staphylococcus aureus (MRSA). MRSA is predictably resistant to beta-lactam antibiotics (except ceftaroline). Preferred therapy is vancomycin unless clinically contraindicated. Patient requires contact precautions if  hospitalized. CRITICAL RESULT CALLED TO, READ BACK BY AND VERIFIED WITH: [email or web address]  06/28/22 RH    Staphylococcus epidermidis NOT DETECTED NOT DETECTED Final   Staphylococcus lugdunensis NOT DETECTED NOT DETECTED Final   Streptococcus species NOT DETECTED NOT DETECTED Final   Streptococcus agalactiae NOT DETECTED NOT DETECTED Final   Streptococcus pneumoniae NOT DETECTED NOT DETECTED Final   Streptococcus pyogenes NOT DETECTED NOT DETECTED Final   A.calcoaceticus-baumannii NOT DETECTED NOT DETECTED Final   Bacteroides fragilis NOT DETECTED NOT DETECTED Final   Enterobacterales NOT DETECTED NOT DETECTED Final   Enterobacter cloacae complex NOT DETECTED NOT DETECTED Final   Escherichia coli NOT DETECTED NOT DETECTED Final   Klebsiella aerogenes NOT DETECTED NOT DETECTED Final   Klebsiella oxytoca NOT DETECTED NOT DETECTED Final   Klebsiella pneumoniae NOT DETECTED NOT DETECTED Final   Proteus species NOT DETECTED NOT DETECTED Final   Salmonella species NOT DETECTED NOT DETECTED Final   Serratia marcescens NOT DETECTED NOT DETECTED Final   Haemophilus influenzae NOT DETECTED NOT DETECTED Final   Neisseria meningitidis NOT DETECTED NOT DETECTED Final   Pseudomonas aeruginosa NOT DETECTED NOT DETECTED Final   Stenotrophomonas maltophilia NOT DETECTED NOT DETECTED Final   Candida albicans NOT DETECTED NOT DETECTED Final   Candida auris NOT DETECTED NOT DETECTED Final   Candida glabrata NOT DETECTED NOT DETECTED Final   Candida krusei NOT DETECTED NOT DETECTED Final   Candida parapsilosis NOT DETECTED NOT DETECTED Final   Candida tropicalis NOT DETECTED NOT DETECTED Final   Cryptococcus  neoformans/gattii NOT DETECTED NOT DETECTED Final   Meth resistant mecA/C and MREJ DETECTED (A) NOT DETECTED Corrected    Comment: CRITICAL RESULT CALLED TO, READ BACK BY AND VERIFIED WITH: NATHAN [email or web address]  06/28/22 RH Performed at [facility], [address] [place], [place] [address] CORRECTED ON 05/14 AT 0802: PREVIOUSLY REPORTED AS DETECTED NATHAN [email or web address]@[email or web address]  06/28/22 RH   Aerobic/Anaerobic Culture w Gram Stain (surgical/deep wound)     Status: None   Collection Time: 06/27/22  9:09 AM   Specimen: Wound; Tissue  Result Value Ref Range Status   Specimen Description   Final    BONE Performed at [facility], [address] [place], [place] [address]    Special Requests   Final    NONE Performed at [facility], [address] [place], [place] [address]    Gram Stain NO WBC SEEN NO ORGANISMS SEEN   Final   Culture   Final    FEW METHICILLIN RESISTANT STAPHYLOCOCCUS AUREUS FEW ESCHERICHIA COLI RARE VIRIDANS STREPTOCOCCUS RARE CORYNEBACTERIUM SPECIES Standardized susceptibility testing for this organism is not available. NO ANAEROBES ISOLATED Performed at [facility], [address], [place], [place] [address]    Report Status 07/02/2022 FINAL  Final   Organism ID, Bacteria METHICILLIN RESISTANT STAPHYLOCOCCUS AUREUS  Final   Organism ID, Bacteria ESCHERICHIA COLI  Final      Susceptibility   Escherichia coli - MIC*    AMPICILLIN <=2 SENSITIVE Sensitive     CEFEPIME <=0.12 SENSITIVE Sensitive     CEFTAZIDIME <=1 SENSITIVE Sensitive     CEFTRIAXONE <=0.25 SENSITIVE Sensitive     CIPROFLOXACIN <=0.25 SENSITIVE Sensitive     GENTAMICIN <=1 SENSITIVE Sensitive     IMIPENEM <=0.25 SENSITIVE Sensitive     TRIMETH/SULFA <=20 SENSITIVE Sensitive     AMPICILLIN/SULBACTAM <=2 SENSITIVE Sensitive     PIP/TAZO <=4 SENSITIVE Sensitive     * FEW ESCHERICHIA COLI   Methicillin resistant staphylococcus aureus - MIC*    CIPROFLOXACIN <=0.5 SENSITIVE  Sensitive     ERYTHROMYCIN 0.5 SENSITIVE Sensitive     GENTAMICIN <=0.5 SENSITIVE Sensitive     OXACILLIN >=4 RESISTANT Resistant     TETRACYCLINE >=16 RESISTANT Resistant     VANCOMYCIN <=0.5 SENSITIVE Sensitive     TRIMETH/SULFA <=10 SENSITIVE Sensitive     CLINDAMYCIN <=0.25 SENSITIVE Sensitive     RIFAMPIN <=0.5 SENSITIVE Sensitive     Inducible Clindamycin NEGATIVE Sensitive     LINEZOLID 2 SENSITIVE Sensitive     * FEW METHICILLIN RESISTANT STAPHYLOCOCCUS AUREUS  Culture, blood (Routine X 2) w Reflex to ID Panel     Status: None (Preliminary result)   Collection Time: 06/29/22  2:50 AM   Specimen: BLOOD  Result Value Ref Range Status   Specimen Description BLOOD RIGHT HAND  Final   Special Requests   Final    BOTTLES DRAWN AEROBIC AND ANAEROBIC Blood Culture adequate volume   Culture   Final    NO GROWTH 4 DAYS Performed at [facility], [address] [place], [place] [address]    Report Status PENDING  Incomplete  Culture, blood (Routine X 2) w Reflex to ID Panel     Status: None (Preliminary result)   Collection Time: 06/29/22  2:50 AM   Specimen: BLOOD  Result Value Ref Range Status   Specimen Description BLOOD RIGHT ARM  Final   Special Requests   Final    BOTTLES DRAWN AEROBIC AND ANAEROBIC Blood Culture  adequate volume   Culture   Final    NO GROWTH 4 DAYS Performed at [facility], [address], [place], [place] [address]    Report Status PENDING  Incomplete         Radiology Studies: No results found.      Scheduled Meds:  apixaban  5 mg Oral BID   aspirin EC  81 mg Oral Daily   buPROPion  150 mg Oral Daily   butamben-tetracaine-benzocaine  1 spray Topical Once   insulin aspart  0-15 Units Subcutaneous TID WC   insulin aspart  0-5 Units Subcutaneous QHS   insulin glargine-yfgn  16 Units Subcutaneous Daily   lactulose  20 g Oral BID   lidocaine  15 mL Mouth/Throat Once   losartan  25 mg Oral Daily   nicotine  21 mg  Transdermal Daily   polyethylene glycol  17 g Oral Daily   rosuvastatin  20 mg Oral Daily   Continuous Infusions:  ampicillin-sulbactam (UNASYN) IV 3 g (07/04/22 0404)   vancomycin 1,500 mg (07/03/22 2222)     LOS: 8 days    Time spent: 25 mins     Charise Killian, MD [facility] Pager [phone]  If 7PM-7AM, please contact night-coverage [email or web address] 07/04/2022, 7:56 AM

## 2022-07-05 ENCOUNTER — Other Ambulatory Visit: Payer: Self-pay

## 2022-07-05 DIAGNOSIS — R7881 Bacteremia: Secondary | ICD-10-CM | POA: Diagnosis not present

## 2022-07-05 DIAGNOSIS — M8618 Other acute osteomyelitis, other site: Secondary | ICD-10-CM | POA: Diagnosis not present

## 2022-07-05 DIAGNOSIS — E1169 Type 2 diabetes mellitus with other specified complication: Secondary | ICD-10-CM | POA: Diagnosis not present

## 2022-07-05 DIAGNOSIS — E11621 Type 2 diabetes mellitus with foot ulcer: Secondary | ICD-10-CM | POA: Diagnosis not present

## 2022-07-05 LAB — GLUCOSE, CAPILLARY
Glucose-Capillary: 166 mg/dL — ABNORMAL HIGH (ref 70–99)
Glucose-Capillary: 159 mg/dL — ABNORMAL HIGH (ref 70–99)
Glucose-Capillary: 184 mg/dL — ABNORMAL HIGH (ref 70–99)
Glucose-Capillary: 207 mg/dL — ABNORMAL HIGH (ref 70–99)

## 2022-07-05 LAB — SEDIMENTATION RATE: Sed Rate: 55 mm/hr — ABNORMAL HIGH (ref 0–15)

## 2022-07-05 LAB — CREATININE, SERUM
Creatinine, Ser: 0.69 mg/dL (ref 0.61–1.24)
GFR, Estimated: >60 mL/min (ref >60)

## 2022-07-05 LAB — C-REACTIVE PROTEIN: CRP: 3.2 mg/dL — ABNORMAL HIGH (ref <1.0)

## 2022-07-05 MED ORDER — AMOXICILLIN-POT CLAVULANATE 875-125 MG PO TABS
1.0000 | ORAL_TABLET | Freq: Two times a day (BID) | ORAL | 0 refills | Status: AC
  Filled 2022-07-05: qty 42, 21d supply, fill #0

## 2022-07-05 MED ORDER — ORITAVANCIN DIPHOSPHATE 400 MG IV SOLR
1200.0000 mg | Freq: Once | INTRAVENOUS | Status: AC
  Administered 2022-07-06: 1200 mg via INTRAVENOUS
  Filled 2022-07-05: qty 120

## 2022-07-05 MED ORDER — LINEZOLID 600 MG PO TABS
600.0000 mg | ORAL_TABLET | Freq: Two times a day (BID) | ORAL | 0 refills | Status: AC
  Filled 2022-07-05: qty 23, 12d supply, fill #0
  Filled 2022-07-05: qty 19, 9d supply, fill #0

## 2022-07-05 NOTE — Progress Notes (Signed)
PROGRESS NOTE   HPI was taken from Dr. Para March: Martin Mullen is a 42 y.o. 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  He denies any injury.  He was previously in his usual state of health.  He denies chest pain or shortness of breath.  Denies fevers or chills. ED course and Data review: Tachycardic in the ED with otherwise normal vitals.  Labs notable for WBC of 23,000 with lactic acid 2.8.  Glucose 293, sodium 133 and potassium 3.4.  X-ray right foot showing soft tissue ulcer near second MTP joint without osteomyelitis. Lower extremity Doppler negative for DVT.  Does show several nonspecific prominent lymph nodes in the right groin Patient started on sepsis fluids as well as vancomycin and cefepime. Hospitalist consulted for admission.    As per Dr. Myriam Forehand: Martin Mullen is a 42 y.o. 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  He presented to the hospital because of pain in the right leg.   He was admitted to the hospital for sepsis secondary to MRSA bacteremia, right foot cellulitis,  acute osteomyelitis of right second metatarsal head and second toe proximal phalanx, septic arthritis of right second MTP joint and right diabetic foot ulcer.  He was treated with empiric IV antibiotics.   As per Dr. Mayford Knife 5/15-5/21/24: Has remained medically stable this week. S/p TEE which was neg for endocarditis. Continue on  IV vanco, unasyn until tomorrow and can switch to po abxs as per ID. See ID's progress notes. Podiatry has already cleared pt for d/c.   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)   Recurrent strokes (HCC)   History of Fournier's gangrene   Chronic anticoagulation   Plantar ulcer of right foot, with fat layer exposed (HCC)   PAD (peripheral artery disease) (HCC)   Hypokalemia   MRSA bacteremia   Diabetic infection of right foot (HCC)   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 S/p washout I&D of right foot infection on 06/27/2022. S/p debridement, graft, & VAC application 06/30/22 as per podiatry. Continue on IV vanco, unasyn & can be switch ted to po abxs tomorrow as per ID. Podiatry & ID following & recs apprec. See Dr. Helayne Seminole notes on how  pt met sepsis criteria. Sepsis resolved    MRSA bacteremia: continue on IV vanco, unasyn until tomorrow & abxs can be changed to po abxs tomorrow as per ID. See ID's note for more information  TEE was neg for endocarditis   Hypokalemia: WNL   Hyponatremia: resolved    Hx of CAD: w/ bare-metal stent to distal RCA in 2014. Continue  on statin, aspirin   PVD: s/p RLE angiogram in which angioplasty of right popliteal artery, right tibioperoneal trunk, posterior tibial artery was done & stent placed into the right popliteal artery on 06/29/22. Continue on aspirin, statin   Heterozygous for prothrombin gene 20210A mutation: w/ recurrent strokes. Continue on eliquis     DMII: poorly controlled, HbA1c 8.2. Continue on glargine, SSI w/ accuchecks   Overweight: BMI 29.2. Would benefit from weight loss BMI 29.2. Would benefit from weight loss   DVT prophylaxis: eliquis  Code Status: full  Family Communication:  Disposition Plan: likely d/c back home  Level of care: Telemetry Medical  Status is: Inpatient Remains inpatient appropriate because: severity of illness, still requiring IV abxs & can be d/c on 5/22 as per ID   Consultants:  Vasc surg ID  Cardio   Procedures:   Antimicrobials: unasyn, vanco   Subjective: Pt c/o malaise   Objective: Vitals:   07/04/22 0451 07/04/22 0804 07/04/22 2238 07/05/22 0430  BP: 135/84 119/85 124/80 129/83  Pulse: 84 87 84   Resp: 16 18 16 15   Temp: 97.9 F (36.6 C) 98.2 F (36.8 C) 98.4 F (36.9 C) 98.6 F (37 C)  TempSrc: Oral Oral  Oral  SpO2: 99% 99% 100% 97%  Weight:      Height:        Intake/Output Summary (Last 24 hours) at 07/05/2022 0759 Last data filed at 07/04/2022 2100 Gross per 24 hour  Intake --  Output 700 ml  Net -700 ml   Filed Weights   06/26/22 1838 06/30/22 1116  Weight: 92.5 kg 92.5 kg    Examination:  General exam: Appears calm & comfortable  Respiratory system: clear breath sounds b/l  Cardiovascular system: S1 & S2+. No rubs or clicks  Gastrointestinal system: abd is soft, NT, ND & normal bowel sounds   Central nervous system: Alert and awake. Moves all extremities  Psychiatry: judgement and insight appears at baseline. Flat mood and affect    Data Reviewed: I have personally reviewed following labs and imaging  studies  CBC: Recent Labs  Lab 06/30/22 0203 07/01/22 0512 07/02/22 0446 07/03/22 0435 07/04/22 0554  WBC 15.0* 20.3* 13.9* 11.7* 12.4*  HGB 13.4 13.3 12.3* 12.4* 13.3  HCT 39.2 37.8* 36.4* 36.3* 39.3  MCV 88.3 88.7 89.9 89.4 89.7  PLT 283 337 322 362 399   Basic Metabolic Panel: Recent Labs  Lab 06/30/22 0203 07/01/22 0512 07/02/22 0446 07/03/22 0435 07/04/22 0554 07/05/22 0503  NA 131* 134* 136 135 136  --   K 4.1 4.4 3.9 3.9 4.0  --   CL 98 102 104 103 102  --   CO2 23 23 25 26 25   --   GLUCOSE 198* 277* 268* 164* 158*  --   BUN 9 14 13 10 10   --   CREATININE 0.82 0.78 0.78 0.75 0.70 0.69  CALCIUM 8.4* 8.6* 8.3* 8.4* 9.0  --    GFR: Estimated Creatinine Clearance: 137.5 mL/min (by C-G formula based on SCr of 0.69 mg/dL). Liver Function Tests: No results for input(s): "AST", "ALT", "ALKPHOS", "BILITOT", "  PROT", "ALBUMIN" in the last 168 hours.  No results for input(s): "LIPASE", "AMYLASE" in the last 168 hours. No results for input(s): "AMMONIA" in the last 168 hours. Coagulation Profile: Recent Labs  Lab 06/28/22 1116  INR 1.2   Cardiac Enzymes: No results for input(s): "CKTOTAL", "CKMB", "CKMBINDEX", "TROPONINI" in the last 168 hours. BNP (last 3 results) No results for input(s): "PROBNP" in the last 8760 hours. HbA1C: No results for input(s): "HGBA1C" in the last 72 hours. CBG: Recent Labs  Lab 07/03/22 2112 07/04/22 0907 07/04/22 1241 07/04/22 1631 07/04/22 2108  GLUCAP 99 176* 166* 173* 217*   Lipid Profile: No results for input(s): "CHOL", "HDL", "LDLCALC", "TRIG", "CHOLHDL", "LDLDIRECT" in the last 72 hours. Thyroid Function Tests: No results for input(s): "TSH", "T4TOTAL", "FREET4", "T3FREE", "THYROIDAB" in the last 72 hours. Anemia Panel: No results for input(s): "VITAMINB12", "FOLATE", "FERRITIN", "TIBC", "IRON", "RETICCTPCT" in the last 72 hours. Sepsis Labs: No results for input(s): "PROCALCITON", "LATICACIDVEN" in the last 168  hours.   Recent Results (from the past 240 hour(s))  Blood culture (single)     Status: Abnormal (Preliminary result)   Collection Time: 06/26/22  7:56 PM   Specimen: BLOOD  Result Value Ref Range Status   Specimen Description   Final    BLOOD RIGHT ANTECUBITAL Performed at [facility], [address] [place], [place] [address]    Special Requests   Final    BOTTLES DRAWN AEROBIC AND ANAEROBIC Blood Culture adequate volume Performed at [facility], [address] [place], [place] [address]    Culture  Setup Time   Final    GRAM POSITIVE COCCI AEROBIC BOTTLE ONLY CRITICAL RESULT CALLED TO, READ BACK BY AND VERIFIED WITH: DEVAN MITCHELL 06/27/2022 1124 CP    Culture (A)  Final    METHICILLIN RESISTANT STAPHYLOCOCCUS AUREUS Sent to [facility] for further susceptibility testing. Performed at [facility], [address], [place], [place] [address]    Report Status PENDING  Incomplete   Organism ID, Bacteria METHICILLIN RESISTANT STAPHYLOCOCCUS AUREUS  Final      Susceptibility   Methicillin resistant staphylococcus aureus - MIC*    CIPROFLOXACIN <=0.5 SENSITIVE Sensitive     ERYTHROMYCIN 0.5 SENSITIVE Sensitive     GENTAMICIN <=0.5 SENSITIVE Sensitive     OXACILLIN >=4 RESISTANT Resistant     TETRACYCLINE >=16 RESISTANT Resistant     VANCOMYCIN 1 SENSITIVE Sensitive     TRIMETH/SULFA <=10 SENSITIVE Sensitive     CLINDAMYCIN <=0.25 SENSITIVE Sensitive     RIFAMPIN <=0.5 SENSITIVE Sensitive     Inducible Clindamycin NEGATIVE Sensitive     LINEZOLID 2 SENSITIVE Sensitive     * METHICILLIN RESISTANT STAPHYLOCOCCUS AUREUS  Blood Culture ID Panel (Reflexed)     Status: Abnormal   Collection Time: 06/26/22  7:56 PM  Result Value Ref Range Status   Enterococcus faecalis NOT DETECTED NOT DETECTED Final   Enterococcus Faecium NOT DETECTED NOT DETECTED Final   Listeria monocytogenes NOT DETECTED NOT DETECTED Final   Staphylococcus species DETECTED (A) NOT  DETECTED Final    Comment: CRITICAL RESULT CALLED TO, READ BACK BY AND VERIFIED WITH: DEVAN MITCHELL 06/27/2022 1124 CP    Staphylococcus aureus (BCID) DETECTED (A) NOT DETECTED Final    Comment: Methicillin (oxacillin)-resistant Staphylococcus aureus (MRSA). MRSA is predictably resistant to beta-lactam antibiotics (except ceftaroline). Preferred therapy is vancomycin unless clinically contraindicated. Patient requires contact precautions if  hospitalized. CRITICAL RESULT CALLED TO, READ BACK BY AND VERIFIED WITH: [facility]  MITCHELL 06/27/2022 1124 CP    Staphylococcus epidermidis NOT DETECTED NOT DETECTED Final   Staphylococcus lugdunensis NOT DETECTED NOT DETECTED Final   Streptococcus species NOT DETECTED NOT DETECTED Final   Streptococcus agalactiae NOT DETECTED NOT DETECTED Final   Streptococcus pneumoniae NOT DETECTED NOT DETECTED Final   Streptococcus pyogenes NOT DETECTED NOT DETECTED Final   A.calcoaceticus-baumannii NOT DETECTED NOT DETECTED Final   Bacteroides fragilis NOT DETECTED NOT DETECTED Final   Enterobacterales NOT DETECTED NOT DETECTED Final   Enterobacter cloacae complex NOT DETECTED NOT DETECTED Final   Escherichia coli NOT DETECTED NOT DETECTED Final   Klebsiella aerogenes NOT DETECTED NOT DETECTED Final   Klebsiella oxytoca NOT DETECTED NOT DETECTED Final   Klebsiella pneumoniae NOT DETECTED NOT DETECTED Final   Proteus species NOT DETECTED NOT DETECTED Final   Salmonella species NOT DETECTED NOT DETECTED Final   Serratia marcescens NOT DETECTED NOT DETECTED Final   Haemophilus influenzae NOT DETECTED NOT DETECTED Final   Neisseria meningitidis NOT DETECTED NOT DETECTED Final   Pseudomonas aeruginosa NOT DETECTED NOT DETECTED Final   Stenotrophomonas maltophilia NOT DETECTED NOT DETECTED Final   Candida albicans NOT DETECTED NOT DETECTED Final   Candida auris NOT DETECTED NOT DETECTED Final   Candida glabrata NOT DETECTED NOT DETECTED Final   Candida krusei NOT  DETECTED NOT DETECTED Final   Candida parapsilosis NOT DETECTED NOT DETECTED Final   Candida tropicalis NOT DETECTED NOT DETECTED Final   Cryptococcus neoformans/gattii NOT DETECTED NOT DETECTED Final   Meth resistant mecA/C and MREJ DETECTED (A) NOT DETECTED Final    Comment: CRITICAL RESULT CALLED TO, READ BACK BY AND VERIFIED WITH: [facility] MITCHELL 06/27/2022 1124 CP Performed at [facility], [address] [place], [place] [address]   Blood culture (single)     Status: Abnormal   Collection Time: 06/26/22  8:04 PM   Specimen: BLOOD  Result Value Ref Range Status   Specimen Description   Final    BLOOD LEFT ANTECUBITAL Performed at [facility], [address] [place], [place] [address]    Special Requests   Final    BOTTLES DRAWN AEROBIC AND ANAEROBIC Blood Culture adequate volume Performed at [facility], [address] [place], [place] [address]    Culture  Setup Time   Final    GRAM POSITIVE COCCI AEROBIC BOTTLE ONLY Organism ID to follow CRITICAL RESULT CALLED TO, READ BACK BY AND VERIFIED WITH: NATHAN [email or web address]  06/28/22 RH Performed at [facility], [address] [place], [place] [address]    Culture (A)  Final    STAPHYLOCOCCUS AUREUS SUSCEPTIBILITIES PERFORMED ON PREVIOUS CULTURE WITHIN THE LAST 5 DAYS. Performed at [facility], [address], [place], [place] [address]    Report Status 06/29/2022 FINAL  Final  Blood Culture ID Panel (Reflexed)     Status: Abnormal   Collection Time: 06/26/22  8:04 PM  Result Value Ref Range Status   Enterococcus faecalis NOT DETECTED NOT DETECTED Final   Enterococcus Faecium NOT DETECTED NOT DETECTED Final   Listeria monocytogenes NOT DETECTED NOT DETECTED Final   Staphylococcus species DETECTED (A) NOT DETECTED Final    Comment: CRITICAL RESULT CALLED TO, READ BACK BY AND VERIFIED WITH: NATHAN BLUE@[email or web address]  06/28/22 RH    Staphylococcus aureus (BCID) DETECTED (A) NOT DETECTED  Final    Comment: Methicillin (oxacillin)-resistant Staphylococcus aureus (MRSA). MRSA is predictably resistant to beta-lactam antibiotics (except ceftaroline). Preferred therapy is vancomycin unless clinically contraindicated. Patient requires contact precautions  if  hospitalized. CRITICAL RESULT CALLED TO, READ BACK BY AND VERIFIED WITH: [email or web address]  06/28/22 RH    Staphylococcus epidermidis NOT DETECTED NOT DETECTED Final   Staphylococcus lugdunensis NOT DETECTED NOT DETECTED Final   Streptococcus species NOT DETECTED NOT DETECTED Final   Streptococcus agalactiae NOT DETECTED NOT DETECTED Final   Streptococcus pneumoniae NOT DETECTED NOT DETECTED Final   Streptococcus pyogenes NOT DETECTED NOT DETECTED Final   A.calcoaceticus-baumannii NOT DETECTED NOT DETECTED Final   Bacteroides fragilis NOT DETECTED NOT DETECTED Final   Enterobacterales NOT DETECTED NOT DETECTED Final   Enterobacter cloacae complex NOT DETECTED NOT DETECTED Final   Escherichia coli NOT DETECTED NOT DETECTED Final   Klebsiella aerogenes NOT DETECTED NOT DETECTED Final   Klebsiella oxytoca NOT DETECTED NOT DETECTED Final   Klebsiella pneumoniae NOT DETECTED NOT DETECTED Final   Proteus species NOT DETECTED NOT DETECTED Final   Salmonella species NOT DETECTED NOT DETECTED Final   Serratia marcescens NOT DETECTED NOT DETECTED Final   Haemophilus influenzae NOT DETECTED NOT DETECTED Final   Neisseria meningitidis NOT DETECTED NOT DETECTED Final   Pseudomonas aeruginosa NOT DETECTED NOT DETECTED Final   Stenotrophomonas maltophilia NOT DETECTED NOT DETECTED Final   Candida albicans NOT DETECTED NOT DETECTED Final   Candida auris NOT DETECTED NOT DETECTED Final   Candida glabrata NOT DETECTED NOT DETECTED Final   Candida krusei NOT DETECTED NOT DETECTED Final   Candida parapsilosis NOT DETECTED NOT DETECTED Final   Candida tropicalis NOT DETECTED NOT DETECTED Final   Cryptococcus neoformans/gattii NOT DETECTED NOT  DETECTED Final   Meth resistant mecA/C and MREJ DETECTED (A) NOT DETECTED Corrected    Comment: CRITICAL RESULT CALLED TO, READ BACK BY AND VERIFIED WITH: NATHAN [email or web address]  06/28/22 RH Performed at [facility], [address] [place], [place] [address] CORRECTED ON 05/14 AT 0802: PREVIOUSLY REPORTED AS DETECTED NATHAN [email or web address]@[email or web address]  06/28/22 RH   Aerobic/Anaerobic Culture w Gram Stain (surgical/deep wound)     Status: None   Collection Time: 06/27/22  9:09 AM   Specimen: Wound; Tissue  Result Value Ref Range Status   Specimen Description   Final    BONE Performed at [facility], [address], [place], [place] [address]    Special Requests   Final    NONE Performed at [facility], [address] [place], [place] [address]    Gram Stain NO WBC SEEN NO ORGANISMS SEEN   Final   Culture   Final    FEW METHICILLIN RESISTANT STAPHYLOCOCCUS AUREUS FEW ESCHERICHIA COLI RARE VIRIDANS STREPTOCOCCUS RARE CORYNEBACTERIUM SPECIES Standardized susceptibility testing for this organism is not available. NO ANAEROBES ISOLATED Performed at [facility], [address], [place], [place] [address]    Report Status 07/02/2022 FINAL  Final   Organism ID, Bacteria METHICILLIN RESISTANT STAPHYLOCOCCUS AUREUS  Final   Organism ID, Bacteria ESCHERICHIA COLI  Final      Susceptibility   Escherichia coli - MIC*    AMPICILLIN <=2 SENSITIVE Sensitive     CEFEPIME <=0.12 SENSITIVE Sensitive     CEFTAZIDIME <=1 SENSITIVE Sensitive     CEFTRIAXONE <=0.25 SENSITIVE Sensitive     CIPROFLOXACIN <=0.25 SENSITIVE Sensitive     GENTAMICIN <=1 SENSITIVE Sensitive     IMIPENEM <=0.25 SENSITIVE Sensitive     TRIMETH/SULFA <=20 SENSITIVE Sensitive     AMPICILLIN/SULBACTAM <=2 SENSITIVE Sensitive     PIP/TAZO <=4 SENSITIVE Sensitive     * FEW ESCHERICHIA COLI   Methicillin  resistant staphylococcus aureus - MIC*    CIPROFLOXACIN <=0.5 SENSITIVE Sensitive     ERYTHROMYCIN 0.5  SENSITIVE Sensitive     GENTAMICIN <=0.5 SENSITIVE Sensitive     OXACILLIN >=4 RESISTANT Resistant     TETRACYCLINE >=16 RESISTANT Resistant     VANCOMYCIN <=0.5 SENSITIVE Sensitive     TRIMETH/SULFA <=10 SENSITIVE Sensitive     CLINDAMYCIN <=0.25 SENSITIVE Sensitive     RIFAMPIN <=0.5 SENSITIVE Sensitive     Inducible Clindamycin NEGATIVE Sensitive     LINEZOLID 2 SENSITIVE Sensitive     * FEW METHICILLIN RESISTANT STAPHYLOCOCCUS AUREUS  Culture, blood (Routine X 2) w Reflex to ID Panel     Status: None   Collection Time: 06/29/22  2:50 AM   Specimen: BLOOD  Result Value Ref Range Status   Specimen Description BLOOD RIGHT HAND  Final   Special Requests   Final    BOTTLES DRAWN AEROBIC AND ANAEROBIC Blood Culture adequate volume   Culture   Final    NO GROWTH 5 DAYS Performed at [facility], [address] [place], [place] [address]    Report Status 07/04/2022 FINAL  Final  Culture, blood (Routine X 2) w Reflex to ID Panel     Status: None   Collection Time: 06/29/22  2:50 AM   Specimen: BLOOD  Result Value Ref Range Status   Specimen Description BLOOD RIGHT ARM  Final   Special Requests   Final    BOTTLES DRAWN AEROBIC AND ANAEROBIC Blood Culture adequate volume   Culture   Final    NO GROWTH 5 DAYS Performed at [facility], [address], [place], [place] [address]    Report Status 07/04/2022 FINAL  Final         Radiology Studies: No results found.      Scheduled Meds:  apixaban  5 mg Oral BID   aspirin EC  81 mg Oral Daily   buPROPion  150 mg Oral Daily   butamben-tetracaine-benzocaine  1 spray Topical Once   insulin aspart  0-15 Units Subcutaneous TID WC   insulin aspart  0-5 Units Subcutaneous QHS   insulin glargine-yfgn  16 Units Subcutaneous Daily   lidocaine  15 mL Mouth/Throat Once   losartan  25 mg Oral Daily   nicotine  21 mg Transdermal Daily   rosuvastatin  20 mg Oral Daily   Continuous Infusions:   ampicillin-sulbactam (UNASYN) IV 3 g (07/05/22 0353)   vancomycin 1,500 mg (07/04/22 2259)     LOS: 9 days    Time spent: 25 mins     Charise Killian, MD [facility] Pager [phone]  If 7PM-7AM, please contact night-coverage [email or web address] 07/05/2022, 7:59 AM

## 2022-07-05 NOTE — Progress Notes (Signed)
   Subjective: S/p partial second ray amputation right foot with repeat irrigation and debridement.  Last surgery DOS: 06/30/2022. Patient doing well.  Resting comfortably in bed.  Dressings are intact  Objective: Vital signs in last 24 hours: Temp:  [97.7 F (36.5 C)-98.6 F (37 C)] 97.7 F (36.5 C) (05/21 1726) Pulse Rate:  [84-100] 97 (05/21 1726) Resp:  [15-16] 16 (05/21 1726) BP: (114-131)/(80-87) 131/84 (05/21 1726) SpO2:  [97 %-100 %] 97 % (05/21 1726)  Intake/Output from previous day: 05/20 0701 - 05/21 0700 In: -  Out: 700 [Urine:700] Intake/Output this shift: Total I/O In: -  Out: 1300 [Urine:1300]  Recent Labs    07/03/22 0435 07/04/22 0554  HGB 12.4* 13.3   Recent Labs    07/03/22 0435 07/04/22 0554  WBC 11.7* 12.4*  RBC 4.06* 4.38  HCT 36.3* 39.3  PLT 362 399   Recent Labs    07/03/22 0435 07/04/22 0554 07/05/22 0503  NA 135 136  --   K 3.9 4.0  --   CL 103 102  --   CO2 26 25  --   BUN 10 10  --   CREATININE 0.75 0.70 0.69  GLUCOSE 164* 158*  --   CALCIUM 8.4* 9.0  --   There are some slight maceration noted to the right second toe amputation site with sutures are intact.  Overall it appears stable.  No malodor.  Scant serous drainage.   Assessment/Plan: S/p partial second amputation with repeat irrigation debridement and delayed primary closure.  DOS: 06/30/2022  -Dressings changed.  Leave clean dry and intact -WBAT surgical shoe -ABX as per ID.  Currently on Vanco and Unasyn.  Not a candidate for home IV ABX due to social circumstances.  Plan to switch to p.o. linezolid as per ID recommendations.  Greatly appreciated -From a podiatry standpoint patient okay for discharge. F/u in office 1 week post discharge  Felecia Shelling 07/05/2022, 6:35 PM

## 2022-07-05 NOTE — Progress Notes (Signed)
Date of Admission:  06/26/2022     ID: Martin Mullen is a 42 y.o. 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)   Recurrent strokes (HCC)   Cellulitis of right foot   History of Fournier's gangrene   Chronic anticoagulation   Diabetic foot ulcer associated with type 2 diabetes mellitus (HCC)   Plantar ulcer of right foot, with fat layer exposed (HCC)   PAD (peripheral artery disease) (HCC)   Hypokalemia   MRSA bacteremia   Diabetic infection of right foot (HCC)   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)   Open wound   Ulcerated, foot, left, with fat layer exposed (HCC)    Subjective: Pt in bed Says he is feeling better  Medications:   apixaban  5 mg Oral BID   aspirin EC  81 mg Oral Daily   buPROPion  150 mg Oral Daily   butamben-tetracaine-benzocaine  1 spray Topical Once   insulin aspart  0-15 Units Subcutaneous TID WC   insulin aspart  0-5 Units Subcutaneous QHS   insulin glargine-yfgn  16 Units Subcutaneous Daily   lidocaine  15 mL Mouth/Throat Once   losartan  25 mg Oral Daily   nicotine  21 mg Transdermal Daily   rosuvastatin  20 mg Oral Daily    Objective: Vital signs in last 24 hours: Patient Vitals for the past 24 hrs:  BP Temp Temp src Pulse Resp SpO2  07/05/22 0943 114/87 98.3 F (36.8 C) Oral 100 16 100 %  07/05/22 0430 129/83 98.6 F (37 C) Oral -- 15 97 %  07/04/22 2238 124/80 98.4 F (36.9 C) -- 84 16 100 %      PHYSICAL EXAM:  General: Alert, cooperative,slow speech,  Lungs: Clear to auscultation bilaterally. No Wheezing or Rhonchi. No rales. Heart: Regular rate and rhythm, no murmur, rub or gallop. Abdomen: Soft,  non-tender,not distended. Bowel sounds normal. No masses Extremities: rt foot- surgical site looks well approximated- no discharge or erythema- 2nd toe amputation         Skin: No rashes or lesions. Or bruising Lymph: Cervical, supraclavicular normal. Neurologic: Minimal  weakness left side ( lower extremity)  Lab Results    Latest Ref Rng & Units 07/04/2022    5:54 AM 07/03/2022    4:35 AM 07/02/2022    4:46 AM  CBC  WBC 4.0 - 10.5 K/uL 12.4  11.7  13.9   Hemoglobin 13.0 - 17.0 g/dL 84.1  32.4  40.1   Hematocrit 39.0 - 52.0 % 39.3  36.3  36.4   Platelets 150 - 400 K/uL 399  362  322        Latest Ref Rng & Units 07/05/2022    5:03 AM 07/04/2022    5:54 AM 07/03/2022    4:35 AM  CMP  Glucose 70 - 99 mg/dL  027  253   BUN 6 - 20 mg/dL  10  10   Creatinine 6.64 - 1.24 mg/dL 4.03  4.74  2.59   Sodium 135 - 145 mmol/L  136  135   Potassium 3.5 - 5.1 mmol/L  4.0  3.9   Chloride 98 - 111 mmol/L  102  103   CO2 22 - 32 mmol/L  25  26   Calcium 8.9 - 10.3 mg/dL  9.0  8.4       Microbiology: Blood culture 06/26/22 2 out of 4 MRSA 06/29/22 B 2 sets NG  Wound cultures MRSA and Ecoli    Assessment/Plan:  ?MRSA bacteremia- source is the foot He is currently on vancomycin  repeat blood cultures 06/29/22 no growth TEE no endocarditis   Rt foot infection- 2nd toe abscess/osteo S/p I/D , 2nd toe ray excision Culture MRSA and ecoli Pathology acute osteo extending to proximal margin of 2 nd toe 3d toe biopsy no osteo As no endocarditis, will give  10 days of Iv vanco, ( 07/06/22) and then switch to  PO linezolid/augmentin for 3 weeks . 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 Discussed the management with the patient in great detail and care team

## 2022-07-05 NOTE — Evaluation (Signed)
Physical Therapy Evaluation Patient Details Name: Martin Mullen MRN: [id number] DOB: 1980-10-24 Today's Date: 07/05/2022  History of Present Illness  42 y.o. 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  He presented to the hospital because of pain in the right leg. He was admitted to the hospital for sepsis secondary to MRSA bacteremia, right foot cellulitis,  acute osteomyelitis of right second metatarsal head and second toe proximal phalanx, septic arthritis of right second MTP joint and right diabetic foot ulcer. 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   Clinical Impression  Patient alert, agreeable to PT, denied pain. Per pt normally he is independent for ADLs, mobility, drives, performs IADLs. Pt reported that the person he lives with is available 24/7 for PRN assistance if needed.  The patient performed supine to sit with CGA, reliance on bed rails. Good sitting balance, pt able to don L shoe without assistance as well as CAM boot. Able to verbalize precautions. Sit <> stand ultimately minA with RW. He ambulated ~66ft with RW and CGA-minA, noted for 1-2 LOB due to insufficient clearance of CAM boot. Returned to room and sitting EOB with OT at end of session.  Overall the patient demonstrated deficits (see "PT Problem List") that impede the patient's functional abilities, safety, and mobility and would benefit from skilled PT intervention. Recommendation is to continue skilled PT services to maximize function and safety.        Recommendations for follow up therapy are one component of a multi-disciplinary discharge planning process, led by the attending physician.  Recommendations may be updated based on patient status,  additional functional criteria and insurance authorization.  Follow Up Recommendations       Assistance Recommended at Discharge Frequent or constant Supervision/Assistance  Patient can return home with the following  A little help with walking and/or transfers;Assistance with cooking/housework;Direct supervision/assist for medications management;Assist for transportation;Help with stairs or ramp for entrance;A little help with bathing/dressing/bathroom    Equipment Recommendations Rolling walker (2 wheels);BSC/3in1  Recommendations for Other Services       Functional Status Assessment Patient has had a recent decline in their functional status and demonstrates the ability to make significant improvements in function in a reasonable and predictable amount of time.     Precautions / Restrictions Precautions Precautions: Fall Restrictions Weight Bearing Restrictions: Yes RLE Weight Bearing: Partial weight bearing Other Position/Activity Restrictions: per orders cam boot for short distances with partial weighting in heel      Mobility  Bed Mobility Overal bed mobility: Needs Assistance Bed Mobility: Supine to Sit     Supine to sit: Min guard, HOB elevated     General bed mobility comments: effortful, timely, use of bed rails    Transfers Overall transfer level: Needs assistance Equipment used: Rolling walker (2 wheels) Transfers: Sit to/from Stand Sit to Stand: Min assist           General transfer comment: several attempts needed to succeed, ultimately minA to stand    Ambulation/Gait Ambulation/Gait assistance: Min guard Gait Distance (Feet): 50 Feet Assistive device: Rolling walker (2 wheels)         General Gait Details: 1-2 LOB due to pt not fully lifting cam boot, minA to correct. cued for RW management to increase safety  Stairs            Wheelchair Mobility    Modified Rankin (Stroke Patients Only)       Balance Overall balance  assessment: Needs assistance Sitting-balance support: Feet supported Sitting balance-Leahy Scale: Good     Standing balance support: Reliant on assistive device for balance Standing balance-Leahy Scale: Fair                               Pertinent Vitals/Pain Pain Assessment Pain Assessment: No/denies pain    Home Living Family/patient expects to be discharged to:: Private residence Living Arrangements: Other (Comment) (her mother's ex-boyfriend)   Type of Home: Mobile home Home Access: Stairs to enter Entrance Stairs-Rails: Right;Left;Can reach both Entrance Stairs-Number of Steps: 5   Home Layout: One level Home Equipment: Agricultural consultant (2 wheels);Cane - single point Additional Comments: Pt this RW is down in ED and lost. 1 fall two weeks ago    Prior Function Prior Level of Function : Independent/Modified Independent;Driving             Mobility Comments: no AD use; last few days with RW due to foot pain       Hand Dominance        Extremity/Trunk Assessment   Upper Extremity Assessment Upper Extremity Assessment: Defer to OT evaluation    Lower Extremity Assessment Lower Extremity Assessment: Generalized weakness       Communication      Cognition Arousal/Alertness: Awake/alert Behavior During Therapy: WFL for tasks assessed/performed Overall Cognitive Status: Within Functional Limits for tasks assessed                                          General Comments      Exercises     Assessment/Plan    PT Assessment Patient needs continued PT services  PT Problem List Decreased mobility;Decreased strength;Decreased range of motion;Decreased activity tolerance;Decreased balance       PT Treatment Interventions DME instruction;Therapeutic activities;Gait training;Therapeutic exercise;Patient/family education;Stair training;Balance training;Functional mobility training;Neuromuscular re-education    PT Goals (Current  goals can be found in the Care Plan section)  Acute Rehab PT Goals Patient Stated Goal: to get up and go home PT Goal Formulation: With patient Time For Goal Achievement: 07/19/22 Potential to Achieve Goals: Good    Frequency Min 3X/week     Co-evaluation               AM-PAC PT "6 Clicks" Mobility  Outcome Measure Help needed turning from your back to your side while in a flat bed without using bedrails?: A Little Help needed moving from lying on your back to sitting on the side of a flat bed without using bedrails?: A Little Help needed moving to and from a bed to a chair (including a wheelchair)?: A Little Help needed standing up from a chair using your arms (e.g., wheelchair or bedside chair)?: A Little Help needed to walk in hospital room?: A Little Help needed climbing 3-5 steps with a railing? : A Lot 6 Click Score: 17    End of Session Equipment Utilized During Treatment: Gait belt Activity Tolerance: Patient tolerated treatment well Patient left: Other (comment) (seated EOB with OT) Nurse Communication: Mobility status PT Visit Diagnosis: Other abnormalities of gait and mobility (R26.89);Muscle weakness (generalized) (M62.81);Difficulty in walking, not elsewhere  classified (R26.2)    Time: 4098-1191 PT Time Calculation (min) (ACUTE ONLY): 20 min   Charges:   PT Evaluation $PT Eval Low Complexity: 1 Low PT Treatments $Therapeutic Activity: 8-22 mins        Olga Coaster PT, DPT 12:55 PM,07/05/22

## 2022-07-05 NOTE — Progress Notes (Signed)
Mobility Specialist - Progress Note   07/05/22 1200  Mobility  Activity Transferred from chair to bed  Level of Assistance Standby assist, set-up cues, supervision of patient - no hands on  Assistive Device Front wheel walker  Distance Ambulated (ft) 4 ft  Activity Response Tolerated well  $Mobility charge 1 Mobility  Mobility Specialist Start Time (ACUTE ONLY) 1100  Mobility Specialist Stop Time (ACUTE ONLY) 1110  Mobility Specialist Time Calculation (min) (ACUTE ONLY) 10 min     Pt sitting in recliner upon arrival, utilizing RA. Post-op shoe arrival to room and donned prior to OOB activity. Pt took a few small pivoting steps towards bed with RW, no LOB. Heel weight-bearing. Pt left in bed with alarm set, needs in reach.     Martin Mullen Mobility Specialist 07/05/22, 12:20 PM

## 2022-07-05 NOTE — Evaluation (Signed)
Occupational Therapy Evaluation Patient Details Name: Martin Mullen MRN: [id number] DOB: 1980-03-09 Today's Date: 07/05/2022   History of Present Illness 42 y.o. 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  He presented to the hospital because of pain in the right leg. He was admitted to the hospital for sepsis secondary to MRSA bacteremia, right foot cellulitis,  acute osteomyelitis of right second metatarsal head and second toe proximal phalanx, septic arthritis of right second MTP joint and right diabetic foot ulcer. 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   Clinical Impression   Pt was seen for OT evaluation this date. Prior to hospital admission, pt was independent with ADL, living with a roommate and 6 cats. Pt EOB after PT session and agreeable to OT. Pt presents to acute OT demonstrating impaired ADL performance and functional mobility 2/2 decreased BUE strength, balance, and RLE PWBing with boot following surgery. Pt currently requires PRN MIN A for LB ADL tasks involving ADL transfers, CGA for ADL mobility in room wiht .  Pt would benefit from skilled OT services to address noted impairments and functional limitations (see below for any additional details) in order to maximize safety and independence while minimizing falls risk and caregiver burden.     Recommendations for follow up therapy are one component of a multi-disciplinary discharge planning process, led by the attending physician.  Recommendations may be updated based on patient status, additional functional criteria and insurance authorization.   Assistance Recommended at Discharge Frequent or constant Supervision/Assistance  Patient can return home with  the following A little help with walking and/or transfers;A little help with bathing/dressing/bathroom;Assistance with cooking/housework;Assist for transportation;Help with stairs or ramp for entrance    Functional Status Assessment  Patient has had a recent decline in their functional status and demonstrates the ability to make significant improvements in function in a reasonable and predictable amount of time.  Equipment Recommendations  BSC/3in1    Recommendations for Other Services       Precautions / Restrictions Precautions Precautions: Fall Restrictions Weight Bearing Restrictions: Yes RLE Weight Bearing: Partial weight bearing Other Position/Activity Restrictions: per orders cam boot for short distances with partial weighting in heel      Mobility Bed Mobility               General bed mobility comments: NT, EOB at start of session, in recliner at end of session.    Transfers Overall transfer level: Needs assistance Equipment used: Rolling walker (2 wheels) Transfers: Sit to/from Stand Sit to Stand: From elevated surface, Min guard           General transfer comment: difficulty from std height bed, requiring elevation (to mimic home set up) with pt improving to CGA-MIN A for STS with RW      Balance Overall balance assessment: Needs assistance Sitting-balance support: Feet supported Sitting balance-Leahy Scale: Good     Standing balance support: Reliant on assistive device for balance Standing balance-Leahy Scale: Fair                             ADL either performed or assessed with clinical judgement   ADL  General ADL Comments: Pt requires MIN A for LB ADL tasks, CGA for toilet transfer +RW and for pericare/clothing mgt from [facility].     Vision         Perception     Praxis      Pertinent Vitals/Pain Pain Assessment Pain Assessment: No/denies pain     Hand Dominance      Extremity/Trunk Assessment Upper Extremity Assessment Upper Extremity Assessment: Overall WFL for tasks assessed   Lower Extremity Assessment Lower Extremity Assessment: Generalized weakness       Communication     Cognition Arousal/Alertness: Awake/alert Behavior During Therapy: WFL for tasks assessed/performed Overall Cognitive Status: Within Functional Limits for tasks assessed                                       General Comments       Exercises Other Exercises Other Exercises: Pt negotiated obstacles in room and around bed with no LOB noted, mildly unsteady requiring CGA throughout with the RW.   Shoulder Instructions      Home Living Family/patient expects to be discharged to:: Private residence Living Arrangements: Other (Comment) (her mother's ex-boyfriend)   Type of Home: Mobile home Home Access: Stairs to enter Entrance Stairs-Number of Steps: 5 Entrance Stairs-Rails: Right;Left;Can reach both Home Layout: One level     Bathroom Shower/Tub: Chief Strategy Officer: Standard     Home Equipment: Agricultural consultant (2 wheels);Cane - single point   Additional Comments: Pt this RW is down in ED and lost. 1 fall two weeks ago      Prior Functioning/Environment Prior Level of Function : Independent/Modified Independent;Driving             Mobility Comments: no AD use; last few days with RW due to foot pain ADLs Comments: indep with ADL        OT Problem List: Decreased strength;Decreased safety awareness;Decreased activity tolerance;Impaired balance (sitting and/or standing);Decreased knowledge of use of DME or AE;Decreased knowledge of precautions      OT Treatment/Interventions: Self-care/ADL training;Therapeutic exercise;Therapeutic activities;DME and/or AE instruction;Patient/family education;Balance training    OT Goals(Current goals can be found in the care plan section) Acute Rehab OT Goals Patient Stated Goal: go  home OT Goal Formulation: With patient Time For Goal Achievement: 07/19/22 Potential to Achieve Goals: Good ADL Goals Pt Will Perform Lower Body Dressing: with modified independence;sit to/from stand Pt Will Transfer to Toilet: with modified independence;ambulating (LRAD, RLE PWB) Pt Will Perform Toileting - Clothing Manipulation and hygiene: with modified independence Additional ADL Goal #1: Pt will verbalize plan to implement at least 1 learned falls prevention strategy.  OT Frequency: Min 1X/week    Co-evaluation              AM-PAC OT "6 Clicks" Daily Activity     Outcome Measure Help from another person eating meals?: None Help from another person taking care of personal grooming?: None Help from another person toileting, which includes using toliet, bedpan, or urinal?: A Little Help from another person bathing (including washing, rinsing, drying)?: A Little Help from another person to put on and taking off regular upper body clothing?: None Help from another person to put on and taking off regular lower body clothing?: A Little 6 Click Score: 21   End of Session Equipment Utilized During Treatment: Rolling walker (2 wheels);Gait belt  Activity Tolerance: Patient tolerated treatment well  Patient left: in chair;with call bell/phone within reach;with chair alarm set  OT Visit Diagnosis: Other abnormalities of gait and mobility (R26.89);Muscle weakness (generalized) (M62.81);History of falling (Z91.81)                Time: 0955-1010 OT Time Calculation (min): 15 min Charges:  OT General Charges $OT Visit: 1 Visit OT Evaluation $OT Eval Low Complexity: 1 Low  Arman Filter., MPH, MS, OTR/L ascom [phone] 07/05/22, 1:26 PM

## 2022-07-05 NOTE — Progress Notes (Signed)
TOC assessment complete full note to follow  

## 2022-07-06 ENCOUNTER — Other Ambulatory Visit: Payer: Self-pay | Admitting: Infectious Diseases

## 2022-07-06 DIAGNOSIS — L089 Local infection of the skin and subcutaneous tissue, unspecified: Secondary | ICD-10-CM | POA: Diagnosis not present

## 2022-07-06 DIAGNOSIS — E11628 Type 2 diabetes mellitus with other skin complications: Secondary | ICD-10-CM | POA: Diagnosis not present

## 2022-07-06 DIAGNOSIS — L0390 Cellulitis, unspecified: Secondary | ICD-10-CM | POA: Diagnosis not present

## 2022-07-06 DIAGNOSIS — R7881 Bacteremia: Secondary | ICD-10-CM | POA: Diagnosis not present

## 2022-07-06 DIAGNOSIS — A419 Sepsis, unspecified organism: Secondary | ICD-10-CM | POA: Diagnosis not present

## 2022-07-06 DIAGNOSIS — L03115 Cellulitis of right lower limb: Secondary | ICD-10-CM | POA: Diagnosis not present

## 2022-07-06 DIAGNOSIS — D6852 Prothrombin gene mutation: Secondary | ICD-10-CM | POA: Diagnosis not present

## 2022-07-06 DIAGNOSIS — A4902 Methicillin resistant Staphylococcus aureus infection, unspecified site: Secondary | ICD-10-CM

## 2022-07-06 DIAGNOSIS — B9562 Methicillin resistant Staphylococcus aureus infection as the cause of diseases classified elsewhere: Secondary | ICD-10-CM | POA: Diagnosis not present

## 2022-07-06 LAB — GLUCOSE, CAPILLARY
Glucose-Capillary: 152 mg/dL — ABNORMAL HIGH (ref 70–99)
Glucose-Capillary: 289 mg/dL — ABNORMAL HIGH (ref 70–99)

## 2022-07-06 MED ORDER — ACETAMINOPHEN 325 MG PO TABS: 650.0000 mg | ORAL_TABLET | Freq: Four times a day (QID) | ORAL | Status: DC | PRN

## 2022-07-06 NOTE — Discharge Instructions (Addendum)
While on the antibiotic linezolid avoid or minimize following foods and drinks as they may interact with linezolid.  These foods and drinks contain tyramine.  Tyramine is a natural product found in some plants and animals.  Too much tyramine in combination with linezolid can cause high levels of serotonin in the body.  Serotonin is a chemical in our body that controls mood, sleep, digestion, and other functions.  Several signs of too much serotonin in the body (also called serotonin syndrome) are fast heart rate, sweating, fevers, high blood pressure, muscle twitching, and confusion.  It is important to seek medical attention if you have these symptoms.   Avoid foods and beverages that are high in tyramine while taking linezolid, including: Alcoholic beverages (such as beer, red wine, vermouth, sherry) Citrus or tropical fruits (oranges, grapefruit, lemon, lime, tangerine, ripe bananas, ripe pineapple, and ripe avocado) Aged cheeses (such as cheddar, blue cheese, swiss, feta, parmesan, camembert) Fermented or pickled foods (such as sauerkraut, pickled beets, pickled peppers, pickled cucumbers/pickles, kim chee/kimchi)  Dried/aged, smoked or processed meats and sausages (such as salami, pepperoni, liverwurst, hot dogs, bologna, bacon) Soybean products (such as soy sauce, tofu) Preserved fish (such as pickled herring) Products that contain large amounts of yeast (such as bouillon cubes, powdered soup/gravy, homemade or sourdough bread)  Broad/fava beans  Following foods are OK to eat while taking linezolid: Pasteurized cheeses (such as american, ricotta, cottage cheese, cream cheese) Vegetables (not fermented or pickled) Non-cured or smoked meats   

## 2022-07-06 NOTE — Discharge Summary (Addendum)
Physician Discharge Summary   Patient: Martin Mullen MRN: [id number] DOB: [birth year]  Admit date:     06/26/2022  Discharge date: 07/06/2022  Discharge Physician: Lurene Shadow   PCP: [name], NP   Recommendations at discharge:   Follow-up with Dr. Rivka Safer, ID specialist, on 07/29/2022 Follow-up with Dr. 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  He presented to the hospital because of pain in the right leg.     He was admitted to the hospital for sepsis secondary to MRSA bacteremia, right foot cellulitis,  acute osteomyelitis of right second metatarsal head and second toe proximal phalanx, septic arthritis  of right second MTP joint and right diabetic foot ulcer.  He was treated with empiric IV antibiotics.    Assessment and Plan:  Sepsis secondary to MRSA bacteremia, right foot cellulitis, acute osteomyelitis of right second metatarsal head and second toe proximal phalanx, septic arthritis of right second MTP joint, right diabetic foot ulcer: S/p washout I&D of right foot infection on 06/27/2022.  S/p stent placement to right popliteal artery on 06/29/2022.  S/p repeat I&D of right foot on 06/30/2022. TEE did not show any evidence of endocarditis.  ID recommended 3 weeks of Augmentin and Zyvox at discharge. Outpatient follow-up with ID and podiatrist strongly recommended.     Hypokalemia and hyponatremia: Improved     CAD with bare-metal stent to distal RCA in 2014, PVD: Continue aspirin, Lipitor     Heterozygous for prothrombin gene 20210A mutation, recurrent strokes: Continue Eliquis    Type II DM with hyperglycemia: Continue insulin glargine 16 units daily.  Hemoglobin A1c about a month prior to admission was 8.2.     Other comorbidities include hypertension, hyperlipidemia, history of Fournier's gangrene     His condition has improved and he is deemed stable for discharge to home today.       Consultants: Podiatrist, ID specialist, vascular surgeon Procedures performed: I&D of right foot, stent placement to right popliteal artery Disposition: Home health Diet recommendation:  Discharge Diet Orders (From admission, onward)     Start     Ordered   07/06/22 0000  Diet - low sodium heart healthy        07/06/22 1108  07/06/22 0000  Diet Carb Modified        07/06/22 1108           Cardiac and Carb modified diet DISCHARGE MEDICATION: Allergies as of 07/06/2022   No Known Allergies      Medication List     STOP taking these medications    CertaVite/Antioxidants Tabs   HYDROcodone-acetaminophen 5-325 MG tablet Commonly known as: NORCO/VICODIN       TAKE  these medications    Accu-Chek Guide test strip Generic drug: glucose blood Use to check blood sugar three times a day for type 2 diabetes E11.9   Accu-Chek Softclix Lancets lancets Use to check blood sugar three times a day for type 2 diabetes E11.9   acetaminophen 500 MG tablet Commonly known as: TYLENOL Take 2 tablets (1,000 mg total) by mouth every 6 (six) hours as needed.   amoxicillin-clavulanate 875-125 MG tablet Commonly known as: AUGMENTIN Take 1 tablet by mouth 2 (two) times daily for 21 days.   apixaban 5 MG Tabs tablet Commonly known as: ELIQUIS Take 1 tablet (5 mg total) by mouth 2 (two) times daily.   aspirin EC 81 MG tablet Take 1 tablet (81 mg total) by mouth daily.   buPROPion 150 MG 24 hr tablet Commonly known as: Wellbutrin XL Take 1 tablet (150 mg total) by mouth daily.   Lantus SoloStar 100 UNIT/ML Solostar Pen Generic drug: insulin glargine Inject 16 Units into the skin daily.   linezolid 600 MG tablet Commonly known as: ZYVOX Take 1 tablet (600 mg total) by mouth 2 (two) times daily for 21 days.   losartan 25 MG tablet Commonly known as: COZAAR Take 1 tablet (25 mg total) by mouth daily.   metFORMIN 1000 MG tablet Commonly known as: GLUCOPHAGE Take 1 tablet (1,000 mg total) by mouth 2 (two) times daily with a meal. TAKE 1 TABLET(1000 MG) BY MOUTH TWICE DAILY WITH A MEAL   nicotine 21 mg/24hr patch Commonly known as: NICODERM CQ - dosed in mg/24 hours Place 21 mg onto the skin daily.   Ozempic (0.25 or 0.5 MG/DOSE) 2 MG/3ML Sopn Generic drug: Semaglutide(0.25 or 0.5MG /DOS) Inject 0.5 mg into the skin once a week. Start with 0.25mg  weekly x 4 weeks then increase to 0.5mg  weekly injection.   rosuvastatin 20 MG tablet Commonly known as: CRESTOR Take 1 tablet (20 mg total) by mouth daily.   sildenafil 50 MG tablet Commonly known as: Viagra Take 1 tablet (50 mg total) by mouth daily as needed for erectile dysfunction.                Discharge Care Instructions  (From admission, onward)           Start     Ordered   07/06/22 0000  Discharge wound care:       Comments: Dressings changed.  Leave clean dry and intact   07/06/22 1108            Follow-up Information     Lynn Ito, MD. Go on 07/29/2022.   Specialty: Infectious Diseases Why: Appointment time at 11:45 am Contact information: [address] [place] [address] [phone]         Felecia Shelling, DPM. Schedule an appointment as soon as possible for a visit in 1 week(s).   Specialty: Podiatry Contact information: [address] [place] [address] [phone]                Discharge Exam: Ceasar Mons  Weights   06/26/22 1838 06/30/22 1116  Weight: 92.5 kg 92.5 kg   GEN: NAD SKIN: Warm and dry EYES: No pallor or icterus ENT: MMM CV: RRR PULM: CTA B ABD: soft, ND, NT, +BS CNS: AAO x 3, non focal EXT: Dressing on right foot is clean, dry and intact   Condition at discharge: good  The results of significant diagnostics from this hospitalization (including imaging, microbiology, ancillary and laboratory) are listed below for reference.   Imaging Studies: ECHO TEE  Result Date: 07/01/2022    TRANSESOPHOGEAL ECHO REPORT   Patient Name:   Martin Mullen Date of Exam: 07/01/2022 Medical Rec #:  [id number]       Height:       70.0 in Accession #:    [id number]      Weight:       203.9 lb Date of Birth:  01/21/1981       BSA:          2.105 m Patient Age:    42 years        BP:           107/80 mmHg Patient Gender: M               HR:           86 bpm. Exam Location:  [facility] Procedure: Transesophageal Echo, Cardiac Doppler, Color Doppler and Saline            Contrast Bubble Study Indications:     Bacteremia R78.81  History:         Patient has prior history of Echocardiogram examinations, most                  recent 06/28/2022. Stroke; Risk Factors:Diabetes. Tobacco use.  Sonographer:     Cristela Blue Referring  Phys:  [id number] Caryn Bee P PATEL Diagnosing Phys: Julien Nordmann MD PROCEDURE: After discussion of the risks and benefits of a TEE, an informed consent was obtained. TEE procedure time was 30 minutes. The transesophogeal probe was passed without difficulty through the esophogus of the patient. Imaged were obtained with the patient in a left lateral decubitus position. Local oropharyngeal anesthetic was provided with Cetacaine and viscous lidocaine. Sedation performed by different physician. Image quality was good. The patient's vital signs; including heart rate, blood pressure, and oxygen saturation; remained stable throughout the procedure. The patient developed no complications during the procedure.  IMPRESSIONS  1. Left ventricular ejection fraction, by estimation, is 60 to 65%. The left ventricle has normal function. The left ventricle has no regional wall motion abnormalities.  2. Right ventricular systolic function is normal. The right ventricular size is normal.  3. No left atrial/left atrial appendage thrombus was detected.  4. The mitral valve is normal in structure. Mild mitral valve regurgitation. No evidence of mitral stenosis.  5. The aortic valve is normal in structure. Aortic valve regurgitation is not visualized. Aortic valve sclerosis is present, calcification noted on NCC, unable to exclude mild prolapse. No evidence of aortic valve stenosis.  6. The inferior vena cava is normal in size with greater than 50% respiratory variability, suggesting right atrial pressure of 3 mmHg.  7. Agitated saline contrast bubble study was negative, with no evidence of any interatrial shunt. Conclusion(s)/Recommendation(s): Normal biventricular function without evidence of hemodynamically significant valvular heart disease. FINDINGS  Left Ventricle: Left ventricular ejection fraction, by estimation, is 60 to 65%. The left ventricle has normal function. The left ventricle has no regional  wall motion abnormalities. The  left ventricular internal cavity size was normal in size. There is  no left ventricular hypertrophy. Right Ventricle: The right ventricular size is normal. No increase in right ventricular wall thickness. Right ventricular systolic function is normal. Left Atrium: Left atrial size was normal in size. No left atrial/left atrial appendage thrombus was detected. Right Atrium: Right atrial size was normal in size. Pericardium: There is no evidence of pericardial effusion. Mitral Valve: The mitral valve is normal in structure. Mild mitral valve regurgitation. No evidence of mitral valve stenosis. There is no evidence of mitral valve vegetation. Tricuspid Valve: The tricuspid valve is normal in structure. Tricuspid valve regurgitation is trivial. No evidence of tricuspid stenosis. There is no evidence of tricuspid valve vegetation. Aortic Valve: The aortic valve is normal in structure. Aortic valve regurgitation is not visualized. Aortic valve sclerosis is present, with no evidence of aortic valve stenosis. There is no evidence of aortic valve vegetation. Pulmonic Valve: The pulmonic valve was normal in structure. Pulmonic valve regurgitation is not visualized. No evidence of pulmonic stenosis. There is no evidence of pulmonic valve vegetation. Aorta: The aortic root is normal in size and structure. Venous: The inferior vena cava is normal in size with greater than 50% respiratory variability, suggesting right atrial pressure of 3 mmHg. IAS/Shunts: No atrial level shunt detected by color flow Doppler. Agitated saline contrast was given intravenously to evaluate for intracardiac shunting. Agitated saline contrast bubble study was negative, with no evidence of any interatrial shunt. There  is no evidence of a patent foramen ovale. There is no evidence of an atrial septal defect. Julien Nordmann MD Electronically signed by Julien Nordmann MD Signature Date/Time: 07/01/2022/5:52:32 PM    Final (Updated)    ECHOCARDIOGRAM  COMPLETE  Result Date: 06/30/2022    ECHOCARDIOGRAM REPORT   Patient Name:   Martin Mullen Date of Exam: 06/28/2022 Medical Rec #:  [id number]       Height:       70.0 in Accession #:    [id number]      Weight:       203.9 lb Date of Birth:  Jan 10, 1981       BSA:          2.105 m Patient Age:    42 years        BP:           147/97 mmHg Patient Gender: M               HR:           103 bpm. Exam Location:  [facility] Procedure: 2D Echo, Cardiac Doppler and Color Doppler Indications:     R78.81 Bacteremia  History:         Patient has prior history of Echocardiogram examinations, most                  recent 09/12/2021. Previous Myocardial Infarction and CAD,                  Stroke; Risk Factors:Diabetes, Dyslipidemia and Current Smoker.  Sonographer:     Daphine Deutscher RDCS Referring Phys:  [id number] Lynn Ito Diagnosing Phys: Julien Nordmann MD IMPRESSIONS  1. Left ventricular ejection fraction, by estimation, is 60 to 65%. The left ventricle has normal function. The left ventricle has no regional wall motion abnormalities. Left ventricular diastolic parameters were normal.  2. Right ventricular systolic function is normal. The right ventricular size is normal.  3.  The mitral valve is normal in structure. Mild mitral valve regurgitation. No evidence of mitral stenosis.  4. The aortic valve is normal in structure. Aortic valve regurgitation is not visualized. No aortic stenosis is present.  5. The inferior vena cava is normal in size with greater than 50% respiratory variability, suggesting right atrial pressure of 3 mmHg.  6. No valve vegetation noted. FINDINGS  Left Ventricle: Left ventricular ejection fraction, by estimation, is 60 to 65%. The left ventricle has normal function. The left ventricle has no regional wall motion abnormalities. The left ventricular internal cavity size was normal in size. There is  no left ventricular hypertrophy. Left ventricular diastolic parameters were normal. Right  Ventricle: The right ventricular size is normal. No increase in right ventricular wall thickness. Right ventricular systolic function is normal. Left Atrium: Left atrial size was normal in size. Right Atrium: Right atrial size was normal in size. Pericardium: There is no evidence of pericardial effusion. Mitral Valve: The mitral valve is normal in structure. Mild mitral valve regurgitation. No evidence of mitral valve stenosis. Tricuspid Valve: The tricuspid valve is normal in structure. Tricuspid valve regurgitation is not demonstrated. No evidence of tricuspid stenosis. Aortic Valve: The aortic valve is normal in structure. Aortic valve regurgitation is not visualized. No aortic stenosis is present. Pulmonic Valve: The pulmonic valve was normal in structure. Pulmonic valve regurgitation is not visualized. No evidence of pulmonic stenosis. Aorta: The aortic root is normal in size and structure. Venous: The inferior vena cava is normal in size with greater than 50% respiratory variability, suggesting right atrial pressure of 3 mmHg. IAS/Shunts: The interatrial septum appears to be lipomatous. No atrial level shunt detected by color flow Doppler.  LEFT VENTRICLE PLAX 2D LVIDd:         3.50 cm   Diastology LVIDs:         2.40 cm   LV e' medial:    9.57 cm/s LV PW:         1.00 cm   LV E/e' medial:  11.5 LV IVS:        1.00 cm   LV e' lateral:   16.27 cm/s LVOT diam:     2.00 cm   LV E/e' lateral: 6.8 LV SV:         58 LV SV Index:   27 LVOT Area:     3.14 cm  RIGHT VENTRICLE             IVC RV Basal diam:  3.70 cm     IVC diam: 1.60 cm RV S prime:     16.67 cm/s TAPSE (M-mode): 2.8 cm LEFT ATRIUM             Index        RIGHT ATRIUM           Index LA diam:        4.70 cm 2.23 cm/m   RA Area:     16.50 cm LA Vol (A2C):   43.0 ml 20.43 ml/m  RA Volume:   47.80 ml  22.71 ml/m LA Vol (A4C):   33.4 ml 15.87 ml/m LA Biplane Vol: 39.1 ml 18.58 ml/m  AORTIC VALVE LVOT Vmax:   109.33 cm/s LVOT Vmean:  74.333 cm/s LVOT  VTI:    0.184 m  AORTA Ao Root diam: 4.00 cm MITRAL VALVE MV Area (PHT): 5.02 cm     SHUNTS MV Decel Time: 151 msec     Systemic VTI:  0.18 m MV E velocity: 110.00 cm/s  Systemic Diam: 2.00 cm MV A velocity: 88.40 cm/s MV E/A ratio:  1.24 Julien Nordmann MD Electronically signed by Julien Nordmann MD Signature Date/Time: 06/30/2022/8:05:27 AM    Final    PERIPHERAL VASCULAR CATHETERIZATION  Result Date: 06/29/2022 See surgical note for result.  MR FOOT RIGHT W WO CONTRAST  Result Date: 06/27/2022 CLINICAL DATA:  Foot swelling, diabetic, osteomyelitis suspected, xray done EXAM: MRI OF THE RIGHT FOREFOOT WITHOUT AND WITH CONTRAST TECHNIQUE: Multiplanar, multisequence MR imaging of the right forefoot was performed before and after the administration of intravenous contrast. CONTRAST:  9mL GADAVIST GADOBUTROL 1 MMOL/ML IV SOLN COMPARISON:  X-ray 06/26/2022 FINDINGS: Bones/Joint/Cartilage Bone marrow edema and enhancement with intermediate T1 marrow signal within the second metatarsal head and second toe proximal phalanx. Small-moderate second MTP joint effusion. Prominent bone marrow edema with intermediate T1 marrow signal within the base of the third toe proximal phalanx. There is also bone marrow edema and enhancement within the third metatarsal head. Small third MTP joint effusion. Remaining osseous structures are otherwise intact without significant marrow signal abnormality. No fracture or dislocation. Ligaments Intact Lisfranc ligament.  Intact collateral ligaments. Muscles and Tendons Flexor tenosynovitis of the second digit within the distal forefoot (series 9, images 16-17). There is also tenosynovitis of the second digit extensor tendon at the level of the second metatarsal neck. Denervation changes of the foot musculature. Soft tissues Plantar wound or ulceration underlying the second MTP joint. Extensive surrounding soft tissue edema. Poor enhancement of the soft tissues at this location is concerning  for tissue necrosis (series 13, images 15-17). No drainable fluid collection. IMPRESSION: 1. Plantar wound or ulceration underlying the second MTP joint. Extensive surrounding soft tissue edema compatible with cellulitis. Poor enhancement of the soft tissues at this location is concerning for tissue necrosis. No drainable fluid collection. 2. Acute osteomyelitis of the second metatarsal head and second toe proximal phalanx. Small-moderate second MTP joint effusion is suggestive of septic arthritis. 3. Acute osteomyelitis of the third metatarsal head and base of the third toe proximal phalanx. Small third MTP joint effusion, also concerning for septic arthritis. 4. Flexor tenosynovitis of the second digit within the distal forefoot. There is also tenosynovitis of the second digit extensor tendon at the level of the second metatarsal neck. Electronically Signed   By: Duanne Guess D.O.   On: 06/27/2022 08:25   DG Foot Complete Right  Result Date: 06/26/2022 CLINICAL DATA:  Second toe ulcer, initial encounter EXAM: RIGHT FOOT COMPLETE - 3+ VIEW COMPARISON:  None Available. FINDINGS: Soft tissue ulcer is noted near the second MTP joint. No erosive changes are identified to suggest osteomyelitis. Tiny densities are noted adjacent to the soft tissue wound. These may represent small foreign bodies. IMPRESSION: Soft tissue wound without bony erosive change. Question small foreign bodies within the wound. Electronically Signed   By: Alcide Clever M.D.   On: 06/26/2022 20:13   US Venous Img Lower Unilateral Right  Result Date: 06/26/2022 CLINICAL DATA:  Right lower extremity pain EXAM: RIGHT LOWER EXTREMITY VENOUS DOPPLER ULTRASOUND TECHNIQUE: Gray-scale sonography with compression, as well as color and duplex ultrasound, were performed to evaluate the deep venous system(s) from the level of the common femoral vein through the popliteal and proximal calf veins. COMPARISON:  None Available. FINDINGS: VENOUS Normal  compressibility of the common femoral, superficial femoral, and popliteal veins, as well as the visualized calf veins. Visualized portions of profunda femoral vein and great saphenous  vein unremarkable. No filling defects to suggest DVT on grayscale or color Doppler imaging. Doppler waveforms show normal direction of venous flow, normal respiratory plasticity and response to augmentation. Limited views of the contralateral common femoral vein are unremarkable. OTHER Several prominent lymph nodes in the right groin. Limitations: none IMPRESSION: 1.  Negative for DVT in the right lower extremity. 2.  Several nonspecific prominent lymph nodes in the right groin. Electronically Signed   By: Emmaline Kluver M.D.   On: 06/26/2022 19:53    Microbiology: Results for orders placed or performed during the hospital encounter of 06/26/22  Blood culture (single)     Status: Abnormal (Preliminary result)   Collection Time: 06/26/22  7:56 PM   Specimen: BLOOD  Result Value Ref Range Status   Specimen Description   Final    BLOOD RIGHT ANTECUBITAL Performed at [facility], [address], [place], [place] [address]    Special Requests   Final    BOTTLES DRAWN AEROBIC AND ANAEROBIC Blood Culture adequate volume Performed at [facility], [address] [place], [place] [address]    Culture  Setup Time   Final    GRAM POSITIVE COCCI AEROBIC BOTTLE ONLY CRITICAL RESULT CALLED TO, READ BACK BY AND VERIFIED WITH: DEVAN MITCHELL 06/27/2022 1124 CP    Culture (A)  Final    METHICILLIN RESISTANT STAPHYLOCOCCUS AUREUS Sent to [facility] for further susceptibility testing. Performed at [facility], [address] [place], [place] [address]    Report Status PENDING  Incomplete   Organism ID, Bacteria METHICILLIN RESISTANT STAPHYLOCOCCUS AUREUS  Final      Susceptibility   Methicillin resistant staphylococcus aureus - MIC*    CIPROFLOXACIN <=0.5 SENSITIVE Sensitive     ERYTHROMYCIN 0.5  SENSITIVE Sensitive     GENTAMICIN <=0.5 SENSITIVE Sensitive     OXACILLIN >=4 RESISTANT Resistant     TETRACYCLINE >=16 RESISTANT Resistant     VANCOMYCIN 1 SENSITIVE Sensitive     TRIMETH/SULFA <=10 SENSITIVE Sensitive     CLINDAMYCIN <=0.25 SENSITIVE Sensitive     RIFAMPIN <=0.5 SENSITIVE Sensitive     Inducible Clindamycin NEGATIVE Sensitive     LINEZOLID 2 SENSITIVE Sensitive     * METHICILLIN RESISTANT STAPHYLOCOCCUS AUREUS  Blood Culture ID Panel (Reflexed)     Status: Abnormal   Collection Time: 06/26/22  7:56 PM  Result Value Ref Range Status   Enterococcus faecalis NOT DETECTED NOT DETECTED Final   Enterococcus Faecium NOT DETECTED NOT DETECTED Final   Listeria monocytogenes NOT DETECTED NOT DETECTED Final   Staphylococcus species DETECTED (A) NOT DETECTED Final    Comment: CRITICAL RESULT CALLED TO, READ BACK BY AND VERIFIED WITH: DEVAN MITCHELL 06/27/2022 1124 CP    Staphylococcus aureus (BCID) DETECTED (A) NOT DETECTED Final    Comment: Methicillin (oxacillin)-resistant Staphylococcus aureus (MRSA). MRSA is predictably resistant to beta-lactam antibiotics (except ceftaroline). Preferred therapy is vancomycin unless clinically contraindicated. Patient requires contact precautions if  hospitalized. CRITICAL RESULT CALLED TO, READ BACK BY AND VERIFIED WITH: DEVAN MITCHELL 06/27/2022 1124 CP    Staphylococcus epidermidis NOT DETECTED NOT DETECTED Final   Staphylococcus lugdunensis NOT DETECTED NOT DETECTED Final   Streptococcus species NOT DETECTED NOT DETECTED Final   Streptococcus agalactiae NOT DETECTED NOT DETECTED Final   Streptococcus pneumoniae NOT DETECTED NOT DETECTED Final   Streptococcus pyogenes NOT DETECTED NOT DETECTED Final   A.calcoaceticus-baumannii NOT DETECTED NOT DETECTED Final   Bacteroides fragilis NOT DETECTED NOT DETECTED Final  Enterobacterales NOT DETECTED NOT DETECTED Final   Enterobacter cloacae complex NOT DETECTED NOT DETECTED Final    Escherichia coli NOT DETECTED NOT DETECTED Final   Klebsiella aerogenes NOT DETECTED NOT DETECTED Final   Klebsiella oxytoca NOT DETECTED NOT DETECTED Final   Klebsiella pneumoniae NOT DETECTED NOT DETECTED Final   Proteus species NOT DETECTED NOT DETECTED Final   Salmonella species NOT DETECTED NOT DETECTED Final   Serratia marcescens NOT DETECTED NOT DETECTED Final   Haemophilus influenzae NOT DETECTED NOT DETECTED Final   Neisseria meningitidis NOT DETECTED NOT DETECTED Final   Pseudomonas aeruginosa NOT DETECTED NOT DETECTED Final   Stenotrophomonas maltophilia NOT DETECTED NOT DETECTED Final   Candida albicans NOT DETECTED NOT DETECTED Final   Candida auris NOT DETECTED NOT DETECTED Final   Candida glabrata NOT DETECTED NOT DETECTED Final   Candida krusei NOT DETECTED NOT DETECTED Final   Candida parapsilosis NOT DETECTED NOT DETECTED Final   Candida tropicalis NOT DETECTED NOT DETECTED Final   Cryptococcus neoformans/gattii NOT DETECTED NOT DETECTED Final   Meth resistant mecA/C and MREJ DETECTED (A) NOT DETECTED Final    Comment: CRITICAL RESULT CALLED TO, READ BACK BY AND VERIFIED WITH: [facility] MITCHELL 06/27/2022 1124 CP Performed at [facility], [address] [place], [place] [address]   Blood culture (single)     Status: Abnormal   Collection Time: 06/26/22  8:04 PM   Specimen: BLOOD  Result Value Ref Range Status   Specimen Description   Final    BLOOD LEFT ANTECUBITAL Performed at [facility], [address] [place], [place] [address]    Special Requests   Final    BOTTLES DRAWN AEROBIC AND ANAEROBIC Blood Culture adequate volume Performed at [facility], [address] [place], [place] [address]    Culture  Setup Time   Final    GRAM POSITIVE COCCI AEROBIC BOTTLE ONLY Organism ID to follow CRITICAL RESULT CALLED TO, READ BACK BY AND VERIFIED WITH: NATHAN [email or web address]@[email or web address]  06/28/22 RH Performed at [facility], [address] [place], [place] [address]    Culture (A)  Final    STAPHYLOCOCCUS AUREUS SUSCEPTIBILITIES PERFORMED ON PREVIOUS CULTURE WITHIN THE LAST 5 DAYS. Performed at [facility], [address], [place], [place] [address]    Report Status 06/29/2022 FINAL  Final  Blood Culture ID Panel (Reflexed)     Status: Abnormal   Collection Time: 06/26/22  8:04 PM  Result Value Ref Range Status   Enterococcus faecalis NOT DETECTED NOT DETECTED Final   Enterococcus Faecium NOT DETECTED NOT DETECTED Final   Listeria monocytogenes NOT DETECTED NOT DETECTED Final   Staphylococcus species DETECTED (A) NOT DETECTED Final    Comment: CRITICAL RESULT CALLED TO, READ BACK BY AND VERIFIED WITH: NATHAN BLUE@[email or web address]  06/28/22 RH    Staphylococcus aureus (BCID) DETECTED (A) NOT DETECTED Final    Comment: Methicillin (oxacillin)-resistant Staphylococcus aureus (MRSA). MRSA is predictably resistant to beta-lactam antibiotics (except ceftaroline). Preferred therapy is vancomycin unless clinically contraindicated. Patient requires contact precautions if  hospitalized. CRITICAL RESULT CALLED TO, READ BACK BY AND VERIFIED WITH: NATHAN [email or web address]  06/28/22 RH    Staphylococcus epidermidis NOT DETECTED NOT DETECTED Final   Staphylococcus lugdunensis NOT DETECTED NOT DETECTED Final   Streptococcus species NOT DETECTED NOT DETECTED Final   Streptococcus agalactiae NOT DETECTED NOT DETECTED Final   Streptococcus pneumoniae NOT DETECTED NOT DETECTED Final   Streptococcus pyogenes NOT DETECTED NOT DETECTED Final   A.calcoaceticus-baumannii NOT DETECTED NOT  DETECTED Final   Bacteroides fragilis NOT DETECTED NOT DETECTED Final   Enterobacterales NOT DETECTED NOT DETECTED Final   Enterobacter cloacae complex NOT DETECTED NOT DETECTED Final   Escherichia coli NOT DETECTED NOT DETECTED Final   Klebsiella aerogenes NOT DETECTED NOT DETECTED Final   Klebsiella oxytoca NOT DETECTED NOT DETECTED Final   Klebsiella pneumoniae NOT  DETECTED NOT DETECTED Final   Proteus species NOT DETECTED NOT DETECTED Final   Salmonella species NOT DETECTED NOT DETECTED Final   Serratia marcescens NOT DETECTED NOT DETECTED Final   Haemophilus influenzae NOT DETECTED NOT DETECTED Final   Neisseria meningitidis NOT DETECTED NOT DETECTED Final   Pseudomonas aeruginosa NOT DETECTED NOT DETECTED Final   Stenotrophomonas maltophilia NOT DETECTED NOT DETECTED Final   Candida albicans NOT DETECTED NOT DETECTED Final   Candida auris NOT DETECTED NOT DETECTED Final   Candida glabrata NOT DETECTED NOT DETECTED Final   Candida krusei NOT DETECTED NOT DETECTED Final   Candida parapsilosis NOT DETECTED NOT DETECTED Final   Candida tropicalis NOT DETECTED NOT DETECTED Final   Cryptococcus neoformans/gattii NOT DETECTED NOT DETECTED Final   Meth resistant mecA/C and MREJ DETECTED (A) NOT DETECTED Corrected    Comment: CRITICAL RESULT CALLED TO, READ BACK BY AND VERIFIED WITH: NATHAN BLUE@[email or web address]  06/28/22 RH Performed at [facility], [address] [place], [place] [address] CORRECTED ON 05/14 AT 0802: PREVIOUSLY REPORTED AS DETECTED NATHAN BLUE@[email or web address]  06/28/22 RH   Aerobic/Anaerobic Culture w Gram Stain (surgical/deep wound)     Status: None   Collection Time: 06/27/22  9:09 AM   Specimen: Wound; Tissue  Result Value Ref Range Status   Specimen Description   Final    BONE Performed at [facility], [address] [place], [place] [address]    Special Requests   Final    NONE Performed at [facility], [address] [place], [place] [address]    Gram Stain NO WBC SEEN NO ORGANISMS SEEN   Final   Culture   Final    FEW METHICILLIN RESISTANT STAPHYLOCOCCUS AUREUS FEW ESCHERICHIA COLI RARE VIRIDANS STREPTOCOCCUS RARE CORYNEBACTERIUM SPECIES Standardized susceptibility testing for this organism is not available. NO ANAEROBES ISOLATED Performed at [facility], [address], [place], [place] [address]     Report Status 07/02/2022 FINAL  Final   Organism ID, Bacteria METHICILLIN RESISTANT STAPHYLOCOCCUS AUREUS  Final   Organism ID, Bacteria ESCHERICHIA COLI  Final      Susceptibility   Escherichia coli - MIC*    AMPICILLIN <=2 SENSITIVE Sensitive     CEFEPIME <=0.12 SENSITIVE Sensitive     CEFTAZIDIME <=1 SENSITIVE Sensitive     CEFTRIAXONE <=0.25 SENSITIVE Sensitive     CIPROFLOXACIN <=0.25 SENSITIVE Sensitive     GENTAMICIN <=1 SENSITIVE Sensitive     IMIPENEM <=0.25 SENSITIVE Sensitive     TRIMETH/SULFA <=20 SENSITIVE Sensitive     AMPICILLIN/SULBACTAM <=2 SENSITIVE Sensitive     PIP/TAZO <=4 SENSITIVE Sensitive     * FEW ESCHERICHIA COLI   Methicillin resistant staphylococcus aureus - MIC*    CIPROFLOXACIN <=0.5 SENSITIVE Sensitive     ERYTHROMYCIN 0.5 SENSITIVE Sensitive     GENTAMICIN <=0.5 SENSITIVE Sensitive     OXACILLIN >=4 RESISTANT Resistant     TETRACYCLINE >=16 RESISTANT Resistant     VANCOMYCIN <=0.5 SENSITIVE Sensitive     TRIMETH/SULFA <=10 SENSITIVE Sensitive     CLINDAMYCIN <=0.25 SENSITIVE Sensitive     RIFAMPIN <=0.5 SENSITIVE Sensitive  Inducible Clindamycin NEGATIVE Sensitive     LINEZOLID 2 SENSITIVE Sensitive     * FEW METHICILLIN RESISTANT STAPHYLOCOCCUS AUREUS  Culture, blood (Routine X 2) w Reflex to ID Panel     Status: None   Collection Time: 06/29/22  2:50 AM   Specimen: BLOOD  Result Value Ref Range Status   Specimen Description BLOOD RIGHT HAND  Final   Special Requests   Final    BOTTLES DRAWN AEROBIC AND ANAEROBIC Blood Culture adequate volume   Culture   Final    NO GROWTH 5 DAYS Performed at [facility], [address] [place], [place] [address]    Report Status 07/04/2022 FINAL  Final  Culture, blood (Routine X 2) w Reflex to ID Panel     Status: None   Collection Time: 06/29/22  2:50 AM   Specimen: BLOOD  Result Value Ref Range Status   Specimen Description BLOOD RIGHT ARM  Final   Special Requests   Final    BOTTLES  DRAWN AEROBIC AND ANAEROBIC Blood Culture adequate volume   Culture   Final    NO GROWTH 5 DAYS Performed at [facility], [address] [place], [place] [address]    Report Status 07/04/2022 FINAL  Final    Labs: CBC: Recent Labs  Lab 06/30/22 0203 07/01/22 0512 07/02/22 0446 07/03/22 0435 07/04/22 0554  WBC 15.0* 20.3* 13.9* 11.7* 12.4*  HGB 13.4 13.3 12.3* 12.4* 13.3  HCT 39.2 37.8* 36.4* 36.3* 39.3  MCV 88.3 88.7 89.9 89.4 89.7  PLT 283 337 322 362 399   Basic Metabolic Panel: Recent Labs  Lab 06/30/22 0203 07/01/22 0512 07/02/22 0446 07/03/22 0435 07/04/22 0554 07/05/22 0503  NA 131* 134* 136 135 136  --   K 4.1 4.4 3.9 3.9 4.0  --   CL 98 102 104 103 102  --   CO2 23 23 25 26 25   --   GLUCOSE 198* 277* 268* 164* 158*  --   BUN 9 14 13 10 10   --   CREATININE 0.82 0.78 0.78 0.75 0.70 0.69  CALCIUM 8.4* 8.6* 8.3* 8.4* 9.0  --    Liver Function Tests: No results for input(s): "AST", "ALT", "ALKPHOS", "BILITOT", "PROT", "ALBUMIN" in the last 168 hours. CBG: Recent Labs  Lab 07/05/22 0815 07/05/22 1241 07/05/22 1705 07/05/22 2234 07/06/22 0802  GLUCAP 166* 159* 184* 207* 152*    Discharge time spent: greater than 30 minutes.  Signed: Lurene Shadow, MD [facility] 07/06/2022

## 2022-07-06 NOTE — Progress Notes (Signed)
Physical Therapy Treatment Patient Details Name: Martin Mullen MRN: [id number] DOB: 08-09-1980 Today's Date: 07/06/2022   History of Present Illness 42 y.o. 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  He presented to the hospital because of pain in the right leg. He was admitted to the hospital for sepsis secondary to MRSA bacteremia, right foot cellulitis,  acute osteomyelitis of right second metatarsal head and second toe proximal phalanx, septic arthritis of right second MTP joint and right diabetic foot ulcer. 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    PT Comments    Pt wakes to voice, agreeable to mobility, denied pain throughout. Supine <> sit modI, extended time and effortful and use of bed rails noted. Pt able to don post op shoe and sneaker at EOB without assistance (educated on technique to improve ease but no assistance needed). Sit <> stand with RW and supervision, and he ambulated ~9ft, CGA. Some improvement in steadiness noted with post op shoe vs cam boot, pt educated on limiting toe weight bearing, as well as how to improve safety with turning/pivoting with RW. Pt/PT also verbally reviewed stairs, as well as ensuring supervision/assistance at discharge for safety. The patient would benefit from further skilled PT intervention to continue to progress towards goals.     Recommendations for follow up therapy are one component of a multi-disciplinary discharge planning process, led by the attending physician.  Recommendations may be updated based on patient status, additional functional criteria and insurance authorization.  Follow Up Recommendations       Assistance Recommended at Discharge Frequent or  constant Supervision/Assistance  Patient can return home with the following A little help with walking and/or transfers;Assistance with cooking/housework;Direct supervision/assist for medications management;Assist for transportation;Help with stairs or ramp for entrance;A little help with bathing/dressing/bathroom   Equipment Recommendations  Rolling walker (2 wheels);BSC/3in1    Recommendations for Other Services       Precautions / Restrictions Precautions Precautions: Fall Restrictions Weight Bearing Restrictions: Yes RLE Weight Bearing: Partial weight bearing Other Position/Activity Restrictions: per orders cam boot for short distances with partial weighting in heel     Mobility  Bed Mobility Overal bed mobility: Modified Independent             General bed mobility comments: very effortful but no physical assistance needed, use of bed rails    Transfers Overall transfer level: Needs assistance Equipment used: Rolling walker (2 wheels) Transfers: Sit to/from Stand Sit to Stand: Supervision                Ambulation/Gait Ambulation/Gait assistance: Min guard Gait Distance (Feet): 50 Feet Assistive device: Rolling walker (2 wheels)         General Gait Details: some improvement in stability with post op shoe, but with turns pt lifts RW or pivots quickly, unsteadiness present.   Stairs             Wheelchair Mobility    Modified Rankin (Stroke Patients Only)       Balance Overall balance assessment: Needs assistance Sitting-balance support: Feet supported Sitting balance-Leahy Scale: Normal Sitting balance - Comments: able to don both post op shoe and sneaker without assistance   Standing balance support: Reliant on assistive device for balance Standing balance-Leahy Scale: Fair  Cognition Arousal/Alertness: Awake/alert Behavior During Therapy: WFL for tasks assessed/performed Overall Cognitive  Status: Within Functional Limits for tasks assessed                                          Exercises      General Comments        Pertinent Vitals/Pain Pain Assessment Pain Assessment: No/denies pain    Home Living                          Prior Function            PT Goals (current goals can now be found in the care plan section) Progress towards PT goals: Progressing toward goals    Frequency    Min 3X/week      PT Plan Current plan remains appropriate    Co-evaluation              AM-PAC PT "6 Clicks" Mobility   Outcome Measure  Help needed turning from your back to your side while in a flat bed without using bedrails?: A Little Help needed moving from lying on your back to sitting on the side of a flat bed without using bedrails?: A Little Help needed moving to and from a bed to a chair (including a wheelchair)?: A Little Help needed standing up from a chair using your arms (e.g., wheelchair or bedside chair)?: A Little Help needed to walk in hospital room?: A Little Help needed climbing 3-5 steps with a railing? : A Little 6 Click Score: 18    End of Session Equipment Utilized During Treatment: Gait belt Activity Tolerance: Patient tolerated treatment well Patient left: in bed;with call bell/phone within reach;with bed alarm set Nurse Communication: Mobility status PT Visit Diagnosis: Other abnormalities of gait and mobility (R26.89);Muscle weakness (generalized) (M62.81);Difficulty in walking, not elsewhere classified (R26.2)     Time: 0347-4259 PT Time Calculation (min) (ACUTE ONLY): 16 min  Charges:  $Therapeutic Activity: 8-22 mins                     Olga Coaster PT, DPT 11:25 AM,07/06/22

## 2022-07-06 NOTE — TOC Initial Note (Signed)
Transition of Care [facility]) - Initial/Assessment Note    Patient Details  Name: Martin Mullen MRN: [id number] Date of Birth: 09/25/1980  Transition of Care [facility]) CM/SW Contact:    Chapman Fitch, RN Phone Number: 07/06/2022, 3:03 PM  Clinical Narrative:                  Admitted for: s/p 2nd ray amputation Admitted from: roommate  PCP: baity  Current home health/prior home health/DME: [facility]  Therapy recommending home health.  Patient in agreement, and does not have preference of agency referral accepted by [facility] with [place] [facility].  Patient with history of stroke and slow to respond to questions.  Spoke with tabitha HPOA.  She confirms that patient lives at home with a roommate.  Roommate to provide transportation  Currently no dressing changes and patient to follow up with podiatry.  Tabithia confirms she will be able to assist with dressing changes if needed RW delivered to room by adapt          Patient Goals and CMS Choice            Expected Discharge Plan and Services         Expected Discharge Date: 07/06/22                                    Prior Living Arrangements/Services                       Activities of Daily Living Home Assistive Devices/Equipment: None ADL Screening (condition at time of admission) Patient's cognitive ability adequate to safely complete daily activities?: Yes Is the patient deaf or have difficulty hearing?: No Does the patient have difficulty seeing, even when wearing glasses/contacts?: No Does the patient have difficulty concentrating, remembering, or making decisions?: No Patient able to express need for assistance with ADLs?: Yes Does the patient have difficulty dressing or bathing?: No Independently performs ADLs?: Yes (appropriate for developmental age) Does the patient have difficulty walking or climbing stairs?: No Weakness of Legs: None Weakness of Arms/Hands: None  Permission Sought/Granted                   Emotional Assessment              Admission diagnosis:  Cellulitis [L03.90] Sepsis due to cellulitis (HCC) [L03.90, A41.9] Patient Active Problem List   Diagnosis Date Noted   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) 06/30/2022   Open wound 06/30/2022   Ulcerated, foot, left, with fat layer exposed (HCC) 06/30/2022   Diabetic infection of right foot (HCC) 06/29/2022   MRSA bacteremia 06/28/2022   Plantar ulcer of right foot, with fat layer exposed (HCC) 06/27/2022   PAD (peripheral artery disease) (HCC) 06/27/2022   Hypokalemia 06/27/2022   Cellulitis of right foot 06/26/2022   History of Fournier's gangrene 06/26/2022   Chronic anticoagulation 06/26/2022   Diabetic foot ulcer associated with type 2 diabetes mellitus (HCC) 06/26/2022   Stroke (HCC) 09/11/2021   Overweight with body mass index (BMI) of 29 to 29.9 in adult 10/08/2020   Recurrent strokes (HCC) 08/27/2020   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) 03/25/2020   Hyponatremia 07/20/2019   GERD (gastroesophageal reflux disease) 07/20/2019   Sepsis due to cellulitis (HCC) 03/18/2017   CAD S/P percutaneous coronary angioplasty    Uncontrolled type 2 diabetes mellitus with hyperglycemia, with long-term current  use of insulin (HCC) 07/09/2013   STEMI s/p RCA stent 08/2012 09/05/2012   HTN (hypertension) 09/05/2012   Hyperlipidemia associated with type 2 diabetes mellitus (HCC) 09/05/2012   Adjustment disorder with mixed anxiety and depressed mood 09/05/2012   PCP:  [name], NP Pharmacy:   [id number] [place], [place] - [address] AT [facility] [address] [place] [address] Phone: [phone] Fax: [phone]  [facility] [address] [place] [address] Phone: [phone] Fax: [phone]  [facility] [address] [place]  [address] Phone: [phone] Fax: [phone]     Social Determinants of Health (SDOH) Social History: SDOH Screenings   Food Insecurity: No Food Insecurity (06/29/2022)  Housing: Low Risk  (06/29/2022)  Transportation Needs: No Transportation Needs (06/29/2022)  Utilities: Not At Risk (06/29/2022)  Alcohol Screen: Low Risk  (07/01/2020)  Depression (PHQ2-9): High Risk (05/10/2022)  Financial Resource Strain: Low Risk  (06/10/2022)  Tobacco Use: High Risk (07/03/2022)   SDOH Interventions:     Readmission Risk Interventions     No data to display

## 2022-07-06 NOTE — Progress Notes (Signed)
Date of Admission:  06/26/2022     ID: Martin Mullen is a 42 y.o. 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)   Recurrent strokes (HCC)   Cellulitis of right foot   History of Fournier's gangrene   Chronic anticoagulation   Diabetic foot ulcer associated with type 2 diabetes mellitus (HCC)   Plantar ulcer of right foot, with fat layer exposed (HCC)   PAD (peripheral artery disease) (HCC)   Hypokalemia   MRSA bacteremia   Diabetic infection of right foot (HCC)   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)   Open wound   Ulcerated, foot, left, with fat layer exposed (HCC)    Subjective: Pt in bed Says he is feeling better  Medications:   apixaban  5 mg Oral BID   aspirin EC  81 mg Oral Daily   buPROPion  150 mg Oral Daily   butamben-tetracaine-benzocaine  1 spray Topical Once   insulin aspart  0-15 Units Subcutaneous TID WC   insulin aspart  0-5 Units Subcutaneous QHS   insulin glargine-yfgn  16 Units Subcutaneous Daily   lidocaine  15 mL Mouth/Throat Once   losartan  25 mg Oral Daily   nicotine  21 mg Transdermal Daily   rosuvastatin  20 mg Oral Daily    Objective: Vital signs in last 24 hours: Patient Vitals for the past 24 hrs:  BP Temp Temp src Pulse Resp SpO2  07/06/22 0500 (!) 140/90 98.8 F (37.1 C) -- 88 18 97 %  07/05/22 1940 131/82 97.9 F (36.6 C) -- 94 16 98 %  07/05/22 1726 131/84 97.7 F (36.5 C) Oral 97 16 97 %      PHYSICAL EXAM:  General: Alert, cooperative,slow speech,  Lungs: Clear to auscultation bilaterally. No Wheezing or Rhonchi. No rales. Heart: Regular rate and rhythm, no murmur, rub or gallop. Abdomen: Soft,  non-tender,not distended. Bowel sounds normal. No masses Extremities: rt foot- surgical site looks well approximated- no discharge or erythema- 2nd toe amputation         Skin: No rashes or lesions. Or bruising Lymph: Cervical, supraclavicular normal. Neurologic: Minimal  weakness left side ( lower extremity)  Lab Results    Latest Ref Rng & Units 07/04/2022    5:54 AM 07/03/2022    4:35 AM 07/02/2022    4:46 AM  CBC  WBC 4.0 - 10.5 K/uL 12.4  11.7  13.9   Hemoglobin 13.0 - 17.0 g/dL 40.9  81.1  91.4   Hematocrit 39.0 - 52.0 % 39.3  36.3  36.4   Platelets 150 - 400 K/uL 399  362  322        Latest Ref Rng & Units 07/05/2022    5:03 AM 07/04/2022    5:54 AM 07/03/2022    4:35 AM  CMP  Glucose 70 - 99 mg/dL  782  956   BUN 6 - 20 mg/dL  10  10   Creatinine 2.13 - 1.24 mg/dL 0.86  5.78  4.69   Sodium 135 - 145 mmol/L  136  135   Potassium 3.5 - 5.1 mmol/L  4.0  3.9   Chloride 98 - 111 mmol/L  102  103   CO2 22 - 32 mmol/L  25  26   Calcium 8.9 - 10.3 mg/dL  9.0  8.4       Microbiology: Blood culture 06/26/22 2 out of 4 MRSA 06/29/22 B 2 sets NG  Wound cultures MRSA and Ecoli    Assessment/Plan:  ?MRSA bacteremia- source is the foot He is currently on vancomycin  repeat blood cultures 06/29/22 no growth TEE no endocarditis   Rt foot infection- 2nd toe abscess/osteo S/p I/D , 2nd toe ray excision Culture MRSA and ecoli Pathology acute osteo extending to proximal margin of 2 nd toe 3d toe biopsy no osteo As no endocarditis, will give  10 days of Iv vanco, ( 07/06/22) and then switch to  PO linezolid/augmentin for 3 weeks . 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 Discussed the management with the patient in great detail and care team

## 2022-07-07 ENCOUNTER — Other Ambulatory Visit: Payer: Self-pay | Admitting: Infectious Diseases

## 2022-07-07 DIAGNOSIS — A4902 Methicillin resistant Staphylococcus aureus infection, unspecified site: Secondary | ICD-10-CM

## 2022-07-07 LAB — MISC LABCORP TEST (SEND OUT)

## 2022-07-08 LAB — MIC RESULT

## 2022-07-10 LAB — MINIMUM INHIBITORY CONC. (1 DRUG)

## 2022-07-11 LAB — CULTURE, BLOOD (SINGLE): Special Requests: ADEQUATE

## 2022-07-12 ENCOUNTER — Ambulatory Visit: Payer: Medicaid Other | Admitting: Podiatry

## 2022-07-13 ENCOUNTER — Telehealth: Payer: Self-pay | Admitting: Podiatry

## 2022-07-13 ENCOUNTER — Telehealth: Payer: Self-pay | Admitting: Pharmacist

## 2022-07-13 ENCOUNTER — Ambulatory Visit: Payer: Medicaid Other | Admitting: Pharmacist

## 2022-07-13 DIAGNOSIS — I10 Essential (primary) hypertension: Secondary | ICD-10-CM

## 2022-07-13 DIAGNOSIS — E1169 Type 2 diabetes mellitus with other specified complication: Secondary | ICD-10-CM

## 2022-07-13 NOTE — Progress Notes (Signed)
07/13/2022 Name: Martin Mullen MRN: [id number] DOB: 05-24-1980  Chief Complaint  Patient presents with   Medication Management   Medication Adherence    Martin Mullen is a 42 y.o. year old male who presented for a telephone visit.   They were referred to the pharmacist by their PCP for assistance in managing diabetes and medication access.    Patient is participating in a Managed Medicaid Plan:  Sharilyn Sites Managed Medicaid    Subjective:   Care Team: Primary Care Provider: [name], NP ; Next Scheduled Visit: 08/11/2022 Managed Medicaid RN: Danie Chandler, RN Managed Medicaid Social Worker: Gus Puma, [place], [place] Podiatry: Candelaria Stagers, DPM Infectious Disease Specialist: Lynn Ito, MD; Next Scheduled Visit: 07/19/2022  Medication Access/Adherence  Current Pharmacy:  [id number] Nicholes Rough, [place] [address] [place] AT [facility] [address] [place] [address] Phone: [phone] Fax: [phone]  [facility] [address] [place] [address] Phone: [phone] Fax: [phone]  [facility] [address] [place] [address] Phone: [phone] Fax: [phone]   Patient reports affordability concerns with their medications: No  Patient reports access/transportation concerns to their pharmacy: No  Patient reports adherence concerns with their medications:  No    Patient using a weekly pillbox to organize his medications  - Reports improved medication adherence since started using this tool  From review of chart, note patient admitted to [facility] 5/12 to 07/06/2022 related to sepsis due to cellulitis. Discharging provider recommended: - Follow-up with Dr. Rivka Safer, ID specialist, on 07/29/2022 - Follow-up with Dr. Kipp Brood events, podiatrist, in 1 week - Follow-up with PCP in 1 to 2 weeks - Patient  to STOP:  CertaVite/Antioxidants Tabs HYDROcodone-acetaminophen 5-325 MG tablet - Patient to START:  amoxicillin-clavulanate 875-125 MG two times daily for 21 days  linezolid 600 MG tablet two times daily for 21 days   Today patient reports taking both Augmentin and linezolid antibiotics as directed  From review of chart, note patient missed appointment with Podiatry yesterday    Diabetes:   Current medications:  - metformin 1000 mg twice daily - Lantus 16 units daily - Ozempic 0.5 mg weekly on Fridays (restarted 5/24)   Medications tried in the past/contraindications: glipizide, Januvia; note would avoid SGLT2-inhibitor as patient with history of Fournier gangrene   Reports recent home glucose readings ranging 153-182   Patient denies hypoglycemic s/sx including dizziness, shakiness, sweating   Reports has maintained reduced sugary beverage consumption; but still drinking 2 x 28 oz bottles of Gatorade/day, otherwise drinking water.    Patient notes he has limited ability to chew as has top dentures, but no bottom             Reports plans to work with insurance to obtain bottom dentures       Hyperlipidemia/ASCVD Risk Reduction   Current lipid lowering medications: rosuvastatin 20 mg daily   Antiplatelet regimen: aspirin 81 mg (note patient also on Eliquis 5 mg twice daily)   ASCVD History: medical history significant of STEMI s/p RCA stent 2014, hypertension, recurrent strokes, heterozygous prothrombin gene mutation on Eliquis, insulin-dependent type 2 diabetes  Risk Factors: tobacco use, uncontrolled T2DM   Hypertension:  Current medications: none  From review of chart, note at Office Visit on 05/10/2022, PCP advised patient to start losartan 25 mg daily for blood pressure control - Today  patient denies having picked up/starting this medication   Objective:  Lab Results  Component Value Date   HGBA1C 8.2 (A) 05/10/2022    Lab Results  Component Value Date    CREATININE 0.69 07/05/2022   BUN 10 07/04/2022   NA 136 07/04/2022   K 4.0 07/04/2022   CL 102 07/04/2022   CO2 25 07/04/2022    Lab Results  Component Value Date   CHOL 141 05/10/2022   HDL 36 (L) 05/10/2022   LDLCALC 83 05/10/2022   LDLDIRECT 126.0 06/20/2017   TRIG 128 05/10/2022   CHOLHDL 3.9 05/10/2022   BP Readings from Last 3 Encounters:  07/06/22 (!) 140/90  07/01/22 107/80  06/07/22 125/82   Pulse Readings from Last 3 Encounters:  07/06/22 88  07/01/22 86  06/07/22 (!) 103     Medications Reviewed Today     Reviewed by [name], MD (Physician) on 07/01/22 at 1240  Med List Status: Complete   Medication Order Taking? Sig Documenting Provider Last Dose Status Informant  Accu-Chek Softclix Lancets lancets [id number]  Use to check blood sugar three times a day for type 2 diabetes E11.9 [name], NP  Active Self  acetaminophen (TYLENOL) 500 MG tablet [id number] Yes Take 2 tablets (1,000 mg total) by mouth every 6 (six) hours as needed. Varney Daily, PA prn Active Self  apixaban (ELIQUIS) 5 MG TABS tablet [id number] Yes Take 1 tablet (5 mg total) by mouth 2 (two) times daily. [name], NP 06/24/2022 1300 Active Self  aspirin EC 81 MG tablet [id number] Yes Take 1 tablet (81 mg total) by mouth daily. [name], NP 06/25/2022 Active Self  buPROPion (WELLBUTRIN XL) 150 MG 24 hr tablet [id number] Yes Take 1 tablet (150 mg total) by mouth daily. [name], NP 06/25/2022 Active Self  glucose blood (ACCU-CHEK GUIDE) test strip [id number]  Use to check blood sugar three times a day for type 2 diabetes E11.9 [name], NP  Active Self  HYDROcodone-acetaminophen (NORCO/VICODIN) 5-325 MG tablet [id number] No Take 1 tablet by mouth every 4 (four) hours as needed for moderate pain.  Patient not taking: Reported on 06/26/2022   CuthriellDelorise Royals, PA-C Not Taking Active Self  insulin glargine (LANTUS SOLOSTAR) 100 UNIT/ML Solostar Pen [id number] Yes  Inject 16 Units into the skin daily. [name], NP 06/25/2022 Active Self  losartan (COZAAR) 25 MG tablet [id number] Yes Take 1 tablet (25 mg total) by mouth daily. [name], NP 06/25/2022 Active Self  metFORMIN (GLUCOPHAGE) 1000 MG tablet [id number] Yes Take 1 tablet (1,000 mg total) by mouth 2 (two) times daily with a meal. TAKE 1 TABLET(1000 MG) BY MOUTH TWICE DAILY WITH A MEAL [name], NP 06/25/2022 Active Self  Multiple Vitamin (MULTIVITAMIN WITH MINERALS) TABS tablet [id number] No Take 1 tablet by mouth daily. One-A-Day Multivitamin  Patient not taking: Reported on 06/26/2022   [name], Leana Roe, MD Not Taking Active Self  nicotine (NICODERM CQ - DOSED IN MG/24 HOURS) 21 mg/24hr patch [id number]  Place 21 mg onto the skin daily. [provider]  Active Self           Med Note Laurell Roof Jun 26, 2022 10:46 PM) For in hospital use.  OZEMPIC, 0.25 OR 0.5 MG/DOSE, 2 MG/3ML SOPN [id number] Yes Inject 0.5 mg into the skin once a week. Start with 0.25mg  weekly x 4 weeks then increase to 0.5mg  weekly injection. [name], NP  06/24/2022 Active Self  rosuvastatin (CRESTOR) 20 MG tablet [id number] Yes Take 1 tablet (20 mg total) by mouth daily. [name], NP 06/25/2022 Active Self  sildenafil (VIAGRA) 50 MG tablet [id number] Yes Take 1 tablet (50 mg total) by mouth daily as needed for erectile dysfunction. [name], NP prn Active Self              Assessment/Plan:   Advise patient to contact [facility] today to reschedule missed appointment from yesterday  Provide patient with phone number  Comprehensive medication review performed; medication list updated in electronic medical record - Identify patient in need of renewal of Eliquis, metformin and rosuvastatin prescriptions as has <1 week supply remaining and current prescriptions out of refills  Send request for renewal to clinical team  Advise patient to contact PCP office to  schedule post-hospitalization visit with provider  Counsel patient on importance of taking and completing both antibiotic courses as directed   Diabetes: - Currently uncontrolled - Reviewed dietary modifications including working toward having regular well-balanced meals spread throughout the day, while controlling carbohydrate portion sizes             Encourage patient to continue to cut back on consumption of sugary beverages             Discuss strategies to increase intake of non-starchy vegetables, such as broccoli - Have counseled patient on s/s of low blood sugar and how to treat lows Review rules of 15s - importance of using 15 grams of sugar to treat low, recheck blood sugar in 15 minutes, treat again if remains low or, if back to normal, having meal if mealtime or snack  Encourage patient to pick up and carry glucose tablets - Recommend to continue checking blood glucose, keep log of results, have this record to review during next appointment, but to contact office sooner for readings outside of established parameters of for symptoms   Hypertension: - Recommend to pick up losartan prescription and start taking as directed by PCP        Follow Up Plan: Clinical Pharmacist will follow up with patient by telephone again on 07/25/2022 at 2:30 PM    Estelle Grumbles, PharmD, [facility] Clinical Pharmacist [facility] [phone]

## 2022-07-13 NOTE — Patient Instructions (Signed)
Goals Addressed             This Visit's Progress    Pharmacy Goals       Our goal A1c is less than 7%. This corresponds with fasting sugars less than 130 and 2 hour after meal sugars less than 180. Please keep a log of your results when checking your blood sugar   Our goal bad cholesterol, or LDL, is less than 70 . This is why it is important to continue taking your rosuvastatin.  Please follow up with your pharmacy to pick up and start taking losartan 25 mg daily as prescribed by your primary care provider for blood pressure control.  Check your blood pressure twice weekly, and any time you have concerning symptoms like headache, chest pain, dizziness, shortness of breath, or vision changes.   Our goal is less than 130/80.  To appropriately check your blood pressure, make sure you do the following:  1) Avoid caffeine, exercise, or tobacco products for 30 minutes before checking. Empty your bladder. 2) Sit with your back supported in a flat-backed chair. Rest your arm on something flat (arm of the chair, table, etc). 3) Sit still with your feet flat on the floor, resting, for at least 5 minutes.  4) Check your blood pressure. Take 1-2 readings.  5) Write down these readings and bring with you to any provider appointments.  Bring your home blood pressure machine with you to a provider's office for accuracy comparison at least once a year.   Make sure you take your blood pressure medications before you come to any office visit, even if you were asked to fast for labs.   Estelle Grumbles, PharmD, [facility] Clinical Pharmacist [facility] [phone]

## 2022-07-13 NOTE — Telephone Encounter (Signed)
Received call from [facility] and he was asking if this was a pt at our office as pt was all over the place at the home visit yesterday.  Upon checking pt was scheduled to see Dr Effie Berkshire yesterday for a hospital post op ( surgery on 5.16) but pt did not show up. I asked the home health agency to have pt call to reschedule the appt.

## 2022-07-13 NOTE — Telephone Encounter (Signed)
Patient in need of refills of metformin, rosuvastatin and Eliquis, but current prescripitons out of refills.   Would you please send renewals of these prescriptions to [facility] for patient?  Thank you!  Gentry Fitz

## 2022-07-14 ENCOUNTER — Ambulatory Visit (INDEPENDENT_AMBULATORY_CARE_PROVIDER_SITE_OTHER): Payer: Medicaid Other | Admitting: Podiatry

## 2022-07-14 ENCOUNTER — Telehealth: Payer: Self-pay | Admitting: *Deleted

## 2022-07-14 ENCOUNTER — Encounter: Payer: Self-pay | Admitting: Podiatry

## 2022-07-14 VITALS — BP 125/81 | HR 89

## 2022-07-14 DIAGNOSIS — L97512 Non-pressure chronic ulcer of other part of right foot with fat layer exposed: Secondary | ICD-10-CM

## 2022-07-14 DIAGNOSIS — Z89421 Acquired absence of other right toe(s): Secondary | ICD-10-CM

## 2022-07-14 MED ORDER — ROSUVASTATIN CALCIUM 20 MG PO TABS: 20.0000 mg | ORAL_TABLET | Freq: Every day | ORAL | 0 refills | Status: DC

## 2022-07-14 MED ORDER — METFORMIN HCL 1000 MG PO TABS: 1000.0000 mg | ORAL_TABLET | Freq: Two times a day (BID) | ORAL | 0 refills | Status: DC

## 2022-07-14 MED ORDER — APIXABAN 5 MG PO TABS: 5.0000 mg | ORAL_TABLET | Freq: Two times a day (BID) | ORAL | 0 refills | Status: DC

## 2022-07-14 NOTE — Progress Notes (Signed)
Subjective:  Patient ID: Martin Mullen, male    DOB: [birth year],  MRN: [id number]  Chief Complaint  Patient presents with   Routine Post Op    "It's doing okay."    DOS: 06/30/2022 Procedure: Right incision drainage washout debridement with delayed primary closure  42 y.o. male returns for post-op check.  Patient states that he is doing okay.  Bandages clean dry and intact.  He has been walking on it and may have gapped open.  Denies any other acute issues he has not been seen by anyone else prior to seeing me in between  Review of Systems: Negative except as noted in the HPI. Denies N/V/F/Ch.  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   Chicken pox    Coronary artery disease    Inferior ST elevation myocardial infarction in July of 2014. Cardiac catheterization showed 95% distal RCA stenosis and 90% first diagonal stenosis with normal ejection fraction. He underwent PCI in bare-metal stent placement to the distal RCA.   Diabetes mellitus without complication (HCC)    Hypercholesterolemia    Stroke (HCC)    Tobacco use     Current Outpatient Medications:    Accu-Chek Softclix Lancets lancets, Use to check blood sugar three times a day for type 2 diabetes E11.9, Disp: 100 each, Rfl: 5   acetaminophen (TYLENOL) 500 MG tablet, Take 2 tablets (1,000 mg total) by mouth every 6 (six) hours as needed., Disp: 100 tablet, Rfl: 2   amoxicillin-clavulanate (AUGMENTIN) 875-125 MG tablet, Take 1 tablet by mouth 2 (two) times daily for 21 days., Disp: 42 tablet, Rfl: 0   apixaban (ELIQUIS) 5 MG TABS tablet, Take 1 tablet (5 mg total) by mouth 2 (two) times daily., Disp: 180 tablet, Rfl: 0   aspirin EC 81 MG tablet, Take 1 tablet (81 mg total) by mouth daily., Disp: 90 tablet, Rfl: 0   glucose blood (ACCU-CHEK GUIDE) test strip, Use to check blood sugar three times a day for type 2 diabetes E11.9, Disp: 100  strip, Rfl: 5   insulin glargine (LANTUS SOLOSTAR) 100 UNIT/ML Solostar Pen, Inject 16 Units into the skin daily., Disp: 15 mL, Rfl: 0   linezolid (ZYVOX) 600 MG tablet, Take 1 tablet (600 mg total) by mouth 2 (two) times daily for 21 days., Disp: 42 tablet, Rfl: 0   losartan (COZAAR) 25 MG tablet, Take 1 tablet (25 mg total) by mouth daily., Disp: 90 tablet, Rfl: 0   metFORMIN (GLUCOPHAGE) 1000 MG tablet, Take 1 tablet (1,000 mg total) by mouth 2 (two) times daily with a meal. TAKE 1 TABLET(1000 MG) BY MOUTH TWICE DAILY WITH A MEAL, Disp: 180 tablet, Rfl: 0   nicotine (NICODERM CQ - DOSED IN MG/24 HOURS) 21 mg/24hr patch, Place 21 mg onto the skin daily., Disp: , Rfl:    OZEMPIC, 0.25 OR 0.5 MG/DOSE, 2 MG/3ML SOPN, Inject 0.5 mg into the skin once a week. Start with 0.25mg  weekly x 4 weeks then increase to 0.5mg  weekly injection. (Patient taking differently: Inject 0.5 mg into the skin once a week.), Disp: 9 mL, Rfl: 0   rosuvastatin (CRESTOR) 20 MG tablet, Take 1 tablet (20 mg total) by mouth daily., Disp: 90 tablet, Rfl: 0   sildenafil (VIAGRA) 50 MG tablet, Take 1 tablet (50 mg total) by mouth daily as needed for erectile dysfunction., Disp: 10 tablet, Rfl: 0  Social History  Tobacco Use  Smoking Status Every Day   Packs/day: 1.00   Years: 30.00   Additional pack years: 0.00   Total pack years: 30.00   Types: Cigarettes  Smokeless Tobacco Never    No Known Allergies Objective:   Vitals:   07/14/22 1428  BP: 125/81  Pulse: 89   There is no height or weight on file to calculate BMI. Constitutional Well developed. Well nourished.  Vascular Foot warm and well perfused. Capillary refill normal to all digits.   Neurologic Normal speech. Oriented to person, place, and time. Epicritic sensation to light touch grossly present bilaterally.  Dermatologic Skin healing well without signs of infection. Skin edges well coapted without signs of infection.  Dehiscence noted of the  amputation site.  Orthopedic: Tenderness to palpation noted about the surgical site.   Radiographs: None Assessment:  No diagnosis found. Plan:  Patient was evaluated and treated and all questions answered.  S/p foot surgery right -Progressing as expected post-operatively. -XR: See above -WB Status: Weightbearing as tolerated in surgical shoe -Sutures: Removed.  Deep dehiscence noted.  Given the findings of dehiscence patient will benefit from wound care center.  Referral to the wound care center was sent -Medications: None -Foot redressed.  No follow-ups on file.

## 2022-07-14 NOTE — Telephone Encounter (Signed)
RXs have been sent to the pharmacy.   Thanks,   -Vernona Rieger

## 2022-07-14 NOTE — Telephone Encounter (Signed)
Referral has been faxed to [facility] wound center, confirmation received.

## 2022-07-14 NOTE — Telephone Encounter (Signed)
-----   Message from Candelaria Stagers, DPM sent at 07/14/2022  2:40 PM EDT ----- Regarding: Referral to the wound care center [facility],   Can you refer this patient to the wound care center [facility].  I placed the order

## 2022-07-19 ENCOUNTER — Telehealth: Payer: Self-pay | Admitting: Internal Medicine

## 2022-07-19 ENCOUNTER — Inpatient Hospital Stay: Payer: Medicaid Other | Admitting: Infectious Diseases

## 2022-07-19 NOTE — Telephone Encounter (Signed)
noted 

## 2022-07-19 NOTE — Telephone Encounter (Signed)
Cesar with [facility] is a Physical Therapists. Maureen Ralphs states that he want to report a missed visit on 07/14/22 and this was a rescheduled visit. Per Maureen Ralphs when he arrived to the pt house he was not home and could not get him on the phone.

## 2022-07-20 ENCOUNTER — Other Ambulatory Visit
Admission: RE | Admit: 2022-07-20 | Discharge: 2022-07-20 | Disposition: A | Discharge status: Home / Self Care | Payer: Medicaid Other | Attending: Infectious Diseases | Admitting: Infectious Diseases

## 2022-07-20 ENCOUNTER — Encounter: Payer: Self-pay | Admitting: Podiatry

## 2022-07-20 DIAGNOSIS — A4902 Methicillin resistant Staphylococcus aureus infection, unspecified site: Secondary | ICD-10-CM

## 2022-07-20 LAB — CBC WITH DIFFERENTIAL/PLATELET
Abs Immature Granulocytes: 0.03 10*3/uL (ref 0.00–0.07)
Basophils Absolute: 0.1 10*3/uL (ref 0.0–0.1)
Basophils Relative: 1 %
Eosinophils Absolute: 0.2 10*3/uL (ref 0.0–0.5)
Eosinophils Relative: 3 %
HCT: 43.7 % (ref 39.0–52.0)
Hemoglobin: 14.9 g/dL (ref 13.0–17.0)
Immature Granulocytes: 0 %
Lymphocytes Relative: 32 %
Lymphs Abs: 2.9 10*3/uL (ref 0.7–4.0)
MCH: 30.3 pg (ref 26.0–34.0)
MCHC: 34.1 g/dL (ref 30.0–36.0)
MCV: 89 fL (ref 80.0–100.0)
Monocytes Absolute: 0.5 10*3/uL (ref 0.1–1.0)
Monocytes Relative: 5 %
Neutro Abs: 5.3 10*3/uL (ref 1.7–7.7)
Neutrophils Relative %: 59 %
Platelets: 266 10*3/uL (ref 150–400)
RBC: 4.91 MIL/uL (ref 4.22–5.81)
RDW: 13.8 % (ref 11.5–15.5)
WBC: 9.1 10*3/uL (ref 4.0–10.5)
nRBC: 0 % (ref 0.0–0.2)

## 2022-07-20 LAB — COMPREHENSIVE METABOLIC PANEL
ALT: 14 U/L (ref 0–44)
AST: 15 U/L (ref 15–41)
Albumin: 3.4 g/dL — ABNORMAL LOW (ref 3.5–5.0)
Alkaline Phosphatase: 136 U/L — ABNORMAL HIGH (ref 38–126)
Anion gap: 8 (ref 5–15)
BUN: 12 mg/dL (ref 6–20)
CO2: 24 mmol/L (ref 22–32)
Calcium: 9 mg/dL (ref 8.9–10.3)
Chloride: 102 mmol/L (ref 98–111)
Creatinine, Ser: 0.77 mg/dL (ref 0.61–1.24)
GFR, Estimated: >60 mL/min (ref >60)
Glucose, Bld: 258 mg/dL — ABNORMAL HIGH (ref 70–99)
Potassium: 4.1 mmol/L (ref 3.5–5.1)
Sodium: 134 mmol/L — ABNORMAL LOW (ref 135–145)
Total Bilirubin: 0.5 mg/dL (ref 0.3–1.2)
Total Protein: 7.6 g/dL (ref 6.5–8.1)

## 2022-07-21 ENCOUNTER — Inpatient Hospital Stay: Payer: Medicaid Other | Admitting: Infectious Diseases

## 2022-07-25 ENCOUNTER — Ambulatory Visit: Payer: Medicaid Other | Admitting: Pharmacist

## 2022-07-25 DIAGNOSIS — E1169 Type 2 diabetes mellitus with other specified complication: Secondary | ICD-10-CM

## 2022-07-25 DIAGNOSIS — I10 Essential (primary) hypertension: Secondary | ICD-10-CM

## 2022-07-25 NOTE — Progress Notes (Signed)
07/25/2022 Name: Martin Mullen MRN: [id number] DOB: 1980-08-30  Chief Complaint  Patient presents with   Medication Management   Medication Adherence    Martin Mullen is a 42 y.o. year old male who presented for a telephone visit.   They were referred to the pharmacist by their PCP for assistance in managing diabetes and medication access.    Patient is participating in a Managed Medicaid Plan:  [employer]arilyn Sites Managed Medicaid     Subjective:   Care Team: Primary Care Provider: [name], NP ; Next Scheduled Visit: 08/11/2022 Managed Medicaid RN: Danie Chandler, RN Managed Medicaid Social Worker: Gus Puma, [place], [place] Podiatry: Candelaria Stagers, DPM; Next Scheduled Visit: 08/02/2022 Infectious Disease Specialist: Lynn Ito, MD Wound Care Specialist: Camelia Phenes, DO; Initial Scheduled Visit: 07/27/2022    Medication Access/Adherence  Current Pharmacy:  [id number] Nicholes Rough,  - [address] AT [facility] [address] [place] [address] Phone: [phone] Fax: [phone]  [facility] [address] [place] [address] Phone: [phone] Fax: [phone]  [facility] [address] [place] [address] Phone: [phone] Fax: [phone]   Patient reports affordability concerns with their medications: No  Patient reports access/transportation concerns to their pharmacy: No  Patient reports adherence concerns with their medications:  No    Patient using a weekly pillbox to organize his medications  - Reports improved medication adherence since started using this tool  From review of chart, note patient seen for Office Visit with [facility] on 5/30. Referral placed to Wound Care Center - Scheduled to see Wound Care Clinic on 6/12  Today patient reports  that his foot is healing well. Denies any pain, fever or other symptoms - Reports now taking both Augmentin and linezolid antibiotics as directed Today patient shares that at the beginning of these courses, right after discharge, he did not understand the instructions and was only taking each once daily for ~1 week. However, reports since that time taking twice daily consistently - Patient plans to attend appointment with Wound Care Clinic on 6/12 as scheduled - From review of chart, note patient not seen for appointment with Infectious Disease Specialist last week as scheduled. Patient reports appointment was too early for him and that he contacted the office and just had lab work drawn instead on 6/5.  Reports currently seen by Home Health Nursing daily, but that he was advised that Home Health PT would not return until patient seen by PCP.    Diabetes:   Current medications:  - metformin 1000 mg twice daily - Lantus 16 units daily - Ozempic 0.5 mg weekly on Fridays (restarted 5/24)   Medications tried in the past/contraindications: glipizide, Januvia; note would avoid SGLT2-inhibitor as patient with history of Fournier gangrene   Reports recent home glucose readings ranging 135-176   Patient denies hypoglycemic s/sx including dizziness, shakiness, sweating   Reports has further reduced sugary beverage consumption to 1 x 28 oz bottle of Gatorade/day, otherwise drinking water.    Patient notes he has limited ability to chew as has top dentures, but no bottom             Reports plans to work with insurance to obtain bottom dentures       Hyperlipidemia/ASCVD Risk Reduction   Current lipid lowering medications:  rosuvastatin 20 mg daily   Antiplatelet regimen: aspirin 81 mg (note patient also on Eliquis 5 mg twice daily)   ASCVD History: medical history significant of STEMI s/p RCA stent 2014, hypertension, recurrent strokes, heterozygous prothrombin gene mutation on Eliquis,  insulin-dependent type 2 diabetes  Risk Factors: tobacco use, uncontrolled T2DM     Hypertension:   Current medications: none   From review of chart, note at Office Visit on 05/10/2022, PCP advised patient to start losartan 25 mg daily for blood pressure control - From review of dispensing record/pharmacy, note patient has not yet picked up this medication       Objective:  Lab Results  Component Value Date   HGBA1C 8.2 (A) 05/10/2022    Lab Results  Component Value Date   CREATININE 0.77 07/20/2022   BUN 12 07/20/2022   NA 134 (L) 07/20/2022   K 4.1 07/20/2022   CL 102 07/20/2022   CO2 24 07/20/2022    Lab Results  Component Value Date   CHOL 141 05/10/2022   HDL 36 (L) 05/10/2022   LDLCALC 83 05/10/2022   LDLDIRECT 126.0 06/20/2017   TRIG 128 05/10/2022   CHOLHDL 3.9 05/10/2022   BP Readings from Last 3 Encounters:  07/14/22 125/81  07/06/22 (!) 140/90  07/01/22 107/80   Pulse Readings from Last 3 Encounters:  07/14/22 89  07/06/22 88  07/01/22 86     Medications Reviewed Today     Reviewed by Enedina Finner (Podiatric Assistant) on 07/14/22 at 1420  Med List Status: <None>   Medication Order Taking? Sig Documenting Provider Last Dose Status Informant  Accu-Chek Softclix Lancets lancets [id number] Yes Use to check blood sugar three times a day for type 2 diabetes E11.9 [name], NP Taking Active Self  acetaminophen (TYLENOL) 500 MG tablet [id number] Yes Take 2 tablets (1,000 mg total) by mouth every 6 (six) hours as needed. Varney Daily, PA Taking Active Self  amoxicillin-clavulanate (AUGMENTIN) 875-125 MG tablet [id number] Yes Take 1 tablet by mouth 2 (two) times daily for 21 days. Lynn Ito, MD Taking Active   apixaban (ELIQUIS) 5 MG TABS tablet [id number] Yes Take 1 tablet (5 mg total) by mouth 2 (two) times daily. [name], NP Taking Active   aspirin EC 81 MG tablet [id number] Yes Take 1 tablet (81 mg total) by mouth  daily. [name], NP Taking Active Self  glucose blood (ACCU-CHEK GUIDE) test strip [id number] Yes Use to check blood sugar three times a day for type 2 diabetes E11.9 [name], NP Taking Active Self  insulin glargine (LANTUS SOLOSTAR) 100 UNIT/ML Solostar Pen [id number] Yes Inject 16 Units into the skin daily. [name], NP Taking Active Self  linezolid (ZYVOX) 600 MG tablet [id number] Yes Take 1 tablet (600 mg total) by mouth 2 (two) times daily for 21 days. Lynn Ito, MD Taking Active   losartan (COZAAR) 25 MG tablet [id number] Yes Take 1 tablet (25 mg total) by mouth daily. [name], NP Taking Active Self  metFORMIN (GLUCOPHAGE) 1000 MG tablet [id number] Yes Take 1 tablet (1,000 mg total) by mouth 2 (two) times daily with a meal. TAKE 1 TABLET(1000 MG) BY MOUTH TWICE DAILY WITH A MEAL [name], Salvadore Oxford, NP Taking Active   nicotine (NICODERM CQ - DOSED IN MG/24 HOURS) 21 mg/24hr patch [id number] Yes Place 21 mg onto the skin daily. [provider] Taking Active Self  Med Note Laurell Roof Jun 26, 2022 10:46 PM) For in hospital use.  OZEMPIC, 0.25 OR 0.5 MG/DOSE, 2 MG/3ML SOPN [id number] Yes Inject 0.5 mg into the skin once a week. Start with 0.25mg  weekly x 4 weeks then increase to 0.5mg  weekly injection.  Patient taking differently: Inject 0.5 mg into the skin once a week.   [name], NP Taking Active Self  rosuvastatin (CRESTOR) 20 MG tablet [id number] Yes Take 1 tablet (20 mg total) by mouth daily. [name], NP Taking Active   sildenafil (VIAGRA) 50 MG tablet [id number] Yes Take 1 tablet (50 mg total) by mouth daily as needed for erectile dysfunction. [name], NP Taking Active Self              Assessment/Plan:   Coordination of Care: - Place call to [facility] to collaborate/determine if patient needing further appointment with this provider (note per Epic, missed appointment on 6/6). Also  let provider know about concern for patient's antibiotic adherence with start of course of Augmentin and linezolid   Advise patient to contact PCP office to follow up regarding Home Health Physical Therapy - Will collaborate with PCP   Counsel patient on importance of taking and completing both antibiotic courses as directed    Diabetes: - Currently uncontrolled - Reviewed dietary modifications including working toward having regular well-balanced meals spread throughout the day, while controlling carbohydrate portion sizes             Encourage patient to continue to cut back on consumption of sugary beverages             Discuss strategies to increase intake of non-starchy vegetables, such as broccoli - Have counseled patient on s/s of low blood sugar and how to treat lows Review rules of 15s - importance of using 15 grams of sugar to treat low, recheck blood sugar in 15 minutes, treat again if remains low or, if back to normal, having meal if mealtime or snack  Encourage patient to pick up and carry glucose tablets - Recommend to continue checking blood glucose, keep log of results, have this record to review during next appointment, but to contact office sooner for readings outside of established parameters of for symptoms     Hypertension: - Recommend to pick up losartan prescription and start taking as directed by PCP       Follow Up Plan: Clinical Pharmacist will follow up with patient by telephone again on 08/29/2022 at 1:45 pm     Estelle Grumbles, PharmD, [facility] Clinical Pharmacist [facility] [phone]

## 2022-07-25 NOTE — Patient Instructions (Signed)
Goals Addressed             This Visit's Progress    Pharmacy Goals       Our goal A1c is less than 7%. This corresponds with fasting sugars less than 130 and 2 hour after meal sugars less than 180. Please keep a log of your results when checking your blood sugar   Our goal bad cholesterol, or LDL, is less than 70 . This is why it is important to continue taking your rosuvastatin.  Please follow up with your pharmacy to pick up and start taking losartan 25 mg daily as prescribed by your primary care provider for blood pressure control.  Check your blood pressure twice weekly, and any time you have concerning symptoms like headache, chest pain, dizziness, shortness of breath, or vision changes.   Our goal is less than 130/80.  To appropriately check your blood pressure, make sure you do the following:  1) Avoid caffeine, exercise, or tobacco products for 30 minutes before checking. Empty your bladder. 2) Sit with your back supported in a flat-backed chair. Rest your arm on something flat (arm of the chair, table, etc). 3) Sit still with your feet flat on the floor, resting, for at least 5 minutes.  4) Check your blood pressure. Take 1-2 readings.  5) Write down these readings and bring with you to any provider appointments.  Bring your home blood pressure machine with you to a provider's office for accuracy comparison at least once a year.   Make sure you take your blood pressure medications before you come to any office visit, even if you were asked to fast for labs.   Kwan Shellhammer Darnisha Vernet, PharmD, BCACP Clinical Pharmacist [facility] [phone]         

## 2022-07-27 ENCOUNTER — Ambulatory Visit: Payer: Medicaid Other | Admitting: Internal Medicine

## 2022-08-02 ENCOUNTER — Ambulatory Visit: Payer: Medicaid Other | Admitting: Podiatry

## 2022-08-04 ENCOUNTER — Other Ambulatory Visit: Payer: Self-pay | Admitting: Internal Medicine

## 2022-08-04 NOTE — Telephone Encounter (Signed)
Requested Prescriptions  Pending Prescriptions Disp Refills   sildenafil (VIAGRA) 50 MG tablet [Pharmacy Med Name: SILDENAFIL 50MG  TABLETS] 10 tablet 0    Sig: TAKE 1 TABLET(50 MG) BY MOUTH DAILY AS NEEDED FOR ERECTILE DYSFUNCTION     Urology: Erectile Dysfunction Agents Passed - 08/04/2022  3:41 AM      Passed - AST in normal range and within 360 days    AST  Date Value Ref Range Status  07/20/2022 15 15 - 41 U/L Final         Passed - ALT in normal range and within 360 days    ALT  Date Value Ref Range Status  07/20/2022 14 0 - 44 U/L Final         Passed - Last BP in normal range    BP Readings from Last 1 Encounters:  07/14/22 125/81         Passed - Valid encounter within last 12 months    Recent Outpatient Visits           1 week ago Type 2 diabetes mellitus with hyperlipidemia [facility])   [place] [facility] Delles, Gentry Fitz A, RPH-CPP   3 weeks ago Type 2 diabetes mellitus with hyperlipidemia [facility])   [place] [facility] Delles, Gentry Fitz A, RPH-CPP   2 months ago Adjustment disorder with mixed anxiety and depressed mood   [place] [facility] Olpe, [place] W, NP   3 months ago Type 2 diabetes mellitus with hyperlipidemia [facility])   [place] [facility] Delles, Gentry Fitz A, RPH-CPP   3 months ago Type 2 diabetes mellitus with hyperlipidemia [facility])   [place] [facility] Delles, Jackelyn Poling, RPH-CPP       Future Appointments             In 1 week [name], Salvadore Oxford, NP [facility]

## 2022-08-08 ENCOUNTER — Ambulatory Visit: Payer: Medicaid Other | Admitting: Physician Assistant

## 2022-08-11 ENCOUNTER — Encounter: Payer: Self-pay | Admitting: Internal Medicine

## 2022-08-11 ENCOUNTER — Ambulatory Visit (INDEPENDENT_AMBULATORY_CARE_PROVIDER_SITE_OTHER): Payer: Medicaid Other | Admitting: Internal Medicine

## 2022-08-11 VITALS — BP 128/82 | HR 110 | Temp 96.6°F | Wt 190.0 lb

## 2022-08-11 DIAGNOSIS — E663 Overweight: Secondary | ICD-10-CM

## 2022-08-11 DIAGNOSIS — Z794 Long term (current) use of insulin: Secondary | ICD-10-CM

## 2022-08-11 DIAGNOSIS — Z0001 Encounter for general adult medical examination with abnormal findings: Secondary | ICD-10-CM | POA: Diagnosis not present

## 2022-08-11 DIAGNOSIS — Z6827 Body mass index (BMI) 27.0-27.9, adult: Secondary | ICD-10-CM

## 2022-08-11 DIAGNOSIS — E1165 Type 2 diabetes mellitus with hyperglycemia: Secondary | ICD-10-CM

## 2022-08-11 LAB — CBC
HCT: 46.7 % (ref 38.5–50.0)
MCH: 30.9 pg (ref 27.0–33.0)
MPV: 9.7 fL (ref 7.5–12.5)
Platelets: 270 10*3/uL (ref 140–400)
RBC: 5.15 10*6/uL (ref 4.20–5.80)
RDW: 14.7 % (ref 11.0–15.0)

## 2022-08-11 NOTE — Progress Notes (Signed)
Subjective:    Patient ID: Martin Mullen, male    DOB: 1980-06-06, 42 y.o.   MRN: [id number]  HPI  Patient presents to clinic today for his annual exam.  Flu: 11/2020 Tetanus: 06/2012 Pneumovax: 06/2017 COVID: Never Vision screening: as needed Dentist: no teeth  Diet: He does not eat meat. He consumes some fruits and veggies. He tries to avoid fried foods. He drinks mostly gatorade Exercise: None  Review of Systems     Past Medical History:  Diagnosis Date   Acute transmural inferior wall MI (HCC) 08/28/2012   .5 x 12 mm Veri-flex stent non-DES.   Chicken pox    Coronary artery disease    Inferior ST elevation myocardial infarction in July of 2014. Cardiac catheterization showed 95% distal RCA stenosis and 90% first diagonal stenosis with normal ejection fraction. He underwent PCI in bare-metal stent placement to the distal RCA.   Diabetes mellitus without complication (HCC)    Hypercholesterolemia    Stroke (HCC)    Tobacco use     Current Outpatient Medications  Medication Sig Dispense Refill   Accu-Chek Softclix Lancets lancets Use to check blood sugar three times a day for type 2 diabetes E11.9 100 each 5   acetaminophen (TYLENOL) 500 MG tablet Take 2 tablets (1,000 mg total) by mouth every 6 (six) hours as needed. 100 tablet 2   apixaban (ELIQUIS) 5 MG TABS tablet Take 1 tablet (5 mg total) by mouth 2 (two) times daily. 180 tablet 0   aspirin EC 81 MG tablet Take 1 tablet (81 mg total) by mouth daily. 90 tablet 0   glucose blood (ACCU-CHEK GUIDE) test strip Use to check blood sugar three times a day for type 2 diabetes E11.9 100 strip 5   insulin glargine (LANTUS SOLOSTAR) 100 UNIT/ML Solostar Pen Inject 16 Units into the skin daily. 15 mL 0   losartan (COZAAR) 25 MG tablet Take 1 tablet (25 mg total) by mouth daily. 90 tablet 0   metFORMIN (GLUCOPHAGE) 1000 MG tablet Take 1 tablet (1,000 mg total) by mouth 2 (two) times daily with a meal. TAKE 1 TABLET(1000 MG) BY  MOUTH TWICE DAILY WITH A MEAL 180 tablet 0   nicotine (NICODERM CQ - DOSED IN MG/24 HOURS) 21 mg/24hr patch Place 21 mg onto the skin daily.     OZEMPIC, 0.25 OR 0.5 MG/DOSE, 2 MG/3ML SOPN Inject 0.5 mg into the skin once a week. Start with 0.25mg  weekly x 4 weeks then increase to 0.5mg  weekly injection. (Patient taking differently: Inject 0.5 mg into the skin once a week.) 9 mL 0   rosuvastatin (CRESTOR) 20 MG tablet Take 1 tablet (20 mg total) by mouth daily. 90 tablet 0   sildenafil (VIAGRA) 50 MG tablet TAKE 1 TABLET(50 MG) BY MOUTH DAILY AS NEEDED FOR ERECTILE DYSFUNCTION 10 tablet 0   No current facility-administered medications for this visit.    No Known Allergies  Family History  Problem Relation Age of Onset   Arthritis Mother        Rheumatoid   Diabetes Mother    Hyperlipidemia Mother    Diabetes Father    Cancer Maternal Grandfather        Prostate   Parkinson's disease Maternal Grandfather     Social History   Socioeconomic History   Marital status: Divorced    Spouse name: Not on file   Number of children: 2   Years of education: Not on file   Highest education  level: Not on file  Occupational History   Not on file  Tobacco Use   Smoking status: Every Day    Packs/day: 1.00    Years: 30.00    Additional pack years: 0.00    Total pack years: 30.00    Types: Cigarettes   Smokeless tobacco: Never  Vaping Use   Vaping Use: Never used  Substance and Sexual Activity   Alcohol use: Not Currently    Alcohol/week: 0.0 standard drinks of alcohol    Comment: occasional   Drug use: Not Currently    Types: Marijuana   Sexual activity: Yes    Birth control/protection: None  Other Topics Concern   Not on file  Social History Narrative   Lives with girlfriend and her mother    Social Determinants of Health   Financial Resource Strain: Low Risk  (06/10/2022)   Overall Financial Resource Strain (CARDIA)    Difficulty of Paying Living Expenses: Not very hard   Food Insecurity: No Food Insecurity (06/29/2022)   Hunger Vital Sign    Worried About Running Out of Food in the Last Year: Never true    Ran Out of Food in the Last Year: Never true  Transportation Needs: No Transportation Needs (06/29/2022)   PRAPARE - Administrator, Civil Service (Medical): No    Lack of Transportation (Non-Medical): No  Physical Activity: Not on file  Stress: Not on file  Social Connections: Not on file  Intimate Partner Violence: Not At Risk (06/29/2022)   Humiliation, Afraid, Rape, and Kick questionnaire    Fear of Current or Ex-Partner: No    Emotionally Abused: No    Physically Abused: No    Sexually Abused: No     Constitutional: Denies fever, malaise, fatigue, headache or abrupt weight changes.  HEENT: Denies eye pain, eye redness, ear pain, ringing in the ears, wax buildup, runny nose, nasal congestion, bloody nose, or sore throat. Respiratory: Denies difficulty breathing, shortness of breath, cough or sputum production.   Cardiovascular: Denies chest pain, chest tightness, palpitations or swelling in the hands or feet.  Gastrointestinal: Denies abdominal pain, bloating, constipation, diarrhea or blood in the stool.  GU: Denies urgency, frequency, pain with urination, burning sensation, blood in urine, odor or discharge. Musculoskeletal: Patient reports difficulty with gait.  Denies decrease in range of motion, muscle pain or joint pain and swelling.  Skin: Patient reports open wound to right foot.  Denies redness, rashes, lesions or ulcercations.  Neurological: Patient reports difficulty with memory.  Denies dizziness, difficulty with speech or problems with balance and coordination.  Psych: Patient has a history of anxiety and depression.  Denies SI/HI.  No other specific complaints in a complete review of systems (except as listed in HPI above).  Objective:   Physical Exam   BP 128/82 (BP Location: Left Arm, Patient Position: Sitting,  Cuff Size: Normal)   Pulse (!) 110   Temp (!) 96.6 F (35.9 C) (Temporal)   Wt 190 lb (86.2 kg)   SpO2 97%   BMI 27.26 kg/m   Wt Readings from Last 3 Encounters:  06/30/22 203 lb 14.8 oz (92.5 kg)  05/10/22 204 lb (92.5 kg)  02/08/22 191 lb (86.6 kg)    General: Appears older than his stated age, overweight, chronically ill-appearing, in NAD. Skin: Open wound to right foot wrapped. HEENT: Head: normal shape and size; Eyes: sclera white, no icterus, conjunctiva pink, PERRLA and EOMs intact; Ears: Tm's gray and intact, normal light reflex;  Neck:  Neck supple, trachea midline. No masses, lumps or thyromegaly present.  Cardiovascular: Tachycardic with normal rhythm. S1,S2 noted.  No murmur, rubs or gallops noted. No JVD or BLE edema.  Pulmonary/Chest: Normal effort and positive vesicular breath sounds. No respiratory distress. No wheezes, rales or ronchi noted.  Abdomen: Soft and nontender. Normal bowel sounds.  Musculoskeletal: Amputation noted of 2 toe, right foot.  Strength 5/5 BUE/BLE.  Using walker for assistance with gait as his gait is unsteady due to balance problems. Neurological: Alert and oriented. Psychiatric: Mood and affect normal. Behavior is normal. Judgment and thought content normal.    BMET    Component Value Date/Time   NA 134 (L) 07/20/2022 1627   NA 140 08/11/2021 0937   NA 139 01/29/2013 0040   K 4.1 07/20/2022 1627   K 4.0 01/29/2013 0040   CL 102 07/20/2022 1627   CL 107 01/29/2013 0040   CO2 24 07/20/2022 1627   CO2 28 01/29/2013 0040   GLUCOSE 258 (H) 07/20/2022 1627   GLUCOSE 228 (H) 01/29/2013 0040   BUN 12 07/20/2022 1627   BUN 11 08/11/2021 0937   BUN 15 01/29/2013 0040   CREATININE 0.77 07/20/2022 1627   CREATININE 1.03 05/10/2022 1501   CALCIUM 9.0 07/20/2022 1627   CALCIUM 9.2 01/29/2013 0040   GFRNONAA >60 07/20/2022 1627   GFRNONAA >60 01/29/2013 0040   GFRAA 104 11/13/2019 1550   GFRAA >60 01/29/2013 0040    Lipid Panel      Component Value Date/Time   CHOL 141 05/10/2022 1501   CHOL 159 08/11/2021 0937   TRIG 128 05/10/2022 1501   HDL 36 (L) 05/10/2022 1501   HDL 26 (L) 08/11/2021 0937   CHOLHDL 3.9 05/10/2022 1501   VLDL 22 09/12/2021 0419   LDLCALC 83 05/10/2022 1501    CBC    Component Value Date/Time   WBC 9.1 07/20/2022 1627   RBC 4.91 07/20/2022 1627   HGB 14.9 07/20/2022 1627   HGB 17.9 (H) 08/11/2021 0937   HCT 43.7 07/20/2022 1627   HCT 51.0 08/11/2021 0937   PLT 266 07/20/2022 1627   PLT 261 08/11/2021 0937   MCV 89.0 07/20/2022 1627   MCV 93 08/11/2021 0937   MCV 93 01/29/2013 0040   MCH 30.3 07/20/2022 1627   MCHC 34.1 07/20/2022 1627   RDW 13.8 07/20/2022 1627   RDW 12.4 08/11/2021 0937   RDW 13.8 01/29/2013 0040   LYMPHSABS 2.9 07/20/2022 1627   LYMPHSABS 4.8 (H) 08/11/2021 0937   MONOABS 0.5 07/20/2022 1627   EOSABS 0.2 07/20/2022 1627   EOSABS 0.5 (H) 08/11/2021 0937   BASOSABS 0.1 07/20/2022 1627   BASOSABS 0.1 08/11/2021 0937    Hgb A1C Lab Results  Component Value Date   HGBA1C 8.2 (A) 05/10/2022           Assessment & Plan:   Preventative health maintenance:  Encouraged him to get a flu shot in the fall He declines tetanus today Encouraged him to get his COVID-vaccine Pneumovax today Encouraged him to consume a balanced diet and exercise regimen Advised him to see an eye doctor and dentist annually We will check CBC, c-Met, lipid, A1c today  RTC in 3 months, follow-up chronic conditions [name], NP

## 2022-08-11 NOTE — Assessment & Plan Note (Signed)
Encourage diet and exercise for weight loss 

## 2022-08-11 NOTE — Patient Instructions (Signed)
Health Maintenance, Male Adopting a healthy lifestyle and getting preventive care are important in promoting health and wellness. Ask your health care provider about: The right schedule for you to have regular tests and exams. Things you can do on your own to prevent diseases and keep yourself healthy. What should I know about diet, weight, and exercise? Eat a healthy diet  Eat a diet that includes plenty of vegetables, fruits, low-fat dairy products, and lean protein. Do not eat a lot of foods that are high in solid fats, added sugars, or sodium. Maintain a healthy weight Body mass index (BMI) is a measurement that can be used to identify possible weight problems. It estimates body fat based on height and weight. Your health care provider can help determine your BMI and help you achieve or maintain a healthy weight. Get regular exercise Get regular exercise. This is one of the most important things you can do for your health. Most adults should: Exercise for at least 150 minutes each week. The exercise should increase your heart rate and make you sweat (moderate-intensity exercise). Do strengthening exercises at least twice a week. This is in addition to the moderate-intensity exercise. Spend less time sitting. Even light physical activity can be beneficial. Watch cholesterol and blood lipids Have your blood tested for lipids and cholesterol at 42 years of age, then have this test every 5 years. You may need to have your cholesterol levels checked more often if: Your lipid or cholesterol levels are high. You are older than 42 years of age. You are at high risk for heart disease. What should I know about cancer screening? Many types of cancers can be detected early and may often be prevented. Depending on your health history and family history, you may need to have cancer screening at various ages. This may include screening for: Colorectal cancer. Prostate cancer. Skin cancer. Lung  cancer. What should I know about heart disease, diabetes, and high blood pressure? Blood pressure and heart disease High blood pressure causes heart disease and increases the risk of stroke. This is more likely to develop in people who have high blood pressure readings or are overweight. Talk with your health care provider about your target blood pressure readings. Have your blood pressure checked: Every 3-5 years if you are 18-39 years of age. Every year if you are 40 years old or older. If you are between the ages of 65 and 75 and are a current or former smoker, ask your health care provider if you should have a one-time screening for abdominal aortic aneurysm (AAA). Diabetes Have regular diabetes screenings. This checks your fasting blood sugar level. Have the screening done: Once every three years after age 45 if you are at a normal weight and have a low risk for diabetes. More often and at a younger age if you are overweight or have a high risk for diabetes. What should I know about preventing infection? Hepatitis B If you have a higher risk for hepatitis B, you should be screened for this virus. Talk with your health care provider to find out if you are at risk for hepatitis B infection. Hepatitis C Blood testing is recommended for: Everyone born from 1945 through 1965. Anyone with known risk factors for hepatitis C. Sexually transmitted infections (STIs) You should be screened each year for STIs, including gonorrhea and chlamydia, if: You are sexually active and are younger than 42 years of age. You are older than 42 years of age and your   health care provider tells you that you are at risk for this type of infection. Your sexual activity has changed since you were last screened, and you are at increased risk for chlamydia or gonorrhea. Ask your health care provider if you are at risk. Ask your health care provider about whether you are at high risk for HIV. Your health care provider  may recommend a prescription medicine to help prevent HIV infection. If you choose to take medicine to prevent HIV, you should first get tested for HIV. You should then be tested every 3 months for as long as you are taking the medicine. Follow these instructions at home: Alcohol use Do not drink alcohol if your health care provider tells you not to drink. If you drink alcohol: Limit how much you have to 0-2 drinks a day. Know how much alcohol is in your drink. In the [place] one drink equals one 12 oz bottle of beer (355 mL), one 5 oz glass of wine (148 mL), or one 1 oz glass of hard liquor (44 mL). Lifestyle Do not use any products that contain nicotine or tobacco. These products include cigarettes, chewing tobacco, and vaping devices, such as e-cigarettes. If you need help quitting, ask your health care provider. Do not use street drugs. Do not share needles. Ask your health care provider for help if you need support or information about quitting drugs. General instructions Schedule regular health, dental, and eye exams. Stay current with your vaccines. Tell your health care provider if: You often feel depressed. You have ever been abused or do not feel safe at home. Summary Adopting a healthy lifestyle and getting preventive care are important in promoting health and wellness. Follow your health care provider's instructions about healthy diet, exercising, and getting tested or screened for diseases. Follow your health care provider's instructions on monitoring your cholesterol and blood pressure. This information is not intended to replace advice given to you by your health care provider. Make sure you discuss any questions you have with your health care provider. Document Revised: 06/22/2020 Document Reviewed: 06/22/2020 Elsevier Patient Education  2024 [employer]  

## 2022-08-12 LAB — COMPLETE METABOLIC PANEL WITH GFR
AG Ratio: 1.6 (calc) (ref 1.0–2.5)
ALT: 7 U/L — ABNORMAL LOW (ref 9–46)
AST: 7 U/L — ABNORMAL LOW (ref 10–40)
Albumin: 3.9 g/dL (ref 3.6–5.1)
Alkaline phosphatase (APISO): 116 U/L (ref 36–130)
BUN: 9 mg/dL (ref 7–25)
CO2: 21 mmol/L (ref 20–32)
Calcium: 9.6 mg/dL (ref 8.6–10.3)
Chloride: 102 mmol/L (ref 98–110)
Creat: 0.82 mg/dL (ref 0.60–1.29)
Globulin: 2.5 g/dL (calc) (ref 1.9–3.7)
Glucose, Bld: 141 mg/dL — ABNORMAL HIGH (ref 65–99)
Potassium: 4.2 mmol/L (ref 3.5–5.3)
Sodium: 135 mmol/L (ref 135–146)
Total Bilirubin: 0.4 mg/dL (ref 0.2–1.2)
Total Protein: 6.4 g/dL (ref 6.1–8.1)
eGFR: 112 mL/min/{1.73_m2} (ref >=60)

## 2022-08-12 LAB — LIPID PANEL
Cholesterol: 124 mg/dL (ref <200)
HDL: 27 mg/dL — ABNORMAL LOW (ref >=40)
LDL Cholesterol (Calc): 71 mg/dL (calc)
Non-HDL Cholesterol (Calc): 97 mg/dL (calc) (ref <130)
Total CHOL/HDL Ratio: 4.6 (calc) (ref <5.0)
Triglycerides: 183 mg/dL — ABNORMAL HIGH (ref <150)

## 2022-08-12 LAB — HEMOGLOBIN A1C
Hgb A1c MFr Bld: 7.7 % of total Hgb — ABNORMAL HIGH (ref <5.7)
Mean Plasma Glucose: 174 mg/dL
eAG (mmol/L): 9.7 mmol/L

## 2022-08-12 LAB — CBC
Hemoglobin: 15.9 g/dL (ref 13.2–17.1)
MCHC: 34 g/dL (ref 32.0–36.0)
MCV: 90.7 fL (ref 80.0–100.0)
WBC: 8.7 10*3/uL (ref 3.8–10.8)

## 2022-08-22 ENCOUNTER — Telehealth: Payer: Self-pay

## 2022-08-22 ENCOUNTER — Encounter: Payer: Medicaid Other | Attending: Podiatry | Admitting: Physician Assistant

## 2022-08-22 DIAGNOSIS — E11621 Type 2 diabetes mellitus with foot ulcer: Secondary | ICD-10-CM | POA: Insufficient documentation

## 2022-08-22 DIAGNOSIS — Z955 Presence of coronary angioplasty implant and graft: Secondary | ICD-10-CM | POA: Diagnosis not present

## 2022-08-22 DIAGNOSIS — Z09 Encounter for follow-up examination after completed treatment for conditions other than malignant neoplasm: Secondary | ICD-10-CM | POA: Insufficient documentation

## 2022-08-22 DIAGNOSIS — F1410 Cocaine abuse, uncomplicated: Secondary | ICD-10-CM | POA: Insufficient documentation

## 2022-08-22 DIAGNOSIS — L97429 Non-pressure chronic ulcer of left heel and midfoot with unspecified severity: Secondary | ICD-10-CM | POA: Insufficient documentation

## 2022-08-22 DIAGNOSIS — E785 Hyperlipidemia, unspecified: Secondary | ICD-10-CM | POA: Diagnosis not present

## 2022-08-22 DIAGNOSIS — L97522 Non-pressure chronic ulcer of other part of left foot with fat layer exposed: Secondary | ICD-10-CM | POA: Diagnosis not present

## 2022-08-22 DIAGNOSIS — Z8673 Personal history of transient ischemic attack (TIA), and cerebral infarction without residual deficits: Secondary | ICD-10-CM | POA: Insufficient documentation

## 2022-08-22 DIAGNOSIS — E1151 Type 2 diabetes mellitus with diabetic peripheral angiopathy without gangrene: Secondary | ICD-10-CM | POA: Insufficient documentation

## 2022-08-22 DIAGNOSIS — I2510 Atherosclerotic heart disease of native coronary artery without angina pectoris: Secondary | ICD-10-CM | POA: Diagnosis not present

## 2022-08-22 DIAGNOSIS — L97512 Non-pressure chronic ulcer of other part of right foot with fat layer exposed: Secondary | ICD-10-CM | POA: Diagnosis not present

## 2022-08-22 DIAGNOSIS — F17200 Nicotine dependence, unspecified, uncomplicated: Secondary | ICD-10-CM | POA: Insufficient documentation

## 2022-08-22 DIAGNOSIS — I252 Old myocardial infarction: Secondary | ICD-10-CM | POA: Insufficient documentation

## 2022-08-22 DIAGNOSIS — E1140 Type 2 diabetes mellitus with diabetic neuropathy, unspecified: Secondary | ICD-10-CM | POA: Insufficient documentation

## 2022-08-22 DIAGNOSIS — I10 Essential (primary) hypertension: Secondary | ICD-10-CM | POA: Insufficient documentation

## 2022-08-22 NOTE — Telephone Encounter (Signed)
Copied from CRM [id number]. Topic: Quick Communication - Home Health Verbal Orders >> Aug 22, 2022  8:59 AM Pincus Sanes wrote: Caller/Agency: Debbora Dus [facility] with [facility] Number: [phone] Requesting PT/ @ times a wk for 2 wks  then 1 time for 1 wk

## 2022-08-22 NOTE — Progress Notes (Signed)
ELIAV, JUMA ([id number]) [phone] [id number] Page 1 of 5 Visit Report for 08/22/2022 Abuse Risk Screen Details Patient Name: Date of Service: Martin Mullen, Martin TTY L. 08/22/2022 2:00 PM Medical Record Number: [id number] Patient Account Number: [id number] Date of Birth/Sex: Treating RN: [birth year] (42 y.o. Martin Mullen Primary Care Taygen Newsome: [name] Other Clinician: Referring Cierra Rothgeb: Treating Maimouna Rondeau/Extender: Donette Larry in Treatment: 0 Abuse Risk Screen Items Answer Electronic Signature(s) Signed: 08/22/2022 4:57:39 PM By: Midge Aver MSN RN CNS WTA Entered By: Midge Aver on 08/22/2022 16:57:39 -------------------------------------------------------------------------------- Activities of Daily Living Details Patient Name: Date of Service: Martin Mullen, Martin TTY L. 08/22/2022 2:00 PM Medical Record Number: [id number] Patient Account Number: [id number] Date of Birth/Sex: Treating RN: 1980-03-02 (42 y.o. Martin Mullen Primary Care Majesti Gambrell: [name] Other Clinician: Referring Aneesah Hernan: Treating Kamryn Messineo/Extender: Donette Larry in Treatment: 0 Activities of Daily Living Items Answer Activities of Daily Living (Please select one for each item) Drive Automobile Not Able T Medications ake Need Assistance Use T elephone Completely Able Care for Appearance Completely Able Use T oilet Completely Able Bath / Shower Completely Able Dress Self Completely Able Feed Self Completely Able Walk Completely Able Get In / Out Bed Completely Able Housework Completely Able Prepare Meals Completely Able Handle Money Need Assistance Shop for Self Completely JERIMYAH, WILDERMUTH ([id number]) [phone] [id number] Page 2 of 5 Electronic Signature(s) Signed: 08/22/2022 4:57:58 PM By: Midge Aver MSN RN CNS WTA Entered By: Midge Aver on 08/22/2022  16:57:58 -------------------------------------------------------------------------------- Education Screening Details Patient Name: Date of Service: Martin Mullen, Martin TTY L. 08/22/2022 2:00 PM Medical Record Number: [id number] Patient Account Number: [id number] Date of Birth/Sex: Treating RN: 1980-12-05 (42 y.o. Martin Mullen Primary Care Joshawn Crissman: [name] Other Clinician: Referring Larin Weissberg: Treating Bora Bost/Extender: Donette Larry in Treatment: 0 Learning Preferences/Education Level/Primary Language Learning Preference: Explanation, Demonstration Highest Education Level: Grade School Preferred Language: English Cognitive Barrier Language Barrier: No Translator Needed: No Memory Deficit: No Emotional Barrier: No Cultural/Religious Beliefs Affecting Medical Care: No Physical Barrier Impaired Vision: No Impaired Hearing: No Decreased Hand dexterity: No Knowledge/Comprehension Knowledge Level: High Comprehension Level: High Ability to understand written instructions: High Ability to understand verbal instructions: High Motivation Anxiety Level: Calm Cooperation: Cooperative Education Importance: Acknowledges Need Interest in Health Problems: Asks Questions Perception: Coherent Willingness to Engage in Self-Management High Activities: Readiness to Engage in Self-Management High Activities: Electronic Signature(s) Signed: 08/22/2022 4:58:06 PM By: Midge Aver MSN RN CNS WTA Entered By: Midge Aver on 08/22/2022 16:58:06 Mayo Ao ([id number]) [phone] [id number] Page 3 of 5 -------------------------------------------------------------------------------- Fall Risk Assessment Details Patient Name: Date of Service: O LIV Mullen, Martin TTY L. 08/22/2022 2:00 PM Medical Record Number: [id number] Patient Account Number: [id number] Date of Birth/Sex: Treating RN: 12/08/1980 (42 y.o. Martin Mullen Primary Care Merlene Dante: [name]  Other Clinician: Referring Jakiyah Stepney: Treating Tenoch Mcclure/Extender: Donette Larry in Treatment: 0 Fall Risk Assessment Items Have you had 2 or more falls in the last 12 monthso 0 Yes Have you had any fall that resulted in injury in the last 12 monthso 0 No FALLS RISK SCREEN History of falling - immediate or within 3 months 0 No Secondary diagnosis (Do you have 2 or more medical diagnoseso) 0 No Ambulatory aid None/bed rest/wheelchair/nurse 0 No Crutches/cane/walker 0 No Furniture 0 No Intravenous therapy Access/Saline/Heparin Lock 0 No Gait/Transferring Normal/ bed rest/ wheelchair 0 No Weak (short steps with or without shuffle, stooped but  able to lift head while walking, may seek 0 No support from furniture) Impaired (short steps with shuffle, may have difficulty arising from chair, head down, impaired 0 No balance) Mental Status Oriented to own ability 0 Yes Electronic Signature(s) Signed: 08/22/2022 4:58:11 PM By: Midge Aver MSN RN CNS WTA Entered By: Midge Aver on 08/22/2022 16:58:11 -------------------------------------------------------------------------------- Foot Assessment Details Patient Name: Date of Service: Martin Mullen, Martin TTY L. 08/22/2022 2:00 PM Medical Record Number: [id number] Patient Account Number: [id number] Date of Birth/Sex: Treating RN: 11/09/1980 (42 y.o. Martin Mullen Primary Care Genette Huertas: [name] Other Clinician: Referring Haze Antillon: Treating Danaysia Rader/Extender: Donette Larry in Treatment: 0 Foot Assessment Items Site Locations [place], [place] ([id number]) [id number] Page 4 of 5 + = Sensation present, - = Sensation absent, C = Callus, U = Ulcer R = Redness, W = Warmth, M = Maceration, PU = Pre-ulcerative lesion F = Fissure, S = Swelling, D = Dryness Assessment Right: Left: Other Deformity: No No Prior Foot Ulcer: Yes No Prior Amputation: No No Charcot Joint: No  No Ambulatory Status: Ambulatory Without Help Gait: Unsteady Electronic Signature(s) Signed: 08/22/2022 4:58:22 PM By: Midge Aver MSN RN CNS WTA Entered By: Midge Aver on 08/22/2022 16:58:22 -------------------------------------------------------------------------------- Nutrition Risk Screening Details Patient Name: Date of Service: Martin Mullen, Martin TTY L. 08/22/2022 2:00 PM Medical Record Number: [id number] Patient Account Number: [id number] Date of Birth/Sex: Treating RN: 29-May-1980 (42 y.o. Martin Mullen Primary Care Adelaide Pfefferkorn: [name] Other Clinician: Referring Tatayana Beshears: Treating Sherrian Nunnelley/Extender: Donette Larry in Treatment: 0 Height (in): Weight (lbs): Body Mass Index (BMI): Nutrition Risk Screening Items Score Screening NUTRITION RISK SCREEN: I have an illness or condition that made me change the kind and/or amount of food I eat 0 No I eat fewer than two meals per day 0 No I eat few fruits and vegetables, or milk products 0 No I have three or more drinks of beer, liquor or wine almost every day 0 No I have tooth or mouth problems that make it hard for me to eat 0 No I don't always have enough money to buy the food I need 0 No TIEGAN, LUEBKE ([id number]) [phone] [id number] Page 5 of 5 I eat alone most of the time 0 No I take three or more different prescribed or over-the-counter drugs a day 1 Yes Without wanting to, I have lost or gained 10 pounds in the last six months 0 No I am not always physically able to shop, cook and/or feed myself 0 No Nutrition Protocols Good Risk Protocol 0 No interventions needed Moderate Risk Protocol High Risk Proctocol Risk Level: Good Risk Score: 1 Electronic Signature(s) Signed: 08/22/2022 4:58:17 PM By: Midge Aver MSN RN CNS WTA Entered By: Midge Aver on 08/22/2022 16:58:16

## 2022-08-22 NOTE — Telephone Encounter (Signed)
Tubat advised.    Thanks,   -Vernona Rieger

## 2022-08-22 NOTE — Telephone Encounter (Signed)
Ok for verbal orders as requested 

## 2022-08-24 NOTE — Progress Notes (Signed)
FAUST, Mullen ([id number]) [id number] Page 1 of 10 Visit Report for 08/22/2022 Allergy List Details Patient Name: Date of Service: Martin Mullen ER, SCO TTY Mullen. 08/22/2022 2:00 PM Medical Record Number: [id number] Patient Account Number: [id number] Date of Birth/Sex: Treating RN: [birth year] (42 y.o. Martin Cluck Primary Care Brown Dunlap: [name] Other Clinician: Referring Ji Feldner: Treating Brynnley Dayrit/Extender: Baldemar Friday Weeks in Treatment: 0 Allergies Active Allergies No Known Drug Allergies Allergy Notes Electronic Signature(s) Signed: 08/22/2022 4:56:43 PM By: Midge Aver MSN RN CNS WTA Entered By: Midge Aver on 08/22/2022 16:56:43 -------------------------------------------------------------------------------- Arrival Information Details Patient Name: Date of Service: Martin Mullen ER, SCO TTY Mullen. 08/22/2022 2:00 PM Medical Record Number: [id number] Patient Account Number: [id number] Date of Birth/Sex: Treating RN: 1980/09/18 (42 y.o. Martin Cluck Primary Care Tyreon Frigon: [name] Other Clinician: Referring Bradon Fester: Treating Rexanne Inocencio/Extender: Donette Larry in Treatment: 0 Visit Information Patient Arrived: Ambulatory Arrival Time: 14:18 Accompanied By: self Transfer Assistance: None Patient Identification Verified: Yes Secondary Verification Process Completed: Yes Patient Requires Transmission-Based Precautions: No Patient Has Alerts: Yes Patient Alerts: Patient on Blood Thinner Eliquis Diabetic Type 2 DACARI, Martin Mullen ([id number]) History Since Last Visit All ordered tests and consults were completed: No Added or deleted any medications: No Any new allergies or adverse reactions: No Had a fall or experienced change in activities of daily living that may affect risk of falls: No Signs or symptoms of abuse/neglect since last visito No Hospitalized since last visit: No Implantable device outside of the clinic excluding  cellular tissue based products placed in the center since last visit: No Has Dressing in Place as Prescribed: Yes Pain Present Now: No Electronic Signature(s) Signed: 08/22/2022 4:56:16 PM By: Midge Aver MSN RN CNS WTA Entered By: Midge Aver on 08/22/2022 16:56:16 -------------------------------------------------------------------------------- Clinic Level of Care Assessment Details Patient Name: Date of Service: Martin Mullen ER, SCO TTY Mullen. 08/22/2022 2:00 PM Medical Record Number: [id number] Patient Account Number: [id number] Date of Birth/Sex: Treating RN: [birth year] (42 y.o. Martin Cluck Primary Care Stephana Morell: [name] Other Clinician: Referring Shontia Gillooly: Treating Jahrell Hamor/Extender: Donette Larry in Treatment: 0 Clinic Level of Care Assessment Items TOOL 1 Quantity Score X- 1 0 Use when EandM and Procedure is performed on INITIAL visit ASSESSMENTS - Nursing Assessment / Reassessment X- 1 20 General Physical Exam (combine w/ comprehensive assessment (listed just below) when performed on new pt. evals) X- 1 25 Comprehensive Assessment (HX, ROS, Risk Assessments, Wounds Hx, etc.) ASSESSMENTS - Wound and Skin Assessment / Reassessment []  - 0 Dermatologic / Skin Assessment (not related to wound area) ASSESSMENTS - Ostomy and/or Continence Assessment and Care []  - 0 Incontinence Assessment and Management []  - 0 Ostomy Care Assessment and Management (repouching, etc.) PROCESS - Coordination of Care []  - 0 Simple Patient / Family Education for ongoing care X- 1 20 Complex (extensive) Patient / Family Education for ongoing care X- 1 10 Staff obtains Chiropractor, Records, T Results / Process Orders est []  - 0 Staff telephones HHA, Nursing Homes / Clarify orders / etc []  - 0 Routine Transfer to another Facility (non-emergent condition) []  - 0 Routine Hospital Admission (non-emergent condition) X- 1 15 New Admissions / Manufacturing engineer / Ordering NPWT  Apligraf, etc. , []  - 0 Emergency Hospital Admission (emergent condition) PROCESS - Special Needs []  - 0 Pediatric / Minor Patient Management []  - 0 Isolation Patient Management []  - 0 Hearing / Language / Visual special needs []  - 0 Assessment of Community  assistance (transportation, D/C planning, etc.) []  - 0 Additional assistance / Altered mentation []  - 0 Support Surface(s) Assessment (bed, cushion, seat, etc.) INTERVENTIONS - Miscellaneous []  - 0 External ear exam []  - 0 Patient Transfer (multiple staff / [employer] / Similar devices) Martin Mullen, Martin Mullen ([id number]) [phone] Page 3 of 10 []  - 0 Simple Staple / Suture removal (25 or less) []  - 0 Complex Staple / Suture removal (26 or more) []  - 0 Hypo/Hyperglycemic Management (do not check if billed separately) X- 1 15 Ankle / Brachial Index (ABI) - do not check if billed separately Has the patient been seen at the hospital within the last three years: Yes Total Score: 105 Level Of Care: New/Established - Level 3 Electronic Signature(s) Signed: 08/22/2022 5:27:27 PM By: Midge Aver MSN RN CNS WTA Entered By: Midge Aver on 08/22/2022 17:02:31 -------------------------------------------------------------------------------- Encounter Discharge Information Details Patient Name: Date of Service: Martin Mullen ER, SCO TTY Mullen. 08/22/2022 2:00 PM Medical Record Number: [id number] Patient Account Number: [id number] Date of Birth/Sex: Treating RN: 1980-04-04 (42 y.o. Martin Cluck Primary Care Estel Scholze: [name] Other Clinician: Referring Cadince Hilscher: Treating Keilany Burnette/Extender: Donette Larry in Treatment: 0 Encounter Discharge Information Items Post Procedure Vitals Discharge Condition: Stable Temperature (F): 97.8 Ambulatory Status: Ambulatory Pulse (bpm): 101 Discharge Destination: Home Respiratory Rate (breaths/min): 18 Transportation: Private Auto Blood Pressure (mmHg):  110/78 Accompanied By: self Schedule Follow-up Appointment: Yes Clinical Summary of Care: Electronic Signature(s) Signed: 08/22/2022 5:06:53 PM By: Midge Aver MSN RN CNS WTA Entered By: Midge Aver on 08/22/2022 17:06:53 -------------------------------------------------------------------------------- Lower Extremity Assessment Details Patient Name: Date of Service: Martin Mullen ER, SCO TTY Mullen. 08/22/2022 2:00 PM Medical Record Number: [id number] Patient Account Number: [id number] Date of Birth/Sex: Treating RN: [birth year] (42 y.o. Martin Cluck Primary Care Jakyron Fabro: [name] Other Clinician: Referring Messina Kosinski: Treating Safiyah Cisney/Extender: Donette Larry in Treatment: 0 Edema Assessment Left: [Left: Right] Franne Forts: :] Assessed: [Left: No] [Right: No] Edema: [Left: No] [Right: No] Calf Left: Right: Point of Measurement: 35 cm From Medial Instep 32.2 cm 32 cm Ankle Left: Right: Point of Measurement: 12 cm From Medial Instep 21 cm 21.8 cm Knee To Floor Left: Right: From Medial Instep 40.5 cm 40.56 cm Vascular Assessment Pulses: Dorsalis Pedis Palpable: [Left:Yes] [Right:Yes] Popliteal Doppler Audible: [Left:Yes] [Right:Yes] Blood Pressure: Brachial: [Left:110] [Right:110] Ankle: [Left:Dorsalis Pedis: 98 0.89] [Right:Dorsalis Pedis: 60 0.55] Electronic Signature(s) Signed: 08/22/2022 4:56:32 PM By: Midge Aver MSN RN CNS WTA Entered By: Midge Aver on 08/22/2022 16:56:32 -------------------------------------------------------------------------------- Multi Wound Chart Details Patient Name: Date of Service: Martin Mullen ER, SCO TTY Mullen. 08/22/2022 2:00 PM Medical Record Number: [id number] Patient Account Number: [id number] Date of Birth/Sex: Treating RN: 09/05/1980 (42 y.o. Martin Cluck Primary Care Adlai Nieblas: [name] Other Clinician: Referring Bearett Porcaro: Treating Djibril Glogowski/Extender: Donette Larry in Treatment: 0 Vital  Signs Height(in): Pulse(bpm): 101 Weight(lbs): Blood Pressure(mmHg): 110/78 Body Mass Index(BMI): Temperature(F): 97.8 Respiratory Rate(breaths/min): 18 [2:Photos:] [N/A:N/A] RAD, GRAMLING ([id number]) [2:Right, Midline T Second Wound Location: Surgical Injury Wounding Event: Dehisced Wound Primary Etiology: Coronary Artery Disease, Comorbid History: Hypertension, Myocardial Infarction, Type II Diabetes 06/30/2022 Date Acquired: 0 Weeks of Treatment:  Open Wound Status: No Wound Recurrence: Yes Pending A mputation on Presentation: 3.7x0.7x3.6 Measurements Mullen x W x D (cm) 2.034 A (cm) : rea 7.323 Volume (cm) : Full Thickness With Exposed Support Grade 1 Classification: Structures Medium Exudate A mount:  Serosanguineous Exudate Type: red, brown Exudate Color: Medium (34-66%) Granulation A mount:  Red, Pink Granulation Quality: None Present (0%) Necrotic A mount: Fat Layer (Subcutaneous Tissue): Yes Fat Layer (Subcutaneous Tissue): Yes N/A Exposed  Structures: Bone: Yes Fascia: No Tendon: No Muscle: No Joint: No N/A Epithelialization: Debridement - Excisional Debridement: Pre-procedure Verification/Time Out 16:26 Taken: Lidocaine 4% Topical Solution Pain Control: Callus, Subcutaneous, Slough Tissue  Debrided: Skin/Subcutaneous Tissue Level: 2.03 Debridement A (sq cm): rea Curette Instrument: Minimum Bleeding: Pressure Hemostasis A chieved: 0 Procedural Pain: 0 Post Procedural Pain: Procedure was tolerated well Debridement Treatment Response:  3.7x0.7x3.6 Post Debridement Measurements Mullen x W x D (cm) 7.323 Post Debridement Volume: (cm) Debridement Procedures Performed:] [3:oe Left, Lateral Foot Pressure Injury Diabetic Wound/Ulcer of the Lower Extremity Coronary Artery Disease, Hypertension,  Myocardial Infarction, Type II Diabetes 04/13/2022 0 Open No Yes 0.2x0.2x0.6 0.031 0.019 Medium Serosanguineous red, brown None Present (0%) N/A Medium (34-66%) Fascia: No Tendon: No Muscle: No Joint: No Bone: No None  Debridement - Excisional 16:26  Lidocaine 4% Topical Solution Callus, Subcutaneous, Slough Skin/Subcutaneous Tissue 0.03 Curette Minimum Pressure 0 0 Procedure was tolerated well 0.2x0.2x0.6 0.019 Debridement] [N/[id number] Page 5 of 10 N/A N/A N/A N/A N/A  N/A N/A N/A N/A N/A N/A N/A N/A N/A N/A N/A N/A N/A N/A N/A N/A N/A N/A N/A N/A N/A N/A N/A N/A N/A N/A N/A N/A N/A N/A] Treatment Notes Electronic Signature(s) Signed: 08/22/2022 5:05:45 PM By: Midge Aver MSN RN CNS WTA Previous Signature: 08/22/2022 4:59:38 PM Version By: Midge Aver MSN RN CNS WTA Entered By: Midge Aver on 08/22/2022 17:05:44 -------------------------------------------------------------------------------- Multi-Disciplinary Care Plan Details Patient Name: Date of Service: Martin Mullen ER, SCO TTY Mullen. 08/22/2022 2:00 PM Medical Record Number: [id number] Patient Account Number: [id number] Date of Birth/Sex: Treating RN: 15-Jun-1980 (42 y.o. Martin Cluck Primary Care Eartha Vonbehren: [name] Other Clinician: Referring Yessenia Maillet: Treating Cova Knieriem/Extender: Donette Larry in Treatment: 0 Martin Mullen, Martin Mullen ([id number]) [id number] Page 6 of 10 Active Inactive Orientation to the Wound Care Program Nursing Diagnoses: Knowledge deficit related to the wound healing center program Goals: Patient/caregiver will verbalize understanding of the Wound Healing Center Program Date Initiated: 08/22/2022 Target Resolution Date: 09/05/2022 Goal Status: Active Interventions: Provide education on orientation to the wound center Notes: Wound/Skin Impairment Nursing Diagnoses: Impaired tissue integrity Knowledge deficit related to smoking impact on wound healing Knowledge deficit related to ulceration/compromised skin integrity Goals: Patient will demonstrate a reduced rate of smoking or cessation of smoking Date Initiated: 08/22/2022 Target Resolution Date: 09/22/2022 Goal  Status: Active Patient/caregiver will verbalize understanding of skin care regimen Date Initiated: 08/22/2022 Target Resolution Date: 09/22/2022 Goal Status: Active Ulcer/skin breakdown will have a volume reduction of 30% by week 4 Date Initiated: 08/22/2022 Target Resolution Date: 09/22/2022 Goal Status: Active Ulcer/skin breakdown will have a volume reduction of 50% by week 8 Date Initiated: 08/22/2022 Target Resolution Date: 10/23/2022 Goal Status: Active Ulcer/skin breakdown will have a volume reduction of 80% by week 12 Date Initiated: 08/22/2022 Target Resolution Date: 11/22/2022 Goal Status: Active Ulcer/skin breakdown will heal within 14 weeks Date Initiated: 08/22/2022 Target Resolution Date: 12/06/2022 Goal Status: Active Interventions: Assess patient/caregiver ability to obtain necessary supplies Assess patient/caregiver ability to perform ulcer/skin care regimen upon admission and as needed Assess ulceration(s) every visit Provide education on smoking Provide education on ulcer and skin care Treatment Activities: Patient referred to home care : 08/22/2022 Skin care regimen initiated : 08/22/2022 Notes: Electronic Signature(s) Signed: 08/22/2022 5:04:32 PM By: Midge Aver MSN RN CNS WTA Previous Signature: 08/22/2022 5:04:25 PM Version By: Midge Aver  MSN RN CNS WTA Entered By: Midge Aver on 08/22/2022 17:04:32 Martin Mullen ([id number]) [id number] Page 7 of 10 -------------------------------------------------------------------------------- Pain Assessment Details Patient Name: Date of Service: Martin Mullen ER, SCO TTY Mullen. 08/22/2022 2:00 PM Medical Record Number: [id number] Patient Account Number: [id number] Date of Birth/Sex: Treating RN: [birth year] (42 y.o. Martin Cluck Primary Care Saleema Weppler: [name] Other Clinician: Referring Vega Withrow: Treating Amadi Frady/Extender: Donette Larry in Treatment: 0 Active Problems Location of Pain  Severity and Description of Pain Patient Has Paino No Site Locations Pain Management and Medication Current Pain Management: Electronic Signature(s) Signed: 08/22/2022 4:56:21 PM By: Midge Aver MSN RN CNS WTA Entered By: Midge Aver on 08/22/2022 16:56:21 -------------------------------------------------------------------------------- Patient/Caregiver Education Details Patient Name: Date of Service: Martin Mullen ER, SCO TTY Mullen. 7/8/2024andnbsp2:00 PM Medical Record Number: [id number] Patient Account Number: [id number] Date of Birth/Gender: Treating RN: 12/20/1980 (42 y.o. Martin Cluck Primary Care Physician: [name] Other Clinician: Referring Physician: Treating Physician/Extender: Donette Larry in Treatment: 0 Education Assessment Education Provided To: Patient Education Topics Provided Offloading: Handouts: How Offloading Helps Foot Wounds Heal Methods: Explain/Verbal Responses: State content correctly Martin Mullen, Martin Mullen ([id number]) [id number] Page 8 of 10 Smoking and Wound Healing: Handouts: Smoking and Wound Healing Methods: Explain/Verbal Responses: State content correctly Wound Debridement: Handouts: Wound Debridement Methods: Explain/Verbal Responses: State content correctly Wound/Skin Impairment: Handouts: Caring for Your Ulcer Methods: Explain/Verbal Responses: State content correctly Electronic Signature(s) Signed: 08/22/2022 5:27:27 PM By: Midge Aver MSN RN CNS WTA Entered By: Midge Aver on 08/22/2022 17:05:19 -------------------------------------------------------------------------------- Wound Assessment Details Patient Name: Date of Service: Martin Mullen ER, SCO TTY Mullen. 08/22/2022 2:00 PM Medical Record Number: [id number] Patient Account Number: [id number] Date of Birth/Sex: Treating RN: 1980-02-19 (42 y.o. Martin Cluck Primary Care Elsworth Ledin: [name] Other Clinician: Referring Jaselle Pryer: Treating  Taisley Mordan/Extender: Donette Larry in Treatment: 0 Wound Status Wound Number: 2 Primary Dehisced Wound Etiology: Wound Location: Right, Midline T Second oe Wound Open Wounding Event: Surgical Injury Status: Date Acquired: 06/30/2022 Comorbid Coronary Artery Disease, Hypertension, Myocardial Infarction, Weeks Of Treatment: 0 History: Type II Diabetes Clustered Wound: No Photos Wound Measurements Length: (cm) 3.7 Width: (cm) 0.7 Depth: (cm) 3.6 Area: (cm) 2.034 Volume: (cm) 7.323 % Reduction in Area: % Reduction in Volume: Tunneling: No Undermining: No Wound Description Martin Mullen, Martin Mullen ([id number]) Classification: Full Thickness With Exposed Support Structures Exudate Amount: Medium Exudate Type: Serosanguineous Exudate Color: red, brown [phone].pdf Page 9 of 10 Foul Odor After Cleansing: No Slough/Fibrino No Wound Bed Granulation Amount: Medium (34-66%) Exposed Structure Granulation Quality: Red, Pink Fascia Exposed: No Necrotic Amount: None Present (0%) Fat Layer (Subcutaneous Tissue) Exposed: Yes Tendon Exposed: No Muscle Exposed: No Joint Exposed: No Bone Exposed: Yes Electronic Signature(s) Signed: 08/22/2022 5:27:27 PM By: Midge Aver MSN RN CNS WTA Entered By: Midge Aver on 08/22/2022 15:32:20 -------------------------------------------------------------------------------- Wound Assessment Details Patient Name: Date of Service: Martin Mullen ER, SCO TTY Mullen. 08/22/2022 2:00 PM Medical Record Number: [id number] Patient Account Number: [id number] Date of Birth/Sex: Treating RN: October 27, 1980 (42 y.o. Martin Cluck Primary Care Giani Winther: [name] Other Clinician: Referring Fatima Fedie: Treating Latarshia Jersey/Extender: Donette Larry in Treatment: 0 Wound Status Wound Number: 3 Primary Diabetic Wound/Ulcer of the Lower Extremity Etiology: Wound Location: Left, Lateral Foot Wound Open Wounding Event: Pressure  Injury Status: Date Acquired: 04/13/2022 Comorbid Coronary Artery Disease, Hypertension, Myocardial Infarction, Weeks Of Treatment: 0 History: Type II Diabetes Clustered Wound: No Photos Wound Measurements Length: (cm) 0.2 Width: (  cm) 0.2 Depth: (cm) 0.6 Area: (cm) 0.031 Volume: (cm) 0.019 % Reduction in Area: % Reduction in Volume: Epithelialization: None Tunneling: No Undermining: No Wound Description Classification: Grade 1 Exudate Amount: Medium Martin Mullen, Martin Mullen ([id number]) Exudate Type: Serosanguineous Exudate Color: red, brown Foul Odor After Cleansing: No Slough/Fibrino No [phone].pdf Page 10 of 10 Wound Bed Granulation Amount: None Present (0%) Exposed Structure Necrotic Amount: Medium (34-66%) Fascia Exposed: No Fat Layer (Subcutaneous Tissue) Exposed: Yes Tendon Exposed: No Muscle Exposed: No Joint Exposed: No Bone Exposed: No Electronic Signature(s) Signed: 08/22/2022 5:27:27 PM By: Midge Aver MSN RN CNS WTA Entered By: Midge Aver on 08/22/2022 15:29:22 -------------------------------------------------------------------------------- Vitals Details Patient Name: Date of Service: Martin Mullen ER, SCO TTY Mullen. 08/22/2022 2:00 PM Medical Record Number: [id number] Patient Account Number: [id number] Date of Birth/Sex: Treating RN: 1980-08-30 (42 y.o. Martin Cluck Primary Care Jerret Mcbane: [name] Other Clinician: Referring Loretta Kluender: Treating Chue Berkovich/Extender: Donette Larry in Treatment: 0 Vital Signs Time Taken: 15:20 Temperature (F): 97.8 Pulse (bpm): 101 Respiratory Rate (breaths/min): 18 Blood Pressure (mmHg): 110/78 Reference Range: 80 - 120 mg / dl Electronic Signature(s) Signed: 08/22/2022 4:56:25 PM By: Midge Aver MSN RN CNS WTA Entered By: Midge Aver on 08/22/2022 16:56:24

## 2022-08-26 NOTE — Progress Notes (Signed)
JASTIN, CLUTTS ([id number]) [id number] Page 1 of 13 Visit Report for 08/22/2022 Chief Complaint Document Details Patient Name: Date of Service: Martin Mullen ER, [name] 08/22/2022 2:00 PM Medical Record Number: [id number] Patient Account Number: [id number] Date of Birth/Sex: Treating RN: [birth year] (42 y.o. Roel Cluck Primary Care Provider: [name] Other Clinician: Referring Provider: Treating Provider/Extender: Donette Larry in Treatment: 0 Information Obtained from: Patient Chief Complaint Bilateral foot ulcers Electronic Signature(s) Signed: 08/22/2022 5:05:54 PM By: Midge Aver MSN RN CNS WTA Signed: 08/23/2022 7:04:22 PM By: Allen Derry PA-C Previous Signature: 08/22/2022 2:54:27 PM Version By: Allen Derry PA-C Entered By: Midge Aver on 08/22/2022 17:05:53 -------------------------------------------------------------------------------- Debridement Details Patient Name: Date of Service: Martin Mullen ER, [name] 08/22/2022 2:00 PM Medical Record Number: [id number] Patient Account Number: [id number] Date of Birth/Sex: Treating RN: 08-04-1980 (42 y.o. Roel Cluck Primary Care Provider: [name] Other Clinician: Referring Provider: Treating Provider/Extender: Donette Larry in Treatment: 0 Debridement Performed for Assessment: Wound #3 Left,Lateral Foot Performed By: Physician Allen Derry, PA-C Debridement Type: Debridement Severity of Tissue Pre Debridement: Fat layer exposed Level of Consciousness (Pre-procedure): Awake and Alert Pre-procedure Verification/Time Out Yes - 16:26 Taken: Start Time: 16:26 Pain Control: Lidocaine 4% T opical Solution Percent of Wound Bed Debrided: 100% T Area Debrided (cm): otal 0.03 Tissue and other material debrided: Viable, Non-Viable, Callus, Slough, Subcutaneous, Slough Level: Skin/Subcutaneous Tissue Debridement Description: Excisional Instrument: Curette Bleeding:  Minimum Hemostasis Achieved: Pressure DEVONTA, BOHREN ([id number]) [id number] Page 2 of 13 Procedural Pain: 0 Post Procedural Pain: 0 Response to Treatment: Procedure was tolerated well Level of Consciousness (Post- Awake and Alert procedure): Post Debridement Measurements of Total Wound Length: (cm) 0.2 Width: (cm) 0.2 Depth: (cm) 0.6 Volume: (cm) 0.019 Character of Wound/Ulcer Post Debridement: Stable Severity of Tissue Post Debridement: Fat layer exposed Post Procedure Diagnosis Same as Pre-procedure Electronic Signature(s) Signed: 08/22/2022 5:05:34 PM By: Allen Derry PA-C Signed: 08/22/2022 5:27:27 PM By: Midge Aver MSN RN CNS WTA Entered By: Midge Aver on 08/22/2022 15:27:50 -------------------------------------------------------------------------------- Debridement Details Patient Name: Date of Service: Martin Mullen ER, [name] 08/22/2022 2:00 PM Medical Record Number: [id number] Patient Account Number: [id number] Date of Birth/Sex: Treating RN: Feb 19, 1980 (42 y.o. Roel Cluck Primary Care Provider: [name] Other Clinician: Referring Provider: Treating Provider/Extender: Donette Larry in Treatment: 0 Debridement Performed for Assessment: Wound #2 Right,Midline T Second oe Performed By: Physician Allen Derry, PA-C Debridement Type: Debridement Level of Consciousness (Pre-procedure): Awake and Alert Pre-procedure Verification/Time Out Yes - 16:26 Taken: Start Time: 16:26 Pain Control: Lidocaine 4% T opical Solution Percent of Wound Bed Debrided: 100% T Area Debrided (cm): otal 2.03 Tissue and other material debrided: Viable, Non-Viable, Callus, Slough, Subcutaneous, Slough Level: Skin/Subcutaneous Tissue Debridement Description: Excisional Instrument: Curette Bleeding: Minimum Hemostasis Achieved: Pressure Procedural Pain: 0 Post Procedural Pain: 0 Response to Treatment: Procedure was tolerated well Level of  Consciousness (Post- Awake and Alert procedure): Post Debridement Measurements of Total Wound Length: (cm) 3.7 Width: (cm) 0.7 Depth: (cm) 3.6 Volume: (cm) 7.323 Character of Wound/Ulcer Post Debridement: Stable Post Procedure Diagnosis Same as Pre-procedure KHASIR, PEHL ([id number]) [id number] Page 3 of 13 Electronic Signature(s) Signed: 08/22/2022 5:05:34 PM By: Allen Derry PA-C Signed: 08/22/2022 5:27:27 PM By: Midge Aver MSN RN CNS WTA Entered By: Midge Aver on 08/22/2022 15:33:55 -------------------------------------------------------------------------------- HPI Details Patient Name: Date of Service: Martin Mullen ER, [name] 08/22/2022 2:00 PM Medical Record Number: [id number] Patient  Account Number: [id number] Date of Birth/Sex: Treating RN: [birth year] (42 y.o. Roel Cluck Primary Care Provider: [name] Other Clinician: Referring Provider: Treating Provider/Extender: Donette Larry in Treatment: 0 History of Present Illness HPI Description: 10/11/18 patient presents today for initial evaluation our clinic. He is being seen due to a left great toe ulcer which unfortunately has been present for quite some time. Initially he thought it really wasn't much of anything and then subsequently noted that he had some drainage then that seem to dry off and he didn't worry too much about it until more recently when he is had issues. 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 Subsequently he tells me he has not utilized any recreational drugs for two years. This is obviously excellent news. He also does have diabetes type II. It does sound like he is trying to improve things as far as his overall health status is concerned although his hemoglobin A-1 C as measured on 09/27/18 was 10.7. He did have an x-ray as well on 09/28/18 and  this was negative for any evidence of acute or chronic osteomyelitis or fracture. Patient's wound is not appear to be severely infected at this time which is good news. With that being said he also doesn't really have any pain this is secondary to neuropathy. No fevers, chills, nausea, or vomiting noted at this time. 10/18/2018 on evaluation today patient actually appears to be doing better with regard to his toe ulcer. He has been tolerating the dressing changes without complication. Fortunately there is no signs of active infection at this time. Overall very pleased in this regard and how things are doing. Patient likewise is pleased and happy to see that things are doing much better. Still he does have some ways to go to get this area to completely close. 10/30/2018 on evaluation today patient's ulcer on his plantar foot actually appears to be doing quite well at this time. He has been tolerating the dressing changes without complication which is excellent news. With that being said I do believe he is making progress there is some callus buildup on without the clear away today but other than that I am seeing evidence that this seems to be filling in from the base and is getting much smaller in the base. Still were not at the point of complete closure yet. 11/09/2018 on evaluation today patient actually appears to be doing quite well with regard to his toe ulcer from the standpoint of infection. I do not see any signs of active infection at this time which is good news. With that being said he unfortunately is not really making much progress either he continues to have depth of the wound despite the fact that it seems to be callused over initially on inspection when I probed and actually remove some of the callus it is much deeper. Fortunately there is no signs of active infection at this time. 11/16/2018 on evaluation today patient's toe ulcer actually appears to be doing about the same. There is really  no significant improvement compared to last time I saw him. There is some debridement necessary today he has callus covering the wound area I think he may be a good candidate for total contact cast to rather rapidly get this area to close. We previously have discussed this although we have not initiated it yet I think it  may be time to do so and go ahead and get this closed. 11/22/2018 on evaluation today patient appears to be doing better with regard to the overall size of his wound. He has been tolerating the dressing changes without complication. Fortunately there is no signs of active infection at this time. No fevers, chills, nausea, vomiting, or diarrhea. 11/29/2018 on evaluation today patient appears to be doing about the same really with regard to his toe ulcer. He is not showing any signs of dramatic improvement unfortunately. There is no signs of infection at this time. No fevers, chills, nausea, vomiting, or diarrhea. I really think we need to consider the total contact cast at this point in order to go ahead and get this closed. 12/04/2018 on evaluation today patient appears to be doing about the same with regard to his great toe ulcer. He is here today for the initiation of the total contact cast. Unfortunately he notes he did clip his toenail somewhat short on the second toe of the same foot he does have a slight paronychia noted at this point. There is some erythema is not spreading into the foot if just in the distal portion of the toe there is a little bit of evidence of fluid/pus buildup at the toenail margin. Nonetheless this is something that I definitely think we probably should treat just to make sure nothing gets worse while we apply the cast he still wants to apply the cast today we will be seeing him back in 2 days anyway to reapply the cast so I do not see any problems in that regard. 12/06/2018 on evaluation today patient appears to be measuring smaller with regard to his  wound. The total contact cast just in 2 days seems to have already helped he is here for repeat application today. Fortunately there is no signs of active infection at this time. No fevers, chills, nausea, vomiting, or diarrhea. He did unfortunately get the cast wet this morning taking a shower he attempted to use a trash bag and it failed. I believe he should get a cast protector which would be better as far as helping to avoid this from happening again. 10/29; the patient had a raised surface eschar over the wound area. Gently debrided with a #5 curette. The vast majority of this had closed however he had 1 small open area that still had perhaps 2 mm of depth. With considerable difficulty I was able to convince him to put the cast on for another few days. The option would have been a Pegasys shoe. 12/18/2018 upon evaluation today patient appears to be doing very well with regard to his wound on the toe. In fact this appears to be completely healed which is great news. There is no evidence of active infection at this time which is also good news. No fevers, chills, nausea, vomiting, or diarrhea. Overall the patient is very pleased with how things seem to have progressed. STAMATIOS, BRIDGER ([id number]) [id number] Page 4 of 13 Readmission: 08-22-2022 this is a gentleman whom I have seen previously. In fact it was last in November 2020 that I saw him. Since that time apparently there is quite a bit that has occurred in fact he has had 2 strokes as well as before I knew he had had a heart attack but unfortunately the strokes have altered his capacity as far as both the mental side is concerned with speech as well as even remembering things and it also affected his motor function  especially in his hands. His walk is also not as good as it was. Nonetheless he is seen today as a referral from Dr. Allena Katz at [facility]. Dr. Allena Katz did do surgery on this gentleman after he had  revascularization and May on the 15th of 2024. Subsequently after the revascularization and stent placement by Dr. Wyn Quaker Dr. 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 Unfortunately it did not remain closed and dehisced presumably due to the fact that the patient states he was walking on it. He really does not have any evidence at this point of systemic infection which is good news but unfortunately the wound is doing quite poorly. He also has a big area of callus on the left foot this can require debridement today there may be a wound under this as well but appear. Electronic Signature(s) Signed: 08/22/2022 5:02:26 PM By: Allen Derry PA-C Entered By: Allen Derry on 08/22/2022 17:02:26 -------------------------------------------------------------------------------- Physical Exam Details Patient Name: Date of Service: Martin Mullen ER, [name] 08/22/2022 2:00 PM Medical Record Number: [id number] Patient Account Number: [id number] Date of Birth/Sex: Treating RN: [birth year] (42 y.o. Roel Cluck Primary Care Provider: [name] Other Clinician: Referring Provider: Treating Provider/Extender: Donette Larry in Treatment: 0 Constitutional sitting or standing blood pressure is within target range for patient.. pulse regular and within target range for patient.Marland Kitchen respirations regular, non-labored and within target range for patient.Marland Kitchen temperature within target range for patient.. Well-nourished and well-hydrated in no acute distress. Eyes conjunctiva clear no eyelid edema noted. pupils equal round and reactive to light and accommodation. Ears, Nose, Mouth, and Throat no gross abnormality of ear auricles or external auditory canals. normal hearing noted during conversation. mucus membranes moist. Respiratory normal breathing without difficulty. Musculoskeletal unsteady while walking. no  significant deformity or arthritic changes, no loss or range of motion, no clubbing. Psychiatric this patient is able to make decisions and demonstrates good insight into disease process. Alert and Oriented x 3. pleasant and cooperative. Notes Upon inspection patient's wounds did require sharp debridement on both locations. I do believe that he is going to probably have a good chance that healing the left wound without too much complication on the foot. With that being said the wound on the right foot unfortunately I think is going to be a bit more trouble. This is quite deep and again the way it dehisced surgically this is going to be very challenging as far as healing is concerned. I am also unsure even with this left wound how well it is going to do with him not staying off of it that is my biggest concern at this point is his blood flow and the fact that he really is not able to properly care for himself it would appear he lives with a friend at this point he has very little home support. Electronic Signature(s) Signed: 08/22/2022 5:03:50 PM By: Allen Derry PA-C Entered By: Allen Derry on 08/22/2022 17:03:50 Mayo Ao ([id number]) [id number] Page 5 of 13 -------------------------------------------------------------------------------- Physician Orders Details Patient Name: Date of Service: Martin Mullen ER, [name] 08/22/2022 2:00 PM Medical Record Number: [id number] Patient Account Number: [id number] Date of Birth/Sex: Treating RN: [birth year] (42 y.o. Roel Cluck Primary Care Provider: [name] Other Clinician: Referring Provider: Treating Provider/Extender: Donette Larry in Treatment: 0 Verbal / Phone Orders: No Diagnosis Coding ICD-10 Coding Code Description  E11.621 Type 2 diabetes mellitus with foot ulcer L97.522 Non-pressure chronic ulcer of other part of left foot with fat layer exposed T81.31XA Disruption of external operation  (surgical) wound, not elsewhere classified, initial encounter L97.512 Non-pressure chronic ulcer of other part of right foot with fat layer exposed I10 Essential (primary) hypertension I25.10 Atherosclerotic heart disease of native coronary artery without angina pectoris Follow-up Appointments ppointment in 1 week. - Late afternoon appt Mondays Return A Home Health CONTINUE Home Health for wound care. May utilize formulary equivalent dressing for wound treatment orders unless otherwise specified. Home Health Nurse may visit PRN to address patients wound care needs. - [facility], please see for dressing changes on Wednesday and Friday. Was previously getting PT through [facility] but they have no nurses for wound care. Frances [facility]urbish is discharging the patient. Bathing/ [employer] wounds with antibacterial soap and water. May shower; gently cleanse wound with antibacterial soap, rinse and pat dry prior to dressing wounds No tub bath. Anesthetic (Use 'Patient Medications' Section for Anesthetic Order Entry) Lidocaine applied to wound bed Off-Loading Open toe surgical shoe - Bilateral. Wound Treatment Wound #2 - T Second oe Wound Laterality: Right, Midline Cleanser: Soap and Water (Home Health) 3 x Per Week/30 Days Discharge Instructions: Gently cleanse wound with antibacterial soap, rinse and pat dry prior to dressing wounds Cleanser: Vashe 5.8 (oz) (Home Health) 3 x Per Week/30 Days Discharge Instructions: Use vashe 5.8 (oz) as directed Prim Dressing: Hydrofera Blue Ready Transfer Foam, 2.5x2.5 (in/in) (Home Health) 3 x Per Week/30 Days ary Discharge Instructions: Apply Hydrofera Blue Ready to wound bed as directed Secondary Dressing: Conforming Guaze Roll-Medium 3 x Per Week/30 Days Discharge Instructions: Apply Conforming Stretch Guaze Bandage as directed Secured With: Medipore T - 62M Medipore H Soft Cloth Surgical T ape ape, 2x2 (in/yd) 3 x Per Week/30 Days Wound #3 - Foot Wound  Laterality: Left, Lateral Cleanser: Soap and Water (Home Health) 3 x Per Week/30 Days Discharge Instructions: Gently cleanse wound with antibacterial soap, rinse and pat dry prior to dressing wounds Cleanser: Vashe 5.8 (oz) (Home Health) 3 x Per Week/30 Days Discharge Instructions: Use vashe 5.8 (oz) as directed KIERCE, CEFALO ([id number]) [id number] Page 6 of 13 Prim Dressing: Hydrofera Blue Ready Transfer Foam, 2.5x2.5 (in/in) (Home Health) 3 x Per Week/30 Days ary Discharge Instructions: Apply Hydrofera Blue Ready to wound bed as directed Secondary Dressing: ABD Pad 5x9 (in/in) (Home Health) 3 x Per Week/30 Days Discharge Instructions: Cover with ABD pad Secured With: Medipore T - 62M Medipore H Soft Cloth Surgical T ape ape, 2x2 (in/yd) (Home Health) 3 x Per Week/30 Days Secured With: Conform 4'' - Conforming Stretch Gauze Bandage 4x75 (in/in) (Home Health) 3 x Per Week/30 Days Discharge Instructions: Apply as directed Consults Vascular Laboratory Hemoglobin A (glycated HgB)/Hemoglobin.total in Blood (CHEM) 1c LOINC Code: 4548-4 Convenience Name: Hb A1c -Method not specified Electronic Signature(s) Signed: 08/23/2022 7:04:22 PM By: Allen Derry PA-C Signed: 08/25/2022 5:16:05 PM By: Midge Aver MSN RN CNS WTA Previous Signature: 08/22/2022 5:27:27 PM Version By: Midge Aver MSN RN CNS WTA Previous Signature: 08/22/2022 5:05:34 PM Version By: Allen Derry PA-C Entered By: Midge Aver on 08/23/2022 13:16:00 -------------------------------------------------------------------------------- Problem List Details Patient Name: Date of Service: Martin Mullen ER, [name] 08/22/2022 2:00 PM Medical Record Number: [id number] Patient Account Number: [id number] Date of Birth/Sex: Treating RN: 1980-07-08 (42 y.o. Roel Cluck Primary Care Provider: [name] Other Clinician: Referring Provider: Treating Provider/Extender: Donette Larry in Treatment:  0 Active Problems ICD-10 Encounter Code Description Active Date MDM Diagnosis E11.621 Type 2 diabetes mellitus with foot ulcer 08/22/2022 No Yes L97.522 Non-pressure chronic ulcer of other part of left foot with fat layer exposed 08/22/2022 No Yes T81.31XA Disruption of external operation (surgical) wound, not elsewhere classified, 08/22/2022 No Yes initial encounter L97.512 Non-pressure chronic ulcer of other part of right foot with fat layer exposed 08/22/2022 No Yes JASIYAH, DIVELY ([id number]) [id number] Page 7 of 13 I10 Essential (primary) hypertension 08/22/2022 No Yes I25.10 Atherosclerotic heart disease of native coronary artery without angina pectoris 08/22/2022 No Yes Inactive Problems Resolved Problems Electronic Signature(s) Signed: 08/22/2022 5:05:36 PM By: Midge Aver MSN RN CNS WTA Signed: 08/22/2022 5:05:42 PM By: Allen Derry PA-C Previous Signature: 08/22/2022 2:54:14 PM Version By: Allen Derry PA-C Previous Signature: 08/22/2022 2:53:25 PM Version By: Allen Derry PA-C Previous Signature: 08/22/2022 2:34:57 PM Version By: Allen Derry PA-C Entered By: Midge Aver on 08/22/2022 17:05:36 -------------------------------------------------------------------------------- Progress Note Details Patient Name: Date of Service: Martin Mullen ER, [name] 08/22/2022 2:00 PM Medical Record Number: [id number] Patient Account Number: [id number] Date of Birth/Sex: Treating RN: 1980/06/01 (42 y.o. Roel Cluck Primary Care Provider: [name] Other Clinician: Referring Provider: Treating Provider/Extender: Donette Larry in Treatment: 0 Subjective Chief Complaint Information obtained from Patient Bilateral foot ulcers History of Present Illness (HPI) 10/11/18 patient presents today for initial evaluation our clinic. He is being seen due to a left great toe ulcer which unfortunately has been present for quite some time. Initially he thought it really wasn't  much of anything and then subsequently noted that he had some drainage then that seem to dry off and he didn't worry too much about it until more recently when he is had issues. 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 Subsequently he tells me he has not utilized any recreational drugs for two years. This is obviously excellent news. He also does have diabetes type II. It does sound like he is trying to improve things as far as his overall health status is concerned although his hemoglobin A-1 C as measured on 09/27/18 was 10.7. He did have an x-ray as well on 09/28/18 and this was negative for any evidence of acute or chronic osteomyelitis or fracture. Patient's wound is not appear to be severely infected at this time which is good news. With that being said he also doesn't really have any pain this is secondary to neuropathy. No fevers, chills, nausea, or vomiting noted at this time. 10/18/2018 on evaluation today patient actually appears to be doing better with regard to his toe ulcer. He has been tolerating the dressing changes without complication. Fortunately there is no signs of active infection at this time. Overall very pleased in this regard and how things are doing. Patient likewise is pleased and happy to see that things are doing much better. Still he does have some ways to go to get this area to completely close. 10/30/2018 on evaluation today patient's ulcer on his plantar foot actually appears to be doing quite well at this time. He has been tolerating the dressing changes without complication which is excellent news. With that being said I do believe he is making progress there is some callus buildup on without the clear away today but other than that I am seeing evidence that this seems to be filling in from  the base and is getting much smaller in the base. Still were not at  the point of complete closure yet. 11/09/2018 on evaluation today patient actually appears to be doing quite well with regard to his toe ulcer from the standpoint of infection. I do not see any signs of active infection at this time which is good news. With that being said he unfortunately is not really making much progress either he continues to have depth of the wound despite the fact that it seems to be callused over initially on inspection when I probed and actually remove some of the callus it is much deeper. Fortunately there is no signs of active infection at this time. 11/16/2018 on evaluation today patient's toe ulcer actually appears to be doing about the same. There is really no significant improvement compared to last time I saw him. There is some debridement necessary today he has callus covering the wound area I think he may be a good candidate for total contact cast to rather rapidly get this area to close. We previously have discussed this although we have not initiated it yet I think it may be time to do so and go ahead and get this closed. 11/22/2018 on evaluation today patient appears to be doing better with regard to the overall size of his wound. He has been tolerating the dressing changes without complication. Fortunately there is no signs of active infection at this time. No fevers, chills, nausea, vomiting, or diarrhea. EDUAR, COUZENS ([id number]) [id number] Page 8 of 13 11/29/2018 on evaluation today patient appears to be doing about the same really with regard to his toe ulcer. He is not showing any signs of dramatic improvement unfortunately. There is no signs of infection at this time. No fevers, chills, nausea, vomiting, or diarrhea. I really think we need to consider the total contact cast at this point in order to go ahead and get this closed. 12/04/2018 on evaluation today patient appears to be doing about the same with regard to his great  toe ulcer. He is here today for the initiation of the total contact cast. Unfortunately he notes he did clip his toenail somewhat short on the second toe of the same foot he does have a slight paronychia noted at this point. There is some erythema is not spreading into the foot if just in the distal portion of the toe there is a little bit of evidence of fluid/pus buildup at the toenail margin. Nonetheless this is something that I definitely think we probably should treat just to make sure nothing gets worse while we apply the cast he still wants to apply the cast today we will be seeing him back in 2 days anyway to reapply the cast so I do not see any problems in that regard. 12/06/2018 on evaluation today patient appears to be measuring smaller with regard to his wound. The total contact cast just in 2 days seems to have already helped he is here for repeat application today. Fortunately there is no signs of active infection at this time. No fevers, chills, nausea, vomiting, or diarrhea. He did unfortunately get the cast wet this morning taking a shower he attempted to use a trash bag and it failed. I believe he should get a cast protector which would be better as far as helping to avoid this from happening again. 10/29; the patient had a raised surface eschar over the wound area. Gently debrided with a #5 curette. The vast majority of this  had closed however he had 1 small open area that still had perhaps 2 mm of depth. With considerable difficulty I was able to convince him to put the cast on for another few days. The option would have been a Pegasys shoe. 12/18/2018 upon evaluation today patient appears to be doing very well with regard to his wound on the toe. In fact this appears to be completely healed which is great news. There is no evidence of active infection at this time which is also good news. No fevers, chills, nausea, vomiting, or diarrhea. Overall the patient is very pleased with how  things seem to have progressed. Readmission: 08-22-2022 this is a gentleman whom I have seen previously. In fact it was last in November 2020 that I saw him. Since that time apparently there is quite a bit that has occurred in fact he has had 2 strokes as well as before I knew he had had a heart attack but unfortunately the strokes have altered his capacity as far as both the mental side is concerned with speech as well as even remembering things and it also affected his motor function especially in his hands. His walk is also not as good as it was. Nonetheless he is seen today as a referral from Dr. Allena Katz at [facility]. Dr. Allena Katz did do surgery on this gentleman after he had revascularization and May on the 15th of 2024. Subsequently after the revascularization and stent placement by Dr. Wyn Quaker Dr. 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 Unfortunately it did not remain closed and dehisced presumably due to the fact that the patient states he was walking on it. He really does not have any evidence at this point of systemic infection which is good news but unfortunately the wound is doing quite poorly. He also has a big area of callus on the left foot this can require debridement today there may be a wound under this as well but appear. Patient History Information obtained from Patient. Allergies No Known Drug Allergies Family History Diabetes - Mother,Father,Paternal Grandparents,Maternal Grandparents, No family history of Cancer, Heart Disease, Hereditary Spherocytosis, Hypertension, Kidney Disease, Lung Disease, Seizures, Stroke, Thyroid Problems, Tuberculosis. Social History Current every day smoker, Marital Status - Single, Alcohol Use - Rarely, Drug Use - Prior History, Caffeine Use - Daily. Medical History Cardiovascular Patient has history of Coronary Artery Disease, Hypertension,  Myocardial Infarction Denies history of Angina, Arrhythmia, Congestive Heart Failure, Deep Vein Thrombosis, Hypotension, Peripheral Arterial Disease, Peripheral Venous Disease, Phlebitis, Vasculitis Endocrine Patient has history of Type II Diabetes Denies history of Type I Diabetes Integumentary (Skin) Denies history of History of Burn, History of pressure wounds Medical A Surgical History Notes nd Cardiovascular HLD Neurologic CVA 8/23 Review of Systems (ROS) Constitutional Symptoms (General Health) Denies complaints or symptoms of Fatigue, Fever, Chills, Marked Weight Change. Eyes Denies complaints or symptoms of Dry Eyes, Vision Changes, Glasses / Contacts. Ear/Nose/Mouth/Throat Denies complaints or symptoms of Difficult clearing ears, Sinusitis. Hematologic/Lymphatic Denies complaints or symptoms of Bleeding / Clotting Disorders, Human Immunodeficiency Virus. Respiratory Denies complaints or symptoms of Chronic or frequent coughs, Shortness of Breath. Integumentary (Skin) Denies complaints or symptoms of Wounds. Musculoskeletal Denies complaints or symptoms of Muscle Pain, Muscle Weakness. Neurologic Denies complaints or symptoms of Numbness/parasthesias, Focal/Weakness. Psychiatric Denies complaints or symptoms of Anxiety, Claustrophobia. GREGORY, KOLLMANN ([id number]) [id number] Page 9 of 13 Objective Constitutional sitting or standing  blood pressure is within target range for patient.. pulse regular and within target range for patient.Marland Kitchen respirations regular, non-labored and within target range for patient.Marland Kitchen temperature within target range for patient.. Well-nourished and well-hydrated in no acute distress. Vitals Time Taken: 3:20 PM, Temperature: 97.8 F, Pulse: 101 bpm, Respiratory Rate: 18 breaths/min, Blood Pressure: 110/78 mmHg. Eyes conjunctiva clear no eyelid edema noted. pupils equal round and reactive to light and accommodation. Ears,  Nose, Mouth, and Throat no gross abnormality of ear auricles or external auditory canals. normal hearing noted during conversation. mucus membranes moist. Respiratory normal breathing without difficulty. Musculoskeletal unsteady while walking. no significant deformity or arthritic changes, no loss or range of motion, no clubbing. Psychiatric this patient is able to make decisions and demonstrates good insight into disease process. Alert and Oriented x 3. pleasant and cooperative. General Notes: Upon inspection patient's wounds did require sharp debridement on both locations. I do believe that he is going to probably have a good chance that healing the left wound without too much complication on the foot. With that being said the wound on the right foot unfortunately I think is going to be a bit more trouble. This is quite deep and again the way it dehisced surgically this is going to be very challenging as far as healing is concerned. I am also unsure even with this left wound how well it is going to do with him not staying off of it that is my biggest concern at this point is his blood flow and the fact that he really is not able to properly care for himself it would appear he lives with a friend at this point he has very little home support. Integumentary (Hair, Skin) Wound #2 status is Open. Original cause of wound was Surgical Injury. The date acquired was: 06/30/2022. The wound is located on the Right,Midline Toe Second. The wound measures 3.7cm length x 0.7cm width x 3.6cm depth; 2.034cm^2 area and 7.323cm^3 volume. There is bone and Fat Layer (Subcutaneous Tissue) exposed. There is no tunneling or undermining noted. There is a medium amount of serosanguineous drainage noted. There is medium (34-66%) red, pink granulation within the wound bed. There is no necrotic tissue within the wound bed. Wound #3 status is Open. Original cause of wound was Pressure Injury. The date acquired was:  04/13/2022. The wound is located on the Left,Lateral Foot. The wound measures 0.2cm length x 0.2cm width x 0.6cm depth; 0.031cm^2 area and 0.019cm^3 volume. There is Fat Layer (Subcutaneous Tissue) exposed. There is no tunneling or undermining noted. There is a medium amount of serosanguineous drainage noted. There is no granulation within the wound bed. There is a medium (34-66%) amount of necrotic tissue within the wound bed. Assessment Active Problems ICD-10 Type 2 diabetes mellitus with foot ulcer Non-pressure chronic ulcer of other part of left foot with fat layer exposed Disruption of external operation (surgical) wound, not elsewhere classified, initial encounter Non-pressure chronic ulcer of other part of right foot with fat layer exposed Essential (primary) hypertension Atherosclerotic heart disease of native coronary artery without angina pectoris Procedures Wound #2 Pre-procedure diagnosis of Wound #2 is a Dehisced Wound located on the Right,Midline T Second . There was a Excisional Skin/Subcutaneous Tissue oe Debridement with a total area of 2.03 sq cm performed by Allen Derry, PA-C. With the following instrument(s): Curette to remove Viable and Non-Viable tissue/material. Material removed includes Callus, Subcutaneous Tissue, and Slough after achieving pain control using Lidocaine 4% T opical Solution. No specimens  were taken. A time out was conducted at 16:26, prior to the start of the procedure. A Minimum amount of bleeding was controlled with Pressure. The procedure was tolerated well with a pain level of 0 throughout and a pain level of 0 following the procedure. Post Debridement Measurements: 3.7cm length x 0.7cm width x 3.6cm depth; 7.323cm^3 volume. Character of Wound/Ulcer Post Debridement is stable. Post procedure Diagnosis Wound #2: Same as Pre-Procedure Wound #3 Pre-procedure diagnosis of Wound #3 is a Diabetic Wound/Ulcer of the Lower Extremity located on the  Left,Lateral Foot .Severity of Tissue Pre Debridement is: Fat layer exposed. There was a Excisional Skin/Subcutaneous Tissue Debridement with a total area of 0.03 sq cm performed by Allen Derry, PA-C. With the following instrument(s): Curette to remove Viable and Non-Viable tissue/material. Material removed includes Callus, Subcutaneous Tissue, and Slough after achieving pain control using Lidocaine 4% T opical Solution. No specimens were taken. A time out was conducted at 16:26, prior to the start of the procedure. A Minimum amount of bleeding was controlled with Pressure. The procedure was tolerated well with a pain level of 0 throughout and a pain level of 0 following the procedure. Post Debridement Measurements: 0.2cm length x 0.2cm width x 0.6cm depth; 0.019cm^3 volume. Character of Wound/Ulcer Post Debridement is stable. Severity of Tissue Post Debridement is: Fat layer exposed. Post procedure Diagnosis Wound #3: Same as Pre-Procedure SUHAAN, JAQUES ([id number]) [id number] Page 10 of 13 Plan Follow-up Appointments: Return Appointment in 1 week. - Late afternoon appt Mondays Home Health: [facility] for wound care. May utilize formulary equivalent dressing for wound treatment orders unless otherwise specified. Home Health Nurse may visit PRN to address patients wound care needs. Frances Furbish Fax [phone], please see for dressing changes on Wednesday and Friday Bathing/ Shower/ Hygiene: Wash wounds with antibacterial soap and water. May shower; gently cleanse wound with antibacterial soap, rinse and pat dry prior to dressing wounds No tub bath. Anesthetic (Use 'Patient Medications' Section for Anesthetic Order Entry): Lidocaine applied to wound bed Off-Loading: Open toe surgical shoe - Bilateral. Consults ordered were: Vascular WOUND #2: - T Second oe Wound Laterality: Right, Midline Cleanser: Soap and Water (Home Health) 3 x Per Week/30  Days Discharge Instructions: Gently cleanse wound with antibacterial soap, rinse and pat dry prior to dressing wounds Cleanser: Vashe 5.8 (oz) (Home Health) 3 x Per Week/30 Days Discharge Instructions: Use vashe 5.8 (oz) as directed Prim Dressing: Hydrofera Blue Ready Transfer Foam, 2.5x2.5 (in/in) (Home Health) 3 x Per Week/30 Days ary Discharge Instructions: Apply Hydrofera Blue Ready to wound bed as directed Secondary Dressing: Conforming Guaze Roll-Medium 3 x Per Week/30 Days Discharge Instructions: Apply Conforming Stretch Guaze Bandage as directed Secured With: Medipore T - 72M Medipore H Soft Cloth Surgical T ape ape, 2x2 (in/yd) 3 x Per Week/30 Days WOUND #3: - Foot Wound Laterality: Left, Lateral Cleanser: Soap and Water (Home Health) 3 x Per Week/30 Days Discharge Instructions: Gently cleanse wound with antibacterial soap, rinse and pat dry prior to dressing wounds Cleanser: Vashe 5.8 (oz) (Home Health) 3 x Per Week/30 Days Discharge Instructions: Use vashe 5.8 (oz) as directed Prim Dressing: Hydrofera Blue Ready Transfer Foam, 2.5x2.5 (in/in) (Home Health) 3 x Per Week/30 Days ary Discharge Instructions: Apply Hydrofera Blue Ready to wound bed as directed Secondary Dressing: ABD Pad 5x9 (in/in) (Home Health) 3 x Per Week/30 Days Discharge Instructions: Cover with ABD pad Secured With: Medipore T - 72M Medipore H Soft Cloth Surgical T ape ape,  2x2 (in/yd) (Home Health) 3 x Per Week/30 Days Secured With: Conform 4'' - Conforming Stretch Gauze Bandage 4x75 (in/in) (Home Health) 3 x Per Week/30 Days Discharge Instructions: Apply as directed 1. Based on what I am seeing I do believe that the patient would benefit at this time from going ahead and initiating therapy with a recommendation for offloading he really should be try to limit his walking is much as possible. I also think he needs to not be wearing any of his current shoes I think that postop shoes would be best. 2. I am then  recommend as well that the patient should continue with cleaning this area with Vashe or something similar. Also think it would be beneficial for him to have Hydrofera Blue for both locations and I would recommend that along with the [facility] that he does need to be changing this 3 times per week he has home health which is a blessing in this case because as I said before he really does not have much of a home support structure. The home health nurse hopefully should be a great benefit for him at this point. We will see patient back for reevaluation in 1 week here in the clinic. If anything worsens or changes patient will contact our office for additional recommendations. Electronic Signature(s) Signed: 08/22/2022 5:04:35 PM By: Allen Derry PA-C Entered By: Allen Derry on 08/22/2022 17:04:35 -------------------------------------------------------------------------------- ROS/PFSH Details Patient Name: Date of Service: Martin Mullen ER, [name] 08/22/2022 2:00 PM Medical Record Number: [id number] Patient Account Number: [id number] Date of Birth/Sex: Treating RN: 1980/09/23 (42 y.o. Roel Cluck Primary Care Provider: [name] Other Clinician: Referring Provider: Treating Provider/Extender: Nikolaj, Auger ([id number]) [phone].pdf Page 11 of 13 Weeks in Treatment: 0 Information Obtained From Patient Constitutional Symptoms (General Health) Complaints and Symptoms: Negative for: Fatigue; Fever; Chills; Marked Weight Change Eyes Complaints and Symptoms: Negative for: Dry Eyes; Vision Changes; Glasses / Contacts Ear/Nose/Mouth/Throat Complaints and Symptoms: Negative for: Difficult clearing ears; Sinusitis Hematologic/Lymphatic Complaints and Symptoms: Negative for: Bleeding / Clotting Disorders; Human Immunodeficiency Virus Respiratory Complaints and Symptoms: Negative for: Chronic or frequent coughs; Shortness of  Breath Integumentary (Skin) Complaints and Symptoms: Negative for: Wounds Medical History: Negative for: History of Burn; History of pressure wounds Musculoskeletal Complaints and Symptoms: Negative for: Muscle Pain; Muscle Weakness Neurologic Complaints and Symptoms: Negative for: Numbness/parasthesias; Focal/Weakness Medical History: Past Medical History Notes: CVA 8/23 Psychiatric Complaints and Symptoms: Negative for: Anxiety; Claustrophobia Cardiovascular Medical History: Positive for: Coronary Artery Disease; Hypertension; Myocardial Infarction Negative for: Angina; Arrhythmia; Congestive Heart Failure; Deep Vein Thrombosis; Hypotension; Peripheral Arterial Disease; Peripheral Venous Disease; Phlebitis; Vasculitis Past Medical History Notes: HLD Endocrine Medical History: Positive for: Type II Diabetes Negative for: Type I Diabetes Treated with: Oral agents Oncologic Immunizations Pneumococcal Vaccine: CAINAN, ESTEVES ([id number]) [id number] Page 12 of 13 Received Pneumococcal Vaccination: Yes Received Pneumococcal Vaccination On or After 60th Birthday: No Implantable Devices None Family and Social History Cancer: No; Diabetes: Yes - Mother,Father,Paternal Grandparents,Maternal Grandparents; Heart Disease: No; Hereditary Spherocytosis: No; Hypertension: No; Kidney Disease: No; Lung Disease: No; Seizures: No; Stroke: No; Thyroid Problems: No; Tuberculosis: No; Current every day smoker; Marital Status - Single; Alcohol Use: Rarely; Drug Use: Prior History; Caffeine Use: Daily; Financial Concerns: No; Food, Clothing or Shelter Needs: No; Support System Lacking: No; Transportation Concerns: No Electronic Signature(s) Signed: 08/22/2022 5:05:34 PM By: Allen Derry PA-C Signed: 08/22/2022 5:27:27 PM By: Midge Aver MSN RN CNS WTA Entered By:  Midge Aver on 08/22/2022  16:57:34 -------------------------------------------------------------------------------- SuperBill Details Patient Name: Date of Service: Martin Mullen ER, SCO TTY Elbert Ewings 08/22/2022 Medical Record Number: [id number] Patient Account Number: [id number] Date of Birth/Sex: Treating RN: January 07, 1981 (42 y.o. Roel Cluck Primary Care Provider: [name] Other Clinician: Referring Provider: Treating Provider/Extender: Donette Larry in Treatment: 0 Diagnosis Coding ICD-10 Codes Code Description [phone] Type 2 diabetes mellitus with foot ulcer L97.522 Non-pressure chronic ulcer of other part of left foot with fat layer exposed T81.31XA Disruption of external operation (surgical) wound, not elsewhere classified, initial encounter L97.512 Non-pressure chronic ulcer of other part of right foot with fat layer exposed I10 Essential (primary) hypertension I25.10 Atherosclerotic heart disease of native coronary artery without angina pectoris Facility Procedures : CPT4 Code: 08657846 Description: 99213 - WOUND CARE VISIT-LEV 3 EST PT Modifier: Quantity: 1 : CPT4 Code: 96295284 Description: 11042 - DEB SUBQ TISSUE 20 SQ CM/< ICD-10 Diagnosis Description L97.522 Non-pressure chronic ulcer of other part of left foot with fat layer exposed L97.512 Non-pressure chronic ulcer of other part of right foot with fat layer exposed Modifier: Quantity: 1 Physician Procedures : CPT4 Code Description Modifier [id number] WC PHYS LEVEL 3 NEW PT 25 ICD-10 Diagnosis Description E11.621 Type 2 diabetes mellitus with foot ulcer L97.522 Non-pressure chronic ulcer of other part of left foot with fat layer exposed T81.31XA Disruption of  external operation (surgical) wound, not elsewhere classified, initial encounter L97.512 Non-pressure chronic ulcer of other part of right foot with fat layer exposed Quantity: 1 : [id number] 11042 - WC PHYS SUBQ TISS 20 SQ CM RAWLEIGH, HARPEL ([id number])  [id number] ICD-10 Diagnosis Description L97.522 Non-pressure chronic ulcer of other part of left foot with fat layer exposed L97.512 Non-pressure chronic  ulcer of other part of right foot with fat layer exposed Quantity: 1 .pdf Page 13 of 13 Electronic Signature(s) Signed: 08/22/2022 5:04:57 PM By: Allen Derry PA-C Entered By: Allen Derry on 08/22/2022 17:04:56

## 2022-08-29 ENCOUNTER — Telehealth: Payer: Medicaid Other

## 2022-08-29 ENCOUNTER — Encounter: Payer: Medicaid Other | Admitting: Physician Assistant

## 2022-08-29 ENCOUNTER — Telehealth: Payer: Self-pay | Admitting: Pharmacist

## 2022-08-29 DIAGNOSIS — E11621 Type 2 diabetes mellitus with foot ulcer: Secondary | ICD-10-CM | POA: Diagnosis not present

## 2022-08-29 NOTE — Progress Notes (Addendum)
NIR, DELAGE ([id number]) [id number] Page 1 of 8 Visit Report for 08/29/2022 Chief Complaint Document Details Patient Name: Date of Service: Martin Mullen ER, [name] 08/29/2022 3:15 PM Medical Record Number: [id number] Patient Account Number: [id number] Date of Birth/Sex: Treating RN: April 10, 1980 (42 y.o. [name] Primary Care Provider: [name] Other Clinician: Referring Provider: Treating Provider/Extender: Carron Curie in Treatment: 1 Information Obtained from: Patient Chief Complaint Bilateral foot ulcers Electronic Signature(s) Signed: 08/29/2022 3:18:00 PM By: Allen Derry PA-C Entered By: Allen Derry on 08/29/2022 15:18:00 -------------------------------------------------------------------------------- HPI Details Patient Name: Date of Service: Martin Mullen ER, [name] 08/29/2022 3:15 PM Medical Record Number: [id number] Patient Account Number: [id number] Date of Birth/Sex: Treating RN: 1981/01/06 (42 y.o. [name] Primary Care Provider: [name] Other Clinician: Referring Provider: Treating Provider/Extender: Carron Curie in Treatment: 1 History of Present Illness HPI Description: 10/11/18 patient presents today for initial evaluation our clinic. He is being seen due to a left great toe ulcer which unfortunately has been present for quite some time. Initially he thought it really wasn't much of anything and then subsequently noted that he had some drainage then that seem to dry off and he didn't worry too much about it until more recently when he is had issues. 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 Subsequently he tells me he has not utilized any recreational drugs for two years. This is obviously excellent news. He also does have diabetes type II. It does sound like he is trying to  improve things as far as his overall health status is concerned although his hemoglobin A-1 C as measured on 09/27/18 was 10.7. He did have an x-ray as well on 09/28/18 and this was negative for any evidence of acute or chronic osteomyelitis or fracture. Patient's wound is not appear to be severely infected at this time which is good news. With that being said he also doesn't really have any pain this is secondary to neuropathy. No fevers, chills, nausea, or vomiting noted at this time. 10/18/2018 on evaluation today patient actually appears to be doing better with regard to his toe ulcer. He has been tolerating the dressing changes without complication. Fortunately there is no signs of active infection at this time. Overall very pleased in this regard and how things are doing. Patient likewise is pleased and happy to see that things are doing much better. Still he does have some ways to go to get this area to completely close. 10/30/2018 on evaluation today patient's ulcer on his plantar foot actually appears to be doing quite well at this time. He has been tolerating the dressing changes without complication which is excellent news. With that being said I do believe he is making progress there is some callus buildup on without the clear away today but other than that I am seeing evidence that this seems to be filling in from the base and is getting much smaller in the base. Still were not at the point of complete closure yet. Martin Mullen, Martin Mullen ([id number]) [id number] Page 2 of 8 11/09/2018 on evaluation today patient actually appears to be doing quite well with regard to his toe ulcer from the standpoint of infection. I do not see any signs of active infection at this time which is good news. With that being said he unfortunately is not really making  much progress either he continues to have depth of the wound despite the fact that it seems to be callused over initially on  inspection when I probed and actually remove some of the callus it is much deeper. Fortunately there is no signs of active infection at this time. 11/16/2018 on evaluation today patient's toe ulcer actually appears to be doing about the same. There is really no significant improvement compared to last time I saw him. There is some debridement necessary today he has callus covering the wound area I think he may be a good candidate for total contact cast to rather rapidly get this area to close. We previously have discussed this although we have not initiated it yet I think it may be time to do so and go ahead and get this closed. 11/22/2018 on evaluation today patient appears to be doing better with regard to the overall size of his wound. He has been tolerating the dressing changes without complication. Fortunately there is no signs of active infection at this time. No fevers, chills, nausea, vomiting, or diarrhea. 11/29/2018 on evaluation today patient appears to be doing about the same really with regard to his toe ulcer. He is not showing any signs of dramatic improvement unfortunately. There is no signs of infection at this time. No fevers, chills, nausea, vomiting, or diarrhea. I really think we need to consider the total contact cast at this point in order to go ahead and get this closed. 12/04/2018 on evaluation today patient appears to be doing about the same with regard to his great toe ulcer. He is here today for the initiation of the total contact cast. Unfortunately he notes he did clip his toenail somewhat short on the second toe of the same foot he does have a slight paronychia noted at this point. There is some erythema is not spreading into the foot if just in the distal portion of the toe there is a little bit of evidence of fluid/pus buildup at the toenail margin. Nonetheless this is something that I definitely think we probably should treat just to make sure nothing gets worse while we  apply the cast he still wants to apply the cast today we will be seeing him back in 2 days anyway to reapply the cast so I do not see any problems in that regard. 12/06/2018 on evaluation today patient appears to be measuring smaller with regard to his wound. The total contact cast just in 2 days seems to have already helped he is here for repeat application today. Fortunately there is no signs of active infection at this time. No fevers, chills, nausea, vomiting, or diarrhea. He did unfortunately get the cast wet this morning taking a shower he attempted to use a trash bag and it failed. I believe he should get a cast protector which would be better as far as helping to avoid this from happening again. 10/29; the patient had a raised surface eschar over the wound area. Gently debrided with a #5 curette. The vast majority of this had closed however he had 1 small open area that still had perhaps 2 mm of depth. With considerable difficulty I was able to convince him to put the cast on for another few days. The option would have been a Pegasys shoe. 12/18/2018 upon evaluation today patient appears to be doing very well with regard to his wound on the toe. In fact this appears to be completely healed which is great news. There is no evidence  of active infection at this time which is also good news. No fevers, chills, nausea, vomiting, or diarrhea. Overall the patient is very pleased with how things seem to have progressed. Readmission: 08-22-2022 this is a gentleman whom I have seen previously. In fact it was last in November 2020 that I saw him. Since that time apparently there is quite a bit that has occurred in fact he has had 2 strokes as well as before I knew he had had a heart attack but unfortunately the strokes have altered his capacity as far as both the mental side is concerned with speech as well as even remembering things and it also affected his motor function especially in his hands. His walk  is also not as good as it was. Nonetheless he is seen today as a referral from Dr. Allena Katz at [facility]. Dr. Allena Katz did do surgery on this gentleman after he had revascularization and May on the 15th of 2024. Subsequently after the revascularization and stent placement by Dr. Wyn Quaker Dr. 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 Unfortunately it did not remain closed and dehisced presumably due to the fact that the patient states he was walking on it. He really does not have any evidence at this point of systemic infection which is good news but unfortunately the wound is doing quite poorly. He also has a big area of callus on the left foot this can require debridement today there may be a wound under this as well but appear. 08-29-23 upon evaluation today patient unfortunately appears to be doing not so well in regard to his foot ulcer. Since I last saw him he has not had the dressing changed even 1 time. He had matted hair which apparently is Here in the wound bed which required quite a bit of cleaning to clear out by the nurse prior to my coming in. With that being said I think that if he is not careful he is at high risk for losing his leg if this becomes subsequently infected. I discussed that with him today. Unfortunately he had home health but since we sent him orders for wound care they discharged him stating "they did not have any staff". Again this is something that to be peripherally honest we run into many times with patients with Medicaid or insurance carriers I do not pay well for home health services that we will be told they do not have "staph" when really my assumption is they do not want to take on the patient as they will accept other patients with Medicare otherwise. There is only a small number of Medicaid patients that it will help company will take on. Electronic  Signature(s) Signed: 08/29/2022 5:55:05 PM By: Allen Derry PA-C Previous Signature: 08/29/2022 5:54:51 PM Version By: Allen Derry PA-C Entered By: Allen Derry on 08/29/2022 17:55:05 -------------------------------------------------------------------------------- Physical Exam Details Patient Name: Date of Service: Martin Mullen ER, [name] 08/29/2022 3:15 PM Medical Record Number: [id number] Patient Account Number: [id number] Date of Birth/Sex: Treating RN: 08-31-1980 (42 y.o. [name] Primary Care Provider: [name] Other Clinician: Referring Provider: Treating Provider/Extender: Carron Curie in Treatment: 1 Constitutional Martin Mullen, Martin Mullen ([id number]) [id number] Page 3 of 8 Well-nourished and well-hydrated in no acute distress. Respiratory normal breathing without difficulty. Psychiatric this patient is able to make decisions and demonstrates good insight into disease process. Alert and Oriented x  3. pleasant and cooperative. Notes Upon inspection unfortunately the patient appears to be doing poorly in regard to keeping the wound clean he did not even change it since he was last here and he had cat hair matted in with drainage into the wound which was not good. This puts him at high risk for infection and worsening overall and we discussed this today. With that being said I do believe that he is going to need to be much more careful and I think at this point working to try to bring him in for nurse visits here in the clinic as best we can. Electronic Signature(s) Signed: 08/29/2022 5:55:37 PM By: Allen Derry PA-C Entered By: Allen Derry on 08/29/2022 17:55:37 -------------------------------------------------------------------------------- Physician Orders Details Patient Name: Date of Service: Martin Mullen ER, [name] 08/29/2022 3:15 PM Medical Record Number: [id number] Patient Account Number: [id number] Date of Birth/Sex: Treating  RN: [birth year] (42 y.o. [name] Primary Care Provider: [name] Other Clinician: Referring Provider: Treating Provider/Extender: Carron Curie in Treatment: 1 Verbal / Phone Orders: No Diagnosis Coding ICD-10 Coding Code Description E11.621 Type 2 diabetes mellitus with foot ulcer L97.522 Non-pressure chronic ulcer of other part of left foot with fat layer exposed T81.31XA Disruption of external operation (surgical) wound, not elsewhere classified, initial encounter L97.512 Non-pressure chronic ulcer of other part of right foot with fat layer exposed I10 Essential (primary) hypertension I25.10 Atherosclerotic heart disease of native coronary artery without angina pectoris Follow-up Appointments ppointment in 1 week. - Late afternoon appt Mondays Return A Bathing/ Shower/ Hygiene Wash wounds with antibacterial soap and water. May shower; gently cleanse wound with antibacterial soap, rinse and pat dry prior to dressing wounds No tub bath. Anesthetic (Use 'Patient Medications' Section for Anesthetic Order Entry) Lidocaine applied to wound bed Edema Control - Lymphedema / Segmental Compressive Device / Other Elevate, Exercise Daily and A void Standing for Long Periods of Time. Elevate legs to the level of the heart and pump ankles as often as possible Elevate leg(s) parallel to the floor when sitting. Off-Loading Open toe surgical shoe - Bilateral. Wound Treatment Wound #2 - T Second oe Wound Laterality: Right, Midline Cleanser: Soap and Water [facility]) 3 x Per Week/30 Days Martin Mullen, Martin Mullen ([id number]) [id number] Page 4 of 8 Discharge Instructions: Gently cleanse wound with antibacterial soap, rinse and pat dry prior to dressing wounds Cleanser: Vashe 5.8 (oz) (Home Health) 3 x Per Week/30 Days Discharge Instructions: Use vashe 5.8 (oz) as directed Prim Dressing: Hydrofera Blue Ready Transfer Foam, 2.5x2.5 (in/in)  (Home Health) 3 x Per Week/30 Days ary Discharge Instructions: Apply Hydrofera Blue Ready to wound bed as directed Prim Dressing: Hydrofera Blue Classic Foam Rope Dressing, 9x6 (mm/in) ary 3 x Per Week/30 Days Discharge Instructions: inserted into wound cavity Secondary Dressing: ABD Pad 5x9 (in/in) 3 x Per Week/30 Days Discharge Instructions: Cover with ABD pad Secondary Dressing: Gauze 3 x Per Week/30 Days Discharge Instructions: As directed: dry, moistened with saline or moistened with Dakins Solution Secured With: Medipore T - 27M Medipore H Soft Cloth Surgical T ape ape, 2x2 (in/yd) 3 x Per Week/30 Days Electronic Signature(s) Signed: 08/30/2022 5:59:11 PM By: Allen Derry PA-C Signed: 09/01/2022 3:42:23 PM By: Yevonne Pax RN Entered By: Yevonne Pax on 08/29/2022 17:05:50 -------------------------------------------------------------------------------- Problem List Details Patient Name: Date of Service: Martin Mullen ER, [name] 08/29/2022 3:15 PM Medical Record Number: [id number] Patient Account Number: [id number] Date of Birth/Sex: Treating RN: 1981-02-12 (42 y.o. M)  Yevonne Pax Primary Care Provider: [name] Other Clinician: Referring Provider: Treating Provider/Extender: Carron Curie in Treatment: 1 Active Problems ICD-10 Encounter Code Description Active Date MDM Diagnosis E11.621 Type 2 diabetes mellitus with foot ulcer 08/22/2022 No Yes L97.522 Non-pressure chronic ulcer of other part of left foot with fat layer exposed 08/22/2022 No Yes T81.31XA Disruption of external operation (surgical) wound, not elsewhere classified, 08/22/2022 No Yes initial encounter L97.512 Non-pressure chronic ulcer of other part of right foot with fat layer exposed 08/22/2022 No Yes I10 Essential (primary) hypertension 08/22/2022 No Yes I25.10 Atherosclerotic heart disease of native coronary artery without angina pectoris 08/22/2022 No Yes Martin Mullen, Martin Mullen ([id number])  [id number] Page 5 of 8 Inactive Problems Resolved Problems Electronic Signature(s) Signed: 08/29/2022 3:17:56 PM By: Allen Derry PA-C Entered By: Allen Derry on 08/29/2022 15:17:56 -------------------------------------------------------------------------------- Progress Note Details Patient Name: Date of Service: Martin Mullen ER, [name] 08/29/2022 3:15 PM Medical Record Number: [id number] Patient Account Number: [id number] Date of Birth/Sex: Treating RN: 1980-10-13 (42 y.o. [name] Primary Care Provider: [name] Other Clinician: Referring Provider: Treating Provider/Extender: Carron Curie in Treatment: 1 Subjective Chief Complaint Information obtained from Patient Bilateral foot ulcers History of Present Illness (HPI) 10/11/18 patient presents today for initial evaluation our clinic. He is being seen due to a left great toe ulcer which unfortunately has been present for quite some time. Initially he thought it really wasn't much of anything and then subsequently noted that he had some drainage then that seem to dry off and he didn't worry too much about it until more recently when he is had issues. 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 Subsequently he tells me he has not utilized any recreational drugs for two years. This is obviously excellent news. He also does have diabetes type II. It does sound like he is trying to improve things as far as his overall health status is concerned although his hemoglobin A-1 C as measured on 09/27/18 was 10.7. He did have an x-ray as well on 09/28/18 and this was negative for any evidence of acute or chronic osteomyelitis or fracture. Patient's wound is not appear to be severely infected at this time which is good news. With that being said he also doesn't really have any pain this is  secondary to neuropathy. No fevers, chills, nausea, or vomiting noted at this time. 10/18/2018 on evaluation today patient actually appears to be doing better with regard to his toe ulcer. He has been tolerating the dressing changes without complication. Fortunately there is no signs of active infection at this time. Overall very pleased in this regard and how things are doing. Patient likewise is pleased and happy to see that things are doing much better. Still he does have some ways to go to get this area to completely close. 10/30/2018 on evaluation today patient's ulcer on his plantar foot actually appears to be doing quite well at this time. He has been tolerating the dressing changes without complication which is excellent news. With that being said I do believe he is making progress there is some callus buildup on without the clear away today but other than that I am seeing evidence that this seems to be filling in from the base and is getting much smaller in the base. Still were not at the point of complete closure yet.  11/09/2018 on evaluation today patient actually appears to be doing quite well with regard to his toe ulcer from the standpoint of infection. I do not see any signs of active infection at this time which is good news. With that being said he unfortunately is not really making much progress either he continues to have depth of the wound despite the fact that it seems to be callused over initially on inspection when I probed and actually remove some of the callus it is much deeper. Fortunately there is no signs of active infection at this time. 11/16/2018 on evaluation today patient's toe ulcer actually appears to be doing about the same. There is really no significant improvement compared to last time I saw him. There is some debridement necessary today he has callus covering the wound area I think he may be a good candidate for total contact cast to rather rapidly get this area to  close. We previously have discussed this although we have not initiated it yet I think it may be time to do so and go ahead and get this closed. 11/22/2018 on evaluation today patient appears to be doing better with regard to the overall size of his wound. He has been tolerating the dressing changes without complication. Fortunately there is no signs of active infection at this time. No fevers, chills, nausea, vomiting, or diarrhea. 11/29/2018 on evaluation today patient appears to be doing about the same really with regard to his toe ulcer. He is not showing any signs of dramatic improvement unfortunately. There is no signs of infection at this time. No fevers, chills, nausea, vomiting, or diarrhea. I really think we need to consider the total contact cast at this point in order to go ahead and get this closed. 12/04/2018 on evaluation today patient appears to be doing about the same with regard to his great toe ulcer. He is here today for the initiation of the total contact cast. Unfortunately he notes he did clip his toenail somewhat short on the second toe of the same foot he does have a slight paronychia noted at this point. There is some erythema is not spreading into the foot if just in the distal portion of the toe there is a little bit of evidence of fluid/pus buildup at the toenail margin. Nonetheless this is something that I definitely think we probably should treat just to make sure nothing gets worse while we apply the cast he still wants to apply the cast today we will be seeing him back in 2 days anyway to reapply the cast so I do not see any problems in that regard. Martin Mullen, Martin Mullen ([id number]) [id number] Page 6 of 8 12/06/2018 on evaluation today patient appears to be measuring smaller with regard to his wound. The total contact cast just in 2 days seems to have already helped he is here for repeat application today. Fortunately there is no signs of active  infection at this time. No fevers, chills, nausea, vomiting, or diarrhea. He did unfortunately get the cast wet this morning taking a shower he attempted to use a trash bag and it failed. I believe he should get a cast protector which would be better as far as helping to avoid this from happening again. 10/29; the patient had a raised surface eschar over the wound area. Gently debrided with a #5 curette. The vast majority of this had closed however he had 1 small open area that still had perhaps 2 mm of depth. With considerable difficulty  I was able to convince him to put the cast on for another few days. The option would have been a Pegasys shoe. 12/18/2018 upon evaluation today patient appears to be doing very well with regard to his wound on the toe. In fact this appears to be completely healed which is great news. There is no evidence of active infection at this time which is also good news. No fevers, chills, nausea, vomiting, or diarrhea. Overall the patient is very pleased with how things seem to have progressed. Readmission: 08-22-2022 this is a gentleman whom I have seen previously. In fact it was last in November 2020 that I saw him. Since that time apparently there is quite a bit that has occurred in fact he has had 2 strokes as well as before I knew he had had a heart attack but unfortunately the strokes have altered his capacity as far as both the mental side is concerned with speech as well as even remembering things and it also affected his motor function especially in his hands. His walk is also not as good as it was. Nonetheless he is seen today as a referral from Dr. Allena Katz at [facility]. Dr. Allena Katz did do surgery on this gentleman after he had revascularization and May on the 15th of 2024. Subsequently after the revascularization and stent placement by Dr. Wyn Quaker Dr. 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 Unfortunately it did not remain closed and dehisced presumably due to the fact that the patient states he was walking on it. He really does not have any evidence at this point of systemic infection which is good news but unfortunately the wound is doing quite poorly. He also has a big area of callus on the left foot this can require debridement today there may be a wound under this as well but appear. 08-29-23 upon evaluation today patient unfortunately appears to be doing not so well in regard to his foot ulcer. Since I last saw him he has not had the dressing changed even 1 time. He had matted hair which apparently is Here in the wound bed which required quite a bit of cleaning to clear out by the nurse prior to my coming in. With that being said I think that if he is not careful he is at high risk for losing his leg if this becomes subsequently infected. I discussed that with him today. Unfortunately he had home health but since we sent him orders for wound care they discharged him stating "they did not have any staff". Again this is something that to be peripherally honest we run into many times with patients with Medicaid or insurance carriers I do not pay well for home health services that we will be told they do not have "staph" when really my assumption is they do not want to take on the patient as they will accept other patients with Medicare otherwise. There is only a small number of Medicaid patients that it will help company will take on. Objective Constitutional Well-nourished and well-hydrated in no acute distress. Vitals Time Taken: 3:26 PM, Temperature: 97.6 F, Pulse: 106 bpm, Respiratory Rate: 18 breaths/min, Blood Pressure: 141/83 mmHg. Respiratory normal breathing without difficulty. Psychiatric this patient is able to make decisions and demonstrates good insight into disease process. Alert and Oriented x 3. pleasant and cooperative. General Notes:  Upon inspection unfortunately the patient appears to be  doing poorly in regard to keeping the wound clean he did not even change it since he was last here and he had cat hair matted in with drainage into the wound which was not good. This puts him at high risk for infection and worsening overall and we discussed this today. With that being said I do believe that he is going to need to be much more careful and I think at this point working to try to bring him in for nurse visits here in the clinic as best we can. Integumentary (Hair, Skin) Wound #2 status is Open. Original cause of wound was Surgical Injury. The date acquired was: 06/30/2022. The wound has been in treatment 1 weeks. The wound is located on the Right,Midline T Second. The wound measures 4cm length x 7cm width x 4cm depth; 21.991cm^2 area and 87.965cm^3 volume. There oe is bone and Fat Layer (Subcutaneous Tissue) exposed. There is no tunneling or undermining noted. There is a medium amount of serosanguineous drainage noted. There is medium (34-66%) red, pink granulation within the wound bed. There is no necrotic tissue within the wound bed. Wound #3 status is Open. Original cause of wound was Pressure Injury. The date acquired was: 04/13/2022. The wound has been in treatment 1 weeks. The wound is located on the Left,Lateral Foot. The wound measures 0cm length x 0cm width x 0cm depth; 0cm^2 area and 0cm^3 volume. There is no tunneling or undermining noted. There is a none present amount of drainage noted. There is no granulation within the wound bed. There is no necrotic tissue within the wound bed. 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 - Late afternoon appt Mondays Bathing/ Shower/ Hygiene: Wash wounds with antibacterial soap and water. May shower; gently cleanse wound with antibacterial soap, rinse and pat dry prior to dressing wounds No tub bath. Anesthetic (Use 'Patient Medications' Section for Anesthetic Order Entry): Lidocaine applied to wound bed Edema Control - Lymphedema / Segmental Compressive Device / Other: Elevate, Exercise Daily and Avoid Standing for Long Periods of Time. Elevate legs to the level of the heart and pump ankles as often as possible Elevate leg(s) parallel to the floor when sitting. Off-Loading: Open toe surgical shoe - Bilateral. WOUND #2: - T Second Wound Laterality: Right, Midline oe Cleanser: Soap and Water (Home Health) 3 x Per Week/30 Days Discharge Instructions: Gently cleanse wound with antibacterial soap, rinse and pat dry prior to dressing wounds Cleanser: Vashe 5.8 (oz) (Home Health) 3 x Per Week/30 Days Discharge Instructions: Use vashe 5.8 (oz) as directed Prim Dressing: Hydrofera Blue Ready Transfer Foam, 2.5x2.5 (in/in) (Home Health) 3 x Per Week/30 Days ary Discharge Instructions: Apply Hydrofera Blue Ready to wound bed as directed Prim Dressing: Hydrofera Blue Classic Foam Rope Dressing, 9x6 (mm/in) 3 x Per Week/30 Days ary Discharge Instructions: inserted into wound cavity Secondary Dressing: ABD Pad 5x9 (in/in) 3 x Per Week/30 Days Discharge Instructions: Cover with ABD pad Secondary Dressing: Gauze 3 x Per Week/30 Days Discharge Instructions: As directed: dry, moistened with saline or moistened with Dakins Solution Secured With: Medipore  T - 3M Medipore H Soft Cloth Surgical T ape ape, 2x2 (in/yd) 3 x Per Week/30 Days 1. I  would recommend based on what we are seeing that we have the patient continue with nurse visits for now I did contact Dr. Eliane Decree office Dr. Allena Katz did call me back and I had a conversation with him as well. I explained that I feel that the patient is incapable of actually taking care of this on his own at home. For that reason I feel like he needs to likely be in a facility [place] agreed. With that being said he also stated the only way to get that done would be 3 hospital admission and then subsequent discharge to rehab. Working to see how things go with the nurse visits here in our clinic but this is not a guarantee that is good to get better and I explained this to Dr. Allena Katz. I am also unsure if the patient is to be able to make the visits on a regular basis. 2. If things do get worse or is not able to appropriately care for himself and to send him into the ER for evaluation and have him consult Dr. Allena Katz to see about admission to the hospital and then subsequent from there admission into a skilled nursing facility. We will see patient back for reevaluation in 1 week here in the clinic. If anything worsens or changes patient will contact our office for additional recommendations. Electronic Signature(s) Signed: 08/29/2022 5:56:37 PM By: Allen Derry PA-C Entered By: Allen Derry on 08/29/2022 17:56:37 -------------------------------------------------------------------------------- SuperBill Details Patient Name: Date of Service: Martin Mullen ER, [name] 08/29/2022 Medical Record Number: [id number] Patient Account Number: [id number] Date of Birth/Sex: Treating RN: 12-Feb-1981 (42 y.o. [name] Primary Care Provider: [name] Other Clinician: Referring Provider: Treating Provider/Extender: Carron Curie in Treatment: 1 Diagnosis Coding ICD-10 Codes Code Description [phone] Type 2 diabetes mellitus with foot ulcer L97.522 Non-pressure chronic ulcer of other part of left  foot with fat layer exposed Martin Mullen, Martin Mullen ([id number]) [id number] Page 8 of 8 T81.31XA Disruption of external operation (surgical) wound, not elsewhere classified, initial encounter L97.512 Non-pressure chronic ulcer of other part of right foot with fat layer exposed I10 Essential (primary) hypertension I25.10 Atherosclerotic heart disease of native coronary artery without angina pectoris Facility Procedures : CPT4 Code: [id number] Description: 06269 - WOUND CARE VISIT-LEV 2 EST PT Modifier: Quantity: 1 Physician Procedures : CPT4 Code Description Modifier [id number] - WC PHYS LEVEL 4 - EST PT ICD-10 Diagnosis Description E11.621 Type 2 diabetes mellitus with foot ulcer L97.522 Non-pressure chronic ulcer of other part of left foot with fat layer exposed T81.31XA  Disruption of external operation (surgical) wound, not elsewhere classified, initial encounter L97.512 Non-pressure chronic ulcer of other part of right foot with fat layer exposed Quantity: 1 Electronic Signature(s) Signed: 08/29/2022 5:59:39 PM By: Allen Derry PA-C Entered By: Allen Derry on 08/29/2022 17:59:38

## 2022-08-29 NOTE — Telephone Encounter (Signed)
   Outreach Note  08/29/2022 Name: COSIMO SCHERTZER MRN: [id number] DOB: 1980/11/10  Referred by: [name], NP Reason for referral : No chief complaint on file.   Was unable to reach patient via telephone today and have left HIPAA compliant voicemail asking patient to return my call.    Follow Up Plan: Will collaborate with Care Guide to outreach to schedule follow up with me  Estelle Grumbles, PharmD, [facility] Clinical Pharmacist [facility] [phone]

## 2022-08-29 NOTE — Progress Notes (Addendum)
BABAJIDE, RUFENACHT ([id number]) [phone].pdf Page 1 of 9 Visit Report for 08/29/2022 Arrival Information Details Patient Name: Date of Service: [name], [name] 08/29/2022 3:15 PM Medical Record Number: [id number] Patient Account Number: [id number] Date of Birth/Sex: Treating RN: 1980-09-18 (42 y.o. Judie Petit) Yevonne Pax Primary Care Guilianna Mckoy: [name] Other Clinician: Referring Cornie Herrington: Treating Abdirahman Chittum/Extender: Carron Curie in Treatment: 1 Visit Information History Since Last Visit Added or deleted any medications: No Patient Arrived: Ambulatory Any new allergies or adverse reactions: No Arrival Time: 15:25 Had a fall or experienced change in No Accompanied By: friend activities of daily living that may affect Transfer Assistance: None risk of falls: Patient Identification Verified: Yes Signs or symptoms of abuse/neglect since last visito No Secondary Verification Process Completed: Yes Hospitalized since last visit: No Patient Requires Transmission-Based Precautions: No Implantable device outside of the clinic excluding No Patient Has Alerts: Yes cellular tissue based products placed in the center Patient Alerts: Patient on Blood Thinner since last visit: Eliquis Has Dressing in Place as Prescribed: Yes Diabetic Type 2 Pain Present Now: No Electronic Signature(s) Signed: 09/01/2022 3:42:23 PM By: Yevonne Pax RN Entered By: Yevonne Pax on 08/29/2022 15:26:19 -------------------------------------------------------------------------------- Clinic Level of Care Assessment Details Patient Name: Date of Service: [name], [name] 08/29/2022 3:15 PM Medical Record Number: [id number] Patient Account Number: [id number] Date of Birth/Sex: Treating RN: Feb 12, 1981 (42 y.o. [name] Primary Care Haylei Cobin: [name] Other Clinician: Referring Wylie Russon: Treating Kizer Nobbe/Extender: Carron Curie in Treatment:  1 Clinic Level of Care Assessment Items TOOL 4 Quantity Score X- 1 0 Use when only an EandM is performed on FOLLOW-UP visit ASSESSMENTS - Nursing Assessment / Reassessment X- 1 10 Reassessment of Co-morbidities (includes updates in patient status) X- 1 5 Reassessment of Adherence to Treatment Plan KORE, SANLUIS ([id number]) [phone].pdf Page 2 of 9 ASSESSMENTS - Wound and Skin A ssessment / Reassessment X - Simple Wound Assessment / Reassessment - one wound 1 5 []  - 0 Complex Wound Assessment / Reassessment - multiple wounds []  - 0 Dermatologic / Skin Assessment (not related to wound area) ASSESSMENTS - Focused Assessment []  - 0 Circumferential Edema Measurements - multi extremities []  - 0 Nutritional Assessment / Counseling / Intervention []  - 0 Lower Extremity Assessment (monofilament, tuning fork, pulses) []  - 0 Peripheral Arterial Disease Assessment (using hand held doppler) ASSESSMENTS - Ostomy and/or Continence Assessment and Care []  - 0 Incontinence Assessment and Management []  - 0 Ostomy Care Assessment and Management (repouching, etc.) PROCESS - Coordination of Care X - Simple Patient / Family Education for ongoing care 1 15 []  - 0 Complex (extensive) Patient / Family Education for ongoing care []  - 0 Staff obtains Chiropractor, Records, T Results / Process Orders est []  - 0 Staff telephones HHA, Nursing Homes / Clarify orders / etc []  - 0 Routine Transfer to another Facility (non-emergent condition) []  - 0 Routine Hospital Admission (non-emergent condition) []  - 0 New Admissions / Manufacturing engineer / Ordering NPWT Apligraf, etc. , []  - 0 Emergency Hospital Admission (emergent condition) X- 1 10 Simple Discharge Coordination []  - 0 Complex (extensive) Discharge Coordination PROCESS - Special Needs []  - 0 Pediatric / Minor Patient Management []  - 0 Isolation Patient Management []  - 0 Hearing / Language / Visual special  needs []  - 0 Assessment of Community assistance (transportation, D/C planning, etc.) []  - 0 Additional assistance / Altered mentation []  - 0 Support Surface(s) Assessment (bed, cushion,  seat, etc.) INTERVENTIONS - Wound Cleansing / Measurement X - Simple Wound Cleansing - one wound 1 5 []  - 0 Complex Wound Cleansing - multiple wounds X- 1 5 Wound Imaging (photographs - any number of wounds) []  - 0 Wound Tracing (instead of photographs) X- 1 5 Simple Wound Measurement - one wound []  - 0 Complex Wound Measurement - multiple wounds INTERVENTIONS - Wound Dressings X - Small Wound Dressing one or multiple wounds 1 10 []  - 0 Medium Wound Dressing one or multiple wounds []  - 0 Large Wound Dressing one or multiple wounds []  - 0 Application of Medications - topical []  - 0 Application of Medications - injection INTERVENTIONS - Miscellaneous []  - 0 External ear exam WELLINGTON, AMLIN ([id number]) [phone].pdf Page 3 of 9 []  - 0 Specimen Collection (cultures, biopsies, blood, body fluids, etc.) []  - 0 Specimen(s) / Culture(s) sent or taken to Lab for analysis []  - 0 Patient Transfer (multiple staff / Michiel Sites Lift / Similar devices) []  - 0 Simple Staple / Suture removal (25 or less) []  - 0 Complex Staple / Suture removal (26 or more) []  - 0 Hypo / Hyperglycemic Management (close monitor of Blood Glucose) []  - 0 Ankle / Brachial Index (ABI) - do not check if billed separately X- 1 5 Vital Signs Has the patient been seen at the hospital within the last three years: Yes Total Score: 75 Level Of Care: New/Established - Level 2 Electronic Signature(s) Signed: 09/01/2022 3:42:23 PM By: Yevonne Pax RN Entered By: Yevonne Pax on 08/29/2022 16:17:18 -------------------------------------------------------------------------------- Encounter Discharge Information Details Patient Name: Date of Service: [name] ER, [name] 08/29/2022 3:15 PM Medical Record  Number: [id number] Patient Account Number: [id number] Date of Birth/Sex: Treating RN: 1980-10-01 (42 y.o. [name] Primary Care Bibiana Gillean: [name] Other Clinician: Referring Melesa Lecy: Treating Thara Searing/Extender: Carron Curie in Treatment: 1 Encounter Discharge Information Items Discharge Condition: Stable Ambulatory Status: Ambulatory Discharge Destination: Home Transportation: Private Auto Accompanied By: self Schedule Follow-up Appointment: Yes Clinical Summary of Care: Electronic Signature(s) Signed: 09/01/2022 3:42:23 PM By: Yevonne Pax RN Entered By: Yevonne Pax on 08/29/2022 16:18:27 -------------------------------------------------------------------------------- Lower Extremity Assessment Details Patient Name: Date of Service: [name] ER, [name] 08/29/2022 3:15 PM Mayo Ao ([id number]) [id number] Page 4 of 9 Medical Record Number: [id number] Patient Account Number: [id number] Date of Birth/Sex: Treating RN: 07-06-1980 (42 y.o. Judie Petit) Yevonne Pax Primary Care Jazmyn Offner: [name] Other Clinician: Referring Paxon Propes: Treating Joanny Dupree/Extender: Carron Curie in Treatment: 1 Electronic Signature(s) Signed: 09/01/2022 3:42:23 PM By: Yevonne Pax RN Entered By: Yevonne Pax on 08/29/2022 16:06:16 -------------------------------------------------------------------------------- Multi Wound Chart Details Patient Name: Date of Service: [name] ER, [name] 08/29/2022 3:15 PM Medical Record Number: [id number] Patient Account Number: [id number] Date of Birth/Sex: Treating RN: 04-30-1980 (42 y.o. 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 Coronary Artery Disease, Coronary Artery Disease, N/A Comorbid History: Hypertension, Myocardial Infarction, Hypertension, Myocardial Infarction, Type II Diabetes Type II Diabetes 06/30/2022 04/13/2022 N/A Date Acquired: 1 1 N/A Weeks of Treatment: Open Open N/A Wound Status: No No N/A Wound Recurrence: Yes Yes  N/A Pending A mputation on Presentation: 4x7x4 0.1x0.1x0.1 N/A Measurements L x W x D (cm) 21.991 0.008 N/A A (cm) : rea 87.965 0.001 N/A Volume (cm) : -981.20% 74.20% N/A % Reduction in A rea: -1101.20% 94.70% N/A % Reduction in Volume: Full Thickness With Exposed Support Grade 1 N/A Classification: Structures Medium Medium N/A Exudate Amount: Serosanguineous Serosanguineous N/A Exudate Type: red, brown red, brown N/A Exudate Color: Medium (34-66%) None Present (0%) N/A Granulation Amount: Red, Pink N/A N/A Granulation Quality: None Present (0%) Medium (34-66%) N/A Necrotic Amount: ISIDORO, THALHEIMER ([id number]) [phone].pdf Page 5 of 9 Fat Layer (Subcutaneous Tissue): Yes Fat Layer (Subcutaneous Tissue): Yes N/A Exposed Structures: Bone: Yes Fascia: No Fascia: No Tendon: No Tendon: No Muscle: No Muscle: No Joint: No Joint: No Bone: No N/A None N/A Epithelialization: Treatment Notes Electronic Signature(s) Signed: 09/01/2022 3:42:23 PM By: Yevonne Pax RN Entered By: Yevonne Pax on 08/29/2022 15:40:39 -------------------------------------------------------------------------------- Multi-Disciplinary Care Plan Details Patient Name: Date of Service: [name] ER, [name] 08/29/2022 3:15 PM Medical Record Number: [id number] Patient Account Number: [id number] Date  of Birth/Sex: Treating RN: [birth year] (42 y.o. [name] Primary Care Jamorion Gomillion: [name] Other Clinician: Referring Koby Hartfield: Treating Matia Zelada/Extender: Carron Curie in Treatment: 1 Active Inactive Wound/Skin Impairment Nursing Diagnoses: Impaired tissue integrity Knowledge deficit related to smoking impact on wound healing Knowledge deficit related to ulceration/compromised skin integrity Goals: Patient will demonstrate a reduced rate of smoking or cessation of smoking Date Initiated: 08/22/2022 Target Resolution Date: 09/22/2022 Goal Status: Active Patient/caregiver will verbalize understanding of skin care regimen Date Initiated: 08/22/2022 Target Resolution Date: 09/22/2022 Goal Status: Active Ulcer/skin breakdown will have a volume reduction of 30% by week 4 Date Initiated: 08/22/2022 Target Resolution Date: 09/22/2022 Goal Status: Active Ulcer/skin breakdown will have a volume reduction of 50% by week 8 Date Initiated: 08/22/2022 Target Resolution Date: 10/23/2022 Goal Status: Active Ulcer/skin breakdown will have a volume reduction of 80% by week 12 Date Initiated: 08/22/2022 Target Resolution Date: 11/22/2022 Goal Status: Active Ulcer/skin breakdown will heal within 14 weeks Date Initiated: 08/22/2022 Target Resolution Date: 12/06/2022 Goal Status: Active Interventions: Assess patient/caregiver ability to obtain necessary supplies Assess patient/caregiver ability to perform ulcer/skin care regimen upon admission and as needed Assess ulceration(s) every visit Provide education on smoking Provide education on ulcer and skin care FERDINANDO, TRAINO ([id number]) [phone].pdf Page 6 of 9 Treatment Activities: Patient referred to home care : 08/22/2022 Skin care regimen initiated : 08/22/2022 Notes: Electronic Signature(s) Signed: 09/01/2022 3:42:23 PM By: Yevonne Pax RN Entered By: Yevonne Pax on 08/29/2022  16:17:54 -------------------------------------------------------------------------------- Pain Assessment Details Patient Name: Date of Service: [name] ER, [name] 08/29/2022 3:15 PM Medical Record Number: [id number] Patient Account Number: [id number] Date of Birth/Sex: Treating RN: 25-Dec-1980 (42 y.o. [name] Primary Care Jovannie Ulibarri: [name] Other Clinician: Referring Satrina Magallanes: Treating Christalynn Boise/Extender: Carron Curie in Treatment: 1 Active Problems Location of Pain Severity and Description of Pain Patient Has Paino No Site Locations Pain Management and Medication Current Pain Management: Electronic Signature(s) Signed: 09/01/2022 3:42:23 PM By: Yevonne Pax RN Entered By: Yevonne Pax on 08/29/2022 15:27:13 Mayo Ao ([id number]) [id number] Page 7 of 9 -------------------------------------------------------------------------------- Patient/Caregiver Education Details Patient Name: Date of Service: [name] ER, SCO Quinn Axe 7/15/2024andnbsp3:15 PM Medical Record Number: [id number] Patient Account Number: [id number] Date of Birth/Gender: Treating RN: 03/11/1980 (42 y.o. [name] Primary Care Physician: [name] Other Clinician: Referring Physician: Treating Physician/Extender: Monica Martinez  Weeks in Treatment: 1 Education Assessment Education Provided To: Patient Education Topics Provided Wound/Skin Impairment: Handouts: Caring for Your Ulcer Methods: Explain/Verbal Responses: State content correctly Electronic Signature(s) Signed: 09/01/2022 3:42:23 PM By: Yevonne Pax RN Entered By: Yevonne Pax on 08/29/2022 15:41:01 -------------------------------------------------------------------------------- Wound Assessment Details Patient Name: Date of Service: [name] ER, [name] 08/29/2022 3:15 PM Medical Record Number: [id number] Patient Account Number: [id number] Date of Birth/Sex: Treating  RN: 1980-11-30 (42 y.o. [name] Primary Care Mardi Cannady: [name] Other Clinician: Referring Dmiya Malphrus: Treating Kia Varnadore/Extender: Monica Martinez Weeks in Treatment: 1 Wound Status Wound Number: 2 Primary Dehisced Wound Etiology: Wound Location: Right, Midline T Second oe Wound Open Wounding Event: Surgical Injury Status: Date Acquired: 06/30/2022 Comorbid Coronary Artery Disease, Hypertension, Myocardial Infarction, Weeks Of Treatment: 1 History: Type II Diabetes Clustered Wound: No Pending Amputation On Presentation Photos MOAYAD, MANTLE ([id number]) [phone].pdf Page 8 of 9 Wound Measurements Length: (cm) 4 Width: (cm) 7 Depth: (cm) 4 Area: (cm) 21.991 Volume: (cm) 87.965 % Reduction in Area: -981.2% % Reduction in Volume: -1101.2% Tunneling: No Undermining: No Wound Description Classification: Full Thickness With Exposed Suppo Exudate Amount: Medium Exudate Type: Serosanguineous Exudate Color: red, brown rt Structures Foul Odor After Cleansing: No Slough/Fibrino No Wound Bed Granulation Amount: Medium (34-66%) Exposed Structure Granulation Quality: Red, Pink Fascia Exposed: No Necrotic Amount: None Present (0%) Fat Layer (Subcutaneous Tissue) Exposed: Yes Tendon Exposed: No Muscle Exposed: No Joint Exposed: No Bone Exposed: Yes Electronic Signature(s) Signed: 09/01/2022 3:42:23 PM By: Yevonne Pax RN Entered By: Yevonne Pax on 08/29/2022 15:40:00 -------------------------------------------------------------------------------- Wound Assessment Details Patient Name: Date of Service: [name] ER, [name] 08/29/2022 3:15 PM Medical Record Number: [id number] Patient Account Number: [id number] Date of Birth/Sex: Treating RN: [birth year] (42 y.o. [name] Primary Care Leyah Bocchino: [name] Other Clinician: Referring Shamarion Coots: Treating Zema Lizardo/Extender: Monica Martinez Weeks in Treatment:  1 Wound Status Wound Number: 3 Primary Diabetic Wound/Ulcer of the Lower Extremity Etiology: Wound Location: Left, Lateral Foot Wound Open Wounding Event: Pressure Injury Status: Date Acquired: 04/13/2022 Comorbid Coronary Artery Disease, Hypertension, Myocardial Infarction, Weeks Of Treatment: 1 History: Type II Diabetes Clustered Wound: No Pending Amputation On Presentation Photos ARON, WOLFFE ([id number]) [phone].pdf Page 9 of 9 Wound Measurements Length: (cm) Width: (cm) Depth: (cm) Area: (cm) Volume: (cm) 0 % Reduction in Area: 100% 0 % Reduction in Volume: 100% 0 Epithelialization: Large (67-100%) 0 Tunneling: No 0 Undermining: No Wound Description Classification: Grade 1 Exudate Amount: None Present Foul Odor After Cleansing: No Slough/Fibrino No Wound Bed Granulation Amount: None Present (0%) Exposed Structure Necrotic Amount: None Present (0%) Fascia Exposed: No Fat Layer (Subcutaneous Tissue) Exposed: No Tendon Exposed: No Muscle Exposed: No Joint Exposed: No Bone Exposed: No Electronic Signature(s) Signed: 09/01/2022 3:42:23 PM By: Yevonne Pax RN Entered By: Yevonne Pax on 08/29/2022 16:04:18 -------------------------------------------------------------------------------- Vitals Details Patient Name: Date of Service: [name] ER, [name] 08/29/2022 3:15 PM Medical Record Number: [id number] Patient Account Number: [id number] Date of Birth/Sex: Treating RN: 09-09-1980 (42 y.o. [name] Primary Care Franke Menter: [name] Other Clinician: Referring Shuntae Herzig: Treating Elnita Surprenant/Extender: Carron Curie in Treatment: 1 Vital Signs Time Taken: 15:26 Temperature (F): 97.6 Pulse (bpm): 106 Respiratory Rate (breaths/min): 18 Blood Pressure (mmHg): 141/83 Reference Range: 80 - 120 mg / dl Electronic Signature(s) Signed: 09/01/2022 3:42:23 PM By: Yevonne Pax RN Entered By: Yevonne Pax on 08/29/2022  15:27:06

## 2022-08-31 DIAGNOSIS — E11621 Type 2 diabetes mellitus with foot ulcer: Secondary | ICD-10-CM | POA: Diagnosis not present

## 2022-08-31 NOTE — Progress Notes (Signed)
Martin Mullen ([id number]) [id number] Page 1 of 5 Visit Report for 08/31/2022 Arrival Information Details Patient Name: Date of Service: Martin Mullen ER, [name] 08/31/2022 4:15 PM Medical Record Number: [id number] Patient Account Number: [id number] Date of Birth/Sex: Treating RN: [birth year] (42 y.o. Martin Mullen Primary Care Dyquan Minks: [name] Other Clinician: Referring [name]: Treating [name]/Extender: Denman George in Treatment: 1 Visit Information History Since Last Visit Added or deleted any medications: No Patient Arrived: Ambulatory Any new allergies or adverse reactions: No Arrival Time: 16:02 Had a fall or experienced change in No Accompanied By: friend waiting room activities of daily living that may affect Transfer Assistance: None risk of falls: Patient Identification Verified: Yes Hospitalized since last visit: No Secondary Verification Process Completed: Yes Has Dressing in Place as Prescribed: Yes Patient Requires Transmission-Based Precautions: No Has Footwear/Offloading in Place as Yes Patient Has Alerts: Yes Prescribed: Patient Alerts: Patient on Blood Thinner Left: Surgical Shoe with Pressure Relief Insole Eliquis Right: Surgical Shoe with Pressure Relief Insole Diabetic Type 2 Pain Present Now: No Electronic Signature(s) Signed: 08/31/2022 4:35:37 PM By: Angelina Pih Entered By: Angelina Pih on 08/31/2022 16:15:31 -------------------------------------------------------------------------------- Clinic Level of Care Assessment Details Patient Name: Date of Service: [name], [name] 08/31/2022 4:15 PM Medical Record Number: [id number] Patient Account Number: [id number] Date of Birth/Sex: Treating RN: 07/10/1980 (42 y.o. Martin Mullen Primary Care [name]: [name] Other Clinician: Referring Kathia Covington: Treating [name]/Extender: Denman George in  Treatment: 1 Clinic Level of Care Assessment Items TOOL 4 Quantity Score []  - 0 Use when only an EandM is performed on FOLLOW-UP visit ASSESSMENTS - Nursing Assessment / Reassessment X- 1 10 Reassessment of Co-morbidities (includes updates in patient status) X- 1 5 Reassessment of Adherence to Treatment Plan BARKLEY, KRATOCHVIL ([id number]) [id number] Page 2 of 5 ASSESSMENTS - Wound and Skin A ssessment / Reassessment X - Simple Wound Assessment / Reassessment - one wound 1 5 []  - 0 Complex Wound Assessment / Reassessment - multiple wounds []  - 0 Dermatologic / Skin Assessment (not related to wound area) ASSESSMENTS - Focused Assessment []  - 0 Circumferential Edema Measurements - multi extremities []  - 0 Nutritional Assessment / Counseling / Intervention []  - 0 Lower Extremity Assessment (monofilament, tuning fork, pulses) []  - 0 Peripheral Arterial Disease Assessment (using hand held doppler) ASSESSMENTS - Ostomy and/or Continence Assessment and Care []  - 0 Incontinence Assessment and Management []  - 0 Ostomy Care Assessment and Management (repouching, etc.) PROCESS - Coordination of Care X - Simple Patient / Family Education for ongoing care 1 15 []  - 0 Complex (extensive) Patient / Family Education for ongoing care X- 1 10 Staff obtains Chiropractor, Records, T Results / Process Orders est []  - 0 Staff telephones HHA, Nursing Homes / Clarify orders / etc []  - 0 Routine Transfer to another Facility (non-emergent condition) []  - 0 Routine Hospital Admission (non-emergent condition) []  - 0 New Admissions / Manufacturing engineer / Ordering NPWT Apligraf, etc. , []  - 0 Emergency Hospital Admission (emergent condition) X- 1 10 Simple Discharge Coordination []  - 0 Complex (extensive) Discharge Coordination PROCESS - Special Needs []  - 0 Pediatric / Minor Patient Management []  - 0 Isolation Patient Management []  - 0 Hearing / Language /  Visual special needs []  - 0 Assessment of Community assistance (transportation, D/C planning, etc.) []  - 0 Additional assistance / Altered mentation []  - 0 Support Surface(s) Assessment (bed, cushion, seat, etc.) INTERVENTIONS - Wound Cleansing /  Measurement X - Simple Wound Cleansing - one wound 1 5 []  - 0 Complex Wound Cleansing - multiple wounds X- 1 5 Wound Imaging (photographs - any number of wounds) []  - 0 Wound Tracing (instead of photographs) X- 1 5 Simple Wound Measurement - one wound []  - 0 Complex Wound Measurement - multiple wounds INTERVENTIONS - Wound Dressings X - Small Wound Dressing one or multiple wounds 1 10 []  - 0 Medium Wound Dressing one or multiple wounds []  - 0 Large Wound Dressing one or multiple wounds []  - 0 Application of Medications - topical []  - 0 Application of Medications - injection INTERVENTIONS - Miscellaneous []  - 0 External ear exam SAHEED, CARRINGTON ([id number]) [id number] Page 3 of 5 []  - 0 Specimen Collection (cultures, biopsies, blood, body fluids, etc.) []  - 0 Specimen(s) / Culture(s) sent or taken to Lab for analysis []  - 0 Patient Transfer (multiple staff / Michiel Sites Lift / Similar devices) []  - 0 Simple Staple / Suture removal (25 or less) []  - 0 Complex Staple / Suture removal (26 or more) []  - 0 Hypo / Hyperglycemic Management (close monitor of Blood Glucose) []  - 0 Ankle / Brachial Index (ABI) - do not check if billed separately []  - 0 Vital Signs Has the patient been seen at the hospital within the last three years: Yes Total Score: 80 Level Of Care: New/Established - Level 3 Electronic Signature(s) Signed: 08/31/2022 4:35:37 PM By: Angelina Pih Entered By: Angelina Pih on 08/31/2022 16:35:13 -------------------------------------------------------------------------------- Encounter Discharge Information Details Patient Name: Date of Service: Martin Mullen ER, [name] 08/31/2022 4:15  PM Medical Record Number: [id number] Patient Account Number: [id number] Date of Birth/Sex: Treating RN: 04-26-1980 (42 y.o. Martin Mullen Primary Care Mercie Balsley: [name] Other Clinician: Referring Martin Mullen: Treating Martin Mullen/Extender: Denman George in Treatment: 1 Encounter Discharge Information Items Discharge Condition: Stable Ambulatory Status: Ambulatory Discharge Destination: Home Transportation: Private Auto Accompanied By: friend Schedule Follow-up Appointment: Yes Clinical Summary of Care: Electronic Signature(s) Signed: 08/31/2022 4:35:37 PM By: Angelina Pih Entered By: Angelina Pih on 08/31/2022 16:34:56 -------------------------------------------------------------------------------- Wound Assessment Details Patient Name: Date of Service: Martin Mullen ER, [name] 08/31/2022 4:15 PM Mayo Ao ([id number]) [id number] Page 4 of 5 Medical Record Number: [id number] Patient Account Number: [id number] Date of Birth/Sex: Treating RN: December 04, 1980 (42 y.o. Martin Mullen Primary Care Latash Nouri: [name] Other Clinician: Referring Traveon Louro: Treating Roselynn Whitacre/Extender: Denman George in Treatment: 1 Wound Status Wound Number: 2 Primary Dehisced Wound Etiology: Wound Location: Right, Midline T Second oe Wound Open Wounding Event: Surgical Injury Status: Date Acquired: 06/30/2022 Comorbid Coronary Artery Disease, Hypertension, Myocardial Weeks Of Treatment: 1 History: Infarction, Type II Diabetes Clustered Wound: No Pending Amputation On Presentation Wound Measurements Length: (cm) 4 Width: (cm) 7 Depth: (cm) 4 Area: (cm) 21.991 Volume: (cm) 87.965 % Reduction in Area: -981.2% % Reduction in Volume: -1101.2% Wound Description Classification: Full Thickness With Exposed Suppo Exudate Amount: Medium Exudate Type: Serosanguineous Exudate Color: red, brown rt Structures Foul  Odor After Cleansing: No Slough/Fibrino No Wound Bed Granulation Amount: Medium (34-66%) Exposed Structure Granulation Quality: Red, Pink Fascia Exposed: No Necrotic Amount: None Present (0%) Fat Layer (Subcutaneous Tissue) Exposed: Yes Tendon Exposed: No Muscle Exposed: No Joint Exposed: No Bone Exposed: Yes Treatment Notes Wound #2 (Toe Second) Wound Laterality: Right, Midline Cleanser Soap and Water Discharge Instruction: Gently cleanse wound with antibacterial soap, rinse and pat dry prior to dressing wounds Vashe 5.8 (oz) Discharge Instruction: Use  vashe 5.8 (oz) as directed Peri-Wound Care Topical Primary Dressing Hydrofera Blue Ready Transfer Foam, 2.5x2.5 (in/in) Discharge Instruction: Apply Hydrofera Blue Ready to wound bed as directed Hydrofera Blue Classic Foam Rope Dressing, 9x6 (mm/in) Discharge Instruction: inserted into wound cavity Secondary Dressing ABD Pad 5x9 (in/in) Discharge Instruction: Cover with ABD pad Gauze Discharge Instruction: As directed: dry Secured With Medipore T - 69M Medipore H Soft Cloth Surgical T ape ape, 2x2 (in/yd) Compression Wrap Compression Stockings Add-Ons Electronic Signature(s) Signed: 08/31/2022 4:35:37 PM By: Dereck Leep, Ihor Austin ([id number]) PM By: Angelina Pih [id number] Page 5 of 5 Signed: 08/31/2022 4:35:37 Entered By: Angelina Pih on 08/31/2022 16:15:45

## 2022-09-01 NOTE — Progress Notes (Signed)
Martin, Mullen ([id number]) [id number] Page 1 of 2 Visit Report for 08/31/2022 Physician Orders Details Patient Name: Date of Service: Martin Mullen ER, [name] 08/31/2022 4:15 PM Medical Record Number: [id number] Patient Account Number: [id number] Date of Birth/Sex: Treating RN: March 18, 1980 (42 y.o. Martin Mullen Primary Care Provider: [name] Other Clinician: Referring Provider: Treating Provider/Extender: Denman George in Treatment: 1 Verbal / Phone Orders: No Diagnosis Coding Follow-up Appointments ppointment in 1 week. - Late afternoon appt Mondays Return A Nurse Visit as needed - Wednesday and Friday Bathing/ [employer] wounds with antibacterial soap and water. May shower; gently cleanse wound with antibacterial soap, rinse and pat dry prior to dressing wounds No tub bath. Anesthetic (Use 'Patient Medications' Section for Anesthetic Order Entry) Lidocaine applied to wound bed Edema Control - Lymphedema / Segmental Compressive Device / Other Elevate, Exercise Daily and A void Standing for Long Periods of Time. Elevate legs to the level of the heart and pump ankles as often as possible Elevate leg(s) parallel to the floor when sitting. Off-Loading Open toe surgical shoe - Bilateral. Wound Treatment Wound #2 - T Second oe Wound Laterality: Right, Midline Cleanser: Soap and Water (Home Health) 3 x Per Week/30 Days Discharge Instructions: Gently cleanse wound with antibacterial soap, rinse and pat dry prior to dressing wounds Cleanser: Vashe 5.8 (oz) (Home Health) 3 x Per Week/30 Days Discharge Instructions: Use vashe 5.8 (oz) as directed Prim Dressing: Hydrofera Blue Ready Transfer Foam, 2.5x2.5 (in/in) (Home Health) 3 x Per Week/30 Days ary Discharge Instructions: Apply Hydrofera Blue Ready to wound bed as directed Prim Dressing: Hydrofera Blue Classic Foam Rope Dressing, 9x6 (mm/in) ary 3 x Per Week/30  Days Discharge Instructions: inserted into wound cavity Secondary Dressing: ABD Pad 5x9 (in/in) 3 x Per Week/30 Days Discharge Instructions: Cover with ABD pad Secondary Dressing: Gauze 3 x Per Week/30 Days Discharge Instructions: As directed: dry Secured With: Medipore T - 32M Medipore H Soft Cloth Surgical T ape ape, 2x2 (in/yd) 3 x Per Week/30 Days Electronic Signature(s) Signed: 08/31/2022 4:35:37 PM By: Angelina Pih Signed: 09/01/2022 1:03:58 PM By: Geralyn Corwin DO Entered By: Angelina Pih on 08/31/2022 16:34:37 Martin Mullen, Martin Mullen ([id number]) [id number] Page 2 of 2 -------------------------------------------------------------------------------- SuperBill Details Patient Name: Date of Service: [name], [name] 08/31/2022 Medical Record Number: [id number] Patient Account Number: [id number] Date of Birth/Sex: Treating RN: [birth year] (42 y.o. 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 T81.31XA Disruption of external operation (surgical) wound, not elsewhere classified, initial encounter L97.512 Non-pressure chronic ulcer of other part of right foot with fat layer exposed I10 Essential (primary) hypertension I25.10 Atherosclerotic heart disease of native coronary artery without angina pectoris Facility Procedures : CPT4 Code: 41324401 Description: 99213 - WOUND CARE VISIT-LEV 3 EST PT Modifier: Quantity: 1 Electronic Signature(s) Signed: 08/31/2022 4:35:37 PM By: Angelina Pih Signed: 09/01/2022 1:03:58 PM By: Geralyn Corwin DO Entered By: Angelina Pih on 08/31/2022 16:35:18

## 2022-09-02 DIAGNOSIS — E11621 Type 2 diabetes mellitus with foot ulcer: Secondary | ICD-10-CM | POA: Diagnosis not present

## 2022-09-05 ENCOUNTER — Encounter: Payer: Medicaid Other | Admitting: Internal Medicine

## 2022-09-05 DIAGNOSIS — E11621 Type 2 diabetes mellitus with foot ulcer: Secondary | ICD-10-CM | POA: Diagnosis not present

## 2022-09-05 NOTE — Progress Notes (Addendum)
JOAKIM, HUESMAN ([id number]) [id number] Page 1 of 5 Visit Report for 09/02/2022 Arrival Information Details Patient Name: Date of Service: Martin Mullen, Martin TTY L. 09/02/2022 9:00 A M Medical Record Number: [id number] Patient Account Number: [id number] Date of Birth/Sex: Treating RN: [birth year] (42 y.Martin. Judie Petit) Yevonne Pax Primary Care Erykah Lippert: [name] Other Clinician: Referring Karrah Mangini: Treating Gradie Butrick/Extender: Carron Curie in Treatment: 1 Visit Information History Since Last Visit Added or deleted any medications: No Patient Arrived: Ambulatory Any new allergies or adverse reactions: No Arrival Time: 08:30 Had a fall or experienced change in No Accompanied By: friend activities of daily living that may affect Transfer Assistance: None risk of falls: Patient Identification Verified: Yes Signs or symptoms of abuse/neglect since last visito No Secondary Verification Process Completed: Yes Hospitalized since last visit: No Patient Requires Transmission-Based Precautions: No Implantable device outside of the clinic excluding No Patient Has Alerts: Yes cellular tissue based products placed in the center Patient Alerts: Patient on Blood Thinner since last visit: Eliquis Has Dressing in Place as Prescribed: Yes Diabetic Type 2 Pain Present Now: No Electronic Signature(s) Signed: 09/05/2022 9:14:03 AM By: Yevonne Pax RN Entered By: Yevonne Pax on 09/05/2022 09:14:03 -------------------------------------------------------------------------------- Clinic Level of Care Assessment Details Patient Name: Date of Service: Martin Mullen, Martin TTY L. 09/02/2022 9:00 A M Medical Record Number: [id number] Patient Account Number: [id number] Date of Birth/Sex: Treating RN: 11/15/1980 (42 y.Martin. [name] Primary Care Dnaiel Voller: [name] Other Clinician: Referring Yair Dusza: Treating Koraima Albertsen/Extender: Carron Curie in  Treatment: 1 Clinic Level of Care Assessment Items TOOL 4 Quantity Score X- 1 0 Use when only an EandM is performed on FOLLOW-UP visit ASSESSMENTS - Nursing Assessment / Reassessment X- 1 10 Reassessment of Co-morbidities (includes updates in patient status) X- 1 5 Reassessment of Adherence to Treatment Plan Martin Mullen, Martin Mullen ([id number]) [id number] Page 2 of 5 ASSESSMENTS - Wound and Skin A ssessment / Reassessment X - Simple Wound Assessment / Reassessment - one wound 1 5 []  - 0 Complex Wound Assessment / Reassessment - multiple wounds []  - 0 Dermatologic / Skin Assessment (not related to wound area) ASSESSMENTS - Focused Assessment []  - 0 Circumferential Edema Measurements - multi extremities []  - 0 Nutritional Assessment / Counseling / Intervention []  - 0 Lower Extremity Assessment (monofilament, tuning fork, pulses) []  - 0 Peripheral Arterial Disease Assessment (using hand held doppler) ASSESSMENTS - Ostomy and/or Continence Assessment and Care []  - 0 Incontinence Assessment and Management []  - 0 Ostomy Care Assessment and Management (repouching, etc.) PROCESS - Coordination of Care X - Simple Patient / Family Education for ongoing care 1 15 []  - 0 Complex (extensive) Patient / Family Education for ongoing care []  - 0 Staff obtains Chiropractor, Records, T Results / Process Orders est []  - 0 Staff telephones HHA, Nursing Homes / Clarify orders / etc []  - 0 Routine Transfer to another Facility (non-emergent condition) []  - 0 Routine Hospital Admission (non-emergent condition) []  - 0 New Admissions / Manufacturing engineer / Ordering NPWT Apligraf, etc. , []  - 0 Emergency Hospital Admission (emergent condition) X- 1 10 Simple Discharge Coordination []  - 0 Complex (extensive) Discharge Coordination PROCESS - Special Needs []  - 0 Pediatric / Minor Patient Management []  - 0 Isolation Patient Management []  - 0 Hearing / Language /  Visual special needs []  - 0 Assessment of Community assistance (transportation, D/C planning, etc.) []  - 0 Additional assistance / Altered mentation []  - 0 Support Surface(s) Assessment (  bed, cushion, seat, etc.) INTERVENTIONS - Wound Cleansing / Measurement X - Simple Wound Cleansing - one wound 1 5 []  - 0 Complex Wound Cleansing - multiple wounds []  - 0 Wound Imaging (photographs - any number of wounds) []  - 0 Wound Tracing (instead of photographs) X- 1 5 Simple Wound Measurement - one wound []  - 0 Complex Wound Measurement - multiple wounds INTERVENTIONS - Wound Dressings X - Small Wound Dressing one or multiple wounds 1 10 []  - 0 Medium Wound Dressing one or multiple wounds []  - 0 Large Wound Dressing one or multiple wounds []  - 0 Application of Medications - topical []  - 0 Application of Medications - injection INTERVENTIONS - Miscellaneous []  - 0 External ear exam Martin Mullen, Martin Mullen ([id number]) [id number] Page 3 of 5 []  - 0 Specimen Collection (cultures, biopsies, blood, body fluids, etc.) []  - 0 Specimen(s) / Culture(s) sent or taken to Lab for analysis []  - 0 Patient Transfer (multiple staff / Michiel Sites Lift / Similar devices) []  - 0 Simple Staple / Suture removal (25 or less) []  - 0 Complex Staple / Suture removal (26 or more) []  - 0 Hypo / Hyperglycemic Management (close monitor of Blood Glucose) []  - 0 Ankle / Brachial Index (ABI) - do not check if billed separately X- 1 5 Vital Signs Has the patient been seen at the hospital within the last three years: Yes Total Score: 70 Level Of Care: New/Established - Level 2 Electronic Signature(s) Unsigned Entered ByYevonne Pax on 09/05/2022 09:19:27 -------------------------------------------------------------------------------- Encounter Discharge Information Details Patient Name: Date of Service: Martin Mullen, Martin TTY L. 09/02/2022 9:00 A M Medical Record Number: [id number] Patient  Account Number: [id number] Date of Birth/Sex: Treating RN: 09/13/1980 (42 y.Martin. [name] Primary Care Merlina Marchena: [name] Other Clinician: Referring Parker Wherley: Treating Esteen Delpriore/Extender: Carron Curie in Treatment: 1 Encounter Discharge Information Items Discharge Condition: Stable Ambulatory Status: Ambulatory Discharge Destination: Home Transportation: Private Auto Accompanied By: self Schedule Follow-up Appointment: Yes Clinical Summary of Care: Electronic Signature(s) Signed: 09/05/2022 9:18:48 AM By: Yevonne Pax RN Entered By: Yevonne Pax on 09/05/2022 09:18:48 Wound Assessment Details -------------------------------------------------------------------------------- Martin Mullen ([id number]) [id number] Page 4 of 5 Patient Name: Date of Service: Martin Mullen, Martin TTY L. 09/02/2022 9:00 A M Medical Record Number: [id number] Patient Account Number: [id number] Date of Birth/Sex: Treating RN: April 22, 1980 (42 y.Martin. Judie Petit) Yevonne Pax Primary Care Tatym Schermer: [name] Other Clinician: Referring Jeven Topper: Treating Kyden Potash/Extender: Carron Curie in Treatment: 1 Wound Status Wound Number: 2 Primary Dehisced Wound Etiology: Wound Location: Right, Midline T Second oe Wound Open Wounding Event: Surgical Injury Status: Date Acquired: 06/30/2022 Comorbid Coronary Artery Disease, Hypertension, Myocardial Weeks Of Treatment: 1 History: Infarction, Type II Diabetes Clustered Wound: No Pending Amputation On Presentation Wound Measurements Length: (cm) 4 % Reduction in Area: -981.2% Width: (cm) 7 % Reduction in Volume: -1101.2% Depth: (cm) 4 Tunneling: No Area: (cm) 21.991 Undermining: No Volume: (cm) 87.965 Wound Description Classification: Full Thickness With Exposed Support Structures Foul Odor After Cleansing: No Exudate Amount: Medium Slough/Fibrino Yes Exudate Type: Serosanguineous Exudate Color:  red, brown Wound Bed Granulation Amount: Medium (34-66%) Exposed Structure Granulation Quality: Red, Pink Fascia Exposed: No Necrotic Amount: Medium (34-66%) Fat Layer (Subcutaneous Tissue) Exposed: Yes Necrotic Quality: Adherent Slough Tendon Exposed: No Muscle Exposed: No Joint Exposed: No Bone Exposed: No Treatment Notes Wound #2 (Toe Second) Wound Laterality: Right, Midline Cleanser Soap and Water Discharge Instruction: Gently cleanse wound with antibacterial soap, rinse and  pat dry prior to dressing wounds Vashe 5.8 (oz) Discharge Instruction: Use vashe 5.8 (oz) as directed Peri-Wound Care Topical Primary Dressing Hydrofera Blue Ready Transfer Foam, 2.5x2.5 (in/in) Discharge Instruction: Apply Hydrofera Blue Ready to wound bed as directed Hydrofera Blue Classic Foam Rope Dressing, 9x6 (mm/in) Discharge Instruction: inserted into wound cavity Secondary Dressing ABD Pad 5x9 (in/in) Discharge Instruction: Cover with ABD pad Gauze Discharge Instruction: As directed: dry Secured With Medipore T - 65M Medipore H Soft Cloth Surgical T ape ape, 2x2 (in/yd) Compression Wrap Compression Stockings Add-Ons Electronic Signature(s) Martin Mullen, Martin Mullen ([id number]) [phone] Page 5 of 5 Signed: 09/05/2022 9:14:29 AM By: Yevonne Pax RN Entered By: Yevonne Pax on 09/05/2022 09:14:29

## 2022-09-06 NOTE — Progress Notes (Signed)
ZALE, MARCOTTE ([id number]) [id number] Page 1 of 7 Visit Report for 09/05/2022 HPI Details Patient Name: Date of Service: Martin Mullen ER, [name] 09/05/2022 3:45 PM Medical Record Number: [id number] Patient Account Number: [id number] Date of Birth/Sex: Treating RN: 1980-06-04 (42 y.o. [name] Primary Care Provider: [name] Other Clinician: Referring Provider: Treating Provider/Extender: RO BSO N, MICHA EL Vinnie Level, Regina Weeks in Treatment: 2 History of Present Illness HPI Description: 10/11/18 patient presents today for initial evaluation our clinic. He is being seen due to a left great toe ulcer which unfortunately has been present for quite some time. Initially he thought it really wasn't much of anything and then subsequently noted that he had some drainage then that seem to dry off and he didn't worry too much about it until more recently when he is had issues. 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 Subsequently he tells me he has not utilized any recreational drugs for two years. This is obviously excellent news. He also does have diabetes type II. It does sound like he is trying to improve things as far as his overall health status is concerned although his hemoglobin A-1 C as measured on 09/27/18 was 10.7. He did have an x-ray as well on 09/28/18 and this was negative for any evidence of acute or chronic osteomyelitis or fracture. Patient's wound is not appear to be severely infected at this time which is good news. With that being said he also doesn't really have any pain this is secondary to neuropathy. No fevers, chills, nausea, or vomiting noted at this time. 10/18/2018 on evaluation today patient actually appears to be doing better with regard to his toe ulcer. He has been tolerating the dressing changes without complication.  Fortunately there is no signs of active infection at this time. Overall very pleased in this regard and how things are doing. Patient likewise is pleased and happy to see that things are doing much better. Still he does have some ways to go to get this area to completely close. 10/30/2018 on evaluation today patient's ulcer on his plantar foot actually appears to be doing quite well at this time. He has been tolerating the dressing changes without complication which is excellent news. With that being said I do believe he is making progress there is some callus buildup on without the clear away today but other than that I am seeing evidence that this seems to be filling in from the base and is getting much smaller in the base. Still were not at the point of complete closure yet. 11/09/2018 on evaluation today patient actually appears to be doing quite well with regard to his toe ulcer from the standpoint of infection. I do not see any signs of active infection at this time which is good news. With that being said he unfortunately is not really making much progress either he continues to have depth of the wound despite the fact that it seems to be callused over initially on inspection when I probed and actually remove some of the callus it is much deeper. Fortunately there is no signs of active infection at this time. 11/16/2018 on evaluation today patient's toe ulcer actually appears to be doing about the same. There is really no significant improvement compared to last time I saw him. There is some debridement necessary today he has callus covering the  wound area I think he may be a good candidate for total contact cast to rather rapidly get this area to close. We previously have discussed this although we have not initiated it yet I think it may be time to do so and go ahead and get this closed. 11/22/2018 on evaluation today patient appears to be doing better with regard to the overall size of his wound.  He has been tolerating the dressing changes without complication. Fortunately there is no signs of active infection at this time. No fevers, chills, nausea, vomiting, or diarrhea. 11/29/2018 on evaluation today patient appears to be doing about the same really with regard to his toe ulcer. He is not showing any signs of dramatic improvement unfortunately. There is no signs of infection at this time. No fevers, chills, nausea, vomiting, or diarrhea. I really think we need to consider the total contact cast at this point in order to go ahead and get this closed. 12/04/2018 on evaluation today patient appears to be doing about the same with regard to his great toe ulcer. He is here today for the initiation of the total contact cast. Unfortunately he notes he did clip his toenail somewhat short on the second toe of the same foot he does have a slight paronychia noted at this point. There is some erythema is not spreading into the foot if just in the distal portion of the toe there is a little bit of evidence of fluid/pus buildup at the toenail margin. Nonetheless this is something that I definitely think we probably should treat just to make sure nothing gets worse while we apply the cast he still wants to apply the cast today we will be seeing him back in 2 days anyway to reapply the cast so I do not see any problems in that regard. 12/06/2018 on evaluation today patient appears to be measuring smaller with regard to his wound. The total contact cast just in 2 days seems to have already helped he is here for repeat application today. Fortunately there is no signs of active infection at this time. No fevers, chills, nausea, vomiting, or diarrhea. He did unfortunately get the cast wet this morning taking a shower he attempted to use a trash bag and it failed. I believe he should get a cast protector which would be better as far as helping to avoid this from happening again. 10/29; the patient had a raised  surface eschar over the wound area. Gently debrided with a #5 curette. The vast majority of this had closed however he had 1 small open area that still had perhaps 2 mm of depth. With considerable difficulty I was able to convince him to put the cast on for another few days. The option would have been a Pegasys shoe. 12/18/2018 upon evaluation today patient appears to be doing very well with regard to his wound on the toe. In fact this appears to be completely healed which is great news. There is no evidence of active infection at this time which is also good news. No fevers, chills, nausea, vomiting, or diarrhea. Overall the patient is very pleased with how things seem to have progressed. Readmission: 08-22-2022 this is a gentleman whom I have seen previously. In fact it was last in November 2020 that I saw him. Since that time apparently there is quite a bit that has occurred in fact he has had 2 strokes as well as before I knew he had had a heart attack but unfortunately the  strokes have altered his capacity as far as both the mental side is concerned with speech as well as even remembering things and it also affected his motor function especially in his hands. His BASIM, BARTNIK ([id number]) [id number] Page 2 of 7 walk is also not as good as it was. Nonetheless he is seen today as a referral from Dr. Allena Katz at [facility]. Dr. Allena Katz did do surgery on this gentleman after he had revascularization and May on the 15th of 2024. Subsequently after the revascularization and stent placement by Dr. Wyn Quaker Dr. 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 Unfortunately it did not remain closed and dehisced presumably due to the fact that the patient states he was walking on it. He really does not have any evidence at this point of systemic infection which is good news but  unfortunately the wound is doing quite poorly. He also has a big area of callus on the left foot this can require debridement today there may be a wound under this as well but appear. 08-29-23 upon evaluation today patient unfortunately appears to be doing not so well in regard to his foot ulcer. Since I last saw him he has not had the dressing changed even 1 time. He had matted hair which apparently is Here in the wound bed which required quite a bit of cleaning to clear out by the nurse prior to my coming in. With that being said I think that if he is not careful he is at high risk for losing his leg if this becomes subsequently infected. I discussed that with him today. Unfortunately he had home health but since we sent him orders for wound care they discharged him stating "they did not have any staff". Again this is something that to be peripherally honest we run into many times with patients with Medicaid or insurance carriers I do not pay well for home health services that we will be told they do not have "staph" when really my assumption is they do not want to take on the patient as they will accept other patients with Medicare otherwise. There is only a small number of Medicaid patients that it will help company will take on. 7/22;. 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 This has considerable depth. We have been using Hydrofera Blue packing rope and he is coming in once a week to see the provider and having to nurse visits. He is apparently not eligible for home health and Medicaid is the primary insurance. Electronic Signature(s) Signed: 09/06/2022 4:43:46 PM By: Baltazar Najjar MD Entered By: Baltazar Najjar on 09/05/2022 16:07:41 -------------------------------------------------------------------------------- Physical Exam Details Patient Name: Date of Service: Martin Mullen ER, [name] 09/05/2022 3:45 PM Medical Record Number: [id number] Patient Account  Number: [id number] Date of Birth/Sex: Treating RN: May 24, 1980 (42 y.o. [name] Primary Care Provider: [name] Other Clinician: Referring Provider: Treating Provider/Extender: RO BSO N, MICHA EL Vinnie Level, Regina Weeks in Treatment: 2 Constitutional Sitting or standing Blood Pressure is within target range for patient.. Pulse regular and within target range for patient.Marland Kitchen Respirations regular, non-labored and within target range.. Temperature is normal and within the target range for the patient.Marland Kitchen appears in no distress. Notes Wound exam; the wound itself appears to have come in in terms of depth from 4 to 3-1/2 cm. This is a linear wound  with some depth granulation tissue looks healthy. there is no evidence of infection no purulence no periwound infection Electronic Signature(s) Signed: 09/06/2022 4:43:46 PM By: Baltazar Najjar MD Entered By: Baltazar Najjar on 09/05/2022 16:08:55 -------------------------------------------------------------------------------- Physician Orders Details Patient Name: Date of Service: Martin Mullen ER, [name] 09/05/2022 3:45 PM Medical Record Number: [id number] Patient Account Number: [id number] ORDEAN, FOUTS ([id number]) [id number] Page 3 of 7 Date of Birth/Sex: Treating RN: September 28, 1980 (42 y.o. Judie Petit) Yevonne Pax Primary Care Provider: Other Clinician: [name] Referring Provider: Treating Provider/Extender: RO BSO Dorris Carnes, MICHA EL Vinnie Level, Regina Weeks in Treatment: 2 Verbal / Phone Orders: No Diagnosis Coding Follow-up Appointments Return Appointment in 1 week. Nurse Visit as needed - Wednesday and Friday Bathing/ [employer] wounds with antibacterial soap and water. May shower; gently cleanse wound with antibacterial soap, rinse and pat dry prior to dressing wounds No tub bath. Anesthetic (Use 'Patient Medications' Section for Anesthetic Order Entry) Lidocaine applied to wound bed Edema Control -  Lymphedema / Segmental Compressive Device / Other Elevate, Exercise Daily and A void Standing for Long Periods of Time. Elevate legs to the level of the heart and pump ankles as often as possible Elevate leg(s) parallel to the floor when sitting. Off-Loading Open toe surgical shoe - Bilateral. Wound Treatment Wound #2 - T Second oe Wound Laterality: Right, Midline Cleanser: Soap and Water (Home Health) 3 x Per Week/30 Days Discharge Instructions: Gently cleanse wound with antibacterial soap, rinse and pat dry prior to dressing wounds Cleanser: Vashe 5.8 (oz) (Home Health) 3 x Per Week/30 Days Discharge Instructions: Use vashe 5.8 (oz) as directed Prim Dressing: Hydrofera Blue Ready Transfer Foam, 2.5x2.5 (in/in) (Home Health) 3 x Per Week/30 Days ary Discharge Instructions: Apply Hydrofera Blue Ready to wound bed as directed Prim Dressing: Hydrofera Blue Classic Foam Rope Dressing, 9x6 (mm/in) ary 3 x Per Week/30 Days Discharge Instructions: inserted into wound cavity Secondary Dressing: ABD Pad 5x9 (in/in) 3 x Per Week/30 Days Discharge Instructions: Cover with ABD pad Secondary Dressing: Gauze 3 x Per Week/30 Days Discharge Instructions: As directed: dry Secured With: Medipore T - 41M Medipore H Soft Cloth Surgical T ape ape, 2x2 (in/yd) 3 x Per Week/30 Days Electronic Signature(s) Signed: 09/06/2022 4:16:14 PM By: Yevonne Pax RN Signed: 09/06/2022 4:43:46 PM By: Baltazar Najjar MD Entered By: Yevonne Pax on 09/05/2022 16:00:44 -------------------------------------------------------------------------------- Problem List Details Patient Name: Date of Service: Martin Mullen ER, [name] 09/05/2022 3:45 PM Medical Record Number: [id number] Patient Account Number: [id number] Date of Birth/Sex: Treating RN: 1980/06/03 (42 y.o. [name] Primary Care Provider: [name] Other Clinician: INIKO, ROBLES ([id number]) [id number] Page 4 of 7 Referring  Provider: Treating Provider/Extender: RO BSO N, MICHA EL Vinnie Level, Regina Weeks in Treatment: 2 Active Problems ICD-10 Encounter Code Description Active Date MDM Diagnosis E11.621 Type 2 diabetes mellitus with foot ulcer 08/22/2022 No Yes L97.522 Non-pressure chronic ulcer of other part of left foot with fat layer exposed 08/22/2022 No Yes T81.31XA Disruption of external operation (surgical) wound, not elsewhere classified, 08/22/2022 No Yes initial encounter L97.512 Non-pressure chronic ulcer of other part of right foot with fat layer exposed 08/22/2022 No Yes I10 Essential (primary) hypertension 08/22/2022 No Yes I25.10 Atherosclerotic heart disease of native coronary artery without angina pectoris 08/22/2022 No Yes Inactive Problems Resolved Problems Electronic Signature(s) Signed: 09/06/2022 4:43:46 PM By: Baltazar Najjar MD Entered By: Baltazar Najjar on 09/05/2022 16:05:58 -------------------------------------------------------------------------------- Progress Note Details Patient Name: Date of Service: Martin Mullen ER,  [name] 09/05/2022 3:45 PM Medical Record Number: [id number] Patient Account Number: [id number] Date of Birth/Sex: Treating RN: 1980/12/26 (42 y.o. [name] Primary Care Provider: [name] Other Clinician: Referring Provider: Treating Provider/Extender: RO BSO N, MICHA EL Vinnie Level, Regina Weeks in Treatment: 2 Subjective History of Present Illness (HPI) 10/11/18 patient presents today for initial evaluation our clinic. He is being seen due to a left great toe ulcer which unfortunately has been present for quite some time. Initially he thought it really wasn't much of anything and then subsequently noted that he had some drainage then that seem to dry off and he didn't worry too much about it until more recently when he is had issues. 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 Subsequently he tells me he has not utilized any recreational drugs for two years. This is obviously excellent news. He also does have diabetes type II. It does sound like he is trying to improve things as far as his overall health status is concerned although his hemoglobin A-1 C as measured on 09/27/18 was 10.7. He did have an x-ray as well on 09/28/18 and this was negative for any evidence of acute or chronic osteomyelitis or fracture. Patient's wound is not appear to be severely infected at this time which is good news. With that being said he SELIM, DURDEN ([id number]) [id number] Page 5 of 7 also doesn't really have any pain this is secondary to neuropathy. No fevers, chills, nausea, or vomiting noted at this time. 10/18/2018 on evaluation today patient actually appears to be doing better with regard to his toe ulcer. He has been tolerating the dressing changes without complication. Fortunately there is no signs of active infection at this time. Overall very pleased in this regard and how things are doing. Patient likewise is pleased and happy to see that things are doing much better. Still he does have some ways to go to get this area to completely close. 10/30/2018 on evaluation today patient's ulcer on his plantar foot actually appears to be doing quite well at this time. He has been tolerating the dressing changes without complication which is excellent news. With that being said I do believe he is making progress there is some callus buildup on without the clear away today but other than that I am seeing evidence that this seems to be filling in from the base and is getting much smaller in the base. Still were not at the point of complete closure yet. 11/09/2018 on evaluation today patient actually appears to be doing quite well with regard to his toe ulcer from the standpoint of infection. I do not see any signs of active infection at this  time which is good news. With that being said he unfortunately is not really making much progress either he continues to have depth of the wound despite the fact that it seems to be callused over initially on inspection when I probed and actually remove some of the callus it is much deeper. Fortunately there is no signs of active infection at this time. 11/16/2018 on evaluation today patient's toe ulcer actually appears to be doing about the same. There is really no significant improvement compared to last time I saw him. There is some debridement necessary today he has callus covering the wound area I think he may be a good candidate for total contact  cast to rather rapidly get this area to close. We previously have discussed this although we have not initiated it yet I think it may be time to do so and go ahead and get this closed. 11/22/2018 on evaluation today patient appears to be doing better with regard to the overall size of his wound. He has been tolerating the dressing changes without complication. Fortunately there is no signs of active infection at this time. No fevers, chills, nausea, vomiting, or diarrhea. 11/29/2018 on evaluation today patient appears to be doing about the same really with regard to his toe ulcer. He is not showing any signs of dramatic improvement unfortunately. There is no signs of infection at this time. No fevers, chills, nausea, vomiting, or diarrhea. I really think we need to consider the total contact cast at this point in order to go ahead and get this closed. 12/04/2018 on evaluation today patient appears to be doing about the same with regard to his great toe ulcer. He is here today for the initiation of the total contact cast. Unfortunately he notes he did clip his toenail somewhat short on the second toe of the same foot he does have a slight paronychia noted at this point. There is some erythema is not spreading into the foot if just in the distal portion of  the toe there is a little bit of evidence of fluid/pus buildup at the toenail margin. Nonetheless this is something that I definitely think we probably should treat just to make sure nothing gets worse while we apply the cast he still wants to apply the cast today we will be seeing him back in 2 days anyway to reapply the cast so I do not see any problems in that regard. 12/06/2018 on evaluation today patient appears to be measuring smaller with regard to his wound. The total contact cast just in 2 days seems to have already helped he is here for repeat application today. Fortunately there is no signs of active infection at this time. No fevers, chills, nausea, vomiting, or diarrhea. He did unfortunately get the cast wet this morning taking a shower he attempted to use a trash bag and it failed. I believe he should get a cast protector which would be better as far as helping to avoid this from happening again. 10/29; the patient had a raised surface eschar over the wound area. Gently debrided with a #5 curette. The vast majority of this had closed however he had 1 small open area that still had perhaps 2 mm of depth. With considerable difficulty I was able to convince him to put the cast on for another few days. The option would have been a Pegasys shoe. 12/18/2018 upon evaluation today patient appears to be doing very well with regard to his wound on the toe. In fact this appears to be completely healed which is great news. There is no evidence of active infection at this time which is also good news. No fevers, chills, nausea, vomiting, or diarrhea. Overall the patient is very pleased with how things seem to have progressed. Readmission: 08-22-2022 this is a gentleman whom I have seen previously. In fact it was last in November 2020 that I saw him. Since that time apparently there is quite a bit that has occurred in fact he has had 2 strokes as well as before I knew he had had a heart attack but  unfortunately the strokes have altered his capacity as far as both the mental side is  concerned with speech as well as even remembering things and it also affected his motor function especially in his hands. His walk is also not as good as it was. Nonetheless he is seen today as a referral from Dr. Allena Katz at [facility]. Dr. Allena Katz did do surgery on this gentleman after he had revascularization and May on the 15th of 2024. Subsequently after the revascularization and stent placement by Dr. Wyn Quaker Dr. 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 Unfortunately it did not remain closed and dehisced presumably due to the fact that the patient states he was walking on it. He really does not have any evidence at this point of systemic infection which is good news but unfortunately the wound is doing quite poorly. He also has a big area of callus on the left foot this can require debridement today there may be a wound under this as well but appear. 08-29-23 upon evaluation today patient unfortunately appears to be doing not so well in regard to his foot ulcer. Since I last saw him he has not had the dressing changed even 1 time. He had matted hair which apparently is Here in the wound bed which required quite a bit of cleaning to clear out by the nurse prior to my coming in. With that being said I think that if he is not careful he is at high risk for losing his leg if this becomes subsequently infected. I discussed that with him today. Unfortunately he had home health but since we sent him orders for wound care they discharged him stating "they did not have any staff". Again this is something that to be peripherally honest we run into many times with patients with Medicaid or insurance carriers I do not pay well for home health services that we will be told they do not have "staph" when really my assumption is  they do not want to take on the patient as they will accept other patients with Medicare otherwise. There is only a small number of Medicaid patients that it will help company will take on. 7/22;. 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 This has considerable depth. We have been using Hydrofera Blue packing rope and he is coming in once a week to see the provider and having to nurse visits. He is apparently not eligible for home health and Medicaid is the primary insurance. Objective Constitutional Sitting or standing Blood Pressure is within target range for patient.. Pulse regular and within target range for patient.Marland Kitchen Respirations regular, non-labored and within target range.. Temperature is normal and within the target range for the patient.Marland Kitchen appears in no distress. Vitals Time Taken: 3:50 PM, Temperature: 97.3 F, Pulse: 90 bpm, Respiratory Rate: 18 breaths/min, Blood Pressure: 110/70 mmHg. General Notes: Wound exam; the wound itself appears to have come in in terms of depth from 4 to 3-1/2 cm. This is a linear wound with some depth ELIJHA, DEDMAN ([id number]) [id number] Page 6 of 7 granulation tissue looks healthy. there is no evidence of infection no purulence no periwound infection Integumentary (Hair, Skin) Wound #2 status is Open. Original cause of wound was Surgical Injury. The date acquired was: 06/30/2022. The wound has been in treatment 2 weeks. The wound is located on the Right,Midline T Second. The wound measures 3.2cm length x 0.5cm width x 3.5cm depth; 1.257cm^2 area and 4.398cm^3  volume. oe There is Fat Layer (Subcutaneous Tissue) exposed. There is no tunneling or undermining noted. There is a medium amount of serosanguineous drainage noted. There is medium (34-66%) red, pink granulation within the wound bed. There is a medium (34-66%) amount of necrotic tissue within the wound bed including Adherent  Slough. Assessment Active Problems ICD-10 Type 2 diabetes mellitus with foot ulcer Non-pressure chronic ulcer of other part of left foot with fat layer exposed Disruption of external operation (surgical) wound, not elsewhere classified, initial encounter Non-pressure chronic ulcer of other part of right foot with fat layer exposed Essential (primary) hypertension Atherosclerotic heart disease of native coronary artery without angina pectoris Plan Follow-up Appointments: Return Appointment in 1 week. Nurse Visit as needed - Wednesday and Friday Bathing/ Shower/ Hygiene: Wash wounds with antibacterial soap and water. May shower; gently cleanse wound with antibacterial soap, rinse and pat dry prior to dressing wounds No tub bath. Anesthetic (Use 'Patient Medications' Section for Anesthetic Order Entry): Lidocaine applied to wound bed Edema Control - Lymphedema / Segmental Compressive Device / Other: Elevate, Exercise Daily and Avoid Standing for Long Periods of Time. Elevate legs to the level of the heart and pump ankles as often as possible Elevate leg(s) parallel to the floor when sitting. Off-Loading: Open toe surgical shoe - Bilateral. WOUND #2: - T Second Wound Laterality: Right, Midline oe Cleanser: Soap and Water (Home Health) 3 x Per Week/30 Days Discharge Instructions: Gently cleanse wound with antibacterial soap, rinse and pat dry prior to dressing wounds Cleanser: Vashe 5.8 (oz) (Home Health) 3 x Per Week/30 Days Discharge Instructions: Use vashe 5.8 (oz) as directed Prim Dressing: Hydrofera Blue Ready Transfer Foam, 2.5x2.5 (in/in) (Home Health) 3 x Per Week/30 Days ary Discharge Instructions: Apply Hydrofera Blue Ready to wound bed as directed Prim Dressing: Hydrofera Blue Classic Foam Rope Dressing, 9x6 (mm/in) 3 x Per Week/30 Days ary Discharge Instructions: inserted into wound cavity Secondary Dressing: ABD Pad 5x9 (in/in) 3 x Per Week/30 Days Discharge  Instructions: Cover with ABD pad Secondary Dressing: Gauze 3 x Per Week/30 Days Discharge Instructions: As directed: dry Secured With: Medipore T - 52M Medipore H Soft Cloth Surgical T ape ape, 2x2 (in/yd) 3 x Per Week/30 Days 1. There is no reason to change the current dressing which is Hydrofera Blue packing rope. 2. Unfortunately not a insurance candidate for an advanced treatment product. #3 no current evidence of infection. This does not probe to bone Electronic Signature(s) Signed: 09/06/2022 4:43:46 PM By: Baltazar Najjar MD Entered By: Baltazar Najjar on 09/05/2022 16:10:04 Mayo Ao ([id number]) [id number] Page 7 of 7 -------------------------------------------------------------------------------- SuperBill Details Patient Name: Date of Service: [name], SCO TTY Elbert Ewings 09/05/2022 Medical Record Number: [id number] Patient Account Number: [id number] Date of Birth/Sex: Treating RN: [birth year] (42 y.o. Judie Petit) Yevonne Pax Primary Care Provider: [name] Other Clinician: Referring Provider: Treating Provider/Extender: RO BSO N, MICHA EL Vinnie Level, Regina Weeks in Treatment: 2 Diagnosis Coding ICD-10 Codes Code Description [phone] Type 2 diabetes mellitus with foot ulcer L97.522 Non-pressure chronic ulcer of other part of left foot with fat layer exposed T81.31XA Disruption of external operation (surgical) wound, not elsewhere classified, initial encounter L97.512 Non-pressure chronic ulcer of other part of right foot with fat layer exposed I10 Essential (primary) hypertension I25.10 Atherosclerotic heart disease of native coronary artery without angina pectoris Facility Procedures : CPT4 Code: [id number] Description: 84696 - WOUND CARE VISIT-LEV 2 EST PT Modifier: Quantity: 1 Physician Procedures : CPT4 Code Description Modifier [id number] [phone] -  WC PHYS LEVEL 3 - EST PT ICD-10 Diagnosis Description E11.621 Type 2 diabetes mellitus with foot ulcer  L97.522 Non-pressure chronic ulcer of other part of left foot with fat layer exposed Quantity: 1 Electronic Signature(s) Signed: 09/06/2022 4:43:46 PM By: Baltazar Najjar MD Previous Signature: 09/05/2022 4:09:07 PM Version By: Yevonne Pax RN Entered By: Baltazar Najjar on 09/05/2022 16:10:32

## 2022-09-06 NOTE — Progress Notes (Signed)
Martin Mullen ([id number]) [id number] Page 1 of 9 Visit Report for 09/05/2022 Arrival Information Details Patient Name: Date of Service: Martin Mullen Mullen, [name] 09/05/2022 3:45 PM Medical Record Number: [id number] Patient Account Number: [id number] Date of Birth/Sex: Treating RN: Mar 31, 1980 (42 y.Martin. Martin Mullen) Yevonne Pax Primary Care Barlow Harrison: Martin Mullen Other Clinician: Referring Martin Mullen: Treating Kathalene Sporer/Extender: Martin Mullen, Martin Mullen, Martin Mullen in Treatment: 2 Visit Information History Since Last Visit Added or deleted any medications: No Patient Arrived: Ambulatory Any new allergies or adverse reactions: No Arrival Time: 15:50 Had a fall or experienced change in No Accompanied By: self activities of daily living that may affect Transfer Assistance: None risk of falls: Patient Identification Verified: Yes Signs or symptoms of abuse/neglect since last visito No Secondary Verification Process Completed: Yes Hospitalized since last visit: No Patient Requires Transmission-Based Precautions: No Implantable device outside of the clinic excluding No Patient Has Alerts: Yes cellular tissue based products placed in the center Patient Alerts: Patient on Blood Thinner since last visit: Eliquis Has Dressing in Place as Prescribed: Yes Diabetic Type 2 Pain Present Now: No Electronic Signature(s) Signed: 09/06/2022 4:16:14 PM By: Yevonne Pax RN Entered By: Yevonne Pax on 09/05/2022 15:50:39 -------------------------------------------------------------------------------- Clinic Mullen of Care Assessment Details Patient Name: Date of Service: Martin Mullen, [name] 09/05/2022 3:45 PM Medical Record Number: [id number] Patient Account Number: [id number] Date of Birth/Sex: Treating RN: 06/20/1980 (42 y.Martin. Martin Mullen: Martin Mullen Other Clinician: Referring Martin Mullen: Treating Martin Mullen: Martin Mullen, Martin Mullen,  Martin Mullen in Treatment: 2 Clinic Mullen of Care Assessment Items TOOL 4 Quantity Score []  - 0 Use when only an EandM is performed on FOLLOW-UP visit ASSESSMENTS - Nursing Assessment / Reassessment X- 1 10 Reassessment of Co-morbidities (includes updates in patient status) X- 1 5 Reassessment of Adherence to Treatment Plan HARM, JOU ([id number]) [id number] Page 2 of 9 ASSESSMENTS - Wound and Skin A ssessment / Reassessment X - Simple Wound Assessment / Reassessment - one wound 1 5 []  - 0 Complex Wound Assessment / Reassessment - multiple wounds []  - 0 Dermatologic / Skin Assessment (not related to wound area) ASSESSMENTS - Focused Assessment []  - 0 Circumferential Edema Measurements - multi extremities []  - 0 Nutritional Assessment / Counseling / Intervention []  - 0 Lower Extremity Assessment (monofilament, tuning fork, pulses) []  - 0 Peripheral Arterial Disease Assessment (using hand held doppler) ASSESSMENTS - Ostomy and/or Continence Assessment and Care []  - 0 Incontinence Assessment and Management []  - 0 Ostomy Care Assessment and Management (repouching, etc.) PROCESS - Coordination of Care X - Simple Patient / Family Education for ongoing care 1 15 []  - 0 Complex (extensive) Patient / Family Education for ongoing care []  - 0 Staff obtains Chiropractor, Records, T Results / Process Orders est []  - 0 Staff telephones HHA, Nursing Homes / Clarify orders / etc []  - 0 Routine Transfer to another Facility (non-emergent condition) []  - 0 Routine Hospital Admission (non-emergent condition) []  - 0 New Admissions / Manufacturing engineer / Ordering NPWT Apligraf, etc. , []  - 0 Emergency Hospital Admission (emergent condition) X- 1 10 Simple Discharge Coordination []  - 0 Complex (extensive) Discharge Coordination PROCESS - Special Needs []  - 0 Pediatric / Minor Patient Management []  - 0 Isolation Patient Management []  - 0 Hearing /  Language / Visual special needs []  - 0 Assessment of Community assistance (transportation, D/C planning, etc.) []  - 0 Additional assistance / Altered mentation []  -  0 Support Surface(s) Assessment (bed, cushion, seat, etc.) INTERVENTIONS - Wound Cleansing / Measurement X - Simple Wound Cleansing - one wound 1 5 []  - 0 Complex Wound Cleansing - multiple wounds X- 1 5 Wound Imaging (photographs - any number of wounds) []  - 0 Wound Tracing (instead of photographs) X- 1 5 Simple Wound Measurement - one wound []  - 0 Complex Wound Measurement - multiple wounds INTERVENTIONS - Wound Dressings X - Small Wound Dressing one or multiple wounds 1 10 []  - 0 Medium Wound Dressing one or multiple wounds []  - 0 Large Wound Dressing one or multiple wounds []  - 0 Application of Medications - topical []  - 0 Application of Medications - injection INTERVENTIONS - Miscellaneous []  - 0 External ear exam Martin Mullen, Martin Mullen ([id number]) [id number] Page 3 of 9 []  - 0 Specimen Collection (cultures, biopsies, blood, body fluids, etc.) []  - 0 Specimen(s) / Culture(s) sent or taken to Lab for analysis []  - 0 Patient Transfer (multiple staff / Michiel Sites Lift / Similar devices) []  - 0 Simple Staple / Suture removal (25 or less) []  - 0 Complex Staple / Suture removal (26 or more) []  - 0 Hypo / Hyperglycemic Management (close monitor of Blood Glucose) []  - 0 Ankle / Brachial Index (ABI) - do not check if billed separately X- 1 5 Vital Signs Has the patient been seen at the hospital within the last three years: Yes Total Score: 75 Mullen Of Care: New/Established - Mullen 2 Electronic Signature(s) Signed: 09/06/2022 4:16:14 PM By: Yevonne Pax RN Entered By: Yevonne Pax on 09/05/2022 16:09:01 -------------------------------------------------------------------------------- Encounter Discharge Information Details Patient Name: Date of Service: Martin Mullen Mullen, [name] 09/05/2022 3:45  PM Medical Record Number: [id number] Patient Account Number: [id number] Date of Birth/Sex: Treating RN: [birth year] (42 y.Martin. Martin Mullen Primary Care Chrystel Barefield: Martin Mullen Other Clinician: Referring Jaquarius Seder: Treating Geraldina Parrott/Extender: Martin Mullen, Martin Mullen, Martin Mullen in Treatment: 2 Encounter Discharge Information Items Discharge Condition: Stable Ambulatory Status: Ambulatory Discharge Destination: Home Transportation: Private Auto Accompanied By: self Schedule Follow-up Appointment: Yes Clinical Summary of Care: Electronic Signature(s) Signed: 09/05/2022 4:10:36 PM By: Yevonne Pax RN Entered By: Yevonne Pax on 09/05/2022 16:10:36 -------------------------------------------------------------------------------- Lower Extremity Assessment Details Patient Name: Date of Service: Martin Mullen, [name] 09/05/2022 3:45 PM Martin Mullen ([id number]) [id number] Page 4 of 9 Medical Record Number: [id number] Patient Account Number: [id number] Date of Birth/Sex: Treating RN: 12-May-1980 (42 y.Martin. Martin Mullen) Yevonne Pax Primary Care Mylen Mangan: Martin Mullen Other Clinician: Referring Nil Bolser: Treating Jaimin Krupka/Extender: Martin BSO Dorris Carnes, Martin Mullen, Martin Mullen in Treatment: 2 Electronic Signature(s) Signed: 09/06/2022 4:16:14 PM By: Yevonne Pax RN Entered By: Yevonne Pax on 09/05/2022 15:58:02 -------------------------------------------------------------------------------- Multi Wound Chart Details Patient Name: Date of Service: Martin Mullen Mullen, [name] 09/05/2022 3:45 PM Medical Record Number: [id number] Patient Account Number: [id number] Date of Birth/Sex: Treating RN: [birth year] (42 y.Martin. Martin Mullen Primary Care Chiffon Kittleson: Martin Mullen Other Clinician: Referring Dennise Bamber: Treating Ineze Serrao/Extender: Martin Mullen, Martin Mullen, Martin Mullen in Treatment: 2 Vital Signs Height(in): Pulse(bpm): 90 Weight(lbs): Blood Pressure(mmHg): 110/70 Body Mass  Index(BMI): Temperature(F): 97.3 Respiratory Rate(breaths/min): 18 [2:Photos:] [Mullen/A:Mullen/A] Right, Midline T Second oe Mullen/A Mullen/A Wound Location: Surgical Injury Mullen/A Mullen/A Wounding Event: Dehisced Wound Mullen/A Mullen/A Primary Etiology: Coronary Artery Disease, Mullen/A Mullen/A Comorbid History: Hypertension, Myocardial Infarction, Type II Diabetes 06/30/2022 Mullen/A Mullen/A Date Acquired: 2 Mullen/A Mullen/A Mullen of Treatment: Open Mullen/A Mullen/A Wound Status: No Mullen/A Mullen/A Wound Recurrence:  Yes Mullen/A Mullen/A Pending A mputation on Presentation: 3.2x0.5x3.5 Mullen/A Mullen/A Measurements L x W x D (cm) 1.257 Mullen/A Mullen/A A (cm) : rea 4.398 Mullen/A Mullen/A Volume (cm) : 38.20% Mullen/A Mullen/A % Reduction in A rea: 39.90% Mullen/A Mullen/A % Reduction in Volume: Full Thickness With Exposed Support Mullen/A Mullen/A Classification: Structures Medium Mullen/A Mullen/A Exudate Amount: Serosanguineous Mullen/A Mullen/A Exudate Type: red, brown Mullen/A Mullen/A Exudate Color: Medium (34-66%) Mullen/A Mullen/A Granulation Amount: Red, Pink Mullen/A Mullen/A Granulation Quality: Medium (34-66%) Mullen/A Mullen/A Necrotic Amount: Fat Layer (Subcutaneous Tissue): Yes Mullen/A Mullen/A Exposed Structures: Martin Mullen, Martin Mullen ([id number]) [id number] Page 5 of 9 Fascia: No Tendon: No Muscle: No Joint: No Bone: No Treatment Notes Electronic Signature(s) Signed: 09/06/2022 4:16:14 PM By: Yevonne Pax RN Entered By: Yevonne Pax on 09/05/2022 15:58:06 -------------------------------------------------------------------------------- Multi-Disciplinary Care Plan Details Patient Name: Date of Service: Martin Mullen Mullen, [name] 09/05/2022 3:45 PM Medical Record Number: [id number] Patient Account Number: [id number] Date of Birth/Sex: Treating RN: 1980/11/26 (42 y.Martin. Martin Mullen Primary Care Ludivina Guymon: Martin Mullen Other Clinician: Referring Lolah Coghlan: Treating Mollee Neer/Extender: Martin Mullen, Martin Mullen, Martin Mullen in Treatment: 2 Active Inactive Wound/Skin Impairment Nursing Diagnoses: Impaired tissue  integrity Knowledge deficit related to smoking impact on wound healing Knowledge deficit related to ulceration/compromised skin integrity Goals: Patient will demonstrate a reduced rate of smoking or cessation of smoking Date Initiated: 08/22/2022 Target Resolution Date: 09/22/2022 Goal Status: Active Patient/caregiver will verbalize understanding of skin care regimen Date Initiated: 08/22/2022 Target Resolution Date: 09/22/2022 Goal Status: Active Ulcer/skin breakdown will have a volume reduction of 30% by week 4 Date Initiated: 08/22/2022 Target Resolution Date: 09/22/2022 Goal Status: Active Ulcer/skin breakdown will have a volume reduction of 50% by week 8 Date Initiated: 08/22/2022 Target Resolution Date: 10/23/2022 Goal Status: Active Ulcer/skin breakdown will have a volume reduction of 80% by week 12 Date Initiated: 08/22/2022 Target Resolution Date: 11/22/2022 Goal Status: Active Ulcer/skin breakdown will heal within 14 Mullen Date Initiated: 08/22/2022 Target Resolution Date: 12/06/2022 Goal Status: Active Interventions: Assess patient/caregiver ability to obtain necessary supplies Assess patient/caregiver ability to perform ulcer/skin care regimen upon admission and as needed Assess ulceration(s) every visit Provide education on smoking Provide education on ulcer and skin care Treatment Activities: Patient referred to home care : 08/22/2022 Martin Mullen, Martin Mullen ([id number]) [phone].pdf Page 6 of 9 Skin care regimen initiated : 08/22/2022 Notes: Electronic Signature(s) Signed: 09/06/2022 4:16:14 PM By: Yevonne Pax RN Entered By: Yevonne Pax on 09/05/2022 15:58:21 -------------------------------------------------------------------------------- Pain Assessment Details Patient Name: Date of Service: Martin Mullen Mullen, [name] 09/05/2022 3:45 PM Medical Record Number: [id number] Patient Account Number: [id number] Date of Birth/Sex: Treating RN: May 04, 1980 (42 y.Martin. Martin Mullen Primary Care Breionna Punt: Martin Mullen Other Clinician: Referring Chenise Mulvihill: Treating Ijeoma Loor/Extender: Martin Mullen, Martin Mullen, Martin Mullen in Treatment: 2 Active Problems Location of Pain Severity and Description of Pain Patient Has Paino No Site Locations Pain Management and Medication Current Pain Management: Electronic Signature(s) Signed: 09/06/2022 4:16:14 PM By: Yevonne Pax RN Entered By: Yevonne Pax on 09/05/2022 15:51:12 Martin Mullen ([id number]) [id number] Page 7 of 9 -------------------------------------------------------------------------------- Patient/Caregiver Education Details Patient Name: Date of Service: Martin Mullen Mullen, SCO Quinn Axe 7/22/2024andnbsp3:45 PM Medical Record Number: [id number] Patient Account Number: [id number] Date of Birth/Gender: Treating RN: 02-Feb-1981 (42 y.Martin. Martin Mullen Primary Care Physician: Martin Mullen Other Clinician: Referring Physician: Treating Physician/Extender: Martin Mullen, Martin Mullen Sallye Ober in Treatment: 2 Education Assessment Education Provided To: Patient  Education Topics Provided Wound/Skin Impairment: Handouts: Caring for Your Ulcer Methods: Explain/Verbal Responses: State content correctly Electronic Signature(s) Signed: 09/06/2022 4:16:14 PM By: Yevonne Pax RN Entered By: Yevonne Pax on 09/05/2022 15:58:33 -------------------------------------------------------------------------------- Wound Assessment Details Patient Name: Date of Service: Martin Mullen Mullen, [name] 09/05/2022 3:45 PM Medical Record Number: [id number] Patient Account Number: [id number] Date of Birth/Sex: Treating RN: 08-04-1980 (42 y.Martin. Martin Mullen Primary Care Shaniqua Guillot: Martin Mullen Other Clinician: Referring Ettamae Barkett: Treating Ezelle Surprenant/Extender: Martin Mullen, Martin Mullen, Martin Mullen in Treatment: 2 Wound Status Wound Number: 2 Primary Dehisced Wound Etiology: Wound Location: Right, Midline T  Second oe Wound Open Wounding Event: Surgical Injury Status: Date Acquired: 06/30/2022 Comorbid Coronary Artery Disease, Hypertension, Myocardial Infarction, Mullen Of Treatment: 2 History: Type II Diabetes Clustered Wound: No Pending Amputation On Presentation Photos Martin Mullen, Martin Mullen ([id number]) [id number] Page 8 of 9 Wound Measurements Length: (cm) 3.2 Width: (cm) 0.5 Depth: (cm) 3.5 Area: (cm) 1.257 Volume: (cm) 4.398 % Reduction in Area: 38.2% % Reduction in Volume: 39.9% Tunneling: No Undermining: No Wound Description Classification: Full Thickness With Exposed Suppo Exudate Amount: Medium Exudate Type: Serosanguineous Exudate Color: red, brown rt Structures Foul Odor After Cleansing: No Slough/Fibrino Yes Wound Bed Granulation Amount: Medium (34-66%) Exposed Structure Granulation Quality: Red, Pink Fascia Exposed: No Necrotic Amount: Medium (34-66%) Fat Layer (Subcutaneous Tissue) Exposed: Yes Necrotic Quality: Adherent Slough Tendon Exposed: No Muscle Exposed: No Joint Exposed: No Bone Exposed: No Treatment Notes Wound #2 (Toe Second) Wound Laterality: Right, Midline Cleanser Soap and Water Discharge Instruction: Gently cleanse wound with antibacterial soap, rinse and pat dry prior to dressing wounds Vashe 5.8 (oz) Discharge Instruction: Use vashe 5.8 (oz) as directed Peri-Wound Care Topical Primary Dressing Hydrofera Blue Ready Transfer Foam, 2.5x2.5 (in/in) Discharge Instruction: Apply Hydrofera Blue Ready to wound bed as directed Hydrofera Blue Classic Foam Rope Dressing, 9x6 (mm/in) Discharge Instruction: inserted into wound cavity Secondary Dressing ABD Pad 5x9 (in/in) Discharge Instruction: Cover with ABD pad Gauze Discharge Instruction: As directed: dry Secured With Medipore T - 46M Medipore H Soft Cloth Surgical T ape ape, 2x2 (in/yd) Compression Wrap Compression Stockings Add-Ons Electronic Signature(s) Signed:  09/06/2022 4:16:14 PM By: Yevonne Pax RN Entered By: Yevonne Pax on 09/05/2022 15:57:43 Martin Mullen, Martin Mullen ([id number]) [id number] Page 9 of 9 -------------------------------------------------------------------------------- Vitals Details Patient Name: Date of Service: Martin Mullen, [name] 09/05/2022 3:45 PM Medical Record Number: [id number] Patient Account Number: [id number] Date of Birth/Sex: Treating RN: [birth year] (42 y.Martin. Martin Mullen) Yevonne Pax Primary Care Gemma Ruan: Martin Mullen Other Clinician: Referring Cordarrel Stiefel: Treating Ezel Vallone/Extender: Martin Mullen, Martin Mullen, Martin Mullen in Treatment: 2 Vital Signs Time Taken: 15:50 Temperature (F): 97.3 Pulse (bpm): 90 Respiratory Rate (breaths/min): 18 Blood Pressure (mmHg): 110/70 Reference Range: 80 - 120 mg / dl Electronic Signature(s) Signed: 09/06/2022 4:16:14 PM By: Yevonne Pax RN Entered By: Yevonne Pax on 09/05/2022 15:51:04

## 2022-09-07 ENCOUNTER — Encounter: Payer: Self-pay | Admitting: Internal Medicine

## 2022-09-07 ENCOUNTER — Ambulatory Visit: Payer: Medicaid Other

## 2022-09-08 NOTE — Progress Notes (Signed)
ODYSSEUS, CADA ([id number]) [id number] Page 1 of 2 Visit Report for 09/02/2022 Physician Orders Details Patient Name: Date of Service: Martin Mullen, Martin TTY L. 09/02/2022 9:00 A M Medical Record Number: [id number] Patient Account Number: [id number] Date of Birth/Sex: Treating RN: January 31, 1981 (42 y.Martin. Martin Mullen [place] Primary Care Provider: [name] Other Clinician: Referring Provider: Treating Provider/Extender: Carron Curie in Treatment: 1 Verbal / Phone Orders: No Diagnosis Coding Follow-up Appointments Return Appointment in 1 week. Nurse Visit as needed - Wednesday and Friday Bathing/ [employer] wounds with antibacterial soap and water. May shower; gently cleanse wound with antibacterial soap, rinse and pat dry prior to dressing wounds No tub bath. Anesthetic (Use 'Patient Medications' Section for Anesthetic Order Entry) Lidocaine applied to wound bed Edema Control - Lymphedema / Segmental Compressive Device / Other Elevate, Exercise Daily and A void Standing for Long Periods of Time. Elevate legs to the level of the heart and pump ankles as often as possible Elevate leg(s) parallel to the floor when sitting. Off-Loading Open toe surgical shoe - Bilateral. Wound Treatment Wound #2 - T Second oe Wound Laterality: Right, Midline Cleanser: Soap and Water (Home Health) 3 x Per Week/30 Days Discharge Instructions: Gently cleanse wound with antibacterial soap, rinse and pat dry prior to dressing wounds Cleanser: Vashe 5.8 (oz) (Home Health) 3 x Per Week/30 Days Discharge Instructions: Use vashe 5.8 (oz) as directed Prim Dressing: Hydrofera Blue Ready Transfer Foam, 2.5x2.5 (in/in) (Home Health) 3 x Per Week/30 Days ary Discharge Instructions: Apply Hydrofera Blue Ready to wound bed as directed Prim Dressing: Hydrofera Blue Classic Foam Rope Dressing, 9x6 (mm/in) ary 3 x Per Week/30 Days Discharge Instructions: inserted into  wound cavity Secondary Dressing: ABD Pad 5x9 (in/in) 3 x Per Week/30 Days Discharge Instructions: Cover with ABD pad Secondary Dressing: Gauze 3 x Per Week/30 Days Discharge Instructions: As directed: dry Secured With: Medipore T - 83M Medipore H Soft Cloth Surgical T ape ape, 2x2 (in/yd) 3 x Per Week/30 Days Electronic Signature(s) Signed: 09/05/2022 9:18:25 AM By: Yevonne Pax RN Signed: 09/08/2022 4:06:59 PM By: Allen Derry PA-C Entered By: Yevonne Pax on 09/05/2022 09:18:25 Martin Mullen ([id number]) [id number] Page 2 of 2 -------------------------------------------------------------------------------- SuperBill Details Patient Name: Date of Service: Martin Mullen, Martin TTY L. 09/02/2022 Medical Record Number: [id number] Patient Account Number: [id number] Date of Birth/Sex: Treating RN: Sep 14, 1980 (42 y.Martin. 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 T81.31XA Disruption of external operation (surgical) wound, not elsewhere classified, initial encounter L97.512 Non-pressure chronic ulcer of other part of right foot with fat layer exposed I10 Essential (primary) hypertension I25.10 Atherosclerotic heart disease of native coronary artery without angina pectoris Facility Procedures : CPT4 Code: 47425956 Description: 38756 - WOUND CARE VISIT-LEV 2 EST PT Modifier: Quantity: 1 Electronic Signature(s) Signed: 09/05/2022 9:19:33 AM By: Yevonne Pax RN Signed: 09/08/2022 4:06:59 PM By: Allen Derry PA-C Entered By: Yevonne Pax on 09/05/2022 09:19:32

## 2022-09-09 NOTE — Progress Notes (Signed)
JATHNIEL, POLINSKY ([id number]) [phone].pdf Page 1 of 5 Visit Report for 09/09/2022 Arrival Information Details Patient Name: Date of Service: Martin Mullen ER, [name] 09/09/2022 12:15 PM Medical Record Number: [id number] Patient Account Number: [id number] Date of Birth/Sex: Treating RN: September 22, 1980 (42 y.o. Barnett Abu, Leah Primary Care Theo Krumholz: [name] Other Clinician: Referring Cassell Voorhies: Treating Kirtis Challis/Extender: RO BSO Dorris Carnes, MICHA EL Vinnie Level, Gwendalyn Ege in Treatment: 2 Visit Information History Since Last Visit Pain Present Now: No Patient Arrived: Ambulatory Arrival Time: 12:21 Accompanied By: self Transfer Assistance: Manual Patient Identification Verified: Yes Secondary Verification Process Completed: Yes Patient Requires Transmission-Based Precautions: No Patient Has Alerts: Yes Patient Alerts: Patient on Blood Thinner Eliquis Diabetic Type 2 Electronic Signature(s) Signed: 09/09/2022 1:14:15 PM By: Bonnell Public Entered By: Bonnell Public on 09/09/2022 12:39:12 -------------------------------------------------------------------------------- Clinic Level of Care Assessment Details Patient Name: Date of Service: Martin Mullen ER, [name] 09/09/2022 12:15 PM Medical Record Number: [id number] Patient Account Number: [id number] Date of Birth/Sex: Treating RN: 1980-12-26 (42 y.o. Barnett Abu, Leah Primary Care Keirston Saephanh: [name] Other Clinician: Referring Zeah Germano: Treating Eniya Cannady/Extender: RO BSO N, MICHA EL Vinnie Level, Regina Weeks in Treatment: 2 Clinic Level of Care Assessment Items TOOL 4 Quantity Score []  - 0 Use when only an EandM is performed on FOLLOW-UP visit ASSESSMENTS - Nursing Assessment / Reassessment X- 1 10 Reassessment of Co-morbidities (includes updates in patient status) X- 1 5 Reassessment of Adherence to Treatment Plan ASSESSMENTS - Wound and Skin A ssessment / Reassessment X - Simple Wound Assessment / Reassessment - one  wound 1 5 Fidalgo, Alon L ([id number]) [id number] Page 2 of 5 []  - 0 Complex Wound Assessment / Reassessment - multiple wounds []  - 0 Dermatologic / Skin Assessment (not related to wound area) ASSESSMENTS - Focused Assessment []  - 0 Circumferential Edema Measurements - multi extremities []  - 0 Nutritional Assessment / Counseling / Intervention []  - 0 Lower Extremity Assessment (monofilament, tuning fork, pulses) []  - 0 Peripheral Arterial Disease Assessment (using hand held doppler) ASSESSMENTS - Ostomy and/or Continence Assessment and Care []  - 0 Incontinence Assessment and Management []  - 0 Ostomy Care Assessment and Management (repouching, etc.) PROCESS - Coordination of Care []  - 0 Simple Patient / Family Education for ongoing care []  - 0 Complex (extensive) Patient / Family Education for ongoing care []  - 0 Staff obtains Chiropractor, Records, T Results / Process Orders est []  - 0 Staff telephones HHA, Nursing Homes / Clarify orders / etc []  - 0 Routine Transfer to another Facility (non-emergent condition) []  - 0 Routine Hospital Admission (non-emergent condition) []  - 0 New Admissions / Manufacturing engineer / Ordering NPWT Apligraf, etc. , []  - 0 Emergency Hospital Admission (emergent condition) X- 1 10 Simple Discharge Coordination []  - 0 Complex (extensive) Discharge Coordination PROCESS - Special Needs []  - 0 Pediatric / Minor Patient Management []  - 0 Isolation Patient Management []  - 0 Hearing / Language / Visual special needs []  - 0 Assessment of Community assistance (transportation, D/C planning, etc.) []  - 0 Additional assistance / Altered mentation []  - 0 Support Surface(s) Assessment (bed, cushion, seat, etc.) INTERVENTIONS - Wound Cleansing / Measurement X - Simple Wound Cleansing - one wound 1 5 []  - 0 Complex Wound Cleansing - multiple wounds []  - 0 Wound Imaging (photographs - any number of wounds) []  -  0 Wound Tracing (instead of photographs) []  - 0 Simple Wound Measurement - one wound []  - 0 Complex Wound Measurement - multiple wounds INTERVENTIONS -  Wound Dressings X - Small Wound Dressing one or multiple wounds 1 10 []  - 0 Medium Wound Dressing one or multiple wounds []  - 0 Large Wound Dressing one or multiple wounds []  - 0 Application of Medications - topical []  - 0 Application of Medications - injection INTERVENTIONS - Miscellaneous []  - 0 External ear exam []  - 0 Specimen Collection (cultures, biopsies, blood, body fluids, etc.) KUZEY, BERHANE ([id number]) [id number] Page 3 of 5 []  - 0 Specimen(s) / Culture(s) sent or taken to Lab for analysis []  - 0 Patient Transfer (multiple staff / Michiel Sites Lift / Similar devices) []  - 0 Simple Staple / Suture removal (25 or less) []  - 0 Complex Staple / Suture removal (26 or more) []  - 0 Hypo / Hyperglycemic Management (close monitor of Blood Glucose) []  - 0 Ankle / Brachial Index (ABI) - do not check if billed separately []  - 0 Vital Signs Has the patient been seen at the hospital within the last three years: Yes Total Score: 45 Level Of Care: New/Established - Level 2 Electronic Signature(s) Signed: 09/09/2022 1:14:15 PM By: Bonnell Public Entered By: Bonnell Public on 09/09/2022 12:40:22 -------------------------------------------------------------------------------- Encounter Discharge Information Details Patient Name: Date of Service: Martin Mullen ER, [name] 09/09/2022 12:15 PM Medical Record Number: [id number] Patient Account Number: [id number] Date of Birth/Sex: Treating RN: 1980-03-28 (42 y.o. Barnett Abu, Leah Primary Care Terrianna Holsclaw: [name] Other Clinician: Referring Ketsia Linebaugh: Treating Juana Haralson/Extender: RO BSO N, MICHA EL Vinnie Level, Regina Weeks in Treatment: 2 Encounter Discharge Information Items Discharge Condition: Stable Ambulatory Status: Ambulatory Discharge Destination:  Home Schedule Follow-up Appointment: No Clinical Summary of Care: Notes patient presented with wet dressing from "walking in grass", wound margin macerated and unclear if from wound drainage vs wet dressing; patient given dry sock, shoe cover and encouraged to avoid wet/damp surfaces if possible to keep clean and dry dressing Electronic Signature(s) Signed: 09/09/2022 1:14:15 PM By: Bonnell Public Entered By: Bonnell Public on 09/09/2022 12:42:14 -------------------------------------------------------------------------------- Wound Assessment Details Patient Name: Date of Service: Martin Mullen ER, [name] 09/09/2022 12:15 PM Mayo Ao ([id number]) [id number] Page 4 of 5 Medical Record Number: [id number] Patient Account Number: [id number] Date of Birth/Sex: Treating RN: 15-Aug-1980 (42 y.o. Barnett Abu, Leah Primary Care Darrel Gloss: [name] Other Clinician: Referring Sola Margolis: Treating Kalvin Buss/Extender: RO BSO N, MICHA EL Vinnie Level, Regina Weeks in Treatment: 2 Wound Status Wound Number: 2 Primary Dehisced Wound Etiology: Wound Location: Right, Midline T Second oe Wound Open Wounding Event: Surgical Injury Status: Date Acquired: 06/30/2022 Comorbid Coronary Artery Disease, Hypertension, Myocardial Weeks Of Treatment: 2 History: Infarction, Type II Diabetes Clustered Wound: No Pending Amputation On Presentation Wound Measurements Length: (cm) 3.2 Width: (cm) 0.5 Depth: (cm) 3.5 Area: (cm) 1.257 Volume: (cm) 4.398 % Reduction in Area: 38.2% % Reduction in Volume: 39.9% Epithelialization: None Tunneling: No Undermining: No Wound Description Classification: Full Thickness With Exposed Suppo Exudate Amount: Medium Exudate Type: Serosanguineous Exudate Color: red, brown rt Structures Foul Odor After Cleansing: No Slough/Fibrino Yes Wound Bed Granulation Amount: Medium (34-66%) Exposed Structure Granulation Quality: Red, Pink Fascia Exposed:  No Necrotic Amount: Medium (34-66%) Fat Layer (Subcutaneous Tissue) Exposed: Yes Necrotic Quality: Adherent Slough Tendon Exposed: No Muscle Exposed: No Joint Exposed: No Bone Exposed: No Treatment Notes Wound #2 (Toe Second) Wound Laterality: Right, Midline Cleanser Soap and Water Discharge Instruction: Gently cleanse wound with antibacterial soap, rinse and pat dry prior to dressing wounds Vashe 5.8 (oz) Discharge Instruction: Use vashe 5.8 (oz) as directed Peri-Wound  Care Topical Primary Dressing Hydrofera Blue Ready Transfer Foam, 2.5x2.5 (in/in) Discharge Instruction: Apply Hydrofera Blue Ready to wound bed as directed Hydrofera Blue Classic Foam Rope Dressing, 9x6 (mm/in) Discharge Instruction: inserted into wound cavity Secondary Dressing ABD Pad 5x9 (in/in) Discharge Instruction: Cover with ABD pad Gauze Discharge Instruction: As directed: dry Secured With Medipore T - 79M Medipore H Soft Cloth Surgical T ape ape, 2x2 (in/yd) Compression Wrap Compression Stockings Add-Ons Electronic Signature(s) Signed: 09/09/2022 1:14:15 PM By: Tenna Child, Ihor Austin ([id number]) PM By: Bonnell Public [id number].pdf Page 5 of 5 Signed: 09/09/2022 1:14:15 Entered By: Bonnell Public on 09/09/2022 12:39:44

## 2022-09-12 ENCOUNTER — Encounter: Payer: Medicaid Other | Admitting: Physician Assistant

## 2022-09-12 ENCOUNTER — Inpatient Hospital Stay
Admission: EM | Admit: 2022-09-12 | Discharge: 2022-09-28 | DRG: 270 | Disposition: A | Discharge status: Skilled Nursing Facility | Payer: Medicaid Other | Source: Ambulatory Visit | Attending: Student in an Organized Health Care Education/Training Program | Admitting: Student in an Organized Health Care Education/Training Program

## 2022-09-12 ENCOUNTER — Other Ambulatory Visit: Payer: Self-pay

## 2022-09-12 ENCOUNTER — Emergency Department: Payer: Medicaid Other

## 2022-09-12 DIAGNOSIS — Z955 Presence of coronary angioplasty implant and graft: Secondary | ICD-10-CM

## 2022-09-12 DIAGNOSIS — Z8614 Personal history of Methicillin resistant Staphylococcus aureus infection: Secondary | ICD-10-CM

## 2022-09-12 DIAGNOSIS — Z7982 Long term (current) use of aspirin: Secondary | ICD-10-CM | POA: Diagnosis not present

## 2022-09-12 DIAGNOSIS — E1142 Type 2 diabetes mellitus with diabetic polyneuropathy: Secondary | ICD-10-CM | POA: Diagnosis present

## 2022-09-12 DIAGNOSIS — T8743 Infection of amputation stump, right lower extremity: Secondary | ICD-10-CM | POA: Diagnosis not present

## 2022-09-12 DIAGNOSIS — Z89611 Acquired absence of right leg above knee: Secondary | ICD-10-CM | POA: Diagnosis not present

## 2022-09-12 DIAGNOSIS — Z9861 Coronary angioplasty status: Secondary | ICD-10-CM

## 2022-09-12 DIAGNOSIS — G9341 Metabolic encephalopathy: Secondary | ICD-10-CM | POA: Diagnosis not present

## 2022-09-12 DIAGNOSIS — Z89421 Acquired absence of other right toe(s): Secondary | ICD-10-CM

## 2022-09-12 DIAGNOSIS — Z794 Long term (current) use of insulin: Secondary | ICD-10-CM

## 2022-09-12 DIAGNOSIS — Z79899 Other long term (current) drug therapy: Secondary | ICD-10-CM

## 2022-09-12 DIAGNOSIS — E11621 Type 2 diabetes mellitus with foot ulcer: Secondary | ICD-10-CM | POA: Diagnosis present

## 2022-09-12 DIAGNOSIS — I2510 Atherosclerotic heart disease of native coronary artery without angina pectoris: Secondary | ICD-10-CM | POA: Diagnosis present

## 2022-09-12 DIAGNOSIS — I700 Atherosclerosis of aorta: Secondary | ICD-10-CM | POA: Diagnosis not present

## 2022-09-12 DIAGNOSIS — I70261 Atherosclerosis of native arteries of extremities with gangrene, right leg: Secondary | ICD-10-CM | POA: Diagnosis present

## 2022-09-12 DIAGNOSIS — Z7984 Long term (current) use of oral hypoglycemic drugs: Secondary | ICD-10-CM | POA: Diagnosis not present

## 2022-09-12 DIAGNOSIS — A419 Sepsis, unspecified organism: Secondary | ICD-10-CM | POA: Diagnosis not present

## 2022-09-12 DIAGNOSIS — Z95820 Peripheral vascular angioplasty status with implants and grafts: Secondary | ICD-10-CM

## 2022-09-12 DIAGNOSIS — Y831 Surgical operation with implant of artificial internal device as the cause of abnormal reaction of the patient, or of later complication, without mention of misadventure at the time of the procedure: Secondary | ICD-10-CM | POA: Diagnosis present

## 2022-09-12 DIAGNOSIS — L97519 Non-pressure chronic ulcer of other part of right foot with unspecified severity: Secondary | ICD-10-CM | POA: Diagnosis present

## 2022-09-12 DIAGNOSIS — I10 Essential (primary) hypertension: Secondary | ICD-10-CM | POA: Diagnosis present

## 2022-09-12 DIAGNOSIS — Z83438 Family history of other disorder of lipoprotein metabolism and other lipidemia: Secondary | ICD-10-CM

## 2022-09-12 DIAGNOSIS — I96 Gangrene, not elsewhere classified: Secondary | ICD-10-CM

## 2022-09-12 DIAGNOSIS — E1165 Type 2 diabetes mellitus with hyperglycemia: Secondary | ICD-10-CM | POA: Diagnosis not present

## 2022-09-12 DIAGNOSIS — Z833 Family history of diabetes mellitus: Secondary | ICD-10-CM

## 2022-09-12 DIAGNOSIS — I7090 Unspecified atherosclerosis: Principal | ICD-10-CM

## 2022-09-12 DIAGNOSIS — I743 Embolism and thrombosis of arteries of the lower extremities: Secondary | ICD-10-CM | POA: Diagnosis not present

## 2022-09-12 DIAGNOSIS — Z91128 Patient's intentional underdosing of medication regimen for other reason: Secondary | ICD-10-CM | POA: Diagnosis not present

## 2022-09-12 DIAGNOSIS — T82898A Other specified complication of vascular prosthetic devices, implants and grafts, initial encounter: Secondary | ICD-10-CM | POA: Diagnosis not present

## 2022-09-12 DIAGNOSIS — E1152 Type 2 diabetes mellitus with diabetic peripheral angiopathy with gangrene: Secondary | ICD-10-CM | POA: Diagnosis present

## 2022-09-12 DIAGNOSIS — Z8261 Family history of arthritis: Secondary | ICD-10-CM

## 2022-09-12 DIAGNOSIS — T82518A Breakdown (mechanical) of other cardiac and vascular devices and implants, initial encounter: Secondary | ICD-10-CM | POA: Diagnosis present

## 2022-09-12 DIAGNOSIS — I252 Old myocardial infarction: Secondary | ICD-10-CM

## 2022-09-12 DIAGNOSIS — E1151 Type 2 diabetes mellitus with diabetic peripheral angiopathy without gangrene: Secondary | ICD-10-CM | POA: Diagnosis not present

## 2022-09-12 DIAGNOSIS — Z7901 Long term (current) use of anticoagulants: Secondary | ICD-10-CM | POA: Diagnosis not present

## 2022-09-12 DIAGNOSIS — E7800 Pure hypercholesterolemia, unspecified: Secondary | ICD-10-CM | POA: Diagnosis present

## 2022-09-12 DIAGNOSIS — I69328 Other speech and language deficits following cerebral infarction: Secondary | ICD-10-CM

## 2022-09-12 DIAGNOSIS — Z751 Person awaiting admission to adequate facility elsewhere: Secondary | ICD-10-CM

## 2022-09-12 DIAGNOSIS — Z820 Family history of epilepsy and other diseases of the nervous system: Secondary | ICD-10-CM

## 2022-09-12 DIAGNOSIS — Z7189 Other specified counseling: Secondary | ICD-10-CM | POA: Diagnosis not present

## 2022-09-12 DIAGNOSIS — Z91148 Patient's other noncompliance with medication regimen for other reason: Secondary | ICD-10-CM

## 2022-09-12 DIAGNOSIS — K219 Gastro-esophageal reflux disease without esophagitis: Secondary | ICD-10-CM | POA: Diagnosis present

## 2022-09-12 DIAGNOSIS — I70221 Atherosclerosis of native arteries of extremities with rest pain, right leg: Secondary | ICD-10-CM | POA: Diagnosis not present

## 2022-09-12 DIAGNOSIS — I998 Other disorder of circulatory system: Secondary | ICD-10-CM | POA: Diagnosis present

## 2022-09-12 DIAGNOSIS — Z515 Encounter for palliative care: Secondary | ICD-10-CM | POA: Diagnosis not present

## 2022-09-12 DIAGNOSIS — T45516A Underdosing of anticoagulants, initial encounter: Secondary | ICD-10-CM | POA: Diagnosis present

## 2022-09-12 DIAGNOSIS — Z9889 Other specified postprocedural states: Secondary | ICD-10-CM | POA: Diagnosis not present

## 2022-09-12 DIAGNOSIS — R230 Cyanosis: Secondary | ICD-10-CM | POA: Diagnosis present

## 2022-09-12 DIAGNOSIS — Z95828 Presence of other vascular implants and grafts: Secondary | ICD-10-CM | POA: Diagnosis not present

## 2022-09-12 DIAGNOSIS — Y92009 Unspecified place in unspecified non-institutional (private) residence as the place of occurrence of the external cause: Secondary | ICD-10-CM

## 2022-09-12 DIAGNOSIS — D6852 Prothrombin gene mutation: Secondary | ICD-10-CM | POA: Diagnosis present

## 2022-09-12 DIAGNOSIS — F17210 Nicotine dependence, cigarettes, uncomplicated: Secondary | ICD-10-CM | POA: Diagnosis present

## 2022-09-12 DIAGNOSIS — Z89422 Acquired absence of other left toe(s): Secondary | ICD-10-CM | POA: Diagnosis not present

## 2022-09-12 DIAGNOSIS — I639 Cerebral infarction, unspecified: Secondary | ICD-10-CM | POA: Diagnosis present

## 2022-09-12 DIAGNOSIS — T82868A Thrombosis of vascular prosthetic devices, implants and grafts, initial encounter: Principal | ICD-10-CM | POA: Diagnosis present

## 2022-09-12 DIAGNOSIS — Z7985 Long-term (current) use of injectable non-insulin antidiabetic drugs: Secondary | ICD-10-CM

## 2022-09-12 LAB — COMPREHENSIVE METABOLIC PANEL
ALT: 10 U/L (ref 0–44)
AST: 11 U/L — ABNORMAL LOW (ref 15–41)
Albumin: 4 g/dL (ref 3.5–5.0)
Alkaline Phosphatase: 128 U/L — ABNORMAL HIGH (ref 38–126)
Anion gap: 10 (ref 5–15)
BUN: 10 mg/dL (ref 6–20)
CO2: 27 mmol/L (ref 22–32)
Calcium: 9 mg/dL (ref 8.9–10.3)
Chloride: 97 mmol/L — ABNORMAL LOW (ref 98–111)
Creatinine, Ser: 1.01 mg/dL (ref 0.61–1.24)
GFR, Estimated: >60 mL/min (ref >60)
Glucose, Bld: 288 mg/dL — ABNORMAL HIGH (ref 70–99)
Potassium: 3.9 mmol/L (ref 3.5–5.1)
Sodium: 134 mmol/L — ABNORMAL LOW (ref 135–145)
Total Bilirubin: 0.4 mg/dL (ref 0.3–1.2)
Total Protein: 7.8 g/dL (ref 6.5–8.1)

## 2022-09-12 LAB — CBC
HCT: 49.2 % (ref 39.0–52.0)
Hemoglobin: 17.1 g/dL — ABNORMAL HIGH (ref 13.0–17.0)
MCH: 31.4 pg (ref 26.0–34.0)
MCHC: 34.8 g/dL (ref 30.0–36.0)
MCV: 90.3 fL (ref 80.0–100.0)
Platelets: 238 10*3/uL (ref 150–400)
RBC: 5.45 MIL/uL (ref 4.22–5.81)
RDW: 14.1 % (ref 11.5–15.5)
WBC: 15.5 10*3/uL — ABNORMAL HIGH (ref 4.0–10.5)
nRBC: 0 % (ref 0.0–0.2)

## 2022-09-12 LAB — HEPARIN LEVEL (UNFRACTIONATED): Heparin Unfractionated: 0.12 IU/mL — ABNORMAL LOW (ref 0.30–0.70)

## 2022-09-12 LAB — GLUCOSE, CAPILLARY: Glucose-Capillary: 215 mg/dL — ABNORMAL HIGH (ref 70–99)

## 2022-09-12 MED ORDER — SODIUM CHLORIDE 0.9% FLUSH
3.0000 mL | Freq: Two times a day (BID) | INTRAVENOUS | Status: DC
  Administered 2022-09-13 – 2022-09-14 (×3): 3 mL via INTRAVENOUS

## 2022-09-12 MED ORDER — ROSUVASTATIN CALCIUM 10 MG PO TABS
20.0000 mg | ORAL_TABLET | Freq: Every day | ORAL | Status: DC
  Administered 2022-09-13 – 2022-09-14 (×2): 20 mg via ORAL
  Filled 2022-09-12 (×2): qty 2

## 2022-09-12 MED ORDER — ACETAMINOPHEN 325 MG PO TABS: 650.0000 mg | ORAL_TABLET | Freq: Four times a day (QID) | ORAL | Status: DC | PRN

## 2022-09-12 MED ORDER — ASPIRIN 81 MG PO TBEC
81.0000 mg | DELAYED_RELEASE_TABLET | Freq: Every day | ORAL | Status: DC
  Administered 2022-09-13 – 2022-09-14 (×2): 81 mg via ORAL
  Filled 2022-09-12 (×2): qty 1

## 2022-09-12 MED ORDER — SODIUM CHLORIDE 0.9 % IV BOLUS
1000.0000 mL | Freq: Once | INTRAVENOUS | Status: AC
  Administered 2022-09-12: 1000 mL via INTRAVENOUS

## 2022-09-12 MED ORDER — OXYCODONE HCL 5 MG PO TABS: 5.0000 mg | ORAL_TABLET | Freq: Four times a day (QID) | ORAL | Status: DC | PRN

## 2022-09-12 MED ORDER — INSULIN ASPART 100 UNIT/ML IJ SOLN
0.0000 [IU] | Freq: Three times a day (TID) | INTRAMUSCULAR | Status: DC
  Administered 2022-09-12: 5 [IU] via SUBCUTANEOUS
  Administered 2022-09-14: 3 [IU] via SUBCUTANEOUS
  Administered 2022-09-15: 5 [IU] via SUBCUTANEOUS
  Administered 2022-09-15 – 2022-09-16 (×2): 3 [IU] via SUBCUTANEOUS
  Administered 2022-09-16 – 2022-09-17 (×3): 2 [IU] via SUBCUTANEOUS
  Administered 2022-09-17 – 2022-09-18 (×3): 5 [IU] via SUBCUTANEOUS
  Administered 2022-09-18: 3 [IU] via SUBCUTANEOUS
  Administered 2022-09-19: 5 [IU] via SUBCUTANEOUS
  Administered 2022-09-19: 3 [IU] via SUBCUTANEOUS
  Administered 2022-09-19: 8 [IU] via SUBCUTANEOUS
  Administered 2022-09-20: 3 [IU] via SUBCUTANEOUS
  Administered 2022-09-20: 5 [IU] via SUBCUTANEOUS
  Administered 2022-09-21: 3 [IU] via SUBCUTANEOUS
  Administered 2022-09-21: 2 [IU] via SUBCUTANEOUS
  Administered 2022-09-21 – 2022-09-22 (×2): 5 [IU] via SUBCUTANEOUS
  Administered 2022-09-22: 3 [IU] via SUBCUTANEOUS
  Administered 2022-09-22: 5 [IU] via SUBCUTANEOUS
  Administered 2022-09-23: 8 [IU] via SUBCUTANEOUS
  Administered 2022-09-23 – 2022-09-24 (×3): 3 [IU] via SUBCUTANEOUS
  Administered 2022-09-24: 11 [IU] via SUBCUTANEOUS
  Administered 2022-09-25: 8 [IU] via SUBCUTANEOUS
  Administered 2022-09-25 – 2022-09-26 (×2): 3 [IU] via SUBCUTANEOUS
  Administered 2022-09-26: 5 [IU] via SUBCUTANEOUS
  Administered 2022-09-26: 8 [IU] via SUBCUTANEOUS
  Administered 2022-09-27: 3 [IU] via SUBCUTANEOUS
  Administered 2022-09-27: 5 [IU] via SUBCUTANEOUS
  Administered 2022-09-27 – 2022-09-28 (×2): 3 [IU] via SUBCUTANEOUS
  Administered 2022-09-28: 11 [IU] via SUBCUTANEOUS
  Filled 2022-09-12 (×36): qty 1

## 2022-09-12 MED ORDER — ONDANSETRON HCL 4 MG PO TABS: 4.0000 mg | ORAL_TABLET | Freq: Four times a day (QID) | ORAL | Status: DC | PRN

## 2022-09-12 MED ORDER — ACETAMINOPHEN 650 MG RE SUPP: 650.0000 mg | Freq: Four times a day (QID) | RECTAL | Status: DC | PRN

## 2022-09-12 MED ORDER — HEPARIN (PORCINE) 25000 UT/250ML-% IV SOLN
1550.0000 [IU]/h | INTRAVENOUS | Status: DC
  Administered 2022-09-12: 1400 [IU]/h via INTRAVENOUS
  Administered 2022-09-13: 1550 [IU]/h via INTRAVENOUS
  Filled 2022-09-12 (×2): qty 250

## 2022-09-12 MED ORDER — INSULIN GLARGINE-YFGN 100 UNIT/ML ~~LOC~~ SOLN
8.0000 [IU] | Freq: Every day | SUBCUTANEOUS | Status: DC
  Administered 2022-09-12 – 2022-09-13 (×2): 8 [IU] via SUBCUTANEOUS
  Filled 2022-09-12 (×3): qty 0.08

## 2022-09-12 MED ORDER — ONDANSETRON HCL 4 MG/2ML IJ SOLN: 4.0000 mg | Freq: Four times a day (QID) | INTRAMUSCULAR | Status: DC | PRN

## 2022-09-12 MED ORDER — LOSARTAN POTASSIUM 25 MG PO TABS
25.0000 mg | ORAL_TABLET | Freq: Every day | ORAL | Status: DC
  Administered 2022-09-13 – 2022-09-14 (×2): 25 mg via ORAL
  Filled 2022-09-12 (×2): qty 1

## 2022-09-12 MED ORDER — HEPARIN BOLUS VIA INFUSION
5100.0000 [IU] | Freq: Once | INTRAVENOUS | Status: AC
  Administered 2022-09-12: 5100 [IU] via INTRAVENOUS
  Filled 2022-09-12: qty 5100

## 2022-09-12 NOTE — Assessment & Plan Note (Addendum)
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  He has been on chronic Eliquis, but self discontinued 3 months ago.  He is uncertain why he discontinued it but after discussion, he is agreeable to taking it without interruption.  - Heparin infusion per pharmacy dosing given limb ischemia as noted above - Restart Eliquis prior to discharge

## 2022-09-12 NOTE — ED Notes (Signed)
Assumed care of pt at this time. Pt is AAXO4, on CCM, VS stable and WNL. No needs identified at this time.

## 2022-09-12 NOTE — Progress Notes (Addendum)
ELVON, MCCULLOH ([id number]) [id number] Page 1 of 8 Visit Report for 09/12/2022 Chief Complaint Document Details Patient Name: Date of Service: Martin Mullen ER, [name] 09/12/2022 3:15 PM Medical Record Number: [id number] Patient Account Number: [id number] Date of Birth/Sex: Treating RN: 1980/07/05 (42 y.o. Judie Petit) Yevonne Pax Primary Care Provider: [name] Other Clinician: Referring Provider: Treating Provider/Extender: Carron Curie in Treatment: 3 Information Obtained from: Patient Chief Complaint Bilateral foot ulcers Electronic Signature(s) Signed: 09/12/2022 3:04:27 PM By: Allen Derry PA-C Entered By: Allen Derry on 09/12/2022 15:04:27 -------------------------------------------------------------------------------- HPI Details Patient Name: Date of Service: Martin Mullen ER, [name] 09/12/2022 3:15 PM Medical Record Number: [id number] Patient Account Number: [id number] Date of Birth/Sex: Treating RN: March 27, 1980 (42 y.o. Martin Mullen Primary Care Provider: [name] Other Clinician: Referring Provider: Treating Provider/Extender: Carron Curie in Treatment: 3 History of Present Illness HPI Description: 10/11/18 patient presents today for initial evaluation our clinic. He is being seen due to a left great toe ulcer which unfortunately has been present for quite some time. Initially he thought it really wasn't much of anything and then subsequently noted that he had some drainage then that seem to dry off and he didn't worry too much about it until more recently when he is had issues. 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 Subsequently he tells me he has not utilized any recreational drugs for two years. This is obviously excellent news. He also does have diabetes type II. It does sound like he is trying to  improve things as far as his overall health status is concerned although his hemoglobin A-1 C as measured on 09/27/18 was 10.7. He did have an x-ray as well on 09/28/18 and this was negative for any evidence of acute or chronic osteomyelitis or fracture. Patient's wound is not appear to be severely infected at this time which is good news. With that being said he also doesn't really have any pain this is secondary to neuropathy. No fevers, chills, nausea, or vomiting noted at this time. 10/18/2018 on evaluation today patient actually appears to be doing better with regard to his toe ulcer. He has been tolerating the dressing changes without complication. Fortunately there is no signs of active infection at this time. Overall very pleased in this regard and how things are doing. Patient likewise is pleased and happy to see that things are doing much better. Still he does have some ways to go to get this area to completely close. 10/30/2018 on evaluation today patient's ulcer on his plantar foot actually appears to be doing quite well at this time. He has been tolerating the dressing changes without complication which is excellent news. With that being said I do believe he is making progress there is some callus buildup on without the clear away today but other than that I am seeing evidence that this seems to be filling in from the base and is getting much smaller in the base. Still were not at the point of complete closure yet. Martin Mullen, Martin Mullen ([id number]) [id number] Page 2 of 8 11/09/2018 on evaluation today patient actually appears to be doing quite well with regard to his toe ulcer from the standpoint of infection. I do not see any signs of active infection at this time which is good news. With that being said he unfortunately is not really making  much progress either he continues to have depth of the wound despite the fact that it seems to be callused over initially on  inspection when I probed and actually remove some of the callus it is much deeper. Fortunately there is no signs of active infection at this time. 11/16/2018 on evaluation today patient's toe ulcer actually appears to be doing about the same. There is really no significant improvement compared to last time I saw him. There is some debridement necessary today he has callus covering the wound area I think he may be a good candidate for total contact cast to rather rapidly get this area to close. We previously have discussed this although we have not initiated it yet I think it may be time to do so and go ahead and get this closed. 11/22/2018 on evaluation today patient appears to be doing better with regard to the overall size of his wound. He has been tolerating the dressing changes without complication. Fortunately there is no signs of active infection at this time. No fevers, chills, nausea, vomiting, or diarrhea. 11/29/2018 on evaluation today patient appears to be doing about the same really with regard to his toe ulcer. He is not showing any signs of dramatic improvement unfortunately. There is no signs of infection at this time. No fevers, chills, nausea, vomiting, or diarrhea. I really think we need to consider the total contact cast at this point in order to go ahead and get this closed. 12/04/2018 on evaluation today patient appears to be doing about the same with regard to his great toe ulcer. He is here today for the initiation of the total contact cast. Unfortunately he notes he did clip his toenail somewhat short on the second toe of the same foot he does have a slight paronychia noted at this point. There is some erythema is not spreading into the foot if just in the distal portion of the toe there is a little bit of evidence of fluid/pus buildup at the toenail margin. Nonetheless this is something that I definitely think we probably should treat just to make sure nothing gets worse while we  apply the cast he still wants to apply the cast today we will be seeing him back in 2 days anyway to reapply the cast so I do not see any problems in that regard. 12/06/2018 on evaluation today patient appears to be measuring smaller with regard to his wound. The total contact cast just in 2 days seems to have already helped he is here for repeat application today. Fortunately there is no signs of active infection at this time. No fevers, chills, nausea, vomiting, or diarrhea. He did unfortunately get the cast wet this morning taking a shower he attempted to use a trash bag and it failed. I believe he should get a cast protector which would be better as far as helping to avoid this from happening again. 10/29; the patient had a raised surface eschar over the wound area. Gently debrided with a #5 curette. The vast majority of this had closed however he had 1 small open area that still had perhaps 2 mm of depth. With considerable difficulty I was able to convince him to put the cast on for another few days. The option would have been a Pegasys shoe. 12/18/2018 upon evaluation today patient appears to be doing very well with regard to his wound on the toe. In fact this appears to be completely healed which is great news. There is no evidence  of active infection at this time which is also good news. No fevers, chills, nausea, vomiting, or diarrhea. Overall the patient is very pleased with how things seem to have progressed. Readmission: 08-22-2022 this is a gentleman whom I have seen previously. In fact it was last in November 2020 that I saw him. Since that time apparently there is quite a bit that has occurred in fact he has had 2 strokes as well as before I knew he had had a heart attack but unfortunately the strokes have altered his capacity as far as both the mental side is concerned with speech as well as even remembering things and it also affected his motor function especially in his hands. His walk  is also not as good as it was. Nonetheless he is seen today as a referral from Dr. Allena Katz at [facility]. Dr. Allena Katz did do surgery on this gentleman after he had revascularization and May on the 15th of 2024. Subsequently after the revascularization and stent placement by Dr. Wyn Quaker Dr. 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 Unfortunately it did not remain closed and dehisced presumably due to the fact that the patient states he was walking on it. He really does not have any evidence at this point of systemic infection which is good news but unfortunately the wound is doing quite poorly. He also has a big area of callus on the left foot this can require debridement today there may be a wound under this as well but appear. 08-29-23 upon evaluation today patient unfortunately appears to be doing not so well in regard to his foot ulcer. Since I last saw him he has not had the dressing changed even 1 time. He had matted hair which apparently is Here in the wound bed which required quite a bit of cleaning to clear out by the nurse prior to my coming in. With that being said I think that if he is not careful he is at high risk for losing his leg if this becomes subsequently infected. I discussed that with him today. Unfortunately he had home health but since we sent him orders for wound care they discharged him stating "they did not have any staff". Again this is something that to be peripherally honest we run into many times with patients with Medicaid or insurance carriers I do not pay well for home health services that we will be told they do not have "staph" when really my assumption is they do not want to take on the patient as they will accept other patients with Medicare otherwise. There is only a small number of Medicaid patients that it will help company will take on. 7/22;. 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 This has considerable depth. We have been using Hydrofera Blue packing rope and he is coming in once a week to see the provider and having to nurse visits. He is apparently not eligible for home health and Medicaid is the primary insurance. 09-12-2022 upon evaluation today patient unfortunately appears to be doing significantly worse in regard to his wound. This in fact looks very dusky at the periwound location he also has a cyanosis noted of the wound bed this is very different from even when I saw him 2 weeks ago. His capillary refill in the toe was easily between 15 and 20 seconds significantly elevated  compared to what it should be. I think he needs to go to the ER right now in order to have this checked out I think he is at risk of losing his foot if he does not he also states that he is having pain in the calf "like I did when I had to have my toe amputated". Electronic Signature(s) Signed: 09/12/2022 3:49:41 PM By: Allen Derry PA-C Entered By: Allen Derry on 09/12/2022 15:49:41 -------------------------------------------------------------------------------- Physical Exam Details Patient Name: Date of Service: Martin Mullen ER, [name] 09/12/2022 3:15 PM Martin Mullen, Martin Mullen ([id number]) [id number] Page 3 of 8 Medical Record Number: [id number] Patient Account Number: [id number] Date of Birth/Sex: Treating RN: 04/14/1980 (42 y.o. Martin Mullen Primary Care Provider: [name] Other Clinician: Referring Provider: Treating Provider/Extender: Carron Curie in Treatment: 3 Constitutional Well-nourished and well-hydrated in no acute distress. Respiratory normal breathing without difficulty. Psychiatric this patient is able to make decisions and demonstrates good insight into disease process. Alert and Oriented x 3. pleasant and cooperative. Notes Upon inspection patient's wound bed again  showed signs of being cyanotic in my opinion he has capillary refill that is somewhere between 15 to 20 seconds that is definitely not good. I think that he needs to go as quickly as possible to the ER for evaluation specifically I think he needs vascular evaluation to see if anything is clotted off of the previous work that was done on his vasculature in the lower extremity. I feel that something may have clotted off and he may need more urgent intervention in order to keep from losing his foot. Electronic Signature(s) Signed: 09/12/2022 3:50:17 PM By: Allen Derry PA-C Entered By: Allen Derry on 09/12/2022 15:50:17 -------------------------------------------------------------------------------- Physician Orders Details Patient Name: Date of Service: Martin Mullen ER, [name] 09/12/2022 3:15 PM Medical Record Number: [id number] Patient Account Number: [id number] Date of Birth/Sex: Treating RN: [birth year] (42 y.o. Martin Mullen Primary Care Provider: [name] Other Clinician: Referring Provider: Treating Provider/Extender: Carron Curie in Treatment: 3 Verbal / Phone Orders: No Diagnosis Coding ICD-10 Coding Code Description E11.621 Type 2 diabetes mellitus with foot ulcer L97.522 Non-pressure chronic ulcer of other part of left foot with fat layer exposed T81.31XA Disruption of external operation (surgical) wound, not elsewhere classified, initial encounter L97.512 Non-pressure chronic ulcer of other part of right foot with fat layer exposed I10 Essential (primary) hypertension I25.10 Atherosclerotic heart disease of native coronary artery without angina pectoris Follow-up Appointments Return Appointment in 1 week. Nurse Visit as needed - Wednesday and Friday Bathing/ [employer] wounds with antibacterial soap and water. May shower; gently cleanse wound with antibacterial soap, rinse and pat dry prior to dressing wounds No tub bath. Anesthetic (Use  'Patient Medications' Section for Anesthetic Order Entry) Lidocaine applied to wound bed Edema Control - Lymphedema / Segmental Compressive Device / Other Elevate, Exercise Daily and Avoid Standing for Long Periods of Time. Martin Mullen, Martin Mullen ([id number]) [id number] Page 4 of 8 Elevate legs to the level of the heart and pump ankles as often as possible Elevate leg(s) parallel to the floor when sitting. Off-Loading Open toe surgical shoe - Bilateral. Wound Treatment Wound #2 - T Second oe Wound Laterality: Right, Midline Cleanser: Soap and Water (Home Health) 3 x Per Week/30 Days Discharge Instructions: Gently cleanse wound with antibacterial soap, rinse and pat dry prior to dressing wounds Cleanser: Vashe 5.8 (oz) (Home Health) 3 x Per Week/30 Days Discharge Instructions: Use vashe 5.8 (oz)  as directed Prim Dressing: Hydrofera Blue Ready Transfer Foam, 2.5x2.5 (in/in) (Home Health) 3 x Per Week/30 Days ary Discharge Instructions: Apply Hydrofera Blue Ready to wound bed as directed Prim Dressing: Hydrofera Blue Classic Foam Rope Dressing, 9x6 (mm/in) ary 3 x Per Week/30 Days Discharge Instructions: inserted into wound cavity Secondary Dressing: ABD Pad 5x9 (in/in) 3 x Per Week/30 Days Discharge Instructions: Cover with ABD pad Secondary Dressing: Gauze 3 x Per Week/30 Days Discharge Instructions: As directed: dry Secured With: Medipore T - 37M Medipore H Soft Cloth Surgical T ape ape, 2x2 (in/yd) 3 x Per Week/30 Days Electronic Signature(s) Signed: 09/12/2022 4:10:15 PM By: Yevonne Pax RN Signed: 09/12/2022 5:01:36 PM By: Allen Derry PA-C Entered By: Yevonne Pax on 09/12/2022 16:10:15 -------------------------------------------------------------------------------- Problem List Details Patient Name: Date of Service: Martin Mullen ER, [name] 09/12/2022 3:15 PM Medical Record Number: [id number] Patient Account Number: [id number] Date of Birth/Sex: Treating  RN: [birth year] (42 y.o. Martin Mullen Primary Care Provider: [name] Other Clinician: Referring Provider: Treating Provider/Extender: Carron Curie in Treatment: 3 Active Problems ICD-10 Encounter Code Description Active Date MDM Diagnosis E11.621 Type 2 diabetes mellitus with foot ulcer 08/22/2022 No Yes L97.522 Non-pressure chronic ulcer of other part of left foot with fat layer exposed 08/22/2022 No Yes T81.31XA Disruption of external operation (surgical) wound, not elsewhere classified, 08/22/2022 No Yes initial encounter Martin Mullen, Martin Mullen ([id number]) [id number] Page 5 of 8 [phone] Non-pressure chronic ulcer of other part of right foot with fat layer exposed 08/22/2022 No Yes I10 Essential (primary) hypertension 08/22/2022 No Yes I25.10 Atherosclerotic heart disease of native coronary artery without angina pectoris 08/22/2022 No Yes Inactive Problems Resolved Problems Electronic Signature(s) Signed: 09/12/2022 3:04:17 PM By: Allen Derry PA-C Entered By: Allen Derry on 09/12/2022 15:04:17 -------------------------------------------------------------------------------- Progress Note Details Patient Name: Date of Service: Martin Mullen ER, [name] 09/12/2022 3:15 PM Medical Record Number: [id number] Patient Account Number: [id number] Date of Birth/Sex: Treating RN: 23-May-1980 (42 y.o. Martin Mullen Primary Care Provider: [name] Other Clinician: Referring Provider: Treating Provider/Extender: Carron Curie in Treatment: 3 Subjective Chief Complaint Information obtained from Patient Bilateral foot ulcers History of Present Illness (HPI) 10/11/18 patient presents today for initial evaluation our clinic. He is being seen due to a left great toe ulcer which unfortunately has been present for quite some time. Initially he thought it really wasn't much of anything and then subsequently noted that he had some drainage then  that seem to dry off and he didn't worry too much about it until more recently when he is had issues. 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 Subsequently he tells me he has not utilized any recreational drugs for two years. This is obviously excellent news. He also does have diabetes type II. It does sound like he is trying to improve things as far as his overall health status is concerned although his hemoglobin A-1 C as measured on 09/27/18 was 10.7. He did have an x-ray as well on 09/28/18 and this was negative for any evidence of acute or chronic osteomyelitis or fracture. Patient's wound is not appear to be severely infected at this time which is good news. With that being said he also doesn't really have any pain this is secondary to neuropathy. No fevers, chills, nausea, or vomiting noted at this time. 10/18/2018 on evaluation today patient  actually appears to be doing better with regard to his toe ulcer. He has been tolerating the dressing changes without complication. Fortunately there is no signs of active infection at this time. Overall very pleased in this regard and how things are doing. Patient likewise is pleased and happy to see that things are doing much better. Still he does have some ways to go to get this area to completely close. 10/30/2018 on evaluation today patient's ulcer on his plantar foot actually appears to be doing quite well at this time. He has been tolerating the dressing changes without complication which is excellent news. With that being said I do believe he is making progress there is some callus buildup on without the clear away today but other than that I am seeing evidence that this seems to be filling in from the base and is getting much smaller in the base. Still were not at the point of complete closure yet. 11/09/2018 on evaluation today patient  actually appears to be doing quite well with regard to his toe ulcer from the standpoint of infection. I do not see any signs of active infection at this time which is good news. With that being said he unfortunately is not really making much progress either he continues to have depth of the wound despite the fact that it seems to be callused over initially on inspection when I probed and actually remove some of the callus it is much deeper. Fortunately there is no signs of active infection at this time. 11/16/2018 on evaluation today patient's toe ulcer actually appears to be doing about the same. There is really no significant improvement compared to last time I saw him. There is some debridement necessary today he has callus covering the wound area I think he may be a good candidate for total contact cast to rather rapidly get this area to close. We previously have discussed this although we have not initiated it yet I think it may be time to do so and go ahead and get this closed. 11/22/2018 on evaluation today patient appears to be doing better with regard to the overall size of his wound. He has been tolerating the dressing changes Martin Mullen, Martin Mullen ([id number]) [id number] Page 6 of 8 without complication. Fortunately there is no signs of active infection at this time. No fevers, chills, nausea, vomiting, or diarrhea. 11/29/2018 on evaluation today patient appears to be doing about the same really with regard to his toe ulcer. He is not showing any signs of dramatic improvement unfortunately. There is no signs of infection at this time. No fevers, chills, nausea, vomiting, or diarrhea. I really think we need to consider the total contact cast at this point in order to go ahead and get this closed. 12/04/2018 on evaluation today patient appears to be doing about the same with regard to his great toe ulcer. He is here today for the initiation of the total contact cast.  Unfortunately he notes he did clip his toenail somewhat short on the second toe of the same foot he does have a slight paronychia noted at this point. There is some erythema is not spreading into the foot if just in the distal portion of the toe there is a little bit of evidence of fluid/pus buildup at the toenail margin. Nonetheless this is something that I definitely think we probably should treat just to make sure nothing gets worse while we apply the cast he still wants to apply the cast today we  will be seeing him back in 2 days anyway to reapply the cast so I do not see any problems in that regard. 12/06/2018 on evaluation today patient appears to be measuring smaller with regard to his wound. The total contact cast just in 2 days seems to have already helped he is here for repeat application today. Fortunately there is no signs of active infection at this time. No fevers, chills, nausea, vomiting, or diarrhea. He did unfortunately get the cast wet this morning taking a shower he attempted to use a trash bag and it failed. I believe he should get a cast protector which would be better as far as helping to avoid this from happening again. 10/29; the patient had a raised surface eschar over the wound area. Gently debrided with a #5 curette. The vast majority of this had closed however he had 1 small open area that still had perhaps 2 mm of depth. With considerable difficulty I was able to convince him to put the cast on for another few days. The option would have been a Pegasys shoe. 12/18/2018 upon evaluation today patient appears to be doing very well with regard to his wound on the toe. In fact this appears to be completely healed which is great news. There is no evidence of active infection at this time which is also good news. No fevers, chills, nausea, vomiting, or diarrhea. Overall the patient is very pleased with how things seem to have progressed. Readmission: 08-22-2022 this is a gentleman  whom I have seen previously. In fact it was last in November 2020 that I saw him. Since that time apparently there is quite a bit that has occurred in fact he has had 2 strokes as well as before I knew he had had a heart attack but unfortunately the strokes have altered his capacity as far as both the mental side is concerned with speech as well as even remembering things and it also affected his motor function especially in his hands. His walk is also not as good as it was. Nonetheless he is seen today as a referral from Dr. Allena Katz at [facility]. Dr. Allena Katz did do surgery on this gentleman after he had revascularization and May on the 15th of 2024. Subsequently after the revascularization and stent placement by Dr. Wyn Quaker Dr. 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 Unfortunately it did not remain closed and dehisced presumably due to the fact that the patient states he was walking on it. He really does not have any evidence at this point of systemic infection which is good news but unfortunately the wound is doing quite poorly. He also has a big area of callus on the left foot this can require debridement today there may be a wound under this as well but appear. 08-29-23 upon evaluation today patient unfortunately appears to be doing not so well in regard to his foot ulcer. Since I last saw him he has not had the dressing changed even 1 time. He had matted hair which apparently is Here in the wound bed which required quite a bit of cleaning to clear out by the nurse prior to my coming in. With that being said I think that if he is not careful he is at high risk for losing his leg if this becomes subsequently infected. I discussed that with him today. Unfortunately he had home health but since we  sent him orders for wound care they discharged him stating "they did not have any staff". Again this is  something that to be peripherally honest we run into many times with patients with Medicaid or insurance carriers I do not pay well for home health services that we will be told they do not have "staph" when really my assumption is they do not want to take on the patient as they will accept other patients with Medicare otherwise. There is only a small number of Medicaid patients that it will help company will take on. 7/22;. 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 This has considerable depth. We have been using Hydrofera Blue packing rope and he is coming in once a week to see the provider and having to nurse visits. He is apparently not eligible for home health and Medicaid is the primary insurance. 09-12-2022 upon evaluation today patient unfortunately appears to be doing significantly worse in regard to his wound. This in fact looks very dusky at the periwound location he also has a cyanosis noted of the wound bed this is very different from even when I saw him 2 weeks ago. His capillary refill in the toe was easily between 15 and 20 seconds significantly elevated compared to what it should be. I think he needs to go to the ER right now in order to have this checked out I think he is at risk of losing his foot if he does not he also states that he is having pain in the calf "like I did when I had to have my toe amputated". Objective Constitutional Well-nourished and well-hydrated in no acute distress. Vitals Time Taken: 3:35 PM, Temperature: 98 F, Pulse: 117 bpm, Respiratory Rate: 20 breaths/min, Blood Pressure: 117/73 mmHg. Respiratory normal breathing without difficulty. Psychiatric this patient is able to make decisions and demonstrates good insight into disease process. Alert and Oriented x 3. pleasant and cooperative. General Notes: Upon inspection patient's wound bed again showed signs of being cyanotic in my opinion he has capillary refill that is  somewhere between 15 to 20 seconds that is definitely not good. I think that he needs to go as quickly as possible to the ER for evaluation specifically I think he needs vascular evaluation to see if anything is clotted off of the previous work that was done on his vasculature in the lower extremity. I feel that something may have clotted off and he may need more urgent intervention in order to keep from losing his foot. Integumentary (Hair, Skin) Wound #2 status is Open. Original cause of wound was Surgical Injury. The date acquired was: 06/30/2022. The wound has been in treatment 3 weeks. The wound is located on the Right,Midline T Second. The wound measures 3.2cm length x 0.5cm width x 3.5cm depth; 1.257cm^2 area and 4.398cm^3 volume. oe There is Fat Layer (Subcutaneous Tissue) exposed. There is no tunneling or undermining noted. There is a medium amount of serosanguineous drainage noted. There is medium (34-66%) red, pink granulation within the wound bed. There is a medium (34-66%) amount of necrotic tissue within the wound bed including Adherent Slough. Martin Mullen, Martin Mullen ([id number]) [id number] Page 7 of 8 Assessment Active Problems ICD-10 Type 2 diabetes mellitus with foot ulcer Non-pressure chronic ulcer of other part of left foot with fat layer exposed Disruption of external operation (surgical) wound, not elsewhere classified, initial encounter Non-pressure chronic ulcer of other part of right foot with fat layer exposed Essential (  primary) hypertension Atherosclerotic heart disease of native coronary artery without angina pectoris Plan 1. I am going to recommend that he go to the ER as soon as possible he stated that he is going to go to [facility] I am perfectly fine with that I will go ahead and put the note in which have given him a copy of this note to take as well so they know what is going on and why I am sending him. 2. The big reason that I  am sending to the ER if the wound is cyanotic as well as the capillary refill being between 15 to 20 seconds which is definitely abnormal. This seems to be a change from when I saw him 2 weeks ago I think that he may have something that is clotted off in regard to the vascular intervention previously undertaken I think that this needs to be addressed as quickly as possible to provide limb salvage otherwise I think he is at high risk for losing his foot which we are trying to avoid. The patient voiced understanding and is going to go to the ER at [facility]. We will see patient back for reevaluation in 1 week here in the clinic. If anything worsens or changes patient will contact our office for additional recommendations. Addendum: I did speak with Mardella Layman the nurse at the ER to let her know that the patient was coming and what I was concerned about. She voiced understanding and took the note and is going to be looking out for him to head straight over there he supposed to leave from here and go straight over to the ER. Electronic Signature(s) Signed: 09/12/2022 3:53:58 PM By: Allen Derry PA-C Previous Signature: 09/12/2022 3:51:13 PM Version By: Allen Derry PA-C Entered By: Allen Derry on 09/12/2022 15:53:58 -------------------------------------------------------------------------------- SuperBill Details Patient Name: Date of Service: Martin Mullen ER, [name] 09/12/2022 Medical Record Number: [id number] Patient Account Number: [id number] Date of Birth/Sex: Treating RN: 01-05-1981 (42 y.o. Martin Mullen Primary Care Provider: [name] Other Clinician: Referring Provider: Treating Provider/Extender: Carron Curie in Treatment: 3 Diagnosis Coding ICD-10 Codes Code Description [phone] Type 2 diabetes mellitus with foot ulcer L97.522 Non-pressure chronic ulcer of other part of left foot with fat layer exposed T81.31XA Disruption of external operation (surgical) wound,  not elsewhere classified, initial encounter L97.512 Non-pressure chronic ulcer of other part of right foot with fat layer exposed I10 Essential (primary) hypertension I25.10 Atherosclerotic heart disease of native coronary artery without angina pectoris Facility Procedures Martin Mullen, Martin Mullen ([id number]): CPT4 Code Description [id number] [phone] - WOUND CARE VISIT-LEV 2 EST PT [id number] Page 8 of 8: Modifier Quantity 1 Physician Procedures : CPT4 Code Description Modifier [phone] - WC PHYS LEVEL 5 - EST PT ICD-10 Diagnosis Description E11.621 Type 2 diabetes mellitus with foot ulcer L97.522 Non-pressure chronic ulcer of other part of left foot with fat layer exposed T81.31XA  Disruption of external operation (surgical) wound, not elsewhere classified, initial encounter L97.512 Non-pressure chronic ulcer of other part of right foot with fat layer exposed Quantity: 1 Electronic Signature(s) Signed: 09/12/2022 4:11:25 PM By: Yevonne Pax RN Signed: 09/12/2022 5:01:36 PM By: Allen Derry PA-C Previous Signature: 09/12/2022 3:51:41 PM Version By: Allen Derry PA-C Entered By: Yevonne Pax on 09/12/2022 16:11:25

## 2022-09-12 NOTE — Assessment & Plan Note (Signed)
-   Resume home antihypertensive regimen

## 2022-09-12 NOTE — Assessment & Plan Note (Addendum)
Patient is presenting with increased right foot pain with findings suggestive of limb ischemia.  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  I suspect this is due to patient's self discontinuation of Eliquis.  Does not appear acutely infected  - Vascular surgery consulted; appreciate their recommendations - Heparin infusion per pharmacy dosing - N.p.o. after midnight - Angiography scheduled for tomorrow - Frequent neurovascular checks - Oxycodone as needed for pain control

## 2022-09-12 NOTE — Progress Notes (Addendum)
Martin Mullen, Martin Mullen ([id number]) [phone].pdf Page 1 of 8 Visit Report for 09/12/2022 Arrival Information Details Patient Name: Date of Service: Martin Mullen ER, SCO TTY Mullen. 09/12/2022 3:15 PM Medical Record Number: [id number] Patient Account Number: [id number] Date of Birth/Sex: Treating RN: [birth year] (42 y.Martin. Martin Mullen) Martin Mullen Primary Care Martin Mullen: Martin Mullen Other Clinician: Referring Martin Mullen: Treating Martin Mullen/Extender: Martin Mullen in Treatment: 3 Visit Information History Since Last Visit Added or deleted any medications: No Patient Arrived: Ambulatory Any new allergies or adverse reactions: No Arrival Time: 15:31 Had a fall or experienced change in No Accompanied By: self activities of daily living that may affect Transfer Assistance: None risk of falls: Patient Identification Verified: Yes Signs or symptoms of abuse/neglect since last visito No Secondary Verification Process Completed: Yes Hospitalized since last visit: No Patient Requires Transmission-Based Precautions: No Implantable device outside of the clinic excluding No Patient Has Alerts: Yes cellular tissue based products placed in the center Patient Alerts: Patient on Blood Thinner since last visit: Eliquis Has Dressing in Place as Prescribed: Yes Diabetic Type 2 Pain Present Now: No Electronic Signature(s) Signed: 09/14/2022 4:21:14 PM By: Martin Pax RN Entered By: Martin Mullen on 09/12/2022 15:35:39 -------------------------------------------------------------------------------- Clinic Level of Care Assessment Details Patient Name: Date of Service: Martin LIV ER, SCO TTY Mullen. 09/12/2022 3:15 PM Medical Record Number: [id number] Patient Account Number: [id number] Date of Birth/Sex: Treating RN: 11/16/1980 (42 y.Martin. Martin Mullen Primary Care Martin Mullen: Martin Mullen Other Clinician: Referring Martin Mullen: Treating Martin Mullen/Extender: Martin Mullen in Treatment:  3 Clinic Level of Care Assessment Items TOOL 4 Quantity Score X- 1 0 Use when only an EandM is performed on FOLLOW-UP visit ASSESSMENTS - Nursing Assessment / Reassessment X- 1 10 Reassessment of Co-morbidities (includes updates in patient status) X- 1 5 Reassessment of Adherence to Treatment Plan [name], [name] ([id number]) [phone].pdf Page 2 of 8 ASSESSMENTS - Wound and Skin A ssessment / Reassessment X - Simple Wound Assessment / Reassessment - one wound 1 5 []  - 0 Complex Wound Assessment / Reassessment - multiple wounds []  - 0 Dermatologic / Skin Assessment (not related to wound area) ASSESSMENTS - Focused Assessment []  - 0 Circumferential Edema Measurements - multi extremities []  - 0 Nutritional Assessment / Counseling / Intervention []  - 0 Lower Extremity Assessment (monofilament, tuning fork, pulses) []  - 0 Peripheral Arterial Disease Assessment (using hand held doppler) ASSESSMENTS - Ostomy and/or Continence Assessment and Care []  - 0 Incontinence Assessment and Management []  - 0 Ostomy Care Assessment and Management (repouching, etc.) PROCESS - Coordination of Care X - Simple Patient / Family Education for ongoing care 1 15 []  - 0 Complex (extensive) Patient / Family Education for ongoing care []  - 0 Staff obtains Chiropractor, Records, T Results / Process Orders est []  - 0 Staff telephones HHA, Nursing Homes / Clarify orders / etc []  - 0 Routine Transfer to another Facility (non-emergent condition) []  - 0 Routine Hospital Admission (non-emergent condition) []  - 0 New Admissions / Manufacturing engineer / Ordering NPWT Apligraf, etc. , []  - 0 Emergency Hospital Admission (emergent condition) X- 1 10 Simple Discharge Coordination []  - 0 Complex (extensive) Discharge Coordination PROCESS - Special Needs []  - 0 Pediatric / Minor Patient Management []  - 0 Isolation Patient Management []  - 0 Hearing / Language / Visual special  needs []  - 0 Assessment of Community assistance (transportation, D/C planning, etc.) []  - 0 Additional assistance / Altered mentation []  - 0 Support Surface(s) Assessment (bed, cushion,  cm) 3.5 Area: (cm) 1.257 Volume: (cm) 4.398 % Reduction in Area: 38.2% % Reduction in Volume: 39.9% Epithelialization: None Tunneling: No Undermining: No Wound Description Classification: Full Thickness With Exposed Support Structures Exudate Amount: Medium Martin Mullen ([id number]) Exudate Type: Serosanguineous Exudate Color: red, brown Foul Odor After Cleansing: No Slough/Fibrino Yes [phone].pdf Page 8 of 8 Wound Bed Granulation Amount: Medium (34-66%) Exposed Structure Granulation Quality: Red, Pink Fascia Exposed: No Necrotic Amount: Medium (34-66%) Fat  Layer (Subcutaneous Tissue) Exposed: Yes Necrotic Quality: Adherent Slough Tendon Exposed: No Muscle Exposed: No Joint Exposed: No Bone Exposed: No Electronic Signature(s) Signed: 09/14/2022 4:21:14 PM By: Martin Pax RN Entered By: Martin Mullen on 09/12/2022 15:39:24 -------------------------------------------------------------------------------- Vitals Details Patient Name: Date of Service: Martin Mullen ER, SCO TTY Mullen. 09/12/2022 3:15 PM Medical Record Number: [id number] Patient Account Number: [id number] Date of Birth/Sex: Treating RN: [birth year] (42 y.Martin. Martin Mullen Primary Care Asahd Can: Martin Mullen Other Clinician: Referring Iylah Dworkin: Treating Paxtyn Wisdom/Extender: Martin Mullen in Treatment: 3 Vital Signs Time Taken: 15:35 Temperature (F): 98 Pulse (bpm): 117 Respiratory Rate (breaths/min): 20 Blood Pressure (mmHg): 117/73 Reference Range: 80 - 120 mg / dl Electronic Signature(s) Signed: 09/14/2022 4:21:14 PM By: Martin Pax RN Entered By: Martin Mullen on 09/12/2022 15:36:12  seat, etc.) INTERVENTIONS - Wound Cleansing / Measurement X - Simple Wound Cleansing - one wound 1 5 []  - 0 Complex Wound Cleansing - multiple wounds X- 1 5 Wound Imaging (photographs - any number of wounds) []  - 0 Wound Tracing (instead of photographs) X- 1 5 Simple Wound Measurement - one wound []  - 0 Complex Wound Measurement - multiple wounds INTERVENTIONS - Wound Dressings []  - 0 Small Wound Dressing one or multiple wounds []  - 0 Medium Wound Dressing one or multiple wounds []  - 0 Large Wound Dressing one or multiple wounds []  - 0 Application of Medications - topical []  - 0 Application of Medications - injection INTERVENTIONS - Miscellaneous []  - 0 External ear exam HOSTEEN, KIENAST ([id number]) [phone].pdf Page 3 of 8 []  - 0 Specimen Collection (cultures, biopsies, blood, body fluids, etc.) []  - 0 Specimen(s) / Culture(s) sent or taken to Lab for analysis []  - 0 Patient Transfer (multiple staff / Michiel Sites Lift / Similar devices) []  - 0 Simple Staple / Suture removal (25 or less) []  - 0 Complex Staple / Suture removal (26 or more) []  - 0 Hypo / Hyperglycemic Management (close monitor of Blood Glucose) []  - 0 Ankle / Brachial Index (ABI) - do not check if billed separately X- 1 5 Vital Signs Has the patient been seen at the hospital within the last three years: Yes Total Score: 65 Level Of Care: New/Established - Level 2 Electronic Signature(s) Signed: 09/14/2022 4:21:14 PM By: Martin Pax RN Entered By: Martin Mullen on 09/12/2022 16:11:13 -------------------------------------------------------------------------------- Encounter Discharge Information Details Patient Name: Date of Service: Martin Mullen ER, SCO TTY Mullen. 09/12/2022 3:15 PM Medical Record Number:  [id number] Patient Account Number: [id number] Date of Birth/Sex: Treating RN: 10-26-1980 (42 y.Martin. Martin Mullen Primary Care Danahi Reddish: Martin Mullen Other Clinician: Referring Avri Paiva: Treating Emon Miggins/Extender: Martin Mullen in Treatment: 3 Encounter Discharge Information Items Discharge Condition: Stable Ambulatory Status: Wheelchair Discharge Destination: Home Transportation: Private Auto Accompanied By: self Schedule Follow-up Appointment: Yes Clinical Summary of Care: Electronic Signature(s) Signed: 09/12/2022 3:54:25 PM By: Martin Pax RN Entered By: Martin Mullen on 09/12/2022 15:54:25 -------------------------------------------------------------------------------- Lower Extremity Assessment Details Patient Name: Date of Service: Martin Mullen ER, SCO TTY Mullen. 09/12/2022 3:15 PM Mayo Ao ([id number]) [id number] Page 4 of 8 Medical Record Number: [id number] Patient Account Number: [id number] Date of Birth/Sex: Treating RN: 1980-11-15 (42 y.Martin. Martin Mullen) Martin Mullen Primary Care Aisley Whan: Martin Mullen Other Clinician: Referring Chellsie Gomer: Treating Lillieann Pavlich/Extender: Martin Mullen in Treatment: 3 Vascular Assessment Pulses: Dorsalis Pedis Palpable: [Right:Yes] Extremity colors, hair growth, and conditions: Extremity Color: [Right:Dusky] Hair Growth on Extremity: [Right:No] Temperature of Extremity: [Right:Cool] Capillary Refill: [Right:> 3 seconds] Dependent Rubor: [Right:Yes] Blanched when Elevated: [Right:No No] Toe Nail Assessment Left: Right: Thick: Yes Discolored: Yes Deformed: Yes Improper Length and Hygiene: Yes Electronic Signature(s) Signed: 09/12/2022 3:52:21 PM By: Martin Pax RN Entered By: Martin Mullen on 09/12/2022 15:52:21 -------------------------------------------------------------------------------- Multi Wound Chart Details Patient Name: Date of Service: Martin Mullen ER, SCO TTY Mullen. 09/12/2022 3:15  PM Medical Record Number: [id number] Patient Account Number: [id number] Date of Birth/Sex: Treating RN: Mar 09, 1980 (42 y.Martin. Martin Mullen Primary Care Maddax Palinkas: Martin Mullen Other Clinician: Referring Kyser Wandel: Treating Margarita Bobrowski/Extender: Martin Mullen in Treatment: 3 Vital Signs Height(in): Pulse(bpm): 117 Weight(lbs): Blood Pressure(mmHg): 117/73 Body Mass Index(BMI): Temperature(F): 98 Respiratory Rate(breaths/min): 20 [2:Photos:] [N/A:N/A] Right, Midline T Second oe N/A N/A Wound Location: Surgical Injury N/A N/A Wounding Event: SAVEON, PLANT ([id number]) [id number]  Martin Mullen, Martin Mullen ([id number]) [phone].pdf Page 1 of 8 Visit Report for 09/12/2022 Arrival Information Details Patient Name: Date of Service: Martin Mullen ER, SCO TTY Mullen. 09/12/2022 3:15 PM Medical Record Number: [id number] Patient Account Number: [id number] Date of Birth/Sex: Treating RN: [birth year] (42 y.Martin. Martin Mullen) Martin Mullen Primary Care Martin Mullen: Martin Mullen Other Clinician: Referring Martin Mullen: Treating Martin Mullen/Extender: Martin Mullen in Treatment: 3 Visit Information History Since Last Visit Added or deleted any medications: No Patient Arrived: Ambulatory Any new allergies or adverse reactions: No Arrival Time: 15:31 Had a fall or experienced change in No Accompanied By: self activities of daily living that may affect Transfer Assistance: None risk of falls: Patient Identification Verified: Yes Signs or symptoms of abuse/neglect since last visito No Secondary Verification Process Completed: Yes Hospitalized since last visit: No Patient Requires Transmission-Based Precautions: No Implantable device outside of the clinic excluding No Patient Has Alerts: Yes cellular tissue based products placed in the center Patient Alerts: Patient on Blood Thinner since last visit: Eliquis Has Dressing in Place as Prescribed: Yes Diabetic Type 2 Pain Present Now: No Electronic Signature(s) Signed: 09/14/2022 4:21:14 PM By: Martin Pax RN Entered By: Martin Mullen on 09/12/2022 15:35:39 -------------------------------------------------------------------------------- Clinic Level of Care Assessment Details Patient Name: Date of Service: Martin LIV ER, SCO TTY Mullen. 09/12/2022 3:15 PM Medical Record Number: [id number] Patient Account Number: [id number] Date of Birth/Sex: Treating RN: 11/16/1980 (42 y.Martin. Martin Mullen Primary Care Martin Mullen: Martin Mullen Other Clinician: Referring Martin Mullen: Treating Martin Mullen/Extender: Martin Mullen in Treatment:  3 Clinic Level of Care Assessment Items TOOL 4 Quantity Score X- 1 0 Use when only an EandM is performed on FOLLOW-UP visit ASSESSMENTS - Nursing Assessment / Reassessment X- 1 10 Reassessment of Co-morbidities (includes updates in patient status) X- 1 5 Reassessment of Adherence to Treatment Plan [name], [name] ([id number]) [phone].pdf Page 2 of 8 ASSESSMENTS - Wound and Skin A ssessment / Reassessment X - Simple Wound Assessment / Reassessment - one wound 1 5 []  - 0 Complex Wound Assessment / Reassessment - multiple wounds []  - 0 Dermatologic / Skin Assessment (not related to wound area) ASSESSMENTS - Focused Assessment []  - 0 Circumferential Edema Measurements - multi extremities []  - 0 Nutritional Assessment / Counseling / Intervention []  - 0 Lower Extremity Assessment (monofilament, tuning fork, pulses) []  - 0 Peripheral Arterial Disease Assessment (using hand held doppler) ASSESSMENTS - Ostomy and/or Continence Assessment and Care []  - 0 Incontinence Assessment and Management []  - 0 Ostomy Care Assessment and Management (repouching, etc.) PROCESS - Coordination of Care X - Simple Patient / Family Education for ongoing care 1 15 []  - 0 Complex (extensive) Patient / Family Education for ongoing care []  - 0 Staff obtains Chiropractor, Records, T Results / Process Orders est []  - 0 Staff telephones HHA, Nursing Homes / Clarify orders / etc []  - 0 Routine Transfer to another Facility (non-emergent condition) []  - 0 Routine Hospital Admission (non-emergent condition) []  - 0 New Admissions / Manufacturing engineer / Ordering NPWT Apligraf, etc. , []  - 0 Emergency Hospital Admission (emergent condition) X- 1 10 Simple Discharge Coordination []  - 0 Complex (extensive) Discharge Coordination PROCESS - Special Needs []  - 0 Pediatric / Minor Patient Management []  - 0 Isolation Patient Management []  - 0 Hearing / Language / Visual special  needs []  - 0 Assessment of Community assistance (transportation, D/C planning, etc.) []  - 0 Additional assistance / Altered mentation []  - 0 Support Surface(s) Assessment (bed, cushion,

## 2022-09-12 NOTE — Progress Notes (Signed)
ANTICOAGULATION CONSULT NOTE - Initial Consult  Pharmacy Consult for heparin Indication:  arterial occlusion of RLE, hx PAD  No Known Allergies  Patient Measurements: Height: 5\' 11"  (180.3 cm) Weight: 86.2 kg (190 lb 0.6 oz) IBW/kg (Calculated) : 75.3 Heparin Dosing Weight: 86.2 kg  Vital Signs: Temp: 98.7 F (37.1 C) (07/29 1611) Temp Source: Oral (07/29 1611) BP: 126/86 (07/29 1611) Pulse Rate: 115 (07/29 1611)  Labs: Recent Labs    09/12/22 1617  HGB 17.1*  HCT 49.2  PLT 238  CREATININE 1.01    Estimated Creatinine Clearance: 101.5 mL/min (by C-G formula based on SCr of 1.01 mg/dL).   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   Chicken pox    Coronary artery disease    Inferior ST elevation myocardial infarction in July of 2014. Cardiac catheterization showed 95% distal RCA stenosis and 90% first diagonal stenosis with normal ejection fraction. He underwent PCI in bare-metal stent placement to the distal RCA.   Diabetes mellitus without complication (HCC)    Hypercholesterolemia    Stroke (HCC)    Tobacco use    Assessment: 42 y/o male presenting from outpatient wound care center due to pain and cyanotic vasculature in his right leg. Pharmacy has been consulted to dose heparin. 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   Goal of Therapy:  Heparin level 0.3-0.7 units/ml Monitor platelets by anticoagulation protocol: Yes   Plan:  Give heparin 5100 units x1 Start heparin infusion at 1400 units/hour Check 6-hour Anti-Xa level Monitor daily Anti-Xa levels while on heparin Check daily CBC and signs/symptoms of bleeding  Thank you for involving pharmacy in this patient's care.   Rockwell Alexandria, PharmD Clinical Pharmacist 09/12/2022 5:05 PM

## 2022-09-12 NOTE — ED Triage Notes (Signed)
Pt here with a circulatory problem. Pt here from his primary office c/o cyanotic vasculature in his right leg. Pt c/o pain in the affected leg as well.

## 2022-09-12 NOTE — Assessment & Plan Note (Signed)
Patient denies any chest pain at this time.  He has not been taking his home Eliquis, however as noted above, plan is to restart prior to discharge.  - Continue home aspirin and statin

## 2022-09-12 NOTE — ED Provider Notes (Signed)
[facility] Provider Note    Event Date/Time   First MD Initiated Contact with Patient 09/12/22 1656     (approximate)  History   Chief Complaint: Circulatory Problem  HPI  Martin Mullen is a 42 y.o. male with a past medical history of MI, diabetes, hyperlipidemia, prior CVA, presents to the emergency department for right lower extremity pain.  According to the patient since yesterday he has been experiencing pain in the right lower extremity below the knee, worse with movement.  Patient has a history of peripheral artery disease, has a second toe amputation in May that is still healing, has had 3 CVAs 2 and 2022, 1 in 2023.  Patient denies any chest pain or shortness of breath.  No fever.  Currently rates the pain as a 2/10.  Physical Exam   Triage Vital Signs: ED Triage Vitals  Encounter Vitals Group     BP 09/12/22 1611 126/86     Systolic BP Percentile --      Diastolic BP Percentile --      Pulse Rate 09/12/22 1611 (!) 115     Resp 09/12/22 1611 19     Temp 09/12/22 1611 98.7 F (37.1 C)     Temp Source 09/12/22 1611 Oral     SpO2 09/12/22 1611 98 %     Weight 09/12/22 1613 190 lb (86.2 kg)     Height 09/12/22 1613 5\' 11"  (1.803 m)     Head Circumference --      Peak Flow --      Pain Score 09/12/22 1616 2     Pain Loc --      Pain Education --      Exclude from Growth Chart --     Most recent vital signs: Vitals:   09/12/22 1611  BP: 126/86  Pulse: (!) 115  Resp: 19  Temp: 98.7 F (37.1 C)  SpO2: 98%    General: Awake, no distress.  CV:  Good peripheral perfusion.  Regular rate and rhythm  Resp:  Normal effort.  Equal breath sounds bilaterally.  Abd:  No distention.  Soft, nontender.  No rebound or guarding. Other:  Patient has somewhat dusky appearing foot although warm to palpation, somewhat pale appearing toes especially third toe that is somewhat cool to the touch.  Able to Doppler a strong medial tibialis pulse but no DP  pulse on Doppler.   ED Results / Procedures / Treatments   RADIOLOGY  Ultrasound shows occlusion of the right popliteal artery stent   MEDICATIONS ORDERED IN ED: Medications - No data to display   IMPRESSION / MDM / ASSESSMENT AND PLAN / ED COURSE  I reviewed the triage vital signs and the nursing notes.  Patient's presentation is most consistent with acute presentation with potential threat to life or bodily function.  Patient presents emergency department for right lower extremity pain.  Unable to Doppler a DP pulse.  Will start the patient on heparin.  Patient's labs show a leukocytosis of 15,000, chemistry shows no significant findings reassuringly normal renal function mild hyperglycemia.  We will obtain venous and arterial ultrasounds of the right lower extremity.  Will discuss with vascular surgery and start the patient on a heparin infusion.  Ultrasound confirms occlusion of the right popliteal artery stent (patient denied having any stents or at least knowing of any stents).  I spoke to Dr. Gilda Crease who will plan to do an angio tomorrow.  Patient mid to the  hospital service on heparin.  CRITICAL CARE Performed by: Minna Antis   Total critical care time: 30 minutes  Critical care time was exclusive of separately billable procedures and treating other patients.  Critical care was necessary to treat or prevent imminent or life-threatening deterioration.  Critical care was time spent personally by me on the following activities: development of treatment plan with patient and/or surrogate as well as nursing, discussions with consultants, evaluation of patient's response to treatment, examination of patient, obtaining history from patient or surrogate, ordering and performing treatments and interventions, ordering and review of laboratory studies, ordering and review of radiographic studies, pulse oximetry and re-evaluation of patient's condition.   FINAL CLINICAL  IMPRESSION(S) / ED DIAGNOSES   Arterial occlusion, right lower extremity    Note:  This document was prepared using Dragon voice recognition software and may include unintentional dictation errors.   Minna Antis, MD 09/12/22 2312

## 2022-09-12 NOTE — H&P (Signed)
History and Physical    Patient: Martin Mullen ZDG:[id number] DOB: January 21, 1981 DOA: 09/12/2022 DOS: the patient was seen and examined on 09/12/2022 PCP: [name], NP  Patient coming from: Home  Chief Complaint:  Chief Complaint  Patient presents with   Circulatory Problem   HPI: Martin Mullen is a 42 y.o. 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  Mr. Padberg states that he noticed that his right foot was becoming more painful over the last 2 days, especially with standing.  He denies noticing any swelling in his legs though.  He has noticed some drainage, but states it did not look purulent.  He denies any fever, chills, nausea, vomiting, diarrhea, shortness of breath, chest pain, palpitations.  He notes that he has not been taking his Eliquis for approximately 3 months now.  When asked him why he discontinued, he stated he no longer wanted to take it but did not have any particular reason for that.  Per chart review, patient was seen at the wound care center today.  At the time, it was noted that his left second toe amputation site looks significantly worse with cyanosis.  This was noted to be a change compared to 2 weeks prior.  Due to this, he was instructed to go to the ED.  ED course: On arrival to the ED, patient was normotensive at 126/86 with heart rate of 115.  He was saturating at 98% on room air.  He was afebrile at 98.7.  Initial workup notable for WBC of 15.5, hemoglobin of 17.1, platelets of 238, sodium of 134, glucose of 288, creatinine 1.01 with GFR above 60.  Vascular surgery was consulted and patient started on IV heparin.  TRH contacted for admission.  Review of Systems:  As mentioned in the history of present illness. All other systems reviewed and are negative.  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   Chicken pox    Coronary artery disease    Inferior ST elevation myocardial infarction in July of 2014. Cardiac catheterization showed 95% distal RCA stenosis and 90% first diagonal stenosis with normal ejection fraction. He underwent PCI in bare-metal stent placement to the distal RCA.   Diabetes mellitus without complication (HCC)    Hypercholesterolemia    Stroke [facility])    Tobacco use    Past Surgical History:  Procedure Laterality Date   AMPUTATION TOE Right 06/27/2022   Procedure: AMPUTATION TOE RIGHT SECOND TOE;  Surgeon: Candelaria Stagers, DPM;  Location: [facility] ORS;  Service: Podiatry;  Laterality: Right;   CORONARY ANGIOPLASTY WITH STENT PLACEMENT     DEBRIDEMENT AND CLOSURE WOUND N/A 09/03/2019   Procedure: Debridement and closure of scrotal wound;  Surgeon: Allena Napoleon, MD;  Location: [facility];  Service: Plastics;  Laterality: N/A;   DEBRIDEMENT AND CLOSURE WOUND N/A 07/25/2019   Procedure: DEBRIDEMENT AND CLOSURE WOUND;  Surgeon: Allena Napoleon, MD;  Location: [facility];  Service: Plastics;  Laterality: N/A;   INCISION AND DRAINAGE Right 06/27/2022   Procedure: INCISION AND DRAINAGE;  Surgeon: Candelaria Stagers, DPM;  Location: [facility];  Service: Podiatry;  Laterality: Right;  INCISION AND DRAINAGE ABSCESS N/A 07/20/2019   Procedure: INCISION AND DRAINAGE ABSCESS;  Surgeon: Crista Elliot, MD;  Location: [facility];  Service: Urology;  Laterality: N/A;   INCISION AND DRAINAGE OF WOUND Right 06/30/2022   Procedure: IRRIGATION AND DEBRIDEMENT WOUND WITH CLOSURE;  Surgeon: Candelaria Stagers, DPM;  Location: [facility];  Service: Podiatry;  Laterality: Right;   LEFT HEART CATHETERIZATION WITH CORONARY ANGIOGRAM N/A 08/28/2012   Procedure: LEFT HEART  CATHETERIZATION WITH CORONARY ANGIOGRAM;  Surgeon: Pamella Pert, MD;  Location: [facility] CATH LAB;  Service: Cardiovascular;  Laterality: N/A;   LOWER EXTREMITY ANGIOGRAPHY Right 06/29/2022   Procedure: Lower Extremity Angiography;  Surgeon: Annice Needy, MD;  Location: [facility] INVASIVE CV LAB;  Service: Cardiovascular;  Laterality: Right;   TEE WITHOUT CARDIOVERSION N/A 09/23/2020   Procedure: TRANSESOPHAGEAL ECHOCARDIOGRAM (TEE);  Surgeon: Lamar Blinks, MD;  Location: [facility];  Service: Cardiovascular;  Laterality: N/A;   TEE WITHOUT CARDIOVERSION N/A 07/01/2022   Procedure: TRANSESOPHAGEAL ECHOCARDIOGRAM;  Surgeon: Antonieta Iba, MD;  Location: [facility];  Service: Cardiovascular;  Laterality: N/A;   WRIST SURGERY  1988   Social History:  reports that he has been smoking cigarettes. He has a 30 pack-year smoking history. He has never used smokeless tobacco. He reports that he does not currently use alcohol. He reports that he does not currently use drugs after having used the following drugs: Marijuana.  No Known Allergies  Family History  Problem Relation Age of Onset   Arthritis Mother        Rheumatoid   Diabetes Mother    Hyperlipidemia Mother    Diabetes Father    Cancer Maternal Grandfather        Prostate   Parkinson's disease Maternal Grandfather     Prior to Admission medications   Medication Sig Start Date End Date Taking? Authorizing Provider  Accu-Chek Softclix Lancets lancets Use to check blood sugar three times a day for type 2 diabetes E11.9 03/22/22   [name], NP  acetaminophen (TYLENOL) 500 MG tablet Take 2 tablets (1,000 mg total) by mouth every 6 (six) hours as needed. 01/25/22 01/25/23  Varney Daily, PA  apixaban (ELIQUIS) 5 MG TABS tablet Take 1 tablet (5 mg total) by mouth 2 (two) times daily. 07/14/22   [name], NP  aspirin EC 81 MG tablet Take 1 tablet (81 mg total) by mouth daily. 02/10/22   [name], NP  glucose blood  (ACCU-CHEK GUIDE) test strip Use to check blood sugar three times a day for type 2 diabetes E11.9 03/22/22   [name], NP  insulin glargine (LANTUS SOLOSTAR) 100 UNIT/ML Solostar Pen Inject 16 Units into the skin daily. 05/10/22 08/08/22  [name], NP  losartan (COZAAR) 25 MG tablet Take 1 tablet (25 mg total) by mouth daily. 05/10/22   [name], NP  metFORMIN (GLUCOPHAGE) 1000 MG tablet Take 1 tablet (1,000 mg total) by mouth 2 (two) times daily with a meal. TAKE 1 TABLET(1000 MG) BY MOUTH TWICE DAILY WITH A MEAL 07/14/22   [name], Salvadore Oxford, NP  OZEMPIC, 0.25 OR 0.5 MG/DOSE, 2 MG/3ML SOPN Inject 0.5 mg into the skin once a week. Start with 0.25mg  weekly x 4 weeks then increase to 0.5mg  weekly injection. Patient taking differently: Inject 0.5 mg into the skin once a week. 04/07/22   [name], NP  rosuvastatin (CRESTOR) 20 MG tablet Take 1 tablet (20 mg total) by  mouth daily. 07/14/22   [name], NP  sildenafil (VIAGRA) 50 MG tablet TAKE 1 TABLET(50 MG) BY MOUTH DAILY AS NEEDED FOR ERECTILE DYSFUNCTION 08/04/22   [name], NP    Physical Exam: Vitals:   09/12/22 1900 09/12/22 2028 09/12/22 2028 09/12/22 2030  BP: 139/89 (!) 148/92  138/79  Pulse: 96 (!) 107  (!) 102  Resp: 14 16  20   Temp:   97.6 F (36.4 C)   TempSrc:   Oral   SpO2: 96% 98%  98%  Weight:      Height:       Physical Exam Vitals and nursing note reviewed.  Constitutional:      General: He is not in acute distress.    Appearance: He is normal weight. He is not toxic-appearing.  HENT:     Head: Normocephalic and atraumatic.     Mouth/Throat:     Mouth: Mucous membranes are moist.     Pharynx: Oropharynx is clear.  Eyes:     Conjunctiva/sclera: Conjunctivae normal.     Pupils: Pupils are equal, round, and reactive to light.  Cardiovascular:     Rate and Rhythm: Regular rhythm. Tachycardia present.     Heart sounds: No murmur heard.    No gallop.  Pulmonary:     Effort: Pulmonary  effort is normal. No respiratory distress.     Breath sounds: Normal breath sounds. No wheezing or rales.  Abdominal:     General: Bowel sounds are normal. There is no distension.     Palpations: Abdomen is soft.     Tenderness: There is no abdominal tenderness. There is no guarding.  Musculoskeletal:     Right lower leg: No edema.     Left lower leg: No edema.  Skin:    General: Skin is warm and dry.     Comments: Previous amputation site of right second toe appears to have opened up and appears dusky.  No fluctuance noted to suggest abscess.  No purulent drainage noted.  Patient's foot remains warm to the touch, however DP pulses cannot be palpated and prolonged capillary refill is noted  Neurological:     Mental Status: He is alert.  Psychiatric:        Mood and Affect: Mood normal. Affect is flat.        Behavior: Behavior is cooperative.    Data Reviewed: CBC with WBC of 15.5, hemoglobin of 17.1, and platelets of 238 CMP with sodium of 134, potassium 3.9, chloride 97, bicarb 27, glucose 288, creatinine 1.01, AST 11, ALT 10, alkaline phosphatase 128 and GFR above 60  EKG still pending  Arterial lower extremity duplex pending  Results are pending, will review when available.  Assessment and Plan:  * Acute lower limb ischemia Patient is presenting with increased right foot pain with findings suggestive of limb ischemia.  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  I suspect this is due to patient's self discontinuation of Eliquis.  Does not appear acutely infected  - Vascular surgery consulted; appreciate their recommendations - Heparin infusion per pharmacy dosing - N.p.o. after midnight - Angiography scheduled for tomorrow - Frequent neurovascular checks - Oxycodone as needed for pain control  Diabetic foot ulcer  associated with type 2 diabetes mellitus (HCC) Patient has a history of a diabetic foot ulcer involving the second right toe  that has been since amputated.  There remains an ulcer at his amputation site, however there is no evidence of purulent drainage, fluctuance or profound erythema to suggest infection.  Likely due to poor wound healing.  - Revascularization as noted above  Recurrent strokes (HCC) Patient has a history of recurrent CVA secondary to heterozygous prothrombin gene 20210A mutation.  He has been on chronic Eliquis, but self discontinued 3 months ago.  He is uncertain why he discontinued it but after discussion, he is agreeable to taking it without interruption.  - Heparin infusion per pharmacy dosing given limb ischemia as noted above - Restart Eliquis prior to discharge  Uncontrolled type 2 diabetes mellitus with hyperglycemia, with long-term current use of insulin (HCC) - Hold home antiglycemic agents - SSI, moderate - Semglee 8 units at bedtime  CAD S/P percutaneous coronary angioplasty Patient denies any chest pain at this time.  He has not been taking his home Eliquis, however as noted above, plan is to restart prior to discharge.  - Continue home aspirin and statin  HTN (hypertension) - Resume home antihypertensive regimen  Advance Care Planning:   Code Status: Full Code verified by patient  Consults: Vascular surgery  Family Communication: No family at bedside  Severity of Illness: The appropriate patient status for this patient is INPATIENT. Inpatient status is judged to be reasonable and necessary in order to provide the required intensity of service to ensure the patient's safety. The patient's presenting symptoms, physical exam findings, and initial radiographic and laboratory data in the context of their chronic comorbidities is felt to place them at high risk for further clinical deterioration. Furthermore, it is not anticipated that the patient will be  medically stable for discharge from the hospital within 2 midnights of admission.   * I certify that at the point of admission it is my clinical judgment that the patient will require inpatient hospital care spanning beyond 2 midnights from the point of admission due to high intensity of service, high risk for further deterioration and high frequency of surveillance required.*  Author: Verdene Lennert, MD 09/12/2022 9:08 PM  For on call review [email or web address].

## 2022-09-12 NOTE — Assessment & Plan Note (Signed)
-   Hold home antiglycemic agents - SSI, moderate - Semglee 8 units at bedtime

## 2022-09-12 NOTE — ED Notes (Signed)
First Nurse Note: Patient sent to ED via POV form wound care center. Recently had 2nd toe, right foot amputated in May- been healing good until today. Cap refill in right foot between 15-20 secs per wound care. Hx of vascular problems

## 2022-09-12 NOTE — ED Notes (Addendum)
Provider at bedside. Pt [id number], able to answer all questions.

## 2022-09-12 NOTE — Assessment & Plan Note (Signed)
Patient has a history of a diabetic foot ulcer involving the second right toe that has been since amputated.  There remains an ulcer at his amputation site, however there is no evidence of purulent drainage, fluctuance or profound erythema to suggest infection.  Likely due to poor wound healing.  - Revascularization as noted above

## 2022-09-12 NOTE — ED Notes (Signed)
Provided pt with sandwich tray and diet shasta as requested.

## 2022-09-13 ENCOUNTER — Encounter
Admission: EM | Disposition: A | Discharge status: Skilled Nursing Facility | Payer: Self-pay | Source: Ambulatory Visit | Attending: Osteopathic Medicine

## 2022-09-13 ENCOUNTER — Inpatient Hospital Stay: Payer: Medicaid Other

## 2022-09-13 ENCOUNTER — Other Ambulatory Visit (HOSPITAL_COMMUNITY): Payer: Self-pay

## 2022-09-13 DIAGNOSIS — I700 Atherosclerosis of aorta: Secondary | ICD-10-CM | POA: Diagnosis not present

## 2022-09-13 DIAGNOSIS — I70221 Atherosclerosis of native arteries of extremities with rest pain, right leg: Secondary | ICD-10-CM | POA: Diagnosis not present

## 2022-09-13 DIAGNOSIS — Z91128 Patient's intentional underdosing of medication regimen for other reason: Secondary | ICD-10-CM | POA: Diagnosis not present

## 2022-09-13 DIAGNOSIS — T82868A Thrombosis of vascular prosthetic devices, implants and grafts, initial encounter: Secondary | ICD-10-CM

## 2022-09-13 DIAGNOSIS — Z79899 Other long term (current) drug therapy: Secondary | ICD-10-CM

## 2022-09-13 DIAGNOSIS — I998 Other disorder of circulatory system: Secondary | ICD-10-CM | POA: Diagnosis not present

## 2022-09-13 DIAGNOSIS — Z9889 Other specified postprocedural states: Secondary | ICD-10-CM

## 2022-09-13 DIAGNOSIS — Z7984 Long term (current) use of oral hypoglycemic drugs: Secondary | ICD-10-CM

## 2022-09-13 DIAGNOSIS — I7090 Unspecified atherosclerosis: Principal | ICD-10-CM

## 2022-09-13 DIAGNOSIS — Z89422 Acquired absence of other left toe(s): Secondary | ICD-10-CM

## 2022-09-13 DIAGNOSIS — Z95828 Presence of other vascular implants and grafts: Secondary | ICD-10-CM | POA: Diagnosis not present

## 2022-09-13 DIAGNOSIS — I743 Embolism and thrombosis of arteries of the lower extremities: Secondary | ICD-10-CM

## 2022-09-13 DIAGNOSIS — Z7982 Long term (current) use of aspirin: Secondary | ICD-10-CM

## 2022-09-13 DIAGNOSIS — Z7901 Long term (current) use of anticoagulants: Secondary | ICD-10-CM

## 2022-09-13 DIAGNOSIS — F17210 Nicotine dependence, cigarettes, uncomplicated: Secondary | ICD-10-CM

## 2022-09-13 HISTORY — PX: LOWER EXTREMITY ANGIOGRAPHY: CATH118251

## 2022-09-13 LAB — BASIC METABOLIC PANEL
Anion gap: 10 (ref 5–15)
BUN: 7 mg/dL (ref 6–20)
CO2: 24 mmol/L (ref 22–32)
Calcium: 8.6 mg/dL — ABNORMAL LOW (ref 8.9–10.3)
Chloride: 102 mmol/L (ref 98–111)
Creatinine, Ser: 0.75 mg/dL (ref 0.61–1.24)
GFR, Estimated: >60 mL/min (ref >60)
Glucose, Bld: 106 mg/dL — ABNORMAL HIGH (ref 70–99)
Potassium: 3.4 mmol/L — ABNORMAL LOW (ref 3.5–5.1)
Sodium: 136 mmol/L (ref 135–145)

## 2022-09-13 LAB — CBC
HCT: 42.5 % (ref 39.0–52.0)
Hemoglobin: 15.4 g/dL (ref 13.0–17.0)
MCH: 31.5 pg (ref 26.0–34.0)
MCHC: 36.2 g/dL — ABNORMAL HIGH (ref 30.0–36.0)
MCV: 86.9 fL (ref 80.0–100.0)
Platelets: 185 10*3/uL (ref 150–400)
RBC: 4.89 MIL/uL (ref 4.22–5.81)
RDW: 13.8 % (ref 11.5–15.5)
WBC: 12 10*3/uL — ABNORMAL HIGH (ref 4.0–10.5)
nRBC: 0 % (ref 0.0–0.2)

## 2022-09-13 LAB — HEPARIN LEVEL (UNFRACTIONATED)
Heparin Unfractionated: 0.35 IU/mL (ref 0.30–0.70)
Heparin Unfractionated: 0.31 IU/mL (ref 0.30–0.70)

## 2022-09-13 LAB — GLUCOSE, CAPILLARY
Glucose-Capillary: 112 mg/dL — ABNORMAL HIGH (ref 70–99)
Glucose-Capillary: 101 mg/dL — ABNORMAL HIGH (ref 70–99)
Glucose-Capillary: 96 mg/dL (ref 70–99)
Glucose-Capillary: 97 mg/dL (ref 70–99)
Glucose-Capillary: 226 mg/dL — ABNORMAL HIGH (ref 70–99)

## 2022-09-13 LAB — HIV ANTIBODY (ROUTINE TESTING W REFLEX): HIV Screen 4th Generation wRfx: NONREACTIVE

## 2022-09-13 LAB — MRSA NEXT GEN BY PCR, NASAL: MRSA by PCR Next Gen: NOT DETECTED

## 2022-09-13 SURGERY — LOWER EXTREMITY ANGIOGRAPHY
Anesthesia: Moderate Sedation | Laterality: Right

## 2022-09-13 MED ORDER — TIROFIBAN HCL IV 12.5 MG/250 ML
INTRAVENOUS | Status: AC
  Administered 2022-09-13: 0.15 ug/kg/min via INTRAVENOUS
  Filled 2022-09-13: qty 250

## 2022-09-13 MED ORDER — HEPARIN BOLUS VIA INFUSION
2600.0000 [IU] | Freq: Once | INTRAVENOUS | Status: AC
  Administered 2022-09-13: 2600 [IU] via INTRAVENOUS
  Filled 2022-09-13: qty 2600

## 2022-09-13 MED ORDER — FENTANYL CITRATE (PF) 100 MCG/2ML IJ SOLN: 12.5000 ug | Freq: Once | INTRAMUSCULAR | Status: DC | PRN

## 2022-09-13 MED ORDER — OXYCODONE HCL 5 MG PO TABS: 5.0000 mg | ORAL_TABLET | ORAL | Status: DC | PRN

## 2022-09-13 MED ORDER — TIROFIBAN (AGGRASTAT) BOLUS VIA INFUSION
25.0000 ug/kg | Freq: Once | INTRAVENOUS | Status: AC
  Administered 2022-09-13: 2155 ug via INTRAVENOUS
  Filled 2022-09-13: qty 44

## 2022-09-13 MED ORDER — ONDANSETRON HCL 4 MG/2ML IJ SOLN
INTRAMUSCULAR | Status: DC | PRN
  Administered 2022-09-13: 4 mg via INTRAVENOUS

## 2022-09-13 MED ORDER — NITROGLYCERIN 1 MG/10 ML FOR IR/CATH LAB
INTRA_ARTERIAL | Status: AC
  Filled 2022-09-13: qty 10

## 2022-09-13 MED ORDER — POTASSIUM CHLORIDE CRYS ER 20 MEQ PO TBCR
20.0000 meq | EXTENDED_RELEASE_TABLET | Freq: Once | ORAL | Status: AC
  Administered 2022-09-13: 20 meq via ORAL
  Filled 2022-09-13: qty 1

## 2022-09-13 MED ORDER — LABETALOL HCL 5 MG/ML IV SOLN: 10.0000 mg | INTRAVENOUS | Status: DC | PRN

## 2022-09-13 MED ORDER — TIROFIBAN HCL IN NACL 5-0.9 MG/100ML-% IV SOLN
0.1500 ug/kg/min | INTRAVENOUS | Status: DC
  Administered 2022-09-13 – 2022-09-14 (×2): 0.15 ug/kg/min via INTRAVENOUS
  Filled 2022-09-13 (×2): qty 100

## 2022-09-13 MED ORDER — CEFAZOLIN SODIUM-DEXTROSE 2-4 GM/100ML-% IV SOLN
2.0000 g | INTRAVENOUS | Status: AC
  Administered 2022-09-13: 2 g via INTRAVENOUS

## 2022-09-13 MED ORDER — MORPHINE SULFATE (PF) 4 MG/ML IV SOLN: 2.0000 mg | INTRAVENOUS | Status: DC | PRN

## 2022-09-13 MED ORDER — ALTEPLASE 2 MG IJ SOLR
INTRAMUSCULAR | Status: AC
  Filled 2022-09-13: qty 12

## 2022-09-13 MED ORDER — TIROFIBAN HCL IN NACL 5-0.9 MG/100ML-% IV SOLN: 0.1500 ug/kg/min | INTRAVENOUS | Status: DC

## 2022-09-13 MED ORDER — SODIUM CHLORIDE 0.9% FLUSH
3.0000 mL | Freq: Two times a day (BID) | INTRAVENOUS | Status: DC
  Administered 2022-09-13 – 2022-09-14 (×2): 3 mL via INTRAVENOUS

## 2022-09-13 MED ORDER — NITROGLYCERIN 1 MG/10 ML FOR IR/CATH LAB
INTRA_ARTERIAL | Status: DC | PRN
  Administered 2022-09-13: 200 ug via INTRA_ARTERIAL
  Administered 2022-09-13: 300 ug via INTRA_ARTERIAL
  Administered 2022-09-13: 400 ug via INTRA_ARTERIAL

## 2022-09-13 MED ORDER — FENTANYL CITRATE (PF) 100 MCG/2ML IJ SOLN
INTRAMUSCULAR | Status: AC
  Filled 2022-09-13: qty 2

## 2022-09-13 MED ORDER — SODIUM CHLORIDE 0.9% FLUSH: 3.0000 mL | INTRAVENOUS | Status: DC | PRN

## 2022-09-13 MED ORDER — HYDROMORPHONE HCL 1 MG/ML IJ SOLN: 1.0000 mg | Freq: Once | INTRAMUSCULAR | Status: DC | PRN

## 2022-09-13 MED ORDER — ATORVASTATIN CALCIUM 10 MG PO TABS
10.0000 mg | ORAL_TABLET | Freq: Every day | ORAL | Status: DC
  Administered 2022-09-13: 10 mg via ORAL
  Filled 2022-09-13 (×3): qty 1

## 2022-09-13 MED ORDER — MORPHINE SULFATE (PF) 2 MG/ML IV SOLN: 1.0000 mg | INTRAVENOUS | Status: DC | PRN

## 2022-09-13 MED ORDER — ACETAMINOPHEN 325 MG PO TABS: 650.0000 mg | ORAL_TABLET | ORAL | Status: DC | PRN

## 2022-09-13 MED ORDER — CEFAZOLIN SODIUM-DEXTROSE 2-4 GM/100ML-% IV SOLN
INTRAVENOUS | Status: AC
  Filled 2022-09-13: qty 100

## 2022-09-13 MED ORDER — FAMOTIDINE 20 MG PO TABS: 40.0000 mg | ORAL_TABLET | Freq: Once | ORAL | Status: DC | PRN

## 2022-09-13 MED ORDER — ONDANSETRON HCL 4 MG/2ML IJ SOLN: 4.0000 mg | Freq: Four times a day (QID) | INTRAMUSCULAR | Status: DC | PRN

## 2022-09-13 MED ORDER — ALTEPLASE 1 MG/ML SYRINGE FOR VASCULAR PROCEDURE
INTRAMUSCULAR | Status: DC | PRN
  Administered 2022-09-13: 12 mg via INTRA_ARTERIAL

## 2022-09-13 MED ORDER — HEPARIN SODIUM (PORCINE) 1000 UNIT/ML IJ SOLN
INTRAMUSCULAR | Status: AC
  Filled 2022-09-13: qty 10

## 2022-09-13 MED ORDER — NICOTINE 14 MG/24HR TD PT24
14.0000 mg | MEDICATED_PATCH | Freq: Every day | TRANSDERMAL | Status: DC
  Administered 2022-09-13 – 2022-09-14 (×2): 14 mg via TRANSDERMAL
  Filled 2022-09-13 (×2): qty 1

## 2022-09-13 MED ORDER — SODIUM CHLORIDE 0.9 % IV SOLN: 250.0000 mL | INTRAVENOUS | Status: DC | PRN

## 2022-09-13 MED ORDER — IODIXANOL 320 MG/ML IV SOLN
INTRAVENOUS | Status: DC | PRN
  Administered 2022-09-13: 95 mL via INTRA_ARTERIAL

## 2022-09-13 MED ORDER — DIPHENHYDRAMINE HCL 50 MG/ML IJ SOLN: 50.0000 mg | Freq: Once | INTRAMUSCULAR | Status: DC | PRN

## 2022-09-13 MED ORDER — FENTANYL CITRATE (PF) 100 MCG/2ML IJ SOLN
INTRAMUSCULAR | Status: DC | PRN
  Administered 2022-09-13: 25 ug via INTRAVENOUS
  Administered 2022-09-13: 12.5 ug via INTRAVENOUS
  Administered 2022-09-13: 50 ug via INTRAVENOUS

## 2022-09-13 MED ORDER — METHYLPREDNISOLONE SODIUM SUCC 125 MG IJ SOLR: 125.0000 mg | Freq: Once | INTRAMUSCULAR | Status: DC | PRN

## 2022-09-13 MED ORDER — MIDAZOLAM HCL 2 MG/2ML IJ SOLN
INTRAMUSCULAR | Status: DC | PRN
  Administered 2022-09-13 (×2): 1 mg via INTRAVENOUS
  Administered 2022-09-13: 2 mg via INTRAVENOUS

## 2022-09-13 MED ORDER — HYDRALAZINE HCL 20 MG/ML IJ SOLN: 5.0000 mg | INTRAMUSCULAR | Status: DC | PRN

## 2022-09-13 MED ORDER — MIDAZOLAM HCL 2 MG/ML PO SYRP: 8.0000 mg | ORAL_SOLUTION | Freq: Once | ORAL | Status: DC | PRN

## 2022-09-13 MED ORDER — ONDANSETRON HCL 4 MG/2ML IJ SOLN
INTRAMUSCULAR | Status: AC
  Filled 2022-09-13: qty 2

## 2022-09-13 MED ORDER — TIROFIBAN (AGGRASTAT) BOLUS VIA INFUSION: 25.0000 ug/kg | Freq: Once | INTRAVENOUS | Status: DC

## 2022-09-13 MED ORDER — HEPARIN (PORCINE) IN NACL 2000-0.9 UNIT/L-% IV SOLN
INTRAVENOUS | Status: DC | PRN
  Administered 2022-09-13: 1000 mL

## 2022-09-13 MED ORDER — HEPARIN SODIUM (PORCINE) 1000 UNIT/ML IJ SOLN
INTRAMUSCULAR | Status: DC | PRN
  Administered 2022-09-13: 6000 [IU] via INTRAVENOUS
  Administered 2022-09-13: 2000 [IU] via INTRAVENOUS

## 2022-09-13 MED ORDER — LIDOCAINE-EPINEPHRINE (PF) 1 %-1:200000 IJ SOLN
INTRAMUSCULAR | Status: DC | PRN
  Administered 2022-09-13: 20 mL

## 2022-09-13 MED ORDER — SODIUM CHLORIDE 0.9 % IV SOLN: INTRAVENOUS | Status: DC

## 2022-09-13 MED ORDER — MIDAZOLAM HCL 5 MG/5ML IJ SOLN
INTRAMUSCULAR | Status: AC
  Filled 2022-09-13: qty 5

## 2022-09-13 MED ORDER — SODIUM CHLORIDE 0.9 % IV SOLN: INTRAVENOUS | Status: AC

## 2022-09-13 SURGICAL SUPPLY — 29 items
BALLN LUTONIX 018 5X220X130 (BALLOONS) ×1
BALLN LUTONIX 018 5X40X130 (BALLOONS) ×1
BALLOON LUTONIX 018 5X220X130 (BALLOONS) IMPLANT
BALLOON LUTONIX 018 5X40X130 (BALLOONS) IMPLANT
CANISTER PENUMBRA ENGINE (MISCELLANEOUS) IMPLANT
CATH ANGIO 5F PIGTAIL 65CM (CATHETERS) IMPLANT
CATH INDIGO CAT6 KIT (CATHETERS) IMPLANT
CATH INFUS 135CMX30CM (CATHETERS) IMPLANT
CATH ROTAREX 135 6FR (CATHETERS) IMPLANT
CATH SEEKER .035X150CM (CATHETERS) IMPLANT
CATH VERT 5X100 (CATHETERS) IMPLANT
COVER EZ STRL 42X30 (DRAPES) IMPLANT
COVER PROBE ULTRASOUND 5X96 (MISCELLANEOUS) IMPLANT
DEVICE STARCLOSE SE CLOSURE (Vascular Products) IMPLANT
GLIDEWIRE ADV .035X260CM (WIRE) IMPLANT
KIT ENCORE 26 ADVANTAGE (KITS) IMPLANT
NDL ENTRY 21GA 7CM ECHOTIP (NEEDLE) IMPLANT
NEEDLE ENTRY 21GA 7CM ECHOTIP (NEEDLE) ×1 IMPLANT
PACK ANGIOGRAPHY (CUSTOM PROCEDURE TRAY) IMPLANT
SET INTRO CAPELLA COAXIAL (SET/KITS/TRAYS/PACK) IMPLANT
SHEATH BRITE TIP 5FRX11 (SHEATH) IMPLANT
SHEATH PINNACLE MP 6F 45CM (SHEATH) IMPLANT
SHEATH RAABE 6FR (SHEATH) IMPLANT
STENT LIFESTENT 6X170X130 (Permanent Stent) IMPLANT
STENT VIABAHN 6X25X120 (Permanent Stent) IMPLANT
SYR MEDRAD MARK 7 150ML (SYRINGE) IMPLANT
TUBING CONTRAST HIGH PRESS 72 (TUBING) IMPLANT
WIRE G V18X300CM (WIRE) IMPLANT
WIRE GUIDERIGHT .035X150 (WIRE) IMPLANT

## 2022-09-13 NOTE — H&P (View-Only) (Signed)
Hospital Consult    Reason for Consult:  Right lower extremity ischemia with pain.  Requesting Physician:  Dr. Imagene Gurney MD MRN #:  [id number]  History of Present Illness: This is a 42 y.o. male  Martin Mullen is a 42 y.o. 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 Patient does NOT endorse taking his anticoagulation medication.   On exam the patients right lower extremity is cool to touch and appears dusky most notably along the incision line of the prior second toe amputation. Patient endorses pain when ambulating that is relieved some by sitting or lying down. He endorses this starting about a week ago progressively getting worse. Patient currently on a heparin infusion. No other complaints overnight. Vitals all remain stable.   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   Chicken pox    Coronary artery disease    Inferior ST elevation myocardial infarction in July of 2014. Cardiac catheterization showed 95% distal RCA stenosis and 90% first diagonal stenosis with normal ejection fraction. He underwent PCI in bare-metal stent placement to the distal RCA.   Diabetes mellitus without complication (HCC)    Hypercholesterolemia    Stroke [facility])    Tobacco use     Past Surgical History:  Procedure Laterality Date   AMPUTATION TOE Right 06/27/2022   Procedure: AMPUTATION TOE RIGHT SECOND TOE;  Surgeon: Candelaria Stagers, DPM;  Location: [facility] ORS;  Service: Podiatry;  Laterality: Right;   CORONARY ANGIOPLASTY WITH STENT PLACEMENT     DEBRIDEMENT AND CLOSURE WOUND N/A 09/03/2019    Procedure: Debridement and closure of scrotal wound;  Surgeon: Allena Napoleon, MD;  Location: [facility];  Service: Plastics;  Laterality: N/A;   DEBRIDEMENT AND CLOSURE WOUND N/A 07/25/2019   Procedure: DEBRIDEMENT AND CLOSURE WOUND;  Surgeon: Allena Napoleon, MD;  Location: [facility] ORS;  Service: Plastics;  Laterality: N/A;   INCISION AND DRAINAGE Right 06/27/2022   Procedure: INCISION AND DRAINAGE;  Surgeon: Candelaria Stagers, DPM;  Location: [facility] ORS;  Service: Podiatry;  Laterality: Right;   INCISION AND DRAINAGE ABSCESS N/A 07/20/2019   Procedure: INCISION AND DRAINAGE ABSCESS;  Surgeon: Crista Elliot, MD;  Location: [facility] ORS;  Service: Urology;  Laterality: N/A;   INCISION AND DRAINAGE OF WOUND Right 06/30/2022   Procedure: IRRIGATION AND DEBRIDEMENT WOUND WITH CLOSURE;  Surgeon: Candelaria Stagers, DPM;  Location: [facility];  Service: Podiatry;  Laterality: Right;   LEFT HEART CATHETERIZATION WITH CORONARY ANGIOGRAM N/A 08/28/2012   Procedure: LEFT HEART CATHETERIZATION WITH CORONARY ANGIOGRAM;  Surgeon: Pamella Pert, MD;  Location: [facility] CATH LAB;  Service: Cardiovascular;  Laterality: N/A;   LOWER EXTREMITY ANGIOGRAPHY Right 06/29/2022   Procedure: Lower Extremity Angiography;  Surgeon: Annice Needy, MD;  Location: [facility] INVASIVE CV LAB;  Service: Cardiovascular;  Laterality: Right;   TEE WITHOUT CARDIOVERSION N/A 09/23/2020   Procedure: TRANSESOPHAGEAL ECHOCARDIOGRAM (TEE);  Surgeon: Lamar Blinks, MD;  Location: [facility];  Service: Cardiovascular;  Laterality: N/A;   TEE WITHOUT CARDIOVERSION N/A 07/01/2022   Procedure: TRANSESOPHAGEAL ECHOCARDIOGRAM;  Surgeon: Antonieta Iba, MD;  Location: [facility]  ORS;  Service: Cardiovascular;  Laterality: N/A;   WRIST SURGERY  1988    No Known Allergies  Prior to Admission medications   Medication Sig Start Date End Date Taking? Authorizing Provider  Accu-Chek Softclix Lancets lancets Use to check blood sugar three times a day for type 2  diabetes E11.9 03/22/22   [name], NP  acetaminophen (TYLENOL) 500 MG tablet Take 2 tablets (1,000 mg total) by mouth every 6 (six) hours as needed. 01/25/22 01/25/23  Varney Daily, PA  apixaban (ELIQUIS) 5 MG TABS tablet Take 1 tablet (5 mg total) by mouth 2 (two) times daily. 07/14/22   [name], NP  aspirin EC 81 MG tablet Take 1 tablet (81 mg total) by mouth daily. 02/10/22   [name], NP  glucose blood (ACCU-CHEK GUIDE) test strip Use to check blood sugar three times a day for type 2 diabetes E11.9 03/22/22   [name], NP  insulin glargine (LANTUS SOLOSTAR) 100 UNIT/ML Solostar Pen Inject 16 Units into the skin daily. 05/10/22 08/08/22  [name], NP  insulin glargine-yfgn (SEMGLEE) 100 UNIT/ML Pen Inject 12 Units into the skin daily. 02/10/22   [provider]  losartan (COZAAR) 25 MG tablet Take 1 tablet (25 mg total) by mouth daily. 05/10/22   [name], NP  metFORMIN (GLUCOPHAGE) 1000 MG tablet Take 1 tablet (1,000 mg total) by mouth 2 (two) times daily with a meal. TAKE 1 TABLET(1000 MG) BY MOUTH TWICE DAILY WITH A MEAL 07/14/22   [name], Salvadore Oxford, NP  OZEMPIC, 0.25 OR 0.5 MG/DOSE, 2 MG/3ML SOPN Inject 0.5 mg into the skin once a week. Start with 0.25mg  weekly x 4 weeks then increase to 0.5mg  weekly injection. Patient taking differently: Inject 0.5 mg into the skin once a week. 04/07/22   [name], NP  rosuvastatin (CRESTOR) 20 MG tablet Take 1 tablet (20 mg total) by mouth daily. 07/14/22   [name], NP  sildenafil (VIAGRA) 50 MG tablet TAKE 1 TABLET(50 MG) BY MOUTH DAILY AS NEEDED FOR ERECTILE DYSFUNCTION 08/04/22   [name], NP    Social History   Socioeconomic History   Marital status: Divorced    Spouse name: Not on file   Number of children: 2   Years of education: Not on file   Highest education level: Not on file  Occupational History   Not on file  Tobacco Use   Smoking status: Every Day    Current  packs/day: 1.00    Average packs/day: 1 pack/day for 30.0 years (30.0 ttl pk-yrs)    Types: Cigarettes   Smokeless tobacco: Never  Vaping Use   Vaping status: Never Used  Substance and Sexual Activity   Alcohol use: Not Currently    Alcohol/week: 0.0 standard drinks of alcohol    Comment: occasional   Drug use: Not Currently    Types: Marijuana   Sexual activity: Yes    Birth control/protection: None  Other Topics Concern   Not on file  Social History Narrative   Lives with girlfriend and her mother    Social Determinants of Health   Financial Resource Strain: Low Risk  (06/10/2022)   Overall Financial Resource Strain (CARDIA)    Difficulty of Paying Living Expenses: Not very hard  Food Insecurity: No Food Insecurity (06/29/2022)   Hunger Vital Sign    Worried About Running Out of Food in the Last Year: Never true    Ran Out  of Food in the Last Year: Never true  Transportation Needs: No Transportation Needs (06/29/2022)   PRAPARE - Administrator, Civil Service (Medical): No    Lack of Transportation (Non-Medical): No  Physical Activity: Not on file  Stress: Not on file  Social Connections: Not on file  Intimate Partner Violence: Not At Risk (06/29/2022)   Humiliation, Afraid, Rape, and Kick questionnaire    Fear of Current or Ex-Partner: No    Emotionally Abused: No    Physically Abused: No    Sexually Abused: No     Family History  Problem Relation Age of Onset   Arthritis Mother        Rheumatoid   Diabetes Mother    Hyperlipidemia Mother    Diabetes Father    Cancer Maternal Grandfather        Prostate   Parkinson's disease Maternal Grandfather     ROS: Otherwise negative unless mentioned in HPI  Physical Examination  Vitals:   09/13/22 0830 09/13/22 1222  BP: 105/69 (!) 149/88  Pulse: 76 79  Resp: 16   Temp: 98.4 F (36.9 C) 98.5 F (36.9 C)  SpO2: 98% 96%   Body mass index is 26.5 kg/m.  General:  WDWN in NAD Gait: Not  observed HENT: WNL, normocephalic Pulmonary: normal non-labored breathing, without Rales, rhonchi,  wheezing Cardiac: regular, without  Murmurs, rubs or gallops; without carotid bruits Abdomen: Positive bowel sounds, soft, NT/ND, no masses Skin: without rashes Vascular Exam/Pulses: Unable to palpate right lower extremity DP/PT. Left lower extremity with palpable pulses.  Extremities: with ischemic changes, without Gangrene , with cellulitis; with open wounds; No drainage noted. Musculoskeletal: no muscle wasting or atrophy  Neurologic: A&O X 3;  No focal weakness or paresthesias are detected; speech is fluent/normal Psychiatric:  The pt has Normal affect. Post CVA so he responds slowly with a somewhat slurred speech which is his normal.  Lymph:  Unremarkable  CBC    Component Value Date/Time   WBC 12.0 (H) 09/13/2022 0647   RBC 4.89 09/13/2022 0647   HGB 15.4 09/13/2022 0647   HGB 17.9 (H) 08/11/2021 0937   HCT 42.5 09/13/2022 0647   HCT 51.0 08/11/2021 0937   PLT 185 09/13/2022 0647   PLT 261 08/11/2021 0937   MCV 86.9 09/13/2022 0647   MCV 93 08/11/2021 0937   MCV 93 01/29/2013 0040   MCH 31.5 09/13/2022 0647   MCHC 36.2 (H) 09/13/2022 0647   RDW 13.8 09/13/2022 0647   RDW 12.4 08/11/2021 0937   RDW 13.8 01/29/2013 0040   LYMPHSABS 2.9 07/20/2022 1627   LYMPHSABS 4.8 (H) 08/11/2021 0937   MONOABS 0.5 07/20/2022 1627   EOSABS 0.2 07/20/2022 1627   EOSABS 0.5 (H) 08/11/2021 0937   BASOSABS 0.1 07/20/2022 1627   BASOSABS 0.1 08/11/2021 0937    BMET    Component Value Date/Time   NA 136 09/13/2022 0647   NA 140 08/11/2021 0937   NA 139 01/29/2013 0040   K 3.4 (L) 09/13/2022 0647   K 4.0 01/29/2013 0040   CL 102 09/13/2022 0647   CL 107 01/29/2013 0040   CO2 24 09/13/2022 0647   CO2 28 01/29/2013 0040   GLUCOSE 106 (H) 09/13/2022 0647   GLUCOSE 228 (H) 01/29/2013 0040   BUN 7 09/13/2022 0647   BUN 11 08/11/2021 0937   BUN 15 01/29/2013 0040   CREATININE 0.75  09/13/2022 0647   CREATININE 0.82 08/11/2022 1455   CALCIUM 8.6 (L)  09/13/2022 0647   CALCIUM 9.2 01/29/2013 0040   GFRNONAA >60 09/13/2022 0647   GFRNONAA >60 01/29/2013 0040   GFRAA 104 11/13/2019 1550   GFRAA >60 01/29/2013 0040    COAGS: Lab Results  Component Value Date   INR 1.2 06/28/2022   INR 1.3 (H) 06/27/2022   INR 1.0 09/11/2021     Non-Invasive Vascular Imaging:   EXAM:09/13/22 RIGHT LOWER EXTREMITY VENOUS DOPPLER ULTRASOUND   TECHNIQUE: Gray-scale sonography with compression, as well as color and duplex ultrasound, were performed to evaluate the deep venous system(s) from the level of the common femoral vein through the popliteal and proximal calf veins.   COMPARISON:  06/26/2022   FINDINGS: VENOUS   Normal compressibility of the common femoral, superficial femoral, and popliteal veins, as well as the visualized calf veins. Visualized portions of profunda femoral vein and great saphenous vein unremarkable. No filling defects to suggest DVT on grayscale or color Doppler imaging. Doppler waveforms show normal direction of venous flow, normal respiratory plasticity and response to augmentation.   Limited views of the contralateral common femoral vein are unremarkable.   OTHER   None.   Limitations: none   IMPRESSION: Negative.  EXAM:09/13/22 RIGHT LOWER EXTREMITY ARTERIAL DUPLEX SCAN   TECHNIQUE: Gray-scale sonography as well as color Doppler and duplex ultrasound was performed to evaluate the lower extremity arteries including the common, superficial and profunda femoral arteries, popliteal artery and calf arteries.   COMPARISON:  None Available.   FINDINGS: Right lower Extremity   Inflow: Normal common femoral arterial waveforms and velocities. No evidence of inflow (aortoiliac) disease.   Outflow: The profundus femoral and proximal and mid SFA demonstrate normal triphasic waveforms and velocities. The distal right SFA demonstrates  severe atheromatous plaque, with monophasic waveforms with diminished velocities measuring 21.5 centimeters/second. A stent is identified within the proximal popliteal artery, which is occluded. There is minimal flow within the mid to distal right popliteal artery, with monophasic waveforms and diminished velocities.   Runoff: The posterior tibial artery is identified through the level of the ankle, with monophasic waveform noted. The anterior tibial and dorsalis pedis arteries are not identified.   IMPRESSION: 1. Severe atheromatous plaque of the distal right SFA, with monophasic waveforms and diminished velocities. 2. Occlusion of a right popliteal artery stent. 3. Patent posterior tibial artery to the level of the ankle. The anterior tibial and dorsalis pedis arteries are not identified. Four given ultrasound findings, further evaluation with angiography may be useful.  Statin:  Yes.   Beta Blocker:  Yes.   Aspirin:  Yes.   ACEI:  No. ARB:  No. CCB use:  No Other antiplatelets/anticoagulants:  Yes.   Eliquis 5mg  Twice daily.    ASSESSMENT/PLAN: This is a 42 y.o. male who presents to [facility] ER for right lower extremity pain with ischemia.   PLAN: Vascular Surgery plans on taking the patient to the vascular lab today 09/13/2022 for Aortogram with right lower extremity angiogram with possible intervention. I discussed in detail with the patient the procedure, benefits, risks and complications. He verbalizes his understanding and wishes to proceed. I answered all his questions today. Patient has been NPO since midnight last night.    -I discussed the plan in detail with Dr Vilinda Flake and he agrees with the plan.    Marcie Bal [facility] 09/13/2022 12:42 PM

## 2022-09-13 NOTE — Consult Note (Signed)
Hospital Consult    Reason for Consult:  Right lower extremity ischemia with pain.  Requesting Physician:  Dr. Imagene Gurney MD MRN #:  [id number]  History of Present Illness: This is a 42 y.o. male  Martin Mullen is a 42 y.o. 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 Patient does NOT endorse taking his anticoagulation medication.   On exam the patients right lower extremity is cool to touch and appears dusky most notably along the incision line of the prior second toe amputation. Patient endorses pain when ambulating that is relieved some by sitting or lying down. He endorses this starting about a week ago progressively getting worse. Patient currently on a heparin infusion. No other complaints overnight. Vitals all remain stable.   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   Chicken pox    Coronary artery disease    Inferior ST elevation myocardial infarction in July of 2014. Cardiac catheterization showed 95% distal RCA stenosis and 90% first diagonal stenosis with normal ejection fraction. He underwent PCI in bare-metal stent placement to the distal RCA.   Diabetes mellitus without complication (HCC)    Hypercholesterolemia    Stroke [facility])    Tobacco use     Past Surgical History:  Procedure Laterality Date   AMPUTATION TOE Right 06/27/2022   Procedure: AMPUTATION TOE RIGHT SECOND TOE;  Surgeon: Candelaria Stagers, DPM;  Location: [facility] ORS;  Service: Podiatry;  Laterality: Right;   CORONARY ANGIOPLASTY WITH STENT PLACEMENT     DEBRIDEMENT AND CLOSURE WOUND N/A 09/03/2019    Procedure: Debridement and closure of scrotal wound;  Surgeon: Allena Napoleon, MD;  Location: [facility];  Service: Plastics;  Laterality: N/A;   DEBRIDEMENT AND CLOSURE WOUND N/A 07/25/2019   Procedure: DEBRIDEMENT AND CLOSURE WOUND;  Surgeon: Allena Napoleon, MD;  Location: [facility] ORS;  Service: Plastics;  Laterality: N/A;   INCISION AND DRAINAGE Right 06/27/2022   Procedure: INCISION AND DRAINAGE;  Surgeon: Candelaria Stagers, DPM;  Location: [facility] ORS;  Service: Podiatry;  Laterality: Right;   INCISION AND DRAINAGE ABSCESS N/A 07/20/2019   Procedure: INCISION AND DRAINAGE ABSCESS;  Surgeon: Crista Elliot, MD;  Location: [facility] ORS;  Service: Urology;  Laterality: N/A;   INCISION AND DRAINAGE OF WOUND Right 06/30/2022   Procedure: IRRIGATION AND DEBRIDEMENT WOUND WITH CLOSURE;  Surgeon: Candelaria Stagers, DPM;  Location: [facility] ORS;  Service: Podiatry;  Laterality: Right;   LEFT HEART CATHETERIZATION WITH CORONARY ANGIOGRAM N/A 08/28/2012   Procedure: LEFT HEART CATHETERIZATION WITH CORONARY ANGIOGRAM;  Surgeon: Pamella Pert, MD;  Location: [facility] CATH LAB;  Service: Cardiovascular;  Laterality: N/A;   LOWER EXTREMITY ANGIOGRAPHY Right 06/29/2022   Procedure: Lower Extremity Angiography;  Surgeon: Annice Needy, MD;  Location: [facility] INVASIVE CV LAB;  Service: Cardiovascular;  Laterality: Right;   TEE WITHOUT CARDIOVERSION N/A 09/23/2020   Procedure: TRANSESOPHAGEAL ECHOCARDIOGRAM (TEE);  Surgeon: Lamar Blinks, MD;  Location: [facility] ORS;  Service: Cardiovascular;  Laterality: N/A;   TEE WITHOUT CARDIOVERSION N/A 07/01/2022   Procedure: TRANSESOPHAGEAL ECHOCARDIOGRAM;  Surgeon: Antonieta Iba, MD;  Location: [facility]  ORS;  Service: Cardiovascular;  Laterality: N/A;   WRIST SURGERY  1988    No Known Allergies  Prior to Admission medications   Medication Sig Start Date End Date Taking? Authorizing Provider  Accu-Chek Softclix Lancets lancets Use to check blood sugar three times a day for type 2  diabetes E11.9 03/22/22   [name], NP  acetaminophen (TYLENOL) 500 MG tablet Take 2 tablets (1,000 mg total) by mouth every 6 (six) hours as needed. 01/25/22 01/25/23  Varney Daily, PA  apixaban (ELIQUIS) 5 MG TABS tablet Take 1 tablet (5 mg total) by mouth 2 (two) times daily. 07/14/22   [name], NP  aspirin EC 81 MG tablet Take 1 tablet (81 mg total) by mouth daily. 02/10/22   [name], NP  glucose blood (ACCU-CHEK GUIDE) test strip Use to check blood sugar three times a day for type 2 diabetes E11.9 03/22/22   [name], NP  insulin glargine (LANTUS SOLOSTAR) 100 UNIT/ML Solostar Pen Inject 16 Units into the skin daily. 05/10/22 08/08/22  [name], NP  insulin glargine-yfgn (SEMGLEE) 100 UNIT/ML Pen Inject 12 Units into the skin daily. 02/10/22   [provider]  losartan (COZAAR) 25 MG tablet Take 1 tablet (25 mg total) by mouth daily. 05/10/22   [name], NP  metFORMIN (GLUCOPHAGE) 1000 MG tablet Take 1 tablet (1,000 mg total) by mouth 2 (two) times daily with a meal. TAKE 1 TABLET(1000 MG) BY MOUTH TWICE DAILY WITH A MEAL 07/14/22   [name], Salvadore Oxford, NP  OZEMPIC, 0.25 OR 0.5 MG/DOSE, 2 MG/3ML SOPN Inject 0.5 mg into the skin once a week. Start with 0.25mg  weekly x 4 weeks then increase to 0.5mg  weekly injection. Patient taking differently: Inject 0.5 mg into the skin once a week. 04/07/22   [name], NP  rosuvastatin (CRESTOR) 20 MG tablet Take 1 tablet (20 mg total) by mouth daily. 07/14/22   [name], NP  sildenafil (VIAGRA) 50 MG tablet TAKE 1 TABLET(50 MG) BY MOUTH DAILY AS NEEDED FOR ERECTILE DYSFUNCTION 08/04/22   [name], NP    Social History   Socioeconomic History   Marital status: Divorced    Spouse name: Not on file   Number of children: 2   Years of education: Not on file   Highest education level: Not on file  Occupational History   Not on file  Tobacco Use   Smoking status: Every Day    Current  packs/day: 1.00    Average packs/day: 1 pack/day for 30.0 years (30.0 ttl pk-yrs)    Types: Cigarettes   Smokeless tobacco: Never  Vaping Use   Vaping status: Never Used  Substance and Sexual Activity   Alcohol use: Not Currently    Alcohol/week: 0.0 standard drinks of alcohol    Comment: occasional   Drug use: Not Currently    Types: Marijuana   Sexual activity: Yes    Birth control/protection: None  Other Topics Concern   Not on file  Social History Narrative   Lives with girlfriend and her mother    Social Determinants of Health   Financial Resource Strain: Low Risk  (06/10/2022)   Overall Financial Resource Strain (CARDIA)    Difficulty of Paying Living Expenses: Not very hard  Food Insecurity: No Food Insecurity (06/29/2022)   Hunger Vital Sign    Worried About Running Out of Food in the Last Year: Never true    Ran Out  of Food in the Last Year: Never true  Transportation Needs: No Transportation Needs (06/29/2022)   PRAPARE - Administrator, Civil Service (Medical): No    Lack of Transportation (Non-Medical): No  Physical Activity: Not on file  Stress: Not on file  Social Connections: Not on file  Intimate Partner Violence: Not At Risk (06/29/2022)   Humiliation, Afraid, Rape, and Kick questionnaire    Fear of Current or Ex-Partner: No    Emotionally Abused: No    Physically Abused: No    Sexually Abused: No     Family History  Problem Relation Age of Onset   Arthritis Mother        Rheumatoid   Diabetes Mother    Hyperlipidemia Mother    Diabetes Father    Cancer Maternal Grandfather        Prostate   Parkinson's disease Maternal Grandfather     ROS: Otherwise negative unless mentioned in HPI  Physical Examination  Vitals:   09/13/22 0830 09/13/22 1222  BP: 105/69 (!) 149/88  Pulse: 76 79  Resp: 16   Temp: 98.4 F (36.9 C) 98.5 F (36.9 C)  SpO2: 98% 96%   Body mass index is 26.5 kg/m.  General:  WDWN in NAD Gait: Not  observed HENT: WNL, normocephalic Pulmonary: normal non-labored breathing, without Rales, rhonchi,  wheezing Cardiac: regular, without  Murmurs, rubs or gallops; without carotid bruits Abdomen: Positive bowel sounds, soft, NT/ND, no masses Skin: without rashes Vascular Exam/Pulses: Unable to palpate right lower extremity DP/PT. Left lower extremity with palpable pulses.  Extremities: with ischemic changes, without Gangrene , with cellulitis; with open wounds; No drainage noted. Musculoskeletal: no muscle wasting or atrophy  Neurologic: A&O X 3;  No focal weakness or paresthesias are detected; speech is fluent/normal Psychiatric:  The pt has Normal affect. Post CVA so he responds slowly with a somewhat slurred speech which is his normal.  Lymph:  Unremarkable  CBC    Component Value Date/Time   WBC 12.0 (H) 09/13/2022 0647   RBC 4.89 09/13/2022 0647   HGB 15.4 09/13/2022 0647   HGB 17.9 (H) 08/11/2021 0937   HCT 42.5 09/13/2022 0647   HCT 51.0 08/11/2021 0937   PLT 185 09/13/2022 0647   PLT 261 08/11/2021 0937   MCV 86.9 09/13/2022 0647   MCV 93 08/11/2021 0937   MCV 93 01/29/2013 0040   MCH 31.5 09/13/2022 0647   MCHC 36.2 (H) 09/13/2022 0647   RDW 13.8 09/13/2022 0647   RDW 12.4 08/11/2021 0937   RDW 13.8 01/29/2013 0040   LYMPHSABS 2.9 07/20/2022 1627   LYMPHSABS 4.8 (H) 08/11/2021 0937   MONOABS 0.5 07/20/2022 1627   EOSABS 0.2 07/20/2022 1627   EOSABS 0.5 (H) 08/11/2021 0937   BASOSABS 0.1 07/20/2022 1627   BASOSABS 0.1 08/11/2021 0937    BMET    Component Value Date/Time   NA 136 09/13/2022 0647   NA 140 08/11/2021 0937   NA 139 01/29/2013 0040   K 3.4 (L) 09/13/2022 0647   K 4.0 01/29/2013 0040   CL 102 09/13/2022 0647   CL 107 01/29/2013 0040   CO2 24 09/13/2022 0647   CO2 28 01/29/2013 0040   GLUCOSE 106 (H) 09/13/2022 0647   GLUCOSE 228 (H) 01/29/2013 0040   BUN 7 09/13/2022 0647   BUN 11 08/11/2021 0937   BUN 15 01/29/2013 0040   CREATININE 0.75  09/13/2022 0647   CREATININE 0.82 08/11/2022 1455   CALCIUM 8.6 (L)  09/13/2022 0647   CALCIUM 9.2 01/29/2013 0040   GFRNONAA >60 09/13/2022 0647   GFRNONAA >60 01/29/2013 0040   GFRAA 104 11/13/2019 1550   GFRAA >60 01/29/2013 0040    COAGS: Lab Results  Component Value Date   INR 1.2 06/28/2022   INR 1.3 (H) 06/27/2022   INR 1.0 09/11/2021     Non-Invasive Vascular Imaging:   EXAM:09/13/22 RIGHT LOWER EXTREMITY VENOUS DOPPLER ULTRASOUND   TECHNIQUE: Gray-scale sonography with compression, as well as color and duplex ultrasound, were performed to evaluate the deep venous system(s) from the level of the common femoral vein through the popliteal and proximal calf veins.   COMPARISON:  06/26/2022   FINDINGS: VENOUS   Normal compressibility of the common femoral, superficial femoral, and popliteal veins, as well as the visualized calf veins. Visualized portions of profunda femoral vein and great saphenous vein unremarkable. No filling defects to suggest DVT on grayscale or color Doppler imaging. Doppler waveforms show normal direction of venous flow, normal respiratory plasticity and response to augmentation.   Limited views of the contralateral common femoral vein are unremarkable.   OTHER   None.   Limitations: none   IMPRESSION: Negative.  EXAM:09/13/22 RIGHT LOWER EXTREMITY ARTERIAL DUPLEX SCAN   TECHNIQUE: Gray-scale sonography as well as color Doppler and duplex ultrasound was performed to evaluate the lower extremity arteries including the common, superficial and profunda femoral arteries, popliteal artery and calf arteries.   COMPARISON:  None Available.   FINDINGS: Right lower Extremity   Inflow: Normal common femoral arterial waveforms and velocities. No evidence of inflow (aortoiliac) disease.   Outflow: The profundus femoral and proximal and mid SFA demonstrate normal triphasic waveforms and velocities. The distal right SFA demonstrates  severe atheromatous plaque, with monophasic waveforms with diminished velocities measuring 21.5 centimeters/second. A stent is identified within the proximal popliteal artery, which is occluded. There is minimal flow within the mid to distal right popliteal artery, with monophasic waveforms and diminished velocities.   Runoff: The posterior tibial artery is identified through the level of the ankle, with monophasic waveform noted. The anterior tibial and dorsalis pedis arteries are not identified.   IMPRESSION: 1. Severe atheromatous plaque of the distal right SFA, with monophasic waveforms and diminished velocities. 2. Occlusion of a right popliteal artery stent. 3. Patent posterior tibial artery to the level of the ankle. The anterior tibial and dorsalis pedis arteries are not identified. Four given ultrasound findings, further evaluation with angiography may be useful.  Statin:  Yes.   Beta Blocker:  Yes.   Aspirin:  Yes.   ACEI:  No. ARB:  No. CCB use:  No Other antiplatelets/anticoagulants:  Yes.   Eliquis 5mg  Twice daily.    ASSESSMENT/PLAN: This is a 42 y.o. male who presents to [facility] ER for right lower extremity pain with ischemia.   PLAN: Vascular Surgery plans on taking the patient to the vascular lab today 09/13/2022 for Aortogram with right lower extremity angiogram with possible intervention. I discussed in detail with the patient the procedure, benefits, risks and complications. He verbalizes his understanding and wishes to proceed. I answered all his questions today. Patient has been NPO since midnight last night.    -I discussed the plan in detail with Dr Vilinda Flake and he agrees with the plan.    Marcie Bal [facility] 09/13/2022 12:42 PM

## 2022-09-13 NOTE — Op Note (Signed)
[facility]  Percutaneous Study/Intervention Procedural Note   Date of Surgery: 09/13/2022  Surgeon:  Renford Dills, MD.  Pre-operative Diagnosis:   Atherosclerotic occlusive disease bilateral lower extremities with rest pain of the right foot. Ischemia right lower extremity with thrombosis of previously placed stent  Post-operative diagnosis:  Same  Procedure(s) Performed:             1.  Introduction catheter into right lower extremity 3rd order catheter placement              2.    Contrast injection right lower extremity for distal runoff             3.  Percutaneous transluminal angioplasty and stent placement right superficial femoral artery and right popliteal artery.             4.  Atherectomy right SFA and popliteal at the previous stent site             5.  Thrombectomy with the penumbra CAT 6 thrombectomy catheter right distal popliteal and right posterior tibial artery.             6.  Infusion of 12 mg of tPA right popliteal and posterior tibial artery.               7.   StarClose device left common femoral               Anesthesia: Conscious sedation was administered under my direct supervision by the interventional radiology RN. IV Versed plus fentanyl were utilized. Continuous ECG, pulse oximetry and blood pressure was monitored throughout the entire procedure.  Conscious sedation was for a total of 142 minutes.  Sheath: 6 French destination sheath left common femoral retrograde  Contrast: 95 cc  Fluoroscopy Time: 13.5 minutes  Indications:  Martin Mullen presents with ischemia of the right lower extremity.  He is status post percutaneous transluminal angioplasty and stent placement of the right SFA approximately 2 months ago.  He decided to stop his antiplatelet therapy and presented to the emergency room with several days of increasing pain in his right foot.  Duplex ultrasound demonstrated occlusion of the stent.  Angiography with  intervention is recommended for limb salvage.  The risks and benefits are reviewed all questions answered patient agrees to proceed.  Procedure:  Martin Mullen is a 42 y.o. y.o. male who was identified and appropriate procedural time out was performed.  The patient was then placed supine on the table and prepped and draped in the usual sterile fashion.    Ultrasound was placed in the sterile sleeve and the left groin was evaluated the left common femoral artery was echolucent and pulsatile indicating patency.  Image was recorded for the permanent record and under real-time visualization a microneedle was inserted into the common femoral artery microwire followed by a micro-sheath.  A J-wire was then advanced through the micro-sheath and a  5 [place] sheath was then inserted over a J-wire. J-wire was then advanced and a 5 French pigtail catheter was positioned at the level of T12. AP projection of the aorta was then obtained. Pigtail catheter was repositioned to above the bifurcation and a LAO view of the pelvis was obtained.  Subsequently a pigtail catheter with the stiff angle Glidewire was used to cross the aortic bifurcation the catheter wire were advanced down into the right distal external iliac artery. Oblique view of the femoral bifurcation was then obtained and  subsequently the wire was reintroduced and the pigtail catheter negotiated into the SFA representing third order catheter placement. Distal runoff was then performed.  7000 units of heparin was then given (later in the case an additional 3000 units was given) and allowed to circulate and initially a 6 [place] Ansell sheath was advanced up and over the bifurcation and positioned in the proximal SFA.  Later in the case when the CAT 6 was required for thrombectomy the sheath was exchanged to a 6 [place] destination.  KMP  catheter and stiff angle Glidewire were then negotiated down into the distal popliteal.  Distal runoff was then completed by  hand injection through the catheter.  After review these images the V18 0.018 wire was advanced down into the posterior tibial and a [place] thromboatherectomy catheter was prepped on the field and then used 2 treat the occluded stent.  2 separate passes were made from the mid SFA down to the mid popliteal.  Follow-up imaging demonstrated restoration of flow but there were multiple greater than 50% lesions noted.  I selected a 6 mm x 170 mm life stent and deployed this from just below the distal edge of the existing stent proximally.  This was then postdilated with a 5 mm x 220 mm Lutonix drug-eluting balloon inflated to 8 atm for 1 minute.  Follow-up imaging now demonstrated very delayed flow but contrast-enhancement through the stent demonstrated was completely expanded there was less than 10% residual stenosis.  I suspected propagation of thrombus distally.  Nitroglycerin was given intra-arterially.  A total of 1000 mcg and 3 separate doses was given throughout the case.  At this point I elected to try tPA to reestablish flow.  A infusion catheter with a 30 cm infusion length was positioned down into the posterior tibial 12 mg of Cathflo was reconstituted and then injected and allowed to dwell for 30 minutes.  Follow-up imaging demonstrated some flow there still appeared to be thrombus at the distal end of the previously placed stent and a 6 mm x 2.5 mm Viabahn was used to extend the stent and postdilated with a 5 mm balloon.  Unfortunately after this there was no longer any flow.  It was at this point the sheath was exchanged for a destination sheath and the penumbra CAT 6 device was used to treat the distal popliteal this was a separate lesion compared to the previously placed stents in the distalmost portion of the popliteal as well as the tibioperoneal trunk and posterior tibial.  After multiple maneuvers extending down into the distal posterior tibial flow was reestablished.  There appears to be some  residual thrombus in the peroneal as well as in the distalmost aspect of the posterior tibial and therefore the patient was started on Aggrastat.  After review of these images the sheath is pulled into the left external iliac oblique of the common femoral is obtained and a Star close device deployed. There no immediate complications.   Findings:  The abdominal aorta is opacified with a bolus injection contrast. Renal arteries are are very poorly visualized secondary to his breathing and large amount of gas. The aorta itself has diffuse disease but no hemodynamically significant lesions. The common and external iliac arteries are widely patent bilaterally.  The right common femoral is widely patent as is the profunda femoris.  The SFA does indeed occluded in its midportion well above the previously placed stent and the occlusion continues into the above-knee popliteal down to the level of  the tibial plateau plateau.  The distal popliteal demonstrates minimal disease but the trifurcation is heavily diseased with occlusion of the anterior tibial just past its origin tibioperoneal trunk demonstrates diffuse disease and there is greater than 80% stenosis at the origin of the peroneal.  The posterior tibial demonstrates some diffuse disease but it does not appear to be hemodynamically significant the posterior tibial is the dominant vessel to the foot.    Following atherectomy there is reestablishment of flow through the SFA and popliteal but there are multiple areas of greater than 50% residual stenosis and therefore a life stent is placed and postdilated to 5 mm.  Unfortunately at this point we began encountering increasing amounts of thrombus mostly in the distal popliteal well beyond the area that we have intervened on as well as in the tibioperoneal trunk and posterior tibial.  tPA was infused and ultimately a penumbra CAT 6 was utilized to reestablish flow in the distal popliteal tibioperoneal trunk and  posterior tibial.                        Disposition: Patient was taken to the recovery room in stable condition having tolerated the procedure well.  Martin Mullen, Latina Craver 09/13/2022,5:01 PM

## 2022-09-13 NOTE — Plan of Care (Signed)

## 2022-09-13 NOTE — Progress Notes (Signed)
ANTICOAGULATION CONSULT NOTE - Initial Consult  Pharmacy Consult for heparin Indication:  arterial occlusion of RLE, hx PAD  No Known Allergies  Patient Measurements: Height: 5\' 11"  (180.3 cm) Weight: 86.2 kg (190 lb 0.6 oz) IBW/kg (Calculated) : 75.3 Heparin Dosing Weight: 86.2 kg  Vital Signs: Temp: 97.8 F (36.6 C) (07/30 1337) Temp Source: Oral (07/30 1337) BP: 137/104 (07/30 1337) Pulse Rate: 85 (07/30 1337)  Labs: Recent Labs    09/12/22 1617 09/12/22 2326 09/13/22 0647 09/13/22 1242  HGB 17.1*  --  15.4  --   HCT 49.2  --  42.5  --   PLT 238  --  185  --   HEPARINUNFRC  --  0.12* 0.35 0.31  CREATININE 1.01  --  0.75  --     Estimated Creatinine Clearance: 128.1 mL/min (by C-G formula based on SCr of 0.75 mg/dL).   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   Chicken pox    Coronary artery disease    Inferior ST elevation myocardial infarction in July of 2014. Cardiac catheterization showed 95% distal RCA stenosis and 90% first diagonal stenosis with normal ejection fraction. He underwent PCI in bare-metal stent placement to the distal RCA.   Diabetes mellitus without complication (HCC)    Hypercholesterolemia    Stroke (HCC)    Tobacco use    Assessment: 42 y/o male presenting from outpatient wound care center due to pain and cyanotic vasculature in his right leg. Pharmacy has been consulted to dose heparin. 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   Goal of Therapy:  Heparin level 0.3-0.7 units/ml Monitor platelets by anticoagulation protocol: Yes   Date/time HL  Comments 7/29@2326  HL 0.12 Subtherapeutic at 1400un/hr 7/30@0647  HL 0.35 Therapeutic x 1 at 1550un/hr 7/30@1242  HL 0.31 Therapeutic x2  Plan:  Continue heparin infusion at 1550 units/hr. Repeat  HL with AM labs. Check daily CBC and signs/symptoms of bleeding.  Keagan Anthis Rodriguez-Guzman PharmD, BCPS 09/13/2022 1:51 PM

## 2022-09-13 NOTE — Progress Notes (Signed)
ANTICOAGULATION CONSULT NOTE - Initial Consult  Pharmacy Consult for heparin Indication:  arterial occlusion of RLE, hx PAD  No Known Allergies  Patient Measurements: Height: 5\' 11"  (180.3 cm) Weight: 86.2 kg (190 lb 0.6 oz) IBW/kg (Calculated) : 75.3 Heparin Dosing Weight: 86.2 kg  Vital Signs: Temp: 98.5 F (36.9 C) (07/30 0004) Temp Source: Oral (07/29 2028) BP: 108/70 (07/30 0004) Pulse Rate: 98 (07/30 0004)  Labs: Recent Labs    09/12/22 1617 09/12/22 2326  HGB 17.1*  --   HCT 49.2  --   PLT 238  --   HEPARINUNFRC  --  0.12*  CREATININE 1.01  --     Estimated Creatinine Clearance: 101.5 mL/min (by C-G formula based on SCr of 1.01 mg/dL).   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   Chicken pox    Coronary artery disease    Inferior ST elevation myocardial infarction in July of 2014. Cardiac catheterization showed 95% distal RCA stenosis and 90% first diagonal stenosis with normal ejection fraction. He underwent PCI in bare-metal stent placement to the distal RCA.   Diabetes mellitus without complication (HCC)    Hypercholesterolemia    Stroke (HCC)    Tobacco use    Assessment: 42 y/o male presenting from outpatient wound care center due to pain and cyanotic vasculature in his right leg. Pharmacy has been consulted to dose heparin. 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   Goal of Therapy:  Heparin level 0.3-0.7 units/ml Monitor platelets by anticoagulation protocol: Yes   Plan:  7/29:  HL @ 2326 = 0.12, SUBtherapeutic - RN states pt lost IV access at around 2311, this would not account for HL being that low so will take this result as valid - Will order heparin 2600 units IV X 1 bolus and increase drip rate to 1550 units/hr. - Will recheck HL 6 hrs after  rate change  Check daily CBC and signs/symptoms of bleeding  Thank you for involving pharmacy in this patient's care.   Braxley Balandran D Clinical Pharmacist 09/13/2022 12:40 AM

## 2022-09-13 NOTE — Progress Notes (Signed)
ANTICOAGULATION CONSULT NOTE - Initial Consult  Pharmacy Consult for heparin Indication:  arterial occlusion of RLE, hx PAD  No Known Allergies  Patient Measurements: Height: 5\' 11"  (180.3 cm) Weight: 86.2 kg (190 lb 0.6 oz) IBW/kg (Calculated) : 75.3 Heparin Dosing Weight: 86.2 kg  Vital Signs: Temp: 97.8 F (36.6 C) (07/30 1337) Temp Source: Oral (07/30 1337) BP: 108/78 (07/30 1655) Pulse Rate: 0 (07/30 1705)  Labs: Recent Labs    09/12/22 1617 09/12/22 2326 09/13/22 0647 09/13/22 1242  HGB 17.1*  --  15.4  --   HCT 49.2  --  42.5  --   PLT 238  --  185  --   HEPARINUNFRC  --  0.12* 0.35 0.31  CREATININE 1.01  --  0.75  --     Estimated Creatinine Clearance: 128.1 mL/min (by C-G formula based on SCr of 0.75 mg/dL).   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   Chicken pox    Coronary artery disease    Inferior ST elevation myocardial infarction in July of 2014. Cardiac catheterization showed 95% distal RCA stenosis and 90% first diagonal stenosis with normal ejection fraction. He underwent PCI in bare-metal stent placement to the distal RCA.   Diabetes mellitus without complication (HCC)    Hypercholesterolemia    Stroke (HCC)    Tobacco use    Assessment: 42 y/o male presenting from outpatient wound care center due to pain and cyanotic vasculature in his right leg. Pharmacy has been consulted to dose heparin. 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   Goal of Therapy:  Heparin level 0.3-0.7 units/ml Monitor platelets by anticoagulation protocol: Yes   Date/time HL  Comments 7/29@2326  HL 0.12 Subtherapeutic at 1400un/hr 7/30@0647  HL 0.35 Therapeutic x 1 at 1550un/hr 7/30@1242  HL 0.31 Therapeutic x2  Plan:  Post thrombectomy Dr. Gilda Crease wants Aggrastat infusion  until 0700 09/14/22, then resume heparin infusion. Aggrastat bolus of 2155 mg IV x 1 then 0.15 mcg/kg/min infusion (15.52 ml/hr) Resume heparin infusion at 1550 units/hr once Aggrastat is discontinued.  Barrie Folk, PharmD 09/13/2022 5:15 PM

## 2022-09-13 NOTE — Progress Notes (Signed)
ANTICOAGULATION CONSULT NOTE - Initial Consult  Pharmacy Consult for heparin Indication:  arterial occlusion of RLE, hx PAD  No Known Allergies  Patient Measurements: Height: 5\' 11"  (180.3 cm) Weight: 86.2 kg (190 lb 0.6 oz) IBW/kg (Calculated) : 75.3 Heparin Dosing Weight: 86.2 kg  Vital Signs: Temp: 98.8 F (37.1 C) (07/30 0344) Temp Source: Oral (07/29 2028) BP: 113/68 (07/30 0344) Pulse Rate: 85 (07/30 0344)  Labs: Recent Labs    09/12/22 1617 09/12/22 2326 09/13/22 0647  HGB 17.1*  --  15.4  HCT 49.2  --  42.5  PLT 238  --  185  HEPARINUNFRC  --  0.12* 0.35  CREATININE 1.01  --  0.75    Estimated Creatinine Clearance: 128.1 mL/min (by C-G formula based on SCr of 0.75 mg/dL).   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   Chicken pox    Coronary artery disease    Inferior ST elevation myocardial infarction in July of 2014. Cardiac catheterization showed 95% distal RCA stenosis and 90% first diagonal stenosis with normal ejection fraction. He underwent PCI in bare-metal stent placement to the distal RCA.   Diabetes mellitus without complication (HCC)    Hypercholesterolemia    Stroke (HCC)    Tobacco use    Assessment: 42 y/o male presenting from outpatient wound care center due to pain and cyanotic vasculature in his right leg. Pharmacy has been consulted to dose heparin. 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   Goal of Therapy:  Heparin level 0.3-0.7 units/ml Monitor platelets by anticoagulation protocol: Yes   Date/time HL  Comments 7/29@2326  HL 0.12 Subtherapeutic at 1400un/hr 7/30@0647  HL 0.35 Therapeutic x 1 at 1550un/hr  Plan:  HL therapeutic x 1, CBC stable Continue heparin infusion at 1550 units/hr. Repeat HL in 6 hrs to verify therapeutic  rate. Check daily CBC and signs/symptoms of bleeding.  Caetano Oberhaus Rodriguez-Guzman PharmD, BCPS 09/13/2022 8:18 AM

## 2022-09-13 NOTE — Progress Notes (Signed)
Transition of Care [facility]) - Inpatient Brief Assessment   Patient Details  Name: Martin Mullen MRN: [id number] Date of Birth: 11-21-1980  [facility] CM/SW Contact:    Truddie Hidden, RN Phone Number: 09/13/2022, 11:02 AM   Clinical Narrative: TOC assessing for ongoing needs and discharge planning.   Transition of Care Asessment: Insurance and Status: Insurance coverage has been reviewed Patient has primary care physician: Yes Home environment has been reviewed: Return to home Prior level of function:: Independent Prior/Current Home Services: No current home services Social Determinants of Health Reivew: SDOH reviewed no interventions necessary Readmission risk has been reviewed: Yes Transition of care needs: no transition of care needs at this time

## 2022-09-13 NOTE — Progress Notes (Signed)
PROGRESS NOTE   HPI was taken from Dr. Huel Cote: Martin Mullen is a 42 y.o. 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   Mr. Martin Mullen states that he noticed that his right foot was becoming more painful over the last 2 days, especially with standing.  He denies noticing any swelling in his legs though.  He has noticed some drainage, but states it did not look purulent.  He denies any fever, chills, nausea, vomiting, diarrhea, shortness of breath, chest pain, palpitations.  He notes that he has not been taking his Eliquis for approximately 3 months now.  When asked him why he discontinued, he stated he no longer wanted to take it but did not have any particular reason for that.   Per chart review, patient was seen at the wound care center today.  At the time, it was noted that his left second toe amputation site looks significantly worse with cyanosis.  This was noted to be a change compared to 2 weeks prior.  Due to this, he was instructed to go to the ED.   ED course: On arrival to the ED, patient was normotensive at 126/86 with heart rate of 115.  He was saturating at 98% on room air.  He was afebrile at 98.7.  Initial workup notable for WBC of 15.5, hemoglobin of 17.1, platelets of 238, sodium of 134, glucose of 288, creatinine 1.01 with GFR above 60.  Vascular surgery was consulted and patient started on IV heparin.  TRH contacted for admission.   JAMIAS DAMM  WUJ:[id number] DOB: 1980-04-14 DOA: 09/12/2022 PCP: [name], NP    Assessment & Plan:   Principal Problem:   Acute lower limb ischemia Active Problems:   Diabetic foot ulcer associated with type 2 diabetes mellitus (HCC)    Recurrent strokes (HCC)   Uncontrolled type 2 diabetes mellitus with hyperglycemia, with long-term current use of insulin (HCC)   HTN (hypertension)   CAD S/P percutaneous coronary angioplasty  Assessment and Plan: Acute lower limb ischemia: srterial duplex obtained that demonstrates severe atheromatous plaque of the distal right SFA in addition to occlusion of the right popliteal artery stent, although posterior tibial artery remains patent. Likely secondary to noncompliance w/ eliquis. Holding eliquis. Continue on IV heparin.Angiogram today as per vasc surg. Oxycodone, morphine prn for pain. Continue w/ neurovascular checks    Diabetic foot ulcer:  involving the second right toe that has been since amputated. Remains an ulcer at his amputation site, however there is no evidence of infection. Likely secondary to poor wound healing    Recurrent strokes: secondary to heterozygous prothrombin gene 20210A mutation. Has been on chronic Eliquis, but self discontinued 3 months ago. Holding eliquis. Continue on IV heparin    DM2: poorly controlled, HbA1c 7.7. Continue on glargine, SSI w/ accuchecks   Hx of CAD: s/p percutaneous coronary angioplasty. Has not been taking eliquis at home. Holding eliquis. Continue on IV heparin and aspirin, statin    HTN: continue on home dose of losartan    DVT prophylaxis: IV heparin  Code Status: full  Family Communication:  Disposition Plan: likely d/c back home   Level of care: Progressive Status is: Inpatient Remains inpatient appropriate because: severity of illness, requiring IV heparin  Consultants:  Vasc surg   Procedures:   Antimicrobials:  Subjective: Pt is groggy from anesthesia  Objective: Vitals:   09/12/22 2116 09/12/22 2130 09/13/22 0004 09/13/22 0344  BP: (!) 140/89  108/70 113/68  Pulse: 97  98 85  Resp: 18 14 18 18   Temp: 98.1 F (36.7 C)  98.5 F (36.9 C) 98.8 F (37.1 C)  TempSrc:      SpO2: 99%  96% 96%  Weight:       Height:        Intake/Output Summary (Last 24 hours) at 09/13/2022 0830 Last data filed at 09/12/2022 1900 Gross per 24 hour  Intake 1000 ml  Output --  Net 1000 ml   Filed Weights   09/12/22 1613 09/12/22 1616  Weight: 86.2 kg 86.2 kg    Examination:  General exam: Appears calm and comfortable  Respiratory system: clear breath sounds b/l Cardiovascular system: S1 & S2 +. No rubs, gallops or clicks. Gastrointestinal system: Abdomen is nondistended, soft and nontender. Hypoactive bowel sounds heard. Central nervous system: groggy.  Psychiatry: unable to assess as pt was groggy from anesthesia     Data Reviewed: I have personally reviewed following labs and imaging studies  CBC: Recent Labs  Lab 09/12/22 1617 09/13/22 0647  WBC 15.5* 12.0*  HGB 17.1* 15.4  HCT 49.2 42.5  MCV 90.3 86.9  PLT 238 185   Basic Metabolic Panel: Recent Labs  Lab 09/12/22 1617 09/13/22 0647  NA 134* 136  K 3.9 3.4*  CL 97* 102  CO2 27 24  GLUCOSE 288* 106*  BUN 10 7  CREATININE 1.01 0.75  CALCIUM 9.0 8.6*   GFR: Estimated Creatinine Clearance: 128.1 mL/min (by C-G formula based on SCr of 0.75 mg/dL). Liver Function Tests: Recent Labs  Lab 09/12/22 1617  AST 11*  ALT 10  ALKPHOS 128*  BILITOT 0.4  PROT 7.8  ALBUMIN 4.0   No results for input(s): "LIPASE", "AMYLASE" in the last 168 hours. No results for input(s): "AMMONIA" in the last 168 hours. Coagulation Profile: No results for input(s): "INR", "PROTIME" in the last 168 hours. Cardiac Enzymes: No results for input(s): "CKTOTAL", "CKMB", "CKMBINDEX", "TROPONINI" in the last 168 hours. BNP (last 3 results) No results for input(s): "PROBNP" in the last 8760 hours. HbA1C: No results for input(s): "HGBA1C" in the last 72 hours. CBG: Recent Labs  Lab 09/12/22 2124  GLUCAP 215*   Lipid Profile: No results for input(s): "CHOL", "HDL", "LDLCALC", "TRIG", "CHOLHDL", "LDLDIRECT" in the last 72 hours. Thyroid Function  Tests: No results for input(s): "TSH", "T4TOTAL", "FREET4", "T3FREE", "THYROIDAB" in the last 72 hours. Anemia Panel: No results for input(s): "VITAMINB12", "FOLATE", "FERRITIN", "TIBC", "IRON", "RETICCTPCT" in the last 72 hours. Sepsis Labs: No results for input(s): "PROCALCITON", "LATICACIDVEN" in the last 168 hours.  No results found for this or any previous visit (from the past 240 hour(s)).       Radiology Studies: [name] Lower Ext Art Right  Result Date: 09/12/2022 CLINICAL DATA:  Right lower extremity pain for 2 days EXAM: RIGHT LOWER EXTREMITY ARTERIAL DUPLEX SCAN TECHNIQUE: Gray-scale sonography as well as color Doppler and duplex ultrasound was performed to evaluate the lower extremity arteries including the common, superficial and profunda femoral arteries, popliteal artery and calf arteries. COMPARISON:  None Available. FINDINGS: Right lower Extremity Inflow: Normal common femoral arterial waveforms and velocities. No evidence of inflow (aortoiliac) disease. Outflow: The profundus femoral and proximal and mid SFA demonstrate normal triphasic waveforms and velocities. The distal  right SFA demonstrates severe atheromatous plaque, with monophasic waveforms with diminished velocities measuring 21.5 centimeters/second. A stent is identified within the proximal popliteal artery, which is occluded. There is minimal flow within the mid to distal right popliteal artery, with monophasic waveforms and diminished velocities. Runoff: The posterior tibial artery is identified through the level of the ankle, with monophasic waveform noted. The anterior tibial and dorsalis pedis arteries are not identified. IMPRESSION: 1. Severe atheromatous plaque of the distal right SFA, with monophasic waveforms and diminished velocities. 2. Occlusion of a right popliteal artery stent. 3. Patent posterior tibial artery to the level of the ankle. The anterior tibial and dorsalis pedis arteries are not identified. Four  given ultrasound findings, further evaluation with angiography may be useful. Electronically Signed   By: Sharlet Salina M.D.   On: 09/12/2022 20:23        Scheduled Meds:  aspirin EC  81 mg Oral Daily   insulin aspart  0-15 Units Subcutaneous TID WC   insulin glargine-yfgn  8 Units Subcutaneous QHS   losartan  25 mg Oral Daily   potassium chloride  20 mEq Oral Once   rosuvastatin  20 mg Oral Daily   sodium chloride flush  3 mL Intravenous Q12H   Continuous Infusions:  heparin 1,550 Units/hr (09/13/22 0534)     LOS: 1 day       Charise Killian, MD [facility] Pager [phone]  If 7PM-7AM, please contact night-coverage [email or web address] 09/13/2022, 8:30 AM

## 2022-09-14 ENCOUNTER — Encounter: Payer: Self-pay | Admitting: Vascular Surgery

## 2022-09-14 ENCOUNTER — Encounter
Admission: EM | Disposition: A | Discharge status: Skilled Nursing Facility | Payer: Self-pay | Source: Ambulatory Visit | Attending: Osteopathic Medicine

## 2022-09-14 ENCOUNTER — Other Ambulatory Visit: Payer: Self-pay | Admitting: Internal Medicine

## 2022-09-14 DIAGNOSIS — T82898A Other specified complication of vascular prosthetic devices, implants and grafts, initial encounter: Secondary | ICD-10-CM | POA: Diagnosis not present

## 2022-09-14 DIAGNOSIS — I70221 Atherosclerosis of native arteries of extremities with rest pain, right leg: Secondary | ICD-10-CM | POA: Diagnosis not present

## 2022-09-14 DIAGNOSIS — I743 Embolism and thrombosis of arteries of the lower extremities: Secondary | ICD-10-CM | POA: Diagnosis not present

## 2022-09-14 DIAGNOSIS — Z9889 Other specified postprocedural states: Secondary | ICD-10-CM | POA: Diagnosis not present

## 2022-09-14 DIAGNOSIS — E11621 Type 2 diabetes mellitus with foot ulcer: Secondary | ICD-10-CM | POA: Diagnosis not present

## 2022-09-14 DIAGNOSIS — I2510 Atherosclerotic heart disease of native coronary artery without angina pectoris: Secondary | ICD-10-CM | POA: Diagnosis not present

## 2022-09-14 DIAGNOSIS — I7090 Unspecified atherosclerosis: Secondary | ICD-10-CM | POA: Diagnosis not present

## 2022-09-14 DIAGNOSIS — I998 Other disorder of circulatory system: Secondary | ICD-10-CM | POA: Diagnosis not present

## 2022-09-14 HISTORY — PX: LOWER EXTREMITY ANGIOGRAPHY: CATH118251

## 2022-09-14 LAB — CBC
HCT: 43.2 % (ref 39.0–52.0)
HCT: 43.8 % (ref 39.0–52.0)
Hemoglobin: 15 g/dL (ref 13.0–17.0)
Hemoglobin: 15 g/dL (ref 13.0–17.0)
MCH: 31.3 pg (ref 26.0–34.0)
MCH: 30.9 pg (ref 26.0–34.0)
MCHC: 34.7 g/dL (ref 30.0–36.0)
MCHC: 34.2 g/dL (ref 30.0–36.0)
MCV: 90 fL (ref 80.0–100.0)
MCV: 90.1 fL (ref 80.0–100.0)
Platelets: 193 10*3/uL (ref 150–400)
Platelets: 179 10*3/uL (ref 150–400)
RBC: 4.8 MIL/uL (ref 4.22–5.81)
RBC: 4.86 MIL/uL (ref 4.22–5.81)
RDW: 13.7 % (ref 11.5–15.5)
RDW: 13.7 % (ref 11.5–15.5)
WBC: 10.1 10*3/uL (ref 4.0–10.5)
WBC: 14.2 10*3/uL — ABNORMAL HIGH (ref 4.0–10.5)
nRBC: 0 % (ref 0.0–0.2)
nRBC: 0 % (ref 0.0–0.2)

## 2022-09-14 LAB — GLUCOSE, CAPILLARY
Glucose-Capillary: 159 mg/dL — ABNORMAL HIGH (ref 70–99)
Glucose-Capillary: 129 mg/dL — ABNORMAL HIGH (ref 70–99)
Glucose-Capillary: 112 mg/dL — ABNORMAL HIGH (ref 70–99)
Glucose-Capillary: 105 mg/dL — ABNORMAL HIGH (ref 70–99)
Glucose-Capillary: 123 mg/dL — ABNORMAL HIGH (ref 70–99)

## 2022-09-14 LAB — FIBRINOGEN
Fibrinogen: 629 mg/dL — ABNORMAL HIGH (ref 210–475)
Fibrinogen: 658 mg/dL — ABNORMAL HIGH (ref 210–475)

## 2022-09-14 SURGERY — LOWER EXTREMITY ANGIOGRAPHY
Anesthesia: Moderate Sedation | Laterality: Right

## 2022-09-14 MED ORDER — ONDANSETRON HCL 4 MG/2ML IJ SOLN: 4.0000 mg | Freq: Four times a day (QID) | INTRAMUSCULAR | Status: DC | PRN

## 2022-09-14 MED ORDER — SODIUM CHLORIDE 0.9 % IV SOLN
0.5000 mg/h | INTRAVENOUS | Status: DC
  Filled 2022-09-14: qty 10

## 2022-09-14 MED ORDER — MIDAZOLAM HCL 5 MG/5ML IJ SOLN
INTRAMUSCULAR | Status: AC
  Filled 2022-09-14: qty 5

## 2022-09-14 MED ORDER — HEPARIN (PORCINE) 25000 UT/250ML-% IV SOLN
1550.0000 [IU]/h | INTRAVENOUS | Status: DC
  Administered 2022-09-14: 1550 [IU]/h via INTRAVENOUS
  Filled 2022-09-14: qty 250

## 2022-09-14 MED ORDER — METHYLPREDNISOLONE SODIUM SUCC 125 MG IJ SOLR: 125.0000 mg | Freq: Once | INTRAMUSCULAR | Status: DC | PRN

## 2022-09-14 MED ORDER — HEPARIN (PORCINE) 25000 UT/250ML-% IV SOLN: 600.0000 [IU]/h | INTRAVENOUS | Status: DC

## 2022-09-14 MED ORDER — ONDANSETRON HCL 4 MG/2ML IJ SOLN
4.0000 mg | Freq: Four times a day (QID) | INTRAMUSCULAR | Status: DC | PRN
  Administered 2022-09-16: 4 mg via INTRAVENOUS
  Filled 2022-09-14: qty 2

## 2022-09-14 MED ORDER — HYDROMORPHONE HCL 1 MG/ML IJ SOLN
1.0000 mg | INTRAMUSCULAR | Status: DC | PRN
  Administered 2022-09-14 – 2022-09-15 (×10): 1 mg via INTRAVENOUS
  Filled 2022-09-14 (×10): qty 1

## 2022-09-14 MED ORDER — SODIUM CHLORIDE 0.9% FLUSH
3.0000 mL | Freq: Two times a day (BID) | INTRAVENOUS | Status: DC
  Administered 2022-09-14: 15 mL via INTRAVENOUS
  Administered 2022-09-15 – 2022-09-28 (×25): 3 mL via INTRAVENOUS

## 2022-09-14 MED ORDER — HEPARIN SODIUM (PORCINE) 1000 UNIT/ML IJ SOLN
INTRAMUSCULAR | Status: DC | PRN
  Administered 2022-09-14: 5000 [IU] via INTRAVENOUS

## 2022-09-14 MED ORDER — SODIUM CHLORIDE 0.9 % IV SOLN
1.0000 mg/h | INTRAVENOUS | Status: DC
  Filled 2022-09-14: qty 10

## 2022-09-14 MED ORDER — IODIXANOL 320 MG/ML IV SOLN
INTRAVENOUS | Status: DC | PRN
  Administered 2022-09-14: 20 mL via INTRA_ARTERIAL

## 2022-09-14 MED ORDER — HEPARIN SODIUM (PORCINE) 1000 UNIT/ML IJ SOLN
INTRAMUSCULAR | Status: AC
  Filled 2022-09-14: qty 10

## 2022-09-14 MED ORDER — SODIUM CHLORIDE 0.9 % IV SOLN
250.0000 mL | INTRAVENOUS | Status: DC | PRN
  Administered 2022-09-17: 250 mL via INTRAVENOUS

## 2022-09-14 MED ORDER — FENTANYL CITRATE (PF) 100 MCG/2ML IJ SOLN
INTRAMUSCULAR | Status: DC | PRN
  Administered 2022-09-14: 25 ug via INTRAVENOUS
  Administered 2022-09-14: 50 ug via INTRAVENOUS

## 2022-09-14 MED ORDER — CEFAZOLIN SODIUM-DEXTROSE 2-4 GM/100ML-% IV SOLN
2.0000 g | INTRAVENOUS | Status: AC
  Administered 2022-09-14: 2 g via INTRAVENOUS

## 2022-09-14 MED ORDER — HEPARIN (PORCINE) 25000 UT/250ML-% IV SOLN
INTRAVENOUS | Status: AC
  Filled 2022-09-14: qty 250

## 2022-09-14 MED ORDER — SODIUM CHLORIDE 0.9% FLUSH: 3.0000 mL | INTRAVENOUS | Status: DC | PRN

## 2022-09-14 MED ORDER — HEPARIN (PORCINE) 25000 UT/250ML-% IV SOLN
600.0000 [IU]/h | INTRAVENOUS | Status: DC
  Administered 2022-09-14: 600 [IU]/h via INTRA_ARTERIAL

## 2022-09-14 MED ORDER — FENTANYL CITRATE (PF) 100 MCG/2ML IJ SOLN: 12.5000 ug | Freq: Once | INTRAMUSCULAR | Status: DC | PRN

## 2022-09-14 MED ORDER — FENTANYL CITRATE (PF) 100 MCG/2ML IJ SOLN
INTRAMUSCULAR | Status: AC
  Filled 2022-09-14: qty 2

## 2022-09-14 MED ORDER — HEPARIN (PORCINE) IN NACL 1000-0.9 UT/500ML-% IV SOLN
INTRAVENOUS | Status: DC | PRN
  Administered 2022-09-14: 1000 mL

## 2022-09-14 MED ORDER — CEFAZOLIN SODIUM-DEXTROSE 2-4 GM/100ML-% IV SOLN
INTRAVENOUS | Status: AC
  Filled 2022-09-14: qty 100

## 2022-09-14 MED ORDER — MIDAZOLAM HCL 2 MG/2ML IJ SOLN: 1.0000 mg | INTRAMUSCULAR | Status: DC | PRN

## 2022-09-14 MED ORDER — FAMOTIDINE 20 MG PO TABS: 40.0000 mg | ORAL_TABLET | Freq: Once | ORAL | Status: DC | PRN

## 2022-09-14 MED ORDER — DIPHENHYDRAMINE HCL 50 MG/ML IJ SOLN: 50.0000 mg | Freq: Once | INTRAMUSCULAR | Status: DC | PRN

## 2022-09-14 MED ORDER — SODIUM CHLORIDE 0.9 % IV SOLN
0.5000 mg/h | INTRAVENOUS | Status: DC
  Administered 2022-09-14 – 2022-09-15 (×2): 0.5 mg/h
  Filled 2022-09-14 (×3): qty 10

## 2022-09-14 MED ORDER — MIDAZOLAM HCL 2 MG/ML PO SYRP: 8.0000 mg | ORAL_SOLUTION | Freq: Once | ORAL | Status: DC | PRN

## 2022-09-14 MED ORDER — HYDROMORPHONE HCL 1 MG/ML IJ SOLN: 1.0000 mg | Freq: Once | INTRAMUSCULAR | Status: DC | PRN

## 2022-09-14 MED ORDER — LABETALOL HCL 5 MG/ML IV SOLN
10.0000 mg | INTRAVENOUS | Status: DC | PRN
  Administered 2022-09-14 – 2022-09-17 (×2): 10 mg via INTRAVENOUS
  Filled 2022-09-14 (×3): qty 4

## 2022-09-14 MED ORDER — SODIUM CHLORIDE 0.9 % IV SOLN: INTRAVENOUS | Status: DC

## 2022-09-14 MED ORDER — HYDRALAZINE HCL 20 MG/ML IJ SOLN: 5.0000 mg | INTRAMUSCULAR | Status: DC | PRN

## 2022-09-14 MED ORDER — MIDAZOLAM HCL 2 MG/2ML IJ SOLN
INTRAMUSCULAR | Status: DC | PRN
  Administered 2022-09-14: 1 mg via INTRAVENOUS
  Administered 2022-09-14: 2 mg via INTRAVENOUS

## 2022-09-14 MED ORDER — CHLORHEXIDINE GLUCONATE CLOTH 2 % EX PADS
6.0000 | MEDICATED_PAD | Freq: Every day | CUTANEOUS | Status: DC
  Administered 2022-09-14 – 2022-09-20 (×6): 6 via TOPICAL

## 2022-09-14 SURGICAL SUPPLY — 28 items
BALLN ULTRVRSE 7X40X130C (BALLOONS) ×1
BALLOON ULTRVRSE 7X40X130C (BALLOONS) IMPLANT
BIOPATCH RED 1 DISK 7.0 (GAUZE/BANDAGES/DRESSINGS) IMPLANT
CATH ANGIO 5F BER 100CM (CATHETERS) IMPLANT
CATH INFUS 135CMX50CM (CATHETERS) IMPLANT
CATH SEEKER .035X150CM (CATHETERS) IMPLANT
CATH TEMPO 5F RIM 65CM (CATHETERS) IMPLANT
COVER EZ STRL 42X30 (DRAPES) IMPLANT
COVER PROBE ULTRASOUND 5X96 (MISCELLANEOUS) IMPLANT
GLIDEWIRE ADV .035X260CM (WIRE) IMPLANT
GOWN STRL REUS W/ TWL LRG LVL3 (GOWN DISPOSABLE) ×2 IMPLANT
GOWN STRL REUS W/TWL LRG LVL3 (GOWN DISPOSABLE) ×1
KIT CV MULTILUMEN 7FR 20 (SET/KITS/TRAYS/PACK) ×1
KIT CV MULTILUMEN 7FR 20 SUB (SET/KITS/TRAYS/PACK) IMPLANT
KIT ENCORE 26 ADVANTAGE (KITS) IMPLANT
NDL ENTRY 21GA 7CM ECHOTIP (NEEDLE) IMPLANT
NEEDLE ENTRY 21GA 7CM ECHOTIP (NEEDLE) ×1 IMPLANT
PACK ANGIOGRAPHY (CUSTOM PROCEDURE TRAY) ×2 IMPLANT
SET INTRO CAPELLA COAXIAL (SET/KITS/TRAYS/PACK) IMPLANT
SHEATH BRITE TIP 5FRX11 (SHEATH) IMPLANT
SHEATH PINNACLE MP 6F 45CM (SHEATH) IMPLANT
SHEATH RAABE 7FR (SHEATH) IMPLANT
STENT VIABAHN 7X50X120 (Permanent Stent) ×1 IMPLANT
STENT VIABAHN 7X5X120 7FR (Permanent Stent) IMPLANT
SUT SILK 0 FSL (SUTURE) IMPLANT
WIRE G V18X300CM (WIRE) IMPLANT
WIRE GUIDERIGHT .035X150 (WIRE) IMPLANT
WIRE SUPRACORE 300CM (WIRE) IMPLANT

## 2022-09-14 NOTE — H&P (View-Only) (Signed)
Post Procedure Note:   Patient did not remove the lysis catheter. The Heparin carrier line to keep the sheath open had become disconnected. This line off the sheath back bled into the bed. The Sheath and the triple lumen central line remain in place. No hematoma to the left groin area noted. Auxiliary line cleaned and prepped with chloraprep and line to the sheath flushed well with sterile normal saline. I was able to draw blood  back from the line prior to flushing again with normal saline. Heparin Infusion reconnected and running without difficulties.  All lines secured with tape and Tegaderm dressings. No complications to note and vitals all remain stable.   I discussed the situation with Dr Vilinda Flake and he is aware.

## 2022-09-14 NOTE — Progress Notes (Signed)
Dr. Gilda Crease notified about the patient having absent pulses from the knee down when dopplered. He was also made aware that foot area was becoming cyanotic and cold again. Patient made NPO per verbal order from Dr. Gilda Crease.

## 2022-09-14 NOTE — Progress Notes (Signed)
Post Procedure note:  Patient resting comfortably in bed. Requires pain medications frequently. Called by the nurse as the patients left groin was bleeding. Patient removed the lysis catheter. Bandage removed. New pressure dressing applied. Dr Vilinda Flake notified.

## 2022-09-14 NOTE — Progress Notes (Signed)
Notified that the patient had an abrupt change in his pulse exam.  It appears that he was on Aggrastat at the time.  His foot on exam is significantly ischemic.  Given the results that we achieved yesterday and the failure of Aggrastat I have informed the patient that his prognosis for limb salvage is quite poor.  At this point I believe the only reasonable plan is to try thrombolysis with continuous tPA infusion.  He does have a posterior tibial but developed a large amount of thrombus in it yesterday during the procedure.  Clearly he is severely hypercoagulable since post procedure he had pulses and a pink foot and while on Aggrastat has thrombosed.  My hope is that tPA will at the very least provide a distal target for bypass but I am not encouraged.  Will plan for angio and initiation of thrombolysis this morning.  Risks and benefits were reviewed patient agrees to proceed as he is informed that the prognosis for limb salvage is very poor and he wishes to try what ever we can to save his leg.

## 2022-09-14 NOTE — Progress Notes (Signed)
PROGRESS NOTE   HPI was taken from Dr. Huel Cote: Martin Mullen is a 42 y.o. 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   Martin Mullen states that he noticed that his right foot was becoming more painful over the last 2 days, especially with standing.  He denies noticing any swelling in his legs though.  He has noticed some drainage, but states it did not look purulent.  He denies any fever, chills, nausea, vomiting, diarrhea, shortness of breath, chest pain, palpitations.  He notes that he has not been taking his Eliquis for approximately 3 months now.  When asked him why he discontinued, he stated he no longer wanted to take it but did not have any particular reason for that.   Per chart review, patient was seen at the wound care center today.  At the time, it was noted that his left second toe amputation site looks significantly worse with cyanosis.  This was noted to be a change compared to 2 weeks prior.  Due to this, he was instructed to go to the ED.   ED course: On arrival to the ED, patient was normotensive at 126/86 with heart rate of 115.  He was saturating at 98% on room air.  He was afebrile at 98.7.  Initial workup notable for WBC of 15.5, hemoglobin of 17.1, platelets of 238, sodium of 134, glucose of 288, creatinine 1.01 with GFR above 60.  Vascular surgery was consulted and patient started on IV heparin.  [facility] contacted for admission.  7/30: S/p angio and stenting on the right foot.  Status post thrombectomy.  Had a abrupt change in his pulse exam last night suggestive of foot ischemia.  Likely failure of Aggrastat per vascular 7/31: Plan for repeat angio and thrombolysis with continuous  tPA infusion today  Martin Mullen  ZOX:[id number] DOB: [birth year] DOA: 09/12/2022 PCP: [name], NP    Assessment & Plan:   Principal Problem:   Acute lower limb ischemia Active Problems:   Diabetic foot ulcer associated with type 2 diabetes mellitus (HCC)   Recurrent strokes (HCC)   Uncontrolled type 2 diabetes mellitus with hyperglycemia, with long-term current use of insulin (HCC)   HTN (hypertension)   CAD S/P percutaneous coronary angioplasty   Arterial occlusion  Assessment and Plan: Acute right lower limb ischemia: arterial duplex obtained that demonstrates severe atheromatous plaque of the distal right SFA in addition to occlusion of the right popliteal artery stent, although posterior tibial artery remains patent. Likely secondary to noncompliance w/ eliquis. Holding eliquis. Continue on IV heparin. Oxycodone, morphine prn for pain. Continue w/ neurovascular checks  -S/p angio with stenting of the right superficial femoral and popliteal artery.  Status post thrombectomy on 7/30.  Overnight he had absent pulses from the knee down on Doppler.  Foot was becoming cyanotic and cold -Vascular surgery planning repeat angio with thrombolysis and continuous tPA infusion.  He is likely severely hypercoagulable and has failure of Aggrastat.  It is possible he may lose his limb   Diabetic foot ulcer:  involving the second right toe that has been since amputated. Remains an ulcer at his amputation site, however there is no evidence of infection. 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 Has been on chronic Eliquis, but self discontinued 3 months ago. Holding eliquis. Continue on IV heparin    DM2: poorly controlled, HbA1c 7.7. Continue on glargine, SSI w/ accuchecks   Hx of CAD: s/p percutaneous coronary angioplasty. Has not been taking eliquis at home. Holding eliquis. Continue on IV heparin and aspirin,  statin    HTN: continue on home dose of losartan   Will consult palliative care for goals of care discussion -he remains at very high risk for vascular complications including ischemia and death   DVT prophylaxis: IV heparin  Code Status: full  Family Communication:  Disposition Plan: likely d/c back home   Level of care: ICU Status is: Inpatient Remains inpatient appropriate because: severity of illness, requiring IV heparin     Consultants:  Vasc surg   Procedures: Angiogram and stenting on 7/30.  Repeat angiogram and thrombolysis plan for today  Antimicrobials:  Subjective: Pt is worried about his lower extremity.  He wants to eat but knows that he needs to go back for vascular procedure  Objective: Vitals:   09/14/22 1324 09/14/22 1400 09/14/22 1500 09/14/22 1600  BP: (!) 145/76 (!) 145/76 118/63 137/75  Pulse:  (!) 107 (!) 109 (!) 108  Resp: 17 15 (!) 9 17  Temp: 98.6 F (37 C)     TempSrc: Oral     SpO2: 100% 99% 97% 96%  Weight: 86.2 kg     Height: 5\' 11"  (1.803 m)       Intake/Output Summary (Last 24 hours) at 09/14/2022 1638 Last data filed at 09/14/2022 1600 Gross per 24 hour  Intake 1079.86 ml  Output 400 ml  Net 679.86 ml   Filed Weights   09/12/22 1613 09/12/22 1616 09/14/22 1324  Weight: 86.2 kg 86.2 kg 86.2 kg    Examination:  General exam: Appears calm and comfortable  Respiratory system: clear breath sounds b/l Cardiovascular system: S1 & S2 +. No rubs, gallops or clicks. Gastrointestinal system: Abdomen is nondistended, soft and nontender. Hypoactive bowel sounds heard. Central nervous system: Awake and alert, nonfocal Extremities: Absent pulses knee down with ischemic changes of the right lower extremity below knee Psychiatry: Normal mood and affect    Data Reviewed: I have personally reviewed following labs and imaging studies  CBC: Recent Labs  Lab 09/12/22 1617 09/13/22 0647 09/14/22 0511 09/14/22 1341  WBC 15.5* 12.0*  11.8* 10.1  HGB 17.1* 15.4 14.3 15.0  HCT 49.2 42.5 40.8 43.2  MCV 90.3 86.9 87.4 90.0  PLT 238 185 220 193   Basic Metabolic Panel: Recent Labs  Lab 09/12/22 1617 09/13/22 0647 09/14/22 0511  NA 134* 136 133*  K 3.9 3.4* 3.8  CL 97* 102 103  CO2 27 24 24   GLUCOSE 288* 106* 147*  BUN 10 7 9   CREATININE 1.01 0.75 0.81  CALCIUM 9.0 8.6* 8.2*   GFR: Estimated Creatinine Clearance: 126.5 mL/min (by C-G formula based on SCr of 0.81 mg/dL). Liver Function Tests: Recent Labs  Lab 09/12/22 1617  AST 11*  ALT 10  ALKPHOS 128*  BILITOT 0.4  PROT 7.8  ALBUMIN 4.0    CBG: Recent Labs  Lab 09/13/22 2057 09/14/22 0743 09/14/22 1019 09/14/22 1327 09/14/22 1634  GLUCAP 226* 159* 129* 112* 105*     Recent Results (from the past 240 hour(s))  MRSA Next Gen by PCR, Nasal     Status: None   Collection Time: 09/13/22 12:14 PM  Specimen: Nasal Mucosa; Nasal Swab  Result Value Ref Range Status   MRSA by PCR Next Gen NOT DETECTED NOT DETECTED Final    Comment: (NOTE) The GeneXpert MRSA Assay (FDA approved for NASAL specimens only), is one component of a comprehensive MRSA colonization surveillance program. It is not intended to diagnose MRSA infection nor to guide or monitor treatment for MRSA infections. Test performance is not FDA approved in patients less than 62 years old. Performed at [facility], [address] [place], [place] [address]          Radiology Studies: PERIPHERAL VASCULAR CATHETERIZATION  Result Date: 09/14/2022 See surgical note for result.  PERIPHERAL VASCULAR CATHETERIZATION  Result Date: 09/13/2022 See surgical note for result.  US Venous Img Lower Unilateral Right  Result Date: 09/13/2022 CLINICAL DATA:  Right lower extremity pain EXAM: RIGHT LOWER EXTREMITY VENOUS DOPPLER ULTRASOUND TECHNIQUE: Gray-scale sonography with compression, as well as color and duplex ultrasound, were performed to evaluate the deep venous system(s)  from the level of the common femoral vein through the popliteal and proximal calf veins. COMPARISON:  06/26/2022 FINDINGS: VENOUS Normal compressibility of the common femoral, superficial femoral, and popliteal veins, as well as the visualized calf veins. Visualized portions of profunda femoral vein and great saphenous vein unremarkable. No filling defects to suggest DVT on grayscale or color Doppler imaging. Doppler waveforms show normal direction of venous flow, normal respiratory plasticity and response to augmentation. Limited views of the contralateral common femoral vein are unremarkable. OTHER None. Limitations: none IMPRESSION: Negative. Electronically Signed   By: Acquanetta Belling M.D.   On: 09/13/2022 11:11   [name] Lower Ext Art Right  Result Date: 09/12/2022 CLINICAL DATA:  Right lower extremity pain for 2 days EXAM: RIGHT LOWER EXTREMITY ARTERIAL DUPLEX SCAN TECHNIQUE: Gray-scale sonography as well as color Doppler and duplex ultrasound was performed to evaluate the lower extremity arteries including the common, superficial and profunda femoral arteries, popliteal artery and calf arteries. COMPARISON:  None Available. FINDINGS: Right lower Extremity Inflow: Normal common femoral arterial waveforms and velocities. No evidence of inflow (aortoiliac) disease. Outflow: The profundus femoral and proximal and mid SFA demonstrate normal triphasic waveforms and velocities. The distal right SFA demonstrates severe atheromatous plaque, with monophasic waveforms with diminished velocities measuring 21.5 centimeters/second. A stent is identified within the proximal popliteal artery, which is occluded. There is minimal flow within the mid to distal right popliteal artery, with monophasic waveforms and diminished velocities. Runoff: The posterior tibial artery is identified through the level of the ankle, with monophasic waveform noted. The anterior tibial and dorsalis pedis arteries are not identified. IMPRESSION: 1.  Severe atheromatous plaque of the distal right SFA, with monophasic waveforms and diminished velocities. 2. Occlusion of a right popliteal artery stent. 3. Patent posterior tibial artery to the level of the ankle. The anterior tibial and dorsalis pedis arteries are not identified. Four given ultrasound findings, further evaluation with angiography may be useful. Electronically Signed   By: Sharlet Salina M.D.   On: 09/12/2022 20:23        Scheduled Meds:  Chlorhexidine Gluconate Cloth  6 each Topical Daily   insulin aspart  0-15 Units Subcutaneous TID WC   sodium chloride flush  3 mL Intravenous Q12H   Continuous Infusions:  sodium chloride     alteplase (LIMB ISCHEMIA) 10 mg in normal saline (0.02 mg/mL) infusion 0.5 mg/hr (09/14/22 1600)   heparin 600 Units/hr (09/14/22 1600)     LOS: 2 days  Delfino Lovett, MD [facility] Pager [phone]  If 7PM-7AM, please contact night-coverage [email or web address] 09/14/2022, 4:38 PM

## 2022-09-14 NOTE — Telephone Encounter (Signed)
Requested Prescriptions  Pending Prescriptions Disp Refills   OZEMPIC, 0.25 OR 0.5 MG/DOSE, 2 MG/3ML SOPN [Pharmacy Med Name: OZEMPIC 0.25 OR 0.5MG /DOS1X2MG  3ML] 9 mL 0    Sig: INJECT 0.25 MG UNDER THE SKIN WEEKLY FOR 4 WEEKS, THEN INCREASE TO 0.5 MG WEEKLY     Endocrinology:  Diabetes - GLP-1 Receptor Agonists - semaglutide Failed - 09/14/2022  3:37 AM      Failed - HBA1C in normal range and within 180 days    HbA1c POC (<> result, manual entry)  Date Value Ref Range Status  02/08/2022 >14.0 4.0 - 5.6 % Final   Hgb A1c MFr Bld  Date Value Ref Range Status  08/11/2022 7.7 (H) <5.7 % of total Hgb Final    Comment:    For someone without known diabetes, a hemoglobin A1c value of 6.5% or greater indicates that they may have  diabetes and this should be confirmed with a follow-up  test. . For someone with known diabetes, a value <7% indicates  that their diabetes is well controlled and a value  greater than or equal to 7% indicates suboptimal  control. A1c targets should be individualized based on  duration of diabetes, age, comorbid conditions, and  other considerations. . Currently, no consensus exists regarding use of hemoglobin A1c for diagnosis of diabetes for children. .          Passed - Cr in normal range and within 360 days    Creat  Date Value Ref Range Status  08/11/2022 0.82 0.60 - 1.29 mg/dL Final   Creatinine, Ser  Date Value Ref Range Status  09/14/2022 0.81 0.61 - 1.24 mg/dL Final   Creatinine,U  Date Value Ref Range Status  01/18/2016 120.7 mg/dL Final   Creatinine, Urine  Date Value Ref Range Status  02/08/2022 70 20 - 320 mg/dL Final         Passed - Valid encounter within last 6 months    Recent Outpatient Visits           1 month ago Encounter for general adult medical examination with abnormal findings   [place] [facility] [place], [place] W, NP   1 month ago Type 2 diabetes mellitus with hyperlipidemia [facility])   [place]  [facility] Delles, Gentry Fitz A, RPH-CPP   2 months ago Type 2 diabetes mellitus with hyperlipidemia [facility])   [place] [facility] Delles, Gentry Fitz A, RPH-CPP   4 months ago Adjustment disorder with mixed anxiety and depressed mood   [place] [facility] [place], [place] W, NP   4 months ago Type 2 diabetes mellitus with hyperlipidemia [facility])   [place] [facility] Delles, Jackelyn Poling, RPH-CPP       Future Appointments             In 1 month [name], Salvadore Oxford, NP [place] [facility]

## 2022-09-14 NOTE — Progress Notes (Signed)
Post Procedure Note:   Patient did not remove the lysis catheter. The Heparin carrier line to keep the sheath open had become disconnected. This line off the sheath back bled into the bed. The Sheath and the triple lumen central line remain in place. No hematoma to the left groin area noted. Auxiliary line cleaned and prepped with chloraprep and line to the sheath flushed well with sterile normal saline. I was able to draw blood  back from the line prior to flushing again with normal saline. Heparin Infusion reconnected and running without difficulties.  All lines secured with tape and Tegaderm dressings. No complications to note and vitals all remain stable.   I discussed the situation with Dr Vilinda Flake and he is aware.

## 2022-09-14 NOTE — Interval H&P Note (Signed)
History and Physical Interval Note:  09/14/2022 10:25 AM  Martin Mullen  has presented today for surgery, with the diagnosis of PAD.  The various methods of treatment have been discussed with the patient and family. After consideration of risks, benefits and other options for treatment, the patient has consented to  Procedure(s): Lower Extremity Angiography (Right) as a surgical intervention.  The patient's history has been reviewed, patient examined, no change in status, stable for surgery.  I have reviewed the patient's chart and labs.  Questions were answered to the patient's satisfaction.     Levora Dredge

## 2022-09-14 NOTE — Op Note (Signed)
[facility]  Percutaneous Study/Intervention Procedural Note   Date of Surgery: 09/14/2022  Surgeon:  Renford Dills, MD.  Pre-operative Diagnosis: 1. Ischemic right lower extremity status post intervention  Post-operative diagnosis:  Ischemic right lower extremity status post intervention Fracture/deformity of SFA stent  Procedure(s) Performed:             1.  Introduction catheter into right lower extremity 3rd order catheter placement              2.    Contrast injection right lower extremity for distal runoff             3.  Percutaneous transluminal angioplasty and stent placement superficial femoral artery.              4.  Initiation of thrombolysis therapy with tPA infusion to the SFA popliteal and posterior tibial arteries.              5.  Insertion of a left common femoral venous triple-lumen catheter with ultrasound guidance  Anesthesia: Conscious sedation was administered under my direct supervision by the interventional radiology RN. IV Versed plus fentanyl were utilized. Continuous ECG, pulse oximetry and blood pressure was monitored throughout the entire procedure.  Conscious sedation was for a total of 69 minutes.  Sheath: 7 [place] Rabie left common femoral retrograde  Contrast: 20 cc  Fluoroscopy Time: 7.4 minutes  Indications:  Mareo L Rajchel presents with acute onset of ischemic symptoms status post intervention and thrombectomy yesterday.  Clinically the patient lost Doppler signals.  He is being returned to the special procedure suite for angiography and presumed thrombolysis risk and benefits of been reviewed all questions answered patient agrees to proceed.  Procedure:  LOFTON DIGIUSEPPE is a 42 y.o. y.o. male who was identified and appropriate procedural time out was performed.  The patient was then placed supine on the table and prepped and draped in the usual sterile fashion.    Ultrasound was placed in the sterile sleeve and  the left groin was evaluated the left common femoral artery was echolucent and pulsatile indicating patency.  Image was recorded for the permanent record and under real-time visualization a microneedle was inserted into the common femoral artery microwire followed by a micro-sheath.  A J-wire was then advanced through the micro-sheath and a  5 [place] sheath was then inserted over a J-wire. J-wire was then advanced and a 5 French rim catheter was  used to cross the aortic bifurcation the catheter wire were advanced down into the right distal external iliac artery. Oblique view of the femoral bifurcation was then obtained and subsequently the wire was reintroduced and the pigtail catheter negotiated into the SFA representing third order catheter placement. Distal runoff was then performed.  Diagnostic interpretation: Initial imaging demonstrates the right external iliac as well as the common femoral and profunda femoris are widely patent.  The proximal right SFA is patent.  There is occlusion at the level of the stent in the mid SFA.  There is a rather marked deformation of the stent at the leading edge of the previous placed Viabahn stent this appears to be a crush or stent fracture.  There is nonvisualization below this level there is no reconstitution of the posterior tibial or peroneal arteries.  6000 units of heparin was then given and allowed to circulate and a 6 [place] destination sheath was advanced up and over the bifurcation and positioned in the mid right common femoral  artery.  KMP  catheter and advantage Glidewire were then negotiated down into the distal popliteal.  Hand injection through the catheter demonstrated thrombus within the tibioperoneal trunk and posterior tibial.  I then negotiated the wire catheter down into the posterior tibial to the level of the ankle and again had a hand-injection of a small amount of contrast demonstrated thrombus at this level but intraluminal positioning.  At  this point given the appearance of the stent it was obvious that trying to perform thrombolysis with a near occlusive flow-limiting lesion in the mid SFA would not be successful and therefore treating this lesion was required in order to continue with thrombolytic therapy.  I felt that a Viabahn stent was the most appropriate treatment and this situation and therefore advanced a V18 wire through the Kumpe catheter down into the distal posterior tibial artery.  Unfortunately a 7 mm Viabahn requires a 7 [place] sheath and a seeker catheter is advanced over the V18 wire and then a supra core wire positioned again all the way to the distal posterior tibial this 035 wire was then used to exchange the destination sheath for a 7 [place] Rabie sheath.  The seeker was reintroduced and the V18 wire exchanged.  A 7 mm x 50 mm Viabahn stent was then deployed across the collapsed stent and postdilated with a 7 mm x 40 mm balloon inflated to 20 atm for approximately 1 minute.  Imaging of the stent showed full expansion and I then proceeded with initiation of thrombolysis.  A 135 length 50 cm infusion length catheter was then advanced down so that the tip was in the distal posterior tibial.  The infusion catheter was then flushed with heparinized saline.  Attention was then turned to placing a triple-lumen.  The ultrasound was again utilized to image the left common femoral vein.  It was echolucent and compressible.  Images recorded for the permanent record.  Lidocaine was obtained in the soft tissues and a micropuncture needle was inserted into the common femoral vein.  Microwire followed by microsheath was then placed J-wire was then advanced.  Small nick was made in the skin and the dilator passed over the wire.  Triple-lumen catheter was then fed quite easily.  All 3 lm aspirated and flushed quite easily.  Catheter was secured to the skin of the thigh with 2-0 silk suture.  The sheath and infusion catheter were then  secured to the skin of the thigh with 0 silk suture Steri-Strips and Tegaderms.  Findings:    Initial imaging demonstrates the right external iliac as well as the common femoral and profunda femoris are widely patent.  The proximal right SFA is patent.  There is occlusion at the level of the stent in the mid SFA.  There is a rather marked deformation of the stent at the leading edge of the previous placed Viabahn stent this appears to be a crush or stent fracture.  There is nonvisualization below this level there is no reconstitution of the posterior tibial or peroneal arteries.  Following stent placement across the stent fracture/collapse there is now full expansion.  Hand-injection of contrast does confirm intraluminal positioning in the distal posterior tibial with thrombus present.  Thrombolysis is initiated in the patient will return to special procedures tomorrow morning.   Summary: Successful initiation of thrombolytic therapy for limb salvage                        Disposition: Patient  was taken to the recovery room in stable condition having tolerated the procedure well.  Kiko Ripp, Latina Craver 09/14/2022,12:25 PM

## 2022-09-15 ENCOUNTER — Encounter
Admission: EM | Disposition: A | Discharge status: Skilled Nursing Facility | Payer: Self-pay | Source: Ambulatory Visit | Attending: Osteopathic Medicine

## 2022-09-15 ENCOUNTER — Encounter: Payer: Self-pay | Admitting: Anesthesiology

## 2022-09-15 ENCOUNTER — Encounter: Payer: Self-pay | Admitting: Vascular Surgery

## 2022-09-15 DIAGNOSIS — L97519 Non-pressure chronic ulcer of other part of right foot with unspecified severity: Secondary | ICD-10-CM | POA: Diagnosis not present

## 2022-09-15 DIAGNOSIS — I7090 Unspecified atherosclerosis: Secondary | ICD-10-CM | POA: Diagnosis not present

## 2022-09-15 DIAGNOSIS — I639 Cerebral infarction, unspecified: Secondary | ICD-10-CM | POA: Diagnosis not present

## 2022-09-15 DIAGNOSIS — I743 Embolism and thrombosis of arteries of the lower extremities: Secondary | ICD-10-CM | POA: Diagnosis not present

## 2022-09-15 DIAGNOSIS — I70221 Atherosclerosis of native arteries of extremities with rest pain, right leg: Secondary | ICD-10-CM | POA: Diagnosis not present

## 2022-09-15 DIAGNOSIS — E1165 Type 2 diabetes mellitus with hyperglycemia: Secondary | ICD-10-CM | POA: Diagnosis not present

## 2022-09-15 DIAGNOSIS — Z9889 Other specified postprocedural states: Secondary | ICD-10-CM | POA: Diagnosis not present

## 2022-09-15 DIAGNOSIS — I998 Other disorder of circulatory system: Secondary | ICD-10-CM | POA: Diagnosis not present

## 2022-09-15 DIAGNOSIS — E11621 Type 2 diabetes mellitus with foot ulcer: Secondary | ICD-10-CM | POA: Diagnosis not present

## 2022-09-15 DIAGNOSIS — T82868A Thrombosis of vascular prosthetic devices, implants and grafts, initial encounter: Secondary | ICD-10-CM | POA: Diagnosis not present

## 2022-09-15 HISTORY — PX: LOWER EXTREMITY ANGIOGRAPHY: CATH118251

## 2022-09-15 LAB — BASIC METABOLIC PANEL
Anion gap: 11 (ref 5–15)
BUN: 7 mg/dL (ref 6–20)
CO2: 22 mmol/L (ref 22–32)
Calcium: 8.4 mg/dL — ABNORMAL LOW (ref 8.9–10.3)
Chloride: 98 mmol/L (ref 98–111)
Creatinine, Ser: 0.78 mg/dL (ref 0.61–1.24)
GFR, Estimated: >60 mL/min (ref >60)
Glucose, Bld: 175 mg/dL — ABNORMAL HIGH (ref 70–99)
Potassium: 4 mmol/L (ref 3.5–5.1)
Sodium: 131 mmol/L — ABNORMAL LOW (ref 135–145)

## 2022-09-15 LAB — GLUCOSE, CAPILLARY
Glucose-Capillary: 156 mg/dL — ABNORMAL HIGH (ref 70–99)
Glucose-Capillary: 141 mg/dL — ABNORMAL HIGH (ref 70–99)
Glucose-Capillary: 172 mg/dL — ABNORMAL HIGH (ref 70–99)
Glucose-Capillary: 239 mg/dL — ABNORMAL HIGH (ref 70–99)
Glucose-Capillary: 126 mg/dL — ABNORMAL HIGH (ref 70–99)

## 2022-09-15 SURGERY — LOWER EXTREMITY ANGIOGRAPHY
Anesthesia: Moderate Sedation | Laterality: Right

## 2022-09-15 MED ORDER — MIDAZOLAM HCL 2 MG/2ML IJ SOLN
INTRAMUSCULAR | Status: DC | PRN
  Administered 2022-09-15: 2 mg via INTRAVENOUS

## 2022-09-15 MED ORDER — MIDAZOLAM HCL 2 MG/2ML IJ SOLN
INTRAMUSCULAR | Status: AC
  Filled 2022-09-15: qty 2

## 2022-09-15 MED ORDER — CEFAZOLIN SODIUM-DEXTROSE 2-4 GM/100ML-% IV SOLN
2.0000 g | INTRAVENOUS | Status: AC
  Administered 2022-09-15: 2 g via INTRAVENOUS

## 2022-09-15 MED ORDER — HEPARIN SODIUM (PORCINE) 1000 UNIT/ML IJ SOLN
INTRAMUSCULAR | Status: DC | PRN
  Administered 2022-09-15: 4000 [IU] via INTRAVENOUS

## 2022-09-15 MED ORDER — LIDOCAINE-EPINEPHRINE (PF) 1 %-1:200000 IJ SOLN
INTRAMUSCULAR | Status: DC | PRN
  Administered 2022-09-15: 10 mL via INTRADERMAL

## 2022-09-15 MED ORDER — ACETAMINOPHEN 325 MG PO TABS: 650.0000 mg | ORAL_TABLET | Freq: Four times a day (QID) | ORAL | Status: DC | PRN

## 2022-09-15 MED ORDER — OXYCODONE HCL 5 MG PO TABS
5.0000 mg | ORAL_TABLET | Freq: Four times a day (QID) | ORAL | Status: DC | PRN
  Administered 2022-09-15 – 2022-09-17 (×4): 5 mg via ORAL
  Filled 2022-09-15 (×4): qty 1

## 2022-09-15 MED ORDER — HYDROMORPHONE HCL 1 MG/ML IJ SOLN
1.0000 mg | INTRAMUSCULAR | Status: DC | PRN
  Administered 2022-09-15 – 2022-09-26 (×33): 1 mg via INTRAVENOUS
  Filled 2022-09-15 (×33): qty 1

## 2022-09-15 MED ORDER — ACETAMINOPHEN 325 MG PO TABS
650.0000 mg | ORAL_TABLET | Freq: Four times a day (QID) | ORAL | Status: DC | PRN
  Administered 2022-09-15 – 2022-09-20 (×6): 650 mg via ORAL
  Filled 2022-09-15 (×6): qty 2

## 2022-09-15 MED ORDER — FENTANYL CITRATE (PF) 100 MCG/2ML IJ SOLN
INTRAMUSCULAR | Status: DC | PRN
  Administered 2022-09-15: 50 ug via INTRAVENOUS

## 2022-09-15 MED ORDER — CEFAZOLIN SODIUM-DEXTROSE 2-4 GM/100ML-% IV SOLN
INTRAVENOUS | Status: AC
  Filled 2022-09-15: qty 100

## 2022-09-15 MED ORDER — HEPARIN SODIUM (PORCINE) 1000 UNIT/ML IJ SOLN
INTRAMUSCULAR | Status: AC
  Filled 2022-09-15: qty 10

## 2022-09-15 MED ORDER — SODIUM CHLORIDE 0.9 % IV SOLN: INTRAVENOUS | Status: DC

## 2022-09-15 MED ORDER — TIROFIBAN (AGGRASTAT) BOLUS VIA INFUSION
25.0000 ug/kg | Freq: Once | INTRAVENOUS | Status: AC
  Administered 2022-09-15: 2155 ug via INTRAVENOUS

## 2022-09-15 MED ORDER — TIROFIBAN HCL IV 12.5 MG/250 ML
INTRAVENOUS | Status: AC
  Filled 2022-09-15: qty 250

## 2022-09-15 MED ORDER — IODIXANOL 320 MG/ML IV SOLN
INTRAVENOUS | Status: DC | PRN
  Administered 2022-09-15: 65 mL via INTRA_ARTERIAL

## 2022-09-15 MED ORDER — TIROFIBAN HCL IN NACL 5-0.9 MG/100ML-% IV SOLN
0.1500 ug/kg/min | INTRAVENOUS | Status: AC
  Administered 2022-09-16: 0.15 ug/kg/min via INTRAVENOUS
  Filled 2022-09-15 (×3): qty 100

## 2022-09-15 MED ORDER — HEPARIN (PORCINE) IN NACL 1000-0.9 UT/500ML-% IV SOLN
INTRAVENOUS | Status: DC | PRN
  Administered 2022-09-15: 1000 mL

## 2022-09-15 MED ORDER — FENTANYL CITRATE (PF) 100 MCG/2ML IJ SOLN
INTRAMUSCULAR | Status: AC
  Filled 2022-09-15: qty 2

## 2022-09-15 SURGICAL SUPPLY — 17 items
BALLN LUTONIX DCB 6X60X130 (BALLOONS) ×1
BALLN ULTRVRSE 2.5X220X150 (BALLOONS) ×1
BALLOON LUTONIX DCB 6X60X130 (BALLOONS) IMPLANT
BALLOON ULTRVRSE 2.5X220X150 (BALLOONS) IMPLANT
CANISTER PENUMBRA ENGINE (MISCELLANEOUS) IMPLANT
CATH LIGHTNING BOLT 7 130 (CATHETERS) IMPLANT
COVER EZ STRL 42X30 (DRAPES) IMPLANT
DEVICE STARCLOSE SE CLOSURE (Vascular Products) IMPLANT
GLIDEWIRE ADV .035X260CM (WIRE) IMPLANT
KIT ENCORE 26 ADVANTAGE (KITS) IMPLANT
PACK ANGIOGRAPHY (CUSTOM PROCEDURE TRAY) ×2 IMPLANT
SHEATH FLEXOR ANSEL2 7FRX45 (SHEATH) IMPLANT
STENT VIABAHN 7X50X120 (Permanent Stent) ×1 IMPLANT
STENT VIABAHN 7X5X120 7FR (Permanent Stent) IMPLANT
VALVE HEMO TOUHY BORST Y (ADAPTER) IMPLANT
WIRE G V18X300 ST (WIRE) IMPLANT
WIRE SUPRACORE 300CM (WIRE) IMPLANT

## 2022-09-15 NOTE — Op Note (Signed)
[facility]  Percutaneous Study/Intervention Procedural Note   Date of Surgery: 09/15/2022  Surgeon(s):Rainelle Sulewski    Assistants:none  Pre-operative Diagnosis: PAD with rest pain RLE, acute on chronic ischemia, s/p overnight thrombolytic therapy  Post-operative diagnosis:  Same  Procedure(s) Performed:             1.  Right lower extremity angiogram through existing catheter             2.  Mechanical thrombectomy of the right SFA, popliteal artery, tibioperoneal trunk, and posterior tibial artery with the penumbra 7 French bolt device             3.  Stent placement to the right SFA with 7 mm diameter by 5 cm length Viabahn stent for residual stenosis and thrombosis after thrombectomy             4.  Percutaneous transluminal angioplasty of right posterior tibial artery for residual stenosis and thrombosis after thrombectomy with 2.5 mm diameter by 22 cm length angioplasty balloon             5.  StarClose closure device left femoral artery  EBL: 200 cc  Contrast: 65 cc  Fluoro Time: 8.7 minutes  Moderate Conscious Sedation Time: approximately 61 minutes using 2 mg of Versed and 50 mcg of Fentanyl              Indications:  Patient is a 42 y.o.male with an ischemic right lower extremity.  He has been running overnight thrombolytic therapy. The patient is brought in for angiography for further evaluation and potential treatment.  Due to the limb threatening nature of the situation, angiogram was performed for attempted limb salvage. The patient is aware that if the procedure fails, amputation would be expected.  The patient also understands that even with successful revascularization, amputation may still be required due to the severity of the situation.  Risks and benefits are discussed and informed consent is obtained.   Procedure:  The patient was identified and appropriate procedural time out was performed.  The patient was then placed supine on the table and  prepped and draped in the usual sterile fashion. Moderate conscious sedation was administered during a face to face encounter with the patient throughout the procedure with my supervision of the RN administering medicines and monitoring the patient's vital signs, pulse oximetry, telemetry and mental status throughout from the start of the procedure until the patient was taken to the recovery room.  An advantage wire was placed through the existing lysis catheter and the lysis catheter was removed.  I then upsized to a 7 [place] sheath.  Selective right lower extremity angiogram was then performed. This demonstrated continued thrombosis of the right SFA and popliteal stents with essentially no runoff distally on initial imaging. It was felt that it was in the patient's best interest to proceed with intervention after these images to avoid a second procedure and a larger amount of contrast and fluoroscopy based off of the findings from the initial angiogram. The patient was systemically heparinized. I then brought the penumbra 7 bolt device onto the field and began performing mechanical thrombectomy throughout the right SFA, popliteal artery, tibioperoneal trunk, and posterior tibial artery and exchanged for a V18 wire.  Several passes were made with large chunks of thrombus removed.  There remained narrowing at the top edge of the stent in the SFA from likely stenosis and thrombus creating greater than 70% narrowing.  The posterior tibial artery  was also small and narrowed in the proximal and mid segment normalizing in the mid to distal segment and then feeding the foot.  I elected to treat the posterior tibial artery with a 2.5 mm diameter by 22 cm length angioplasty balloon inflated to 12 atm for 1 minute.  The SFA lesion was primarily stented with a 7 mm diameter by 5 cm length Viabahn stent postdilated with a 6 mm Lutonix drug-coated angioplasty balloon.  Completion imaging showed less than 10% residual stenosis  in both the posterior tibial artery and SFA after intervention.  There was still a small amount of residual thrombus within the stents and so went back in with the penumbra 7 bolt device and performed thrombectomy 1 more time with resolution of the thrombus after this pass.  I elected to terminate the procedure. The sheath was removed and StarClose closure device was deployed in the left femoral artery with excellent hemostatic result. The patient was taken to the recovery room in stable condition having tolerated the procedure well.  Findings:                          Right Lower Extremity:  This demonstrated continued thrombosis of the right SFA and popliteal stents with essentially no runoff distally on initial imaging.   Disposition: Patient was taken to the recovery room in stable condition having tolerated the procedure well.  Complications: None  Martin Mullen 09/15/2022 11:57 AM   This note was created with Dragon Medical transcription system. Any errors in dictation are purely unintentional.

## 2022-09-15 NOTE — Consult Note (Addendum)
Hematology/Oncology Consult note [phone] [phone]      Patient Care Team: [name], NP as PCP - General (Internal Medicine) Pa, [facility] [place], A. Azucena Kuba, MD (Ophthalmology) Ronney Asters Jackelyn Poling, RPH-CPP as Pharmacist   Name of the patient: Martin Mullen  [id number]  09/29/1980   REASON FOR COSULTATION:  Hypercoagulable workup History of presenting illness-  42 y.o. male with PMH listed at below who presents to ER for evaluation of right foot pain.  He has history of peripheral artery disease, status post percutaneous transluminal angioplasty and stent placement of the right SFA approximately 2 months ago. Patient was previously on Eliquis 5 mg twice daily for chronic anticoagulation. He also stopped antiplatelet therapy as well.  - Arterial duplex obtained that demonstrates severe atheromatous plaque of the distal right SFA in addition to occlusion of the right popliteal artery stent.    He self stopped anticoagulation 3 months ago.  Patient was restarted on heparin infusion, patient was seen by vascular surgeon.  09/13/2022 status post percutaneous transluminal angioplasty and stent placement of the right SFA artery and right popliteal artery.  Atherectomy right SFA and popliteal at the previous stent site.Thrombectomy with the penumbra CAT 6 thrombectomy catheter right distal popliteal and right posterior tibial artery.  tPA thrombolysis.  09/14/2022, patient developed abrupt change in the close examination.  Foot examination is significantly ischemic.  It was felt that he failed Aggrastat.  He was started on tPA overnight, 09/15/2022 patient is status post repeat angiogram gram with mechanical embolectomy and stenting.  tPA thrombolysis.  He has a history of stroke [Nov 2021]involving left thalamus and right thalamus in the right frontal white matter.  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 negative for factor V Leiden mutation, normal protein C&S activity and antigen level, Antithrombin activity 106%, normal homocystine level, normal beta-2 glycoprotein antibodies.  Negative lupus anticoagulant. Slight increase in anticardiolipin IgM-14, which did not meet antiphospholipid syndrome diagnostic criteria.   Patient was seen at bedside.  Patient was eating food.  Reports some pain of lower extremity.  Poor historian.  No Known Allergies  Patient Active Problem List   Diagnosis Date Noted   Arterial occlusion 09/13/2022   Acute lower limb ischemia 09/12/2022   PAD (peripheral artery disease) (HCC) 06/27/2022   Chronic anticoagulation 06/26/2022   Diabetic foot ulcer associated with type 2 diabetes mellitus (HCC) 06/26/2022   Overweight with body mass index (BMI) of 27 to 27.9 in adult 10/08/2020   Recurrent strokes (HCC) 08/27/2020   GERD (gastroesophageal reflux disease) 07/20/2019   CAD S/P percutaneous coronary angioplasty    Uncontrolled type 2 diabetes mellitus with hyperglycemia, with long-term current use of insulin (HCC) 07/09/2013   HTN (hypertension) 09/05/2012   Hyperlipidemia associated with type 2 diabetes mellitus (HCC) 09/05/2012   Adjustment disorder with mixed anxiety and depressed mood 09/05/2012     Past Medical History:  Diagnosis Date   Acute transmural inferior wall MI (HCC) 08/28/2012   .5 x 12 mm Veri-flex stent non-DES.   Chicken pox    Coronary artery disease    Inferior ST elevation myocardial infarction in July of 2014. Cardiac catheterization showed 95% distal RCA stenosis and 90% first diagonal stenosis with normal ejection fraction. He underwent PCI in bare-metal stent placement to the  distal RCA.   Diabetes mellitus without complication (HCC)    Hypercholesterolemia    Stroke  [facility])    Tobacco use      Past Surgical History:  Procedure Laterality Date   AMPUTATION TOE Right 06/27/2022   Procedure: AMPUTATION TOE RIGHT SECOND TOE;  Surgeon: Candelaria Stagers, DPM;  Location: [facility] ORS;  Service: Podiatry;  Laterality: Right;   CORONARY ANGIOPLASTY WITH STENT PLACEMENT     DEBRIDEMENT AND CLOSURE WOUND N/A 09/03/2019   Procedure: Debridement and closure of scrotal wound;  Surgeon: Allena Napoleon, MD;  Location: [facility];  Service: Plastics;  Laterality: N/A;   DEBRIDEMENT AND CLOSURE WOUND N/A 07/25/2019   Procedure: DEBRIDEMENT AND CLOSURE WOUND;  Surgeon: Allena Napoleon, MD;  Location: [facility] ORS;  Service: Plastics;  Laterality: N/A;   INCISION AND DRAINAGE Right 06/27/2022   Procedure: INCISION AND DRAINAGE;  Surgeon: Candelaria Stagers, DPM;  Location: [facility];  Service: Podiatry;  Laterality: Right;   INCISION AND DRAINAGE ABSCESS N/A 07/20/2019   Procedure: INCISION AND DRAINAGE ABSCESS;  Surgeon: Crista Elliot, MD;  Location: [facility] ORS;  Service: Urology;  Laterality: N/A;   INCISION AND DRAINAGE OF WOUND Right 06/30/2022   Procedure: IRRIGATION AND DEBRIDEMENT WOUND WITH CLOSURE;  Surgeon: Candelaria Stagers, DPM;  Location: [facility];  Service: Podiatry;  Laterality: Right;   LEFT HEART CATHETERIZATION WITH CORONARY ANGIOGRAM N/A 08/28/2012   Procedure: LEFT HEART CATHETERIZATION WITH CORONARY ANGIOGRAM;  Surgeon: Pamella Pert, MD;  Location: [facility] CATH LAB;  Service: Cardiovascular;  Laterality: N/A;   LOWER EXTREMITY ANGIOGRAPHY Right 06/29/2022   Procedure: Lower Extremity Angiography;  Surgeon: Annice Needy, MD;  Location: [facility] INVASIVE CV LAB;  Service: Cardiovascular;  Laterality: Right;   LOWER EXTREMITY ANGIOGRAPHY Right 09/13/2022   Procedure: Lower Extremity Angiography;  Surgeon: Renford Dills, MD;  Location: [facility] INVASIVE CV LAB;  Service: Cardiovascular;  Laterality: Right;   LOWER EXTREMITY ANGIOGRAPHY Right 09/14/2022   Procedure:  Lower Extremity Angiography;  Surgeon: Renford Dills, MD;  Location: [facility] INVASIVE CV LAB;  Service: Cardiovascular;  Laterality: Right;   TEE WITHOUT CARDIOVERSION N/A 09/23/2020   Procedure: TRANSESOPHAGEAL ECHOCARDIOGRAM (TEE);  Surgeon: Lamar Blinks, MD;  Location: [facility];  Service: Cardiovascular;  Laterality: N/A;   TEE WITHOUT CARDIOVERSION N/A 07/01/2022   Procedure: TRANSESOPHAGEAL ECHOCARDIOGRAM;  Surgeon: Antonieta Iba, MD;  Location: [facility] OR[facility];  Service: Cardiovascular;  Laterality: N/A;   WRIST SURGERY  1988    Social History   Socioeconomic History   Marital status: Divorced    Spouse name: Not on file   Number of children: 2   Years of education: Not on file   Highest education level: Not on file  Occupational History   Not on file  Tobacco Use   Smoking status: Every Day    Current packs/day: 1.00    Average packs/day: 1 pack/day for 30.0 years (30.0 ttl pk-yrs)    Types: Cigarettes   Smokeless tobacco: Never  Vaping Use   Vaping status: Never Used  Substance and Sexual Activity   Alcohol use: Not Currently    Alcohol/week: 0.0 standard drinks of alcohol    Comment: occasional   Drug use: Not Currently    Types: Marijuana   Sexual activity: Yes    Birth control/protection: None  Other Topics Concern   Not on file  Social History Narrative   Lives with girlfriend and her mother    Social  Determinants of Health   Financial Resource Strain: Low Risk  (06/10/2022)   Overall Financial Resource Strain (CARDIA)    Difficulty of Paying Living Expenses: Not very hard  Food Insecurity: No Food Insecurity (06/29/2022)   Hunger Vital Sign    Worried About Running Out of Food in the Last Year: Never true    Ran Out of Food in the Last Year: Never true  Transportation Needs: No Transportation Needs (06/29/2022)   PRAPARE - Administrator, Civil Service (Medical): No    Lack of Transportation (Non-Medical): No  Physical Activity: Not on file   Stress: Not on file  Social Connections: Not on file  Intimate Partner Violence: Not At Risk (06/29/2022)   Humiliation, Afraid, Rape, and Kick questionnaire    Fear of Current or Ex-Partner: No    Emotionally Abused: No    Physically Abused: No    Sexually Abused: No     Family History  Problem Relation Age of Onset   Arthritis Mother        Rheumatoid   Diabetes Mother    Hyperlipidemia Mother    Diabetes Father    Cancer Maternal Grandfather        Prostate   Parkinson's disease Maternal Grandfather      Current Facility-Administered Medications:    [MAR Hold] 0.9 %  sodium chloride infusion, 250 mL, Intravenous, PRN, Schnier, Latina Craver, MD   0.9 %  sodium chloride infusion, , Intravenous, Continuous, Schnier, Latina Craver, MD, Last Rate: 75 mL/hr at 09/15/22 0924, New Bag at 09/15/22 0924   alteplase (LIMB ISCHEMIA) 10 mg in normal saline (0.02 mg/mL) infusion, 0.5 mg/hr, Intracatheter, Continuous, Schnier, Latina Craver, MD, Last Rate: 25 mL/hr at 09/15/22 0851, 0.5 mg/hr at 09/15/22 0851   [MAR Hold] Chlorhexidine Gluconate Cloth 2 % PADS 6 each, 6 each, Topical, Daily, Delfino Lovett, MD, 6 each at 09/14/22 2155   fentaNYL (SUBLIMAZE) injection, , , PRN, Annice Needy, MD, 50 mcg at 09/15/22 1055   Heparin (Porcine) in NaCl 1000-0.9 UT/500ML-% SOLN, , , PRN, Annice Needy, MD, 1,000 mL at 09/15/22 1156   heparin ADULT infusion 100 units/mL (25000 units/219mL), 600 Units/hr, Intra-arterial, Continuous, Schnier, Latina Craver, MD, Stopped at 09/15/22 1038   heparin sodium (porcine) injection, , , PRN, Annice Needy, MD, 4,000 Units at 09/15/22 1111   [MAR Hold] hydrALAZINE (APRESOLINE) injection 5 mg, 5 mg, Intravenous, Q20 Min PRN, Schnier, Latina Craver, MD   [MAR Hold] HYDROmorphone (DILAUDID) injection 1 mg, 1 mg, Intravenous, Q2H PRN, Schnier, Latina Craver, MD, 1 mg at 09/15/22 0829   [MAR Hold] insulin aspart (novoLOG) injection 0-15 Units, 0-15 Units, Subcutaneous, TID WC, Schnier, Latina Craver,  MD, 3 Units at 09/15/22 0825   iodixanol (VISIPAQUE) 320 MG/ML injection, , , PRN, Annice Needy, MD, 65 mL at 09/15/22 1151   [MAR Hold] labetalol (NORMODYNE) injection 10 mg, 10 mg, Intravenous, Q10 min PRN, Schnier, Latina Craver, MD, 10 mg at 09/14/22 2043   lidocaine-EPINEPHrine (PF) (XYLOCAINE-EPINEPHrine) 1 %-1:200000 (PF) injection, , , PRN, Annice Needy, MD, 10 mL at 09/15/22 1105   [MAR Hold] midazolam (VERSED) injection 1 mg, 1 mg, Intravenous, Q1H PRN, Schnier, Latina Craver, MD   midazolam (VERSED) injection, , , PRN, Annice Needy, MD, 2 mg at 09/15/22 1055   [MAR Hold] ondansetron (ZOFRAN) injection 4 mg, 4 mg, Intravenous, Q6H PRN, Schnier, Latina Craver, MD   [facility] Hold] sodium chloride flush (NS) 0.9 % injection  3 mL, 3 mL, Intravenous, Q12H, Schnier, Latina Craver, MD, 15 mL at 09/14/22 2155   [facility] Hold] sodium chloride flush (NS) 0.9 % injection 3 mL, 3 mL, Intravenous, PRN, Schnier, Latina Craver, MD  Review of Systems  Constitutional:  Positive for fatigue. Negative for appetite change and unexpected weight change.  Respiratory:  Negative for chest tightness.   Cardiovascular:  Negative for chest pain.  Gastrointestinal:  Negative for abdominal pain.  Genitourinary:  Negative for dysuria.   Hematological:  Negative for adenopathy.  Psychiatric/Behavioral:  Negative for confusion.     PHYSICAL EXAM Vitals:   09/15/22 1144 09/15/22 1145 09/15/22 1149 09/15/22 1154  BP: 119/74  128/75 (!) 152/86  Pulse: (!) 109  (!) 111 (!) 109  Resp: 13  14 13   Temp:      TempSrc:      SpO2: 97% 96% 98% 99%  Weight:      Height:       Physical Exam Constitutional:      General: He is not in acute distress.    Appearance: He is not diaphoretic.  HENT:     Head: Normocephalic and atraumatic.  Eyes:     General: No scleral icterus. Cardiovascular:     Rate and Rhythm: Normal rate.  Pulmonary:     Effort: Pulmonary effort is normal. No respiratory distress.  Abdominal:     Palpations: Abdomen is  soft.  Musculoskeletal:        General: Tenderness present. No swelling.     Cervical back: Normal range of motion and neck supple.  Skin:    General: Skin is dry.  Neurological:     Mental Status: He is alert and oriented to person, place, and time.     Cranial Nerves: No cranial nerve deficit.     Motor: No abnormal muscle tone.     Coordination: Coordination normal.  Psychiatric:        Mood and Affect: Affect normal.     Comments: flat       LABORATORY STUDIES    Latest Ref Rng & Units 09/15/2022    6:26 AM 09/15/2022   12:34 AM 09/14/2022    7:47 PM  CBC  WBC 4.0 - 10.5 K/uL 15.9  16.9  14.2   Hemoglobin 13.0 - 17.0 g/dL 16.1  09.6  04.5   Hematocrit 39.0 - 52.0 % 41.0  40.3  43.8   Platelets 150 - 400 K/uL 168  163  179       Latest Ref Rng & Units 09/15/2022    6:26 AM 09/14/2022    5:11 AM 09/13/2022    6:47 AM  CMP  Glucose 70 - 99 mg/dL 409  811  914   BUN 6 - 20 mg/dL 7  9  7    Creatinine 0.61 - 1.24 mg/dL 7.82  9.56  2.13   Sodium 135 - 145 mmol/L 131  133  136   Potassium 3.5 - 5.1 mmol/L 4.0  3.8  3.4   Chloride 98 - 111 mmol/L 98  103  102   CO2 22 - 32 mmol/L 22  24  24    Calcium 8.9 - 10.3 mg/dL 8.4  8.2  8.6      RADIOGRAPHIC STUDIES: I have personally reviewed the radiological images as listed and agreed with the findings in the report. PERIPHERAL VASCULAR CATHETERIZATION  Result Date: 09/15/2022 See surgical note for result.  PERIPHERAL VASCULAR CATHETERIZATION  Result Date: 09/14/2022 See surgical note for  result.  PERIPHERAL VASCULAR CATHETERIZATION  Result Date: 09/13/2022 See surgical note for result.  US Venous Img Lower Unilateral Right  Result Date: 09/13/2022 CLINICAL DATA:  Right lower extremity pain EXAM: RIGHT LOWER EXTREMITY VENOUS DOPPLER ULTRASOUND TECHNIQUE: Gray-scale sonography with compression, as well as color and duplex ultrasound, were performed to evaluate the deep venous system(s) from the level of the common femoral vein  through the popliteal and proximal calf veins. COMPARISON:  06/26/2022 FINDINGS: VENOUS Normal compressibility of the common femoral, superficial femoral, and popliteal veins, as well as the visualized calf veins. Visualized portions of profunda femoral vein and great saphenous vein unremarkable. No filling defects to suggest DVT on grayscale or color Doppler imaging. Doppler waveforms show normal direction of venous flow, normal respiratory plasticity and response to augmentation. Limited views of the contralateral common femoral vein are unremarkable. OTHER None. Limitations: none IMPRESSION: Negative. Electronically Signed   By: Acquanetta Belling M.D.   On: 09/13/2022 11:11   [name] Lower Ext Art Right  Result Date: 09/12/2022 CLINICAL DATA:  Right lower extremity pain for 2 days EXAM: RIGHT LOWER EXTREMITY ARTERIAL DUPLEX SCAN TECHNIQUE: Gray-scale sonography as well as color Doppler and duplex ultrasound was performed to evaluate the lower extremity arteries including the common, superficial and profunda femoral arteries, popliteal artery and calf arteries. COMPARISON:  None Available. FINDINGS: Right lower Extremity Inflow: Normal common femoral arterial waveforms and velocities. No evidence of inflow (aortoiliac) disease. Outflow: The profundus femoral and proximal and mid SFA demonstrate normal triphasic waveforms and velocities. The distal right SFA demonstrates severe atheromatous plaque, with monophasic waveforms with diminished velocities measuring 21.5 centimeters/second. A stent is identified within the proximal popliteal artery, which is occluded. There is minimal flow within the mid to distal right popliteal artery, with monophasic waveforms and diminished velocities. Runoff: The posterior tibial artery is identified through the level of the ankle, with monophasic waveform noted. The anterior tibial and dorsalis pedis arteries are not identified. IMPRESSION: 1. Severe atheromatous plaque of the distal  right SFA, with monophasic waveforms and diminished velocities. 2. Occlusion of a right popliteal artery stent. 3. Patent posterior tibial artery to the level of the ankle. The anterior tibial and dorsalis pedis arteries are not identified. Four given ultrasound findings, further evaluation with angiography may be useful. Electronically Signed   By: Sharlet Salina M.D.   On: 09/12/2022 20:23   ECHO TEE  Result Date: 07/01/2022    TRANSESOPHOGEAL ECHO REPORT   Patient Name:   HODGE SHIFFLER Date of Exam: 07/01/2022 Medical Rec #:  [id number]       Height:       70.0 in Accession #:    [id number]      Weight:       203.9 lb Date of Birth:  29-May-1980       BSA:          2.105 m Patient Age:    42 years        BP:           107/80 mmHg Patient Gender: M               HR:           86 bpm. Exam Location:  [facility] Procedure: Transesophageal Echo, Cardiac Doppler, Color Doppler and Saline            Contrast Bubble Study Indications:     Bacteremia R78.81  History:         Patient  has prior history of Echocardiogram examinations, most                  recent 06/28/2022. Stroke; Risk Factors:Diabetes. Tobacco use.  Sonographer:     Cristela Blue Referring Phys:  [id number] Caryn Bee P PATEL Diagnosing Phys: Julien Nordmann MD PROCEDURE: After discussion of the risks and benefits of a TEE, an informed consent was obtained. TEE procedure time was 30 minutes. The transesophogeal probe was passed without difficulty through the esophogus of the patient. Imaged were obtained with the patient in a left lateral decubitus position. Local oropharyngeal anesthetic was provided with Cetacaine and viscous lidocaine. Sedation performed by different physician. Image quality was good. The patient's vital signs; including heart rate, blood pressure, and oxygen saturation; remained stable throughout the procedure. The patient developed no complications during the procedure.  IMPRESSIONS  1. Left ventricular ejection fraction, by estimation, is 60 to  65%. The left ventricle has normal function. The left ventricle has no regional wall motion abnormalities.  2. Right ventricular systolic function is normal. The right ventricular size is normal.  3. No left atrial/left atrial appendage thrombus was detected.  4. The mitral valve is normal in structure. Mild mitral valve regurgitation. No evidence of mitral stenosis.  5. The aortic valve is normal in structure. Aortic valve regurgitation is not visualized. Aortic valve sclerosis is present, calcification noted on NCC, unable to exclude mild prolapse. No evidence of aortic valve stenosis.  6. The inferior vena cava is normal in size with greater than 50% respiratory variability, suggesting right atrial pressure of 3 mmHg.  7. Agitated saline contrast bubble study was negative, with no evidence of any interatrial shunt. Conclusion(s)/Recommendation(s): Normal biventricular function without evidence of hemodynamically significant valvular heart disease. FINDINGS  Left Ventricle: Left ventricular ejection fraction, by estimation, is 60 to 65%. The left ventricle has normal function. The left ventricle has no regional wall motion abnormalities. The left ventricular internal cavity size was normal in size. There is  no left ventricular hypertrophy. Right Ventricle: The right ventricular size is normal. No increase in right ventricular wall thickness. Right ventricular systolic function is normal. Left Atrium: Left atrial size was normal in size. No left atrial/left atrial appendage thrombus was detected. Right Atrium: Right atrial size was normal in size. Pericardium: There is no evidence of pericardial effusion. Mitral Valve: The mitral valve is normal in structure. Mild mitral valve regurgitation. No evidence of mitral valve stenosis. There is no evidence of mitral valve vegetation. Tricuspid Valve: The tricuspid valve is normal in structure. Tricuspid valve regurgitation is trivial. No evidence of tricuspid stenosis.  There is no evidence of tricuspid valve vegetation. Aortic Valve: The aortic valve is normal in structure. Aortic valve regurgitation is not visualized. Aortic valve sclerosis is present, with no evidence of aortic valve stenosis. There is no evidence of aortic valve vegetation. Pulmonic Valve: The pulmonic valve was normal in structure. Pulmonic valve regurgitation is not visualized. No evidence of pulmonic stenosis. There is no evidence of pulmonic valve vegetation. Aorta: The aortic root is normal in size and structure. Venous: The inferior vena cava is normal in size with greater than 50% respiratory variability, suggesting right atrial pressure of 3 mmHg. IAS/Shunts: No atrial level shunt detected by color flow Doppler. Agitated saline contrast was given intravenously to evaluate for intracardiac shunting. Agitated saline contrast bubble study was negative, with no evidence of any interatrial shunt. There  is no evidence of a patent foramen ovale. There is no evidence  of an atrial septal defect. Julien Nordmann MD Electronically signed by Julien Nordmann MD Signature Date/Time: 07/01/2022/5:52:32 PM    Final (Updated)    ECHOCARDIOGRAM COMPLETE  Result Date: 06/30/2022    ECHOCARDIOGRAM REPORT   Patient Name:   KRAIG BONDI Date of Exam: 06/28/2022 Medical Rec #:  [id number]       Height:       70.0 in Accession #:    [id number]      Weight:       203.9 lb Date of Birth:  Oct 04, 1980       BSA:          2.105 m Patient Age:    42 years        BP:           147/97 mmHg Patient Gender: M               HR:           103 bpm. Exam Location:  [facility] Procedure: 2D Echo, Cardiac Doppler and Color Doppler Indications:     R78.81 Bacteremia  History:         Patient has prior history of Echocardiogram examinations, most                  recent 09/12/2021. Previous Myocardial Infarction and CAD,                  Stroke; Risk Factors:Diabetes, Dyslipidemia and Current Smoker.  Sonographer:     Daphine Deutscher RDCS  Referring Phys:  [id number] Lynn Ito Diagnosing Phys: Julien Nordmann MD IMPRESSIONS  1. Left ventricular ejection fraction, by estimation, is 60 to 65%. The left ventricle has normal function. The left ventricle has no regional wall motion abnormalities. Left ventricular diastolic parameters were normal.  2. Right ventricular systolic function is normal. The right ventricular size is normal.  3. The mitral valve is normal in structure. Mild mitral valve regurgitation. No evidence of mitral stenosis.  4. The aortic valve is normal in structure. Aortic valve regurgitation is not visualized. No aortic stenosis is present.  5. The inferior vena cava is normal in size with greater than 50% respiratory variability, suggesting right atrial pressure of 3 mmHg.  6. No valve vegetation noted. FINDINGS  Left Ventricle: Left ventricular ejection fraction, by estimation, is 60 to 65%. The left ventricle has normal function. The left ventricle has no regional wall motion abnormalities. The left ventricular internal cavity size was normal in size. There is  no left ventricular hypertrophy. Left ventricular diastolic parameters were normal. Right Ventricle: The right ventricular size is normal. No increase in right ventricular wall thickness. Right ventricular systolic function is normal. Left Atrium: Left atrial size was normal in size. Right Atrium: Right atrial size was normal in size. Pericardium: There is no evidence of pericardial effusion. Mitral Valve: The mitral valve is normal in structure. Mild mitral valve regurgitation. No evidence of mitral valve stenosis. Tricuspid Valve: The tricuspid valve is normal in structure. Tricuspid valve regurgitation is not demonstrated. No evidence of tricuspid stenosis. Aortic Valve: The aortic valve is normal in structure. Aortic valve regurgitation is not visualized. No aortic stenosis is present. Pulmonic Valve: The pulmonic valve was normal in structure. Pulmonic valve  regurgitation is not visualized. No evidence of pulmonic stenosis. Aorta: The aortic root is normal in size and structure. Venous: The inferior vena cava is normal in size with greater than 50% respiratory variability, suggesting right atrial pressure of  3 mmHg. IAS/Shunts: The interatrial septum appears to be lipomatous. No atrial level shunt detected by color flow Doppler.  LEFT VENTRICLE PLAX 2D LVIDd:         3.50 cm   Diastology LVIDs:         2.40 cm   LV e' medial:    9.57 cm/s LV PW:         1.00 cm   LV E/e' medial:  11.5 LV IVS:        1.00 cm   LV e' lateral:   16.27 cm/s LVOT diam:     2.00 cm   LV E/e' lateral: 6.8 LV SV:         58 LV SV Index:   27 LVOT Area:     3.14 cm  RIGHT VENTRICLE             IVC RV Basal diam:  3.70 cm     IVC diam: 1.60 cm RV S prime:     16.67 cm/s TAPSE (M-mode): 2.8 cm LEFT ATRIUM             Index        RIGHT ATRIUM           Index LA diam:        4.70 cm 2.23 cm/m   RA Area:     16.50 cm LA Vol (A2C):   43.0 ml 20.43 ml/m  RA Volume:   47.80 ml  22.71 ml/m LA Vol (A4C):   33.4 ml 15.87 ml/m LA Biplane Vol: 39.1 ml 18.58 ml/m  AORTIC VALVE LVOT Vmax:   109.33 cm/s LVOT Vmean:  74.333 cm/s LVOT VTI:    0.184 m  AORTA Ao Root diam: 4.00 cm MITRAL VALVE MV Area (PHT): 5.02 cm     SHUNTS MV Decel Time: 151 msec     Systemic VTI:  0.18 m MV E velocity: 110.00 cm/s  Systemic Diam: 2.00 cm MV A velocity: 88.40 cm/s MV E/A ratio:  1.24 Julien Nordmann MD Electronically signed by Julien Nordmann MD Signature Date/Time: 06/30/2022/8:05:27 AM    Final    PERIPHERAL VASCULAR CATHETERIZATION  Result Date: 06/29/2022 See surgical note for result.  MR FOOT RIGHT W WO CONTRAST  Result Date: 06/27/2022 CLINICAL DATA:  Foot swelling, diabetic, osteomyelitis suspected, xray done EXAM: MRI OF THE RIGHT FOREFOOT WITHOUT AND WITH CONTRAST TECHNIQUE: Multiplanar, multisequence MR imaging of the right forefoot was performed before and after the administration of intravenous  contrast. CONTRAST:  9mL GADAVIST GADOBUTROL 1 MMOL/ML IV SOLN COMPARISON:  X-ray 06/26/2022 FINDINGS: Bones/Joint/Cartilage Bone marrow edema and enhancement with intermediate T1 marrow signal within the second metatarsal head and second toe proximal phalanx. Small-moderate second MTP joint effusion. Prominent bone marrow edema with intermediate T1 marrow signal within the base of the third toe proximal phalanx. There is also bone marrow edema and enhancement within the third metatarsal head. Small third MTP joint effusion. Remaining osseous structures are otherwise intact without significant marrow signal abnormality. No fracture or dislocation. Ligaments Intact Lisfranc ligament.  Intact collateral ligaments. Muscles and Tendons Flexor tenosynovitis of the second digit within the distal forefoot (series 9, images 16-17). There is also tenosynovitis of the second digit extensor tendon at the level of the second metatarsal neck. Denervation changes of the foot musculature. Soft tissues Plantar wound or ulceration underlying the second MTP joint. Extensive surrounding soft tissue edema. Poor enhancement of the soft tissues at this location is concerning for tissue necrosis (  series 13, images 15-17). No drainable fluid collection. IMPRESSION: 1. Plantar wound or ulceration underlying the second MTP joint. Extensive surrounding soft tissue edema compatible with cellulitis. Poor enhancement of the soft tissues at this location is concerning for tissue necrosis. No drainable fluid collection. 2. Acute osteomyelitis of the second metatarsal head and second toe proximal phalanx. Small-moderate second MTP joint effusion is suggestive of septic arthritis. 3. Acute osteomyelitis of the third metatarsal head and base of the third toe proximal phalanx. Small third MTP joint effusion, also concerning for septic arthritis. 4. Flexor tenosynovitis of the second digit within the distal forefoot. There is also tenosynovitis of the  second digit extensor tendon at the level of the second metatarsal neck. Electronically Signed   By: Duanne Guess D.O.   On: 06/27/2022 08:25   DG Foot Complete Right  Result Date: 06/26/2022 CLINICAL DATA:  Second toe ulcer, initial encounter EXAM: RIGHT FOOT COMPLETE - 3+ VIEW COMPARISON:  None Available. FINDINGS: Soft tissue ulcer is noted near the second MTP joint. No erosive changes are identified to suggest osteomyelitis. Tiny densities are noted adjacent to the soft tissue wound. These may represent small foreign bodies. IMPRESSION: Soft tissue wound without bony erosive change. Question small foreign bodies within the wound. Electronically Signed   By: Alcide Clever M.D.   On: 06/26/2022 20:13   US Venous Img Lower Unilateral Right  Result Date: 06/26/2022 CLINICAL DATA:  Right lower extremity pain EXAM: RIGHT LOWER EXTREMITY VENOUS DOPPLER ULTRASOUND TECHNIQUE: Gray-scale sonography with compression, as well as color and duplex ultrasound, were performed to evaluate the deep venous system(s) from the level of the common femoral vein through the popliteal and proximal calf veins. COMPARISON:  None Available. FINDINGS: VENOUS Normal compressibility of the common femoral, superficial femoral, and popliteal veins, as well as the visualized calf veins. Visualized portions of profunda femoral vein and great saphenous vein unremarkable. No filling defects to suggest DVT on grayscale or color Doppler imaging. Doppler waveforms show normal direction of venous flow, normal respiratory plasticity and response to augmentation. Limited views of the contralateral common femoral vein are unremarkable. OTHER Several prominent lymph nodes in the right groin. Limitations: none IMPRESSION: 1.  Negative for DVT in the right lower extremity. 2.  Several nonspecific prominent lymph nodes in the right groin. Electronically Signed   By: Emmaline Kluver M.D.   On: 06/26/2022 19:53     Assessment and plan-   #  Acute right lower extremity ischemia due to severe peripheral artery disease Status post angiogram, stenting of the right SFA and popliteal artery, thrombectomy developed reocclusion Status post overnight tPA Status post repeat angiogram of right lower extremity and mechanical thrombectomy and stenting. History of recurrent stroke, he has had hypercoagulable workup in the past, which were reviewed heterozygous prothrombin gene mutation.  Noncompliant with Eliquis and antiplatelet therapy.  He does have multiple other risk factors including diabetes, hypertension, hyperlipidemia, history of MI, history of substance use, smoking. Currently he is on Aggrastat post procedure.   Pulse check pending pain control Regarding to hypercoagulable workup, I will hold off repeating in the acute setting.  I will repeat cardiolipin antibodies and beta-2 glycoprotein antibodies outpatient. Recommend discharge on Eliquis and antiplatelet agents.  Thank you for allowing me to participate in the care of this patient.   Rickard Patience, MD, PhD Hematology Oncology 09/15/2022

## 2022-09-15 NOTE — Plan of Care (Signed)
°  Problem: Coping: °Goal: Level of anxiety will decrease °Outcome: Progressing °  °

## 2022-09-15 NOTE — Progress Notes (Signed)
Pt transported to special procedures by transport and ICU RN at this time.

## 2022-09-15 NOTE — Progress Notes (Addendum)
PROGRESS NOTE   HPI was taken from Dr. Huel Cote: Martin Mullen is a 42 y.o. 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   Martin Mullen states that he noticed that his right foot was becoming more painful over the last 2 days, especially with standing.  He denies noticing any swelling in his legs though.  He has noticed some drainage, but states it did not look purulent.  He denies any fever, chills, nausea, vomiting, diarrhea, shortness of breath, chest pain, palpitations.  He notes that he has not been taking his Eliquis for approximately 3 months now.  When asked him why he discontinued, he stated he no longer wanted to take it but did not have any particular reason for that.   Per chart review, patient was seen at the wound care center today.  At the time, it was noted that his left second toe amputation site looks significantly worse with cyanosis.  This was noted to be a change compared to 2 weeks prior.  Due to this, he was instructed to go to the ED.   ED course: On arrival to the ED, patient was normotensive at 126/86 with heart rate of 115.  He was saturating at 98% on room air.  He was afebrile at 98.7.  Initial workup notable for WBC of 15.5, hemoglobin of 17.1, platelets of 238, sodium of 134, glucose of 288, creatinine 1.01 with GFR above 60.  Vascular surgery was consulted and patient started on IV heparin.  [facility] contacted for admission.  7/30: S/p angio and stenting on the right foot.  Status post thrombectomy.  Had a abrupt change in his pulse exam last night suggestive of foot ischemia.  Likely failure of Aggrastat per vascular 7/31: Plan for repeat angio and thrombolysis with continuous  tPA infusion today 8/1: Angio and stenting of the right LE, mechanical thrombectomy   Martin Mullen  ZOX:[id number] DOB: 06-Oct-1980 DOA: 09/12/2022 PCP: [name], NP    Assessment & Plan:   Principal Problem:   Acute lower limb ischemia Active Problems:   Diabetic foot ulcer associated with type 2 diabetes mellitus (HCC)   Recurrent strokes (HCC)   Uncontrolled type 2 diabetes mellitus with hyperglycemia, with long-term current use of insulin (HCC)   HTN (hypertension)   CAD S/P percutaneous coronary angioplasty   Arterial occlusion  Assessment and Plan: Acute right lower limb ischemia: arterial duplex obtained that demonstrates severe atheromatous plaque of the distal right SFA in addition to occlusion of the right popliteal artery stent, although posterior tibial artery remains patent. Likely secondary to noncompliance w/ eliquis. Holding eliquis. Continue on IV heparin. Oxycodone, morphine prn for pain. Continue w/ neurovascular checks  -S/p angio with stenting of the right superficial femoral and popliteal artery.  Status post thrombectomy on 7/30.  Overnight he had absent pulses from the knee down on Doppler.  Foot was becoming cyanotic and cold -Repeat angio today of right lower extremity with mechanical thrombectomy and stenting   Diabetic foot ulcer:  involving the second right toe that has been since amputated. Remains an ulcer at his amputation site, however there is no evidence of infection. Likely secondary to poor wound healing    Recurrent strokes: secondary to heterozygous prothrombin gene 20210A  mutation. Has been on chronic Eliquis, but self discontinued 3 months ago. Holding eliquis. Continue on IV heparin    DM2: poorly controlled, HbA1c 7.7. Continue on glargine, SSI w/ accuchecks   Hx of CAD: s/p percutaneous coronary angioplasty. Has not been taking eliquis at home. Holding eliquis. Continue on IV heparin and aspirin, statin    HTN: continue on home dose of  losartan   Pending consult palliative care for goals of care discussion -he remains at very high risk for vascular complications including ischemia and death   DVT prophylaxis: IV heparin  Code Status: full  Family Communication:  Disposition Plan: likely d/c back home   Level of care: ICU Status is: Inpatient Remains inpatient appropriate because: severity of illness, requiring IV heparin     Consultants:  Vasc surg   Procedures: Angiogram and stenting on 7/30.  Repeat angiogram on 8/1   Subjective:  Hoping to get his right lower extremity fixed with repeat angio but does realize he may end up losing his limb  Objective: Vitals:   09/15/22 1300 09/15/22 1400 09/15/22 1500 09/15/22 1600  BP: 121/74 99/73 90/67  94/67  Pulse: (!) 117 (!) 112 (!) 118 61  Resp: 15 15 14 14   Temp: (!) 101.5 F (38.6 C)   99 F (37.2 C)  TempSrc: Oral   Axillary  SpO2: 94% 93% 94% 91%  Weight:      Height:        Intake/Output Summary (Last 24 hours) at 09/15/2022 1653 Last data filed at 09/15/2022 1611 Gross per 24 hour  Intake 1449.31 ml  Output 2035 ml  Net -585.69 ml   Filed Weights   09/12/22 1613 09/12/22 1616 09/14/22 1324  Weight: 86.2 kg 86.2 kg 86.2 kg    Examination:  General exam: Appears calm and comfortable  Respiratory system: clear breath sounds b/l Cardiovascular system: S1 & S2 +. No rubs, gallops or clicks. Gastrointestinal system: Abdomen is nondistended, soft and nontender. Hypoactive bowel sounds heard. Central nervous system: Awake and alert, nonfocal Extremities: Absent pulses knee down with ischemic changes of the right lower extremity below knee Psychiatry: Normal mood and affect    Data Reviewed: I have personally reviewed following labs and imaging studies  CBC: Recent Labs  Lab 09/14/22 0511 09/14/22 1341 09/14/22 1947 09/15/22 0034 09/15/22 0626  WBC 11.8* 10.1 14.2* 16.9* 15.9*  HGB 14.3 15.0 15.0 14.1 14.2  HCT 40.8 43.2 43.8 40.3 41.0   MCV 87.4 90.0 90.1 89.2 88.2  PLT 220 193 179 163 168   Basic Metabolic Panel: Recent Labs  Lab 09/12/22 1617 09/13/22 0647 09/14/22 0511 09/15/22 0626  NA 134* 136 133* 131*  K 3.9 3.4* 3.8 4.0  CL 97* 102 103 98  CO2 27 24 24 22   GLUCOSE 288* 106* 147* 175*  BUN 10 7 9 7   CREATININE 1.01 0.75 0.81 0.78  CALCIUM 9.0 8.6* 8.2* 8.4*   GFR: Estimated Creatinine Clearance: 128.1 mL/min (by C-G formula based on SCr of 0.78 mg/dL). Liver Function Tests: Recent Labs  Lab 09/12/22 1617  AST 11*  ALT 10  ALKPHOS 128*  BILITOT 0.4  PROT 7.8  ALBUMIN 4.0    CBG: Recent Labs  Lab 09/14/22 1634 09/14/22 2119 09/15/22 0738 09/15/22 1222 09/15/22 1300  GLUCAP 105* 123* 156* 141* 172*     Recent Results (from the past 240 hour(s))  MRSA Next Gen by PCR, Nasal     Status: None   Collection Time: 09/13/22 12:14 PM  Specimen: Nasal Mucosa; Nasal Swab  Result Value Ref Range Status   MRSA by PCR Next Gen NOT DETECTED NOT DETECTED Final    Comment: (NOTE) The GeneXpert MRSA Assay (FDA approved for NASAL specimens only), is one component of a comprehensive MRSA colonization surveillance program. It is not intended to diagnose MRSA infection nor to guide or monitor treatment for MRSA infections. Test performance is not FDA approved in patients less than 34 years old. Performed at [facility], [address] [place], [place] [address]          Radiology Studies: PERIPHERAL VASCULAR CATHETERIZATION  Result Date: 09/15/2022 See surgical note for result.  PERIPHERAL VASCULAR CATHETERIZATION  Result Date: 09/14/2022 See surgical note for result.       Scheduled Meds:  Chlorhexidine Gluconate Cloth  6 each Topical Daily   insulin aspart  0-15 Units Subcutaneous TID WC   sodium chloride flush  3 mL Intravenous Q12H   Continuous Infusions:  sodium chloride     sodium chloride 75 mL/hr at 09/15/22 1611   tirofiban 0.15 mcg/kg/min (09/15/22 1611)      LOS: 3 days   Time spent 25 minutes    Delfino Lovett, MD [facility] Pager [phone]  If 7PM-7AM, please contact night-coverage [email or web address] 09/15/2022, 4:53 PM

## 2022-09-15 NOTE — Interval H&P Note (Signed)
History and Physical Interval Note:  09/15/2022 10:06 AM  Martin Mullen  has presented today for surgery, with the diagnosis of PAD.  The various methods of treatment have been discussed with the patient and family. After consideration of risks, benefits and other options for treatment, the patient has consented to  Procedure(s): Lower Extremity Angiography (Right) as a surgical intervention.  The patient's history has been reviewed, patient examined, no change in status, stable for surgery.  I have reviewed the patient's chart and labs.  Questions were answered to the patient's satisfaction.     [name]

## 2022-09-15 NOTE — Progress Notes (Signed)
Pt returned to unit at this time. VSS on room air. Some old blood noted to pad under pt. Dressing in place on left groin site.

## 2022-09-15 NOTE — Progress Notes (Signed)
Palliative-   Consult received, chart reviewed.  Patient admitted with ischemic R lower extremity. Underwent thrombolysis by vascular surgery today.  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 He is on chronic Eliquis but per chart review has not taken it for 3 months.   Palliative consulted for goals of care.   He has HCPOA document on chart designating [employer] as POA. Advance Directives are not completed.    Palliative will attempt to see patient at a later time after he has had time for his mental status to clear from anesthesia.   Ocie Bob, AGNP-C Palliative Medicine  No charge

## 2022-09-16 ENCOUNTER — Encounter: Payer: Self-pay | Admitting: Vascular Surgery

## 2022-09-16 DIAGNOSIS — I2510 Atherosclerotic heart disease of native coronary artery without angina pectoris: Secondary | ICD-10-CM | POA: Diagnosis not present

## 2022-09-16 DIAGNOSIS — I998 Other disorder of circulatory system: Secondary | ICD-10-CM | POA: Diagnosis not present

## 2022-09-16 DIAGNOSIS — Z7189 Other specified counseling: Secondary | ICD-10-CM

## 2022-09-16 DIAGNOSIS — I639 Cerebral infarction, unspecified: Secondary | ICD-10-CM | POA: Diagnosis not present

## 2022-09-16 DIAGNOSIS — E11621 Type 2 diabetes mellitus with foot ulcer: Secondary | ICD-10-CM | POA: Diagnosis not present

## 2022-09-16 DIAGNOSIS — I7090 Unspecified atherosclerosis: Secondary | ICD-10-CM | POA: Diagnosis not present

## 2022-09-16 DIAGNOSIS — E1151 Type 2 diabetes mellitus with diabetic peripheral angiopathy without gangrene: Secondary | ICD-10-CM

## 2022-09-16 LAB — URINALYSIS, W/ REFLEX TO CULTURE (INFECTION SUSPECTED)
Bilirubin Urine: NEGATIVE
Glucose, UA: NEGATIVE mg/dL
Ketones, ur: 20 mg/dL — AB
Leukocytes,Ua: NEGATIVE
Nitrite: NEGATIVE
Protein, ur: 30 mg/dL — AB
Specific Gravity, Urine: 1.017 (ref 1.005–1.030)
pH: 5 (ref 5.0–8.0)

## 2022-09-16 LAB — CBC WITH DIFFERENTIAL/PLATELET
Abs Immature Granulocytes: 0.03 10*3/uL (ref 0.00–0.07)
Basophils Absolute: 0.1 10*3/uL (ref 0.0–0.1)
Basophils Relative: 1 %
Eosinophils Absolute: 0.1 10*3/uL (ref 0.0–0.5)
Eosinophils Relative: 1 %
HCT: 34.4 % — ABNORMAL LOW (ref 39.0–52.0)
Hemoglobin: 12.1 g/dL — ABNORMAL LOW (ref 13.0–17.0)
Immature Granulocytes: 0 %
Lymphocytes Relative: 18 %
Lymphs Abs: 1.8 10*3/uL (ref 0.7–4.0)
MCH: 31.2 pg (ref 26.0–34.0)
MCHC: 35.2 g/dL (ref 30.0–36.0)
MCV: 88.7 fL (ref 80.0–100.0)
Monocytes Absolute: 0.7 10*3/uL (ref 0.1–1.0)
Monocytes Relative: 7 %
Neutro Abs: 7.5 10*3/uL (ref 1.7–7.7)
Neutrophils Relative %: 73 %
Platelets: 162 10*3/uL (ref 150–400)
RBC: 3.88 MIL/uL — ABNORMAL LOW (ref 4.22–5.81)
RDW: 13.5 % (ref 11.5–15.5)
WBC: 10.2 10*3/uL (ref 4.0–10.5)
nRBC: 0 % (ref 0.0–0.2)

## 2022-09-16 LAB — PROTIME-INR
INR: 1.1 (ref 0.8–1.2)
Prothrombin Time: 14.5 seconds (ref 11.4–15.2)

## 2022-09-16 LAB — COMPREHENSIVE METABOLIC PANEL
ALT: 8 U/L (ref 0–44)
AST: 17 U/L (ref 15–41)
Albumin: 2.5 g/dL — ABNORMAL LOW (ref 3.5–5.0)
Alkaline Phosphatase: 84 U/L (ref 38–126)
Anion gap: 9 (ref 5–15)
BUN: 9 mg/dL (ref 6–20)
CO2: 22 mmol/L (ref 22–32)
Calcium: 7.8 mg/dL — ABNORMAL LOW (ref 8.9–10.3)
Chloride: 98 mmol/L (ref 98–111)
Creatinine, Ser: 0.89 mg/dL (ref 0.61–1.24)
GFR, Estimated: >60 mL/min (ref >60)
Glucose, Bld: 152 mg/dL — ABNORMAL HIGH (ref 70–99)
Potassium: 3.8 mmol/L (ref 3.5–5.1)
Sodium: 129 mmol/L — ABNORMAL LOW (ref 135–145)
Total Bilirubin: 0.5 mg/dL (ref 0.3–1.2)
Total Protein: 6 g/dL — ABNORMAL LOW (ref 6.5–8.1)

## 2022-09-16 LAB — LACTIC ACID, PLASMA
Lactic Acid, Venous: 0.8 mmol/L (ref 0.5–1.9)
Lactic Acid, Venous: 1.4 mmol/L (ref 0.5–1.9)

## 2022-09-16 LAB — PROCALCITONIN: Procalcitonin: 0.28 ng/mL

## 2022-09-16 LAB — GLUCOSE, CAPILLARY
Glucose-Capillary: 155 mg/dL — ABNORMAL HIGH (ref 70–99)
Glucose-Capillary: 95 mg/dL (ref 70–99)
Glucose-Capillary: 138 mg/dL — ABNORMAL HIGH (ref 70–99)
Glucose-Capillary: 92 mg/dL (ref 70–99)

## 2022-09-16 LAB — APTT: aPTT: 31 seconds (ref 24–36)

## 2022-09-16 LAB — CORTISOL: Cortisol, Plasma: 9.8 ug/dL

## 2022-09-16 MED ORDER — METRONIDAZOLE 500 MG/100ML IV SOLN
500.0000 mg | Freq: Two times a day (BID) | INTRAVENOUS | Status: AC
  Administered 2022-09-16 – 2022-09-22 (×13): 500 mg via INTRAVENOUS
  Filled 2022-09-16 (×14): qty 100

## 2022-09-16 MED ORDER — LACTATED RINGERS IV BOLUS (SEPSIS)
1000.0000 mL | Freq: Once | INTRAVENOUS | Status: AC
  Administered 2022-09-16: 1000 mL via INTRAVENOUS

## 2022-09-16 MED ORDER — VANCOMYCIN HCL 1750 MG/350ML IV SOLN: 1750.0000 mg | Freq: Two times a day (BID) | INTRAVENOUS | Status: DC

## 2022-09-16 MED ORDER — SODIUM CHLORIDE 0.9 % IV SOLN
2.0000 g | Freq: Three times a day (TID) | INTRAVENOUS | Status: DC
  Filled 2022-09-16: qty 12.5

## 2022-09-16 MED ORDER — HEPARIN (PORCINE) 25000 UT/250ML-% IV SOLN
2650.0000 [IU]/h | INTRAVENOUS | Status: DC
  Administered 2022-09-16: 1550 [IU]/h via INTRAVENOUS
  Administered 2022-09-17: 1850 [IU]/h via INTRAVENOUS
  Administered 2022-09-18 (×2): 2200 [IU]/h via INTRAVENOUS
  Administered 2022-09-19 – 2022-09-20 (×2): 2500 [IU]/h via INTRAVENOUS
  Filled 2022-09-16 (×9): qty 250

## 2022-09-16 MED ORDER — SODIUM CHLORIDE 0.9 % IV SOLN
2.0000 g | Freq: Once | INTRAVENOUS | Status: DC
  Filled 2022-09-16: qty 12.5

## 2022-09-16 MED ORDER — VANCOMYCIN HCL IN DEXTROSE 1-5 GM/200ML-% IV SOLN
1000.0000 mg | INTRAVENOUS | Status: DC
  Administered 2022-09-16: 1000 mg via INTRAVENOUS
  Filled 2022-09-16: qty 200

## 2022-09-16 MED ORDER — VANCOMYCIN HCL 1500 MG/300ML IV SOLN
1500.0000 mg | Freq: Two times a day (BID) | INTRAVENOUS | Status: AC
  Administered 2022-09-17 – 2022-09-22 (×13): 1500 mg via INTRAVENOUS
  Filled 2022-09-16 (×15): qty 300

## 2022-09-16 MED ORDER — LACTATED RINGERS IV SOLN
150.0000 mL/h | INTRAVENOUS | Status: AC
  Administered 2022-09-16 – 2022-09-17 (×2): 150 mL/h via INTRAVENOUS

## 2022-09-16 MED ORDER — SODIUM CHLORIDE 0.9 % IV SOLN
2.0000 g | Freq: Three times a day (TID) | INTRAVENOUS | Status: AC
  Administered 2022-09-16 – 2022-09-22 (×18): 2 g via INTRAVENOUS
  Filled 2022-09-16 (×20): qty 12.5

## 2022-09-16 NOTE — Progress Notes (Signed)
ANTICOAGULATION CONSULT NOTE - Initial Consult  Pharmacy Consult for heparin Indication:  arterial occlusion of RLE, hx PAD  No Known Allergies  Patient Measurements: Height: 5\' 11"  (180.3 cm) Weight: 86.2 kg (190 lb 0.6 oz) IBW/kg (Calculated) : 75.3 Heparin Dosing Weight: 86.2 kg  Vital Signs: Temp: 100.9 F (38.3 C) (08/02 1156) Temp Source: Oral (08/02 1156) BP: 125/84 (08/02 1300) Pulse Rate: 114 (08/02 1300)  Labs: Recent Labs    09/14/22 0511 09/14/22 1341 09/15/22 0034 09/15/22 0626 09/16/22 0455  HGB 14.3   < > 14.1 14.2 13.3  HCT 40.8   < > 40.3 41.0 37.0*  PLT 220   < > 163 168 184  CREATININE 0.81  --   --  0.78  --    < > = values in this interval not displayed.    Estimated Creatinine Clearance: 128.1 mL/min (by C-G formula based on SCr of 0.78 mg/dL).   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   Chicken pox    Coronary artery disease    Inferior ST elevation myocardial infarction in July of 2014. Cardiac catheterization showed 95% distal RCA stenosis and 90% first diagonal stenosis with normal ejection fraction. He underwent PCI in bare-metal stent placement to the distal RCA.   Diabetes mellitus without complication (HCC)    Hypercholesterolemia    Stroke (HCC)    Tobacco use    Assessment: 42 y/o male presenting from outpatient wound care center due to pain and cyanotic vasculature in his right leg. Pharmacy has been consulted to dose heparin. 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   Goal of Therapy:  Heparin level 0.3-0.7 units/ml Monitor platelets by anticoagulation protocol: Yes   Plan:  Resume heparin infusion at 1550 units/hr  Check heparin level in 6 hrs  Check daily CBC and signs/symptoms of bleeding.  Lowella Bandy, PharmD 09/16/2022  3:25 PM

## 2022-09-16 NOTE — Progress Notes (Signed)
CODE SEPSIS - PHARMACY COMMUNICATION  **Broad Spectrum Antibiotics should be administered within 1 hour of Sepsis diagnosis**  Time Code Sepsis Called/Page Received: 1602  Antibiotics Ordered: cefepime   (already on vanc)  Time of 1st antibiotic administration: 1622  Additional action taken by pharmacy:    If necessary, Name of Provider/Nurse Contacted:      Angelique Blonder ,PharmD Clinical Pharmacist  09/16/2022  4:35 PM

## 2022-09-16 NOTE — Consult Note (Signed)
NAME: Martin Mullen  DOB: Dec 24, 1980  MRN: [id number]  Date/Time: 09/16/2022 3:38 PM  REQUESTING PROVIDER: Dr.Shah Subjective:  REASON FOR CONSULT: fever ? Martin Mullen is a 42 y.o. 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  TEE was negative at that time.  He was followed at the wound clinic presents to the ED on 09/12/2022 with concerns of circulatory problem.  The wound clinic had seen him that day and the second toe amputation site of his left foot looks significantly worse with cyanosis and he was asked to go to the ED.  Right foot becoming more painful and swelling in his legs.  He did not have any fever chills nausea vomiting or diarrhea.    Vitals in the ED on 09/12/2022 was BP of 126/86, temperature 98.7, heart rate of 115, sats of 98% and respiratory rate of 19. Labs revealed a WBC of 15.5, Hb 17.1, platelet 238 and creatinine 1.01. He underwent angiogram on 09/13/2022 for atherosclerotic occlusive disease bilateral lower extremities with rest pain of the right foot and ischemia right lower extremity with thrombosis of previously placed stent.  Had a thrombectomy of the right distal popliteal and right posterior tibial artery.  An atherectomy of the right SFA and popliteal the previous stent site.And angioplasty and stent placement of the right superficial femoral artery and right popliteal artery He also received overnight thrombolytic therapy. He was taken back for angiogram the next day 09/15/2022 and underwent mechanical thrombectomy of the right SFA, popliteal artery, tibioperoneal trunk and posterior tibial artery.  And a stent  placement to the right SFA.  And angioplasty of the right posterior tibial artery for residual stenosis and thrombosis after thrombectomy Today he had a fever of 100.4 and I am asked to see the patient He has been given a dose of vancomycin but no cultures have been sent yet  Past Medical History:  Diagnosis Date   Acute transmural inferior wall MI (HCC) 08/28/2012   .5 x 12 mm Veri-flex stent non-DES.   Chicken pox    Coronary artery disease    Inferior ST elevation myocardial infarction in July of 2014. Cardiac catheterization showed 95% distal RCA stenosis and 90% first diagonal stenosis with normal ejection fraction. He underwent PCI in bare-metal stent placement to the distal RCA.   Diabetes mellitus without complication (HCC)    Hypercholesterolemia    Stroke [facility])    Tobacco use     Past Surgical History:  Procedure Laterality Date   AMPUTATION TOE Right 06/27/2022   Procedure: AMPUTATION TOE RIGHT SECOND TOE;  Surgeon: Candelaria Stagers, DPM;  Location: [facility];  Service: Podiatry;  Laterality: Right;   CORONARY ANGIOPLASTY WITH STENT PLACEMENT     DEBRIDEMENT AND CLOSURE WOUND N/A 09/03/2019   Procedure: Debridement and closure of scrotal wound;  Surgeon: Allena Napoleon, MD;  Location: [facility];  Service: Plastics;  Laterality: N/A;   DEBRIDEMENT AND CLOSURE WOUND N/A 07/25/2019   Procedure: DEBRIDEMENT AND CLOSURE WOUND;  Surgeon: Allena Napoleon, MD;  Location: [facility];  Service: Plastics;  Laterality: N/A;   INCISION AND DRAINAGE Right 06/27/2022  Procedure: INCISION AND DRAINAGE;  Surgeon: Candelaria Stagers, DPM;  Location: [facility];  Service: Podiatry;  Laterality: Right;   INCISION AND DRAINAGE ABSCESS N/A 07/20/2019   Procedure: INCISION AND DRAINAGE ABSCESS;  Surgeon: Crista Elliot, MD;  Location: [facility];  Service: Urology;  Laterality: N/A;   INCISION AND DRAINAGE OF WOUND Right 06/30/2022   Procedure: IRRIGATION AND DEBRIDEMENT WOUND WITH CLOSURE;   Surgeon: Candelaria Stagers, DPM;  Location: [facility];  Service: Podiatry;  Laterality: Right;   LEFT HEART CATHETERIZATION WITH CORONARY ANGIOGRAM N/A 08/28/2012   Procedure: LEFT HEART CATHETERIZATION WITH CORONARY ANGIOGRAM;  Surgeon: Pamella Pert, MD;  Location: [facility];  Service: Cardiovascular;  Laterality: N/A;   LOWER EXTREMITY ANGIOGRAPHY Right 06/29/2022   Procedure: Lower Extremity Angiography;  Surgeon: Annice Needy, MD;  Location: [facility];  Service: Cardiovascular;  Laterality: Right;   LOWER EXTREMITY ANGIOGRAPHY Right 09/13/2022   Procedure: Lower Extremity Angiography;  Surgeon: Renford Dills, MD;  Location: [facility];  Service: Cardiovascular;  Laterality: Right;   LOWER EXTREMITY ANGIOGRAPHY Right 09/14/2022   Procedure: Lower Extremity Angiography;  Surgeon: Renford Dills, MD;  Location: [facility];  Service: Cardiovascular;  Laterality: Right;   LOWER EXTREMITY ANGIOGRAPHY Right 09/15/2022   Procedure: Lower Extremity Angiography;  Surgeon: Annice Needy, MD;  Location: [facility];  Service: Cardiovascular;  Laterality: Right;   TEE WITHOUT CARDIOVERSION N/A 09/23/2020   Procedure: TRANSESOPHAGEAL ECHOCARDIOGRAM (TEE);  Surgeon: Lamar Blinks, MD;  Location: [facility];  Service: Cardiovascular;  Laterality: N/A;   TEE WITHOUT CARDIOVERSION N/A 07/01/2022   Procedure: TRANSESOPHAGEAL ECHOCARDIOGRAM;  Surgeon: Antonieta Iba, MD;  Location: [facility];  Service: Cardiovascular;  Laterality: N/A;   WRIST SURGERY  1988    Social History   Socioeconomic History   Marital status: Divorced    Spouse name: Not on file   Number of children: 2   Years of education: Not on file   Highest education level: Not on file  Occupational History   Not on file  Tobacco Use   Smoking status: Every Day    Current packs/day: 1.00    Average packs/day: 1 pack/day for 30.0 years (30.0 ttl pk-yrs)    Types: Cigarettes   Smokeless  tobacco: Never  Vaping Use   Vaping status: Never Used  Substance and Sexual Activity   Alcohol use: Not Currently    Alcohol/week: 0.0 standard drinks of alcohol    Comment: occasional   Drug use: Not Currently    Types: Marijuana   Sexual activity: Yes    Birth control/protection: None  Other Topics Concern   Not on file  Social History Narrative   Lives with girlfriend and her mother    Social Determinants of Health   Financial Resource Strain: Low Risk  (06/10/2022)   Overall Financial Resource Strain (CARDIA)    Difficulty of Paying Living Expenses: Not very hard  Food Insecurity: No Food Insecurity (06/29/2022)   Hunger Vital Sign    Worried About Running Out of Food in the Last Year: Never true    Ran Out of Food in the Last Year: Never true  Transportation Needs: No Transportation Needs (06/29/2022)   PRAPARE - Administrator, Civil Service (Medical): No    Lack of Transportation (Non-Medical): No  Physical Activity: Not on file  Stress: Not on file  Social Connections: Not on file  Intimate Partner  Violence: Not At Risk (06/29/2022)   Humiliation, Afraid, Rape, and Kick questionnaire    Fear of Current or Ex-Partner: No    Emotionally Abused: No    Physically Abused: No    Sexually Abused: No    Family History  Problem Relation Age of Onset   Arthritis Mother        Rheumatoid   Diabetes Mother    Hyperlipidemia Mother    Diabetes Father    Cancer Maternal Grandfather        Prostate   Parkinson's disease Maternal Grandfather    No Known Allergies I? Current Facility-Administered Medications  Medication Dose Route Frequency Provider Last Rate Last Admin   0.9 %  sodium chloride infusion  250 mL Intravenous PRN Wyn Quaker, Marlow Baars, MD       0.9 %  sodium chloride infusion   Intravenous Continuous Annice Needy, MD 75 mL/hr at 09/16/22 1300 Infusion Verify at 09/16/22 1300   acetaminophen (TYLENOL) tablet 650 mg  650 mg Oral Q6H PRN Delfino Lovett, MD   650  mg at 09/15/22 2122   Chlorhexidine Gluconate Cloth 2 % PADS 6 each  6 each Topical Daily Annice Needy, MD   6 each at 09/14/22 2155   heparin ADULT infusion 100 units/mL (25000 units/228mL)  1,550 Units/hr Intravenous Continuous Lowella Bandy, RPH       hydrALAZINE (APRESOLINE) injection 5 mg  5 mg Intravenous Q20 Min PRN Annice Needy, MD       HYDROmorphone (DILAUDID) injection 1 mg  1 mg Intravenous Q2H PRN Manuela Schwartz, NP   1 mg at 09/16/22 1454   insulin aspart (novoLOG) injection 0-15 Units  0-15 Units Subcutaneous TID WC Annice Needy, MD   3 Units at 09/16/22 0820   labetalol (NORMODYNE) injection 10 mg  10 mg Intravenous Q10 min PRN Annice Needy, MD   10 mg at 09/14/22 2043   midazolam (VERSED) injection 1 mg  1 mg Intravenous Q1H PRN Annice Needy, MD       ondansetron [facility]) injection 4 mg  4 mg Intravenous Q6H PRN Annice Needy, MD   4 mg at 09/16/22 0331   oxyCODONE (Oxy IR/ROXICODONE) immediate release tablet 5 mg  5 mg Oral Q6H PRN Manuela Schwartz, NP   5 mg at 09/16/22 0331   sodium chloride flush (NS) 0.9 % injection 3 mL  3 mL Intravenous Q12H Annice Needy, MD   3 mL at 09/16/22 1000   sodium chloride flush (NS) 0.9 % injection 3 mL  3 mL Intravenous PRN Annice Needy, MD       vancomycin (VANCOCIN) IVPB 1000 mg/200 mL premix  1,000 mg Intravenous Q24H Delfino Lovett, MD 200 mL/hr at 09/16/22 1459 1,000 mg at 09/16/22 1459     Abtx:  Anti-infectives (From admission, onward)    Start     Dose/Rate Route Frequency Ordered Stop   09/16/22 1430  vancomycin (VANCOCIN) IVPB 1000 mg/200 mL premix        1,000 mg 200 mL/hr over 60 Minutes Intravenous Every 24 hours 09/16/22 1333     09/15/22 1030  ceFAZolin (ANCEF) IVPB 2g/100 mL premix        2 g 200 mL/hr over 30 Minutes Intravenous On call to O.R. 09/15/22 1028 09/15/22 1115   09/14/22 1012  ceFAZolin (ANCEF) IVPB 2g/100 mL premix        2 g 200 mL/hr over 30 Minutes Intravenous 30 min pre-op  09/14/22 1012 09/14/22 1053    09/13/22 1347  ceFAZolin (ANCEF) IVPB 2g/100 mL premix        2 g 200 mL/hr over 30 Minutes Intravenous 30 min pre-op 09/13/22 1347 09/13/22 1450      Patient does not give much history Does not talk much REVIEW OF SYSTEMS:  NA ? Pertinent Positives include : Objective:  VITALS:  BP 125/84   Pulse (!) 114   Temp (!) 100.9 F (38.3 C) (Oral)   Resp 12   Ht 5\' 11"  (1.803 m)   Wt 86.2 kg   SpO2 92%   BMI 26.50 kg/m   PHYSICAL EXAM:  General: Lethargic, very slow to respond Head: Normocephalic, without obvious abnormality, atraumatic. Eyes: Conjunctivae clear, anicteric sclerae. Pupils are equal ENT Nares normal. No drainage or sinus tenderness. Lips, mucosa, and tongue normal. No Thrush Neck: Supple, symmetrical, no adenopathy, thyroid: non tender no carotid bruit and no JVD. Lungs: Bilateral air entry Heart: Tachycardia  abdomen: Soft, non-tender,not distended. Bowel sounds normal. No masses Extremities: Right foot cyanotic Second toe amputation site is gangrenous  Skin: No rashes or lesions. Or bruising Lymph: Cervical, supraclavicular normal. Neurologic: Grossly non-focal Pertinent Labs Lab Results CBC    Component Value Date/Time   WBC 12.7 (H) 09/16/2022 0455   RBC 4.20 (L) 09/16/2022 0455   HGB 13.3 09/16/2022 0455   HGB 17.9 (H) 08/11/2021 0937   HCT 37.0 (L) 09/16/2022 0455   HCT 51.0 08/11/2021 0937   PLT 184 09/16/2022 0455   PLT 261 08/11/2021 0937   MCV 88.1 09/16/2022 0455   MCV 93 08/11/2021 0937   MCV 93 01/29/2013 0040   MCH 31.7 09/16/2022 0455   MCHC 35.9 09/16/2022 0455   RDW 13.4 09/16/2022 0455   RDW 12.4 08/11/2021 0937   RDW 13.8 01/29/2013 0040   LYMPHSABS 2.9 07/20/2022 1627   LYMPHSABS 4.8 (H) 08/11/2021 0937   MONOABS 0.5 07/20/2022 1627   EOSABS 0.2 07/20/2022 1627   EOSABS 0.5 (H) 08/11/2021 0937   BASOSABS 0.1 07/20/2022 1627   BASOSABS 0.1 08/11/2021 0937       Latest Ref Rng & Units 09/15/2022    6:26 AM 09/14/2022     5:11 AM 09/13/2022    6:47 AM  CMP  Glucose 70 - 99 mg/dL 761  607  371   BUN 6 - 20 mg/dL 7  9  7    Creatinine 0.61 - 1.24 mg/dL 0.62  6.94  8.54   Sodium 135 - 145 mmol/L 131  133  136   Potassium 3.5 - 5.1 mmol/L 4.0  3.8  3.4   Chloride 98 - 111 mmol/L 98  103  102   CO2 22 - 32 mmol/L 22  24  24    Calcium 8.9 - 10.3 mg/dL 8.4  8.2  8.6       Microbiology: Recent Results (from the past 240 hour(s))  MRSA Next Gen by PCR, Nasal     Status: None   Collection Time: 09/13/22 12:14 PM   Specimen: Nasal Mucosa; Nasal Swab  Result Value Ref Range Status   MRSA by PCR Next Gen NOT DETECTED NOT DETECTED Final    Comment: (NOTE) The GeneXpert MRSA Assay (FDA approved for NASAL specimens only), is one component of a comprehensive MRSA colonization surveillance program. It is not intended to diagnose MRSA infection nor to guide or monitor treatment for MRSA infections. Test performance is not FDA approved in patients less than 67 years old. Performed  at [facility], [address] [place], [place] [address]     IMAGING RESULTS: CT head no acute intracranial pathology Remote infarcts in the bilateral basal ganglia and right frontal lobe I have personally reviewed the films ? Impression/Recommendation ? Peripheral artery disease severe thrombosis of the right lower extremity status post thrombectomy, thrombolysis and repeat thrombectomy with stent placement  Cyanotic left right foot with gangrene  Encephalopathy  Fever likely due to the above Patient is on broad-spectrum antibiotics of Vanco cefepime and Flagyl for sepsis protocol He is going to need amputation Culture taken  Diabetes management as per primary team  History of CVA secondary to prothrombin gene mutation.  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

## 2022-09-16 NOTE — Sepsis Progress Note (Signed)
Code Sepsis protocol being monitored by eLink. 

## 2022-09-16 NOTE — Progress Notes (Addendum)
Pharmacy Antibiotic Note  Martin Mullen is a 42 y.o. male admitted on 09/12/2022 with sepsis and wound infection .  Pharmacy has been consulted for Cefepime and Vancomycin dosing.  -hx Diabetes, recurrent foot ulcers w/ amputations -Repeat angio of right lower extremity with mechanical thrombectomy and stenting on 8/1. Likely AKA next week?  -spiking low-grade fever 8/2  Plan: Will order Cefepime 2 gm IV q8h    Crcl 128 ml/min  Will adjust Vancomycin from 1 gram IV q24h to 1500 mg q12h  Goal AUC 400-550. Expected AUC: 483 SCr used: 0.89 Cmin 12.1   Will f/u renal fxn, cultures, etc   Height: 5\' 11"  (180.3 cm) Weight: 86.2 kg (190 lb 0.6 oz) IBW/kg (Calculated) : 75.3  Temp (24hrs), Avg:100 F (37.8 C), Min:98.9 F (37.2 C), Max:100.9 F (38.3 C)  Recent Labs  Lab 09/12/22 1617 09/13/22 0647 09/14/22 0511 09/14/22 1341 09/14/22 1947 09/15/22 0034 09/15/22 0626 09/16/22 0455  WBC 15.5* 12.0* 11.8* 10.1 14.2* 16.9* 15.9* 12.7*  CREATININE 1.01 0.75 0.81  --   --   --  0.78  --     Estimated Creatinine Clearance: 128.1 mL/min (by C-G formula based on SCr of 0.78 mg/dL).    No Known Allergies  Antimicrobials this admission: Cefazolin  7/30 >> 8/1 Vancomycin  8/2 >>   Cefepime 8/2>>  Dose adjustments this admission:    Microbiology results: 8/2 BCx: pendig 8/2 CAth tip cx: pending 8/2 foot cx: pending   UCx:      Sputum:    7/30 MRSA PCR: neg  Thank you for allowing pharmacy to be a part of this patient's care.  , A 09/16/2022 4:28 PM

## 2022-09-16 NOTE — Consult Note (Deleted)
CODE SEPSIS - PHARMACY COMMUNICATION  **Broad-spectrum antimicrobials should be administered within one hour of sepsis diagnosis**  Time Code Sepsis call or page was received: 1601  Antibiotics ordered: Cefepime, Metronidazole Patient already has scheduled vancomycin order active  Time of first antibiotic administration: 1622  Additional action taken by pharmacy: N/A  If necessary, name of provider/nurse contacted: N/A    Will M. Dareen Piano, PharmD Clinical Pharmacist 09/16/2022 4:07 PM

## 2022-09-16 NOTE — Progress Notes (Signed)
Progress Note    09/16/2022 9:57 AM 1 Day Post-Op  Subjective:  Martin Mullen is a 42 y.o. 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   Patient was taken to the vascular lab twice this week and had a lysis catheter placed to his right lower extremity.  It was then removed the next day with an attempt to revascularize his right lower extremity.  Unfortunately these attempts have been unsuccessful.  Patient's foot remains mottled cold to touch and without palpable or Doppler pulses.  Patient remains on a heparin drip currently and on pain medication.  Because of his current treatment and his prior history of CVAs I found it appropriate to contact the patient's power of attorney for future care.  Patient will require a right above-the-knee amputation.  I had a long detailed discussion with Martin Mullen the patient's power of attorney for his medical care due to his prior history of CVA.  We discussed the current situation and the inability to revascularize his lower right lower extremity.  I let her know we discussed with the patient he would need an amputation but he was unwilling at this time.  I impressed upon her that without any amputation at this time he would become septic and most likely lose his life to this.  She verbalizes her understanding.  She would not give consent today for right above-the-knee amputation without personally coming in speaking to the patient directly herself.  I completely agree with this.  Tabatha agrees to come and visit to speak with Martin Mullen on Sunday morning to discuss his medical options.  I gave Tabatha my cell phone number to call me afterwards once a decision has  been made.  If Martin Mullen gives Martin Mullen consent to move forward with a right above-the-knee amputation I will schedule the patient accordingly early next week.  Attorney Martin Mullen verbalizes her understanding again.  I will wait for her return phone call this weekend as to what the patient's wishes are and what the plan of care needs to be.  Currently the patient has been transferred to the floor and given a diet.  Patient will remain on a heparin infusion until further notice.  Patient will remain on pain medications to remain comfortable.   Vitals:   09/16/22 0700 09/16/22 0800  BP: (!) 149/92 (!) 208/171  Pulse: (!) 114 (!) 111  Resp: 12 14  Temp:  100.1 F (37.8 C)  SpO2: 95% 94%   Physical Exam: Cardiac:  RRR, with tachycardic Rate, normal S1, S2.  No murmurs appreciated Lungs: Clear throughout on auscultation, no rales, rhonchi or wheezing noted. Incisions: Left groin incision with erythema.  Dressing applied, clean dry and intact.  No hematoma noted Extremities: Lower extremity is warm to touch with palpable pulses.  Right lower extremity below the knee cool to touch with mottled purple foot and toes.  Without Doppler pulses. Abdomen: Positive bowel sounds throughout, soft, nontender and nondistended. Neurologic: Alert and oriented to 1, history of CVA x 3.  Patient unable to follow commands appropriately all the time.  CBC    Component Value Date/Time   WBC 12.7 (H) 09/16/2022 0455   RBC 4.20 (L) 09/16/2022 0455   HGB 13.3 09/16/2022 0455  HGB 17.9 (H) 08/11/2021 0937   HCT 37.0 (L) 09/16/2022 0455   HCT 51.0 08/11/2021 0937   PLT 184 09/16/2022 0455   PLT 261 08/11/2021 0937   MCV 88.1 09/16/2022 0455   MCV 93 08/11/2021 0937   MCV 93 01/29/2013 0040   MCH 31.7 09/16/2022 0455   MCHC 35.9 09/16/2022 0455   RDW 13.4 09/16/2022 0455   RDW 12.4 08/11/2021 0937   RDW 13.8 01/29/2013 0040   LYMPHSABS 2.9 07/20/2022 1627   LYMPHSABS 4.8 (H) 08/11/2021 0937   MONOABS 0.5  07/20/2022 1627   EOSABS 0.2 07/20/2022 1627   EOSABS 0.5 (H) 08/11/2021 0937   BASOSABS 0.1 07/20/2022 1627   BASOSABS 0.1 08/11/2021 0937    BMET    Component Value Date/Time   NA 131 (L) 09/15/2022 0626   NA 140 08/11/2021 0937   NA 139 01/29/2013 0040   K 4.0 09/15/2022 0626   K 4.0 01/29/2013 0040   CL 98 09/15/2022 0626   CL 107 01/29/2013 0040   CO2 22 09/15/2022 0626   CO2 28 01/29/2013 0040   GLUCOSE 175 (H) 09/15/2022 0626   GLUCOSE 228 (H) 01/29/2013 0040   BUN 7 09/15/2022 0626   BUN 11 08/11/2021 0937   BUN 15 01/29/2013 0040   CREATININE 0.78 09/15/2022 0626   CREATININE 0.82 08/11/2022 1455   CALCIUM 8.4 (L) 09/15/2022 0626   CALCIUM 9.2 01/29/2013 0040   GFRNONAA >60 09/15/2022 0626   GFRNONAA >60 01/29/2013 0040   GFRAA 104 11/13/2019 1550   GFRAA >60 01/29/2013 0040    INR    Component Value Date/Time   INR 1.2 06/28/2022 1116     Intake/Output Summary (Last 24 hours) at 09/16/2022 0957 Last data filed at 09/16/2022 0800 Gross per 24 hour  Intake 2014.51 ml  Output 850 ml  Net 1164.51 ml     Assessment/Plan:  42 y.o. male is s/p lower extremity angiogram with lysis catheter.  1 Day Post-Op   PLAN: Martin Mullen patient's power of attorney plans on coming to speak with the patient on Sunday to discuss with the patient the need for a right above-the-knee amputation due to severe ischemia.  Martin Mullen will call me on Sunday after meeting with Martin Mullen to let Martin Mullen know whether she consents for the surgical procedure.  If she does give Martin Mullen consent I will place the patient on the schedule early next week.  DVT prophylaxis: Heparin infusion  I discussed in detail the plan with Dr. Levora Dredge MD and he agrees with the plan   Marcie Bal [facility] 09/16/2022 9:57 AM

## 2022-09-16 NOTE — Progress Notes (Addendum)
PROGRESS NOTE   HPI was taken from Dr. Huel Cote: Martin Mullen is a 42 y.o. 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   [name] states that he noticed that his right foot was becoming more painful over the last 2 days, especially with standing.  He denies noticing any swelling in his legs though.  He has noticed some drainage, but states it did not look purulent.  He denies any fever, chills, nausea, vomiting, diarrhea, shortness of breath, chest pain, palpitations.  He notes that he has not been taking his Eliquis for approximately 3 months now.  When asked him why he discontinued, he stated he no longer wanted to take it but did not have any particular reason for that.   Per chart review, patient was seen at the wound care center today.  At the time, it was noted that his left second toe amputation site looks significantly worse with cyanosis.  This was noted to be a change compared to 2 weeks prior.  Due to this, he was instructed to go to the ED.   ED course: On arrival to the ED, patient was normotensive at 126/86 with heart rate of 115.  He was saturating at 98% on room air.  He was afebrile at 98.7.  Initial workup notable for WBC of 15.5, hemoglobin of 17.1, platelets of 238, sodium of 134, glucose of 288, creatinine 1.01 with GFR above 60.  Vascular surgery was consulted and patient started on IV heparin.  [facility] contacted for admission.  7/30: S/p angio and stenting on the right foot.  Status post thrombectomy.  Had a abrupt change in his pulse exam last night suggestive of foot ischemia.  Likely failure of [place] per vascular 7/31: Plan for repeat angio and thrombolysis with continuous  tPA infusion today 8/1: Angio and stenting of the right LE, mechanical thrombectomy 8/2: Likely AKA early next week once family decides over the weekend  Martin Mullen  ZOX:[id number] DOB: 04-16-1980 DOA: 09/12/2022 PCP: [name], NP    Assessment & Plan:   Principal Problem:   Acute lower limb ischemia Active Problems:   Diabetic foot ulcer associated with type 2 diabetes mellitus (HCC)   Recurrent strokes (HCC)   Uncontrolled type 2 diabetes mellitus with hyperglycemia, with long-term current use of insulin (HCC)   HTN (hypertension)   CAD S/P percutaneous coronary angioplasty   Arterial occlusion  Assessment and Plan: Acute right lower limb ischemia: arterial duplex obtained that demonstrates severe atheromatous plaque of the distal right SFA in addition to occlusion of the right popliteal artery stent, although posterior tibial artery remains patent. Likely secondary to noncompliance w/ eliquis. Holding eliquis. Continue on IV heparin. Oxycodone, morphine prn for pain. Continue w/ neurovascular checks  -S/p angio with stenting of the right superficial femoral and popliteal artery.  Status post thrombectomy on 7/30.  Overnight he had absent pulses from the knee down on Doppler.  Foot was becoming cyanotic and cold -Repeat angio of right lower extremity with mechanical thrombectomy and stenting on 8/1 - POA is coming to see him on Sunday to speak with him concerning AKA as he has professed in the past he does not want. Plan is to keep him comfortable until then and  if POA gives consent we will schedule AKA next early next week.    Diabetic foot ulcer:  involving the second right toe that has been since amputated. Remains an ulcer at his amputation site,   Sepsis: Fever, tachycardia, hypertension. may have some infection as now he is spiking low-grade fever with a Tmax of 100.9. Likely secondary to poor wound healing.  Will start IV vancomycin, Flagyl in cefepime for now.  Could  be early sepsis.  Will send blood and wound culture.  ID consult.  Will request femoral triple-lumen to be removed and sent tip for culture. 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 Has been on chronic Eliquis, but self discontinued 3 months ago. Holding eliquis. Continue on IV heparin.  Seen by oncology.  Recommends discharge on Eliquis and antiplatelet agents   DM2: poorly controlled, HbA1c 7.7. Continue on SSI w/ accuchecks   Hx of CAD: s/p percutaneous coronary angioplasty. Has not been taking eliquis at home. Holding eliquis. Continue on IV heparin for now   HTN: As needed hydralazine and labetalol  Goals of care conversation  Palliative care seen.  Full code for now -he remains at very high risk for vascular complications including ischemia and death   DVT prophylaxis: IV heparin  Code Status: full  Family Communication:  Disposition Plan: likely d/c back home   Level of care: Progressive Status is: Inpatient Remains inpatient appropriate because: severity of illness, requiring IV heparin     Consultants:  Vasc surg  ID Palliative care Oncology  Procedures: Angiogram and stenting on 7/30.  Repeat angiogram on 8/1   Subjective:  Low-grade fever with a Tmax of 100.9.  He is not to keen on getting amputation  Objective: Vitals:   09/16/22 1100 09/16/22 1156 09/16/22 1200 09/16/22 1300  BP: 139/77  118/76 125/84  Pulse: 61  (!) 116 (!) 114  Resp: 12  (!) 7 12  Temp:  (!) 100.9 F (38.3 C)    TempSrc:  Oral    SpO2: (!) 89%  95% 92%  Weight:      Height:        Intake/Output Summary (Last 24 hours) at 09/16/2022 1530 Last data filed at 09/16/2022 1343 Gross per 24 hour  Intake 1962.55 ml  Output 975 ml  Net 987.55 ml   Filed Weights   09/12/22 1613 09/12/22 1616 09/14/22 1324  Weight: 86.2 kg 86.2 kg 86.2 kg    Examination:  General exam: Appears calm and comfortable   Respiratory system: clear breath sounds b/l Cardiovascular system: S1 & S2 +. No rubs, gallops or clicks. Gastrointestinal system: Abdomen is nondistended, soft and nontender. Hypoactive bowel sounds heard. Central nervous system: Awake and alert, nonfocal Extremities: Absent pulses with ischemic changes of the right lower extremity below knee Psychiatry: Normal mood and affect    Data Reviewed: I have personally reviewed following labs and imaging studies  CBC: Recent Labs  Lab 09/14/22 1341 09/14/22 1947 09/15/22 0034 09/15/22 0626 09/16/22 0455  WBC 10.1 14.2* 16.9* 15.9* 12.7*  HGB 15.0 15.0 14.1 14.2 13.3  HCT 43.2 43.8 40.3 41.0 37.0*  MCV 90.0 90.1 89.2 88.2 88.1  PLT 193 179 163 168 184   Basic Metabolic Panel: Recent Labs  Lab 09/12/22 1617 09/13/22 0647 09/14/22 0511 09/15/22 0626  NA 134* 136 133* 131*  K 3.9 3.4* 3.8 4.0  CL 97* 102 103 98  CO2 27 24 24  22  GLUCOSE 288* 106* 147* 175*  BUN 10 7 9 7   CREATININE 1.01 0.75 0.81 0.78  CALCIUM 9.0 8.6* 8.2* 8.4*   GFR: Estimated Creatinine Clearance: 128.1 mL/min (by C-G formula based on SCr of 0.78 mg/dL). Liver Function Tests: Recent Labs  Lab 09/12/22 1617  AST 11*  ALT 10  ALKPHOS 128*  BILITOT 0.4  PROT 7.8  ALBUMIN 4.0    CBG: Recent Labs  Lab 09/15/22 1300 09/15/22 1701 09/15/22 2122 09/16/22 0719 09/16/22 1130  GLUCAP 172* 239* 126* 155* 95     Recent Results (from the past 240 hour(s))  MRSA Next Gen by PCR, Nasal     Status: None   Collection Time: 09/13/22 12:14 PM   Specimen: Nasal Mucosa; Nasal Swab  Result Value Ref Range Status   MRSA by PCR Next Gen NOT DETECTED NOT DETECTED Final    Comment: (NOTE) The GeneXpert MRSA Assay (FDA approved for NASAL specimens only), is one component of a comprehensive MRSA colonization surveillance program. It is not intended to diagnose MRSA infection nor to guide or monitor treatment for MRSA infections. Test performance is not FDA  approved in patients less than 16 years old. Performed at [facility], [address] [place], [place] [address]          Radiology Studies: PERIPHERAL VASCULAR CATHETERIZATION  Result Date: 09/15/2022 See surgical note for result.       Scheduled Meds:  Chlorhexidine Gluconate Cloth  6 each Topical Daily   insulin aspart  0-15 Units Subcutaneous TID WC   sodium chloride flush  3 mL Intravenous Q12H   Continuous Infusions:  sodium chloride     sodium chloride 75 mL/hr at 09/16/22 1300   heparin     vancomycin 1,000 mg (09/16/22 1459)     LOS: 4 days   Time spent 25 minutes    Delfino Lovett, MD [facility] Pager [phone]  If 7PM-7AM, please contact night-coverage [email or web address] 09/16/2022, 3:30 PM

## 2022-09-16 NOTE — Consult Note (Signed)
Consultation Note Date: 09/16/2022   Patient Name: Martin Mullen  DOB: May 28, 1980  MRN: [id number]  Age / Sex: 42 y.o., male  PCP: [name], NP Referring Physician: Delfino Lovett, MD  Reason for Consultation: Establishing goals of care   HPI/Brief Hospital Course: 42 y.o. 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  7/30 s/p angiogram and stent placement, s/p thrombectomy Overnight had abrupt change in circulation status-lost pulses, suggestive of ischemia 7/31 returned to vascular lab for repeat angiogram and thrombolysis with continuous tPA infusion 8/1 returned to vascular lab, angiogram with stenting, mechanical thrombectomy  Likely to need AKA early next week per vascular team   Palliative medicine was consulted for assisting with goals of care conversations.  Subjective:  Extensive chart review has been completed prior to meeting patient including labs, vital signs, imaging, progress notes, orders, and available advanced directive documents from current and previous encounters.  Visited with Mr. Hovis at his bedside. Awake, alert, able to engage in goals of care conversations, answers questions appropriately. Delayed but appropriate responses. Reports feeling drowsy due to recent pain medication administration but able to engage and remain awake for conversations. No family present at bedside during visit.  Introduced myself as a Publishing rights manager as a member of the palliative care team. Explained palliative medicine is specialized medical care for people living with  serious illness. It focuses on providing relief from the symptoms and stress of a serious illness. The goal is to improve quality of life for both the patient and the family.   Mr. Hanzlik provides a brief life review. He has an ex girlfriend-[name] that he has 39.48 year old twin girls with. Wyatt Mage has primary custody but Mr. Tendler is allowed to visit with his children. He continues to have a close relationship with [name]. He currently lives with a friend-Jimmy, they do not have the best relationship, [name] does not provider Mr. Waymire with any assistance or support. Prior to admission he shares he was independent with ADL's, does not drive. He spends most of his days indoors watching TV.  Mr. Strange shares his understanding of current medical situation. Aware he has lost blood supply to his right lower extremity, has underwent several interventions to restore blood flow but likely unsuccessfully and likely to require AKA. Attempted to elicit thoughts surrounding AKA, Mr. Witucki shares he is not "thrilled" about the situation but is open to any and all interventions that will keep him alive.  Attempted to discuss discontinuation of Eliquis. He shares he simply did not like taking a medication daily and feeling dependent upon the medicine. He shares discontinuation is not related to finances. We dicussed the need for anticoagulation related to clotting disorder for which he is aware, we also discussed risk of recurrent CVA, MI and recurrent clotting without Eliquis again for which he is aware. He states he will be open to continuing Eliquis at discharge and will discuss with a medical provider prior to discontinuing in the future.  We discussed code status-Full Code versus Do Not Resuscitate. He clearly states that he wishes to any and all interventions to attempt resuscitation-will remain Full Code.  I discussed importance of continued conversations with family/support persons and all members of their  medical team regarding overall plan of care and treatment options ensuring decisions are in alignment with patients goals of care.  All questions/concerns addressed. Emotional support provided to patient/family/support persons. PMT will continue to follow and support patient as needed.  Chat with primary team, [name] planning to visit Mr. Buth on Sunday-she has shared that in the past Mr. Dirr has stated he would not want amputation if needed. Will attempt to meet with [name] and Mr. Chavarria to further discuss goals of care at that time.  Objective: Primary Diagnoses: Present on Admission:  Acute lower limb ischemia  Diabetic foot ulcer associated with type 2 diabetes mellitus (HCC)  Recurrent strokes (HCC)  HTN (hypertension)   Physical Exam Constitutional:      General: He is not in acute distress.    Appearance: He is ill-appearing.  Cardiovascular:     Rate and Rhythm: Tachycardia present.  Pulmonary:     Effort: Pulmonary effort is normal. No respiratory distress.  Neurological:     Mental Status: He is alert.     Comments: Delayed but appropriate responses during conversations     Vital Signs: BP 110/78 (BP Location: Right Arm)   Pulse (!) 109   Temp 98.9 F (37.2 C)   Resp 12   Ht 5\' 11"  (1.803 m)   Wt 86.2 kg   SpO2 92%   BMI 26.50 kg/m  Pain Scale: 0-10 POSS *See Group Information*: S-Acceptable,Sleep, easy to arouse Pain Score: Asleep  IO: Intake/output summary:  Intake/Output Summary (Last 24 hours) at 09/16/2022 1659 Last data filed at 09/16/2022 1658 Gross per 24 hour  Intake 2974.86 ml  Output 1090 ml  Net 1884.86 ml    LBM: Last BM Date :  (PTA) Baseline Weight: Weight: 86.2 kg Most recent weight: Weight: 86.2 kg      Assessment and Plan  SUMMARY OF RECOMMENDATIONS   Full Code-Full Scope PMT to continue to follow for ongoing needs and support  Discussed With: Primary team and nursing staff   Thank you for this consult and allowing  Palliative Medicine to participate in the care of [name]. Palliative medicine will continue to follow and assist as needed.   Time Total: 75 minutes  Time spent includes: Detailed review of medical records (labs, imaging, vital signs), medically appropriate exam (mental status, respiratory, cardiac, skin), discussed with treatment team, counseling and educating patient, family and staff, documenting clinical information, medication management and coordination of care.   Signed by: Leeanne Deed, DNP, AGNP-C Palliative Medicine    Please contact Palliative Medicine Team phone at [phone] for questions and concerns.  For individual provider: See Loretha Stapler

## 2022-09-17 DIAGNOSIS — Z515 Encounter for palliative care: Secondary | ICD-10-CM

## 2022-09-17 DIAGNOSIS — E11621 Type 2 diabetes mellitus with foot ulcer: Secondary | ICD-10-CM | POA: Diagnosis not present

## 2022-09-17 DIAGNOSIS — I2510 Atherosclerotic heart disease of native coronary artery without angina pectoris: Secondary | ICD-10-CM | POA: Diagnosis not present

## 2022-09-17 DIAGNOSIS — I998 Other disorder of circulatory system: Secondary | ICD-10-CM | POA: Diagnosis not present

## 2022-09-17 DIAGNOSIS — E1165 Type 2 diabetes mellitus with hyperglycemia: Secondary | ICD-10-CM | POA: Diagnosis not present

## 2022-09-17 DIAGNOSIS — I639 Cerebral infarction, unspecified: Secondary | ICD-10-CM | POA: Diagnosis not present

## 2022-09-17 LAB — GLUCOSE, CAPILLARY
Glucose-Capillary: 141 mg/dL — ABNORMAL HIGH (ref 70–99)
Glucose-Capillary: 229 mg/dL — ABNORMAL HIGH (ref 70–99)
Glucose-Capillary: 138 mg/dL — ABNORMAL HIGH (ref 70–99)
Glucose-Capillary: 146 mg/dL — ABNORMAL HIGH (ref 70–99)
Glucose-Capillary: 241 mg/dL — ABNORMAL HIGH (ref 70–99)

## 2022-09-17 LAB — HEPARIN LEVEL (UNFRACTIONATED)
Heparin Unfractionated: 0.24 IU/mL — ABNORMAL LOW (ref 0.30–0.70)
Heparin Unfractionated: 0.18 IU/mL — ABNORMAL LOW (ref 0.30–0.70)
Heparin Unfractionated: 0.36 IU/mL (ref 0.30–0.70)

## 2022-09-17 MED ORDER — HEPARIN BOLUS VIA INFUSION
2500.0000 [IU] | Freq: Once | INTRAVENOUS | Status: AC
  Administered 2022-09-17: 2500 [IU] via INTRAVENOUS
  Filled 2022-09-17: qty 2500

## 2022-09-17 MED ORDER — NICOTINE 21 MG/24HR TD PT24
21.0000 mg | MEDICATED_PATCH | Freq: Every day | TRANSDERMAL | Status: DC
  Administered 2022-09-17 – 2022-09-21 (×5): 21 mg via TRANSDERMAL
  Filled 2022-09-17 (×5): qty 1

## 2022-09-17 MED ORDER — OXYCODONE HCL 5 MG PO TABS
5.0000 mg | ORAL_TABLET | ORAL | Status: DC | PRN
  Administered 2022-09-17 – 2022-09-26 (×18): 10 mg via ORAL
  Administered 2022-09-27: 5 mg via ORAL
  Administered 2022-09-27 – 2022-09-28 (×2): 10 mg via ORAL
  Administered 2022-09-28: 5 mg via ORAL
  Filled 2022-09-17 (×2): qty 2
  Filled 2022-09-17: qty 1
  Filled 2022-09-17 (×3): qty 2
  Filled 2022-09-17: qty 1
  Filled 2022-09-17 (×14): qty 2
  Filled 2022-09-17: qty 1
  Filled 2022-09-17: qty 2

## 2022-09-17 MED ORDER — HEPARIN BOLUS VIA INFUSION
2600.0000 [IU] | Freq: Once | INTRAVENOUS | Status: AC
  Administered 2022-09-17: 2600 [IU] via INTRAVENOUS
  Filled 2022-09-17: qty 2600

## 2022-09-17 MED ORDER — HEPARIN BOLUS VIA INFUSION
1300.0000 [IU] | Freq: Once | INTRAVENOUS | Status: AC
  Administered 2022-09-17: 1300 [IU] via INTRAVENOUS
  Filled 2022-09-17: qty 1300

## 2022-09-17 NOTE — Progress Notes (Signed)
[facility]  Daily Progress Note   Subjective  -   Complains of moderate right foot pain and requests his Nicotine Patch.  No fever or chills reported.  Objective Vitals:   09/17/22 0930 09/17/22 1000 09/17/22 1030 09/17/22 1100  BP: (!) 161/95 (!) 151/100 (!) 167/95 (!) 154/107  Pulse:   (!) 105 (!) 102  Resp: 15 13 16 15   Temp:      TempSrc:      SpO2:   98% 98%  Weight:      Height:        Intake/Output Summary (Last 24 hours) at 09/17/2022 1137 Last data filed at 09/17/2022 1118 Gross per 24 hour  Intake 7822.87 ml  Output 3225 ml  Net 4597.87 ml    PULM  CTAB CV  RRR VASC  Palpable femoral pulses.  Left leg and foot are warm.  Right leg is warm but foot is mottled and cool.  Dry gangrene of prior right 2nd toe amp site.  No pus seen.  No cellulitis.  No odor.  Laboratory CBC    Component Value Date/Time   WBC 8.8 09/17/2022 0355   HGB 11.2 (L) 09/17/2022 0355   HGB 17.9 (H) 08/11/2021 0937   HCT 32.3 (L) 09/17/2022 0355   HCT 51.0 08/11/2021 0937   PLT 143 (L) 09/17/2022 0355   PLT 261 08/11/2021 0937    BMET    Component Value Date/Time   NA 129 (L) 09/16/2022 1618   NA 140 08/11/2021 0937   NA 139 01/29/2013 0040   K 3.8 09/16/2022 1618   K 4.0 01/29/2013 0040   CL 98 09/16/2022 1618   CL 107 01/29/2013 0040   CO2 22 09/16/2022 1618   CO2 28 01/29/2013 0040   GLUCOSE 152 (H) 09/16/2022 1618   GLUCOSE 228 (H) 01/29/2013 0040   BUN 9 09/16/2022 1618   BUN 11 08/11/2021 0937   BUN 15 01/29/2013 0040   CREATININE 0.75 09/17/2022 0355   CREATININE 0.82 08/11/2022 1455   CALCIUM 7.8 (L) 09/16/2022 1618   CALCIUM 9.2 01/29/2013 0040   GFRNONAA >60 09/17/2022 0355   GFRNONAA >60 01/29/2013 0040   GFRAA 104 11/13/2019 1550   GFRAA >60 01/29/2013 0040    Assessment/Planning: POD #2 s/p failed lysis/revascularization of right foot  Plan is for major amputation next week.  POA to arrive tomorrow to discuss with patient in  person and provide consent as appropriate.  No new vascular issues.  No sign of progressive infection.   Iline Oven  09/17/2022, 11:37 AM

## 2022-09-17 NOTE — Progress Notes (Signed)
ANTICOAGULATION CONSULT NOTE - Initial Consult  Pharmacy Consult for heparin Indication:  arterial occlusion of RLE, hx PAD  No Known Allergies  Patient Measurements: Height: 5\' 11"  (180.3 cm) Weight: 86.2 kg (190 lb 0.6 oz) IBW/kg (Calculated) : 75.3 Heparin Dosing Weight: 86.2 kg  Vital Signs: Temp: 100.2 F (37.9 C) (08/03 0000) Temp Source: Oral (08/03 0000) BP: 137/85 (08/03 0000) Pulse Rate: 104 (08/03 0000)  Labs: Recent Labs    09/14/22 0511 09/14/22 1341 09/15/22 0626 09/16/22 0455 09/16/22 1618 09/16/22 2245  HGB 14.3   < > 14.2 13.3 12.1*  --   HCT 40.8   < > 41.0 37.0* 34.4*  --   PLT 220   < > 168 184 162  --   APTT  --   --   --   --  31  --   LABPROT  --   --   --   --  14.5  --   INR  --   --   --   --  1.1  --   HEPARINUNFRC  --   --   --   --   --  <0.10*  CREATININE 0.81  --  0.78  --  0.89  --    < > = values in this interval not displayed.    Estimated Creatinine Clearance: 115.2 mL/min (by C-G formula based on SCr of 0.89 mg/dL).   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   Chicken pox    Coronary artery disease    Inferior ST elevation myocardial infarction in July of 2014. Cardiac catheterization showed 95% distal RCA stenosis and 90% first diagonal stenosis with normal ejection fraction. He underwent PCI in bare-metal stent placement to the distal RCA.   Diabetes mellitus without complication (HCC)    Hypercholesterolemia    Stroke (HCC)    Tobacco use    Assessment: 42 y/o male presenting from outpatient wound care center due to pain and cyanotic vasculature in his right leg. Pharmacy has been consulted to dose heparin. 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   Goal of Therapy:  Heparin level 0.3-0.7 units/ml Monitor platelets by  anticoagulation protocol: Yes   Plan:  8/2:  HL @ 2245 = < 0.1, SUBtherapeutic  - RN reports there have not been any interrruptions in heparin gtt  - Will order heparin 2600 units IV X 1 bolus and increase drip rate to 1850 units/hr  - Will recheck HL 6 hrs after rate change Check daily CBC and signs/symptoms of bleeding.  , D, PharmD 09/17/2022 12:49 AM

## 2022-09-17 NOTE — Progress Notes (Signed)
ANTICOAGULATION CONSULT NOTE  Pharmacy Consult for heparin Indication:  arterial occlusion of RLE, hx PAD  No Known Allergies  Patient Measurements: Height: 5\' 11"  (180.3 cm) Weight: 86.2 kg (190 lb 0.6 oz) IBW/kg (Calculated) : 75.3 Heparin Dosing Weight: 86.2 kg  Vital Signs: Temp: 98.6 F (37 C) (08/03 0722) Temp Source: Oral (08/03 0722) BP: 151/89 (08/03 0730) Pulse Rate: 102 (08/03 0730)  Labs: Recent Labs    09/15/22 0626 09/16/22 0455 09/16/22 1618 09/16/22 2245 09/17/22 0355 09/17/22 0725  HGB 14.2 13.3 12.1*  --  11.2*  --   HCT 41.0 37.0* 34.4*  --  32.3*  --   PLT 168 184 162  --  143*  --   APTT  --   --  31  --   --   --   LABPROT  --   --  14.5  --   --   --   INR  --   --  1.1  --   --   --   HEPARINUNFRC  --   --   --  <0.10*  --  0.24*  CREATININE 0.78  --  0.89  --  0.75  --     Estimated Creatinine Clearance: 128.1 mL/min (by C-G formula based on SCr of 0.75 mg/dL).   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   Chicken pox    Coronary artery disease    Inferior ST elevation myocardial infarction in July of 2014. Cardiac catheterization showed 95% distal RCA stenosis and 90% first diagonal stenosis with normal ejection fraction. He underwent PCI in bare-metal stent placement to the distal RCA.   Diabetes mellitus without complication (HCC)    Hypercholesterolemia    Stroke (HCC)    Tobacco use    Assessment: 42 y/o male presenting from outpatient wound care center due to pain and cyanotic vasculature in his right leg. Pharmacy has been consulted to dose heparin. 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   *Patietn will need amputation per MD assessment*  Goal of Therapy:  Heparin level 0.3-0.7 units/ml Monitor platelets by anticoagulation  protocol: Yes  8/2 @ 2245 HL < 0.1, Subtherapeutic (heparin rate 1550 un/hr) 8/3 @ 0725 HL 0.24 Subtherapeutic (heparin rate 1850 un/hr)  Plan:  HL remains subtherapeutic, Hgb and PLT showing slight decrease but stable and no s/sx of bleeding noted per chart review. Will order heparin 1300 units IV X 1 bolus and increase drip rate to 2000 units/hr  Will recheck HL 6 hrs after rate change Check daily CBC and signs/symptoms of bleeding.   Rodriguez-Guzman PharmD, BCPS 09/17/2022 8:04 AM

## 2022-09-17 NOTE — Progress Notes (Signed)
Daily Progress Note   Patient Name: Martin Mullen       Date: 09/17/2022 DOB: 1980/06/24  Age: 42 y.o. MRN#: [id number] Attending Physician: Delfino Lovett, MD Primary Care Physician: [name], NP Admit Date: 09/12/2022  Reason for Consultation/Follow-up: Establishing goals of care  HPI/Brief Hospital Review: 42 y.o. 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   7/30 s/p angiogram and stent placement, s/p thrombectomy Overnight had abrupt change in circulation status-lost pulses, suggestive of ischemia 7/31 returned to vascular lab for repeat angiogram and thrombolysis with continuous tPA infusion 8/1 returned to vascular lab, angiogram with stenting, mechanical thrombectomy   Likely to need AKA early next week per vascular team    Palliative medicine was consulted for assisting with goals of care conversations.  Subjective: Extensive chart review has been completed prior to meeting patient including labs, vital signs, imaging, progress notes, orders, and available advanced directive documents from current and previous encounters.    Visited with Martin Mullen at his bedside this AM, reviewed conversations had yesterday for which he can recall. Reports having a good night, pain better controlled today. We discussed again likely need for amputation-this AM he shares he is not willing to proceed with amputation. We  reviewed risk of worsening infection and possible death without proceeding with amputation. He did not have a response to this but did share "I am not ready to die."  Again visited bedside with [name]-HCPOA present and Kaye-aunt. [name] able to share her understanding of current medical condition and need for amputation.  [name] addressed Martin Mullen regarding amputation surgery, she encourages Martin Mullen to proceed with surgery to allow him to continue to watch their children grow up. During their conversations, Martin Mullen agrees to proceeding with conversation. [name] further emphasizes the importance for him to be an active and willing participant in therapy and likely need for SNF.  Wyatt Mage shares her ongoing concern for Martin Mullen living conditions, concern for poor hygiene  habits and overall personal care. She shares Martin Mullen struggles with being able to afford his medications-most specifically Eliquis each month which contributes to him not taking medication as prescribed. Will reach out to [facility] for assistance.  Shared above conversations with primary team, concern for back and forth decision making.  Answered and addressed all questions and concerns. PMT to continue to follow for ongoing needs and support.  Care plan was discussed with primary team and nursing staff.  Thank you for allowing the Palliative Medicine Team to assist in the care of this patient.  Total time:  50 minutes  Time spent includes: Detailed review of medical records (labs, imaging, vital signs), medically appropriate exam (mental status, respiratory, cardiac, skin), discussed with treatment team, counseling and educating patient, family and staff, documenting clinical information, medication management and coordination of care.  Leeanne Deed, DNP, AGNP-C Palliative Medicine   Please contact Palliative Medicine Team phone at [phone] for questions and concerns.

## 2022-09-17 NOTE — Progress Notes (Signed)
ANTICOAGULATION CONSULT NOTE  Pharmacy Consult for heparin Indication:  arterial occlusion of RLE, hx PAD  No Known Allergies  Patient Measurements: Height: 5\' 11"  (180.3 cm) Weight: 86.2 kg (190 lb 0.6 oz) IBW/kg (Calculated) : 75.3 Heparin Dosing Weight: 86.2 kg  Vital Signs: Temp: 98.6 F (37 C) (08/03 0722) Temp Source: Oral (08/03 0722) BP: 176/96 (08/03 1500) Pulse Rate: 115 (08/03 1500)  Labs: Recent Labs    09/15/22 0626 09/16/22 0455 09/16/22 1618 09/16/22 2245 09/17/22 0355 09/17/22 0725 09/17/22 1500  HGB 14.2 13.3 12.1*  --  11.2*  --   --   HCT 41.0 37.0* 34.4*  --  32.3*  --   --   PLT 168 184 162  --  143*  --   --   APTT  --   --  31  --   --   --   --   LABPROT  --   --  14.5  --   --   --   --   INR  --   --  1.1  --   --   --   --   HEPARINUNFRC  --   --   --  <0.10*  --  0.24* 0.18*  CREATININE 0.78  --  0.89  --  0.75  --   --     Estimated Creatinine Clearance: 128.1 mL/min (by C-G formula based on SCr of 0.75 mg/dL).   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   Chicken pox    Coronary artery disease    Inferior ST elevation myocardial infarction in July of 2014. Cardiac catheterization showed 95% distal RCA stenosis and 90% first diagonal stenosis with normal ejection fraction. He underwent PCI in bare-metal stent placement to the distal RCA.   Diabetes mellitus without complication (HCC)    Hypercholesterolemia    Stroke (HCC)    Tobacco use    Assessment: 42 y/o male presenting from outpatient wound care center due to pain and cyanotic vasculature in his right leg. Pharmacy has been consulted to dose heparin. 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   *Patietn will need amputation per MD assessment*  Goal of Therapy:  Heparin level  0.3-0.7 units/ml Monitor platelets by anticoagulation protocol: Yes  8/2 @ 2245 HL < 0.1, Subtherapeutic (heparin rate 1550 un/hr) 8/3 @ 0725 HL 0.24 Subtherapeutic (heparin rate 1850 un/hr) 8/3 1500 HL 0.18 Subtherapeutic  2000>>2200u/hr  Plan:  HL remains subtherapeutic, Hgb and PLT showing slight decrease but stable and no s/sx of bleeding noted per chart review. No drip issues upon review with RN. Will order heparin 2500 units IV X 1 bolus and increase drip rate to 2200 units/hr  Will recheck HL 6 hrs after rate change Check daily CBC and signs/symptoms of bleeding.   Bari Mantis PharmD Clinical Pharmacist 09/17/2022

## 2022-09-17 NOTE — Progress Notes (Signed)
ANTICOAGULATION CONSULT NOTE  Pharmacy Consult for heparin Indication:  arterial occlusion of RLE, hx PAD  No Known Allergies  Patient Measurements: Height: 5\' 11"  (180.3 cm) Weight: 86.2 kg (190 lb 0.6 oz) IBW/kg (Calculated) : 75.3 Heparin Dosing Weight: 86.2 kg  Vital Signs: Temp: 99 F (37.2 C) (08/03 2030) Temp Source: Oral (08/03 2030) BP: 152/87 (08/03 2200) Pulse Rate: 111 (08/03 2200)  Labs: Recent Labs    09/15/22 0626 09/16/22 0455 09/16/22 1618 09/16/22 2245 09/17/22 0355 09/17/22 0725 09/17/22 1500 09/17/22 2203  HGB 14.2 13.3 12.1*  --  11.2*  --   --   --   HCT 41.0 37.0* 34.4*  --  32.3*  --   --   --   PLT 168 184 162  --  143*  --   --   --   APTT  --   --  31  --   --   --   --   --   LABPROT  --   --  14.5  --   --   --   --   --   INR  --   --  1.1  --   --   --   --   --   HEPARINUNFRC  --   --   --    < >  --  0.24* 0.18* 0.36  CREATININE 0.78  --  0.89  --  0.75  --   --   --    < > = values in this interval not displayed.    Estimated Creatinine Clearance: 128.1 mL/min (by C-G formula based on SCr of 0.75 mg/dL).   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   Chicken pox    Coronary artery disease    Inferior ST elevation myocardial infarction in July of 2014. Cardiac catheterization showed 95% distal RCA stenosis and 90% first diagonal stenosis with normal ejection fraction. He underwent PCI in bare-metal stent placement to the distal RCA.   Diabetes mellitus without complication (HCC)    Hypercholesterolemia    Stroke (HCC)    Tobacco use    Assessment: 42 y/o male presenting from outpatient wound care center due to pain and cyanotic vasculature in his right leg. Pharmacy has been consulted to dose heparin. Patient has a history of heterozygous prothrombin gene mutation on Eliquis PTA, however patient reports last dose was ~2  months ago.   *Patietn will need amputation per MD assessment*  Goal of Therapy:  Heparin level 0.3-0.7 units/ml Monitor platelets by anticoagulation protocol: Yes  8/2 @ 2245 HL < 0.1, Subtherapeutic (heparin rate 1550 un/hr) 8/3 @ 0725 HL 0.24 Subtherapeutic (heparin rate 1850 un/hr) 8/3 1500 HL 0.18 Subtherapeutic  2000>>2200u/hr 8/3 2203 HL 0.36        Therapeutic X 1   Plan:  8/3:  HL @ 2203 = 0.36, therapeutic X 1  - Will continue this pt on current rate and recheck HL in 6 hrs on 8/4 @ 0400 Check daily CBC and signs/symptoms of bleeding.  , D Clinical Pharmacist 09/17/2022

## 2022-09-17 NOTE — Progress Notes (Signed)
PROGRESS NOTE   HPI was taken from Dr. Huel Cote: Martin Mullen is a 42 y.o. 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   Martin Mullen states that he noticed that his right foot was becoming more painful over the last 2 days, especially with standing.  He denies noticing any swelling in his legs though.  He has noticed some drainage, but states it did not look purulent.  He denies any fever, chills, nausea, vomiting, diarrhea, shortness of breath, chest pain, palpitations.  He notes that he has not been taking his Eliquis for approximately 3 months now.  When asked him why he discontinued, he stated he no longer wanted to take it but did not have any particular reason for that.   Per chart review, patient was seen at the wound care center today.  At the time, it was noted that his left second toe amputation site looks significantly worse with cyanosis.  This was noted to be a change compared to 2 weeks prior.  Due to this, he was instructed to go to the ED.   ED course: On arrival to the ED, patient was normotensive at 126/86 with heart rate of 115.  He was saturating at 98% on room air.  He was afebrile at 98.7.  Initial workup notable for WBC of 15.5, hemoglobin of 17.1, platelets of 238, sodium of 134, glucose of 288, creatinine 1.01 with GFR above 60.  Vascular surgery was consulted and patient started on IV heparin.  [facility] contacted for admission.  7/30: S/p angio and stenting on the right foot.  Status post thrombectomy.  Had a abrupt change in his pulse exam last night suggestive of foot ischemia.  Likely failure of [place]gr[place] per vascular 7/31: Plan for repeat angio and thrombolysis with continuous  tPA infusion today 8/1: Angio and stenting of the right LE, mechanical thrombectomy 8/2: Likely AKA early next week once family decides over the weekend 8/3: Being treated for suspected sepsis with IV antibiotics.  Increased oxycodone dose for better pain control  Martin Mullen  ZOX:[id number] DOB: 09/20/1980 DOA: 09/12/2022 PCP: [name], NP    Assessment & Plan:   Principal Problem:   Acute lower limb ischemia Active Problems:   Diabetic foot ulcer associated with type 2 diabetes mellitus (HCC)   Recurrent strokes (HCC)   Uncontrolled type 2 diabetes mellitus with hyperglycemia, with long-term current use of insulin (HCC)   HTN (hypertension)   CAD S/P percutaneous coronary angioplasty   Arterial occlusion  Assessment and Plan: Acute right lower limb ischemia: arterial duplex obtained that demonstrates severe atheromatous plaque of the distal right SFA in addition to occlusion of the right popliteal artery stent, although posterior tibial artery remains patent. Likely secondary to noncompliance w/ eliquis. Holding eliquis. Continue on IV heparin. Oxycodone, morphine prn for pain. Continue w/ neurovascular checks  -S/p angio with stenting of the right superficial femoral and popliteal artery.  Status post thrombectomy on 7/30.  Overnight he had absent pulses from the knee down on Doppler.  Foot was becoming cyanotic and cold -Repeat angio of right lower extremity with mechanical thrombectomy and stenting on 8/1 - POA is coming to see him on Sunday to speak with him concerning AKA as he has  professed in the past he does not want. Plan is to keep him comfortable until then and if POA gives consent we will schedule AKA next early next week.   Sepsis: Fever, tachycardia, hypotension. Diabetic foot ulcer:  involving the second right toe that has been since amputated. Remains an ulcer at his amputation site, wound culture growing gram-negative rods, gram-positive cocci may have some  infection as still has Tmax of 100.2.  Potentially wound infection, continue IV vancomycin, Flagyl, cefepime for now.  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 Has been on chronic Eliquis, but self discontinued 3 months ago. Holding eliquis. Continue on IV heparin.  Seen by oncology.  Recommends discharge on Eliquis and antiplatelet agents   DM2: poorly controlled, HbA1c 7.7. Continue on SSI w/ accuchecks   Hx of CAD: s/p percutaneous coronary angioplasty. Has not been taking eliquis at home. Holding eliquis. Continue on IV heparin for now   HTN: As needed hydralazine and labetalol  Goals of care conversation  Palliative care seen.  Full code for now -he remains at very high risk for vascular complications including ischemia and death   DVT prophylaxis: IV heparin  Code Status: full  Family Communication:  Disposition Plan: likely d/c back home   Level of care: Progressive Status is: Inpatient Remains inpatient appropriate because: severity of illness, requiring IV heparin     Consultants:  Vasc surg  ID Palliative care Oncology  Procedures: Angiogram and stenting on 7/30.  Repeat angiogram on 8/1   Subjective:  Low-grade fever with a Tmax of 100.2.  He is contemplating for amputation and hoping that family helps him to make decision  Objective: Vitals:   09/17/22 1000 09/17/22 1030 09/17/22 1100 09/17/22 1130  BP: (!) 151/100 (!) 167/95 (!) 154/107 126/80  Pulse:  (!) 105 (!) 102 94  Resp: 13 16 15 10   Temp:      TempSrc:      SpO2:  98% 98% 95%  Weight:      Height:        Intake/Output Summary (Last 24 hours) at 09/17/2022 1442 Last data filed at 09/17/2022 1150 Gross per 24 hour  Intake 7941.25 ml  Output 3125 ml  Net 4816.25 ml   Filed Weights   09/12/22 1613 09/12/22 1616 09/14/22 1324  Weight: 86.2 kg 86.2 kg 86.2 kg    Examination:  General exam: Appears calm  and comfortable  Respiratory system: clear breath sounds b/l Cardiovascular system: S1 & S2 +. No rubs, gallops or clicks. Gastrointestinal system: Abdomen is nondistended, soft and nontender. Hypoactive bowel sounds heard. Central nervous system: Awake and alert, nonfocal Extremities: Absent pulses with ischemic changes of the right lower extremity below knee.  Foot is mottled and cool Psychiatry: Normal mood and affect    Data Reviewed: I have personally reviewed following labs and imaging studies  CBC: Recent Labs  Lab 09/15/22 0034 09/15/22 0626 09/16/22 0455 09/16/22 1618 09/17/22 0355  WBC 16.9* 15.9* 12.7* 10.2 8.8  NEUTROABS  --   --   --  7.5  --   HGB 14.1 14.2 13.3 12.1* 11.2*  HCT 40.3 41.0 37.0* 34.4* 32.3*  MCV 89.2 88.2 88.1 88.7 89.7  PLT 163 168 184 162 143*   Basic Metabolic Panel: Recent Labs  Lab 09/12/22 1617 09/13/22 0647 09/14/22 0511 09/15/22 0626 09/16/22 1618 09/17/22 0355  NA 134* 136 133* 131* 129*  --   K 3.9 3.4* 3.8  4.0 3.8  --   CL 97* 102 103 98 98  --   CO2 27 24 24 22 22   --   GLUCOSE 288* 106* 147* 175* 152*  --   BUN 10 7 9 7 9   --   CREATININE 1.01 0.75 0.81 0.78 0.89 0.75  CALCIUM 9.0 8.6* 8.2* 8.4* 7.8*  --    GFR: Estimated Creatinine Clearance: 128.1 mL/min (by C-G formula based on SCr of 0.75 mg/dL). Liver Function Tests: Recent Labs  Lab 09/12/22 1617 09/16/22 1618  AST 11* 17  ALT 10 8  ALKPHOS 128* 84  BILITOT 0.4 0.5  PROT 7.8 6.0*  ALBUMIN 4.0 2.5*    CBG: Recent Labs  Lab 09/16/22 1130 09/16/22 1544 09/16/22 2007 09/17/22 0752 09/17/22 1133  GLUCAP 95 138* 92 141* 229*     Recent Results (from the past 240 hour(s))  MRSA Next Gen by PCR, Nasal     Status: None   Collection Time: 09/13/22 12:14 PM   Specimen: Nasal Mucosa; Nasal Swab  Result Value Ref Range Status   MRSA by PCR Next Gen NOT DETECTED NOT DETECTED Final    Comment: (NOTE) The GeneXpert MRSA Assay (FDA approved for NASAL  specimens only), is one component of a comprehensive MRSA colonization surveillance program. It is not intended to diagnose MRSA infection nor to guide or monitor treatment for MRSA infections. Test performance is not FDA approved in patients less than 5 years old. Performed at [facility], [address] [place], [place] [address]   Culture, blood (Routine X 2) w Reflex to ID Panel     Status: None (Preliminary result)   Collection Time: 09/16/22  4:19 PM   Specimen: BLOOD  Result Value Ref Range Status   Specimen Description BLOOD RIGHT [facility]  Final   Special Requests   Final    BOTTLES DRAWN AEROBIC AND ANAEROBIC Blood Culture adequate volume   Culture   Final    NO GROWTH < 24 HOURS Performed at [facility], [address], [place], [place] [address]    Report Status PENDING  Incomplete  Culture, blood (Routine X 2) w Reflex to ID Panel     Status: None (Preliminary result)   Collection Time: 09/16/22  4:24 PM   Specimen: BLOOD RIGHT HAND  Result Value Ref Range Status   Specimen Description BLOOD RIGHT HAND  Final   Special Requests   Final    BOTTLES DRAWN AEROBIC AND ANAEROBIC Blood Culture adequate volume   Culture   Final    NO GROWTH < 24 HOURS Performed at [facility], [address] [place], [place] [address]    Report Status PENDING  Incomplete  Aerobic Culture w Gram Stain (superficial specimen)     Status: None (Preliminary result)   Collection Time: 09/16/22  7:35 PM   Specimen: Wound  Result Value Ref Range Status   Specimen Description   Final    WOUND Performed at [facility], [address], [place], [place] [address]    Special Requests   Final    RIGHT FOREARM Performed at [facility], [address] [place], [place] [address]    Gram Stain   Final    RARE SQUAMOUS EPITHELIAL CELLS PRESENT ABUNDANT GRAM NEGATIVE RODS FEW GRAM POSITIVE COCCI IN PAIRS Performed at [facility], [address], [place], [place] [address]    Culture PENDING  Incomplete   Report Status PENDING  Incomplete         Radiology Studies: No results found.      Scheduled Meds:  Chlorhexidine Gluconate Cloth  6 each Topical Daily   insulin aspart  0-15 Units Subcutaneous TID WC   nicotine  21 mg Transdermal Daily   sodium chloride flush  3 mL Intravenous Q12H   Continuous Infusions:  sodium chloride     sodium chloride Stopped (09/16/22 1818)   ceFEPime (MAXIPIME) IV Stopped (09/17/22 1148)   heparin 2,000 Units/hr (09/17/22 1150)   lactated ringers 150 mL/hr (09/17/22 1148)   metronidazole Stopped (09/17/22 0522)   vancomycin 1,500 mg (09/17/22 1221)     LOS: 5 days   Time spent 25 minutes    Delfino Lovett, MD [facility] Pager [phone]  If 7PM-7AM, please contact night-coverage [email or web address] 09/17/2022, 2:42 PM

## 2022-09-18 DIAGNOSIS — I2510 Atherosclerotic heart disease of native coronary artery without angina pectoris: Secondary | ICD-10-CM | POA: Diagnosis not present

## 2022-09-18 DIAGNOSIS — I7090 Unspecified atherosclerosis: Secondary | ICD-10-CM | POA: Diagnosis not present

## 2022-09-18 DIAGNOSIS — E11621 Type 2 diabetes mellitus with foot ulcer: Secondary | ICD-10-CM | POA: Diagnosis not present

## 2022-09-18 DIAGNOSIS — E1165 Type 2 diabetes mellitus with hyperglycemia: Secondary | ICD-10-CM | POA: Diagnosis not present

## 2022-09-18 DIAGNOSIS — I998 Other disorder of circulatory system: Secondary | ICD-10-CM | POA: Diagnosis not present

## 2022-09-18 DIAGNOSIS — Z9861 Coronary angioplasty status: Secondary | ICD-10-CM | POA: Diagnosis not present

## 2022-09-18 LAB — BASIC METABOLIC PANEL WITH GFR
Anion gap: 8 (ref 5–15)
BUN: 7 mg/dL (ref 6–20)
CO2: 24 mmol/L (ref 22–32)
Calcium: 7.8 mg/dL — ABNORMAL LOW (ref 8.9–10.3)
Chloride: 99 mmol/L (ref 98–111)
Creatinine, Ser: 0.75 mg/dL (ref 0.61–1.24)
GFR, Estimated: >60 mL/min (ref >60)
Glucose, Bld: 171 mg/dL — ABNORMAL HIGH (ref 70–99)
Potassium: 3.5 mmol/L (ref 3.5–5.1)
Sodium: 131 mmol/L — ABNORMAL LOW (ref 135–145)

## 2022-09-18 LAB — CBC
HCT: 32 % — ABNORMAL LOW (ref 39.0–52.0)
Hemoglobin: 11.4 g/dL — ABNORMAL LOW (ref 13.0–17.0)
MCH: 31 pg (ref 26.0–34.0)
MCHC: 35.6 g/dL (ref 30.0–36.0)
MCV: 87 fL (ref 80.0–100.0)
Platelets: 152 10*3/uL (ref 150–400)
RBC: 3.68 MIL/uL — ABNORMAL LOW (ref 4.22–5.81)
RDW: 13.2 % (ref 11.5–15.5)
WBC: 8 10*3/uL (ref 4.0–10.5)
nRBC: 0 % (ref 0.0–0.2)

## 2022-09-18 LAB — GLUCOSE, CAPILLARY
Glucose-Capillary: 166 mg/dL — ABNORMAL HIGH (ref 70–99)
Glucose-Capillary: 215 mg/dL — ABNORMAL HIGH (ref 70–99)
Glucose-Capillary: 231 mg/dL — ABNORMAL HIGH (ref 70–99)

## 2022-09-18 LAB — HEPARIN LEVEL (UNFRACTIONATED): Heparin Unfractionated: 0.32 [IU]/mL (ref 0.30–0.70)

## 2022-09-18 MED ORDER — POLYETHYLENE GLYCOL 3350 17 G PO PACK
17.0000 g | PACK | Freq: Every day | ORAL | Status: DC
  Administered 2022-09-18 – 2022-09-28 (×9): 17 g via ORAL
  Filled 2022-09-18 (×9): qty 1

## 2022-09-18 MED ORDER — SENNOSIDES-DOCUSATE SODIUM 8.6-50 MG PO TABS
2.0000 | ORAL_TABLET | Freq: Two times a day (BID) | ORAL | Status: DC
  Administered 2022-09-18 – 2022-09-28 (×16): 2 via ORAL
  Filled 2022-09-18 (×20): qty 2

## 2022-09-18 NOTE — Progress Notes (Signed)
ANTICOAGULATION CONSULT NOTE  Pharmacy Consult for heparin Indication:  arterial occlusion of RLE, hx PAD  No Known Allergies  Patient Measurements: Height: 5\' 11"  (180.3 cm) Weight: 86.2 kg (190 lb 0.6 oz) IBW/kg (Calculated) : 75.3 Heparin Dosing Weight: 86.2 kg  Vital Signs: Temp: 97.8 F (36.6 C) (08/04 0345) Temp Source: Oral (08/04 0345) BP: 138/79 (08/04 0300) Pulse Rate: 97 (08/04 0300)  Labs: Recent Labs    09/16/22 1618 09/16/22 2245 09/17/22 0355 09/17/22 0725 09/17/22 1500 09/17/22 2203 09/18/22 0408  HGB 12.1*  --  11.2*  --   --   --  11.4*  HCT 34.4*  --  32.3*  --   --   --  32.0*  PLT 162  --  143*  --   --   --  152  APTT 31  --   --   --   --   --   --   LABPROT 14.5  --   --   --   --   --   --   INR 1.1  --   --   --   --   --   --   HEPARINUNFRC  --    < >  --    < > 0.18* 0.36 0.32  CREATININE 0.89  --  0.75  --   --   --  0.75   < > = values in this interval not displayed.    Estimated Creatinine Clearance: 128.1 mL/min (by C-G formula based on SCr of 0.75 mg/dL).   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   Chicken pox    Coronary artery disease    Inferior ST elevation myocardial infarction in July of 2014. Cardiac catheterization showed 95% distal RCA stenosis and 90% first diagonal stenosis with normal ejection fraction. He underwent PCI in bare-metal stent placement to the distal RCA.   Diabetes mellitus without complication (HCC)    Hypercholesterolemia    Stroke (HCC)    Tobacco use    Assessment: 42 y/o male presenting from outpatient wound care center due to pain and cyanotic vasculature in his right leg. Pharmacy has been consulted to dose heparin. 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   *Patient will need amputation per MD  assessment*  Goal of Therapy:  Heparin level 0.3-0.7 units/ml Monitor platelets by anticoagulation protocol: Yes  8/2 @ 2245 HL < 0.1, Subtherapeutic (heparin rate 1550 un/hr) 8/3 @ 0725 HL 0.24 Subtherapeutic (heparin rate 1850 un/hr) 8/3 1500 HL 0.18 Subtherapeutic  2000>>2200u/hr 8/3 2203 HL 0.36        Therapeutic X 1  8/4 0408 HL 0.32        Therapeutic X 2   Plan:  8/4:  HL @ 0408 = 0.32, therapeutic X 2 - Will continue pt on current rate and recheck HL on 8/5 with AM labs.  Check daily CBC and signs/symptoms of bleeding.  , D Clinical Pharmacist 09/18/2022

## 2022-09-18 NOTE — Progress Notes (Signed)
Pharmacy Antibiotic Note  Martin Mullen is a 42 y.o. male admitted on 09/12/2022 with sepsis and wound infection .  Pharmacy has been consulted for Cefepime and Vancomycin dosing.  -hx Diabetes, recurrent foot ulcers w/ amputations -Repeat angio of right lower extremity with mechanical thrombectomy and stenting on 8/1. Likely AKA next week  Renal function stable. ID consulted.  Plan: Continue Cefepime 2 gm IV q8h    Crcl 128 ml/min  2. Continue Vancomycin  1500 mg q12h   Goal AUC 400-550.   Expected AUC: 483   SCr used: 0.89   Cmin 12.1   3. Will f/u renal fxn, cultures, etc   Height: 5\' 11"  (180.3 cm) Weight: 76.7 kg (169 lb 1.5 oz) IBW/kg (Calculated) : 75.3  Temp (24hrs), Avg:98.9 F (37.2 C), Min:97.8 F (36.6 C), Max:99.9 F (37.7 C)  Recent Labs  Lab 09/14/22 0511 09/14/22 1341 09/15/22 0626 09/16/22 0455 09/16/22 1618 09/16/22 1823 09/17/22 0355 09/18/22 0408  WBC 11.8*   < > 15.9* 12.7* 10.2  --  8.8 8.0  CREATININE 0.81  --  0.78  --  0.89  --  0.75 0.75  LATICACIDVEN  --   --   --   --  0.8 1.4  --   --    < > = values in this interval not displayed.    Estimated Creatinine Clearance: 128.1 mL/min (by C-G formula based on SCr of 0.75 mg/dL).    No Known Allergies  Antimicrobials this admission: Cefazolin  7/30 >> 8/1 Vancomycin  8/2 >>   Cefepime 8/2>>  Microbiology results: 8/2 BCx: NGTD 8/2 CAth tip cx: pending 8/2 foot cx: multiple  7/30 MRSA PCR: neg  Thank you for allowing pharmacy to be a part of this patient's care.   Rodriguez-Guzman PharmD, BCPS 09/18/2022 11:53 AM

## 2022-09-18 NOTE — Progress Notes (Signed)
Martin Mullen and Vascular Surgery  Daily Progress Note   Subjective  -   No complaints.  Feels fine and is aware of planned RLE amputation and OK with it.  Says he spoke to [facility] yesterday and earlier today but they are not currently present.  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 which is cold  Laboratory CBC    Component Value Date/Time   WBC 8.0 09/18/2022 0408   HGB 11.4 (L) 09/18/2022 0408   HGB 17.9 (H) 08/11/2021 0937   HCT 32.0 (L) 09/18/2022 0408   HCT 51.0 08/11/2021 0937   PLT 152 09/18/2022 0408   PLT 261 08/11/2021 0937    BMET    Component Value Date/Time   NA 131 (L) 09/18/2022 0408   NA 140 08/11/2021 0937   NA 139 01/29/2013 0040   K 3.5 09/18/2022 0408   K 4.0 01/29/2013 0040   CL 99 09/18/2022 0408   CL 107 01/29/2013 0040   CO2 24 09/18/2022 0408   CO2 28 01/29/2013 0040   GLUCOSE 171 (H) 09/18/2022 0408   GLUCOSE 228 (H) 01/29/2013 0040   BUN 7 09/18/2022 0408   BUN 11 08/11/2021 0937   BUN 15 01/29/2013 0040   CREATININE 0.75 09/18/2022 0408   CREATININE 0.82 08/11/2022 1455   CALCIUM 7.8 (L) 09/18/2022 0408   CALCIUM 9.2 01/29/2013 0040   GFRNONAA >60 09/18/2022 0408   GFRNONAA >60 01/29/2013 0040   GFRAA 104 11/13/2019 1550   GFRAA >60 01/29/2013 0040    Assessment/Planning: Planned right AKA later this week once POA has signed consent form.  Nursing to notify me if POA arrives so I can obtain consent.  No new issues.  Iline Oven  09/18/2022, 1:35 PM

## 2022-09-18 NOTE — Plan of Care (Signed)
  Problem: Education: Goal: Ability to describe self-care measures that may prevent or decrease complications (Diabetes Survival Skills Education) will improve Outcome: Not Progressing Goal: Individualized Educational Video(s) Outcome: Not Progressing   Problem: Coping: Goal: Ability to adjust to condition or change in health will improve Outcome: Not Progressing   Problem: Fluid Volume: Goal: Ability to maintain a balanced intake and output will improve Outcome: Not Progressing   Problem: Health Behavior/Discharge Planning: Goal: Ability to identify and utilize available resources and services will improve Outcome: Not Progressing Goal: Ability to manage health-related needs will improve Outcome: Not Progressing   Problem: Metabolic: Goal: Ability to maintain appropriate glucose levels will improve Outcome: Not Progressing   Problem: Nutritional: Goal: Maintenance of adequate nutrition will improve Outcome: Not Progressing Goal: Progress toward achieving an optimal weight will improve Outcome: Not Progressing   Problem: Skin Integrity: Goal: Risk for impaired skin integrity will decrease Outcome: Not Progressing   Problem: Tissue Perfusion: Goal: Adequacy of tissue perfusion will improve Outcome: Not Progressing   Problem: Education: Goal: Knowledge of General Education information will improve Description: Including pain rating scale, medication(s)/side effects and non-pharmacologic comfort measures Outcome: Not Progressing   Problem: Health Behavior/Discharge Planning: Goal: Ability to manage health-related needs will improve Outcome: Not Progressing   Problem: Clinical Measurements: Goal: Ability to maintain clinical measurements within normal limits will improve Outcome: Not Progressing Goal: Will remain free from infection Outcome: Not Progressing Goal: Diagnostic test results will improve Outcome: Not Progressing Goal: Respiratory complications will  improve Outcome: Not Progressing Goal: Cardiovascular complication will be avoided Outcome: Not Progressing   Problem: Activity: Goal: Risk for activity intolerance will decrease Outcome: Not Progressing   Problem: Nutrition: Goal: Adequate nutrition will be maintained Outcome: Not Progressing   Problem: Coping: Goal: Level of anxiety will decrease Outcome: Not Progressing   Problem: Elimination: Goal: Will not experience complications related to bowel motility Outcome: Not Progressing Goal: Will not experience complications related to urinary retention Outcome: Not Progressing   Problem: Pain Managment: Goal: General experience of comfort will improve Outcome: Not Progressing   Problem: Safety: Goal: Ability to remain free from injury will improve Outcome: Not Progressing   Problem: Skin Integrity: Goal: Risk for impaired skin integrity will decrease Outcome: Not Progressing   Problem: Education: Goal: Understanding of CV disease, CV risk reduction, and recovery process will improve Outcome: Not Progressing Goal: Individualized Educational Video(s) Outcome: Not Progressing   Problem: Activity: Goal: Ability to return to baseline activity level will improve Outcome: Not Progressing   Problem: Cardiovascular: Goal: Ability to achieve and maintain adequate cardiovascular perfusion will improve Outcome: Not Progressing Goal: Vascular access site(s) Level 0-1 will be maintained Outcome: Not Progressing   Problem: Health Behavior/Discharge Planning: Goal: Ability to safely manage health-related needs after discharge will improve Outcome: Not Progressing   Problem: Fluid Volume: Goal: Hemodynamic stability will improve Outcome: Not Progressing   Problem: Clinical Measurements: Goal: Diagnostic test results will improve Outcome: Not Progressing Goal: Signs and symptoms of infection will decrease Outcome: Not Progressing   Problem: Respiratory: Goal: Ability  to maintain adequate ventilation will improve Outcome: Not Progressing

## 2022-09-18 NOTE — Progress Notes (Signed)
PROGRESS NOTE   HPI was taken from Dr. Huel Cote: Martin Mullen is a 42 y.o. 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   Martin Mullen states that he noticed that his right foot was becoming more painful over the last 2 days, especially with standing.  He denies noticing any swelling in his legs though.  He has noticed some drainage, but states it did not look purulent.  He denies any fever, chills, nausea, vomiting, diarrhea, shortness of breath, chest pain, palpitations.  He notes that he has not been taking his Eliquis for approximately 3 months now.  When asked him why he discontinued, he stated he no longer wanted to take it but did not have any particular reason for that.   Per chart review, patient was seen at the wound care center today.  At the time, it was noted that his left second toe amputation site looks significantly worse with cyanosis.  This was noted to be a change compared to 2 weeks prior.  Due to this, he was instructed to go to the ED.   ED course: On arrival to the ED, patient was normotensive at 126/86 with heart rate of 115.  He was saturating at 98% on room air.  He was afebrile at 98.7.  Initial workup notable for WBC of 15.5, hemoglobin of 17.1, platelets of 238, sodium of 134, glucose of 288, creatinine 1.01 with GFR above 60.  Vascular surgery was consulted and patient started on IV heparin.  TRH contacted for admission.  7/30: S/p angio and stenting on the right foot.  Status post thrombectomy.  Had a abrupt change in his pulse exam last night suggestive of foot ischemia.  Likely failure of [place]gr[place]at per vascular 7/31: Plan for repeat angio and thrombolysis with continuous  tPA infusion today 8/1: Angio and stenting of the right LE, mechanical thrombectomy 8/2: Likely AKA early next week once family decides over the weekend 8/3: Being treated for suspected sepsis with IV antibiotics.  Increased oxycodone dose for better pain control 8/4: Patient mentally clear and agreeable for AKA  Martin Mullen  ZOX:[id number] DOB: March 12, 1980 DOA: 09/12/2022 PCP: [name], NP    Assessment & Plan:   Principal Problem:   Acute lower limb ischemia Active Problems:   Diabetic foot ulcer associated with type 2 diabetes mellitus (HCC)   Recurrent strokes (HCC)   Uncontrolled type 2 diabetes mellitus with hyperglycemia, with long-term current use of insulin (HCC)   HTN (hypertension)   CAD S/P percutaneous coronary angioplasty   Arterial occlusion  Assessment and Plan: Acute right lower limb ischemia: arterial duplex obtained that demonstrates severe atheromatous plaque of the distal right SFA in addition to occlusion of the right popliteal artery stent, although posterior tibial artery remains patent. Likely secondary to noncompliance w/ eliquis. Holding eliquis. Continue on IV heparin. Oxycodone, morphine prn for pain. Continue w/ neurovascular checks  -S/p angio with stenting of the right superficial femoral and popliteal artery.  Status post thrombectomy on 7/30.  Overnight he had absent pulses from the knee down on Doppler.  Foot was becoming cyanotic and cold -Repeat angio of right lower extremity with mechanical thrombectomy and stenting on 8/1 -Patient seems mentally clear and was agreeable with AKA  Sepsis: Fever, tachycardia, hypotension. Diabetic foot ulcer:  involving the second right toe that has been since amputated. Remains an ulcer at his amputation site, wound culture growing gram-negative rods, gram-positive cocci Had low-grade fever, potentially wound infection, continue IV vancomycin, Flagyl, cefepime for now.  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 Has been on chronic Eliquis, but self discontinued 3 months ago. Holding eliquis. Continue on IV heparin.  Seen by oncology.  Recommends discharge on Eliquis and antiplatelet agents   DM2: poorly controlled, HbA1c 7.7. Continue on SSI w/ accuchecks   Hx of CAD: s/p percutaneous coronary angioplasty. Has not been taking eliquis at home. Holding eliquis. Continue on IV heparin for now   HTN: As needed hydralazine and labetalol  Goals of care conversation  Palliative care seen.  Full code for now -he remains at very high risk for vascular complications including ischemia and death   DVT prophylaxis: IV heparin  Code Status: full  Family Communication:  Disposition Plan: likely d/c back home   Level of care: Progressive Status is: Inpatient Remains inpatient appropriate because: severity of illness, requiring IV heparin     Consultants:  Vasc surg  ID Palliative care Oncology  Procedures: Angiogram and stenting on 7/30.  Repeat angiogram on 8/1   Subjective:  Wanting to move forward with this AKA  Objective: Vitals:   09/18/22 1600 09/18/22 1700 09/18/22 1854 09/18/22 2132  BP: (!) 154/86 135/75 (!) 136/102 131/84  Pulse: (!) 113 (!) 107 (!) 116 (!) 110  Resp: 17 14 16 18   Temp: 98.9 F (37.2 C)  (!) 100.5 F (38.1 C) 98.5 F (36.9 C)  TempSrc: Oral  Oral   SpO2: 94% 95% 97% 98%  Weight:      Height:        Intake/Output Summary (Last 24 hours) at 09/18/2022 2300 Last data filed at 09/18/2022 1900 Gross per 24 hour  Intake 1899.47 ml  Output 3550 ml  Net -1650.53 ml   Filed Weights   09/12/22 1616 09/14/22 1324 09/18/22 0500  Weight: 86.2 kg 86.2 kg 76.7 kg    Examination:  General exam: Appears calm and comfortable  Respiratory system: clear breath sounds b/l Cardiovascular system: S1 & S2 +. No rubs, gallops or clicks. Gastrointestinal system: Abdomen is  nondistended, soft and nontender. Hypoactive bowel sounds heard. Central nervous system: Awake and alert, nonfocal Extremities: Absent pulses with ischemic changes of the right lower extremity below knee.  Foot is mottled and cool Psychiatry: Normal mood and affect    Data Reviewed: I have personally reviewed following labs and imaging studies  CBC: Recent Labs  Lab 09/15/22 0626 09/16/22 0455 09/16/22 1618 09/17/22 0355 09/18/22 0408  WBC 15.9* 12.7* 10.2 8.8 8.0  NEUTROABS  --   --  7.5  --   --   HGB 14.2 13.3 12.1* 11.2* 11.4*  HCT 41.0 37.0* 34.4* 32.3* 32.0*  MCV 88.2 88.1 88.7 89.7 87.0  PLT 168 184 162 143* 152   Basic Metabolic Panel: Recent Labs  Lab 09/13/22 0647 09/14/22 0511 09/15/22 0626 09/16/22 1618 09/17/22 0355 09/18/22 0408  NA 136 133* 131* 129*  --  131*  K 3.4* 3.8 4.0 3.8  --  3.5  CL 102 103 98 98  --  99  CO2 24 24 22 22   --  24  GLUCOSE 106* 147* 175* 152*  --  171*  BUN 7 9 7 9   --  7  CREATININE 0.75 0.81 0.78 0.89 0.75 0.75  CALCIUM 8.6* 8.2* 8.4* 7.8*  --  7.8*   GFR: Estimated Creatinine Clearance: 128.1 mL/min (by C-G formula based on SCr of 0.75 mg/dL). Liver Function Tests: Recent Labs  Lab 09/12/22 1617 09/16/22 1618  AST 11* 17  ALT 10 8  ALKPHOS 128* 84  BILITOT 0.4 0.5  PROT 7.8 6.0*  ALBUMIN 4.0 2.5*    CBG: Recent Labs  Lab 09/17/22 1737 09/17/22 2116 09/18/22 0726 09/18/22 1132 09/18/22 1638  GLUCAP 146* 241* 166* 215* 231*     Recent Results (from the past 240 hour(s))  MRSA Next Gen by PCR, Nasal     Status: None   Collection Time: 09/13/22 12:14 PM   Specimen: Nasal Mucosa; Nasal Swab  Result Value Ref Range Status   MRSA by PCR Next Gen NOT DETECTED NOT DETECTED Final    Comment: (NOTE) The GeneXpert MRSA Assay (FDA approved for NASAL specimens only), is one component of a comprehensive MRSA colonization surveillance program. It is not intended to diagnose MRSA infection nor to guide or monitor  treatment for MRSA infections. Test performance is not FDA approved in patients less than 19 years old. Performed at [facility], [address] [place], [place] [address]   Culture, blood (Routine X 2) w Reflex to ID Panel     Status: None (Preliminary result)   Collection Time: 09/16/22  4:19 PM   Specimen: BLOOD  Result Value Ref Range Status   Specimen Description BLOOD RIGHT [facility]  Final   Special Requests   Final    BOTTLES DRAWN AEROBIC AND ANAEROBIC Blood Culture adequate volume   Culture   Final    NO GROWTH 2 DAYS Performed at [facility], [address] [place], [place] [address]    Report Status PENDING  Incomplete  Culture, blood (Routine X 2) w Reflex to ID Panel     Status: None (Preliminary result)   Collection Time: 09/16/22  4:24 PM   Specimen: BLOOD RIGHT HAND  Result Value Ref Range Status   Specimen Description BLOOD RIGHT HAND  Final   Special Requests   Final    BOTTLES DRAWN AEROBIC AND ANAEROBIC Blood Culture adequate volume   Culture   Final    NO GROWTH 2 DAYS Performed at [facility], [address], [place], [place] [address]    Report Status PENDING  Incomplete  Aerobic Culture w Gram Stain (superficial specimen)     Status: Abnormal (Preliminary result)   Collection Time: 09/16/22  7:35 PM   Specimen: Wound  Result Value Ref Range Status   Specimen Description   Final    WOUND Performed at [facility], [address], [place], [place] [address]    Special Requests   Final    RIGHT FOREARM Performed at [facility], [address] [place], [place] [address]    Gram Stain   Final    RARE SQUAMOUS EPITHELIAL CELLS PRESENT ABUNDANT GRAM NEGATIVE RODS FEW GRAM POSITIVE COCCI IN PAIRS Performed at [facility], [address], [place], [place] [address]    Culture MULTIPLE ORGANISMS PRESENT, NONE PREDOMINANT (A)  Final   Report Status PENDING  Incomplete         Radiology  Studies: No results found.      Scheduled Meds:  Chlorhexidine Gluconate Cloth  6 each Topical Daily   insulin aspart  0-15 Units Subcutaneous TID WC  nicotine  21 mg Transdermal Daily   polyethylene glycol  17 g Oral Daily   senna-docusate  2 tablet Oral BID   sodium chloride flush  3 mL Intravenous Q12H   Continuous Infusions:  sodium chloride 250 mL (09/17/22 1548)   sodium chloride Stopped (09/16/22 1818)   ceFEPime (MAXIPIME) IV 200 mL/hr at 09/18/22 1900   heparin 2,200 Units/hr (09/18/22 1900)   metronidazole Stopped (09/18/22 1717)   vancomycin Stopped (09/18/22 1435)     LOS: 6 days   Time spent 25 minutes    Delfino Lovett, MD [facility] Pager [phone]  If 7PM-7AM, please contact night-coverage [email or web address] 09/18/2022, 11:00 PM

## 2022-09-18 NOTE — Progress Notes (Signed)
                                                                                                                                                                                                           Daily Progress Note   Patient Name: Martin Mullen       Date: 09/18/2022 DOB: Mar 05, 1980  Age: 42 y.o. MRN#: [id number] Attending Physician: Delfino Lovett, MD Primary Care Physician: [name], NP Admit Date: 09/12/2022  Reason for Consultation/Follow-up: Establishing goals of care  HPI/Brief Hospital Review: 42 y.o. 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   7/30 s/p angiogram and stent placement, s/p thrombectomy Overnight had abrupt change in circulation status-lost pulses, suggestive of ischemia 7/31 returned to vascular lab for repeat angiogram and thrombolysis with continuous tPA infusion 8/1 returned to vascular lab, angiogram with stenting, mechanical thrombectomy   Likely to need AKA early next week per vascular team    Palliative medicine was consulted for assisting with goals of care conversations.  Subjective: Extensive chart review has been completed prior to meeting patient including labs, vital signs, imaging, progress notes, orders, and available advanced directive documents from current and previous encounters.    Visited with Martin Mullen at his bedside. More awake, alert and able to engage in conversations. He is able to recall our previous visits and conversations. He is clear again today on moving forward with amputation. Aware of importance of therapy and maintaining motivation through  rehab process.  Answered and addressed all questions and concerns. No ongoing acute palliative needs, will continue to shadow and reengage if needed.  Thank you for allowing the Palliative Medicine Team to assist in the care of this patient.  Total time:  25 minutes  Time spent includes: Detailed review of medical records (labs, imaging, vital signs), medically appropriate exam (mental status, respiratory, cardiac, skin), discussed with treatment team, counseling and educating patient, family and staff, documenting clinical information, medication management and coordination of care.  Leeanne Deed, DNP, AGNP-C Palliative Medicine   Please contact Palliative Medicine Team phone at [phone] for questions and concerns.

## 2022-09-19 ENCOUNTER — Ambulatory Visit: Payer: Medicaid Other | Admitting: Physician Assistant

## 2022-09-19 DIAGNOSIS — I639 Cerebral infarction, unspecified: Secondary | ICD-10-CM | POA: Diagnosis not present

## 2022-09-19 DIAGNOSIS — I998 Other disorder of circulatory system: Secondary | ICD-10-CM | POA: Diagnosis not present

## 2022-09-19 DIAGNOSIS — I10 Essential (primary) hypertension: Secondary | ICD-10-CM | POA: Diagnosis not present

## 2022-09-19 DIAGNOSIS — I96 Gangrene, not elsewhere classified: Secondary | ICD-10-CM

## 2022-09-19 DIAGNOSIS — I7090 Unspecified atherosclerosis: Secondary | ICD-10-CM | POA: Diagnosis not present

## 2022-09-19 DIAGNOSIS — E1151 Type 2 diabetes mellitus with diabetic peripheral angiopathy without gangrene: Secondary | ICD-10-CM | POA: Diagnosis not present

## 2022-09-19 DIAGNOSIS — E11621 Type 2 diabetes mellitus with foot ulcer: Secondary | ICD-10-CM | POA: Diagnosis not present

## 2022-09-19 LAB — GLUCOSE, CAPILLARY
Glucose-Capillary: 277 mg/dL — ABNORMAL HIGH (ref 70–99)
Glucose-Capillary: 183 mg/dL — ABNORMAL HIGH (ref 70–99)
Glucose-Capillary: 240 mg/dL — ABNORMAL HIGH (ref 70–99)
Glucose-Capillary: 263 mg/dL — ABNORMAL HIGH (ref 70–99)

## 2022-09-19 LAB — HEPARIN LEVEL (UNFRACTIONATED)
Heparin Unfractionated: 0.28 IU/mL — ABNORMAL LOW (ref 0.30–0.70)
Heparin Unfractionated: 0.36 IU/mL (ref 0.30–0.70)

## 2022-09-19 MED ORDER — HEPARIN BOLUS VIA INFUSION
1300.0000 [IU] | Freq: Once | INTRAVENOUS | Status: AC
  Administered 2022-09-19: 1300 [IU] via INTRAVENOUS
  Filled 2022-09-19: qty 1300

## 2022-09-19 MED ORDER — CHLORHEXIDINE GLUCONATE 4 % EX SOLN
60.0000 mL | Freq: Once | CUTANEOUS | Status: AC
  Administered 2022-09-19: 4 via TOPICAL

## 2022-09-19 MED ORDER — CHLORHEXIDINE GLUCONATE 4 % EX SOLN
60.0000 mL | Freq: Once | CUTANEOUS | Status: AC
  Administered 2022-09-20: 4 via TOPICAL

## 2022-09-19 NOTE — Inpatient Diabetes Management (Signed)
Inpatient Diabetes Program Recommendations  AACE/ADA: New Consensus Statement on Inpatient Glycemic Control  Target Ranges:  Prepandial:   less than 140 mg/dL      Peak postprandial:   less than 180 mg/dL (1-2 hours)      Critically ill patients:  140 - 180 mg/dL    Latest Reference Range & Units 09/18/22 07:26 09/18/22 11:32 09/18/22 16:38 09/19/22 08:24  Glucose-Capillary 70 - 99 mg/dL 295 (H) 621 (H) 308 (H) 277 (H)   Review of Glycemic Control  Diabetes history: DM2 Outpatient Diabetes medications: Lantus 16 units daily, Metformin 1000 mg BID, Ozempic 0-5 mg Qweek Current orders for Inpatient glycemic control: Novolog 0-15 units TID with meals  Inpatient Diabetes Program Recommendations:    Insulin: Please consider ordering Novolog 0-5 units at bedtime and Novolog 3 units TID with meals for meal coverage if patient eats at least 50% of meals.  Diet: If appropriate, please consider changing from Regular diet to Carb Modified diet.  Thanks, Orlando Penner, RN, MSN, CDCES Diabetes Coordinator Inpatient Diabetes Program [phone] (Team Pager from 8am to 5pm)

## 2022-09-19 NOTE — Progress Notes (Signed)
Progress Note    09/19/2022 12:21 PM 4 Days Post-Op  Subjective:  Martin Mullen is a 42 y.o. 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   Patient underwent multiple attempts to revascularize his right lower extremity including tPA without success. Patient now requires a right above the knee amputation. Discussion with Cornelia Copa his POA this morning confirms they wish to proceed with the surgery. It is tentatively scheduled for Tuesday 09/20/2022.    Vitals:   09/19/22 1151 09/19/22 1207  BP: (!) 142/93 (!) 152/93  Pulse: (!) 104 (!) 104  Resp: 20 18  Temp: 99.2 F (37.3 C) 98.6 F (37 C)  SpO2: 96% 96%   Physical Exam: Cardiac:  RRR, with tachycardic Rate, normal S1, S2.  No murmurs appreciated Lungs: Clear throughout on auscultation, no rales, rhonchi or wheezing noted. Incisions: Left groin incision with erythema.  Dressing applied, clean dry and intact.  No hematoma noted Extremities: Lower extremity is warm to touch with palpable pulses.  Right lower extremity below the knee cool to touch with mottled purple foot and toes.  Without Doppler pulses. Abdomen: Positive bowel sounds throughout, soft, nontender and nondistended. Neurologic: Alert and oriented to 2,name and place,  history of CVA x 3.  Patient unable to follow commands appropriately all the time.  CBC    Component Value Date/Time   WBC 11.7 (H) 09/19/2022 0442   RBC 4.04 (L) 09/19/2022 0442   HGB 12.3 (L) 09/19/2022 0442   HGB 17.9 (H) 08/11/2021 0937   HCT 34.7 (L) 09/19/2022 0442   HCT 51.0 08/11/2021 0937   PLT 200 09/19/2022 0442   PLT 261 08/11/2021 0937   MCV 85.9 09/19/2022 0442   MCV 93 08/11/2021 0937   MCV 93 01/29/2013 0040   MCH 30.4 09/19/2022 0442   MCHC 35.4  09/19/2022 0442   RDW 13.2 09/19/2022 0442   RDW 12.4 08/11/2021 0937   RDW 13.8 01/29/2013 0040   LYMPHSABS 1.8 09/16/2022 1618   LYMPHSABS 4.8 (H) 08/11/2021 0937   MONOABS 0.7 09/16/2022 1618   EOSABS 0.1 09/16/2022 1618   EOSABS 0.5 (H) 08/11/2021 0937   BASOSABS 0.1 09/16/2022 1618   BASOSABS 0.1 08/11/2021 0937    BMET    Component Value Date/Time   NA 131 (L) 09/19/2022 0442   NA 140 08/11/2021 0937   NA 139 01/29/2013 0040   K 3.8 09/19/2022 0442   K 4.0 01/29/2013 0040   CL 97 (L) 09/19/2022 0442   CL 107 01/29/2013 0040   CO2 24 09/19/2022 0442   CO2 28 01/29/2013 0040   GLUCOSE 248 (H) 09/19/2022 0442   GLUCOSE 228 (H) 01/29/2013 0040   BUN 7 09/19/2022 0442   BUN 11 08/11/2021 0937   BUN 15 01/29/2013 0040   CREATININE 0.78 09/19/2022 0442   CREATININE 0.82 08/11/2022 1455   CALCIUM 8.4 (L) 09/19/2022 0442   CALCIUM 9.2 01/29/2013 0040   GFRNONAA >60 09/19/2022 0442   GFRNONAA >60 01/29/2013 0040   GFRAA 104 11/13/2019 1550   GFRAA >60 01/29/2013 0040    INR    Component Value Date/Time   INR 1.1 09/16/2022 1618     Intake/Output Summary (Last 24 hours) at 09/19/2022 1221 Last data filed at 09/19/2022 1000 Gross per 24 hour  Intake 1006 ml  Output  1250 ml  Net -244 ml     Assessment/Plan:  42 y.o. male is s/p Multiple attempts at revascularization of his right lower extremities with the use of tPA without success.  4 Days Post-Op   PLAN: Vascular surgery plan on taking patient to the operating room for right amputation tomorrow 09/20/2022.  I had a detailed conversation this morning with the patient's power of [name] and the patient.  We discussed in detail the procedure, benefits, risks, and complications.  I answered all their questions this morning.  We also discussed in detail the possibility of the patient getting a prosthetic and learning to walk again.  I informed him I would be more than glad to try to assist with that process  after he has done healing.  Patient will be made n.p.o. after midnight.  Both the patient and medical power of attorney verbalized the need for the right above-the-knee amputation.  They would like to proceed as soon as possible.  DVT prophylaxis: Heparin infusion  I discussed in detail the plan with Dr. Levora Dredge MD and he agrees with the plan   Marcie Bal [facility] 09/19/2022 12:21 PM

## 2022-09-19 NOTE — Progress Notes (Signed)
Date of Admission:  09/12/2022     ID: Martin Mullen is a 42 y.o. male Principal Problem:   Acute lower limb ischemia Active Problems:   HTN (hypertension)   Uncontrolled type 2 diabetes mellitus with hyperglycemia, with long-term current use of insulin (HCC)   CAD S/P percutaneous coronary angioplasty   Recurrent strokes (HCC)   Diabetic foot ulcer associated with type 2 diabetes mellitus (HCC)   Arterial occlusion    Subjective: Patient waiting for bka  Medications:   Chlorhexidine Gluconate Cloth  6 each Topical Daily   insulin aspart  0-15 Units Subcutaneous TID WC   nicotine  21 mg Transdermal Daily   polyethylene glycol  17 g Oral Daily   senna-docusate  2 tablet Oral BID   sodium chloride flush  3 mL Intravenous Q12H    Objective: Vital signs in last 24 hours: Patient Vitals for the past 24 hrs:  BP Temp Temp src Pulse Resp SpO2  09/19/22 1604 136/82 99.9 F (37.7 C) Oral (!) 108 20 95 %  09/19/22 1207 (!) 152/93 98.6 F (37 C) Oral (!) 104 18 96 %  09/19/22 1151 (!) 142/93 99.2 F (37.3 C) -- (!) 104 20 96 %  09/19/22 0826 (!) 130/93 98.8 F (37.1 C) -- (!) 109 20 99 %  09/19/22 0323 (!) 142/83 99 F (37.2 C) -- (!) 104 19 96 %  09/18/22 2300 136/87 99.8 F (37.7 C) Oral (!) 109 19 96 %  09/18/22 2132 131/84 98.5 F (36.9 C) -- (!) 110 18 98 %       PHYSICAL EXAM:  General: Alert,  Lungs: Clear to auscultation bilaterally. No Wheezing or Rhonchi. No rales. Heart: Regular rate and rhythm, no murmur, rub or gallop. Abdomen: Soft, non-tender,not distended. Bowel sounds normal. No masses Extremities: rt foot gangrene  Skin: No rashes or lesions. Or bruising Lymph: Cervical, supraclavicular normal. Neurologic: Grossly non-focal  Lab Results    Latest Ref Rng & Units 09/19/2022    4:42 AM 09/18/2022    4:08 AM 09/17/2022    3:55 AM  CBC  WBC 4.0 - 10.5 K/uL 11.7  8.0  8.8   Hemoglobin 13.0 - 17.0 g/dL 41.3  24.4  01.0   Hematocrit 39.0 - 52.0 %  34.7  32.0  32.3   Platelets 150 - 400 K/uL 200  152  143        Latest Ref Rng & Units 09/19/2022    4:42 AM 09/18/2022    4:08 AM 09/17/2022    3:55 AM  CMP  Glucose 70 - 99 mg/dL 272  536    BUN 6 - 20 mg/dL 7  7    Creatinine 6.44 - 1.24 mg/dL 0.34  7.42  5.95   Sodium 135 - 145 mmol/L 131  131    Potassium 3.5 - 5.1 mmol/L 3.8  3.5    Chloride 98 - 111 mmol/L 97  99    CO2 22 - 32 mmol/L 24  24    Calcium 8.9 - 10.3 mg/dL 8.4  7.8        Microbiology:  Studies/Results: No results found.   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

## 2022-09-19 NOTE — Progress Notes (Signed)
ANTICOAGULATION CONSULT NOTE  Pharmacy Consult for heparin Indication:  arterial occlusion of RLE, hx PAD  No Known Allergies  Patient Measurements: Height: 5\' 11"  (180.3 cm) Weight: 76.7 kg (169 lb 1.5 oz) IBW/kg (Calculated) : 75.3 Heparin Dosing Weight: 86.2 kg  Vital Signs: Temp: 99 F (37.2 C) (08/05 0323) Temp Source: Oral (08/04 2300) BP: 142/83 (08/05 0323) Pulse Rate: 104 (08/05 0323)  Labs: Recent Labs    09/16/22 1618 09/16/22 2245 09/17/22 0355 09/17/22 0725 09/17/22 2203 09/18/22 0408 09/19/22 0442  HGB 12.1*  --  11.2*  --   --  11.4* 12.3*  HCT 34.4*  --  32.3*  --   --  32.0* 34.7*  PLT 162  --  143*  --   --  152 200  APTT 31  --   --   --   --   --   --   LABPROT 14.5  --   --   --   --   --   --   INR 1.1  --   --   --   --   --   --   HEPARINUNFRC  --    < >  --    < > 0.36 0.32 0.21*  CREATININE 0.89  --  0.75  --   --  0.75 0.78   < > = values in this interval not displayed.    Estimated Creatinine Clearance: 128.1 mL/min (by C-G formula based on SCr of 0.78 mg/dL).   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   Chicken pox    Coronary artery disease    Inferior ST elevation myocardial infarction in July of 2014. Cardiac catheterization showed 95% distal RCA stenosis and 90% first diagonal stenosis with normal ejection fraction. He underwent PCI in bare-metal stent placement to the distal RCA.   Diabetes mellitus without complication (HCC)    Hypercholesterolemia    Stroke (HCC)    Tobacco use    Assessment: 42 y/o male presenting from outpatient wound care center due to pain and cyanotic vasculature in his right leg. Pharmacy has been consulted to dose heparin. 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   *Patient will need amputation per MD  assessment*  Goal of Therapy:  Heparin level 0.3-0.7 units/ml Monitor platelets by anticoagulation protocol: Yes  8/2 @ 2245 HL < 0.1, Subtherapeutic (heparin rate 1550 un/hr) 8/3 @ 0725 HL 0.24 Subtherapeutic (heparin rate 1850 un/hr) 8/3 1500 HL 0.18 Subtherapeutic  2000>>2200u/hr 8/3 2203 HL 0.36        Therapeutic X 1  8/4 0408 HL 0.32        Therapeutic X 2  8/5 0441 HL 0.21        SUBtherapeutic   Plan:  8/5:  HL @ 0441 = 0.21, SUBtherapeutic  - Will order heparin 1300 units IV X 1 bolus and increase drip rate to 2350 units/hr. - Will recheck HL 6 hrs after rate change  Check daily CBC and signs/symptoms of bleeding.  , D Clinical Pharmacist 09/19/2022

## 2022-09-19 NOTE — TOC Progression Note (Signed)
Transition of Care [facility]) - Progression Note    Patient Details  Name: Martin Mullen MRN: [id number] Date of Birth: 11/30/1980  Transition of Care [facility]) CM/SW Contact  Darolyn Rua, [place] Phone Number: 09/19/2022, 9:35 AM  Clinical Narrative:     CSW met with patient's POA [name], reviewed and provided copy of the POA paperwork in patient's chart that was completed August 2023. She reports no further questions/concerns at this time.        Expected Discharge Plan and Services                                               Social Determinants of Health (SDOH) Interventions SDOH Screenings   Food Insecurity: No Food Insecurity (06/29/2022)  Housing: Low Risk  (06/29/2022)  Transportation Needs: No Transportation Needs (06/29/2022)  Utilities: Not At Risk (06/29/2022)  Alcohol Screen: Low Risk  (07/01/2020)  Depression (PHQ2-9): High Risk (05/10/2022)  Financial Resource Strain: Low Risk  (06/10/2022)  Tobacco Use: High Risk (09/12/2022)    Readmission Risk Interventions     No data to display

## 2022-09-19 NOTE — Plan of Care (Signed)
  Problem: Education: Goal: Ability to describe self-care measures that may prevent or decrease complications (Diabetes Survival Skills Education) will improve Outcome: Progressing Goal: Individualized Educational Video(s) Outcome: Progressing   Problem: Coping: Goal: Ability to adjust to condition or change in health will improve Outcome: Progressing   Problem: Fluid Volume: Goal: Ability to maintain a balanced intake and output will improve Outcome: Progressing   Problem: Health Behavior/Discharge Planning: Goal: Ability to identify and utilize available resources and services will improve Outcome: Progressing Goal: Ability to manage health-related needs will improve Outcome: Progressing   Problem: Metabolic: Goal: Ability to maintain appropriate glucose levels will improve Outcome: Progressing   Problem: Nutritional: Goal: Maintenance of adequate nutrition will improve Outcome: Progressing Goal: Progress toward achieving an optimal weight will improve Outcome: Progressing   Problem: Skin Integrity: Goal: Risk for impaired skin integrity will decrease Outcome: Progressing   Problem: Tissue Perfusion: Goal: Adequacy of tissue perfusion will improve Outcome: Progressing   Problem: Education: Goal: Knowledge of General Education information will improve Description: Including pain rating scale, medication(s)/side effects and non-pharmacologic comfort measures Outcome: Progressing   Problem: Health Behavior/Discharge Planning: Goal: Ability to manage health-related needs will improve Outcome: Progressing   Problem: Clinical Measurements: Goal: Ability to maintain clinical measurements within normal limits will improve Outcome: Progressing Goal: Will remain free from infection Outcome: Progressing Goal: Diagnostic test results will improve Outcome: Progressing Goal: Respiratory complications will improve Outcome: Progressing Goal: Cardiovascular complication will  be avoided Outcome: Progressing   Problem: Activity: Goal: Risk for activity intolerance will decrease Outcome: Progressing   Problem: Nutrition: Goal: Adequate nutrition will be maintained Outcome: Progressing   Problem: Coping: Goal: Level of anxiety will decrease Outcome: Progressing   Problem: Elimination: Goal: Will not experience complications related to bowel motility Outcome: Progressing Goal: Will not experience complications related to urinary retention Outcome: Progressing   Problem: Pain Managment: Goal: General experience of comfort will improve Outcome: Progressing   Problem: Safety: Goal: Ability to remain free from injury will improve Outcome: Progressing   Problem: Skin Integrity: Goal: Risk for impaired skin integrity will decrease Outcome: Progressing   Problem: Education: Goal: Understanding of CV disease, CV risk reduction, and recovery process will improve Outcome: Progressing Goal: Individualized Educational Video(s) Outcome: Progressing   Problem: Activity: Goal: Ability to return to baseline activity level will improve Outcome: Progressing   Problem: Cardiovascular: Goal: Ability to achieve and maintain adequate cardiovascular perfusion will improve Outcome: Progressing Goal: Vascular access site(s) Level 0-1 will be maintained Outcome: Progressing   Problem: Health Behavior/Discharge Planning: Goal: Ability to safely manage health-related needs after discharge will improve Outcome: Progressing   Problem: Fluid Volume: Goal: Hemodynamic stability will improve Outcome: Progressing   Problem: Clinical Measurements: Goal: Diagnostic test results will improve Outcome: Progressing Goal: Signs and symptoms of infection will decrease Outcome: Progressing   Problem: Respiratory: Goal: Ability to maintain adequate ventilation will improve Outcome: Progressing   

## 2022-09-19 NOTE — Progress Notes (Signed)
PROGRESS NOTE   HPI was taken from Dr. Huel Cote: Martin Mullen is a 42 y.o. 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   [name] states that he noticed that his right foot was becoming more painful over the last 2 days, especially with standing.  He denies noticing any swelling in his legs though.  He has noticed some drainage, but states it did not look purulent.  He denies any fever, chills, nausea, vomiting, diarrhea, shortness of breath, chest pain, palpitations.  He notes that he has not been taking his Eliquis for approximately 3 months now.  When asked him why he discontinued, he stated he no longer wanted to take it but did not have any particular reason for that.   Per chart review, patient was seen at the wound care center today.  At the time, it was noted that his left second toe amputation site looks significantly worse with cyanosis.  This was noted to be a change compared to 2 weeks prior.  Due to this, he was instructed to go to the ED.   ED course: On arrival to the ED, patient was normotensive at 126/86 with heart rate of 115.  He was saturating at 98% on room air.  He was afebrile at 98.7.  Initial workup notable for WBC of 15.5, hemoglobin of 17.1, platelets of 238, sodium of 134, glucose of 288, creatinine 1.01 with GFR above 60.  Vascular surgery was consulted and patient started on IV heparin.  [facility] contacted for admission.  7/30: S/p angio and stenting on the right foot.  Status post thrombectomy.  Had a abrupt change in his pulse exam last night suggestive of foot ischemia.  Likely failure of Aggrastat per vascular 7/31: Plan for repeat angio and thrombolysis with continuous  tPA infusion today 8/1: Angio and stenting of the right LE, mechanical thrombectomy 8/2: Likely AKA early next week once family decides over the weekend 8/3: Being treated for suspected sepsis with IV antibiotics.  Increased oxycodone dose for better pain control 8/4: Patient mentally clear and agreeable for AKA 8/5: Plan for AKA on 8/6 per vascular surgery  Martin Mullen  ZOX:[id number] DOB: 02/24/1980 DOA: 09/12/2022 PCP: [name], NP    Assessment & Plan:   Principal Problem:   Acute lower limb ischemia Active Problems:   Diabetic foot ulcer associated with type 2 diabetes mellitus (HCC)   Recurrent strokes (HCC)   Uncontrolled type 2 diabetes mellitus with hyperglycemia, with long-term current use of insulin (HCC)   HTN (hypertension)   CAD S/P percutaneous coronary angioplasty   Arterial occlusion  Assessment and Plan: Acute right lower limb ischemia: arterial duplex obtained that demonstrates severe atheromatous plaque of the distal right SFA in addition to occlusion of the right popliteal artery stent, although posterior tibial artery remains patent. Likely secondary to noncompliance w/ eliquis. Holding eliquis. Continue on IV heparin. Oxycodone, morphine prn for pain. Continue w/ neurovascular checks  -S/p angio with stenting of the right superficial femoral and popliteal artery.  Status post thrombectomy on 7/30.  Overnight he had absent pulses from the knee down on Doppler.  Foot was becoming cyanotic and cold -Repeat angio of right lower extremity with mechanical thrombectomy and stenting on 8/1 -Patient  and POA agreeable with AKA.  AKA scheduled for tomorrow  Sepsis: Fever, tachycardia, hypotension on 8/2 likely due to diabetic foot ulcer/gangrene Diabetic foot ulcer:  involving the second right toe that has been since amputated. Remains an ulcer at his amputation site, wound culture growing gram-negative rods, gram-positive cocci in pairs Had low-grade fever,  potentially wound infection, continue IV vancomycin, Flagyl, cefepime for now.  Removed femoral triple-lumen and Foley on 8/3.  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 Has been on chronic Eliquis, but self discontinued 3 months ago. Holding eliquis. Continue on IV heparin.  Seen by oncology.  Recommends discharge on Eliquis and antiplatelet agents   DM2: poorly controlled, HbA1c 7.7. Continue on SSI w/ accuchecks   Hx of CAD: s/p percutaneous coronary angioplasty. Has not been taking eliquis at home. Holding eliquis. Continue on IV heparin for now   HTN: As needed hydralazine and labetalol  Goals of care conversation  Palliative care seen.  Full code for now   DVT prophylaxis: IV heparin  Code Status: full  Family Communication:  Disposition Plan: likely d/c back home   Level of care: Med-Surg Status is: Inpatient Remains inpatient appropriate because: severity of illness, requiring IV heparin     Consultants:  Vasc surg  ID Palliative care Oncology  Procedures: Angiogram and stenting on 7/30.  Repeat angiogram on 8/1.  Right AKA planned for tomorrow   Subjective:  POA at bedside.  Patient and POA in agreement with AKA  Objective: Vitals:   09/19/22 0323 09/19/22 0826 09/19/22 1151 09/19/22 1207  BP: (!) 142/83 (!) 130/93 (!) 142/93 (!) 152/93  Pulse: (!) 104 (!) 109 (!) 104 (!) 104  Resp: 19 20 20 18   Temp: 99 F (37.2 C) 98.8 F (37.1 C) 99.2 F (37.3 C) 98.6 F (37 C)  TempSrc:    Oral  SpO2: 96% 99% 96% 96%  Weight:      Height:        Intake/Output Summary (Last 24 hours) at 09/19/2022 1302 Last data filed at 09/19/2022 1000 Gross per 24 hour  Intake 1006 ml  Output 1250 ml  Net -244 ml   Filed Weights   09/12/22 1616 09/14/22 1324 09/18/22 0500  Weight: 86.2 kg 86.2 kg 76.7 kg    Examination:  General exam: Appears calm and comfortable  Respiratory system: clear breath sounds  b/l Cardiovascular system: S1 & S2 +. No rubs, gallops or clicks. Gastrointestinal system: Abdomen is nondistended, soft and nontender. Hypoactive bowel sounds heard. Central nervous system: Awake and alert, nonfocal Extremities: Absent pulses with ischemic changes of the right lower extremity below knee.  Foot is mottled and cool Psychiatry: Normal mood and affect    Data Reviewed: I have personally reviewed following labs and imaging studies  CBC: Recent Labs  Lab 09/16/22 0455 09/16/22 1618 09/17/22 0355 09/18/22 0408 09/19/22 0442  WBC 12.7* 10.2 8.8 8.0 11.7*  NEUTROABS  --  7.5  --   --   --   HGB 13.3 12.1* 11.2* 11.4* 12.3*  HCT 37.0* 34.4* 32.3* 32.0* 34.7*  MCV 88.1 88.7 89.7 87.0 85.9  PLT 184 162 143* 152 200   Basic Metabolic Panel: Recent Labs  Lab 09/14/22 0511 09/15/22 0626 09/16/22 1618 09/17/22 0355 09/18/22 0408 09/19/22 0442  NA 133* 131* 129*  --  131* 131*  K 3.8 4.0 3.8  --  3.5 3.8  CL 103 98 98  --  99 97*  CO2  24 22 22   --  24 24  GLUCOSE 147* 175* 152*  --  171* 248*  BUN 9 7 9   --  7 7  CREATININE 0.81 0.78 0.89 0.75 0.75 0.78  CALCIUM 8.2* 8.4* 7.8*  --  7.8* 8.4*   GFR: Estimated Creatinine Clearance: 128.1 mL/min (by C-G formula based on SCr of 0.78 mg/dL). Liver Function Tests: Recent Labs  Lab 09/12/22 1617 09/16/22 1618  AST 11* 17  ALT 10 8  ALKPHOS 128* 84  BILITOT 0.4 0.5  PROT 7.8 6.0*  ALBUMIN 4.0 2.5*    CBG: Recent Labs  Lab 09/18/22 0726 09/18/22 1132 09/18/22 1638 09/19/22 0824 09/19/22 1146  GLUCAP 166* 215* 231* 277* 183*     Recent Results (from the past 240 hour(s))  MRSA Next Gen by PCR, Nasal     Status: None   Collection Time: 09/13/22 12:14 PM   Specimen: Nasal Mucosa; Nasal Swab  Result Value Ref Range Status   MRSA by PCR Next Gen NOT DETECTED NOT DETECTED Final    Comment: (NOTE) The GeneXpert MRSA Assay (FDA approved for NASAL specimens only), is one component of a comprehensive MRSA  colonization surveillance program. It is not intended to diagnose MRSA infection nor to guide or monitor treatment for MRSA infections. Test performance is not FDA approved in patients less than 20 years old. Performed at [facility], [address] [place], [place] [address]   Culture, blood (Routine X 2) w Reflex to ID Panel     Status: None (Preliminary result)   Collection Time: 09/16/22  4:19 PM   Specimen: BLOOD  Result Value Ref Range Status   Specimen Description BLOOD RIGHT [facility]  Final   Special Requests   Final    BOTTLES DRAWN AEROBIC AND ANAEROBIC Blood Culture adequate volume   Culture   Final    NO GROWTH 3 DAYS Performed at [facility], [address], [place], [place] [address]    Report Status PENDING  Incomplete  Culture, blood (Routine X 2) w Reflex to ID Panel     Status: None (Preliminary result)   Collection Time: 09/16/22  4:24 PM   Specimen: BLOOD RIGHT HAND  Result Value Ref Range Status   Specimen Description BLOOD RIGHT HAND  Final   Special Requests   Final    BOTTLES DRAWN AEROBIC AND ANAEROBIC Blood Culture adequate volume   Culture   Final    NO GROWTH 3 DAYS Performed at [facility], [address], [place], [place] [address]    Report Status PENDING  Incomplete  Aerobic Culture w Gram Stain (superficial specimen)     Status: Abnormal (Preliminary result)   Collection Time: 09/16/22  7:35 PM   Specimen: Wound  Result Value Ref Range Status   Specimen Description   Final    WOUND Performed at [facility], [address], [place], [place] [address]    Special Requests   Final    RIGHT FOREARM Performed at [facility], [address], [place], [place] [address]    Gram Stain   Final    RARE SQUAMOUS EPITHELIAL CELLS PRESENT ABUNDANT GRAM NEGATIVE RODS FEW GRAM POSITIVE COCCI IN PAIRS Performed at [facility], [address] [place], [place] [address]    Culture MULTIPLE  ORGANISMS PRESENT, NONE PREDOMINANT (A)  Final   Report Status PENDING  Incomplete         Radiology Studies: No results found.  Scheduled Meds:  Chlorhexidine Gluconate Cloth  6 each Topical Daily   insulin aspart  0-15 Units Subcutaneous TID WC   nicotine  21 mg Transdermal Daily   polyethylene glycol  17 g Oral Daily   senna-docusate  2 tablet Oral BID   sodium chloride flush  3 mL Intravenous Q12H   Continuous Infusions:  sodium chloride 250 mL (09/17/22 1548)   sodium chloride Stopped (09/16/22 1818)   ceFEPime (MAXIPIME) IV 2 g (09/19/22 1110)   heparin 2,350 Units/hr (09/19/22 2440)   metronidazole 500 mg (09/19/22 0549)   vancomycin 1,500 mg (09/19/22 0308)     LOS: 7 days   Time spent 25 minutes    Delfino Lovett, MD [facility] Pager [phone]  If 7PM-7AM, please contact night-coverage [email or web address] 09/19/2022, 1:02 PM

## 2022-09-19 NOTE — Progress Notes (Signed)
ANTICOAGULATION CONSULT NOTE  Pharmacy Consult for heparin Indication:  arterial occlusion of RLE, hx PAD  No Known Allergies  Patient Measurements: Height: 5\' 11"  (180.3 cm) Weight: 76.7 kg (169 lb 1.5 oz) IBW/kg (Calculated) : 75.3 Heparin Dosing Weight: 86.2 kg  Vital Signs: Temp: 98.6 F (37 C) (08/05 1207) Temp Source: Oral (08/05 1207) BP: 152/93 (08/05 1207) Pulse Rate: 104 (08/05 1207)  Labs: Recent Labs    09/16/22 1618 09/16/22 2245 09/17/22 0355 09/17/22 0725 09/18/22 0408 09/19/22 0442 09/19/22 1212  HGB 12.1*  --  11.2*  --  11.4* 12.3*  --   HCT 34.4*  --  32.3*  --  32.0* 34.7*  --   PLT 162  --  143*  --  152 200  --   APTT 31  --   --   --   --   --   --   LABPROT 14.5  --   --   --   --   --   --   INR 1.1  --   --   --   --   --   --   HEPARINUNFRC  --    < >  --    < > 0.32 0.21* 0.28*  CREATININE 0.89  --  0.75  --  0.75 0.78  --    < > = values in this interval not displayed.    Estimated Creatinine Clearance: 128.1 mL/min (by C-G formula based on SCr of 0.78 mg/dL).   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   Chicken pox    Coronary artery disease    Inferior ST elevation myocardial infarction in July of 2014. Cardiac catheterization showed 95% distal RCA stenosis and 90% first diagonal stenosis with normal ejection fraction. He underwent PCI in bare-metal stent placement to the distal RCA.   Diabetes mellitus without complication (HCC)    Hypercholesterolemia    Stroke (HCC)    Tobacco use    Assessment: 42 y/o male presenting from outpatient wound care center due to pain and cyanotic vasculature in his right leg. Pharmacy has been consulted to dose heparin. 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   *Patient will need amputation per MD  assessment*  Goal of Therapy:  Heparin level 0.3-0.7 units/ml Monitor platelets by anticoagulation protocol: Yes  8/2 @ 2245 HL < 0.1, Subtherapeutic (heparin rate 1550 un/hr) 8/3 @ 0725 HL 0.24 Subtherapeutic (heparin rate 1850 un/hr) 8/3 1500 HL 0.18 Subtherapeutic  2000>>2200u/hr 8/3 2203 HL 0.36        Therapeutic X 1  8/4 0408 HL 0.32        Therapeutic X 2  8/5 0441 HL 0.21        SUBtherapeutic  8/5 1212 HL 0.28 SUBtherapeutic  Plan:  - Will order heparin 1300 units IV X 1 bolus and increase drip rate to 2500 units/hr. - Will recheck HL 6 hrs after rate change  - Check daily CBC and signs/symptoms of bleeding.  Bettey Costa Clinical Pharmacist 09/19/2022

## 2022-09-19 NOTE — Progress Notes (Signed)
ANTICOAGULATION CONSULT NOTE  Pharmacy Consult for heparin Indication:  arterial occlusion of RLE, hx PAD  No Known Allergies  Patient Measurements: Height: 5\' 11"  (180.3 cm) Weight: 76.7 kg (169 lb 1.5 oz) IBW/kg (Calculated) : 75.3 Heparin Dosing Weight: 86.2 kg  Vital Signs: Temp: 100 F (37.8 C) (08/05 2028) Temp Source: Oral (08/05 2028) BP: 124/81 (08/05 1937) Pulse Rate: 104 (08/05 2028)  Labs: Recent Labs    09/17/22 0355 09/17/22 0725 09/18/22 0408 09/19/22 0442 09/19/22 1212 09/19/22 2154  HGB 11.2*  --  11.4* 12.3*  --   --   HCT 32.3*  --  32.0* 34.7*  --   --   PLT 143*  --  152 200  --   --   HEPARINUNFRC  --    < > 0.32 0.21* 0.28* 0.36  CREATININE 0.75  --  0.75 0.78  --   --    < > = values in this interval not displayed.    Estimated Creatinine Clearance: 128.1 mL/min (by C-G formula based on SCr of 0.78 mg/dL).   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   Chicken pox    Coronary artery disease    Inferior ST elevation myocardial infarction in July of 2014. Cardiac catheterization showed 95% distal RCA stenosis and 90% first diagonal stenosis with normal ejection fraction. He underwent PCI in bare-metal stent placement to the distal RCA.   Diabetes mellitus without complication (HCC)    Hypercholesterolemia    Stroke (HCC)    Tobacco use    Assessment: 42 y/o male presenting from outpatient wound care center due to pain and cyanotic vasculature in his right leg. Pharmacy has been consulted to dose heparin. 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   *Patient will need amputation per MD assessment*  Goal of Therapy:  Heparin level 0.3-0.7 units/ml Monitor platelets by anticoagulation protocol: Yes  8/2 @ 2245 HL < 0.1, Subtherapeutic (heparin  rate 1550 un/hr) 8/3 @ 0725 HL 0.24 Subtherapeutic (heparin rate 1850 un/hr) 8/3 1500 HL 0.18 Subtherapeutic  2000>>2200u/hr 8/3 2203 HL 0.36        Therapeutic X 1  8/4 0408 HL 0.32        Therapeutic X 2  8/5 0441 HL 0.21        SUBtherapeutic  8/5 1212 HL 0.28 SUBtherapeutic 8/05 2154 HL 0.36 Therapeutic x 1  Plan:  - Continue drip rate at 2500 units/hr. - Will recheck w/ AM labs to confirm  - Check daily CBC and signs/symptoms of bleeding.  Otelia Sergeant, PharmD, [facility] 09/19/2022 11:27 PM

## 2022-09-20 ENCOUNTER — Inpatient Hospital Stay: Payer: Medicaid Other | Admitting: Anesthesiology

## 2022-09-20 ENCOUNTER — Encounter: Payer: Self-pay | Admitting: Internal Medicine

## 2022-09-20 ENCOUNTER — Encounter
Admission: EM | Disposition: A | Discharge status: Skilled Nursing Facility | Payer: Medicaid Other | Source: Ambulatory Visit | Attending: Osteopathic Medicine

## 2022-09-20 ENCOUNTER — Other Ambulatory Visit: Payer: Self-pay

## 2022-09-20 DIAGNOSIS — E11621 Type 2 diabetes mellitus with foot ulcer: Secondary | ICD-10-CM | POA: Diagnosis not present

## 2022-09-20 DIAGNOSIS — I96 Gangrene, not elsewhere classified: Secondary | ICD-10-CM | POA: Diagnosis not present

## 2022-09-20 DIAGNOSIS — E1165 Type 2 diabetes mellitus with hyperglycemia: Secondary | ICD-10-CM | POA: Diagnosis not present

## 2022-09-20 DIAGNOSIS — I998 Other disorder of circulatory system: Secondary | ICD-10-CM | POA: Diagnosis not present

## 2022-09-20 DIAGNOSIS — I7090 Unspecified atherosclerosis: Secondary | ICD-10-CM | POA: Diagnosis not present

## 2022-09-20 HISTORY — PX: AMPUTATION: SHX166

## 2022-09-20 LAB — HEPARIN LEVEL (UNFRACTIONATED): Heparin Unfractionated: 0.22 IU/mL — ABNORMAL LOW (ref 0.30–0.70)

## 2022-09-20 LAB — GLUCOSE, CAPILLARY
Glucose-Capillary: 217 mg/dL — ABNORMAL HIGH (ref 70–99)
Glucose-Capillary: 175 mg/dL — ABNORMAL HIGH (ref 70–99)
Glucose-Capillary: 166 mg/dL — ABNORMAL HIGH (ref 70–99)
Glucose-Capillary: 154 mg/dL — ABNORMAL HIGH (ref 70–99)
Glucose-Capillary: 220 mg/dL — ABNORMAL HIGH (ref 70–99)

## 2022-09-20 SURGERY — AMPUTATION, ABOVE KNEE
Anesthesia: General | Site: Knee | Laterality: Right

## 2022-09-20 MED ORDER — PROPOFOL 10 MG/ML IV BOLUS
INTRAVENOUS | Status: AC
  Filled 2022-09-20: qty 60

## 2022-09-20 MED ORDER — PHENYLEPHRINE HCL-NACL 20-0.9 MG/250ML-% IV SOLN
INTRAVENOUS | Status: AC
  Filled 2022-09-20: qty 250

## 2022-09-20 MED ORDER — SUCCINYLCHOLINE CHLORIDE 200 MG/10ML IV SOSY
PREFILLED_SYRINGE | INTRAVENOUS | Status: DC | PRN
  Administered 2022-09-20: 50 mg via INTRAVENOUS

## 2022-09-20 MED ORDER — ONDANSETRON HCL 4 MG/2ML IJ SOLN
INTRAMUSCULAR | Status: DC | PRN
  Administered 2022-09-20: 4 mg via INTRAVENOUS

## 2022-09-20 MED ORDER — SODIUM CHLORIDE (PF) 0.9 % IJ SOLN
INTRAMUSCULAR | Status: AC
  Filled 2022-09-20: qty 50

## 2022-09-20 MED ORDER — HYDROMORPHONE HCL 1 MG/ML IJ SOLN
INTRAMUSCULAR | Status: AC
  Filled 2022-09-20: qty 1

## 2022-09-20 MED ORDER — HYDROMORPHONE HCL 1 MG/ML IJ SOLN
INTRAMUSCULAR | Status: DC | PRN
  Administered 2022-09-20: .5 mg via INTRAVENOUS

## 2022-09-20 MED ORDER — FENTANYL CITRATE (PF) 100 MCG/2ML IJ SOLN
INTRAMUSCULAR | Status: DC | PRN
  Administered 2022-09-20 (×2): 50 ug via INTRAVENOUS

## 2022-09-20 MED ORDER — LACTATED RINGERS IV SOLN: INTRAVENOUS | Status: DC | PRN

## 2022-09-20 MED ORDER — OXYCODONE HCL 5 MG PO TABS: 5.0000 mg | ORAL_TABLET | Freq: Once | ORAL | Status: DC | PRN

## 2022-09-20 MED ORDER — MIDAZOLAM HCL 2 MG/2ML IJ SOLN
INTRAMUSCULAR | Status: DC | PRN
  Administered 2022-09-20 (×2): 1 mg via INTRAVENOUS

## 2022-09-20 MED ORDER — MIDAZOLAM HCL 2 MG/2ML IJ SOLN
INTRAMUSCULAR | Status: AC
  Filled 2022-09-20: qty 2

## 2022-09-20 MED ORDER — ROCURONIUM BROMIDE 100 MG/10ML IV SOLN
INTRAVENOUS | Status: DC | PRN
  Administered 2022-09-20: 20 mg via INTRAVENOUS

## 2022-09-20 MED ORDER — ASPIRIN 81 MG PO TBEC
81.0000 mg | DELAYED_RELEASE_TABLET | Freq: Every day | ORAL | Status: DC
  Administered 2022-09-20 – 2022-09-28 (×9): 81 mg via ORAL
  Filled 2022-09-20 (×9): qty 1

## 2022-09-20 MED ORDER — ACETAMINOPHEN 10 MG/ML IV SOLN
INTRAVENOUS | Status: AC
  Filled 2022-09-20: qty 100

## 2022-09-20 MED ORDER — NALOXONE HCL 2 MG/2ML IJ SOSY
PREFILLED_SYRINGE | INTRAMUSCULAR | Status: AC
  Filled 2022-09-20: qty 2

## 2022-09-20 MED ORDER — FENTANYL CITRATE (PF) 100 MCG/2ML IJ SOLN: 25.0000 ug | INTRAMUSCULAR | Status: DC | PRN

## 2022-09-20 MED ORDER — KETAMINE HCL 10 MG/ML IJ SOLN
INTRAMUSCULAR | Status: DC | PRN
  Administered 2022-09-20: 30 mg via INTRAVENOUS
  Administered 2022-09-20: 20 mg via INTRAVENOUS

## 2022-09-20 MED ORDER — PHENYLEPHRINE HCL (PRESSORS) 10 MG/ML IV SOLN
INTRAVENOUS | Status: DC | PRN
  Administered 2022-09-20: 160 ug via INTRAVENOUS
  Administered 2022-09-20 (×2): 80 ug via INTRAVENOUS

## 2022-09-20 MED ORDER — SODIUM CHLORIDE FLUSH 0.9 % IV SOLN
INTRAVENOUS | Status: DC | PRN
  Administered 2022-09-20: 50 mL

## 2022-09-20 MED ORDER — BUPIVACAINE HCL (PF) 0.5 % IJ SOLN
INTRAMUSCULAR | Status: AC
  Filled 2022-09-20: qty 30

## 2022-09-20 MED ORDER — OXYCODONE HCL 5 MG/5ML PO SOLN: 5.0000 mg | Freq: Once | ORAL | Status: DC | PRN

## 2022-09-20 MED ORDER — KETAMINE HCL 50 MG/5ML IJ SOSY
PREFILLED_SYRINGE | INTRAMUSCULAR | Status: AC
  Filled 2022-09-20: qty 5

## 2022-09-20 MED ORDER — 0.9 % SODIUM CHLORIDE (POUR BTL) OPTIME
TOPICAL | Status: DC | PRN
  Administered 2022-09-20: 1000 mL

## 2022-09-20 MED ORDER — ACETAMINOPHEN 10 MG/ML IV SOLN
INTRAVENOUS | Status: DC | PRN
  Administered 2022-09-20: 1000 mg via INTRAVENOUS

## 2022-09-20 MED ORDER — PROPOFOL 10 MG/ML IV BOLUS
INTRAVENOUS | Status: DC | PRN
  Administered 2022-09-20 (×2): 50 mg via INTRAVENOUS

## 2022-09-20 MED ORDER — BUPIVACAINE LIPOSOME 1.3 % IJ SUSP
INTRAMUSCULAR | Status: AC
  Filled 2022-09-20: qty 20

## 2022-09-20 MED ORDER — HEPARIN BOLUS VIA INFUSION
1300.0000 [IU] | Freq: Once | INTRAVENOUS | Status: AC
  Administered 2022-09-20: 1300 [IU] via INTRAVENOUS
  Filled 2022-09-20: qty 1300

## 2022-09-20 MED ORDER — PHENYLEPHRINE HCL-NACL 20-0.9 MG/250ML-% IV SOLN
INTRAVENOUS | Status: DC | PRN
  Administered 2022-09-20: 20 ug/min via INTRAVENOUS

## 2022-09-20 MED ORDER — NALOXONE HCL 2 MG/2ML IJ SOSY
100.0000 ug | PREFILLED_SYRINGE | INTRAMUSCULAR | Status: AC | PRN
  Administered 2022-09-20 (×2): 100 ug via INTRAVENOUS

## 2022-09-20 MED ORDER — FENTANYL CITRATE (PF) 100 MCG/2ML IJ SOLN
INTRAMUSCULAR | Status: AC
  Filled 2022-09-20: qty 2

## 2022-09-20 MED ORDER — SUGAMMADEX SODIUM 200 MG/2ML IV SOLN
INTRAVENOUS | Status: DC | PRN
  Administered 2022-09-20: 200 mg via INTRAVENOUS

## 2022-09-20 MED ORDER — LIDOCAINE HCL (CARDIAC) PF 100 MG/5ML IV SOSY
PREFILLED_SYRINGE | INTRAVENOUS | Status: DC | PRN
  Administered 2022-09-20: 80 mg via INTRAVENOUS

## 2022-09-20 SURGICAL SUPPLY — 54 items
ADH SKN CLS APL DERMABOND .7 (GAUZE/BANDAGES/DRESSINGS) ×1
APL PRP STRL LF DISP 70% ISPRP (MISCELLANEOUS) ×1
BLADE SAW SAG 25.4X90 (BLADE) ×2 IMPLANT
BLADE SURG SZ10 CARB STEEL (BLADE) ×2 IMPLANT
BNDG CMPR 5X4 CHSV STRCH STRL (GAUZE/BANDAGES/DRESSINGS) ×1
BNDG CMPR STD VLCR NS LF 5.8X6 (GAUZE/BANDAGES/DRESSINGS) ×1
BNDG COHESIVE 4X5 TAN STRL LF (GAUZE/BANDAGES/DRESSINGS) ×2 IMPLANT
BNDG ELASTIC 6X5.8 VLCR NS LF (GAUZE/BANDAGES/DRESSINGS) ×2 IMPLANT
BNDG GAUZE DERMACEA FLUFF 4 (GAUZE/BANDAGES/DRESSINGS) ×2 IMPLANT
BNDG GZE DERMACEA 4 6PLY (GAUZE/BANDAGES/DRESSINGS) ×1
CANISTER WOUND CARE 500ML ATS (WOUND CARE) IMPLANT
CHLORAPREP W/TINT 26 (MISCELLANEOUS) ×2 IMPLANT
DERMABOND ADVANCED .7 DNX12 (GAUZE/BANDAGES/DRESSINGS) ×2 IMPLANT
DRAPE INCISE IOBAN 66X45 STRL (DRAPES) ×2 IMPLANT
DRSG GAUZE FLUFF 36X18 (GAUZE/BANDAGES/DRESSINGS) ×2 IMPLANT
DRSG VAC GRANUFOAM MED (GAUZE/BANDAGES/DRESSINGS) IMPLANT
ELECT CAUTERY BLADE 6.4 (BLADE) ×2 IMPLANT
ELECT REM PT RETURN 9FT ADLT (ELECTROSURGICAL) ×1
ELECTRODE REM PT RTRN 9FT ADLT (ELECTROSURGICAL) ×2 IMPLANT
GLOVE BIO SURGEON STRL SZ7 (GLOVE) ×2 IMPLANT
GLOVE SURG SYN 8.0 (GLOVE) ×1 IMPLANT
GLOVE SURG SYN 8.0 PF PI (GLOVE) ×2 IMPLANT
GOWN STRL REUS W/ TWL LRG LVL3 (GOWN DISPOSABLE) ×4 IMPLANT
GOWN STRL REUS W/ TWL XL LVL3 (GOWN DISPOSABLE) ×2 IMPLANT
GOWN STRL REUS W/TWL LRG LVL3 (GOWN DISPOSABLE) ×2
GOWN STRL REUS W/TWL XL LVL3 (GOWN DISPOSABLE) ×1
HANDLE YANKAUER SUCT BULB TIP (MISCELLANEOUS) ×2 IMPLANT
KIT TURNOVER KIT A (KITS) ×2 IMPLANT
MANIFOLD NEPTUNE II (INSTRUMENTS) ×2 IMPLANT
MAT ABSORB FLUID 56X50 GRAY (MISCELLANEOUS) ×2 IMPLANT
NDL HYPO 21X1.5 SAFETY (NEEDLE) ×2 IMPLANT
NEEDLE HYPO 21X1.5 SAFETY (NEEDLE) ×1 IMPLANT
NS IRRIG 500ML POUR BTL (IV SOLUTION) ×2 IMPLANT
PACK EXTREMITY ARMC (MISCELLANEOUS) ×2 IMPLANT
PAD PREP OB/GYN DISP 24X41 (PERSONAL CARE ITEMS) ×2 IMPLANT
SPONGE T-LAP 18X18 ~~LOC~~+RFID (SPONGE) ×4 IMPLANT
STAPLER SKIN PROX 35W (STAPLE) IMPLANT
STOCKINETTE M/LG 89821 (MISCELLANEOUS) ×2 IMPLANT
SUT ETHIBOND 0 36 GRN (SUTURE) IMPLANT
SUT MNCRL 4-0 (SUTURE) ×2
SUT MNCRL 4-0 27XMFL (SUTURE) ×2
SUT SILK 2 0 (SUTURE)
SUT SILK 2-0 18XBRD TIE 12 (SUTURE) IMPLANT
SUT SILK 3 0 (SUTURE) ×1
SUT SILK 3-0 18XBRD TIE 12 (SUTURE) ×2 IMPLANT
SUT VIC AB 0 CT1 36 (SUTURE) ×12 IMPLANT
SUT VIC AB 3-0 SH 27 (SUTURE) ×2
SUT VIC AB 3-0 SH 27X BRD (SUTURE) ×4 IMPLANT
SUT VICRYL PLUS ABS 0 54 (SUTURE) ×2 IMPLANT
SUTURE MNCRL 4-0 27XMF (SUTURE) ×2 IMPLANT
SYR 20ML LL LF (SYRINGE) ×4 IMPLANT
TAPE UMBILICAL 1/8 X36 TWILL (MISCELLANEOUS) ×2 IMPLANT
TRAP FLUID SMOKE EVACUATOR (MISCELLANEOUS) ×2 IMPLANT
WATER STERILE IRR 500ML POUR (IV SOLUTION) ×2 IMPLANT

## 2022-09-20 NOTE — Op Note (Signed)
  [facility]   OPERATIVE NOTE   PROCEDURE:  Right above-the-knee amputation  PRE-OPERATIVE DIAGNOSIS: Right foot gangrene  POST-OPERATIVE DIAGNOSIS: same as above  SURGEON:  Renford Dills, MD  ASSISTANT(S): Rolla Plate, NP  ANESTHESIA: general  ESTIMATED BLOOD LOSS: 50 cc  FINDING(S): Healthy appearing muscle bellies   SPECIMEN(S):  Right above-the-knee amputation  INDICATIONS:   Martin Mullen is a 42 y.o. male who presents with right foot gangrene.  The patient is scheduled for a right above-the-knee amputation.  I discussed in depth with the patient the risks, benefits, and alternatives to this procedure.  The patient is aware that the risk of this operation included but are not limited to:  bleeding, infection, myocardial infarction, stroke, death, failure to heal amputation wound, and possible need for more proximal amputation.  The patient is aware of the risks and agrees proceed forward with the procedure.  DESCRIPTION: After full informed written consent was obtained from the patient, the patient was taken to the operating room, and placed supine upon the operating table.  Prior to induction, the patient received IV antibiotics.  The patient was then prepped and draped in the standard fashion for a right above-the-knee amputation.  After obtaining adequate anesthesia, the patient was prepped and draped in the standard fashion for a above-the-knee amputation.  I marked out the anterior and posterior flaps for a fish-mouth type of amputation.  I made the incisions for these flaps, and then dissected through the subcutaneous tissue, fascia, and muscles circumferentially.  I elevated  the periosteal tissue 4-5 cm more proximal than the anterior skin flap.  I then transected the femur with a power saw at this level.  Then I smoothed out the rough edges of the bone.  At this point, the specimen was passed off the field as the above-the-knee amputation.  At  this point, I clamped all visibly bleeding arteries and veins using a combination of suture ligation with silk suture and electrocautery.   Bleeding continued to be controlled with electrocautery and suture ligature.  The stump was washed off with sterile normal saline and no further active bleeding was noted.  I reapproximated the anterior and posterior fascia  with interrupted stitches of 0 Vicryl.  This was completed along the entire length of anterior and posterior fascia until there were no more loose space in the fascial line. The subcutaneous tissue was then approximated with 2-0 vicryl sutures. The skin was then  reapproximated with staples.  The stump was washed off and dried.  The incision was dressed with Xeroform and ABD pads, and  then fluffs were applied.  Kerlix was wrapped around the leg and then gently an ACE wrap was applied.  A large Ioban was then placed over the ACE wrap to secure the dressing. The patient was then awakened and take to the recovery room in stable condition.   COMPLICATIONS: none  CONDITION: stable  Martin Mullen  09/20/2022, 1:16 PM   This note was created with Dragon Medical transcription system. Any errors in dictation are purely unintentional.

## 2022-09-20 NOTE — Progress Notes (Signed)
PROGRESS NOTE   HPI was taken from Dr. Huel Cote: Martin Mullen is a 42 y.o. 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   Mr. Yockel states that he noticed that his right foot was becoming more painful over the last 2 days, especially with standing.  He denies noticing any swelling in his legs though.  He has noticed some drainage, but states it did not look purulent.  He denies any fever, chills, nausea, vomiting, diarrhea, shortness of breath, chest pain, palpitations.  He notes that he has not been taking his Eliquis for approximately 3 months now.  When asked him why he discontinued, he stated he no longer wanted to take it but did not have any particular reason for that.   Per chart review, patient was seen at the wound care center today.  At the time, it was noted that his left second toe amputation site looks significantly worse with cyanosis.  This was noted to be a change compared to 2 weeks prior.  Due to this, he was instructed to go to the ED.   ED course: On arrival to the ED, patient was normotensive at 126/86 with heart rate of 115.  He was saturating at 98% on room air.  He was afebrile at 98.7.  Initial workup notable for WBC of 15.5, hemoglobin of 17.1, platelets of 238, sodium of 134, glucose of 288, creatinine 1.01 with GFR above 60.  Vascular surgery was consulted and patient started on IV heparin.  [facility] contacted for admission.  7/30: S/p angio and stenting on the right foot.  Status post thrombectomy.  Had a abrupt change in his pulse exam last night suggestive of foot ischemia.  Likely failure of Aggrastat per vascular 7/31: Plan for repeat angio and thrombolysis with continuous  tPA infusion today 8/1: Angio and stenting of the right LE, mechanical thrombectomy 8/2: Likely AKA early next week once family decides over the weekend 8/3: Being treated for suspected sepsis with IV antibiotics.  Increased oxycodone dose for better pain control 8/4: Patient mentally clear and agreeable for AKA 8/5: Plan for AKA on 8/6 per vascular surgery 8/6: Right above-the-knee amputation   Martin ORILEY  Mullen:[id number] DOB: 1980/05/15 DOA: 09/12/2022 PCP: [name], NP    Assessment & Plan:   Principal Problem:   Acute lower limb ischemia Active Problems:   Diabetic foot ulcer associated with type 2 diabetes mellitus (HCC)   Recurrent strokes (HCC)   Uncontrolled type 2 diabetes mellitus with hyperglycemia, with long-term current use of insulin (HCC)   HTN (hypertension)   CAD S/P percutaneous coronary angioplasty   Arterial occlusion   Gangrene (HCC)  Assessment and Plan: Acute right lower limb ischemia: arterial duplex obtained that demonstrates severe atheromatous plaque of the distal right SFA in addition to occlusion of the right popliteal artery stent, although posterior tibial artery remains patent. Likely secondary to noncompliance w/ eliquis. Holding eliquis. Continue on IV heparin. Oxycodone, morphine prn for pain. Continue w/ neurovascular checks  -S/p angio with stenting of the right superficial femoral and popliteal artery.  Status post thrombectomy on 7/30.  Overnight he had absent pulses from the knee down on Doppler.  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 Remains an ulcer at his amputation site, wound culture growing  gram-negative rods, gram-positive cocci in pairs continue IV vancomycin, Flagyl, cefepime for now, likely transition to oral antibiotic in next 1 to 2 days.  Removed femoral triple-lumen and Foley on 8/3.  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 Has been on chronic Eliquis, but self discontinued 3 months ago. Holding eliquis. Continue on IV heparin.  Seen by oncology.  Recommends discharge on Eliquis and antiplatelet agents per oncology   DM2: poorly controlled, HbA1c 7.7. Continue on SSI w/ accuchecks   Hx of CAD: s/p percutaneous coronary angioplasty. Has not been taking eliquis at home. Holding eliquis. Continue on IV heparin for now   HTN: As needed hydralazine and labetalol  Goals of care conversation  Palliative care seen.  Full code for now   DVT prophylaxis: IV heparin  Code Status: full  Family Communication:  Disposition Plan: likely SNF  Level of care: Med-Surg Status is: Inpatient Remains inpatient appropriate because: severity of illness, requiring IV heparin     Consultants:  Vasc surg  ID Palliative care Oncology  Procedures: Angiogram and stenting on 7/30.  Repeat angiogram on 8/1.  Right AKA planned for today   Subjective:  No new issues.  Mentally prepared for AKA today  Objective: Vitals:   09/20/22 1500 09/20/22 1515 09/20/22 1530 09/20/22 1545  BP: 108/76 104/66 104/78 102/73  Pulse: 81 79 81 78  Resp: 14 (!) 8 12 (!) 9  Temp:  97.7 F (36.5 C)    TempSrc:      SpO2: 97% 95% 99% 97%  Weight:      Height:        Intake/Output Summary (Last 24 hours) at 09/20/2022 1559 Last data filed at 09/20/2022 1338 Gross per 24 hour  Intake 600 ml  Output 800 ml  Net -200 ml   Filed Weights   09/14/22 1324 09/18/22 0500 09/20/22 1107  Weight: 86.2 kg 76.7 kg 76.7 kg    Examination:  General exam: Appears calm and comfortable  Respiratory system: clear breath sounds b/l Cardiovascular system:  S1 & S2 +. No rubs, gallops or clicks. Gastrointestinal system: Abdomen is nondistended, soft and nontender. Hypoactive bowel sounds heard. Central nervous system: Awake and alert, nonfocal Extremities: Absent pulses with ischemic changes of the right lower extremity below knee.  Foot is mottled and cool Psychiatry: Normal mood and affect    Data Reviewed: I have personally reviewed following labs and imaging studies  CBC: Recent Labs  Lab 09/16/22 1618 09/17/22 0355 09/18/22 0408 09/19/22 0442 09/20/22 0429  WBC 10.2 8.8 8.0 11.7* 13.9*  NEUTROABS 7.5  --   --   --   --   HGB 12.1* 11.2* 11.4* 12.3* 11.3*  HCT 34.4* 32.3* 32.0* 34.7* 32.3*  MCV 88.7 89.7 87.0 85.9 88.0  PLT 162 143* 152 200 195   Basic Metabolic Panel: Recent Labs  Lab 09/15/22 0626 09/16/22 1618 09/17/22 0355 09/18/22 0408 09/19/22 0442 09/20/22 0429  NA 131* 129*  --  131* 131* 132*  K 4.0 3.8  --  3.5 3.8 3.9  CL 98 98  --  99 97* 100  CO2 22 22  --  24  24 24  GLUCOSE 175* 152*  --  171* 248* 235*  BUN 7 9  --  7 7 7   CREATININE 0.78 0.89 0.75 0.75 0.78 0.69  CALCIUM 8.4* 7.8*  --  7.8* 8.4* 8.1*   GFR: Estimated Creatinine Clearance: 128.1 mL/min (by C-G formula based on SCr of 0.69 mg/dL). Liver Function Tests: Recent Labs  Lab 09/16/22 1618  AST 17  ALT 8  ALKPHOS 84  BILITOT 0.5  PROT 6.0*  ALBUMIN 2.5*    CBG: Recent Labs  Lab 09/19/22 1649 09/19/22 2053 09/20/22 0828 09/20/22 1110 09/20/22 1354  GLUCAP 240* 263* 217* 175* 166*     Recent Results (from the past 240 hour(s))  MRSA Next Gen by PCR, Nasal     Status: None   Collection Time: 09/13/22 12:14 PM   Specimen: Nasal Mucosa; Nasal Swab  Result Value Ref Range Status   MRSA by PCR Next Gen NOT DETECTED NOT DETECTED Final    Comment: (NOTE) The GeneXpert MRSA Assay (FDA approved for NASAL specimens only), is one component of a comprehensive MRSA colonization surveillance program. It is not intended to diagnose  MRSA infection nor to guide or monitor treatment for MRSA infections. Test performance is not FDA approved in patients less than 60 years old. Performed at [facility], [address] [place], [place] [address]   Culture, blood (Routine X 2) w Reflex to ID Panel     Status: None (Preliminary result)   Collection Time: 09/16/22  4:19 PM   Specimen: BLOOD  Result Value Ref Range Status   Specimen Description BLOOD RIGHT [facility]  Final   Special Requests   Final    BOTTLES DRAWN AEROBIC AND ANAEROBIC Blood Culture adequate volume   Culture   Final    NO GROWTH 4 DAYS Performed at [facility], [address] [place], [place] [address]    Report Status PENDING  Incomplete  Culture, blood (Routine X 2) w Reflex to ID Panel     Status: None (Preliminary result)   Collection Time: 09/16/22  4:24 PM   Specimen: BLOOD RIGHT HAND  Result Value Ref Range Status   Specimen Description BLOOD RIGHT HAND  Final   Special Requests   Final    BOTTLES DRAWN AEROBIC AND ANAEROBIC Blood Culture adequate volume   Culture   Final    NO GROWTH 4 DAYS Performed at [facility], [address], [place], [place] [address]    Report Status PENDING  Incomplete  Aerobic Culture w Gram Stain (superficial specimen)     Status: Abnormal   Collection Time: 09/16/22  7:35 PM   Specimen: Wound  Result Value Ref Range Status   Specimen Description   Final    WOUND Performed at [facility], [address], [place], [place] [address]    Special Requests   Final    RIGHT FOREARM Performed at [facility], [address], [place], [place] [address]    Gram Stain   Final    RARE SQUAMOUS EPITHELIAL CELLS PRESENT ABUNDANT GRAM NEGATIVE RODS FEW GRAM POSITIVE COCCI IN PAIRS Performed at [facility], [address], [place], [place] [address]    Culture MULTIPLE ORGANISMS PRESENT, NONE PREDOMINANT (A)  Final   Report Status 09/19/2022 FINAL  Final          Radiology Studies: No results found.      Scheduled Meds:  [MAR Hold] Chlorhexidine Gluconate Cloth  6  each Topical Daily   [MAR Hold] insulin aspart  0-15 Units Subcutaneous TID WC   [MAR Hold] nicotine  21 mg Transdermal Daily   [MAR Hold] polyethylene glycol  17 g Oral Daily   [MAR Hold] senna-docusate  2 tablet Oral BID   [MAR Hold] sodium chloride flush  3 mL Intravenous Q12H   Continuous Infusions:  [MAR Hold] sodium chloride 250 mL (09/17/22 1548)   sodium chloride 75 mL/hr at 09/19/22 1428   [MAR Hold] ceFEPime (MAXIPIME) IV 2 g (09/20/22 6578)   heparin Stopped (09/20/22 1117)   [MAR Hold] metronidazole 500 mg (09/20/22 0648)   [MAR Hold] vancomycin 1,500 mg (09/20/22 0045)     LOS: 8 days   Time spent 25 minutes    Delfino Lovett, MD [facility] Pager [phone]  If 7PM-7AM, please contact night-coverage [email or web address] 09/20/2022, 3:59 PM

## 2022-09-20 NOTE — Inpatient Diabetes Management (Signed)
Inpatient Diabetes Program Recommendations  AACE/ADA: New Consensus Statement on Inpatient Glycemic Control  Target Ranges:  Prepandial:   less than 140 mg/dL      Peak postprandial:   less than 180 mg/dL (1-2 hours)      Critically ill patients:  140 - 180 mg/dL    Latest Reference Range & Units 09/19/22 08:24 09/19/22 11:46 09/19/22 16:49 09/19/22 20:53  Glucose-Capillary 70 - 99 mg/dL 098 (H) 119 (H) 147 (H) 263 (H)   Review of Glycemic Control  Diabetes history: DM2 Outpatient Diabetes medications: Lantus 16 units daily, Metformin 1000 mg BID, Ozempic 0-5 mg Qweek Current orders for Inpatient glycemic control: Novolog 0-15 units TID with meals   Inpatient Diabetes Program Recommendations:     Insulin: Patient is currently NPO and CBG 235 mg/dl this morning. Please consider ordering Semglee 5 units Q24H, adding Novolog 0-5 units at bedtime and once diet resumed, Novolog 3 units TID with meals for meal coverage if patient eats at least 50% of meals.  Thanks, Orlando Penner, RN, MSN, CDCES Diabetes Coordinator Inpatient Diabetes Program [phone] (Team Pager from 8am to 5pm)

## 2022-09-20 NOTE — Plan of Care (Signed)
Returned from surgery. Alert and oriented to person and situation at this time. Vital signs stable.  Cornell Barman 

## 2022-09-20 NOTE — Progress Notes (Signed)
Noted patients' eyes to be open with fixed stare, no blink reflex to hand in front of eye. Does not speak upon request. Squeezes hands moderately bilat upon request. Remains diaphoretic.  PERRL 3/2 bilat. Dr. Lorette Ang notified and at bedside.No new orders.

## 2022-09-20 NOTE — Plan of Care (Signed)
  Problem: Education: Goal: Ability to describe self-care measures that may prevent or decrease complications (Diabetes Survival Skills Education) will improve Outcome: Progressing Goal: Individualized Educational Video(s) Outcome: Progressing   Problem: Coping: Goal: Ability to adjust to condition or change in health will improve Outcome: Progressing   Problem: Fluid Volume: Goal: Ability to maintain a balanced intake and output will improve Outcome: Progressing   Problem: Health Behavior/Discharge Planning: Goal: Ability to identify and utilize available resources and services will improve Outcome: Progressing Goal: Ability to manage health-related needs will improve Outcome: Progressing   Problem: Metabolic: Goal: Ability to maintain appropriate glucose levels will improve Outcome: Progressing   Problem: Nutritional: Goal: Maintenance of adequate nutrition will improve Outcome: Progressing Goal: Progress toward achieving an optimal weight will improve Outcome: Progressing   Problem: Skin Integrity: Goal: Risk for impaired skin integrity will decrease Outcome: Progressing   Problem: Tissue Perfusion: Goal: Adequacy of tissue perfusion will improve Outcome: Progressing   Problem: Education: Goal: Knowledge of General Education information will improve Description: Including pain rating scale, medication(s)/side effects and non-pharmacologic comfort measures Outcome: Progressing   Problem: Health Behavior/Discharge Planning: Goal: Ability to manage health-related needs will improve Outcome: Progressing   Problem: Clinical Measurements: Goal: Ability to maintain clinical measurements within normal limits will improve Outcome: Progressing Goal: Will remain free from infection Outcome: Progressing Goal: Diagnostic test results will improve Outcome: Progressing Goal: Respiratory complications will improve Outcome: Progressing Goal: Cardiovascular complication will  be avoided Outcome: Progressing   Problem: Activity: Goal: Risk for activity intolerance will decrease Outcome: Progressing   Problem: Nutrition: Goal: Adequate nutrition will be maintained Outcome: Progressing   Problem: Coping: Goal: Level of anxiety will decrease Outcome: Progressing   Problem: Elimination: Goal: Will not experience complications related to bowel motility Outcome: Progressing Goal: Will not experience complications related to urinary retention Outcome: Progressing   Problem: Pain Managment: Goal: General experience of comfort will improve Outcome: Progressing   Problem: Safety: Goal: Ability to remain free from injury will improve Outcome: Progressing   Problem: Skin Integrity: Goal: Risk for impaired skin integrity will decrease Outcome: Progressing   Problem: Education: Goal: Understanding of CV disease, CV risk reduction, and recovery process will improve Outcome: Progressing Goal: Individualized Educational Video(s) Outcome: Progressing   Problem: Activity: Goal: Ability to return to baseline activity level will improve Outcome: Progressing   Problem: Cardiovascular: Goal: Ability to achieve and maintain adequate cardiovascular perfusion will improve Outcome: Progressing Goal: Vascular access site(s) Level 0-1 will be maintained Outcome: Progressing   Problem: Health Behavior/Discharge Planning: Goal: Ability to safely manage health-related needs after discharge will improve Outcome: Progressing   Problem: Fluid Volume: Goal: Hemodynamic stability will improve Outcome: Progressing   Problem: Clinical Measurements: Goal: Diagnostic test results will improve Outcome: Progressing Goal: Signs and symptoms of infection will decrease Outcome: Progressing   Problem: Respiratory: Goal: Ability to maintain adequate ventilation will improve Outcome: Progressing   

## 2022-09-20 NOTE — Anesthesia Preprocedure Evaluation (Addendum)
Anesthesia Evaluation  Patient identified by MRN, date of birth, ID band Patient confused    Reviewed: Allergy & Precautions, H&P , NPO status , Patient's Chart, lab work & pertinent test results  History of Anesthesia Complications Negative for: history of anesthetic complications  Airway Mallampati: I  TM Distance: >3 FB Neck ROM: Full    Dental  (+) Edentulous Upper, Edentulous Lower, Dental Advidsory Given   Pulmonary neg shortness of breath, neg COPD, neg recent URI, Current Smoker and Patient abstained from smoking.   Pulmonary exam normal        Cardiovascular hypertension, (-) angina + CAD, + Past MI and + Cardiac Stents (2014)  (-) CABG negative cardio ROS Normal cardiovascular exam(-) dysrhythmias (-) Valvular Problems/Murmurs  Study Conclusions   - Left ventricle: The cavity size was normal. There was mild  concentric hypertrophy. Systolic function was normal. The  estimated ejection fraction was in the range of 55% to 60%. Wall  motion was normal; there were no regional wall motion  abnormalities.  - Left atrium: The atrium was mildly dilated.    Neuro/Psych  PSYCHIATRIC DISORDERS      negative neurological ROS  negative psych ROS   GI/Hepatic negative GI ROS, Neg liver ROS,GERD  ,,  Endo/Other  diabetes, Poorly Controlled    Renal/GU negative Renal ROS  negative genitourinary   Musculoskeletal   Abdominal   Peds  Hematology negative hematology ROS (+)   Anesthesia Other Findings Patient took his ozempic on 5/10.   Past Medical History: 08/28/2012: Acute transmural inferior wall MI (HCC)     Comment:  .5 x 12 mm Veri-flex stent non-DES. No date: Chicken pox No date: Coronary artery disease     Comment:  Inferior ST elevation myocardial infarction in July of               2014. Cardiac catheterization showed 95% distal RCA               stenosis and 90% first diagonal stenosis with normal                ejection fraction. He underwent PCI in bare-metal stent               placement to the distal RCA. No date: Diabetes mellitus without complication (HCC) No date: Hypercholesterolemia No date: Stroke [facility]) No date: Tobacco use  Past Surgical History: No date: CORONARY ANGIOPLASTY WITH STENT PLACEMENT 09/03/2019: DEBRIDEMENT AND CLOSURE WOUND; N/A     Comment:  Procedure: Debridement and closure of scrotal wound;                Surgeon: Allena Napoleon, MD;  Location: [facility];  Service: Plastics;  Laterality: N/A; 07/25/2019: DEBRIDEMENT AND CLOSURE WOUND; N/A     Comment:  Procedure: DEBRIDEMENT AND CLOSURE WOUND;  Surgeon:               Allena Napoleon, MD;  Location: [facility];  Service:               Plastics;  Laterality: N/A; 07/20/2019: INCISION AND DRAINAGE ABSCESS; N/A     Comment:  Procedure: INCISION AND DRAINAGE ABSCESS;  Surgeon:               Crista Elliot, MD;  Location: [facility];  Service:  Urology;  Laterality: N/A; 08/28/2012: LEFT HEART CATHETERIZATION WITH CORONARY ANGIOGRAM; N/A     Comment:  Procedure: LEFT HEART CATHETERIZATION WITH CORONARY               ANGIOGRAM;  Surgeon: Pamella Pert, MD;  Location: [facility]              CATH LAB;  Service: Cardiovascular;  Laterality: N/A; 09/23/2020: TEE WITHOUT CARDIOVERSION; N/A     Comment:  Procedure: TRANSESOPHAGEAL ECHOCARDIOGRAM (TEE);                Surgeon: Lamar Blinks, MD;  Location: [facility] OR[facility];                Service: Cardiovascular;  Laterality: N/A; 1988: WRIST SURGERY  BMI    Body Mass Index: 29.26 kg/m      Reproductive/Obstetrics negative OB ROS                             Anesthesia Physical Anesthesia Plan  ASA: III  Anesthesia Plan: General ETT and General   Post-op Pain Management:    Induction: Intravenous  PONV Risk Score and Plan: 3 and Ondansetron, Dexamethasone and Midazolam  Airway Management  Planned: Oral ETT  Additional Equipment:   Intra-op Plan:   Post-operative Plan: Extubation in OR  Informed Consent: I have reviewed the patients History and Physical, chart, labs and discussed the procedure including the risks, benefits and alternatives for the proposed anesthesia with the patient or authorized representative who has indicated his/her understanding and acceptance.     Dental Advisory Given and Consent reviewed with POA  Plan Discussed with: Anesthesiologist, CRNA and Surgeon  Anesthesia Plan Comments: (Patient's mother consented for risks of anesthesia including but not limited to:  - adverse reactions to medications - damage to eyes, teeth, lips or other oral mucosa - nerve damage due to positioning  - sore throat or hoarseness - Damage to heart, brain, nerves, lungs, other parts of body or loss of life  Patient's mother voiced understanding.)        Anesthesia Quick Evaluation

## 2022-09-20 NOTE — Progress Notes (Signed)
ANTICOAGULATION CONSULT NOTE  Pharmacy Consult for heparin Indication:  arterial occlusion of RLE, hx PAD  No Known Allergies  Patient Measurements: Height: 5\' 11"  (180.3 cm) Weight: 76.7 kg (169 lb 1.5 oz) IBW/kg (Calculated) : 75.3 Heparin Dosing Weight: 86.2 kg  Vital Signs: Temp: 98.8 F (37.1 C) (08/06 0449) Temp Source: Oral (08/06 0449) BP: 135/83 (08/06 0449) Pulse Rate: 102 (08/06 0449)  Labs: Recent Labs    09/18/22 0408 09/19/22 0442 09/19/22 1212 09/19/22 2154 09/20/22 0429  HGB 11.4* 12.3*  --   --  11.3*  HCT 32.0* 34.7*  --   --  32.3*  PLT 152 200  --   --  195  HEPARINUNFRC 0.32 0.21* 0.28* 0.36 0.27*  CREATININE 0.75 0.78  --   --  0.69    Estimated Creatinine Clearance: 128.1 mL/min (by C-G formula based on SCr of 0.69 mg/dL).   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   Chicken pox    Coronary artery disease    Inferior ST elevation myocardial infarction in July of 2014. Cardiac catheterization showed 95% distal RCA stenosis and 90% first diagonal stenosis with normal ejection fraction. He underwent PCI in bare-metal stent placement to the distal RCA.   Diabetes mellitus without complication (HCC)    Hypercholesterolemia    Stroke (HCC)    Tobacco use    Assessment: 42 y/o male presenting from outpatient wound care center due to pain and cyanotic vasculature in his right leg. Pharmacy has been consulted to dose heparin. 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   *Patient will need amputation per MD assessment*  Goal of Therapy:  Heparin level 0.3-0.7 units/ml Monitor platelets by anticoagulation protocol: Yes  8/2 @ 2245 HL < 0.1, Subtherapeutic (heparin rate 1550 un/hr) 8/3 @ 0725 HL 0.24 Subtherapeutic (heparin rate 1850 un/hr) 8/3 1500 HL  0.18 Subtherapeutic  2000>>2200u/hr 8/3 2203 HL 0.36        Therapeutic X 1  8/4 0408 HL 0.32        Therapeutic X 2  8/5 0441 HL 0.21        SUBtherapeutic  8/5 1212 HL 0.28 SUBtherapeutic 8/05 2154 HL 0.36 Therapeutic x 1 8/06 0429 HL 0.27 Subtherapeutic  Plan:  - Bolus 1300 units x 1 - Continue drip rate at 2650 units/hr. - Will recheck in 6 hrs after rate change - Check daily CBC and signs/symptoms of bleeding.  Otelia Sergeant, PharmD, MBA 09/20/2022 5:00 AM

## 2022-09-20 NOTE — Anesthesia Postprocedure Evaluation (Signed)
Anesthesia Post Note  Patient: Martin Mullen  Procedure(s) Performed: AMPUTATION ABOVE KNEE (Right: Knee)  Patient location during evaluation: PACU Anesthesia Type: General Level of consciousness: awake and alert Pain management: pain level controlled Vital Signs Assessment: post-procedure vital signs reviewed and stable Respiratory status: spontaneous breathing, nonlabored ventilation, respiratory function stable and patient connected to nasal cannula oxygen Cardiovascular status: blood pressure returned to baseline and stable Postop Assessment: no apparent nausea or vomiting Anesthetic complications: no  No notable events documented.   Last Vitals:  Vitals:   09/20/22 1500 09/20/22 1515  BP: 108/76 104/66  Pulse: 81 79  Resp: 14 (!) 8  Temp:  36.5 C  SpO2: 97% 95%    Last Pain:  Vitals:   09/20/22 1515  TempSrc:   PainSc: Asleep    LLE Motor Response: Purposeful movement (09/20/22 1515)            Stephanie Coup

## 2022-09-20 NOTE — Transfer of Care (Signed)
Immediate Anesthesia Transfer of Care Note  Patient: Martin Mullen  Procedure(s) Performed: AMPUTATION ABOVE KNEE (Right: Knee)  Patient Location: PACU  Anesthesia Type:General  Level of Consciousness: drowsy  Airway & Oxygen Therapy: Patient Spontanous Breathing and Patient connected to face mask oxygen  Post-op Assessment: Report given to RN and Post -op Vital signs reviewed and stable  Post vital signs: Reviewed  Last Vitals:  Vitals Value Taken Time  BP 94/72 09/20/22 1345  Temp 36.1 C 09/20/22 1343  Pulse 79 09/20/22 1347  Resp 10 09/20/22 1347  SpO2 100 % 09/20/22 1347  Vitals shown include unfiled device data.  Last Pain:  Vitals:   09/20/22 1107  TempSrc: Temporal  PainSc: 0-No pain      Patients Stated Pain Goal: 0 (09/18/22 1200)  Complications: No notable events documented.

## 2022-09-20 NOTE — Anesthesia Procedure Notes (Signed)
Procedure Name: Intubation Date/Time: 09/20/2022 12:02 PM  Performed by: Merlene Pulling, CRNAPre-anesthesia Checklist: Patient identified, Patient being monitored, Timeout performed, Emergency Drugs available and Suction available Patient Re-evaluated:Patient Re-evaluated prior to induction Oxygen Delivery Method: Circle system utilized Preoxygenation: Pre-oxygenation with 100% oxygen Induction Type: IV induction Ventilation: Mask ventilation without difficulty Laryngoscope Size: Mac and 3 Grade View: Grade I Tube type: Oral Tube size: 7.5 mm Number of attempts: 1 Airway Equipment and Method: Stylet Placement Confirmation: ETT inserted through vocal cords under direct vision, positive ETCO2 and breath sounds checked- equal and bilateral Secured at: 21 cm Tube secured with: Tape Dental Injury: Teeth and Oropharynx as per pre-operative assessment

## 2022-09-21 ENCOUNTER — Encounter: Payer: Self-pay | Admitting: Vascular Surgery

## 2022-09-21 DIAGNOSIS — Z89611 Acquired absence of right leg above knee: Secondary | ICD-10-CM

## 2022-09-21 DIAGNOSIS — I998 Other disorder of circulatory system: Secondary | ICD-10-CM | POA: Diagnosis not present

## 2022-09-21 DIAGNOSIS — I96 Gangrene, not elsewhere classified: Secondary | ICD-10-CM

## 2022-09-21 LAB — GLUCOSE, CAPILLARY
Glucose-Capillary: 170 mg/dL — ABNORMAL HIGH (ref 70–99)
Glucose-Capillary: 132 mg/dL — ABNORMAL HIGH (ref 70–99)
Glucose-Capillary: 220 mg/dL — ABNORMAL HIGH (ref 70–99)
Glucose-Capillary: 128 mg/dL — ABNORMAL HIGH (ref 70–99)

## 2022-09-21 MED ORDER — APIXABAN 5 MG PO TABS
5.0000 mg | ORAL_TABLET | Freq: Two times a day (BID) | ORAL | Status: DC
  Administered 2022-09-21 – 2022-09-28 (×14): 5 mg via ORAL
  Filled 2022-09-21 (×14): qty 1

## 2022-09-21 NOTE — Evaluation (Signed)
Physical Therapy Evaluation Patient Details Name: Martin Mullen MRN: [id number] DOB: [birth year] Today's Date: 09/21/2022  History of Present Illness  Martin Mullen is a 41 y.o. 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 RN reports he received dilaudid prior to session. Patient's uncle in room who works at [employer]. Patient is mod I for rolling with use of bed rail and increased time. He requires mod A for sidelying to sit to raise trunk to full sitting at edge of bed and requires max A for scooting forward in bed. Patient was able to squat pivot to recliner from bed with +2 mod A. He will continue to benefit from skilled PT to improve functional independence and safety with mobility.            If plan is discharge home, recommend the following: A lot of help with walking and/or transfers;A lot of help with bathing/dressing/bathroom;Help with stairs or ramp for entrance;Assist for transportation;Assistance with cooking/housework   Can travel by private vehicle        Equipment Recommendations Other (comment) (TBD)  Recommendations for Other Services  Rehab consult    Functional Status Assessment Patient has had a recent decline in their functional status and demonstrates the ability to make significant improvements in function in a reasonable and predictable amount of time.     Precautions / Restrictions Precautions Precautions: Fall Restrictions Weight Bearing Restrictions: Yes RLE  Weight Bearing: Non weight bearing      Mobility  Bed Mobility Overal bed mobility: Needs Assistance Bed Mobility: Rolling, Supine to Sit, Sidelying to Sit Rolling: Supervision, Used rails Sidelying to sit: Mod assist, HOB elevated, Used rails Supine to sit: Mod assist, HOB elevated, Used rails     General bed mobility comments: increased time    Transfers Overall transfer level: Needs assistance Equipment used: None, 2 person hand held assist Transfers: Bed to chair/wheelchair/BSC       Squat pivot transfers: Mod assist, +2 physical assistance, From elevated surface          Ambulation/Gait               General Gait Details: unable at this time  Stairs            Wheelchair Mobility     Tilt Bed    Modified Rankin (Stroke Patients Only)       Balance Overall balance assessment: Needs assistance Sitting-balance support: Feet supported Sitting balance-Leahy Scale: Fair Sitting balance - Comments: cues needed to sit upright due to lethargy   Standing balance support: Bilateral upper extremity supported, During functional activity Standing balance-Leahy Scale: Fair                               Pertinent Vitals/Pain Pain Assessment Pain Assessment: Faces Faces Pain Scale: Hurts little more Pain Location: R LE residual limb Pain Descriptors / Indicators: Discomfort, Sore Pain Intervention(s): Monitored during session, Repositioned    Home Living Family/patient expects to be discharged to:: Private residence  Living Arrangements: Other (Comment) (mother's ex boyfriend) Available Help at Discharge: Family Type of Home: Mobile home Home Access: Stairs to enter Entrance Stairs-Rails: Right;Left;Can reach both Entrance Stairs-Number of Steps: 5   Home Layout: One level Home Equipment: Agricultural consultant (2 wheels);Cane - single point Additional Comments: patient's uncle in room and reports falls prior to admission due to poor  balance.    Prior Function Prior Level of Function : Independent/Modified Independent;Driving             Mobility Comments: no AD use; last few days with RW due to foot pain ADLs Comments: indep with ADL     Extremity/Trunk Assessment   Upper Extremity Assessment Upper Extremity Assessment: Defer to OT evaluation    Lower Extremity Assessment Lower Extremity Assessment: RLE deficits/detail RLE: Unable to fully assess due to pain RLE Sensation: history of peripheral neuropathy RLE Coordination: decreased gross motor    Cervical / Trunk Assessment Cervical / Trunk Assessment: Normal  Communication   Communication Communication: No apparent difficulties Cueing Techniques: Verbal cues  Cognition Arousal: Lethargic, Suspect due to medications Behavior During Therapy: WFL for tasks assessed/performed, Flat affect Overall Cognitive Status: Within Functional Limits for tasks assessed                                          General Comments      Exercises     Assessment/Plan    PT Assessment Patient needs continued PT services  PT Problem List Decreased strength;Decreased activity tolerance;Decreased balance;Decreased mobility;Decreased skin integrity;Decreased knowledge of use of DME;Decreased knowledge of precautions;Decreased cognition;Pain       PT Treatment Interventions DME instruction;Gait training;Balance training;Functional mobility training;Therapeutic activities;Therapeutic exercise;Neuromuscular re-education;Cognitive remediation;Patient/family education;Wheelchair mobility training    PT Goals (Current goals can be found in the Care Plan section)  Acute Rehab PT Goals Patient Stated Goal: to improve mobility PT Goal Formulation: With patient/family Time For Goal Achievement: 10/05/22 Potential to Achieve Goals: Good    Frequency 7X/week     Co-evaluation PT/OT/SLP Co-Evaluation/Treatment: Yes             AM-PAC PT "6  Clicks" Mobility  Outcome Measure Help needed turning from your back to your side while in a flat bed without using bedrails?: A Lot Help needed moving from lying on your back to sitting on the side of a flat bed without using bedrails?: A Lot Help needed moving to and from a bed to a chair (including a wheelchair)?: A Lot Help needed standing up from a chair using your arms (e.g., wheelchair or bedside chair)?: A Lot Help needed to walk in hospital room?: Total Help needed climbing 3-5 steps with a railing? : Total 6 Click Score: 10    End of Session   Activity Tolerance: Patient tolerated treatment well;Patient limited by lethargy Patient left: in chair;with call bell/phone within reach;with chair alarm set Nurse Communication: Mobility status PT Visit Diagnosis: Other abnormalities of gait and mobility (R26.89);Muscle weakness (generalized) (M62.81);Pain;History of falling (Z91.81) Pain - Right/Left: Right Pain - part of body: Leg    Time: 1128-1201 PT Time Calculation (min) (ACUTE ONLY): 33 min   Charges:   PT Evaluation $PT Eval Moderate Complexity: 1 Mod PT Treatments $Therapeutic Activity: 8-22 mins PT General Charges $$ ACUTE PT VISIT: 1 Visit          , PT, GCS 09/21/22,1:35 PM

## 2022-09-21 NOTE — Progress Notes (Signed)
IV occluded. Consult for VAT for difficult IV stick.  Cornell Barman 

## 2022-09-21 NOTE — Progress Notes (Signed)
PROGRESS NOTE    Martin Mullen   GMW:[id number] DOB: [birth year]  DOA: 09/12/2022 Date of Service: 09/21/22 PCP: [name], NP     Brief Narrative / Hospital Course:  Martin Mullen is a 42 y.o. 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 He noted that he had not been taking his Eliquis for approximately 3 months. When asked by admitting hospitalist him why he discontinued, he stated he no longer wanted to take it but did not have any particular reason for that.  07/29: to ED, Vascular surgery was consulted and patient started on IV heparin. [facility] contacted for admission.  07/30: S/p angio and stenting on the right foot.  Status post thrombectomy.  Had a abrupt change in his pulse exam last night suggestive of foot ischemia.  Likely failure of Aggrastat per vascular 07/31: Overnight he had absent pulses from the knee down on Doppler.  Foot was becoming cyanotic and cold. Plan for repeat angio and thrombolysis with continuous tPA infusion today 08/01: Angio and stenting of the right LE, mechanical thrombectomy 08/02: Likely AKA early next week once family decides over the weekend 08/03: Being treated for suspected sepsis with IV antibiotics.  Increased oxycodone dose for better pain control 08/04: Patient mentally clear and agreeable for AKA 08/05: Plan for AKA on 8/6 per vascular surgery 08/06: Right above-the-knee amputation  08/07: PT/OT to see   Consultants:  Vascular Surgery  Infectious disease  Hematology/Oncology  Procedures: 07/30: Angio and stenting of the right LE, mechanical  thrombectomy 08/01: Repeat Angio and stenting of the right LE, mechanical thrombectomy 08/06: Right above-the-knee amputation       ASSESSMENT & PLAN:   Principal Problem:   Acute lower limb ischemia Active Problems:   Diabetic foot ulcer associated with type 2 diabetes mellitus (HCC)   Recurrent strokes (HCC)   Uncontrolled type 2 diabetes mellitus with hyperglycemia, with long-term current use of insulin (HCC)   HTN (hypertension)   CAD S/P percutaneous coronary angioplasty   Arterial occlusion   Gangrene (HCC)  Acute right lower limb ischemia:  Likely secondary to noncompliance w/ eliquis.  Restart eliquis today  Oxycodone, morphine prn for pain.  Continue w/ neurovascular checks  POD1 s/p R AKA   Sepsis likely due to diabetic foot ulcer/infection:  Fever, tachycardia, hypotension on 8/2 likely due to diabetic foot ulcer/gangrene Diabetic foot ulcer involving the second right toe that has been since amputated. Remains an ulcer at his amputation site, wound culture growing gram-negative rods, gram-positive cocci in pairs continue IV vancomycin, Flagyl, cefepime for now, likely transition to oral antibiotic in next 1 to 2 days.   Removed femoral triple-lumen and Foley on 8/3.  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 Has been on chronic Eliquis, but self discontinued 3 months ago.  Holding eliquis. Continue on IV heparin.   Seen by oncology.  Recommends discharge on Eliquis and antiplatelet agents per oncology   DM2 poorly controlled, HbA1c 7.7.  Continue on SSI w/ accuchecks    Hx of CAD:  s/p percutaneous coronary angioplasty.  Has not been taking eliquis at home.  Holding  eliquis. Continue on IV heparin for now   HTN:  As needed hydralazine and labetalol   Goals of care conversation  Palliative care seen.   Full code     DVT prophylaxis: eliquis  Pertinent IV fluids/nutrition: no continuous IV fluids,  carb diet  Central lines / invasive devices: none  Code Status: FULL CODE  ACP documentation reviewed: 09/21/22 advanced directive on file, [name] is HCPOA  Current Admission Status: inpatient   TOC needs / Dispo plan: expect will need SNF rehab  Barriers to discharge / significant pending items: PT/OT to see, likely needing placement              Subjective / Brief ROS:  Patient reports no concerns today  Denies CP/SOB.  Pain controlled.  Denies new weakness.  Tolerating diet.  Reports no concerns w/ urination/defecation.   Family Communication: none at this time     Objective Findings:  Vitals:   09/20/22 1633 09/20/22 1915 09/21/22 0407 09/21/22 0833  BP: 107/66 108/76 (!) 147/83 (!) 128/91  Pulse: 85 86 89 85  Resp: 12   18  Temp: 97.6 F (36.4 C) 97.6 F (36.4 C) 97.8 F (36.6 C) 98.3 F (36.8 C)  TempSrc: Oral  Oral   SpO2: 96% 97% 100% 98%  Weight:      Height:        Intake/Output Summary (Last 24 hours) at 09/21/2022 1526 Last data filed at 09/21/2022 0015 Gross per 24 hour  Intake 380 ml  Output 1200 ml  Net -820 ml   Filed Weights   09/14/22 1324 09/18/22 0500 09/20/22 1107  Weight: 86.2 kg 76.7 kg 76.7 kg    Examination:  Physical Exam Constitutional:      General: He is not in acute distress. Cardiovascular:     Rate and Rhythm: Normal rate and regular rhythm.  Pulmonary:     Effort: Pulmonary effort is normal.     Breath sounds: Normal breath sounds.  Abdominal:     General: Abdomen is flat.     Palpations: Abdomen is soft.  Musculoskeletal:     Left lower leg: No edema.  Skin:    General: Skin is warm and dry.  Neurological:     Mental Status: He is alert. Mental status is at baseline.  Psychiatric:        Mood and Affect: Mood normal.        Behavior: Behavior normal.          Scheduled Medications:   apixaban  5 mg Oral BID   aspirin EC  81 mg Oral Daily   insulin aspart  0-15 Units Subcutaneous TID  WC   nicotine  21 mg Transdermal Daily   polyethylene glycol  17 g Oral Daily   senna-docusate  2 tablet Oral BID   sodium chloride flush  3 mL Intravenous Q12H    Continuous Infusions:  sodium chloride 250 mL (09/17/22 1548)   sodium chloride 75 mL/hr at 09/21/22 1216   ceFEPime (MAXIPIME) IV 2 g (09/21/22 1440)   metronidazole 500 mg (09/21/22 0722)   vancomycin 1,500 mg (09/21/22 1610)    PRN Medications:  sodium chloride, acetaminophen, hydrALAZINE, HYDROmorphone (DILAUDID) injection, labetalol, midazolam, ondansetron (ZOFRAN) IV, oxyCODONE, sodium chloride flush  Antimicrobials from admission:  Anti-infectives (From admission, onward)    Start     Dose/Rate Route Frequency Ordered Stop   09/17/22 0100  vancomycin (VANCOREADY) IVPB 1500 mg/300 mL  1,500 mg 150 mL/hr over 120 Minutes Intravenous Every 12 hours 09/16/22 1735     09/17/22 0000  vancomycin (VANCOREADY) IVPB 1750 mg/350 mL  Status:  Discontinued        1,750 mg 175 mL/hr over 120 Minutes Intravenous Every 12 hours 09/16/22 1643 09/16/22 1735   09/16/22 1700  ceFEPIme (MAXIPIME) 2 g in sodium chloride 0.9 % 100 mL IVPB  Status:  Discontinued        2 g 200 mL/hr over 30 Minutes Intravenous  Once 09/16/22 1602 09/16/22 1608   09/16/22 1700  metroNIDAZOLE (FLAGYL) IVPB 500 mg        500 mg 100 mL/hr over 60 Minutes Intravenous Every 12 hours 09/16/22 1602 09/23/22 1859   09/16/22 1700  ceFEPIme (MAXIPIME) 2 g in sodium chloride 0.9 % 100 mL IVPB  Status:  Discontinued        2 g 200 mL/hr over 30 Minutes Intravenous Every 8 hours 09/16/22 1608 09/16/22 1616   09/16/22 1615  ceFEPIme (MAXIPIME) 2 g in sodium chloride 0.9 % 100 mL IVPB        2 g 200 mL/hr over 30 Minutes Intravenous Every 8 hours 09/16/22 1616     09/16/22 1430  vancomycin (VANCOCIN) IVPB 1000 mg/200 mL premix  Status:  Discontinued        1,000 mg 200 mL/hr over 60 Minutes Intravenous Every 24 hours 09/16/22 1333 09/16/22 1643   09/15/22  1030  ceFAZolin (ANCEF) IVPB 2g/100 mL premix        2 g 200 mL/hr over 30 Minutes Intravenous On call to O.R. 09/15/22 1028 09/15/22 1115   09/14/22 1012  ceFAZolin (ANCEF) IVPB 2g/100 mL premix        2 g 200 mL/hr over 30 Minutes Intravenous 30 min pre-op 09/14/22 1012 09/14/22 1053   09/13/22 1347  ceFAZolin (ANCEF) IVPB 2g/100 mL premix        2 g 200 mL/hr over 30 Minutes Intravenous 30 min pre-op 09/13/22 1347 09/13/22 1450           Data Reviewed:  I have personally reviewed the following...  CBC: Recent Labs  Lab 09/16/22 1618 09/17/22 0355 09/18/22 0408 09/19/22 0442 09/20/22 0429 09/21/22 0503  WBC 10.2 8.8 8.0 11.7* 13.9* 10.7*  NEUTROABS 7.5  --   --   --   --   --   HGB 12.1* 11.2* 11.4* 12.3* 11.3* 10.9*  HCT 34.4* 32.3* 32.0* 34.7* 32.3* 32.5*  MCV 88.7 89.7 87.0 85.9 88.0 91.3  PLT 162 143* 152 200 195 252   Basic Metabolic Panel: Recent Labs  Lab 09/16/22 1618 09/17/22 0355 09/18/22 0408 09/19/22 0442 09/20/22 0429 09/21/22 0503  NA 129*  --  131* 131* 132* 136  K 3.8  --  3.5 3.8 3.9 3.7  CL 98  --  99 97* 100 106  CO2 22  --  24 24 24 24   GLUCOSE 152*  --  171* 248* 235* 176*  BUN 9  --  7 7 7 8   CREATININE 0.89 0.75 0.75 0.78 0.69 0.67  CALCIUM 7.8*  --  7.8* 8.4* 8.1* 8.2*   GFR: Estimated Creatinine Clearance: 128.1 mL/min (by C-G formula based on SCr of 0.67 mg/dL). Liver Function Tests: Recent Labs  Lab 09/16/22 1618  AST 17  ALT 8  ALKPHOS 84  BILITOT 0.5  PROT 6.0*  ALBUMIN 2.5*   No results for input(s): "LIPASE", "AMYLASE" in the last 168 hours. No results  for input(s): "AMMONIA" in the last 168 hours. Coagulation Profile: Recent Labs  Lab 09/16/22 1618  INR 1.1   Cardiac Enzymes: No results for input(s): "CKTOTAL", "CKMB", "CKMBINDEX", "TROPONINI" in the last 168 hours. BNP (last 3 results) No results for input(s): "PROBNP" in the last 8760 hours. HbA1C: No results for input(s): "HGBA1C" in the last 72  hours. CBG: Recent Labs  Lab 09/20/22 1354 09/20/22 1630 09/20/22 2116 09/21/22 0832 09/21/22 1142  GLUCAP 166* 154* 220* 170* 132*   Lipid Profile: No results for input(s): "CHOL", "HDL", "LDLCALC", "TRIG", "CHOLHDL", "LDLDIRECT" in the last 72 hours. Thyroid Function Tests: No results for input(s): "TSH", "T4TOTAL", "FREET4", "T3FREE", "THYROIDAB" in the last 72 hours. Anemia Panel: No results for input(s): "VITAMINB12", "FOLATE", "FERRITIN", "TIBC", "IRON", "RETICCTPCT" in the last 72 hours. Most Recent Urinalysis On File:     Component Value Date/Time   COLORURINE YELLOW (A) 09/16/2022 1654   APPEARANCEUR HAZY (A) 09/16/2022 1654   LABSPEC 1.017 09/16/2022 1654   PHURINE 5.0 09/16/2022 1654   GLUCOSEU NEGATIVE 09/16/2022 1654   HGBUR MODERATE (A) 09/16/2022 1654   BILIRUBINUR NEGATIVE 09/16/2022 1654   KETONESUR 20 (A) 09/16/2022 1654   PROTEINUR 30 (A) 09/16/2022 1654   NITRITE NEGATIVE 09/16/2022 1654   LEUKOCYTESUR NEGATIVE 09/16/2022 1654   Sepsis Labs: @LABRCNTIP (procalcitonin:4,lacticidven:4) Microbiology: Recent Results (from the past 240 hour(s))  MRSA Next Gen by PCR, Nasal     Status: None   Collection Time: 09/13/22 12:14 PM   Specimen: Nasal Mucosa; Nasal Swab  Result Value Ref Range Status   MRSA by PCR Next Gen NOT DETECTED NOT DETECTED Final    Comment: (NOTE) The GeneXpert MRSA Assay (FDA approved for NASAL specimens only), is one component of a comprehensive MRSA colonization surveillance program. It is not intended to diagnose MRSA infection nor to guide or monitor treatment for MRSA infections. Test performance is not FDA approved in patients less than 61 years old. Performed at [facility], [address] [place], [place] [address]   Culture, blood (Routine X 2) w Reflex to ID Panel     Status: None   Collection Time: 09/16/22  4:19 PM   Specimen: BLOOD  Result Value Ref Range Status   Specimen Description BLOOD RIGHT [facility]  Final    Special Requests   Final    BOTTLES DRAWN AEROBIC AND ANAEROBIC Blood Culture adequate volume   Culture   Final    NO GROWTH 5 DAYS Performed at [facility], [address] [place], [place] [address]    Report Status 09/21/2022 FINAL  Final  Culture, blood (Routine X 2) w Reflex to ID Panel     Status: None   Collection Time: 09/16/22  4:24 PM   Specimen: BLOOD RIGHT HAND  Result Value Ref Range Status   Specimen Description BLOOD RIGHT HAND  Final   Special Requests   Final    BOTTLES DRAWN AEROBIC AND ANAEROBIC Blood Culture adequate volume   Culture   Final    NO GROWTH 5 DAYS Performed at [facility], [address] [place], [place] [address]    Report Status 09/21/2022 FINAL  Final  Aerobic Culture w Gram Stain (superficial specimen)     Status: Abnormal   Collection Time: 09/16/22  7:35 PM   Specimen: Wound  Result Value Ref Range Status   Specimen Description   Final    WOUND Performed at [facility], [address], [place], [place] [address]  Special Requests   Final    RIGHT FOREARM Performed at [facility], [address] [place], [place] [address]    Gram Stain   Final    RARE SQUAMOUS EPITHELIAL CELLS PRESENT ABUNDANT GRAM NEGATIVE RODS FEW GRAM POSITIVE COCCI IN PAIRS Performed at [facility], [address], [place], [place] [address]    Culture MULTIPLE ORGANISMS PRESENT, NONE PREDOMINANT (A)  Final   Report Status 09/19/2022 FINAL  Final      Radiology Studies last 3 days: No results found.           LOS: 9 days      Sunnie Nielsen, DO [facility] 09/21/2022, 3:26 PM    Dictation software may have been used to generate the above note. Typos may occur and escape review in typed/dictated notes. Please contact Dr Lyn Hollingshead directly for clarity if needed.  Staff may message me via secure chat in Epic  but this may not receive an immediate response,  please page me for  urgent matters!  If 7PM-7AM, please contact night coverage [email or web address]

## 2022-09-21 NOTE — Progress Notes (Signed)
                                                     Palliative Care Progress Note   Patient Name: Martin Mullen       Date: 09/21/2022 DOB: 1980/06/23  Age: 42 y.o. MRN#: [id number] Attending Physician: Sunnie Nielsen, DO Primary Care Physician: [name], NP Admit Date: 09/12/2022  Chart reviewed. No acute palliative needs identified.   PMT will continue to follow and shadow the patient's chart.   Please re-engage with PMT if goals change, at patient/family's request, or if patient's health deteriorates during hospitalization.   Thank you for allowing the Palliative Medicine Team to assist in the care of Carolyn L Joelene Millin.  Samara Deist L. Manon Hilding, FNP-BC Palliative Medicine Team Team Phone # [phone]  No charge

## 2022-09-21 NOTE — TOC Progression Note (Signed)
Transition of Care [facility]) - Progression Note    Patient Details  Name: Martin Mullen MRN: [id number] Date of Birth: April 28, 1980  [facility] CM/SW Contact  Chapman Fitch, RN Phone Number: 09/21/2022, 11:03 AM  Clinical Narrative:     POD1 AKA PT eval pending        Expected Discharge Plan and Services                                               Social Determinants of Health (SDOH) Interventions SDOH Screenings   Food Insecurity: No Food Insecurity (06/29/2022)  Housing: Low Risk  (06/29/2022)  Transportation Needs: No Transportation Needs (06/29/2022)  Utilities: Not At Risk (06/29/2022)  Alcohol Screen: Low Risk  (07/01/2020)  Depression (PHQ2-9): High Risk (05/10/2022)  Financial Resource Strain: Low Risk  (06/10/2022)  Tobacco Use: High Risk (09/20/2022)    Readmission Risk Interventions     No data to display

## 2022-09-21 NOTE — Progress Notes (Signed)
Inpatient Rehab Admissions Coordinator Note:   Per therapy recommendations patient was screened for CIR candidacy by Stephania Fragmin, PT. At this time, pt appears to be a potential candidate for CIR. I will place an order for rehab consult for full assessment, per our protocol.  Please contact me any with questions.Estill Dooms, PT, DPT [phone] 09/21/22 4:33 PM

## 2022-09-21 NOTE — Evaluation (Signed)
Occupational Therapy Evaluation Patient Details Name: Martin Mullen MRN: [id number] DOB: [birth year] Today's Date: 09/21/2022   History of Present Illness Martin Mullen is a 42 y.o. 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 Prior to hospital admission, pt was IND. Pt presents to acute OT demonstrating impaired ADL performance and functional mobility 2/2 decreased activity tolerance and functional strength/balance deficits. Pt currently requires MAX A don L sock seated EOB, poor dynamic sitting balance. MOD A x2 + HHA for squat pivot t/f. MOD A sit<>stand at chair. SETUP for meal - pt initially using non-dominant L hand to eat, improved to IND with use of dominant R hand after cueing. Limited session with lunch tray arrival as pt did not eat breakfast and eager to eat. Pt would benefit from skilled OT to address noted impairments and functional limitations (see below for any additional details). Upon hospital discharge, recommend >3 hours/day follow up therapy.    If plan is discharge home, recommend the following: Two people to help with walking and/or transfers;Two people to help with bathing/dressing/bathroom    Functional Status Assessment  Patient has had a recent decline in their functional status and demonstrates the ability to make significant improvements in function in a reasonable and predictable amount of time.  Equipment Recommendations  Other (comment) (defer)    Recommendations for Other Services        Precautions / Restrictions Precautions Precautions: Fall Restrictions Weight Bearing Restrictions: Yes RLE Weight Bearing: Non weight bearing      Mobility Bed Mobility               General bed mobility comments: seated EOB upon arrival    Transfers Overall transfer level: Needs assistance Equipment used: 2 person hand held assist Transfers: Bed to chair/wheelchair/BSC, Sit to/from Stand Sit to Stand: Mod assist   Squat pivot transfers: Mod assist, +2 physical assistance, From elevated surface       General transfer comment: clears rear standing from chair height, assist for balance and upright posture      Balance Overall balance assessment: Needs assistance Sitting-balance support: Feet supported Sitting balance-Leahy Scale: Fair     Standing balance support: Bilateral upper extremity supported Standing balance-Leahy Scale: Poor                             ADL either performed or assessed with clinical judgement   ADL Overall ADL's : Needs assistance/impaired                                       General ADL Comments: MAX A don L sock seated EOB, poor dynamic sitting balance. MOD A x2 + HHA for simulated BSC t/f. SETUP for meal - pt initially using non-dominant L hand to eat, improved to IND with use of dominant R hand after cueing      Pertinent Vitals/Pain Pain Assessment Pain Assessment: Faces Faces Pain Scale: Hurts  little more Pain Location: R LE residual limb Pain Descriptors / Indicators: Discomfort, Sore Pain Intervention(s): Limited activity within patient's tolerance, Repositioned, Premedicated before session     Extremity/Trunk Assessment Upper Extremity Assessment Upper Extremity Assessment: Overall WFL for tasks assessed   Lower Extremity Assessment Lower Extremity Assessment: Defer to PT evaluation RLE: Unable to fully assess due to pain RLE Sensation: history of peripheral neuropathy RLE  Coordination: decreased gross motor   Cervical / Trunk Assessment Cervical / Trunk Assessment: Normal   Communication Communication Communication: No apparent difficulties Cueing Techniques: Verbal cues   Cognition Arousal: Lethargic, Suspect due to medications Behavior During Therapy: WFL for tasks assessed/performed Overall Cognitive Status: Within Functional Limits for tasks assessed                                        Home Living Family/patient expects to be discharged to:: Private residence Living Arrangements: Other (Comment) (mothers ex boyfriend) Available Help at Discharge: Family Type of Home: Mobile home Home Access: Stairs to enter Entrance Stairs-Number of Steps: 5 Entrance Stairs-Rails: Right;Left;Can reach both Home Layout: One level     Bathroom Shower/Tub: Chief Strategy Officer: Standard     Home Equipment: Agricultural consultant (2 wheels);Cane - single point   Additional Comments: patient's uncle in room and reports falls prior to admission due to poor balance.      Prior Functioning/Environment Prior Level of Function : Independent/Modified Independent;Driving             Mobility Comments: no AD use; last few days with RW due to foot pain ADLs Comments: indep with ADL        OT Problem List: Decreased strength;Decreased activity tolerance;Impaired balance (sitting and/or standing);Decreased safety awareness;Decreased range of motion      OT Treatment/Interventions: Therapeutic exercise;Self-care/ADL training;DME and/or AE instruction;Energy conservation;Therapeutic activities;Balance training    OT Goals(Current goals can be found in the care plan section) Acute Rehab OT Goals Patient Stated Goal: to walk OT Goal Formulation: With patient Time For Goal Achievement: 10/05/22 Potential to Achieve Goals: Good ADL Goals Pt Will Perform Grooming: with set-up;with supervision;standing Pt Will Perform Lower Body  Dressing: sit to/from stand;with supervision Pt Will Transfer to Toilet: with modified independence;ambulating;bedside commode  OT Frequency: Min 1X/week    Co-evaluation              AM-PAC OT "6 Clicks" Daily Activity     Outcome Measure Help from another person eating meals?: None Help from another person taking care of personal grooming?: A Little Help from another person toileting, which includes using toliet, bedpan, or urinal?: A Lot Help from another person bathing (including washing, rinsing, drying)?: A Lot Help from another person to put on and taking off regular upper body clothing?: A Little Help from another person to put on and taking off regular lower body clothing?: A Lot 6 Click Score: 16   End of Session Equipment Utilized During Treatment: Rolling walker (2 wheels) Nurse Communication: Mobility status  Activity Tolerance: Patient tolerated treatment well Patient left: in chair;with call bell/phone within reach;with chair alarm set  OT Visit Diagnosis: Other abnormalities of gait and mobility (R26.89);Muscle weakness (generalized) (M62.81)                Time: 1610-9604 OT Time Calculation (min): 10 min Charges:  OT General Charges $OT Visit: 1 Visit OT Evaluation $OT Eval  Low Complexity: 1 Low  Kathie Dike, M.S. OTR/L  09/21/22, 1:46 PM  ascom [phone]

## 2022-09-21 NOTE — Hospital Course (Addendum)
Martin Mullen is a 42 y.o. 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 He noted that he had not been taking his Eliquis for approximately 3 months. When asked by admitting hospitalist him why he discontinued, he stated he no longer wanted to take it but did not have any particular reason for that.  07/29: to ED, Vascular surgery was consulted and patient started on IV heparin. [facility] contacted for admission.  07/30: S/p angio and stenting on the right foot.  Status post thrombectomy.  Had a abrupt change in his pulse exam last night suggestive of foot ischemia.  Likely failure of Aggrastat per vascular 07/31: Overnight he had absent pulses from the knee down on Doppler.  Foot was becoming cyanotic and cold. Plan for repeat angio and thrombolysis with continuous tPA infusion today 08/01: Angio and stenting of the right LE, mechanical thrombectomy 08/02: Likely AKA early next week once family decides over the weekend 08/03: Being treated for suspected sepsis with IV antibiotics.  Increased oxycodone dose for better pain control 08/04: Patient mentally clear and agreeable for AKA 08/05: Plan for AKA on 8/6 per vascular surgery 08/06: Right above-the-knee amputation  08/07-08/08: stable, pending evaluation for CIR 08/09: CIR will not take him d/t social/living situation, TOC working on SNF   Consultants:  Vascular Surgery  Infectious disease  Hematology/Oncology  Procedures: 07/30: Angio and stenting of the right LE, mechanical thrombectomy 08/01: Repeat Angio and stenting of the right LE, mechanical  thrombectomy 08/06: Right above-the-knee amputation       ASSESSMENT & PLAN:   Principal Problem:   Acute lower limb ischemia Active Problems:   Diabetic foot ulcer associated with type 2 diabetes mellitus (HCC)   Recurrent strokes (HCC)   Uncontrolled type 2 diabetes mellitus with hyperglycemia, with long-term current use of insulin (HCC)   HTN (hypertension)   CAD S/P percutaneous coronary angioplasty   Arterial occlusion   Gangrene (HCC)  Acute right lower limb ischemia:  Likely secondary to noncompliance w/ eliquis.  Eliquis  Oxycodone, morphine prn for pain.  Continue w/ neurovascular checks  s/p R AKA, vascular following    Sepsis likely due to diabetic foot ulcer/infection:  Fever, tachycardia, hypotension on 8/2 likely due to diabetic foot ulcer/gangrene Diabetic foot ulcer involving the second right toe that has been since amputated. 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 Has been on chronic Eliquis, but self discontinued 3 months ago.  Continue Eliquis  Seen by oncology.  Recommends discharge on Eliquis and antiplatelet    DM2 poorly controlled, HbA1c 7.7.  Continue on SSI w/ accuchecks    Hx of CAD:  s/p percutaneous coronary angioplasty.  Has not been taking eliquis at home.  Off heparin, back to Eliquis Starting ARB, may add BB if BP tolerates    HTN:  Starting ARB, may add BB if BP tolerates    Goals of care conversation  Palliative care seen.   Full code     DVT prophylaxis: eliquis  Pertinent IV fluids/nutrition: no continuous IV fluids, carb diet  Central lines / invasive devices: none  Code Status: FULL CODE  ACP documentation reviewed: 09/21/22 advanced directive on file, Martin Mullen is HCPOA  Current Admission Status:  inpatient   TOC needs / Dispo plan: expect will need CIR or SNF rehab  Barriers to discharge / significant pending items: pending rehab assessment w/ CIR

## 2022-09-21 NOTE — Plan of Care (Signed)
  Problem: Coping: Goal: Ability to adjust to condition or change in health will improve Outcome: Progressing   Problem: Metabolic: Goal: Ability to maintain appropriate glucose levels will improve Outcome: Progressing   Problem: Tissue Perfusion: Goal: Adequacy of tissue perfusion will improve Outcome: Progressing   Problem: Skin Integrity: Goal: Risk for impaired skin integrity will decrease Outcome: Progressing   Problem: Education: Goal: Understanding of CV disease, CV risk reduction, and recovery process will improve Outcome: Progressing

## 2022-09-21 NOTE — Progress Notes (Signed)
Progress Note    09/21/2022 10:12 AM 1 Day Post-Op  Subjective:   Martin Mullen is a 42 y.o. 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 He was resting comfortably in bed this morning. No complaints overnight. No noted drainage or bleeding at the operative site. Dressing remains dry and intact. Vitals all remain stable.    Vitals:   09/21/22 0407 09/21/22 0833  BP: (!) 147/83 (!) 128/91  Pulse: 89 85  Resp:  18  Temp: 97.8 F (36.6 C) 98.3 F (36.8 C)  SpO2: 100% 98%   Physical Exam: Cardiac:  RRR, with tachycardic Rate, normal S1, S2.  No murmurs appreciated Lungs: Clear throughout on auscultation, no rales, rhonchi or wheezing noted. Incisions: Left groin incision with erythema.  Dressing applied, clean dry and intact.  No hematoma noted Extremities: Lower extremity is warm to touch with palpable pulses.  Right lower extremity below the knee cool to touch with mottled purple foot and toes.  Without Doppler pulses. Abdomen: Positive bowel sounds throughout, soft, nontender and nondistended. Neurologic: Alert and oriented to 2,name and place,  history of CVA x 3.  Patient unable to follow commands appropriately all the time  CBC    Component Value Date/Time   WBC 10.7 (H) 09/21/2022 0503   RBC 3.56 (L) 09/21/2022 0503   HGB 10.9 (L) 09/21/2022 0503   HGB 17.9 (H) 08/11/2021 0937   HCT 32.5 (L) 09/21/2022 0503   HCT 51.0 08/11/2021 0937   PLT 252 09/21/2022 0503   PLT 261 08/11/2021 0937   MCV 91.3 09/21/2022 0503   MCV 93 08/11/2021 0937   MCV 93 01/29/2013 0040   MCH 30.6 09/21/2022 0503   MCHC 33.5 09/21/2022 0503   RDW 13.6 09/21/2022 0503   RDW 12.4 08/11/2021 0937   RDW 13.8  01/29/2013 0040   LYMPHSABS 1.8 09/16/2022 1618   LYMPHSABS 4.8 (H) 08/11/2021 0937   MONOABS 0.7 09/16/2022 1618   EOSABS 0.1 09/16/2022 1618   EOSABS 0.5 (H) 08/11/2021 0937   BASOSABS 0.1 09/16/2022 1618   BASOSABS 0.1 08/11/2021 0937    BMET    Component Value Date/Time   NA 136 09/21/2022 0503   NA 140 08/11/2021 0937   NA 139 01/29/2013 0040   K 3.7 09/21/2022 0503   K 4.0 01/29/2013 0040   CL 106 09/21/2022 0503   CL 107 01/29/2013 0040   CO2 24 09/21/2022 0503   CO2 28 01/29/2013 0040   GLUCOSE 176 (H) 09/21/2022 0503   GLUCOSE 228 (H) 01/29/2013 0040   BUN 8 09/21/2022 0503   BUN 11 08/11/2021 0937   BUN 15 01/29/2013 0040   CREATININE 0.67 09/21/2022 0503   CREATININE 0.82 08/11/2022 1455   CALCIUM 8.2 (L) 09/21/2022 0503   CALCIUM 9.2 01/29/2013 0040   GFRNONAA >60 09/21/2022 0503   GFRNONAA >60 01/29/2013 0040   GFRAA 104 11/13/2019 1550   GFRAA >60 01/29/2013 0040    INR    Component Value Date/Time   INR 1.1 09/16/2022 1618     Intake/Output Summary (Last 24 hours) at 09/21/2022 1012 Last data filed at 09/21/2022 0015 Gross per 24 hour  Intake 980 ml  Output 1200 ml  Net -220 ml  Assessment/Plan:  42 y.o. male is s/p Right above the knee amputation  1 Day Post-Op   PLAN: Advance diet as tolerated.  Pain medication PRN  Restart patients Eliquis this evening. 5 mg BID  PT/OT  Placement to rehab early next week.   DVT prophylaxis:  ASA 81 mg Daily.    Marcie Bal [facility] 09/21/2022 10:12 AM

## 2022-09-21 NOTE — Progress Notes (Signed)
Date of Admission:  09/12/2022     ID: Martin Mullen is a 42 y.o. male Principal Problem:   Acute lower limb ischemia Active Problems:   HTN (hypertension)   Uncontrolled type 2 diabetes mellitus with hyperglycemia, with long-term current use of insulin (HCC)   CAD S/P percutaneous coronary angioplasty   Recurrent strokes (HCC)   Diabetic foot ulcer associated with type 2 diabetes mellitus (HCC)   Arterial occlusion   Gangrene (HCC)    Subjective: Pt alert Feeling better Sitting in chair and having lunch  Medications:   aspirin EC  81 mg Oral Daily   insulin aspart  0-15 Units Subcutaneous TID WC   nicotine  21 mg Transdermal Daily   polyethylene glycol  17 g Oral Daily   senna-docusate  2 tablet Oral BID   sodium chloride flush  3 mL Intravenous Q12H    Objective: Vital signs in last 24 hours: Patient Vitals for the past 24 hrs:  BP Temp Temp src Pulse Resp SpO2 Height Weight  09/21/22 0833 (!) 128/91 98.3 F (36.8 C) -- 85 18 98 % -- --  09/21/22 0407 (!) 147/83 97.8 F (36.6 C) Oral 89 -- 100 % -- --  09/20/22 1915 108/76 97.6 F (36.4 C) -- 86 -- 97 % -- --  09/20/22 1633 107/66 97.6 F (36.4 C) Oral 85 12 96 % -- --  09/20/22 1610 139/80 -- -- 88 16 96 % -- --  09/20/22 1600 113/63 (!) 97.1 F (36.2 C) -- 86 14 95 % -- --  09/20/22 1545 102/73 -- -- 78 (!) 9 97 % -- --  09/20/22 1530 104/78 -- -- 81 12 99 % -- --  09/20/22 1515 104/66 97.7 F (36.5 C) -- 79 (!) 8 95 % -- --  09/20/22 1500 108/76 -- -- 81 14 97 % -- --  09/20/22 1445 106/69 (!) 97 F (36.1 C) -- 86 (!) 9 96 % -- --  09/20/22 1430 107/76 -- -- 81 12 100 % -- --  09/20/22 1415 116/79 -- -- 87 13 100 % -- --  09/20/22 1400 98/70 -- -- 84 10 100 % -- --  09/20/22 1345 94/72 -- -- 78 10 100 % -- --  09/20/22 1343 112/71 (!) 96.9 F (36.1 C) -- 81 11 99 % -- --  09/20/22 1342 (!) 89/69 -- -- 80 -- 100 % -- --  09/20/22 1107 135/86 99.8 F (37.7 C) Temporal (!) 101 16 95 % 5\' 11"  (1.803 m)  76.7 kg       PHYSICAL EXAM:  General: Alert,  Lungs: Clear to auscultation bilaterally. No Wheezing or Rhonchi. No rales. Heart: Regular rate and rhythm, no murmur, rub or gallop. Abdomen: Soft, non-tender,not distended. Bowel sounds normal. No masses Extremities: rt AKA Skin: No rashes or lesions. Or bruising Lymph: Cervical, supraclavicular normal. Neurologic: Grossly non-focal  Lab Results    Latest Ref Rng & Units 09/21/2022    5:03 AM 09/20/2022    4:29 AM 09/19/2022    4:42 AM  CBC  WBC 4.0 - 10.5 K/uL 10.7  13.9  11.7   Hemoglobin 13.0 - 17.0 g/dL 40.9  81.1  91.4   Hematocrit 39.0 - 52.0 % 32.5  32.3  34.7   Platelets 150 - 400 K/uL 252  195  200        Latest Ref Rng & Units 09/21/2022    5:03 AM 09/20/2022    4:29 AM 09/19/2022  4:42 AM  CMP  Glucose 70 - 99 mg/dL 431  540  086   BUN 6 - 20 mg/dL 8  7  7    Creatinine 0.61 - 1.24 mg/dL 7.61  9.50  9.32   Sodium 135 - 145 mmol/L 136  132  131   Potassium 3.5 - 5.1 mmol/L 3.7  3.9  3.8   Chloride 98 - 111 mmol/L 106  100  97   CO2 22 - 32 mmol/L 24  24  24    Calcium 8.9 - 10.3 mg/dL 8.2  8.1  8.4       Microbiology: Wc - multiple flora Studies/Results: No results found.   Assessment/Plan:  Critical limb ischemia with gangrene rt foot- awaiting BKA Fever ldue to above resolved Patient is on broad-spectrum antibiotics of Vanco cefepime and Flagyl for sepsis protocol Pt had AKA yesterday Doing well today Can DC antibiotics tomorrow  Peripheral artery disease severe thrombosis of the right lower extremity status post thrombectomy, thrombolysis and repeat thrombectomy with stent placement    Encephalopathy- resolved    Diabetes management as per primary team   History of CVA secondary to prothrombin gene mutation.  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

## 2022-09-22 DIAGNOSIS — I96 Gangrene, not elsewhere classified: Secondary | ICD-10-CM

## 2022-09-22 DIAGNOSIS — I998 Other disorder of circulatory system: Secondary | ICD-10-CM | POA: Diagnosis not present

## 2022-09-22 LAB — GLUCOSE, CAPILLARY
Glucose-Capillary: 166 mg/dL — ABNORMAL HIGH (ref 70–99)
Glucose-Capillary: 208 mg/dL — ABNORMAL HIGH (ref 70–99)
Glucose-Capillary: 212 mg/dL — ABNORMAL HIGH (ref 70–99)
Glucose-Capillary: 222 mg/dL — ABNORMAL HIGH (ref 70–99)

## 2022-09-22 MED ORDER — ORAL CARE MOUTH RINSE: 15.0000 mL | OROMUCOSAL | Status: DC | PRN

## 2022-09-22 MED ORDER — NICOTINE 14 MG/24HR TD PT24
14.0000 mg | MEDICATED_PATCH | Freq: Every day | TRANSDERMAL | Status: DC
  Administered 2022-09-22 – 2022-09-28 (×7): 14 mg via TRANSDERMAL
  Filled 2022-09-22 (×7): qty 1

## 2022-09-22 NOTE — Progress Notes (Addendum)
Mobility Specialist - Progress Note     09/22/22 1112  Mobility  Activity Stood at bedside;Transferred from chair to bed;Dangled on edge of bed  Level of Assistance Minimal assist, patient does 75% or more  Assistive Device Front wheel walker  Distance Ambulated (ft) 3 ft  Range of Motion/Exercises Active  RLE Weight Bearing NWB  Activity Response Tolerated well  Mobility Referral Yes  $Mobility charge 1 Mobility  Mobility Specialist Start Time (ACUTE ONLY) 1056  Mobility Specialist Stop Time (ACUTE ONLY) 1112  Mobility Specialist Time Calculation (min) (ACUTE ONLY) 16 min   Pt resting in recliner on RA upon entry. Pt STS and 3 step/hop pivots to bed Min/ModA with manual assistance turning the walker. Pt lowered to bed and performed bed mobility MinA. Pt left in bed with needs in reach. Pt bed alarm activated.   Johnathan Hausen Mobility Specialist 09/22/22, 11:33 AM

## 2022-09-22 NOTE — Plan of Care (Signed)
  Problem: Education: Goal: Ability to describe self-care measures that may prevent or decrease complications (Diabetes Survival Skills Education) will improve Outcome: Progressing Goal: Individualized Educational Video(s) Outcome: Progressing   

## 2022-09-22 NOTE — Progress Notes (Signed)
Progress Note    09/22/2022 11:42 AM 2 Days Post-Op  Subjective:  Martin Mullen is a 42 y.o. 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   Patient is now POD#2 from a right above the knee amputation. He was resting comfortably in bed this morning. No complaints overnight. No noted drainage or bleeding at the operative site. Dressing remains dry and intact. Vitals all remain stable.   Vitals:   09/22/22 0438 09/22/22 0815  BP: (!) 159/88 136/88  Pulse: (!) 104 (!) 101  Resp: 18 18  Temp: 98.9 F (37.2 C) 98 F (36.7 C)  SpO2: 98% 100%   Physical Exam: Cardiac:  RRR, with tachycardic Rate, normal S1, S2.  No murmurs appreciated Lungs: Clear throughout on auscultation, no rales, rhonchi or wheezing noted. Incisions: Left groin incision with erythema.  Dressing applied, clean dry and intact.  No hematoma noted Extremities: Lower extremity is warm to touch with palpable pulses.  Right lower extremity below the knee cool to touch with mottled purple foot and toes.  Without Doppler pulses. Abdomen: Positive bowel sounds throughout, soft, nontender and nondistended. Neurologic: Alert and oriented to 2,name and place,  history of CVA x 3.  Patient unable to follow commands appropriately all the time  CBC    Component Value Date/Time   WBC 10.7 (H) 09/21/2022 0503   RBC 3.56 (L) 09/21/2022 0503   HGB 10.9 (L) 09/21/2022 0503   HGB 17.9 (H) 08/11/2021 0937   HCT 32.5 (L) 09/21/2022 0503   HCT 51.0 08/11/2021 0937   PLT 252 09/21/2022 0503   PLT 261 08/11/2021 0937   MCV 91.3 09/21/2022 0503   MCV 93 08/11/2021 0937   MCV 93 01/29/2013 0040   MCH 30.6 09/21/2022 0503   MCHC 33.5 09/21/2022 0503   RDW 13.6 09/21/2022 0503   RDW 12.4 08/11/2021 0937   RDW  13.8 01/29/2013 0040   LYMPHSABS 1.8 09/16/2022 1618   LYMPHSABS 4.8 (H) 08/11/2021 0937   MONOABS 0.7 09/16/2022 1618   EOSABS 0.1 09/16/2022 1618   EOSABS 0.5 (H) 08/11/2021 0937   BASOSABS 0.1 09/16/2022 1618   BASOSABS 0.1 08/11/2021 0937    BMET    Component Value Date/Time   NA 136 09/21/2022 0503   NA 140 08/11/2021 0937   NA 139 01/29/2013 0040   K 3.7 09/21/2022 0503   K 4.0 01/29/2013 0040   CL 106 09/21/2022 0503   CL 107 01/29/2013 0040   CO2 24 09/21/2022 0503   CO2 28 01/29/2013 0040   GLUCOSE 176 (H) 09/21/2022 0503   GLUCOSE 228 (H) 01/29/2013 0040   BUN 8 09/21/2022 0503   BUN 11 08/11/2021 0937   BUN 15 01/29/2013 0040   CREATININE 0.67 09/21/2022 0503   CREATININE 0.82 08/11/2022 1455   CALCIUM 8.2 (L) 09/21/2022 0503   CALCIUM 9.2 01/29/2013 0040   GFRNONAA >60 09/21/2022 0503   GFRNONAA >60 01/29/2013 0040   GFRAA 104 11/13/2019 1550   GFRAA >60 01/29/2013 0040    INR    Component Value Date/Time   INR 1.1 09/16/2022 1618     Intake/Output Summary (Last 24 hours) at 09/22/2022 1142 Last data filed at 09/22/2022 1100 Gross per 24 hour  Intake 240 ml  Output --  Net 240 ml     Assessment/Plan:  42 y.o. male is s/p  Right above the knee amputation   2 Days Post-Op   PLAN: Advance diet as tolerated.  Pain medication PRN  Continue Eliquis 5 mg BID  PT/OT  Placement to rehab early next week.    DVT prophylaxis:  ASA 81 mg Daily.    Marcie Bal [facility] 09/22/2022 11:42 AM

## 2022-09-22 NOTE — Progress Notes (Signed)
Physical Therapy Treatment Patient Details Name: Martin Mullen MRN: [id number] DOB: 1980/12/07 Today's Date: 09/22/2022   History of Present Illness Martin Mullen is a 42 y.o. 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  During session pt able to fully stand up to RW 1/5 trials with 1 assist (pt had difficulty transitioning hands from arms of recliner to RW).  Will continue to focus on strengthening, balance, and progressive functional mobility during hospitalization.    If plan is discharge home, recommend the following: A lot of help with bathing/dressing/bathroom;Help with stairs or ramp for entrance;Assist for transportation;Assistance with cooking/housework;Two people to help with walking and/or transfers   Can travel by private vehicle      No  Equipment Recommendations  Other (comment) (TBD at next facility)    Recommendations for Other Services Rehab consult     Precautions / Restrictions Precautions Precautions: Fall Restrictions Weight Bearing Restrictions: Yes RLE Weight Bearing: Non weight bearing     Mobility  Bed Mobility               General bed mobility comments: Deferred (pt in recliner beginning/end of session)    Transfers Overall transfer level: Needs assistance Equipment used: Rolling walker (2  wheels) Transfers: Sit to/from Stand Sit to Stand: Max assist           General transfer comment: x5 trials sit to stand; pt able to push up from chair on own (clear bottom from recliner) but unable to transition to full stand 4/5 trials (able to stand up 1x with max assist); vc's for overall technique    Ambulation/Gait               General Gait Details: pt unable to "hop" with RW use, 1 assist, and max cueing   Stairs             Wheelchair Mobility     Tilt Bed    Modified Rankin (Stroke Patients Only)       Balance Overall balance assessment: Needs assistance Sitting-balance support: No upper extremity supported, Feet supported Sitting balance-Leahy Scale: Good Sitting balance - Comments: steady reaching within BOS   Standing balance support: Bilateral upper extremity supported Standing balance-Leahy Scale: Fair Standing balance comment: steady static standing with B UE support on RW                            Cognition Arousal: Alert Behavior During Therapy: Flat affect Overall Cognitive Status: Within Functional Limits for tasks assessed                                 General Comments: slower processing; increased time for activities        Exercises General Exercises - Lower Extremity Ankle Circles/Pumps: AROM, Strengthening, Left,  10 reps, Seated Long Arc Quad: AROM, Strengthening, Left, 10 reps, Seated Hip Flexion/Marching: AROM, Left, AAROM, Right, Strengthening, Both, 10 reps, Seated    General Comments General comments (skin integrity, edema, etc.): dressing intact R LE residual limb.  Nursing cleared pt for participation in physical therapy.  Pt agreeable to PT session.      Pertinent Vitals/Pain Pain Assessment Pain Assessment: 0-10 Pain Score: 8  Pain Location: R LE residual limb Pain Descriptors / Indicators: Discomfort, Aching, Throbbing Pain Intervention(s): Limited activity within patient's  tolerance, Monitored during session, Premedicated before session, Repositioned, Other (comment) (RN notified) HR 100-113 bpm during session.    Home Living                          Prior Function            PT Goals (current goals can now be found in the care plan section) Acute Rehab PT Goals Patient Stated Goal: to improve mobility PT Goal Formulation: With patient/family Time For Goal Achievement: 10/05/22 Potential to Achieve Goals: Good Progress towards PT goals: Progressing toward goals    Frequency    7X/week      PT Plan      Co-evaluation              AM-PAC PT "6 Clicks" Mobility   Outcome Measure  Help needed turning from your back to your side while in a flat bed without using bedrails?: A Lot Help needed moving from lying on your back to sitting on the side of a flat bed without using bedrails?: A Lot Help needed moving to and from a bed to a chair (including a wheelchair)?: A Lot Help needed standing up from a chair using your arms (e.g., wheelchair or bedside chair)?: Total Help needed to walk in hospital room?: Total Help needed climbing 3-5 steps with a railing? : Total 6 Click Score: 9    End of Session Equipment Utilized During Treatment: Gait belt Activity Tolerance: Patient limited by pain;Patient limited by fatigue Patient left: in chair;with call bell/phone within reach;with chair alarm set Nurse Communication: Mobility status;Precautions;Other (comment) (pt's pain status) PT Visit Diagnosis: Other abnormalities of gait and mobility (R26.89);Muscle weakness (generalized) (M62.81);Pain;History of falling (Z91.81) Pain - Right/Left: Right Pain - part of body: Leg     Time: 4098-1191 PT Time Calculation (min) (ACUTE ONLY): 17 min  Charges:    $Therapeutic Activity: 8-22 mins PT General Charges $$ ACUTE PT VISIT: 1 Visit                     Martin Mullen, PT 09/22/22, 12:23 PM

## 2022-09-22 NOTE — Progress Notes (Signed)
PROGRESS NOTE    Martin Mullen   GNF:[id number] DOB: [birth year]  DOA: 09/12/2022 Date of Service: 09/22/22 PCP: [name], NP     Brief Narrative / Hospital Course:  Martin Mullen is a 42 y.o. 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 He noted that he had not been taking his Eliquis for approximately 3 months. When asked by admitting hospitalist him why he discontinued, he stated he no longer wanted to take it but did not have any particular reason for that.  07/29: to ED, Vascular surgery was consulted and patient started on IV heparin. [facility] contacted for admission.  07/30: S/p angio and stenting on the right foot.  Status post thrombectomy.  Had a abrupt change in his pulse exam last night suggestive of foot ischemia.  Likely failure of Aggrastat per vascular 07/31: Overnight he had absent pulses from the knee down on Doppler.  Foot was becoming cyanotic and cold. Plan for repeat angio and thrombolysis with continuous tPA infusion today 08/01: Angio and stenting of the right LE, mechanical thrombectomy 08/02: Likely AKA early next week once family decides over the weekend 08/03: Being treated for suspected sepsis with IV antibiotics.  Increased oxycodone dose for better pain control 08/04: Patient mentally clear and agreeable for AKA 08/05: Plan for AKA on 8/6 per vascular surgery 08/06: Right above-the-knee amputation  08/07-08/08: stable, pending evaluation for CIR  Consultants:  Vascular Surgery  Infectious disease  Hematology/Oncology  Procedures: 07/30: Angio and stenting of the  right LE, mechanical thrombectomy 08/01: Repeat Angio and stenting of the right LE, mechanical thrombectomy 08/06: Right above-the-knee amputation       ASSESSMENT & PLAN:   Principal Problem:   Acute lower limb ischemia Active Problems:   Diabetic foot ulcer associated with type 2 diabetes mellitus (HCC)   Recurrent strokes (HCC)   Uncontrolled type 2 diabetes mellitus with hyperglycemia, with long-term current use of insulin (HCC)   HTN (hypertension)   CAD S/P percutaneous coronary angioplasty   Arterial occlusion   Gangrene (HCC)  Acute right lower limb ischemia:  Likely secondary to noncompliance w/ eliquis.  Restart eliquis today  Oxycodone, morphine prn for pain.  Continue w/ neurovascular checks  POD1 s/p R AKA   Sepsis likely due to diabetic foot ulcer/infection:  Fever, tachycardia, hypotension on 8/2 likely due to diabetic foot ulcer/gangrene Diabetic foot ulcer involving the second right toe that has been since amputated. Remains an ulcer at his amputation site, wound culture growing gram-negative rods, gram-positive cocci in pairs continue IV vancomycin, Flagyl, cefepime for now, likely transition to oral antibiotic in next 1 to 2 days.   Removed femoral triple-lumen and Foley on 8/3.  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 Has been on chronic Eliquis, but self discontinued 3 months ago.  Continue Eliquis  Seen by oncology.  Recommends discharge on Eliquis and antiplatelet agents per oncology   DM2 poorly controlled, HbA1c 7.7.  Continue on SSI w/ accuchecks    Hx of CAD:  s/p percutaneous coronary angioplasty.  Has not been taking eliquis at home.  Off heparin, back to Eliquis  HTN:  As needed hydralazine and labetalol   Goals of care conversation  Palliative care seen.   Full code     DVT prophylaxis: eliquis  Pertinent IV fluids/nutrition: no continuous IV fluids, carb diet  Central  lines / invasive devices: none  Code Status: FULL CODE  ACP documentation reviewed: 09/21/22 advanced directive on file, [name] is HCPOA  Current Admission Status: inpatient   TOC needs / Dispo plan: expect will need CIR or SNF rehab  Barriers to discharge / significant pending items: pending rehab assessment w/ CIR             Subjective / Brief ROS:  Patient reports no concerns today other than wants to get out of his chair back to bed  Denies CP/SOB.  Pain okay but a bit more sore today.  Denies new weakness.  Tolerating diet.  Reports no concerns w/ urination/defecation.   Family Communication: none at this time     Objective Findings:  Vitals:   09/21/22 1634 09/21/22 1942 09/22/22 0438 09/22/22 0815  BP: 129/66 131/66 (!) 159/88 136/88  Pulse: (!) 110 93 (!) 104 (!) 101  Resp: 18 12 18 18   Temp: 98 F (36.7 C) 98 F (36.7 C) 98.9 F (37.2 C) 98 F (36.7 C)  TempSrc:   Oral   SpO2: 97% 96% 98% 100%  Weight:      Height:        Intake/Output Summary (Last 24 hours) at 09/22/2022 1212 Last data filed at 09/22/2022 1100 Gross per 24 hour  Intake 240 ml  Output --  Net 240 ml   Filed Weights   09/14/22 1324 09/18/22 0500 09/20/22 1107  Weight: 86.2 kg 76.7 kg 76.7 kg    Examination:  Physical Exam Constitutional:      General: He is not in acute distress. Cardiovascular:     Rate and Rhythm: Normal rate and regular rhythm.  Pulmonary:     Effort: Pulmonary effort is normal.     Breath sounds: Normal breath sounds.  Abdominal:     General: Abdomen is flat.     Palpations: Abdomen is soft.  Musculoskeletal:     Left lower leg: No edema.  Skin:    General: Skin is warm and dry.  Neurological:     Mental Status: He is alert. Mental status is at baseline.  Psychiatric:        Mood and Affect: Mood normal.        Behavior: Behavior normal.          Scheduled Medications:   apixaban  5 mg Oral BID   aspirin EC  81 mg Oral  Daily   insulin aspart  0-15 Units Subcutaneous TID WC   nicotine  14 mg Transdermal Daily   polyethylene glycol  17 g Oral Daily   senna-docusate  2 tablet Oral BID   sodium chloride flush  3 mL Intravenous Q12H    Continuous Infusions:  sodium chloride 250 mL (09/17/22 1548)   sodium chloride 75 mL/hr at 09/22/22 0526   ceFEPime (MAXIPIME) IV 2 g (09/22/22 0538)   metronidazole 500 mg (09/22/22 0734)   vancomycin 1,500 mg (09/22/22 0532)    PRN Medications:  sodium chloride, acetaminophen, hydrALAZINE, HYDROmorphone (DILAUDID) injection, labetalol, midazolam, ondansetron (ZOFRAN) IV, mouth rinse, oxyCODONE, sodium chloride flush  Antimicrobials from admission:  Anti-infectives (From admission, onward)    Start     Dose/Rate Route Frequency Ordered Stop   09/17/22 0100  vancomycin (  VANCOREADY) IVPB 1500 mg/300 mL        1,500 mg 150 mL/hr over 120 Minutes Intravenous Every 12 hours 09/16/22 1735     09/17/22 0000  vancomycin (VANCOREADY) IVPB 1750 mg/350 mL  Status:  Discontinued        1,750 mg 175 mL/hr over 120 Minutes Intravenous Every 12 hours 09/16/22 1643 09/16/22 1735   09/16/22 1700  ceFEPIme (MAXIPIME) 2 g in sodium chloride 0.9 % 100 mL IVPB  Status:  Discontinued        2 g 200 mL/hr over 30 Minutes Intravenous  Once 09/16/22 1602 09/16/22 1608   09/16/22 1700  metroNIDAZOLE (FLAGYL) IVPB 500 mg        500 mg 100 mL/hr over 60 Minutes Intravenous Every 12 hours 09/16/22 1602 09/23/22 1859   09/16/22 1700  ceFEPIme (MAXIPIME) 2 g in sodium chloride 0.9 % 100 mL IVPB  Status:  Discontinued        2 g 200 mL/hr over 30 Minutes Intravenous Every 8 hours 09/16/22 1608 09/16/22 1616   09/16/22 1615  ceFEPIme (MAXIPIME) 2 g in sodium chloride 0.9 % 100 mL IVPB        2 g 200 mL/hr over 30 Minutes Intravenous Every 8 hours 09/16/22 1616     09/16/22 1430  vancomycin (VANCOCIN) IVPB 1000 mg/200 mL premix  Status:  Discontinued        1,000 mg 200 mL/hr over 60 Minutes  Intravenous Every 24 hours 09/16/22 1333 09/16/22 1643   09/15/22 1030  ceFAZolin (ANCEF) IVPB 2g/100 mL premix        2 g 200 mL/hr over 30 Minutes Intravenous On call to O.R. 09/15/22 1028 09/15/22 1115   09/14/22 1012  ceFAZolin (ANCEF) IVPB 2g/100 mL premix        2 g 200 mL/hr over 30 Minutes Intravenous 30 min pre-op 09/14/22 1012 09/14/22 1053   09/13/22 1347  ceFAZolin (ANCEF) IVPB 2g/100 mL premix        2 g 200 mL/hr over 30 Minutes Intravenous 30 min pre-op 09/13/22 1347 09/13/22 1450           Data Reviewed:  I have personally reviewed the following...  CBC: Recent Labs  Lab 09/16/22 1618 09/17/22 0355 09/18/22 0408 09/19/22 0442 09/20/22 0429 09/21/22 0503  WBC 10.2 8.8 8.0 11.7* 13.9* 10.7*  NEUTROABS 7.5  --   --   --   --   --   HGB 12.1* 11.2* 11.4* 12.3* 11.3* 10.9*  HCT 34.4* 32.3* 32.0* 34.7* 32.3* 32.5*  MCV 88.7 89.7 87.0 85.9 88.0 91.3  PLT 162 143* 152 200 195 252   Basic Metabolic Panel: Recent Labs  Lab 09/16/22 1618 09/17/22 0355 09/18/22 0408 09/19/22 0442 09/20/22 0429 09/21/22 0503  NA 129*  --  131* 131* 132* 136  K 3.8  --  3.5 3.8 3.9 3.7  CL 98  --  99 97* 100 106  CO2 22  --  24 24 24 24   GLUCOSE 152*  --  171* 248* 235* 176*  BUN 9  --  7 7 7 8   CREATININE 0.89 0.75 0.75 0.78 0.69 0.67  CALCIUM 7.8*  --  7.8* 8.4* 8.1* 8.2*   GFR: Estimated Creatinine Clearance: 128.1 mL/min (by C-G formula based on SCr of 0.67 mg/dL). Liver Function Tests: Recent Labs  Lab 09/16/22 1618  AST 17  ALT 8  ALKPHOS 84  BILITOT 0.5  PROT 6.0*  ALBUMIN 2.5*   No  results for input(s): "LIPASE", "AMYLASE" in the last 168 hours. No results for input(s): "AMMONIA" in the last 168 hours. Coagulation Profile: Recent Labs  Lab 09/16/22 1618  INR 1.1   Cardiac Enzymes: No results for input(s): "CKTOTAL", "CKMB", "CKMBINDEX", "TROPONINI" in the last 168 hours. BNP (last 3 results) No results for input(s): "PROBNP" in the last 8760  hours. HbA1C: No results for input(s): "HGBA1C" in the last 72 hours. CBG: Recent Labs  Lab 09/21/22 0832 09/21/22 1142 09/21/22 1636 09/21/22 2156 09/22/22 0814  GLUCAP 170* 132* 220* 128* 166*   Lipid Profile: No results for input(s): "CHOL", "HDL", "LDLCALC", "TRIG", "CHOLHDL", "LDLDIRECT" in the last 72 hours. Thyroid Function Tests: No results for input(s): "TSH", "T4TOTAL", "FREET4", "T3FREE", "THYROIDAB" in the last 72 hours. Anemia Panel: No results for input(s): "VITAMINB12", "FOLATE", "FERRITIN", "TIBC", "IRON", "RETICCTPCT" in the last 72 hours. Most Recent Urinalysis On File:     Component Value Date/Time   COLORURINE YELLOW (A) 09/16/2022 1654   APPEARANCEUR HAZY (A) 09/16/2022 1654   LABSPEC 1.017 09/16/2022 1654   PHURINE 5.0 09/16/2022 1654   GLUCOSEU NEGATIVE 09/16/2022 1654   HGBUR MODERATE (A) 09/16/2022 1654   BILIRUBINUR NEGATIVE 09/16/2022 1654   KETONESUR 20 (A) 09/16/2022 1654   PROTEINUR 30 (A) 09/16/2022 1654   NITRITE NEGATIVE 09/16/2022 1654   LEUKOCYTESUR NEGATIVE 09/16/2022 1654   Sepsis Labs: @LABRCNTIP (procalcitonin:4,lacticidven:4) Microbiology: Recent Results (from the past 240 hour(s))  MRSA Next Gen by PCR, Nasal     Status: None   Collection Time: 09/13/22 12:14 PM   Specimen: Nasal Mucosa; Nasal Swab  Result Value Ref Range Status   MRSA by PCR Next Gen NOT DETECTED NOT DETECTED Final    Comment: (NOTE) The GeneXpert MRSA Assay (FDA approved for NASAL specimens only), is one component of a comprehensive MRSA colonization surveillance program. It is not intended to diagnose MRSA infection nor to guide or monitor treatment for MRSA infections. Test performance is not FDA approved in patients less than 59 years old. Performed at [facility], [address] [place], [place] [address]   Culture, blood (Routine X 2) w Reflex to ID Panel     Status: None   Collection Time: 09/16/22  4:19 PM   Specimen: BLOOD  Result  Value Ref Range Status   Specimen Description BLOOD RIGHT [facility]  Final   Special Requests   Final    BOTTLES DRAWN AEROBIC AND ANAEROBIC Blood Culture adequate volume   Culture   Final    NO GROWTH 5 DAYS Performed at [facility], [address], [place], [place] [address]    Report Status 09/21/2022 FINAL  Final  Culture, blood (Routine X 2) w Reflex to ID Panel     Status: None   Collection Time: 09/16/22  4:24 PM   Specimen: BLOOD RIGHT HAND  Result Value Ref Range Status   Specimen Description BLOOD RIGHT HAND  Final   Special Requests   Final    BOTTLES DRAWN AEROBIC AND ANAEROBIC Blood Culture adequate volume   Culture   Final    NO GROWTH 5 DAYS Performed at [facility], [address], [place], [place] [address]    Report Status 09/21/2022 FINAL  Final  Aerobic Culture w Gram Stain (superficial specimen)     Status: Abnormal   Collection Time: 09/16/22  7:35 PM   Specimen: Wound  Result Value Ref Range Status   Specimen Description   Final    WOUND Performed at  [facility], [address] [place], [place] [address]    Special Requests   Final    RIGHT FOREARM Performed at [facility], [address] [place], [place] [address]    Gram Stain   Final    RARE SQUAMOUS EPITHELIAL CELLS PRESENT ABUNDANT GRAM NEGATIVE RODS FEW GRAM POSITIVE COCCI IN PAIRS Performed at [facility], [address] [place], [place] [address]    Culture MULTIPLE ORGANISMS PRESENT, NONE PREDOMINANT (A)  Final   Report Status 09/19/2022 FINAL  Final      Radiology Studies last 3 days: No results found.           LOS: 10 days      Sunnie Nielsen, DO [facility] 09/22/2022, 12:12 PM    Dictation software may have been used to generate the above note. Typos may occur and escape review in typed/dictated notes. Please contact Dr Lyn Hollingshead directly for clarity if needed.  Staff may message me via secure chat in Epic   but this may not receive an immediate response,  please page me for urgent matters!  If 7PM-7AM, please contact night coverage [email or web address]

## 2022-09-22 NOTE — Progress Notes (Signed)
Occupational Therapy Treatment Patient Details Name: Martin Mullen MRN: [id number] DOB: 06/10/1980 Today's Date: 09/22/2022   History of present illness Martin Mullen is a 42 y.o. 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 Upon arrival to room pt reclined in bed, RN at bedside, pt agreeable to tx. Pt requires MAX A don L sock and gown at bed level. SETUP seated grooming tasks. MOD A for bed>chair squat pivot t/f. Educated on desensitization techniques with good return demonstration. Pt making good progress toward goals, will continue to follow POC. Discharge recommendation remains appropriate.        If plan is discharge home, recommend the following:  Two people to help with walking and/or transfers;Two people to help with bathing/dressing/bathroom   Equipment Recommendations  Other (comment) (defer)    Recommendations for Other Services      Precautions / Restrictions Precautions Precautions: Fall Restrictions Weight Bearing Restrictions: Yes RLE Weight Bearing: Non weight bearing       Mobility Bed Mobility Overal bed mobility: Needs Assistance Bed Mobility: Supine to Sit     Supine to sit: Mod assist, HOB elevated, Used rails          Transfers Overall transfer level: Needs assistance Equipment used: 1 person hand held assist Transfers: Bed to chair/wheelchair/BSC     Squat pivot transfers: Mod assist             Balance Overall balance assessment: Needs  assistance Sitting-balance support: Feet supported Sitting balance-Leahy Scale: Fair     Standing balance support: Bilateral upper extremity supported Standing balance-Leahy Scale: Poor                             ADL either performed or assessed with clinical judgement   ADL Overall ADL's : Needs assistance/impaired                                       General ADL Comments: MAX A don L sock and gown at bed level. SETUP seated grooming tasks. MOD A for simulated BSC t/f     Cognition Arousal: Alert Behavior During Therapy: Flat affect Overall Cognitive Status: Within Functional Limits for tasks assessed                                 General Comments: slower processing, increased time to complete transfers                   Pertinent Vitals/ Pain       Pain Assessment Pain Assessment: 0-10 Pain Score: 8  Pain Location: R LE residual limb Pain Descriptors / Indicators: Discomfort, Aching, Pounding Pain Intervention(s): Limited activity within patient's tolerance, RN gave pain meds during session   Frequency  Min 1X/week        Progress Toward Goals  OT Goals(current goals can now be found  in the care plan section)  Progress towards OT goals: Progressing toward goals  Acute Rehab OT Goals Patient Stated Goal: to walk OT Goal Formulation: With patient Time For Goal Achievement: 10/05/22 Potential to Achieve Goals: Good ADL Goals Pt Will Perform Grooming: with set-up;with supervision;standing Pt Will Perform Lower Body Dressing: sit to/from stand;with supervision Pt Will Transfer to Toilet: with modified independence;ambulating;bedside commode  Plan      Co-evaluation                 AM-PAC OT "6 Clicks" Daily Activity     Outcome Measure   Help from another person eating meals?: None Help from another person taking care of personal grooming?: A Little Help from another person toileting, which  includes using toliet, bedpan, or urinal?: A Lot Help from another person bathing (including washing, rinsing, drying)?: A Lot Help from another person to put on and taking off regular upper body clothing?: A Little Help from another person to put on and taking off regular lower body clothing?: A Lot 6 Click Score: 16    End of Session Equipment Utilized During Treatment: Rolling walker (2 wheels)  OT Visit Diagnosis: Other abnormalities of gait and mobility (R26.89);Muscle weakness (generalized) (M62.81)   Activity Tolerance Patient tolerated treatment well   Patient Left in chair;with call bell/phone within reach;with chair alarm set   Nurse Communication Mobility status        Time: 4782-9562 OT Time Calculation (min): 33 min  Charges: OT General Charges $OT Visit: 1 Visit OT Treatments $Self Care/Home Management : 23-37 mins  Kathie Dike, M.S. OTR/L  09/22/22, 9:49 AM  ascom [phone]

## 2022-09-22 NOTE — Plan of Care (Signed)
  Problem: Clinical Measurements: Goal: Ability to maintain clinical measurements within normal limits will improve Outcome: Progressing   Problem: Elimination: Goal: Will not experience complications related to bowel motility Outcome: Progressing   Problem: Coping: Goal: Level of anxiety will decrease Outcome: Progressing   Problem: Nutrition: Goal: Adequate nutrition will be maintained Outcome: Progressing

## 2022-09-22 NOTE — Progress Notes (Signed)
[facility] IP rehab admissions - I spoke with POA Martin Mullen by telephone.  Currently patient does not have a discharge plan for after a potential inpatient rehab admission.  Martin Mullen is trying to find alternative living environment for patient because his current living situation is not safe.  I feel that it will take longer than a couple weeks for his rehab and he has no DC plan at this time.  Recommend pursuit of SNF placement for rehab to allow enough time for transition to ALF.  I will not be able to offer him a bed on CIR because of the above.  I did tell Martin Mullen if something changes, she can call us for re-evaluation.  I will sign off for CIR at this time.  Call me for questions.  [phone]

## 2022-09-22 NOTE — TOC Progression Note (Signed)
Transition of Care [facility]) - Progression Note    Patient Details  Name: Martin Mullen MRN: [id number] Date of Birth: Dec 29, 1980  [facility] CM/SW Contact  Chapman Fitch, RN Phone Number: 09/22/2022, 11:25 AM  Clinical Narrative:    Therapy recommending CIR Patient screened for CIR candidacy.  Per CIR At this time, pt appears to be a potential candidate for CIR. And they have placed an order for rehab consult for full assessment     Expected Discharge Plan and Services                                               Social Determinants of Health (SDOH) Interventions SDOH Screenings   Food Insecurity: No Food Insecurity (09/22/2022)  Housing: Low Risk  (09/22/2022)  Transportation Needs: No Transportation Needs (09/22/2022)  Utilities: Not At Risk (09/22/2022)  Alcohol Screen: Low Risk  (07/01/2020)  Depression (PHQ2-9): High Risk (05/10/2022)  Financial Resource Strain: Low Risk  (06/10/2022)  Tobacco Use: High Risk (09/20/2022)    Readmission Risk Interventions     No data to display

## 2022-09-23 DIAGNOSIS — I998 Other disorder of circulatory system: Secondary | ICD-10-CM | POA: Diagnosis not present

## 2022-09-23 LAB — GLUCOSE, CAPILLARY
Glucose-Capillary: 182 mg/dL — ABNORMAL HIGH (ref 70–99)
Glucose-Capillary: 252 mg/dL — ABNORMAL HIGH (ref 70–99)
Glucose-Capillary: 239 mg/dL — ABNORMAL HIGH (ref 70–99)

## 2022-09-23 MED ORDER — LOSARTAN POTASSIUM 25 MG PO TABS
25.0000 mg | ORAL_TABLET | Freq: Every day | ORAL | Status: DC
  Administered 2022-09-23 – 2022-09-27 (×5): 25 mg via ORAL
  Filled 2022-09-23 (×5): qty 1

## 2022-09-23 NOTE — Progress Notes (Signed)
PROGRESS NOTE    Martin Mullen   ZOX:[id number] DOB: 1980-05-12  DOA: 09/12/2022 Date of Service: 09/23/22 PCP: [name], NP     Brief Narrative / Hospital Course:  Martin Mullen is a 42 y.o. 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 He noted that he had not been taking his Eliquis for approximately 3 months. When asked by admitting hospitalist him why he discontinued, he stated he no longer wanted to take it but did not have any particular reason for that.  07/29: to ED, Vascular surgery was consulted and patient started on IV heparin. TRH contacted for admission.  07/30: S/p angio and stenting on the right foot.  Status post thrombectomy.  Had a abrupt change in his pulse exam last night suggestive of foot ischemia.  Likely failure of Aggrastat per vascular 07/31: Overnight he had absent pulses from the knee down on Doppler.  Foot was becoming cyanotic and cold. Plan for repeat angio and thrombolysis with continuous tPA infusion today 08/01: Angio and stenting of the right LE, mechanical thrombectomy 08/02: Likely AKA early next week once family decides over the weekend 08/03: Being treated for suspected sepsis with IV antibiotics.  Increased oxycodone dose for better pain control 08/04: Patient mentally clear and agreeable for AKA 08/05: Plan for AKA on 8/6 per vascular surgery 08/06: Right above-the-knee amputation  08/07-08/08: stable, pending evaluation for CIR 08/09: CIR will not take him d/t social/living situation, TOC working on SNF   Consultants:  Vascular Surgery  Infectious  disease  Hematology/Oncology  Procedures: 07/30: Angio and stenting of the right LE, mechanical thrombectomy 08/01: Repeat Angio and stenting of the right LE, mechanical thrombectomy 08/06: Right above-the-knee amputation       ASSESSMENT & PLAN:   Principal Problem:   Acute lower limb ischemia Active Problems:   Diabetic foot ulcer associated with type 2 diabetes mellitus (HCC)   Recurrent strokes (HCC)   Uncontrolled type 2 diabetes mellitus with hyperglycemia, with long-term current use of insulin (HCC)   HTN (hypertension)   CAD S/P percutaneous coronary angioplasty   Arterial occlusion   Gangrene (HCC)  Acute right lower limb ischemia:  Likely secondary to noncompliance w/ eliquis.  Eliquis  Oxycodone, morphine prn for pain.  Continue w/ neurovascular checks  s/p R AKA, vascular following    Sepsis likely due to diabetic foot ulcer/infection:  Fever, tachycardia, hypotension on 8/2 likely due to diabetic foot ulcer/gangrene Diabetic foot ulcer involving the second right toe that has been since amputated. 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 Has been on chronic Eliquis, but self discontinued 3 months ago.  Continue Eliquis  Seen by oncology.  Recommends discharge on Eliquis and antiplatelet    DM2 poorly controlled, HbA1c 7.7.  Continue on SSI w/ accuchecks    Hx of CAD:  s/p percutaneous coronary angioplasty.  Has not been taking eliquis at home.  Off heparin, back to Eliquis Starting ARB, may add BB if BP tolerates  HTN:  Starting ARB, may add BB if BP tolerates    Goals of care conversation  Palliative care seen.   Full code     DVT prophylaxis: eliquis  Pertinent IV fluids/nutrition: no continuous IV fluids, carb diet  Central  lines / invasive devices: none  Code Status: FULL CODE  ACP documentation reviewed: 09/21/22 advanced directive on file, [name] is HCPOA  Current Admission Status: inpatient   TOC needs / Dispo plan: expect will need CIR or SNF rehab  Barriers to discharge / significant pending items: pending rehab assessment w/ CIR             Subjective / Brief ROS:  Patient reports no concerns today other than wants to get out of his chair back to use the restroom Denies CP/SOB.  Pain controlled  Denies new weakness.  Tolerating diet.    Family Communication: none at this time     Objective Findings:  Vitals:   09/22/22 1613 09/22/22 2044 09/23/22 0637 09/23/22 0829  BP: (!) 147/85 (!) 145/89 (!) 147/93 (!) 164/94  Pulse: 96 (!) 103 94 95  Resp: 18  18 18   Temp: 99.2 F (37.3 C) 98.6 F (37 C) 97.8 F (36.6 C) 98.3 F (36.8 C)  TempSrc: Oral  Oral Oral  SpO2: 100% 98% 100% 100%  Weight:      Height:        Intake/Output Summary (Last 24 hours) at 09/23/2022 1331 Last data filed at 09/23/2022 1021 Gross per 24 hour  Intake 8874.3 ml  Output 1800 ml  Net 7074.3 ml   Filed Weights   09/14/22 1324 09/18/22 0500 09/20/22 1107  Weight: 86.2 kg 76.7 kg 76.7 kg    Examination:  Physical Exam Constitutional:      General: He is not in acute distress. Cardiovascular:     Rate and Rhythm: Normal rate and regular rhythm.  Pulmonary:     Effort: Pulmonary effort is normal.     Breath sounds: Normal breath sounds.  Abdominal:     General: Abdomen is flat.     Palpations: Abdomen is soft.  Musculoskeletal:     Left lower leg: No edema.  Skin:    General: Skin is warm and dry.  Neurological:     Mental Status: He is alert. Mental status is at baseline.  Psychiatric:        Mood and Affect: Mood normal.        Behavior: Behavior normal.          Scheduled Medications:   apixaban  5 mg Oral BID   aspirin EC  81 mg Oral Daily   insulin aspart  0-15  Units Subcutaneous TID WC   losartan  25 mg Oral QHS   nicotine  14 mg Transdermal Daily   polyethylene glycol  17 g Oral Daily   senna-docusate  2 tablet Oral BID   sodium chloride flush  3 mL Intravenous Q12H    Continuous Infusions:  sodium chloride 250 mL (09/17/22 1548)    PRN Medications:  sodium chloride, acetaminophen, HYDROmorphone (DILAUDID) injection, midazolam, ondansetron (ZOFRAN) IV, mouth rinse, oxyCODONE, sodium chloride flush  Antimicrobials from admission:  Anti-infectives (From admission, onward)    Start     Dose/Rate Route Frequency Ordered Stop   09/17/22 0100  vancomycin (VANCOREADY) IVPB 1500 mg/300 mL        1,500 mg 150 mL/hr over 120 Minutes Intravenous Every 12 hours 09/16/22 1735 09/22/22 1821   09/17/22  0000  vancomycin (VANCOREADY) IVPB 1750 mg/350 mL  Status:  Discontinued        1,750 mg 175 mL/hr over 120 Minutes Intravenous Every 12 hours 09/16/22 1643 09/16/22 1735   09/16/22 1700  ceFEPIme (MAXIPIME) 2 g in sodium chloride 0.9 % 100 mL IVPB  Status:  Discontinued        2 g 200 mL/hr over 30 Minutes Intravenous  Once 09/16/22 1602 09/16/22 1608   09/16/22 1700  metroNIDAZOLE (FLAGYL) IVPB 500 mg        500 mg 100 mL/hr over 60 Minutes Intravenous Every 12 hours 09/16/22 1602 09/22/22 2024   09/16/22 1700  ceFEPIme (MAXIPIME) 2 g in sodium chloride 0.9 % 100 mL IVPB  Status:  Discontinued        2 g 200 mL/hr over 30 Minutes Intravenous Every 8 hours 09/16/22 1608 09/16/22 1616   09/16/22 1615  ceFEPIme (MAXIPIME) 2 g in sodium chloride 0.9 % 100 mL IVPB        2 g 200 mL/hr over 30 Minutes Intravenous Every 8 hours 09/16/22 1616 09/23/22 0559   09/16/22 1430  vancomycin (VANCOCIN) IVPB 1000 mg/200 mL premix  Status:  Discontinued        1,000 mg 200 mL/hr over 60 Minutes Intravenous Every 24 hours 09/16/22 1333 09/16/22 1643   09/15/22 1030  ceFAZolin (ANCEF) IVPB 2g/100 mL premix        2 g 200 mL/hr over 30 Minutes Intravenous On call  to O.R. 09/15/22 1028 09/15/22 1115   09/14/22 1012  ceFAZolin (ANCEF) IVPB 2g/100 mL premix        2 g 200 mL/hr over 30 Minutes Intravenous 30 min pre-op 09/14/22 1012 09/14/22 1053   09/13/22 1347  ceFAZolin (ANCEF) IVPB 2g/100 mL premix        2 g 200 mL/hr over 30 Minutes Intravenous 30 min pre-op 09/13/22 1347 09/13/22 1450           Data Reviewed:  I have personally reviewed the following...  CBC: Recent Labs  Lab 09/16/22 1618 09/17/22 0355 09/18/22 0408 09/19/22 0442 09/20/22 0429 09/21/22 0503  WBC 10.2 8.8 8.0 11.7* 13.9* 10.7*  NEUTROABS 7.5  --   --   --   --   --   HGB 12.1* 11.2* 11.4* 12.3* 11.3* 10.9*  HCT 34.4* 32.3* 32.0* 34.7* 32.3* 32.5*  MCV 88.7 89.7 87.0 85.9 88.0 91.3  PLT 162 143* 152 200 195 252   Basic Metabolic Panel: Recent Labs  Lab 09/16/22 1618 09/17/22 0355 09/18/22 0408 09/19/22 0442 09/20/22 0429 09/21/22 0503 09/23/22 0423  NA 129*  --  131* 131* 132* 136  --   K 3.8  --  3.5 3.8 3.9 3.7  --   CL 98  --  99 97* 100 106  --   CO2 22  --  24 24 24 24   --   GLUCOSE 152*  --  171* 248* 235* 176*  --   BUN 9  --  7 7 7 8   --   CREATININE 0.89   < > 0.75 0.78 0.69 0.67 0.57*  CALCIUM 7.8*  --  7.8* 8.4* 8.1* 8.2*  --    < > = values in this interval not displayed.   GFR: Estimated Creatinine Clearance: 128.1 mL/min (A) (by C-G formula based on SCr of 0.57 mg/dL (L)). Liver Function Tests: Recent Labs  Lab 09/16/22 1618  AST 17  ALT 8  ALKPHOS 84  BILITOT  0.5  PROT 6.0*  ALBUMIN 2.5*   No results for input(s): "LIPASE", "AMYLASE" in the last 168 hours. No results for input(s): "AMMONIA" in the last 168 hours. Coagulation Profile: Recent Labs  Lab 09/16/22 1618  INR 1.1   Cardiac Enzymes: No results for input(s): "CKTOTAL", "CKMB", "CKMBINDEX", "TROPONINI" in the last 168 hours. BNP (last 3 results) No results for input(s): "PROBNP" in the last 8760 hours. HbA1C: No results for input(s): "HGBA1C" in the last  72 hours. CBG: Recent Labs  Lab 09/22/22 0814 09/22/22 1216 09/22/22 1630 09/22/22 2125 09/23/22 0828  GLUCAP 166* 208* 212* 222* 182*   Lipid Profile: No results for input(s): "CHOL", "HDL", "LDLCALC", "TRIG", "CHOLHDL", "LDLDIRECT" in the last 72 hours. Thyroid Function Tests: No results for input(s): "TSH", "T4TOTAL", "FREET4", "T3FREE", "THYROIDAB" in the last 72 hours. Anemia Panel: No results for input(s): "VITAMINB12", "FOLATE", "FERRITIN", "TIBC", "IRON", "RETICCTPCT" in the last 72 hours. Most Recent Urinalysis On File:     Component Value Date/Time   COLORURINE YELLOW (A) 09/16/2022 1654   APPEARANCEUR HAZY (A) 09/16/2022 1654   LABSPEC 1.017 09/16/2022 1654   PHURINE 5.0 09/16/2022 1654   GLUCOSEU NEGATIVE 09/16/2022 1654   HGBUR MODERATE (A) 09/16/2022 1654   BILIRUBINUR NEGATIVE 09/16/2022 1654   KETONESUR 20 (A) 09/16/2022 1654   PROTEINUR 30 (A) 09/16/2022 1654   NITRITE NEGATIVE 09/16/2022 1654   LEUKOCYTESUR NEGATIVE 09/16/2022 1654   Sepsis Labs: @LABRCNTIP (procalcitonin:4,lacticidven:4) Microbiology: Recent Results (from the past 240 hour(s))  Culture, blood (Routine X 2) w Reflex to ID Panel     Status: None   Collection Time: 09/16/22  4:19 PM   Specimen: BLOOD  Result Value Ref Range Status   Specimen Description BLOOD RIGHT [facility]  Final   Special Requests   Final    BOTTLES DRAWN AEROBIC AND ANAEROBIC Blood Culture adequate volume   Culture   Final    NO GROWTH 5 DAYS Performed at [facility], [address] [place], [place] [address]    Report Status 09/21/2022 FINAL  Final  Culture, blood (Routine X 2) w Reflex to ID Panel     Status: None   Collection Time: 09/16/22  4:24 PM   Specimen: BLOOD RIGHT HAND  Result Value Ref Range Status   Specimen Description BLOOD RIGHT HAND  Final   Special Requests   Final    BOTTLES DRAWN AEROBIC AND ANAEROBIC Blood Culture adequate volume   Culture   Final    NO GROWTH 5 DAYS Performed at  [facility], [address], [place], [place] [address]    Report Status 09/21/2022 FINAL  Final  Aerobic Culture w Gram Stain (superficial specimen)     Status: Abnormal   Collection Time: 09/16/22  7:35 PM   Specimen: Wound  Result Value Ref Range Status   Specimen Description   Final    WOUND Performed at [facility], [address] [place], [place] [address]    Special Requests   Final    RIGHT FOREARM Performed at [facility], [address] [place], [place] [address]    Gram Stain   Final    RARE SQUAMOUS EPITHELIAL CELLS PRESENT ABUNDANT GRAM NEGATIVE RODS FEW GRAM POSITIVE COCCI IN PAIRS Performed at [facility], [address] [place], [place] [address]    Culture MULTIPLE ORGANISMS PRESENT, NONE PREDOMINANT (A)  Final   Report Status 09/19/2022 FINAL  Final      Radiology Studies last 3  days: No results found.           LOS: 11 days      Sunnie Nielsen, DO [facility] 09/23/2022, 1:31 PM    Dictation software may have been used to generate the above note. Typos may occur and escape review in typed/dictated notes. Please contact Dr Lyn Hollingshead directly for clarity if needed.  Staff may message me via secure chat in Epic  but this may not receive an immediate response,  please page me for urgent matters!  If 7PM-7AM, please contact night coverage [email or web address]

## 2022-09-23 NOTE — Progress Notes (Signed)
Occupational Therapy Treatment Patient Details Name: EWIN ROYSTER MRN: [id number] DOB: [birth year] Today's Date: 09/23/2022   History of present illness Xavien L Pinell is a 42 y.o. 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 Bacallao was seen for OT treatment on this date. Upon arrival to room pt seated in recliner, endorsing 8/10 RLE pain, but agreeable to OT Tx session. OT facilitated ADL management with education and assist as described below. See ADL section for additional details regarding occupational performance. Pt continues to be functionally limited by increased pain with mobility, decreased activity tolerance, and limited balance. Pt return demonstrates understanding of education provided t/o session. Pt is progressing toward OT goals and continues to benefit from skilled OT services to maximize return to PLOF and minimize risk of future falls, injury, caregiver burden, and readmission. Will continue to follow POC as written. Discharge recommendation remains appropriate.        If plan is discharge home, recommend the following:  Two people to help with walking and/or transfers;Two people to help with bathing/dressing/bathroom;Assist for transportation;Assistance with cooking/housework;Help with stairs or ramp for entrance   Equipment Recommendations  Other (comment)    Recommendations for Other Services      Precautions / Restrictions Precautions Precautions: Fall Restrictions Weight Bearing Restrictions: Yes RLE Weight Bearing: Non weight bearing        Mobility Bed Mobility Overal bed mobility: Needs Assistance Bed Mobility: Sit to Sidelying, Rolling       Sit to supine: Min assist Sit to sidelying: Min assist General bed mobility comments: vc's for technique; assist for trunk and to bring LEs over EOB    Transfers Overall transfer level: Needs assistance Equipment used: 1 person hand held assist Transfers: Bed to chair/wheelchair/BSC     Squat pivot transfers: Mod assist      Lateral/Scoot Transfers: Supervision       Balance Overall balance assessment: Needs assistance Sitting-balance support: No upper extremity supported, Feet supported Sitting balance-Leahy Scale: Good Sitting balance - Comments: steady reaching within BOS                                   ADL either performed or assessed with clinical judgement   ADL Overall ADL's : Needs assistance/impaired                                     Functional mobility during ADLs: Moderate assistance;Cueing for sequencing;Cueing for safety General ADL Comments: MOD A to squat pivot transfer from recliner to bed. Pt educated on residual limb management tecnhiques including gentle passive stretch (in supine), gentle massage and tapping with education to perform with clean hands and monitor for signs/symptoms of infection.    Extremity/Trunk Assessment              Vision Patient Visual Report: No change from baseline     Perception     Praxis      Cognition Arousal: Alert Behavior During Therapy: Flat  affect Overall Cognitive Status: Within Functional Limits for tasks assessed                                 General Comments: slower processing; increased time for activities        Exercises Other Exercises Other Exercises: OT facilitated ADL management with education and assist as described above. Pt return verbalizes understanding of education provided through teach back.    Shoulder  Instructions       General Comments dressing intact R LE residual limb    Pertinent Vitals/ Pain       Pain Assessment Pain Assessment: 0-10 Pain Score: 8  Pain Location: R LE residual limb Pain Descriptors / Indicators: Discomfort, Aching, Throbbing Pain Intervention(s): Limited activity within patient's tolerance, Monitored during session, Repositioned, Patient requesting pain meds-RN notified  Home Living                                          Prior Functioning/Environment              Frequency  Min 1X/week        Progress Toward Goals  OT Goals(current goals can now be found in the care plan section)  Progress towards OT goals: Progressing toward goals  Acute Rehab OT Goals Patient Stated Goal: To walk again OT Goal Formulation: With patient Time For Goal Achievement: 10/05/22 Potential to Achieve Goals: Good  Plan      Co-evaluation                 AM-PAC OT "6 Clicks" Daily Activity     Outcome Measure   Help from another person eating meals?: None Help from another person taking care of personal grooming?: A Little Help from another person toileting, which includes using toliet, bedpan, or urinal?: A Lot Help from another person bathing (including washing, rinsing, drying)?: A Lot Help from another person to put on and taking off regular upper body clothing?: A Little Help from another person to put on and taking off regular lower body clothing?: A Lot 6 Click Score: 16    End of Session    OT Visit Diagnosis: Other abnormalities of gait and mobility (R26.89);Muscle weakness (generalized) (M62.81)   Activity Tolerance Patient tolerated treatment well   Patient Left in bed;with call bell/phone within reach;with bed alarm set   Nurse Communication Mobility status        Time: 4782-9562 OT Time Calculation (min): 24 min  Charges: OT General Charges $OT Visit: 1 Visit OT Treatments $Self Care/Home Management :  23-37 mins  Rockney Ghee, M.S., OTR/L 09/23/22, 1:05 PM

## 2022-09-23 NOTE — TOC Initial Note (Addendum)
Transition of Care [facility]) - Initial/Assessment Note    Patient Details  Name: Martin Mullen MRN: [id number] Date of Birth: 03-06-1980  Transition of Care [facility]) CM/SW Contact:    Margarito Liner, LCSW Phone Number: 09/23/2022, 11:18 AM  Clinical Narrative:   CSW met with patient. No supports at bedside. CSW introduced role and explained that therapy recommendations would be discussed. Patient is agreeable to SNF placement. [facility] is first preference. CSW left message for admissions coordinator asking her to review. No further concerns. CSW encouraged patient to contact CSW as needed. CSW will continue to follow patient for support and facilitate discharge once medically stable.           1:19 pm: [facility] is unable to accept patient's insurance. [facility] does not accept patient's insurance. CSW left voicemails for the admissions coordinators at the following inpatient rehabs: [facility], [facility], [address], [place], and [place].      2:05 pm: [facility] confirmed they do accept his insurance so referral has been faxed to them. All SNF's in [place] have declined so expanded search to other counties. Patient is aware and agreeable. CSW asked patient about plan to move per [facility] IP rehab admissions coordinator note. Patient stated he did not really want to move. CSW encouraged him to speak to [place] about that.  3:02 pm: [facility] declined referral.  3:41 pm: [facility] and [facility] liaisons stated that the type of Medicaid the patient has does not cover SNF. CSW is checking with other facilities to see if they obtain the same information. CSW continues to wait for return calls from [facility], [facility], [place], and [facility].  Expected Discharge Plan: Skilled Nursing Facility Barriers to Discharge: English as a second language teacher, Continued Medical Work up, SNF Pending bed offer   Patient Goals and CMS Choice            Expected Discharge  Plan and Services     Post Acute Care Choice: Skilled Nursing Facility Living arrangements for the past 2 months: Mobile Home                                      Prior Living Arrangements/Services Living arrangements for the past 2 months: Mobile Home Lives with:: Roommate Patient language and need for interpreter reviewed:: Yes Do you feel safe going back to the place where you live?: Yes      Need for Family Participation in Patient Care: Yes (Comment) Care giver support system in place?: Yes (comment)   Criminal Activity/Legal Involvement Pertinent to Current Situation/Hospitalization: No - Comment as needed  Activities of Daily Living Home Assistive Devices/Equipment: Dan Humphreys (specify type) ADL Screening (condition at time of admission) Patient's cognitive ability adequate to safely complete daily activities?: Yes Is the patient deaf or have difficulty hearing?: No Does the patient have difficulty seeing, even when wearing glasses/contacts?: No Does the patient have difficulty concentrating, remembering, or making decisions?: No Patient able to express need for assistance with ADLs?: Yes Does the patient have difficulty dressing or bathing?: No Independently performs ADLs?: Yes (appropriate for developmental age) Does the patient have difficulty walking or climbing stairs?: No Weakness of Legs: None Weakness of Arms/Hands: None  Permission Sought/Granted Permission sought to share information with : Facility Industrial/product designer granted to share information with : Yes, Verbal Permission Granted     Permission granted to share info w AGENCY:  SNF's        Emotional Assessment Appearance:: Appears stated age Attitude/Demeanor/Rapport: Engaged, Gracious Affect (typically observed): Accepting, Appropriate, Calm, Pleasant Orientation: : Oriented to Self, Oriented to Place, Oriented to  Time, Oriented to Situation Alcohol / Substance Use: Not  Applicable Psych Involvement: No (comment)  Admission diagnosis:  Acute lower limb ischemia [I99.8] Patient Active Problem List   Diagnosis Date Noted   Gangrene of right foot (HCC) 09/21/2022   Gangrene (HCC) 09/19/2022   Arterial occlusion 09/13/2022   Acute lower limb ischemia 09/12/2022   PAD (peripheral artery disease) (HCC) 06/27/2022   Chronic anticoagulation 06/26/2022   Diabetic foot ulcer associated with type 2 diabetes mellitus (HCC) 06/26/2022   Overweight with body mass index (BMI) of 27 to 27.9 in adult 10/08/2020   Recurrent strokes (HCC) 08/27/2020   GERD (gastroesophageal reflux disease) 07/20/2019   CAD S/P percutaneous coronary angioplasty    Uncontrolled type 2 diabetes mellitus with hyperglycemia, with long-term current use of insulin (HCC) 07/09/2013   HTN (hypertension) 09/05/2012   Hyperlipidemia associated with type 2 diabetes mellitus (HCC) 09/05/2012   Adjustment disorder with mixed anxiety and depressed mood 09/05/2012   PCP:  [name], NP Pharmacy:   [id number] [place], [place] - [address] AT [facility] OF [address] [place] [address] Phone: [phone] Fax: [phone]  [facility] [address] [place] [address] Phone: [phone] Fax: [phone]  [facility] [address] [place] [address] Phone: [phone] Fax: [phone]     Social Determinants of Health (SDOH) Social History: SDOH Screenings   Food Insecurity: No Food Insecurity (09/22/2022)  Housing: Low Risk  (09/22/2022)  Transportation Needs: No Transportation Needs (09/22/2022)  Utilities: Not At Risk (09/22/2022)  Alcohol Screen: Low Risk  (07/01/2020)  Depression (PHQ2-9): High Risk (05/10/2022)  Financial Resource Strain: Low Risk  (06/10/2022)  Tobacco Use: High Risk (09/20/2022)   SDOH Interventions:     Readmission Risk  Interventions     No data to display

## 2022-09-23 NOTE — Progress Notes (Signed)
Physical Therapy Treatment Patient Details Name: Martin Mullen MRN: [id number] DOB: 12-16-1980 Today's Date: 09/23/2022   History of Present Illness Martin Mullen is a 42 y.o. 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  During session pt mod assist semi-supine to sitting EOB; max assist (2nd assist for safety) to stand up to RW from elevated bed height; and mod to max assist x2 for stand pivot bed to recliner with RW use.  Will continue to focus on strengthening and progressive functional mobility per pt tolerance.    If plan is discharge home, recommend the following: A lot of help with bathing/dressing/bathroom;Help with stairs or ramp for entrance;Assist for transportation;Assistance with cooking/housework;Two people to help with walking and/or transfers   Can travel by private vehicle      No  Equipment Recommendations  Other (comment) (TBD at next facility)    Recommendations for Other Services       Precautions / Restrictions Precautions Precautions: Fall Restrictions Weight Bearing Restrictions: Yes RLE Weight Bearing: Non weight bearing     Mobility  Bed Mobility Overal bed mobility: Needs Assistance Bed Mobility: Supine to Sit     Supine to sit: Mod assist, HOB elevated, Used rails     General bed mobility comments: vc's for technique; assist for trunk    Transfers Overall transfer level: Needs assistance Equipment  used: Rolling walker (2 wheels) Transfers: Sit to/from Stand, Bed to chair/wheelchair/BSC Sit to Stand: Max assist, +2 safety/equipment, From elevated surface Stand pivot transfers: Mod assist, Max assist, +2 physical assistance         General transfer comment: vc's for technique; assist to initiate stand and control descent sitting; pt pivoted on L LE (unable to take any "hops" on L LE with cueing)    Ambulation/Gait               General Gait Details: pt unable to "hop" with RW use   Stairs             Wheelchair Mobility     Tilt Bed    Modified Rankin (Stroke Patients Only)       Balance Overall balance assessment: Needs assistance Sitting-balance support: No upper extremity supported, Feet supported Sitting balance-Leahy Scale: Good Sitting balance - Comments: steady reaching within BOS   Standing balance support: Bilateral upper extremity supported Standing balance-Leahy Scale: Fair Standing balance comment: steady static standing with B UE support on RW                            Cognition Arousal: Alert Behavior During Therapy: Flat affect Overall Cognitive Status: Within Functional Limits for tasks assessed                                 General Comments: slower processing; increased time for activities        Exercises General Exercises - Lower  Extremity Long Arc Quad: AROM, Strengthening, Left, 10 reps, Seated Hip Flexion/Marching: AROM, Left, AAROM, Right, Strengthening, Both, 10 reps, Seated    General Comments General comments (skin integrity, edema, etc.): dressing intact R LE residual limb.  Nursing cleared pt for participation in physical therapy.  Pt agreeable to PT session.      Pertinent Vitals/Pain Pain Assessment Pain Assessment: 0-10 Pain Score: 8  Pain Location: R LE residual limb Pain Descriptors / Indicators: Discomfort, Aching, Throbbing Pain Intervention(s): Limited activity within  patient's tolerance, Monitored during session, Repositioned, Other (comment) (RN notified) Vitals (HR and SpO2 on room air) stable and WFL throughout treatment session.    Home Living                          Prior Function            PT Goals (current goals can now be found in the care plan section) Acute Rehab PT Goals Patient Stated Goal: to improve mobility PT Goal Formulation: With patient Time For Goal Achievement: 10/05/22 Potential to Achieve Goals: Good Progress towards PT goals: Progressing toward goals    Frequency    7X/week      PT Plan      Co-evaluation              AM-PAC PT "6 Clicks" Mobility   Outcome Measure  Help needed turning from your back to your side while in a flat bed without using bedrails?: A Lot Help needed moving from lying on your back to sitting on the side of a flat bed without using bedrails?: A Lot Help needed moving to and from a bed to a chair (including a wheelchair)?: A Lot Help needed standing up from a chair using your arms (e.g., wheelchair or bedside chair)?: Total Help needed to walk in hospital room?: Total Help needed climbing 3-5 steps with a railing? : Total 6 Click Score: 9    End of Session Equipment Utilized During Treatment: Gait belt Activity Tolerance: Patient limited by pain;Patient limited by fatigue Patient left: in chair;with call bell/phone within reach;with chair alarm set Nurse Communication: Mobility status;Precautions;Other (comment) (pt's pain status) PT Visit Diagnosis: Other abnormalities of gait and mobility (R26.89);Muscle weakness (generalized) (M62.81);Pain;History of falling (Z91.81) Pain - Right/Left: Right Pain - part of body: Leg     Time: 0912-0935 PT Time Calculation (min) (ACUTE ONLY): 23 min  Charges:    $Therapeutic Activity: 23-37 mins PT General Charges $$ ACUTE PT VISIT: 1 Visit                     Hendricks Limes, PT 09/23/22, 12:41 PM

## 2022-09-23 NOTE — Progress Notes (Signed)
                                                     Palliative Care Progress Note   Patient Name: REGINALDO BRINTON       Date: 09/23/2022 DOB: [birth year]  Age: 42 y.o. MRN#: [id number] Attending Physician: Sunnie Nielsen, DO Primary Care Physician: [name], NP Admit Date: 09/12/2022  Chart reviewed.  No acute palliative needs at this time.  Goals are clear.  Full code and full scope remain.  TOC following closely for discharge planning.  I counseled with attending Dr. Lyn Hollingshead who was in agreement for PMT to sign off.   Please reconsult if goals change, at patient/family's request, or if patient's health deteriorates during hospitalization.  Thank you for allowing the Palliative Medicine Team to assist in the care of Darrek L Joelene Millin.  Samara Deist L. Manon Hilding, FNP-BC Palliative Medicine Team Team Phone # [phone]  No charge

## 2022-09-23 NOTE — [place] FL2 (Signed)
[place] MEDICAID FL2 LEVEL OF CARE FORM     IDENTIFICATION  Patient Name: Martin Mullen Birthdate: 1980-08-27 Sex: male Admission Date (Current Location): 09/12/2022  [facility] and [place] Number:  Chiropodist and Address:  [facility], [address] [place], [place] [address]      Provider Number: [id number]  Attending Physician Name and Address:  Sunnie Nielsen, DO  Relative Name and Phone Number:       Current Level of Care: Hospital Recommended Level of Care: Skilled Nursing Facility Prior Approval Number:    Date Approved/Denied:   PASRR Number: [id number]  Discharge Plan: SNF    Current Diagnoses: Patient Active Problem List   Diagnosis Date Noted   Gangrene of right foot (HCC) 09/21/2022   Gangrene (HCC) 09/19/2022   Arterial occlusion 09/13/2022   Acute lower limb ischemia 09/12/2022   PAD (peripheral artery disease) (HCC) 06/27/2022   Chronic anticoagulation 06/26/2022   Diabetic foot ulcer associated with type 2 diabetes mellitus (HCC) 06/26/2022   Overweight with body mass index (BMI) of 27 to 27.9 in adult 10/08/2020   Recurrent strokes (HCC) 08/27/2020   GERD (gastroesophageal reflux disease) 07/20/2019   CAD S/P percutaneous coronary angioplasty    Uncontrolled type 2 diabetes mellitus with hyperglycemia, with long-term current use of insulin (HCC) 07/09/2013   HTN (hypertension) 09/05/2012   Hyperlipidemia associated with type 2 diabetes mellitus (HCC) 09/05/2012   Adjustment disorder with mixed anxiety and depressed mood 09/05/2012    Orientation RESPIRATION BLADDER Height & Weight     Self, Time, Situation, Place  Normal Incontinent Weight: 169 lb 1.5 oz (76.7 kg) Height:  5\' 11"  (180.3 cm)  BEHAVIORAL SYMPTOMS/MOOD NEUROLOGICAL BOWEL NUTRITION STATUS  Other (Comment) (Flat affect, cooperative.)  (None) Continent Diet (Carb modified)  AMBULATORY STATUS COMMUNICATION OF NEEDS Skin   Extensive Assist  Verbally Surgical wounds (Incision on right leg from AKA on 8/6: ABD, Xeroform, fluffs, Kerlix, Ace wrap.)                       Personal Care Assistance Level of Assistance  Bathing, Feeding, Dressing Bathing Assistance: Maximum assistance Feeding assistance: Limited assistance Dressing Assistance: Maximum assistance     Functional Limitations Info  Sight, Hearing, Speech Sight Info: Adequate Hearing Info: Adequate Speech Info: Adequate    SPECIAL CARE FACTORS FREQUENCY  PT (By licensed PT), OT (By licensed OT)     PT Frequency: 5 x week OT Frequency: 5 x week            Contractures Contractures Info: Not present    Additional Factors Info  Code Status, Allergies Code Status Info: Full code Allergies Info: NKDA           Current Medications (09/23/2022):  This is the current hospital active medication list Current Facility-Administered Medications  Medication Dose Route Frequency Provider Last Rate Last Admin   0.9 %  sodium chloride infusion  250 mL Intravenous PRN Schnier, Latina Craver, MD 10 mL/hr at 09/17/22 1548 250 mL at 09/17/22 1548   0.9 %  sodium chloride infusion   Intravenous Continuous Schnier, Latina Craver, MD 75 mL/hr at 09/23/22 1021 Infusion Verify at 09/23/22 1021   acetaminophen (TYLENOL) tablet 650 mg  650 mg Oral Q6H PRN Renford Dills, MD   650 mg at 09/20/22 2309   apixaban (ELIQUIS) tablet 5 mg  5 mg Oral BID Mila Merry A, RPH   5 mg at 09/23/22 1016  aspirin EC tablet 81 mg  81 mg Oral Daily Schnier, Latina Craver, MD   81 mg at 09/23/22 1016   hydrALAZINE (APRESOLINE) injection 5 mg  5 mg Intravenous Q20 Min PRN Schnier, Latina Craver, MD       HYDROmorphone (DILAUDID) injection 1 mg  1 mg Intravenous Q2H PRN Schnier, Latina Craver, MD   1 mg at 09/23/22 0757   insulin aspart (novoLOG) injection 0-15 Units  0-15 Units Subcutaneous TID WC Schnier, Latina Craver, MD   3 Units at 09/23/22 0925   labetalol (NORMODYNE) injection 10 mg  10 mg Intravenous  Q10 min PRN Schnier, Latina Craver, MD   10 mg at 09/17/22 1552   midazolam (VERSED) injection 1 mg  1 mg Intravenous Q1H PRN Schnier, Latina Craver, MD       nicotine (NICODERM CQ - dosed in mg/24 hours) patch 14 mg  14 mg Transdermal Daily Sunnie Nielsen, DO   14 mg at 09/23/22 1017   ondansetron (ZOFRAN) injection 4 mg  4 mg Intravenous Q6H PRN Schnier, Latina Craver, MD   4 mg at 09/16/22 1610   Oral care mouth rinse  15 mL Mouth Rinse PRN Sunnie Nielsen, DO       oxyCODONE (Oxy IR/ROXICODONE) immediate release tablet 5-10 mg  5-10 mg Oral Q4H PRN Schnier, Latina Craver, MD   10 mg at 09/22/22 1921   polyethylene glycol (MIRALAX / GLYCOLAX) packet 17 g  17 g Oral Daily Schnier, Latina Craver, MD   17 g at 09/23/22 1015   senna-docusate (Senokot-S) tablet 2 tablet  2 tablet Oral BID Schnier, Latina Craver, MD   2 tablet at 09/23/22 1016   sodium chloride flush (NS) 0.9 % injection 3 mL  3 mL Intravenous Q12H Schnier, Latina Craver, MD   3 mL at 09/23/22 1020   sodium chloride flush (NS) 0.9 % injection 3 mL  3 mL Intravenous PRN Schnier, Latina Craver, MD         Discharge Medications: Please see discharge summary for a list of discharge medications.  Relevant Imaging Results:  Relevant Lab Results:   Additional Information SS#: [id number]  Margarito Liner, LCSW

## 2022-09-23 NOTE — Plan of Care (Signed)
  Problem: Nutritional: Goal: Maintenance of adequate nutrition will improve Outcome: Progressing   Problem: Skin Integrity: Goal: Risk for impaired skin integrity will decrease Outcome: Progressing   Problem: Tissue Perfusion: Goal: Adequacy of tissue perfusion will improve Outcome: Progressing   Problem: Elimination: Goal: Will not experience complications related to bowel motility Outcome: Progressing Goal: Will not experience complications related to urinary retention Outcome: Progressing   Problem: Safety: Goal: Ability to remain free from injury will improve Outcome: Progressing   Problem: Skin Integrity: Goal: Risk for impaired skin integrity will decrease Outcome: Progressing

## 2022-09-24 DIAGNOSIS — I998 Other disorder of circulatory system: Secondary | ICD-10-CM | POA: Diagnosis not present

## 2022-09-24 LAB — GLUCOSE, CAPILLARY
Glucose-Capillary: 169 mg/dL — ABNORMAL HIGH (ref 70–99)
Glucose-Capillary: 302 mg/dL — ABNORMAL HIGH (ref 70–99)
Glucose-Capillary: 186 mg/dL — ABNORMAL HIGH (ref 70–99)
Glucose-Capillary: 177 mg/dL — ABNORMAL HIGH (ref 70–99)

## 2022-09-24 NOTE — Progress Notes (Signed)
PROGRESS NOTE    SHYON SLAGTER   YQM:[id number] DOB: 1980-04-02  DOA: 09/12/2022 Date of Service: 09/24/22 PCP: [name], NP     Brief Narrative / Hospital Course:  MAXIM SOSNOSKI is a 42 y.o. 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 He noted that he had not been taking his Eliquis for approximately 3 months. When asked by admitting hospitalist him why he discontinued, he stated he no longer wanted to take it but did not have any particular reason for that.  07/29: to ED, Vascular surgery was consulted and patient started on IV heparin. [facility] contacted for admission.  07/30: S/p angio and stenting on the right foot.  Status post thrombectomy.  Had a abrupt change in his pulse exam last night suggestive of foot ischemia.  Likely failure of Aggrastat per vascular 07/31: Overnight he had absent pulses from the knee down on Doppler.  Foot was becoming cyanotic and cold. Plan for repeat angio and thrombolysis with continuous tPA infusion today 08/01: Angio and stenting of the right LE, mechanical thrombectomy 08/02: Likely AKA early next week once family decides over the weekend 08/03: Being treated for suspected sepsis with IV antibiotics.  Increased oxycodone dose for better pain control 08/04: Patient mentally clear and agreeable for AKA 08/05: Plan for AKA on 8/6 per vascular surgery 08/06: Right above-the-knee amputation  08/07-08/08: stable, pending evaluation for CIR 08/09-08/10: CIR will not take him d/t social/living situation, TOC working on SNF, expect will be here thru the weekend    Consultants:  Vascular Surgery  Infectious disease  Hematology/Oncology  Procedures: 07/30: Angio and stenting of the right LE, mechanical thrombectomy 08/01: Repeat Angio and stenting of the right LE, mechanical thrombectomy 08/06: Right above-the-knee amputation       ASSESSMENT & PLAN:   Principal Problem:   Acute lower limb ischemia Active Problems:   Diabetic foot ulcer associated with type 2 diabetes mellitus (HCC)   Recurrent strokes (HCC)   Uncontrolled type 2 diabetes mellitus with hyperglycemia, with long-term current use of insulin (HCC)   HTN (hypertension)   CAD S/P percutaneous coronary angioplasty   Arterial occlusion   Gangrene (HCC)  Acute right lower limb ischemia:  Likely secondary to noncompliance w/ eliquis.  Eliquis  Oxycodone, morphine prn for pain.  Continue w/ neurovascular checks  s/p R AKA, vascular following    Sepsis likely due to diabetic foot ulcer/infection:  Fever, tachycardia, hypotension on 8/2 likely due to diabetic foot ulcer/gangrene Diabetic foot ulcer involving the second right toe that has been since amputated. 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 Has been on chronic Eliquis, but self discontinued 3 months ago.  Continue Eliquis  Seen by oncology.  Recommends discharge on Eliquis and antiplatelet    DM2 poorly controlled, HbA1c 7.7.  Continue on SSI w/ accuchecks    Hx of CAD:  s/p percutaneous coronary angioplasty.  Has not been taking eliquis at home.  Off heparin, back to Eliquis Starting  ARB, may add BB if BP tolerates    HTN:  Starting ARB, may add BB if BP tolerates    Goals of care conversation  Palliative care seen.   Full code     DVT prophylaxis: eliquis  Pertinent IV  fluids/nutrition: no continuous IV fluids, carb diet  Central lines / invasive devices: none  Code Status: FULL CODE  ACP documentation reviewed: 09/21/22 advanced directive on file, [name] is HCPOA  Current Admission Status: inpatient   TOC needs / Dispo plan: expect will need CIR or SNF rehab  Barriers to discharge / significant pending items: pending rehab assessment w/ CIR             Subjective / Brief ROS:  Patient reports no concerns today Resting comfortably in bed Denies CP/SOB.  Pain controlled  Tolerating diet.    Family Communication: aunt and uncle are at bedside on rounds     Objective Findings:  Vitals:   09/23/22 0829 09/23/22 1935 09/24/22 0342 09/24/22 0827  BP: (!) 164/94 (!) 140/81 133/84 122/82  Pulse: 95 92 89 81  Resp: 18 18 18 17   Temp: 98.3 F (36.8 C) 98.7 F (37.1 C) 98.4 F (36.9 C) 97.8 F (36.6 C)  TempSrc: Oral Oral Oral Oral  SpO2: 100% 97% 96% 96%  Weight:      Height:        Intake/Output Summary (Last 24 hours) at 09/24/2022 1219 Last data filed at 09/24/2022 1610 Gross per 24 hour  Intake 240 ml  Output 700 ml  Net -460 ml   Filed Weights   09/14/22 1324 09/18/22 0500 09/20/22 1107  Weight: 86.2 kg 76.7 kg 76.7 kg    Examination:  Physical Exam Constitutional:      General: He is not in acute distress. Cardiovascular:     Rate and Rhythm: Normal rate and regular rhythm.  Pulmonary:     Effort: Pulmonary effort is normal.     Breath sounds: Normal breath sounds.  Abdominal:     General: Abdomen is flat.     Palpations: Abdomen is soft.  Musculoskeletal:     Left lower leg: No edema.  Skin:    General: Skin is warm and dry.  Neurological:     Mental Status: He is alert. Mental status is at baseline.  Psychiatric:        Mood and Affect: Mood normal.        Behavior: Behavior normal.          Scheduled Medications:   apixaban  5 mg Oral BID   aspirin EC  81 mg Oral Daily   insulin  aspart  0-15 Units Subcutaneous TID WC   losartan  25 mg Oral QHS   nicotine  14 mg Transdermal Daily   polyethylene glycol  17 g Oral Daily   senna-docusate  2 tablet Oral BID   sodium chloride flush  3 mL Intravenous Q12H    Continuous Infusions:  sodium chloride 250 mL (09/17/22 1548)    PRN Medications:  sodium chloride, acetaminophen, HYDROmorphone (DILAUDID) injection, midazolam, ondansetron (ZOFRAN) IV, mouth rinse, oxyCODONE, sodium chloride flush  Antimicrobials from admission:  Anti-infectives (From admission, onward)    Start     Dose/Rate Route Frequency Ordered Stop   09/17/22 0100  vancomycin (VANCOREADY) IVPB 1500 mg/300 mL        1,500 mg 150 mL/hr over 120 Minutes Intravenous Every 12 hours 09/16/22 1735 09/22/22 1821   09/17/22 0000  vancomycin (  VANCOREADY) IVPB 1750 mg/350 mL  Status:  Discontinued        1,750 mg 175 mL/hr over 120 Minutes Intravenous Every 12 hours 09/16/22 1643 09/16/22 1735   09/16/22 1700  ceFEPIme (MAXIPIME) 2 g in sodium chloride 0.9 % 100 mL IVPB  Status:  Discontinued        2 g 200 mL/hr over 30 Minutes Intravenous  Once 09/16/22 1602 09/16/22 1608   09/16/22 1700  metroNIDAZOLE (FLAGYL) IVPB 500 mg        500 mg 100 mL/hr over 60 Minutes Intravenous Every 12 hours 09/16/22 1602 09/22/22 2024   09/16/22 1700  ceFEPIme (MAXIPIME) 2 g in sodium chloride 0.9 % 100 mL IVPB  Status:  Discontinued        2 g 200 mL/hr over 30 Minutes Intravenous Every 8 hours 09/16/22 1608 09/16/22 1616   09/16/22 1615  ceFEPIme (MAXIPIME) 2 g in sodium chloride 0.9 % 100 mL IVPB        2 g 200 mL/hr over 30 Minutes Intravenous Every 8 hours 09/16/22 1616 09/23/22 0559   09/16/22 1430  vancomycin (VANCOCIN) IVPB 1000 mg/200 mL premix  Status:  Discontinued        1,000 mg 200 mL/hr over 60 Minutes Intravenous Every 24 hours 09/16/22 1333 09/16/22 1643   09/15/22 1030  ceFAZolin (ANCEF) IVPB 2g/100 mL premix        2 g 200 mL/hr over 30 Minutes  Intravenous On call to O.R. 09/15/22 1028 09/15/22 1115   09/14/22 1012  ceFAZolin (ANCEF) IVPB 2g/100 mL premix        2 g 200 mL/hr over 30 Minutes Intravenous 30 min pre-op 09/14/22 1012 09/14/22 1053   09/13/22 1347  ceFAZolin (ANCEF) IVPB 2g/100 mL premix        2 g 200 mL/hr over 30 Minutes Intravenous 30 min pre-op 09/13/22 1347 09/13/22 1450           Data Reviewed:  I have personally reviewed the following...  CBC: Recent Labs  Lab 09/18/22 0408 09/19/22 0442 09/20/22 0429 09/21/22 0503  WBC 8.0 11.7* 13.9* 10.7*  HGB 11.4* 12.3* 11.3* 10.9*  HCT 32.0* 34.7* 32.3* 32.5*  MCV 87.0 85.9 88.0 91.3  PLT 152 200 195 252   Basic Metabolic Panel: Recent Labs  Lab 09/18/22 0408 09/19/22 0442 09/20/22 0429 09/21/22 0503 09/23/22 0423  NA 131* 131* 132* 136  --   K 3.5 3.8 3.9 3.7  --   CL 99 97* 100 106  --   CO2 24 24 24 24   --   GLUCOSE 171* 248* 235* 176*  --   BUN 7 7 7 8   --   CREATININE 0.75 0.78 0.69 0.67 0.57*  CALCIUM 7.8* 8.4* 8.1* 8.2*  --    GFR: Estimated Creatinine Clearance: 128.1 mL/min (A) (by C-G formula based on SCr of 0.57 mg/dL (L)). Liver Function Tests: No results for input(s): "AST", "ALT", "ALKPHOS", "BILITOT", "PROT", "ALBUMIN" in the last 168 hours.  No results for input(s): "LIPASE", "AMYLASE" in the last 168 hours. No results for input(s): "AMMONIA" in the last 168 hours. Coagulation Profile: No results for input(s): "INR", "PROTIME" in the last 168 hours.  Cardiac Enzymes: No results for input(s): "CKTOTAL", "CKMB", "CKMBINDEX", "TROPONINI" in the last 168 hours. BNP (last 3 results) No results for input(s): "PROBNP" in the last 8760 hours. HbA1C: No results for input(s): "HGBA1C" in the last 72 hours. CBG: Recent Labs  Lab 09/23/22 0828 09/23/22 1902  09/23/22 2111 09/24/22 0825 09/24/22 1158  GLUCAP 182* 252* 239* 169* 302*   Lipid Profile: No results for input(s): "CHOL", "HDL", "LDLCALC", "TRIG", "CHOLHDL",  "LDLDIRECT" in the last 72 hours. Thyroid Function Tests: No results for input(s): "TSH", "T4TOTAL", "FREET4", "T3FREE", "THYROIDAB" in the last 72 hours. Anemia Panel: No results for input(s): "VITAMINB12", "FOLATE", "FERRITIN", "TIBC", "IRON", "RETICCTPCT" in the last 72 hours. Most Recent Urinalysis On File:     Component Value Date/Time   COLORURINE YELLOW (A) 09/16/2022 1654   APPEARANCEUR HAZY (A) 09/16/2022 1654   LABSPEC 1.017 09/16/2022 1654   PHURINE 5.0 09/16/2022 1654   GLUCOSEU NEGATIVE 09/16/2022 1654   HGBUR MODERATE (A) 09/16/2022 1654   BILIRUBINUR NEGATIVE 09/16/2022 1654   KETONESUR 20 (A) 09/16/2022 1654   PROTEINUR 30 (A) 09/16/2022 1654   NITRITE NEGATIVE 09/16/2022 1654   LEUKOCYTESUR NEGATIVE 09/16/2022 1654   Sepsis Labs: @LABRCNTIP (procalcitonin:4,lacticidven:4) Microbiology: Recent Results (from the past 240 hour(s))  Culture, blood (Routine X 2) w Reflex to ID Panel     Status: None   Collection Time: 09/16/22  4:19 PM   Specimen: BLOOD  Result Value Ref Range Status   Specimen Description BLOOD RIGHT [facility]  Final   Special Requests   Final    BOTTLES DRAWN AEROBIC AND ANAEROBIC Blood Culture adequate volume   Culture   Final    NO GROWTH 5 DAYS Performed at [facility], [address] [place], [place] [address]    Report Status 09/21/2022 FINAL  Final  Culture, blood (Routine X 2) w Reflex to ID Panel     Status: None   Collection Time: 09/16/22  4:24 PM   Specimen: BLOOD RIGHT HAND  Result Value Ref Range Status   Specimen Description BLOOD RIGHT HAND  Final   Special Requests   Final    BOTTLES DRAWN AEROBIC AND ANAEROBIC Blood Culture adequate volume   Culture   Final    NO GROWTH 5 DAYS Performed at [facility], [address] [place], [place] [address]    Report Status 09/21/2022 FINAL  Final  Aerobic Culture w Gram Stain (superficial specimen)     Status: Abnormal   Collection Time: 09/16/22  7:35 PM   Specimen:  Wound  Result Value Ref Range Status   Specimen Description   Final    WOUND Performed at [facility] [address] [place], [place] [address]    Special Requests   Final    RIGHT FOREARM Performed at [facility], [address] [place], [place] [address]    Gram Stain   Final    RARE SQUAMOUS EPITHELIAL CELLS PRESENT ABUNDANT GRAM NEGATIVE RODS FEW GRAM POSITIVE COCCI IN PAIRS Performed at [facility], [address], [place], [place] [address]    Culture MULTIPLE ORGANISMS PRESENT, NONE PREDOMINANT (A)  Final   Report Status 09/19/2022 FINAL  Final      Radiology Studies last 3 days: No results found.           LOS: 12 days      Sunnie Nielsen, DO [facility] 09/24/2022, 12:19 PM    Dictation software may have been used to generate the above note. Typos may occur and escape review in typed/dictated notes. Please contact Dr Lyn Hollingshead directly for clarity if needed.  Staff may message me via secure chat in Epic  but this may not receive an immediate response,  please page me for urgent matters!  If 7PM-7AM, please  contact night coverage [email or web address]

## 2022-09-24 NOTE — Progress Notes (Signed)
Physical Therapy Treatment Patient Details Name: CHRISTOFHER EASTMAN MRN: [id number] DOB: 1980-06-02 Today's Date: 09/24/2022   History of Present Illness Shuayb L Castner is a 42 y.o. 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 He is s/p several thrombectomies and limb salvage attempts, but progressed to requiring AKA which was performed 09/20/22    PT Comments  Patient alert, agrees to PT. States he feels like Sh%t today. Pain in R limb. He requires max +2 assist for bed mobility.  He is able to stand with min +2 assist from mildly elevated surface. Needs min +2/Mod +2 for stand pivot transfer to recliner. He will continue to benefit from skilled PT to improve independence with mobility.    If plan is discharge home, recommend the following: A lot of help with bathing/dressing/bathroom;Help with stairs or ramp for entrance;Assist for transportation;Assistance with cooking/housework;Two people to help with walking and/or transfers   Can travel by private vehicle     No  Equipment Recommendations  Wheelchair (measurements PT);Wheelchair cushion (measurements PT)    Recommendations for Other Services       Precautions / Restrictions Precautions Precautions: Fall Restrictions Weight Bearing Restrictions: Yes RLE Weight Bearing: Non weight bearing     Mobility  Bed Mobility Overal bed mobility: Needs Assistance Bed Mobility: Supine to Sit     Supine to sit: Max assist, +2 for physical assistance, Used rails, HOB elevated     General bed mobility comments: vc's for technique; assist for trunk and to bring LEs over EOB. Needs a lot of assist with bed mobility    Transfers Overall transfer  level: Needs assistance Equipment used: Rolling walker (2 wheels) Transfers: Sit to/from Stand Sit to Stand: Mod assist, +2 physical assistance, From elevated surface Stand pivot transfers: Min assist, +2 safety/equipment, Mod assist         General transfer comment: Cues for technique/safety. Assist needed to manage walker during pivoting.    Ambulation/Gait               General Gait Details: pt unable to hop with RW use   Stairs             Wheelchair Mobility     Tilt Bed    Modified Rankin (Stroke Patients Only)       Balance Overall balance assessment: Needs assistance Sitting-balance support: Feet supported Sitting balance-Leahy Scale: Fair     Standing balance support: Bilateral upper extremity supported, During functional activity, Reliant on assistive device for balance   Standing balance comment: steady static standing with B UE support on RW                            Cognition Arousal: Alert Behavior During Therapy: Flat affect Overall Cognitive Status: Within Functional Limits for tasks assessed                                 General Comments: slower processing; increased time for activities        Exercises      General Comments        Pertinent Vitals/Pain Pain Assessment Pain Assessment: Faces Faces Pain  Scale: Hurts little more Pain Location: R LE residual limb Pain Descriptors / Indicators: Discomfort, Sore Pain Intervention(s): Monitored during session, Repositioned    Home Living                          Prior Function            PT Goals (current goals can now be found in the care plan section) Acute Rehab PT Goals Patient Stated Goal: to improve mobility PT Goal Formulation: With patient Time For Goal Achievement: 10/05/22 Potential to Achieve Goals: Good Progress towards PT goals: Progressing toward goals    Frequency    Min 1X/week      PT Plan       Co-evaluation              AM-PAC PT "6 Clicks" Mobility   Outcome Measure  Help needed turning from your back to your side while in a flat bed without using bedrails?: A Lot Help needed moving from lying on your back to sitting on the side of a flat bed without using bedrails?: A Lot Help needed moving to and from a bed to a chair (including a wheelchair)?: A Lot Help needed standing up from a chair using your arms (e.g., wheelchair or bedside chair)?: A Lot Help needed to walk in hospital room?: Total Help needed climbing 3-5 steps with a railing? : Total 6 Click Score: 10    End of Session Equipment Utilized During Treatment: Gait belt Activity Tolerance: Patient limited by pain;Patient limited by fatigue Patient left: in chair;with call bell/phone within reach;with chair alarm set Nurse Communication: Mobility status PT Visit Diagnosis: Other abnormalities of gait and mobility (R26.89);Muscle weakness (generalized) (M62.81);Pain;History of falling (Z91.81) Pain - Right/Left: Right Pain - part of body: Leg     Time: 0922-0934 PT Time Calculation (min) (ACUTE ONLY): 12 min  Charges:    $Therapeutic Activity: 8-22 mins PT General Charges $$ ACUTE PT VISIT: 1 Visit                      , PT, [facility] 09/24/22,9:46 AM

## 2022-09-24 NOTE — Progress Notes (Signed)
4 Days Post-Op   Subjective/Chief Complaint: Without complaint. Up in chair eating breakfast   Objective: Vital signs in last 24 hours: Temp:  [97.8 F (36.6 C)-98.7 F (37.1 C)] 97.8 F (36.6 C) (08/10 0827) Pulse Rate:  [81-92] 81 (08/10 0827) Resp:  [17-18] 17 (08/10 0827) BP: (122-140)/(81-84) 122/82 (08/10 0827) SpO2:  [96 %-97 %] 96 % (08/10 0827) Last BM Date : 09/23/22  Intake/Output from previous day: 08/09 0701 - 08/10 0700 In: 1727.7 [P.O.:720; I.V.:907.7; IV Piggyback:100] Out: 700 [Urine:700] Intake/Output this shift: No intake/output data recorded.  General appearance: no distress Extremities: RIGHT AKA dressing- C/D/I, thigh soft  Lab Results:  No results for input(s): "WBC", "HGB", "HCT", "PLT" in the last 72 hours. BMET Recent Labs    09/23/22 0423  CREATININE 0.57*   PT/INR No results for input(s): "LABPROT", "INR" in the last 72 hours. ABG No results for input(s): "PHART", "HCO3" in the last 72 hours.  Invalid input(s): "PCO2", "PO2"  Studies/Results: No results found.  Anti-infectives: Anti-infectives (From admission, onward)    Start     Dose/Rate Route Frequency Ordered Stop   09/17/22 0100  vancomycin (VANCOREADY) IVPB 1500 mg/300 mL        1,500 mg 150 mL/hr over 120 Minutes Intravenous Every 12 hours 09/16/22 1735 09/22/22 1821   09/17/22 0000  vancomycin (VANCOREADY) IVPB 1750 mg/350 mL  Status:  Discontinued        1,750 mg 175 mL/hr over 120 Minutes Intravenous Every 12 hours 09/16/22 1643 09/16/22 1735   09/16/22 1700  ceFEPIme (MAXIPIME) 2 g in sodium chloride 0.9 % 100 mL IVPB  Status:  Discontinued        2 g 200 mL/hr over 30 Minutes Intravenous  Once 09/16/22 1602 09/16/22 1608   09/16/22 1700  metroNIDAZOLE (FLAGYL) IVPB 500 mg        500 mg 100 mL/hr over 60 Minutes Intravenous Every 12 hours 09/16/22 1602 09/22/22 2024   09/16/22 1700  ceFEPIme (MAXIPIME) 2 g in sodium chloride 0.9 % 100 mL IVPB  Status:  Discontinued         2 g 200 mL/hr over 30 Minutes Intravenous Every 8 hours 09/16/22 1608 09/16/22 1616   09/16/22 1615  ceFEPIme (MAXIPIME) 2 g in sodium chloride 0.9 % 100 mL IVPB        2 g 200 mL/hr over 30 Minutes Intravenous Every 8 hours 09/16/22 1616 09/23/22 0559   09/16/22 1430  vancomycin (VANCOCIN) IVPB 1000 mg/200 mL premix  Status:  Discontinued        1,000 mg 200 mL/hr over 60 Minutes Intravenous Every 24 hours 09/16/22 1333 09/16/22 1643   09/15/22 1030  ceFAZolin (ANCEF) IVPB 2g/100 mL premix        2 g 200 mL/hr over 30 Minutes Intravenous On call to O.R. 09/15/22 1028 09/15/22 1115   09/14/22 1012  ceFAZolin (ANCEF) IVPB 2g/100 mL premix        2 g 200 mL/hr over 30 Minutes Intravenous 30 min pre-op 09/14/22 1012 09/14/22 1053   09/13/22 1347  ceFAZolin (ANCEF) IVPB 2g/100 mL premix        2 g 200 mL/hr over 30 Minutes Intravenous 30 min pre-op 09/13/22 1347 09/13/22 1450       Assessment/Plan: s/p Procedure(s): AMPUTATION ABOVE KNEE (Right) POD #4  OK for discharge to rehab from Vascular standpoint and when otherwise clinically stable Daily dressing changes to RIGHT AKA stump  Follow Up in 2-3 weeks in office  LOS: 12 days    Eli Hose A 09/24/2022

## 2022-09-24 NOTE — Plan of Care (Signed)
  Problem: Education: Goal: Ability to describe self-care measures that may prevent or decrease complications (Diabetes Survival Skills Education) will improve Outcome: Progressing Goal: Individualized Educational Video(s) Outcome: Progressing   Problem: Coping: Goal: Ability to adjust to condition or change in health will improve Outcome: Progressing   Problem: Fluid Volume: Goal: Ability to maintain a balanced intake and output will improve Outcome: Progressing   Problem: Health Behavior/Discharge Planning: Goal: Ability to identify and utilize available resources and services will improve Outcome: Progressing Goal: Ability to manage health-related needs will improve Outcome: Progressing   Problem: Metabolic: Goal: Ability to maintain appropriate glucose levels will improve Outcome: Progressing   Problem: Nutritional: Goal: Maintenance of adequate nutrition will improve Outcome: Progressing Goal: Progress toward achieving an optimal weight will improve Outcome: Progressing   Problem: Skin Integrity: Goal: Risk for impaired skin integrity will decrease Outcome: Progressing   Problem: Tissue Perfusion: Goal: Adequacy of tissue perfusion will improve Outcome: Progressing   Problem: Education: Goal: Knowledge of General Education information will improve Description: Including pain rating scale, medication(s)/side effects and non-pharmacologic comfort measures Outcome: Progressing   Problem: Health Behavior/Discharge Planning: Goal: Ability to manage health-related needs will improve Outcome: Progressing   Problem: Clinical Measurements: Goal: Ability to maintain clinical measurements within normal limits will improve Outcome: Progressing Goal: Will remain free from infection Outcome: Progressing Goal: Diagnostic test results will improve Outcome: Progressing Goal: Respiratory complications will improve Outcome: Progressing Goal: Cardiovascular complication will  be avoided Outcome: Progressing   Problem: Activity: Goal: Risk for activity intolerance will decrease Outcome: Progressing   Problem: Nutrition: Goal: Adequate nutrition will be maintained Outcome: Progressing   Problem: Coping: Goal: Level of anxiety will decrease Outcome: Progressing   Problem: Elimination: Goal: Will not experience complications related to bowel motility Outcome: Progressing Goal: Will not experience complications related to urinary retention Outcome: Progressing   Problem: Pain Managment: Goal: General experience of comfort will improve Outcome: Progressing   Problem: Safety: Goal: Ability to remain free from injury will improve Outcome: Progressing   Problem: Skin Integrity: Goal: Risk for impaired skin integrity will decrease Outcome: Progressing   Problem: Education: Goal: Understanding of CV disease, CV risk reduction, and recovery process will improve Outcome: Progressing Goal: Individualized Educational Video(s) Outcome: Progressing   Problem: Activity: Goal: Ability to return to baseline activity level will improve Outcome: Progressing   Problem: Cardiovascular: Goal: Ability to achieve and maintain adequate cardiovascular perfusion will improve Outcome: Progressing Goal: Vascular access site(s) Level 0-1 will be maintained Outcome: Progressing   Problem: Health Behavior/Discharge Planning: Goal: Ability to safely manage health-related needs after discharge will improve Outcome: Progressing   Problem: Fluid Volume: Goal: Hemodynamic stability will improve Outcome: Progressing   Problem: Clinical Measurements: Goal: Diagnostic test results will improve Outcome: Progressing Goal: Signs and symptoms of infection will decrease Outcome: Progressing   Problem: Respiratory: Goal: Ability to maintain adequate ventilation will improve Outcome: Progressing   

## 2022-09-25 DIAGNOSIS — I998 Other disorder of circulatory system: Secondary | ICD-10-CM | POA: Diagnosis not present

## 2022-09-25 LAB — GLUCOSE, CAPILLARY
Glucose-Capillary: 163 mg/dL — ABNORMAL HIGH (ref 70–99)
Glucose-Capillary: 283 mg/dL — ABNORMAL HIGH (ref 70–99)
Glucose-Capillary: 182 mg/dL — ABNORMAL HIGH (ref 70–99)
Glucose-Capillary: 178 mg/dL — ABNORMAL HIGH (ref 70–99)

## 2022-09-25 NOTE — Progress Notes (Signed)
PT Cancellation Note  Patient Details Name: Martin Mullen MRN: [id number] DOB: Feb 25, 1980   Cancelled Treatment:    Reason Eval/Treat Not Completed: Patient declined, no reason specified Pt refusing participation in therapy or any mobility at this time. PT will continue to f/u with pt acutely as available and appropriate.   Alessandra Bevels  09/25/2022, 9:51 AM

## 2022-09-25 NOTE — Plan of Care (Signed)
  Problem: Education: Goal: Ability to describe self-care measures that may prevent or decrease complications (Diabetes Survival Skills Education) will improve Outcome: Progressing Goal: Individualized Educational Video(s) Outcome: Progressing   Problem: Coping: Goal: Ability to adjust to condition or change in health will improve Outcome: Progressing   Problem: Fluid Volume: Goal: Ability to maintain a balanced intake and output will improve Outcome: Progressing   Problem: Health Behavior/Discharge Planning: Goal: Ability to identify and utilize available resources and services will improve Outcome: Progressing Goal: Ability to manage health-related needs will improve Outcome: Progressing   Problem: Metabolic: Goal: Ability to maintain appropriate glucose levels will improve Outcome: Progressing   Problem: Nutritional: Goal: Maintenance of adequate nutrition will improve Outcome: Progressing Goal: Progress toward achieving an optimal weight will improve Outcome: Progressing   Problem: Skin Integrity: Goal: Risk for impaired skin integrity will decrease Outcome: Progressing   Problem: Tissue Perfusion: Goal: Adequacy of tissue perfusion will improve Outcome: Progressing   Problem: Education: Goal: Knowledge of General Education information will improve Description: Including pain rating scale, medication(s)/side effects and non-pharmacologic comfort measures Outcome: Progressing   Problem: Health Behavior/Discharge Planning: Goal: Ability to manage health-related needs will improve Outcome: Progressing   Problem: Clinical Measurements: Goal: Ability to maintain clinical measurements within normal limits will improve Outcome: Progressing Goal: Will remain free from infection Outcome: Progressing Goal: Diagnostic test results will improve Outcome: Progressing Goal: Respiratory complications will improve Outcome: Progressing Goal: Cardiovascular complication will  be avoided Outcome: Progressing   Problem: Activity: Goal: Risk for activity intolerance will decrease Outcome: Progressing   Problem: Nutrition: Goal: Adequate nutrition will be maintained Outcome: Progressing   Problem: Coping: Goal: Level of anxiety will decrease Outcome: Progressing   Problem: Elimination: Goal: Will not experience complications related to bowel motility Outcome: Progressing Goal: Will not experience complications related to urinary retention Outcome: Progressing   Problem: Pain Managment: Goal: General experience of comfort will improve Outcome: Progressing   Problem: Safety: Goal: Ability to remain free from injury will improve Outcome: Progressing   Problem: Skin Integrity: Goal: Risk for impaired skin integrity will decrease Outcome: Progressing   Problem: Education: Goal: Understanding of CV disease, CV risk reduction, and recovery process will improve Outcome: Progressing Goal: Individualized Educational Video(s) Outcome: Progressing   Problem: Activity: Goal: Ability to return to baseline activity level will improve Outcome: Progressing   Problem: Cardiovascular: Goal: Ability to achieve and maintain adequate cardiovascular perfusion will improve Outcome: Progressing Goal: Vascular access site(s) Level 0-1 will be maintained Outcome: Progressing   Problem: Health Behavior/Discharge Planning: Goal: Ability to safely manage health-related needs after discharge will improve Outcome: Progressing   Problem: Fluid Volume: Goal: Hemodynamic stability will improve Outcome: Progressing   Problem: Clinical Measurements: Goal: Diagnostic test results will improve Outcome: Progressing Goal: Signs and symptoms of infection will decrease Outcome: Progressing   Problem: Respiratory: Goal: Ability to maintain adequate ventilation will improve Outcome: Progressing   

## 2022-09-25 NOTE — Progress Notes (Signed)
PROGRESS NOTE    Martin Mullen   IHK:[id number] DOB: 1980/02/24  DOA: 09/12/2022 Date of Service: 09/25/22 PCP: [name], NP     Brief Narrative / Hospital Course:  Martin Mullen is a 41 y.o. 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 He noted that he had not been taking his Eliquis for approximately 3 months. When asked by admitting hospitalist him why he discontinued, he stated he no longer wanted to take it but did not have any particular reason for that.  07/29: to ED, Vascular surgery was consulted and patient started on IV heparin. [facility] contacted for admission.  07/30: S/p angio and stenting on the right foot.  Status post thrombectomy.  Had a abrupt change in his pulse exam last night suggestive of foot ischemia.  Likely failure of Aggrastat per vascular 07/31: Overnight he had absent pulses from the knee down on Doppler.  Foot was becoming cyanotic and cold. Plan for repeat angio and thrombolysis with continuous tPA infusion today 08/01: Angio and stenting of the right LE, mechanical thrombectomy 08/02: Likely AKA early next week once family decides over the weekend 08/03: Being treated for suspected sepsis with IV antibiotics.  Increased oxycodone dose for better pain control 08/04: Patient mentally clear and agreeable for AKA 08/05: Plan for AKA on 8/6 per vascular surgery 08/06: Right above-the-knee amputation  08/07-08/08: stable, pending evaluation for CIR 08/09-08/11: CIR will not take him d/t social/living situation, TOC working on SNF, here thru the weekend   Consultants:   Vascular Surgery  Infectious disease  Hematology/Oncology  Procedures: 07/30: Angio and stenting of the right LE, mechanical thrombectomy 08/01: Repeat Angio and stenting of the right LE, mechanical thrombectomy 08/06: Right above-the-knee amputation       ASSESSMENT & PLAN:   Principal Problem:   Acute lower limb ischemia Active Problems:   Diabetic foot ulcer associated with type 2 diabetes mellitus (HCC)   Recurrent strokes (HCC)   Uncontrolled type 2 diabetes mellitus with hyperglycemia, with long-term current use of insulin (HCC)   HTN (hypertension)   CAD S/P percutaneous coronary angioplasty   Arterial occlusion   Gangrene (HCC)  Acute right lower limb ischemia:  Likely secondary to noncompliance w/ eliquis.  Eliquis  Oxycodone, morphine prn for pain.  Continue w/ neurovascular checks  s/p R AKA, vascular following    Sepsis likely due to diabetic foot ulcer/infection:  Fever, tachycardia, hypotension on 8/2 likely due to diabetic foot ulcer/gangrene Diabetic foot ulcer involving the second right toe that has been since amputated. 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 Has been on chronic Eliquis, but self discontinued 3 months ago.  Continue Eliquis  Seen by oncology.   discharge on Eliquis and antiplatelet    DM2 HbA1c 7.7.  Continue on SSI w/ accuchecks    Hx of CAD:  s/p percutaneous coronary angioplasty.  Has not been taking eliquis at home.  Off heparin, back to Eliquis Starting ARB, may add BB if  BP tolerates    HTN:  Starting ARB, may add BB if BP tolerates    Goals of care conversation  Palliative care seen.   Full code     DVT prophylaxis: eliquis  Pertinent IV fluids/nutrition: no continuous IV fluids, carb diet   Central lines / invasive devices: none  Code Status: FULL CODE  ACP documentation reviewed: 09/21/22 advanced directive on file, [name] is HCPOA  Current Admission Status: inpatient   TOC needs / Dispo plan: rehab placement  Barriers to discharge / significant pending items: pending rehab placement              Subjective / Brief ROS:  Patient reports no concerns today Resting comfortably in bed    Family Communication: none at this time     Objective Findings:  Vitals:   09/24/22 1653 09/24/22 1937 09/25/22 0428 09/25/22 0806  BP: 108/81 (!) 134/91 (!) 147/99 (!) 146/93  Pulse: 98 99 94 90  Resp: 18 20 20    Temp: 98.9 F (37.2 C) 98.4 F (36.9 C) 97.8 F (36.6 C) 98.4 F (36.9 C)  TempSrc: Oral Oral Oral Oral  SpO2: 97% 100% 98% 97%  Weight:      Height:        Intake/Output Summary (Last 24 hours) at 09/25/2022 1101 Last data filed at 09/25/2022 0600 Gross per 24 hour  Intake 360 ml  Output 900 ml  Net -540 ml   Filed Weights   09/14/22 1324 09/18/22 0500 09/20/22 1107  Weight: 86.2 kg 76.7 kg 76.7 kg    Examination:  Physical Exam Constitutional:      General: He is not in acute distress. Cardiovascular:     Rate and Rhythm: Normal rate and regular rhythm.  Pulmonary:     Effort: Pulmonary effort is normal.     Breath sounds: Normal breath sounds.  Abdominal:     General: Abdomen is flat.     Palpations: Abdomen is soft.  Musculoskeletal:     Left lower leg: No edema.  Skin:    General: Skin is warm and dry.  Neurological:     Mental Status: Mental status is at baseline.  Psychiatric:        Mood and Affect: Mood normal.        Behavior: Behavior normal.          Scheduled Medications:   apixaban  5 mg Oral BID   aspirin EC  81 mg Oral Daily   insulin aspart  0-15 Units Subcutaneous TID WC   losartan  25 mg Oral QHS   nicotine  14 mg Transdermal Daily   polyethylene glycol  17 g Oral Daily   senna-docusate  2  tablet Oral BID   sodium chloride flush  3 mL Intravenous Q12H    Continuous Infusions:  sodium chloride 250 mL (09/17/22 1548)    PRN Medications:  sodium chloride, acetaminophen, HYDROmorphone (DILAUDID) injection, midazolam, ondansetron (ZOFRAN) IV, mouth rinse, oxyCODONE, sodium chloride flush  Antimicrobials from admission:  Anti-infectives (From admission, onward)    Start     Dose/Rate Route Frequency Ordered Stop   09/17/22 0100  vancomycin (VANCOREADY) IVPB 1500 mg/300 mL        1,500 mg 150 mL/hr over 120 Minutes Intravenous Every 12 hours 09/16/22 1735 09/22/22 1821   09/17/22 0000  vancomycin (VANCOREADY) IVPB 1750 mg/350 mL  Status:  Discontinued        1,750 mg 175 mL/hr over 120 Minutes Intravenous Every  12 hours 09/16/22 1643 09/16/22 1735   09/16/22 1700  ceFEPIme (MAXIPIME) 2 g in sodium chloride 0.9 % 100 mL IVPB  Status:  Discontinued        2 g 200 mL/hr over 30 Minutes Intravenous  Once 09/16/22 1602 09/16/22 1608   09/16/22 1700  metroNIDAZOLE (FLAGYL) IVPB 500 mg        500 mg 100 mL/hr over 60 Minutes Intravenous Every 12 hours 09/16/22 1602 09/22/22 2024   09/16/22 1700  ceFEPIme (MAXIPIME) 2 g in sodium chloride 0.9 % 100 mL IVPB  Status:  Discontinued        2 g 200 mL/hr over 30 Minutes Intravenous Every 8 hours 09/16/22 1608 09/16/22 1616   09/16/22 1615  ceFEPIme (MAXIPIME) 2 g in sodium chloride 0.9 % 100 mL IVPB        2 g 200 mL/hr over 30 Minutes Intravenous Every 8 hours 09/16/22 1616 09/23/22 0559   09/16/22 1430  vancomycin (VANCOCIN) IVPB 1000 mg/200 mL premix  Status:  Discontinued        1,000 mg 200 mL/hr over 60 Minutes Intravenous Every 24 hours 09/16/22 1333 09/16/22 1643   09/15/22 1030  ceFAZolin (ANCEF) IVPB 2g/100 mL premix        2 g 200 mL/hr over 30 Minutes Intravenous On call to O.R. 09/15/22 1028 09/15/22 1115   09/14/22 1012  ceFAZolin (ANCEF) IVPB 2g/100 mL premix        2 g 200 mL/hr over 30 Minutes Intravenous 30 min  pre-op 09/14/22 1012 09/14/22 1053   09/13/22 1347  ceFAZolin (ANCEF) IVPB 2g/100 mL premix        2 g 200 mL/hr over 30 Minutes Intravenous 30 min pre-op 09/13/22 1347 09/13/22 1450           Data Reviewed:  I have personally reviewed the following...  CBC: Recent Labs  Lab 09/19/22 0442 09/20/22 0429 09/21/22 0503  WBC 11.7* 13.9* 10.7*  HGB 12.3* 11.3* 10.9*  HCT 34.7* 32.3* 32.5*  MCV 85.9 88.0 91.3  PLT 200 195 252   Basic Metabolic Panel: Recent Labs  Lab 09/19/22 0442 09/20/22 0429 09/21/22 0503 09/23/22 0423  NA 131* 132* 136  --   K 3.8 3.9 3.7  --   CL 97* 100 106  --   CO2 24 24 24   --   GLUCOSE 248* 235* 176*  --   BUN 7 7 8   --   CREATININE 0.78 0.69 0.67 0.57*  CALCIUM 8.4* 8.1* 8.2*  --    GFR: Estimated Creatinine Clearance: 128.1 mL/min (A) (by C-G formula based on SCr of 0.57 mg/dL (L)). Liver Function Tests: No results for input(s): "AST", "ALT", "ALKPHOS", "BILITOT", "PROT", "ALBUMIN" in the last 168 hours.  No results for input(s): "LIPASE", "AMYLASE" in the last 168 hours. No results for input(s): "AMMONIA" in the last 168 hours. Coagulation Profile: No results for input(s): "INR", "PROTIME" in the last 168 hours.  Cardiac Enzymes: No results for input(s): "CKTOTAL", "CKMB", "CKMBINDEX", "TROPONINI" in the last 168 hours. BNP (last 3 results) No results for input(s): "PROBNP" in the last 8760 hours. HbA1C: No results for input(s): "HGBA1C" in the last 72 hours. CBG: Recent Labs  Lab 09/24/22 0825 09/24/22 1158 09/24/22 1642 09/24/22 2115 09/25/22 0803  GLUCAP 169* 302* 186* 177* 163*   Lipid Profile: No results for input(s): "CHOL", "HDL", "LDLCALC", "TRIG", "CHOLHDL", "LDLDIRECT" in the last 72 hours. Thyroid Function Tests: No results for input(s): "TSH", "T4TOTAL", "FREET4", "  T3FREE", "THYROIDAB" in the last 72 hours. Anemia Panel: No results for input(s): "VITAMINB12", "FOLATE", "FERRITIN", "TIBC", "IRON",  "RETICCTPCT" in the last 72 hours. Most Recent Urinalysis On File:     Component Value Date/Time   COLORURINE YELLOW (A) 09/16/2022 1654   APPEARANCEUR HAZY (A) 09/16/2022 1654   LABSPEC 1.017 09/16/2022 1654   PHURINE 5.0 09/16/2022 1654   GLUCOSEU NEGATIVE 09/16/2022 1654   HGBUR MODERATE (A) 09/16/2022 1654   BILIRUBINUR NEGATIVE 09/16/2022 1654   KETONESUR 20 (A) 09/16/2022 1654   PROTEINUR 30 (A) 09/16/2022 1654   NITRITE NEGATIVE 09/16/2022 1654   LEUKOCYTESUR NEGATIVE 09/16/2022 1654   Sepsis Labs: @LABRCNTIP (procalcitonin:4,lacticidven:4) Microbiology: Recent Results (from the past 240 hour(s))  Culture, blood (Routine X 2) w Reflex to ID Panel     Status: None   Collection Time: 09/16/22  4:19 PM   Specimen: BLOOD  Result Value Ref Range Status   Specimen Description BLOOD RIGHT [facility]  Final   Special Requests   Final    BOTTLES DRAWN AEROBIC AND ANAEROBIC Blood Culture adequate volume   Culture   Final    NO GROWTH 5 DAYS Performed at [facility], [address] [place], [place] [address]    Report Status 09/21/2022 FINAL  Final  Culture, blood (Routine X 2) w Reflex to ID Panel     Status: None   Collection Time: 09/16/22  4:24 PM   Specimen: BLOOD RIGHT HAND  Result Value Ref Range Status   Specimen Description BLOOD RIGHT HAND  Final   Special Requests   Final    BOTTLES DRAWN AEROBIC AND ANAEROBIC Blood Culture adequate volume   Culture   Final    NO GROWTH 5 DAYS Performed at [facility], [address], [place], [place] [address]    Report Status 09/21/2022 FINAL  Final  Aerobic Culture w Gram Stain (superficial specimen)     Status: Abnormal   Collection Time: 09/16/22  7:35 PM   Specimen: Wound  Result Value Ref Range Status   Specimen Description   Final    WOUND Performed at [facility], [address], [place], [place] [address]    Special Requests   Final    RIGHT FOREARM Performed at [facility],  [address] [place], [place] [address]    Gram Stain   Final    RARE SQUAMOUS EPITHELIAL CELLS PRESENT ABUNDANT GRAM NEGATIVE RODS FEW GRAM POSITIVE COCCI IN PAIRS Performed at [facility], [address] [place], [place] [address]    Culture MULTIPLE ORGANISMS PRESENT, NONE PREDOMINANT (A)  Final   Report Status 09/19/2022 FINAL  Final      Radiology Studies last 3 days: No results found.           LOS: 13 days      Sunnie Nielsen, DO [facility] 09/25/2022, 11:01 AM    Dictation software may have been used to generate the above note. Typos may occur and escape review in typed/dictated notes. Please contact Dr Lyn Hollingshead directly for clarity if needed.  Staff may message me via secure chat in Epic  but this may not receive an immediate response,  please page me for urgent matters!  If 7PM-7AM, please contact night coverage [email or web address]

## 2022-09-25 NOTE — Progress Notes (Signed)
Mobility Specialist - Progress Note    09/25/22 1400  Mobility  Activity Stood at bedside;Dangled on edge of bed  Level of Assistance Minimal assist, patient does 75% or more  Assistive Device Front wheel walker  Range of Motion/Exercises Active  Activity Response Tolerated well  Mobility Referral Yes  $Mobility charge 1 Mobility  Mobility Specialist Start Time (ACUTE ONLY) 1340  Mobility Specialist Stop Time (ACUTE ONLY) 1400  Mobility Specialist Time Calculation (min) (ACUTE ONLY) 20 min   Pt resting in bed on RA upon entry. Pt STS in room with RW +2 assistance. Pt performed hygiene tasks MinA.Pt returned to bed and left with needs in reach. Bed alarm activated.   Martin Mullen Mobility Specialist 09/25/22, 2:04 PM

## 2022-09-26 DIAGNOSIS — I998 Other disorder of circulatory system: Secondary | ICD-10-CM | POA: Diagnosis not present

## 2022-09-26 LAB — GLUCOSE, CAPILLARY
Glucose-Capillary: 165 mg/dL — ABNORMAL HIGH (ref 70–99)
Glucose-Capillary: 204 mg/dL — ABNORMAL HIGH (ref 70–99)
Glucose-Capillary: 281 mg/dL — ABNORMAL HIGH (ref 70–99)
Glucose-Capillary: 242 mg/dL — ABNORMAL HIGH (ref 70–99)

## 2022-09-26 NOTE — Progress Notes (Signed)
Physical Therapy Treatment Patient Details Name: Martin Mullen MRN: [id number] DOB: 1980-08-21 Today's Date: 09/26/2022   History of Present Illness Martin Mullen is a 42 y.o. 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 He is s/p several thrombectomies and limb salvage attempts, but progressed to requiring AKA which was performed 09/20/22    PT Comments  Pt resting in bed upon PT arrival; agreeable to therapy; pt pre-medicated with pain meds for session (R LE residual limb pain at rest 4/10 beginning of session and 8/10 end of session at rest).  During session pt mod assist with bed mobility; mod assist squat pivot transfer bed to recliner towards R side; and max assist to stand up to RW (pt able to stand up to RW with 1 assist 5/6 trials).  Will continue to focus on strengthening and progressive functional mobility per pt tolerance.   If plan is discharge home, recommend the following: A lot of help with bathing/dressing/bathroom;Help with stairs or ramp for entrance;Assist for transportation;Assistance with cooking/housework;Two people to help with walking and/or transfers   Can travel by private vehicle     No  Equipment Recommendations  Wheelchair (measurements PT);Wheelchair cushion (measurements PT)    Recommendations for Other Services       Precautions / Restrictions Precautions Precautions: Fall Restrictions Weight Bearing Restrictions: Yes RLE Weight Bearing: Non weight bearing     Mobility  Bed Mobility Overal bed mobility: Needs Assistance Bed Mobility: Supine to Sit     Supine to sit: Mod assist, HOB elevated, Used rails     General bed mobility comments: vc's for  technique; assist for trunk    Transfers Overall transfer level: Needs assistance Equipment used: Rolling walker (2 wheels) Transfers: Sit to/from Stand, Bed to chair/wheelchair/BSC Sit to Stand: Mod assist, Max assist     Squat pivot transfers: Mod assist (bed to recliner towards R side)     General transfer comment: x6 trials standing from recliner (pt able to stand 5/6 trials with 1 assist and vc's for technique/UE positioning)    Ambulation/Gait Ambulation/Gait assistance: Mod assist Gait Distance (Feet):  (pt able to hop in place 1 time with max cueing for technique) Assistive device: Rolling walker (2 wheels)         General Gait Details: max vc's for technique; assist to steady   Stairs             Wheelchair Mobility     Tilt Bed    Modified Rankin (Stroke Patients Only)       Balance Overall balance assessment: Needs assistance Sitting-balance support: No upper extremity supported, Feet supported Sitting balance-Leahy Scale: Good Sitting balance - Comments: steady reaching within BOS   Standing balance support: Bilateral upper extremity supported, Reliant on assistive device for balance Standing balance-Leahy Scale: Fair Standing balance comment: steady static standing with B UE support on RW                            Cognition Arousal: Alert Behavior During Therapy: Flat affect Overall Cognitive Status: Within Functional Limits for tasks assessed  General Comments: slower processing; increased time for activities        Exercises      General Comments General comments (skin integrity, edema, etc.): dressing intact R LE residual limb.  Nursing cleared pt for participation in physical therapy.  Pt agreeable to PT session.      Pertinent Vitals/Pain Pain Assessment Pain Assessment: Faces Faces Pain Scale: Hurts whole lot Pain Location: R LE residual limb Pain Descriptors /  Indicators: Discomfort, Sore Pain Intervention(s): Limited activity within patient's tolerance, Monitored during session, Premedicated before session, Repositioned, Other (comment) (RN notified) HR 100 bpm at rest (HR increased 115 to 124 bpm with activity).    Home Living                          Prior Function            PT Goals (current goals can now be found in the care plan section) Acute Rehab PT Goals Patient Stated Goal: to improve mobility PT Goal Formulation: With patient Time For Goal Achievement: 10/05/22 Potential to Achieve Goals: Good Progress towards PT goals: Progressing toward goals    Frequency    Min 1X/week      PT Plan      Co-evaluation              AM-PAC PT "6 Clicks" Mobility   Outcome Measure  Help needed turning from your back to your side while in a flat bed without using bedrails?: A Lot Help needed moving from lying on your back to sitting on the side of a flat bed without using bedrails?: A Lot Help needed moving to and from a bed to a chair (including a wheelchair)?: A Lot Help needed standing up from a chair using your arms (e.g., wheelchair or bedside chair)?: A Lot Help needed to walk in hospital room?: Total Help needed climbing 3-5 steps with a railing? : Total 6 Click Score: 10    End of Session Equipment Utilized During Treatment: Gait belt Activity Tolerance: Patient limited by pain;Patient limited by fatigue Patient left: in chair;with call bell/phone within reach;with chair alarm set Nurse Communication: Mobility status;Precautions;Other (comment) (pt's pain status) PT Visit Diagnosis: Other abnormalities of gait and mobility (R26.89);Muscle weakness (generalized) (M62.81);Pain;History of falling (Z91.81) Pain - Right/Left: Right Pain - part of body: Leg     Time: 2952-8413 PT Time Calculation (min) (ACUTE ONLY): 23 min  Charges:    $Therapeutic Activity: 23-37 mins PT General Charges $$ ACUTE PT  VISIT: 1 Visit                     Hendricks Limes, PT 09/26/22, 12:06 PM

## 2022-09-26 NOTE — Progress Notes (Signed)
Progress Note    09/26/2022 9:47 AM 6 Days Post-Op  Subjective:  Martin Mullen is a 42 yo male who is now POD# 6 from right lower extremity above the knee amputation. Recovering as expected. No complaints and vitals remain stable. Looking for placement/rehab now.  CIR will not take him d/t social/living situation, TOC working on SNF, here until placement.  Vitals:   09/26/22 0349 09/26/22 0754  BP: 136/86 116/75  Pulse: 98 90  Resp: 18 16  Temp: 98.8 F (37.1 C) 98.2 F (36.8 C)  SpO2: 99% 96%   Physical Exam: Cardiac:  RRR, with tachycardic Rate, normal S1, S2.  No murmurs appreciated Lungs: Clear throughout on auscultation, no rales, rhonchi or wheezing noted. Incisions: Left groin incision with erythema.  Dressing applied, clean dry and intact.  No hematoma noted Extremities: Lower extremity is warm to touch with palpable pulses.  Right lower extremity below the knee cool to touch with mottled purple foot and toes.  Without Doppler pulses. Abdomen: Positive bowel sounds throughout, soft, nontender and nondistended. Neurologic: Alert and oriented to 2,name and place,  history of CVA x 3.  Patient unable to follow commands appropriately all the time  CBC    Component Value Date/Time   WBC 8.8 09/26/2022 0356   RBC 3.39 (L) 09/26/2022 0356   HGB 10.5 (L) 09/26/2022 0356   HGB 17.9 (H) 08/11/2021 0937   HCT 30.7 (L) 09/26/2022 0356   HCT 51.0 08/11/2021 0937   PLT 562 (H) 09/26/2022 0356   PLT 261 08/11/2021 0937   MCV 90.6 09/26/2022 0356   MCV 93 08/11/2021 0937   MCV 93 01/29/2013 0040   MCH 31.0 09/26/2022 0356   MCHC 34.2 09/26/2022 0356   RDW 13.4 09/26/2022 0356   RDW 12.4 08/11/2021 0937   RDW 13.8 01/29/2013 0040   LYMPHSABS 1.8 09/16/2022 1618   LYMPHSABS 4.8 (H) 08/11/2021 0937   MONOABS 0.7 09/16/2022 1618   EOSABS 0.1 09/16/2022 1618   EOSABS 0.5 (H) 08/11/2021 0937   BASOSABS 0.1 09/16/2022 1618   BASOSABS 0.1 08/11/2021 0937    BMET     Component Value Date/Time   NA 132 (L) 09/26/2022 0356   NA 140 08/11/2021 0937   NA 139 01/29/2013 0040   K 4.0 09/26/2022 0356   K 4.0 01/29/2013 0040   CL 96 (L) 09/26/2022 0356   CL 107 01/29/2013 0040   CO2 27 09/26/2022 0356   CO2 28 01/29/2013 0040   GLUCOSE 169 (H) 09/26/2022 0356   GLUCOSE 228 (H) 01/29/2013 0040   BUN 11 09/26/2022 0356   BUN 11 08/11/2021 0937   BUN 15 01/29/2013 0040   CREATININE 0.65 09/26/2022 0356   CREATININE 0.82 08/11/2022 1455   CALCIUM 8.6 (L) 09/26/2022 0356   CALCIUM 9.2 01/29/2013 0040   GFRNONAA >60 09/26/2022 0356   GFRNONAA >60 01/29/2013 0040   GFRAA 104 11/13/2019 1550   GFRAA >60 01/29/2013 0040    INR    Component Value Date/Time   INR 1.1 09/16/2022 1618     Intake/Output Summary (Last 24 hours) at 09/26/2022 0947 Last data filed at 09/26/2022 0100 Gross per 24 hour  Intake --  Output 1600 ml  Net -1600 ml     Assessment/Plan:  42 y.o. male is s/p Right above the knee amputation  6 Days Post-Op   PLAN: Continue PT/OT Pain Medications PRN Waiting on Placement.   DVT prophylaxis:  ASA 81 mg Daily.    Peggye Ley   [facility] 09/26/2022 9:47 AM

## 2022-09-26 NOTE — Progress Notes (Signed)
Occupational Therapy Treatment Patient Details Name: Martin Mullen MRN: [id number] DOB: 02-03-1981 Today's Date: 09/26/2022   History of present illness Martin Mullen is a 42 y.o. 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 Appearing alert; willing to work with OT on t/f to EOB for grooming. T/f MOD A; able to groom with set up and then work on scooting at EOB CGA towards [name] with multiple cues. See flowsheet below for further details of session. Left semi-reclined in bed with all needs in reach.  Patient will benefit from continued OT while in acute care.       If plan is discharge home, recommend the following:  Assist for transportation;Assistance with cooking/housework;Help with stairs or ramp for entrance;A lot of help with walking and/or transfers;A lot of help with bathing/dressing/bathroom   Equipment Recommendations  Other (comment) (defer)    Recommendations for Other Services      Precautions / Restrictions Precautions Precautions: Fall Restrictions Weight Bearing Restrictions: Yes RLE Weight Bearing: Non weight bearing       Mobility Bed Mobility Overal bed mobility: Needs Assistance Bed Mobility: Supine to Sit, Sit to Supine     Supine to sit: Mod assist, HOB elevated (assist with trunk; cues for how to move limbs) Sit to supine: Min assist   General bed mobility comments: cues, extra time, sequencing assist     Transfers Overall transfer level: Needs assistance Equipment used: None               General transfer comment: Pt performed scooting from end of bed towards HOB (approx 3 feet) with CGA and multiple cues for body positioning (L foot placement, BIL hand placement, trunk lean)     Balance Overall balance assessment: Needs assistance Sitting-balance support: No upper extremity supported, Feet supported Sitting balance-Leahy Scale: Good                                     ADL either performed or assessed with clinical judgement   ADL Overall ADL's : Needs assistance/impaired     Grooming: Set up;Sitting;Wash/dry face                                 General ADL Comments: Good sitting balance with L foot on the floor.    Extremity/Trunk Assessment Upper Extremity Assessment Upper Extremity Assessment: Overall WFL for tasks assessed   Lower Extremity Assessment Lower Extremity Assessment: Defer to PT evaluation        Vision       Perception     Praxis      Cognition Arousal: Alert Behavior During Therapy: Flat affect Overall Cognitive Status: Within Functional Limits for tasks assessed  General Comments: slower processing; increased time for activities        Exercises      Shoulder Instructions       General Comments dressing on R residual limb    Pertinent Vitals/ Pain       Pain Assessment Pain Assessment: 0-10 Pain Score: 8  Pain Location: R LE residual limb Pain Descriptors / Indicators: Discomfort, Sore Pain Intervention(s): Limited activity within patient's tolerance, Monitored during session, Premedicated before session  Home Living                                          Prior Functioning/Environment              Frequency  Min 1X/week        Progress Toward Goals  OT Goals(current goals can now be found in the care plan  section)  Progress towards OT goals: Progressing toward goals  Acute Rehab OT Goals Patient Stated Goal: Go home OT Goal Formulation: With patient Time For Goal Achievement: 10/05/22 Potential to Achieve Goals: Good ADL Goals Pt Will Perform Grooming: with set-up;with supervision;standing Pt Will Perform Lower Body Dressing: sit to/from stand;with supervision Pt Will Transfer to Toilet: with modified independence;ambulating;bedside commode  Plan      Co-evaluation                 AM-PAC OT "6 Clicks" Daily Activity     Outcome Measure   Help from another person eating meals?: None Help from another person taking care of personal grooming?: A Little Help from another person toileting, which includes using toliet, bedpan, or urinal?: A Lot Help from another person bathing (including washing, rinsing, drying)?: A Lot Help from another person to put on and taking off regular upper body clothing?: None Help from another person to put on and taking off regular lower body clothing?: A Lot 6 Click Score: 17    End of Session    OT Visit Diagnosis: Other abnormalities of gait and mobility (R26.89);Muscle weakness (generalized) (M62.81)   Activity Tolerance Patient tolerated treatment well   Patient Left in bed;with call bell/phone within reach;with bed alarm set   Nurse Communication Mobility status;Other (comment) (pt asking about his glasses; OT notified RN that OT can't find glasses in room.)        Time: 1314-1330 OT Time Calculation (min): 16 min  Charges: OT General Charges $OT Visit: 1 Visit OT Treatments $Self Care/Home Management : 8-22 mins  Linward Foster, MS, OTR/L  Alvester Morin 09/26/2022, 3:53 PM

## 2022-09-26 NOTE — TOC Progression Note (Signed)
Transition of Care [facility]) - Progression Note    Patient Details  Name: Martin Mullen MRN: [id number] Date of Birth: April 08, 1980  Transition [facility]) CM/SW Contact  Hetty Ely, RN Phone Number: 09/26/2022, 10:21 AM  Clinical Narrative:   [facility]. Declines patient, however [facility] accepts his Insurance, Referral faxed to [place] at [phone]..    Expected Discharge Plan: Skilled Nursing Facility Barriers to Discharge: Insurance Authorization, Continued Medical Work up, SNF Pending bed offer  Expected Discharge Plan and Services     Post Acute Care Choice: Skilled Nursing Facility Living arrangements for the past 2 months: Mobile Home                                       Social Determinants of Health (SDOH) Interventions SDOH Screenings   Food Insecurity: No Food Insecurity (09/22/2022)  Housing: Low Risk  (09/22/2022)  Transportation Needs: No Transportation Needs (09/22/2022)  Utilities: Not At Risk (09/22/2022)  Alcohol Screen: Low Risk  (07/01/2020)  Depression (PHQ2-9): High Risk (05/10/2022)  Financial Resource Strain: Low Risk  (06/10/2022)  Tobacco Use: High Risk (09/20/2022)    Readmission Risk Interventions     No data to display

## 2022-09-26 NOTE — TOC Progression Note (Signed)
Transition of Care [facility]) - Progression Note    Patient Details  Name: Martin Mullen MRN: [id number] Date of Birth: Jan 03, 1981  Transition [facility]) CM/SW Contact  Margarito Liner, LCSW Phone Number: 09/26/2022, 10:01 AM  Clinical Narrative: [facility] declined referral.  [facility] left a voicemail stating they do accept his insurance. Will ask TOC covering patient today to follow up with them.  Expected Discharge Plan: Skilled Nursing Facility Barriers to Discharge: English as a second language teacher, Continued Medical Work up, SNF Pending bed offer  Expected Discharge Plan and Services     Post Acute Care Choice: Skilled Nursing Facility Living arrangements for the past 2 months: Mobile Home                                       Social Determinants of Health (SDOH) Interventions SDOH Screenings   Food Insecurity: No Food Insecurity (09/22/2022)  Housing: Low Risk  (09/22/2022)  Transportation Needs: No Transportation Needs (09/22/2022)  Utilities: Not At Risk (09/22/2022)  Alcohol Screen: Low Risk  (07/01/2020)  Depression (PHQ2-9): High Risk (05/10/2022)  Financial Resource Strain: Low Risk  (06/10/2022)  Tobacco Use: High Risk (09/20/2022)    Readmission Risk Interventions     No data to display

## 2022-09-26 NOTE — Progress Notes (Signed)
PROGRESS NOTE    Martin Mullen   EXB:[id number] DOB: Mar 28, 1980  DOA: 09/12/2022 Date of Service: 09/26/22 PCP: [name], NP     Brief Narrative / Hospital Course:  Martin Mullen is a 42 y.o. 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 He noted that he had not been taking his Eliquis for approximately 3 months. When asked by admitting hospitalist him why he discontinued, he stated he no longer wanted to take it but did not have any particular reason for that.  07/29: to ED, Vascular surgery was consulted and patient started on IV heparin. [facility] contacted for admission.  07/30: S/p angio and stenting on the right foot.  Status post thrombectomy.  Had a abrupt change in his pulse exam last night suggestive of foot ischemia.  Likely failure of Aggrastat per vascular 07/31: Overnight he had absent pulses from the knee down on Doppler.  Foot was becoming cyanotic and cold. Plan for repeat angio and thrombolysis with continuous tPA infusion today 08/01: Angio and stenting of the right LE, mechanical thrombectomy 08/02: Likely AKA early next week once family decides over the weekend 08/03: Being treated for suspected sepsis with IV antibiotics.  Increased oxycodone dose for better pain control 08/04: Patient mentally clear and agreeable for AKA 08/05: Plan for AKA on 8/6 per vascular surgery 08/06: Right above-the-knee amputation  08/07-08/08: stable, pending evaluation for CIR 08/09-08/11: CIR will not take him d/t social/living situation, TOC working on SNF, here thru the weekend  08/12 (Mon): TOC  working on placement w/ [facility]   Consultants:  Vascular Surgery  Infectious disease  Hematology/Oncology  Procedures: 07/30: Angio and stenting of the right LE, mechanical thrombectomy 08/01: Repeat Angio and stenting of the right LE, mechanical thrombectomy 08/06: Right above-the-knee amputation       ASSESSMENT & PLAN:   Principal Problem:   Acute lower limb ischemia Active Problems:   Diabetic foot ulcer associated with type 2 diabetes mellitus (HCC)   Recurrent strokes (HCC)   Uncontrolled type 2 diabetes mellitus with hyperglycemia, with long-term current use of insulin (HCC)   HTN (hypertension)   CAD S/P percutaneous coronary angioplasty   Arterial occlusion   Gangrene (HCC)  Acute right lower limb ischemia:  Likely secondary to noncompliance w/ eliquis.  Eliquis  Oxycodone, morphine prn for pain.  Continue w/ neurovascular checks  s/p R AKA, vascular following    Sepsis likely due to diabetic foot ulcer/infection:  Fever, tachycardia, hypotension on 8/2 likely due to diabetic foot ulcer/gangrene Diabetic foot ulcer involving the second right toe that has been since amputated. 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 Has been on chronic Eliquis, but self discontinued 3 months ago.  Continue Eliquis  Seen by oncology.   discharge on Eliquis and antiplatelet    DM2 HbA1c 7.7.  Continue on SSI w/ accuchecks    Hx of CAD:  s/p percutaneous coronary angioplasty.  Has not been taking eliquis at home.  Off  heparin, back to Eliquis Starting ARB, may add BB if BP tolerates    HTN:  Starting ARB, may add BB if BP tolerates    Goals of care conversation  Palliative care seen.   Full code     DVT prophylaxis: eliquis  Pertinent IV  fluids/nutrition: no continuous IV fluids, carb diet  Central lines / invasive devices: none  Code Status: FULL CODE  ACP documentation reviewed: 09/21/22 advanced directive on file, [name] is HCPOA  Current Admission Status: inpatient   TOC needs / Dispo plan: rehab placement  Barriers to discharge / significant pending items: pending rehab placement              Subjective / Brief ROS:  Patient reports no concerns today Resting comfortably in bed    Family Communication: none at this time     Objective Findings:  Vitals:   09/25/22 1700 09/25/22 1956 09/26/22 0349 09/26/22 0754  BP: 137/80 120/80 136/86 116/75  Pulse: 89 100 98 90  Resp:  18 18 16   Temp: 98.5 F (36.9 C) 99.2 F (37.3 C) 98.8 F (37.1 C) 98.2 F (36.8 C)  TempSrc: Oral Oral Oral Oral  SpO2: 98% 99% 99% 96%  Weight:      Height:        Intake/Output Summary (Last 24 hours) at 09/26/2022 1256 Last data filed at 09/26/2022 1057 Gross per 24 hour  Intake 240 ml  Output 1600 ml  Net -1360 ml   Filed Weights   09/14/22 1324 09/18/22 0500 09/20/22 1107  Weight: 86.2 kg 76.7 kg 76.7 kg    Examination:  Physical Exam Constitutional:      General: He is not in acute distress. Cardiovascular:     Rate and Rhythm: Normal rate and regular rhythm.  Pulmonary:     Effort: Pulmonary effort is normal.     Breath sounds: Normal breath sounds.  Abdominal:     General: Abdomen is flat.     Palpations: Abdomen is soft.  Musculoskeletal:     Left lower leg: No edema.  Skin:    General: Skin is warm and dry.  Neurological:     Mental Status: Mental status is at baseline.  Psychiatric:        Mood and Affect: Mood normal.        Behavior: Behavior normal.          Scheduled Medications:   apixaban  5 mg Oral BID   aspirin EC  81 mg Oral Daily   insulin aspart  0-15 Units Subcutaneous TID WC   losartan  25 mg Oral QHS   nicotine  14 mg Transdermal Daily   polyethylene  glycol  17 g Oral Daily   senna-docusate  2 tablet Oral BID   sodium chloride flush  3 mL Intravenous Q12H    Continuous Infusions:  sodium chloride 250 mL (09/17/22 1548)    PRN Medications:  sodium chloride, acetaminophen, HYDROmorphone (DILAUDID) injection, midazolam, ondansetron (ZOFRAN) IV, mouth rinse, oxyCODONE, sodium chloride flush  Antimicrobials from admission:  Anti-infectives (From admission, onward)    Start     Dose/Rate Route Frequency Ordered Stop   09/17/22 0100  vancomycin (VANCOREADY) IVPB 1500 mg/300 mL        1,500 mg 150 mL/hr over 120 Minutes Intravenous Every 12 hours 09/16/22 1735 09/22/22 1821   09/17/22 0000  vancomycin (VANCOREADY) IVPB 1750 mg/350 mL  Status:  Discontinued        1,750 mg  175 mL/hr over 120 Minutes Intravenous Every 12 hours 09/16/22 1643 09/16/22 1735   09/16/22 1700  ceFEPIme (MAXIPIME) 2 g in sodium chloride 0.9 % 100 mL IVPB  Status:  Discontinued        2 g 200 mL/hr over 30 Minutes Intravenous  Once 09/16/22 1602 09/16/22 1608   09/16/22 1700  metroNIDAZOLE (FLAGYL) IVPB 500 mg        500 mg 100 mL/hr over 60 Minutes Intravenous Every 12 hours 09/16/22 1602 09/22/22 2024   09/16/22 1700  ceFEPIme (MAXIPIME) 2 g in sodium chloride 0.9 % 100 mL IVPB  Status:  Discontinued        2 g 200 mL/hr over 30 Minutes Intravenous Every 8 hours 09/16/22 1608 09/16/22 1616   09/16/22 1615  ceFEPIme (MAXIPIME) 2 g in sodium chloride 0.9 % 100 mL IVPB        2 g 200 mL/hr over 30 Minutes Intravenous Every 8 hours 09/16/22 1616 09/23/22 0559   09/16/22 1430  vancomycin (VANCOCIN) IVPB 1000 mg/200 mL premix  Status:  Discontinued        1,000 mg 200 mL/hr over 60 Minutes Intravenous Every 24 hours 09/16/22 1333 09/16/22 1643   09/15/22 1030  ceFAZolin (ANCEF) IVPB 2g/100 mL premix        2 g 200 mL/hr over 30 Minutes Intravenous On call to O.R. 09/15/22 1028 09/15/22 1115   09/14/22 1012  ceFAZolin (ANCEF) IVPB 2g/100 mL premix        2  g 200 mL/hr over 30 Minutes Intravenous 30 min pre-op 09/14/22 1012 09/14/22 1053   09/13/22 1347  ceFAZolin (ANCEF) IVPB 2g/100 mL premix        2 g 200 mL/hr over 30 Minutes Intravenous 30 min pre-op 09/13/22 1347 09/13/22 1450           Data Reviewed:  I have personally reviewed the following...  CBC: Recent Labs  Lab 09/20/22 0429 09/21/22 0503 09/26/22 0356  WBC 13.9* 10.7* 8.8  HGB 11.3* 10.9* 10.5*  HCT 32.3* 32.5* 30.7*  MCV 88.0 91.3 90.6  PLT 195 252 562*   Basic Metabolic Panel: Recent Labs  Lab 09/20/22 0429 09/21/22 0503 09/23/22 0423 09/26/22 0356  NA 132* 136  --  132*  K 3.9 3.7  --  4.0  CL 100 106  --  96*  CO2 24 24  --  27  GLUCOSE 235* 176*  --  169*  BUN 7 8  --  11  CREATININE 0.69 0.67 0.57* 0.65  CALCIUM 8.1* 8.2*  --  8.6*   GFR: Estimated Creatinine Clearance: 128.1 mL/min (by C-G formula based on SCr of 0.65 mg/dL). Liver Function Tests: No results for input(s): "AST", "ALT", "ALKPHOS", "BILITOT", "PROT", "ALBUMIN" in the last 168 hours.  No results for input(s): "LIPASE", "AMYLASE" in the last 168 hours. No results for input(s): "AMMONIA" in the last 168 hours. Coagulation Profile: No results for input(s): "INR", "PROTIME" in the last 168 hours.  Cardiac Enzymes: No results for input(s): "CKTOTAL", "CKMB", "CKMBINDEX", "TROPONINI" in the last 168 hours. BNP (last 3 results) No results for input(s): "PROBNP" in the last 8760 hours. HbA1C: No results for input(s): "HGBA1C" in the last 72 hours. CBG: Recent Labs  Lab 09/25/22 1215 09/25/22 1711 09/25/22 2128 09/26/22 0752 09/26/22 1145  GLUCAP 283* 182* 178* 165* 204*   Lipid Profile: No results for input(s): "CHOL", "HDL", "LDLCALC", "TRIG", "CHOLHDL", "LDLDIRECT" in the last 72 hours. Thyroid Function Tests: No results  for input(s): "TSH", "T4TOTAL", "FREET4", "T3FREE", "THYROIDAB" in the last 72 hours. Anemia Panel: No results for input(s): "VITAMINB12", "FOLATE",  "FERRITIN", "TIBC", "IRON", "RETICCTPCT" in the last 72 hours. Most Recent Urinalysis On File:     Component Value Date/Time   COLORURINE YELLOW (A) 09/16/2022 1654   APPEARANCEUR HAZY (A) 09/16/2022 1654   LABSPEC 1.017 09/16/2022 1654   PHURINE 5.0 09/16/2022 1654   GLUCOSEU NEGATIVE 09/16/2022 1654   HGBUR MODERATE (A) 09/16/2022 1654   BILIRUBINUR NEGATIVE 09/16/2022 1654   KETONESUR 20 (A) 09/16/2022 1654   PROTEINUR 30 (A) 09/16/2022 1654   NITRITE NEGATIVE 09/16/2022 1654   LEUKOCYTESUR NEGATIVE 09/16/2022 1654   Sepsis Labs: @LABRCNTIP (procalcitonin:4,lacticidven:4) Microbiology: Recent Results (from the past 240 hour(s))  Culture, blood (Routine X 2) w Reflex to ID Panel     Status: None   Collection Time: 09/16/22  4:19 PM   Specimen: BLOOD  Result Value Ref Range Status   Specimen Description BLOOD RIGHT [facility]  Final   Special Requests   Final    BOTTLES DRAWN AEROBIC AND ANAEROBIC Blood Culture adequate volume   Culture   Final    NO GROWTH 5 DAYS Performed at [facility], [address] [place], [place] [address]    Report Status 09/21/2022 FINAL  Final  Culture, blood (Routine X 2) w Reflex to ID Panel     Status: None   Collection Time: 09/16/22  4:24 PM   Specimen: BLOOD RIGHT HAND  Result Value Ref Range Status   Specimen Description BLOOD RIGHT HAND  Final   Special Requests   Final    BOTTLES DRAWN AEROBIC AND ANAEROBIC Blood Culture adequate volume   Culture   Final    NO GROWTH 5 DAYS Performed at [facility], [address], [place], [place] [address]    Report Status 09/21/2022 FINAL  Final  Aerobic Culture w Gram Stain (superficial specimen)     Status: Abnormal   Collection Time: 09/16/22  7:35 PM   Specimen: Wound  Result Value Ref Range Status   Specimen Description   Final    WOUND Performed at [facility], [address] [place], [place] [address]    Special Requests   Final    RIGHT FOREARM Performed  at [facility], [address] [place], [place] [address]    Gram Stain   Final    RARE SQUAMOUS EPITHELIAL CELLS PRESENT ABUNDANT GRAM NEGATIVE RODS FEW GRAM POSITIVE COCCI IN PAIRS Performed at [facility], [address] [place], [place] [address]    Culture MULTIPLE ORGANISMS PRESENT, NONE PREDOMINANT (A)  Final   Report Status 09/19/2022 FINAL  Final      Radiology Studies last 3 days: No results found.           LOS: 14 days      Sunnie Nielsen, DO [facility] 09/26/2022, 12:56 PM    Dictation software may have been used to generate the above note. Typos may occur and escape review in typed/dictated notes. Please contact Dr Lyn Hollingshead directly for clarity if needed.  Staff may message me via secure chat in Epic  but this may not receive an immediate response,  please page me for urgent matters!  If 7PM-7AM, please contact night coverage [email or web address]

## 2022-09-27 DIAGNOSIS — I998 Other disorder of circulatory system: Secondary | ICD-10-CM | POA: Diagnosis not present

## 2022-09-27 LAB — GLUCOSE, CAPILLARY
Glucose-Capillary: 187 mg/dL — ABNORMAL HIGH (ref 70–99)
Glucose-Capillary: 247 mg/dL — ABNORMAL HIGH (ref 70–99)
Glucose-Capillary: 170 mg/dL — ABNORMAL HIGH (ref 70–99)
Glucose-Capillary: 240 mg/dL — ABNORMAL HIGH (ref 70–99)

## 2022-09-27 MED ORDER — INSULIN GLARGINE-YFGN 100 UNIT/ML ~~LOC~~ SOLN
5.0000 [IU] | Freq: Every day | SUBCUTANEOUS | Status: DC
  Administered 2022-09-27: 5 [IU] via SUBCUTANEOUS
  Filled 2022-09-27 (×2): qty 0.05

## 2022-09-27 NOTE — Plan of Care (Signed)
  Problem: Education: Goal: Ability to describe self-care measures that may prevent or decrease complications (Diabetes Survival Skills Education) will improve Outcome: Progressing Goal: Individualized Educational Video(s) Outcome: Progressing   Problem: Coping: Goal: Ability to adjust to condition or change in health will improve Outcome: Progressing   Problem: Fluid Volume: Goal: Ability to maintain a balanced intake and output will improve Outcome: Progressing   Problem: Health Behavior/Discharge Planning: Goal: Ability to identify and utilize available resources and services will improve Outcome: Progressing Goal: Ability to manage health-related needs will improve Outcome: Progressing   Problem: Metabolic: Goal: Ability to maintain appropriate glucose levels will improve Outcome: Progressing   Problem: Nutritional: Goal: Maintenance of adequate nutrition will improve Outcome: Progressing Goal: Progress toward achieving an optimal weight will improve Outcome: Progressing   Problem: Skin Integrity: Goal: Risk for impaired skin integrity will decrease Outcome: Progressing   Problem: Tissue Perfusion: Goal: Adequacy of tissue perfusion will improve Outcome: Progressing   Problem: Education: Goal: Knowledge of General Education information will improve Description: Including pain rating scale, medication(s)/side effects and non-pharmacologic comfort measures Outcome: Progressing   Problem: Health Behavior/Discharge Planning: Goal: Ability to manage health-related needs will improve Outcome: Progressing   Problem: Clinical Measurements: Goal: Ability to maintain clinical measurements within normal limits will improve Outcome: Progressing Goal: Will remain free from infection Outcome: Progressing Goal: Diagnostic test results will improve Outcome: Progressing Goal: Respiratory complications will improve Outcome: Progressing Goal: Cardiovascular complication will  be avoided Outcome: Progressing   Problem: Activity: Goal: Risk for activity intolerance will decrease Outcome: Progressing   Problem: Nutrition: Goal: Adequate nutrition will be maintained Outcome: Progressing   Problem: Coping: Goal: Level of anxiety will decrease Outcome: Progressing   Problem: Elimination: Goal: Will not experience complications related to bowel motility Outcome: Progressing Goal: Will not experience complications related to urinary retention Outcome: Progressing   Problem: Pain Managment: Goal: General experience of comfort will improve Outcome: Progressing   Problem: Safety: Goal: Ability to remain free from injury will improve Outcome: Progressing   Problem: Skin Integrity: Goal: Risk for impaired skin integrity will decrease Outcome: Progressing   Problem: Education: Goal: Understanding of CV disease, CV risk reduction, and recovery process will improve Outcome: Progressing Goal: Individualized Educational Video(s) Outcome: Progressing   Problem: Activity: Goal: Ability to return to baseline activity level will improve Outcome: Progressing   Problem: Cardiovascular: Goal: Ability to achieve and maintain adequate cardiovascular perfusion will improve Outcome: Progressing Goal: Vascular access site(s) Level 0-1 will be maintained Outcome: Progressing   Problem: Health Behavior/Discharge Planning: Goal: Ability to safely manage health-related needs after discharge will improve Outcome: Progressing   Problem: Fluid Volume: Goal: Hemodynamic stability will improve Outcome: Progressing   Problem: Clinical Measurements: Goal: Diagnostic test results will improve Outcome: Progressing Goal: Signs and symptoms of infection will decrease Outcome: Progressing   Problem: Respiratory: Goal: Ability to maintain adequate ventilation will improve Outcome: Progressing   

## 2022-09-27 NOTE — Plan of Care (Signed)
  Problem: Nutritional: Goal: Maintenance of adequate nutrition will improve Outcome: Progressing Goal: Progress toward achieving an optimal weight will improve Outcome: Progressing   Problem: Elimination: Goal: Will not experience complications related to bowel motility Outcome: Progressing Goal: Will not experience complications related to urinary retention Outcome: Progressing   Problem: Pain Managment: Goal: General experience of comfort will improve Outcome: Progressing

## 2022-09-27 NOTE — TOC Progression Note (Signed)
Transition of Care [facility]) - Progression Note    Patient Details  Name: Martin Mullen MRN: [id number] Date of Birth: Sep 16, 1980  Transition of Care [facility]) CM/SW Contact  Chapman Fitch, RN Phone Number: 09/27/2022, 12:55 PM  Clinical Narrative:     Received return call from [facility] with [facility] inpatient.   Per Theodoro Grist they are in network with patients insurance.  Referral faxed to [phone]   Expected Discharge Plan: Skilled Nursing Facility Barriers to Discharge: Insurance Authorization, Continued Medical Work up, SNF Pending bed offer  Expected Discharge Plan and Services     Post Acute Care Choice: Skilled Nursing Facility Living arrangements for the past 2 months: Mobile Home                                       Social Determinants of Health (SDOH) Interventions SDOH Screenings   Food Insecurity: No Food Insecurity (09/22/2022)  Housing: Low Risk  (09/22/2022)  Transportation Needs: No Transportation Needs (09/22/2022)  Utilities: Not At Risk (09/22/2022)  Alcohol Screen: Low Risk  (07/01/2020)  Depression (PHQ2-9): High Risk (05/10/2022)  Financial Resource Strain: Low Risk  (06/10/2022)  Tobacco Use: High Risk (09/20/2022)    Readmission Risk Interventions     No data to display

## 2022-09-27 NOTE — Progress Notes (Signed)
Occupational Therapy Treatment Patient Details Name: Martin Mullen MRN: [id number] DOB: [birth year] Today's Date: 09/27/2022   History of present illness Kedar L Hollenback is a 42 y.o. 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 Upon arrival to room pt reclined in bed, agreeable to tx. Pt requires MIN A exit bed, MOD A don L sock in sitting. MIN A + RW sit<>stand x2 trials, assist to stabilize RW, decreasing to MOD A for eccentric control as pt fatigues. Tolerated ~3 hoping side steps along EOB with R knee buckling noted after ~3 min standing. Educated pt on importance of rest breaks and falls prevention strategies. Reviewed HEP for seated therex. Completed seated core exerciess 1 set x 20 reps each: modified sit ups and lateral oblique sit ups with single UE support as needed. Pt making good progress toward goals, will continue to follow POC. Discharge recommendation remains appropriate.       If plan is discharge home, recommend the following:  Assist for transportation;Assistance with cooking/housework;Help with stairs or ramp for entrance;A lot of help with walking and/or transfers;A lot of help with bathing/dressing/bathroom   Equipment Recommendations  Other (comment) (defer)    Recommendations for Other Services      Precautions / Restrictions Precautions Precautions: Fall Restrictions Weight Bearing Restrictions: Yes RLE Weight Bearing: Non weight bearing       Mobility Bed Mobility Overal bed mobility:  Needs Assistance Bed Mobility: Sit to Supine, Supine to Sit       Sit to supine: Min assist Sit to sidelying: Supervision      Transfers Overall transfer level: Needs assistance Equipment used: Rolling walker (2 wheels) Transfers: Sit to/from Stand Sit to Stand: Min assist           General transfer comment: x2 trials, assist to stabilize RW, decreasin to MOD A for eccentric control as pt fatigues     Balance Overall balance assessment: Needs assistance Sitting-balance support: No upper extremity supported, Feet supported Sitting balance-Leahy Scale: Good     Standing balance support: Bilateral upper extremity supported, Reliant on assistive device for balance Standing balance-Leahy Scale: Fair                             ADL either performed or assessed with clinical judgement   ADL Overall ADL's : Needs assistance/impaired                                       General ADL Comments: MOD A don L sock in sitting. MIN A + RW simulated BSC t/f.       Cognition Arousal: Alert Behavior During Therapy: Flat affect Overall Cognitive Status: Within Functional Limits for tasks assessed                                 General Comments: slower processing; pleasant  Exercises Other Exercises Other Exercises: reviewed HEP for seated therex. Completed seated core exerciess 1 set x 20 reps each: modified sit ups and lateral oblique sit ups with single UE support as needed            Pertinent Vitals/ Pain       Pain Assessment Pain Assessment: 0-10 Pain Score: 4  Pain Location: R LE residual limb Pain Descriptors / Indicators: Discomfort, Sore Pain Intervention(s): Limited activity within patient's tolerance, Repositioned   Frequency  Min 1X/week        Progress Toward Goals  OT Goals(current goals can now be found in the care plan section)  Progress towards OT goals: Progressing toward goals  Acute Rehab  OT Goals Patient Stated Goal: to walk OT Goal Formulation: With patient Time For Goal Achievement: 10/05/22 Potential to Achieve Goals: Good ADL Goals Pt Will Perform Grooming: with set-up;with supervision;standing Pt Will Perform Lower Body Dressing: sit to/from stand;with supervision Pt Will Transfer to Toilet: with modified independence;ambulating;bedside commode  Plan      Co-evaluation                 AM-PAC OT "6 Clicks" Daily Activity     Outcome Measure   Help from another person eating meals?: None Help from another person taking care of personal grooming?: A Little Help from another person toileting, which includes using toliet, bedpan, or urinal?: A Lot Help from another person bathing (including washing, rinsing, drying)?: A Lot Help from another person to put on and taking off regular upper body clothing?: None Help from another person to put on and taking off regular lower body clothing?: A Lot 6 Click Score: 17    End of Session Equipment Utilized During Treatment: Rolling walker (2 wheels);Gait belt  OT Visit Diagnosis: Other abnormalities of gait and mobility (R26.89);Muscle weakness (generalized) (M62.81)   Activity Tolerance Patient tolerated treatment well   Patient Left in bed;with call bell/phone within reach;with bed alarm set   Nurse Communication          Time: 2440-1027 OT Time Calculation (min): 23 min  Charges: OT General Charges $OT Visit: 1 Visit OT Treatments $Self Care/Home Management : 8-22 mins $Therapeutic Exercise: 8-22 mins  Kathie Dike, M.S. OTR/L  09/27/22, 3:53 PM  ascom [phone]

## 2022-09-27 NOTE — TOC Progression Note (Signed)
Transition of Care [facility]) - Progression Note    Patient Details  Name: Martin Mullen MRN: [id number] Date of Birth: 1980/09/04  Transition of Care [facility]) CM/SW Contact  Chapman Fitch, RN Phone Number: 09/27/2022, 12:11 PM  Clinical Narrative:       VM left for [facility] inpatient rehab to determine if they are in network with patient's insurance   Spoke with Brittney at [facility], she state they can offer for STR only.  She states that [facility] in [place] can offer a bed for STR then potentially transfer to LTC.  She confirms they are in network with patient's insurance  Offer presented to Patient.  He accepts bed at [facility]. He is in agreement for me to update POA, [name] .  [name] notified. She inquires if there is anyone that can provide assistance with disability paperwork.  Email sent to [employer] with Financial to see if this is something they can assist with.   Per MD and vascular patient state for auth to be started.  Brittney with [facility] notified to start auth   Expected Discharge Plan: Skilled Nursing Facility Barriers to Discharge: English as a second language teacher, Continued Medical Work up, SNF Pending bed offer  Expected Discharge Plan and Services     Post Acute Care Choice: Skilled Nursing Facility Living arrangements for the past 2 months: Mobile Home                                       Social Determinants of Health (SDOH) Interventions SDOH Screenings   Food Insecurity: No Food Insecurity (09/22/2022)  Housing: Low Risk  (09/22/2022)  Transportation Needs: No Transportation Needs (09/22/2022)  Utilities: Not At Risk (09/22/2022)  Alcohol Screen: Low Risk  (07/01/2020)  Depression (PHQ2-9): High Risk (05/10/2022)  Financial Resource Strain: Low Risk  (06/10/2022)  Tobacco Use: High Risk (09/20/2022)    Readmission Risk Interventions     No data to display

## 2022-09-27 NOTE — TOC Progression Note (Signed)
Transition of Care [facility]) - Progression Note    Patient Details  Name: Martin Mullen MRN: [id number] Date of Birth: 02-Jan-1981  Transition of Care [facility]) CM/SW Contact  Hetty Ely, RN Phone Number: 09/27/2022, 8:43 AM  Clinical Narrative: [facility]. Amy called to report that they were unable to offer bed for patient, MD and covering CM notified.      Expected Discharge Plan: Skilled Nursing Facility Barriers to Discharge: English as a second language teacher, Continued Medical Work up, SNF Pending bed offer  Expected Discharge Plan and Services     Post Acute Care Choice: Skilled Nursing Facility Living arrangements for the past 2 months: Mobile Home                                       Social Determinants of Health (SDOH) Interventions SDOH Screenings   Food Insecurity: No Food Insecurity (09/22/2022)  Housing: Low Risk  (09/22/2022)  Transportation Needs: No Transportation Needs (09/22/2022)  Utilities: Not At Risk (09/22/2022)  Alcohol Screen: Low Risk  (07/01/2020)  Depression (PHQ2-9): High Risk (05/10/2022)  Financial Resource Strain: Low Risk  (06/10/2022)  Tobacco Use: High Risk (09/20/2022)    Readmission Risk Interventions     No data to display

## 2022-09-27 NOTE — Progress Notes (Signed)
PROGRESS NOTE    WANDELL FETTERMAN   WUJ:[id number] DOB: 07/29/1980  DOA: 09/12/2022 Date of Service: 09/27/22 PCP: [name], NP     Brief Narrative / Hospital Course:  Martin Mullen is a 42 y.o. 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 He noted that he had not been taking his Eliquis for approximately 3 months. When asked by admitting hospitalist him why he discontinued, he stated he no longer wanted to take it but did not have any particular reason for that.  07/29: to ED, Vascular consulted, started on IV heparin. Admitted.  07/30: S/p angio and stenting on the right foot.  Status post thrombectomy.  07/31: Foot cyanotic and cold. Plan for repeat angio and thrombolysis with continuous tPA infusion today 08/01: Angio and stenting of the right LE, mechanical thrombectomy 08/02: Likely AKA early next week once family decides over the weekend 08/03: Being treated for suspected sepsis with IV antibiotics.   08/04: Patient mentally clear and agreeable for AKA 08/05: Plan for AKA on 8/6 per vascular surgery 08/06: Right above-the-knee amputation  08/07 - 08/08: stable, pending evaluation for CIR 08/09 (Fri) - 08/11: CIR will not take him d/t social/living situation, TOC working on SNF, here thru the weekend  08/12 (Mon) - 08/13: TOC working on placement  Consultants:  Vascular Surgery  Infectious disease  Hematology/Oncology  Procedures: 07/30: Angio and stenting of the right LE, mechanical thrombectomy 08/01: Repeat Angio and stenting of the right LE, mechanical thrombectomy 08/06: Right  above-the-knee amputation       ASSESSMENT & PLAN:   Principal Problem:   Acute lower limb ischemia Active Problems:   Diabetic foot ulcer associated with type 2 diabetes mellitus (HCC)   Recurrent strokes (HCC)   Uncontrolled type 2 diabetes mellitus with hyperglycemia, with long-term current use of insulin (HCC)   HTN (hypertension)   CAD S/P percutaneous coronary angioplasty   Arterial occlusion   Gangrene (HCC)  Acute right lower limb ischemia, s/p R AKA Likely secondary to noncompliance w/ eliquis.  Eliquis  Oxycodone prn pain, have d/c IV pain med  s/p R AKA, vascular following    Sepsis likely due to diabetic foot ulcer/infection: resolved  Fever, tachycardia, hypotension on 8/2 likely due to diabetic foot ulcer/gangrene Diabetic foot ulcer involving the second right toe that has been since amputated. 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 Has been on chronic Eliquis, but self discontinued 3 months ago.  Continue Eliquis  discharge on Eliquis and antiplatelet to follow w/ hemonc outpatient    DM2 HbA1c 7.7.  Continue on SSI w/ accuchecks    Hx of CAD:  Has not been taking eliquis at home.  Eliquis  Starting ARB, may add BB if BP tolerates    HTN:  Starting ARB, may add BB if BP tolerates    Goals of care conversation  Palliative care has consulted .   Full code     DVT prophylaxis: eliquis  Pertinent IV fluids/nutrition: no continuous IV fluids, carb diet  Central lines / invasive devices: none  Code Status: FULL CODE  ACP documentation reviewed: 09/21/22 advanced directive on file, [name] is HCPOA  Current Admission Status: inpatient   TOC needs / Dispo plan: rehab placement  Barriers to discharge / significant pending items: pending rehab placement               Subjective / Brief ROS:  Patient reports no concerns today other than can't find his glasses, has question why dilaudud was d/c Resting comfortably in bed    Family Communication: none at this time     Objective Findings:  Vitals:   09/26/22 1946 09/26/22 2216 09/27/22 0445 09/27/22 0819  BP: 114/75 (!) 150/84 137/88 131/87  Pulse: 96 89 95 100  Resp: 16  20 18   Temp: 98.8 F (37.1 C)  98.2 F (36.8 C) 97.6 F (36.4 C)  TempSrc:   Oral Oral  SpO2: 98%  100% 100%  Weight:      Height:        Intake/Output Summary (Last 24 hours) at 09/27/2022 1414 Last data filed at 09/27/2022 1049 Gross per 24 hour  Intake 480 ml  Output 1300 ml  Net -820 ml   Filed Weights   09/14/22 1324 09/18/22 0500 09/20/22 1107  Weight: 86.2 kg 76.7 kg 76.7 kg    Examination:  Physical Exam Constitutional:      General: He is not in acute distress. Cardiovascular:     Rate and Rhythm: Normal rate and regular rhythm.  Pulmonary:     Effort: Pulmonary effort is normal.     Breath sounds: Normal breath sounds.  Abdominal:     General: Abdomen is flat.     Palpations: Abdomen is soft.  Musculoskeletal:     Left lower leg: No edema.  Skin:    General: Skin is warm and dry.  Neurological:     Mental Status: Mental status is at baseline.  Psychiatric:        Mood and Affect: Mood normal.        Behavior: Behavior normal.          Scheduled Medications:   apixaban  5 mg Oral BID   aspirin EC  81 mg Oral Daily   insulin aspart  0-15 Units Subcutaneous TID WC   insulin glargine-yfgn  5 Units Subcutaneous QHS   losartan  25 mg Oral QHS   nicotine  14 mg Transdermal Daily   polyethylene glycol  17 g Oral Daily   senna-docusate  2 tablet Oral BID   sodium chloride flush  3 mL Intravenous Q12H    Continuous Infusions:  sodium chloride 250 mL (09/17/22 1548)    PRN Medications:  sodium chloride, acetaminophen, ondansetron (ZOFRAN) IV, mouth rinse,  oxyCODONE, sodium chloride flush  Antimicrobials from admission:  Anti-infectives (From admission, onward)    Start     Dose/Rate Route Frequency Ordered Stop   09/17/22 0100  vancomycin (VANCOREADY) IVPB 1500 mg/300 mL        1,500 mg 150 mL/hr over 120 Minutes Intravenous Every 12 hours 09/16/22 1735 09/22/22 1821   09/17/22 0000  vancomycin (VANCOREADY) IVPB 1750 mg/350 mL  Status:  Discontinued        1,750 mg 175 mL/hr over 120 Minutes Intravenous Every 12 hours 09/16/22 1643 09/16/22 1735   09/16/22 1700  ceFEPIme (MAXIPIME) 2 g in sodium chloride 0.9 % 100 mL IVPB  Status:  Discontinued        2 g  200 mL/hr over 30 Minutes Intravenous  Once 09/16/22 1602 09/16/22 1608   09/16/22 1700  metroNIDAZOLE (FLAGYL) IVPB 500 mg        500 mg 100 mL/hr over 60 Minutes Intravenous Every 12 hours 09/16/22 1602 09/22/22 2024   09/16/22 1700  ceFEPIme (MAXIPIME) 2 g in sodium chloride 0.9 % 100 mL IVPB  Status:  Discontinued        2 g 200 mL/hr over 30 Minutes Intravenous Every 8 hours 09/16/22 1608 09/16/22 1616   09/16/22 1615  ceFEPIme (MAXIPIME) 2 g in sodium chloride 0.9 % 100 mL IVPB        2 g 200 mL/hr over 30 Minutes Intravenous Every 8 hours 09/16/22 1616 09/23/22 0559   09/16/22 1430  vancomycin (VANCOCIN) IVPB 1000 mg/200 mL premix  Status:  Discontinued        1,000 mg 200 mL/hr over 60 Minutes Intravenous Every 24 hours 09/16/22 1333 09/16/22 1643   09/15/22 1030  ceFAZolin (ANCEF) IVPB 2g/100 mL premix        2 g 200 mL/hr over 30 Minutes Intravenous On call to O.R. 09/15/22 1028 09/15/22 1115   09/14/22 1012  ceFAZolin (ANCEF) IVPB 2g/100 mL premix        2 g 200 mL/hr over 30 Minutes Intravenous 30 min pre-op 09/14/22 1012 09/14/22 1053   09/13/22 1347  ceFAZolin (ANCEF) IVPB 2g/100 mL premix        2 g 200 mL/hr over 30 Minutes Intravenous 30 min pre-op 09/13/22 1347 09/13/22 1450           Data Reviewed:  I have personally reviewed the  following...  CBC: Recent Labs  Lab 09/21/22 0503 09/26/22 0356  WBC 10.7* 8.8  HGB 10.9* 10.5*  HCT 32.5* 30.7*  MCV 91.3 90.6  PLT 252 562*   Basic Metabolic Panel: Recent Labs  Lab 09/21/22 0503 09/23/22 0423 09/26/22 0356  NA 136  --  132*  K 3.7  --  4.0  CL 106  --  96*  CO2 24  --  27  GLUCOSE 176*  --  169*  BUN 8  --  11  CREATININE 0.67 0.57* 0.65  CALCIUM 8.2*  --  8.6*   GFR: Estimated Creatinine Clearance: 128.1 mL/min (by C-G formula based on SCr of 0.65 mg/dL). Liver Function Tests: No results for input(s): "AST", "ALT", "ALKPHOS", "BILITOT", "PROT", "ALBUMIN" in the last 168 hours.  No results for input(s): "LIPASE", "AMYLASE" in the last 168 hours. No results for input(s): "AMMONIA" in the last 168 hours. Coagulation Profile: No results for input(s): "INR", "PROTIME" in the last 168 hours.  Cardiac Enzymes: No results for input(s): "CKTOTAL", "CKMB", "CKMBINDEX", "TROPONINI" in the last 168 hours. BNP (last 3 results) No results for input(s): "PROBNP" in the last 8760 hours. HbA1C: No results for input(s): "HGBA1C" in the last 72 hours. CBG: Recent Labs  Lab 09/26/22 1145 09/26/22 1620 09/26/22 2152 09/27/22 0736 09/27/22 1116  GLUCAP 204* 281* 242* 187* 247*   Lipid Profile: No results for input(s): "CHOL", "HDL", "LDLCALC", "TRIG", "CHOLHDL", "LDLDIRECT" in the last 72 hours. Thyroid Function Tests: No results for input(s): "TSH", "T4TOTAL", "FREET4", "T3FREE", "THYROIDAB" in the last 72 hours. Anemia Panel: No results for input(s): "VITAMINB12", "FOLATE", "FERRITIN", "TIBC", "IRON", "RETICCTPCT" in the last 72 hours. Most Recent Urinalysis On File:     Component Value Date/Time   COLORURINE YELLOW (A) 09/16/2022 1654   APPEARANCEUR HAZY (A) 09/16/2022 1654   LABSPEC 1.017 09/16/2022  1654   PHURINE 5.0 09/16/2022 1654   GLUCOSEU NEGATIVE 09/16/2022 1654   HGBUR MODERATE (A) 09/16/2022 1654   BILIRUBINUR NEGATIVE 09/16/2022 1654    KETONESUR 20 (A) 09/16/2022 1654   PROTEINUR 30 (A) 09/16/2022 1654   NITRITE NEGATIVE 09/16/2022 1654   LEUKOCYTESUR NEGATIVE 09/16/2022 1654   Sepsis Labs: @LABRCNTIP (procalcitonin:4,lacticidven:4) Microbiology: No results found for this or any previous visit (from the past 240 hour(s)).     Radiology Studies last 3 days: No results found.           LOS: 15 days      Sunnie Nielsen, DO [facility] 09/27/2022, 2:14 PM    Dictation software may have been used to generate the above note. Typos may occur and escape review in typed/dictated notes. Please contact Dr Lyn Hollingshead directly for clarity if needed.  Staff may message me via secure chat in Epic  but this may not receive an immediate response,  please page me for urgent matters!  If 7PM-7AM, please contact night coverage [email or web address]

## 2022-09-27 NOTE — Progress Notes (Signed)
Physical Therapy Treatment Patient Details Name: Martin Mullen MRN: [id number] DOB: Sep 03, 1980 Today's Date: 09/27/2022   History of Present Illness Martin Mullen is a 42 y.o. 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  During session pt mod assist with squat pivot transfer recliner to bed and min assist to lay down in bed.  Performed LE ex's in sitting and supine in bed.  Deferred standing activities d/t R LE residual limb dressing starting to fall off (nurse notified of need to address R LE residual limb dressing).  Will continue to focus on strengthening and progressive functional mobility during hospitalization.   If plan is discharge home, recommend the following: A lot of help with bathing/dressing/bathroom;Help with stairs or ramp for entrance;Assist for transportation;Assistance with cooking/housework;Two people to help with walking and/or transfers   Can travel by private vehicle     No  Equipment Recommendations  Wheelchair (measurements PT);Wheelchair cushion (measurements PT)    Recommendations for Other Services       Precautions / Restrictions Precautions Precautions: Fall Restrictions Weight Bearing Restrictions: Yes RLE Weight Bearing: Non weight bearing     Mobility  Bed Mobility Overal bed mobility: Needs Assistance Bed Mobility: Sit to Supine     Sit to supine: Min assist   General bed mobility comments: assist for safety/positioning    Transfers Overall  transfer level: Needs assistance Equipment used: None Transfers: Bed to chair/wheelchair/BSC       Squat pivot transfers: Mod assist (recliner to bed to L side)   General transfer comment: vc's for technique    Ambulation/Gait                   Stairs             Wheelchair Mobility     Tilt Bed    Modified Rankin (Stroke Patients Only)       Balance Overall balance assessment: Needs assistance Sitting-balance support: No upper extremity supported, Feet supported Sitting balance-Leahy Scale: Good Sitting balance - Comments: steady reaching within BOS                                    Cognition Arousal: Alert Behavior During Therapy: Flat affect Overall Cognitive Status: Within Functional Limits for tasks assessed                                 General Comments: slower processing; increased time for activities        Exercises General Exercises - Lower Extremity Short Arc Quad: AROM, Strengthening, Left, 10 reps, Supine Long Arc Quad: AROM, Strengthening, Left, 10 reps, Seated Heel Slides: AROM, Strengthening, Left, 10 reps, Supine Straight Leg Raises: AROM, Strengthening, Right, 10 reps, Supine Hip Flexion/Marching: AROM, Left, AAROM, Right, Strengthening, Both, 10 reps, Seated    General Comments        Pertinent Vitals/Pain Pain Assessment Pain  Assessment: 0-10 Pain Score: 6  Pain Location: R LE residual limb Pain Descriptors / Indicators: Discomfort, Sore Pain Intervention(s): Limited activity within patient's tolerance, Monitored during session, Repositioned Vitals (HR and SpO2 on room air) stable and WFL throughout treatment session.    Home Living                          Prior Function            PT Goals (current goals can now be found in the care plan section) Acute Rehab PT Goals Patient Stated Goal: to improve mobility PT Goal Formulation: With patient Time For Goal  Achievement: 10/05/22 Potential to Achieve Goals: Good Progress towards PT goals: Progressing toward goals    Frequency    Min 1X/week      PT Plan      Co-evaluation              AM-PAC PT "6 Clicks" Mobility   Outcome Measure  Help needed turning from your back to your side while in a flat bed without using bedrails?: A Lot Help needed moving from lying on your back to sitting on the side of a flat bed without using bedrails?: A Lot Help needed moving to and from a bed to a chair (including a wheelchair)?: A Lot Help needed standing up from a chair using your arms (e.g., wheelchair or bedside chair)?: A Lot Help needed to walk in hospital room?: Total Help needed climbing 3-5 steps with a railing? : Total 6 Click Score: 10    End of Session Equipment Utilized During Treatment: Gait belt Activity Tolerance: Patient tolerated treatment well Patient left: in bed;with call bell/phone within reach;with bed alarm set;Other (comment) (B LE's elevated on pillows; L heel floating) Nurse Communication: Mobility status;Precautions PT Visit Diagnosis: Other abnormalities of gait and mobility (R26.89);Muscle weakness (generalized) (M62.81);Pain;History of falling (Z91.81) Pain - Right/Left: Right Pain - part of body: Leg     Time: 9604-5409 PT Time Calculation (min) (ACUTE ONLY): 13 min  Charges:    $Therapeutic Exercise: 8-22 mins $Therapeutic Activity: 8-22 mins PT General Charges $$ ACUTE PT VISIT: 1 Visit                     Hendricks Limes, PT 09/27/22, 3:37 PM

## 2022-09-27 NOTE — Progress Notes (Signed)
OT Cancellation Note  Patient Details Name: Martin Mullen MRN: [id number] DOB: 1980-06-09   Cancelled Treatment:    Reason Eval/Treat Not Completed: Patient declined, no reason specified (pt refusing t/f to chair or even EOB or ADLs. Unable to convince him that getting out of bed is good for him. Cites 6/10 pain, which is less than he cited yesterday when he did participate with OT.)  Alvester Morin 09/27/2022, 9:40 AM

## 2022-09-27 NOTE — Progress Notes (Signed)
Physical Therapy Treatment Patient Details Name: Martin Mullen MRN: [id number] DOB: 15-Jul-1980 Today's Date: 09/27/2022   History of Present Illness Martin Mullen is a 42 y.o. 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  During session pt mod assist with bed mobility and squat pivot transfer bed to recliner.  Pt set up for lunch in recliner with all needs in reach.  Will continue to progress mobility during hospitalization.    If plan is discharge home, recommend the following: A lot of help with bathing/dressing/bathroom;Help with stairs or ramp for entrance;Assist for transportation;Assistance with cooking/housework;Two people to help with walking and/or transfers   Can travel by private vehicle     No  Equipment Recommendations  Wheelchair (measurements PT);Wheelchair cushion (measurements PT)    Recommendations for Other Services       Precautions / Restrictions Precautions Precautions: Fall Restrictions Weight Bearing Restrictions: Yes RLE Weight Bearing: Non weight bearing     Mobility  Bed Mobility Overal bed mobility: Needs Assistance Bed Mobility: Supine to Sit     Supine to sit: Mod assist, HOB elevated     General bed mobility comments: assist for trunk; vc's for technique    Transfers Overall transfer level: Needs assistance Equipment used: None Transfers: Bed to chair/wheelchair/BSC       Squat pivot transfers: Mod assist (bed to recliner to R  side)    Lateral/Scoot Transfers: Supervision (to R along bed a couple feet) General transfer comment: vc's for technique    Ambulation/Gait                   Stairs             Wheelchair Mobility     Tilt Bed    Modified Rankin (Stroke Patients Only)       Balance Overall balance assessment: Needs assistance Sitting-balance support: No upper extremity supported, Feet supported Sitting balance-Leahy Scale: Good Sitting balance - Comments: steady reaching within BOS                                    Cognition Arousal: Alert Behavior During Therapy: Flat affect Overall Cognitive Status: Within Functional Limits for tasks assessed                                 General Comments: slower processing; increased time for activities        Exercises      General Comments       Pertinent Vitals/Pain Pain Assessment Pain Assessment: 0-10 Pain Score: 7  Pain Location: R LE residual limb Pain Descriptors / Indicators: Discomfort, Sore Pain Intervention(s): Limited activity within patient's tolerance, Monitored during session, Premedicated before session, Repositioned    Home Living                          Prior Function  PT Goals (current goals can now be found in the care plan section) Acute Rehab PT Goals Patient Stated Goal: to improve mobility PT Goal Formulation: With patient Time For Goal Achievement: 10/05/22 Potential to Achieve Goals: Good Progress towards PT goals: Progressing toward goals    Frequency    Min 1X/week      PT Plan      Co-evaluation              AM-PAC PT "6 Clicks" Mobility   Outcome Measure  Help needed turning from your back to your side while in a flat bed without using bedrails?: A Lot Help needed moving from lying on your back to sitting on the side of a flat bed without using bedrails?: A Lot Help needed moving to and from a bed to a chair  (including a wheelchair)?: A Lot Help needed standing up from a chair using your arms (e.g., wheelchair or bedside chair)?: A Lot Help needed to walk in hospital room?: Total Help needed climbing 3-5 steps with a railing? : Total 6 Click Score: 10    End of Session Equipment Utilized During Treatment: Gait belt Activity Tolerance: Patient limited by pain;Patient limited by fatigue Patient left: in chair;with call bell/phone within reach;with chair alarm set Nurse Communication: Mobility status;Precautions PT Visit Diagnosis: Other abnormalities of gait and mobility (R26.89);Muscle weakness (generalized) (M62.81);Pain;History of falling (Z91.81) Pain - Right/Left: Right Pain - part of body: Leg     Time: 1200-1213 PT Time Calculation (min) (ACUTE ONLY): 13 min  Charges:    $Therapeutic Activity: 8-22 mins PT General Charges $$ ACUTE PT VISIT: 1 Visit                    Hendricks Limes, PT 09/27/22, 12:26 PM

## 2022-09-27 NOTE — Progress Notes (Signed)
Patient ordered food that was delivered this afternoon around 1630 from [employer]. He ordered and drank a 64 oz of sweet tea and an extra large roast beef sandwich.  Pt ate and drank all.

## 2022-09-28 DIAGNOSIS — Z9089 Acquired absence of other organs: Secondary | ICD-10-CM | POA: Insufficient documentation

## 2022-09-28 DIAGNOSIS — Z89611 Acquired absence of right leg above knee: Secondary | ICD-10-CM | POA: Diagnosis not present

## 2022-09-28 DIAGNOSIS — I998 Other disorder of circulatory system: Secondary | ICD-10-CM | POA: Diagnosis not present

## 2022-09-28 DIAGNOSIS — S78119A Complete traumatic amputation at level between unspecified hip and knee, initial encounter: Secondary | ICD-10-CM | POA: Insufficient documentation

## 2022-09-28 DIAGNOSIS — Z7982 Long term (current) use of aspirin: Secondary | ICD-10-CM | POA: Insufficient documentation

## 2022-09-28 DIAGNOSIS — I2510 Atherosclerotic heart disease of native coronary artery without angina pectoris: Secondary | ICD-10-CM | POA: Insufficient documentation

## 2022-09-28 DIAGNOSIS — I7090 Unspecified atherosclerosis: Secondary | ICD-10-CM | POA: Diagnosis not present

## 2022-09-28 LAB — GLUCOSE, CAPILLARY
Glucose-Capillary: 186 mg/dL — ABNORMAL HIGH (ref 70–99)
Glucose-Capillary: 315 mg/dL — ABNORMAL HIGH (ref 70–99)

## 2022-09-28 MED ORDER — OXYCODONE HCL 5 MG PO TABS: 5.0000 mg | ORAL_TABLET | Freq: Four times a day (QID) | ORAL | 0 refills | Status: AC | PRN

## 2022-09-28 MED ORDER — NICOTINE 7 MG/24HR TD PT24: 7.0000 mg | MEDICATED_PATCH | Freq: Every day | TRANSDERMAL | Status: AC

## 2022-09-28 MED ORDER — INSULIN ASPART 100 UNIT/ML IJ SOLN: 0.0000 [IU] | Freq: Three times a day (TID) | INTRAMUSCULAR | Status: DC

## 2022-09-28 MED ORDER — INSULIN GLARGINE-YFGN 100 UNIT/ML ~~LOC~~ SOLN: 5.0000 [IU] | Freq: Every day | SUBCUTANEOUS | Status: DC

## 2022-09-28 NOTE — TOC Transition Note (Signed)
Transition of Care [facility]) - CM/SW Discharge Note   Patient Details  Name: Martin Mullen MRN: [id number] Date of Birth: November 21, 1980  Transition of Care [facility] CM/SW Contact:  Chapman Fitch, RN Phone Number: 09/28/2022, 2:32 PM   Clinical Narrative:     Notified by Philippa Chester at [facility] they have auth and can admit patient today  She states she will pull the dc summary from epic   Patient will DC to:[facility] Anticipated DC date: 09/28/22  Family notified: POA tabitha  Transport by: [facility]  Per MD patient ready for DC to . RN, patient, patient's family, and facility notified of DC. Discharge Summary sent to facility. RN given number for report. DC packet on chart. Ambulance transport requested for patient.  TOC signing off.    Barriers to Discharge: English as a second language teacher, Continued Medical Work up, SNF Pending bed offer   Patient Goals and CMS Choice      Discharge Placement                         Discharge Plan and Services Additional resources added to the After Visit Summary for       Post Acute Care Choice: Skilled Nursing Facility                               Social Determinants of Health (SDOH) Interventions SDOH Screenings   Food Insecurity: No Food Insecurity (09/22/2022)  Housing: Low Risk  (09/22/2022)  Transportation Needs: No Transportation Needs (09/22/2022)  Utilities: Not At Risk (09/22/2022)  Alcohol Screen: Low Risk  (07/01/2020)  Depression (PHQ2-9): High Risk (05/10/2022)  Financial Resource Strain: Low Risk  (06/10/2022)  Tobacco Use: High Risk (09/20/2022)     Readmission Risk Interventions     No data to display

## 2022-09-28 NOTE — Progress Notes (Signed)
Pt discharged to rehab facility. Left unit on stretcher pushed by EMS staff. Left in stable condition. Attempted calling report twice to nursing facility. Call transferred twice; but no response from other line.

## 2022-09-28 NOTE — Progress Notes (Signed)
Occupational Therapy Treatment Patient Details Name: Martin Mullen MRN: [id number] DOB: 25-Mar-1980 Today's Date: 09/28/2022   History of present illness Martin Mullen is a 42 y.o. 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 Upon arrival to room pt sidelying in bed, agreeable to tx. Pt requires MIN A + RW sit<>stand. MOD A hand washing standing sink side, L knee buckling noted as pt fatigues. Requires single UE support. Pt making good progress toward goals, will continue to follow POC. Discharge recommendation remains appropriate.        If plan is discharge home, recommend the following:  Assist for transportation;Assistance with cooking/housework;Help with stairs or ramp for entrance;A lot of help with walking and/or transfers;A lot of help with bathing/dressing/bathroom   Equipment Recommendations  Other (comment) (defer)    Recommendations for Other Services      Precautions / Restrictions Precautions Precautions: Fall Restrictions Weight Bearing Restrictions: Yes RLE Weight Bearing: Non weight bearing       Mobility Bed Mobility Overal bed mobility: Needs Assistance Bed Mobility: Sit to Supine, Supine to Sit, Rolling Rolling: Supervision, Used rails   Supine to sit: Min assist Sit to supine: Supervision        Transfers Overall transfer level: Needs assistance Equipment used: Rolling walker (2 wheels) Transfers: Sit to/from Stand, Bed to chair/wheelchair/BSC Sit to Stand: Min assist                  Balance Overall balance assessment: Needs assistance Sitting-balance support: No upper extremity supported, Feet supported Sitting balance-Leahy Scale: Good     Standing balance support: Single extremity supported, During functional activity Standing balance-Leahy Scale: Poor                             ADL either performed or assessed with clinical judgement   ADL Overall ADL's : Needs assistance/impaired                                       General ADL Comments: MOD A hand washing standing sink side, asasist for balance      Cognition Arousal: Alert Behavior During Therapy: Flat affect Overall Cognitive Status: Within Functional Limits for tasks assessed                                 General Comments: slower processing; pleasant                   Pertinent Vitals/ Pain       Pain Assessment Pain Assessment: No/denies pain   Frequency  Min 1X/week        Progress Toward Goals  OT Goals(current goals can now be found in the care plan section)  Progress towards OT goals: Progressing toward goals  Acute Rehab OT Goals Patient Stated Goal: to go home OT Goal Formulation: With patient Time  For Goal Achievement: 10/05/22 Potential to Achieve Goals: Good ADL Goals Pt Will Perform Grooming: with set-up;with supervision;standing Pt Will Perform Lower Body Dressing: sit to/from stand;with supervision Pt Will Transfer to Toilet: with modified independence;ambulating;bedside commode  Plan      Co-evaluation                 AM-PAC OT "6 Clicks" Daily Activity     Outcome Measure   Help from another person eating meals?: None Help from another person taking care of personal grooming?: A Lot Help from another person toileting, which includes using toliet, bedpan, or urinal?: A Lot Help from another person bathing (including washing, rinsing, drying)?: A Lot Help from another person to put  on and taking off regular upper body clothing?: None Help from another person to put on and taking off regular lower body clothing?: A Lot 6 Click Score: 16    End of Session    OT Visit Diagnosis: Other abnormalities of gait and mobility (R26.89);Muscle weakness (generalized) (M62.81)   Activity Tolerance Patient tolerated treatment well   Patient Left in bed;with call bell/phone within reach;with bed alarm set   Nurse Communication          Time: 8841-6606 OT Time Calculation (min): 13 min  Charges: OT General Charges $OT Visit: 1 Visit OT Treatments $Self Care/Home Management : 8-22 mins  Kathie Dike, M.S. OTR/L  09/28/22, 2:21 PM  ascom [phone]

## 2022-09-28 NOTE — Progress Notes (Signed)
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 Recovering as expected. No complaints and vitals remain stable. Looking for placement/rehab now.  [facility] will not take him d/t social/living situation, TOC working on SNF, here until placement.   Vitals:   09/28/22 0427 09/28/22 0742  BP: (!) 125/93 117/79  Pulse: 97 87  Resp: 18 16  Temp: 97.8 F (36.6 C) 98.7 F (37.1 C)  SpO2: 96% 99%   Physical Exam: Cardiac:  RRR, with tachycardic Rate, normal S1, S2.  No murmurs appreciated Lungs: Clear throughout on auscultation, no rales, rhonchi or wheezing noted. Incisions: Left groin incision with erythema.  Dressing applied, clean dry and intact.  No hematoma noted Extremities: Lower extremity is warm to touch with palpable pulses.  Right lower extremity below the knee cool to touch with mottled purple foot and toes.  Without Doppler pulses. Abdomen: Positive bowel sounds throughout, soft, nontender and nondistended. Neurologic: Alert and oriented to 2,name and place,  history of CVA x 3.  Patient unable to follow commands appropriately all the time  CBC    Component Value Date/Time   WBC 8.3 09/28/2022 0513   RBC 3.72 (L) 09/28/2022 0513   HGB 11.2 (L) 09/28/2022 0513   HGB 17.9 (H) 08/11/2021 0937   HCT 33.7 (L) 09/28/2022 0513   HCT 51.0 08/11/2021 0937   PLT 596 (H) 09/28/2022 0513   PLT 261 08/11/2021 0937   MCV 90.6 09/28/2022 0513   MCV 93 08/11/2021 0937   MCV 93 01/29/2013 0040   MCH 30.1 09/28/2022 0513   MCHC 33.2 09/28/2022 0513   RDW 13.2 09/28/2022 0513   RDW 12.4 08/11/2021 0937   RDW 13.8 01/29/2013 0040   LYMPHSABS 1.8 09/16/2022 1618   LYMPHSABS 4.8 (H) 08/11/2021 0937   MONOABS 0.7 09/16/2022 1618   EOSABS 0.1 09/16/2022 1618   EOSABS 0.5 (H) 08/11/2021 0937   BASOSABS 0.1 09/16/2022 1618   BASOSABS 0.1 08/11/2021 0937    BMET     Component Value Date/Time   NA 133 (L) 09/28/2022 0513   NA 140 08/11/2021 0937   NA 139 01/29/2013 0040   K 4.1 09/28/2022 0513   K 4.0 01/29/2013 0040   CL 96 (L) 09/28/2022 0513   CL 107 01/29/2013 0040   CO2 28 09/28/2022 0513   CO2 28 01/29/2013 0040   GLUCOSE 181 (H) 09/28/2022 0513   GLUCOSE 228 (H) 01/29/2013 0040   BUN 10 09/28/2022 0513   BUN 11 08/11/2021 0937   BUN 15 01/29/2013 0040   CREATININE 0.64 09/28/2022 0513   CREATININE 0.82 08/11/2022 1455   CALCIUM 8.9 09/28/2022 0513   CALCIUM 9.2 01/29/2013 0040   GFRNONAA >60 09/28/2022 0513   GFRNONAA >60 01/29/2013 0040   GFRAA 104 11/13/2019 1550   GFRAA >60 01/29/2013 0040    INR    Component Value Date/Time   INR 1.1 09/16/2022 1618     Intake/Output Summary (Last 24 hours) at 09/28/2022 1208 Last data filed at 09/28/2022 1044 Gross per 24 hour  Intake 720 ml  Output 875 ml  Net -155 ml     Assessment/Plan:  42 y.o. male is s/p Right above the knee amputation   8 Days Post-Op   PLAN: Continue PT/OT Pain Medications PRN Waiting on Placement.    DVT prophylaxis:  ASA 81 mg Daily.  Marcie [facility] 09/28/2022 12:08 PM

## 2022-09-28 NOTE — Progress Notes (Signed)
PROGRESS NOTE Martin Mullen   UEA:[id number] DOB: 13-Nov-1980  DOA: 09/12/2022 Date of Service: 09/28/22 PCP: [name], NP  Brief Narrative / Hospital Course:  Martin Mullen is a 42 y.o. 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 He noted that he had not been taking his Eliquis for approximately 3 months. 07/29: to ED, Vascular consulted, started on IV heparin. Admitted.  07/30: S/p angio and stenting on the right foot.  Status post thrombectomy.  07/31: Foot cyanotic and cold. Plan for repeat angio and thrombolysis with continuous tPA infusion today 08/01: Angio and stenting of the right LE, mechanical thrombectomy 08/03: Being treated for suspected sepsis with IV antibiotics.   08/06: Right AKA  08/07 - present: stable, pending SNF placement. TOC is engaged and following for insurance authorization to [facility] [place] in [place]   Consultants:  Vascular Surgery  Infectious disease  Hematology/Oncology   Procedures: 07/30: Angio and stenting of the right LE, mechanical thrombectomy 08/01: Repeat Angio and stenting of the right LE, mechanical thrombectomy 08/06: Right above-the-knee amputation  ASSESSMENT & PLAN: Acute right lower limb ischemia, s/p R AKA 8/6 Continue Eliquis  Tylenol, Oxycodone prn pain PT/OT- recommending SNF, pending placement   Sepsis likely due to diabetic foot ulcer/infection: resolved  Fever, tachycardia, hypotension on 8/2 likely due to diabetic foot ulcer/gangrene. Blood cultures remained negative.  Diabetic foot ulcer involving the second right toe that has been since  amputated. Remains an ulcer at his amputation site, wound culture growing multiple species.  - ID was consulted. Completed Abx course. Remains afebrile.    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 Has been on chronic Eliquis, but self discontinued 3 months ago.  Continue Eliquis  discharge on Eliquis and antiplatelet to follow w/ [facility]monc outpatient    DM2- HbA1c 7.7.  Continue on SSI w/ accuchecks    Hx of CAD  HTN- well controlled, chest pain free Continue ARB, may add BB if BP tolerates    Goals of care conversation  Palliative care has consulted .   Full code     DVT prophylaxis: eliquis  Central lines / invasive devices: none   Code Status: FULL CODE  ACP documentation reviewed:Martin Mullen is HCPOA   Current Admission Status: inpatient   TOC needs / Dispo plan: rehab placement  Barriers to discharge / significant pending items: pending rehab placement  Subjective / Brief ROS:  Patient reports no concerns today. Asks for pain medications for his amputation site. No other concerns today.   Family Communication: none at bedside  Objective Findings: Blood pressure 117/79, pulse 87, temperature 98.7 F (37.1 C), temperature source Oral, resp. rate 16, height 5\' 11"  (1.803 m), weight 76.7 kg, SpO2 99%.  Physical Exam Constitutional:      General: He is not in acute distress.    Appearance: He is normal weight. He is not ill-appearing.  Cardiovascular:     Rate and Rhythm: Normal rate and regular rhythm.  Pulmonary:     Effort: Pulmonary effort is normal.     Breath sounds: Normal breath sounds.  Abdominal:     General: Abdomen is flat.  Palpations: Abdomen is soft.  Musculoskeletal:     Left lower leg: No edema.     Comments: Right stump in clean, dry dressing  Skin:    General: Skin is warm and dry.  Neurological:     Mental Status: He is alert. Mental status is at baseline.  Psychiatric:        Mood and Affect: Mood  normal.        Behavior: Behavior normal.    Data Reviewed:  I have personally reviewed the following...  CBC: Recent Labs  Lab 09/26/22 0356 09/28/22 0513  WBC 8.8 8.3  HGB 10.5* 11.2*  HCT 30.7* 33.7*  MCV 90.6 90.6  PLT 562* 596*   Basic Metabolic Panel: Recent Labs  Lab 09/23/22 0423 09/26/22 0356 09/28/22 0513  NA  --  132* 133*  K  --  4.0 4.1  CL  --  96* 96*  CO2  --  27 28  GLUCOSE  --  169* 181*  BUN  --  11 10  CREATININE 0.57* 0.65 0.64  CALCIUM  --  8.6* 8.9    LOS: 16 days     Martin Bock, DO [facility] 09/28/2022, 7:34 AM   If 7PM-7AM, please contact night coverage [email or web address]

## 2022-09-28 NOTE — TOC Progression Note (Signed)
Transition of Care [facility]) - Progression Note    Patient Details  Name: DANSBY BLASE MRN: [id number] Date of Birth: Mar 21, 1980  [facility]) CM/SW Contact  Chapman Fitch, RN Phone Number: 09/28/2022, 11:13 AM  Clinical Narrative:     Insurance auth pending for [facility] SNF in [place]    Expected Discharge Plan: Skilled Nursing Facility Barriers to Discharge: English as a second language teacher, Continued Medical Work up, SNF Pending bed offer  Expected Discharge Plan and Services     Post Acute Care Choice: Skilled Nursing Facility Living arrangements for the past 2 months: Mobile Home                                       Social Determinants of Health (SDOH) Interventions SDOH Screenings   Food Insecurity: No Food Insecurity (09/22/2022)  Housing: Low Risk  (09/22/2022)  Transportation Needs: No Transportation Needs (09/22/2022)  Utilities: Not At Risk (09/22/2022)  Alcohol Screen: Low Risk  (07/01/2020)  Depression (PHQ2-9): High Risk (05/10/2022)  Financial Resource Strain: Low Risk  (06/10/2022)  Tobacco Use: High Risk (09/20/2022)    Readmission Risk Interventions     No data to display

## 2022-09-28 NOTE — Discharge Summary (Signed)
Physician Discharge Summary  Patient: Martin Mullen XLK:[id number] DOB: Sep 24, 1980   Code Status: Full Code Admit date: 09/12/2022 Discharge date: 09/28/2022 Disposition: Skilled nursing facility, PT, OT, nurse aid, RN, and wound care PCP: [name], NP  Recommendations for Outpatient Follow-up:  Follow up with PCP within 1-2 weeks Regarding general hospital follow up and preventative care Follow up with vascular surgery within 2 weeks for wound follow up  Discharge Diagnoses:  Principal Problem:   Acute lower limb ischemia Active Problems:   Diabetic foot ulcer associated with type 2 diabetes mellitus (HCC)   Recurrent strokes (HCC)   Uncontrolled type 2 diabetes mellitus with hyperglycemia, with long-term current use of insulin (HCC)   HTN (hypertension)   CAD S/P percutaneous coronary angioplasty   Arterial occlusion   Gangrene (HCC)   Gangrene of right foot (HCC)   S/P AKA (above knee amputation) unilateral, right [facility])  Brief Hospital Course Summary: Martin Mullen is a 42 y.o. 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 He noted that he had not been taking his Eliquis for approximately 3 months. 07/29: to ED, Vascular consulted, started on IV heparin. Admitted.  07/30: S/p angio and stenting on the right foot.  Status post thrombectomy.  07/31: Foot cyanotic and cold. Plan for repeat angio and thrombolysis with continuous tPA infusion today 08/01: Angio and stenting of the right LE, mechanical thrombectomy 08/03: Being treated for suspected sepsis with IV antibiotics which completed  treatment 8/9.  Wound cultures showed multiple species and was ultimately amputated. Blood cultures remained negative. 08/06: Right AKA  08/07 - present: stable, progressing well with PT/OT. Pain is well controlled with treatment. pending SNF placement. TOC is engaged and following for insurance authorization to [facility] in [place].   Consultants:  Vascular Surgery  Infectious disease  Hematology/Oncology   Procedures: 07/30: Angio and stenting of the right LE, mechanical thrombectomy 08/01: Repeat Angio and stenting of the right LE, mechanical thrombectomy 08/06: Right above-the-knee amputation  Discharge Condition: Stable, improved Recommended discharge diet: Regular healthy diet  Consultations: Vascular surgery   Procedures/Studies: R AKA 8/6 Angio/thrombolysis 7/30, 7/31, 8/1  Allergies as of 09/28/2022   No Known Allergies      Medication List     STOP taking these medications    Lantus SoloStar 100 UNIT/ML Solostar Pen Generic drug: insulin glargine   rosuvastatin 20 MG tablet Commonly known as: CRESTOR       TAKE these medications    Accu-Chek Guide test strip Generic drug: glucose blood Use to check blood sugar three times a day for type 2 diabetes E11.9   Accu-Chek Softclix Lancets lancets Use to check blood sugar three times a day for type 2 diabetes E11.9   acetaminophen 500 MG tablet Commonly known as: TYLENOL Take 2 tablets (1,000 mg total) by mouth every 6 (six) hours as needed.   apixaban 5 MG Tabs tablet Commonly known as: ELIQUIS Take 1 tablet (5 mg total) by mouth 2 (two) times daily.   aspirin EC 81 MG tablet Take 1 tablet (81 mg total) by mouth daily.   insulin aspart 100 UNIT/ML injection Commonly known as:  novoLOG Inject 0-15 Units into the skin 3 (three) times daily with meals.   insulin glargine-yfgn 100 UNIT/ML injection Commonly known as: SEMGLEE Inject 0.05 mLs (5 Units total) into the skin daily.   losartan 25 MG  tablet Commonly known as: COZAAR Take 1 tablet (25 mg total) by mouth daily.   metFORMIN 1000 MG tablet Commonly known as: GLUCOPHAGE Take 1 tablet (1,000 mg total) by mouth 2 (two) times daily with a meal. TAKE 1 TABLET(1000 MG) BY MOUTH TWICE DAILY WITH A MEAL   nicotine 7 mg/24hr patch Commonly known as: NICODERM CQ - dosed in mg/24 hr Place 1 patch (7 mg total) onto the skin daily. Start taking on: September 29, 2022   oxyCODONE 5 MG immediate release tablet Commonly known as: Oxy IR/ROXICODONE Take 1 tablet (5 mg total) by mouth every 6 (six) hours as needed for up to 5 days for severe pain or breakthrough pain.   Ozempic (0.25 or 0.5 MG/DOSE) 2 MG/3ML Sopn Generic drug: Semaglutide(0.25 or 0.5MG /DOS) INJECT 0.25 MG UNDER THE SKIN WEEKLY FOR 4 WEEKS, THEN INCREASE TO 0.5 MG WEEKLY What changed: See the new instructions.   sildenafil 50 MG tablet Commonly known as: VIAGRA TAKE 1 TABLET(50 MG) BY MOUTH DAILY AS NEEDED FOR ERECTILE DYSFUNCTION        Follow-up Information     Georgiana Spinner, NP Follow up in 2 week(s).   Specialty: Vascular Surgery Why: Staple Removal Contact information: [address] [place] [address] [phone]                Subjective   Pt reports some mild pain in his stump. Denies drainage. Has been able to work with PT and increase his mobility. Denies further concerns.   All questions and concerns were addressed at time of discharge.  Objective  Blood pressure 117/79, pulse 87, temperature 98.7 F (37.1 C), temperature source Oral, resp. rate 16, height 5\' 11"  (1.803 m), weight 76.7 kg, SpO2 99%.   General: Pt is alert, awake, not in acute distress Cardiovascular: RRR, S1/S2 +, no rubs, no gallops Respiratory: CTA bilaterally, no wheezing, no rhonchi Abdominal: Soft, NT, ND, bowel sounds + Extremities: no edema, no cyanosis of L LE. R LE wrapped in clean, dry dressing. Trace swelling  The results of  significant diagnostics from this hospitalization (including imaging, microbiology, ancillary and laboratory) are listed below for reference.   Imaging studies: PERIPHERAL VASCULAR CATHETERIZATION  Result Date: 09/15/2022 See surgical note for result.  PERIPHERAL VASCULAR CATHETERIZATION  Result Date: 09/14/2022 See surgical note for result.  PERIPHERAL VASCULAR CATHETERIZATION  Result Date: 09/13/2022 See surgical note for result.  US Venous Img Lower Unilateral Right  Result Date: 09/13/2022 CLINICAL DATA:  Right lower extremity pain EXAM: RIGHT LOWER EXTREMITY VENOUS DOPPLER ULTRASOUND TECHNIQUE: Gray-scale sonography with compression, as well as color and duplex ultrasound, were performed to evaluate the deep venous system(s) from the level of the common femoral vein through the popliteal and proximal calf veins. COMPARISON:  06/26/2022 FINDINGS: VENOUS Normal compressibility of the common femoral, superficial femoral, and popliteal veins, as well as the visualized calf veins. Visualized portions of profunda femoral vein and great saphenous vein unremarkable. No filling defects to suggest DVT on grayscale or color Doppler imaging. Doppler waveforms show normal direction of venous flow, normal respiratory plasticity and response to augmentation. Limited views of the contralateral common femoral vein are unremarkable. OTHER None. Limitations: none IMPRESSION: Negative. Electronically Signed   By: Mauri Reading  Mir M.D.   On: 09/13/2022 11:11   [name] Lower Ext Art Right  Result Date: 09/12/2022 CLINICAL DATA:  Right lower extremity pain for 2 days EXAM: RIGHT LOWER EXTREMITY ARTERIAL DUPLEX SCAN TECHNIQUE: Gray-scale sonography as well as color Doppler and duplex ultrasound was performed to evaluate the lower extremity arteries including the common, superficial and profunda femoral arteries, popliteal artery and calf arteries. COMPARISON:  None Available. FINDINGS: Right lower Extremity Inflow: Normal  common femoral arterial waveforms and velocities. No evidence of inflow (aortoiliac) disease. Outflow: The profundus femoral and proximal and mid SFA demonstrate normal triphasic waveforms and velocities. The distal right SFA demonstrates severe atheromatous plaque, with monophasic waveforms with diminished velocities measuring 21.5 centimeters/second. A stent is identified within the proximal popliteal artery, which is occluded. There is minimal flow within the mid to distal right popliteal artery, with monophasic waveforms and diminished velocities. Runoff: The posterior tibial artery is identified through the level of the ankle, with monophasic waveform noted. The anterior tibial and dorsalis pedis arteries are not identified. IMPRESSION: 1. Severe atheromatous plaque of the distal right SFA, with monophasic waveforms and diminished velocities. 2. Occlusion of a right popliteal artery stent. 3. Patent posterior tibial artery to the level of the ankle. The anterior tibial and dorsalis pedis arteries are not identified. Four given ultrasound findings, further evaluation with angiography may be useful. Electronically Signed   By: Sharlet Salina M.D.   On: 09/12/2022 20:23    Labs: Basic Metabolic Panel: Recent Labs  Lab 09/23/22 0423 09/26/22 0356 09/28/22 0513  NA  --  132* 133*  K  --  4.0 4.1  CL  --  96* 96*  CO2  --  27 28  GLUCOSE  --  169* 181*  BUN  --  11 10  CREATININE 0.57* 0.65 0.64  CALCIUM  --  8.6* 8.9   CBC: Recent Labs  Lab 09/26/22 0356 09/28/22 0513  WBC 8.8 8.3  HGB 10.5* 11.2*  HCT 30.7* 33.7*  MCV 90.6 90.6  PLT 562* 596*   Microbiology: Results for orders placed or performed during the hospital encounter of 09/12/22  MRSA Next Gen by PCR, Nasal     Status: None   Collection Time: 09/13/22 12:14 PM   Specimen: Nasal Mucosa; Nasal Swab  Result Value Ref Range Status   MRSA by PCR Next Gen NOT DETECTED NOT DETECTED Final    Comment: (NOTE) The GeneXpert MRSA  Assay (FDA approved for NASAL specimens only), is one component of a comprehensive MRSA colonization surveillance program. It is not intended to diagnose MRSA infection nor to guide or monitor treatment for MRSA infections. Test performance is not FDA approved in patients less than 22 years old. Performed at [facility], [address] [place], [place] [address]   Culture, blood (Routine X 2) w Reflex to ID Panel     Status: None   Collection Time: 09/16/22  4:19 PM   Specimen: BLOOD  Result Value Ref Range Status   Specimen Description BLOOD RIGHT [facility]  Final   Special Requests   Final    BOTTLES DRAWN AEROBIC AND ANAEROBIC Blood Culture adequate volume   Culture   Final    NO GROWTH 5 DAYS Performed at [facility], [address] [place], [place] [address]    Report Status 09/21/2022 FINAL  Final  Culture, blood (Routine X 2) w Reflex to ID Panel     Status: None   Collection Time: 09/16/22  4:24 PM   Specimen: BLOOD RIGHT HAND  Result Value Ref Range Status   Specimen Description BLOOD RIGHT HAND  Final   Special Requests   Final    BOTTLES DRAWN AEROBIC AND ANAEROBIC Blood Culture adequate volume   Culture   Final    NO GROWTH 5 DAYS Performed at [facility], [address], [place], [place] [address]    Report Status 09/21/2022 FINAL  Final  Aerobic Culture w Gram Stain (superficial specimen)     Status: Abnormal   Collection Time: 09/16/22  7:35 PM   Specimen: Wound  Result Value Ref Range Status   Specimen Description   Final    WOUND Performed at [facility], [address] [place], [place] [address]    Special Requests   Final    RIGHT FOREARM Performed at [facility], [address], [place], [place] [address]    Gram Stain   Final    RARE SQUAMOUS EPITHELIAL CELLS PRESENT ABUNDANT GRAM NEGATIVE RODS FEW GRAM POSITIVE COCCI IN PAIRS Performed at [facility], [address], [place], [place]  [address]    Culture MULTIPLE ORGANISMS PRESENT, NONE PREDOMINANT (A)  Final   Report Status 09/19/2022 FINAL  Final   Time coordinating discharge: Over 30 minutes  [name], MD  [facility] 09/28/2022, 12:47 PM

## 2022-09-28 NOTE — Progress Notes (Signed)
Physical Therapy Treatment Patient Details Name: Martin Mullen MRN: [id number] DOB: [birth year] Today's Date: 09/28/2022   History of Present Illness Martin Mullen is a 42 y.o. 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 He is s/p several thrombectomies and limb salvage attempts, but progressed to requiring AKA which was performed 09/20/22    PT Comments  Pt sitting on EOB finishing eating breakfast upon PT arrival; pt agreeable to therapy.  5/10 R LE residual limb pain reported during session (pt received pain medication already this morning).  During session pt mod assist with sit to stand transfers using RW and min assist to ambulate 10 feet with RW use (2nd assist for chair follow for safety).  Pt requiring vc's for gait technique.  Limited distance ambulating d/t pt fatigue/generalized weakness.  Will continue to focus on strengthening and progressive functional mobility during hospitalization.   If plan is discharge home, recommend the following: A lot of help with bathing/dressing/bathroom;Help with stairs or ramp for entrance;Assist for transportation;Assistance with cooking/housework;Two people to help with walking and/or transfers   Can travel by private vehicle     No  Equipment Recommendations  Wheelchair (measurements PT);Wheelchair cushion (measurements PT)    Recommendations for Other Services       Precautions / Restrictions Precautions Precautions: Fall Restrictions Weight Bearing Restrictions: Yes RLE Weight Bearing: Non weight bearing     Mobility  Bed Mobility               General bed mobility comments: Deferred (pt sitting on EOB upon PT arrival)     Transfers Overall transfer level: Needs assistance Equipment used: Rolling walker (2 wheels) Transfers: Sit to/from Stand, Bed to chair/wheelchair/BSC Sit to Stand: Mod assist   Step pivot transfers: Min assist       General transfer comment: x1 trial standing from bed and x1 trial standing from recliner (vc's for UE placement); stand "hop" transfer with RW bed to recliner (vc's for technique)    Ambulation/Gait Ambulation/Gait assistance: Min assist, +2 safety/equipment (chair follow for safety) Gait Distance (Feet): 10 Feet Assistive device: Rolling walker (2 wheels) Gait Pattern/deviations: Decreased step length - left Gait velocity: decreased     General Gait Details: vc's and visual cues for technique and walker use; hop to gait pattern; vc's to increase UE support through RW to advance L LE and to keep RW closer   Stairs             Wheelchair Mobility     Tilt Bed    Modified Rankin (Stroke Patients Only)       Balance Overall balance assessment: Needs assistance Sitting-balance support: No upper extremity supported, Feet supported Sitting balance-Leahy Scale: Good Sitting balance - Comments: steady reaching within BOS   Standing balance support: Bilateral upper extremity supported, Reliant on assistive device for balance, During functional activity Standing balance-Leahy Scale: Poor Standing balance comment: assist to balance with standing activities (B UE support on RW)                            Cognition Arousal: Alert Behavior During Therapy: Flat affect Overall Cognitive Status: Within Functional Limits for tasks assessed  General Comments: slower processing; pleasant        Exercises      General Comments General comments (skin integrity, edema, etc.): dressing R residual limb in place.  Pt agreeable to PT session.      Pertinent Vitals/Pain Pain Assessment Pain  Assessment: 0-10 Pain Score: 5  Pain Location: R LE residual limb Pain Descriptors / Indicators: Discomfort, Sore Pain Intervention(s): Limited activity within patient's tolerance, Monitored during session, Premedicated before session, Repositioned Vitals (HR and SpO2 on room air) stable throughout treatment session.    Home Living                          Prior Function            PT Goals (current goals can now be found in the care plan section) Acute Rehab PT Goals Patient Stated Goal: to improve mobility PT Goal Formulation: With patient Time For Goal Achievement: 10/05/22 Potential to Achieve Goals: Good Progress towards PT goals: Progressing toward goals    Frequency    Min 1X/week      PT Plan      Co-evaluation              AM-PAC PT "6 Clicks" Mobility   Outcome Measure  Help needed turning from your back to your side while in a flat bed without using bedrails?: A Lot Help needed moving from lying on your back to sitting on the side of a flat bed without using bedrails?: A Lot Help needed moving to and from a bed to a chair (including a wheelchair)?: A Lot Help needed standing up from a chair using your arms (e.g., wheelchair or bedside chair)?: A Lot Help needed to walk in hospital room?: A Little Help needed climbing 3-5 steps with a railing? : Total 6 Click Score: 12    End of Session Equipment Utilized During Treatment: Gait belt Activity Tolerance: Patient tolerated treatment well Patient left: in chair;with call bell/phone within reach;with chair alarm set;Other (comment) (B LE's elevated via pillows (L heel floating)) Nurse Communication: Mobility status;Precautions PT Visit Diagnosis: Other abnormalities of gait and mobility (R26.89);Muscle weakness (generalized) (M62.81);Pain;History of falling (Z91.81) Pain - Right/Left: Right Pain - part of body: Leg     Time: 4098-1191 PT Time Calculation (min) (ACUTE ONLY): 28  min  Charges:    $Gait Training: 8-22 mins $Therapeutic Activity: 8-22 mins PT General Charges $$ ACUTE PT VISIT: 1 Visit                     Hendricks Limes, PT 09/28/22, 10:30 AM

## 2022-10-18 ENCOUNTER — Telehealth: Payer: Self-pay

## 2022-10-18 NOTE — Progress Notes (Signed)
Care Coordination Note  10/18/2022 Name: Martin Mullen MRN: [id number] DOB: 06-04-1980  Martin Mullen is a 42 y.o. year old male who is a primary care patient of [name], NP and is actively engaged with the Chronic Care Management team. I reached out to Mayo Ao by phone today to assist with re-scheduling a follow up visit with the Pharmacist  Follow up plan: Unsuccessful telephone outreach attempt made. A HIPAA compliant phone message was left for the patient providing contact information and requesting a return call.  If patient returns call to provider office, please advise to call CCM Care Guide Penne Lash  at [phone]  Penne Lash, RMA Care Guide [facility]  [place], [place] [address] Direct Dial: [phone] [email or web address]

## 2022-11-02 NOTE — Progress Notes (Signed)
Care Coordination Note  11/02/2022 Name: [name] MRN: [id number] DOB: 28-Nov-1980  Martin Mullen is a 42 y.o. year old male who is a primary care patient of [name], NP and is actively engaged with the Chronic Care Management team. I reached out to Mayo Ao by phone today to assist with re-scheduling a follow up visit with the Pharmacist  Follow up plan: Unsuccessful telephone outreach attempt made. A HIPAA compliant phone message was left for the patient providing contact information and requesting a return call.  If patient returns call to provider office, please advise to call CCM Care Guide Penne Lash  at [phone]  Penne Lash, RMA Care Guide [facility]  [place], [place] [address] Direct Dial: [phone] [email or web address]

## 2022-11-07 NOTE — Progress Notes (Signed)
Care Coordination Note  11/07/2022 Name: Martin Mullen MRN: [id number] DOB: 1980/07/19  Martin Mullen is a 42 y.o. year old male who is a primary care patient of [name], NP and is actively engaged with the Chronic Care Management team. I reached out to Martin Mullen by phone today to assist with re-scheduling a follow up visit with the Pharmacist  Follow up plan: Unable to make contact on outreach attempts x 3. PCP [name], NP notified via routed documentation in medical record.   Penne Lash, RMA Care Guide [facility]  [place], [place] [address] Direct Dial: [phone] [email or web address]

## 2022-11-11 ENCOUNTER — Ambulatory Visit: Payer: Medicaid Other | Admitting: Internal Medicine

## 2022-11-11 ENCOUNTER — Other Ambulatory Visit: Payer: Self-pay | Admitting: Internal Medicine

## 2022-11-11 NOTE — Progress Notes (Deleted)
Subjective:    Patient ID: Martin Mullen, male    DOB: 04-08-1980, 42 y.o.   MRN: [id number]  HPI  Patient presents to clinic today for 23-month follow-up of chronic conditions.  HTN: His BP today is.  He is taking losartan as prescribed.  ECG from 08/2022 reviewed.  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  His last LDL was 71, triglycerides 191, 07/2022.  He denies myalgias on rosuvastatin.  He is taking aspirin and eliquis as well.  He does not consume a low-fat diet.  He follows with cardiology but not neurology.  DM2: His last A1c was 7.7%, 07/2019.  He is taking metformin, novoLog, semglee and ozempic as prescribed.  His sugars range.  He does not check his feet routinely.  His last eye exam was >1-year ago.  Flu.  Pneumovax 06/2017.  COVID never.  GERD: He is not sure what triggers this.  He is not taking any medication for this at this time.  There is no upper GI on file.  Anxiety and depression: Chronic, but he is not currently taking any medications for this.  He is not currently seeing a therapist.  He denies SI/HI.  ED: Managed with sildenafil as needed.  He does not follow with urology.  Review of Systems  Past Medical History:  Diagnosis Date   Acute transmural inferior wall MI (HCC) 08/28/2012   .5 x 12 mm Veri-flex stent non-DES.   Chicken pox    Coronary artery disease    Inferior ST elevation myocardial infarction in July of 2014. Cardiac catheterization showed 95% distal RCA stenosis and 90% first diagonal stenosis with normal ejection fraction. He underwent PCI in bare-metal stent placement to the distal RCA.   Diabetes mellitus without complication (HCC)    Hypercholesterolemia    Stroke (HCC)    Tobacco use     Current Outpatient Medications  Medication Sig Dispense Refill   Accu-Chek Softclix Lancets lancets Use to check blood sugar three times a day for type 2 diabetes E11.9 100 each 5   acetaminophen  (TYLENOL) 500 MG tablet Take 2 tablets (1,000 mg total) by mouth every 6 (six) hours as needed. 100 tablet 2   apixaban (ELIQUIS) 5 MG TABS tablet Take 1 tablet (5 mg total) by mouth 2 (two) times daily. 180 tablet 0   aspirin EC 81 MG tablet Take 1 tablet (81 mg total) by mouth daily. 90 tablet 0   glucose blood (ACCU-CHEK GUIDE) test strip Use to check blood sugar three times a day for type 2 diabetes E11.9 100 strip 5   insulin aspart (NOVOLOG) 100 UNIT/ML injection Inject 0-15 Units into the skin 3 (three) times daily with meals.     insulin glargine-yfgn (SEMGLEE) 100 UNIT/ML injection Inject 0.05 mLs (5 Units total) into the skin daily.     losartan (COZAAR) 25 MG tablet Take 1 tablet (25 mg total) by mouth daily. 90 tablet 0   metFORMIN (GLUCOPHAGE) 1000 MG tablet Take 1 tablet (1,000 mg total) by mouth 2 (two) times daily with a meal. TAKE 1 TABLET(1000 MG) BY MOUTH TWICE DAILY WITH A MEAL 180 tablet 0   OZEMPIC, 0.25 OR 0.5 MG/DOSE, 2 MG/3ML SOPN INJECT 0.25 MG UNDER THE SKIN WEEKLY FOR 4 WEEKS, THEN INCREASE TO 0.5 MG WEEKLY 9 mL 0   sildenafil (VIAGRA) 50 MG tablet TAKE 1 TABLET(50 MG) BY MOUTH DAILY AS NEEDED FOR ERECTILE DYSFUNCTION 10 tablet 0  No current facility-administered medications for this visit.    No Known Allergies  Family History  Problem Relation Age of Onset   Arthritis Mother        Rheumatoid   Diabetes Mother    Hyperlipidemia Mother    Diabetes Father    Cancer Maternal Grandfather        Prostate   Parkinson's disease Maternal Grandfather     Social History   Socioeconomic History   Marital status: Divorced    Spouse name: Not on file   Number of children: 2   Years of education: Not on file   Highest education level: Not on file  Occupational History   Not on file  Tobacco Use   Smoking status: Every Day    Current packs/day: 1.00    Average packs/day: 1 pack/day for 30.0 years (30.0 ttl pk-yrs)    Types: Cigarettes   Smokeless tobacco:  Never  Vaping Use   Vaping status: Never Used  Substance and Sexual Activity   Alcohol use: Not Currently    Alcohol/week: 0.0 standard drinks of alcohol    Comment: occasional   Drug use: Not Currently    Types: Marijuana   Sexual activity: Yes    Birth control/protection: None  Other Topics Concern   Not on file  Social History Narrative   Lives with girlfriend and her mother    Social Determinants of Health   Financial Resource Strain: Low Risk  (06/10/2022)   Overall Financial Resource Strain (CARDIA)    Difficulty of Paying Living Expenses: Not very hard  Food Insecurity: No Food Insecurity (09/22/2022)   Hunger Vital Sign    Worried About Running Out of Food in the Last Year: Never true    Ran Out of Food in the Last Year: Never true  Transportation Needs: No Transportation Needs (09/22/2022)   PRAPARE - Administrator, Civil Service (Medical): No    Lack of Transportation (Non-Medical): No  Physical Activity: Not on file  Stress: Not on file  Social Connections: Not on file  Intimate Partner Violence: Not At Risk (09/22/2022)   Humiliation, Afraid, Rape, and Kick questionnaire    Fear of Current or Ex-Partner: No    Emotionally Abused: No    Physically Abused: No    Sexually Abused: No     Constitutional: Denies fever, malaise, fatigue, headache or abrupt weight changes.  HEENT: Denies eye pain, eye redness, ear pain, ringing in the ears, wax buildup, runny nose, nasal congestion, bloody nose, or sore throat. Respiratory: Denies difficulty breathing, shortness of breath, cough or sputum production.   Cardiovascular: Denies chest pain, chest tightness, palpitations or swelling in the hands or feet.  Gastrointestinal: Denies abdominal pain, bloating, constipation, diarrhea or blood in the stool.  GU: Patient reports erectile dysfunction.  Denies urgency, frequency, pain with urination, burning sensation, blood in urine, odor or discharge. Musculoskeletal:  Denies decrease in range of motion, difficulty with gait, muscle pain or joint pain and swelling.  Skin: Denies redness, rashes, lesions or ulcercations.  Neurological: Patient reports difficulty with memory.  Denies dizziness, difficulty with speech or problems with balance and coordination.  Psych: Denies anxiety, depression, SI/HI.  No other specific complaints in a complete review of systems (except as listed in HPI above).     Objective:   Physical Exam    There were no vitals taken for this visit. Wt Readings from Last 3 Encounters:  09/20/22 169 lb 1.5 oz (76.7 kg)  08/11/22 190 lb (86.2 kg)  06/30/22 203 lb 14.8 oz (92.5 kg)    General: Appears their stated age, well developed, well nourished in NAD. Skin: Warm, dry and intact. No rashes, lesions or ulcerations noted. HEENT: Head: normal shape and size; Eyes: sclera white, no icterus, conjunctiva pink, PERRLA and EOMs intact; Ears: Tm's gray and intact, normal light reflex; Nose: mucosa pink and moist, septum midline; Throat/Mouth: Teeth present, mucosa pink and moist, no exudate, lesions or ulcerations noted.  Neck:  Neck supple, trachea midline. No masses, lumps or thyromegaly present.  Cardiovascular: Normal rate and rhythm. S1,S2 noted.  No murmur, rubs or gallops noted. No JVD or BLE edema. No carotid bruits noted. Pulmonary/Chest: Normal effort and positive vesicular breath sounds. No respiratory distress. No wheezes, rales or ronchi noted.  Abdomen: Soft and nontender. Normal bowel sounds. No distention or masses noted. Liver, spleen and kidneys non palpable. Musculoskeletal: Normal range of motion. No signs of joint swelling. No difficulty with gait.  Neurological: Alert and oriented. Cranial nerves II-XII grossly intact. Coordination normal.  Psychiatric: Mood and affect normal. Behavior is normal. Judgment and thought content normal.    BMET    Component Value Date/Time   NA 133 (L) 09/28/2022 0513   NA 140  08/11/2021 0937   NA 139 01/29/2013 0040   K 4.1 09/28/2022 0513   K 4.0 01/29/2013 0040   CL 96 (L) 09/28/2022 0513   CL 107 01/29/2013 0040   CO2 28 09/28/2022 0513   CO2 28 01/29/2013 0040   GLUCOSE 181 (H) 09/28/2022 0513   GLUCOSE 228 (H) 01/29/2013 0040   BUN 10 09/28/2022 0513   BUN 11 08/11/2021 0937   BUN 15 01/29/2013 0040   CREATININE 0.64 09/28/2022 0513   CREATININE 0.82 08/11/2022 1455   CALCIUM 8.9 09/28/2022 0513   CALCIUM 9.2 01/29/2013 0040   GFRNONAA >60 09/28/2022 0513   GFRNONAA >60 01/29/2013 0040   GFRAA 104 11/13/2019 1550   GFRAA >60 01/29/2013 0040    Lipid Panel     Component Value Date/Time   CHOL 124 08/11/2022 1455   CHOL 159 08/11/2021 0937   TRIG 183 (H) 08/11/2022 1455   HDL 27 (L) 08/11/2022 1455   HDL 26 (L) 08/11/2021 0937   CHOLHDL 4.6 08/11/2022 1455   VLDL 22 09/12/2021 0419   LDLCALC 71 08/11/2022 1455    CBC    Component Value Date/Time   WBC 8.3 09/28/2022 0513   RBC 3.72 (L) 09/28/2022 0513   HGB 11.2 (L) 09/28/2022 0513   HGB 17.9 (H) 08/11/2021 0937   HCT 33.7 (L) 09/28/2022 0513   HCT 51.0 08/11/2021 0937   PLT 596 (H) 09/28/2022 0513   PLT 261 08/11/2021 0937   MCV 90.6 09/28/2022 0513   MCV 93 08/11/2021 0937   MCV 93 01/29/2013 0040   MCH 30.1 09/28/2022 0513   MCHC 33.2 09/28/2022 0513   RDW 13.2 09/28/2022 0513   RDW 12.4 08/11/2021 0937   RDW 13.8 01/29/2013 0040   LYMPHSABS 1.8 09/16/2022 1618   LYMPHSABS 4.8 (H) 08/11/2021 0937   MONOABS 0.7 09/16/2022 1618   EOSABS 0.1 09/16/2022 1618   EOSABS 0.5 (H) 08/11/2021 0937   BASOSABS 0.1 09/16/2022 1618   BASOSABS 0.1 08/11/2021 0937    Hgb A1C Lab Results  Component Value Date   HGBA1C 7.7 (H) 08/11/2022          Assessment & Plan:      RTC in 3 months, follow-up  chronic conditions [name], NP

## 2022-11-11 NOTE — Telephone Encounter (Signed)
Requested Prescriptions  Pending Prescriptions Disp Refills   metFORMIN (GLUCOPHAGE) 1000 MG tablet [Pharmacy Med Name: METFORMIN 1000MG  TABLETS] 180 tablet 0    Sig: TAKE 1 TABLET(1000 MG) BY MOUTH TWICE DAILY WITH A MEAL     Endocrinology:  Diabetes - Biguanides Failed - 11/11/2022  1:13 PM      Failed - B12 Level in normal range and within 720 days    Vitamin B-12  Date Value Ref Range Status  12/21/2019 203 180 - 914 pg/mL Final    Comment:    (NOTE) This assay is not validated for testing neonatal or myeloproliferative syndrome specimens for Vitamin B12 levels. Performed at [facility], [address], [place], [place] [address]          Passed - Cr in normal range and within 360 days    Creat  Date Value Ref Range Status  08/11/2022 0.82 0.60 - 1.29 mg/dL Final   Creatinine, Ser  Date Value Ref Range Status  09/28/2022 0.64 0.61 - 1.24 mg/dL Final   Creatinine,U  Date Value Ref Range Status  01/18/2016 120.7 mg/dL Final   Creatinine, Urine  Date Value Ref Range Status  02/08/2022 70 20 - 320 mg/dL Final         Passed - HBA1C is between 0 and 7.9 and within 180 days    HbA1c POC (<> result, manual entry)  Date Value Ref Range Status  02/08/2022 >14.0 4.0 - 5.6 % Final   Hgb A1c MFr Bld  Date Value Ref Range Status  08/11/2022 7.7 (H) <5.7 % of total Hgb Final    Comment:    For someone without known diabetes, a hemoglobin A1c value of 6.5% or greater indicates that they may have  diabetes and this should be confirmed with a follow-up  test. . For someone with known diabetes, a value <7% indicates  that their diabetes is well controlled and a value  greater than or equal to 7% indicates suboptimal  control. A1c targets should be individualized based on  duration of diabetes, age, comorbid conditions, and  other considerations. . Currently, no consensus exists regarding use of hemoglobin A1c for diagnosis of diabetes for children. .           Passed - eGFR in normal range and within 360 days    EGFR (African American)  Date Value Ref Range Status  01/29/2013 >60  Final   GFR calc Af Amer  Date Value Ref Range Status  11/13/2019 104 >59 mL/min/1.73 Final    Comment:    **[facility] currently reports eGFR in compliance with the current**   recommendations of the [employer]. [facility] will   update reporting as new guidelines are published from the NKF-ASN   Task force.    EGFR (Non-African Amer.)  Date Value Ref Range Status  01/29/2013 >60  Final    Comment:    eGFR values <51mL/min/1.73 m2 may be an indication of chronic kidney disease (CKD). Calculated eGFR is useful in patients with stable renal function. The eGFR calculation will not be reliable in acutely ill patients when serum creatinine is changing rapidly. It is not useful in  patients on dialysis. The eGFR calculation may not be applicable to patients at the low and high extremes of body sizes, pregnant women, and vegetarians.    GFR, Estimated  Date Value Ref Range Status  09/28/2022 >60 >60 mL/min Final    Comment:    (NOTE) Calculated using the  CKD-EPI Creatinine Equation (2021)    GFR  Date Value Ref Range Status  04/02/2020 96.87 >60.00 mL/min Final    Comment:    Calculated using the CKD-EPI Creatinine Equation (2021)   eGFR  Date Value Ref Range Status  08/11/2022 112 > OR = 60 mL/min/1.61m2 Final  08/11/2021 95 >59 mL/min/1.73 Final         Passed - Valid encounter within last 6 months    Recent Outpatient Visits           3 months ago Encounter for general adult medical examination with abnormal findings   [place] [facility] [place], Salvadore Oxford, NP   3 months ago Type 2 diabetes mellitus with hyperlipidemia [facility]   [place] [facility], Gentry Fitz A, RPH-CPP   4 months ago Type 2 diabetes mellitus with hyperlipidemia [facility])   [place] [facility],  Gentry Fitz A, RPH-CPP   6 months ago Adjustment disorder with mixed anxiety and depressed mood    [facility] [place], [place] W, NP   6 months ago Type 2 diabetes mellitus with hyperlipidemia [facility])    [place] [facility], Gentry Fitz A, RPH-CPP              Passed - CBC within normal limits and completed in the last 12 months    WBC  Date Value Ref Range Status  09/28/2022 8.3 4.0 - 10.5 K/uL Final   RBC  Date Value Ref Range Status  09/28/2022 3.72 (L) 4.22 - 5.81 MIL/uL Final   Hemoglobin  Date Value Ref Range Status  09/28/2022 11.2 (L) 13.0 - 17.0 g/dL Final  16/11/9602 54.0 (H) 13.0 - 17.7 g/dL Final   HCT  Date Value Ref Range Status  09/28/2022 33.7 (L) 39.0 - 52.0 % Final   Hematocrit  Date Value Ref Range Status  08/11/2021 51.0 37.5 - 51.0 % Final   MCHC  Date Value Ref Range Status  09/28/2022 33.2 30.0 - 36.0 g/dL Final   [facility]  Date Value Ref Range Status  09/28/2022 30.1 26.0 - 34.0 pg Final   MCV  Date Value Ref Range Status  09/28/2022 90.6 80.0 - 100.0 fL Final  08/11/2021 93 79 - 97 fL Final  01/29/2013 93 80 - 100 fL Final   No results found for: "PLTCOUNTKUC", "LABPLAT", "POCPLA" RDW  Date Value Ref Range Status  09/28/2022 13.2 11.5 - 15.5 % Final  08/11/2021 12.4 11.6 - 15.4 % Final  01/29/2013 13.8 11.5 - 14.5 % Final          rosuvastatin (CRESTOR) 20 MG tablet [Pharmacy Med Name: ROSUVASTATIN 20MG  TABLETS] 90 tablet 0    Sig: TAKE 1 TABLET(20 MG) BY MOUTH DAILY     Cardiovascular:  Antilipid - Statins 2 Failed - 11/11/2022  1:13 PM      Failed - Lipid Panel in normal range within the last 12 months    Cholesterol, Total  Date Value Ref Range Status  08/11/2021 159 100 - 199 mg/dL Final   Cholesterol  Date Value Ref Range Status  08/11/2022 124 <200 mg/dL Final   LDL Cholesterol (Calc)  Date Value Ref Range Status  08/11/2022 71 mg/dL (calc) Final    Comment:    Reference  range: <100 . Desirable range <100 mg/dL for primary prevention;   <70 mg/dL for patients with CHD or diabetic patients  with > or = 2 CHD risk factors. Marland Kitchen  LDL-C is now calculated using the Martin-Hopkins  calculation, which is a validated novel method providing  better accuracy than the Friedewald equation in the  estimation of LDL-C.  Horald Pollen et al. Lenox Ahr. 4098;119(14): 2061-2068  (http://education.[employer].com/faq/FAQ164)    Direct LDL  Date Value Ref Range Status  06/20/2017 126.0 mg/dL Final    Comment:    Optimal:  <100 mg/dLNear or Above Optimal:  100-129 mg/dLBorderline High:  130-159 mg/dLHigh:  160-189 mg/dLVery High:  >190 mg/dL   HDL  Date Value Ref Range Status  08/11/2022 27 (L) > OR = 40 mg/dL Final  78/29/5621 26 (L) >39 mg/dL Final   Triglycerides  Date Value Ref Range Status  08/11/2022 183 (H) <150 mg/dL Final         Passed - Cr in normal range and within 360 days    Creat  Date Value Ref Range Status  08/11/2022 0.82 0.60 - 1.29 mg/dL Final   Creatinine, Ser  Date Value Ref Range Status  09/28/2022 0.64 0.61 - 1.24 mg/dL Final   Creatinine,U  Date Value Ref Range Status  01/18/2016 120.7 mg/dL Final   Creatinine, Urine  Date Value Ref Range Status  02/08/2022 70 20 - 320 mg/dL Final         Passed - Patient is not pregnant      Passed - Valid encounter within last 12 months    Recent Outpatient Visits           3 months ago Encounter for general adult medical examination with abnormal findings   [place] [facility] [place], Salvadore Oxford, NP   3 months ago Type 2 diabetes mellitus with hyperlipidemia [facility])   [place] [facility], Gentry Fitz A, RPH-CPP   4 months ago Type 2 diabetes mellitus with hyperlipidemia [facility])   [place] [facility] D[facility], Gentry Fitz A, RPH-CPP   6 months ago Adjustment disorder with mixed anxiety and depressed mood   [place] [facility] [place], [place] W, NP   6 months ago Type 2 diabetes mellitus with hyperlipidemia [facility])   [place] [facility] Delles, Gentry Fitz A, RPH-CPP

## 2023-04-27 ENCOUNTER — Other Ambulatory Visit: Payer: Self-pay | Admitting: Internal Medicine

## 2023-04-28 NOTE — Telephone Encounter (Signed)
 Requested medications are due for refill today.  yes  Requested medications are on the active medications list.  yes  Last refill. 09/14/2022 9mL 3 rf  Future visit scheduled.   no  Notes to clinic.  Sig needs to be updated for refill.    Requested Prescriptions  Pending Prescriptions Disp Refills   OZEMPIC, 0.25 OR 0.5 MG/DOSE, 2 MG/3ML SOPN [Pharmacy Med Name: OZEMPIC 0.25 OR 0.5MG  DOS(2MG /3ML)] 9 mL 0    Sig: INJECT 0.25 MG UNDER THE SKIN WEEKLY FOR 4 WEEKS, THEN INCREASE TO 0.5 MG WEEKLY     Endocrinology:  Diabetes - GLP-1 Receptor Agonists - semaglutide Failed - 04/28/2023 12:03 PM      Failed - HBA1C in normal range and within 180 days    HbA1c POC (<> result, manual entry)  Date Value Ref Range Status  02/08/2022 >14.0 4.0 - 5.6 % Final   Hgb A1c MFr Bld  Date Value Ref Range Status  08/11/2022 7.7 (H) <5.7 % of total Hgb Final    Comment:    For someone without known diabetes, a hemoglobin A1c value of 6.5% or greater indicates that they may have  diabetes and this should be confirmed with a follow-up  test. . For someone with known diabetes, a value <7% indicates  that their diabetes is well controlled and a value  greater than or equal to 7% indicates suboptimal  control. A1c targets should be individualized based on  duration of diabetes, age, comorbid conditions, and  other considerations. . Currently, no consensus exists regarding use of hemoglobin A1c for diagnosis of diabetes for children. .          Failed - Valid encounter within last 6 months    Recent Outpatient Visits           8 months ago Encounter for general adult medical examination with abnormal findings   [place] [facility], [place], NP   9 months ago Type 2 diabetes mellitus with hyperlipidemia [facility])   [place] [facility] D[facility], Gentry Fitz A, RPH-CPP   9 months ago Type 2 diabetes mellitus with hyperlipidemia [facility])   [place] [facility] Delles, Gentry Fitz A, RPH-CPP   11 months ago Adjustment disorder with mixed anxiety and depressed mood   [place] [facility] [place], [place], NP   1 year ago Type 2 diabetes mellitus with hyperlipidemia [facility])   [place] [facility] D[facility], Gentry Fitz A, RPH-CPP              Passed - Cr in normal range and within 360 days    Creat  Date Value Ref Range Status  08/11/2022 0.82 0.60 - 1.29 mg/dL Final   Creatinine, Ser  Date Value Ref Range Status  09/28/2022 0.64 0.61 - 1.24 mg/dL Final   Creatinine,U  Date Value Ref Range Status  01/18/2016 120.7 mg/dL Final   Creatinine, Urine  Date Value Ref Range Status  02/08/2022 70 20 - 320 mg/dL Final

## 2023-07-07 ENCOUNTER — Telehealth: Payer: Self-pay

## 2023-07-07 NOTE — Telephone Encounter (Signed)
 Copied from CRM [id number]. Topic: General - Other >> Jul 07, 2023  1:57 PM Lotus [place] B wrote: Reason for CRM: pt called in to see if Dr.[name] or her nurse can give him a call . He would like to see if he can get the name of the surgeon that amputated his leg for his insurance so the insurance can pay for it .

## 2023-07-25 ENCOUNTER — Ambulatory Visit: Admitting: Internal Medicine

## 2023-07-25 ENCOUNTER — Encounter: Payer: Self-pay | Admitting: Internal Medicine

## 2023-07-25 VITALS — BP 140/90 | Ht 71.0 in

## 2023-07-25 DIAGNOSIS — E785 Hyperlipidemia, unspecified: Secondary | ICD-10-CM

## 2023-07-25 DIAGNOSIS — L97521 Non-pressure chronic ulcer of other part of left foot limited to breakdown of skin: Secondary | ICD-10-CM

## 2023-07-25 DIAGNOSIS — I739 Peripheral vascular disease, unspecified: Secondary | ICD-10-CM | POA: Diagnosis not present

## 2023-07-25 DIAGNOSIS — K219 Gastro-esophageal reflux disease without esophagitis: Secondary | ICD-10-CM

## 2023-07-25 DIAGNOSIS — F4323 Adjustment disorder with mixed anxiety and depressed mood: Secondary | ICD-10-CM

## 2023-07-25 DIAGNOSIS — Z794 Long term (current) use of insulin: Secondary | ICD-10-CM

## 2023-07-25 DIAGNOSIS — I10 Essential (primary) hypertension: Secondary | ICD-10-CM | POA: Diagnosis not present

## 2023-07-25 DIAGNOSIS — I2510 Atherosclerotic heart disease of native coronary artery without angina pectoris: Secondary | ICD-10-CM

## 2023-07-25 DIAGNOSIS — E1169 Type 2 diabetes mellitus with other specified complication: Secondary | ICD-10-CM

## 2023-07-25 DIAGNOSIS — Z7984 Long term (current) use of oral hypoglycemic drugs: Secondary | ICD-10-CM

## 2023-07-25 DIAGNOSIS — Z23 Encounter for immunization: Secondary | ICD-10-CM

## 2023-07-25 DIAGNOSIS — E08621 Diabetes mellitus due to underlying condition with foot ulcer: Secondary | ICD-10-CM | POA: Diagnosis not present

## 2023-07-25 DIAGNOSIS — I639 Cerebral infarction, unspecified: Secondary | ICD-10-CM

## 2023-07-25 DIAGNOSIS — E11621 Type 2 diabetes mellitus with foot ulcer: Secondary | ICD-10-CM

## 2023-07-25 DIAGNOSIS — Z7985 Long-term (current) use of injectable non-insulin antidiabetic drugs: Secondary | ICD-10-CM

## 2023-07-25 DIAGNOSIS — Z89611 Acquired absence of right leg above knee: Secondary | ICD-10-CM

## 2023-07-25 DIAGNOSIS — E1165 Type 2 diabetes mellitus with hyperglycemia: Secondary | ICD-10-CM

## 2023-07-25 MED ORDER — METFORMIN HCL 1000 MG PO TABS: 500.0000 mg | ORAL_TABLET | Freq: Two times a day (BID) | ORAL | 1 refills | Status: AC

## 2023-07-25 MED ORDER — FREESTYLE LIBRE 3 PLUS SENSOR MISC: 1 refills | Status: AC

## 2023-07-25 MED ORDER — LOSARTAN POTASSIUM 50 MG PO TABS: 50.0000 mg | ORAL_TABLET | Freq: Every day | ORAL | 1 refills | Status: AC

## 2023-07-25 MED ORDER — APIXABAN 5 MG PO TABS: 5.0000 mg | ORAL_TABLET | Freq: Two times a day (BID) | ORAL | 0 refills | Status: AC

## 2023-07-25 MED ORDER — OZEMPIC (0.25 OR 0.5 MG/DOSE) 2 MG/3ML ~~LOC~~ SOPN: 0.5000 mg | PEN_INJECTOR | SUBCUTANEOUS | 1 refills | Status: AC

## 2023-07-25 MED ORDER — LANTUS SOLOSTAR 100 UNIT/ML ~~LOC~~ SOPN
16.0000 [IU] | PEN_INJECTOR | Freq: Every day | SUBCUTANEOUS | 1 refills | Status: AC
Start: 2023-07-25 — End: ?

## 2023-07-25 NOTE — Patient Instructions (Signed)

## 2023-07-25 NOTE — Assessment & Plan Note (Signed)
 Try to identify and avoid foods that trigger reflux Encourage weight loss as this can help reduce reflux symptoms Okay to take Tums OTC if needed

## 2023-07-25 NOTE — Assessment & Plan Note (Signed)
 Uncontrolled on losartan , will increase to 50 mg daily Reinforced DASH diet and exercise for weight loss C-Met today

## 2023-07-25 NOTE — Assessment & Plan Note (Signed)
 S/p RLE amputation He will continue to follow with vascular Encourage smoking cessation Consider restarting rosuvastatin  if LDL >70 Continue apixaban  and aspirin 

## 2023-07-25 NOTE — Assessment & Plan Note (Signed)
Not medicated Support offered 

## 2023-07-25 NOTE — Progress Notes (Signed)
 Subjective:    Patient ID: Martin Mullen, male    DOB: Jun 30, 1980, 43 y.o.   MRN: [id number]  HPI  Patient presents to clinic today for 65-month follow-up of chronic conditions.  HTN: His BP today is 142/92.  He is taking losartan  as prescribed.  ECG from 08/2022 reviewed.  HLD with PAD, CAD status post STEMI, history of multiple strokes: Residual cognitive effects.  His last LDL was 71, triglycerides 829, 07/2022.  He is no longer taking rosuvastatin .  He is taking aspirin  and eliquis .  He does not consume low-fat diet.  He does not follow with cardiology or neurology.  DM2: His last A1c was 7.7%, 07/2022.  He is taking metformin , semglee , novolog  and ozempic  as prescribed.  His sugars range 86-215.  He checks his left foot routinely, currently concerned about multiple open wounds. He is s/p AKA of RLE.  His last eye exam was > 1 year ago.  Flu 11/2020.  Pneumovax 06/2017.  COVID never. He does not follow with endocrinology.  GERD: He is not what triggers this. He is not taking any medications for this.  There is no upper GI on file.  Anxiety and depression: Chronic, however he is not currently taking any medications for this but was prescribed appropriate on in the past.  He is not currently seeing a therapist.  He denies SI/HI.  ED: He no longer take sildenafil .  He does not follow with urology.  Review of Systems     Past Medical History:  Diagnosis Date   Acute transmural inferior wall MI (HCC) 08/28/2012   .5 x 12 mm Veri-flex stent non-DES.   Chicken pox    Coronary artery disease    Inferior ST elevation myocardial infarction in July of 2014. Cardiac catheterization showed 95% distal RCA stenosis and 90% first diagonal stenosis with normal ejection fraction. He underwent PCI in bare-metal stent placement to the distal RCA.   Diabetes mellitus without complication (HCC)    Hypercholesterolemia    Stroke (HCC)    Tobacco use     Current Outpatient Medications  Medication Sig  Dispense Refill   Accu-Chek Softclix Lancets lancets Use to check blood sugar three times a day for type 2 diabetes E11.9 100 each 5   apixaban  (ELIQUIS ) 5 MG TABS tablet Take 1 tablet (5 mg total) by mouth 2 (two) times daily. 180 tablet 0   aspirin  EC 81 MG tablet Take 1 tablet (81 mg total) by mouth daily. 90 tablet 0   glucose blood (ACCU-CHEK GUIDE) test strip Use to check blood sugar three times a day for type 2 diabetes E11.9 100 strip 5   insulin  aspart (NOVOLOG ) 100 UNIT/ML injection Inject 0-15 Units into the skin 3 (three) times daily with meals.     insulin  glargine-yfgn (SEMGLEE ) 100 UNIT/ML injection Inject 0.05 mLs (5 Units total) into the skin daily.     losartan  (COZAAR ) 25 MG tablet Take 1 tablet (25 mg total) by mouth daily. 90 tablet 0   metFORMIN  (GLUCOPHAGE ) 1000 MG tablet TAKE 1 TABLET(1000 MG) BY MOUTH TWICE DAILY WITH A MEAL 180 tablet 0   OZEMPIC , 0.25 OR 0.5 MG/DOSE, 2 MG/3ML SOPN INJECT 0.25 MG UNDER THE SKIN WEEKLY FOR 4 WEEKS, THEN INCREASE TO 0.5 MG WEEKLY 9 mL 0   sildenafil  (VIAGRA ) 50 MG tablet TAKE 1 TABLET(50 MG) BY MOUTH DAILY AS NEEDED FOR ERECTILE DYSFUNCTION 10 tablet 0   No current facility-administered medications for this visit.  No Known Allergies  Family History  Problem Relation Age of Onset   Arthritis Mother        Rheumatoid   Diabetes Mother    Hyperlipidemia Mother    Diabetes Father    Cancer Maternal Grandfather        Prostate   Parkinson's disease Maternal Grandfather     Social History   Socioeconomic History   Marital status: Divorced    Spouse name: Not on file   Number of children: 2   Years of education: Not on file   Highest education level: Not on file  Occupational History   Not on file  Tobacco Use   Smoking status: Every Day    Current packs/day: 1.00    Average packs/day: 1 pack/day for 30.0 years (30.0 ttl pk-yrs)    Types: Cigarettes   Smokeless tobacco: Never  Vaping Use   Vaping status: Never Used   Substance and Sexual Activity   Alcohol use: Not Currently    Alcohol/week: 0.0 standard drinks of alcohol    Comment: occasional   Drug use: Not Currently    Types: Marijuana   Sexual activity: Yes    Birth control/protection: None  Other Topics Concern   Not on file  Social History Narrative   Lives with girlfriend and her mother    Social Drivers of Health   Financial Resource Strain: Low Risk  (06/10/2022)   Overall Financial Resource Strain (CARDIA)    Difficulty of Paying Living Expenses: Not very hard  Food Insecurity: No Food Insecurity (09/22/2022)   Hunger Vital Sign    Worried About Running Out of Food in the Last Year: Never true    Ran Out of Food in the Last Year: Never true  Transportation Needs: No Transportation Needs (09/22/2022)   PRAPARE - Administrator, Civil Service (Medical): No    Lack of Transportation (Non-Medical): No  Physical Activity: Not on file  Stress: Not on file  Social Connections: Not on file  Intimate Partner Violence: Not At Risk (09/22/2022)   Humiliation, Afraid, Rape, and Kick questionnaire    Fear of Current or Ex-Partner: No    Emotionally Abused: No    Physically Abused: No    Sexually Abused: No     Constitutional: Denies fever, malaise, fatigue, headache or abrupt weight changes.  HEENT: Denies eye pain, eye redness, ear pain, ringing in the ears, wax buildup, runny nose, nasal congestion, bloody nose, or sore throat. Respiratory: Denies difficulty breathing, shortness of breath, cough or sputum production.   Cardiovascular: Denies chest pain, chest tightness, palpitations or swelling in the hands or feet.  Gastrointestinal: Denies abdominal pain, bloating, constipation, diarrhea or blood in the stool.  GU: Pt reports erectile dysfunction. Denies urgency, frequency, pain with urination, burning sensation, blood in urine, odor or discharge. Musculoskeletal: Pt reports difficulty with gait. Denies decrease in range of  motion, difficulty with gait, muscle pain or joint swelling.  Skin: Patient reports wound to left foot.  Denies redness, rashes, lesions or ulcercations.  Neurological: Denies dizziness, difficulty with memory, difficulty with speech or problems with balance and coordination.  Psych: Patient has a history of anxiety and depression.  Denies SI/HI.  No other specific complaints in a complete review of systems (except as listed in HPI above).  Objective:   Physical Exam BP (!) 140/90   Ht 5\' 11"  (1.803 m)   BMI 23.58 kg/m    Wt Readings from Last 3 Encounters:  09/20/22 169 lb 1.5 oz (76.7 kg)  08/11/22 190 lb (86.2 kg)  06/30/22 203 lb 14.8 oz (92.5 kg)    General: Appears older than his stated age, chronically ill appearing, in NAD. Skin: Warm, dry and intact. Multiple ulcerated open areas to left foot.  HEENT: Head: normal shape and size; Eyes: sclera white, no icterus, conjunctiva pink, PERRLA and EOMs intact;  Cardiovascular: Normal rate and rhythm. S1,S2 noted.  No murmur, rubs or gallops noted. No JVD or LLE edema.  Pulmonary/Chest: Normal effort and positive vesicular breath sounds. No respiratory distress. No wheezes, rales or ronchi noted.  Abdomen: Soft and nontender. Normal bowel sounds.  Musculoskeletal: Right AKA, in wheelchair today Neurological: Alert and oriented.  Chronic expressive asphasia. Psychiatric: Mood and affect flat. Behavior is normal. Judgment and thought content normal.    BMET    Component Value Date/Time   NA 133 (L) 09/28/2022 0513   NA 140 08/11/2021 0937   NA 139 01/29/2013 0040   K 4.1 09/28/2022 0513   K 4.0 01/29/2013 0040   CL 96 (L) 09/28/2022 0513   CL 107 01/29/2013 0040   CO2 28 09/28/2022 0513   CO2 28 01/29/2013 0040   GLUCOSE 181 (H) 09/28/2022 0513   GLUCOSE 228 (H) 01/29/2013 0040   BUN 10 09/28/2022 0513   BUN 11 08/11/2021 0937   BUN 15 01/29/2013 0040   CREATININE 0.64 09/28/2022 0513   CREATININE 0.82 08/11/2022 1455    CALCIUM  8.9 09/28/2022 0513   CALCIUM  9.2 01/29/2013 0040   GFRNONAA >60 09/28/2022 0513   GFRNONAA >60 01/29/2013 0040   GFRAA 104 11/13/2019 1550   GFRAA >60 01/29/2013 0040    Lipid Panel     Component Value Date/Time   CHOL 124 08/11/2022 1455   CHOL 159 08/11/2021 0937   TRIG 183 (H) 08/11/2022 1455   HDL 27 (L) 08/11/2022 1455   HDL 26 (L) 08/11/2021 0937   CHOLHDL 4.6 08/11/2022 1455   VLDL 22 09/12/2021 0419   LDLCALC 71 08/11/2022 1455    CBC    Component Value Date/Time   WBC 8.3 09/28/2022 0513   RBC 3.72 (L) 09/28/2022 0513   HGB 11.2 (L) 09/28/2022 0513   HGB 17.9 (H) 08/11/2021 0937   HCT 33.7 (L) 09/28/2022 0513   HCT 51.0 08/11/2021 0937   PLT 596 (H) 09/28/2022 0513   PLT 261 08/11/2021 0937   MCV 90.6 09/28/2022 0513   MCV 93 08/11/2021 0937   MCV 93 01/29/2013 0040   MCH 30.1 09/28/2022 0513   MCHC 33.2 09/28/2022 0513   RDW 13.2 09/28/2022 0513   RDW 12.4 08/11/2021 0937   RDW 13.8 01/29/2013 0040   LYMPHSABS 1.8 09/16/2022 1618   LYMPHSABS 4.8 (H) 08/11/2021 0937   MONOABS 0.7 09/16/2022 1618   EOSABS 0.1 09/16/2022 1618   EOSABS 0.5 (H) 08/11/2021 0937   BASOSABS 0.1 09/16/2022 1618   BASOSABS 0.1 08/11/2021 0937    Hgb A1C Lab Results  Component Value Date   HGBA1C 7.7 (H) 08/11/2022           Assessment & Plan:    RTC in 3 months for your annual exam Helayne Lo, NP

## 2023-07-25 NOTE — Assessment & Plan Note (Signed)
Wheelchair bound 

## 2023-07-25 NOTE — Assessment & Plan Note (Signed)
 No recent angina C-Met and lipid profile today Continue aspirin  and eliquis  Will consider restarting rosuvastatin  based on labs

## 2023-07-25 NOTE — Assessment & Plan Note (Signed)
 C-Met and lipid profile today Continue aspirin  and eliquis  Consider restarting rosuvastatin  based on LDL levels Encourage smoking cessation

## 2023-07-25 NOTE — Assessment & Plan Note (Signed)
 Referral to podiatry placed for further evaluation and treatment.

## 2023-07-25 NOTE — Assessment & Plan Note (Signed)
 A1c and urine microalbumin today Encouraged to consume a low-carb diet and exercise for weight loss Continue Lantus  to 16 units daily Continue metformin  500 mg twice daily Increase Ozempic  to 0.5 mg weekly Encouraged him to obtain his diabetic eye exam Encouraged routine eye exam Encouraged him to get a flu shot in the fall Prevnar today Encouraged him to get a COVID-vaccine

## 2023-07-25 NOTE — Assessment & Plan Note (Signed)
 C-Met and lipid profile today Encouraged him to consume low-fat diet Will consider restarting rosuvastatin  if LDL >70

## 2023-07-26 ENCOUNTER — Ambulatory Visit: Payer: Self-pay | Admitting: Internal Medicine

## 2023-08-07 ENCOUNTER — Ambulatory Visit: Admitting: Podiatry

## 2023-08-22 ENCOUNTER — Ambulatory Visit: Admitting: Podiatry

## 2023-10-02 ENCOUNTER — Encounter: Payer: Self-pay | Admitting: Internal Medicine

## 2023-10-02 NOTE — Telephone Encounter (Signed)
Ok for requested letter?

## 2023-10-17 ENCOUNTER — Other Ambulatory Visit: Payer: Self-pay | Admitting: Internal Medicine

## 2023-10-17 ENCOUNTER — Ambulatory Visit (INDEPENDENT_AMBULATORY_CARE_PROVIDER_SITE_OTHER): Admitting: Podiatry

## 2023-10-17 DIAGNOSIS — L0889 Other specified local infections of the skin and subcutaneous tissue: Secondary | ICD-10-CM | POA: Diagnosis not present

## 2023-10-17 MED ORDER — DOXYCYCLINE HYCLATE 100 MG PO TABS: 100.0000 mg | ORAL_TABLET | Freq: Two times a day (BID) | ORAL | 0 refills | Status: DC

## 2023-10-17 NOTE — Progress Notes (Unsigned)
 Subjective:  Patient ID: Martin Mullen, male    DOB: November 21, 1980,  MRN: [id number]  Chief Complaint  Patient presents with   Foot Pain    Left foot swelling and red     43 y.o. male presents with the above complaint.  Patient presents with left dorsal midfoot bug bite.  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  Denies any other acute complaints he has not seen me in quite some time.  Just wants to make sure that everything will heal fine on the left side pain scale 0 out of 10 dull aching nature denies any other acute issues   Review of Systems: Negative except as noted in the HPI. Denies N/V/F/Ch.  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   Chicken pox    Coronary artery disease    Inferior ST elevation myocardial infarction in July of 2014. Cardiac catheterization showed 95% distal RCA stenosis and 90% first diagonal stenosis with normal ejection fraction. He underwent PCI in bare-metal stent placement to the distal RCA.   Diabetes mellitus without complication (HCC)    Hypercholesterolemia    Stroke (HCC)    Tobacco use     Current Outpatient Medications:    buPROPion  (WELLBUTRIN  XL) 150 MG 24 hr tablet, Take 150 mg by mouth., Disp: , Rfl:    doxycycline  (VIBRA -TABS) 100 MG tablet, Take 1 tablet (100 mg total) by mouth 2 (two) times daily., Disp: 20 tablet, Rfl: 0   methocarbamol  (ROBAXIN ) 500 MG tablet, , Disp: , Rfl:    rosuvastatin  (CRESTOR ) 20 MG tablet, Take 1 tablet by mouth daily., Disp: , Rfl:    Accu-Chek Softclix Lancets lancets, Use to check blood sugar three times a day for type 2 diabetes E11.9, Disp: 100 each, Rfl: 5   apixaban  (ELIQUIS ) 5 MG TABS tablet, Take 1 tablet (5 mg total) by mouth 2 (two) times daily., Disp: 180 tablet, Rfl: 0   aspirin  EC 81 MG tablet, Take 1 tablet (81 mg total) by mouth daily.,  Disp: 90 tablet, Rfl: 0   Continuous Glucose Sensor (FREESTYLE LIBRE 3 PLUS SENSOR) MISC, Change sensor every 15 days., Disp: 6 each, Rfl: 1   glucose blood (ACCU-CHEK GUIDE) test strip, Use to check blood sugar three times a day for type 2 diabetes E11.9, Disp: 100 strip, Rfl: 5   insulin  glargine (LANTUS  SOLOSTAR) 100 UNIT/ML Solostar Pen, Inject 16 Units into the skin daily., Disp: 15 mL, Rfl: 1   losartan  (COZAAR ) 50 MG tablet, Take 1 tablet (50 mg total) by mouth daily., Disp: 90 tablet, Rfl: 1   metFORMIN  (GLUCOPHAGE ) 1000 MG tablet, Take 0.5 tablets (500 mg total) by mouth 2 (two) times daily with a meal., Disp: 180 tablet, Rfl: 1   Omega-3 Fatty Acids (FISH OIL ) 1200 MG CAPS, Take 1,200 mg by mouth., Disp: , Rfl:    OZEMPIC , 0.25 OR 0.5 MG/DOSE, 2 MG/3ML SOPN, Inject 0.5 mg into the skin once a week., Disp: 9 mL, Rfl: 1   sildenafil  (VIAGRA ) 50 MG tablet, TAKE 1 TABLET(50 MG) BY MOUTH DAILY AS NEEDED FOR ERECTILE DYSFUNCTION, Disp: 10 tablet, Rfl: 2  Social History   Tobacco Use  Smoking Status Every Day   Current packs/day: 1.00   Average packs/day: 1 pack/day for 30.0 years (30.0 ttl pk-yrs)   Types:  Cigarettes  Smokeless Tobacco Never    No Known Allergies Objective:  There were no vitals filed for this visit. There is no height or weight on file to calculate BMI. Constitutional Well developed. Well nourished.  Vascular Dorsalis pedis pulses palpable left side Posterior tibial pulses palpable left side Capillary refill normal to all any digits.  No cyanosis or clubbing noted. Pedal hair growth normal.  Neurologic Normal speech. Oriented to person, place, and time. Epicritic sensation to light touch grossly present left side  Dermatologic Left dorsal midfoot superficial bug bite.  Clinically reepithelialized mild erythema noted.  No cellulitis or purulent drainage noted no signs of infection noted.  Orthopedic: Normal joint ROM without pain or crepitus left side No  visible deformities. No bony tenderness.   Radiographs: None Assessment:   1. Other specified local infections of the skin and subcutaneous tissue    Plan:  Patient was evaluated and treated and all questions answered.  Left dorsal midfoot superficial bug bite without ulceration - All questions and concerns were discussed with the patient in extensive detail at this time of my clinical concern for any type of ulcer formation.  Appears to be very superficial in nature.  For now I will just place him on doxycycline  to for skin and soft tissue prophylaxis.  If any foot and ankle issues in the future he will come back and see me. - Doxycycline  was dispensed  No follow-ups on file.

## 2023-10-27 ENCOUNTER — Encounter: Admitting: Internal Medicine

## 2023-10-27 NOTE — Progress Notes (Deleted)
 Subjective:    Patient ID: Martin Mullen, male    DOB: 1980-06-04, 43 y.o.   MRN: [id number]  HPI  Patient presents to clinic today for his annual exam.  Flu: 10/2022 Tetanus: 06/2012 Pneumovax: 06/2017 Prevnar 20: 07/2023 COVID: Never Vision screening: as needed Dentist: no teeth  Diet: He does not eat meat. He consumes some fruits and veggies. He tries to avoid fried foods. He drinks mostly gatorade Exercise: None  Review of Systems     Past Medical History:  Diagnosis Date   Acute transmural inferior wall MI (HCC) 08/28/2012   .5 x 12 mm Veri-flex stent non-DES.   Chicken pox    Coronary artery disease    Inferior ST elevation myocardial infarction in July of 2014. Cardiac catheterization showed 95% distal RCA stenosis and 90% first diagonal stenosis with normal ejection fraction. He underwent PCI in bare-metal stent placement to the distal RCA.   Diabetes mellitus without complication (HCC)    Hypercholesterolemia    Stroke (HCC)    Tobacco use     Current Outpatient Medications  Medication Sig Dispense Refill   Accu-Chek Softclix Lancets lancets Use to check blood sugar three times a day for type 2 diabetes E11.9 100 each 5   apixaban  (ELIQUIS ) 5 MG TABS tablet Take 1 tablet (5 mg total) by mouth 2 (two) times daily. 180 tablet 0   aspirin  EC 81 MG tablet Take 1 tablet (81 mg total) by mouth daily. 90 tablet 0   buPROPion  (WELLBUTRIN  XL) 150 MG 24 hr tablet Take 150 mg by mouth.     Continuous Glucose Sensor (FREESTYLE LIBRE 3 PLUS SENSOR) MISC Change sensor every 15 days. 6 each 1   doxycycline  (VIBRA -TABS) 100 MG tablet Take 1 tablet (100 mg total) by mouth 2 (two) times daily. 20 tablet 0   glucose blood (ACCU-CHEK GUIDE) test strip Use to check blood sugar three times a day for type 2 diabetes E11.9 100 strip 5   insulin  glargine (LANTUS  SOLOSTAR) 100 UNIT/ML Solostar Pen Inject 16 Units into the skin daily. 15 mL 1   losartan  (COZAAR ) 50 MG tablet Take 1 tablet  (50 mg total) by mouth daily. 90 tablet 1   metFORMIN  (GLUCOPHAGE ) 1000 MG tablet Take 0.5 tablets (500 mg total) by mouth 2 (two) times daily with a meal. 180 tablet 1   methocarbamol  (ROBAXIN ) 500 MG tablet      Omega-3 Fatty Acids (FISH OIL ) 1200 MG CAPS Take 1,200 mg by mouth.     OZEMPIC , 0.25 OR 0.5 MG/DOSE, 2 MG/3ML SOPN Inject 0.5 mg into the skin once a week. 9 mL 1   rosuvastatin  (CRESTOR ) 20 MG tablet Take 1 tablet by mouth daily.     sildenafil  (VIAGRA ) 50 MG tablet TAKE 1 TABLET(50 MG) BY MOUTH DAILY AS NEEDED FOR ERECTILE DYSFUNCTION 10 tablet 2   No current facility-administered medications for this visit.    No Known Allergies  Family History  Problem Relation Age of Onset   Arthritis Mother        Rheumatoid   Diabetes Mother    Hyperlipidemia Mother    Diabetes Father    Cancer Maternal Grandfather        Prostate   Parkinson's disease Maternal Grandfather     Social History   Socioeconomic History   Marital status: Divorced    Spouse name: Not on file   Number of children: 2   Years of education: Not on file  Highest education level: Not on file  Occupational History   Not on file  Tobacco Use   Smoking status: Every Day    Current packs/day: 1.00    Average packs/day: 1 pack/day for 30.0 years (30.0 ttl pk-yrs)    Types: Cigarettes   Smokeless tobacco: Never  Vaping Use   Vaping status: Never Used  Substance and Sexual Activity   Alcohol use: Not Currently    Alcohol/week: 0.0 standard drinks of alcohol    Comment: occasional   Drug use: Not Currently    Types: Marijuana   Sexual activity: Yes    Birth control/protection: None  Other Topics Concern   Not on file  Social History Narrative   Lives with girlfriend and her mother    Social Drivers of Health   Financial Resource Strain: Low Risk  (06/10/2022)   Overall Financial Resource Strain (CARDIA)    Difficulty of Paying Living Expenses: Not very hard  Food Insecurity: No Food  Insecurity (09/22/2022)   Hunger Vital Sign    Worried About Running Out of Food in the Last Year: Never true    Ran Out of Food in the Last Year: Never true  Transportation Needs: No Transportation Needs (09/22/2022)   PRAPARE - Administrator, Civil Service (Medical): No    Lack of Transportation (Non-Medical): No  Physical Activity: Not on file  Stress: Not on file  Social Connections: Not on file  Intimate Partner Violence: Not At Risk (09/22/2022)   Humiliation, Afraid, Rape, and Kick questionnaire    Fear of Current or Ex-Partner: No    Emotionally Abused: No    Physically Abused: No    Sexually Abused: No     Constitutional: Denies fever, malaise, fatigue, headache or abrupt weight changes.  HEENT: Denies eye pain, eye redness, ear pain, ringing in the ears, wax buildup, runny nose, nasal congestion, bloody nose, or sore throat. Respiratory: Denies difficulty breathing, shortness of breath, cough or sputum production.   Cardiovascular: Denies chest pain, chest tightness, palpitations or swelling in the hands or feet.  Gastrointestinal: Denies abdominal pain, bloating, constipation, diarrhea or blood in the stool.  GU: Denies urgency, frequency, pain with urination, burning sensation, blood in urine, odor or discharge. Musculoskeletal: Patient reports difficulty with gait.  Denies decrease in range of motion, muscle pain or joint pain and swelling.  Skin: Denies redness, rashes, lesions or ulcercations.  Neurological: Patient reports difficulty with memory.  Denies dizziness, difficulty with speech or problems with balance and coordination.  Psych: Patient has a history of anxiety and depression.  Denies SI/HI.  No other specific complaints in a complete review of systems (except as listed in HPI above).  Objective:   Physical Exam   There were no vitals taken for this visit.  Wt Readings from Last 3 Encounters:  09/20/22 169 lb 1.5 oz (76.7 kg)  08/11/22 190 lb  (86.2 kg)  06/30/22 203 lb 14.8 oz (92.5 kg)    General: Appears older than his stated age, overweight, chronically ill-appearing, in NAD. Skin:  HEENT: Head: normal shape and size; Eyes: sclera white, no icterus, conjunctiva pink, PERRLA and EOMs intact; Ears: Tm's gray and intact, normal light reflex;  Neck:  Neck supple, trachea midline. No masses, lumps or thyromegaly present.  Cardiovascular: Tachycardic with normal rhythm. S1,S2 noted.  No murmur, rubs or gallops noted. No JVD or BLE edema.  Pulmonary/Chest: Normal effort and positive vesicular breath sounds. No respiratory distress. No wheezes, rales or  ronchi noted.  Abdomen: Soft and nontender. Normal bowel sounds.  Musculoskeletal:  Neurological: Alert and oriented. Psychiatric: Mood and affect normal. Behavior is normal. Judgment and thought content normal.    BMET    Component Value Date/Time   NA 139 07/25/2023 1523   NA 140 08/11/2021 0937   NA 139 01/29/2013 0040   K 4.3 07/25/2023 1523   K 4.0 01/29/2013 0040   CL 101 07/25/2023 1523   CL 107 01/29/2013 0040   CO2 29 07/25/2023 1523   CO2 28 01/29/2013 0040   GLUCOSE 145 (H) 07/25/2023 1523   GLUCOSE 228 (H) 01/29/2013 0040   BUN 14 07/25/2023 1523   BUN 11 08/11/2021 0937   BUN 15 01/29/2013 0040   CREATININE 0.79 07/25/2023 1523   CALCIUM  9.2 07/25/2023 1523   CALCIUM  9.2 01/29/2013 0040   GFRNONAA >60 09/28/2022 0513   GFRNONAA >60 01/29/2013 0040   GFRAA 104 11/13/2019 1550   GFRAA >60 01/29/2013 0040    Lipid Panel     Component Value Date/Time   CHOL 122 07/25/2023 1523   CHOL 159 08/11/2021 0937   TRIG 113 07/25/2023 1523   HDL 38 (L) 07/25/2023 1523   HDL 26 (L) 08/11/2021 0937   CHOLHDL 3.2 07/25/2023 1523   VLDL 22 09/12/2021 0419   LDLCALC 64 07/25/2023 1523    CBC    Component Value Date/Time   WBC 11.2 (H) 07/25/2023 1523   RBC 5.06 07/25/2023 1523   HGB 16.4 07/25/2023 1523   HGB 17.9 (H) 08/11/2021 0937   HCT 49.2 07/25/2023  1523   HCT 51.0 08/11/2021 0937   PLT 257 07/25/2023 1523   PLT 261 08/11/2021 0937   MCV 97.2 07/25/2023 1523   MCV 93 08/11/2021 0937   MCV 93 01/29/2013 0040   MCH 32.4 07/25/2023 1523   MCHC 33.3 07/25/2023 1523   RDW 12.7 07/25/2023 1523   RDW 12.4 08/11/2021 0937   RDW 13.8 01/29/2013 0040   LYMPHSABS 1.8 09/16/2022 1618   LYMPHSABS 4.8 (H) 08/11/2021 0937   MONOABS 0.7 09/16/2022 1618   EOSABS 0.1 09/16/2022 1618   EOSABS 0.5 (H) 08/11/2021 0937   BASOSABS 0.1 09/16/2022 1618   BASOSABS 0.1 08/11/2021 0937    Hgb A1C Lab Results  Component Value Date   HGBA1C 6.3 (H) 07/25/2023           Assessment & Plan:   Preventative health maintenance:  Encouraged him to get a flu shot in the fall He declines tetanus today Encouraged him to get his COVID-vaccine Pneumovax and prevnar 20 UTD Encouraged him to consume a balanced diet and exercise regimen Advised him to see an eye doctor and dentist annually We will check CBC, c-Met, lipid, A1c today  RTC in 6 months, follow-up chronic conditions [name], NP

## 2023-11-17 ENCOUNTER — Encounter: Payer: Self-pay | Admitting: Internal Medicine

## 2023-11-17 ENCOUNTER — Ambulatory Visit (INDEPENDENT_AMBULATORY_CARE_PROVIDER_SITE_OTHER): Admitting: Internal Medicine

## 2023-11-17 VITALS — BP 130/78 | Ht 71.0 in | Wt 185.0 lb

## 2023-11-17 DIAGNOSIS — E663 Overweight: Secondary | ICD-10-CM

## 2023-11-17 DIAGNOSIS — Z23 Encounter for immunization: Secondary | ICD-10-CM | POA: Diagnosis not present

## 2023-11-17 DIAGNOSIS — Z7985 Long-term (current) use of injectable non-insulin antidiabetic drugs: Secondary | ICD-10-CM

## 2023-11-17 DIAGNOSIS — Z0001 Encounter for general adult medical examination with abnormal findings: Secondary | ICD-10-CM | POA: Diagnosis not present

## 2023-11-17 DIAGNOSIS — Z7984 Long term (current) use of oral hypoglycemic drugs: Secondary | ICD-10-CM

## 2023-11-17 DIAGNOSIS — E1142 Type 2 diabetes mellitus with diabetic polyneuropathy: Secondary | ICD-10-CM | POA: Diagnosis not present

## 2023-11-17 DIAGNOSIS — Z794 Long term (current) use of insulin: Secondary | ICD-10-CM

## 2023-11-17 DIAGNOSIS — Z6825 Body mass index (BMI) 25.0-25.9, adult: Secondary | ICD-10-CM

## 2023-11-17 NOTE — Assessment & Plan Note (Signed)
 Encouraged diet for weight loss Unable to exercise d/t amputee status

## 2023-11-17 NOTE — Progress Notes (Signed)
 Subjective:    Patient ID: Martin Mullen, male    DOB: 1981-01-12, 43 y.o.   MRN: [id number]  HPI  Patient presents to clinic today for his annual exam.  Flu: 10/2022 Tetanus: 06/2012 Pneumovax: 06/2017 Prevnar 20: 07/2023 COVID: Never Vision screening: as needed Dentist: no teeth  Diet: He does not eat meat. He consumes some fruits and veggies. He tries to avoid fried foods. He drinks mostly gatorade Exercise: None  Review of Systems     Past Medical History:  Diagnosis Date  . 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  SABRA Chicken pox   . Coronary artery disease    Inferior ST elevation myocardial infarction in July of 2014. Cardiac catheterization showed 95% distal RCA stenosis and 90% first diagonal stenosis with normal ejection fraction. He underwent PCI in bare-metal stent placement to the distal RCA.  . Diabetes mellitus without complication (HCC)   . Hypercholesterolemia   . Stroke (HCC)   . Tobacco use     Current Outpatient Medications  Medication Sig Dispense Refill  . Accu-Chek Softclix Lancets lancets Use to check blood sugar three times a day for type 2 diabetes E11.9 100 each 5  . apixaban  (ELIQUIS ) 5 MG TABS tablet Take 1 tablet (5 mg total) by mouth 2 (two) times daily. 180 tablet 0  . aspirin  EC 81 MG tablet Take 1 tablet (81 mg total) by mouth daily. 90 tablet 0  . buPROPion  (WELLBUTRIN  XL) 150 MG 24 hr tablet Take 150 mg by mouth.    . Continuous Glucose Sensor (FREESTYLE LIBRE 3 PLUS SENSOR) MISC Change sensor every 15 days. 6 each 1  . doxycycline  (VIBRA -TABS) 100 MG tablet Take 1 tablet (100 mg total) by mouth 2 (two) times daily. 20 tablet 0  . glucose blood (ACCU-CHEK GUIDE) test strip Use to check blood sugar three times a day for type 2 diabetes E11.9 100 strip 5  . insulin  glargine (LANTUS  SOLOSTAR) 100 UNIT/ML Solostar Pen Inject 16 Units into the skin daily. 15 mL 1  . losartan  (COZAAR ) 50 MG  tablet Take 1 tablet (50 mg total) by mouth daily. 90 tablet 1  . metFORMIN  (GLUCOPHAGE ) 1000 MG tablet Take 0.5 tablets (500 mg total) by mouth 2 (two) times daily with a meal. 180 tablet 1  . methocarbamol  (ROBAXIN ) 500 MG tablet     . Omega-3 Fatty Acids (FISH OIL ) 1200 MG CAPS Take 1,200 mg by mouth.    . OZEMPIC , 0.25 OR 0.5 MG/DOSE, 2 MG/3ML SOPN Inject 0.5 mg into the skin once a week. 9 mL 1  . rosuvastatin  (CRESTOR ) 20 MG tablet Take 1 tablet by mouth daily.    . sildenafil  (VIAGRA ) 50 MG tablet TAKE 1 TABLET(50 MG) BY MOUTH DAILY AS NEEDED FOR ERECTILE DYSFUNCTION 10 tablet 2   No current facility-administered medications for this visit.    No Known Allergies  Family History  Problem Relation Age of Onset  . Arthritis Mother        Rheumatoid  . Diabetes Mother   . Hyperlipidemia Mother   . Diabetes Father   . Cancer Maternal Grandfather        Prostate  . Parkinson's disease Maternal Grandfather     Social History   Socioeconomic History  . Marital status: Divorced    Spouse name: Not on file  . Number of children: 2  . Years of education: Not on file  .  Highest education level: Not on file  Occupational History  . Not on file  Tobacco Use  . Smoking status: Every Day    Current packs/day: 1.00    Average packs/day: 1 pack/day for 30.0 years (30.0 ttl pk-yrs)    Types: Cigarettes  . Smokeless tobacco: Never  Vaping Use  . Vaping status: Never Used  Substance and Sexual Activity  . Alcohol use: Not Currently    Alcohol/week: 0.0 standard drinks of alcohol    Comment: occasional  . Drug use: Not Currently    Types: Marijuana  . Sexual activity: Yes    Birth control/protection: None  Other Topics Concern  . Not on file  Social History Narrative   Lives with girlfriend and her mother    Social Drivers of Health   Financial Resource Strain: Low Risk  (06/10/2022)   Overall Financial Resource Strain (CARDIA)   . Difficulty of Paying Living Expenses: Not  very hard  Food Insecurity: No Food Insecurity (09/22/2022)   Hunger Vital Sign   . Worried About Programme researcher, broadcasting/film/video in the Last Year: Never true   . Ran Out of Food in the Last Year: Never true  Transportation Needs: No Transportation Needs (09/22/2022)   PRAPARE - Transportation   . Lack of Transportation (Medical): No   . Lack of Transportation (Non-Medical): No  Physical Activity: Not on file  Stress: Not on file  Social Connections: Not on file  Intimate Partner Violence: Not At Risk (09/22/2022)   Humiliation, Afraid, Rape, and Kick questionnaire   . Fear of Current or Ex-Partner: No   . Emotionally Abused: No   . Physically Abused: No   . Sexually Abused: No     Constitutional: Denies fever, malaise, fatigue, headache or abrupt weight changes.  HEENT: Denies eye pain, eye redness, ear pain, ringing in the ears, wax buildup, runny nose, nasal congestion, bloody nose, or sore throat. Respiratory: Denies difficulty breathing, shortness of breath, cough or sputum production.   Cardiovascular: Denies chest pain, chest tightness, palpitations or swelling in the hands or feet.  Gastrointestinal: Denies abdominal pain, bloating, constipation, diarrhea or blood in the stool.  GU: Denies urgency, frequency, pain with urination, burning sensation, blood in urine, odor or discharge. Musculoskeletal: Patient reports difficulty with gait.  Denies decrease in range of motion, muscle pain or joint pain and swelling.  Skin: Denies redness, rashes, lesions or ulcercations.  Neurological: Patient reports difficulty with memory, neuropathy of LLE.  Denies dizziness, difficulty with speech or problems with balance and coordination.  Psych: Patient has a history of anxiety and depression.  Denies SI/HI.  No other specific complaints in a complete review of systems (except as listed in HPI above).  Objective:   Physical Exam  BP 130/78 (BP Location: Right Arm, Patient Position: Sitting, Cuff Size:  Normal)   Ht 5' 11 (1.803 m)   Wt 185 lb (83.9 kg)   BMI 25.80 kg/m    Wt Readings from Last 3 Encounters:  09/20/22 169 lb 1.5 oz (76.7 kg)  08/11/22 190 lb (86.2 kg)  06/30/22 203 lb 14.8 oz (92.5 kg)    General: Appears older than his stated age, overweight, chronically ill-appearing, in NAD. Skin: Warm, dry and intact. No ulcerations. HEENT: Head: normal shape and size; Eyes: sclera white, no icterus, conjunctiva pink, PERRLA and EOMs intact; Ears: Tm's gray and intact, normal light reflex;  Neck:  Neck supple, trachea midline. No masses, lumps or thyromegaly present.  Cardiovascular: Normal rate  and rhythm. S1,S2 noted.  No murmur, rubs or gallops noted. No JVD. Trace pitting BLE edema.  Pulmonary/Chest: Normal effort and positive vesicular breath sounds. No respiratory distress. No wheezes, rales or ronchi noted.  Abdomen: Soft and nontender. Normal bowel sounds.  Musculoskeletal: Absence of RLE above knee. In wheelchair today. Neurological: Alert and oriented. Psychiatric: Mood and affect normal. Behavior is normal. Judgment and thought content normal.    BMET    Component Value Date/Time   NA 139 07/25/2023 1523   NA 140 08/11/2021 0937   NA 139 01/29/2013 0040   K 4.3 07/25/2023 1523   K 4.0 01/29/2013 0040   CL 101 07/25/2023 1523   CL 107 01/29/2013 0040   CO2 29 07/25/2023 1523   CO2 28 01/29/2013 0040   GLUCOSE 145 (H) 07/25/2023 1523   GLUCOSE 228 (H) 01/29/2013 0040   BUN 14 07/25/2023 1523   BUN 11 08/11/2021 0937   BUN 15 01/29/2013 0040   CREATININE 0.79 07/25/2023 1523   CALCIUM  9.2 07/25/2023 1523   CALCIUM  9.2 01/29/2013 0040   GFRNONAA >60 09/28/2022 0513   GFRNONAA >60 01/29/2013 0040   GFRAA 104 11/13/2019 1550   GFRAA >60 01/29/2013 0040    Lipid Panel     Component Value Date/Time   CHOL 122 07/25/2023 1523   CHOL 159 08/11/2021 0937   TRIG 113 07/25/2023 1523   HDL 38 (L) 07/25/2023 1523   HDL 26 (L) 08/11/2021 0937   CHOLHDL 3.2  07/25/2023 1523   VLDL 22 09/12/2021 0419   LDLCALC 64 07/25/2023 1523    CBC    Component Value Date/Time   WBC 11.2 (H) 07/25/2023 1523   RBC 5.06 07/25/2023 1523   HGB 16.4 07/25/2023 1523   HGB 17.9 (H) 08/11/2021 0937   HCT 49.2 07/25/2023 1523   HCT 51.0 08/11/2021 0937   PLT 257 07/25/2023 1523   PLT 261 08/11/2021 0937   MCV 97.2 07/25/2023 1523   MCV 93 08/11/2021 0937   MCV 93 01/29/2013 0040   MCH 32.4 07/25/2023 1523   MCHC 33.3 07/25/2023 1523   RDW 12.7 07/25/2023 1523   RDW 12.4 08/11/2021 0937   RDW 13.8 01/29/2013 0040   LYMPHSABS 1.8 09/16/2022 1618   LYMPHSABS 4.8 (H) 08/11/2021 0937   MONOABS 0.7 09/16/2022 1618   EOSABS 0.1 09/16/2022 1618   EOSABS 0.5 (H) 08/11/2021 0937   BASOSABS 0.1 09/16/2022 1618   BASOSABS 0.1 08/11/2021 0937    Hgb A1C Lab Results  Component Value Date   HGBA1C 6.3 (H) 07/25/2023           Assessment & Plan:   Preventative health maintenance:  Flu shot today Tetanus today Encouraged him to get his COVID-vaccine Pneumovax and prevnar 20 UTD Encouraged him to consume a balanced diet and exercise regimen Advised him to see an eye doctor and dentist annually We will check CBC, c-Met, lipid, A1c today  RTC in 6 months, follow-up chronic conditions [name], NP

## 2023-11-17 NOTE — Patient Instructions (Signed)
 Health Maintenance, Male  Adopting a healthy lifestyle and getting preventive care are important in promoting health and wellness. Ask your health care provider about:  The right schedule for you to have regular tests and exams.  Things you can do on your own to prevent diseases and keep yourself healthy.  What should I know about diet, weight, and exercise?  Eat a healthy diet    Eat a diet that includes plenty of vegetables, fruits, low-fat dairy products, and lean protein.  Do not eat a lot of foods that are high in solid fats, added sugars, or sodium.  Maintain a healthy weight  Body mass index (BMI) is a measurement that can be used to identify possible weight problems. It estimates body fat based on height and weight. Your health care provider can help determine your BMI and help you achieve or maintain a healthy weight.  Get regular exercise  Get regular exercise. This is one of the most important things you can do for your health. Most adults should:  Exercise for at least 150 minutes each week. The exercise should increase your heart rate and make you sweat (moderate-intensity exercise).  Do strengthening exercises at least twice a week. This is in addition to the moderate-intensity exercise.  Spend less time sitting. Even light physical activity can be beneficial.  Watch cholesterol and blood lipids  Have your blood tested for lipids and cholesterol at 43 years of age, then have this test every 5 years.  You may need to have your cholesterol levels checked more often if:  Your lipid or cholesterol levels are high.  You are older than 43 years of age.  You are at high risk for heart disease.  What should I know about cancer screening?  Many types of cancers can be detected early and may often be prevented. Depending on your health history and family history, you may need to have cancer screening at various ages. This may include screening for:  Colorectal cancer.  Prostate cancer.  Skin cancer.  Lung  cancer.  What should I know about heart disease, diabetes, and high blood pressure?  Blood pressure and heart disease  High blood pressure causes heart disease and increases the risk of stroke. This is more likely to develop in people who have high blood pressure readings or are overweight.  Talk with your health care provider about your target blood pressure readings.  Have your blood pressure checked:  Every 3-5 years if you are 24-52 years of age.  Every year if you are 3 years old or older.  If you are between the ages of 60 and 72 and are a current or former smoker, ask your health care provider if you should have a one-time screening for abdominal aortic aneurysm (AAA).  Diabetes  Have regular diabetes screenings. This checks your fasting blood sugar level. Have the screening done:  Once every three years after age 66 if you are at a normal weight and have a low risk for diabetes.  More often and at a younger age if you are overweight or have a high risk for diabetes.  What should I know about preventing infection?  Hepatitis B  If you have a higher risk for hepatitis B, you should be screened for this virus. Talk with your health care provider to find out if you are at risk for hepatitis B infection.  Hepatitis C  Blood testing is recommended for:  Everyone born from [birth year] through 1965.  Anyone  with known risk factors for hepatitis C.  Sexually transmitted infections (STIs)  You should be screened each year for STIs, including gonorrhea and chlamydia, if:  You are sexually active and are younger than 43 years of age.  You are older than 43 years of age and your health care provider tells you that you are at risk for this type of infection.  Your sexual activity has changed since you were last screened, and you are at increased risk for chlamydia or gonorrhea. Ask your health care provider if you are at risk.  Ask your health care provider about whether you are at high risk for HIV. Your health care provider  may recommend a prescription medicine to help prevent HIV infection. If you choose to take medicine to prevent HIV, you should first get tested for HIV. You should then be tested every 3 months for as long as you are taking the medicine.  Follow these instructions at home:  Alcohol use  Do not drink alcohol if your health care provider tells you not to drink.  If you drink alcohol:  Limit how much you have to 0-2 drinks a day.  Know how much alcohol is in your drink. In the [place] one drink equals one 12 oz bottle of beer (355 mL), one 5 oz glass of wine (148 mL), or one 1 oz glass of hard liquor (44 mL).  Lifestyle  Do not use any products that contain nicotine or tobacco. These products include cigarettes, chewing tobacco, and vaping devices, such as e-cigarettes. If you need help quitting, ask your health care provider.  Do not use street drugs.  Do not share needles.  Ask your health care provider for help if you need support or information about quitting drugs.  General instructions  Schedule regular health, dental, and eye exams.  Stay current with your vaccines.  Tell your health care provider if:  You often feel depressed.  You have ever been abused or do not feel safe at home.  Summary  Adopting a healthy lifestyle and getting preventive care are important in promoting health and wellness.  Follow your health care provider's instructions about healthy diet, exercising, and getting tested or screened for diseases.  Follow your health care provider's instructions on monitoring your cholesterol and blood pressure.  This information is not intended to replace advice given to you by your health care provider. Make sure you discuss any questions you have with your health care provider.  Document Revised: 06/22/2020 Document Reviewed: 06/22/2020  Elsevier Patient Education  2024 [employer].

## 2023-11-17 NOTE — Addendum Note (Signed)
 Addended by: ZELIA GAUZE D on: 11/17/2023 01:34 PM   Modules accepted: Orders

## 2023-11-18 LAB — CBC
HCT: 46.7 % (ref 38.5–50.0)
Hemoglobin: 15.3 g/dL (ref 13.2–17.1)
MCH: 32.1 pg (ref 27.0–33.0)
MCHC: 32.8 g/dL (ref 32.0–36.0)
MCV: 98.1 fL (ref 80.0–100.0)
MPV: 10.5 fL (ref 7.5–12.5)
Platelets: 239 Thousand/uL (ref 140–400)
RBC: 4.76 Million/uL (ref 4.20–5.80)
RDW: 13.1 % (ref 11.0–15.0)
WBC: 11.9 Thousand/uL — ABNORMAL HIGH (ref 3.8–10.8)

## 2023-11-18 LAB — COMPREHENSIVE METABOLIC PANEL WITH GFR
AG Ratio: 1.4 (calc) (ref 1.0–2.5)
ALT: 15 U/L (ref 9–46)
AST: 12 U/L (ref 10–40)
Albumin: 4 g/dL (ref 3.6–5.1)
Alkaline phosphatase (APISO): 114 U/L (ref 36–130)
BUN: 14 mg/dL (ref 7–25)
CO2: 28 mmol/L (ref 20–32)
Calcium: 9.7 mg/dL (ref 8.6–10.3)
Chloride: 103 mmol/L (ref 98–110)
Creat: 0.79 mg/dL (ref 0.60–1.29)
Globulin: 2.8 g/dL (ref 1.9–3.7)
Glucose, Bld: 141 mg/dL — ABNORMAL HIGH (ref 65–99)
Potassium: 4.8 mmol/L (ref 3.5–5.3)
Sodium: 140 mmol/L (ref 135–146)
Total Bilirubin: 0.4 mg/dL (ref 0.2–1.2)
Total Protein: 6.8 g/dL (ref 6.1–8.1)
eGFR: 113 mL/min/1.73m2 (ref >=60)

## 2023-11-18 LAB — LIPID PANEL
Cholesterol: 113 mg/dL (ref <200)
HDL: 36 mg/dL — ABNORMAL LOW (ref >=40)
LDL Cholesterol (Calc): 55 mg/dL
Non-HDL Cholesterol (Calc): 77 mg/dL (ref <130)
Total CHOL/HDL Ratio: 3.1 (calc) (ref <5.0)
Triglycerides: 135 mg/dL (ref <150)

## 2023-11-18 LAB — HEMOGLOBIN A1C
Hgb A1c MFr Bld: 6.8 % — ABNORMAL HIGH (ref <5.7)
Mean Plasma Glucose: 148 mg/dL
eAG (mmol/L): 8.2 mmol/L

## 2023-11-20 ENCOUNTER — Ambulatory Visit: Payer: Self-pay | Admitting: Internal Medicine

## 2023-12-01 ENCOUNTER — Encounter: Payer: Self-pay | Admitting: Internal Medicine

## 2023-12-22 LAB — CBC
HCT: 49.2 % (ref 38.5–50.0)
Hemoglobin: 16.4 g/dL (ref 13.2–17.1)
MCH: 32.4 pg (ref 27.0–33.0)
MCHC: 33.3 g/dL (ref 32.0–36.0)
MCV: 97.2 fL (ref 80.0–100.0)
MPV: 10.4 fL (ref 7.5–12.5)
Platelets: 257 Thousand/uL (ref 140–400)
RBC: 5.06 Million/uL (ref 4.20–5.80)
RDW: 12.7 % (ref 11.0–15.0)
WBC: 11.2 Thousand/uL — ABNORMAL HIGH (ref 3.8–10.8)

## 2023-12-22 LAB — LIPID PANEL
Cholesterol: 122 mg/dL (ref <200)
HDL: 38 mg/dL — ABNORMAL LOW (ref >=40)
LDL Cholesterol (Calc): 64 mg/dL
Non-HDL Cholesterol (Calc): 84 mg/dL (ref <130)
Total CHOL/HDL Ratio: 3.2 (calc) (ref <5.0)
Triglycerides: 113 mg/dL (ref <150)

## 2023-12-22 LAB — COMPREHENSIVE METABOLIC PANEL WITH GFR
AG Ratio: 1.7 (calc) (ref 1.0–2.5)
ALT: 15 U/L (ref 9–46)
AST: 10 U/L (ref 10–40)
Albumin: 4 g/dL (ref 3.6–5.1)
Alkaline phosphatase (APISO): 99 U/L (ref 36–130)
BUN: 14 mg/dL (ref 7–25)
CO2: 29 mmol/L (ref 20–32)
Calcium: 9.2 mg/dL (ref 8.6–10.3)
Chloride: 101 mmol/L (ref 98–110)
Creat: 0.79 mg/dL (ref 0.60–1.29)
Globulin: 2.4 g/dL (ref 1.9–3.7)
Glucose, Bld: 145 mg/dL — ABNORMAL HIGH (ref 65–139)
Potassium: 4.3 mmol/L (ref 3.5–5.3)
Sodium: 139 mmol/L (ref 135–146)
Total Bilirubin: 0.3 mg/dL (ref 0.2–1.2)
Total Protein: 6.4 g/dL (ref 6.1–8.1)
eGFR: 113 mL/min/1.73m2 (ref >=60)

## 2023-12-22 LAB — MICROALBUMIN / CREATININE URINE RATIO
Creatinine, Urine: 66 mg/dL (ref 20–320)
Microalb Creat Ratio: 939 mg/g{creat} — ABNORMAL HIGH (ref <30)
Microalb, Ur: 62 mg/dL

## 2023-12-22 LAB — HEMOGLOBIN A1C
Hgb A1c MFr Bld: 6.3 % — ABNORMAL HIGH (ref <5.7)
Mean Plasma Glucose: 134 mg/dL
eAG (mmol/L): 7.4 mmol/L

## 2023-12-25 ENCOUNTER — Encounter: Payer: Self-pay | Admitting: Pharmacist

## 2023-12-25 ENCOUNTER — Other Ambulatory Visit: Admitting: Pharmacist

## 2023-12-25 ENCOUNTER — Other Ambulatory Visit: Payer: Self-pay | Admitting: Pharmacist

## 2023-12-25 ENCOUNTER — Other Ambulatory Visit (HOSPITAL_COMMUNITY): Payer: Self-pay

## 2023-12-25 ENCOUNTER — Telehealth: Payer: Self-pay

## 2023-12-25 DIAGNOSIS — Z7985 Long-term (current) use of injectable non-insulin antidiabetic drugs: Secondary | ICD-10-CM

## 2023-12-25 DIAGNOSIS — Z7984 Long term (current) use of oral hypoglycemic drugs: Secondary | ICD-10-CM

## 2023-12-25 DIAGNOSIS — E1169 Type 2 diabetes mellitus with other specified complication: Secondary | ICD-10-CM

## 2023-12-25 DIAGNOSIS — Z794 Long term (current) use of insulin: Secondary | ICD-10-CM

## 2023-12-25 DIAGNOSIS — E1142 Type 2 diabetes mellitus with diabetic polyneuropathy: Secondary | ICD-10-CM

## 2023-12-25 MED ORDER — ROSUVASTATIN CALCIUM 20 MG PO TABS: 20.0000 mg | ORAL_TABLET | Freq: Every day | ORAL | 0 refills | Status: AC

## 2023-12-25 MED ORDER — ACCU-CHEK GUIDE ME W/DEVICE KIT: PACK | 0 refills | Status: AC

## 2023-12-25 MED ORDER — ACCU-CHEK GUIDE TEST VI STRP: ORAL_STRIP | 3 refills | Status: AC

## 2023-12-25 MED ORDER — ACCU-CHEK SOFTCLIX LANCETS MISC: 3 refills | Status: AC

## 2023-12-25 MED ORDER — INSULIN PEN NEEDLE 31G X 5 MM MISC: 1 refills | Status: AC

## 2023-12-25 NOTE — Telephone Encounter (Signed)
 Hi Elisabeth,   I know that you have been involved with this patient in the past.  It appears his Medicaid got discontinued.  Do I need to put him on Coumadin in place of Eliquis ?

## 2023-12-25 NOTE — Patient Instructions (Signed)
Goals Addressed             This Visit's Progress    Pharmacy Goals       Our goal A1c is less than 7%. This corresponds with fasting sugars less than 130 and 2 hour after meal sugars less than 180. Please keep a log of your results when checking your blood sugar   Our goal bad cholesterol, or LDL, is less than 70 . This is why it is important to continue taking your rosuvastatin.  Check your blood pressure twice weekly, and any time you have concerning symptoms like headache, chest pain, dizziness, shortness of breath, or vision changes.   Our goal is less than 130/80.  To appropriately check your blood pressure, make sure you do the following:  1) Avoid caffeine, exercise, or tobacco products for 30 minutes before checking. Empty your bladder. 2) Sit with your back supported in a flat-backed chair. Rest your arm on something flat (arm of the chair, table, etc). 3) Sit still with your feet flat on the floor, resting, for at least 5 minutes.  4) Check your blood pressure. Take 1-2 readings.  5) Write down these readings and bring with you to any provider appointments.  Bring your home blood pressure machine with you to a provider's office for accuracy comparison at least once a year.   Make sure you take your blood pressure medications before you come to any office visit, even if you were asked to fast for labs.   Estelle Grumbles, PharmD, [facility] Clinical Pharmacist [facility] [phone]

## 2023-12-25 NOTE — Addendum Note (Signed)
 Addended by: [name] ORN on: 12/25/2023 11:26 AM   Modules accepted: Orders

## 2023-12-25 NOTE — Progress Notes (Signed)
 12/25/2023 Name: Martin Mullen MRN: [id number] DOB: 1980/10/24  Chief Complaint  Patient presents with   Medication Assistance    Martin Mullen is a 43 y.o. year old male who presented for a telephone visit.   They were referred to the pharmacist by their PCP for assistance in managing medication access.   Note previously engaged with patient for assistance in managing diabetes and medication access in 2024, but was unable to maintain contact with patient   Subjective:  Care Team: Primary Care Provider: [name] ORN, NP ; Next Scheduled Visit: 05/14/2024   Medication Access/Adherence  Current Pharmacy:  [id number] [place], [place] - [address] AT [facility] [address] [place] [address] Phone: [phone] Fax: [phone]  [name] [address] [place] [address] Phone: [phone] Fax: [phone]  [facility] [address] [place] [address] Phone: [phone] Fax: [phone]   Patient reports affordability concerns with their medications: Yes  Patient reports access/transportation concerns to their pharmacy: No  Patient reports adherence concerns with their medications:  No    Today patient reports that he received a letter in the mail that his Medicaid ended. However, states does not recall date that letter stated coverage expected to end and is not sure if this referred to his current Medicaid plan or overall Medicaid eligibility  Reports ran out of several of his medications ~1 week ago when was unable to pick up from pharmacy as refills did not go through his insurance, including Eliquis , metformin  and Ozempic . Patient also states that he is in need of a prescription for pen needles to use with his Lantus    Diabetes:   Current medications:  - metformin  1000 mg - 1/2 tablet (500 mg) twice daily -  currently out of medication  - Lantus  16 units daily - currently out of medication  - Ozempic  0.5 mg weekly - currently out of medication    Medications tried in the past/contraindications: glipizide , Januvia ; note would avoid SGLT2-inhibitor as patient with history of Fournier gangrene   Reports has not checked home blood sugar in ~1 week as ran out of testing supplies - Recalls prior to running out of testing supplies and his above medications, morning fasting blood sugar readings running ~120s - From review of chart, note prescription for Freestyle Libre 3 Plus CGM sensors sent to pharmacy for patient in June, but today patient states that he never started using these as they were not approved through his insurance  Patient denies hypoglycemic s/sx including dizziness, shakiness, sweating        Hyperlipidemia/ASCVD Risk Reduction   Current lipid lowering medications: rosuvastatin  20 mg daily   Antiplatelet regimen: aspirin  81 mg (note patient also on Eliquis  5 mg twice daily)   ASCVD History: medical history significant of STEMI s/p RCA stent 2014, hypertension, recurrent strokes, heterozygous prothrombin gene mutation on Eliquis , insulin -dependent type 2 diabetes  Risk Factors: tobacco use     History of Heterozygous Prothrombin Gene Mutation:  Current medications: Eliquis  5 mg twice daily - reports last taken ~1 week ago        Objective:  Lab Results  Component Value Date   HGBA1C 6.8 (H) 11/17/2023    Lab Results  Component Value Date   CREATININE 0.79 11/17/2023   BUN 14 11/17/2023   NA 140 11/17/2023   K 4.8 11/17/2023  CL 103 11/17/2023   CO2 28 11/17/2023    Lab Results  Component Value Date   CHOL 113 11/17/2023   HDL 36 (L) 11/17/2023   LDLCALC 55 11/17/2023   LDLDIRECT 126.0 06/20/2017   TRIG 135 11/17/2023   CHOLHDL 3.1 11/17/2023    Medications Reviewed Today     Reviewed by Alana Sharyle LABOR, RPH-CPP (Pharmacist) on 12/25/23 at 1304   Med List Status: <None>   Medication Order Taking? Sig Documenting Provider Last Dose Status Informant  Accu-Chek Softclix Lancets lancets [id number]  Use to check blood sugar three times a day for type 2 diabetes E11.9 [name] ORN, NP  Active   apixaban  (ELIQUIS ) 5 MG TABS tablet [id number]  Take 1 tablet (5 mg total) by mouth 2 (two) times daily. [name] ORN, NP  Active   aspirin  EC 81 MG tablet [id number]  Take 1 tablet (81 mg total) by mouth daily. [name] ORN, NP  Active Self  Blood Glucose Monitoring Suppl (ACCU-CHEK GUIDE ME) w/Device KIT [id number] Yes Use to check blood sugar three times a day [name], Regina W, NP  Active   buPROPion  (WELLBUTRIN  XL) 150 MG 24 hr tablet [id number]  Take 150 mg by mouth. [provider]  Active   Continuous Glucose Sensor (FREESTYLE LIBRE 3 PLUS SENSOR) MISC [id number]  Change sensor every 15 days. [name] ORN, NP  Active   glucose blood (ACCU-CHEK GUIDE TEST) test strip [id number] Yes Use to check blood sugar three times a day [name] ORN, NP  Active   insulin  glargine (LANTUS  SOLOSTAR) 100 UNIT/ML Solostar Pen [id number]  Inject 16 Units into the skin daily. [name] ORN, NP  Active   Insulin  Pen Needle 31G X 5 MM MISC [id number]  BD Pen Needles- brand specific Inject insulin  via insulin  pen 6 x daily [name] ORN, NP  Active   losartan  (COZAAR ) 50 MG tablet [id number]  Take 1 tablet (50 mg total) by mouth daily. [name] ORN, NP  Active   metFORMIN  (GLUCOPHAGE ) 1000 MG tablet [id number]  Take 0.5 tablets (500 mg total) by mouth 2 (two) times daily with a meal. [name] ORN, NP  Active   methocarbamol  (ROBAXIN ) 500 MG tablet [id number]   [provider]  Active   Omega-3 Fatty Acids (FISH OIL ) 1200 MG CAPS [id number]  Take 1,200 mg by mouth. [provider]  Active   OZEMPIC , 0.25 OR 0.5 MG/DOSE, 2 MG/3ML SOPN [id number]  Inject 0.5 mg into the skin once a week. [name] ORN, NP  Active   rosuvastatin  (CRESTOR )  20 MG tablet [id number]  Take 1 tablet (20 mg total) by mouth daily. [name] ORN, NP  Active   sildenafil  (VIAGRA ) 50 MG tablet [id number]  TAKE 1 TABLET(50 MG) BY MOUTH DAILY AS NEEDED FOR ERECTILE DYSFUNCTION [name] ORN, NP  Active               Assessment/Plan:   Collaborate with [facility] and [employer]. Find that alternative billing information is available for Medicaid prescription coverage for patient. Provide this to [facility] today and Eliquis  prescription successfully runs through insurance plan - Follow up with patient to provide this update. Current plan information for [facility] [employer] [facility]  Patient to review current supplies of his medications and contact [facility] to pick up refills of his prescriptions including Eliquis , metformin , Ozempic , losartan   Patient to follow up with [place] [employer] for clarification regarding the status  of his Medicaid coverage/request copy of his new insurance card  Identify patient in need of renewal of his rosuvastatin  prescription (current prescription is expired) and prescription for insulin  pen needles (to use with Lantus  pen) - Send request for new prescriptions to PCP. Provider sends new prescriptions for both to pharmacy for patient  Diabetes: - Have reviewed dietary modifications including working toward having regular well-balanced meals spread throughout the day, while controlling carbohydrate portion sizes - Have counseled patient on s/s of low blood sugar and how to treat lows - Identify that patients prescriptions for Freestyle Libre 3 Plus sensors and Ozempic  are both in need of prior authorization - Submitted PA requests for both on behalf of patient via CoverMyMeds.   PA for Freestyle Libre 3 Plus sensors approved through 06/22/2024   PA for Ozempic  approved through 12/24/2024  Provide update to [facility] and request pharmacy re-run both prescriptions - Send  patient MyChart message with Freestyle Libre 3 setup videos from manufacturer website and advise him to reach out to clinical pharmacist, pharmacy or office with any questions prior to starting use of this device Provide patient with phone number for [employer] at [phone]. Their customer support is available 7 days a week 8 am to 8 pm - Send new prescriptions for Accu-Chek Guide testing supplies to [employer] for patient - Recommend to continue to start Freestyle Libre 3 CGM to monitor blood sugar/as feedback on dietary choices Recommend to check glucose with fingerstick check when needed for symptoms and as back up to CGM.  Patient to contact office if needed for readings outside of established parameters or symptoms  History of Heterozygous Prothrombin Gene Mutation: - Reviewed importance of adherence to anticoagulant       Follow Up Plan: Clinical Pharmacist will follow up with patient by telephone on 01/01/2024 at 8:00 AM    Sharyle Sia, PharmD, [facility] Clinical Pharmacist [facility] [phone]

## 2023-12-25 NOTE — Telephone Encounter (Signed)
 Pharmacy Patient Advocate Encounter  Insurance verification completed.   The patient is insured through [employer]   Ran test claim for Eliquis . Currently a quantity of 60 is a 30 day supply and the co-pay is $4.00 .   This test claim was processed through [facility]- copay amounts may vary at other pharmacies due to pharmacy/plan contracts, or as the patient moves through the different stages of their insurance plan.

## 2023-12-29 ENCOUNTER — Encounter: Payer: Self-pay | Admitting: Internal Medicine

## 2023-12-29 ENCOUNTER — Telehealth (INDEPENDENT_AMBULATORY_CARE_PROVIDER_SITE_OTHER): Admitting: Internal Medicine

## 2023-12-29 DIAGNOSIS — Z89611 Acquired absence of right leg above knee: Secondary | ICD-10-CM

## 2023-12-29 DIAGNOSIS — F4323 Adjustment disorder with mixed anxiety and depressed mood: Secondary | ICD-10-CM

## 2023-12-29 DIAGNOSIS — I739 Peripheral vascular disease, unspecified: Secondary | ICD-10-CM

## 2023-12-29 MED ORDER — SERTRALINE HCL 50 MG PO TABS: ORAL_TABLET | ORAL | 0 refills | Status: AC

## 2023-12-29 NOTE — Progress Notes (Signed)
 Virtual Visit via Video Note  I connected with Martin Mullen on 12/29/23 at 11:20 AM EST by a video enabled telemedicine application and verified that I am speaking with the correct person using two identifiers.  Location: Patient: Home Provider: Office  Person's participating in this video call: [name], NP-C and Martin Mullen   I discussed the limitations of evaluation and management by telemedicine and the availability of in person appointments. The patient expressed understanding and agreed to proceed.  History of Present Illness:   Discussed the use of AI scribe software for clinical note transcription with the patient, who gave verbal consent to proceed.   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  He is currently living in a hotel after leaving an assisted living facility due to financial reasons.  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  He has a history of DM 2, CAD, HTN and recurrent strokes.  He is anxious about his right leg amputation and is worried about learning to use a prosthesis. He has not yet started working with a prosthesis but has seen a specialist who is in the process of making it, expected to be ready in two to three weeks.  He feels depressed and has a history of being on Wellbutrin  for anxiety and depression, but he is not currently taking it. He wants a medication that might be more effective than Wellbutrin .  He has been prescribed citalopram  in the past but he does not recall taking this.  He is not attending therapy sessions and does not feel the need to see a therapist at this time.  He denies SI/HI.     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   Chicken pox    Coronary artery disease    Inferior ST elevation  myocardial infarction in July of 2014. Cardiac catheterization showed 95% distal RCA stenosis and 90% first diagonal stenosis with normal ejection fraction. He underwent PCI in bare-metal stent placement to the distal RCA.   Diabetes mellitus without complication (HCC)    Hypercholesterolemia    Stroke (HCC)    Tobacco use     Current Outpatient Medications  Medication Sig Dispense Refill   Accu-Chek Softclix Lancets lancets Use to check blood sugar three times a day for type 2 diabetes E11.9 200 each 3   apixaban  (ELIQUIS ) 5 MG TABS tablet Take 1 tablet (5 mg total) by mouth 2 (two) times daily. 180 tablet 0   aspirin  EC 81 MG tablet Take 1 tablet (81 mg total) by mouth daily. 90 tablet 0   Blood Glucose Monitoring Suppl (ACCU-CHEK GUIDE ME) w/Device KIT Use to check blood sugar three times a day 1 kit 0   buPROPion  (WELLBUTRIN  XL) 150 MG 24 hr tablet Take 150 mg by mouth.     Continuous Glucose Sensor (FREESTYLE LIBRE 3 PLUS SENSOR) MISC Change sensor every 15 days. 6 each 1   glucose blood (ACCU-CHEK GUIDE TEST) test strip Use to check blood sugar three times a day 200 each 3   insulin  glargine (LANTUS  SOLOSTAR) 100 UNIT/ML Solostar Pen Inject 16 Units into the skin daily. 15 mL 1   Insulin  Pen Needle 31G X 5 MM MISC BD Pen Needles- brand specific Inject insulin  via  insulin  pen 6 x daily 100 each 1   losartan  (COZAAR ) 50 MG tablet Take 1 tablet (50 mg total) by mouth daily. 90 tablet 1   metFORMIN  (GLUCOPHAGE ) 1000 MG tablet Take 0.5 tablets (500 mg total) by mouth 2 (two) times daily with a meal. 180 tablet 1   methocarbamol  (ROBAXIN ) 500 MG tablet      Omega-3 Fatty Acids (FISH OIL ) 1200 MG CAPS Take 1,200 mg by mouth.     OZEMPIC , 0.25 OR 0.5 MG/DOSE, 2 MG/3ML SOPN Inject 0.5 mg into the skin once a week. 9 mL 1   rosuvastatin  (CRESTOR ) 20 MG tablet Take 1 tablet (20 mg total) by mouth daily. 90 tablet 0   sildenafil  (VIAGRA ) 50 MG tablet TAKE 1 TABLET(50 MG) BY MOUTH DAILY AS NEEDED  FOR ERECTILE DYSFUNCTION 10 tablet 2   No current facility-administered medications for this visit.    No Known Allergies  Family History  Problem Relation Age of Onset   Arthritis Mother        Rheumatoid   Diabetes Mother    Hyperlipidemia Mother    Diabetes Father    Cancer Maternal Grandfather        Prostate   Parkinson's disease Maternal Grandfather     Social History   Socioeconomic History   Marital status: Divorced    Spouse name: Not on file   Number of children: 2   Years of education: Not on file   Highest education level: Not on file  Occupational History   Not on file  Tobacco Use   Smoking status: Every Day    Current packs/day: 1.00    Average packs/day: 1 pack/day for 30.0 years (30.0 ttl pk-yrs)    Types: Cigarettes   Smokeless tobacco: Never  Vaping Use   Vaping status: Never Used  Substance and Sexual Activity   Alcohol use: Not Currently    Alcohol/week: 0.0 standard drinks of alcohol    Comment: occasional   Drug use: Not Currently    Types: Marijuana   Sexual activity: Yes    Birth control/protection: None  Other Topics Concern   Not on file  Social History Narrative   Lives with girlfriend and her mother    Social Drivers of Health   Financial Resource Strain: Low Risk  (06/10/2022)   Overall Financial Resource Strain (CARDIA)    Difficulty of Paying Living Expenses: Not very hard  Food Insecurity: No Food Insecurity (09/22/2022)   Hunger Vital Sign    Worried About Running Out of Food in the Last Year: Never true    Ran Out of Food in the Last Year: Never true  Transportation Needs: No Transportation Needs (09/22/2022)   PRAPARE - Administrator, Civil Service (Medical): No    Lack of Transportation (Non-Medical): No  Physical Activity: Not on file  Stress: Not on file  Social Connections: Not on file  Intimate Partner Violence: Not At Risk (09/22/2022)   Humiliation, Afraid, Rape, and Kick questionnaire    Fear of  Current or Ex-Partner: No    Emotionally Abused: No    Physically Abused: No    Sexually Abused: No     Constitutional: Denies fever, malaise, fatigue, headache or abrupt weight changes.  HEENT: Denies eye pain, eye redness, ear pain, ringing in the ears, wax buildup, runny nose, nasal congestion, bloody nose, or sore throat. Respiratory: Denies difficulty breathing, shortness of breath, cough or sputum production.   Cardiovascular: Denies chest pain, chest  tightness, palpitations or swelling in the hands or feet.  Gastrointestinal: Denies abdominal pain, bloating, constipation, diarrhea or blood in the stool.  GU: Denies urgency, frequency, pain with urination, burning sensation, blood in urine, odor or discharge. Musculoskeletal: Patient reports absence of RLE, difficulty with gait.  Denies decrease in range of motion, muscle pain or joint pain and swelling.  Skin: Denies redness, rashes, lesions or ulcercations.  Neurological: Patient reports difficulty with speech, problems with balance.  Denies dizziness, difficulty with memory,  or problems with coordination.  Psych: Patient has a history of anxiety and depression.  Denies SI/HI.  No other specific complaints in a complete review of systems (except as listed in HPI above).  Observations/Objective:   Wt Readings from Last 3 Encounters:  11/17/23 185 lb (83.9 kg)  09/20/22 169 lb 1.5 oz (76.7 kg)  08/11/22 190 lb (86.2 kg)    General: Appears older than stated age, chronically ill-appearing, in NAD. Pulmonary/Chest: Normal effort. No respiratory distress.  Neurological: Alert and oriented.  Difficulty with speech (expressive aphasia). Psychiatric: Mood and affect normal.  Anxious appearing.  Judgment and thought content normal.    BMET    Component Value Date/Time   NA 140 11/17/2023 1324   NA 140 08/11/2021 0937   NA 139 01/29/2013 0040   K 4.8 11/17/2023 1324   K 4.0 01/29/2013 0040   CL 103 11/17/2023 1324   CL 107  01/29/2013 0040   CO2 28 11/17/2023 1324   CO2 28 01/29/2013 0040   GLUCOSE 141 (H) 11/17/2023 1324   GLUCOSE 228 (H) 01/29/2013 0040   BUN 14 11/17/2023 1324   BUN 11 08/11/2021 0937   BUN 15 01/29/2013 0040   CREATININE 0.79 11/17/2023 1324   CALCIUM  9.7 11/17/2023 1324   CALCIUM  9.2 01/29/2013 0040   GFRNONAA >60 09/28/2022 0513   GFRNONAA >60 01/29/2013 0040   GFRAA 104 11/13/2019 1550   GFRAA >60 01/29/2013 0040    Lipid Panel     Component Value Date/Time   CHOL 113 11/17/2023 1324   CHOL 159 08/11/2021 0937   TRIG 135 11/17/2023 1324   HDL 36 (L) 11/17/2023 1324   HDL 26 (L) 08/11/2021 0937   CHOLHDL 3.1 11/17/2023 1324   VLDL 22 09/12/2021 0419   LDLCALC 55 11/17/2023 1324    CBC    Component Value Date/Time   WBC 11.9 (H) 11/17/2023 1324   RBC 4.76 11/17/2023 1324   HGB 15.3 11/17/2023 1324   HGB 17.9 (H) 08/11/2021 0937   HCT 46.7 11/17/2023 1324   HCT 51.0 08/11/2021 0937   PLT 239 11/17/2023 1324   PLT 261 08/11/2021 0937   MCV 98.1 11/17/2023 1324   MCV 93 08/11/2021 0937   MCV 93 01/29/2013 0040   MCH 32.1 11/17/2023 1324   MCHC 32.8 11/17/2023 1324   RDW 13.1 11/17/2023 1324   RDW 12.4 08/11/2021 0937   RDW 13.8 01/29/2013 0040   LYMPHSABS 1.8 09/16/2022 1618   LYMPHSABS 4.8 (H) 08/11/2021 0937   MONOABS 0.7 09/16/2022 1618   EOSABS 0.1 09/16/2022 1618   EOSABS 0.5 (H) 08/11/2021 0937   BASOSABS 0.1 09/16/2022 1618   BASOSABS 0.1 08/11/2021 0937    Hgb A1C Lab Results  Component Value Date   HGBA1C 6.8 (H) 11/17/2023       Assessment and Plan:  Assessment and Plan    Adjustment reaction with anxiety and depressive, 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 Previously on Wellbutrin , seeking more effective treatment. More depressed than anxious. - Prescribed sertraline, start with half tablet daily for two weeks, then full tablet daily. -He declines referral for therapy at  this time - Advised to report back in six to eight weeks on medication efficacy.     AKA Currently in the process of having a prosthesis made - He will continue to work with PT and his prosthesis  RTC in 5 months for follow-up of chronic conditions Follow Up Instructions:    I discussed the assessment and treatment plan with the patient. The patient was provided an opportunity to ask questions and all were answered. The patient agreed with the plan and demonstrated an understanding of the instructions.   The patient was advised to call back or seek an in-person evaluation if the symptoms worsen or if the condition fails to improve as anticipated.   [name], NP

## 2023-12-29 NOTE — Patient Instructions (Signed)

## 2024-01-01 ENCOUNTER — Telehealth: Payer: Self-pay | Admitting: Pharmacist

## 2024-01-01 ENCOUNTER — Other Ambulatory Visit

## 2024-01-01 NOTE — Progress Notes (Unsigned)
   Outreach Note  01/01/2024 Name: TREVYON SWOR MRN: [id number] DOB: Dec 11, 1980  Referred by: [name] ORN, NP  Was unable to reach patient via telephone today and have left HIPAA compliant voicemail asking patient to return my call.   Follow Up Plan: Will collaborate with Care Guide to outreach to schedule follow up with me  Sharyle Sia, PharmD, [facility] Clinical Pharmacist [facility] [phone]

## 2024-01-02 ENCOUNTER — Other Ambulatory Visit (HOSPITAL_COMMUNITY): Payer: Self-pay

## 2024-01-02 ENCOUNTER — Telehealth: Payer: Self-pay | Admitting: Pharmacy Technician

## 2024-01-02 NOTE — Telephone Encounter (Signed)
 Pharmacy Patient Advocate Encounter   Received notification from Onbase that prior authorization for Ozempic  (0.25 or 0.5 MG/DOSE) 2MG /3ML pen-injectors is required/requested.   Insurance verification completed.   The patient is insured through [place] [employer].   Per test claim: The current 84 day co-pay is, $4.00.  No PA needed at this time. This test claim was processed through [facility]- copay amounts may vary at other pharmacies due to pharmacy/plan contracts, or as the patient moves through the different stages of their insurance plan.    PA already on file that does not expire until 12/24/2024.

## 2024-01-03 ENCOUNTER — Other Ambulatory Visit: Admitting: Pharmacist

## 2024-01-03 NOTE — Progress Notes (Signed)
 01/03/2024 Name: Martin Mullen MRN: [id number] DOB: February 10, 1981  Chief Complaint  Patient presents with   Medication Adherence   Medication Management    Martin Mullen is a 43 y.o. year old male who presented for a telephone visit.   They were referred to the pharmacist by their PCP for assistance in managing medication access.      Subjective:   Care Team: Primary Care Provider: [name] ORN, NP ; Next Scheduled Visit: 05/14/2024    Medication Access/Adherence  Current Pharmacy:  [facility] #[id number] [address], [place] - [address] AT [facility] [address] [place] [address] Phone: [phone] Fax: [phone]  [name] [address] [place] [address] Phone: [phone] Fax: [phone]  [facility] - [facility] [address] [place] [address] Phone: [phone] Fax: [phone]   Patient reports affordability concerns with their medications: Yes  Patient reports access/transportation concerns to their pharmacy: No  Patient reports adherence concerns with their medications:  No     From review of chart, note patient seen of Virtual Visit with PCP on 12/29/2023. Provider advised him to start sertraline 50 mg - 1/2 tablet (25 mg total) by mouth daily for 14 days, THEN 1 tablet (50 mg total) daily.  - Today patient reports has not yet picked this up, but plans to pick up and start taking as directed   Diabetes:   Current medications:  - metformin  1000 mg - 1/2 tablet (500 mg) twice daily - restarted ~1 week ago - Lantus  16 units daily - restarted ~1 week ago - Ozempic  0.5 mg weekly - currently out of medication  Note prior authorization was approved through 12/24/2024 & [facility] notified on 12/25/2023   Medications tried in the past/contraindications: glipizide , Januvia ; note would avoid SGLT2-inhibitor as patient with  history of Fournier gangrene   Denies checking home blood sugar since we last spoke  Reports picked up, but has not yet started using Freestyle Libre continuous glucose monitoring as forgot his password for the App  Reports picked up Accu Chek test strips and lancets, but did not yet pick up the meter   Patient denies hypoglycemic s/sx including dizziness, shakiness, sweating         Hyperlipidemia/ASCVD Risk Reduction   Current lipid lowering medications: rosuvastatin  20 mg daily - not yet restarted   Antiplatelet regimen: aspirin  81 mg (note patient also on Eliquis  5 mg twice daily)   ASCVD History: medical history significant of STEMI s/p RCA stent 2014, hypertension, recurrent strokes, heterozygous prothrombin gene mutation on Eliquis , insulin -dependent type 2 diabetes  Risk Factors: tobacco use       History of Heterozygous Prothrombin Gene Mutation:   Current medications: Eliquis  5 mg twice daily - reports last taken ~1 week ago       Objective:  Lab Results  Component Value Date   HGBA1C 6.8 (H) 11/17/2023    Lab Results  Component Value Date   CREATININE 0.79 11/17/2023   BUN 14 11/17/2023   NA 140 11/17/2023   K 4.8 11/17/2023   CL 103 11/17/2023   CO2 28 11/17/2023    Lab Results  Component Value Date   CHOL 113 11/17/2023   HDL 36 (L) 11/17/2023   LDLCALC 55 11/17/2023   LDLDIRECT 126.0 06/20/2017   TRIG 135 11/17/2023   CHOLHDL 3.1 11/17/2023    Medications Reviewed Today  Reviewed by Alana Sharyle LABOR, RPH-CPP (Pharmacist) on 01/03/24 at 1619  Med List Status: <None>   Medication Order Taking? Sig Documenting Provider Last Dose Status Informant  Accu-Chek Softclix Lancets lancets [id number]  Use to check blood sugar three times a day for type 2 diabetes E11.9 [name] ORN, NP  Active   apixaban  (ELIQUIS ) 5 MG TABS tablet [id number]  Take 1 tablet (5 mg total) by mouth 2 (two) times daily. [name] ORN, NP  Active   aspirin  EC 81 MG  tablet [id number]  Take 1 tablet (81 mg total) by mouth daily. [name] ORN, NP  Active Self  Blood Glucose Monitoring Suppl (ACCU-CHEK GUIDE ME) w/Device KIT [id number]  Use to check blood sugar three times a day [name] ORN, NP  Active   Continuous Glucose Sensor (FREESTYLE LIBRE 3 PLUS SENSOR) [place] [id number]  Change sensor every 15 days. [name] ORN, NP  Active   glucose blood (ACCU-CHEK GUIDE TEST) test strip [id number]  Use to check blood sugar three times a day [name] ORN, NP  Active   insulin  glargine (LANTUS  SOLOSTAR) 100 UNIT/ML Solostar Pen [id number] Yes Inject 16 Units into the skin daily. [name] ORN, NP  Active   Insulin  Pen Needle 31G X 5 MM MISC [id number]  BD Pen Needles- brand specific Inject insulin  via insulin  pen 6 x daily [name] ORN, NP  Active   losartan  (COZAAR ) 50 MG tablet [id number] Yes Take 1 tablet (50 mg total) by mouth daily. [name] ORN, NP  Active   metFORMIN  (GLUCOPHAGE ) 1000 MG tablet [id number] Yes Take 0.5 tablets (500 mg total) by mouth 2 (two) times daily with a meal. [name] ORN, NP  Active   methocarbamol  (ROBAXIN ) 500 MG tablet [id number]   [provider]  Active   Omega-3 Fatty Acids (FISH OIL ) 1200 MG CAPS [id number]  Take 1,200 mg by mouth. [provider]  Active   OZEMPIC , 0.25 OR 0.5 MG/DOSE, 2 MG/3ML SOPN [id number]  Inject 0.5 mg into the skin once a week. [name] ORN, NP  Active   rosuvastatin  (CRESTOR ) 20 MG tablet [id number]  Take 1 tablet (20 mg total) by mouth daily. [name] ORN, NP  Active   sertraline (ZOLOFT) 50 MG tablet [id number]  Take 0.5 tablets (25 mg total) by mouth daily for 14 days, THEN 1 tablet (50 mg total) daily. [name] ORN, NP  Active   sildenafil  (VIAGRA ) 50 MG tablet [id number]  TAKE 1 TABLET(50 MG) BY MOUTH DAILY AS NEEDED FOR ERECTILE DYSFUNCTION [name] ORN, NP  Active               Assessment/Plan:   Outreach to [employer] today on behalf of patient.   - Provide [facility] with billing info for [facility] Medicaid Free BIN Meter program. EBONY [id number], PCN [id number], Group [id number], ID [id number] - confirms goes through [facility] for patient - Request pharmacy re-run Ozempic  prescription, as PA completed & approved - confirms now going through [facility] for patient  Patient to contact [facility] to pick up prescriptions for new Accu Chek meter (has the supplies at home), Ozempic , rosuvastatin  and sertraline      Diabetes: - Have reviewed dietary modifications including working toward having regular well-balanced meals spread throughout the day, while controlling carbohydrate portion sizes - Have counseled patient on s/s of low blood sugar and how to treat lows - Again provide patient with phone number for [employer] Support at [phone]. Their customer  support is available 7 days a week 8 am to 8 pm  Patient to call [employer] support for help with resetting his password in App - Recommend to start Freestyle Libre 3 CGM to monitor blood sugar/as feedback on dietary choices Recommend to check glucose with fingerstick check when needed for symptoms and as back up to CGM.  Patient to contact office if needed for readings outside of established parameters or symptoms   History of Heterozygous Prothrombin Gene Mutation: - Reviewed importance of adherence to anticoagulant         Follow Up Plan: Clinical Pharmacist will follow up with patient by telephone on 01/17/2024 at 8:30 AM    Sharyle Sia, PharmD, [facility] Clinical Pharmacist [facility] [phone]

## 2024-01-03 NOTE — Patient Instructions (Signed)
Goals Addressed             This Visit's Progress    Pharmacy Goals       Our goal A1c is less than 7%. This corresponds with fasting sugars less than 130 and 2 hour after meal sugars less than 180. Please keep a log of your results when checking your blood sugar   Our goal bad cholesterol, or LDL, is less than 70 . This is why it is important to continue taking your rosuvastatin.  Check your blood pressure twice weekly, and any time you have concerning symptoms like headache, chest pain, dizziness, shortness of breath, or vision changes.   Our goal is less than 130/80.  To appropriately check your blood pressure, make sure you do the following:  1) Avoid caffeine, exercise, or tobacco products for 30 minutes before checking. Empty your bladder. 2) Sit with your back supported in a flat-backed chair. Rest your arm on something flat (arm of the chair, table, etc). 3) Sit still with your feet flat on the floor, resting, for at least 5 minutes.  4) Check your blood pressure. Take 1-2 readings.  5) Write down these readings and bring with you to any provider appointments.  Bring your home blood pressure machine with you to a provider's office for accuracy comparison at least once a year.   Make sure you take your blood pressure medications before you come to any office visit, even if you were asked to fast for labs.   Estelle Grumbles, PharmD, [facility] Clinical Pharmacist [facility] [phone]

## 2024-01-17 ENCOUNTER — Telehealth: Payer: Self-pay | Admitting: Pharmacist

## 2024-01-17 ENCOUNTER — Other Ambulatory Visit

## 2024-01-17 MED ORDER — NICOTINE 21 MG/24HR TD PT24: 21.0000 mg | MEDICATED_PATCH | Freq: Every day | TRANSDERMAL | 0 refills | Status: AC

## 2024-01-17 NOTE — Addendum Note (Signed)
 Addended by: [name] ORN on: 01/17/2024 11:28 AM   Modules accepted: Orders

## 2024-01-17 NOTE — Progress Notes (Signed)
   Outreach Note  01/17/2024 Name: Martin Mullen MRN: [id number] DOB: [birth year]  Referred by: [name] ORN, NP  Was unable to reach patient via telephone today and have left HIPAA compliant voicemail asking patient to return my call.    Follow Up Plan: Will collaborate with Care Guide to outreach to schedule follow up with me  Sharyle Sia, PharmD, [facility] Clinical Pharmacist [facility] [phone]

## 2024-01-19 ENCOUNTER — Other Ambulatory Visit

## 2024-01-19 DIAGNOSIS — Z794 Long term (current) use of insulin: Secondary | ICD-10-CM

## 2024-01-19 DIAGNOSIS — Z7985 Long-term (current) use of injectable non-insulin antidiabetic drugs: Secondary | ICD-10-CM

## 2024-01-19 DIAGNOSIS — E1142 Type 2 diabetes mellitus with diabetic polyneuropathy: Secondary | ICD-10-CM

## 2024-01-19 DIAGNOSIS — I2510 Atherosclerotic heart disease of native coronary artery without angina pectoris: Secondary | ICD-10-CM

## 2024-01-19 DIAGNOSIS — Z7984 Long term (current) use of oral hypoglycemic drugs: Secondary | ICD-10-CM

## 2024-01-19 NOTE — Patient Instructions (Signed)
Goals Addressed             This Visit's Progress    Pharmacy Goals       Our goal A1c is less than 7%. This corresponds with fasting sugars less than 130 and 2 hour after meal sugars less than 180. Please keep a log of your results when checking your blood sugar   Our goal bad cholesterol, or LDL, is less than 70 . This is why it is important to continue taking your rosuvastatin.  Check your blood pressure twice weekly, and any time you have concerning symptoms like headache, chest pain, dizziness, shortness of breath, or vision changes.   Our goal is less than 130/80.  To appropriately check your blood pressure, make sure you do the following:  1) Avoid caffeine, exercise, or tobacco products for 30 minutes before checking. Empty your bladder. 2) Sit with your back supported in a flat-backed chair. Rest your arm on something flat (arm of the chair, table, etc). 3) Sit still with your feet flat on the floor, resting, for at least 5 minutes.  4) Check your blood pressure. Take 1-2 readings.  5) Write down these readings and bring with you to any provider appointments.  Bring your home blood pressure machine with you to a provider's office for accuracy comparison at least once a year.   Make sure you take your blood pressure medications before you come to any office visit, even if you were asked to fast for labs.   Estelle Grumbles, PharmD, [facility] Clinical Pharmacist [facility] [phone]

## 2024-01-19 NOTE — Progress Notes (Signed)
 01/19/2024 Name: Martin Mullen MRN: [id number] DOB: [birth year]  Chief Complaint  Patient presents with   Medication Management   Medication Adherence    Martin Mullen is a 43 y.o. year old male who presented for a telephone visit.   They were referred to the pharmacist by their PCP for assistance in managing medication access.      Subjective:   Care Team: Primary Care Provider: [name] ORN, NP ; Next Scheduled Visit: 05/14/2024    Medication Access/Adherence  Current Pharmacy:  [facility] #[id number] [address], [place] - [address] AT [facility] [address] [place] [address] Phone: [phone] Fax: [phone]  [name] [address] [place] [address] Phone: [phone] Fax: [phone]  [facility] - [facility] [address] [place] [address] Phone: [phone] Fax: [phone]   Patient reports affordability concerns with their medications: Yes  Patient reports access/transportation concerns to their pharmacy: No  Patient reports adherence concerns with their medications:  No      Diabetes:   Current medications:  - metformin  1000 mg - 1/2 tablet (500 mg) twice daily - Lantus  16 units daily - Ozempic  0.5 mg weekly    Medications tried in the past/contraindications: glipizide , Januvia ; note would avoid SGLT2-inhibitor as patient with history of Fournier gangrene   Picked up, but has not yet started using Freestyle Libre continuous glucose monitoring as forgot his password for the App  Patient has Accu Chek Guide meter Denies checking home blood sugar since last week and recalls reading was ~135 before breakfast - Checks today during our call: 229 - after breakfast (bowl of cereal)   Patient shares that he currently lives in an assisted living facility and so it is sometimes difficult to control his diet  Patient denies  hypoglycemic s/sx including dizziness, shakiness, sweating         Hyperlipidemia/ASCVD Risk Reduction   Current lipid lowering medications: rosuvastatin  20 mg daily - not yet restarted (did not pick up from pharmacy)   Antiplatelet regimen: aspirin  81 mg (note patient also on Eliquis  5 mg twice daily)   ASCVD History: medical history significant of STEMI s/p RCA stent 2014, hypertension, recurrent strokes, heterozygous prothrombin gene mutation on Eliquis , insulin -dependent type 2 diabetes  Risk Factors: tobacco use       History of Heterozygous Prothrombin Gene Mutation:   Current medications: Eliquis  5 mg twice daily     Objective:  Lab Results  Component Value Date   HGBA1C 6.8 (H) 11/17/2023    Lab Results  Component Value Date   CREATININE 0.79 11/17/2023   BUN 14 11/17/2023   NA 140 11/17/2023   K 4.8 11/17/2023   CL 103 11/17/2023   CO2 28 11/17/2023    Lab Results  Component Value Date   CHOL 113 11/17/2023   HDL 36 (L) 11/17/2023   LDLCALC 55 11/17/2023   LDLDIRECT 126.0 06/20/2017   TRIG 135 11/17/2023   CHOLHDL 3.1 11/17/2023    Medications Reviewed Today     Reviewed by Alana Sharyle LABOR, RPH-CPP (Pharmacist) on 01/19/24 at 1111  Med List Status: <None>   Medication Order Taking? Sig Documenting Provider Last Dose Status Informant  Accu-Chek Softclix Lancets lancets [id number]  Use to check blood sugar three times a day for type 2 diabetes E11.9 [name] ORN, NP  Active   apixaban  (ELIQUIS ) 5 MG TABS  tablet [id number] Yes Take 1 tablet (5 mg total) by mouth 2 (two) times daily. [name] ORN, NP  Active   aspirin  EC 81 MG tablet [id number]  Take 1 tablet (81 mg total) by mouth daily. [name] ORN, NP  Active Self  Blood Glucose Monitoring Suppl (ACCU-CHEK GUIDE ME) w/Device KIT [id number]  Use to check blood sugar three times a day [name] ORN, NP  Active   Continuous Glucose Sensor (FREESTYLE LIBRE 3 PLUS SENSOR) [place] [id number]  Change  sensor every 15 days. [name] ORN, NP  Active   glucose blood (ACCU-CHEK GUIDE TEST) test strip [id number]  Use to check blood sugar three times a day [name] ORN, NP  Active   insulin  glargine (LANTUS  SOLOSTAR) 100 UNIT/ML Solostar Pen [id number] Yes Inject 16 Units into the skin daily. [name] ORN, NP  Active   Insulin  Pen Needle 31G X 5 MM MISC [id number]  BD Pen Needles- brand specific Inject insulin  via insulin  pen 6 x daily [name] ORN, NP  Active   losartan  (COZAAR ) 50 MG tablet [id number] Yes Take 1 tablet (50 mg total) by mouth daily. [name] ORN, NP  Active   metFORMIN  (GLUCOPHAGE ) 1000 MG tablet [id number] Yes Take 0.5 tablets (500 mg total) by mouth 2 (two) times daily with a meal. [name] ORN, NP  Active   methocarbamol  (ROBAXIN ) 500 MG tablet [id number]   [provider]  Active   nicotine  (NICODERM CQ ) 21 mg/24hr patch [id number]  Place 1 patch (21 mg total) onto the skin daily. [name] ORN, NP  Active   Omega-3 Fatty Acids (FISH OIL ) 1200 MG CAPS [id number]  Take 1,200 mg by mouth. [provider]  Active   OZEMPIC , 0.25 OR 0.5 MG/DOSE, 2 MG/3ML SOPN [id number] Yes Inject 0.5 mg into the skin once a week. [name] ORN, NP  Active   rosuvastatin  (CRESTOR ) 20 MG tablet [id number]  Take 1 tablet (20 mg total) by mouth daily. [name] ORN, NP  Active   sertraline  (ZOLOFT ) 50 MG tablet [id number] Yes Take 0.5 tablets (25 mg total) by mouth daily for 14 days, THEN 1 tablet (50 mg total) daily. [name] ORN, NP  Active   sildenafil  (VIAGRA ) 50 MG tablet [id number]  TAKE 1 TABLET(50 MG) BY MOUTH DAILY AS NEEDED FOR ERECTILE DYSFUNCTION [name] ORN, NP  Active               Assessment/Plan:   Comprehensive medication review performed; medication list updated in electronic medical record  [facility] to pick up prescription for rosuvastatin  and restart taking   Outreach to [employer] on behalf of  patient today and request pharmacy refill rosuvastatin  prescription for patient   Diabetes: - Have reviewed dietary modifications including working toward having regular well-balanced meals spread throughout the day, while controlling carbohydrate portion sizes - Have counseled patient on s/s of low blood sugar and how to treat lows - Again provide patient with phone number for My[employer] Support at [phone]. Their customer support is available 7 days a week 8 am to 8 pm             Patient to call My[employer] support for help with resetting his password in App - Recommend to start Freestyle Libre 3 CGM to monitor blood sugar/as feedback on dietary choices Recommend to check glucose with fingerstick check when needed for symptoms and as back up to CGM.  Patient to contact office  if needed for readings outside of established parameters or symptoms   History of Heterozygous Prothrombin Gene Mutation: - Reviewed importance of adherence to anticoagulant       Follow Up Plan: Clinical Pharmacist will follow up with patient by telephone on 02/23/2024 at 10:00 AM    Sharyle Sia, PharmD, [facility] Clinical Pharmacist [facility] [phone]

## 2024-02-16 ENCOUNTER — Other Ambulatory Visit

## 2024-02-16 ENCOUNTER — Telehealth: Payer: Self-pay | Admitting: Pharmacist

## 2024-02-16 NOTE — Progress Notes (Signed)
" ° °  Outreach Note  02/16/2024 Name: Martin Mullen MRN: [id number] DOB: 1980-06-17  Referred by: [name] ORN, NP  Was unable to reach patient via telephone today and have left HIPAA compliant voicemail asking patient to return my call.    Follow Up Plan: Will collaborate with Care Guide to outreach to schedule follow up with me  Sharyle Sia, PharmD, [facility] Clinical Pharmacist [facility] [phone]    "

## 2024-02-23 ENCOUNTER — Other Ambulatory Visit

## 2024-03-04 ENCOUNTER — Telehealth: Payer: Self-pay

## 2024-03-04 NOTE — Progress Notes (Signed)
 Complex Care Management Care Guide Note  03/04/2024 Name: Martin Mullen MRN: [id number] DOB: 12-16-1980  Martin Mullen is a 44 y.o. year old male who is a primary care patient of Antonette, Angeline ORN, NP and is actively engaged with the care management team. I reached out to Martin Mullen by phone today to assist with re-scheduling  with the Pharmacist.  Follow up plan: Unsuccessful telephone outreach attempt made. A HIPAA compliant phone message was left for the patient providing contact information and requesting a return call.  Martin Mullen , RMA     [facility]  [facility] Guide  Direct Dial: [phone]  Website: [email or web address]

## 2024-03-06 ENCOUNTER — Ambulatory Visit: Admitting: Internal Medicine

## 2024-03-06 NOTE — Progress Notes (Unsigned)
 "  Virtual Visit via Video Note  I connected with Martin Mullen on 03/06/24 at  9:20 AM EST by a video enabled telemedicine application and verified that I am speaking with the correct person using two identifiers.  Location: Patient: *** Provider: Office  Person's participating in this video call: Martin Laura, NP-C and Romy Mcgue   I discussed the limitations of evaluation and management by telemedicine and the availability of in person appointments. The patient expressed understanding and agreed to proceed.  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   Chicken pox    Coronary artery disease    Inferior ST elevation myocardial infarction in July of 2014. Cardiac catheterization showed 95% distal RCA stenosis and 90% first diagonal stenosis with normal ejection fraction. He underwent PCI in bare-metal stent placement to the distal RCA.   Diabetes mellitus without complication (HCC)    Hypercholesterolemia    Stroke (HCC)    Tobacco use     Current Outpatient Medications  Medication Sig Dispense Refill   Accu-Chek Softclix Lancets lancets Use to check blood sugar three times a day for type 2 diabetes E11.9 200 each 3   apixaban  (ELIQUIS ) 5 MG TABS tablet Take 1 tablet (5 mg total) by mouth 2 (two) times daily. 180 tablet 0   aspirin  EC 81 MG tablet Take 1 tablet (81 mg total) by mouth daily. 90 tablet 0   Blood Glucose Monitoring Suppl (ACCU-CHEK GUIDE ME) w/Device KIT Use to check blood sugar three times a day 1 kit 0   Continuous Glucose Sensor (FREESTYLE LIBRE 3 PLUS SENSOR) MISC Change sensor every 15 days. 6 each 1   glucose blood (ACCU-CHEK GUIDE TEST) test strip Use to check blood sugar three times a day 200 each 3   insulin  glargine (LANTUS  SOLOSTAR) 100 UNIT/ML Solostar Pen Inject 16 Units into the skin daily. 15 mL 1   Insulin  Pen Needle 31G X 5 MM  MISC BD Pen Needles- brand specific Inject insulin  via insulin  pen 6 x daily 100 each 1   losartan  (COZAAR ) 50 MG tablet Take 1 tablet (50 mg total) by mouth daily. 90 tablet 1   metFORMIN  (GLUCOPHAGE ) 1000 MG tablet Take 0.5 tablets (500 mg total) by mouth 2 (two) times daily with a meal. 180 tablet 1   methocarbamol  (ROBAXIN ) 500 MG tablet      nicotine  (NICODERM CQ ) 21 mg/24hr patch Place 1 patch (21 mg total) onto the skin daily. 28 patch 0   Omega-3 Fatty Acids (FISH OIL ) 1200 MG CAPS Take 1,200 mg by mouth.     OZEMPIC , 0.25 OR 0.5 MG/DOSE, 2 MG/3ML SOPN Inject 0.5 mg into the skin once a week. 9 mL 1   rosuvastatin  (CRESTOR ) 20 MG tablet Take 1 tablet (20 mg total) by mouth daily. 90 tablet 0   sertraline  (ZOLOFT ) 50 MG tablet Take 0.5 tablets (25 mg total) by mouth daily for 14 days, THEN 1 tablet (50 mg total) daily. 83 tablet 0   sildenafil  (VIAGRA ) 50 MG tablet TAKE 1 TABLET(50 MG) BY MOUTH DAILY AS NEEDED FOR ERECTILE DYSFUNCTION 10 tablet 2   No current facility-administered medications for this visit.    Allergies[1]  Family History  Problem Relation Age of Onset   Arthritis Mother        Rheumatoid   Diabetes Mother  Hyperlipidemia Mother    Diabetes Father    Cancer Maternal Grandfather        Prostate   Parkinson's disease Maternal Grandfather     Social History   Socioeconomic History   Marital status: Divorced    Spouse name: Not on file   Number of children: 2   Years of education: Not on file   Highest education level: Not on file  Occupational History   Not on file  Tobacco Use   Smoking status: Every Day    Current packs/day: 1.00    Average packs/day: 1 pack/day for 30.0 years (30.0 ttl pk-yrs)    Types: Cigarettes   Smokeless tobacco: Never  Vaping Use   Vaping status: Never Used  Substance and Sexual Activity   Alcohol use: Not Currently    Alcohol/week: 0.0 standard drinks of alcohol    Comment: occasional   Drug use: Not Currently     Types: Marijuana   Sexual activity: Yes    Birth control/protection: None  Other Topics Concern   Not on file  Social History Narrative   Lives with girlfriend and her mother    Social Drivers of Health   Tobacco Use: High Risk (12/29/2023)   Patient History    Smoking Tobacco Use: Every Day    Smokeless Tobacco Use: Never    Passive Exposure: Not on file  Financial Resource Strain: Low Risk (06/10/2022)   Overall Financial Resource Strain (CARDIA)    Difficulty of Paying Living Expenses: Not very hard  Food Insecurity: No Food Insecurity (09/22/2022)   Hunger Vital Sign    Worried About Running Out of Food in the Last Year: Never true    Ran Out of Food in the Last Year: Never true  Transportation Needs: No Transportation Needs (09/22/2022)   PRAPARE - Administrator, Civil Service (Medical): No    Lack of Transportation (Non-Medical): No  Physical Activity: Not on file  Stress: Not on file  Social Connections: Not on file  Intimate Partner Violence: Not At Risk (09/22/2022)   Humiliation, Afraid, Rape, and Kick questionnaire    Fear of Current or Ex-Partner: No    Emotionally Abused: No    Physically Abused: No    Sexually Abused: No  Depression (PHQ2-9): Medium Risk (11/17/2023)   Depression (PHQ2-9)    PHQ-2 Score: 9  Alcohol Screen: Not on file  Housing: Low Risk (09/22/2022)   Housing    Last Housing Risk Score: 0  Utilities: Not At Risk (09/22/2022)   AHC Utilities    Threatened with loss of utilities: No  Health Literacy: Not on file     Constitutional: Denies fever, malaise, fatigue, headache or abrupt weight changes.  HEENT: Denies eye pain, eye redness, ear pain, ringing in the ears, wax buildup, runny nose, nasal congestion, bloody nose, or sore throat. Respiratory: Denies difficulty breathing, shortness of breath, cough or sputum production.   Cardiovascular: Denies chest pain, chest tightness, palpitations or swelling in the hands or feet.   Gastrointestinal: Denies abdominal pain, bloating, constipation, diarrhea or blood in the stool.  GU: Denies urgency, frequency, pain with urination, burning sensation, blood in urine, odor or discharge. Musculoskeletal: Patient reports absence of RLE, difficulty with gait.  Denies decrease in range of motion, muscle pain or joint pain and swelling.  Skin: Denies redness, rashes, lesions or ulcercations.  Neurological: Patient reports difficulty with speech, problems with balance.  Denies dizziness, difficulty with memory,  or problems with coordination.  Psych: Patient has a history of anxiety and depression.  Denies SI/HI.   No other specific complaints in a complete review of systems (except as listed in HPI above).  Observations/Objective:  There were no vitals taken for this visit. Wt Readings from Last 3 Encounters:  11/17/23 185 lb (83.9 kg)  09/20/22 169 lb 1.5 oz (76.7 kg)  08/11/22 190 lb (86.2 kg)    General: Appears their stated age, well developed, well nourished in NAD. Skin: Warm, dry and intact. No rashes, lesions or ulcerations noted. HEENT: Head: normal shape and size; Eyes: sclera white, no icterus, conjunctiva pink, PERRLA and EOMs intact; Ears: Tm's gray and intact, normal light reflex; Nose: mucosa pink and moist, septum midline; Throat/Mouth: Teeth present, mucosa pink and moist, no exudate, lesions or ulcerations noted.  Neck:  Neck supple, trachea midline. No masses, lumps or thyromegaly present.  Cardiovascular: Normal rate and rhythm. S1,S2 noted.  No murmur, rubs or gallops noted. No JVD or BLE edema. No carotid bruits noted. Pulmonary/Chest: Normal effort and positive vesicular breath sounds. No respiratory distress. No wheezes, rales or ronchi noted.  Abdomen: Soft and nontender. Normal bowel sounds. No distention or masses noted. Liver, spleen and kidneys non palpable. Musculoskeletal: Normal range of motion. No signs of joint swelling. No difficulty with  gait.  Neurological: Alert and oriented. Cranial nerves II-XII grossly intact. Coordination normal.  Psychiatric: Mood and affect normal. Behavior is normal. Judgment and thought content normal.     BMET    Component Value Date/Time   NA 140 11/17/2023 1324   NA 140 08/11/2021 0937   NA 139 01/29/2013 0040   K 4.8 11/17/2023 1324   K 4.0 01/29/2013 0040   CL 103 11/17/2023 1324   CL 107 01/29/2013 0040   CO2 28 11/17/2023 1324   CO2 28 01/29/2013 0040   GLUCOSE 141 (H) 11/17/2023 1324   GLUCOSE 228 (H) 01/29/2013 0040   BUN 14 11/17/2023 1324   BUN 11 08/11/2021 0937   BUN 15 01/29/2013 0040   CREATININE 0.79 11/17/2023 1324   CALCIUM  9.7 11/17/2023 1324   CALCIUM  9.2 01/29/2013 0040   GFRNONAA >60 09/28/2022 0513   GFRNONAA >60 01/29/2013 0040   GFRAA 104 11/13/2019 1550   GFRAA >60 01/29/2013 0040    Lipid Panel     Component Value Date/Time   CHOL 113 11/17/2023 1324   CHOL 159 08/11/2021 0937   TRIG 135 11/17/2023 1324   HDL 36 (L) 11/17/2023 1324   HDL 26 (L) 08/11/2021 0937   CHOLHDL 3.1 11/17/2023 1324   VLDL 22 09/12/2021 0419   LDLCALC 55 11/17/2023 1324    CBC    Component Value Date/Time   WBC 11.9 (H) 11/17/2023 1324   RBC 4.76 11/17/2023 1324   HGB 15.3 11/17/2023 1324   HGB 17.9 (H) 08/11/2021 0937   HCT 46.7 11/17/2023 1324   HCT 51.0 08/11/2021 0937   PLT 239 11/17/2023 1324   PLT 261 08/11/2021 0937   MCV 98.1 11/17/2023 1324   MCV 93 08/11/2021 0937   MCV 93 01/29/2013 0040   MCH 32.1 11/17/2023 1324   MCHC 32.8 11/17/2023 1324   RDW 13.1 11/17/2023 1324   RDW 12.4 08/11/2021 0937   RDW 13.8 01/29/2013 0040   LYMPHSABS 1.8 09/16/2022 1618   LYMPHSABS 4.8 (H) 08/11/2021 0937   MONOABS 0.7 09/16/2022 1618   EOSABS 0.1 09/16/2022 1618   EOSABS 0.5 (H) 08/11/2021 0937   BASOSABS 0.1 09/16/2022 1618  BASOSABS 0.1 08/11/2021 0937    Hgb A1C Lab Results  Component Value Date   HGBA1C 6.8 (H) 11/17/2023       Assessment and  Plan:  RTC in 3 months for follow-up of chronic conditions  Follow Up Instructions:    I discussed the assessment and treatment plan with the patient. The patient was provided an opportunity to ask questions and all were answered. The patient agreed with the plan and demonstrated an understanding of the instructions.   The patient was advised to call back or seek an in-person evaluation if the symptoms worsen or if the condition fails to improve as anticipated.   Martin Laura, NP     [1] No Known Allergies  "

## 2024-03-08 ENCOUNTER — Telehealth: Admitting: Internal Medicine

## 2024-03-08 ENCOUNTER — Encounter: Payer: Self-pay | Admitting: Internal Medicine

## 2024-03-08 DIAGNOSIS — I639 Cerebral infarction, unspecified: Secondary | ICD-10-CM

## 2024-03-08 DIAGNOSIS — Z89611 Acquired absence of right leg above knee: Secondary | ICD-10-CM

## 2024-03-08 DIAGNOSIS — Z0289 Encounter for other administrative examinations: Secondary | ICD-10-CM

## 2024-03-08 NOTE — Progress Notes (Signed)
 Complex Care Management Care Guide Note  03/08/2024 Name: Martin Mullen MRN: [id number] DOB: 1981/01/14  Martin Mullen is a 44 y.o. year old male who is a primary care patient of Antonette, Angeline ORN, NP and is actively engaged with the care management team. I reached out to [name] by phone today to assist with re-scheduling  with the Pharmacist.  Follow up plan: Unsuccessful telephone outreach attempt made. A HIPAA compliant phone message was left for the patient providing contact information and requesting a return call.  Jeoffrey Buffalo , RMA     [facility]  [facility], [facility] Guide  Direct Dial: [phone]  Website: [email or web address]

## 2024-03-08 NOTE — Patient Instructions (Signed)
 Living With an Amputation An amputation is a surgical procedure to remove all or part of an arm or leg (limb). After this surgery, it will take time for you to heal and get used to living with the amputation. There are things you can do to help yourself adjust. Living with an amputation can be challenging, but it is often possible to do all of the activities that you used to do. How to manage lifestyle changes     Return to your normal activities as told by your health care provider. Ask your health care provider what activities are safe for you. You may choose to have an artificial limb (prosthesis) made for you. Use and care for your prosthesis as told by your health care provider. Discuss your leisure interests with your health care provider and prosthesis specialist. Equipment changes can often be made, which may allow you to return to a sport or hobby. Some designer, jewellery for this purpose. Talk with your health care team about returning to work. When you are ready to return to work, your therapists can evaluate your job site and make recommendations to help you do your job. If you are not able to return to the same job, Cytogeneticist of Vocational Rehabilitation can help you with job retraining. Talk with your health care provider about returning to driving. You may: Work with an occupational therapist to learn new ways to safely drive with an amputation. Work with a prosthesis specialist or physical therapist to identify assistive devices for safe driving. Challenges of living with an amputation Some common challenges of living with an amputation may cause stress. These issues are normal. They include: Changes in how you move around (mobility). Changes in how you care for yourself. Changes in how you participate in leisure activities. Emotions after the amputation, such as grief and anger. Issues with body image and weight. Problems with pain and treatment. How  to manage stress Use these strategies to ease stress related to the daily challenges of living with an amputation: Be aware of any sources of your stress. Monitor yourself for symptoms of stress. Identify what challenges cause the most stress for you. Rethink the situation. Try to: Think realistically about stressful challenges. Do not ignore these challenges. Do not overreact to them. Understand what you would like to change about the situation and how you can set goals in that direction. Connect with other people who have gone through the same experience. Find ways to help relax your body and mind. These may include: Meditation, deep breathing, or progressive relaxation techniques. Hypnosis or guided imagery. Yoga or tai chi. Biofeedback or mindfulness techniques. Keeping a journal. Doing hobbies, like listening to music or being out in nature. Exercise. Talk with your rehabilitation team about the best way to get 30 minutes of physical activity at least 5 days a week. Where to find support: Talking to others Look for a local or online support group for people living with an amputation. Talk about your emotional challenges, such as grief, with a mental health professional. Kela Talk to your insurance company about what assistive devices your plan covers. Contact national or local amputee charities to find out if you are eligible for scholarships and medical equipment for people in need. People in [employer] service may be eligible for financial assistance or programs that support amputees. Rehabilitation A rehabilitation program can help you regain mobility and independence. Your rehabilitation team may include: Physicians and nurses. Physical and occupational therapists.  Prosthesis specialists. Social workers. Mental health professionals. Diet and nutrition specialists. Follow these instructions at home: Managing pain Work with your health care provider to manage any residual  pain or pain in a body part that no longer exists (phantom limb pain). This may include: Pain medicine. Physical therapy. Techniques that help retrain the brain and nervous system (movement representation techniques). Sensory discrimination training. For this treatment, stimulation is applied to different parts of your stump, and you describe what you feel. Biofeedback. This involves using monitors that alert you to changes in your breathing, heart rate, skin temperature, or muscle activity, and using relaxation techniques to reverse those changes. Acupuncture. This involves inserting small needles into certain places on your skin to help relieve pain. Eating and drinking Work with a dietitian to develop an eating plan that helps you maintain a healthy body weight. Your plan may include a balance of: Fresh fruits and vegetables. Whole grains. Lean meats. Low-fat dairy. Healthy fats, such as fish, nuts, avocado, or olive oil. Do not drink alcohol to cope with stress. General instructions Take over-the-counter and prescription medicines only as told by your health care provider. Do not use any products that contain nicotine  or tobacco. These products include cigarettes, chewing tobacco, and vaping devices, such as e-cigarettes. If you need help quitting, ask your health care provider. Keep all follow-up visits. This is important. Where to find more information You can find more information about living with an amputation from: Amputee Coalition: www.amputee-coalition.org Amputee Support Groups: amputee.supportgroups.[facility] on Health, Physical Activity and Disability: www.nchpad.org Move United: move[place].org Contact a health care provider if: Your pain does not improve with medicine or treatment. You have trouble managing your emotions about losing an extremity. Get help right away if: You have severe pain. You have thoughts of harming yourself or others. If you ever  feel like you may hurt yourself or others, or have thoughts about taking your own life, get help right away. Go to your nearest emergency department or: Call your local emergency services (911 in the [place] Call a suicide crisis helpline, such as the National Suicide Prevention Lifeline at 339-499-5162 or 988 in the [place] This is open 24 hours a day in the [place] If youre a Veteran: Call 988 and press 1. This is open 24 hours a day. Text the [employer] at 619-703-3225. Summary It will take time for you to heal and get used to living with an amputation. Look for a local or online support group for people living with an amputation. Work with your health care provider to manage any phantom limb pain. This information is not intended to replace advice given to you by your health care provider. Make sure you discuss any questions you have with your health care provider. Document Revised: 09/15/2022 Document Reviewed: 06/18/2020 Elsevier Patient Education  2025 [employer].

## 2024-03-08 NOTE — Progress Notes (Signed)
 "  Virtual Visit via Video Note  I connected with Martin Mullen on 03/06/24 at  9:20 AM EST by a video enabled telemedicine application and verified that I am speaking with the correct person using two identifiers.  Location: Patient: Home Provider: Office  Person's participating in this video call: [name], NP-C and Ab Leaming   I discussed the limitations of evaluation and management by telemedicine and the availability of in person appointments. The patient expressed understanding and agreed to proceed.  History of Present Illness:   Discussed the use of AI scribe software for clinical note transcription with the patient, who gave verbal consent to proceed.  Martin Mullen is a 44 year old male who presents requesting form completion regarding personal care services.  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  He is requesting personal care services during the day, does not need 24-hour care.  He needs assistant's with all ADLs including bathing, dressing, toileting and transfer to bed.      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   Chicken pox    Coronary artery disease    Inferior ST elevation myocardial infarction in July of 2014. Cardiac catheterization showed 95% distal RCA stenosis and 90% first diagonal stenosis with normal ejection fraction. He underwent PCI in bare-metal stent placement to the distal RCA.   Diabetes mellitus without complication (HCC)    Hypercholesterolemia    Stroke (HCC)    Tobacco use     Current Outpatient Medications  Medication Sig Dispense Refill   Accu-Chek Softclix Lancets lancets Use to check blood sugar three times a day for type 2 diabetes E11.9 200 each 3   apixaban  (ELIQUIS ) 5 MG TABS tablet Take 1 tablet (5 mg total) by mouth 2 (two) times daily. 180 tablet 0   aspirin  EC 81 MG tablet Take 1 tablet (81 mg  total) by mouth daily. 90 tablet 0   Blood Glucose Monitoring Suppl (ACCU-CHEK GUIDE ME) w/Device KIT Use to check blood sugar three times a day 1 kit 0   Continuous Glucose Sensor (FREESTYLE LIBRE 3 PLUS SENSOR) MISC Change sensor every 15 days. 6 each 1   glucose blood (ACCU-CHEK GUIDE TEST) test strip Use to check blood sugar three times a day 200 each 3   insulin  glargine (LANTUS  SOLOSTAR) 100 UNIT/ML Solostar Pen Inject 16 Units into the skin daily. 15 mL 1   Insulin  Pen Needle 31G X 5 MM MISC BD Pen Needles- brand specific Inject insulin  via insulin  pen 6 x daily 100 each 1   losartan  (COZAAR ) 50 MG tablet Take 1 tablet (50 mg total) by mouth daily. 90 tablet 1   metFORMIN  (GLUCOPHAGE ) 1000 MG tablet Take 0.5 tablets (500 mg total) by mouth 2 (two) times daily with a meal. 180 tablet 1   methocarbamol  (ROBAXIN ) 500 MG tablet      nicotine  (NICODERM CQ ) 21 mg/24hr patch Place 1 patch (21 mg total) onto the skin daily. 28 patch 0   Omega-3 Fatty Acids (FISH OIL ) 1200 MG CAPS Take 1,200 mg by mouth.     OZEMPIC , 0.25 OR 0.5 MG/DOSE, 2 MG/3ML SOPN Inject 0.5 mg into the skin once a week. 9 mL 1   rosuvastatin  (CRESTOR ) 20 MG tablet Take 1 tablet (20 mg total) by mouth daily. 90 tablet 0   sertraline  (ZOLOFT )  50 MG tablet Take 0.5 tablets (25 mg total) by mouth daily for 14 days, THEN 1 tablet (50 mg total) daily. 83 tablet 0   sildenafil  (VIAGRA ) 50 MG tablet TAKE 1 TABLET(50 MG) BY MOUTH DAILY AS NEEDED FOR ERECTILE DYSFUNCTION 10 tablet 2   No current facility-administered medications for this visit.    [Allergies]  [Allergies] No Known Allergies   Family History  Problem Relation Age of Onset   Arthritis Mother        Rheumatoid   Diabetes Mother    Hyperlipidemia Mother    Diabetes Father    Cancer Maternal Grandfather        Prostate   Parkinson's disease Maternal Grandfather     Social History   Socioeconomic History   Marital status: Divorced    Spouse name: Not on  file   Number of children: 2   Years of education: Not on file   Highest education level: Not on file  Occupational History   Not on file  Tobacco Use   Smoking status: Every Day    Current packs/day: 1.00    Average packs/day: 1 pack/day for 30.0 years (30.0 ttl pk-yrs)    Types: Cigarettes   Smokeless tobacco: Never  Vaping Use   Vaping status: Never Used  Substance and Sexual Activity   Alcohol use: Not Currently    Alcohol/week: 0.0 standard drinks of alcohol    Comment: occasional   Drug use: Not Currently    Types: Marijuana   Sexual activity: Yes    Birth control/protection: None  Other Topics Concern   Not on file  Social History Narrative   Lives with girlfriend and her mother    Social Drivers of Health   Tobacco Use: High Risk (12/29/2023)   Patient History    Smoking Tobacco Use: Every Day    Smokeless Tobacco Use: Never    Passive Exposure: Not on file  Financial Resource Strain: Low Risk (06/10/2022)   Overall Financial Resource Strain (CARDIA)    Difficulty of Paying Living Expenses: Not very hard  Food Insecurity: No Food Insecurity (09/22/2022)   Hunger Vital Sign    Worried About Running Out of Food in the Last Year: Never true    Ran Out of Food in the Last Year: Never true  Transportation Needs: No Transportation Needs (09/22/2022)   PRAPARE - Administrator, Civil Service (Medical): No    Lack of Transportation (Non-Medical): No  Physical Activity: Not on file  Stress: Not on file  Social Connections: Not on file  Intimate Partner Violence: Not At Risk (09/22/2022)   Humiliation, Afraid, Rape, and Kick questionnaire    Fear of Current or Ex-Partner: No    Emotionally Abused: No    Physically Abused: No    Sexually Abused: No  Depression (PHQ2-9): Medium Risk (11/17/2023)   Depression (PHQ2-9)    PHQ-2 Score: 9  Alcohol Screen: Not on file  Housing: Low Risk (09/22/2022)   Housing    Last Housing Risk Score: 0  Utilities: Not At Risk  (09/22/2022)   AHC Utilities    Threatened with loss of utilities: No  Health Literacy: Not on file     Constitutional: Denies fever, malaise, fatigue, headache or abrupt weight changes.  HEENT: Denies eye pain, eye redness, ear pain, ringing in the ears, wax buildup, runny nose, nasal congestion, bloody nose, or sore throat. Respiratory: Denies difficulty breathing, shortness of breath, cough or sputum production.   Cardiovascular:  Denies chest pain, chest tightness, palpitations or swelling in the hands or feet.  Gastrointestinal: Denies abdominal pain, bloating, constipation, diarrhea or blood in the stool.  GU: Denies urgency, frequency, pain with urination, burning sensation, blood in urine, odor or discharge. Musculoskeletal: Patient reports absence of RLE, difficulty with gait.  Denies decrease in range of motion, muscle pain or joint pain and swelling.  Skin: Denies redness, rashes, lesions or ulcercations.  Neurological: Patient reports difficulty with speech, problems with balance.  Denies dizziness, difficulty with memory,  or problems with coordination.  Psych: Patient has a history of anxiety and depression.  Denies SI/HI.   No other specific complaints in a complete review of systems (except as listed in HPI above).  Observations/Objective:  There were no vitals taken for this visit. Wt Readings from Last 3 Encounters:  11/17/23 185 lb (83.9 kg)  09/20/22 169 lb 1.5 oz (76.7 kg)  08/11/22 190 lb (86.2 kg)    General: Appears older than stated age, obese, chronically ill-appearing, in NAD. Pulmonary/Chest: Normal effort. No respiratory distress.  Musculoskeletal: In wheelchair. Neurological: Alert and oriented.    BMET    Component Value Date/Time   NA 140 11/17/2023 1324   NA 140 08/11/2021 0937   NA 139 01/29/2013 0040   K 4.8 11/17/2023 1324   K 4.0 01/29/2013 0040   CL 103 11/17/2023 1324   CL 107 01/29/2013 0040   CO2 28 11/17/2023 1324   CO2 28  01/29/2013 0040   GLUCOSE 141 (H) 11/17/2023 1324   GLUCOSE 228 (H) 01/29/2013 0040   BUN 14 11/17/2023 1324   BUN 11 08/11/2021 0937   BUN 15 01/29/2013 0040   CREATININE 0.79 11/17/2023 1324   CALCIUM  9.7 11/17/2023 1324   CALCIUM  9.2 01/29/2013 0040   GFRNONAA >60 09/28/2022 0513   GFRNONAA >60 01/29/2013 0040   GFRAA 104 11/13/2019 1550   GFRAA >60 01/29/2013 0040    Lipid Panel     Component Value Date/Time   CHOL 113 11/17/2023 1324   CHOL 159 08/11/2021 0937   TRIG 135 11/17/2023 1324   HDL 36 (L) 11/17/2023 1324   HDL 26 (L) 08/11/2021 0937   CHOLHDL 3.1 11/17/2023 1324   VLDL 22 09/12/2021 0419   LDLCALC 55 11/17/2023 1324    CBC    Component Value Date/Time   WBC 11.9 (H) 11/17/2023 1324   RBC 4.76 11/17/2023 1324   HGB 15.3 11/17/2023 1324   HGB 17.9 (H) 08/11/2021 0937   HCT 46.7 11/17/2023 1324   HCT 51.0 08/11/2021 0937   PLT 239 11/17/2023 1324   PLT 261 08/11/2021 0937   MCV 98.1 11/17/2023 1324   MCV 93 08/11/2021 0937   MCV 93 01/29/2013 0040   MCH 32.1 11/17/2023 1324   MCHC 32.8 11/17/2023 1324   RDW 13.1 11/17/2023 1324   RDW 12.4 08/11/2021 0937   RDW 13.8 01/29/2013 0040   LYMPHSABS 1.8 09/16/2022 1618   LYMPHSABS 4.8 (H) 08/11/2021 0937   MONOABS 0.7 09/16/2022 1618   EOSABS 0.1 09/16/2022 1618   EOSABS 0.5 (H) 08/11/2021 0937   BASOSABS 0.1 09/16/2022 1618   BASOSABS 0.1 08/11/2021 0937    Hgb A1C Lab Results  Component Value Date   HGBA1C 6.8 (H) 11/17/2023       Assessment and Plan:  Encounter for form completion of patient for personal care services for recurrent strokes and AKA of right lower extremity:  Form for personal care services filled out and will be  placed upfront for patient's aunt to pick up  RTC in 3 months for follow-up of chronic conditions  Follow Up Instructions:    I discussed the assessment and treatment plan with the patient. The patient was provided an opportunity to ask questions and all were  answered. The patient agreed with the plan and demonstrated an understanding of the instructions.   The patient was advised to call back or seek an in-person evaluation if the symptoms worsen or if the condition fails to improve as anticipated.   [name], NP "

## 2024-05-14 ENCOUNTER — Ambulatory Visit: Admitting: Internal Medicine
